# Patient Record
Sex: Female | Born: 1975
Health system: Southern US, Community
[De-identification: ages and names within clinical notes are randomized; demographics above are authoritative.]

## PROBLEM LIST (undated history)

## (undated) DIAGNOSIS — M5430 Sciatica, unspecified side: Secondary | ICD-10-CM

## (undated) DIAGNOSIS — E041 Nontoxic single thyroid nodule: Secondary | ICD-10-CM

## (undated) DIAGNOSIS — M7541 Impingement syndrome of right shoulder: Secondary | ICD-10-CM

## (undated) DIAGNOSIS — F1410 Cocaine abuse, uncomplicated: Secondary | ICD-10-CM

## (undated) DIAGNOSIS — E119 Type 2 diabetes mellitus without complications: Secondary | ICD-10-CM

## (undated) DIAGNOSIS — I4891 Unspecified atrial fibrillation: Secondary | ICD-10-CM

## (undated) DIAGNOSIS — R45851 Suicidal ideations: Secondary | ICD-10-CM

## (undated) DIAGNOSIS — N12 Tubulo-interstitial nephritis, not specified as acute or chronic: Secondary | ICD-10-CM

## (undated) DIAGNOSIS — G43909 Migraine, unspecified, not intractable, without status migrainosus: Secondary | ICD-10-CM

## (undated) DIAGNOSIS — F32A Depression, unspecified: Secondary | ICD-10-CM

## (undated) DIAGNOSIS — K219 Gastro-esophageal reflux disease without esophagitis: Secondary | ICD-10-CM

## (undated) DIAGNOSIS — I10 Essential (primary) hypertension: Secondary | ICD-10-CM

## (undated) DIAGNOSIS — E785 Hyperlipidemia, unspecified: Secondary | ICD-10-CM

## (undated) DIAGNOSIS — F419 Anxiety disorder, unspecified: Secondary | ICD-10-CM

## (undated) DIAGNOSIS — N186 End stage renal disease: Secondary | ICD-10-CM

## (undated) HISTORY — DX: Anxiety disorder, unspecified: F41.9

## (undated) HISTORY — DX: Sciatica, unspecified side: M54.30

## (undated) HISTORY — DX: Hyperlipidemia, unspecified: E78.5

## (undated) HISTORY — DX: Impingement syndrome of right shoulder: M75.41

## (undated) HISTORY — PX: TUBAL LIGATION: SHX77

## (undated) HISTORY — PX: OTHER SURGICAL HISTORY: SHX169

## (undated) HISTORY — DX: Migraine, unspecified, not intractable, without status migrainosus: G43.909

## (undated) HISTORY — DX: Essential (primary) hypertension: I10

## (undated) HISTORY — DX: Tubulo-interstitial nephritis, not specified as acute or chronic: N12

## (undated) HISTORY — DX: Depression, unspecified: F32.A

## (undated) HISTORY — DX: Gastro-esophageal reflux disease without esophagitis: K21.9

---

## 2013-05-10 DIAGNOSIS — L0501 Pilonidal cyst with abscess: Secondary | ICD-10-CM

## 2013-05-10 HISTORY — DX: Pilonidal cyst with abscess: L05.01

## 2015-03-30 ENCOUNTER — Emergency Department (HOSPITAL_COMMUNITY): Payer: Self-pay

## 2015-03-30 ENCOUNTER — Encounter (HOSPITAL_COMMUNITY): Payer: Self-pay | Admitting: Emergency Medicine

## 2015-03-30 ENCOUNTER — Emergency Department (HOSPITAL_COMMUNITY)
Admission: EM | Admit: 2015-03-30 | Discharge: 2015-03-30 | Disposition: A | Discharge status: Home / Self Care | Payer: Self-pay | Attending: Emergency Medicine | Admitting: Emergency Medicine

## 2015-03-30 DIAGNOSIS — E079 Disorder of thyroid, unspecified: Secondary | ICD-10-CM | POA: Insufficient documentation

## 2015-03-30 DIAGNOSIS — Y9289 Other specified places as the place of occurrence of the external cause: Secondary | ICD-10-CM | POA: Insufficient documentation

## 2015-03-30 DIAGNOSIS — E119 Type 2 diabetes mellitus without complications: Secondary | ICD-10-CM | POA: Insufficient documentation

## 2015-03-30 DIAGNOSIS — Y998 Other external cause status: Secondary | ICD-10-CM | POA: Insufficient documentation

## 2015-03-30 DIAGNOSIS — Z720 Tobacco use: Secondary | ICD-10-CM | POA: Insufficient documentation

## 2015-03-30 DIAGNOSIS — Z794 Long term (current) use of insulin: Secondary | ICD-10-CM | POA: Insufficient documentation

## 2015-03-30 DIAGNOSIS — S0083XA Contusion of other part of head, initial encounter: Secondary | ICD-10-CM | POA: Insufficient documentation

## 2015-03-30 DIAGNOSIS — Y9389 Activity, other specified: Secondary | ICD-10-CM | POA: Insufficient documentation

## 2015-03-30 HISTORY — DX: Type 2 diabetes mellitus without complications: E11.9

## 2015-03-30 MED ORDER — ONDANSETRON 4 MG PO TBDP
4.0000 mg | ORAL_TABLET | Freq: Once | ORAL | Status: AC
  Administered 2015-03-30: 4 mg via ORAL
  Filled 2015-03-30: qty 1

## 2015-03-30 MED ORDER — HYDROCODONE-ACETAMINOPHEN 5-325 MG PO TABS: 2.0000 | ORAL_TABLET | ORAL | Status: DC | PRN

## 2015-03-30 MED ORDER — HYDROCODONE-ACETAMINOPHEN 5-325 MG PO TABS
1.0000 | ORAL_TABLET | Freq: Once | ORAL | Status: AC
  Administered 2015-03-30: 1 via ORAL
  Filled 2015-03-30: qty 1
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
## 2015-03-30 NOTE — Discharge Instructions (Signed)
Your numbness in your face should resolve slowly over the next few weeks. Soft non-chew diet until the contusion and pain in your face resolves. Your CT scan shows a benign thyroid mass. You should have a follow-up ultrasound with your primary care physician within the next 6 months to ensure this is not a malignancy.  Contusion A contusion is a deep bruise. Contusions are the result of an injury that caused bleeding under the skin. The contusion may turn blue, purple, or yellow. Minor injuries will give you a painless contusion, but more severe contusions may stay painful and swollen for a few weeks.  CAUSES  A contusion is usually caused by a blow, trauma, or direct force to an area of the body. SYMPTOMS   Swelling and redness of the injured area.  Bruising of the injured area.  Tenderness and soreness of the injured area.  Pain. DIAGNOSIS  The diagnosis can be made by taking a history and physical exam. An X-ray, CT scan, or MRI may be needed to determine if there were any associated injuries, such as fractures. TREATMENT  Specific treatment will depend on what area of the body was injured. In general, the best treatment for a contusion is resting, icing, elevating, and applying cold compresses to the injured area. Over-the-counter medicines may also be recommended for pain control. Ask your caregiver what the best treatment is for your contusion. HOME CARE INSTRUCTIONS   Put ice on the injured area.  Put ice in a plastic bag.  Place a towel between your skin and the bag.  Leave the ice on for 15-20 minutes, 3-4 times a day, or as directed by your health care provider.  Only take over-the-counter or prescription medicines for pain, discomfort, or fever as directed by your caregiver. Your caregiver may recommend avoiding anti-inflammatory medicines (aspirin, ibuprofen, and naproxen) for 48 hours because these medicines may increase bruising.  Rest the injured area.  If possible,  elevate the injured area to reduce swelling. SEEK IMMEDIATE MEDICAL CARE IF:   You have increased bruising or swelling.  You have pain that is getting worse.  Your swelling or pain is not relieved with medicines. MAKE SURE YOU:   Understand these instructions.  Will watch your condition.  Will get help right away if you are not doing well or get worse. Document Released: 06/05/2005 Document Revised: 08/31/2013 Document Reviewed: 07/01/2011 [facility] Patient Information 2015 [place], [place]. This information is not intended to replace advice given to you by your health care provider. Make sure you discuss any questions you have with your health care provider.
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
## 2015-03-30 NOTE — ED Notes (Signed)
Pt transported to CT ?
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
## 2015-03-30 NOTE — ED Notes (Signed)
Pt has returned from CT. Denies relief of pain with vicodin.

## 2015-05-14 ENCOUNTER — Emergency Department (HOSPITAL_COMMUNITY)
Admission: EM | Admit: 2015-05-14 | Discharge: 2015-05-14 | Disposition: A | Discharge status: Home / Self Care | Payer: No Typology Code available for payment source | Attending: Emergency Medicine | Admitting: Emergency Medicine

## 2015-05-14 ENCOUNTER — Encounter (HOSPITAL_COMMUNITY): Payer: Self-pay | Admitting: Vascular Surgery

## 2015-05-14 DIAGNOSIS — W19XXXA Unspecified fall, initial encounter: Secondary | ICD-10-CM

## 2015-05-14 DIAGNOSIS — M545 Low back pain, unspecified: Secondary | ICD-10-CM

## 2015-05-14 DIAGNOSIS — S51812A Laceration without foreign body of left forearm, initial encounter: Secondary | ICD-10-CM | POA: Insufficient documentation

## 2015-05-14 DIAGNOSIS — Y998 Other external cause status: Secondary | ICD-10-CM | POA: Diagnosis not present

## 2015-05-14 DIAGNOSIS — Y9289 Other specified places as the place of occurrence of the external cause: Secondary | ICD-10-CM | POA: Diagnosis not present

## 2015-05-14 DIAGNOSIS — Y9389 Activity, other specified: Secondary | ICD-10-CM | POA: Insufficient documentation

## 2015-05-14 DIAGNOSIS — S3992XA Unspecified injury of lower back, initial encounter: Secondary | ICD-10-CM | POA: Diagnosis not present

## 2015-05-14 DIAGNOSIS — Z720 Tobacco use: Secondary | ICD-10-CM | POA: Insufficient documentation

## 2015-05-14 DIAGNOSIS — Z23 Encounter for immunization: Secondary | ICD-10-CM | POA: Diagnosis not present

## 2015-05-14 DIAGNOSIS — IMO0002 Reserved for concepts with insufficient information to code with codable children: Secondary | ICD-10-CM

## 2015-05-14 DIAGNOSIS — W010XXA Fall on same level from slipping, tripping and stumbling without subsequent striking against object, initial encounter: Secondary | ICD-10-CM | POA: Insufficient documentation

## 2015-05-14 DIAGNOSIS — E119 Type 2 diabetes mellitus without complications: Secondary | ICD-10-CM | POA: Diagnosis not present

## 2015-05-14 DIAGNOSIS — Z794 Long term (current) use of insulin: Secondary | ICD-10-CM | POA: Diagnosis not present

## 2015-05-14 LAB — I-STAT CHEM 8, ED
BUN: 8 mg/dL (ref 6–20)
CALCIUM ION: 1.13 mmol/L (ref 1.12–1.23)
Chloride: 102 mmol/L (ref 101–111)
Creatinine, Ser: 0.6 mg/dL (ref 0.44–1.00)
GLUCOSE: 157 mg/dL — AB (ref 65–99)
HCT: 40 % (ref 36.0–46.0)
HEMOGLOBIN: 13.6 g/dL (ref 12.0–15.0)
POTASSIUM: 3.8 mmol/L (ref 3.5–5.1)
Sodium: 137 mmol/L (ref 135–145)
TCO2: 24 mmol/L (ref 0–100)

## 2015-05-14 MED ORDER — SODIUM CHLORIDE 0.9 % IV BOLUS (SEPSIS)
1000.0000 mL | Freq: Once | INTRAVENOUS | Status: AC
  Administered 2015-05-14: 1000 mL via INTRAVENOUS

## 2015-05-14 MED ORDER — CYCLOBENZAPRINE HCL 10 MG PO TABS: 10.0000 mg | ORAL_TABLET | Freq: Two times a day (BID) | ORAL | Status: DC | PRN

## 2015-05-14 MED ORDER — IBUPROFEN 800 MG PO TABS: 800.0000 mg | ORAL_TABLET | Freq: Three times a day (TID) | ORAL | Status: DC

## 2015-05-14 MED ORDER — HYDROCODONE-ACETAMINOPHEN 5-325 MG PO TABS: 2.0000 | ORAL_TABLET | ORAL | Status: DC | PRN

## 2015-05-14 MED ORDER — LIDOCAINE-EPINEPHRINE (PF) 2 %-1:200000 IJ SOLN
20.0000 mL | Freq: Once | INTRAMUSCULAR | Status: AC
  Administered 2015-05-14: 20 mL via INTRADERMAL
  Filled 2015-05-14: qty 20

## 2015-05-14 MED ORDER — ONDANSETRON HCL 4 MG/2ML IJ SOLN
4.0000 mg | Freq: Once | INTRAMUSCULAR | Status: AC
  Administered 2015-05-14: 4 mg via INTRAVENOUS
  Filled 2015-05-14: qty 2

## 2015-05-14 MED ORDER — DIAZEPAM 5 MG/ML IJ SOLN
2.5000 mg | Freq: Once | INTRAMUSCULAR | Status: AC
  Administered 2015-05-14: 2.5 mg via INTRAVENOUS
  Filled 2015-05-14: qty 2

## 2015-05-14 MED ORDER — TETANUS-DIPHTH-ACELL PERTUSSIS 5-2.5-18.5 LF-MCG/0.5 IM SUSP
0.5000 mL | Freq: Once | INTRAMUSCULAR | Status: AC
  Administered 2015-05-14: 0.5 mL via INTRAMUSCULAR
  Filled 2015-05-14: qty 0.5

## 2015-05-14 MED ORDER — MORPHINE SULFATE (PF) 4 MG/ML IV SOLN
2.0000 mg | Freq: Once | INTRAVENOUS | Status: AC
  Administered 2015-05-14: 2 mg via INTRAVENOUS
  Filled 2015-05-14: qty 1

## 2015-05-14 NOTE — Discharge Instructions (Signed)
Laceration Care, Adult °A laceration is a cut or lesion that goes through all layers of the skin and into the tissue just beneath the skin. °TREATMENT  °Some lacerations may not require closure. Some lacerations may not be able to be closed due to an increased risk of infection. It is important to see your caregiver as soon as possible after an injury to minimize the risk of infection and maximize the opportunity for successful closure. °If closure is appropriate, pain medicines may be given, if needed. The wound will be cleaned to help prevent infection. Your caregiver will use stitches (sutures), staples, wound glue (adhesive), or skin adhesive strips to repair the laceration. These tools bring the skin edges together to allow for faster healing and a better cosmetic outcome. However, all wounds will heal with a scar. Once the wound has healed, scarring can be minimized by covering the wound with sunscreen during the day for 1 full year. °HOME CARE INSTRUCTIONS  °For sutures or staples: °· Keep the wound clean and dry. °· If you were given a bandage (dressing), you should change it at least once a day. Also, change the dressing if it becomes wet or dirty, or as directed by your caregiver. °· Wash the wound with soap and water 2 times a day. Rinse the wound off with water to remove all soap. Pat the wound dry with a clean towel. °· After cleaning, apply a thin layer of the antibiotic ointment as recommended by your caregiver. This will help prevent infection and keep the dressing from sticking. °· You may shower as usual after the first 24 hours. Do not soak the wound in water until the sutures are removed. °· Only take over-the-counter or prescription medicines for pain, discomfort, or fever as directed by your caregiver. °· Get your sutures or staples removed as directed by your caregiver. °For skin adhesive strips: °· Keep the wound clean and dry. °· Do not get the skin adhesive strips wet. You may bathe  carefully, using caution to keep the wound dry. °· If the wound gets wet, pat it dry with a clean towel. °· Skin adhesive strips will fall off on their own. You may trim the strips as the wound heals. Do not remove skin adhesive strips that are still stuck to the wound. They will fall off in time. °For wound adhesive: °· You may briefly wet your wound in the shower or bath. Do not soak or scrub the wound. Do not swim. Avoid periods of heavy perspiration until the skin adhesive has fallen off on its own. After showering or bathing, gently pat the wound dry with a clean towel. °· Do not apply liquid medicine, cream medicine, or ointment medicine to your wound while the skin adhesive is in place. This may loosen the film before your wound is healed. °· If a dressing is placed over the wound, be careful not to apply tape directly over the skin adhesive. This may cause the adhesive to be pulled off before the wound is healed. °· Avoid prolonged exposure to sunlight or tanning lamps while the skin adhesive is in place. Exposure to ultraviolet light in the first year will darken the scar. °· The skin adhesive will usually remain in place for 5 to 10 days, then naturally fall off the skin. Do not pick at the adhesive film. °You may need a tetanus shot if: °· You cannot remember when you had your last tetanus shot. °· You have never had a tetanus   shot. If you get a tetanus shot, your arm may swell, get red, and feel warm to the touch. This is common and not a problem. If you need a tetanus shot and you choose not to have one, there is a rare chance of getting tetanus. Sickness from tetanus can be serious. SEEK MEDICAL CARE IF:   You have redness, swelling, or increasing pain in the wound.  You see a red line that goes away from the wound.  You have yellowish-white fluid (pus) coming from the wound.  You have a fever.  You notice a bad smell coming from the wound or dressing.  Your wound breaks open before or  after sutures have been removed.  You notice something coming out of the wound such as wood or glass.  Your wound is on your hand or foot and you cannot move a finger or toe. SEEK IMMEDIATE MEDICAL CARE IF:   Your pain is not controlled with prescribed medicine.  You have severe swelling around the wound causing pain and numbness or a change in color in your arm, hand, leg, or foot.  Your wound splits open and starts bleeding.  You have worsening numbness, weakness, or loss of function of any joint around or beyond the wound.  You develop painful lumps near the wound or on the skin anywhere on your body. MAKE SURE YOU:   Understand these instructions.  Will watch your condition.  Will get help right away if you are not doing well or get worse. Document Released: 08/26/2005 Document Revised: 11/18/2011 Document Reviewed: 02/19/2011 [facility] Patient Information 2015 [place], [place]. This information is not intended to replace advice given to you by your health care provider. Make sure you discuss any questions you have with your health care provider.  Back Pain, Adult Low back pain is very common. About 1 in 5 people have back pain.The cause of low back pain is rarely dangerous. The pain often gets better over time.About half of people with a sudden onset of back pain feel better in just 2 weeks. About 8 in 10 people feel better by 6 weeks.  CAUSES Some common causes of back pain include:  Strain of the muscles or ligaments supporting the spine.  Wear and tear (degeneration) of the spinal discs.  Arthritis.  Direct injury to the back. DIAGNOSIS Most of the time, the direct cause of low back pain is not known.However, back pain can be treated effectively even when the exact cause of the pain is unknown.Answering your caregiver's questions about your overall health and symptoms is one of the most accurate ways to make sure the cause of your pain is not dangerous. If your  caregiver needs more information, he or she may order lab work or imaging tests (X-rays or MRIs).However, even if imaging tests show changes in your back, this usually does not require surgery. HOME CARE INSTRUCTIONS For many people, back pain returns.Since low back pain is rarely dangerous, it is often a condition that people can learn to [facility] their own.   Remain active. It is stressful on the back to sit or stand in one place. Do not sit, drive, or stand in one place for more than 30 minutes at a time. Take short walks on level surfaces as soon as pain allows.Try to increase the length of time you walk each day.  Do not stay in bed.Resting more than 1 or 2 days can delay your recovery.  Do not avoid exercise or work.Your body  is made to move.It is not dangerous to be active, even though your back may hurt.Your back will likely heal faster if you return to being active before your pain is gone.  Pay attention to your body when you bend and lift. Many people have less discomfortwhen lifting if they bend their knees, keep the load close to their bodies,and avoid twisting. Often, the most comfortable positions are those that put less stress on your recovering back.  Find a comfortable position to sleep. Use a firm mattress and lie on your side with your knees slightly bent. If you lie on your back, put a pillow under your knees.  Only take over-the-counter or prescription medicines as directed by your caregiver. Over-the-counter medicines to reduce pain and inflammation are often the most helpful.Your caregiver may prescribe muscle relaxant drugs.These medicines help dull your pain so you can more quickly return to your normal activities and healthy exercise.  Put ice on the injured area.  Put ice in a plastic bag.  Place a towel between your skin and the bag.  Leave the ice on for 15-20 minutes, 03-04 times a day for the first 2 to 3 days. After that, ice and heat may be  alternated to reduce pain and spasms.  Ask your caregiver about trying back exercises and gentle massage. This may be of some benefit.  Avoid feeling anxious or stressed.Stress increases muscle tension and can worsen back pain.It is important to recognize when you are anxious or stressed and learn ways to manage it.Exercise is a great option. SEEK MEDICAL CARE IF:  You have pain that is not relieved with rest or medicine.  You have pain that does not improve in 1 week.  You have new symptoms.  You are generally not feeling well. SEEK IMMEDIATE MEDICAL CARE IF:   You have pain that radiates from your back into your legs.  You develop new bowel or bladder control problems.  You have unusual weakness or numbness in your arms or legs.  You develop nausea or vomiting.  You develop abdominal pain.  You feel faint. Document Released: 08/26/2005 Document Revised: 02/25/2012 Document Reviewed: 12/28/2013 [facility] Patient Information 2015 [place], [place]. This information is not intended to replace advice given to you by your health care provider. Make sure you discuss any questions you have with your health care provider.
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
## 2015-05-14 NOTE — ED Provider Notes (Signed)
Medical screening examination/treatment/procedure(s) were conducted as a shared visit with non-physician practitioner(s) and myself.  I personally evaluated the patient during the encounter.   EKG Interpretation None      39 yo female who presents after a fall.  She sustained a laceration to her left forearm.  On exam, well appearing, nontoxic, not distressed, normal respiratory effort, normal perfusion, V-shaped laceration to left anterior proximal forearm without active bleeding. Neurovascularly intact distally. Laceration repaired by PA Angelica Chessman.    Clinical Impression: 1. Fall, initial encounter   2. Right-sided low back pain without sciatica   3. Laceration       Blake Divine, MD 05/15/15 0120

## 2015-05-14 NOTE — ED Provider Notes (Signed)
CSN: [id number]     Arrival date & time 05/14/15  1639 History   First MD Initiated Contact with Patient 05/14/15 1648     Chief Complaint  Patient presents with  . Fall  . Laceration     (Consider location/radiation/quality/duration/timing/severity/associated sxs/prior Treatment) HPI  Patient is a 39 year old female with insulin-dependent diabetes, otherwise healthy, who slipped and fell on water in the aile at [employer].  She struck her bottom, with her right leg stuck behind her and sustained a laceration/skin tear to her left forearm in the process of falling. She denies striking her head or loss of consciousness.  She was brought in by EMS. The bleeding of her left forearm was controlled, and she is complaining of pain to her left forearm and to her right lower back and buttock.  She denies any numbness, tingling, weakness, shortness of breath, chest pain, or any other complaints at this time. She denies any bowel or bladder incontinence. She states she has managed her diabetes with insulin for possibly 7 years, her sugars run around 140. She has not eaten yet today however does not feel jittery, weak or confused at this time.  She does not recall her last tetanus booster.    Past Medical History  Diagnosis Date  . Diabetes mellitus without complication    History reviewed. No pertinent past surgical history. No family history on file. Social History  Substance Use Topics  . Smoking status: Current Every Day Smoker -- 1.00 packs/day    Types: Cigarettes  . Smokeless tobacco: None  . Alcohol Use: Yes     Comment: occasionally   OB History    No data available     Review of Systems  Constitutional: Negative.   HENT: Negative.   Eyes: Negative.   Respiratory: Negative.   Cardiovascular: Negative.   Gastrointestinal: Negative.   Genitourinary: Negative.   Musculoskeletal: Positive for back pain. Negative for neck pain and neck stiffness.  Skin: Positive for wound. Negative  for color change and pallor.  Neurological: Negative.   Hematological: Negative.   Psychiatric/Behavioral: Negative.     Allergies  Review of patient's allergies indicates no known allergies.  Home Medications   Prior to Admission medications   Medication Sig Start Date End Date Taking? Authorizing Provider  cyclobenzaprine (FLEXERIL) 10 MG tablet Take 1 tablet (10 mg total) by mouth 2 (two) times daily as needed for muscle spasms. 05/14/15   Danelle Berry, PA-C  HYDROcodone-acetaminophen (NORCO/VICODIN) 5-325 MG per tablet Take 2 tablets by mouth every 4 (four) hours as needed. 05/14/15   Danelle Berry, PA-C  ibuprofen (ADVIL,MOTRIN) 800 MG tablet Take 1 tablet (800 mg total) by mouth 3 (three) times daily. 05/14/15   Danelle Berry, PA-C  insulin aspart protamine- aspart (NOVOLOG MIX 70/30) (70-30) 100 UNIT/ML injection Inject into the skin.    Historical Provider, MD   BP 114/45 mmHg  Pulse 85  Temp(Src) 98.9 F (37.2 C) (Oral)  Resp 16  SpO2 98%  LMP 05/05/2015 Physical Exam  Constitutional: She is oriented to person, place, and time. Vital signs are normal. She appears well-developed and well-nourished. She is cooperative. She does not have a sickly appearance. No distress.  HENT:  Head: Normocephalic and atraumatic.  Right Ear: External ear normal.  Left Ear: External ear normal.  Nose: Nose normal.  Mouth/Throat: Oropharynx is clear and moist. No oropharyngeal exudate.  Eyes: Conjunctivae, EOM and lids are normal. Pupils are equal, round, and reactive to light. Right eye  exhibits no discharge. Left eye exhibits no discharge. No scleral icterus.  Neck: Normal range of motion. Neck supple. No JVD present. No tracheal deviation present. No thyromegaly present.  Cardiovascular: Normal rate, regular rhythm, normal heart sounds and intact distal pulses.  Exam reveals no gallop and no friction rub.   No murmur heard. Pulses:      Radial pulses are 2+ on the right side, and 2+ on the left  side.       Dorsalis pedis pulses are 2+ on the right side, and 2+ on the left side.  Pulmonary/Chest: Effort normal and breath sounds normal. No stridor. No respiratory distress. She has no wheezes. She has no rales. She exhibits no tenderness.  Abdominal: Soft. Bowel sounds are normal. She exhibits no distension and no mass. There is no tenderness. There is no rebound and no guarding.  Musculoskeletal: Normal range of motion. She exhibits tenderness. She exhibits no edema.       Cervical back: Normal.       Thoracic back: Normal.       Lumbar back: She exhibits normal range of motion, no tenderness, no bony tenderness, no swelling and no edema.  No tenderness to palpation on spinal processes from cervical to lumbar spine, ttp over right lumbar paraspinal muscles  Lymphadenopathy:    She has no cervical adenopathy.  Neurological: She is alert and oriented to person, place, and time. She has normal reflexes. No cranial nerve deficit. She exhibits normal muscle tone. Coordination normal.  Normal sensation to light touch throughout all extremities, normal strength to lower extremities with dorsiflexion and plantarflexion, cranial nerves grossly intact  Skin: Skin is warm and dry. Laceration noted. No rash noted. She is not diaphoretic. No cyanosis or erythema. No pallor. Nails show no clubbing.     Psychiatric: She has a normal mood and affect. Her behavior is normal. Judgment and thought content normal.  Nursing note and vitals reviewed.   ED Course  Procedures (including critical care time) Labs Review Labs Reviewed  I-STAT CHEM 8, ED - Abnormal; Notable for the following:    Glucose, Bld 157 (*)    All other components within normal limits   LACERATION REPAIR Performed by: Danelle Berry Consent: Verbal consent obtained. Risks and benefits: risks, benefits and alternatives were discussed Patient identity confirmed: provided demographic data Time out performed prior to  procedure Prepped and Draped in normal sterile fashion Wound explored Laceration Location: left forearm Laceration Length: 8 cm No Foreign Bodies seen or palpated, wound approx 1 cm deep, no tendon, facial, nerve or vascular injury visualized in a bloodless field. Anesthesia: local infiltration Local anesthetic: lidocaine 2% with epinephrine Anesthetic total: 8 ml Irrigation method: syringe Amount of cleaning: irrigation with 200 mL sterile saline Skin closure: complex - 4.0 chromic gut and 4.0 prolene Number of sutures or staples/Technique: 3 chromic gut simple interrupted deep sutures to approximate gaping adipose tissue + buried running subcuticular.  10 simple interrupted 4.0 prolene. Patient tolerance: Patient tolerated the procedure well with no immediate complications.   Imaging Review No results found. I have personally reviewed and evaluated these images and lab results as part of my medical decision-making.   EKG Interpretation None      MDM   Final diagnoses:  Fall, initial encounter  Right-sided low back pain without sciatica  Laceration    Patient with mechanical slip and fall, presents with right-sided low back pain and laceration to her left forearm Laceration is gaping with exposure  of adipose tissue, most proximal point of laceration has thin skin tear.  The wound was explored and irrigated, no visible nerve, vascular or connective tissue involvement.  Pt had no neurovascular deficit in LUE.  Multilayer closure with chromic gut and vicryl resulted in well approximated wound edges.  Pt received pain meds and valium for muscle spasm in her back/buttock.  She did not know her last tetanus booster, was administered Tdap  Pt was discharged with NSAID, muscle relaxer and few pain meds for back pain s/p fall.  She was given suture care instructions, return precautions, and will f/up in the ED for wound check/suture removal in 5-7 days.    Medications  sodium chloride  0.9 % bolus 1,000 mL (0 mLs Intravenous Stopped 05/14/15 2011)  ondansetron (ZOFRAN) injection 4 mg (4 mg Intravenous Given 05/14/15 1758)  diazepam (VALIUM) injection 2.5 mg (2.5 mg Intravenous Given 05/14/15 1758)  morphine 4 MG/ML injection 2 mg (2 mg Intravenous Given 05/14/15 1758)  lidocaine-EPINEPHrine (XYLOCAINE W/EPI) 2 %-1:200000 (PF) injection 20 mL (20 mLs Intradermal Given by Other 05/14/15 1803)  Tdap (BOOSTRIX) injection 0.5 mL (0.5 mLs Intramuscular Given 05/14/15 2003)   Filed Vitals:   05/14/15 1930 05/14/15 1945 05/14/15 2000 05/14/15 2012  BP: 121/56 121/64 117/59 114/45  Pulse: 82 82 85 85  Temp:      TempSrc:      Resp:  16  16  SpO2: 100% 99% 96% 98%     Danelle Berry, PA-C 05/14/15 2244  Blake Divine, MD 05/15/15 1610

## 2015-05-21 ENCOUNTER — Encounter (HOSPITAL_COMMUNITY): Payer: Self-pay | Admitting: Emergency Medicine

## 2015-05-21 ENCOUNTER — Emergency Department (HOSPITAL_COMMUNITY)
Admission: EM | Admit: 2015-05-21 | Discharge: 2015-05-21 | Disposition: A | Discharge status: Home / Self Care | Payer: Self-pay | Attending: Emergency Medicine | Admitting: Emergency Medicine

## 2015-05-21 DIAGNOSIS — E119 Type 2 diabetes mellitus without complications: Secondary | ICD-10-CM | POA: Insufficient documentation

## 2015-05-21 DIAGNOSIS — Z720 Tobacco use: Secondary | ICD-10-CM | POA: Insufficient documentation

## 2015-05-21 DIAGNOSIS — Z794 Long term (current) use of insulin: Secondary | ICD-10-CM | POA: Insufficient documentation

## 2015-05-21 DIAGNOSIS — Z4802 Encounter for removal of sutures: Secondary | ICD-10-CM | POA: Insufficient documentation

## 2015-05-21 DIAGNOSIS — Z791 Long term (current) use of non-steroidal anti-inflammatories (NSAID): Secondary | ICD-10-CM | POA: Insufficient documentation

## 2015-05-21 NOTE — ED Notes (Signed)
Left arm suture removal.

## 2015-05-21 NOTE — ED Provider Notes (Signed)
History  This chart was scribed for non-physician practitioner, Arthor Captain, PA-C,working with Richardean Canal, MD, by Karle Plumber, ED Scribe. This patient was seen in room TR07C/TR07C and the patient's care was started at 11:40 AM.  Chief Complaint  Patient presents with  . Suture / Staple Removal   The history is provided by the patient and medical records. No language interpreter was used.    HPI Comments:  Stacey Lucas is a 39 y.o. female who presents to the Emergency Department needing 10 sutures removed that were placed seven days ago. She reports some mild swelling of the area. She has not been treating the wound with anything. She denies fever, chills, nausea, vomiting, bleeding or drainage from the site. PMHx of DM.  Past Medical History  Diagnosis Date  . Diabetes mellitus without complication    History reviewed. No pertinent past surgical history. History reviewed. No pertinent family history. Social History  Substance Use Topics  . Smoking status: Current Every Day Smoker -- 1.00 packs/day    Types: Cigarettes  . Smokeless tobacco: None  . Alcohol Use: Yes     Comment: occasionally   OB History    No data available     Review of Systems  Constitutional: Negative for fever.  Musculoskeletal: Negative for joint swelling.  Skin: Positive for wound. Negative for rash.    Allergies  Review of patient's allergies indicates no known allergies.  Home Medications   Prior to Admission medications   Medication Sig Start Date End Date Taking? Authorizing Provider  cyclobenzaprine (FLEXERIL) 10 MG tablet Take 1 tablet (10 mg total) by mouth 2 (two) times daily as needed for muscle spasms. 05/14/15   Danelle Berry, PA-C  HYDROcodone-acetaminophen (NORCO/VICODIN) 5-325 MG per tablet Take 2 tablets by mouth every 4 (four) hours as needed. 05/14/15   Danelle Berry, PA-C  ibuprofen (ADVIL,MOTRIN) 800 MG tablet Take 1 tablet (800 mg total) by mouth 3 (three) times daily. 05/14/15    Danelle Berry, PA-C  insulin aspart protamine- aspart (NOVOLOG MIX 70/30) (70-30) 100 UNIT/ML injection Inject into the skin.    Historical Provider, MD   Triage Vitals: BP 123/68 mmHg  Pulse 90  Temp(Src) 98.7 F (37.1 C) (Oral)  Resp 16  SpO2 100%  LMP 05/05/2015 Physical Exam  Constitutional: She is oriented to person, place, and time. She appears well-developed and well-nourished.  HENT:  Head: Normocephalic and atraumatic.  Eyes: EOM are normal.  Neck: Normal range of motion.  Cardiovascular: Normal rate.   Pulmonary/Chest: Effort normal.  Musculoskeletal: Normal range of motion.  Neurological: She is alert and oriented to person, place, and time.  Skin: Skin is warm and dry.  Psychiatric: She has a normal mood and affect. Her behavior is normal.  Nursing note and vitals reviewed.   ED Course  Procedures (including critical care time) DIAGNOSTIC STUDIES: Oxygen Saturation is 100% on RA, normal by my interpretation.   COORDINATION OF CARE: 11:43 AM- Will remove sutures. Pt verbalizes understanding and agrees to plan.  Medications - No data to display   MDM   Final diagnoses:  Visit for suture removal    Staple removal   Pt to ER for staple/suture removal and wound check as above. Procedure tolerated well. Vitals normal, no signs of infection. Scar minimization & return precautions given at dc.    I personally performed the services described in this documentation, which was scribed in my presence. The recorded information has been reviewed and is accurate.  Arthor Captain, PA-C 05/23/15 1608  Richardean Canal, MD 05/24/15 1556

## 2015-05-21 NOTE — Discharge Instructions (Signed)
Suture Removal, Care After °Refer to this sheet in the next few weeks. These instructions provide you with information on caring for yourself after your procedure. Your health care provider may also give you more specific instructions. Your treatment has been planned according to current medical practices, but problems sometimes occur. Call your health care provider if you have any problems or questions after your procedure. °WHAT TO EXPECT AFTER THE PROCEDURE °After your stitches (sutures) are removed, it is typical to have the following: °· Some discomfort and swelling in the wound area. °· Slight redness in the area. °HOME CARE INSTRUCTIONS  °· If you have skin adhesive strips over the wound area, do not take the strips off. They will fall off on their own in a few days. If the strips remain in place after 14 days, you may remove them. °· Change any bandages (dressings) at least once a day or as directed by your health care provider. If the bandage sticks, soak it off with warm, soapy water. °· Apply cream or ointment only as directed by your health care provider. If using cream or ointment, wash the area with soap and water 2 times a day to remove all the cream or ointment. Rinse off the soap and pat the area dry with a clean towel. °· Keep the wound area dry and clean. If the bandage becomes wet or dirty, or if it develops a bad smell, change it as soon as possible. °· Continue to protect the wound from injury. °· Use sunscreen when out in the sun. New scars become sunburned easily. °SEEK MEDICAL CARE IF: °· You have increasing redness, swelling, or pain in the wound. °· You see pus coming from the wound. °· You have a fever. °· You notice a bad smell coming from the wound or dressing. °· Your wound breaks open (edges not staying together). °Document Released: 05/21/2001 Document Revised: 06/16/2013 Document Reviewed: 04/07/2013 °Exit[employer]® Patient Information ©2015 [employer]. This information is not  intended to replace advice given to you by your health care provider. Make sure you discuss any questions you have with your health care provider. ° °Staple Removal, Care After °The staples that were used to close your skin have been removed. The care described here will need to continue until the wound is completely healed and your health care provider confirms that wound care can be stopped. °HOME CARE INSTRUCTIONS  °· Keep the wound site dry and clean. Do not soak it in water. °· If skin adhesive strips were applied after the staples were removed, they will begin to peel off in a few days. Allow them to remain in place until they fall off on their own. °· If you still have a bandage (dressing), change it at least once a day or as directed by your health care provider. If the dressing sticks, pour warm, sterile water over it until it loosens and can be removed without pulling apart the wound edges. Pat dry with a clean towel. °· Apply cream or ointment that stops the growth of bacteria (antibacterial cream or ointment) only if your health care provider has directed you to do so. Place a nonstick bandage over the wound to prevent the dressing from sticking. °· Cover the nonstick bandage with a new dressing as directed by your health care provider. °· If the bandage becomes wet, dirty, or develops a bad smell, change it as soon as possible. °· New scars become sunburned easily. Use sunscreens with a sun protection   factor (SPF) of at least 15 when out in the sun. Reapply the SPF every 2 hours.  Only take medicines as directed by your health care provider. SEEK IMMEDIATE MEDICAL CARE IF:   You have redness, swelling, or increasing pain in the wound.  You have pus coming from the wound.  You have a fever.  You notice a bad smell coming from the wound or dressing.  Your wound edges open up after staples have been removed. MAKE SURE YOU:   Understand these instructions.  Will watch your  condition.  Will get help right away if you are not doing well or get worse. Document Released: 08/08/2008 Document Revised: 08/31/2013 Document Reviewed: 08/08/2008 [facility] Patient Information 2015 [place], [place]. This information is not intended to replace advice given to you by your health care provider. Make sure you discuss any questions you have with your health care provider.

## 2016-03-24 ENCOUNTER — Emergency Department (HOSPITAL_COMMUNITY)
Admission: EM | Admit: 2016-03-24 | Discharge: 2016-03-24 | Disposition: A | Discharge status: Home / Self Care | Payer: No Typology Code available for payment source | Attending: Emergency Medicine | Admitting: Emergency Medicine

## 2016-03-24 ENCOUNTER — Encounter (HOSPITAL_COMMUNITY): Payer: Self-pay | Admitting: Emergency Medicine

## 2016-03-24 DIAGNOSIS — Y939 Activity, unspecified: Secondary | ICD-10-CM | POA: Insufficient documentation

## 2016-03-24 DIAGNOSIS — E1165 Type 2 diabetes mellitus with hyperglycemia: Secondary | ICD-10-CM | POA: Insufficient documentation

## 2016-03-24 DIAGNOSIS — X58XXXA Exposure to other specified factors, initial encounter: Secondary | ICD-10-CM | POA: Insufficient documentation

## 2016-03-24 DIAGNOSIS — Z794 Long term (current) use of insulin: Secondary | ICD-10-CM | POA: Insufficient documentation

## 2016-03-24 DIAGNOSIS — S90821A Blister (nonthermal), right foot, initial encounter: Secondary | ICD-10-CM | POA: Insufficient documentation

## 2016-03-24 DIAGNOSIS — Y929 Unspecified place or not applicable: Secondary | ICD-10-CM | POA: Insufficient documentation

## 2016-03-24 DIAGNOSIS — Y999 Unspecified external cause status: Secondary | ICD-10-CM | POA: Insufficient documentation

## 2016-03-24 DIAGNOSIS — R739 Hyperglycemia, unspecified: Secondary | ICD-10-CM

## 2016-03-24 DIAGNOSIS — Z791 Long term (current) use of non-steroidal anti-inflammatories (NSAID): Secondary | ICD-10-CM | POA: Insufficient documentation

## 2016-03-24 DIAGNOSIS — Z79899 Other long term (current) drug therapy: Secondary | ICD-10-CM | POA: Insufficient documentation

## 2016-03-24 DIAGNOSIS — F17210 Nicotine dependence, cigarettes, uncomplicated: Secondary | ICD-10-CM | POA: Insufficient documentation

## 2016-03-24 LAB — CBG MONITORING, ED: Glucose-Capillary: 183 mg/dL — ABNORMAL HIGH (ref 65–99)

## 2016-03-24 NOTE — ED Notes (Signed)
Discharge instructions and follow up care reviewed with patient. Patient verbalized understanding. 

## 2016-03-24 NOTE — ED Notes (Addendum)
Pt has a blister on the back of her right ankle that isn't healing, states yellow exudate comes from it. Is a diabetic, no redness around area, doesn't appear to be obviously swollen, no drainage observed in triage. States she's supposed to be on 70/30 insulin, but doesn't check her CBG or manage her DM d/t the cost

## 2016-03-24 NOTE — ED Provider Notes (Signed)
CSN: [id number]     Arrival date & time 03/24/16  1044 History   First MD Initiated Contact with Patient 03/24/16 1104     Chief Complaint  Patient presents with  . Blister     HPI  Pt presents with a blister to her right heel.  H.o IDDM, off meds for many months 2/2 finances.  Blister for 2 weeks after being rubbed with her shoe.  No purulent drainage. No erythema or swelling.  Past Medical History  Diagnosis Date  . Diabetes mellitus without complication (HCC)    History reviewed. No pertinent past surgical history. History reviewed. No pertinent family history. Social History  Substance Use Topics  . Smoking status: Current Every Day Smoker -- 1.00 packs/day    Types: Cigarettes  . Smokeless tobacco: None  . Alcohol Use: Yes     Comment: occasionally   OB History    No data available     Review of Systems  Constitutional: Negative for fever, chills, diaphoresis, appetite change and fatigue.  HENT: Negative for mouth sores, sore throat and trouble swallowing.   Eyes: Negative for visual disturbance.  Respiratory: Negative for cough, chest tightness, shortness of breath and wheezing.   Cardiovascular: Negative for chest pain.  Gastrointestinal: Negative for nausea, vomiting, abdominal pain, diarrhea and abdominal distention.  Endocrine: Positive for polyuria.  Genitourinary: Negative for dysuria, frequency and hematuria.  Musculoskeletal: Negative for gait problem.  Skin: Positive for wound. Negative for color change, pallor and rash.  Neurological: Negative for dizziness, syncope, light-headedness and headaches.  Hematological: Does not bruise/bleed easily.  Psychiatric/Behavioral: Negative for behavioral problems and confusion.      Allergies  Review of patient's allergies indicates no known allergies.  Home Medications   Prior to Admission medications   Medication Sig Start Date End Date Taking? Authorizing Provider  cyclobenzaprine (FLEXERIL) 10 MG tablet  Take 1 tablet (10 mg total) by mouth 2 (two) times daily as needed for muscle spasms. 05/14/15   Danelle BerryLeisa Tapia, PA-C  HYDROcodone-acetaminophen (NORCO/VICODIN) 5-325 MG per tablet Take 2 tablets by mouth every 4 (four) hours as needed. 05/14/15   Danelle BerryLeisa Tapia, PA-C  ibuprofen (ADVIL,MOTRIN) 800 MG tablet Take 1 tablet (800 mg total) by mouth 3 (three) times daily. 05/14/15   Danelle BerryLeisa Tapia, PA-C  insulin aspart protamine- aspart (NOVOLOG MIX 70/30) (70-30) 100 UNIT/ML injection Inject into the skin.    Historical Provider, MD   BP 152/82 mmHg  Pulse 93  Temp(Src) 98.3 F (36.8 C) (Oral)  Resp 14  Ht 5\' 5"  (1.651 m)  Wt 160 lb (72.576 kg)  BMI 26.63 kg/m2  SpO2 100% Physical Exam  Constitutional: She is oriented to person, place, and time. She appears well-developed and well-nourished. No distress.  HENT:  Head: Normocephalic.  Eyes: Conjunctivae are normal. Pupils are equal, round, and reactive to light. No scleral icterus.  Neck: Normal range of motion. Neck supple. No thyromegaly present.  Cardiovascular: Normal rate and regular rhythm.  Exam reveals no gallop and no friction rub.   No murmur heard. Pulmonary/Chest: Effort normal and breath sounds normal. No respiratory distress. She has no wheezes. She has no rales.  Abdominal: Soft. Bowel sounds are normal. She exhibits no distension. There is no tenderness. There is no rebound.  Musculoskeletal: Normal range of motion.  Neurological: She is alert and oriented to person, place, and time.  Skin: Skin is warm and dry. No rash noted.     Psychiatric: She has a normal mood and  affect. Her behavior is normal.    ED Course  Procedures (including critical care time) Labs Review Labs Reviewed  CBG MONITORING, ED - Abnormal; Notable for the following:    Glucose-Capillary 183 (*)    All other components within normal limits    Imaging Review No results found. I have personally reviewed and evaluated these images and lab results as part of  my medical decision-making.   EKG Interpretation None      MDM   Final diagnoses:  Blister of foot, right, initial encounter  Hyperglycemia    Blister covered.  CBG 183.  [facility]erred to [facility]: orange card program.  I left a message with Care Manager.    Rolland Porter, MD 03/24/16 [phone]

## 2016-03-24 NOTE — ED Notes (Signed)
MD at bedside. 

## 2016-03-24 NOTE — Discharge Instructions (Signed)
Call [facility] to discuss [facility]. You should receive a cal from Case Manager regarding [employer], and if you would qualify. Use moleskin, available over-the-counter, to protect your skin and the area of the blister.   Blisters A blister is a fluid-filled sac that forms between layers of skin. Blisters often form in areas where skin rubs against other skin or rubs against something else. The most common areas for blisters are the hands and feet. CAUSES A blister can be caused by:  An injury.  A burn.  An allergic reaction.  An infection.  Exposure to irritating chemicals.  Friction. Friction blisters often result from:  Sports.  Repetitive activities.  Shoes that are too tight or too loose. SIGNS AND SYMPTOMS A blister is often round and looks like a bump. It may itch or be painful to the touch. The liquid in a blister is clear or bloody. Before a blister forms, the skin may become red, feel warm, itch, or be painful to the touch. DIAGNOSIS A blister can usually be diagnosed from its appearance. TREATMENT Treatment involves protecting the area where the blister has formed until the skin has healed. If something is likely to rub against the blister, apply a bandage (dressing) with a hole in the middle over the blister. Most blisters break open, dry up, and go away on their own within 10 days. Rarely, blisters that are very painful may be drained before they break open on their own. Draining of a blister should only be done by a health care provider under sterile conditions. HOME CARE INSTRUCTIONS  Protect the area where the blister has formed as directed by your health care provider.  Do not open or pop your blister, because it could become infected.  If the blister is very painful, ask your health care provider whether you should have it drained.  If the blister breaks open on its own:  Do not remove the loose skin that is over the  blister.  Wash the blister area with soap and water every day.  After washing the blister area, you may apply an antibiotic cream or ointment and cover the area with a bandage. PREVENTION Taking these steps can help to prevent blisters that are caused by friction:  Wear comfortable shoes that fit well.  Always wear socks with shoes.  Wear extra socks or use tape, bandages, or pads over blister-prone areas as needed.  Wear protective gear, such as gloves, when participating in sports or activities that can cause blisters.  Use powders as needed to keep your feet dry. SEEK MEDICAL CARE IF:  You have increased redness, swelling, or pain in the blister area.  A puslike discharge is coming from the blister area.  You have a fever.  You have chills.   This information is not intended to replace advice given to you by your health care provider. Make sure you discuss any questions you have with your health care provider.   Document Released: 10/03/2004 Document Revised: 09/16/2014 Document Reviewed: 03/26/2014 Elsevier Interactive Patient Education 2016 Elsevier Inc.  Hyperglycemia Hyperglycemia occurs when the glucose (sugar) in your blood is too high. Hyperglycemia can happen for many reasons, but it most often happens to people who do not know they have diabetes or are not managing their diabetes properly.  CAUSES  Whether you have diabetes or not, there are other causes of hyperglycemia. Hyperglycemia can occur when you have diabetes, but it can also occur in other situations that  you might not be as aware of, such as: Diabetes  If you have diabetes and are having problems controlling your blood glucose, hyperglycemia could occur because of some of the following reasons:  Not following your meal plan.  Not taking your diabetes medications or not taking it properly.  Exercising less or doing less activity than you normally do.  Being sick. Pre-diabetes  This cannot be  ignored. Before people develop Type 2 diabetes, they almost always have "pre-diabetes." This is when your blood glucose levels are higher than normal, but not yet high enough to be diagnosed as diabetes. Research has shown that some long-term damage to the body, especially the heart and circulatory system, may already be occurring during pre-diabetes. If you take action to manage your blood glucose when you have pre-diabetes, you may delay or prevent Type 2 diabetes from developing. Stress  If you have diabetes, you may be "diet" controlled or on oral medications or insulin to control your diabetes. However, you may find that your blood glucose is higher than usual in the hospital whether you have diabetes or not. This is often referred to as "stress hyperglycemia." Stress can elevate your blood glucose. This happens because of hormones put out by the body during times of stress. If stress has been the cause of your high blood glucose, it can be followed regularly by your caregiver. That way he/she can make sure your hyperglycemia does not continue to get worse or progress to diabetes. Steroids  Steroids are medications that act on the infection fighting system (immune system) to block inflammation or infection. One side effect can be a rise in blood glucose. Most people can produce enough extra insulin to allow for this rise, but for those who cannot, steroids make blood glucose levels go even higher. It is not unusual for steroid treatments to "uncover" diabetes that is developing. It is not always possible to determine if the hyperglycemia will go away after the steroids are stopped. A special blood test called an A1c is sometimes done to determine if your blood glucose was elevated before the steroids were started. SYMPTOMS  Thirsty.  Frequent urination.  Dry mouth.  Blurred vision.  Tired or fatigue.  Weakness.  Sleepy.  Tingling in feet or leg. DIAGNOSIS  Diagnosis is made by  monitoring blood glucose in one or all of the following ways:  A1c test. This is a chemical found in your blood.  Fingerstick blood glucose monitoring.  Laboratory results. TREATMENT  First, knowing the cause of the hyperglycemia is important before the hyperglycemia can be treated. Treatment may include, but is not be limited to:  Education.  Change or adjustment in medications.  Change or adjustment in meal plan.  Treatment for an illness, infection, etc.  More frequent blood glucose monitoring.  Change in exercise plan.  Decreasing or stopping steroids.  Lifestyle changes. HOME CARE INSTRUCTIONS   Test your blood glucose as directed.  Exercise regularly. Your caregiver will give you instructions about exercise. Pre-diabetes or diabetes which comes on with stress is helped by exercising.  Eat wholesome, balanced meals. Eat often and at regular, fixed times. Your caregiver or nutritionist will give you a meal plan to guide your sugar intake.  Being at an ideal weight is important. If needed, losing as little as 10 to 15 pounds may help improve blood glucose levels. SEEK MEDICAL CARE IF:   You have questions about medicine, activity, or diet.  You continue to have symptoms (problems such  as increased thirst, urination, or weight gain). SEEK IMMEDIATE MEDICAL CARE IF:   You are vomiting or have diarrhea.  Your breath smells fruity.  You are breathing faster or slower.  You are very sleepy or incoherent.  You have numbness, tingling, or pain in your feet or hands.  You have chest pain.  Your symptoms get worse even though you have been following your caregiver's orders.  If you have any other questions or concerns.   This information is not intended to replace advice given to you by your health care provider. Make sure you discuss any questions you have with your health care provider.   Document Released: 02/19/2001 Document Revised: 11/18/2011 Document  Reviewed: 05/02/2015 Elsevier Interactive Patient Education [employer]2016 Elsevier Inc.

## 2016-03-29 ENCOUNTER — Ambulatory Visit: Payer: Self-pay

## 2016-04-05 ENCOUNTER — Ambulatory Visit: Payer: Self-pay

## 2016-06-08 ENCOUNTER — Emergency Department (HOSPITAL_COMMUNITY)
Admission: EM | Admit: 2016-06-08 | Discharge: 2016-06-08 | Disposition: A | Discharge status: Home / Self Care | Payer: Self-pay | Attending: Emergency Medicine | Admitting: Emergency Medicine

## 2016-06-08 ENCOUNTER — Emergency Department (HOSPITAL_COMMUNITY): Payer: Self-pay

## 2016-06-08 ENCOUNTER — Encounter (HOSPITAL_COMMUNITY): Payer: Self-pay | Admitting: Physical Medicine and Rehabilitation

## 2016-06-08 DIAGNOSIS — Y939 Activity, unspecified: Secondary | ICD-10-CM | POA: Insufficient documentation

## 2016-06-08 DIAGNOSIS — Y999 Unspecified external cause status: Secondary | ICD-10-CM | POA: Insufficient documentation

## 2016-06-08 DIAGNOSIS — E119 Type 2 diabetes mellitus without complications: Secondary | ICD-10-CM | POA: Insufficient documentation

## 2016-06-08 DIAGNOSIS — M79674 Pain in right toe(s): Secondary | ICD-10-CM | POA: Insufficient documentation

## 2016-06-08 DIAGNOSIS — F17210 Nicotine dependence, cigarettes, uncomplicated: Secondary | ICD-10-CM | POA: Insufficient documentation

## 2016-06-08 DIAGNOSIS — W228XXA Striking against or struck by other objects, initial encounter: Secondary | ICD-10-CM | POA: Insufficient documentation

## 2016-06-08 DIAGNOSIS — Y929 Unspecified place or not applicable: Secondary | ICD-10-CM | POA: Insufficient documentation

## 2016-06-08 DIAGNOSIS — Z794 Long term (current) use of insulin: Secondary | ICD-10-CM | POA: Insufficient documentation

## 2016-06-08 NOTE — ED Triage Notes (Signed)
Pt states she struck side of bed on Thursday with R great toe. Now reports swelling and increased pain.

## 2016-06-08 NOTE — ED Provider Notes (Signed)
[facility]-EMERGENCY DEPT Provider Note   CSN: [id number] Arrival date & time: 06/08/16  1026  By signing my name below, I, Alyssa GroveMartin Green, attest that this documentation has been prepared under the direction and in the presence of Audry Piliyler Sylvester Salonga, PA-C. Electronically Signed: Alyssa GroveMartin Green, ED Scribe. 06/08/16. 10:45 AM.  History   Chief Complaint Chief Complaint  Patient presents with  . Foot Pain   The history is provided by the patient. No language interpreter was used.   HPI Comments: Stacey Lucas is a 40 y.o. female who presents to the Emergency Department complaining of gradual worsening, constant right great toe pain onset 06/06/2016. Pt states she struck her toe on her bed post. Pain is exacerbated with movement and walking. She has been taking Ibuprofen with no relief to pain. Pt reports swelling, numbness.  Past Medical History:  Diagnosis Date  . Diabetes mellitus without complication (HCC)     There are no active problems to display for this patient.   History reviewed. No pertinent surgical history.  OB History    No data available       Home Medications    Prior to Admission medications   Medication Sig Start Date End Date Taking? Authorizing Provider  cyclobenzaprine (FLEXERIL) 10 MG tablet Take 1 tablet (10 mg total) by mouth 2 (two) times daily as needed for muscle spasms. 05/14/15   Danelle BerryLeisa Tapia, PA-C  HYDROcodone-acetaminophen (NORCO/VICODIN) 5-325 MG per tablet Take 2 tablets by mouth every 4 (four) hours as needed. 05/14/15   Danelle BerryLeisa Tapia, PA-C  ibuprofen (ADVIL,MOTRIN) 800 MG tablet Take 1 tablet (800 mg total) by mouth 3 (three) times daily. 05/14/15   Danelle BerryLeisa Tapia, PA-C  insulin aspart protamine- aspart (NOVOLOG MIX 70/30) (70-30) 100 UNIT/ML injection Inject into the skin.    Historical Provider, MD    Family History No family history on file.  Social History Social History  Substance Use Topics  . Smoking status: Current Every Day Smoker    Packs/day: 1.00   Types: Cigarettes  . Smokeless tobacco: Never Used  . Alcohol use Yes     Comment: occasionally     Allergies   Review of patient's allergies indicates no known allergies.   Review of Systems Review of Systems  Constitutional: Negative for fever.  Musculoskeletal: Positive for arthralgias and joint swelling.  Neurological: Positive for numbness.  All other systems reviewed and are negative.   Physical Exam Updated Vital Signs BP 109/65 (BP Location: Right Arm)   Pulse 88   Temp 99.2 F (37.3 C) (Oral)   Resp 20   SpO2 99%   Physical Exam  Constitutional: She appears well-developed and well-nourished.  HENT:  Head: Normocephalic.  Eyes: Conjunctivae are normal.  Cardiovascular: Normal rate.   Pulmonary/Chest: Effort normal. No respiratory distress.  Abdominal: She exhibits no distension.  Musculoskeletal: Normal range of motion.  Right Great toe exam: Mild swelling, neurovascularly intact, cap refill < 2 sec, no signs of infection, erythema, motor intact  Neurological: She is alert.  Skin: Skin is warm and dry.  Psychiatric: She has a normal mood and affect. Her behavior is normal.  Nursing note and vitals reviewed.  ED Treatments / Results  DIAGNOSTIC STUDIES: Oxygen Saturation is 99% on RA, normal by my interpretation.    COORDINATION OF CARE: 10:41 AM Discussed treatment plan with pt at bedside which includes X-Ray and pt agreed to plan.  Labs (all labs ordered are listed, but only abnormal results are displayed) Labs Reviewed - No  data to display  EKG  EKG Interpretation None       Radiology Dg Foot Complete Right  Result Date: 06/08/2016 CLINICAL DATA:  Pain status post trauma 2 days ago. EXAM: RIGHT FOOT COMPLETE - 3+ VIEW COMPARISON:  None. FINDINGS: There is no evidence of fracture or dislocation. There is no evidence of arthropathy or other focal bone abnormality. Soft tissues are unremarkable. IMPRESSION: Negative. Electronically Signed   By:  Gerome Sam III M.D   On: 06/08/2016 11:29    Procedures Procedures (including critical care time)  Medications Ordered in ED Medications - No data to display   Initial Impression / Assessment and Plan / ED Course  I have reviewed the triage vital signs and the nursing notes.  Pertinent labs & imaging results that were available during my care of the patient were reviewed by me and considered in my medical decision making (see chart for details).  Clinical Course   I have reviewed and evaluated the relevant imaging studies.  I have reviewed the relevant previous healthcare records. I obtained HPI from historian.  ED Course:  Assessment:  Patient X-Ray negative for obvious fracture or dislocation. Likely sprain. Pt advised to follow up with PCP. Given post Op shoe for comfort. Conservative therapy recommended and discussed. Patient will be discharged home & is agreeable with above plan. Returns precautions discussed. Pt appears safe for discharge.  Disposition/Plan:  DC Home Additional Verbal discharge instructions given and discussed with patient.  Pt Instructed to f/u with PCP in the next week for evaluation and treatment of symptoms. Return precautions given Pt acknowledges and agrees with plan  Supervising Physician Linwood Dibbles, MD   Final Clinical Impressions(s) / ED Diagnoses   Final diagnoses:  Great toe pain, right    New Prescriptions New Prescriptions   No medications on file   I personally performed the services described in this documentation, which was scribed in my presence. The recorded information has been reviewed and is accurate.     Audry Pili, PA-C 06/08/16 1135    Linwood Dibbles, MD 06/09/16 [phone]

## 2016-06-08 NOTE — Discharge Instructions (Signed)
Please read and follow all provided instructions.  Your diagnoses today include:  1. Great toe pain, right    Tests performed today include: Vital signs. See below for your results today.   Medications prescribed:  Take as prescribed   Home care instructions:  Follow any educational materials contained in this packet.  Follow-up instructions: Please follow-up with your primary care provider for further evaluation of symptoms and treatment   Return instructions:  Please return to the Emergency Department if you do not get better, if you get worse, or new symptoms OR  - Fever (temperature greater than 101.67F)  - Bleeding that does not stop with holding pressure to the area    -Severe pain (please note that you may be more sore the day after your accident)  - Chest Pain  - Difficulty breathing  - Severe nausea or vomiting  - Inability to tolerate food and liquids  - Passing out  - Skin becoming red around your wounds  - Change in mental status (confusion or lethargy)  - New numbness or weakness    Please return if you have any other emergent concerns.  Additional Information:  Your vital signs today were: BP 109/65 (BP Location: Right Arm)    Pulse 88    Temp 99.2 F (37.3 C) (Oral)    Resp 20    LMP 05/17/2016 (Exact Date)    SpO2 99%  If your blood pressure (BP) was elevated above 135/85 this visit, please have this repeated by your doctor within one month. ---------------

## 2016-12-29 ENCOUNTER — Emergency Department (HOSPITAL_COMMUNITY)
Admission: EM | Admit: 2016-12-29 | Discharge: 2016-12-29 | Disposition: A | Discharge status: Home / Self Care | Payer: Self-pay | Attending: Emergency Medicine | Admitting: Emergency Medicine

## 2016-12-29 ENCOUNTER — Emergency Department (HOSPITAL_COMMUNITY): Payer: Self-pay

## 2016-12-29 ENCOUNTER — Encounter (HOSPITAL_COMMUNITY): Payer: Self-pay | Admitting: Emergency Medicine

## 2016-12-29 DIAGNOSIS — Z794 Long term (current) use of insulin: Secondary | ICD-10-CM | POA: Insufficient documentation

## 2016-12-29 DIAGNOSIS — R809 Proteinuria, unspecified: Secondary | ICD-10-CM | POA: Insufficient documentation

## 2016-12-29 DIAGNOSIS — R791 Abnormal coagulation profile: Secondary | ICD-10-CM | POA: Insufficient documentation

## 2016-12-29 DIAGNOSIS — N3000 Acute cystitis without hematuria: Secondary | ICD-10-CM | POA: Insufficient documentation

## 2016-12-29 DIAGNOSIS — Z79899 Other long term (current) drug therapy: Secondary | ICD-10-CM | POA: Insufficient documentation

## 2016-12-29 DIAGNOSIS — E119 Type 2 diabetes mellitus without complications: Secondary | ICD-10-CM | POA: Insufficient documentation

## 2016-12-29 DIAGNOSIS — F17210 Nicotine dependence, cigarettes, uncomplicated: Secondary | ICD-10-CM | POA: Insufficient documentation

## 2016-12-29 LAB — CBC WITH DIFFERENTIAL/PLATELET
Basophils Absolute: 0 10*3/uL (ref 0.0–0.1)
Basophils Relative: 0 %
Eosinophils Absolute: 0 10*3/uL (ref 0.0–0.7)
Eosinophils Relative: 0 %
HEMATOCRIT: 38.1 % (ref 36.0–46.0)
Hemoglobin: 13.2 g/dL (ref 12.0–15.0)
LYMPHS ABS: 1 10*3/uL (ref 0.7–4.0)
LYMPHS PCT: 8 %
MCH: 27 pg (ref 26.0–34.0)
MCHC: 34.6 g/dL (ref 30.0–36.0)
MCV: 78.1 fL (ref 78.0–100.0)
Monocytes Absolute: 0.9 10*3/uL (ref 0.1–1.0)
Monocytes Relative: 7 %
Neutro Abs: 11.3 10*3/uL — ABNORMAL HIGH (ref 1.7–7.7)
Neutrophils Relative %: 85 %
PLATELETS: 236 10*3/uL (ref 150–400)
RBC: 4.88 MIL/uL (ref 3.87–5.11)
RDW: 15 % (ref 11.5–15.5)
WBC: 13.2 10*3/uL — AB (ref 4.0–10.5)

## 2016-12-29 LAB — CBG MONITORING, ED
Glucose-Capillary: 212 mg/dL — ABNORMAL HIGH (ref 65–99)
Glucose-Capillary: 237 mg/dL — ABNORMAL HIGH (ref 65–99)

## 2016-12-29 LAB — COMPREHENSIVE METABOLIC PANEL
ALK PHOS: 101 U/L (ref 38–126)
ALT: 8 U/L — ABNORMAL LOW (ref 14–54)
AST: 14 U/L — ABNORMAL LOW (ref 15–41)
Albumin: 3.6 g/dL (ref 3.5–5.0)
Anion gap: 10 (ref 5–15)
BUN: 9 mg/dL (ref 6–20)
CALCIUM: 8.9 mg/dL (ref 8.9–10.3)
CO2: 21 mmol/L — AB (ref 22–32)
CREATININE: 0.78 mg/dL (ref 0.44–1.00)
Chloride: 101 mmol/L (ref 101–111)
GFR calc Af Amer: >60 mL/min (ref >60)
GFR calc non Af Amer: >60 mL/min (ref >60)
Glucose, Bld: 239 mg/dL — ABNORMAL HIGH (ref 65–99)
Potassium: 4.3 mmol/L (ref 3.5–5.1)
SODIUM: 132 mmol/L — AB (ref 135–145)
Total Bilirubin: 0.9 mg/dL (ref 0.3–1.2)
Total Protein: 7.9 g/dL (ref 6.5–8.1)

## 2016-12-29 LAB — INFLUENZA PANEL BY PCR (TYPE A & B)
INFLAPCR: NEGATIVE
Influenza B By PCR: NEGATIVE

## 2016-12-29 LAB — I-STAT BETA HCG BLOOD, ED (MC, WL, AP ONLY)

## 2016-12-29 LAB — URINALYSIS, ROUTINE W REFLEX MICROSCOPIC
Bilirubin Urine: NEGATIVE
Glucose, UA: >=500 mg/dL — AB
Ketones, ur: 20 mg/dL — AB
NITRITE: POSITIVE — AB
PH: 5 (ref 5.0–8.0)
Protein, ur: >=300 mg/dL — AB
SPECIFIC GRAVITY, URINE: 1.016 (ref 1.005–1.030)

## 2016-12-29 LAB — I-STAT CG4 LACTIC ACID, ED
LACTIC ACID, VENOUS: 0.52 mmol/L (ref 0.5–1.9)
Lactic Acid, Venous: 1.16 mmol/L (ref 0.5–1.9)

## 2016-12-29 LAB — PROTIME-INR
INR: 0.93
PROTHROMBIN TIME: 12.5 s (ref 11.4–15.2)

## 2016-12-29 LAB — LIPASE, BLOOD: LIPASE: 30 U/L (ref 11–51)

## 2016-12-29 LAB — I-STAT TROPONIN, ED: Troponin i, poc: 0 ng/mL (ref 0.00–0.08)

## 2016-12-29 MED ORDER — METFORMIN HCL 500 MG PO TABS: ORAL_TABLET | ORAL | 3 refills | Status: DC

## 2016-12-29 MED ORDER — SODIUM CHLORIDE 0.9 % IV BOLUS (SEPSIS)
1000.0000 mL | Freq: Once | INTRAVENOUS | Status: AC
  Administered 2016-12-29: 1000 mL via INTRAVENOUS

## 2016-12-29 MED ORDER — FLUCONAZOLE 150 MG PO TABS: 150.0000 mg | ORAL_TABLET | Freq: Once | ORAL | 0 refills | Status: AC

## 2016-12-29 MED ORDER — IBUPROFEN 800 MG PO TABS
800.0000 mg | ORAL_TABLET | Freq: Once | ORAL | Status: AC
  Administered 2016-12-29: 800 mg via ORAL
  Filled 2016-12-29: qty 1

## 2016-12-29 MED ORDER — DEXTROSE 5 % IV SOLN
1.0000 g | Freq: Once | INTRAVENOUS | Status: AC
  Administered 2016-12-29: 1 g via INTRAVENOUS
  Filled 2016-12-29: qty 10

## 2016-12-29 MED ORDER — NITROFURANTOIN MONOHYD MACRO 100 MG PO CAPS: 100.0000 mg | ORAL_CAPSULE | Freq: Two times a day (BID) | ORAL | 0 refills | Status: DC

## 2016-12-29 NOTE — ED Notes (Signed)
Patient states "I think my blood sugar dropped".  Patient alert and oriented but states she feels like her hands are tremulous.

## 2016-12-29 NOTE — ED Provider Notes (Signed)
[facility] Provider Note   CSN: [id number] Arrival date & time: 12/29/16  1000     History   Chief Complaint Chief Complaint  Patient presents with  . Cough  . Fatigue  . Fever  . Shortness of Breath  . Emesis    HPI Stacey Lucas is a 41 y.o. female , who presents to the emergency department for  flulike symptoms.. She is a type II diabetic with insulin dependence who has been not been taking her medication for several months because she does not have primary care at this time. The patient states that this past Friday. She suddenly began feeling very poorly. She developed a cough, body aches, headache, chills. She has had a fever of up to 103 at home. She did not come to the emergency department until this morning because this morning she had an episode of vomiting. She denies diarrhea, abdominal pain, chest pain, shortness of breath. She does acknowledge a throbbing global headache, which she attributes to cough. She is also having a fever today. Patient denies photophobia, phonophobia, stiff neck, rashes, sores or urinary abnormalities. She states she has felt like this previously with episodes of DKA.  HPI  Past Medical History:  Diagnosis Date  . Diabetes mellitus without complication (HCC)     There are no active problems to display for this patient.   History reviewed. No pertinent surgical history.  OB History    No data available       Home Medications    Prior to Admission medications   Medication Sig Start Date End Date Taking? Authorizing Provider  cyclobenzaprine (FLEXERIL) 10 MG tablet Take 1 tablet (10 mg total) by mouth 2 (two) times daily as needed for muscle spasms. 05/14/15   Danelle Berry, PA-C  HYDROcodone-acetaminophen (NORCO/VICODIN) 5-325 MG per tablet Take 2 tablets by mouth every 4 (four) hours as needed. 05/14/15   Danelle Berry, PA-C  ibuprofen (ADVIL,MOTRIN) 800 MG tablet Take 1 tablet (800 mg total) by mouth 3 (three) times daily. 05/14/15    Danelle Berry, PA-C  insulin aspart protamine- aspart (NOVOLOG MIX 70/30) (70-30) 100 UNIT/ML injection Inject into the skin.    Historical Provider, MD    Family History No family history on file.  Social History Social History  Substance Use Topics  . Smoking status: Current Every Day Smoker    Packs/day: 1.00    Types: Cigarettes  . Smokeless tobacco: Never Used  . Alcohol use Yes     Comment: occasionally     Allergies   Patient has no known allergies.   Review of Systems Review of Systems  Ten systems reviewed and are negative for acute change, except as noted in the HPI.   Physical Exam Updated Vital Signs BP (!) 154/82   Pulse (!) 119   Temp (!) 103.3 F (39.6 C)   Resp 18   Ht 5' (1.524 m)   Wt 70.3 kg   SpO2 99%   BMI 30.27 kg/m   Physical Exam  Constitutional: She is oriented to person, place, and time. She appears well-developed and well-nourished. No distress.  HENT:  Head: Normocephalic and atraumatic.  Eyes: Conjunctivae are normal. No scleral icterus.  Neck: Normal range of motion.  Cardiovascular: Regular rhythm and normal heart sounds.  Exam reveals no gallop and no friction rub.   No murmur heard. tachycardic  Pulmonary/Chest: Effort normal and breath sounds normal. No respiratory distress.  Abdominal: Soft. Bowel sounds are normal. She exhibits no distension  and no mass. There is no tenderness. There is no guarding.  Neurological: She is alert and oriented to person, place, and time.  Skin: Skin is warm and dry. She is not diaphoretic.  Psychiatric: Her behavior is normal.  Nursing note and vitals reviewed.    ED Treatments / Results  Labs (all labs ordered are listed, but only abnormal results are displayed) Labs Reviewed  COMPREHENSIVE METABOLIC PANEL - Abnormal; Notable for the following:       Result Value   Sodium 132 (*)    CO2 21 (*)    Glucose, Bld 239 (*)    AST 14 (*)    ALT 8 (*)    All other components within normal  limits  CBC WITH DIFFERENTIAL/PLATELET - Abnormal; Notable for the following:    WBC 13.2 (*)    Neutro Abs 11.3 (*)    All other components within normal limits  CBG MONITORING, ED - Abnormal; Notable for the following:    Glucose-Capillary 212 (*)    All other components within normal limits  CULTURE, BLOOD (ROUTINE X 2)  CULTURE, BLOOD (ROUTINE X 2)  PROTIME-INR  URINALYSIS, ROUTINE W REFLEX MICROSCOPIC  INFLUENZA PANEL BY PCR (TYPE A & B)  LIPASE, BLOOD  I-STAT CG4 LACTIC ACID, ED  I-STAT TROPOININ, ED  I-STAT BETA HCG BLOOD, ED ([facility], [facility], [facility] ONLY)    EKG  EKG Interpretation None       Radiology Dg Chest 2 View  Result Date: 12/29/2016 CLINICAL DATA:  Nonproductive cough with right rib pain for 2 days. EXAM: CHEST  2 VIEW COMPARISON:  None. FINDINGS: The lungs are clear. Heart size is normal. No pneumothorax or pleural fluid. No acute bony abnormality. IMPRESSION: Negative chest. Electronically Signed   By: Drusilla Kanner M.D.   On: 12/29/2016 11:22    Procedures Procedures (including critical care time)  Medications Ordered in ED Medications  sodium chloride 0.9 % bolus 1,000 mL (not administered)  ibuprofen (ADVIL,MOTRIN) tablet 800 mg (not administered)     Initial Impression / Assessment and Plan / ED Course  I have reviewed the triage vital signs and the nursing notes.  Pertinent labs & imaging results that were available during my care of the patient were reviewed by me and considered in my medical decision making (see chart for details).  Clinical Course as of Dec 29 1605  Sun Dec 29, 2016  1215  Patient is febrile and tachycardic, but hypertensive with an elevated white count. She has a negative d-dimer. Her chest x-ray is negative and is very well-appearing.  Her  lactic acid is normal. She has a pending urine, she does not appear to be in DKA. I have ordered an influenza panel on the patient. We'll treat the patient with fluids and Tylenol and if  her  vitals are not improving. We will initiate antibiotics.  [AH]  1340 Protein: (!) >=300 [AH]  1340 Nitrite: (!) POSITIVE [AH]  1341 WBC, UA: TOO NUMEROUS TO COUNT [AH]  1341  Patient with nitrite-positive urine, too numerous to count white blood cells. She has a slight amount of ketones, but does not have an anion gap and I do not think she has DKA. The patient also has a very high amount of protein in her urine, which may be in the nephrotic range. This is obviously concerning giving her untreated diabetes and the patient will certainly need reevaluation of this in the future. Patient continues to remain tachycardic, although she has  improved as her fever has decreased. We will repeat a lactic acid. At this time given that her medicines, has not completely normalized. I have ordered IV Rocephin and a urine culture. Bacteria, UA: (!) FEW [AH]    Clinical Course User Index [AH] Arthor Captain, PA-C    Patient is much improved after fluids, treatment with IV Rocephin. She does appear to have a urinary tract infection. Patient is diabetic and I have started the patient on metformin as she is type II, she will get a prescription for Macrobid and Diflucan if she should develop a yeast vaginitis. I have advised patient to follow-up about her proteinuria for a repeat urinalysis to look at the patient's protein levels. I had a long discussion with her about the need for follow-up. She understands. We'll also in box our case manager to ensure patient has follow-up at one of our clinics.  Final Clinical Impressions(s) / ED Diagnoses   Final diagnoses:  Acute cystitis without hematuria  Proteinuria, unspecified type    New Prescriptions New Prescriptions   No medications on file     Arthor Captain, PA-C 12/29/16 1613    Canary Brim Tegeler, MD 12/29/16 2002

## 2016-12-29 NOTE — ED Notes (Signed)
Patient transported to X-ray 

## 2016-12-29 NOTE — ED Triage Notes (Signed)
Onset 2 days ago developed right rib pain, non productive cough, fatigue, sleeping a lot past 2 days, general body achy. Last took tylenol today at 0800. CBG in triage 212. Alert answering and following commands appropriate.

## 2016-12-29 NOTE — ED Notes (Signed)
Patient states she is unable to provide urine specimen at this time.

## 2016-12-29 NOTE — Discharge Instructions (Signed)
Please seek immediate care if you develop the following: ?There is back pain.  ?Your symptoms are no better or worse in 3 days. ?There is severe back pain or lower abdominal pain.  ?You develop chills.  ?You have a fever.  ?There is nausea or vomiting.  ?There is continued burning or discomfort with urination.  ? ?

## 2016-12-31 ENCOUNTER — Telehealth: Payer: Self-pay | Admitting: *Deleted

## 2016-12-31 LAB — URINE CULTURE: Culture: >=100000 — AB

## 2016-12-31 NOTE — Telephone Encounter (Signed)
EDCM called [facility] to obtain PCP appointment for pt.  Left message for scheduler to call pt or me with appointment.

## 2017-01-01 ENCOUNTER — Telehealth: Payer: Self-pay | Admitting: Emergency Medicine

## 2017-01-01 NOTE — Telephone Encounter (Signed)
Post ED Visit - Positive Culture Follow-up  Culture report reviewed by antimicrobial stewardship pharmacist:   Enzo Bi, Pharm.D.  Celedonio Miyamoto, Pharm.D., BCPS AQ-ID  Garvin Fila, Pharm.D., BCPS  Georgina Pillion, Pharm.D., BCPS  [place], [address].D., BCPS, AAHIVP  Estella Husk, Pharm.D., BCPS, AAHIVP  Lysle Pearl, PharmD, BCPS  Casilda Carls, PharmD, BCPS  Pollyann Samples, PharmD, BCPS  Positive urine culture Treated with nitrofurantoin, organism sensitive to the same and no further patient follow-up is required at this time.  Berle Mull 01/01/2017, 1:57 PM

## 2017-01-02 ENCOUNTER — Telehealth (HOSPITAL_BASED_OUTPATIENT_CLINIC_OR_DEPARTMENT_OTHER): Payer: Self-pay | Admitting: *Deleted

## 2017-01-02 LAB — BLOOD CULTURE ID PANEL (REFLEXED)
Acinetobacter baumannii: NOT DETECTED
CANDIDA ALBICANS: NOT DETECTED
CANDIDA GLABRATA: NOT DETECTED
Candida krusei: NOT DETECTED
Candida parapsilosis: NOT DETECTED
Candida tropicalis: NOT DETECTED
ENTEROBACTER CLOACAE COMPLEX: NOT DETECTED
ENTEROBACTERIACEAE SPECIES: NOT DETECTED
Enterococcus species: NOT DETECTED
Escherichia coli: NOT DETECTED
Haemophilus influenzae: NOT DETECTED
KLEBSIELLA PNEUMONIAE: NOT DETECTED
Klebsiella oxytoca: NOT DETECTED
Listeria monocytogenes: NOT DETECTED
NEISSERIA MENINGITIDIS: NOT DETECTED
Proteus species: NOT DETECTED
Pseudomonas aeruginosa: NOT DETECTED
STREPTOCOCCUS AGALACTIAE: NOT DETECTED
STREPTOCOCCUS PNEUMONIAE: NOT DETECTED
Serratia marcescens: NOT DETECTED
Staphylococcus aureus (BCID): NOT DETECTED
Staphylococcus species: NOT DETECTED
Streptococcus pyogenes: NOT DETECTED
Streptococcus species: NOT DETECTED

## 2017-01-02 NOTE — Telephone Encounter (Signed)
Call received from Ruthy Dick, [facility], reporting pt has +blood culture from 12/29/16, one bottle of 4, anaerobic bottle positive for gram+ cocci in clusters, BCID not detected. Discussed with Dr. Deretha Emory who states to call pt to see how she is feeling. Message left on pt's home # [phone]. Mobile number on chart was for emergency services, not private cell phone.

## 2017-01-03 LAB — CULTURE, BLOOD (ROUTINE X 2)
CULTURE: NO GROWTH
SPECIAL REQUESTS: ADEQUATE

## 2017-01-05 LAB — CULTURE, BLOOD (ROUTINE X 2): Special Requests: ADEQUATE

## 2017-01-20 ENCOUNTER — Ambulatory Visit: Payer: Self-pay | Admitting: Nurse Practitioner

## 2017-02-13 ENCOUNTER — Emergency Department (HOSPITAL_COMMUNITY): Payer: Self-pay

## 2017-02-13 ENCOUNTER — Encounter (HOSPITAL_COMMUNITY): Payer: Self-pay | Admitting: Emergency Medicine

## 2017-02-13 ENCOUNTER — Emergency Department (HOSPITAL_COMMUNITY)
Admission: EM | Admit: 2017-02-13 | Discharge: 2017-02-13 | Disposition: A | Discharge status: Home / Self Care | Payer: Self-pay | Attending: Emergency Medicine | Admitting: Emergency Medicine

## 2017-02-13 DIAGNOSIS — F17210 Nicotine dependence, cigarettes, uncomplicated: Secondary | ICD-10-CM | POA: Insufficient documentation

## 2017-02-13 DIAGNOSIS — E119 Type 2 diabetes mellitus without complications: Secondary | ICD-10-CM | POA: Insufficient documentation

## 2017-02-13 DIAGNOSIS — M722 Plantar fascial fibromatosis: Secondary | ICD-10-CM | POA: Insufficient documentation

## 2017-02-13 DIAGNOSIS — Z794 Long term (current) use of insulin: Secondary | ICD-10-CM | POA: Insufficient documentation

## 2017-02-13 MED ORDER — DICLOFENAC SODIUM 75 MG PO TBEC: 75.0000 mg | DELAYED_RELEASE_TABLET | Freq: Two times a day (BID) | ORAL | 0 refills | Status: DC

## 2017-02-13 NOTE — ED Notes (Signed)
Ortho tech paged due to patients size not available in post op shoes in department.

## 2017-02-13 NOTE — ED Triage Notes (Signed)
Pt reports left foot pain and swelling that started yesterday, denies any known injury. Pt ambulatory to triage with limp.

## 2017-02-13 NOTE — ED Notes (Signed)
See EDP secondary assessment.  

## 2017-02-13 NOTE — Progress Notes (Signed)
Orthopedic Tech Progress Note Patient Details:  Stacey Lucas 1975/09/15 [id number]  Ortho Devices Type of Ortho Device: Postop shoe/boot, Ace wrap Ortho Device/Splint Interventions: Application   Saul FordyceJennifer C Ryaan Vanwagoner 02/13/2017, 9:55 AM

## 2017-02-13 NOTE — ED Provider Notes (Signed)
[facility]-EMERGENCY DEPT Provider Note    By signing my name below, I, Stacey Lucas, attest that this documentation has been prepared under the direction and in the presence of Stacey Lucas, [place]. Electronically Signed: Earmon PhoenixJennifer Lucas, ED Scribe. 02/13/17. 9:58 AM.    History   Chief Complaint Chief Complaint  Patient presents with  . Foot Pain   The history is provided by the patient and medical records. No language interpreter was used.    Stacey Lucas is a 41 y.o. female with PMHx of DM who presents to the Emergency Department complaining of left foot pain that began two days ago. She reports associated swelling of the ankle. She describes the pain as soreness. She is unsure of injury. She has not taken anything for pain. Standing and ambulation increases the pain. She denies alleviating factors. She walks and stands on her feet for long periods of time. She denies fever, chills, nausea, vomiting, numbness, tingling or weakness of any extremity, bruising, wounds.   Past Medical History:  Diagnosis Date  . Diabetes mellitus without complication (HCC)     There are no active problems to display for this patient.   History reviewed. No pertinent surgical history.  OB History    No data available       Home Medications    Prior to Admission medications   Medication Sig Start Date End Date Taking? Authorizing Provider  cyclobenzaprine (FLEXERIL) 10 MG tablet Take 1 tablet (10 mg total) by mouth 2 (two) times daily as needed for muscle spasms. Patient not taking: Reported on 12/29/2016 05/14/15   Danelle Berryapia, Leisa, PA-C  diclofenac (VOLTAREN) 75 MG EC tablet Take 1 tablet (75 mg total) by mouth 2 (two) times daily. 02/13/17   Elson AreasSofia, Mel Tadros K, PA-C  HYDROcodone-acetaminophen (NORCO/VICODIN) 5-325 MG per tablet Take 2 tablets by mouth every 4 (four) hours as needed. Patient not taking: Reported on 12/29/2016 05/14/15   Danelle Berryapia, Leisa, PA-C  ibuprofen (ADVIL,MOTRIN) 800 MG tablet Take 1  tablet (800 mg total) by mouth 3 (three) times daily. 05/14/15   Danelle Berryapia, Leisa, PA-C  insulin aspart protamine- aspart (NOVOLOG MIX 70/30) (70-30) 100 UNIT/ML injection Inject into the skin.    [provider]  metFORMIN (GLUCOPHAGE) 500 MG tablet Take one tablet daily for 4 days then 1 tablet twice a day thereafter. 12/29/16   Arthor CaptainHarris, Abigail, PA-C  nitrofurantoin, macrocrystal-monohydrate, (MACROBID) 100 MG capsule Take 1 capsule (100 mg total) by mouth 2 (two) times daily. X 7 days 12/29/16   Arthor CaptainHarris, Abigail, PA-C    Family History No family history on file.  Social History Social History  Substance Use Topics  . Smoking status: Current Every Day Smoker    Packs/day: 1.00    Types: Cigarettes  . Smokeless tobacco: Never Used  . Alcohol use Yes     Comment: occasionally     Allergies   Patient has no known allergies.   Review of Systems Review of Systems  Constitutional: Negative for chills and fever.  Gastrointestinal: Negative for nausea and vomiting.  Musculoskeletal: Positive for arthralgias and joint swelling.  Skin: Negative for color change and wound.  Neurological: Negative for weakness and numbness.     Physical Exam Updated Vital Signs BP 111/72   Pulse (!) 109   Temp 98.2 F (36.8 C) (Oral)   Resp 12   SpO2 98%   Physical Exam  Constitutional: She is oriented to person, place, and time. She appears well-developed and well-nourished.  HENT:  Head: Normocephalic  and atraumatic.  Neck: Normal range of motion.  Cardiovascular: Normal rate.   Pulmonary/Chest: Effort normal.  Musculoskeletal: Normal range of motion. She exhibits tenderness. She exhibits no edema or deformity.  TTP to plantar surface of left foot and heel.  Neurological: She is alert and oriented to person, place, and time.  Skin: Skin is warm and dry.  Psychiatric: She has a normal mood and affect. Her behavior is normal.  Nursing note and vitals reviewed.    ED Treatments /  Results  DIAGNOSTIC STUDIES: Oxygen Saturation is 98% on RA, normal by my interpretation.   COORDINATION OF CARE: 9:42 AM- Will order post op shoe and give referral to podiatry. Recommended inserts and stretching with a tennis ball. Will prescribe NSAID. Pt verbalizes understanding and agrees to plan.  Medications - No data to display  Labs (all labs ordered are listed, but only abnormal results are displayed) Labs Reviewed - No data to display  EKG  EKG Interpretation None       Radiology Dg Foot Complete Left  Result Date: 02/13/2017 CLINICAL DATA:  Pain and swelling.  History of gout. EXAM: LEFT FOOT - COMPLETE 3+ VIEW COMPARISON:  No prior. FINDINGS: No acute bony or joint abnormality identified. No evidence of fracture or dislocation. Mild degenerative changes first MTP joint. No erosive bony abnormalities. IMPRESSION: Mild degenerative changes first MTP joint . No erosive bony abnormalities. No acute abnormality. Electronically Signed   By: Maisie Fus  Register   On: 02/13/2017 09:34    Procedures Procedures (including critical care time)  Medications Ordered in ED Medications - No data to display   Initial Impression / Assessment and Plan / ED Course  I have reviewed the triage vital signs and the nursing notes.  Pertinent labs & imaging results that were available during my care of the patient were reviewed by me and considered in my medical decision making (see chart for details).    Patient X-Ray negative for obvious fracture or dislocation.  Pt advised to follow up with podiatry for suspected plantar fasciitis. Patient given post op shoe while in ED, conservative therapy recommended and discussed. Patient will be discharged home & is agreeable with above plan. Returns precautions discussed. Pt appears safe for discharge.   Final Clinical Impressions(s) / ED Diagnoses   Final diagnoses:  Plantar fasciitis of left foot    New Prescriptions New Prescriptions    DICLOFENAC (VOLTAREN) 75 MG EC TABLET    Take 1 tablet (75 mg total) by mouth 2 (two) times daily.   An After Visit Summary was printed and given to the patient.  I personally performed the services in this documentation, which was scribed in my presence.  The recorded information has been reviewed and considered.   Barnet Pall.   Elson Areas, Cordelia Poche 02/13/17 1549    Tilden Fossa, MD 02/14/17 [phone]

## 2017-02-13 NOTE — ED Notes (Signed)
Patient transported to X-ray 

## 2017-04-25 ENCOUNTER — Emergency Department (HOSPITAL_COMMUNITY): Admission: EM | Admit: 2017-04-25 | Discharge: 2017-04-25 | Discharge status: Against Medical Advice | Payer: Self-pay

## 2017-04-25 NOTE — ED Notes (Signed)
Pt states, "I am not going to stay to be seen." pt encouraged to remain to be checked, pt states, "i cannot wait."

## 2017-06-08 ENCOUNTER — Other Ambulatory Visit: Payer: Self-pay

## 2017-06-08 ENCOUNTER — Emergency Department (HOSPITAL_COMMUNITY)
Admission: EM | Admit: 2017-06-08 | Discharge: 2017-06-08 | Disposition: A | Discharge status: Home / Self Care | Payer: No Typology Code available for payment source | Attending: Emergency Medicine | Admitting: Emergency Medicine

## 2017-06-08 ENCOUNTER — Encounter (HOSPITAL_COMMUNITY): Payer: Self-pay | Admitting: Emergency Medicine

## 2017-06-08 DIAGNOSIS — Z5321 Procedure and treatment not carried out due to patient leaving prior to being seen by health care provider: Secondary | ICD-10-CM | POA: Diagnosis not present

## 2017-06-08 DIAGNOSIS — R079 Chest pain, unspecified: Secondary | ICD-10-CM | POA: Diagnosis present

## 2017-06-08 LAB — CBC
HEMATOCRIT: 38.9 % (ref 36.0–46.0)
Hemoglobin: 13.5 g/dL (ref 12.0–15.0)
MCH: 27.1 pg (ref 26.0–34.0)
MCHC: 34.7 g/dL (ref 30.0–36.0)
MCV: 78.1 fL (ref 78.0–100.0)
PLATELETS: 244 10*3/uL (ref 150–400)
RBC: 4.98 MIL/uL (ref 3.87–5.11)
RDW: 14.5 % (ref 11.5–15.5)
WBC: 13.8 10*3/uL — ABNORMAL HIGH (ref 4.0–10.5)

## 2017-06-08 LAB — BASIC METABOLIC PANEL
Anion gap: 14 (ref 5–15)
BUN: 11 mg/dL (ref 6–20)
CALCIUM: 9.1 mg/dL (ref 8.9–10.3)
CO2: 23 mmol/L (ref 22–32)
CREATININE: 0.68 mg/dL (ref 0.44–1.00)
Chloride: 99 mmol/L — ABNORMAL LOW (ref 101–111)
GFR calc Af Amer: >60 mL/min (ref >60)
GLUCOSE: 196 mg/dL — AB (ref 65–99)
POTASSIUM: 3.9 mmol/L (ref 3.5–5.1)
SODIUM: 136 mmol/L (ref 135–145)

## 2017-06-08 LAB — I-STAT TROPONIN, ED: Troponin i, poc: 0 ng/mL (ref 0.00–0.08)

## 2017-06-08 NOTE — ED Notes (Signed)
Xray came to get patient once again.  Still not answering when called.

## 2017-06-08 NOTE — ED Notes (Signed)
Still no answer when called.  Removed at this time.

## 2017-06-08 NOTE — ED Notes (Signed)
Xray came to get pt for xray no answer in the lobby. Will call xray when pt shows up

## 2017-06-08 NOTE — ED Triage Notes (Signed)
Pt c/o upper left side CP onset 3 hours ago while resting. Radiation to L arm with numbness, +shob, +nausea.

## 2017-07-22 ENCOUNTER — Emergency Department (HOSPITAL_COMMUNITY)
Admission: EM | Admit: 2017-07-22 | Discharge: 2017-07-22 | Disposition: A | Discharge status: Home / Self Care | Payer: Self-pay | Attending: Emergency Medicine | Admitting: Emergency Medicine

## 2017-07-22 ENCOUNTER — Other Ambulatory Visit: Payer: Self-pay

## 2017-07-22 ENCOUNTER — Encounter (HOSPITAL_COMMUNITY): Payer: Self-pay | Admitting: *Deleted

## 2017-07-22 DIAGNOSIS — R197 Diarrhea, unspecified: Secondary | ICD-10-CM | POA: Insufficient documentation

## 2017-07-22 DIAGNOSIS — Z5321 Procedure and treatment not carried out due to patient leaving prior to being seen by health care provider: Secondary | ICD-10-CM | POA: Insufficient documentation

## 2017-07-22 DIAGNOSIS — R1110 Vomiting, unspecified: Secondary | ICD-10-CM | POA: Insufficient documentation

## 2017-07-22 LAB — COMPREHENSIVE METABOLIC PANEL
ALBUMIN: 3.7 g/dL (ref 3.5–5.0)
ALK PHOS: 97 U/L (ref 38–126)
ALT: 8 U/L — AB (ref 14–54)
AST: 17 U/L (ref 15–41)
Anion gap: 11 (ref 5–15)
BUN: 7 mg/dL (ref 6–20)
CALCIUM: 9.3 mg/dL (ref 8.9–10.3)
CO2: 23 mmol/L (ref 22–32)
CREATININE: 0.68 mg/dL (ref 0.44–1.00)
Chloride: 104 mmol/L (ref 101–111)
GFR calc Af Amer: >60 mL/min (ref >60)
GLUCOSE: 191 mg/dL — AB (ref 65–99)
POTASSIUM: 3.9 mmol/L (ref 3.5–5.1)
Sodium: 138 mmol/L (ref 135–145)
TOTAL PROTEIN: 7 g/dL (ref 6.5–8.1)
Total Bilirubin: 0.6 mg/dL (ref 0.3–1.2)

## 2017-07-22 LAB — CBC
HEMATOCRIT: 36.5 % (ref 36.0–46.0)
Hemoglobin: 12.5 g/dL (ref 12.0–15.0)
MCH: 27.1 pg (ref 26.0–34.0)
MCHC: 34.2 g/dL (ref 30.0–36.0)
MCV: 79 fL (ref 78.0–100.0)
PLATELETS: 209 10*3/uL (ref 150–400)
RBC: 4.62 MIL/uL (ref 3.87–5.11)
RDW: 14.4 % (ref 11.5–15.5)
WBC: 10.3 10*3/uL (ref 4.0–10.5)

## 2017-07-22 LAB — LIPASE, BLOOD: Lipase: 24 U/L (ref 11–51)

## 2017-07-22 LAB — I-STAT BETA HCG BLOOD, ED (MC, WL, AP ONLY): I-stat hCG, quantitative: <5 m[IU]/mL (ref <5)

## 2017-07-22 NOTE — ED Notes (Signed)
Called pt x2 for room, no response. 

## 2017-07-22 NOTE — ED Notes (Signed)
Called Pt's name x3 for vital update, no response

## 2017-07-22 NOTE — ED Triage Notes (Signed)
Pt is here with right lower quad and right lower back pain for 5 days and reports vomiting and diarrhea with it.

## 2017-09-13 ENCOUNTER — Other Ambulatory Visit: Payer: Self-pay

## 2017-09-13 ENCOUNTER — Emergency Department (HOSPITAL_COMMUNITY)
Admission: EM | Admit: 2017-09-13 | Discharge: 2017-09-13 | Disposition: A | Discharge status: Home / Self Care | Payer: BLUE CROSS/BLUE SHIELD | Attending: Emergency Medicine | Admitting: Emergency Medicine

## 2017-09-13 ENCOUNTER — Encounter (HOSPITAL_COMMUNITY): Payer: Self-pay

## 2017-09-13 DIAGNOSIS — Y998 Other external cause status: Secondary | ICD-10-CM | POA: Diagnosis not present

## 2017-09-13 DIAGNOSIS — R509 Fever, unspecified: Secondary | ICD-10-CM | POA: Insufficient documentation

## 2017-09-13 DIAGNOSIS — Y9383 Activity, rough housing and horseplay: Secondary | ICD-10-CM | POA: Insufficient documentation

## 2017-09-13 DIAGNOSIS — W228XXA Striking against or struck by other objects, initial encounter: Secondary | ICD-10-CM | POA: Insufficient documentation

## 2017-09-13 DIAGNOSIS — E119 Type 2 diabetes mellitus without complications: Secondary | ICD-10-CM | POA: Insufficient documentation

## 2017-09-13 DIAGNOSIS — Y92019 Unspecified place in single-family (private) house as the place of occurrence of the external cause: Secondary | ICD-10-CM | POA: Insufficient documentation

## 2017-09-13 DIAGNOSIS — Z794 Long term (current) use of insulin: Secondary | ICD-10-CM | POA: Diagnosis not present

## 2017-09-13 DIAGNOSIS — S0501XA Injury of conjunctiva and corneal abrasion without foreign body, right eye, initial encounter: Secondary | ICD-10-CM | POA: Diagnosis not present

## 2017-09-13 DIAGNOSIS — F17210 Nicotine dependence, cigarettes, uncomplicated: Secondary | ICD-10-CM | POA: Insufficient documentation

## 2017-09-13 DIAGNOSIS — H538 Other visual disturbances: Secondary | ICD-10-CM | POA: Insufficient documentation

## 2017-09-13 DIAGNOSIS — H53141 Visual discomfort, right eye: Secondary | ICD-10-CM | POA: Diagnosis not present

## 2017-09-13 DIAGNOSIS — S0591XA Unspecified injury of right eye and orbit, initial encounter: Secondary | ICD-10-CM | POA: Diagnosis present

## 2017-09-13 DIAGNOSIS — H5711 Ocular pain, right eye: Secondary | ICD-10-CM

## 2017-09-13 MED ORDER — OFLOXACIN 0.3 % OP SOLN
1.0000 [drp] | OPHTHALMIC | Status: DC
  Administered 2017-09-13: 1 [drp] via OPHTHALMIC
  Filled 2017-09-13: qty 5

## 2017-09-13 MED ORDER — TETRACAINE HCL 0.5 % OP SOLN
2.0000 [drp] | Freq: Once | OPHTHALMIC | Status: AC
  Administered 2017-09-13: 2 [drp] via OPHTHALMIC
  Filled 2017-09-13: qty 4

## 2017-09-13 MED ORDER — FLUORESCEIN SODIUM 1 MG OP STRP
1.0000 | ORAL_STRIP | Freq: Once | OPHTHALMIC | Status: AC
  Administered 2017-09-13: 1 via OPHTHALMIC
  Filled 2017-09-13: qty 1

## 2017-09-13 NOTE — Discharge Instructions (Signed)
Please read and follow all provided instructions.  Your diagnoses today include:  1. Pain of right eye   2. Abrasion of right cornea, initial encounter     Tests performed today include: Visual acuity testing to check your vision Fluorescein dye examination to look for scratches on your eye Tonometry to check the pressure inside of your eye Vital signs. See below for your results today.   Medications prescribed:   Take any prescribed medications only as directed.  Please apply 1 drop of the antibiotic into the right eye every 4 hours for the first 2 days.  Subsequently, you may space this out to every 6 hours for the next 3-5 days until the foreign body sensation is gone.  Home care instructions:  Follow any educational materials contained in this packet. If you wear contact lenses, do not use them until your eye caregiver approves. Follow-up care is necessary to be sure the infection is healing if not completely resolved in 2-3 days. See your caregiver or eye specialist as suggested for followup.   Please ensure that you are not rubbing your eye.  Follow-up instructions: Please follow-up with the opthalmologist listed in the next 2-3 days for further evaluation of your symptoms.  Return instructions:  Please return to the Emergency Department if you experience worsening symptoms.  Please return immediately if you develop severe pain, pus drainage, new change in vision, or fever. Please return if you have any other emergent concerns.  Additional Information:  Your vital signs today were: BP 116/70 (BP Location: Right Arm)    Pulse 89    Temp 98.2 F (36.8 C) (Oral)    Resp 20    Ht 5\' 5"  (1.651 m)    Wt 76.2 kg (168 lb)    LMP 08/12/2017    SpO2 100%    BMI 27.96 kg/m  If your blood pressure (BP) was elevated above 130/80 this visit, please have this repeated by your doctor within one month. ---------------
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
## 2017-09-13 NOTE — ED Notes (Signed)
1x call back for fast track 
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
## 2017-11-24 ENCOUNTER — Other Ambulatory Visit: Payer: Self-pay

## 2017-11-24 ENCOUNTER — Encounter (HOSPITAL_COMMUNITY): Payer: Self-pay | Admitting: Emergency Medicine

## 2017-11-24 ENCOUNTER — Ambulatory Visit (HOSPITAL_COMMUNITY)
Admission: EM | Admit: 2017-11-24 | Discharge: 2017-11-24 | Disposition: A | Discharge status: Home / Self Care | Payer: BLUE CROSS/BLUE SHIELD | Attending: Urgent Care | Admitting: Urgent Care

## 2017-11-24 DIAGNOSIS — R112 Nausea with vomiting, unspecified: Secondary | ICD-10-CM

## 2017-11-24 DIAGNOSIS — J111 Influenza due to unidentified influenza virus with other respiratory manifestations: Secondary | ICD-10-CM

## 2017-11-24 DIAGNOSIS — R51 Headache: Secondary | ICD-10-CM

## 2017-11-24 DIAGNOSIS — R059 Cough, unspecified: Secondary | ICD-10-CM

## 2017-11-24 DIAGNOSIS — R197 Diarrhea, unspecified: Secondary | ICD-10-CM

## 2017-11-24 DIAGNOSIS — R519 Headache, unspecified: Secondary | ICD-10-CM

## 2017-11-24 DIAGNOSIS — R0789 Other chest pain: Secondary | ICD-10-CM

## 2017-11-24 DIAGNOSIS — R69 Illness, unspecified: Secondary | ICD-10-CM | POA: Diagnosis not present

## 2017-11-24 DIAGNOSIS — R52 Pain, unspecified: Secondary | ICD-10-CM

## 2017-11-24 DIAGNOSIS — R05 Cough: Secondary | ICD-10-CM

## 2017-11-24 DIAGNOSIS — E1165 Type 2 diabetes mellitus with hyperglycemia: Secondary | ICD-10-CM

## 2017-11-24 MED ORDER — OSELTAMIVIR PHOSPHATE 75 MG PO CAPS: 75.0000 mg | ORAL_CAPSULE | Freq: Two times a day (BID) | ORAL | 0 refills | Status: DC

## 2017-11-24 MED ORDER — BENZONATATE 100 MG PO CAPS: 100.0000 mg | ORAL_CAPSULE | Freq: Three times a day (TID) | ORAL | 0 refills | Status: DC | PRN

## 2017-11-24 MED ORDER — ALBUTEROL SULFATE HFA 108 (90 BASE) MCG/ACT IN AERS: 1.0000 | INHALATION_SPRAY | Freq: Four times a day (QID) | RESPIRATORY_TRACT | 0 refills | Status: DC | PRN

## 2017-11-24 MED ORDER — ONDANSETRON 8 MG PO TBDP: 8.0000 mg | ORAL_TABLET | Freq: Three times a day (TID) | ORAL | 0 refills | Status: DC | PRN

## 2017-11-24 MED ORDER — HYDROCODONE-HOMATROPINE 5-1.5 MG/5ML PO SYRP: 5.0000 mL | ORAL_SOLUTION | Freq: Every evening | ORAL | 0 refills | Status: DC | PRN

## 2017-11-24 NOTE — Discharge Instructions (Signed)
Hydrate well with at least 2 liters (1 gallon) of water daily. You may take 500mg  Tylenol with ibuprofen 400mg  every 6 hours for pain and inflammation.   [facility] Millena Callins, [place] [phone]

## 2017-11-24 NOTE — ED Provider Notes (Addendum)
  MRN: [id number] DOB: April 03, 1976  Subjective:   Stacey Lucas is a 42 y.o. female presenting for 2 day history of nausea with vomiting (2 episodes), diarrhea (5 BM) since last night. Has also had a headache, productive cough, subjective fever, sinus congestion, right ear pain, body aches. Cough elicits chest pain, shob. Has tried Mucinex, Thera-flu with minimal relief. Patient has uncontrolled type 2 diabetes, not currently on medications. Denies dizziness, confusion, rashes, dysuria, hematuria. Smokes 1ppd. Patient did receive flu shot 08/2017.  Stacey Lucas is not currently taking any medications and has No Known Allergies.  Stacey Lucas  has a past medical history of Diabetes mellitus without complication (HCC). Also  has a past surgical history that includes tubal. Family history if positive for diabetes and HTN.    Objective:   Vitals: BP 127/79 (BP Location: Left Arm)   Pulse (!) 105   Temp 98.2 F (36.8 C) (Oral)   Resp 20   LMP 11/03/2017 (Exact Date)   SpO2 97%   Physical Exam  Constitutional: She is oriented to person, place, and time. She appears well-developed and well-nourished.  HENT:  Right Ear: Tympanic membrane normal.  Left Ear: Tympanic membrane normal.  Nose: Sinus tenderness present.  Mouth/Throat: Oropharynx is clear and moist.  Eyes: EOM are normal. Pupils are equal, round, and reactive to light. Right eye exhibits no discharge. Left eye exhibits no discharge. No scleral icterus.  Neck: Normal range of motion. Neck supple.  Cardiovascular: Normal rate, regular rhythm and intact distal pulses. Exam reveals no gallop and no friction rub.  No murmur heard. Pulmonary/Chest: Effort normal. No respiratory distress. She has no wheezes. She has no rales.  Abdominal: Soft. Bowel sounds are normal. She exhibits no distension and no mass. There is tenderness (generalized throughout). There is no guarding.  Musculoskeletal: She exhibits no edema.  Lymphadenopathy:    She has no  cervical adenopathy.  Neurological: She is alert and oriented to person, place, and time.  Skin: Skin is warm and dry. No rash noted. No erythema. No pallor.  Psychiatric: She has a normal mood and affect.   Assessment and Plan :   Influenza-like illness  Cough  Body aches  Generalized headaches  Atypical chest pain  Nausea and vomiting, intractability of vomiting not specified, unspecified vomiting type  Diarrhea, unspecified type  Uncontrolled type 2 diabetes mellitus with hyperglycemia (HCC)  Will start Tamiflu for patient given her diabetes and presentation. Physical exam findings reassuring. Start supportive care. Counseled patient on potential for adverse effects with medications prescribed today, patient verbalized understanding. Return-to-clinic precautions discussed, patient verbalized understanding. Offered help with her diabetes, patient will consider setting up primary care.   Wallis BambergMani, Danyella Mcginty, PA-C 11/24/17 1051    Wallis BambergMani, Aspin Palomarez, [place] 11/24/17 1051

## 2017-11-24 NOTE — ED Triage Notes (Signed)
[facility] a [facility], headache, N/V/d x 3 days.

## 2017-11-27 ENCOUNTER — Telehealth: Payer: Self-pay | Admitting: Urgent Care

## 2017-11-27 ENCOUNTER — Ambulatory Visit: Payer: BC Managed Care – PPO | Admitting: Urgent Care

## 2017-11-27 ENCOUNTER — Encounter: Payer: Self-pay | Admitting: Urgent Care

## 2017-11-27 VITALS — BP 155/87 | HR 87 | Temp 98.7°F | Resp 18 | Ht 65.0 in | Wt 165.4 lb

## 2017-11-27 DIAGNOSIS — E1165 Type 2 diabetes mellitus with hyperglycemia: Secondary | ICD-10-CM

## 2017-11-27 DIAGNOSIS — F17200 Nicotine dependence, unspecified, uncomplicated: Secondary | ICD-10-CM | POA: Diagnosis not present

## 2017-11-27 DIAGNOSIS — I10 Essential (primary) hypertension: Secondary | ICD-10-CM | POA: Diagnosis not present

## 2017-11-27 DIAGNOSIS — IMO0001 Reserved for inherently not codable concepts without codable children: Secondary | ICD-10-CM

## 2017-11-27 MED ORDER — LISINOPRIL 5 MG PO TABS: 5.0000 mg | ORAL_TABLET | Freq: Every day | ORAL | 3 refills | Status: DC

## 2017-11-27 MED ORDER — BLOOD GLUCOSE MONITOR KIT: PACK | 0 refills | Status: DC

## 2017-11-27 MED ORDER — ATORVASTATIN CALCIUM 20 MG PO TABS: 20.0000 mg | ORAL_TABLET | Freq: Every day | ORAL | 3 refills | Status: DC

## 2017-11-27 NOTE — Telephone Encounter (Signed)
Copied from CRM [id number]. Topic: Quick Communication - Rx Refill/Question >> Nov 27, 2017  5:50 PM Alexander BergeronBarksdale, Harvey B wrote: Pt states she was suppose to get a Rx called in for her today for a yeast infection that was not called in by Dr. Urban GibsonMani, contact pt to advise, pt does not know the name b/c pt states physician didn't give the name

## 2017-11-27 NOTE — Patient Instructions (Addendum)
Salads - kale, spinach, cabbage, spring mix; use seeds like pumpkin seeds or sunflower seeds; you can also use 1-2 hard boiled eggs. Fruits - avocadoes, berries (blueberries, raspberries, blackberries), apples, oranges, pomegranate, grapefruit Seeds - quinoa, chia seeds; you can also incorporate oatmeal Vegetables - aspargus, cauliflower, broccoli, green beans, brussel spouts, bell peppers; stay away from starchy vegetables like potatoes, carrots, peas     Diabetes Mellitus and Nutrition When you have diabetes (diabetes mellitus), it is very important to have healthy eating habits because your blood sugar (glucose) levels are greatly affected by what you eat and drink. Eating healthy foods in the appropriate amounts, at about the same times every day, can help you:  Control your blood glucose.  Lower your risk of heart disease.  Improve your blood pressure.  Reach or maintain a healthy weight.  Every person with diabetes is different, and each person has different needs for a meal plan. Your health care provider may recommend that you work with a diet and nutrition specialist (dietitian) to make a meal plan that is best for you. Your meal plan may vary depending on factors such as:  The calories you need.  The medicines you take.  Your weight.  Your blood glucose, blood pressure, and cholesterol levels.  Your activity level.  Other health conditions you have, such as heart or kidney disease.  How do carbohydrates affect me? Carbohydrates affect your blood glucose level more than any other type of food. Eating carbohydrates naturally increases the amount of glucose in your blood. Carbohydrate counting is a method for keeping track of how many carbohydrates you eat. Counting carbohydrates is important to keep your blood glucose at a healthy level, especially if you use insulin or take certain oral diabetes medicines. It is important to know how many carbohydrates you can safely have  in each meal. This is different for every person. Your dietitian can help you calculate how many carbohydrates you should have at each meal and for snack. Foods that contain carbohydrates include:  Bread, cereal, rice, pasta, and crackers.  Potatoes and corn.  Peas, beans, and lentils.  Milk and yogurt.  Fruit and juice.  Desserts, such as cakes, cookies, ice cream, and candy.  How does alcohol affect me? Alcohol can cause a sudden decrease in blood glucose (hypoglycemia), especially if you use insulin or take certain oral diabetes medicines. Hypoglycemia can be a life-threatening condition. Symptoms of hypoglycemia (sleepiness, dizziness, and confusion) are similar to symptoms of having too much alcohol. If your health care provider says that alcohol is safe for you, follow these guidelines:  Limit alcohol intake to no more than 1 drink per day for nonpregnant women and 2 drinks per day for men. One drink equals 12 oz of beer, 5 oz of wine, or 1 oz of hard liquor.  Do not drink on an empty stomach.  Keep yourself hydrated with water, diet soda, or unsweetened iced tea.  Keep in mind that regular soda, juice, and other mixers may contain a lot of sugar and must be counted as carbohydrates.  What are tips for following this plan? Reading food labels  Start by checking the serving size on the label. The amount of calories, carbohydrates, fats, and other nutrients listed on the label are based on one serving of the food. Many foods contain more than one serving per package.  Check the total grams (g) of carbohydrates in one serving. You can calculate the number of servings of carbohydrates in one  serving by dividing the total carbohydrates by 15. For example, if a food has 30 g of total carbohydrates, it would be equal to 2 servings of carbohydrates.  Check the number of grams (g) of saturated and trans fats in one serving. Choose foods that have low or no amount of these  fats.  Check the number of milligrams (mg) of sodium in one serving. Most people should limit total sodium intake to less than 2,300 mg per day.  Always check the nutrition information of foods labeled as "low-fat" or "nonfat". These foods may be higher in added sugar or refined carbohydrates and should be avoided.  Talk to your dietitian to identify your daily goals for nutrients listed on the label. Shopping  Avoid buying canned, premade, or processed foods. These foods tend to be high in fat, sodium, and added sugar.  Shop around the outside edge of the grocery store. This includes fresh fruits and vegetables, bulk grains, fresh meats, and fresh dairy. Cooking  Use low-heat cooking methods, such as baking, instead of high-heat cooking methods like deep frying.  Cook using healthy oils, such as olive, canola, or sunflower oil.  Avoid cooking with butter, cream, or high-fat meats. Meal planning  Eat meals and snacks regularly, preferably at the same times every day. Avoid going long periods of time without eating.  Eat foods high in fiber, such as fresh fruits, vegetables, beans, and whole grains. Talk to your dietitian about how many servings of carbohydrates you can eat at each meal.  Eat 4-6 ounces of lean protein each day, such as lean meat, chicken, fish, eggs, or tofu. 1 ounce is equal to 1 ounce of meat, chicken, or fish, 1 egg, or 1/4 cup of tofu.  Eat some foods each day that contain healthy fats, such as avocado, nuts, seeds, and fish. Lifestyle   Check your blood glucose regularly.  Exercise at least 30 minutes 5 or more days each week, or as told by your health care provider.  Take medicines as told by your health care provider.  Do not use any products that contain nicotine or tobacco, such as cigarettes and e-cigarettes. If you need help quitting, ask your health care provider.  Work with a Veterinary surgeon or diabetes educator to identify strategies to manage stress  and any emotional and social challenges. What are some questions to ask my health care provider?  Do I need to meet with a diabetes educator?  Do I need to meet with a dietitian?  What number can I call if I have questions?  When are the best times to check my blood glucose? Where to find more information:  American Diabetes Association: diabetes.org/food-and-fitness/food  Academy of Nutrition and Dietetics: https://www.vargas.com/  [employer] of Diabetes and Digestive and Kidney Diseases (NIH): FindJewelers.cz Summary  A healthy meal plan will help you control your blood glucose and maintain a healthy lifestyle.  Working with a diet and nutrition specialist (dietitian) can help you make a meal plan that is best for you.  Keep in mind that carbohydrates and alcohol have immediate effects on your blood glucose levels. It is important to count carbohydrates and to use alcohol carefully. This information is not intended to replace advice given to you by your health care provider. Make sure you discuss any questions you have with your health care provider. Document Released: 05/23/2005 Document Revised: 09/30/2016 Document Reviewed: 09/30/2016 Elsevier Interactive Patient Education  [employer].    IF you received an x-ray today,  you will receive an invoice from [facility]. Please contact [facility] at [phone] with questions or concerns regarding your invoice.   IF you received labwork today, you will receive an invoice from [facility]. Please contact [facility] at [phone] with questions or concerns regarding your invoice.   Our billing staff will not be able to assist you with questions regarding bills from these companies.  You will be contacted with the lab results as soon as they are available. The fastest way to get your results  is to activate your My Chart account. Instructions are located on the last page of this paperwork. If you have not heard from us regarding the results in 2 weeks, please contact this office.

## 2017-11-27 NOTE — Progress Notes (Signed)
    MRN: [id number] DOB: May 19, 1976  Subjective:   Stacey Lucas is a 42 y.o. female presenting for follow up on diabetes, history of HTN. Currently out of medications. Has not had her medications in ~1 year. Diet is not compliant, does not exercise. She is still getting over the flu, has residual cough. Denies dizziness, chronic headache, blurred vision, chest pain, shortness of breath, heart racing, palpitations, nausea, vomiting, abdominal pain, hematuria, lower leg swelling. Smokes 1 ppd. She does have patches. She has not taken the flu shot this season.  Stacey Lucas is taking Tamiflu, Hycodan, Tessalon and has No Known Allergies.  Stacey Lucas  has a past medical history of Diabetes mellitus without complication (HCC). Denies past surgical history. Her family history includes Arthritis in her mother; Diabetes in her father and mother; Hypertension in her father and mother.   Objective:   Vitals: BP (!) 155/87   Pulse 87   Temp 98.7 F (37.1 C) (Oral)   Resp 18   Ht 5\' 5"  (1.651 m)   Wt 165 lb 6.4 oz (75 kg)   LMP 11/03/2017 (Exact Date)   SpO2 100%   BMI 27.52 kg/m   BP Readings from Last 3 Encounters:  11/27/17 (!) 155/87  11/24/17 127/79  09/13/17 130/74   Physical Exam  Constitutional: She is oriented to person, place, and time. She appears well-developed and well-nourished.  HENT:  Mouth/Throat: Oropharynx is clear and moist.  Eyes: Pupils are equal, round, and reactive to light. EOM are normal. No scleral icterus.  Neck: Normal range of motion. Neck supple. No thyromegaly present.  Cardiovascular: Normal rate, regular rhythm and intact distal pulses. Exam reveals no gallop and no friction rub.  No murmur heard. Pulmonary/Chest: No respiratory distress. She has no wheezes. She has no rales.  Abdominal: Soft. Bowel sounds are normal. She exhibits no distension and no mass. There is no tenderness. There is no guarding.  Musculoskeletal: She exhibits no edema.  Neurological: She is  alert and oriented to person, place, and time.  Skin: Skin is warm and dry. No rash noted.  Psychiatric: She has a normal mood and affect.   Assessment and Plan :   Uncontrolled type 2 diabetes mellitus without complication (HCC) - Plan: Hemoglobin A1c, Comprehensive metabolic panel, TSH  Essential hypertension  Tobacco use disorder  Will start patient on lisinopril, atorvastatin. Labs pending, discussed lifestyle modifications for her diabetes. Will await labs to start her diabetes medications to make sure we have values on her blood sugar and kidney function. Discussed diabetes management. Will plan to follow up with lab results.  Wallis BambergMario Deandra Gadson, PA-C [facility] [phone] 11/27/2017  3:52 PM

## 2017-11-28 ENCOUNTER — Ambulatory Visit (INDEPENDENT_AMBULATORY_CARE_PROVIDER_SITE_OTHER): Payer: BC Managed Care – PPO | Admitting: Urgent Care

## 2017-11-28 ENCOUNTER — Other Ambulatory Visit: Payer: Self-pay | Admitting: Urgent Care

## 2017-11-28 ENCOUNTER — Telehealth: Payer: Self-pay

## 2017-11-28 ENCOUNTER — Encounter: Payer: Self-pay | Admitting: Urgent Care

## 2017-11-28 VITALS — BP 112/72 | HR 86 | Temp 98.0°F | Resp 18 | Ht 66.5 in | Wt 164.0 lb

## 2017-11-28 DIAGNOSIS — R739 Hyperglycemia, unspecified: Secondary | ICD-10-CM

## 2017-11-28 DIAGNOSIS — G44209 Tension-type headache, unspecified, not intractable: Secondary | ICD-10-CM

## 2017-11-28 DIAGNOSIS — I10 Essential (primary) hypertension: Secondary | ICD-10-CM

## 2017-11-28 DIAGNOSIS — E1165 Type 2 diabetes mellitus with hyperglycemia: Secondary | ICD-10-CM

## 2017-11-28 LAB — COMPREHENSIVE METABOLIC PANEL
ALT: 6 IU/L (ref 0–32)
AST: 17 IU/L (ref 0–40)
Albumin/Globulin Ratio: 1.8 (ref 1.2–2.2)
Albumin: 4.2 g/dL (ref 3.5–5.5)
Alkaline Phosphatase: 108 IU/L (ref 39–117)
BUN/Creatinine Ratio: 11 (ref 9–23)
BUN: 12 mg/dL (ref 6–24)
Bilirubin Total: <0.2 mg/dL (ref 0.0–1.2)
CALCIUM: 9.2 mg/dL (ref 8.7–10.2)
CHLORIDE: 91 mmol/L — AB (ref 96–106)
CO2: 21 mmol/L (ref 20–29)
CREATININE: 1.1 mg/dL — AB (ref 0.57–1.00)
GFR, EST AFRICAN AMERICAN: 72 mL/min/{1.73_m2} (ref >59)
GFR, EST NON AFRICAN AMERICAN: 62 mL/min/{1.73_m2} (ref >59)
GLUCOSE: 611 mg/dL — AB (ref 65–99)
Globulin, Total: 2.4 g/dL (ref 1.5–4.5)
Potassium: 4.6 mmol/L (ref 3.5–5.2)
Sodium: 127 mmol/L — ABNORMAL LOW (ref 134–144)
TOTAL PROTEIN: 6.6 g/dL (ref 6.0–8.5)

## 2017-11-28 LAB — GLUCOSE, POCT (MANUAL RESULT ENTRY)

## 2017-11-28 LAB — POCT GLYCOSYLATED HEMOGLOBIN (HGB A1C): Hemoglobin A1C: 14

## 2017-11-28 LAB — HEMOGLOBIN A1C: Hgb A1c MFr Bld: >15.5 % — ABNORMAL HIGH (ref 4.8–5.6)

## 2017-11-28 LAB — TSH: TSH: 0.507 u[IU]/mL (ref 0.450–4.500)

## 2017-11-28 MED ORDER — INSULIN GLARGINE 100 UNIT/ML SOLOSTAR PEN: 20.0000 [IU] | PEN_INJECTOR | Freq: Every day | SUBCUTANEOUS | 11 refills | Status: DC

## 2017-11-28 MED ORDER — FLUCONAZOLE 150 MG PO TABS: 150.0000 mg | ORAL_TABLET | ORAL | 0 refills | Status: DC

## 2017-11-28 MED ORDER — METFORMIN HCL 1000 MG PO TABS: 1000.0000 mg | ORAL_TABLET | Freq: Two times a day (BID) | ORAL | 3 refills | Status: DC

## 2017-11-28 MED ORDER — SODIUM CHLORIDE 0.9 % IV BOLUS (SEPSIS)
1000.0000 mL | Freq: Once | INTRAVENOUS | Status: AC
  Administered 2017-11-28: 1000 mL via INTRAVENOUS

## 2017-11-28 MED ORDER — KETOROLAC TROMETHAMINE 60 MG/2ML IM SOLN
60.0000 mg | Freq: Once | INTRAMUSCULAR | Status: AC
  Administered 2017-11-28: 60 mg via INTRAMUSCULAR

## 2017-11-28 MED ORDER — SITAGLIPTIN PHOSPHATE 100 MG PO TABS: 100.0000 mg | ORAL_TABLET | Freq: Every day | ORAL | 1 refills | Status: DC

## 2017-11-28 NOTE — Progress Notes (Signed)
   MRN: [id number] DOB: Jan 07, 1976  Subjective:   Stacey Lucas is a 42 y.o. female presenting for follow up on uncontrolled type 2 diabetes mellitus. Patient was seen yesterday, was asymptomatic. Received urgent alert for severely elevated blood sugar of 611. Home blood sugar check this morning was 528. Currently, patient has residual cough from the flu that elicits chest discomfort. Denies fever, confusion, dizziness, shob, heart racing, n/v, abdominal pain, skin infections. She is a smoker, smokes 1ppd.  Stacey Lucas has a current medication list which includes the following prescription(s): atorvastatin, benzonatate, blood glucose meter kit and supplies, fluconazole, hydrocodone-homatropine, lisinopril, ondansetron, and oseltamivir. Also has No Known Allergies.  Stacey Lucas  has a past medical history of Diabetes mellitus without complication ([place]). Also  has a past surgical history that includes tubal.  Objective:   Vitals: BP 112/72   Pulse 86   Temp 98 F (36.7 C) (Oral)   Resp 18   Ht 5' 6.5" (1.689 m)   Wt 164 lb (74.4 kg)   LMP 11/03/2017 (Exact Date)   SpO2 98%   BMI 26.07 kg/m   Physical Exam  Constitutional: She is oriented to person, place, and time. She appears well-developed and well-nourished.  HENT:  Mouth/Throat: Oropharynx is clear and moist.  Eyes: Pupils are equal, round, and reactive to light. No scleral icterus.  Cardiovascular: Normal rate, regular rhythm and intact distal pulses. Exam reveals no gallop and no friction rub.  No murmur heard. Pulmonary/Chest: No respiratory distress. She has no wheezes. She has no rales.  Abdominal: Soft. Bowel sounds are normal. She exhibits no distension and no mass. There is no tenderness. There is no guarding.  Neurological: She is alert and oriented to person, place, and time.  Skin: Skin is warm and dry.  Psychiatric: She has a normal mood and affect.   Results for orders placed or performed in visit on 11/28/17 (from the past 24  hour(s))  POCT glucose (manual entry)     Status: None   Collection Time: 11/28/17 10:19 AM  Result Value Ref Range   POC Glucose HHH 70 - 99 mg/dl  POCT glycosylated hemoglobin (Hb A1C)     Status: None   Collection Time: 11/28/17 10:19 AM  Result Value Ref Range   Hemoglobin A1C 14.0    Assessment and Plan :   Uncontrolled type 2 diabetes mellitus with hyperglycemia (HCC) - Plan: POCT glucose (manual entry), POCT glycosylated hemoglobin (Hb A1C), Insert peripheral IV, sodium chloride 0.9 % bolus 1,000 mL, sodium chloride 0.9 % bolus 1,000 mL, CANCELED: POCT urinalysis dipstick  Hyperglycemia - Plan: Insert peripheral IV, sodium chloride 0.9 % bolus 1,000 mL, sodium chloride 0.9 % bolus 1,000 mL  Essential hypertension - Plan: CANCELED: EKG 12-Lead  Acute non intractable tension-type headache - Plan: ketorolac (TORADOL) injection 60 mg  Unfortunately, we no longer stock insulin in our clinic. However, I was able to provide IV fluids to patient. Counseled her extensively on need for dietary modifications. She will start Lantus tonight. Plan is to start her on metformin, sitagliptin. Recheck tomorrow. Strict ER precautions were given to the patient.   Jaynee Eagles, PA-C [facility] [phone] 11/28/2017 9:43 AM

## 2017-11-28 NOTE — Telephone Encounter (Signed)
Fluconazole sent to [facility] [phone] - [place], [place] - [address] AT [address] today, patient also coming in for office visit as soon as she is able.   Closing note.

## 2017-11-28 NOTE — Patient Instructions (Addendum)
Metformin Dosing (to be taken with food) Week 1: take 1/2 tablet twice a day. Week 2: take 1 tablet in the morning, 1/2 tablet at night. Week 3: take 1 tablets twice a day.      Diabetes Mellitus and Nutrition When you have diabetes (diabetes mellitus), it is very important to have healthy eating habits because your blood sugar (glucose) levels are greatly affected by what you eat and drink. Eating healthy foods in the appropriate amounts, at about the same times every day, can help you:  Control your blood glucose.  Lower your risk of heart disease.  Improve your blood pressure.  Reach or maintain a healthy weight.  Every person with diabetes is different, and each person has different needs for a meal plan. Your health care provider may recommend that you work with a diet and nutrition specialist (dietitian) to make a meal plan that is best for you. Your meal plan may vary depending on factors such as:  The calories you need.  The medicines you take.  Your weight.  Your blood glucose, blood pressure, and cholesterol levels.  Your activity level.  Other health conditions you have, such as heart or kidney disease.  How do carbohydrates affect me? Carbohydrates affect your blood glucose level more than any other type of food. Eating carbohydrates naturally increases the amount of glucose in your blood. Carbohydrate counting is a method for keeping track of how many carbohydrates you eat. Counting carbohydrates is important to keep your blood glucose at a healthy level, especially if you use insulin or take certain oral diabetes medicines. It is important to know how many carbohydrates you can safely have in each meal. This is different for every person. Your dietitian can help you calculate how many carbohydrates you should have at each meal and for snack. Foods that contain carbohydrates include:  Bread, cereal, rice, pasta, and crackers.  Potatoes and corn.  Peas, beans,  and lentils.  Milk and yogurt.  Fruit and juice.  Desserts, such as cakes, cookies, ice cream, and candy.  How does alcohol affect me? Alcohol can cause a sudden decrease in blood glucose (hypoglycemia), especially if you use insulin or take certain oral diabetes medicines. Hypoglycemia can be a life-threatening condition. Symptoms of hypoglycemia (sleepiness, dizziness, and confusion) are similar to symptoms of having too much alcohol. If your health care provider says that alcohol is safe for you, follow these guidelines:  Limit alcohol intake to no more than 1 drink per day for nonpregnant women and 2 drinks per day for men. One drink equals 12 oz of beer, 5 oz of wine, or 1 oz of hard liquor.  Do not drink on an empty stomach.  Keep yourself hydrated with water, diet soda, or unsweetened iced tea.  Keep in mind that regular soda, juice, and other mixers may contain a lot of sugar and must be counted as carbohydrates.  What are tips for following this plan? Reading food labels  Start by checking the serving size on the label. The amount of calories, carbohydrates, fats, and other nutrients listed on the label are based on one serving of the food. Many foods contain more than one serving per package.  Check the total grams (g) of carbohydrates in one serving. You can calculate the number of servings of carbohydrates in one serving by dividing the total carbohydrates by 15. For example, if a food has 30 g of total carbohydrates, it would be equal to 2 servings of carbohydrates.  Check the number of grams (g) of saturated and trans fats in one serving. Choose foods that have low or no amount of these fats.  Check the number of milligrams (mg) of sodium in one serving. Most people should limit total sodium intake to less than 2,300 mg per day.  Always check the nutrition information of foods labeled as "low-fat" or "nonfat". These foods may be higher in added sugar or refined  carbohydrates and should be avoided.  Talk to your dietitian to identify your daily goals for nutrients listed on the label. Shopping  Avoid buying canned, premade, or processed foods. These foods tend to be high in fat, sodium, and added sugar.  Shop around the outside edge of the grocery store. This includes fresh fruits and vegetables, bulk grains, fresh meats, and fresh dairy. Cooking  Use low-heat cooking methods, such as baking, instead of high-heat cooking methods like deep frying.  Cook using healthy oils, such as olive, canola, or sunflower oil.  Avoid cooking with butter, cream, or high-fat meats. Meal planning  Eat meals and snacks regularly, preferably at the same times every day. Avoid going long periods of time without eating.  Eat foods high in fiber, such as fresh fruits, vegetables, beans, and whole grains. Talk to your dietitian about how many servings of carbohydrates you can eat at each meal.  Eat 4-6 ounces of lean protein each day, such as lean meat, chicken, fish, eggs, or tofu. 1 ounce is equal to 1 ounce of meat, chicken, or fish, 1 egg, or 1/4 cup of tofu.  Eat some foods each day that contain healthy fats, such as avocado, nuts, seeds, and fish. Lifestyle   Check your blood glucose regularly.  Exercise at least 30 minutes 5 or more days each week, or as told by your health care provider.  Take medicines as told by your health care provider.  Do not use any products that contain nicotine or tobacco, such as cigarettes and e-cigarettes. If you need help quitting, ask your health care provider.  Work with a Veterinary surgeon or diabetes educator to identify strategies to manage stress and any emotional and social challenges. What are some questions to ask my health care provider?  Do I need to meet with a diabetes educator?  Do I need to meet with a dietitian?  What number can I call if I have questions?  When are the best times to check my blood  glucose? Where to find more information:  American Diabetes Association: diabetes.org/food-and-fitness/food  Academy of Nutrition and Dietetics: https://www.vargas.com/  [employer] of Diabetes and Digestive and Kidney Diseases (NIH): FindJewelers.cz Summary  A healthy meal plan will help you control your blood glucose and maintain a healthy lifestyle.  Working with a diet and nutrition specialist (dietitian) can help you make a meal plan that is best for you.  Keep in mind that carbohydrates and alcohol have immediate effects on your blood glucose levels. It is important to count carbohydrates and to use alcohol carefully. This information is not intended to replace advice given to you by your health care provider. Make sure you discuss any questions you have with your health care provider. Document Released: 05/23/2005 Document Revised: 09/30/2016 Document Reviewed: 09/30/2016 Elsevier Interactive Patient Education  2018 [employer].       IF you received an x-ray today, you will receive an invoice from [facility]. Please contact [facility] at [phone] with questions or concerns regarding your invoice.   IF you received  labwork today, you will receive an invoice from [employer] [facility]. Please contact [facility] at [phone] with questions or concerns regarding your invoice.   Our billing staff will not be able to assist you with questions regarding bills from these companies.  You will be contacted with the lab results as soon as they are available. The fastest way to get your results is to activate your My Chart account. Instructions are located on the last page of this paperwork. If you have not heard from [place] regarding the results in 2 weeks, please contact this office.

## 2017-11-28 NOTE — Telephone Encounter (Signed)
Copied from CRM [phone]. Topic: General - Call Back - No Documentation >> Nov 28, 2017  8:41 AM Stacey KalataMichael, Taylor L, NT wrote: Patient states she missed a call. Please advise.

## 2017-11-28 NOTE — Progress Notes (Signed)
Per verbal orders from WebbMani, requested drip rate be slowed to 100 drops per minute with additional bag of IV fluids. Started 5% dextrose and 0.9% Sodium Chloride at 11:00am at 100 drops per minute. Discontinued at 11:15am, 0.9% sodium chloride started at 100 drops per minute, discussed with provider.

## 2017-11-28 NOTE — Telephone Encounter (Signed)
Chart review: "Notes recorded by Wallis BambergMani, Mario, PA-C on 11/28/2017 at 8:42 AM EDT We need to have this patient come in asap to start an IV, run a urinalysis and make sure she does not have a medical emergency from her severely elevated blood pressure."  Phone call to patient. Notified of above message. She states her blood sugars were high this morning at 582. She states she will come in after 1:30 after she is off work. Advised patient provider and this RN are worried about her blood sugars being this high. She states she will come in as soon as possible.

## 2017-11-29 ENCOUNTER — Ambulatory Visit: Payer: BC Managed Care – PPO | Admitting: Urgent Care

## 2017-12-04 ENCOUNTER — Telehealth: Payer: Self-pay | Admitting: *Deleted

## 2017-12-04 NOTE — Telephone Encounter (Signed)
PA SUBMITTED 12/04/2017 24-72 BUSINESS HOURS

## 2017-12-09 ENCOUNTER — Telehealth: Payer: Self-pay

## 2017-12-09 ENCOUNTER — Other Ambulatory Visit: Payer: Self-pay | Admitting: Urgent Care

## 2017-12-09 MED ORDER — BASAGLAR KWIKPEN 100 UNIT/ML ~~LOC~~ SOPN: 20.0000 [IU] | PEN_INJECTOR | Freq: Every day | SUBCUTANEOUS | 1 refills | Status: DC

## 2017-12-09 NOTE — Telephone Encounter (Signed)
Script sent in for [employer]. Please notify patient.

## 2017-12-09 NOTE — Addendum Note (Signed)
Addended by: Wallis BambergMANI, Jamarco Zaldivar on: 12/09/2017 01:55 PM   Modules accepted: Orders

## 2017-12-09 NOTE — Telephone Encounter (Signed)
PA for Lantus was denied.  Per [facility], pt must try Levemir, Evaristo Buryresiba, and Basaglar before Lantus will be approved.

## 2017-12-09 NOTE — Telephone Encounter (Signed)
Informed patient.  Voiced understanding 

## 2017-12-10 ENCOUNTER — Telehealth: Payer: Self-pay | Admitting: General Practice

## 2017-12-10 NOTE — Telephone Encounter (Signed)
Copied from CRM #[id number]. Topic: Quick Communication - Rx Refill/Question >> Dec 10, 2017  3:13 PM Landry MellowFoltz, Melissa J wrote: Medication: insulin needles for Insulin Glargine (BASAGLAR KWIKPEN) 100 UNIT/ML SOPN Has the patient contacted their pharmacy? Yes.   (Agent: If no, request that the patient contact the pharmacy for the refill.) Preferred Pharmacy (with phone number or street name): [facility] [address]  Agent: Please be advised that RX refills may take up to 3 business days. We ask that you follow-up with your pharmacy.

## 2017-12-10 NOTE — Telephone Encounter (Signed)
Pt is requesting needles to used with her insulin pen. Basaglar Stephanie CoupKwikpen was prescribed.  Provider: Wallis BambergMario Mani, PA  Pharmacy: [facility] [phone]#[id number]  [address]   LOV  11/27/17   Please review.

## 2017-12-11 NOTE — Telephone Encounter (Signed)
Stacey Lucas, as far as I know you ordered the qwik pen which should not require needles. Just wanted to clarify with you.

## 2017-12-11 NOTE — Telephone Encounter (Signed)
Patient checking status, still does not have needles. Call back [phone]

## 2017-12-12 NOTE — Telephone Encounter (Signed)
It should not require needles to be order in addition. But I am not entirely sure. It is fine to order needles for the [facility] Pckwik pen for her Basaglar if she does not have them.

## 2018-01-01 ENCOUNTER — Ambulatory Visit: Payer: BC Managed Care – PPO | Admitting: Urgent Care

## 2018-01-29 ENCOUNTER — Telehealth: Payer: Self-pay | Admitting: Urgent Care

## 2018-01-29 DIAGNOSIS — E1165 Type 2 diabetes mellitus with hyperglycemia: Secondary | ICD-10-CM

## 2018-01-29 NOTE — Telephone Encounter (Signed)
Pt requesting needles for [employer]. Prescription for needles not on pt's current medication list.    LOV: 11/28/17 Stacey Lucas  [facility] [address]

## 2018-01-29 NOTE — Telephone Encounter (Unsigned)
Copied from CRM [id number]. Topic: Quick Communication - Rx Refill/Question >> Jan 29, 2018  5:41 PM Raquel Sarna wrote: Pt needing needles for insulin pen  [id number] - [place], [place] - [address] AT [facility] [address] [place] [address] Phone: [phone] Fax: [phone]

## 2018-01-30 MED ORDER — PEN NEEDLES 32G X 4 MM MISC: 1.0000 | Freq: Every day | 3 refills | Status: DC

## 2018-01-30 NOTE — Telephone Encounter (Signed)
Phone call to pharmacy. Needle prescription sent for what is currently in stock. Phone call to patient. Notified of pen needle rx sent to pharmacy, she is appreciative.

## 2018-02-27 ENCOUNTER — Ambulatory Visit: Payer: BC Managed Care – PPO | Admitting: Urgent Care

## 2018-04-13 ENCOUNTER — Ambulatory Visit: Payer: BC Managed Care – PPO | Admitting: Physician Assistant

## 2018-04-15 ENCOUNTER — Encounter (HOSPITAL_COMMUNITY): Payer: Self-pay

## 2018-04-15 ENCOUNTER — Other Ambulatory Visit: Payer: Self-pay

## 2018-04-15 ENCOUNTER — Emergency Department (HOSPITAL_COMMUNITY)
Admission: EM | Admit: 2018-04-15 | Discharge: 2018-04-15 | Disposition: A | Discharge status: Home / Self Care | Payer: BC Managed Care – PPO | Attending: Emergency Medicine | Admitting: Emergency Medicine

## 2018-04-15 DIAGNOSIS — F17210 Nicotine dependence, cigarettes, uncomplicated: Secondary | ICD-10-CM | POA: Diagnosis not present

## 2018-04-15 DIAGNOSIS — Z794 Long term (current) use of insulin: Secondary | ICD-10-CM | POA: Diagnosis not present

## 2018-04-15 DIAGNOSIS — M545 Low back pain: Secondary | ICD-10-CM | POA: Diagnosis present

## 2018-04-15 DIAGNOSIS — E119 Type 2 diabetes mellitus without complications: Secondary | ICD-10-CM | POA: Diagnosis not present

## 2018-04-15 DIAGNOSIS — Z79899 Other long term (current) drug therapy: Secondary | ICD-10-CM | POA: Insufficient documentation

## 2018-04-15 DIAGNOSIS — M5431 Sciatica, right side: Secondary | ICD-10-CM | POA: Insufficient documentation

## 2018-04-15 LAB — CBG MONITORING, ED: GLUCOSE-CAPILLARY: 218 mg/dL — AB (ref 70–99)

## 2018-04-15 MED ORDER — KETOROLAC TROMETHAMINE 60 MG/2ML IM SOLN
60.0000 mg | Freq: Once | INTRAMUSCULAR | Status: AC
Start: 2018-04-15 — End: 2018-04-15
  Administered 2018-04-15: 60 mg via INTRAMUSCULAR
  Filled 2018-04-15: qty 2

## 2018-04-15 MED ORDER — MELOXICAM 15 MG PO TABS: 15.0000 mg | ORAL_TABLET | Freq: Every day | ORAL | 0 refills | Status: DC

## 2018-04-15 MED ORDER — DEXAMETHASONE SODIUM PHOSPHATE 10 MG/ML IJ SOLN
10.0000 mg | Freq: Once | INTRAMUSCULAR | Status: AC
  Administered 2018-04-15: 10 mg via INTRAMUSCULAR
  Filled 2018-04-15: qty 1

## 2018-04-15 MED ORDER — CYCLOBENZAPRINE HCL 10 MG PO TABS: 5.0000 mg | ORAL_TABLET | Freq: Two times a day (BID) | ORAL | 0 refills | Status: DC | PRN

## 2018-04-15 NOTE — ED Notes (Signed)
Pt's CBG was 218.

## 2018-04-15 NOTE — ED Triage Notes (Signed)
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 Pt sent here by work for dr's note. VSS. Denies urinary sx.

## 2018-04-15 NOTE — ED Provider Notes (Signed)
[facility] EMERGENCY DEPARTMENT Provider Note   CSN: [id number] Arrival date & time: 04/15/18  1144     History   Chief Complaint Chief Complaint  Patient presents with  . Back Pain    HPI Stacey Lucas is a 42 y.o. female who presents the emergency department chief complaint of back pain.  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  She states that she developed some pain in her right upper back but now she has pain in her right lower back that is radiating down the right leg.  She states that sometimes her right leg will buckle.  She describes the pain as sharp and electrical at times.  She is constant aching.  She has been taking Motrin without relief.  She is a past medical history of poorly controlled diabetes.  She states that she saw Dr. recently and she is taking all of her medications but she is unsure what her blood sugars have been running.  She denies weakness numbness tingling saddle anesthesia or loss of bowel or bladder control.  HPI  Past Medical History:  Diagnosis Date  . Diabetes mellitus without complication ([place])    Type II    There are no active problems to display for this patient.   Past Surgical History:  Procedure Laterality Date  . tubal       OB History   None      Home Medications    Prior to Admission medications   Medication Sig Start Date End Date Taking? Authorizing Provider  atorvastatin (LIPITOR) 20 MG tablet Take 1 tablet (20 mg total) by mouth daily. 11/27/17   Jaynee Eagles, PA-C  benzonatate (TESSALON) 100 MG capsule Take 1-2 capsules (100-200 mg total) by mouth 3 (three) times daily as needed for cough. 11/24/17   Jaynee Eagles, PA-C  blood glucose meter kit and supplies KIT Check fasting blood sugar in the morning. 11/27/17   Jaynee Eagles, PA-C  fluconazole (DIFLUCAN) 150 MG tablet Take 1 tablet (150 mg total) by mouth once a week. 11/28/17   Jaynee Eagles, PA-C    HYDROcodone-homatropine [facility]) 5-1.5 MG/5ML syrup Take 5 mLs by mouth at bedtime as needed. 11/24/17   Jaynee Eagles, PA-C  Insulin Glargine (BASAGLAR KWIKPEN) 100 UNIT/ML SOPN Inject 0.2 mLs (20 Units total) into the skin at bedtime. 12/09/17   Jaynee Eagles, PA-C  Insulin Pen Needle (PEN NEEDLES) 32G X 4 MM MISC 1 each by Does not apply route at bedtime. 01/30/18   Jaynee Eagles, PA-C  lisinopril (PRINIVIL,ZESTRIL) 5 MG tablet Take 1 tablet (5 mg total) by mouth daily. 11/27/17   Jaynee Eagles, PA-C  metFORMIN (GLUCOPHAGE) 1000 MG tablet Take 1 tablet (1,000 mg total) by mouth 2 (two) times daily with a meal. 11/28/17   Jaynee Eagles, PA-C  ondansetron (ZOFRAN-ODT) 8 MG disintegrating tablet Take 1 tablet (8 mg total) by mouth every 8 (eight) hours as needed for nausea. 11/24/17   Jaynee Eagles, PA-C  oseltamivir (TAMIFLU) 75 MG capsule Take 1 capsule (75 mg total) by mouth 2 (two) times daily. 11/24/17   Jaynee Eagles, PA-C  sitaGLIPtin (JANUVIA) 100 MG tablet Take 1 tablet (100 mg total) by mouth daily. 11/28/17   Jaynee Eagles, PA-C    Family History Family History  Problem Relation Age of Onset  . Diabetes Mother   . Hypertension Mother   . Arthritis Mother   . Diabetes Father   . Hypertension Father  Social History Social History   Tobacco Use  . Smoking status: Current Every Day Smoker    Packs/day: 1.00    Types: Cigarettes  . Smokeless tobacco: Never Used  Substance Use Topics  . Alcohol use: Yes    Comment: occasionally  . Drug use: No     Allergies   Patient has no known allergies.   Review of Systems Review of Systems  Ten systems reviewed and are negative for acute change, except as noted in the HPI.   Physical Exam Updated Vital Signs BP 122/75 (BP Location: Left Arm)   Pulse 87   Temp 98 F (36.7 C) (Oral)   Resp 18   Ht '5\' 5"'  (1.651 m)   Wt 74.8 kg (165 lb)   LMP 04/08/2018 (Exact Date)   SpO2 99%   BMI 27.46 kg/m   Physical Exam  Constitutional: She is  oriented to person, place, and time. She appears well-developed and well-nourished. No distress.  HENT:  Head: Normocephalic and atraumatic.  Eyes: Conjunctivae are normal. No scleral icterus.  Neck: Normal range of motion.  Cardiovascular: Normal rate, regular rhythm and normal heart sounds. Exam reveals no gallop and no friction rub.  No murmur heard. Pulmonary/Chest: Effort normal and breath sounds normal. No respiratory distress.  Abdominal: Soft. Bowel sounds are normal. She exhibits no distension and no mass. There is no tenderness. There is no guarding.  Musculoskeletal:  Patient appears to be in mild to moderate pain, antalgic gait noted. Lumbosacral spine area reveals no local tenderness or mass. Painful and reduced LS ROM noted. Straight leg raise is positive at 30 degrees on right. DTR's, motor strength and sensation normal, including heel and toe gait.  Peripheral pulses are palpable.   Neurological: She is alert and oriented to person, place, and time.  Skin: Skin is warm and dry. She is not diaphoretic.  Psychiatric: Her behavior is normal.  Nursing note and vitals reviewed.    ED Treatments / Results  Labs (all labs ordered are listed, but only abnormal results are displayed) Labs Reviewed  CBG MONITORING, ED - Abnormal; Notable for the following components:      Result Value   Glucose-Capillary 218 (*)    All other components within normal limits    EKG None  Radiology No results found.  Procedures Procedures (including critical care time)  Medications Ordered in ED Medications  ketorolac (TORADOL) injection 60 mg (has no administration in time range)     Initial Impression / Assessment and Plan / ED Course  I have reviewed the triage vital signs and the nursing notes.  Pertinent labs & imaging results that were available during my care of the patient were reviewed by me and considered in my medical decision making (see chart for details).      Patient blood sugar at 218.  Given her pain I feel that Decadron and Toradol are warranted.  I did review her previous labs to see if she had any previous diagnosis of renal insufficiency in all of her creatinine clearance is appear to be within normal limits.  Patient given a shot of Toradol and Decadron here.  She is more that her blood sugars may elevate significantly.  Her pain is improved.  She should follow-up with her primary care physician next 2 days.  Will treat with Flexeril and Mobic.  Discussed return precautions.  Final Clinical Impressions(s) / ED Diagnoses   Final diagnoses:  Sciatica of right side    ED  Discharge Orders    None       Margarita Mail, PA-C 04/15/18 1638    Lajean Saver, MD 04/17/18 [phone]

## 2018-04-15 NOTE — Discharge Instructions (Addendum)
Contact a health care provider if:  You have pain that wakes you up when you are sleeping.  You have pain that gets worse when you lie down.  Your pain is worse than you have experienced in the past.  Your pain lasts longer than 4 weeks.  You experience unexplained weight loss.  Get help right away if:  You lose control of your bowel or bladder (incontinence).  You have:  Weakness in your lower back, pelvis, buttocks, or legs that gets worse.  Redness or swelling of your back.  A burning sensation when you urinate.

## 2018-04-24 ENCOUNTER — Other Ambulatory Visit: Payer: Self-pay

## 2018-04-24 ENCOUNTER — Inpatient Hospital Stay (HOSPITAL_COMMUNITY)
Admission: AD | Admit: 2018-04-24 | Discharge: 2018-04-27 | DRG: 885 | Disposition: A | Discharge status: Home / Self Care | Payer: BC Managed Care – PPO | Source: Intra-hospital | Attending: Psychiatry | Admitting: Psychiatry

## 2018-04-24 ENCOUNTER — Emergency Department (HOSPITAL_COMMUNITY)
Admission: EM | Admit: 2018-04-24 | Discharge: 2018-04-24 | Disposition: A | Discharge status: Other Acute Inpatient Hospital | Payer: BC Managed Care – PPO | Attending: Emergency Medicine | Admitting: Emergency Medicine

## 2018-04-24 ENCOUNTER — Encounter (HOSPITAL_COMMUNITY): Payer: Self-pay

## 2018-04-24 DIAGNOSIS — Z79899 Other long term (current) drug therapy: Secondary | ICD-10-CM

## 2018-04-24 DIAGNOSIS — T464X2A Poisoning by angiotensin-converting-enzyme inhibitors, intentional self-harm, initial encounter: Secondary | ICD-10-CM | POA: Insufficient documentation

## 2018-04-24 DIAGNOSIS — T50902A Poisoning by unspecified drugs, medicaments and biological substances, intentional self-harm, initial encounter: Secondary | ICD-10-CM

## 2018-04-24 DIAGNOSIS — T481X2A Poisoning by skeletal muscle relaxants [neuromuscular blocking agents], intentional self-harm, initial encounter: Secondary | ICD-10-CM | POA: Diagnosis not present

## 2018-04-24 DIAGNOSIS — Z833 Family history of diabetes mellitus: Secondary | ICD-10-CM

## 2018-04-24 DIAGNOSIS — Z794 Long term (current) use of insulin: Secondary | ICD-10-CM | POA: Diagnosis not present

## 2018-04-24 DIAGNOSIS — G4700 Insomnia, unspecified: Secondary | ICD-10-CM | POA: Diagnosis present

## 2018-04-24 DIAGNOSIS — F17210 Nicotine dependence, cigarettes, uncomplicated: Secondary | ICD-10-CM | POA: Diagnosis present

## 2018-04-24 DIAGNOSIS — F419 Anxiety disorder, unspecified: Secondary | ICD-10-CM | POA: Diagnosis present

## 2018-04-24 DIAGNOSIS — Z818 Family history of other mental and behavioral disorders: Secondary | ICD-10-CM

## 2018-04-24 DIAGNOSIS — I10 Essential (primary) hypertension: Secondary | ICD-10-CM | POA: Diagnosis present

## 2018-04-24 DIAGNOSIS — Z915 Personal history of self-harm: Secondary | ICD-10-CM | POA: Diagnosis not present

## 2018-04-24 DIAGNOSIS — R45851 Suicidal ideations: Secondary | ICD-10-CM | POA: Diagnosis present

## 2018-04-24 DIAGNOSIS — E119 Type 2 diabetes mellitus without complications: Secondary | ICD-10-CM | POA: Diagnosis present

## 2018-04-24 DIAGNOSIS — F1490 Cocaine use, unspecified, uncomplicated: Secondary | ICD-10-CM | POA: Diagnosis not present

## 2018-04-24 DIAGNOSIS — F332 Major depressive disorder, recurrent severe without psychotic features: Principal | ICD-10-CM

## 2018-04-24 DIAGNOSIS — F322 Major depressive disorder, single episode, severe without psychotic features: Secondary | ICD-10-CM | POA: Diagnosis not present

## 2018-04-24 LAB — COMPREHENSIVE METABOLIC PANEL
ALBUMIN: 3.8 g/dL (ref 3.5–5.0)
ALT: 8 U/L (ref 0–44)
AST: 15 U/L (ref 15–41)
Alkaline Phosphatase: 92 U/L (ref 38–126)
Anion gap: 15 (ref 5–15)
BUN: 13 mg/dL (ref 6–20)
CHLORIDE: 103 mmol/L (ref 98–111)
CO2: 20 mmol/L — ABNORMAL LOW (ref 22–32)
Calcium: 9.5 mg/dL (ref 8.9–10.3)
Creatinine, Ser: 0.65 mg/dL (ref 0.44–1.00)
GFR calc Af Amer: >60 mL/min (ref >60)
GFR calc non Af Amer: >60 mL/min (ref >60)
GLUCOSE: 278 mg/dL — AB (ref 70–99)
POTASSIUM: 3.9 mmol/L (ref 3.5–5.1)
Sodium: 138 mmol/L (ref 135–145)
Total Bilirubin: 0.8 mg/dL (ref 0.3–1.2)
Total Protein: 7.6 g/dL (ref 6.5–8.1)

## 2018-04-24 LAB — I-STAT BETA HCG BLOOD, ED (MC, WL, AP ONLY)

## 2018-04-24 LAB — CBC WITH DIFFERENTIAL/PLATELET
BASOS ABS: 0.1 10*3/uL (ref 0.0–0.1)
Basophils Relative: 1 %
EOS ABS: 0.2 10*3/uL (ref 0.0–0.7)
EOS PCT: 2 %
HCT: 39.6 % (ref 36.0–46.0)
Hemoglobin: 13.8 g/dL (ref 12.0–15.0)
Lymphocytes Relative: 32 %
Lymphs Abs: 3.2 10*3/uL (ref 0.7–4.0)
MCH: 26.8 pg (ref 26.0–34.0)
MCHC: 34.8 g/dL (ref 30.0–36.0)
MCV: 77 fL — ABNORMAL LOW (ref 78.0–100.0)
Monocytes Absolute: 0.6 10*3/uL (ref 0.1–1.0)
Monocytes Relative: 6 %
Neutro Abs: 5.9 10*3/uL (ref 1.7–7.7)
Neutrophils Relative %: 59 %
PLATELETS: 184 10*3/uL (ref 150–400)
RBC: 5.14 MIL/uL — AB (ref 3.87–5.11)
RDW: 14 % (ref 11.5–15.5)
WBC: 9.9 10*3/uL (ref 4.0–10.5)

## 2018-04-24 LAB — ACETAMINOPHEN LEVEL: Acetaminophen (Tylenol), Serum: <10 ug/mL — ABNORMAL LOW (ref 10–30)

## 2018-04-24 LAB — RAPID URINE DRUG SCREEN, HOSP PERFORMED
AMPHETAMINES: NOT DETECTED
BARBITURATES: NOT DETECTED
BENZODIAZEPINES: NOT DETECTED
Cocaine: POSITIVE — AB
TETRAHYDROCANNABINOL: NOT DETECTED

## 2018-04-24 LAB — ETHANOL: Alcohol, Ethyl (B): 64 mg/dL — ABNORMAL HIGH (ref <10)

## 2018-04-24 LAB — SALICYLATE LEVEL: Salicylate Lvl: <7 mg/dL (ref 2.8–30.0)

## 2018-04-24 MED ORDER — ACETAMINOPHEN 325 MG PO TABS
650.0000 mg | ORAL_TABLET | ORAL | Status: DC | PRN
  Administered 2018-04-24: 650 mg via ORAL
  Filled 2018-04-24: qty 2

## 2018-04-24 MED ORDER — SERTRALINE HCL 50 MG PO TABS: 25.0000 mg | ORAL_TABLET | Freq: Every day | ORAL | Status: DC

## 2018-04-24 MED ORDER — ONDANSETRON HCL 4 MG/2ML IJ SOLN
4.0000 mg | Freq: Once | INTRAMUSCULAR | Status: AC
  Administered 2018-04-24: 4 mg via INTRAVENOUS
  Filled 2018-04-24: qty 2

## 2018-04-24 MED ORDER — SODIUM CHLORIDE 0.9 % IV BOLUS
1000.0000 mL | Freq: Once | INTRAVENOUS | Status: AC
  Administered 2018-04-24: 1000 mL via INTRAVENOUS

## 2018-04-24 NOTE — ED Notes (Signed)
Family at bedside. 

## 2018-04-24 NOTE — ED Notes (Signed)
Bed: WA13 Expected date:  Expected time:  Means of arrival:  Comments: HOLD FOR RES B 

## 2018-04-24 NOTE — ED Notes (Signed)
SBAR Report received from previous nurse. Pt received calm and visible on unit. Pt denies current SI/ HI, A/V H, depression, anxiety, or pain at this time, and appears otherwise stable and free of distress. Pt reminded of camera surveillance, q 15 min rounds, and rules of the milieu. Will continue to assess. 

## 2018-04-24 NOTE — ED Notes (Signed)
Patient complaining of 8/10 chest pain with pressure.  EKG recorded and Dr. Rhunette CroftNanavati made aware.

## 2018-04-24 NOTE — ED Triage Notes (Signed)
Per EMS- patient reported to boyfriend that she took 3 handfuls of pills that included Lisinopril and Flexeril. Unknown amount because prescriptions were filled earlier this year. Patient lethargic, but verbally responsive. Patient states she has been under a lot of stress. Patient attempted to vomit prior to arrival, but unable to do so. EKG-ST.

## 2018-04-24 NOTE — ED Notes (Signed)
Pt has been made aware that we need a UA sample.

## 2018-04-24 NOTE — ED Notes (Signed)
[employer] in to see the patient and reported to the writer that he counted 49 Lisinopril tabs left out of 90 in the bottle. EDP notified.

## 2018-04-24 NOTE — ED Notes (Signed)
Patient changed into purple scrubs, personal clothes placed in personal belongings bag.

## 2018-04-24 NOTE — ED Notes (Signed)
[facility]Poison Control closed the case.

## 2018-04-24 NOTE — BH Assessment (Signed)
Assessment Note  Stacey Lucas is an 42 y.o. female that presents this date after attempting self harm by overdose. Patient states she took a unknown amount of medications to include Flexeril and Lisinopril. Patient reports she was "trying to take her life" due to "everything" with current stressors to include: ongoing custody problems (legal issues) with ex-husband, financial problems and upcoming criminal charges 05/27/18 (Domestic Violence). Patient denies any current SA use or past attempts/gestures at self harm. Patient denies any H/I or AVH. Patient is drowsy and speaks in a low soft voice and is observed to be tearful at times during the assessment. Patient denies any previous inpatient/outpatient admissions/care although reports she was diagnosed with depression over 5 years ago by her PCP. Patient states she has "struggled with depression for years" reporting her depression has worsened over the last month with symptoms to include: excessive sadness, fatigue, decreased sleep (4 hours or less a night for the past month), isolating and guilt. Patient denies any current/past medication interventions to assist with stabilization. Patient reports she is currently employed at [employer] and resides with her partner of one year. Patient is observed to be very drowsy and renders limited history as the assessment is being conducted. Patient is oriented x 4 and does not appear to be responding to any internal stimuli. Patient is displaying some active thought blocking and is observed to be impaired. Patient is requesting a voluntary admission stating she "really needs help." Per notes, patient brought in by EMS after partner reported  she took 3 handfuls of pills that included Lisinopril and Flexeril. Unknown amount because prescriptions were filled earlier this year. Case was staffed with Arville CareParks FNP who recommended a inpatient admission to assist with stabilization as appropriate bed placement is investigated.    Diagnosis: F33.2 MDD recurrent without psychotic features, severe   Past Medical History:  Past Medical History:  Diagnosis Date  . Diabetes mellitus without complication ([facility])    Type II    Past Surgical History:  Procedure Laterality Date  . tubal      Family History:  Family History  Problem Relation Age of Onset  . Diabetes Mother   . Hypertension Mother   . Arthritis Mother   . Diabetes Father   . Hypertension Father     Social History:  reports that she has been smoking cigarettes. She has been smoking about 1.00 pack per day. She has never used smokeless tobacco. She reports that she drinks alcohol. She reports that she does not use drugs.  Additional Social History:  Alcohol / Drug Use Pain Medications: See MAR Prescriptions: See MAR Over the Counter: See MAR History of alcohol / drug use?: No history of alcohol / drug abuse Longest period of sobriety (when/how long): NA Negative Consequences of Use: (Denies) Withdrawal Symptoms: (Denies)  CIWA: CIWA-Ar BP: 122/72 Pulse Rate: 100 COWS:    Allergies: No Known Allergies  Home Medications:  (Not in a hospital admission)  OB/GYN Status:  Patient's last menstrual period was 04/08/2018 (exact date).  General Assessment Data Assessment unable to be completed: Yes Reason for not completing assessment: Pt is sleeping overdosed earlier this date [facility]: [facility] TTS Assessment: In system Is this a Tele or Face-to-Face Assessment?: Face-to-Face Is this an Initial Assessment or a Re-assessment for this encounter?: Initial Assessment Marital status: Long term relationship Stacey Lucas name: Stacey Lucas Is patient pregnant?: No Pregnancy Status: No Living Arrangements: Spouse/significant other Can pt return to current living arrangement?: Yes Admission Status: Voluntary  Is patient capable of signing voluntary admission?: Yes Referral Source: Self/Family/Friend Insurance type: BC/BS  Medical Screening  Exam [facility] Walk-in ONLY) Medical Exam completed: Yes  Crisis Care Plan Living Arrangements: Spouse/significant other Legal Guardian: (NA) Name of Psychiatrist: None Name of Therapist: None  Education Status Is patient currently in school?: No Is the patient employed, unemployed or receiving disability?: Employed  Risk to self with the past 6 months Suicidal Ideation: Yes-Currently Present Has patient been a risk to self within the past 6 months prior to admission? : No Suicidal Intent: Yes-Currently Present Has patient had any suicidal intent within the past 6 months prior to admission? : No Is patient at risk for suicide?: Yes Suicidal Plan?: Yes-Currently Present Has patient had any suicidal plan within the past 6 months prior to admission? : No Specify Current Suicidal Plan: Overdose Access to Means: Yes Specify Access to Suicidal Means: Pt had medications  What has been your use of drugs/alcohol within the last 12 months?: Denies current use Previous Attempts/Gestures: No How many times?: 0 Other Self Harm Risks: NA Triggers for Past Attempts: Unknown Intentional Self Injurious Behavior: None Family Suicide History: No Recent stressful life event(s): Conflict (Comment)(Family, ex-husband) Persecutory voices/beliefs?: No Depression: Yes Depression Symptoms: Isolating, Fatigue, Guilt, Feeling worthless/self pity Substance abuse history and/or treatment for substance abuse?: No Suicide prevention information given to non-admitted patients: Not applicable  Risk to Others within the past 6 months Homicidal Ideation: No Does patient have any lifetime risk of violence toward others beyond the six months prior to admission? : No Thoughts of Harm to Others: No Current Homicidal Intent: No Current Homicidal Plan: No Access to Homicidal Means: No Identified Victim: NA History of harm to others?: No Assessment of Violence: None Noted Violent Behavior Description: NA Does  patient have access to weapons?: No Criminal Charges Pending?: No Describe Pending Criminal Charges: DV Does patient have a court date: No Court Date: 05/27/18 Is patient on probation?: No  Psychosis Hallucinations: None noted Delusions: None noted  Mental Status Report Appearance/Hygiene: In scrubs Eye Contact: Fair Motor Activity: Freedom of movement Speech: Soft, Slow Level of Consciousness: Drowsy Mood: Depressed Affect: Sad Anxiety Level: Minimal Thought Processes: Thought Blocking Judgement: Impaired Orientation: Person, Place, Time Obsessive Compulsive Thoughts/Behaviors: None  Cognitive Functioning Concentration: Decreased Memory: Recent Intact, Remote Intact Is patient IDD: No Is patient DD?: No Insight: Fair Impulse Control: Poor Appetite: Fair Have you had any weight changes? : No Change Sleep: Decreased Total Hours of Sleep: 4 Vegetative Symptoms: None  ADLScreening [facility] Assessment Services) Patient's cognitive ability adequate to safely complete daily activities?: Yes Patient able to express need for assistance with ADLs?: Yes Independently performs ADLs?: Yes (appropriate for developmental age)  Prior Inpatient Therapy Prior Inpatient Therapy: No  Prior Outpatient Therapy Prior Outpatient Therapy: No Does patient have an ACCT team?: No Does patient have Intensive In-House Services?  : No Does patient have [facility] services? : No Does patient have P4CC services?: No  ADL Screening (condition at time of admission) Patient's cognitive ability adequate to safely complete daily activities?: Yes Is the patient deaf or have difficulty hearing?: No Does the patient have difficulty seeing, even when wearing glasses/contacts?: No Does the patient have difficulty concentrating, remembering, or making decisions?: No Patient able to express need for assistance with ADLs?: Yes Does the patient have difficulty dressing or bathing?: No Independently performs  ADLs?: Yes (appropriate for developmental age) Does the patient have difficulty walking or climbing stairs?: No Weakness of Legs:  None Weakness of Arms/Hands: None  Home Assistive Devices/Equipment Home Assistive Devices/Equipment: None  Therapy Consults (therapy consults require a physician order) PT Evaluation Needed: No OT Evalulation Needed: No SLP Evaluation Needed: No Abuse/Neglect Assessment (Assessment to be complete while patient is alone) Physical Abuse: Denies Verbal Abuse: Denies Sexual Abuse: Denies Exploitation of patient/patient's resources: Denies Self-Neglect: Denies Values / Beliefs Cultural Requests During Hospitalization: None Spiritual Requests During Hospitalization: None Consults Spiritual Care Consult Needed: No Social Work Consult Needed: No Merchant navy officerAdvance Directives (For Healthcare) Does Patient Have a Medical Advance Directive?: No Would patient like information on creating a medical advance directive?: No - Patient declined    Additional Information 1:1 In Past 12 Months?: No CIRT Risk: No Elopement Risk: No Does patient have medical clearance?: Yes     Disposition: Case was staffed with Arville CareParks FNP who recommended a inpatient admission to assist with stabilization as appropriate bed placement is investigated.   Disposition Initial Assessment Completed for this Encounter: Yes Disposition of Patient: Admit Type of inpatient treatment program: Adult Patient refused recommended treatment: Yes Mode of transportation if patient is discharged?: Loreli Slot(UNK)  On Site Evaluation by:   Reviewed with Physician:    Alfredia Fergusonavid L Ruhi Kopke 04/24/2018 5:07 PM

## 2018-04-24 NOTE — ED Notes (Signed)
Poison Control notified and provided recommendations for tx: - Optimize K+ and Mag to high normal - Avoid meds that affect QT - Repeat ECG 4 hrs - IV fluids - Norepi and dopamine for hypotension Recommendations given to [facility]

## 2018-04-24 NOTE — ED Notes (Signed)
[facility] Expected date:  Expected time:  Means of arrival:  Comments: Christell ConstantMoore

## 2018-04-24 NOTE — ED Notes (Signed)
Patient tearful while Clinical research associatewriter in the room. Patient repeated several times,"I'm tired. I wanted to die." Patient states she told her boyfriend that she took Lisinopril and flexeril (3 handfuls).

## 2018-04-24 NOTE — ED Provider Notes (Addendum)
[facility] DEPT Provider Note   CSN: [id number] Arrival date & time: 04/24/18  1032     History   Chief Complaint Chief Complaint  Patient presents with  . Drug Overdose    HPI Stacey Lucas is a 42 y.o. female.  HPI  42 year old patient with history of diabetes comes in with chief complaint of overdose.  Patient reports that she is " tired" and wants to " just kill herself".  About 2 hours ago patient took unknown amount of lisinopril and Flexeril.  Patient states that she took about 3 face full of medications.   Patient denies any chest pain, shortness of breath, dizziness, abdominal pain.  She has nausea.  Past Medical History:  Diagnosis Date  . Diabetes mellitus without complication ([place])    Type II    There are no active problems to display for this patient.   Past Surgical History:  Procedure Laterality Date  . tubal       OB History   None      Home Medications    Prior to Admission medications   Medication Sig Start Date End Date Taking? Authorizing Provider  Aspirin-Caffeine (BAYER BACK & BODY) 500-32.5 MG TABS Take 2 tablets by mouth 2 (two) times daily.   Yes [provider]  atorvastatin (LIPITOR) 20 MG tablet Take 1 tablet (20 mg total) by mouth daily. 11/27/17  Yes Jaynee Eagles, PA-C  blood glucose meter kit and supplies KIT Check fasting blood sugar in the morning. 11/27/17  Yes Jaynee Eagles, PA-C  cyclobenzaprine (FLEXERIL) 10 MG tablet Take 0.5-1 tablets (5-10 mg total) by mouth 2 (two) times daily as needed for muscle spasms. 04/15/18  Yes Harris, Abigail, PA-C  Insulin Glargine (BASAGLAR KWIKPEN) 100 UNIT/ML SOPN Inject 0.2 mLs (20 Units total) into the skin at bedtime. 12/09/17  Yes Jaynee Eagles, PA-C  Insulin Pen Needle (PEN NEEDLES) 32G X 4 MM MISC 1 each by Does not apply route at bedtime. 01/30/18  Yes Jaynee Eagles, PA-C  lisinopril (PRINIVIL,ZESTRIL) 5 MG tablet Take 1 tablet (5 mg total) by mouth daily.  11/27/17  Yes Jaynee Eagles, PA-C  meloxicam (MOBIC) 15 MG tablet Take 1 tablet (15 mg total) by mouth daily. 04/15/18  Yes Harris, Abigail, PA-C  sitaGLIPtin (JANUVIA) 100 MG tablet Take 1 tablet (100 mg total) by mouth daily. 11/28/17  Yes Jaynee Eagles, PA-C  benzonatate (TESSALON) 100 MG capsule Take 1-2 capsules (100-200 mg total) by mouth 3 (three) times daily as needed for cough. Patient not taking: Reported on 04/24/2018 11/24/17   Jaynee Eagles, PA-C  fluconazole (DIFLUCAN) 150 MG tablet Take 1 tablet (150 mg total) by mouth once a week. Patient not taking: Reported on 04/24/2018 11/28/17   Jaynee Eagles, PA-C  HYDROcodone-homatropine [facility]) 5-1.5 MG/5ML syrup Take 5 mLs by mouth at bedtime as needed. Patient not taking: Reported on 04/24/2018 11/24/17   Jaynee Eagles, PA-C  metFORMIN (GLUCOPHAGE) 1000 MG tablet Take 1 tablet (1,000 mg total) by mouth 2 (two) times daily with a meal. Patient not taking: Reported on 04/24/2018 11/28/17   Jaynee Eagles, PA-C  ondansetron (ZOFRAN-ODT) 8 MG disintegrating tablet Take 1 tablet (8 mg total) by mouth every 8 (eight) hours as needed for nausea. Patient not taking: Reported on 04/24/2018 11/24/17   Jaynee Eagles, PA-C  oseltamivir (TAMIFLU) 75 MG capsule Take 1 capsule (75 mg total) by mouth 2 (two) times daily. Patient not taking: Reported on 04/24/2018 11/24/17   Jaynee Eagles, PA-C  Family History Family History  Problem Relation Age of Onset  . Diabetes Mother   . Hypertension Mother   . Arthritis Mother   . Diabetes Father   . Hypertension Father     Social History Social History   Tobacco Use  . Smoking status: Current Every Day Smoker    Packs/day: 1.00    Types: Cigarettes  . Smokeless tobacco: Never Used  Substance Use Topics  . Alcohol use: Yes    Comment: occasionally  . Drug use: No     Allergies   Patient has no known allergies.   Review of Systems Review of Systems  Constitutional: Positive for activity change.  Respiratory:  Negative for shortness of breath.   Cardiovascular: Negative for chest pain.  Gastrointestinal: Positive for nausea.  Psychiatric/Behavioral: Positive for self-injury and suicidal ideas.  All other systems reviewed and are negative.    Physical Exam Updated Vital Signs BP 125/83   Pulse (!) 101   Temp 98 F (36.7 C)   Resp 16   Ht '5\' 5"'  (1.651 m)   Wt 74.8 kg   LMP 04/08/2018 (Exact Date)   SpO2 99%   BMI 27.46 kg/m   Physical Exam  Constitutional: She is oriented to person, place, and time. She appears well-developed.  HENT:  Head: Normocephalic and atraumatic.  Eyes: Pupils are equal, round, and reactive to light. EOM are normal.  No nystagmus  Neck: Normal range of motion. Neck supple.  Cardiovascular: Normal rate.  Pulmonary/Chest: Effort normal.  Abdominal: Bowel sounds are normal.  Musculoskeletal: She exhibits no edema.  Neurological: She is alert and oriented to person, place, and time. No cranial nerve deficit.  Skin: Skin is warm and dry.  Nursing note and vitals reviewed.    ED Treatments / Results  Labs (all labs ordered are listed, but only abnormal results are displayed) Labs Reviewed  COMPREHENSIVE METABOLIC PANEL - Abnormal; Notable for the following components:      Result Value   CO2 20 (*)    Glucose, Bld 278 (*)    All other components within normal limits  ETHANOL - Abnormal; Notable for the following components:   Alcohol, Ethyl (B) 64 (*)    All other components within normal limits  CBC WITH DIFFERENTIAL/PLATELET - Abnormal; Notable for the following components:   RBC 5.14 (*)    MCV 77.0 (*)    All other components within normal limits  ACETAMINOPHEN LEVEL - Abnormal; Notable for the following components:   Acetaminophen (Tylenol), Serum <10 (*)    All other components within normal limits  SALICYLATE LEVEL  RAPID URINE DRUG SCREEN, HOSP PERFORMED  I-STAT BETA HCG BLOOD, ED ([facility], [facility], [facility] ONLY)  I-STAT BETA HCG BLOOD, ED ([facility], [facility], [facility]  ONLY)    EKG EKG Interpretation  Date/Time:  Friday April 24 2018 10:40:27 EDT Ventricular Rate:  112 PR Interval:    QRS Duration: 89 QT Interval:  364 QTC Calculation: 497 R Axis:   -50 Text Interpretation:  Sinus tachycardia Left anterior fascicular block Abnormal R-wave progression, late transition Borderline prolonged QT interval No acute changes No significant change since last tracing Confirmed by Varney Biles [id number]) on 04/24/2018 10:58:52 AM   EKG Interpretation  Date/Time:  Friday April 24 2018 12:18:28 EDT Ventricular Rate:  99 PR Interval:    QRS Duration: 90 QT Interval:  384 QTC Calculation: 493 R Axis:   -48 Text Interpretation:  Sinus rhythm Consider left atrial enlargement LAD, consider left anterior  fascicular block Abnormal R-wave progression, late transition Borderline prolonged QT interval No significant change since last tracing Confirmed by Varney Biles [id number]) on 04/24/2018 2:31:02 PM       EKG Interpretation  Date/Time:  Friday April 24 2018 15:27:37 EDT Ventricular Rate:  96 PR Interval:    QRS Duration: 96 QT Interval:  382 QTC Calculation: 483 R Axis:   -36 Text Interpretation:  Sinus rhythm Consider left atrial enlargement Left axis deviation improved QT Confirmed by Varney Biles [phone]) on 04/24/2018 4:12:14 PM          Radiology No results found.  Procedures Procedures (including critical care time)  CRITICAL CARE Performed by: Annai Heick   Total critical care time: 36 minutes for suicidal ideation with overdose.  Patient required multiple reassessments and has prolonged QT.  Critical care time was exclusive of separately billable procedures and treating other patients.  Critical care was necessary to treat or prevent imminent or life-threatening deterioration.  Critical care was time spent personally by me on the following activities: development of treatment plan with patient and/or surrogate as well as  nursing, discussions with consultants, evaluation of patient's response to treatment, examination of patient, obtaining history from patient or surrogate, ordering and performing treatments and interventions, ordering and review of laboratory studies, ordering and review of radiographic studies, pulse oximetry and re-evaluation of patient's condition.   Medications Ordered in ED Medications  acetaminophen (TYLENOL) tablet 650 mg (has no administration in time range)  sodium chloride 0.9 % bolus 1,000 mL (0 mLs Intravenous Stopped 04/24/18 1226)  ondansetron (ZOFRAN) injection 4 mg (4 mg Intravenous Given 04/24/18 1117)     Initial Impression / Assessment and Plan / ED Course  I have reviewed the triage vital signs and the nursing notes.  Pertinent labs & imaging results that were available during my care of the patient were reviewed by me and considered in my medical decision making (see chart for details).  Clinical Course as of Apr 25 1431  Fri Apr 24, 2018  1432 Results from the ER workup discussed with the patient face to face and all questions answered to the best of my ability.    [AN]    Clinical Course User Index [AN] Varney Biles, MD    42 year old female comes in with chief complaint of overdose.  Patient tried to commit suicide by overdosing on lisinopril and Flexeril.  She informs [place] that she took 3 fistful of medications.   It seems like patient was discharged with 90 into 5 mg lisinopril.  Patient reports that she never took her lisinopril, and [employer] noticed that there were 49 pills in the bottle -therefore I suspect that patient took about 40 x 5 mg pills of lisinopril.   Additionally, she was discharged with 20 x 10 mg Flexerils.  Currently patient is hemodynamically stable.  She is tachycardic and has slightly prolonged QT.  Poison control has been called.  They recommend that patient be monitored closely for 4 hours, and they will repeat EKG in 4 hours.  Patient  will require pressors if she becomes hypotensive.  We will continue to monitor closely.  2:32 PM Patient is medically cleared at this time for psychiatric evaluation. Repeat EKG ordered. Patient's QTC is close to 500, therefore medications that can widen QT should be carefully considered before administering.  Final Clinical Impressions(s) / ED Diagnoses   Final diagnoses:  Intentional drug overdose, initial encounter [facility])  Suicidal ideation    ED Discharge Orders  None       Varney Biles, MD 04/24/18 [place], Shaili Donalson, MD 04/24/18 1612    Varney Biles, MD 04/25/18 1600

## 2018-04-24 NOTE — Progress Notes (Signed)
[facility] will need a repeat EKG prior to being admitted for inpt treatment. [facility] to Dr. Jola Babinskilary, MD pending repeat EKG. [place], Bishop DublinJudith A, RN has been advised and states she will contact [facility].  Stacey Lucas, MSW, LCSW Therapeutic Triage Specialist  [phone]

## 2018-04-24 NOTE — ED Notes (Signed)
Bed: RESB Expected date:  Expected time:  Means of arrival:  Comments: Flexeril/lisinopril OD

## 2018-04-24 NOTE — BH Assessment (Signed)
[facility] Assessment Progress Note Case was staffed with Arville CareParks FNP who recommended a inpatient admission to assist with stabilization as appropriate bed placement is investigated.

## 2018-04-24 NOTE — ED Notes (Signed)
[facility]Poison Control called and suggested a repeat EKG .

## 2018-04-24 NOTE — ED Notes (Signed)
Called to receiving nurse to inform that [facility]. TCU nurse to Archivistinform writer when she is able to take the patient.

## 2018-04-25 ENCOUNTER — Other Ambulatory Visit: Payer: Self-pay

## 2018-04-25 ENCOUNTER — Encounter (HOSPITAL_COMMUNITY): Payer: Self-pay | Admitting: *Deleted

## 2018-04-25 DIAGNOSIS — F1490 Cocaine use, unspecified, uncomplicated: Secondary | ICD-10-CM

## 2018-04-25 DIAGNOSIS — Z794 Long term (current) use of insulin: Secondary | ICD-10-CM

## 2018-04-25 DIAGNOSIS — F322 Major depressive disorder, single episode, severe without psychotic features: Secondary | ICD-10-CM | POA: Diagnosis present

## 2018-04-25 DIAGNOSIS — F17210 Nicotine dependence, cigarettes, uncomplicated: Secondary | ICD-10-CM

## 2018-04-25 DIAGNOSIS — T464X2A Poisoning by angiotensin-converting-enzyme inhibitors, intentional self-harm, initial encounter: Secondary | ICD-10-CM

## 2018-04-25 DIAGNOSIS — E119 Type 2 diabetes mellitus without complications: Secondary | ICD-10-CM

## 2018-04-25 DIAGNOSIS — T481X2A Poisoning by skeletal muscle relaxants [neuromuscular blocking agents], intentional self-harm, initial encounter: Secondary | ICD-10-CM

## 2018-04-25 DIAGNOSIS — I10 Essential (primary) hypertension: Secondary | ICD-10-CM

## 2018-04-25 LAB — COMPREHENSIVE METABOLIC PANEL
ALBUMIN: 3.5 g/dL (ref 3.5–5.0)
ALT: 9 U/L (ref 0–44)
AST: 24 U/L (ref 15–41)
Alkaline Phosphatase: 86 U/L (ref 38–126)
Anion gap: 13 (ref 5–15)
BILIRUBIN TOTAL: 0.5 mg/dL (ref 0.3–1.2)
BUN: 18 mg/dL (ref 6–20)
CALCIUM: 9.5 mg/dL (ref 8.9–10.3)
CO2: 23 mmol/L (ref 22–32)
Chloride: 103 mmol/L (ref 98–111)
Creatinine, Ser: 1.03 mg/dL — ABNORMAL HIGH (ref 0.44–1.00)
Glucose, Bld: 286 mg/dL — ABNORMAL HIGH (ref 70–99)
Potassium: 4.1 mmol/L (ref 3.5–5.1)
Sodium: 139 mmol/L (ref 135–145)
TOTAL PROTEIN: 7 g/dL (ref 6.5–8.1)

## 2018-04-25 LAB — GLUCOSE, CAPILLARY
GLUCOSE-CAPILLARY: 355 mg/dL — AB (ref 70–99)
Glucose-Capillary: 273 mg/dL — ABNORMAL HIGH (ref 70–99)
Glucose-Capillary: 396 mg/dL — ABNORMAL HIGH (ref 70–99)
Glucose-Capillary: 172 mg/dL — ABNORMAL HIGH (ref 70–99)

## 2018-04-25 MED ORDER — LISINOPRIL 5 MG PO TABS
5.0000 mg | ORAL_TABLET | Freq: Every day | ORAL | Status: DC
  Administered 2018-04-26 – 2018-04-27 (×2): 5 mg via ORAL
  Filled 2018-04-25 (×5): qty 1

## 2018-04-25 MED ORDER — HYDROXYZINE HCL 25 MG PO TABS: 25.0000 mg | ORAL_TABLET | Freq: Four times a day (QID) | ORAL | Status: DC | PRN

## 2018-04-25 MED ORDER — INSULIN ASPART 100 UNIT/ML ~~LOC~~ SOLN
0.0000 [IU] | Freq: Three times a day (TID) | SUBCUTANEOUS | Status: DC
  Administered 2018-04-25 (×2): 15 [IU] via SUBCUTANEOUS
  Administered 2018-04-25: 8 [IU] via SUBCUTANEOUS
  Administered 2018-04-26: 15 [IU] via SUBCUTANEOUS
  Administered 2018-04-26: 8 [IU] via SUBCUTANEOUS
  Administered 2018-04-26: 11 [IU] via SUBCUTANEOUS
  Administered 2018-04-27: 5 [IU] via SUBCUTANEOUS

## 2018-04-25 MED ORDER — MAGNESIUM HYDROXIDE 400 MG/5ML PO SUSP: 30.0000 mL | Freq: Every day | ORAL | Status: DC | PRN

## 2018-04-25 MED ORDER — MIRTAZAPINE 15 MG PO TBDP
15.0000 mg | ORAL_TABLET | Freq: Every day | ORAL | Status: DC
  Filled 2018-04-25 (×2): qty 1

## 2018-04-25 MED ORDER — LINAGLIPTIN 5 MG PO TABS
5.0000 mg | ORAL_TABLET | Freq: Every day | ORAL | Status: DC
  Administered 2018-04-25 – 2018-04-27 (×3): 5 mg via ORAL
  Filled 2018-04-25 (×5): qty 1

## 2018-04-25 MED ORDER — INSULIN ASPART 100 UNIT/ML ~~LOC~~ SOLN
0.0000 [IU] | Freq: Every day | SUBCUTANEOUS | Status: DC
  Administered 2018-04-26: 4 [IU] via SUBCUTANEOUS

## 2018-04-25 MED ORDER — ALUM & MAG HYDROXIDE-SIMETH 200-200-20 MG/5ML PO SUSP: 30.0000 mL | ORAL | Status: DC | PRN

## 2018-04-25 MED ORDER — ATORVASTATIN CALCIUM 20 MG PO TABS
20.0000 mg | ORAL_TABLET | Freq: Every day | ORAL | Status: DC
  Administered 2018-04-25 – 2018-04-27 (×3): 20 mg via ORAL
  Filled 2018-04-25 (×4): qty 1
  Filled 2018-04-25: qty 2

## 2018-04-25 MED ORDER — CITALOPRAM HYDROBROMIDE 10 MG PO TABS
10.0000 mg | ORAL_TABLET | Freq: Every day | ORAL | Status: DC
  Administered 2018-04-25 – 2018-04-26 (×2): 10 mg via ORAL
  Filled 2018-04-25 (×5): qty 1

## 2018-04-25 MED ORDER — NICOTINE 21 MG/24HR TD PT24
21.0000 mg | MEDICATED_PATCH | Freq: Every day | TRANSDERMAL | Status: DC
  Administered 2018-04-25 – 2018-04-27 (×3): 21 mg via TRANSDERMAL
  Filled 2018-04-25 (×5): qty 1

## 2018-04-25 NOTE — [facility] Group Notes (Signed)
LCSW Group Therapy Note  04/25/2018    10:30-11:30am   Type of Therapy and Topic:  Group Therapy: Anger and Coping Skills  Participation Level:  Active   Description of Group:   In this group, patients learned how to recognize the physical, cognitive, emotional, and behavioral responses they have to anger-provoking situations.  They identified how they usually or often react when angered, and learned how healthy and unhealthy coping skills work initially, but the unhealthy ones stop working.   They analyzed how their frequently-chosen coping skill is possibly beneficial and how it is possibly unhelpful.  The group discussed a variety of healthier coping skills that could help in resolving the actual issues, as well as how to go about planning for the the possibility of future similar situations.  Therapeutic Goals: 1. Patients will identify one thing that makes them angry and how they feel emotionally and physically, what their thoughts are or tend to be in those situations, and what healthy or unhealthy coping mechanism they typically use 2. Patients will identify how their coping technique works for them, as well as how it works against them. 3. Patients will explore possible new behaviors to use in future anger situations. 4. Patients will learn that anger itself is normal and cannot be eliminated, and that healthier coping skills can assist with resolving conflict rather than worsening situations.  Summary of Patient Progress:  The patient shared that she was "furious" several days ago ago when talking to a [employer] worker about her daughter, and she reacted by drinking which led to becoming suicidal and needing hospitalization.  She believes this was a reaction to feeling guilty, a lack of control, and a sense of betrayal and she fully expressed an understanding that she had chosen an unhealthy method of reacting.  Therapeutic Modalities:   Cognitive Behavioral  Therapy Motivation Interviewing  Lynnell ChadMareida J Grossman-Orr  .

## 2018-04-25 NOTE — Tx Team (Signed)
Initial Treatment Plan 04/25/2018 12:44 AM Stacey Lucas [id number]    PATIENT STRESSORS: Marital or family conflict   PATIENT STRENGTHS: Average or above average intelligence  Communication skills   PATIENT IDENTIFIED PROBLEMS: Depression  Suicidal ideation        "decrease anxiety, depression"           DISCHARGE CRITERIA:  Improved stabilization in mood, thinking, and/or behavior  Commitment to continuing care  PRELIMINARY DISCHARGE PLAN: Return to previous living arrangement  PATIENT/FAMILY INVOLVEMENT: This treatment plan has been presented to and reviewed with the patient, Stacey Beacherian Hinchliffe.  The patient and family have been given the opportunity to ask questions and make suggestions.  Juliann ParesBowman, Lyvia Mondesir Elizabeth, RN 04/25/2018, 12:44 AM
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
## 2018-04-25 NOTE — [facility] Group Notes (Signed)
[facility] Group Notes:  (Nursing)  Date:  04/25/2018  Time:  1:30 PM Type of Therapy:  Nurse Education  Participation Level:  Active  Participation Quality:  Appropriate and Attentive  Affect:  Appropriate  Cognitive:  Appropriate  Insight:  Appropriate  Engagement in Group:  Engaged  Modes of Intervention:  Discussion and Education  Summary of Progress/Problems: Patient engaged in nurse-led educational group - discussed 'Identifying Needs'  Shela NevinValerie S Rosiland Sen 04/25/2018, 4:30 PM

## 2018-04-25 NOTE — [facility] Group Notes (Signed)
Adult Psychoeducational Group Note  Date:  04/25/2018 Time:  9:57 PM  Group Topic/Focus:  Wrap-Up Group:   The focus of this group is to help patients review their daily goal of treatment and discuss progress on daily workbooks.  Participation Level:  Active  Participation Quality:  Appropriate and Attentive  Affect:  Appropriate  Cognitive:  Alert and Appropriate  Insight: Appropriate and Good  Engagement in Group:  Engaged  Modes of Intervention:  Discussion and Education  Additional Comments:  Pt attended and participated in wrap up group this evening. Pt had a good day due to them still "being here" (alive). Pt goal is to continue to control their anxiety and depression.   Stacey NettersOctavia A Jeda Lucas 04/25/2018, 9:57 PM
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
## 2018-04-25 NOTE — Progress Notes (Signed)
D Pt is observed OOB UAL o the 400 hall today. She tolerates this fairly well. She endorses a flat, dperessed affect. She does make good eye cotact. SHe shares ehr peronal feelings with this Clinical research associatewriter. She is attending her groups as planned.      A She completed a daily assessment and on it she wrote she deneid SI today  And she rated her depression, hopelessness and anxeity " 1/0/0/", respectively. She told Clinical research associatewriter ( at dinnertime) that she had previously fallen when she was in the gym today and she said " now my tooth is loose". She denied hitting her head. Writer spoke with MHT who were with her in the gym and nobody witnessed this . Writer forward this info to NP (TM) as wwellla s AC (BM) who is currently watching vido tape .      R Safety is in place.
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
## 2018-04-25 NOTE — Plan of Care (Signed)
  Problem: Education: Goal: Knowledge of [facility] Education information/materials will improve Outcome: Progressing   
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
## 2018-04-26 LAB — GLUCOSE, CAPILLARY
GLUCOSE-CAPILLARY: 324 mg/dL — AB (ref 70–99)
GLUCOSE-CAPILLARY: 318 mg/dL — AB (ref 70–99)
Glucose-Capillary: 394 mg/dL — ABNORMAL HIGH (ref 70–99)
Glucose-Capillary: 283 mg/dL — ABNORMAL HIGH (ref 70–99)

## 2018-04-26 MED ORDER — INSULIN GLARGINE 100 UNIT/ML ~~LOC~~ SOLN
15.0000 [IU] | Freq: Every day | SUBCUTANEOUS | Status: DC
  Administered 2018-04-26: 15 [IU] via SUBCUTANEOUS

## 2018-04-26 MED ORDER — IBUPROFEN 600 MG PO TABS
600.0000 mg | ORAL_TABLET | Freq: Four times a day (QID) | ORAL | Status: DC | PRN
  Administered 2018-04-26 – 2018-04-27 (×3): 600 mg via ORAL
  Filled 2018-04-26 (×3): qty 1

## 2018-04-26 MED ORDER — INSULIN GLARGINE 100 UNIT/ML ~~LOC~~ SOLN
10.0000 [IU] | Freq: Once | SUBCUTANEOUS | Status: AC
  Administered 2018-04-26: 10 [IU] via SUBCUTANEOUS

## 2018-04-26 MED ORDER — CITALOPRAM HYDROBROMIDE 20 MG PO TABS
20.0000 mg | ORAL_TABLET | Freq: Every day | ORAL | Status: DC
  Administered 2018-04-27: 20 mg via ORAL
  Filled 2018-04-26 (×2): qty 1

## 2018-04-26 NOTE — [facility] Group Notes (Signed)
[facility] LCSW Group Therapy Note  04/26/2018  10:00-11:00AM  Type of Therapy and Topic:  Group Therapy:  Being Your Own Support  Participation Level:  Active   Description of Group:  Patients in this group were introduced to the concept that self-support is an essential part of recovery.  A song entitled "My Own Hero" was played and a group discussion ensued in which patients stated they could relate to the song and it inspired them to realize they have be willing to help themselves in order to succeed, because other people cannot achieve sobriety or stability for them.  We discussed adding a variety of healthy supports to address the various needs in their lives.  A song was played called "I Know Where I've Been" toward the end of group and used to conduct an inspirational wrap-up to group of remembering how far they have already come in their journey.  Therapeutic Goals: 1)  demonstrate the importance of being a part of one's own support system 2)  discuss reasons people in one's life may eventually be unable to be continually supportive  3)  identify the patient's current support system and   4)  elicit commitments to add healthy supports and to become more conscious of being self-supportive   Summary of Patient Progress:  The patient expressed that her mother father, boss, family and partner Clifton Custardaron are healthy supports while she herself is an unhealthy support.  She said she needs to become a better self-support and needs to be with groups of people who understand what she is going through.  She was able to gently and with humor confront another patient about being a poor self-support as well.   Therapeutic Modalities:   Motivational Interviewing Activity  Lynnell ChadMareida J Grossman-Orr

## 2018-04-26 NOTE — Progress Notes (Signed)
Nursing Progress Note: 7-7p  D- Mood is depressed and anxious,rates anxiety at 5/10. Affect is blunted and appropriate. Pt is able to contract for safety. Continues to have difficulty staying asleep. Goal for today is getting my anxiety and depression under control. Pt reports her high blood sugars have increase her anxiety.  A - Observed pt interacting in group and in the milieu.Support and encouragement offered, safety maintained with q 15 minutes.   R-Contracts for safety and continues to follow treatment plan, working on learning new coping skills. Pt educated about healthy diet and healthy blood sugar . Pt given motrin for Lt knee pain

## 2018-04-26 NOTE — Plan of Care (Signed)
D: Pt denies SI/HI/AVH. Pt is pleasant and cooperative. Pt visible on unit in dayroom with peers this evening. Pt stated she was anxious/ depressed but felt a little better this evening.  A: Pt was offered support and encouragement. Pt was given scheduled medications. Pt was encourage to attend groups. Q 15 minute checks were done for safety.  R:Pt attends groups and interacts well with peers and staff. Pt is taking medication. Pt has no complaints.Pt receptive to treatment and safety maintained on unit.   Problem: Education: Goal: Emotional status will improve Outcome: Progressing   Problem: Education: Goal: Mental status will improve Outcome: Progressing   Problem: Activity: Goal: Sleeping patterns will improve Outcome: Progressing

## 2018-04-26 NOTE — Progress Notes (Signed)
[employer] MD Progress Note  04/26/2018 9:48 AM Stacey Lucas  MRN:  [id number] Subjective: Patient is seen and examined.  Patient is a 42 year old female with a past psychiatric history significant for alcohol use disorder, cocaine use disorder, hypertension and diabetes who was admitted after an intentional overdose of Flexeril and lisinopril.  She states she is feeling better this morning.  She states she was visited by her fianc and her mother last night.  She also spoke to her father on the telephone and she said that went well.  She denied any gross suicidal ideation is morning.  She has spoken to her boss at work, and everything is fine with that.  She stated her depression has improved to a degree, but admits it is probably because she is in the safe environment in the hospital.  Her blood sugars have been still out of control.  Her blood sugar this morning is almost 400.  We discussed whether or not she had Lantus insulin in the past.  She admitted that she had.  No side effects to the citalopram so far.  We discussed that. Principal Problem: <principal problem not specified> Diagnosis:   Patient Active Problem List   Diagnosis Date Noted  . MDD (major depressive disorder), severe ([place]) [F32.2] 04/25/2018   Total Time spent with patient: 15 minutes  Past Psychiatric History: See admission H&P  Past Medical History:  Past Medical History:  Diagnosis Date  . Diabetes mellitus without complication ([place])    Type II    Past Surgical History:  Procedure Laterality Date  . tubal     Family History:  Family History  Problem Relation Age of Onset  . Diabetes Mother   . Hypertension Mother   . Arthritis Mother   . Diabetes Father   . Hypertension Father    Family Psychiatric  History: See admission H&P Social History:  Social History   Substance and Sexual Activity  Alcohol Use Yes   Comment: occasionally     Social History   Substance and Sexual Activity  Drug Use No    Social  History   Socioeconomic History  . Marital status: Single    Spouse name: Not on file  . Number of children: Not on file  . Years of education: Not on file  . Highest education level: Not on file  Occupational History  . Not on file  Social Needs  . Financial resource strain: Not on file  . Food insecurity:    Worry: Not on file    Inability: Not on file  . Transportation needs:    Medical: Not on file    Non-medical: Not on file  Tobacco Use  . Smoking status: Current Every Day Smoker    Packs/day: 1.00    Types: Cigarettes  . Smokeless tobacco: Never Used  Substance and Sexual Activity  . Alcohol use: Yes    Comment: occasionally  . Drug use: No  . Sexual activity: Yes    Birth control/protection: None  Lifestyle  . Physical activity:    Days per week: Not on file    Minutes per session: Not on file  . Stress: Not on file  Relationships  . Social connections:    Talks on phone: Not on file    Gets together: Not on file    Attends religious service: Not on file    Active member of club or organization: Not on file    Attends meetings of clubs or organizations: Not  on file    Relationship status: Not on file  Other Topics Concern  . Not on file  Social History Narrative  . Not on file   Additional Social History:                         Sleep: Good  Appetite:  Fair  Current Medications: Current Facility-Administered Medications  Medication Dose Route Frequency Provider Last Rate Last Dose  . alum & mag hydroxide-simeth (MAALOX/MYLANTA) 200-200-20 MG/5ML suspension 30 mL  30 mL Oral Q4H PRN Patriciaann Clan E, PA-C      . atorvastatin (LIPITOR) tablet 20 mg  20 mg Oral Daily Patriciaann Clan E, PA-C   20 mg at 04/26/18 0913  . citalopram (CELEXA) tablet 10 mg  10 mg Oral Daily Sharma Covert, MD   10 mg at 04/26/18 0913  . hydrOXYzine (ATARAX/VISTARIL) tablet 25 mg  25 mg Oral Q6H PRN Patriciaann Clan E, PA-C      . insulin aspart (novoLOG)  injection 0-15 Units  0-15 Units Subcutaneous TID WC Laverle Hobby, PA-C   15 Units at 04/26/18 [id number]  . insulin aspart (novoLOG) injection 0-5 Units  0-5 Units Subcutaneous QHS Simon, Spencer E, PA-C      . insulin glargine (LANTUS) injection 10 Units  10 Units Subcutaneous Once Sharma Covert, MD      . insulin glargine (LANTUS) injection 15 Units  15 Units Subcutaneous QHS Sharma Covert, MD      . linagliptin (TRADJENTA) tablet 5 mg  5 mg Oral Daily Patriciaann Clan E, PA-C   5 mg at 04/26/18 0912  . lisinopril (PRINIVIL,ZESTRIL) tablet 5 mg  5 mg Oral Daily Laverle Hobby, PA-C   5 mg at 04/26/18 0913  . magnesium hydroxide (MILK OF MAGNESIA) suspension 30 mL  30 mL Oral Daily PRN Patriciaann Clan E, PA-C      . nicotine (NICODERM CQ - dosed in mg/24 hours) patch 21 mg  21 mg Transdermal Daily Cobos, Myer Peer, MD   21 mg at 04/26/18 9169    Lab Results:  Results for orders placed or performed during the hospital encounter of 04/24/18 (from the past 48 hour(s))  Glucose, capillary     Status: Abnormal   Collection Time: 04/25/18  6:08 AM  Result Value Ref Range   Glucose-Capillary 355 (H) 70 - 99 mg/dL   Comment 1 Notify RN    Comment 2 Document in Chart   Glucose, capillary     Status: Abnormal   Collection Time: 04/25/18 11:47 AM  Result Value Ref Range   Glucose-Capillary 273 (H) 70 - 99 mg/dL   Comment 1 Notify RN   Glucose, capillary     Status: Abnormal   Collection Time: 04/25/18  4:55 PM  Result Value Ref Range   Glucose-Capillary 396 (H) 70 - 99 mg/dL   Comment 1 Notify RN   Comprehensive metabolic panel     Status: Abnormal   Collection Time: 04/25/18  7:00 PM  Result Value Ref Range   Sodium 139 135 - 145 mmol/L   Potassium 4.1 3.5 - 5.1 mmol/L   Chloride 103 98 - 111 mmol/L   CO2 23 22 - 32 mmol/L   Glucose, Bld 286 (H) 70 - 99 mg/dL   BUN 18 6 - 20 mg/dL   Creatinine, Ser 1.03 (H) 0.44 - 1.00 mg/dL   Calcium 9.5 8.9 - 10.3 mg/dL   Total Protein 7.0  6.5 - 8.1 g/dL   Albumin 3.5 3.5 - 5.0 g/dL   AST 24 15 - 41 U/L   ALT 9 0 - 44 U/L   Alkaline Phosphatase 86 38 - 126 U/L   Total Bilirubin 0.5 0.3 - 1.2 mg/dL   GFR calc non Af Amer >60 >60 mL/min   GFR calc Af Amer >60 >60 mL/min    Comment: (NOTE) The eGFR has been calculated using the CKD EPI equation. This calculation has not been validated in all clinical situations. eGFR's persistently <60 mL/min signify possible Chronic Kidney Disease.    Anion gap 13 5 - 15    Comment: Performed at [facility], [place] [address] [place], [place] [address]  Glucose, capillary     Status: Abnormal   Collection Time: 04/25/18  8:53 PM  Result Value Ref Range   Glucose-Capillary 172 (H) 70 - 99 mg/dL   Comment 1 Notify RN    Comment 2 Document in Chart   Glucose, capillary     Status: Abnormal   Collection Time: 04/26/18  6:02 AM  Result Value Ref Range   Glucose-Capillary 394 (H) 70 - 99 mg/dL   Comment 1 Notify RN    Comment 2 Document in Chart     Blood Alcohol level:  Lab Results  Component Value Date   ETH 64 (H) 57/26/2035    Metabolic Disorder Labs: Lab Results  Component Value Date   HGBA1C 14.0 11/28/2017   No results found for: PROLACTIN No results found for: CHOL, TRIG, HDL, CHOLHDL, VLDL, LDLCALC  Physical Findings: AIMS: Facial and Oral Movements Muscles of Facial Expression: None, normal Lips and Perioral Area: None, normal Jaw: None, normal Tongue: None, normal,Extremity Movements Upper (arms, wrists, hands, fingers): None, normal Lower (legs, knees, ankles, toes): None, normal, Trunk Movements Neck, shoulders, hips: None, normal, Overall Severity Severity of abnormal movements (highest score from questions above): None, normal Incapacitation due to abnormal movements: None, normal Patient's awareness of abnormal movements (rate only patient's report): No Awareness, Dental Status Current problems with teeth and/or dentures?: No Does  patient usually wear dentures?: No  CIWA:    COWS:     Musculoskeletal: Strength & Muscle Tone: within normal limits Gait & Station: normal Patient leans: N/A  Psychiatric Specialty Exam: Physical Exam  Nursing note and vitals reviewed. Constitutional: She is oriented to person, place, and time. She appears well-developed and well-nourished.  HENT:  Head: Normocephalic.  Respiratory: Effort normal.  Neurological: She is alert and oriented to person, place, and time.    ROS  Blood pressure 110/78, pulse 99, temperature 99.5 F (37.5 C), temperature source Oral, resp. rate 16, height '5\' 5"'  (1.651 m), weight 74.8 kg, last menstrual period 04/08/2018.Body mass index is 27.46 kg/m.  General Appearance: Casual  Eye Contact:  Good  Speech:  Normal Rate  Volume:  Normal  Mood:  Anxious  Affect:  Congruent  Thought Process:  Coherent and Descriptions of Associations: Intact  Orientation:  Full (Time, Place, and Person)  Thought Content:  Logical  Suicidal Thoughts:  No  Homicidal Thoughts:  No  Memory:  Immediate;   Fair Recent;   Fair Remote;   Fair  Judgement:  Intact  Insight:  Fair  Psychomotor Activity:  Normal  Concentration:  Concentration: Fair and Attention Span: Fair  Recall:  [employer] of Knowledge:  Fair  Language:  Fair  Akathisia:  Negative  Handed:  Right  AIMS (if indicated):  Assets:  Communication Skills Desire for Improvement Financial Resources/Insurance Housing Intimacy Physical Health Resilience Social Support  ADL's:  Intact  Cognition:  WNL  Sleep:  Number of Hours: 6.25     Treatment Plan Summary: Daily contact with patient to assess and evaluate symptoms and progress in treatment, Medication management and Plan : Patient is seen and examined.  Patient is a 42 year old female with the above-stated past psychiatric history seen in follow-up.  She is slowly improving.  I am going to increase her citalopram to 20 mg p.o. daily starting the  day.  Is not having any active withdrawal symptoms at this time.  Her blood sugars are very high, and I am going to give her 10 units Lantus insulin right now, and restart her on Lantus at bedtime and will just to 15 units to start.  She continues on the sliding scale as well.  No other changes in her medications.  Hopefully she will continue to improve.  Sharma Covert, MD 04/26/2018, 9:48 AM

## 2018-04-27 ENCOUNTER — Encounter (HOSPITAL_COMMUNITY): Payer: Self-pay | Admitting: Behavioral Health

## 2018-04-27 DIAGNOSIS — F332 Major depressive disorder, recurrent severe without psychotic features: Secondary | ICD-10-CM

## 2018-04-27 LAB — GLUCOSE, CAPILLARY: Glucose-Capillary: 240 mg/dL — ABNORMAL HIGH (ref 70–99)

## 2018-04-27 MED ORDER — METFORMIN HCL ER 500 MG PO TB24
500.0000 mg | ORAL_TABLET | Freq: Every day | ORAL | Status: DC
  Administered 2018-04-27: 500 mg via ORAL
  Filled 2018-04-27 (×2): qty 1

## 2018-04-27 MED ORDER — INSULIN GLARGINE 100 UNIT/ML ~~LOC~~ SOLN: 15.0000 [IU] | Freq: Every day | SUBCUTANEOUS | 0 refills | Status: DC

## 2018-04-27 MED ORDER — LINAGLIPTIN 5 MG PO TABS: 5.0000 mg | ORAL_TABLET | Freq: Every day | ORAL | 0 refills | Status: DC

## 2018-04-27 MED ORDER — HYDROXYZINE HCL 25 MG PO TABS: 25.0000 mg | ORAL_TABLET | Freq: Four times a day (QID) | ORAL | 0 refills | Status: DC | PRN

## 2018-04-27 MED ORDER — LISINOPRIL 5 MG PO TABS: 5.0000 mg | ORAL_TABLET | Freq: Every day | ORAL | 0 refills | Status: DC

## 2018-04-27 MED ORDER — METFORMIN HCL ER 500 MG PO TB24: 500.0000 mg | ORAL_TABLET | Freq: Every day | ORAL | 0 refills | Status: DC

## 2018-04-27 MED ORDER — CITALOPRAM HYDROBROMIDE 20 MG PO TABS: 20.0000 mg | ORAL_TABLET | Freq: Every day | ORAL | 0 refills | Status: DC

## 2018-04-27 NOTE — [facility] Suicide Risk Assessment (Signed)
[facility]   Principal Problem: <principal problem not specified> Discharge Diagnoses:  Patient Active Problem List   Diagnosis Date Noted  . MDD (major depressive disorder), severe (HCC) [F32.2] 04/25/2018    Total Time spent with patient: 15 minutes  Musculoskeletal: Strength & Muscle Tone: within normal limits Gait & Station: normal Patient leans: N/A  Psychiatric Specialty Exam: Review of Systems  All other systems reviewed and are negative.   Blood pressure (!) 107/59, pulse 88, temperature 99.1 F (37.3 C), temperature source Oral, resp. rate 20, height 5\' 5"  (1.651 m), weight 74.8 kg, last menstrual period 04/08/2018, SpO2 100 %.Body mass index is 27.46 kg/m.  General Appearance: Casual  Eye Contact::  Good  Speech:  Clear and Coherent409  Volume:  Normal  Mood:  Euthymic  Affect:  Appropriate  Thought Process:  Coherent and Descriptions of Associations: Intact  Orientation:  Full (Time, Place, and Person)  Thought Content:  Logical  Suicidal Thoughts:  No  Homicidal Thoughts:  No  Memory:  Immediate;   Good Recent;   Good Remote;   Good  Judgement:  Intact  Insight:  Good  Psychomotor Activity:  Normal  Concentration:  Good  Recall:  Good  Fund of Knowledge:Good  Language: Good  Akathisia:  Negative  Handed:  Right  AIMS (if indicated):     Assets:  Communication Skills Desire for Improvement Financial Resources/Insurance Housing Physical Health Resilience Talents/Skills  Sleep:  Number of Hours: 6  Cognition: WNL  ADL's:  Intact   Mental Status Per Nursing Assessment::   On Admission:  Suicidal ideation indicated by patient, Suicidal ideation indicated by others  Demographic Factors:  Divorced or widowed  Loss Factors: NA  Historical Factors: Impulsivity  Risk Reduction Factors:   Sense of responsibility to family, Employed, Living with another person, especially a relative, Positive social support and  Positive coping skills or problem solving skills  Continued Clinical Symptoms:  Depression:   Impulsivity  Cognitive Features That Contribute To Risk:  None    Suicide Risk:  Minimal: No identifiable suicidal ideation.  Patients presenting with no risk factors but with morbid ruminations; may be classified as minimal risk based on the severity of the depressive symptoms  Follow-up Information    [facility] Follow up.   Why:  Your next appointment with Dr. Wallis BambergMario Mani is.... Contact information: [address] [place] [phone]          Plan Of Care/Follow-up recommendations:  Activity:  ad lib  Antonieta PertGreg Lawson Clary, MD 04/27/2018, 8:16 AM

## 2018-04-27 NOTE — [facility] Suicide Risk Assessment (Signed)
B[facility] INPATIENT:  Family/Significant Other Suicide Prevention Education  Suicide Prevention Education:  Education Completed; Stacey Lucas, fiance, [phone], has been identified by the patient as the family member/significant other with whom the patient will be residing, and identified as the person(s) who will aid the patient in the event of a mental health crisis (suicidal ideations/suicide attempt).  With written consent from the patient, the family member/significant other has been provided the following suicide prevention education, prior to the and/or following the discharge of the patient.  The suicide prevention education provided includes the following:  Suicide risk factors  Suicide prevention and interventions  National Suicide Hotline telephone number  [facility] assessment telephone number  [facility] Emergency Assistance 911  [facility] and/or Residential Mobile Crisis Unit telephone number  Request made of family/significant other to:  Remove weapons (e.g., guns, rifles, knives), all items previously/currently identified as safety concern.  No guns in the home, per [place].  Remove drugs/medications (over-the-counter, prescriptions, illicit drugs), all items previously/currently identified as a safety concern.  The family member/significant other verbalizes understanding of the suicide prevention education information provided.  The family member/significant other agrees to remove the items of safety concern listed above.  Stacey Lucas said he has been by to visit pt and she seems to be doing better.  He will check in with her moving forward.  Stacey Lucas, Stacey Dembeck Jon, LCSW 04/27/2018, 10:15 AM

## 2018-04-27 NOTE — Progress Notes (Signed)
Adult Psychoeducational Group Note  Date:  04/27/2018 Time:  12:43 AM  Group Topic/Focus:  Wrap-Up Group:   The focus of this group is to help patients review their daily goal of treatment and discuss progress on daily workbooks.  Participation Level:  Active  Participation Quality:  Appropriate  Affect:  Appropriate  Cognitive:  Appropriate  Insight: Good  Engagement in Group:  Engaged  Modes of Intervention:  Discussion  Additional Comments:  Pt's goal was to work on her anxiety and depression.  Pt had a visit with her fiance' and that made her day better.  Pt rated the day at a 10/10.  Ka Bench 04/27/2018, 12:43 AM

## 2018-04-27 NOTE — Progress Notes (Signed)
  [facility] :  Will you be returning to the same living situation after discharge:  Yes,  fiancee At discharge, do you have transportation home?: Yes,  fiancee Do you have the ability to pay for your medications: Yes,  BCBS  Release of information consent forms completed and in the chart;  Patient's signature needed at discharge.  Patient to Follow up at: Follow-up Information    [facility] [place] Go in 3 day(s).   Specialty:  Professional Counselor Why:  Please attend a walk in appt Monday-Friday between 8:30am-2:30pm.  Please request therapy and medication management services. Contact information: [facility] [address] [place] [address] [phone]           Next level of care provider has access to [facility] Link:no  Safety Planning and Suicide Prevention discussed: Yes,  with fiancee  Have you used any form of tobacco in the last 30 days? (Cigarettes, Smokeless Tobacco, Cigars, and/or Pipes): Yes  Has patient been referred to the Quitline?: Patient refused referral  Patient has been referred for addiction treatment: Pt. refused referral  Lorri FrederickWierda, Andrius Andrepont Jon, LCSW 04/27/2018, 10:21 AM
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
## 2018-04-27 NOTE — Progress Notes (Signed)
Discharge note:  Patient discharged home per MD order.  She received all personal belongings from unit and locker.  Reviewed AVS/transition record with patient and she indicated understanding.  She denies any thoughts of self harm.  Patient left ambulatory with her fiance.

## 2018-04-27 NOTE — Progress Notes (Signed)
Recreation Therapy Notes  Date: 8.19.19 Time: 0930 Location: [address] Dayroom  Group Topic: Stress Management  Goal Area(s) Addresses:  Patient will verbalize importance of using healthy stress management.  Patient will identify positive emotions associated with healthy stress management.   Intervention: Stress Management  Activity :  Guided Imagery.  LRT introduced the stress management technique of guided imagery.  LRT read a script that allowed patients to visualize their peaceful place.  Patients were to follow along as script was read.  Education: Stress Management, Discharge Planning.   Education Outcome: Acknowledges edcuation/In group clarification offered/Needs additional education  Clinical Observations/Feedback: Pt did not attend group.     Caroll RancherMarjette Garik Diamant, LRT/CTRS         Caroll RancherLindsay, Diago Haik A 04/27/2018 12:47 PM

## 2018-04-27 NOTE — Tx Team (Signed)
Interdisciplinary Treatment and Diagnostic Plan Update  04/27/2018 Time of Session: 0919 Stacey Lucas MRN: [id number]  Principal Diagnosis: MDD (major depressive disorder), recurrent severe, without psychosis ([place])  Secondary Diagnoses: Principal Problem:   MDD (major depressive disorder), recurrent severe, without psychosis ([place]) Active Problems:   MDD (major depressive disorder), severe ([place])   Current Medications:  No current facility-administered medications for this encounter.    Current Outpatient Medications  Medication Sig Dispense Refill  . blood glucose meter kit and supplies KIT Check fasting blood sugar in the morning. 1 each 0  . [START ON 04/28/2018] citalopram (CELEXA) 20 MG tablet Take 1 tablet (20 mg total) by mouth daily. 30 tablet 0  . hydrOXYzine (ATARAX/VISTARIL) 25 MG tablet Take 1 tablet (25 mg total) by mouth every 6 (six) hours as needed for anxiety. 30 tablet 0  . insulin glargine (LANTUS) 100 UNIT/ML injection Inject 0.15 mLs (15 Units total) into the skin at bedtime. 10 mL 0  . Insulin Pen Needle (PEN NEEDLES) 32G X 4 MM MISC 1 each by Does not apply route at bedtime. 100 each 3  . [START ON 04/28/2018] linagliptin (TRADJENTA) 5 MG TABS tablet Take 1 tablet (5 mg total) by mouth daily. 30 tablet 0  . lisinopril (PRINIVIL,ZESTRIL) 5 MG tablet Take 1 tablet (5 mg total) by mouth daily. 30 tablet 0  . metFORMIN (GLUCOPHAGE-XR) 500 MG 24 hr tablet Take 1 tablet (500 mg total) by mouth daily with breakfast. 30 tablet 0   PTA Medications: No medications prior to admission.    Patient Stressors: Marital or family conflict  Patient Strengths: Average or above average intelligence  Treatment Modalities: Medication Management, Group therapy, Case management,  1 to 1 session with clinician, Psychoeducation, Recreational therapy.   Physician Treatment Plan for Primary Diagnosis: MDD (major depressive disorder), recurrent severe, without psychosis ([place]) Long Term  Goal(s): Improvement in symptoms so as ready for discharge Improvement in symptoms so as ready for discharge   Short Term Goals: Ability to identify changes in lifestyle to reduce recurrence of condition will improve Ability to verbalize feelings will improve Ability to disclose and discuss suicidal ideas Ability to demonstrate self-control will improve Ability to identify and develop effective coping behaviors will improve Ability to maintain clinical measurements within normal limits will improve Ability to identify triggers associated with substance abuse/mental health issues will improve Ability to identify changes in lifestyle to reduce recurrence of condition will improve Ability to verbalize feelings will improve Ability to disclose and discuss suicidal ideas Ability to demonstrate self-control will improve Ability to identify and develop effective coping behaviors will improve Ability to maintain clinical measurements within normal limits will improve Ability to identify triggers associated with substance abuse/mental health issues will improve  Medication Management: Evaluate patient's response, side effects, and tolerance of medication regimen.  Therapeutic Interventions: 1 to 1 sessions, Unit Group sessions and Medication administration.  Evaluation of Outcomes: Adequate for Discharge  Physician Treatment Plan for Secondary Diagnosis: Principal Problem:   MDD (major depressive disorder), recurrent severe, without psychosis ([place]) Active Problems:   MDD (major depressive disorder), severe ([place])  Long Term Goal(s): Improvement in symptoms so as ready for discharge Improvement in symptoms so as ready for discharge   Short Term Goals: Ability to identify changes in lifestyle to reduce recurrence of condition will improve Ability to verbalize feelings will improve Ability to disclose and discuss suicidal ideas Ability to demonstrate self-control will improve Ability to  identify and develop effective coping behaviors will  improve Ability to maintain clinical measurements within normal limits will improve Ability to identify triggers associated with substance abuse/mental health issues will improve Ability to identify changes in lifestyle to reduce recurrence of condition will improve Ability to verbalize feelings will improve Ability to disclose and discuss suicidal ideas Ability to demonstrate self-control will improve Ability to identify and develop effective coping behaviors will improve Ability to maintain clinical measurements within normal limits will improve Ability to identify triggers associated with substance abuse/mental health issues will improve     Medication Management: Evaluate patient's response, side effects, and tolerance of medication regimen.  Therapeutic Interventions: 1 to 1 sessions, Unit Group sessions and Medication administration.  Evaluation of Outcomes: Adequate for Discharge   RN Treatment Plan for Primary Diagnosis: MDD (major depressive disorder), recurrent severe, without psychosis ([place]) Long Term Goal(s): Knowledge of disease and therapeutic regimen to maintain health will improve  Short Term Goals: Ability to identify and develop effective coping behaviors will improve and Compliance with prescribed medications will improve  Medication Management: RN will administer medications as ordered by provider, will assess and evaluate patient's response and provide education to patient for prescribed medication. RN will report any adverse and/or side effects to prescribing provider.  Therapeutic Interventions: 1 on 1 counseling sessions, Psychoeducation, Medication administration, Evaluate responses to treatment, Monitor vital signs and CBGs as ordered, Perform/monitor CIWA, COWS, AIMS and Fall Risk screenings as ordered, Perform wound care treatments as ordered.  Evaluation of Outcomes: Adequate for Discharge   LCSW Treatment  Plan for Primary Diagnosis: MDD (major depressive disorder), recurrent severe, without psychosis ([place]) Long Term Goal(s): Safe transition to appropriate next level of care at discharge, Engage patient in therapeutic group addressing interpersonal concerns.  Short Term Goals: Engage patient in aftercare planning with referrals and resources, Increase social support and Increase skills for wellness and recovery  Therapeutic Interventions: Assess for all discharge needs, 1 to 1 time with Social worker, Explore available resources and support systems, Assess for adequacy in community support network, Educate family and significant other(s) on suicide prevention, Complete Psychosocial Assessment, Interpersonal group therapy.  Evaluation of Outcomes: Adequate for Discharge   Progress in Treatment: Attending groups: Yes. Participating in groups: Yes. Taking medication as prescribed: Yes. Toleration medication: Yes. Family/Significant other contact made: Yes, individual(s) contacted:  fiancee Patient understands diagnosis: Yes. Discussing patient identified problems/goals with staff: Yes. Medical problems stabilized or resolved: Yes. Denies suicidal/homicidal ideation: Yes. Issues/concerns per patient self-inventory: No. Other: none  New problem(s) identified: No, Describe:  none  New Short Term/Long Term Goal(s):  Patient Goals:  Get anxiety and depression under control.  Discharge Plan or Barriers: Will follow up at [facility].  Reason for Continuation of Hospitalization: None, discharge today.  Estimated Length of Stay:discharge today.  Attendees: Patient: Stacey Lucas 04/27/2018   Physician: Dr. Mallie Darting, MD 04/27/2018   Nursing: Archie Balboa, RN 04/27/2018   RN Care Manager: 04/27/2018   Social Worker: Lurline Idol, LCSW 04/27/2018   Recreational Therapist:  04/27/2018   Other:  04/27/2018   Other:  04/27/2018   Other: 04/27/2018     Scribe for Treatment  Team: Joanne Chars, [place] 04/27/2018 3:20 PM

## 2018-05-22 ENCOUNTER — Ambulatory Visit: Payer: BC Managed Care – PPO | Admitting: Urgent Care

## 2018-06-18 ENCOUNTER — Encounter (HOSPITAL_COMMUNITY): Payer: Self-pay

## 2018-06-18 ENCOUNTER — Emergency Department (HOSPITAL_COMMUNITY): Payer: BC Managed Care – PPO

## 2018-06-18 ENCOUNTER — Other Ambulatory Visit: Payer: Self-pay

## 2018-06-18 ENCOUNTER — Emergency Department (HOSPITAL_COMMUNITY)
Admission: EM | Admit: 2018-06-18 | Discharge: 2018-06-18 | Disposition: A | Discharge status: Home / Self Care | Payer: BC Managed Care – PPO | Attending: Emergency Medicine | Admitting: Emergency Medicine

## 2018-06-18 DIAGNOSIS — J020 Streptococcal pharyngitis: Secondary | ICD-10-CM | POA: Diagnosis not present

## 2018-06-18 DIAGNOSIS — F17210 Nicotine dependence, cigarettes, uncomplicated: Secondary | ICD-10-CM | POA: Diagnosis not present

## 2018-06-18 DIAGNOSIS — Z79899 Other long term (current) drug therapy: Secondary | ICD-10-CM | POA: Diagnosis not present

## 2018-06-18 DIAGNOSIS — E119 Type 2 diabetes mellitus without complications: Secondary | ICD-10-CM | POA: Diagnosis not present

## 2018-06-18 DIAGNOSIS — Z794 Long term (current) use of insulin: Secondary | ICD-10-CM | POA: Diagnosis not present

## 2018-06-18 DIAGNOSIS — J029 Acute pharyngitis, unspecified: Secondary | ICD-10-CM | POA: Diagnosis present

## 2018-06-18 LAB — GROUP A STREP BY PCR: Group A Strep by PCR: DETECTED — AB

## 2018-06-18 MED ORDER — DEXAMETHASONE SODIUM PHOSPHATE 10 MG/ML IJ SOLN
10.0000 mg | Freq: Once | INTRAMUSCULAR | Status: AC
  Administered 2018-06-18: 10 mg via INTRAMUSCULAR
  Filled 2018-06-18: qty 1

## 2018-06-18 MED ORDER — PENICILLIN G BENZATHINE 1200000 UNIT/2ML IM SUSP
1.2000 10*6.[IU] | Freq: Once | INTRAMUSCULAR | Status: AC
  Administered 2018-06-18: 1.2 10*6.[IU] via INTRAMUSCULAR
  Filled 2018-06-18: qty 2

## 2018-06-18 NOTE — Discharge Instructions (Addendum)
You were seen in the emergency department and diagnosed with strep throat.  You were given a shot of Penicillin and a shot of Decadron.  - Penicillin is an antibiotic used to treat the infection - Decadron is a steroid used to treat the pain and swelling of your throat.   You should gradually feel better over the next few days. Take Tylenol and Ibuprofen for fever and pain. Follow up with your primary care provider in the next 1 week if you are not feeling better, if you do not have a primary care provider one is provided in your discharge instructions. Return to the emergency department for any new or worsening symptoms including but not limited to inability to open your mouth, inability to move your neck, worsening pain, change in your voice, inability to swallow your own saliva, drooling, or any other concerns.   

## 2018-06-18 NOTE — ED Notes (Signed)
Pt verbalized understanding of discharge instructions and denies any further questions at this time.   

## 2018-06-18 NOTE — ED Provider Notes (Signed)
Patient placed in Quick Look pathway, seen and evaluated   Chief Complaint: Sore throat  HPI:   Patient presents with a 2-week history of cough, nasal congestion, sore throat.  Patient reports it hurts a lot to swallow feels like she is a ball in her throat.  She has had a productive cough.  She has had reported fevers at home, however there is subjective.  She has been taking over-the-counter medications without relief.  ROS: Positive for cough, sore throat, congestion  Physical Exam:   Gen: No distress  Neuro: Awake and Alert  Skin: Warm    Focused Exam: Tonsils are erythematous with exudate, 3+: Decreased lung sounds, heart normal rate and rhythm, bilateral anterior neck tenderness without palpable adenopathy   Initiation of care has begun. The patient has been counseled on the process, plan, and necessity for staying for the completion/evaluation, and the remainder of the medical screening examination    Emi Holes, PA-C 06/18/18 1417    Charlynne Pander, MD 06/19/18 [phone]

## 2018-06-18 NOTE — ED Triage Notes (Signed)
Pt states that she thinks she has strep throat.

## 2018-06-18 NOTE — ED Notes (Signed)
Patient transported to X-ray 

## 2018-06-18 NOTE — ED Provider Notes (Addendum)
[facility] EMERGENCY DEPARTMENT Provider Note   CSN: [id number] Arrival date & time: 06/18/18  1407     History   Chief Complaint Chief Complaint  Patient presents with  . Sore Throat    HPI Stacey Lucas is a 42 y.o. female with a hx of tobacco abuse and diabetes mellitus who presents to the ED with complaints of URI sxs x 2 weeks. Patient reports congestion, rhinorrhea, sore throat, productive cough with mucous sputum, and subjective fevers. Progressive worsening of sxs. Pain in throat worse with swallowing, but able to swallow. Tried dayquil without relief. No other alleviating/aggravating factors. Feels similar to prior strep. Denies rash, tic exposures, ear pain, dyspnea, drooling, voice change, or chest pain. Denies chance of pregnancy.   HPI  Past Medical History:  Diagnosis Date  . Diabetes mellitus without complication ([place])    Type II    Patient Active Problem List   Diagnosis Date Noted  . MDD (major depressive disorder), recurrent severe, without psychosis ([place]) 04/27/2018  . MDD (major depressive disorder), severe ([place]) 04/25/2018    Past Surgical History:  Procedure Laterality Date  . tubal       OB History   None      Home Medications    Prior to Admission medications   Medication Sig Start Date End Date Taking? Authorizing Provider  blood glucose meter kit and supplies KIT Check fasting blood sugar in the morning. 11/27/17   Jaynee Eagles, PA-C  citalopram (CELEXA) 20 MG tablet Take 1 tablet (20 mg total) by mouth daily. 04/28/18   Mordecai Maes, NP  hydrOXYzine (ATARAX/VISTARIL) 25 MG tablet Take 1 tablet (25 mg total) by mouth every 6 (six) hours as needed for anxiety. 04/27/18   Mordecai Maes, NP  insulin glargine (LANTUS) 100 UNIT/ML injection Inject 0.15 mLs (15 Units total) into the skin at bedtime. 04/27/18   Mordecai Maes, NP  Insulin Pen Needle (PEN NEEDLES) 32G X 4 MM MISC 1 each by Does not apply route at bedtime.  01/30/18   Jaynee Eagles, PA-C  linagliptin (TRADJENTA) 5 MG TABS tablet Take 1 tablet (5 mg total) by mouth daily. 04/28/18   Mordecai Maes, NP  lisinopril (PRINIVIL,ZESTRIL) 5 MG tablet Take 1 tablet (5 mg total) by mouth daily. 04/27/18   Mordecai Maes, NP  metFORMIN (GLUCOPHAGE-XR) 500 MG 24 hr tablet Take 1 tablet (500 mg total) by mouth daily with breakfast. 04/27/18   Mordecai Maes, NP    Family History Family History  Problem Relation Age of Onset  . Diabetes Mother   . Hypertension Mother   . Arthritis Mother   . Diabetes Father   . Hypertension Father     Social History Social History   Tobacco Use  . Smoking status: Current Every Day Smoker    Packs/day: 1.00    Types: Cigarettes  . Smokeless tobacco: Never Used  Substance Use Topics  . Alcohol use: Yes    Comment: occasionally  . Drug use: No     Allergies   Patient has no known allergies.   Review of Systems Review of Systems  Constitutional: Positive for fever (subjective).  HENT: Positive for congestion, rhinorrhea, sore throat and trouble swallowing (painful, but able). Negative for drooling, ear pain and voice change.   Respiratory: Positive for cough. Negative for shortness of breath.   Cardiovascular: Negative for chest pain.  Gastrointestinal: Negative for vomiting.  Skin: Negative for rash.     Physical Exam Updated Vital Signs BP Marland Kitchen)  153/90 (BP Location: Right Arm)   Pulse 98   Temp 98.9 F (37.2 C) (Oral)   Resp 18   Ht '5\' 5"'  (1.651 m)   Wt 72.6 kg   LMP 06/18/2018   SpO2 100%   BMI 26.63 kg/m   Physical Exam  Constitutional: She appears well-developed and well-nourished.  Non-toxic appearance. She does not have a sickly appearance. No distress.  HENT:  Head: Normocephalic and atraumatic.  Right Ear: Tympanic membrane is not perforated, not erythematous, not retracted and not bulging.  Left Ear: Tympanic membrane is not perforated, not erythematous, not retracted and not  bulging.  Nose: Mucosal edema present.  Mouth/Throat: Uvula is midline. Oropharyngeal exudate and posterior oropharyngeal erythema present. Tonsils are 2+ on the right. Tonsils are 2+ on the left.  Posterior oropharynx is symmetric appearing. Patient tolerating own secretions without difficulty. No trismus. No drooling. No hot potato voice. No swelling beneath the tongue, submandibular compartment is soft.   Eyes: Pupils are equal, round, and reactive to light. Conjunctivae are normal. Right eye exhibits no discharge. Left eye exhibits no discharge.  Neck: Normal range of motion. Neck supple. No neck rigidity. No edema and no erythema present.  Cardiovascular: Normal rate and regular rhythm.  No murmur heard. Pulmonary/Chest: No stridor. No respiratory distress. She has no wheezes. She has no rhonchi. She has no rales.  Abdominal: Soft. She exhibits no distension. There is no tenderness.  Lymphadenopathy:    She has cervical adenopathy (mild anterior with tenderness bilaterally).  Neurological: She is alert.  Skin: Skin is warm and dry. No rash noted.  Psychiatric: She has a normal mood and affect. Her behavior is normal.  Nursing note and vitals reviewed.    ED Treatments / Results  Labs (all labs ordered are listed, but only abnormal results are displayed) Labs Reviewed  GROUP A STREP BY PCR - Abnormal; Notable for the following components:      Result Value   Group A Strep by PCR DETECTED (*)    All other components within normal limits    EKG None  Radiology Dg Chest 2 View  Result Date: 06/18/2018 CLINICAL DATA:  Cough and sore throat; fever EXAM: CHEST - 2 VIEW COMPARISON:  December 29, 2016 FINDINGS: The lungs are clear. The heart size and pulmonary vascularity are normal. No adenopathy. No bone lesions. IMPRESSION: No edema or consolidation. Electronically Signed   By: Lowella Grip III M.D.   On: 06/18/2018 16:16    Procedures Procedures (including critical care  time)  Medications Ordered in ED Medications  penicillin g benzathine (BICILLIN LA) 1200000 UNIT/2ML injection 1.2 Million Units (has no administration in time range)  dexamethasone (DECADRON) injection 10 mg (has no administration in time range)     Initial Impression / Assessment and Plan / ED Course  I have reviewed the triage vital signs and the nursing notes.  Pertinent labs & imaging results that were available during my care of the patient were reviewed by me and considered in my medical decision making (see chart for details).   Patient presents to the emergency department with URI type symptoms.  Patient nontoxic-appearing, no apparent distress, vitals WNL with the exception of elevated blood pressure, doubt HTN emergency, patient aware of need for recheck.  Lungs CTA, chest x-ray per triage team negative for infiltrate, doubt pneumonia.  No evidence of AOM/EOM on exam.  No meningeal signs.  No rashes or tick exposures to raise concern for tickborne illness.  Strep  test is positive, exam non concerning for PTA or RPA at this time, there is no trismus, uvular deviation, or hot potato voice.. Treated in the emergency department with decadron and IM Penicillin. Recommended use of Tylenol and Ibuprofen for any continued discomfort or fevers. I discussed results, treatment plan, need for PCP follow-up, and return precautions with the patient. Provided opportunity for questions, patient confirmed understanding and is in agreement with plan.    Final Clinical Impressions(s) / ED Diagnoses   Final diagnoses:  Strep pharyngitis    ED Discharge Orders    None       Amaryllis Dyke, PA-C 06/18/18 1633    Amaryllis Dyke, PA-C 06/18/18 1638    Charlesetta Shanks, MD 06/20/18 [phone]

## 2018-07-09 ENCOUNTER — Other Ambulatory Visit: Payer: Self-pay

## 2018-07-09 ENCOUNTER — Encounter (HOSPITAL_COMMUNITY): Payer: Self-pay

## 2018-07-09 ENCOUNTER — Emergency Department (HOSPITAL_COMMUNITY)
Admission: EM | Admit: 2018-07-09 | Discharge: 2018-07-09 | Disposition: A | Discharge status: Home / Self Care | Payer: BC Managed Care – PPO | Attending: Emergency Medicine | Admitting: Emergency Medicine

## 2018-07-09 ENCOUNTER — Emergency Department (HOSPITAL_COMMUNITY): Payer: BC Managed Care – PPO

## 2018-07-09 DIAGNOSIS — Y999 Unspecified external cause status: Secondary | ICD-10-CM | POA: Insufficient documentation

## 2018-07-09 DIAGNOSIS — F17210 Nicotine dependence, cigarettes, uncomplicated: Secondary | ICD-10-CM | POA: Insufficient documentation

## 2018-07-09 DIAGNOSIS — R200 Anesthesia of skin: Secondary | ICD-10-CM | POA: Insufficient documentation

## 2018-07-09 DIAGNOSIS — W182XXA Fall in (into) shower or empty bathtub, initial encounter: Secondary | ICD-10-CM | POA: Insufficient documentation

## 2018-07-09 DIAGNOSIS — Y93E1 Activity, personal bathing and showering: Secondary | ICD-10-CM | POA: Diagnosis not present

## 2018-07-09 DIAGNOSIS — Z79899 Other long term (current) drug therapy: Secondary | ICD-10-CM | POA: Insufficient documentation

## 2018-07-09 DIAGNOSIS — E119 Type 2 diabetes mellitus without complications: Secondary | ICD-10-CM | POA: Insufficient documentation

## 2018-07-09 DIAGNOSIS — M545 Low back pain, unspecified: Secondary | ICD-10-CM

## 2018-07-09 DIAGNOSIS — Z794 Long term (current) use of insulin: Secondary | ICD-10-CM | POA: Diagnosis not present

## 2018-07-09 DIAGNOSIS — Y92002 Bathroom of unspecified non-institutional (private) residence single-family (private) house as the place of occurrence of the external cause: Secondary | ICD-10-CM | POA: Diagnosis not present

## 2018-07-09 MED ORDER — KETOROLAC TROMETHAMINE 60 MG/2ML IM SOLN
30.0000 mg | Freq: Once | INTRAMUSCULAR | Status: AC
  Administered 2018-07-09: 30 mg via INTRAMUSCULAR
  Filled 2018-07-09: qty 2

## 2018-07-09 MED ORDER — CYCLOBENZAPRINE HCL 10 MG PO TABS: 10.0000 mg | ORAL_TABLET | Freq: Two times a day (BID) | ORAL | 0 refills | Status: DC | PRN

## 2018-07-09 MED ORDER — CYCLOBENZAPRINE HCL 10 MG PO TABS
5.0000 mg | ORAL_TABLET | Freq: Once | ORAL | Status: AC
  Administered 2018-07-09: 5 mg via ORAL
  Filled 2018-07-09: qty 1

## 2018-07-09 MED ORDER — IBUPROFEN 600 MG PO TABS: 600.0000 mg | ORAL_TABLET | Freq: Four times a day (QID) | ORAL | 0 refills | Status: DC | PRN

## 2018-07-09 NOTE — ED Provider Notes (Signed)
[facility] EMERGENCY DEPARTMENT Provider Note   CSN: [id number] Arrival date & time: 07/09/18  7654     History   Chief Complaint Chief Complaint  Patient presents with  . Back Pain    HPI Stacey Lucas is a 42 y.o. female who presents with back pain. PMH significant for Type 2 DM, depression. She states that she hurt her back while at work at [employer] a couple weeks ago. She was seen in the ED and was diagnosed with sciatica. She hasn't been able to really recover from that and then a couple days ago she was in her shower and her knees gave out and she fell backward and tried to catch herself. Since then she has had worsening diffuse low back pain. She wasn't able to go to work the past couple days due to pain. Today she was so sore and stiff she couldn't get out of bed and had difficulty walking and dressing herself so she came to the ED. The pain radiates down both legs and is a constant aching soreness. She reports numbness of her knees and her right arm. Of note she just had a stay in [facility] for depression and overdose on Flexeril  HPI  Past Medical History:  Diagnosis Date  . Diabetes mellitus without complication ([place])    Type II    Patient Active Problem List   Diagnosis Date Noted  . MDD (major depressive disorder), recurrent severe, without psychosis ([place]) 04/27/2018  . MDD (major depressive disorder), severe ([place]) 04/25/2018    Past Surgical History:  Procedure Laterality Date  . tubal       OB History   None      Home Medications    Prior to Admission medications   Medication Sig Start Date End Date Taking? Authorizing Provider  blood glucose meter kit and supplies KIT Check fasting blood sugar in the morning. 11/27/17   Jaynee Eagles, PA-C  citalopram (CELEXA) 20 MG tablet Take 1 tablet (20 mg total) by mouth daily. 04/28/18   Mordecai Maes, NP  hydrOXYzine (ATARAX/VISTARIL) 25 MG tablet Take 1 tablet (25 mg total) by mouth every 6 (six) hours  as needed for anxiety. 04/27/18   Mordecai Maes, NP  insulin glargine (LANTUS) 100 UNIT/ML injection Inject 0.15 mLs (15 Units total) into the skin at bedtime. 04/27/18   Mordecai Maes, NP  Insulin Pen Needle (PEN NEEDLES) 32G X 4 MM MISC 1 each by Does not apply route at bedtime. 01/30/18   Jaynee Eagles, PA-C  linagliptin (TRADJENTA) 5 MG TABS tablet Take 1 tablet (5 mg total) by mouth daily. 04/28/18   Mordecai Maes, NP  lisinopril (PRINIVIL,ZESTRIL) 5 MG tablet Take 1 tablet (5 mg total) by mouth daily. 04/27/18   Mordecai Maes, NP  metFORMIN (GLUCOPHAGE-XR) 500 MG 24 hr tablet Take 1 tablet (500 mg total) by mouth daily with breakfast. 04/27/18   Mordecai Maes, NP    Family History Family History  Problem Relation Age of Onset  . Diabetes Mother   . Hypertension Mother   . Arthritis Mother   . Diabetes Father   . Hypertension Father     Social History Social History   Tobacco Use  . Smoking status: Current Every Day Smoker    Packs/day: 1.00    Types: Cigarettes  . Smokeless tobacco: Never Used  Substance Use Topics  . Alcohol use: Yes    Comment: occasionally  . Drug use: No     Allergies  Patient has no known allergies.   Review of Systems Review of Systems  Musculoskeletal: Positive for arthralgias, back pain and myalgias. Negative for joint swelling.  Neurological: Positive for weakness and numbness.     Physical Exam Updated Vital Signs BP 125/70 (BP Location: Left Arm)   Pulse 80   Resp 16   LMP 06/18/2018   SpO2 100%   Physical Exam  Constitutional: She is oriented to person, place, and time. She appears well-developed and well-nourished. No distress.  HENT:  Head: Normocephalic and atraumatic.  Eyes: Pupils are equal, round, and reactive to light. Conjunctivae are normal. Right eye exhibits no discharge. Left eye exhibits no discharge. No scleral icterus.  Neck: Normal range of motion.  Cardiovascular: Normal rate.  Pulmonary/Chest:  Effort normal. No respiratory distress.  Abdominal: She exhibits no distension.  Musculoskeletal:  Back: Inspection: No masses, deformity, or rash Palpation: Diffuse low back tenderness Strength: 5/5 in lower extremities and normal plantar and dorsiflexion Sensation: Intact sensation with light touch in lower extremities bilaterally Reflexes: Patellar reflex is 2+ bilaterally SLR: Negative seated straight leg raise Gait: Antalgic  Neurological: She is alert and oriented to person, place, and time.  Skin: Skin is warm and dry.  Psychiatric: She has a normal mood and affect. Her behavior is normal.  Nursing note and vitals reviewed.    ED Treatments / Results  Labs (all labs ordered are listed, but only abnormal results are displayed) Labs Reviewed - No data to display  EKG None  Radiology Dg Lumbar Spine Complete  Result Date: 07/09/2018 CLINICAL DATA:  Per patient back pain X a couple of weeks. Reports the right sided is more severe than left. Patient reports pain is increasing and denies any injury. EXAM: LUMBAR SPINE - COMPLETE 4+ VIEW COMPARISON:  Chest x-ray on 06/18/2018 FINDINGS: Based on comparison with chest x-rays, there are diminutive ribs on L1. There is transitional type anatomy at S1. There is no acute fracture or subluxation. Alignment is normal. No suspicious lytic or blastic lesions are identified. Bowel gas pattern is nonobstructive. IMPRESSION: No evidence for acute  abnormality. Electronically Signed   By: Nolon Nations M.D.   On: 07/09/2018 09:48    Procedures Procedures (including critical care time)  Medications Ordered in ED Medications  ketorolac (TORADOL) injection 30 mg (30 mg Intramuscular Given 07/09/18 1005)  cyclobenzaprine (FLEXERIL) tablet 5 mg (5 mg Oral Given 07/09/18 1006)     Initial Impression / Assessment and Plan / ED Course  I have reviewed the triage vital signs and the nursing notes.  Pertinent labs & imaging results that were  available during my care of the patient were reviewed by me and considered in my medical decision making (see chart for details).  42 year old female with worsening low back pain after a fall couple days ago.  She has diffuse tenderness.  She has normal strength in her lower extremities.  No bowel or bladder incontinence.  Her x-ray is negative.  She states that she did get relief when she had Flexeril in the past however she overdosed on this a couple weeks ago.  She states that her mental health is much better now and she would not do that again.  She is given IM Toradol in the ED.  She was given prescription for ibuprofen and Flexeril and encouraged to follow-up with a primary care provider.  Final Clinical Impressions(s) / ED Diagnoses   Final diagnoses:  Acute bilateral low back pain without sciatica  ED Discharge Orders    None       Recardo Evangelist, Hershal Coria 07/09/18 1148    Virgel Manifold, MD 07/10/18 [phone]

## 2018-07-09 NOTE — Discharge Instructions (Signed)
Take Ibuprofen 60mg  three times daily for pain Take Flexeril as needed for muscle pain and spasms Use a heating pad for stiff, sore muscles Follow up with your doctor

## 2018-07-09 NOTE — ED Triage Notes (Signed)
Pt states she fell yesterday and has had lumbar back pain since. Pt states her knees gave out now she can barely walk.

## 2018-07-23 ENCOUNTER — Inpatient Hospital Stay: Payer: BC Managed Care – PPO | Admitting: Emergency Medicine

## 2018-07-30 ENCOUNTER — Inpatient Hospital Stay: Payer: BC Managed Care – PPO | Admitting: Emergency Medicine

## 2018-08-18 ENCOUNTER — Other Ambulatory Visit: Payer: Self-pay

## 2018-08-18 ENCOUNTER — Emergency Department (HOSPITAL_COMMUNITY)
Admission: EM | Admit: 2018-08-18 | Discharge: 2018-08-18 | Disposition: A | Discharge status: Home / Self Care | Payer: BC Managed Care – PPO | Attending: Emergency Medicine | Admitting: Emergency Medicine

## 2018-08-18 ENCOUNTER — Encounter (HOSPITAL_COMMUNITY): Payer: Self-pay | Admitting: Emergency Medicine

## 2018-08-18 DIAGNOSIS — S0003XA Contusion of scalp, initial encounter: Secondary | ICD-10-CM | POA: Diagnosis not present

## 2018-08-18 DIAGNOSIS — Y990 Civilian activity done for income or pay: Secondary | ICD-10-CM | POA: Insufficient documentation

## 2018-08-18 DIAGNOSIS — Z79899 Other long term (current) drug therapy: Secondary | ICD-10-CM | POA: Insufficient documentation

## 2018-08-18 DIAGNOSIS — Y939 Activity, unspecified: Secondary | ICD-10-CM | POA: Insufficient documentation

## 2018-08-18 DIAGNOSIS — E119 Type 2 diabetes mellitus without complications: Secondary | ICD-10-CM | POA: Insufficient documentation

## 2018-08-18 DIAGNOSIS — T148XXA Other injury of unspecified body region, initial encounter: Secondary | ICD-10-CM

## 2018-08-18 DIAGNOSIS — W208XXA Other cause of strike by thrown, projected or falling object, initial encounter: Secondary | ICD-10-CM | POA: Insufficient documentation

## 2018-08-18 DIAGNOSIS — Z794 Long term (current) use of insulin: Secondary | ICD-10-CM | POA: Insufficient documentation

## 2018-08-18 DIAGNOSIS — F17210 Nicotine dependence, cigarettes, uncomplicated: Secondary | ICD-10-CM | POA: Insufficient documentation

## 2018-08-18 DIAGNOSIS — Y9289 Other specified places as the place of occurrence of the external cause: Secondary | ICD-10-CM | POA: Diagnosis not present

## 2018-08-18 DIAGNOSIS — S0990XA Unspecified injury of head, initial encounter: Secondary | ICD-10-CM | POA: Diagnosis present

## 2018-08-18 MED ORDER — ACETAMINOPHEN 500 MG PO TABS
1000.0000 mg | ORAL_TABLET | Freq: Once | ORAL | Status: AC
  Administered 2018-08-18: 1000 mg via ORAL
  Filled 2018-08-18: qty 2

## 2018-08-18 NOTE — ED Provider Notes (Signed)
[facility] EMERGENCY DEPARTMENT Provider Note   CSN: [id number] Arrival date & time: 08/18/18  1548     History   Chief Complaint Chief Complaint  Patient presents with  . Head Injury    HPI Stacey Lucas is a 42 y.o. female diabetes who presents for evaluation of head injury that occurred yesterday.  Patient reports she was at work when something fell playing on the posterior aspect of her head.  Patient reports that she was dazed but had no LOC.  Patient is not sure what hit her on the head.  Patient reports she is currently not on any blood thinners.  Patient reports she had some bleeding from the area at the time but states that it has stopped.  Patient reports that she has had a knot on the back of her head that is sore.  She reports she has been taking intermittent ibuprofen with minimal improvement in pain.  She reports she was able to work today and do her job without any difficulty.  She reports she was able to walk without any difficulty.  Patient came today because she felt like the pain was getting worse at the night.  Patient reports she is not any vision changes, numbness/weakness of her arms or legs, nausea/vomiting.  The history is provided by the patient.    Past Medical History:  Diagnosis Date  . Diabetes mellitus without complication ([place])    Type II    Patient Active Problem List   Diagnosis Date Noted  . MDD (major depressive disorder), recurrent severe, without psychosis ([place]) 04/27/2018  . MDD (major depressive disorder), severe ([place]) 04/25/2018    Past Surgical History:  Procedure Laterality Date  . tubal       OB History   None      Home Medications    Prior to Admission medications   Medication Sig Start Date End Date Taking? Authorizing Provider  blood glucose meter kit and supplies KIT Check fasting blood sugar in the morning. 11/27/17   Jaynee Eagles, PA-C  citalopram (CELEXA) 20 MG tablet Take 1 tablet (20 mg total) by  mouth daily. 04/28/18   Mordecai Maes, NP  cyclobenzaprine (FLEXERIL) 10 MG tablet Take 1 tablet (10 mg total) by mouth 2 (two) times daily as needed for muscle spasms. 07/09/18   Recardo Evangelist, PA-C  hydrOXYzine (ATARAX/VISTARIL) 25 MG tablet Take 1 tablet (25 mg total) by mouth every 6 (six) hours as needed for anxiety. 04/27/18   Mordecai Maes, NP  ibuprofen (ADVIL,MOTRIN) 600 MG tablet Take 1 tablet (600 mg total) by mouth every 6 (six) hours as needed. 07/09/18   Recardo Evangelist, PA-C  insulin glargine (LANTUS) 100 UNIT/ML injection Inject 0.15 mLs (15 Units total) into the skin at bedtime. 04/27/18   Mordecai Maes, NP  Insulin Pen Needle (PEN NEEDLES) 32G X 4 MM MISC 1 each by Does not apply route at bedtime. 01/30/18   Jaynee Eagles, PA-C  linagliptin (TRADJENTA) 5 MG TABS tablet Take 1 tablet (5 mg total) by mouth daily. 04/28/18   Mordecai Maes, NP  lisinopril (PRINIVIL,ZESTRIL) 5 MG tablet Take 1 tablet (5 mg total) by mouth daily. 04/27/18   Mordecai Maes, NP  metFORMIN (GLUCOPHAGE-XR) 500 MG 24 hr tablet Take 1 tablet (500 mg total) by mouth daily with breakfast. 04/27/18   Mordecai Maes, NP    Family History Family History  Problem Relation Age of Onset  . Diabetes Mother   . Hypertension Mother   .  Arthritis Mother   . Diabetes Father   . Hypertension Father     Social History Social History   Tobacco Use  . Smoking status: Current Every Day Smoker    Packs/day: 1.00    Types: Cigarettes  . Smokeless tobacco: Never Used  Substance Use Topics  . Alcohol use: Yes    Comment: occasionally  . Drug use: No     Allergies   Patient has no known allergies.   Review of Systems Review of Systems  Eyes: Negative for visual disturbance.  Respiratory: Negative for shortness of breath.   Cardiovascular: Negative for chest pain.  Gastrointestinal: Negative for nausea and vomiting.  Neurological: Positive for headaches. Negative for weakness and numbness.    All other systems reviewed and are negative.    Physical Exam Updated Vital Signs BP 137/79 (BP Location: Right Arm)   Pulse 94   Temp 98.5 F (36.9 C) (Oral)   Resp 16   Ht '5\' 5"'$  (1.651 m)   Wt 72.6 kg   SpO2 100%   BMI 26.63 kg/m   Physical Exam  Constitutional: She appears well-developed and well-nourished.  HENT:  Head: Normocephalic and atraumatic.    Right Ear: Tympanic membrane normal. No hemotympanum.  Left Ear: Tympanic membrane normal. No hemotympanum.  No tenderness to palpation of skull. No deformities or crepitus noted. No open wounds, abrasions or lacerations.  No hemotympanum bilaterally.   Eyes: Conjunctivae and EOM are normal. Right eye exhibits no discharge. Left eye exhibits no discharge. No scleral icterus.  Pulmonary/Chest: Effort normal.  Neurological: She is alert.  Cranial nerves III-XII intact Follows commands, Moves all extremities  5/5 strength to BUE and BLE  Sensation intact throughout all major nerve distributions Normal finger to nose. No dysdiadochokinesia. No pronator drift. No gait abnormalities  No slurred speech. No facial droop.   Skin: Skin is warm and dry.  Psychiatric: She has a normal mood and affect. Her speech is normal and behavior is normal.  Nursing note and vitals reviewed.    ED Treatments / Results  Labs (all labs ordered are listed, but only abnormal results are displayed) Labs Reviewed - No data to display  EKG None  Radiology No results found.  Procedures Procedures (including critical care time)  Medications Ordered in ED Medications  acetaminophen (TYLENOL) tablet 1,000 mg (1,000 mg Oral Given 08/18/18 1713)     Initial Impression / Assessment and Plan / ED Course  I have reviewed the triage vital signs and the nursing notes.  Pertinent labs & imaging results that were available during my care of the patient were reviewed by me and considered in my medical decision making (see chart for  details).     42 year old female who presents for evaluation of head injury that occurred yesterday.  No LOC.  She has been able to work and do her job today.  She reports a knot to the back of her head and states that she has worsening pain.  Is been taking ibuprofen with no improvement.  She is not currently on blood thinners.  She denies any nausea, vomiting, numbness/weakness.  She states she has been able to walk with any difficulty.  Had a few episodes where she felt slightly dizzy but states that she has been able to complete her job without any difficulty. Patient is afebrile, non-toxic appearing, sitting comfortably on examination table. Vital signs reviewed and stable.  She with hematoma noted on back of head.  No laceration, open  wound that I can see.  Reassuring neuro exam.  Patient is not currently on blood thinners.  Do not suspect skull fracture, ICH given history/physical exam.  Given reassuring physical exam and per [facility] CT criteria, no imaging is indicated at this time.   I discussed treatment plan with patient.  She is agreeable.  Encourage at home supportive care measures. Patient had ample opportunity for questions and discussion. All patient's questions were answered with full understanding. Strict return precautions discussed. Patient expresses understanding and agreement to plan.   Final Clinical Impressions(s) / ED Diagnoses   Final diagnoses:  Hematoma  Minor head injury, initial encounter    ED Discharge Orders    None       Desma Mcgregor 08/18/18 2252    Daleen Bo, MD 08/19/18 2115

## 2018-08-18 NOTE — Discharge Instructions (Signed)
You can take Tylenol or Ibuprofen as directed for pain. You can alternate Tylenol and Ibuprofen every 4 hours. If you take Tylenol at 1pm, then you can take Ibuprofen at 5pm. Then you can take Tylenol again at 9pm.   Apply ice to help with the swelling.   [facility] to establish a primary care doctor if you do not have one.   Return to the emergency department for any worsening pain, vision changes, vomiting, numbness/weakness of your arms or legs, difficulty walking or any other worsening or concerning symptoms.

## 2018-08-18 NOTE — ED Triage Notes (Signed)
Pt presents to the ED from home. Pt states she was hit in the back right side of her head yesterday at work. Pt states it was bleeding but she got it to stop earlier. Pt states her pain is worsening and there is a knot.

## 2018-09-15 ENCOUNTER — Emergency Department (HOSPITAL_COMMUNITY)
Admission: EM | Admit: 2018-09-15 | Discharge: 2018-09-15 | Disposition: A | Discharge status: Home / Self Care | Payer: BC Managed Care – PPO | Attending: Emergency Medicine | Admitting: Emergency Medicine

## 2018-09-15 ENCOUNTER — Encounter (HOSPITAL_COMMUNITY): Payer: Self-pay

## 2018-09-15 DIAGNOSIS — E119 Type 2 diabetes mellitus without complications: Secondary | ICD-10-CM | POA: Diagnosis not present

## 2018-09-15 DIAGNOSIS — F17210 Nicotine dependence, cigarettes, uncomplicated: Secondary | ICD-10-CM | POA: Diagnosis not present

## 2018-09-15 DIAGNOSIS — J029 Acute pharyngitis, unspecified: Secondary | ICD-10-CM

## 2018-09-15 DIAGNOSIS — R51 Headache: Secondary | ICD-10-CM | POA: Insufficient documentation

## 2018-09-15 DIAGNOSIS — R000 Tachycardia, unspecified: Secondary | ICD-10-CM | POA: Diagnosis not present

## 2018-09-15 LAB — GROUP A STREP BY PCR: Group A Strep by PCR: NOT DETECTED

## 2018-09-15 MED ORDER — DEXAMETHASONE 10 MG/ML FOR PEDIATRIC ORAL USE
10.0000 mg | Freq: Once | INTRAMUSCULAR | Status: AC
  Administered 2018-09-15: 10 mg via ORAL
  Filled 2018-09-15: qty 1

## 2018-09-15 NOTE — Discharge Instructions (Signed)

## 2018-09-15 NOTE — ED Triage Notes (Signed)
Pt reports sore throat for 3 days, states it is hard to swallow.

## 2018-09-15 NOTE — ED Notes (Signed)
Pharmacy notified of need for medication

## 2018-09-15 NOTE — ED Provider Notes (Signed)
Emergency Department Provider Note   I have reviewed the triage vital signs and the nursing notes.   HISTORY  Chief Complaint Sore Throat   HPI Stacey Lucas is a 43 y.o. female with PMH of DM presents to the emergency department with sore throat for the past 3 days.  She has pain with swallowing but is able to swallow both solids and liquids.  No voice changes.  Patient reports some subjective fevers.  No URI symptoms, she does have a mild headache.  She is been trying over-the-counter medications.  No cough.  No close contacts with similar symptoms.  No radiation of pain or other modifying factors.   Past Medical History:  Diagnosis Date  . Diabetes mellitus without complication ([facility])    Type II    Patient Active Problem List   Diagnosis Date Noted  . MDD (major depressive disorder), recurrent severe, without psychosis ([facility]) 04/27/2018  . MDD (major depressive disorder), severe ([facility]) 04/25/2018    Past Surgical History:  Procedure Laterality Date  . tubal     Allergies Patient has no known allergies.  Family History  Problem Relation Age of Onset  . Diabetes Mother   . Hypertension Mother   . Arthritis Mother   . Diabetes Father   . Hypertension Father     Social History Social History   Tobacco Use  . Smoking status: Current Every Day Smoker    Packs/day: 1.00    Types: Cigarettes  . Smokeless tobacco: Never Used  Substance Use Topics  . Alcohol use: Yes    Comment: occasionally  . Drug use: No    Review of Systems  Constitutional: No fever/chills Eyes: No visual changes. ENT: Positive sore throat. Cardiovascular: Denies chest pain. Respiratory: Denies shortness of breath. Gastrointestinal: No abdominal pain.  No nausea, no vomiting.  No diarrhea.  No constipation. Genitourinary: Negative for dysuria. Musculoskeletal: Negative for back pain. Skin: Negative for rash. Neurological: Negative for focal weakness or numbness. Positive HA.    10-point ROS otherwise negative.  ____________________________________________   PHYSICAL EXAM:  VITAL SIGNS: ED Triage Vitals  Enc Vitals Group     BP 09/15/18 1527 (!) 166/77     Pulse Rate 09/15/18 1527 (!) 106     Resp 09/15/18 1527 16     Temp 09/15/18 1527 99 F (37.2 C)     Temp Source 09/15/18 1527 Oral     SpO2 09/15/18 1527 100 %     Pain Score 09/15/18 1526 10   Constitutional: Alert and oriented. Well appearing and in no acute distress. Eyes: Conjunctivae are normal.  Head: Atraumatic. Nose: No congestion/rhinnorhea. Mouth/Throat: Mucous membranes are moist.  Oropharynx is erythematous with trace exudate. No PTA. No trismus. Clear voice and managing oral secretions.  Neck: No stridor.  Cardiovascular: Tachycardia. Good peripheral circulation. Grossly normal heart sounds.   Respiratory: Normal respiratory effort.  No retractions. Lungs CTAB. Gastrointestinal: Soft and nontender. No distention.  Musculoskeletal: No lower extremity tenderness nor edema. No gross deformities of extremities. Neurologic:  Normal speech and language. No gross focal neurologic deficits are appreciated.  Skin:  Skin is warm, dry and intact. No rash noted.  ____________________________________________   LABS (all labs ordered are listed, but only abnormal results are displayed)  Labs Reviewed  GROUP A STREP BY PCR   ____________________________________________   PROCEDURES  Procedure(s) performed:   Procedures  None ____________________________________________   INITIAL IMPRESSION / ASSESSMENT AND PLAN / ED COURSE  Pertinent labs & imaging  results that were available during my care of the patient were reviewed by me and considered in my medical decision making (see chart for details).  Patient presents to the emergency department for evaluation of sore throat.  She has mild tachycardia here with no fever.  She is well-appearing without trismus and is managing oral  secretions.  She does have some oropharyngeal erythema.  My suspicion for deep space neck infection is very low.  I do not believe the patient would benefit from advanced imaging of the neck at this time.  Plan for rapid strep.  If negative, plan for steroid and supportive care at home.   Strep negative. Patient tolerating PO in the ED. Will discharge after steroid and plan on PCP follow up.  ____________________________________________  FINAL CLINICAL IMPRESSION(S) / ED DIAGNOSES  Final diagnoses:  Viral pharyngitis    MEDICATIONS GIVEN DURING THIS VISIT:  Medications  dexamethasone (DECADRON) 10 MG/ML injection for Pediatric ORAL use 10 mg (has no administration in time range)    Note:  This document was prepared using Dragon voice recognition software and may include unintentional dictation errors.  Alona Bene, MD Emergency Medicine    , Arlyss Repress, MD 09/15/18 1640

## 2018-11-05 ENCOUNTER — Other Ambulatory Visit: Payer: Self-pay

## 2018-11-05 ENCOUNTER — Emergency Department (HOSPITAL_COMMUNITY)
Admission: EM | Admit: 2018-11-05 | Discharge: 2018-11-05 | Disposition: A | Discharge status: Home / Self Care | Payer: BC Managed Care – PPO | Attending: Emergency Medicine | Admitting: Emergency Medicine

## 2018-11-05 ENCOUNTER — Encounter (HOSPITAL_COMMUNITY): Payer: Self-pay

## 2018-11-05 DIAGNOSIS — Z79899 Other long term (current) drug therapy: Secondary | ICD-10-CM | POA: Insufficient documentation

## 2018-11-05 DIAGNOSIS — Z7984 Long term (current) use of oral hypoglycemic drugs: Secondary | ICD-10-CM | POA: Insufficient documentation

## 2018-11-05 DIAGNOSIS — E1140 Type 2 diabetes mellitus with diabetic neuropathy, unspecified: Secondary | ICD-10-CM | POA: Diagnosis not present

## 2018-11-05 DIAGNOSIS — F329 Major depressive disorder, single episode, unspecified: Secondary | ICD-10-CM | POA: Diagnosis not present

## 2018-11-05 DIAGNOSIS — R202 Paresthesia of skin: Secondary | ICD-10-CM | POA: Diagnosis present

## 2018-11-05 DIAGNOSIS — F17210 Nicotine dependence, cigarettes, uncomplicated: Secondary | ICD-10-CM | POA: Insufficient documentation

## 2018-11-05 DIAGNOSIS — E1143 Type 2 diabetes mellitus with diabetic autonomic (poly)neuropathy: Secondary | ICD-10-CM

## 2018-11-05 LAB — COMPREHENSIVE METABOLIC PANEL
ALBUMIN: 3 g/dL — AB (ref 3.5–5.0)
ALT: 7 U/L (ref 0–44)
AST: 12 U/L — AB (ref 15–41)
Alkaline Phosphatase: 75 U/L (ref 38–126)
Anion gap: 10 (ref 5–15)
BILIRUBIN TOTAL: 1.3 mg/dL — AB (ref 0.3–1.2)
BUN: 11 mg/dL (ref 6–20)
CO2: 25 mmol/L (ref 22–32)
Calcium: 9.3 mg/dL (ref 8.9–10.3)
Chloride: 104 mmol/L (ref 98–111)
Creatinine, Ser: 0.8 mg/dL (ref 0.44–1.00)
GFR calc Af Amer: >60 mL/min (ref >60)
GFR calc non Af Amer: >60 mL/min (ref >60)
GLUCOSE: 203 mg/dL — AB (ref 70–99)
POTASSIUM: 4 mmol/L (ref 3.5–5.1)
Sodium: 139 mmol/L (ref 135–145)
TOTAL PROTEIN: 6.7 g/dL (ref 6.5–8.1)

## 2018-11-05 LAB — CBC
HCT: 40 % (ref 36.0–46.0)
Hemoglobin: 13.2 g/dL (ref 12.0–15.0)
MCH: 26.6 pg (ref 26.0–34.0)
MCHC: 33 g/dL (ref 30.0–36.0)
MCV: 80.6 fL (ref 80.0–100.0)
PLATELETS: 284 10*3/uL (ref 150–400)
RBC: 4.96 MIL/uL (ref 3.87–5.11)
RDW: 15.1 % (ref 11.5–15.5)
WBC: 9.2 10*3/uL (ref 4.0–10.5)
nRBC: 0 % (ref 0.0–0.2)

## 2018-11-05 MED ORDER — GABAPENTIN 300 MG PO CAPS
300.0000 mg | ORAL_CAPSULE | Freq: Once | ORAL | Status: AC
  Administered 2018-11-05: 300 mg via ORAL
  Filled 2018-11-05: qty 1

## 2018-11-05 MED ORDER — GABAPENTIN 300 MG PO CAPS: 300.0000 mg | ORAL_CAPSULE | Freq: Three times a day (TID) | ORAL | 0 refills | Status: DC

## 2018-11-05 NOTE — ED Triage Notes (Signed)
Pt states both feet have been swelling up in the mornings for the last week.  States she feels like they are burning. Hx of diabetes and takes metformin.

## 2018-11-05 NOTE — Discharge Instructions (Signed)
Recommend seeing an endocrinologist for your diabetes.  Your glucose or sugar today was

## 2018-11-05 NOTE — ED Provider Notes (Signed)
[facility] EMERGENCY DEPARTMENT Provider Note   CSN: [id number] Arrival date & time: 11/05/18  1728    History   Chief Complaint Chief Complaint  Patient presents with  . Foot Swelling    bilateral    HPI Stacey Lucas is a 43 y.o. female.     Long-term diabetic is concerned about numbness, tingling, and burning in her feet and associated swelling for a long period of time..  No chest pain or dyspnea.  She takes metformin for her type 2 diabetes.  Severity of symptoms is moderate.  Nothing makes symptoms better or worse.     Past Medical History:  Diagnosis Date  . Diabetes mellitus without complication (HCC)    Type II    Patient Active Problem List   Diagnosis Date Noted  . MDD (major depressive disorder), recurrent severe, without psychosis (HCC) 04/27/2018  . MDD (major depressive disorder), severe (HCC) 04/25/2018    Past Surgical History:  Procedure Laterality Date  . tubal       OB History   No obstetric history on file.      Home Medications    Prior to Admission medications   Medication Sig Start Date End Date Taking? Authorizing Provider  metFORMIN (GLUCOPHAGE-XR) 500 MG 24 hr tablet Take 1 tablet (500 mg total) by mouth daily with breakfast. 04/27/18  Yes Thomas, Lashunda, NP  blood glucose meter kit and supplies KIT Check fasting blood sugar in the morning. 11/27/17   Mani, Mario, PA-C  gabapentin (NEURONTIN) 300 MG capsule Take 1 capsule (300 mg total) by mouth 3 (three) times daily. 11/05/18   Cook, Brian, MD  insulin glargine (LANTUS) 100 UNIT/ML injection Inject 0.15 mLs (15 Units total) into the skin at bedtime. Patient not taking: Reported on 11/05/2018 04/27/18   Thomas, Lashunda, NP  Insulin Pen Needle (PEN NEEDLES) 32G X 4 MM MISC 1 each by Does not apply route at bedtime. 01/30/18   Mani, Mario, PA-C  lisinopril (PRINIVIL,ZESTRIL) 5 MG tablet Take 1 tablet (5 mg total) by mouth daily. 04/27/18   Thomas, Lashunda, NP     Family History Family History  Problem Relation Age of Onset  . Diabetes Mother   . Hypertension Mother   . Arthritis Mother   . Diabetes Father   . Hypertension Father     Social History Social History   Tobacco Use  . Smoking status: Current Every Day Smoker    Packs/day: 1.00    Types: Cigarettes  . Smokeless tobacco: Never Used  Substance Use Topics  . Alcohol use: Yes    Comment: occasionally  . Drug use: No     Allergies   Patient has no known allergies.   Review of Systems Review of Systems  All other systems reviewed and are negative.    Physical Exam Updated Vital Signs BP (!) 116/55   Pulse 92   Temp 98.5 F (36.9 C) (Oral)   Resp 18   SpO2 99%   Physical Exam Vitals signs and nursing note reviewed.  Constitutional:      Appearance: She is well-developed.  HENT:     Head: Normocephalic and atraumatic.  Eyes:     Conjunctiva/sclera: Conjunctivae normal.  Neck:     Musculoskeletal: Neck supple.  Cardiovascular:     Rate and Rhythm: Normal rate and regular rhythm.  Pulmonary:     Effort: Pulmonary effort is normal.     Breath sounds: Normal breath sounds.  Abdominal:       General: Bowel sounds are normal.     Palpations: Abdomen is soft.  Musculoskeletal: Normal range of motion.  Skin:    General: Skin is warm and dry.  Neurological:     General: No focal deficit present.     Mental Status: She is alert and oriented to person, place, and time.     Comments: Sensation intact in bilateral lower extremities  Psychiatric:        Behavior: Behavior normal.      ED Treatments / Results  Labs (all labs ordered are listed, but only abnormal results are displayed) Labs Reviewed  COMPREHENSIVE METABOLIC PANEL - Abnormal; Notable for the following components:      Result Value   Glucose, Bld 203 (*)    Albumin 3.0 (*)    AST 12 (*)    Total Bilirubin 1.3 (*)    All other components within normal limits  CBC     EKG None  Radiology No results found.  Procedures Procedures (including critical care time)  Medications Ordered in ED Medications  gabapentin (NEURONTIN) capsule 300 mg (300 mg Oral Given 11/05/18 2153)     Initial Impression / Assessment and Plan / ED Course  I have reviewed the triage vital signs and the nursing notes.  Pertinent labs & imaging results that were available during my care of the patient were reviewed by me and considered in my medical decision making (see chart for details).        History and physical most consistent with diabetic neuropathy.  Glucose minimally elevated.  Will initiate gabapentin 300 mg 3 times daily.  She will follow-up with an endocrinologist.  Final Clinical Impressions(s) / ED Diagnoses   Final diagnoses:  Diabetic autonomic neuropathy associated with type 2 diabetes mellitus (HCC)    ED Discharge Orders         Ordered    gabapentin (NEURONTIN) 300 MG capsule  3 times daily     11/05/18 2338           Cook, Brian, MD 11/05/18 2353  

## 2019-02-23 ENCOUNTER — Telehealth: Payer: Self-pay

## 2019-02-23 DIAGNOSIS — Z20822 Contact with and (suspected) exposure to covid-19: Secondary | ICD-10-CM

## 2019-02-23 NOTE — Telephone Encounter (Signed)
Incoming call from Dr.  Ival Bible office .  Collene Mares calling requesting Covid Testing  For Patient.  Telephone call to patient. Patient is scheduled for Wednesday 02/24/19 at 230 pm  Patient voces understanding.

## 2019-02-24 ENCOUNTER — Other Ambulatory Visit: Payer: Self-pay

## 2019-02-24 DIAGNOSIS — Z20822 Contact with and (suspected) exposure to covid-19: Secondary | ICD-10-CM

## 2019-02-27 LAB — NOVEL CORONAVIRUS, NAA: SARS-CoV-2, NAA: NOT DETECTED

## 2019-04-27 ENCOUNTER — Ambulatory Visit
Admission: RE | Admit: 2019-04-27 | Discharge: 2019-04-27 | Disposition: A | Discharge status: Home / Self Care | Payer: BC Managed Care – PPO | Source: Ambulatory Visit | Attending: Family Medicine | Admitting: Family Medicine

## 2019-04-27 ENCOUNTER — Other Ambulatory Visit: Payer: Self-pay | Admitting: Family Medicine

## 2019-04-27 DIAGNOSIS — M5412 Radiculopathy, cervical region: Secondary | ICD-10-CM

## 2019-05-20 ENCOUNTER — Ambulatory Visit: Payer: BC Managed Care – PPO

## 2019-05-20 ENCOUNTER — Encounter: Payer: Self-pay | Admitting: Podiatry

## 2019-05-20 ENCOUNTER — Other Ambulatory Visit: Payer: Self-pay | Admitting: Podiatry

## 2019-05-20 ENCOUNTER — Ambulatory Visit (INDEPENDENT_AMBULATORY_CARE_PROVIDER_SITE_OTHER): Payer: BC Managed Care – PPO

## 2019-05-20 ENCOUNTER — Other Ambulatory Visit: Payer: Self-pay

## 2019-05-20 ENCOUNTER — Ambulatory Visit: Payer: BC Managed Care – PPO | Admitting: Podiatry

## 2019-05-20 DIAGNOSIS — M79671 Pain in right foot: Secondary | ICD-10-CM

## 2019-05-20 DIAGNOSIS — E1149 Type 2 diabetes mellitus with other diabetic neurological complication: Secondary | ICD-10-CM

## 2019-05-20 DIAGNOSIS — M7751 Other enthesopathy of right foot: Secondary | ICD-10-CM | POA: Diagnosis not present

## 2019-05-20 DIAGNOSIS — M79675 Pain in left toe(s): Secondary | ICD-10-CM

## 2019-05-20 DIAGNOSIS — E1140 Type 2 diabetes mellitus with diabetic neuropathy, unspecified: Secondary | ICD-10-CM

## 2019-05-20 DIAGNOSIS — B351 Tinea unguium: Secondary | ICD-10-CM | POA: Diagnosis not present

## 2019-05-20 DIAGNOSIS — M779 Enthesopathy, unspecified: Secondary | ICD-10-CM

## 2019-05-20 DIAGNOSIS — M79674 Pain in right toe(s): Secondary | ICD-10-CM | POA: Diagnosis not present

## 2019-05-20 DIAGNOSIS — M25571 Pain in right ankle and joints of right foot: Secondary | ICD-10-CM

## 2019-05-20 NOTE — Progress Notes (Signed)
   Subjective:    Patient ID: Stacey Lucas, female    DOB: 05-20-1976, 43 y.o.   MRN: [id number]  HPI    Review of Systems  All other systems reviewed and are negative.      Objective:   Physical Exam        Assessment & Plan:

## 2019-05-22 NOTE — Progress Notes (Signed)
Subjective:   Patient ID: Stacey Lucas, female   DOB: 43 y.o.   MRN: [id number]   HPI Patient presents stating she is had a lot of pain in her right ankle she has flatfeet and she has nails that she cannot take care of and become gradually more of an issue over time.  Patient states that she has had foot problems for a long time patient does not smoke and would like to be more active   Review of Systems  All other systems reviewed and are negative.       Objective:  Physical Exam Vitals signs and nursing note reviewed.  Constitutional:      Appearance: She is well-developed.  Pulmonary:     Effort: Pulmonary effort is normal.  Musculoskeletal: Normal range of motion.  Skin:    General: Skin is warm.  Neurological:     Mental Status: She is alert.     Neurovascular status intact muscle strength was found to be adequate range of motion within normal limits with patient noted to have exquisite discomfort in the right sinus tarsi with inflammation fluid buildup moderate depression of the arch and is noted to have good digital perfusion well oriented x3 F2 I noted all nailbeds to be very thickened and dystrophic and she cannot cut     Assessment:  Acute sinus tarsitis right with inflammation fluid buildup along with flatfoot deformity and nail disease 1-5 both feet that are thick yellow and painful     Plan:  H&P x-ray reviewed and today I went ahead did sterile prep and injected the sinus tarsi right 3 mg Kenalog 5 mg Xylocaine and debrided nailbeds 1-5 both feet with no iatrogenic bleeding noted.  Discussed long-term orthotics which I think would be in her benefit see back 1 month and that plan will be put into place  X-ray indicates significant depression of the arch with no other indications of significant pathology signed visit

## 2019-06-17 ENCOUNTER — Ambulatory Visit: Payer: BC Managed Care – PPO | Admitting: Podiatry

## 2019-06-17 ENCOUNTER — Other Ambulatory Visit: Payer: Self-pay

## 2019-06-17 ENCOUNTER — Encounter: Payer: Self-pay | Admitting: Podiatry

## 2019-06-17 DIAGNOSIS — E1149 Type 2 diabetes mellitus with other diabetic neurological complication: Secondary | ICD-10-CM

## 2019-06-17 DIAGNOSIS — M779 Enthesopathy, unspecified: Secondary | ICD-10-CM | POA: Diagnosis not present

## 2019-06-17 DIAGNOSIS — E1140 Type 2 diabetes mellitus with diabetic neuropathy, unspecified: Secondary | ICD-10-CM

## 2019-06-21 NOTE — Progress Notes (Signed)
Subjective:   Patient ID: Stacey Lucas, female   DOB: 43 y.o.   MRN: [id number]   HPI Patient states the right ankle has started to hurt on the outside and also she knows she needs some type of inserts for her feet due to her flatfoot deformity   ROS      Objective:  Physical Exam  Neurovascular status intact with inflammatory changes of the sinus tarsi right with fluid buildup and pain with palpation along with flatfoot deformity noted bilateral     Assessment:  Inflammatory capsulitis of the sinus tarsi right along with flatfoot deformity bilateral     Plan:  H&P condition reviewed sterile prep done and injected the sinus tarsi right 3 mg Kenalog 5 mg Xylocaine and went ahead casted for functional orthotics to lift the arch have given instructions on orthotic usage.  Patient will be seen back by ped orthotist for dispensing of orthotics and if any ankle pain were to be present will see me

## 2019-07-09 ENCOUNTER — Other Ambulatory Visit: Payer: Self-pay | Admitting: Family Medicine

## 2019-07-09 ENCOUNTER — Ambulatory Visit
Admission: RE | Admit: 2019-07-09 | Discharge: 2019-07-09 | Disposition: A | Discharge status: Home / Self Care | Payer: BC Managed Care – PPO | Source: Ambulatory Visit | Attending: Family Medicine | Admitting: Family Medicine

## 2019-07-09 DIAGNOSIS — S99922A Unspecified injury of left foot, initial encounter: Secondary | ICD-10-CM

## 2019-07-15 ENCOUNTER — Other Ambulatory Visit: Payer: BC Managed Care – PPO | Admitting: Orthotics

## 2019-08-16 ENCOUNTER — Other Ambulatory Visit: Payer: BC Managed Care – PPO | Admitting: Orthotics

## 2019-10-27 ENCOUNTER — Emergency Department (HOSPITAL_COMMUNITY): Payer: BC Managed Care – PPO

## 2019-10-27 ENCOUNTER — Emergency Department (HOSPITAL_COMMUNITY)
Admission: EM | Admit: 2019-10-27 | Discharge: 2019-10-27 | Disposition: A | Discharge status: Home / Self Care | Payer: BC Managed Care – PPO | Attending: Emergency Medicine | Admitting: Emergency Medicine

## 2019-10-27 ENCOUNTER — Other Ambulatory Visit: Payer: Self-pay

## 2019-10-27 ENCOUNTER — Encounter (HOSPITAL_COMMUNITY): Payer: Self-pay

## 2019-10-27 DIAGNOSIS — R531 Weakness: Secondary | ICD-10-CM | POA: Insufficient documentation

## 2019-10-27 DIAGNOSIS — E119 Type 2 diabetes mellitus without complications: Secondary | ICD-10-CM | POA: Diagnosis not present

## 2019-10-27 DIAGNOSIS — R197 Diarrhea, unspecified: Secondary | ICD-10-CM | POA: Diagnosis not present

## 2019-10-27 DIAGNOSIS — R1084 Generalized abdominal pain: Secondary | ICD-10-CM | POA: Diagnosis not present

## 2019-10-27 DIAGNOSIS — F17210 Nicotine dependence, cigarettes, uncomplicated: Secondary | ICD-10-CM | POA: Diagnosis not present

## 2019-10-27 DIAGNOSIS — Z7984 Long term (current) use of oral hypoglycemic drugs: Secondary | ICD-10-CM | POA: Diagnosis not present

## 2019-10-27 DIAGNOSIS — N3000 Acute cystitis without hematuria: Secondary | ICD-10-CM | POA: Insufficient documentation

## 2019-10-27 DIAGNOSIS — Z20822 Contact with and (suspected) exposure to covid-19: Secondary | ICD-10-CM | POA: Diagnosis not present

## 2019-10-27 DIAGNOSIS — M7918 Myalgia, other site: Secondary | ICD-10-CM | POA: Diagnosis present

## 2019-10-27 DIAGNOSIS — R112 Nausea with vomiting, unspecified: Secondary | ICD-10-CM | POA: Insufficient documentation

## 2019-10-27 LAB — CBC
HCT: 46.2 % — ABNORMAL HIGH (ref 36.0–46.0)
Hemoglobin: 15.2 g/dL — ABNORMAL HIGH (ref 12.0–15.0)
MCH: 26.6 pg (ref 26.0–34.0)
MCHC: 32.9 g/dL (ref 30.0–36.0)
MCV: 80.9 fL (ref 80.0–100.0)
Platelets: 251 10*3/uL (ref 150–400)
RBC: 5.71 MIL/uL — ABNORMAL HIGH (ref 3.87–5.11)
RDW: 15 % (ref 11.5–15.5)
WBC: 11.2 10*3/uL — ABNORMAL HIGH (ref 4.0–10.5)
nRBC: 0 % (ref 0.0–0.2)

## 2019-10-27 LAB — COMPREHENSIVE METABOLIC PANEL
ALT: 9 U/L (ref 0–44)
AST: 15 U/L (ref 15–41)
Albumin: 2.4 g/dL — ABNORMAL LOW (ref 3.5–5.0)
Alkaline Phosphatase: 84 U/L (ref 38–126)
Anion gap: 9 (ref 5–15)
BUN: 9 mg/dL (ref 6–20)
CO2: 25 mmol/L (ref 22–32)
Calcium: 8.9 mg/dL (ref 8.9–10.3)
Chloride: 108 mmol/L (ref 98–111)
Creatinine, Ser: 0.79 mg/dL (ref 0.44–1.00)
GFR calc Af Amer: >60 mL/min (ref >60)
GFR calc non Af Amer: >60 mL/min (ref >60)
Glucose, Bld: 178 mg/dL — ABNORMAL HIGH (ref 70–99)
Potassium: 3.9 mmol/L (ref 3.5–5.1)
Sodium: 142 mmol/L (ref 135–145)
Total Bilirubin: 1 mg/dL (ref 0.3–1.2)
Total Protein: 5.9 g/dL — ABNORMAL LOW (ref 6.5–8.1)

## 2019-10-27 LAB — URINALYSIS, ROUTINE W REFLEX MICROSCOPIC
Bilirubin Urine: NEGATIVE
Glucose, UA: >=500 mg/dL — AB
Ketones, ur: NEGATIVE mg/dL
Nitrite: NEGATIVE
Protein, ur: >=300 mg/dL — AB
Specific Gravity, Urine: 1.024 (ref 1.005–1.030)
pH: 5 (ref 5.0–8.0)

## 2019-10-27 LAB — POC SARS CORONAVIRUS 2 AG -  ED: SARS Coronavirus 2 Ag: NEGATIVE

## 2019-10-27 LAB — I-STAT BETA HCG BLOOD, ED (MC, WL, AP ONLY): I-stat hCG, quantitative: <5 m[IU]/mL (ref <5)

## 2019-10-27 LAB — LIPASE, BLOOD: Lipase: 24 U/L (ref 11–51)

## 2019-10-27 LAB — CBG MONITORING, ED: Glucose-Capillary: 181 mg/dL — ABNORMAL HIGH (ref 70–99)

## 2019-10-27 MED ORDER — FENTANYL CITRATE (PF) 100 MCG/2ML IJ SOLN
50.0000 ug | Freq: Once | INTRAMUSCULAR | Status: AC
  Administered 2019-10-27: 50 ug via INTRAVENOUS
  Filled 2019-10-27: qty 2

## 2019-10-27 MED ORDER — ONDANSETRON HCL 4 MG/2ML IJ SOLN
4.0000 mg | Freq: Once | INTRAMUSCULAR | Status: AC
  Administered 2019-10-27: 4 mg via INTRAVENOUS
  Filled 2019-10-27: qty 2

## 2019-10-27 MED ORDER — CEPHALEXIN 250 MG PO CAPS
500.0000 mg | ORAL_CAPSULE | Freq: Once | ORAL | Status: AC
  Administered 2019-10-27: 500 mg via ORAL
  Filled 2019-10-27: qty 2

## 2019-10-27 MED ORDER — IOHEXOL 300 MG/ML  SOLN
100.0000 mL | Freq: Once | INTRAMUSCULAR | Status: AC | PRN
  Administered 2019-10-27: 100 mL via INTRAVENOUS

## 2019-10-27 MED ORDER — SODIUM CHLORIDE 0.9 % IV BOLUS
1000.0000 mL | Freq: Once | INTRAVENOUS | Status: AC
  Administered 2019-10-27: 1000 mL via INTRAVENOUS

## 2019-10-27 MED ORDER — CEPHALEXIN 500 MG PO CAPS: 500.0000 mg | ORAL_CAPSULE | Freq: Four times a day (QID) | ORAL | 0 refills | Status: AC

## 2019-10-27 NOTE — Discharge Instructions (Addendum)
Take antibiotics as directed. Please take all of your antibiotics until finished.  Check your sugars and make sure that they are being controlled.  Return the emergency department for any worsening abdominal pain, fevers, vomiting or any other worsening concerning symptoms.  You have a Covid test pending.  You should quarantine until results come back.  If you are positive, you will need to quarantine for 2 weeks.

## 2019-10-27 NOTE — ED Notes (Signed)
Pt was discharged from the ED. Pt read and understood discharge paperwork. Pt had vital signs completed. Pt conscious, breathing, and A&Ox4. No distress noted. Pt speaking in complete sentences. Pt ambulated out of the ED with a smooth and steady gait. E-signature not available.  

## 2019-10-27 NOTE — ED Triage Notes (Signed)
Pt arrives POV for eval of generalized body aches/weakness, and nausea vomiting since 0300 this AM. States she feels as though her sugar is high, states was 320 this AM. Denies CP,SOB. Denies known covid contacts

## 2019-10-27 NOTE — ED Provider Notes (Signed)
[facility] EMERGENCY DEPARTMENT Provider Note   CSN: [id number] Arrival date & time: 10/27/19  1211     History Chief Complaint  Patient presents with  . Emesis  . Generalized Body Aches    Stacey Lucas is a 44 y.o. female with past medical history of diabetes who presents for evaluation of generalized body aches, abdominal pain, nausea/vomiting and diarrhea that began acutely at about 3 AM this morning.  She states that when she went to bed last night, she was feeling in her normal state of health.  She states that 3 AM, she woke up acutely with nausea, vomiting, diarrhea.  No blood noted in stool or emesis.  She states afterwards, she started having some generalized abdominal pain with no focal point.  She states she attempted to rest throughout the day but then felt like she was having body aches and just felt bad, prompting ED visit.  She states that after vomiting, she has some throat pain that radiates into her chest that is a burning sensation.  No active chest pain.  This all started after she had been vomiting.  She did not eat any abnormal foods.  Nobody in the family has similar symptoms.  She denies any recent travel, known COVID-19 exposure.  She does have history of diabetes and states that she has been compliant with her medications.  She reports she has been checking her sugars recently and they have been within normal limits.  She denies any fevers, difficulty breathing, dysuria, hematuria.  The history is provided by the patient.       Past Medical History:  Diagnosis Date  . Diabetes mellitus without complication ([place])    Type II    Patient Active Problem List   Diagnosis Date Noted  . MDD (major depressive disorder), recurrent severe, without psychosis ([place]) 04/27/2018  . MDD (major depressive disorder), severe ([place]) 04/25/2018    Past Surgical History:  Procedure Laterality Date  . tubal       OB History   No obstetric history on file.     Family History  Problem Relation Age of Onset  . Diabetes Mother   . Hypertension Mother   . Arthritis Mother   . Diabetes Father   . Hypertension Father     Social History   Tobacco Use  . Smoking status: Current Every Day Smoker    Packs/day: 1.00    Types: Cigarettes  . Smokeless tobacco: Never Used  Substance Use Topics  . Alcohol use: Yes    Comment: occasionally  . Drug use: No    Home Medications Prior to Admission medications   Medication Sig Start Date End Date Taking? Authorizing Provider  amoxicillin (AMOXIL) 500 MG tablet TK 1 T PO Q 8 H TAT 05/25/19   [provider]  blood glucose meter kit and supplies KIT Check fasting blood sugar in the morning. 11/27/17   Jaynee Eagles, PA-C  cephALEXin (KEFLEX) 500 MG capsule Take 1 capsule (500 mg total) by mouth 4 (four) times daily for 7 days. 10/27/19 11/03/19  Volanda Napoleon, PA-C  gabapentin (NEURONTIN) 300 MG capsule Take 1 capsule (300 mg total) by mouth 3 (three) times daily. 11/05/18   Nat Christen, MD  ibuprofen (ADVIL) 600 MG tablet TK 1 T PO Q 6 TO 8 H PRN 05/26/19   [provider]  Insulin Glargine (BASAGLAR KWIKPEN) 100 UNIT/ML SOPN  06/14/19   [provider]  Insulin Pen Needle (PEN NEEDLES)  32G X 4 MM MISC 1 each by Does not apply route at bedtime. 01/30/18   Jaynee Eagles, PA-C  lisinopril (PRINIVIL,ZESTRIL) 5 MG tablet Take 1 tablet (5 mg total) by mouth daily. 04/27/18   Mordecai Maes, NP  metFORMIN (GLUCOPHAGE-XR) 500 MG 24 hr tablet Take 1 tablet (500 mg total) by mouth daily with breakfast. 04/27/18   Mordecai Maes, NP  OZEMPIC, 0.25 OR 0.5 MG/DOSE, 2 MG/1.5ML SOPN INJECT 0.25MG SQ WEEKLY 06/11/19   [provider]    Allergies    Patient has no known allergies.  Review of Systems   Review of Systems  Constitutional: Negative for chills and fever.  HENT: Negative for congestion.   Eyes: Negative for visual disturbance.  Respiratory: Negative for cough and  shortness of breath.   Cardiovascular: Negative for chest pain.  Gastrointestinal: Positive for abdominal pain, diarrhea, nausea and vomiting.  Genitourinary: Negative for dysuria and hematuria.  Musculoskeletal: Negative for back pain and neck pain.  Skin: Negative for rash.  Neurological: Positive for weakness (generalized). Negative for dizziness, numbness and headaches.  All other systems reviewed and are negative.   Physical Exam Updated Vital Signs BP 132/82 (BP Location: Right Arm)   Pulse 88   Temp 98 F (36.7 C) (Oral)   Resp 18   Ht _0  (1.651 m)   Wt 74.8 kg   SpO2 98%   BMI 27.46 kg/m   Physical Exam Vitals and nursing note reviewed.  Constitutional:      Appearance: Normal appearance. She is well-developed.  HENT:     Head: Normocephalic and atraumatic.  Eyes:     General: Lids are normal.     Conjunctiva/sclera: Conjunctivae normal.     Pupils: Pupils are equal, round, and reactive to light.  Cardiovascular:     Rate and Rhythm: Normal rate and regular rhythm.     Pulses: Normal pulses.     Heart sounds: Normal heart sounds. No murmur. No friction rub. No gallop.   Pulmonary:     Effort: Pulmonary effort is normal.     Breath sounds: Normal breath sounds.     Comments: Lungs clear to auscultation bilaterally.  Symmetric chest rise.  No wheezing, rales, rhonchi. Abdominal:     Palpations: Abdomen is soft. Abdomen is not rigid.     Tenderness: There is generalized abdominal tenderness. There is no guarding.     Comments: Abdomen is soft, nondistended.  Generalized tenderness no focal point.  No rigidity, guarding.  No CVA tenderness noted bilaterally.  Musculoskeletal:        General: Normal range of motion.     Cervical back: Full passive range of motion without pain.  Skin:    General: Skin is warm and dry.     Capillary Refill: Capillary refill takes less than 2 seconds.  Neurological:     Mental Status: She is alert and oriented to person, place,  and time.  Psychiatric:        Speech: Speech normal.     ED Results / Procedures / Treatments   Labs (all labs ordered are listed, but only abnormal results are displayed) Labs Reviewed  COMPREHENSIVE METABOLIC PANEL - Abnormal; Notable for the following components:      Result Value   Glucose, Bld 178 (*)    Total Protein 5.9 (*)    Albumin 2.4 (*)    All other components within normal limits  CBC - Abnormal; Notable for the following components:   WBC  11.2 (*)    RBC 5.71 (*)    Hemoglobin 15.2 (*)    HCT 46.2 (*)    All other components within normal limits  URINALYSIS, ROUTINE W REFLEX MICROSCOPIC - Abnormal; Notable for the following components:   APPearance CLOUDY (*)    Glucose, UA >=500 (*)    Hgb urine dipstick SMALL (*)    Protein, ur >=300 (*)    Leukocytes,Ua MODERATE (*)    Bacteria, UA FEW (*)    All other components within normal limits  CBG MONITORING, ED - Abnormal; Notable for the following components:   Glucose-Capillary 181 (*)    All other components within normal limits  SARS CORONAVIRUS 2 (TAT 6-24 HRS)  LIPASE, BLOOD  I-STAT BETA HCG BLOOD, ED ([facility], [facility], [facility] ONLY)  POC SARS CORONAVIRUS 2 AG -  ED    EKG None  Radiology CT Abdomen Pelvis W Contrast  Result Date: 10/27/2019 CLINICAL DATA:  Emesis nausea vomiting EXAM: CT ABDOMEN AND PELVIS WITH CONTRAST TECHNIQUE: Multidetector CT imaging of the abdomen and pelvis was performed using the standard protocol following bolus administration of intravenous contrast. CONTRAST:  122m OMNIPAQUE IOHEXOL 300 MG/ML  SOLN COMPARISON:  CT 03/03/2012 FINDINGS: Lower chest: Lung bases demonstrate no acute consolidation or effusion. The heart size is normal Hepatobiliary: No focal liver abnormality is seen. No gallstones, gallbladder wall thickening, or biliary dilatation. Pancreas: Unremarkable. No pancreatic ductal dilatation or surrounding inflammatory changes. Spleen: Normal in size without focal abnormality.  Adrenals/Urinary Tract: Adrenal glands are normal. Lobulated contour of right kidney with multifocal cortical defects, most likely scarring. No hydronephrosis. Slightly thick-walled urinary bladder Stomach/Bowel: Stomach is within normal limits. Appendix appears normal. No evidence of bowel wall thickening, distention, or inflammatory changes. Vascular/Lymphatic: Mild aortic atherosclerosis. No aneurysm. No worrisome adenopathy Reproductive: Uterus and bilateral adnexa are unremarkable. Other: Negative for free air or free fluid Musculoskeletal: No acute or significant osseous findings. IMPRESSION: 1. Slightly thick-walled appearance of the urinary bladder, question cystitis. 2. Otherwise no CT evidence for acute intra-abdominal or pelvic abnormality 3. Lobulated right kidney with multifocal cortical defects, consistent with scarring. Electronically Signed   By: KDonavan FoilM.D.   On: 10/27/2019 19:25    Procedures Procedures (including critical care time)  Medications Ordered in ED Medications  sodium chloride 0.9 % bolus 1,000 mL (0 mLs Intravenous Stopped 10/27/19 1514)  ondansetron (ZOFRAN) injection 4 mg (4 mg Intravenous Given 10/27/19 1340)  fentaNYL (SUBLIMAZE) injection 50 mcg (50 mcg Intravenous Given 10/27/19 1513)  iohexol (OMNIPAQUE) 300 MG/ML solution 100 mL (100 mLs Intravenous Contrast Given 10/27/19 1838)  cephALEXin (KEFLEX) capsule 500 mg (500 mg Oral Given 10/27/19 1941)    ED Course  I have reviewed the triage vital signs and the nursing notes.  Pertinent labs & imaging results that were available during my care of the patient were reviewed by me and considered in my medical decision making (see chart for details).    MDM Rules/Calculators/A&P                      44year old female who presents for evaluation of generalized abdominal pain, nausea/vomiting/diarrhea that began acutely at 3 AM this morning.  History of diabetes but states her sugars have been at current levels  and she has been compliant with her medications.  No fevers.  Had some burning in her throat that began after her vomiting that went into her chest.  Does not have any active chest pain  at this time.  On initial arrival, she is afebrile, nontoxic-appearing.  Vital signs are stable.  Consider viral process such as Covid versus intra-abdominal viral process.  Low suspicion for DKA but also a possibility.  Do not suspect ACS etiology.  Plan to check labs, give fluids and reassess.  I-STAT beta negative.  Initial Covid negative.  Lipase is unremarkable.  CBC shows slight leukocytosis of 11.2.  Hemoglobin stable.  CMP shows glucose of 178.  Normal BUN and creatinine.  Anion gap is 9.  Work-up is not consistent with DKA.  Urine shows moderate leukocytes, pyuria.  Reevaluation.  Patient still with some generalized abdominal tenderness.  She states pain has returned.  We will plan for CT on pelvis to ensure there is no acute intra-abdominal pathology.  CT on pelvis shows normal appendix.  There is slightly thick-walled appearance of the urinary bladder that could be consistent with cystitis.  This is consistent with her urine.  No other acute abnormalities.  Discussed results with patient.  Patient is resting comfortably.  Repeat abdominal exam is improved.  She has not had any vomiting and has been able to tolerate p.o.  She is hemodynamically stable.  We will plan to treat as UTI.  We will additionally do send out Covid test given her constellation of symptoms. At this time, patient exhibits no emergent life-threatening condition that require further evaluation in ED or admission. Patient had ample opportunity for questions and discussion. All patient's questions were answered with full understanding. Strict return precautions discussed. Patient expresses understanding and agreement to plan.   Stacey Lucas was evaluated in Emergency Department on 10/27/2019 for the symptoms described in the history of present  illness. She was evaluated in the context of the global COVID-19 pandemic, which necessitated consideration that the patient might be at risk for infection with the SARS-CoV-2 virus that causes COVID-19. Institutional protocols and algorithms that pertain to the evaluation of patients at risk for COVID-19 are in a state of rapid change based on information released by regulatory bodies including the CDC and federal and state organizations. These policies and algorithms were followed during the patient's care in the ED.  Portions of this note were generated with [employer]. Dictation errors may occur despite best attempts at proofreading.   Final Clinical Impression(s) / ED Diagnoses Final diagnoses:  Acute cystitis without hematuria    Rx / DC Orders ED Discharge Orders         Ordered    cephALEXin (KEFLEX) 500 MG capsule  4 times daily     10/27/19 1940           Desma Mcgregor 10/27/19 2134    Lucrezia Starch, MD 10/28/19 1515

## 2019-10-27 NOTE — ED Notes (Signed)
Pt resting in bed. Pt denies new or worsening complaints. Will continue to monitor. No distress noted. Pt on continuous monitoring via blood pressure, pulse ox, and cardiac monitor.  

## 2019-10-28 LAB — SARS CORONAVIRUS 2 (TAT 6-24 HRS): SARS Coronavirus 2: NEGATIVE

## 2019-11-04 ENCOUNTER — Ambulatory Visit: Payer: BC Managed Care – PPO | Attending: Family

## 2019-11-04 DIAGNOSIS — Z23 Encounter for immunization: Secondary | ICD-10-CM | POA: Insufficient documentation

## 2019-11-04 NOTE — Progress Notes (Signed)
   Covid-19 Vaccination Clinic  Name:  Stacey Lucas    MRN: [id number] DOB: [birth year]  11/04/2019  [name] was observed post Covid-19 immunization for 15 minutes without incidence. She was provided with Vaccine Information Sheet and instruction to access the V-Safe system.   [name] was instructed to call 911 with any severe reactions post vaccine: Marland Kitchen Difficulty breathing  . Swelling of your face and throat  . A fast heartbeat  . A bad rash all over your body  . Dizziness and weakness    Immunizations Administered    Name Date Dose VIS Date Route   Moderna COVID-19 Vaccine 11/04/2019  2:29 PM 0.5 mL 08/10/2019 Intramuscular   Manufacturer: Moderna   Lot: [id number]   NDC: 40005-056-78

## 2019-12-06 ENCOUNTER — Encounter (HOSPITAL_COMMUNITY): Payer: Self-pay | Admitting: *Deleted

## 2019-12-06 ENCOUNTER — Emergency Department (HOSPITAL_COMMUNITY): Payer: BC Managed Care – PPO

## 2019-12-06 ENCOUNTER — Emergency Department (HOSPITAL_COMMUNITY)
Admission: EM | Admit: 2019-12-06 | Discharge: 2019-12-06 | Disposition: A | Discharge status: Home / Self Care | Payer: BC Managed Care – PPO | Attending: Emergency Medicine | Admitting: Emergency Medicine

## 2019-12-06 ENCOUNTER — Other Ambulatory Visit: Payer: Self-pay

## 2019-12-06 DIAGNOSIS — M5441 Lumbago with sciatica, right side: Secondary | ICD-10-CM | POA: Diagnosis not present

## 2019-12-06 DIAGNOSIS — E119 Type 2 diabetes mellitus without complications: Secondary | ICD-10-CM | POA: Diagnosis not present

## 2019-12-06 DIAGNOSIS — F17210 Nicotine dependence, cigarettes, uncomplicated: Secondary | ICD-10-CM | POA: Insufficient documentation

## 2019-12-06 DIAGNOSIS — M545 Low back pain: Secondary | ICD-10-CM | POA: Diagnosis present

## 2019-12-06 DIAGNOSIS — Z7984 Long term (current) use of oral hypoglycemic drugs: Secondary | ICD-10-CM | POA: Insufficient documentation

## 2019-12-06 DIAGNOSIS — Z79899 Other long term (current) drug therapy: Secondary | ICD-10-CM | POA: Diagnosis not present

## 2019-12-06 LAB — CBG MONITORING, ED
Glucose-Capillary: 550 mg/dL (ref 70–99)
Glucose-Capillary: 224 mg/dL — ABNORMAL HIGH (ref 70–99)

## 2019-12-06 LAB — BASIC METABOLIC PANEL
Anion gap: 9 (ref 5–15)
BUN: 10 mg/dL (ref 6–20)
CO2: 23 mmol/L (ref 22–32)
Calcium: 8.4 mg/dL — ABNORMAL LOW (ref 8.9–10.3)
Chloride: 102 mmol/L (ref 98–111)
Creatinine, Ser: 0.88 mg/dL (ref 0.44–1.00)
GFR calc Af Amer: >60 mL/min (ref >60)
GFR calc non Af Amer: >60 mL/min (ref >60)
Glucose, Bld: 540 mg/dL (ref 70–99)
Potassium: 4.2 mmol/L (ref 3.5–5.1)
Sodium: 134 mmol/L — ABNORMAL LOW (ref 135–145)

## 2019-12-06 LAB — CBC
HCT: 42.4 % (ref 36.0–46.0)
Hemoglobin: 14.4 g/dL (ref 12.0–15.0)
MCH: 27.2 pg (ref 26.0–34.0)
MCHC: 34 g/dL (ref 30.0–36.0)
MCV: 80 fL (ref 80.0–100.0)
Platelets: 188 10*3/uL (ref 150–400)
RBC: 5.3 MIL/uL — ABNORMAL HIGH (ref 3.87–5.11)
RDW: 14.8 % (ref 11.5–15.5)
WBC: 8.1 10*3/uL (ref 4.0–10.5)
nRBC: 0 % (ref 0.0–0.2)

## 2019-12-06 MED ORDER — INSULIN ASPART 100 UNIT/ML ~~LOC~~ SOLN
10.0000 [IU] | Freq: Once | SUBCUTANEOUS | Status: AC
  Administered 2019-12-06: 15:00:00 10 [IU] via SUBCUTANEOUS

## 2019-12-06 MED ORDER — NAPROXEN 500 MG PO TABS: 500.0000 mg | ORAL_TABLET | Freq: Two times a day (BID) | ORAL | 0 refills | Status: DC

## 2019-12-06 MED ORDER — HYDROMORPHONE HCL 1 MG/ML IJ SOLN
1.0000 mg | Freq: Once | INTRAMUSCULAR | Status: AC
  Administered 2019-12-06: 1 mg via INTRAVENOUS
  Filled 2019-12-06: qty 1

## 2019-12-06 MED ORDER — CYCLOBENZAPRINE HCL 10 MG PO TABS: 10.0000 mg | ORAL_TABLET | Freq: Two times a day (BID) | ORAL | 0 refills | Status: DC | PRN

## 2019-12-06 MED ORDER — SODIUM CHLORIDE 0.9 % IV BOLUS (SEPSIS)
1000.0000 mL | Freq: Once | INTRAVENOUS | Status: AC
  Administered 2019-12-06: 1000 mL via INTRAVENOUS

## 2019-12-06 MED ORDER — SODIUM CHLORIDE 0.9 % IV SOLN
1000.0000 mL | INTRAVENOUS | Status: DC
  Administered 2019-12-06: 15:00:00 1000 mL via INTRAVENOUS

## 2019-12-06 NOTE — ED Provider Notes (Signed)
[facility] EMERGENCY DEPARTMENT Provider Note   CSN: [id number] Arrival date & time: 12/06/19  1144     History Chief Complaint  Patient presents with  . Back Pain    Stacey Lucas is a 44 y.o. female.  HPI   Patient presents to the ED with complaints of acute back pain.  Patient has had trouble with pain in her lower back rating down her leg.  However since yesterday she had acute onset of more severe pain.  Her leg feels weak and it is difficult for her to move.  She is having significant pain going down her back and leg.  Patient denies any numbness.  She did feel like her foot was mildly swollen.  No fevers or chills.  No chest pain or shortness of breath.  Past Medical History:  Diagnosis Date  . Diabetes mellitus without complication ([place])    Type II    Patient Active Problem List   Diagnosis Date Noted  . MDD (major depressive disorder), recurrent severe, without psychosis ([place]) 04/27/2018  . MDD (major depressive disorder), severe ([place]) 04/25/2018    Past Surgical History:  Procedure Laterality Date  . tubal       OB History   No obstetric history on file.     Family History  Problem Relation Age of Onset  . Diabetes Mother   . Hypertension Mother   . Arthritis Mother   . Diabetes Father   . Hypertension Father     Social History   Tobacco Use  . Smoking status: Current Every Day Smoker    Packs/day: 1.00    Types: Cigarettes  . Smokeless tobacco: Never Used  Substance Use Topics  . Alcohol use: Yes    Comment: occasionally  . Drug use: No    Home Medications Prior to Admission medications   Medication Sig Start Date End Date Taking? Authorizing Provider  amoxicillin (AMOXIL) 500 MG tablet TK 1 T PO Q 8 H TAT 05/25/19   [provider]  blood glucose meter kit and supplies KIT Check fasting blood sugar in the morning. 11/27/17   Jaynee Eagles, PA-C  cyclobenzaprine (FLEXERIL) 10 MG tablet Take 1 tablet (10 mg total)  by mouth 2 (two) times daily as needed for muscle spasms. 12/06/19   Dorie Rank, MD  gabapentin (NEURONTIN) 300 MG capsule Take 1 capsule (300 mg total) by mouth 3 (three) times daily. 11/05/18   Nat Christen, MD  ibuprofen (ADVIL) 600 MG tablet TK 1 T PO Q 6 TO 8 H PRN 05/26/19   [provider]  Insulin Glargine (BASAGLAR KWIKPEN) 100 UNIT/ML SOPN  06/14/19   [provider]  Insulin Pen Needle (PEN NEEDLES) 32G X 4 MM MISC 1 each by Does not apply route at bedtime. 01/30/18   Jaynee Eagles, PA-C  lisinopril (PRINIVIL,ZESTRIL) 5 MG tablet Take 1 tablet (5 mg total) by mouth daily. 04/27/18   Mordecai Maes, NP  metFORMIN (GLUCOPHAGE-XR) 500 MG 24 hr tablet Take 1 tablet (500 mg total) by mouth daily with breakfast. 04/27/18   Mordecai Maes, NP  naproxen (NAPROSYN) 500 MG tablet Take 1 tablet (500 mg total) by mouth 2 (two) times daily with a meal. As needed for pain 12/06/19   Dorie Rank, MD  OZEMPIC, 0.25 OR 0.5 MG/DOSE, 2 MG/1.5ML SOPN INJECT 0.25MG SQ WEEKLY 06/11/19   [provider]    Allergies    Patient has no known allergies.  Review of Systems  Review of Systems  All other systems reviewed and are negative.   Physical Exam Updated Vital Signs BP 125/84 (BP Location: Right Arm)   Pulse 75   Temp 98.5 F (36.9 C) (Oral)   Resp 17   Ht 1.651 m ('5\' 5"' )   Wt 72.6 kg   LMP 10/11/2019   SpO2 99%   BMI 26.63 kg/m   Physical Exam Vitals and nursing note reviewed.  Constitutional:      General: She is not in acute distress.    Appearance: She is well-developed.  HENT:     Head: Normocephalic and atraumatic.     Right Ear: External ear normal.     Left Ear: External ear normal.  Eyes:     General: No scleral icterus.       Right eye: No discharge.        Left eye: No discharge.     Conjunctiva/sclera: Conjunctivae normal.  Neck:     Trachea: No tracheal deviation.  Cardiovascular:     Rate and Rhythm: Normal rate.     Comments: Normal pulses  bilateral lower extremities Pulmonary:     Effort: Pulmonary effort is normal. No respiratory distress.     Breath sounds: No stridor.  Abdominal:     General: There is no distension.  Musculoskeletal:        General: No swelling or deformity.     Cervical back: Neck supple.     Lumbar back: Spasms present. No deformity. Decreased range of motion.     Comments: No calf edema, no calf tenderness  Skin:    General: Skin is warm and dry.     Findings: No rash.  Neurological:     Mental Status: She is alert.     Cranial Nerves: Cranial nerve deficit: no gross deficits.     Comments: Unable to lift right leg off the bed, normal plantar flexion, sensation intact     ED Results / Procedures / Treatments   Labs (all labs ordered are listed, but only abnormal results are displayed) Labs Reviewed  CBC - Abnormal; Notable for the following components:      Result Value   RBC 5.30 (*)    All other components within normal limits  BASIC METABOLIC PANEL - Abnormal; Notable for the following components:   Sodium 134 (*)    Glucose, Bld 540 (*)    Calcium 8.4 (*)    All other components within normal limits  CBG MONITORING, ED - Abnormal; Notable for the following components:   Glucose-Capillary 550 (*)    All other components within normal limits  CBG MONITORING, ED - Abnormal; Notable for the following components:   Glucose-Capillary 224 (*)    All other components within normal limits    EKG EKG Interpretation  Date/Time:  Monday December 06 2019 16:15:16 EDT Ventricular Rate:  81 PR Interval:    QRS Duration: 104 QT Interval:  423 QTC Calculation: 491 R Axis:   -23 Text Interpretation: Sinus rhythm Ventricular premature complex Consider left atrial enlargement Probable left ventricular hypertrophy Borderline prolonged QT interval atrial fibrillation not present Confirmed by Dorie Rank [phone]) on 12/06/2019 4:19:48 PM   Radiology MR LUMBAR SPINE WO CONTRAST  Result Date:  12/06/2019 CLINICAL DATA:  Low back pain with progressive neurological deficit. EXAM: MRI LUMBAR SPINE WITHOUT CONTRAST TECHNIQUE: Multiplanar, multisequence MR imaging of the lumbar spine was performed. No intravenous contrast was administered. COMPARISON:  CT scan of the abdomen and pelvis  dated 10/27/2019 and lumbar radiographs dated 07/09/2018 and chest x-ray dated 06/18/2018 FINDINGS: Segmentation: There are 5 lumbar type vertebra with S1 being lumbarized as well. Therefore, the last movable segment is at S1-2. There are 12 rib-bearing vertebra in the chest. Alignment:  Physiologic. Vertebrae:  No fracture, evidence of discitis, or bone lesion. Conus medullaris and cauda equina: Conus extends to the L1-2 level. Conus and cauda equina appear normal. Paraspinal and other soft tissues: Bladder is distended to the level of the umbilicus. Otherwise normal. Disc levels: The discs are normal from T11-12 through L5-S1. There are minimal degenerative changes of the facet joints at L4-5 and L5-S1 with no neural impingement or spinal stenosis. S1-2: There small focal disc bulges into both neural foramina without neural impingement. Otherwise normal. IMPRESSION: 1. No acute abnormality of the lumbar spine. 2. Minimal degenerative changes of the facet joints at L4-5 and L5-S1. 3. Transitional lumbosacral anatomy with S1 being lumbarized as well as 12 rib-bearing vertebra in the chest. Electronically Signed   By: Lorriane Shire M.D.   On: 12/06/2019 16:04    Procedures Procedures (including critical care time)  Medications Ordered in ED Medications  sodium chloride 0.9 % bolus 1,000 mL (0 mLs Intravenous Stopped 12/06/19 1610)    Followed by  0.9 %  sodium chloride infusion (1,000 mLs Intravenous New Bag/Given 12/06/19 1438)  HYDROmorphone (DILAUDID) injection 1 mg (1 mg Intravenous Given 12/06/19 1412)  insulin aspart (novoLOG) injection 10 Units (10 Units Subcutaneous Given 12/06/19 1435)    ED Course  I have  reviewed the triage vital signs and the nursing notes.  Pertinent labs & imaging results that were available during my care of the patient were reviewed by me and considered in my medical decision making (see chart for details).  Clinical Course as of Dec 05 1641  Mon Dec 06, 2019  1615 IMPRESSION: 1. No acute abnormality of the lumbar spine. 2. Minimal degenerative changes of the facet joints at L4-5 and L5-S1. 3. Transitional lumbosacral anatomy with S1 being lumbarized as well as 12 rib-bearing vertebra in the chest.   [JK]  1619 Repeat ECG normal.  Question whether the first EKG was even from this patient.  She has never been tachycardic or has had any symptoms   [JK]    Clinical Course User Index [JK] Dorie Rank, MD   MDM Rules/Calculators/A&P                      Sx suggestive of sciatica.  Normal pulses. MRI without significant findings.  Labs notable for hyperglycemia without dka. CBG improved with fluid and insulin.  Pt encouraged to take her meds.  Pain improved with treatment.   Final Clinical Impression(s) / ED Diagnoses Final diagnoses:  Acute right-sided low back pain with right-sided sciatica    Rx / DC Orders ED Discharge Orders         Ordered    cyclobenzaprine (FLEXERIL) 10 MG tablet  2 times daily PRN     12/06/19 1642    naproxen (NAPROSYN) 500 MG tablet  2 times daily with meals     12/06/19 1642           Dorie Rank, MD 12/06/19 1644

## 2019-12-06 NOTE — ED Notes (Signed)
Pt discharge instructions and prescriptions reviewed with the patient. The patient verbalized understanding of both. Pt discharged. 

## 2019-12-06 NOTE — ED Triage Notes (Signed)
Pt reports hx of sciatica in past but sudden onset of lower back pain yesterday, no injury. Has weakness to right leg, causing difficulty ambulating.

## 2019-12-06 NOTE — Discharge Instructions (Addendum)
Follow up with your primary care doctor.  Take the medications as needed for pain and discomfort.

## 2019-12-07 ENCOUNTER — Ambulatory Visit: Payer: BC Managed Care – PPO | Attending: Internal Medicine

## 2019-12-23 ENCOUNTER — Ambulatory Visit: Payer: BC Managed Care – PPO | Attending: Family

## 2019-12-23 DIAGNOSIS — Z23 Encounter for immunization: Secondary | ICD-10-CM

## 2019-12-23 NOTE — Progress Notes (Signed)
   Covid-19 Vaccination Clinic  Name:  Stacey Lucas    MRN: [id number] DOB: [birth year]  12/23/2019  Stacey Lucas was observed post Covid-19 immunization for 15 minutes without incident. She was provided with Vaccine Information Sheet and instruction to access the V-Safe system.   Stacey Lucas was instructed to call 911 with any severe reactions post vaccine: Marland Kitchen Difficulty breathing  . Swelling of face and throat  . A fast heartbeat  . A bad rash all over body  . Dizziness and weakness   Immunizations Administered    Name Date Dose VIS Date Route   Moderna COVID-19 Vaccine 12/23/2019 10:15 AM 0.5 mL 08/10/2019 Intramuscular   Manufacturer: Moderna   Lot: [id number]   NDC: 42395-320-23

## 2020-04-14 ENCOUNTER — Other Ambulatory Visit: Payer: Self-pay

## 2020-04-14 ENCOUNTER — Emergency Department (HOSPITAL_COMMUNITY): Payer: BC Managed Care – PPO

## 2020-04-14 ENCOUNTER — Encounter (HOSPITAL_COMMUNITY): Payer: Self-pay | Admitting: Emergency Medicine

## 2020-04-14 ENCOUNTER — Emergency Department (HOSPITAL_COMMUNITY)
Admission: EM | Admit: 2020-04-14 | Discharge: 2020-04-14 | Disposition: A | Discharge status: Home / Self Care | Payer: BC Managed Care – PPO | Attending: Emergency Medicine | Admitting: Emergency Medicine

## 2020-04-14 DIAGNOSIS — R110 Nausea: Secondary | ICD-10-CM | POA: Insufficient documentation

## 2020-04-14 DIAGNOSIS — H53149 Visual discomfort, unspecified: Secondary | ICD-10-CM | POA: Insufficient documentation

## 2020-04-14 DIAGNOSIS — H538 Other visual disturbances: Secondary | ICD-10-CM | POA: Diagnosis not present

## 2020-04-14 DIAGNOSIS — M542 Cervicalgia: Secondary | ICD-10-CM | POA: Diagnosis not present

## 2020-04-14 DIAGNOSIS — Z79899 Other long term (current) drug therapy: Secondary | ICD-10-CM | POA: Insufficient documentation

## 2020-04-14 DIAGNOSIS — F17210 Nicotine dependence, cigarettes, uncomplicated: Secondary | ICD-10-CM | POA: Diagnosis not present

## 2020-04-14 DIAGNOSIS — R42 Dizziness and giddiness: Secondary | ICD-10-CM | POA: Insufficient documentation

## 2020-04-14 DIAGNOSIS — R202 Paresthesia of skin: Secondary | ICD-10-CM | POA: Insufficient documentation

## 2020-04-14 DIAGNOSIS — R519 Headache, unspecified: Secondary | ICD-10-CM | POA: Diagnosis not present

## 2020-04-14 DIAGNOSIS — G43001 Migraine without aura, not intractable, with status migrainosus: Secondary | ICD-10-CM

## 2020-04-14 DIAGNOSIS — E119 Type 2 diabetes mellitus without complications: Secondary | ICD-10-CM | POA: Diagnosis not present

## 2020-04-14 DIAGNOSIS — Z794 Long term (current) use of insulin: Secondary | ICD-10-CM | POA: Insufficient documentation

## 2020-04-14 LAB — CBC WITH DIFFERENTIAL/PLATELET
Abs Immature Granulocytes: 0.01 10*3/uL (ref 0.00–0.07)
Basophils Absolute: 0.1 10*3/uL (ref 0.0–0.1)
Basophils Relative: 1 %
Eosinophils Absolute: 0.2 10*3/uL (ref 0.0–0.5)
Eosinophils Relative: 4 %
HCT: 46.3 % — ABNORMAL HIGH (ref 36.0–46.0)
Hemoglobin: 15.1 g/dL — ABNORMAL HIGH (ref 12.0–15.0)
Immature Granulocytes: 0 %
Lymphocytes Relative: 45 %
Lymphs Abs: 2.9 10*3/uL (ref 0.7–4.0)
MCH: 26.6 pg (ref 26.0–34.0)
MCHC: 32.6 g/dL (ref 30.0–36.0)
MCV: 81.7 fL (ref 80.0–100.0)
Monocytes Absolute: 0.5 10*3/uL (ref 0.1–1.0)
Monocytes Relative: 8 %
Neutro Abs: 2.8 10*3/uL (ref 1.7–7.7)
Neutrophils Relative %: 42 %
Platelets: 250 10*3/uL (ref 150–400)
RBC: 5.67 MIL/uL — ABNORMAL HIGH (ref 3.87–5.11)
RDW: 15.6 % — ABNORMAL HIGH (ref 11.5–15.5)
WBC: 6.5 10*3/uL (ref 4.0–10.5)
nRBC: 0 % (ref 0.0–0.2)

## 2020-04-14 LAB — BASIC METABOLIC PANEL
Anion gap: 10 (ref 5–15)
BUN: 10 mg/dL (ref 6–20)
CO2: 21 mmol/L — ABNORMAL LOW (ref 22–32)
Calcium: 8.2 mg/dL — ABNORMAL LOW (ref 8.9–10.3)
Chloride: 107 mmol/L (ref 98–111)
Creatinine, Ser: 0.89 mg/dL (ref 0.44–1.00)
GFR calc Af Amer: >60 mL/min (ref >60)
GFR calc non Af Amer: >60 mL/min (ref >60)
Glucose, Bld: 76 mg/dL (ref 70–99)
Potassium: 3.7 mmol/L (ref 3.5–5.1)
Sodium: 138 mmol/L (ref 135–145)

## 2020-04-14 LAB — CBG MONITORING, ED: Glucose-Capillary: 48 mg/dL — ABNORMAL LOW (ref 70–99)

## 2020-04-14 LAB — I-STAT BETA HCG BLOOD, ED (MC, WL, AP ONLY): I-stat hCG, quantitative: <5 m[IU]/mL (ref <5)

## 2020-04-14 MED ORDER — SODIUM CHLORIDE 0.9 % IV BOLUS
1000.0000 mL | Freq: Once | INTRAVENOUS | Status: AC
  Administered 2020-04-14: 1000 mL via INTRAVENOUS

## 2020-04-14 MED ORDER — OXYCODONE-ACETAMINOPHEN 5-325 MG PO TABS: 1.0000 | ORAL_TABLET | ORAL | 0 refills | Status: DC | PRN

## 2020-04-14 MED ORDER — KETOROLAC TROMETHAMINE 15 MG/ML IJ SOLN
15.0000 mg | Freq: Once | INTRAMUSCULAR | Status: AC
  Administered 2020-04-14: 15 mg via INTRAVENOUS
  Filled 2020-04-14: qty 1

## 2020-04-14 MED ORDER — OXYCODONE-ACETAMINOPHEN 5-325 MG PO TABS
1.0000 | ORAL_TABLET | ORAL | Status: AC | PRN
  Administered 2020-04-14 (×2): 1 via ORAL
  Filled 2020-04-14 (×2): qty 1

## 2020-04-14 MED ORDER — GADOBUTROL 1 MMOL/ML IV SOLN
7.5000 mL | Freq: Once | INTRAVENOUS | Status: AC | PRN
  Administered 2020-04-14: 7.5 mL via INTRAVENOUS

## 2020-04-14 MED ORDER — METOCLOPRAMIDE HCL 5 MG/ML IJ SOLN
10.0000 mg | Freq: Once | INTRAMUSCULAR | Status: AC
  Administered 2020-04-14: 10 mg via INTRAVENOUS
  Filled 2020-04-14: qty 2

## 2020-04-14 MED ORDER — DIPHENHYDRAMINE HCL 50 MG/ML IJ SOLN
25.0000 mg | Freq: Once | INTRAMUSCULAR | Status: AC
  Administered 2020-04-14: 25 mg via INTRAVENOUS
  Filled 2020-04-14: qty 1

## 2020-04-14 NOTE — ED Provider Notes (Signed)
[facility] EMERGENCY DEPARTMENT Provider Note   CSN: [id number] Arrival date & time: 04/14/20  1025     History Chief Complaint  Patient presents with  . Headache    Stacey Lucas is a 44 y.o. female.  HPI 44 year old female presents with headache. Overall the headache has been ongoing for about 2 weeks. Initially headache was on and off. Started in the back of her head. Over this last week or so it has been more constant. It is now diffuse. Sometimes the headache is worst over her left frontal region and behind her left eye. She has bilateral blurry vision. No neck stiffness though the occipital pain does go into her neck. No fevers. Has had nausea without vomiting. Has tried tylenol, imitrex without relief. No weakness, but when asked about numbness she's had right arm and right leg numbness for "a while". Seems to at least be for weeks. Headache is currently rated as severe. She has felt dizzy and lightheaded and has had some photophobia.    Past Medical History:  Diagnosis Date  . Diabetes mellitus without complication ([place])    Type II    Patient Active Problem List   Diagnosis Date Noted  . MDD (major depressive disorder), recurrent severe, without psychosis ([place]) 04/27/2018  . MDD (major depressive disorder), severe ([place]) 04/25/2018    Past Surgical History:  Procedure Laterality Date  . tubal       OB History   No obstetric history on file.     Family History  Problem Relation Age of Onset  . Diabetes Mother   . Hypertension Mother   . Arthritis Mother   . Diabetes Father   . Hypertension Father     Social History   Tobacco Use  . Smoking status: Current Every Day Smoker    Packs/day: 1.00    Types: Cigarettes  . Smokeless tobacco: Never Used  Vaping Use  . Vaping Use: Never used  Substance Use Topics  . Alcohol use: Yes    Comment: occasionally  . Drug use: No    Home Medications Prior to Admission medications   Medication  Sig Start Date End Date Taking? Authorizing Provider  amoxicillin (AMOXIL) 500 MG tablet TK 1 T PO Q 8 H TAT 05/25/19   [provider]  blood glucose meter kit and supplies KIT Check fasting blood sugar in the morning. 11/27/17   Jaynee Eagles, PA-C  cyclobenzaprine (FLEXERIL) 10 MG tablet Take 1 tablet (10 mg total) by mouth 2 (two) times daily as needed for muscle spasms. 12/06/19   Dorie Rank, MD  gabapentin (NEURONTIN) 300 MG capsule Take 1 capsule (300 mg total) by mouth 3 (three) times daily. 11/05/18   Nat Christen, MD  ibuprofen (ADVIL) 600 MG tablet TK 1 T PO Q 6 TO 8 H PRN 05/26/19   [provider]  Insulin Glargine (BASAGLAR KWIKPEN) 100 UNIT/ML SOPN  06/14/19   [provider]  Insulin Pen Needle (PEN NEEDLES) 32G X 4 MM MISC 1 each by Does not apply route at bedtime. 01/30/18   Jaynee Eagles, PA-C  lisinopril (PRINIVIL,ZESTRIL) 5 MG tablet Take 1 tablet (5 mg total) by mouth daily. 04/27/18   Mordecai Maes, NP  metFORMIN (GLUCOPHAGE-XR) 500 MG 24 hr tablet Take 1 tablet (500 mg total) by mouth daily with breakfast. 04/27/18   Mordecai Maes, NP  naproxen (NAPROSYN) 500 MG tablet Take 1 tablet (500 mg total) by mouth 2 (two) times daily with a meal.  As needed for pain 12/06/19   Dorie Rank, MD  OZEMPIC, 0.25 OR 0.5 MG/DOSE, 2 MG/1.5ML SOPN INJECT 0.25MG SQ WEEKLY 06/11/19   [provider]    Allergies    Patient has no known allergies.  Review of Systems   Review of Systems  Constitutional: Negative for fever.  Eyes: Positive for photophobia and visual disturbance.  Gastrointestinal: Positive for nausea. Negative for vomiting.  Musculoskeletal: Positive for neck pain. Negative for neck stiffness.  Neurological: Positive for dizziness, light-headedness, numbness and headaches. Negative for weakness.  All other systems reviewed and are negative.   Physical Exam Updated Vital Signs BP (!) 163/100 (BP Location: Right Arm)   Pulse (!) 102   Temp 98.9  F (37.2 C) (Oral)   Resp 18   Ht '5\' 6"'  (1.676 m)   Wt 74.8 kg   SpO2 100%   BMI 26.63 kg/m   Physical Exam Vitals and nursing note reviewed.  Constitutional:      General: She is not in acute distress.    Appearance: She is well-developed. She is not ill-appearing.  HENT:     Head: Normocephalic and atraumatic.     Comments: Mild left frontal tenderness. No other tenderness to scalp/head    Right Ear: External ear normal.     Left Ear: External ear normal.     Nose: Nose normal.  Eyes:     General:        Right eye: No discharge.        Left eye: No discharge.     Extraocular Movements: Extraocular movements intact.     Pupils: Pupils are equal, round, and reactive to light.  Cardiovascular:     Rate and Rhythm: Normal rate and regular rhythm.     Heart sounds: Normal heart sounds.  Pulmonary:     Effort: Pulmonary effort is normal.     Breath sounds: Normal breath sounds.  Abdominal:     Palpations: Abdomen is soft.     Tenderness: There is no abdominal tenderness.  Musculoskeletal:     Cervical back: Normal range of motion and neck supple. No rigidity.  Skin:    General: Skin is warm and dry.  Neurological:     Mental Status: She is alert.     Comments: CN 3-12 grossly intact. 5/5 strength in all 4 extremities. Grossly normal sensation  In face and left extremities, subjective decreased in right extremities. Normal finger to nose.   Psychiatric:        Mood and Affect: Mood is not anxious.     ED Results / Procedures / Treatments   Labs (all labs ordered are listed, but only abnormal results are displayed) Labs Reviewed  BASIC METABOLIC PANEL - Abnormal; Notable for the following components:      Result Value   CO2 21 (*)    Calcium 8.2 (*)    All other components within normal limits  CBC WITH DIFFERENTIAL/PLATELET - Abnormal; Notable for the following components:   RBC 5.67 (*)    Hemoglobin 15.1 (*)    HCT 46.3 (*)    RDW 15.6 (*)    All other  components within normal limits  I-STAT BETA HCG BLOOD, ED ([facility], [facility], [facility] ONLY)  CBG MONITORING, ED    EKG None  Radiology No results found.  Procedures Procedures (including critical care time)  Medications Ordered in ED Medications  oxyCODONE-acetaminophen (PERCOCET/ROXICET) 5-325 MG per tablet 1 tablet (1 tablet Oral Given 04/14/20 1105)  sodium chloride 0.9 % bolus 1,000 mL (has no administration in time range)  metoCLOPramide (REGLAN) injection 10 mg (10 mg Intravenous Given 04/14/20 1357)  diphenhydrAMINE (BENADRYL) injection 25 mg (25 mg Intravenous Given 04/14/20 1359)  ketorolac (TORADOL) 15 MG/ML injection 15 mg (15 mg Intravenous Given 04/14/20 1401)  gadobutrol (GADAVIST) 1 MMOL/ML injection 7.5 mL (7.5 mLs Intravenous Contrast Given 04/14/20 1531)    ED Course  I have reviewed the triage vital signs and the nursing notes.  Pertinent labs & imaging results that were available during my care of the patient were reviewed by me and considered in my medical decision making (see chart for details).    MDM Rules/Calculators/A&P                          Patient has some vague numbness to her right extremities.  While I do not think this stroke, I think she needs MRI to help rule out mass, stroke, MS.  Given the vision changes we will also rule out venous thrombosis with MRV.  Given treatment reglan, benadryl, fluids and toradol. Labs are reassuring. Very low suspicion of meningitis/encephalitis. MRI pending when care transferred to Dr. Gilford Raid. Final Clinical Impression(s) / ED Diagnoses Final diagnoses:  None    Rx / DC Orders ED Discharge Orders    None       Sherwood Gambler, MD 04/14/20 [phone]

## 2020-04-14 NOTE — ED Provider Notes (Signed)
Pt signed out by Dr. Criss Alvine pending MRI/MRV.  IMPRESSION: 1. No acute intracranial abnormality. 2. No evidence of venous sinus thrombosis. 3. Scattered supratentorial white matter T2 hyperintensities. Differential includes sequela of migraines, post infectious/inflammatory sequela and demyelination.  Scan reviewed with Dr. Amada Jupiter (neurology) who recommended treatment with migraine cocktail.  Pt's pain is better and she is feeling better.  Pt is stable for d/c.  Return if worse.     Jacalyn Lefevre, MD 04/14/20 [phone]

## 2020-04-14 NOTE — ED Triage Notes (Addendum)
Pt reports headache x2 weeks, has been taking tylenol and meds from her doctor whom she has done virtual visits with (imitrex, muscle relaxer, and toradol) reports headache is frontal and her head feels heavy. Endorses blurred vision with pain more behind L eye. Reports upon waking this morning she had pain to posterior head-pain was worse than it has been. BP elevated, pt denies hx of htn. A/ox4, speech clear.

## 2020-04-14 NOTE — ED Notes (Signed)
Pt reports moderate relief after percocet. Pain returned at this time.

## 2020-04-17 ENCOUNTER — Encounter: Payer: Self-pay | Admitting: Neurology

## 2020-07-06 NOTE — Progress Notes (Deleted)
NEUROLOGY CONSULTATION NOTE  Stacey Lucas MRN: [id number] DOB: May 28, 1976  Referring provider: Rachell Cipro, MD Primary care provider: Rachell Cipro, MD  Reason for consult:  migraines  HISTORY OF PRESENT ILLNESS: Stacey Lucas is a 44 year old ***-handed female with type 2 diabetes who presents for migraines.  History supplemented by ED note.  ***.  Due to increased intensity and persistence, she went to the [facility] ED on 04/14/2020.  MRI brain w/wo and MRV of head personally reviewed and demonstrated scattered cerebral white matter changes but no mass lesion, bleed, stroke or venous sinus thrombosis.  Current NSAIDS/analgesics:  *** Current triptans:  *** Current ergotamine:  *** Current anti-emetic:  *** Current muscle relaxants:  *** Current Antihypertensive medications:  *** Current Antidepressant medications:  *** Current Anticonvulsant medications:  *** Current anti-CGRP:  *** Current Vitamins/Herbal/Supplements:  *** Current Antihistamines/Decongestants:  *** Other therapy:  *** Hormone/birth control:  *** Other medications:  ***  Past NSAIDS/analgesics:  *** Past abortive triptans:  *** Past abortive ergotamine:  *** Past muscle relaxants:  *** Past anti-emetic:  *** Past antihypertensive medications:  *** Past antidepressant medications:  *** Past anticonvulsant medications:  *** Past anti-CGRP:  *** Past vitamins/Herbal/Supplements:  *** Past antihistamines/decongestants:  *** Other past therapies:  ***  Caffeine:  *** Alcohol:  *** Smoker:  *** Diet:  *** Exercise:  *** Depression:  ***; Anxiety:  *** Other pain:  *** Sleep hygiene:  *** Family history of headache:  ***   PAST MEDICAL HISTORY: Past Medical History:  Diagnosis Date  . Diabetes mellitus without complication ([place])    Type II    PAST SURGICAL HISTORY: Past Surgical History:  Procedure Laterality Date  . tubal      MEDICATIONS: Current Outpatient Medications on File  Prior to Visit  Medication Sig Dispense Refill  . amoxicillin (AMOXIL) 500 MG tablet TK 1 T PO Q 8 H TAT    . blood glucose meter kit and supplies KIT Check fasting blood sugar in the morning. 1 each 0  . cyclobenzaprine (FLEXERIL) 10 MG tablet Take 1 tablet (10 mg total) by mouth 2 (two) times daily as needed for muscle spasms. 20 tablet 0  . gabapentin (NEURONTIN) 300 MG capsule Take 1 capsule (300 mg total) by mouth 3 (three) times daily. 90 capsule 0  . ibuprofen (ADVIL) 600 MG tablet TK 1 T PO Q 6 TO 8 H PRN    . Insulin Glargine (BASAGLAR KWIKPEN) 100 UNIT/ML SOPN     . Insulin Pen Needle (PEN NEEDLES) 32G X 4 MM MISC 1 each by Does not apply route at bedtime. 100 each 3  . lisinopril (PRINIVIL,ZESTRIL) 5 MG tablet Take 1 tablet (5 mg total) by mouth daily. 30 tablet 0  . metFORMIN (GLUCOPHAGE-XR) 500 MG 24 hr tablet Take 1 tablet (500 mg total) by mouth daily with breakfast. 30 tablet 0  . naproxen (NAPROSYN) 500 MG tablet Take 1 tablet (500 mg total) by mouth 2 (two) times daily with a meal. As needed for pain 20 tablet 0  . oxyCODONE-acetaminophen (PERCOCET/ROXICET) 5-325 MG tablet Take 1 tablet by mouth every 4 (four) hours as needed for severe pain. 10 tablet 0  . OZEMPIC, 0.25 OR 0.5 MG/DOSE, 2 MG/1.5ML SOPN INJECT 0.25MG SQ WEEKLY     No current facility-administered medications on file prior to visit.    ALLERGIES: No Known Allergies  FAMILY HISTORY: Family History  Problem Relation Age of Onset  . Diabetes Mother   .  Hypertension Mother   . Arthritis Mother   . Diabetes Father   . Hypertension Father    ***.  SOCIAL HISTORY: Social History   Socioeconomic History  . Marital status: Single    Spouse name: Not on file  . Number of children: Not on file  . Years of education: Not on file  . Highest education level: Not on file  Occupational History  . Not on file  Tobacco Use  . Smoking status: Current Every Day Smoker    Packs/day: 1.00    Types: Cigarettes    . Smokeless tobacco: Never Used  Vaping Use  . Vaping Use: Never used  Substance and Sexual Activity  . Alcohol use: Yes    Comment: occasionally  . Drug use: No  . Sexual activity: Yes    Birth control/protection: None  Other Topics Concern  . Not on file  Social History Narrative  . Not on file   Social Determinants of Health   Financial Resource Strain:   . Difficulty of Paying Living Expenses: Not on file  Food Insecurity:   . Worried About Charity fundraiser in the Last Year: Not on file  . Ran Out of Food in the Last Year: Not on file  Transportation Needs:   . Lack of Transportation (Medical): Not on file  . Lack of Transportation (Non-Medical): Not on file  Physical Activity:   . Days of Exercise per Week: Not on file  . Minutes of Exercise per Session: Not on file  Stress:   . Feeling of Stress : Not on file  Social Connections:   . Frequency of Communication with Friends and Family: Not on file  . Frequency of Social Gatherings with Friends and Family: Not on file  . Attends Religious Services: Not on file  . Active Member of Clubs or Organizations: Not on file  . Attends Archivist Meetings: Not on file  . Marital Status: Not on file  Intimate Partner Violence:   . Fear of Current or Ex-Partner: Not on file  . Emotionally Abused: Not on file  . Physically Abused: Not on file  . Sexually Abused: Not on file    REVIEW OF SYSTEMS: Constitutional: No fevers, chills, or sweats, no generalized fatigue, change in appetite Eyes: No visual changes, double vision, eye pain Ear, nose and throat: No hearing loss, ear pain, nasal congestion, sore throat Cardiovascular: No chest pain, palpitations Respiratory:  No shortness of breath at rest or with exertion, wheezes GastrointestinaI: No nausea, vomiting, diarrhea, abdominal pain, fecal incontinence Genitourinary:  No dysuria, urinary retention or frequency Musculoskeletal:  No neck pain, back  pain Integumentary: No rash, pruritus, skin lesions Neurological: as above Psychiatric: No depression, insomnia, anxiety Endocrine: No palpitations, fatigue, diaphoresis, mood swings, change in appetite, change in weight, increased thirst Hematologic/Lymphatic:  No purpura, petechiae. Allergic/Immunologic: no itchy/runny eyes, nasal congestion, recent allergic reactions, rashes  PHYSICAL EXAM: *** General: No acute distress.  Patient appears ***-groomed.  *** Head:  Normocephalic/atraumatic Eyes:  fundi examined but not visualized Neck: supple, no paraspinal tenderness, full range of motion Back: No paraspinal tenderness Heart: regular rate and rhythm Lungs: Clear to auscultation bilaterally. Vascular: No carotid bruits. Neurological Exam: Mental status: alert and oriented to person, place, and time, recent and remote memory intact, fund of knowledge intact, attention and concentration intact, speech fluent and not dysarthric, language intact. Cranial nerves: CN I: not tested CN II: pupils equal, round and reactive to light, visual fields  intact CN III, IV, VI:  full range of motion, no nystagmus, no ptosis CN V: facial sensation intact CN VII: upper and lower face symmetric CN VIII: hearing intact CN IX, X: gag intact, uvula midline CN XI: sternocleidomastoid and trapezius muscles intact CN XII: tongue midline Bulk & Tone: normal, no fasciculations. Motor:  5/5 throughout *** Sensation:  Pinprick *** temperature *** and vibration sensation intact.  ***. Deep Tendon Reflexes:  2+ throughout, *** toes downgoing.  *** Finger to nose testing:  Without dysmetria.  *** Heel to shin:  Without dysmetria.  *** Gait:  Normal station and stride.  Able to turn and tandem walk. Romberg ***.  IMPRESSION: ***  PLAN: ***  Thank you for allowing me to take part in the care of this patient.  Metta Clines, DO  CC: ***

## 2020-07-07 ENCOUNTER — Ambulatory Visit: Payer: BC Managed Care – PPO | Admitting: Neurology

## 2020-10-25 ENCOUNTER — Encounter: Payer: Self-pay | Admitting: *Deleted

## 2020-10-30 ENCOUNTER — Encounter: Payer: Self-pay | Admitting: Diagnostic Neuroimaging

## 2020-10-30 ENCOUNTER — Ambulatory Visit: Payer: BC Managed Care – PPO | Admitting: Diagnostic Neuroimaging

## 2020-10-30 ENCOUNTER — Other Ambulatory Visit: Payer: Self-pay

## 2020-10-30 VITALS — BP 128/75 | HR 100 | Ht 65.0 in | Wt 159.2 lb

## 2020-10-30 DIAGNOSIS — R29898 Other symptoms and signs involving the musculoskeletal system: Secondary | ICD-10-CM | POA: Diagnosis not present

## 2020-10-30 DIAGNOSIS — M25561 Pain in right knee: Secondary | ICD-10-CM | POA: Diagnosis not present

## 2020-10-30 DIAGNOSIS — G8929 Other chronic pain: Secondary | ICD-10-CM

## 2020-10-30 NOTE — Patient Instructions (Signed)
RIGHT LEG WEAKNESS (due to right knee arthritis + right lumbar radiculopathy + neuropathy) - check B12, A1c, TSH, CK, EMG/NCS - consider orthopedic / sports med referral for right knee pain; may consider PT evaluation

## 2020-10-30 NOTE — Progress Notes (Signed)
[facility]  PATIENT: Stacey Lucas DOB: June 24, 1976  REFERRING CLINICIAN: Luanne Bras, FNP HISTORY FROM: patient  REASON FOR VISIT: new consult    HISTORICAL  CHIEF COMPLAINT:  Chief Complaint  Patient presents with  . RLE neuropathy    Rm 6 New Pt "bad neuropathy in my right foot, has worsened in past 6 months; my knee with give out, have fallen twice; about 9 days ago my eyes got blurry then it looked like fireworks were going off "      HISTORY OF PRESENT ILLNESS:   45 year old female with diabetes here for evaluation of right leg pain and weakness.  For past 1 year patient has had intermittent progressive right knee pain, right knee weakness, pain in the right lower back, right hip and right foot.  She has numbness in her right foot.  She has mild symptoms in the left side but not as bad as the right.  Her right leg is giving out several times and she is fallen down several times.  She had MRI of the lumbar spine which showed no major findings.  Also had MRI of the brain which was unremarkable.   REVIEW OF SYSTEMS: Full 14 system review of systems performed and negative with exception of: As per HPI.  ALLERGIES: No Known Allergies  HOME MEDICATIONS: Outpatient Medications Prior to Visit  Medication Sig Dispense Refill  . blood glucose meter kit and supplies KIT Check fasting blood sugar in the morning. 1 each 0  . Insulin Glargine (BASAGLAR KWIKPEN) 100 UNIT/ML SOPN     . Insulin Pen Needle (PEN NEEDLES) 32G X 4 MM MISC 1 each by Does not apply route at bedtime. 100 each 3  . OZEMPIC, 0.25 OR 0.5 MG/DOSE, 2 MG/1.5ML SOPN INJECT 0.25MG SQ WEEKLY    . gabapentin (NEURONTIN) 300 MG capsule Take 1 capsule (300 mg total) by mouth 3 (three) times daily. (Patient not taking: Reported on 10/30/2020) 90 capsule 0  . lisinopril (PRINIVIL,ZESTRIL) 5 MG tablet Take 1 tablet (5 mg total) by mouth daily. (Patient not taking: Reported on 10/30/2020) 30 tablet 0  .  amoxicillin (AMOXIL) 500 MG tablet TK 1 T PO Q 8 H TAT    . cyclobenzaprine (FLEXERIL) 10 MG tablet Take 1 tablet (10 mg total) by mouth 2 (two) times daily as needed for muscle spasms. 20 tablet 0  . ibuprofen (ADVIL) 600 MG tablet TK 1 T PO Q 6 TO 8 H PRN    . metFORMIN (GLUCOPHAGE-XR) 500 MG 24 hr tablet Take 1 tablet (500 mg total) by mouth daily with breakfast. 30 tablet 0  . naproxen (NAPROSYN) 500 MG tablet Take 1 tablet (500 mg total) by mouth 2 (two) times daily with a meal. As needed for pain 20 tablet 0  . oxyCODONE-acetaminophen (PERCOCET/ROXICET) 5-325 MG tablet Take 1 tablet by mouth every 4 (four) hours as needed for severe pain. 10 tablet 0   No facility-administered medications prior to visit.    PAST MEDICAL HISTORY: Past Medical History:  Diagnosis Date  . Anxiety and depression   . Diabetes mellitus without complication ([place])    Type II  . GERD (gastroesophageal reflux disease)   . Hyperlipidemia   . Hypertension   . Impingement syndrome of right shoulder   . Migraine   . Pyelonephritis   . Sciatica     PAST SURGICAL HISTORY: Past Surgical History:  Procedure Laterality Date  . tubal      FAMILY HISTORY: Family  History  Problem Relation Age of Onset  . Diabetes Mother   . Hypertension Mother   . Arthritis Mother   . Diabetes Father   . Hypertension Father     SOCIAL HISTORY: Social History   Socioeconomic History  . Marital status: Single    Spouse name: Not on file  . Number of children: 3  . Years of education: Not on file  . Highest education level: Not on file  Occupational History  . Not on file  Tobacco Use  . Smoking status: Current Every Day Smoker    Packs/day: 1.00    Types: Cigarettes  . Smokeless tobacco: Never Used  Vaping Use  . Vaping Use: Never used  Substance and Sexual Activity  . Alcohol use: Yes    Comment: occasionally  . Drug use: No  . Sexual activity: Yes    Birth control/protection: None  Other Topics  Concern  . Not on file  Social History Narrative   Lives with children   "lots of caffeine"   Social Determinants of Health   Financial Resource Strain: Not on file  Food Insecurity: Not on file  Transportation Needs: Not on file  Physical Activity: Not on file  Stress: Not on file  Social Connections: Not on file  Intimate Partner Violence: Not on file     PHYSICAL EXAM  GENERAL EXAM/CONSTITUTIONAL: Vitals:  Vitals:   10/30/20 1120  BP: 128/75  Pulse: 100  Weight: 159 lb 3.2 oz (72.2 kg)  Height: '5\' 5"'  (1.651 m)   Body mass index is 26.49 kg/m. Wt Readings from Last 3 Encounters:  10/30/20 159 lb 3.2 oz (72.2 kg)  04/14/20 165 lb (74.8 kg)  12/06/19 160 lb (72.6 kg)    Patient is in no distress; well developed, nourished and groomed; neck is supple  CARDIOVASCULAR:  Examination of carotid arteries is normal; no carotid bruits  Regular rate and rhythm, no murmurs  Examination of peripheral vascular system by observation and palpation is normal  EYES:  Ophthalmoscopic exam of optic discs and posterior segments is normal; no papilledema or hemorrhages No exam data present  MUSCULOSKELETAL:  Gait, strength, tone, movements noted in Neurologic exam below  NEUROLOGIC: MENTAL STATUS:  No flowsheet data found.  awake, alert, oriented to person, place and time  recent and remote memory intact  normal attention and concentration  language fluent, comprehension intact, naming intact  fund of knowledge appropriate  CRANIAL NERVE:   2nd - no papilledema on fundoscopic exam  2nd, 3rd, 4th, 6th - pupils equal and reactive to light, visual fields full to confrontation, extraocular muscles intact, no nystagmus  5th - facial sensation symmetric  7th - facial strength symmetric  8th - hearing intact  9th - palate elevates symmetrically, uvula midline  11th - shoulder shrug symmetric  12th - tongue protrusion midline  MOTOR:   normal bulk and  tone, full strength in the BUE, LLE  RLE --> HIP FLEX 2-3, KNEE EXT 3, KNEE FLEX 4, FOOT DF 4  SENSORY:   normal and symmetric to light touch, temperature, vibration  COORDINATION:   finger-nose-finger, fine finger movements normal  REFLEXES:   deep tendon reflexes TRACE and symmetric  GAIT/STATION:   narrow based gait; LIMPING ON RIGHT LEG     DIAGNOSTIC DATA (LABS, IMAGING, TESTING) - I reviewed patient records, labs, notes, testing and imaging myself where available.  Lab Results  Component Value Date   WBC 6.5 04/14/2020   HGB 15.1 (H)  04/14/2020   HCT 46.3 (H) 04/14/2020   MCV 81.7 04/14/2020   PLT 250 04/14/2020      Component Value Date/Time   NA 138 04/14/2020 1335   NA 127 (L) 11/27/2017 1708   K 3.7 04/14/2020 1335   CL 107 04/14/2020 1335   CO2 21 (L) 04/14/2020 1335   GLUCOSE 76 04/14/2020 1335   BUN 10 04/14/2020 1335   BUN 12 11/27/2017 1708   CREATININE 0.89 04/14/2020 1335   CALCIUM 8.2 (L) 04/14/2020 1335   PROT 5.9 (L) 10/27/2019 1228   PROT 6.6 11/27/2017 1708   ALBUMIN 2.4 (L) 10/27/2019 1228   ALBUMIN 4.2 11/27/2017 1708   AST 15 10/27/2019 1228   ALT 9 10/27/2019 1228   ALKPHOS 84 10/27/2019 1228   BILITOT 1.0 10/27/2019 1228   BILITOT <0.2 11/27/2017 1708   GFRNONAA >60 04/14/2020 1335   GFRAA >60 04/14/2020 1335   No results found for: CHOL, HDL, LDLCALC, LDLDIRECT, TRIG, CHOLHDL Lab Results  Component Value Date   HGBA1C 14.0 11/28/2017   No results found for: VITAMINB12 Lab Results  Component Value Date   TSH 0.507 11/27/2017    12/06/2019 MRI lumbar spine [I reviewed images myself and agree with interpretation. -VRP]  1. No acute abnormality of the lumbar spine. 2. Minimal degenerative changes of the facet joints at L4-5 and L5-S1. 3. Transitional lumbosacral anatomy with S1 being lumbarized as well as 12 rib-bearing vertebra in the chest.   04/14/2020 MRI brain [I reviewed images myself and agree with  interpretation. -VRP]  1. No acute intracranial abnormality. 2. No evidence of venous sinus thrombosis. 3. Scattered supratentorial white matter T2 hyperintensities. Differential includes sequela of migraines, post infectious/inflammatory sequela and demyelination.   ASSESSMENT AND PLAN  45 y.o. year old female here with:   Dx:  1. Right leg weakness   2. Chronic pain of right knee       PLAN:  RIGHT LEG WEAKNESS (due to right knee arthritis + diabetic lumbosacral plexopathy + diabetic neuropathy) - check B12, A1c, TSH, CK, EMG/NCS - continue diabetes treatments - consider orthopedic / sports med referral for right knee pain; may consider PT evaluation  Orders Placed This Encounter  Procedures  . Vitamin B12  . Hemoglobin A1c  . TSH  . CK  . NCV with EMG(electromyography)   Return for for NCV/EMG.    Penni Bombard, MD 11/30/4008, 27:25 AM Certified in Neurology, Neurophysiology and Neuroimaging  [facility] [address], [place], [place] [address] [phone]

## 2020-10-31 LAB — TSH: TSH: 1.04 u[IU]/mL (ref 0.450–4.500)

## 2020-10-31 LAB — CK: Total CK: 98 U/L (ref 32–182)

## 2020-10-31 LAB — HEMOGLOBIN A1C
Est. average glucose Bld gHb Est-mCnc: 318 mg/dL
Hgb A1c MFr Bld: 12.7 % — ABNORMAL HIGH (ref 4.8–5.6)

## 2020-10-31 LAB — VITAMIN B12: Vitamin B-12: 738 pg/mL (ref 232–1245)

## 2020-11-02 ENCOUNTER — Telehealth: Payer: Self-pay | Admitting: *Deleted

## 2020-11-02 NOTE — Telephone Encounter (Signed)
Spoke with patient and informed her labs were unremarkable labs except A1c 12.7 (which is slightly better than prior). Asked of she sees PCP regularly; she stated she sees PA. Call was suddenly ended. Faxed results to PCP for review.

## 2020-11-02 NOTE — Telephone Encounter (Signed)
Patient called back and I informed her I faxed results to her PCP for review. Patient verbalized understanding, appreciation.

## 2020-11-09 ENCOUNTER — Telehealth: Payer: Self-pay | Admitting: Diagnostic Neuroimaging

## 2020-11-09 ENCOUNTER — Encounter: Payer: BC Managed Care – PPO | Admitting: Diagnostic Neuroimaging

## 2020-11-09 NOTE — Telephone Encounter (Signed)
Patient canceled her EMG/NCS for today.

## 2020-11-09 NOTE — Telephone Encounter (Signed)
Pt called to cancel appt, informed Pt before I can reschedule I would need to transfer you to Billing to pay no show fee of $50. Pt hung up while I was transferring the call.

## 2020-12-06 ENCOUNTER — Other Ambulatory Visit: Payer: Self-pay

## 2020-12-06 ENCOUNTER — Other Ambulatory Visit: Payer: Self-pay | Admitting: Family Medicine

## 2020-12-06 ENCOUNTER — Ambulatory Visit
Admission: RE | Admit: 2020-12-06 | Discharge: 2020-12-06 | Disposition: A | Discharge status: Home / Self Care | Payer: BC Managed Care – PPO | Source: Ambulatory Visit | Attending: Family Medicine | Admitting: Family Medicine

## 2020-12-06 DIAGNOSIS — R52 Pain, unspecified: Secondary | ICD-10-CM

## 2020-12-12 ENCOUNTER — Other Ambulatory Visit: Payer: Self-pay | Admitting: Family Medicine

## 2020-12-12 DIAGNOSIS — M79604 Pain in right leg: Secondary | ICD-10-CM

## 2020-12-13 ENCOUNTER — Ambulatory Visit
Admission: RE | Admit: 2020-12-13 | Discharge: 2020-12-13 | Disposition: A | Discharge status: Home / Self Care | Payer: BC Managed Care – PPO | Source: Ambulatory Visit | Attending: Family Medicine | Admitting: Family Medicine

## 2020-12-13 ENCOUNTER — Other Ambulatory Visit: Payer: Self-pay | Admitting: Family Medicine

## 2020-12-13 DIAGNOSIS — M25561 Pain in right knee: Secondary | ICD-10-CM

## 2021-02-16 ENCOUNTER — Emergency Department (HOSPITAL_COMMUNITY)
Admission: EM | Admit: 2021-02-16 | Discharge: 2021-02-17 | Disposition: A | Discharge status: Home / Self Care | Payer: BC Managed Care – PPO | Attending: Emergency Medicine | Admitting: Emergency Medicine

## 2021-02-16 ENCOUNTER — Emergency Department (HOSPITAL_COMMUNITY): Payer: BC Managed Care – PPO

## 2021-02-16 DIAGNOSIS — Z79899 Other long term (current) drug therapy: Secondary | ICD-10-CM | POA: Diagnosis not present

## 2021-02-16 DIAGNOSIS — R609 Edema, unspecified: Secondary | ICD-10-CM | POA: Insufficient documentation

## 2021-02-16 DIAGNOSIS — Z794 Long term (current) use of insulin: Secondary | ICD-10-CM | POA: Diagnosis not present

## 2021-02-16 DIAGNOSIS — E119 Type 2 diabetes mellitus without complications: Secondary | ICD-10-CM | POA: Insufficient documentation

## 2021-02-16 DIAGNOSIS — F17210 Nicotine dependence, cigarettes, uncomplicated: Secondary | ICD-10-CM | POA: Insufficient documentation

## 2021-02-16 DIAGNOSIS — M25561 Pain in right knee: Secondary | ICD-10-CM | POA: Insufficient documentation

## 2021-02-16 DIAGNOSIS — G8918 Other acute postprocedural pain: Secondary | ICD-10-CM | POA: Insufficient documentation

## 2021-02-16 DIAGNOSIS — I10 Essential (primary) hypertension: Secondary | ICD-10-CM | POA: Diagnosis not present

## 2021-02-16 DIAGNOSIS — G8929 Other chronic pain: Secondary | ICD-10-CM

## 2021-02-16 LAB — BASIC METABOLIC PANEL
Anion gap: 8 (ref 5–15)
BUN: 16 mg/dL (ref 6–20)
CO2: 23 mmol/L (ref 22–32)
Calcium: 8.8 mg/dL — ABNORMAL LOW (ref 8.9–10.3)
Chloride: 106 mmol/L (ref 98–111)
Creatinine, Ser: 0.88 mg/dL (ref 0.44–1.00)
GFR, Estimated: >60 mL/min (ref >60)
Glucose, Bld: 176 mg/dL — ABNORMAL HIGH (ref 70–99)
Potassium: 3.6 mmol/L (ref 3.5–5.1)
Sodium: 137 mmol/L (ref 135–145)

## 2021-02-16 LAB — CBC
HCT: 44.4 % (ref 36.0–46.0)
Hemoglobin: 14.8 g/dL (ref 12.0–15.0)
MCH: 27.1 pg (ref 26.0–34.0)
MCHC: 33.3 g/dL (ref 30.0–36.0)
MCV: 81.2 fL (ref 80.0–100.0)
Platelets: 204 10*3/uL (ref 150–400)
RBC: 5.47 MIL/uL — ABNORMAL HIGH (ref 3.87–5.11)
RDW: 15.5 % (ref 11.5–15.5)
WBC: 11.5 10*3/uL — ABNORMAL HIGH (ref 4.0–10.5)
nRBC: 0 % (ref 0.0–0.2)

## 2021-02-16 LAB — BRAIN NATRIURETIC PEPTIDE: B Natriuretic Peptide: 39.6 pg/mL (ref 0.0–100.0)

## 2021-02-16 LAB — URIC ACID: Uric Acid, Serum: 5.3 mg/dL (ref 2.5–7.1)

## 2021-02-16 LAB — SEDIMENTATION RATE: Sed Rate: 56 mm/hr — ABNORMAL HIGH (ref 0–22)

## 2021-02-16 MED ORDER — ACETAMINOPHEN 500 MG PO TABS: 1000.0000 mg | ORAL_TABLET | Freq: Once | ORAL | Status: DC

## 2021-02-16 NOTE — ED Provider Notes (Signed)
Emergency Medicine Provider Triage Evaluation Note  Stacey Lucas , a 45 y.o. female  was evaluated in triage.  Pt complains of right lower leg swelling and knee pain.  Symptoms have been going on for possibly greater than 6 months.  Patient has been seen by vascular surgery and no history of DVT.  Sometimes the knee swells quite a bit.  Currently limited swelling of the knee.  Now she is noting that the other leg is starting to swell some more, she believes this is due to changing the way she is weightbearing while prolonged standing at work.  Review of Systems  Positive: Swelling in the ankles Negative: No chest pain, no fever, no chills, no shortness of breath  Physical Exam  BP (!) 163/89   Pulse 99   Temp 98.8 F (37.1 C)   Resp 20   Ht 5\' 5"  (1.651 m)   Wt 76.2 kg   SpO2 100%   BMI 27.96 kg/m  Gen:   Awake, no distress   Resp:  Normal effort lungs clear to auscultation no wheeze rhonchi rale MSK:   Slight appearance of effusion or swelling of the right knee compared to the left.  No large effusion at this time.  Patient has 2+ edema at the ankle on the left and 1+ edema of the ankle on the right.  Dorsalis pedis pulses are 2+ and symmetric. Other:  Heart regular no rub murmur gallop  Medical Decision Making  Medically screening exam initiated at 8:49 PM.  Appropriate orders placed.  Haislee Corso was informed that the remainder of the evaluation will be completed by another provider, this initial triage assessment does not replace that evaluation, and the importance of remaining in the ED until their evaluation is complete.     [name], MD 02/16/21 2052

## 2021-02-16 NOTE — ED Provider Notes (Signed)
[place] DEPT Provider Note  CSN: [id number] Arrival date & time: 02/16/21 1914  Chief Complaint(s) Leg Swelling  HPI Stacey Lucas is a 45 y.o. female here with right knee pain and leg swelling. Ongoing for approx 1 yr. Intermittent. Worse with movement and palpation. Mildly alleviated my motrin and celebrex. This week, swelling is worse. Noted swelling in left leg as well. No trauma No h/o of blood clots. Had negative [place] in April by her PCP. No prolonged immobilization. No chest pain or SOB.    The history is provided by the patient.   Past Medical History Past Medical History:  Diagnosis Date   Anxiety and depression    Diabetes mellitus without complication (HCC)    Type II   GERD (gastroesophageal reflux disease)    Hyperlipidemia    Hypertension    Impingement syndrome of right shoulder    Migraine    Pyelonephritis    Sciatica    Patient Active Problem List   Diagnosis Date Noted   MDD (major depressive disorder), recurrent severe, without psychosis ([place]) 04/27/2018   MDD (major depressive disorder), severe ([place]) 04/25/2018   Home Medication(s) Prior to Admission medications   Medication Sig Start Date End Date Taking? Authorizing Provider  furosemide (LASIX) 20 MG tablet Take 1 tablet (20 mg total) by mouth daily for 7 days. 02/17/21 02/24/21 Yes Fraser Busche, Grayce Sessions, MD  blood glucose meter kit and supplies KIT Check fasting blood sugar in the morning. 11/27/17   Jaynee Eagles, PA-C  gabapentin (NEURONTIN) 300 MG capsule Take 1 capsule (300 mg total) by mouth 3 (three) times daily. Patient not taking: Reported on 10/30/2020 11/05/18   Nat Christen, MD  Insulin Glargine [facility]) 100 UNIT/ML [facility]  06/14/19   [provider]  Insulin Pen Needle (PEN NEEDLES) 32G X 4 MM MISC 1 each by Does not apply route at bedtime. 01/30/18   Jaynee Eagles, PA-C  lisinopril (PRINIVIL,ZESTRIL) 5 MG tablet Take 1 tablet (5 mg total) by  mouth daily. Patient not taking: Reported on 10/30/2020 04/27/18   Mordecai Maes, NP  OZEMPIC, 0.25 OR 0.5 MG/DOSE, 2 MG/1.5ML SOPN INJECT 0.25MG SQ WEEKLY 06/11/19   [provider]                                                                                                                                    Past Surgical History Past Surgical History:  Procedure Laterality Date   tubal     Family History Family History  Problem Relation Age of Onset   Diabetes Mother    Hypertension Mother    Arthritis Mother    Diabetes Father    Hypertension Father     Social History Social History   Tobacco Use   Smoking status: Every Day    Packs/day: 1.00    Pack years: 0.00    Types: Cigarettes   Smokeless tobacco: Never  Vaping Use   Vaping Use: Never used  Substance Use Topics   Alcohol use: Yes    Comment: occasionally   Drug use: No   Allergies Patient has no known allergies.  Review of Systems Review of Systems All other systems are reviewed and are negative for acute change except as noted in the HPI  Physical Exam Vital Signs  I have reviewed the triage vital signs BP (!) 144/93   Pulse 93   Temp 98.8 F (37.1 C)   Resp 18   Ht '5\' 5"'  (1.651 m)   Wt 76.2 kg   SpO2 100%   BMI 27.96 kg/m   Physical Exam Vitals reviewed.  Constitutional:      General: She is not in acute distress.    Appearance: She is well-developed. She is not diaphoretic.  HENT:     Head: Normocephalic and atraumatic.     Right Ear: External ear normal.     Left Ear: External ear normal.     Nose: Nose normal.  Eyes:     General: No scleral icterus.    Conjunctiva/sclera: Conjunctivae normal.  Neck:     Trachea: Phonation normal.  Cardiovascular:     Rate and Rhythm: Normal rate and regular rhythm.  Pulmonary:     Effort: Pulmonary effort is normal. No respiratory distress.     Breath sounds: No stridor.  Abdominal:     General: There is no distension.   Musculoskeletal:        General: Normal range of motion.     Cervical back: Normal range of motion.     Right knee: Tenderness present.     Left knee: No tenderness.     Right lower leg: 2+ Pitting Edema present.     Left lower leg: 2+ Pitting Edema present.  Neurological:     Mental Status: She is alert and oriented to person, place, and time.  Psychiatric:        Behavior: Behavior normal.    ED Results and Treatments Labs (all labs ordered are listed, but only abnormal results are displayed) Labs Reviewed  BASIC METABOLIC PANEL - Abnormal; Notable for the following components:      Result Value   Glucose, Bld 176 (*)    Calcium 8.8 (*)    All other components within normal limits  CBC - Abnormal; Notable for the following components:   WBC 11.5 (*)    RBC 5.47 (*)    All other components within normal limits  SEDIMENTATION RATE - Abnormal; Notable for the following components:   Sed Rate 56 (*)    All other components within normal limits  HEPATIC FUNCTION PANEL - Abnormal; Notable for the following components:   Total Protein 6.0 (*)    Albumin 2.3 (*)    All other components within normal limits  URIC ACID  BRAIN NATRIURETIC PEPTIDE  EKG  EKG Interpretation  Date/Time:    Ventricular Rate:    PR Interval:    QRS Duration:   QT Interval:    QTC Calculation:   R Axis:     Text Interpretation:          Radiology DG Knee Complete 4 Views Right  Result Date: 02/16/2021 CLINICAL DATA:  Knee swelling, pain EXAM: RIGHT KNEE - COMPLETE 4+ VIEW COMPARISON:  None. FINDINGS: No evidence of fracture, dislocation, or joint effusion. No evidence of arthropathy or other focal bone abnormality. Soft tissues are unremarkable. IMPRESSION: Negative. Electronically Signed   By: Rolm Baptise M.D.   On: 02/16/2021 21:10    Pertinent labs & imaging results  that were available during my care of the patient were reviewed by me and considered in my medical decision making (see chart for details).  Medications Ordered in ED Medications  acetaminophen (TYLENOL) tablet 1,000 mg (has no administration in time range)                                                                                                                                    Procedures Procedures  (including critical care time)  Medical Decision Making / ED Course I have reviewed the nursing notes for this encounter and the patient's prior records (if available in EHR or on provided paperwork).   Stacey Lucas was evaluated in Emergency Department on 02/17/2021 for the symptoms described in the history of present illness. She was evaluated in the context of the global COVID-19 pandemic, which necessitated consideration that the patient might be at risk for infection with the SARS-CoV-2 virus that causes COVID-19. Institutional protocols and algorithms that pertain to the evaluation of patients at risk for COVID-19 are in a state of rapid change based on information released by regulatory bodies including the CDC and federal and state organizations. These policies and algorithms were followed during the patient's care in the ED.  Chronic right knee pain  Plain film negative for any acute fracture or dislocation.  No evidence of effusion concerning for gout or septic arthritis. Recommended rehab exercises and brace for support. PCP follow-up.  bilateral lower extremity edema. Work-up not consistent with heart failure. No renal or hepatic insufficiency. Patient is already been worked up for possible DVT. Likely venous insufficiency. Recommended compression stockings for edema. Will provide short course of Lasix and have patient follow-up with PCP.        Final Clinical Impression(s) / ED Diagnoses Final diagnoses:  Chronic pain of right knee  Peripheral edema    The  patient appears reasonably screened and/or stabilized for discharge and I doubt any other medical condition or other [facility] requiring further screening, evaluation, or treatment in the ED at this time prior to discharge. Safe for discharge with strict return precautions.  Disposition: Discharge  Condition: Good  I have discussed the results, Dx and Tx plan with the  patient/family who expressed understanding and agree(s) with the plan. Discharge instructions discussed at length. The patient/family was given strict return precautions who verbalized understanding of the instructions. No further questions at time of discharge.    ED Discharge Orders          Ordered    furosemide (LASIX) 20 MG tablet  Daily        02/17/21 0122            Follow Up: Fanny Bien, [address] [place] [address] [phone]  Call  to schedule an appointment for close follow up     This chart was dictated using voice recognition software.  Despite best efforts to proofread,  errors can occur which can change the documentation meaning.    Fatima Blank, MD 02/17/21 [phone]

## 2021-02-16 NOTE — ED Triage Notes (Signed)
Per EMS, patient from home, intermittent swelling and pain to right lower leg x1 year. Reports pain worsening x2 weeks. 2+ pitting edema to right foot. Hx diabetes. Denies injury.

## 2021-02-17 LAB — HEPATIC FUNCTION PANEL
ALT: 10 U/L (ref 0–44)
AST: 16 U/L (ref 15–41)
Albumin: 2.3 g/dL — ABNORMAL LOW (ref 3.5–5.0)
Alkaline Phosphatase: 81 U/L (ref 38–126)
Bilirubin, Direct: 0.1 mg/dL (ref 0.0–0.2)
Indirect Bilirubin: 0.8 mg/dL (ref 0.3–0.9)
Total Bilirubin: 0.9 mg/dL (ref 0.3–1.2)
Total Protein: 6 g/dL — ABNORMAL LOW (ref 6.5–8.1)

## 2021-02-17 MED ORDER — FUROSEMIDE 20 MG PO TABS
20.0000 mg | ORAL_TABLET | Freq: Every day | ORAL | 0 refills | Status: DC
Start: 2021-02-17 — End: 2021-05-26

## 2021-02-17 MED ORDER — ACETAMINOPHEN 500 MG PO TABS
1000.0000 mg | ORAL_TABLET | Freq: Once | ORAL | Status: AC
  Administered 2021-02-17: 1000 mg via ORAL
  Filled 2021-02-17: qty 2

## 2021-03-13 ENCOUNTER — Ambulatory Visit
Admission: RE | Admit: 2021-03-13 | Discharge: 2021-03-13 | Disposition: A | Discharge status: Home / Self Care | Payer: BC Managed Care – PPO | Source: Ambulatory Visit | Attending: Family Medicine | Admitting: Family Medicine

## 2021-03-13 ENCOUNTER — Other Ambulatory Visit: Payer: Self-pay | Admitting: Family Medicine

## 2021-03-13 ENCOUNTER — Other Ambulatory Visit: Payer: Self-pay

## 2021-03-13 DIAGNOSIS — R52 Pain, unspecified: Secondary | ICD-10-CM

## 2021-03-13 DIAGNOSIS — M79604 Pain in right leg: Secondary | ICD-10-CM

## 2021-03-13 DIAGNOSIS — R600 Localized edema: Secondary | ICD-10-CM

## 2021-03-13 DIAGNOSIS — W19XXXA Unspecified fall, initial encounter: Secondary | ICD-10-CM

## 2021-03-14 ENCOUNTER — Other Ambulatory Visit: Payer: BC Managed Care – PPO

## 2021-05-08 ENCOUNTER — Ambulatory Visit: Payer: BC Managed Care – PPO | Attending: Physical Therapy | Admitting: Physical Therapy

## 2021-05-09 ENCOUNTER — Ambulatory Visit: Payer: BC Managed Care – PPO | Admitting: Physical Therapy

## 2021-05-21 ENCOUNTER — Ambulatory Visit: Payer: BC Managed Care – PPO | Attending: Physical Therapy

## 2021-05-21 ENCOUNTER — Other Ambulatory Visit: Payer: Self-pay

## 2021-05-21 DIAGNOSIS — R2689 Other abnormalities of gait and mobility: Secondary | ICD-10-CM | POA: Diagnosis present

## 2021-05-21 DIAGNOSIS — M6281 Muscle weakness (generalized): Secondary | ICD-10-CM | POA: Insufficient documentation

## 2021-05-21 DIAGNOSIS — M25561 Pain in right knee: Secondary | ICD-10-CM | POA: Diagnosis present

## 2021-05-21 DIAGNOSIS — G8929 Other chronic pain: Secondary | ICD-10-CM | POA: Diagnosis present

## 2021-05-21 NOTE — Therapy (Addendum)
[facility] [address] [place], [place], [address] Phone: [phone]   Fax:  [phone]  Physical Therapy Evaluation/Discharge  Patient Details  Name: Stacey Lucas MRN: [id number] Date of Birth: 1976-07-10 Referring Provider (PT): Lewis Moccasin, MD   Encounter Date: 05/21/2021   PT End of Session - 05/21/21 1439     Visit Number 1    Number of Visits 17    Date for PT Re-Evaluation 07/16/21    Authorization Type BC[employer]    PT Start Time 1411   arrived late   PT Stop Time 1445    PT Time Calculation (min) 34 min    Activity Tolerance Patient limited by pain    Behavior During Therapy [facility] for tasks assessed/performed             Past Medical History:  Diagnosis Date   Anxiety and depression    Diabetes mellitus without complication (HCC)    Type II   GERD (gastroesophageal reflux disease)    Hyperlipidemia    Hypertension    Impingement syndrome of right shoulder    Migraine    Pyelonephritis    Sciatica     Past Surgical History:  Procedure Laterality Date   tubal      There were no vitals filed for this visit.    Subjective Assessment - 05/21/21 1413     Subjective Pt presents to PT with reports of chronic R knee pain and instability. She notes that she has noticed increased difficulty with stair navigation and prolonged sitting over the last 10 months. She notes that she has had numerous falls in the last year, with increasing instability towards R. 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 Pt has SPC at home but does not use, has generally been furniture surfing around home to get around safely. No prior trauma to R knee, just became increasingly more unstable.    Limitations Standing;Sitting;Walking    How long can you sit comfortably? 5-10 minutes    How long can you stand comfortably? 5-10 minutes    How long can you walk comfortably? 1-2 minutes    Currently  in Pain? Yes    Pain Score 7     Pain Location Knee   10/10 at worst   Pain Orientation Right    Pain Descriptors / Indicators Aching;Sharp    Pain Onset More than a month ago    Pain Frequency Constant    Aggravating Factors  walking, standing, prolonged sitting           [facility] Adult PT Treatment/Exercise:   Therapeutic Exercise:  Quad sets x 10 - 5 sec hold Seated clamshell x 10 red tband Gait Training: Amb 70ft with SPC with PT providing verbal cues for sequencing in step-to pattern      [facility] PT Assessment - 05/21/21 0001       Assessment   Medical Diagnosis M25.361 (ICD-10-CM) - Other instability, right knee    Referring Provider (PT) Lewis Moccasin, MD    Hand Dominance Right      Precautions   Precautions Fall      Restrictions   Weight Bearing Restrictions No      Balance Screen   Has the patient fallen in the past 6 months Yes    How many times? numerous - loses balance towards R d/t knee instability    Has the patient had a decrease in activity level because  of a fear of falling?  Yes    Is the patient reluctant to leave their home because of a fear of falling?  Yes      Home Environment   Living Environment Private residence    Living Arrangements Spouse/significant other;Children    Type of Home Apartment    Home Access Level entry    Home Layout One level      Prior Function   Level of Independence Needs assistance with ADLs;Needs assistance with gait    Vocation Full time employment    Vocation Requirements housekeeping with [employer]      Cognition   Overall Cognitive Status Within Functional Limits for tasks assessed      Observation/Other Assessments   Observations heavy reliance and leaning on walls to walk back to clinic    Focus on Therapeutic Outcomes (FOTO)  1% function; 48% predicted      AROM   Right Knee Extension -5   hyperextension   Right Knee Flexion 125      Strength   Right Hip Flexion 3/5    Right Hip ABduction 3+/5     Right Hip ADduction 3+/5    Left Hip Flexion 3+/5    Left Hip ABduction 3+/5    Left Hip ADduction 3+/5    Right Knee Flexion 3+/5    Right Knee Extension 3+/5    Left Knee Flexion 3+/5    Left Knee Extension 3+/5      Palpation   Patella mobility lateral glide hypomobility; no change with mconnell taping    Palpation comment pain to R distal medial/lateral quad, R medial knee joint line      Transfers   Five time sit to stand comments  45 seconds with UE assist      Ambulation/Gait   Gait Comments Pt amb with very unsteady gait, heavy reliance on walls with R knee hypertension, R lateral lean;                        Objective measurements completed on examination: See above findings.                PT Education - 05/21/21 1530     Education Details eval findings, HEP, POC    Person(s) Educated Patient    Methods Explanation;Demonstration;Handout    Comprehension Verbalized understanding;Returned demonstration              PT Short Term Goals - 05/21/21 1623       PT SHORT TERM GOAL #1   Title Pt will be compliant and knowledgeable with 90% of initial HEP for improved carryover    Baseline initial HEP given    Time 3    Period Weeks    Status New    Target Date 06/11/21      PT SHORT TERM GOAL #2   Title --               PT Long Term Goals - 05/21/21 1625       PT LONG TERM GOAL #1   Title Pt will decrease 5xSTS to no greater than 25 seconds for improved balance and functional mobility    Baseline 45 seconds w/ UE assist    Time 8    Period Weeks    Status New    Target Date 07/16/21      PT LONG TERM GOAL #2   Title Pt will improve FOTO score to  no less than 48% as proxy for functional improvement    Baseline 1% function    Time 8    Period Weeks    Status New    Target Date 07/16/21      PT LONG TERM GOAL #3   Title Pt will be able to ambulate up/down 10 stairs w/ reciprocal gait, no inc in R knee pain     Baseline unable    Time 8    Period Weeks    Status New    Target Date 07/16/21      PT LONG TERM GOAL #4   Title Pt will self report R knee pain no greater than 3/10 at worst for improved comfort and function    Baseline 10/10 at worst    Time 8    Period Weeks    Status New    Target Date 07/16/21                    Plan - 05/21/21 1534     Clinical Impression Statement Pt presents to PT with reports of chronic R knee pain and instability. Physical findings are consistent with MD impression, as pt demonstrates significantly unsteady gait, LE weakness, and R knee pain with patellar mobs. Her 5xSTS demonstrates she is at a high risk for falls and reduced LE muscle strength. Pt's FOTO score also demonstrates sharp departure from normative value and shows she is operating well below her PLOF. Pt would benefit from skilled PT services working on improving LE strength and gait stability in order to improve comfort and safety. Will assess response to initial HEP and progress as able.    Personal Factors and Comorbidities Fitness;Comorbidity 2    Comorbidities HTN, DMII    Examination-Activity Limitations Sit;Squat;Stairs;Lift;Locomotion Level;Transfers    Examination-Participation Restrictions Occupation;Community Activity;Volunteer    Stability/Clinical Decision Making Stable/Uncomplicated    Clinical Decision Making Low    Rehab Potential Good    PT Frequency 2x / week    PT Duration 8 weeks    PT Treatment/Interventions ADLs/Self Care Home Management;Cryotherapy;Electrical Stimulation;Moist Heat;Gait training;Stair training;Functional mobility training;Therapeutic activities;Therapeutic exercise;Balance training;Neuromuscular re-education;Patient/family education;Manual techniques;Dry needling;Taping;Vasopneumatic Device    PT Next Visit Plan perform TUG, review FOTO, assess HEP response, progress as able    PT Home Exercise Plan [email or web address]    Consulted and Agree with Plan of  Care Patient             Patient will benefit from skilled therapeutic intervention in order to improve the following deficits and impairments:  Abnormal gait, Decreased activity tolerance, Decreased balance, Decreased endurance, Decreased mobility, Decreased strength, Difficulty walking, Impaired sensation, Improper body mechanics, Pain  Visit Diagnosis: Chronic pain of right knee  Muscle weakness (generalized)  Other abnormalities of gait and mobility     Problem List Patient Active Problem List   Diagnosis Date Noted   MDD (major depressive disorder), recurrent severe, without psychosis (HCC) 04/27/2018   MDD (major depressive disorder), severe (HCC) 04/25/2018    Eloy Endavid C Kalyn Hofstra, PT 05/21/2021, 4:38 PM  [facility] [address] [place], [place], [address] Phone: [phone]   Fax:  [phone]  Name: Cherly Beacherian Hoots MRN: [id number] Date of Birth: 03/02/1976  PHYSICAL THERAPY DISCHARGE SUMMARY  Visits from Start of Care: 1  Current functional level related to goals / functional outcomes: Unable to assess d/t acute CVA   Remaining deficits: Unable to assess d/t acute CVA   Education / Equipment: Unable to  assess d/t acute CVA   Patient agrees to discharge. Patient goals were  Unable to assess d/t acute CVA . Patient is being discharged due to  acute change in status with CVA, patient is having home health and would most like be more appropriate for neuro-rehab when able.

## 2021-05-23 ENCOUNTER — Observation Stay (HOSPITAL_BASED_OUTPATIENT_CLINIC_OR_DEPARTMENT_OTHER): Payer: BC Managed Care – PPO

## 2021-05-23 ENCOUNTER — Other Ambulatory Visit: Payer: Self-pay

## 2021-05-23 ENCOUNTER — Encounter (HOSPITAL_COMMUNITY): Payer: Self-pay | Admitting: Emergency Medicine

## 2021-05-23 ENCOUNTER — Inpatient Hospital Stay (HOSPITAL_COMMUNITY)
Admission: EM | Admit: 2021-05-23 | Discharge: 2021-05-26 | DRG: 065 | Disposition: A | Discharge status: Home Health | Payer: BC Managed Care – PPO | Attending: Family Medicine | Admitting: Family Medicine

## 2021-05-23 ENCOUNTER — Emergency Department (HOSPITAL_COMMUNITY): Payer: BC Managed Care – PPO

## 2021-05-23 DIAGNOSIS — R4781 Slurred speech: Secondary | ICD-10-CM | POA: Diagnosis present

## 2021-05-23 DIAGNOSIS — E875 Hyperkalemia: Secondary | ICD-10-CM | POA: Diagnosis present

## 2021-05-23 DIAGNOSIS — E119 Type 2 diabetes mellitus without complications: Secondary | ICD-10-CM

## 2021-05-23 DIAGNOSIS — Z833 Family history of diabetes mellitus: Secondary | ICD-10-CM

## 2021-05-23 DIAGNOSIS — I639 Cerebral infarction, unspecified: Secondary | ICD-10-CM | POA: Diagnosis not present

## 2021-05-23 DIAGNOSIS — Z8673 Personal history of transient ischemic attack (TIA), and cerebral infarction without residual deficits: Secondary | ICD-10-CM

## 2021-05-23 DIAGNOSIS — I6330 Cerebral infarction due to thrombosis of unspecified cerebral artery: Secondary | ICD-10-CM | POA: Diagnosis not present

## 2021-05-23 DIAGNOSIS — E785 Hyperlipidemia, unspecified: Secondary | ICD-10-CM | POA: Diagnosis present

## 2021-05-23 DIAGNOSIS — I6389 Other cerebral infarction: Secondary | ICD-10-CM | POA: Diagnosis not present

## 2021-05-23 DIAGNOSIS — Z8261 Family history of arthritis: Secondary | ICD-10-CM

## 2021-05-23 DIAGNOSIS — R531 Weakness: Secondary | ICD-10-CM | POA: Diagnosis not present

## 2021-05-23 DIAGNOSIS — Z79899 Other long term (current) drug therapy: Secondary | ICD-10-CM

## 2021-05-23 DIAGNOSIS — G8929 Other chronic pain: Secondary | ICD-10-CM | POA: Diagnosis present

## 2021-05-23 DIAGNOSIS — F17210 Nicotine dependence, cigarettes, uncomplicated: Secondary | ICD-10-CM | POA: Diagnosis present

## 2021-05-23 DIAGNOSIS — F332 Major depressive disorder, recurrent severe without psychotic features: Secondary | ICD-10-CM | POA: Diagnosis present

## 2021-05-23 DIAGNOSIS — K219 Gastro-esophageal reflux disease without esophagitis: Secondary | ICD-10-CM | POA: Diagnosis present

## 2021-05-23 DIAGNOSIS — I10 Essential (primary) hypertension: Secondary | ICD-10-CM | POA: Diagnosis not present

## 2021-05-23 DIAGNOSIS — G8194 Hemiplegia, unspecified affecting left nondominant side: Secondary | ICD-10-CM | POA: Diagnosis present

## 2021-05-23 DIAGNOSIS — E041 Nontoxic single thyroid nodule: Secondary | ICD-10-CM | POA: Diagnosis present

## 2021-05-23 DIAGNOSIS — E042 Nontoxic multinodular goiter: Secondary | ICD-10-CM | POA: Diagnosis present

## 2021-05-23 DIAGNOSIS — R29702 NIHSS score 2: Secondary | ICD-10-CM | POA: Diagnosis present

## 2021-05-23 DIAGNOSIS — R7989 Other specified abnormal findings of blood chemistry: Secondary | ICD-10-CM | POA: Diagnosis present

## 2021-05-23 DIAGNOSIS — R197 Diarrhea, unspecified: Secondary | ICD-10-CM | POA: Diagnosis present

## 2021-05-23 DIAGNOSIS — F1490 Cocaine use, unspecified, uncomplicated: Secondary | ICD-10-CM | POA: Diagnosis present

## 2021-05-23 DIAGNOSIS — E1169 Type 2 diabetes mellitus with other specified complication: Secondary | ICD-10-CM

## 2021-05-23 DIAGNOSIS — Z20822 Contact with and (suspected) exposure to covid-19: Secondary | ICD-10-CM | POA: Diagnosis present

## 2021-05-23 DIAGNOSIS — Z8249 Family history of ischemic heart disease and other diseases of the circulatory system: Secondary | ICD-10-CM

## 2021-05-23 DIAGNOSIS — Z794 Long term (current) use of insulin: Secondary | ICD-10-CM

## 2021-05-23 DIAGNOSIS — R296 Repeated falls: Secondary | ICD-10-CM | POA: Diagnosis present

## 2021-05-23 DIAGNOSIS — M1711 Unilateral primary osteoarthritis, right knee: Secondary | ICD-10-CM

## 2021-05-23 LAB — DIFFERENTIAL
Abs Immature Granulocytes: 0.04 10*3/uL (ref 0.00–0.07)
Basophils Absolute: 0.1 10*3/uL (ref 0.0–0.1)
Basophils Relative: 1 %
Eosinophils Absolute: 0.1 10*3/uL (ref 0.0–0.5)
Eosinophils Relative: 1 %
Immature Granulocytes: 0 %
Lymphocytes Relative: 24 %
Lymphs Abs: 2.9 10*3/uL (ref 0.7–4.0)
Monocytes Absolute: 1.1 10*3/uL — ABNORMAL HIGH (ref 0.1–1.0)
Monocytes Relative: 9 %
Neutro Abs: 7.9 10*3/uL — ABNORMAL HIGH (ref 1.7–7.7)
Neutrophils Relative %: 65 %

## 2021-05-23 LAB — LIPID PANEL
Cholesterol: 341 mg/dL — ABNORMAL HIGH (ref 0–200)
HDL: 65 mg/dL (ref >40)
LDL Cholesterol: 245 mg/dL — ABNORMAL HIGH (ref 0–99)
Total CHOL/HDL Ratio: 5.2 RATIO
Triglycerides: 154 mg/dL — ABNORMAL HIGH (ref <150)
VLDL: 31 mg/dL (ref 0–40)

## 2021-05-23 LAB — ECHOCARDIOGRAM COMPLETE
AR max vel: 2.61 cm2
AV Area VTI: 2.85 cm2
AV Area mean vel: 2.77 cm2
AV Mean grad: 4 mmHg
AV Peak grad: 6.7 mmHg
Ao pk vel: 1.29 m/s
Area-P 1/2: 4.58 cm2
Height: 65 in
S' Lateral: 2.6 cm
Single Plane A4C EF: 54.8 %
Weight: 2412.71 oz

## 2021-05-23 LAB — I-STAT BETA HCG BLOOD, ED (MC, WL, AP ONLY): I-stat hCG, quantitative: 20.2 m[IU]/mL — ABNORMAL HIGH (ref <5)

## 2021-05-23 LAB — CBC
HCT: 41.6 % (ref 36.0–46.0)
Hemoglobin: 14.1 g/dL (ref 12.0–15.0)
MCH: 26.7 pg (ref 26.0–34.0)
MCHC: 33.9 g/dL (ref 30.0–36.0)
MCV: 78.6 fL — ABNORMAL LOW (ref 80.0–100.0)
Platelets: 207 10*3/uL (ref 150–400)
RBC: 5.29 MIL/uL — ABNORMAL HIGH (ref 3.87–5.11)
RDW: 15.6 % — ABNORMAL HIGH (ref 11.5–15.5)
WBC: 12.1 10*3/uL — ABNORMAL HIGH (ref 4.0–10.5)
nRBC: 0 % (ref 0.0–0.2)

## 2021-05-23 LAB — RESP PANEL BY RT-PCR (FLU A&B, COVID) ARPGX2
Influenza A by PCR: NEGATIVE
Influenza B by PCR: NEGATIVE
SARS Coronavirus 2 by RT PCR: NEGATIVE

## 2021-05-23 LAB — COMPREHENSIVE METABOLIC PANEL
ALT: 11 U/L (ref 0–44)
AST: 16 U/L (ref 15–41)
Albumin: 1.3 g/dL — ABNORMAL LOW (ref 3.5–5.0)
Alkaline Phosphatase: 63 U/L (ref 38–126)
Anion gap: 9 (ref 5–15)
BUN: 12 mg/dL (ref 6–20)
CO2: 24 mmol/L (ref 22–32)
Calcium: 8.1 mg/dL — ABNORMAL LOW (ref 8.9–10.3)
Chloride: 104 mmol/L (ref 98–111)
Creatinine, Ser: 1.17 mg/dL — ABNORMAL HIGH (ref 0.44–1.00)
GFR, Estimated: 59 mL/min — ABNORMAL LOW (ref >60)
Glucose, Bld: 122 mg/dL — ABNORMAL HIGH (ref 70–99)
Potassium: 4.8 mmol/L (ref 3.5–5.1)
Sodium: 137 mmol/L (ref 135–145)
Total Bilirubin: 1.5 mg/dL — ABNORMAL HIGH (ref 0.3–1.2)
Total Protein: 4.9 g/dL — ABNORMAL LOW (ref 6.5–8.1)

## 2021-05-23 LAB — HIV ANTIBODY (ROUTINE TESTING W REFLEX): HIV Screen 4th Generation wRfx: NONREACTIVE

## 2021-05-23 LAB — I-STAT CHEM 8, ED
BUN: 16 mg/dL (ref 6–20)
Calcium, Ion: 1 mmol/L — ABNORMAL LOW (ref 1.15–1.40)
Chloride: 107 mmol/L (ref 98–111)
Creatinine, Ser: 1 mg/dL (ref 0.44–1.00)
Glucose, Bld: 122 mg/dL — ABNORMAL HIGH (ref 70–99)
HCT: 42 % (ref 36.0–46.0)
Hemoglobin: 14.3 g/dL (ref 12.0–15.0)
Potassium: 6.4 mmol/L (ref 3.5–5.1)
Sodium: 136 mmol/L (ref 135–145)
TCO2: 24 mmol/L (ref 22–32)

## 2021-05-23 LAB — APTT: aPTT: 38 seconds — ABNORMAL HIGH (ref 24–36)

## 2021-05-23 LAB — CBG MONITORING, ED
Glucose-Capillary: 120 mg/dL — ABNORMAL HIGH (ref 70–99)
Glucose-Capillary: 99 mg/dL (ref 70–99)

## 2021-05-23 LAB — ETHANOL: Alcohol, Ethyl (B): <10 mg/dL (ref <10)

## 2021-05-23 LAB — PROTIME-INR
INR: 1.1 (ref 0.8–1.2)
Prothrombin Time: 13.7 seconds (ref 11.4–15.2)

## 2021-05-23 LAB — HEMOGLOBIN A1C
Hgb A1c MFr Bld: 8.6 % — ABNORMAL HIGH (ref 4.8–5.6)
Mean Plasma Glucose: 200.12 mg/dL

## 2021-05-23 MED ORDER — NICOTINE 21 MG/24HR TD PT24
21.0000 mg | MEDICATED_PATCH | Freq: Every day | TRANSDERMAL | Status: DC
  Administered 2021-05-23 – 2021-05-26 (×4): 21 mg via TRANSDERMAL
  Filled 2021-05-23 (×4): qty 1

## 2021-05-23 MED ORDER — ENOXAPARIN SODIUM 40 MG/0.4ML IJ SOSY
40.0000 mg | PREFILLED_SYRINGE | INTRAMUSCULAR | Status: DC
  Administered 2021-05-23 – 2021-05-25 (×3): 40 mg via SUBCUTANEOUS
  Filled 2021-05-23 (×3): qty 0.4

## 2021-05-23 MED ORDER — ASPIRIN 81 MG PO CHEW
324.0000 mg | CHEWABLE_TABLET | Freq: Once | ORAL | Status: AC
  Administered 2021-05-23: 324 mg via ORAL
  Filled 2021-05-23: qty 4

## 2021-05-23 MED ORDER — SODIUM CHLORIDE 0.9 % IV BOLUS
250.0000 mL | Freq: Once | INTRAVENOUS | Status: AC
  Administered 2021-05-23: 250 mL via INTRAVENOUS

## 2021-05-23 MED ORDER — SENNOSIDES-DOCUSATE SODIUM 8.6-50 MG PO TABS
1.0000 | ORAL_TABLET | Freq: Every evening | ORAL | Status: DC | PRN
  Administered 2021-05-24: 1 via ORAL
  Filled 2021-05-23: qty 1

## 2021-05-23 MED ORDER — IOHEXOL 350 MG/ML SOLN
75.0000 mL | Freq: Once | INTRAVENOUS | Status: AC | PRN
  Administered 2021-05-23: 75 mL via INTRAVENOUS

## 2021-05-23 MED ORDER — ASPIRIN 81 MG PO CHEW
81.0000 mg | CHEWABLE_TABLET | Freq: Every day | ORAL | Status: DC
  Administered 2021-05-24 – 2021-05-26 (×3): 81 mg via ORAL
  Filled 2021-05-23 (×3): qty 1

## 2021-05-23 MED ORDER — ACETAMINOPHEN 325 MG PO TABS
650.0000 mg | ORAL_TABLET | ORAL | Status: DC | PRN
  Administered 2021-05-24 – 2021-05-26 (×5): 650 mg via ORAL
  Filled 2021-05-23 (×6): qty 2

## 2021-05-23 MED ORDER — INSULIN ASPART 100 UNIT/ML IJ SOLN
0.0000 [IU] | Freq: Three times a day (TID) | INTRAMUSCULAR | Status: DC
  Administered 2021-05-24 (×2): 2 [IU] via SUBCUTANEOUS
  Administered 2021-05-24 – 2021-05-25 (×3): 1 [IU] via SUBCUTANEOUS

## 2021-05-23 MED ORDER — STROKE: EARLY STAGES OF RECOVERY BOOK: Freq: Once | Status: DC

## 2021-05-23 MED ORDER — ACETAMINOPHEN 650 MG RE SUPP: 650.0000 mg | RECTAL | Status: DC | PRN

## 2021-05-23 MED ORDER — ACETAMINOPHEN 160 MG/5ML PO SOLN: 650.0000 mg | ORAL | Status: DC | PRN

## 2021-05-23 NOTE — H&P (Addendum)
History and Physical    Stacey Lucas XYD:[id number] DOB: September 14, 1975 DOA: 05/23/2021  PCP: Fanny Bien, MD Consultants:   Patient coming from:  Home - lives with husband and daughter   Chief Complaint: left sided weakness, dizziness and speech deficits.   HPI: Stacey Lucas is a 45 y.o. female with medical history significant of HTN, hyperlipidemia, T2DM, depression who presented to ED with stroke like symptoms of left sided weakness, dizziness and speech deficits. She also states she had some  numbness during this episode. She states she was waiting on [employer] and fell this AM around 9:30 or 10:00. There was no loss of consciousness. She doesn't really remember anything that happened. Her fiance got her up and called EMS.  Her fiance states her speech was slurred and she was confused. She couldn't tell EMS when her birthday was or what day it was. She was unable to move her left leg. Her left arm was weak, but she could use it. Denies any facial drooping.   She does have frequent falls due to her knees giving out and has been going to therapy.   She denies any fever/chills, shortness of breath, coughing, chest pain, palpitations. She denies any vomiting or diarrhea. Has had some nausea this AM. She has had no lower leg swelling. She has chronic right knee pain due to a torn ligament.  Denies any double vision, blurry vision, dysuria, frequency or urgency.   LKW: around 9:30am.  She does smoke. About 1PPD.   ED Course: vitals: Afebrile, blood pressure 151/79, heart rate 119, respiratory rate 18, oxygen 100% on room air.   Pertinent labs: WBC 12.1, A1c 8.6, creatinine 1.17, I-stat quant: 20.2,  CT head: New right caudate infarct, age indeterminate but potentially acute.  New chronic left basal ganglia infarct.  CTA head and neck: No large vessel occlusion or significant stenosis in the head or neck.  1.8 cm right thyroid nodule.  MRI brain pending.  Code stroke called in ER.  Patient given 324  mg of aspirin.  Neurology consulted and following.  We were asked to admit.  Review of Systems: As per HPI; otherwise review of systems reviewed and negative.   Ambulatory Status:  Ambulates with cane at home    Past Medical History:  Diagnosis Date   Anxiety and depression    Diabetes mellitus without complication (HCC)    Type II   GERD (gastroesophageal reflux disease)    Hyperlipidemia    Hypertension    Impingement syndrome of right shoulder    Migraine    Pyelonephritis    Sciatica     Past Surgical History:  Procedure Laterality Date   tubal      Social History   Socioeconomic History   Marital status: Single    Spouse name: Not on file   Number of children: 3   Years of education: Not on file   Highest education level: Not on file  Occupational History   Not on file  Tobacco Use   Smoking status: Every Day    Packs/day: 1.00    Types: Cigarettes   Smokeless tobacco: Never  Vaping Use   Vaping Use: Never used  Substance and Sexual Activity   Alcohol use: Yes    Comment: occasionally   Drug use: No   Sexual activity: Yes    Birth control/protection: None  Other Topics Concern   Not on file  Social History Narrative   Lives with children   "lots of  caffeine"   Social Determinants of Health   Financial Resource Strain: Not on file  Food Insecurity: Not on file  Transportation Needs: Not on file  Physical Activity: Not on file  Stress: Not on file  Social Connections: Not on file  Intimate Partner Violence: Not on file    No Known Allergies  Family History  Problem Relation Age of Onset   Diabetes Mother    Hypertension Mother    Arthritis Mother    Diabetes Father    Hypertension Father     Prior to Admission medications   Medication Sig Start Date End Date Taking? Authorizing Provider  blood glucose meter kit and supplies KIT Check fasting blood sugar in the morning. 11/27/17   Jaynee Eagles, PA-C  furosemide (LASIX) 20 MG tablet Take  1 tablet (20 mg total) by mouth daily for 7 days. 02/17/21 02/24/21  Fatima Blank, MD  gabapentin (NEURONTIN) 300 MG capsule Take 1 capsule (300 mg total) by mouth 3 (three) times daily. Patient not taking: Reported on 10/30/2020 11/05/18   Nat Christen, MD  Insulin Glargine [facility]) 100 UNIT/ML [facility]  06/14/19   [provider]  Insulin Pen Needle (PEN NEEDLES) 32G X 4 MM MISC 1 each by Does not apply route at bedtime. 01/30/18   Jaynee Eagles, PA-C  lisinopril (PRINIVIL,ZESTRIL) 5 MG tablet Take 1 tablet (5 mg total) by mouth daily. Patient not taking: Reported on 10/30/2020 04/27/18   Mordecai Maes, NP  OZEMPIC, 0.25 OR 0.5 MG/DOSE, 2 MG/1.5ML SOPN INJECT 0.25MG SQ WEEKLY 06/11/19   [provider]    Physical Exam: Vitals:   05/23/21 1230 05/23/21 1245 05/23/21 1300 05/23/21 1315  BP: (!) 158/91 (!) 155/83 (!) 154/85 (!) 141/79  Pulse: (!) 115   (!) 110  Resp: '15 16 13 13  ' Temp:      TempSrc:      SpO2: 100%   100%  Weight:      Height:         General:  Appears calm and comfortable and is in NAD Eyes:  PERRL, EOMI, normal lids, iris ENT:  grossly normal hearing, lips & tongue, mmm; appropriate dentition Neck:  no LAD, masses or thyromegaly; no carotid bruits Cardiovascular:  RRR, no m/r/g. No LE edema.  Respiratory:   CTA bilaterally with no wheezes/rales/rhonchi.  Normal respiratory effort. Abdomen:  soft, NT, ND, NABS Back:   normal alignment, no CVAT Skin:  no rash or induration seen on limited exam Musculoskeletal:  grossly normal tone BUE/BLE. She has 5/5 strength in right upper extremity. RLE varies, but likely due to pain in her knee. Her LUE was weaker than right, 4/5. Left lower extremity was 5/5, good ROM, no bony abnormality Lower extremity:  No LE edema.  Limited foot exam with no ulcerations.  2+ distal pulses. Psychiatric:  grossly normal mood and affect, speech fluent and appropriate, AOx3 Neurologic:  CN 2-12 grossly intact, moves  all extremities in coordinated fashion, sensation intact. HKS intact bilaterally. FTN intact bilaterally. Gait deferred.     Radiological Exams on Admission: Independently reviewed - see discussion in A/P where applicable  CT HEAD CODE STROKE WO CONTRAST  Result Date: 05/23/2021 CLINICAL DATA:  Code stroke. Neuro deficit, acute, stroke suspected. Left-sided weakness. EXAM: CT HEAD WITHOUT CONTRAST TECHNIQUE: Contiguous axial images were obtained from the base of the skull through the vertex without intravenous contrast. COMPARISON:  Head MRI 04/14/2020 FINDINGS: Brain: A lacunar infarct in the right caudate head  is new and of indeterminate acuity. There is a new chronic infarct involving the left basal ganglia and corona radiata. Hypodensities elsewhere in the cerebral white matter bilaterally are nonspecific but likely reflect chronic small-vessel ischemia given vascular risk factors. No acute intracranial hemorrhage, mass, midline shift, or extra-axial fluid collection is identified. Vascular: Calcified atherosclerosis at the skull base. No hyperdense vessel. Skull: No acute fracture or suspicious osseous lesion. Sinuses/Orbits: Remote medial left orbital blowout fracture. Moderately extensive right greater than left ethmoid air cell opacification. Circumferential mucosal thickening and secretions in the maxillary sinuses. Clear mastoid air cells. Other: None. ASPECTS ([place] Stroke Program Early CT Score) - Ganglionic level infarction (caudate, lentiform nuclei, internal capsule, insula, M1-M3 cortex): 6 - Supraganglionic infarction (M4-M6 cortex): 3 Total score (0-10 with 10 being normal): 9 IMPRESSION: 1. New right caudate infarct, age indeterminate but potentially acute. ASPECTS of 9. 2. New chronic left basal ganglia infarct. 3. No acute intracranial hemorrhage. These results were communicated to Dr. Theda Sers at 11:44 am on 05/23/2021 by text page via the [facility] messaging system. Electronically Signed    By: Logan Bores M.D.   On: 05/23/2021 11:46   CT ANGIO HEAD NECK W WO CM (CODE STROKE)  Result Date: 05/23/2021 CLINICAL DATA:  Neuro deficit, acute, stroke suspected. Left-sided weakness. EXAM: CT ANGIOGRAPHY HEAD AND NECK TECHNIQUE: Multidetector CT imaging of the head and neck was performed using the standard protocol during bolus administration of intravenous contrast. Multiplanar CT image reconstructions and MIPs were obtained to evaluate the vascular anatomy. Carotid stenosis measurements (when applicable) are obtained utilizing NASCET criteria, using the distal internal carotid diameter as the denominator. CONTRAST:  14m OMNIPAQUE IOHEXOL 350 MG/ML SOLN COMPARISON:  None. FINDINGS: CTA NECK FINDINGS Aortic arch: Standard 3 vessel aortic arch with mild atherosclerotic plaque. Widely patent arch vessel origins. Right carotid system: Patent without evidence of stenosis, dissection, or significant atherosclerosis. Left carotid system: Patent without evidence of stenosis, dissection, or significant atherosclerosis. Vertebral arteries: Patent and codominant without evidence of stenosis, dissection, or significant atherosclerosis. Skeleton: Moderate cervical disc degeneration. Other neck: 1.8 cm complex right thyroid nodule. Upper chest: No apical lung consolidation or mass. Review of the MIP images confirms the above findings CTA HEAD FINDINGS Anterior circulation: The internal carotid arteries are widely patent from skull base to carotid termini. ACAs and MCAs are patent without evidence of a proximal branch occlusion or significant proximal stenosis. No aneurysm is identified. Posterior circulation: The intracranial vertebral arteries are widely patent to the basilar. Patent AICA and SCA origins are identified bilaterally. The basilar artery is widely patent. Posterior communicating arteries are diminutive or absent. Both PCAs are patent without evidence of a significant proximal stenosis. No aneurysm is  identified. Venous sinuses: Patent. Anatomic variants: None. Review of the MIP images confirms the above findings IMPRESSION: 1. No large vessel occlusion or significant stenosis in the head or neck. 2. 1.8 cm right thyroid nodule. Recommend thyroid [place](ref: J Am Coll Radiol. 2015 Feb;12(2): 143-50). 3.  Aortic Atherosclerosis (ICD10-I70.0). These results were communicated to Dr. CTheda Sersat 11:44 am on 05/23/2021 by text page via the [facility] system. Electronically Signed   By: ALogan BoresM.D.   On: 05/23/2021 11:54    EKG: Independently reviewed.  Sinus tachycardia with rate 121; nonspecific ST changes with no evidence of acute ischemia   Labs on Admission: I have personally reviewed the available labs and imaging studies at the time of the admission.  Pertinent labs:  WBC 12.1,  A1c 8.6,  creatinine 1.17,  I-stat quant: 20.2, (urine hcg pending. Has had BTL) Potassium: 6.4 on istat--> cmp: 4.8. no intervention   Assessment/Plan Principal Problem:   Left sided weakness/acute CVA -46 year old female with left-sided weakness, dizziness and speech deficits found to have age indeterminate CVA on CTH. Presenting symptoms resolved on arrival to ED. Will place in observation for stroke work up.  -place in obs on telemetry  -Neurology consulted and following -Frequent neurochecks per protocol -Echocardiogram pending MRI brain pending -Aspirin therapy started -A1c has resulted and lipid panel pending -OT/ST/PT -maximize medical therapy and encouraged smoking cessation.   Active Problems:   Type 2 diabetes mellitus without complication (HCC) -N0U of 8.6.  Still uncontrolled but has improved from 12.7 6 months ago -Continue her home Basaglar and hold her Ozempic -Start sliding scale insulin and Accu-Cheks per protocol -enCouraged her to continue working on her diabetes as she has made great progress but would like this to be more tightly controlled in setting of possible acute stroke  and old CVA on imaging.  -diabetic education ordered  Elevated creatinine Does not by definition meet AKI Will give small fluid bolus and encourage p.o. intake once passes swallow screen Follow renal function     HTN (hypertension) -Allow permissive hypertension for 24 to 48 hours in setting of possible acute stroke -if MRI negative, will resume home meds, but will hold home antihypertensives and as needed parameters ordered    Hyperlipidemia -Lipid panel pending, continue crestor  -will need statin adjusted if LDL >70.   Thyroid nodule 1.8cm thyroid nodule incidentally noted on imaging  Recommend outpatient thyroid ultrasound  Tobacco abuse Nicotine patch Encouraged cessation   Body mass index is 25.09 kg/m.    Level of care: Telemetry Medical DVT prophylaxis:  Lovenox Code Status:  Full - confirmed with patient Family Communication: Vanita Panda present at bedside (fiance) Disposition Plan:  The patient is from: home  Anticipated d/c is to: home per day team dispo Requires inpatient hospitalization and is at significant risk of neurological worsening, requires constant monitoring, assessment and MDM with specialists.  Consults called: neurology   Admission status:  observation   Dragon dictation used in completing this note.    Orma Flaming MD [facility]   How to contact the [facility] Attending or Consulting provider [place] or covering provider during after hours [place], for this patient?  Check the care team in [facility] and look for a) attending/consulting [facility] provider listed and b) the [facility] team listed Log into [email or web address] and use [place]'s universal password to access. If you do not have the password, please contact the hospital operator. Locate the [facility] provider you are looking for under [facility] and page to a number that you can be directly reached. If you still have difficulty reaching the provider, please page the [facility] (Director on Call) for  the Hospitalists listed on amion for assistance.   05/23/2021, 1:52 PM

## 2021-05-23 NOTE — Consult Note (Signed)
Neurology Consultation  Reason for Consult: Left-sided weakness Referring Physician: Dr. Tyrone Nine  CC: Acute onset of left-sided weakness affecting her ambulation  History is obtained from: Patient, chart review  HPI: Stacey Lucas is a 45 y.o. female with a medical history significant for type 2 diabetes mellitus, essential hypertension, hyperlipidemia, migraine headaches, and sciatica who presented to the ED today from home via EMS for evaluation of acute onset of left-sided weakness with onset at 10:00 AM. She states that she woke up this morning walking without difficulty and at 10:00 she noticed left-sided weakness affecting her gait. She also has associated nausea and dizziness. Patient also endorses that she falls a lot at home but has not fallen today and on chart review, she has been evaluated for right lower extremity neuropathy. Her initial labs reveal hyperkalemia with a potassium of 6.4 and she notes that she did have diarrhea yesterday and last night twice.   LKW: 10:00 TNK given?: no, presentation is not felt to be consistent with acute stroke. Infarction identified on imaging as probably acute is felt to be age indeterminate and a completed stroke on MD review. Symptoms are too mild to treat and do not localize to the CT identified lesion.  IR Thrombectomy? No, presentation is not consistent with LVO, vessel imaging without evidence of LVO Modified Rankin Scale: 0-Completely asymptomatic and back to baseline post- stroke  ROS: A complete ROS was performed and is negative except as noted in the HPI.   Past Medical History:  Diagnosis Date   Anxiety and depression    Diabetes mellitus without complication (HCC)    Type II   GERD (gastroesophageal reflux disease)    Hyperlipidemia    Hypertension    Impingement syndrome of right shoulder    Migraine    Pyelonephritis    Sciatica    Past Surgical History:  Procedure Laterality Date   tubal     Family History  Problem  Relation Age of Onset   Diabetes Mother    Hypertension Mother    Arthritis Mother    Diabetes Father    Hypertension Father    Social History:   reports that she has been smoking cigarettes. She has been smoking an average of 1 pack per day. She has never used smokeless tobacco. She reports current alcohol use. She reports that she does not use drugs.  Medications  Current Facility-Administered Medications:    [COMPLETED] aspirin chewable tablet 324 mg, 324 mg, Oral, Once, 324 mg at 05/23/21 1207 **FOLLOWED BY** [START ON 05/24/2021] aspirin chewable tablet 81 mg, 81 mg, Oral, Daily, Moncerrath Berhe, Unk Pinto, MD  Current Outpatient Medications:    blood glucose meter kit and supplies KIT, Check fasting blood sugar in the morning., Disp: 1 each, Rfl: 0   furosemide (LASIX) 20 MG tablet, Take 1 tablet (20 mg total) by mouth daily for 7 days., Disp: 7 tablet, Rfl: 0   gabapentin (NEURONTIN) 300 MG capsule, Take 1 capsule (300 mg total) by mouth 3 (three) times daily. (Patient not taking: Reported on 10/30/2020), Disp: 90 capsule, Rfl: 0   Insulin Glargine (BASAGLAR KWIKPEN) 100 UNIT/ML SOPN, , Disp: , Rfl:    Insulin Pen Needle (PEN NEEDLES) 32G X 4 MM MISC, 1 each by Does not apply route at bedtime., Disp: 100 each, Rfl: 3   lisinopril (PRINIVIL,ZESTRIL) 5 MG tablet, Take 1 tablet (5 mg total) by mouth daily. (Patient not taking: Reported on 10/30/2020), Disp: 30 tablet, Rfl: 0   OZEMPIC, 0.25  OR 0.5 MG/DOSE, 2 MG/1.5ML SOPN, INJECT 0.25MG SQ WEEKLY, Disp: , Rfl:   Exam: Current vital signs: BP (!) 158/91   Pulse (!) 115   Temp 98.7 F (37.1 C) (Oral)   Resp 15   Ht '5\' 5"'  (1.651 m)   Wt 68.4 kg   SpO2 100%   BMI 25.09 kg/m  Vital signs in last 24 hours: Temp:  [98.7 F (37.1 C)] 98.7 F (37.1 C) (09/14 1150) Pulse Rate:  [115-119] 115 (09/14 1230) Resp:  [15-18] 15 (09/14 1230) BP: (144-158)/(59-91) 158/91 (09/14 1230) SpO2:  [100 %] 100 % (09/14 1230) Weight:  [68.4 kg] 68.4 kg  (09/14 1100)  GENERAL: Awake, alert, in no acute distress Psych: Affect appropriate for situation, patient is calm and cooperative with examination Head: Normocephalic and atraumatic, without obvious abnormality EENT: Normal conjunctivae, dry mucous membranes, no OP obstruction LUNGS: Normal respiratory effort. Non-labored breathing on room air CV: Tachycardia, sinus rhythm on cardiac monitor ABDOMEN: Soft, non-tender, non-distended Extremities: warm, well perfused, without obvious deformity  NEURO:  Mental Status: Awake, alert, and oriented to person, place, time, and situation. She is able to provide a clear and coherent history of present illness. Speech/Language: speech is intact without dysarthria. Naming, repetition, fluency, and comprehension intact without aphasia No neglect is noted Cranial Nerves:  II: PERRL 4 mm/brisk. Visual fields full.  III, IV, VI: EOM without ptosis. No nystagmus noted.  V: Sensation is intact to light touch and symmetrical to face. VII: Face is symmetric resting and smiling.  VIII: Hearing is intact to voice IX, X: Palate elevation is symmetric. Phonation normal.  XI: Normal sternocleidomastoid and trapezius muscle strength XII: Tongue protrudes midline without fasciculations.   Motor: 5/5 strength in bilateral upper extremities Bilateral lower extremities with inconsistent strength throughout assessment. At times, her left lower extremity is able to withstand antigravity movement without vertical drift, other times she has a mild drift to immediate drift to bed. Right lower extremity has minimal drift to immediate drift to bed.  Tone is normal. Bulk is normal.  Sensation: Intact to light touch bilaterally in all four extremities.  Coordination: FTN intact bilaterally. HKS intact bilaterally.  DTRs: 1+ and symmetric patellae, 2+ and symmetric biceps Plantar: Toes downgoing bilaterally Gait: Deferred  NIHSS: 1a Level of Conscious.: 0 1b LOC  Questions: 0 1c LOC Commands: 0 2 Best Gaze: 0 3 Visual: 0 4 Facial Palsy: 0 5a Motor Arm - left: 0 5b Motor Arm - Right: 0 6a Motor Leg - Left: 1 6b Motor Leg - Right: 1 7 Limb Ataxia: 0 8 Sensory: 0 9 Best Language: 0 10 Dysarthria: 0 11 Extinct. and Inatten.: 0 TOTAL: 2  Labs I have reviewed labs in epic and the results pertinent to this consultation are: CBC    Component Value Date/Time   WBC 12.1 (H) 05/23/2021 1130   RBC 5.29 (H) 05/23/2021 1130   HGB 14.3 05/23/2021 1133   HCT 42.0 05/23/2021 1133   PLT 207 05/23/2021 1130   MCV 78.6 (L) 05/23/2021 1130   MCH 26.7 05/23/2021 1130   MCHC 33.9 05/23/2021 1130   RDW 15.6 (H) 05/23/2021 1130   LYMPHSABS 2.9 05/23/2021 1130   MONOABS 1.1 (H) 05/23/2021 1130   EOSABS 0.1 05/23/2021 1130   BASOSABS 0.1 05/23/2021 1130   CMP     Component Value Date/Time   NA 136 05/23/2021 1133   NA 127 (L) 11/27/2017 1708   K 6.4 (HH) 05/23/2021 1133   CL 107  05/23/2021 1133   CO2 23 02/16/2021 2158   GLUCOSE 122 (H) 05/23/2021 1133   BUN 16 05/23/2021 1133   BUN 12 11/27/2017 1708   CREATININE 1.00 05/23/2021 1133   CALCIUM 8.8 (L) 02/16/2021 2158   PROT 6.0 (L) 02/16/2021 2047   PROT 6.6 11/27/2017 1708   ALBUMIN 2.3 (L) 02/16/2021 2047   ALBUMIN 4.2 11/27/2017 1708   AST 16 02/16/2021 2047   ALT 10 02/16/2021 2047   ALKPHOS 81 02/16/2021 2047   BILITOT 0.9 02/16/2021 2047   BILITOT <0.2 11/27/2017 1708   GFRNONAA >60 02/16/2021 2158   GFRAA >60 04/14/2020 1335   Lipid Panel  No results found for: CHOL, TRIG, HDL, CHOLHDL, VLDL, LDLCALC, LDLDIRECT  Lab Results  Component Value Date   HGBA1C 12.7 (H) 10/30/2020   Imaging I have reviewed the images obtained:  CT-scan of the brain 9/14: 1. New right caudate infarct, age indeterminate but potentially acute. ASPECTS of 9. 2. New chronic left basal ganglia infarct. 3. No acute intracranial hemorrhage.  CT angio head and neck 9/14: 1. No large vessel occlusion  or significant stenosis in the head or neck. 2. 1.8 cm right thyroid nodule. Recommend thyroid US (ref: J Am Coll Radiol. 2015 Feb;12(2): 143-50). 3.  Aortic Atherosclerosis (ICD10-I70.0).  Assessment: 45 y.o. female who presented to the ED for evaluation of left-sided weakness affecting her gait at home. On arrival, she did not have unilateral weakness on examination.  - Examination reveals patient with varying degrees of weakness of bilateral lower extremities with an initial NIHSS of 2.  - CT imaging revealed an age indeterminate right caudate infarct, likely completed infarction with stable symptoms on arrival with an ASPECTS of 9 and a new chronic left basal ganglia infarct and no LVO or significant stenosis in the head or neck. - Presentation may be due to hyperkalemia, however, with imaging evidence of chronic and age indeterminate strokes without patient known history of such, will complete stroke work up for stroke prophylaxis. Presenting symptoms resolved on arrival and do not localize with CT identified right caudate infarction. TNK not administered due to age indeterminate infarction and mild, non-focal symptoms too mild to treat.   Recommendations: - HgbA1c, fasting lipid panel - MRI of the brain without contrast - Frequent neuro checks - Echocardiogram - Prophylactic therapy- Antiplatelet med: Aspirin - dose 339m PO or 3019mPR with ASA 81 mg PO daily - Risk factor modification - Telemetry monitoring - PT/OT evaluation  - Management of electrolytes per ED/primary team  Pt seen by NP/Neuro and later by MD. Note/plan to be edited by MD as needed.  StAnibal HendersonAGAC-NP [facility] Pager: [phone] seen, examined, labs,vitals and notes reviewed. Discussed plan with StAnibal HendersonNP and agree with assessment and plan as documented above. I have independently reviewed the chart, obtained history, review of systems and examined the  patient.  Electronically signed by:  HuLynnae SandhoffMD Page: [phone]/14/2022, 8:16 PM

## 2021-05-23 NOTE — ED Notes (Signed)
Patient transported to MRI 

## 2021-05-23 NOTE — ED Notes (Signed)
Pt given turkey sandwich and water

## 2021-05-23 NOTE — Code Documentation (Signed)
Stroke Response Nurse Documentation Code Documentation  Stacey Lucas is a 45 y.o. female arriving to [facility] ED via [facility] on 05-23-2021 with past medical hx of HTN and DM. On No antithrombotic. Code stroke was activated by EMS.   Patient from home where she was LKW at 1000 and now complaining of Left side weakness . This morning she started complaining of feeling dizzy and nauseated and she had difficulty ambulating.  Upon EMS assessment she had Left arm and leg weakness.   Stroke team at the bedside on patient arrival. Labs drawn and patient cleared for CT by Dr. Adela Lank. Patient to CT with team. NIHSS 2, see documentation for details and code stroke times. Patient with bilateral leg weakness on exam. The following imaging was completed:  CT, CTA head and neck,  Patient is not a candidate for IV Thrombolytic due to mild and improving deficits.  No focal deficit upon arrival. Patient is not a candidate for IR due to no LVO on CTA per radiology.   Care/Plan: Neuro checks and VS q 2 Hours..   Bedside handoff with ED RN Sheria Lang.    Marcellina Millin  Stroke Response RN

## 2021-05-23 NOTE — ED Provider Notes (Signed)
[facility] EMERGENCY DEPARTMENT Provider Note   CSN: [id number] Arrival date & time: 05/23/21  1123  An emergency department physician performed an initial assessment on this suspected stroke patient at 1125.  History Chief Complaint  Patient presents with   Code Stroke    Stacey Lucas is a 45 y.o. female.  46 yo F with a cc of acute confusion, difficulty speaking and dizziness.   The history is provided by the patient.  Illness Severity:  Moderate Onset quality:  Gradual Duration:  2 hours Timing:  Constant Progression:  Unchanged Chronicity:  New Associated symptoms: no chest pain, no congestion, no fever, no headaches, no myalgias, no nausea, no rhinorrhea, no shortness of breath, no vomiting and no wheezing       Past Medical History:  Diagnosis Date   Anxiety and depression    Diabetes mellitus without complication (HCC)    Type II   GERD (gastroesophageal reflux disease)    Hyperlipidemia    Hypertension    Impingement syndrome of right shoulder    Migraine    Pyelonephritis    Sciatica     Patient Active Problem List   Diagnosis Date Noted   Type 2 diabetes mellitus without complication ([place]) 40/98/1191   CVA (cerebral vascular accident) ([place]) 05/23/2021   HTN (hypertension) 05/23/2021   Hyperlipidemia 05/23/2021   Thyroid nodule 05/23/2021   MDD (major depressive disorder), recurrent severe, without psychosis ([place]) 04/27/2018   MDD (major depressive disorder), severe ([place]) 04/25/2018    Past Surgical History:  Procedure Laterality Date   tubal       OB History   No obstetric history on file.     Family History  Problem Relation Age of Onset   Diabetes Mother    Hypertension Mother    Arthritis Mother    Diabetes Father    Hypertension Father     Social History   Tobacco Use   Smoking status: Every Day    Packs/day: 1.00    Types: Cigarettes   Smokeless tobacco: Never  Vaping Use   Vaping Use: Never used   Substance Use Topics   Alcohol use: Yes    Comment: occasionally   Drug use: No    Home Medications Prior to Admission medications   Medication Sig Start Date End Date Taking? Authorizing Provider  blood glucose meter kit and supplies KIT Check fasting blood sugar in the morning. 11/27/17   Jaynee Eagles, PA-C  furosemide (LASIX) 20 MG tablet Take 1 tablet (20 mg total) by mouth daily for 7 days. 02/17/21 02/24/21  Fatima Blank, MD  gabapentin (NEURONTIN) 300 MG capsule Take 1 capsule (300 mg total) by mouth 3 (three) times daily. Patient not taking: Reported on 10/30/2020 11/05/18   Nat Christen, MD  Insulin Glargine [facility]) 100 UNIT/ML [facility]  06/14/19   [provider]  Insulin Pen Needle (PEN NEEDLES) 32G X 4 MM MISC 1 each by Does not apply route at bedtime. 01/30/18   Jaynee Eagles, PA-C  lisinopril (PRINIVIL,ZESTRIL) 5 MG tablet Take 1 tablet (5 mg total) by mouth daily. Patient not taking: Reported on 10/30/2020 04/27/18   Mordecai Maes, NP  OZEMPIC, 0.25 OR 0.5 MG/DOSE, 2 MG/1.5ML SOPN INJECT 0.25MG SQ WEEKLY 06/11/19   [provider]    Allergies    Patient has no known allergies.  Review of Systems   Review of Systems  Constitutional:  Negative for chills and fever.  HENT:  Negative for congestion  and rhinorrhea.   Eyes:  Negative for redness and visual disturbance.  Respiratory:  Negative for shortness of breath and wheezing.   Cardiovascular:  Negative for chest pain and palpitations.  Gastrointestinal:  Negative for nausea and vomiting.  Genitourinary:  Negative for dysuria and urgency.  Musculoskeletal:  Negative for arthralgias and myalgias.  Skin:  Negative for pallor and wound.  Neurological:  Positive for speech difficulty and weakness. Negative for dizziness and headaches.   Physical Exam Updated Vital Signs BP (!) 141/79   Pulse (!) 110   Temp 98.7 F (37.1 C) (Oral)   Resp 13   Ht '5\' 5"'  (1.651 m)   Wt 68.4 kg   SpO2 100%    BMI 25.09 kg/m   Physical Exam Vitals and nursing note reviewed.  Constitutional:      General: She is not in acute distress.    Appearance: She is well-developed. She is not diaphoretic.  HENT:     Head: Normocephalic and atraumatic.  Eyes:     Pupils: Pupils are equal, round, and reactive to light.  Cardiovascular:     Rate and Rhythm: Normal rate and regular rhythm.     Heart sounds: No murmur heard.   No friction rub. No gallop.  Pulmonary:     Effort: Pulmonary effort is normal.     Breath sounds: No wheezing or rales.  Abdominal:     General: There is no distension.     Palpations: Abdomen is soft.     Tenderness: There is no abdominal tenderness.  Musculoskeletal:        General: No tenderness.     Cervical back: Normal range of motion and neck supple.  Skin:    General: Skin is warm and dry.  Neurological:     Mental Status: She is alert and oriented to person, place, and time.     Comments: L sided weakness  Psychiatric:        Behavior: Behavior normal.    ED Results / Procedures / Treatments   Labs (all labs ordered are listed, but only abnormal results are displayed) Labs Reviewed  CBC - Abnormal; Notable for the following components:      Result Value   WBC 12.1 (*)    RBC 5.29 (*)    MCV 78.6 (*)    RDW 15.6 (*)    All other components within normal limits  DIFFERENTIAL - Abnormal; Notable for the following components:   Neutro Abs 7.9 (*)    Monocytes Absolute 1.1 (*)    All other components within normal limits  COMPREHENSIVE METABOLIC PANEL - Abnormal; Notable for the following components:   Glucose, Bld 122 (*)    Creatinine, Ser 1.17 (*)    Calcium 8.1 (*)    Total Protein 4.9 (*)    Albumin 1.3 (*)    Total Bilirubin 1.5 (*)    GFR, Estimated 59 (*)    All other components within normal limits  APTT - Abnormal; Notable for the following components:   aPTT 38 (*)    All other components within normal limits  HEMOGLOBIN A1C - Abnormal;  Notable for the following components:   Hgb A1c MFr Bld 8.6 (*)    All other components within normal limits  I-STAT CHEM 8, ED - Abnormal; Notable for the following components:   Potassium 6.4 (*)    Glucose, Bld 122 (*)    Calcium, Ion 1.00 (*)    All other components within normal  limits  I-STAT BETA HCG BLOOD, ED (MC, WL, AP ONLY) - Abnormal; Notable for the following components:   I-stat hCG, quantitative 20.2 (*)    All other components within normal limits  CBG MONITORING, ED - Abnormal; Notable for the following components:   Glucose-Capillary 120 (*)    All other components within normal limits  RESP PANEL BY RT-PCR (FLU A&B, COVID) ARPGX2  ETHANOL  PROTIME-INR  RAPID URINE DRUG SCREEN, HOSP PERFORMED  URINALYSIS, ROUTINE W REFLEX MICROSCOPIC  LIPID PANEL  HIV ANTIBODY (ROUTINE TESTING W REFLEX)  PREGNANCY, URINE    EKG None  Radiology CT HEAD CODE STROKE WO CONTRAST  Result Date: 05/23/2021 CLINICAL DATA:  Code stroke. Neuro deficit, acute, stroke suspected. Left-sided weakness. EXAM: CT HEAD WITHOUT CONTRAST TECHNIQUE: Contiguous axial images were obtained from the base of the skull through the vertex without intravenous contrast. COMPARISON:  Head MRI 04/14/2020 FINDINGS: Brain: A lacunar infarct in the right caudate head is new and of indeterminate acuity. There is a new chronic infarct involving the left basal ganglia and corona radiata. Hypodensities elsewhere in the cerebral white matter bilaterally are nonspecific but likely reflect chronic small-vessel ischemia given vascular risk factors. No acute intracranial hemorrhage, mass, midline shift, or extra-axial fluid collection is identified. Vascular: Calcified atherosclerosis at the skull base. No hyperdense vessel. Skull: No acute fracture or suspicious osseous lesion. Sinuses/Orbits: Remote medial left orbital blowout fracture. Moderately extensive right greater than left ethmoid air cell opacification.  Circumferential mucosal thickening and secretions in the maxillary sinuses. Clear mastoid air cells. Other: None. ASPECTS ([place] Stroke Program Early CT Score) - Ganglionic level infarction (caudate, lentiform nuclei, internal capsule, insula, M1-M3 cortex): 6 - Supraganglionic infarction (M4-M6 cortex): 3 Total score (0-10 with 10 being normal): 9 IMPRESSION: 1. New right caudate infarct, age indeterminate but potentially acute. ASPECTS of 9. 2. New chronic left basal ganglia infarct. 3. No acute intracranial hemorrhage. These results were communicated to Dr. Theda Sers at 11:44 am on 05/23/2021 by text page via the [facility] messaging system. Electronically Signed   By: Logan Bores M.D.   On: 05/23/2021 11:46   CT ANGIO HEAD NECK W WO CM (CODE STROKE)  Result Date: 05/23/2021 CLINICAL DATA:  Neuro deficit, acute, stroke suspected. Left-sided weakness. EXAM: CT ANGIOGRAPHY HEAD AND NECK TECHNIQUE: Multidetector CT imaging of the head and neck was performed using the standard protocol during bolus administration of intravenous contrast. Multiplanar CT image reconstructions and MIPs were obtained to evaluate the vascular anatomy. Carotid stenosis measurements (when applicable) are obtained utilizing NASCET criteria, using the distal internal carotid diameter as the denominator. CONTRAST:  60m OMNIPAQUE IOHEXOL 350 MG/ML SOLN COMPARISON:  None. FINDINGS: CTA NECK FINDINGS Aortic arch: Standard 3 vessel aortic arch with mild atherosclerotic plaque. Widely patent arch vessel origins. Right carotid system: Patent without evidence of stenosis, dissection, or significant atherosclerosis. Left carotid system: Patent without evidence of stenosis, dissection, or significant atherosclerosis. Vertebral arteries: Patent and codominant without evidence of stenosis, dissection, or significant atherosclerosis. Skeleton: Moderate cervical disc degeneration. Other neck: 1.8 cm complex right thyroid nodule. Upper chest: No apical lung  consolidation or mass. Review of the MIP images confirms the above findings CTA HEAD FINDINGS Anterior circulation: The internal carotid arteries are widely patent from skull base to carotid termini. ACAs and MCAs are patent without evidence of a proximal branch occlusion or significant proximal stenosis. No aneurysm is identified. Posterior circulation: The intracranial vertebral arteries are widely patent to the basilar. Patent AICA and SCA  origins are identified bilaterally. The basilar artery is widely patent. Posterior communicating arteries are diminutive or absent. Both PCAs are patent without evidence of a significant proximal stenosis. No aneurysm is identified. Venous sinuses: Patent. Anatomic variants: None. Review of the MIP images confirms the above findings IMPRESSION: 1. No large vessel occlusion or significant stenosis in the head or neck. 2. 1.8 cm right thyroid nodule. Recommend thyroid US (ref: J Am Coll Radiol. 2015 Feb;12(2): 143-50). 3.  Aortic Atherosclerosis (ICD10-I70.0). These results were communicated to Dr. Theda Sers at 11:44 am on 05/23/2021 by text page via the [facility] messaging system. Electronically Signed   By: Logan Bores M.D.   On: 05/23/2021 11:54    Procedures Procedures   Medications Ordered in ED Medications  aspirin chewable tablet 324 mg (324 mg Oral Given 05/23/21 1207)    Followed by  aspirin chewable tablet 81 mg (has no administration in time range)   stroke: mapping our early stages of recovery book (has no administration in time range)  acetaminophen (TYLENOL) tablet 650 mg (has no administration in time range)    Or  acetaminophen (TYLENOL) 160 MG/5ML solution 650 mg (has no administration in time range)    Or  acetaminophen (TYLENOL) suppository 650 mg (has no administration in time range)  senna-docusate (Senokot-S) tablet 1 tablet (has no administration in time range)  enoxaparin (LOVENOX) injection 40 mg (has no administration in time range)  iohexol  (OMNIPAQUE) 350 MG/ML injection 75 mL (75 mLs Intravenous Contrast Given 05/23/21 1143)    ED Course  I have reviewed the triage vital signs and the nursing notes.  Pertinent labs & imaging results that were available during my care of the patient were reviewed by me and considered in my medical decision making (see chart for details).    MDM Rules/Calculators/A&P                           45 yo F with a cc of acute dysphagia and dizziness.  Arrived as a code stroke.  Seen by neurology upon arrival.  Protecting airway and taken urgently back to CT scanner.  Thought to have a low NIH score and so was not given tPA.  No acute large vessel occlusion on CT angiogram.  Plan to obtain an MRI and admit to medicine.  The patients results and plan were reviewed and discussed.   Any x-rays performed were independently reviewed by myself.   Differential diagnosis were considered with the presenting HPI.  Medications  aspirin chewable tablet 324 mg (324 mg Oral Given 05/23/21 1207)    Followed by  aspirin chewable tablet 81 mg (has no administration in time range)   stroke: mapping our early stages of recovery book (has no administration in time range)  acetaminophen (TYLENOL) tablet 650 mg (has no administration in time range)    Or  acetaminophen (TYLENOL) 160 MG/5ML solution 650 mg (has no administration in time range)    Or  acetaminophen (TYLENOL) suppository 650 mg (has no administration in time range)  senna-docusate (Senokot-S) tablet 1 tablet (has no administration in time range)  enoxaparin (LOVENOX) injection 40 mg (has no administration in time range)  iohexol (OMNIPAQUE) 350 MG/ML injection 75 mL (75 mLs Intravenous Contrast Given 05/23/21 1143)    Vitals:   05/23/21 1230 05/23/21 1245 05/23/21 1300 05/23/21 1315  BP: (!) 158/91 (!) 155/83 (!) 154/85 (!) 141/79  Pulse: (!) 115   (!) 110  Resp:  '15 16 13 13  ' Temp:      TempSrc:      SpO2: 100%   100%  Weight:      Height:         Final diagnoses:  Acute ischemic stroke [facility])    Admission/ observation were discussed with the admitting physician, patient and/or family and they are comfortable with the plan.   Final Clinical Impression(s) / ED Diagnoses Final diagnoses:  Acute ischemic stroke [facility])    Rx / DC Orders ED Discharge Orders     None        Deno Etienne, DO 05/23/21 1403

## 2021-05-23 NOTE — ED Triage Notes (Signed)
Pt BIB [facility] for CODE STROKE. Pt had sudden onset L sided weakness of arm and leg at 1100. No hx of stroke. Hx of HTN, diabetes.   EMS VS- 117/62, HR 78, CBG 130, 100% SpO2

## 2021-05-24 ENCOUNTER — Encounter (HOSPITAL_COMMUNITY): Payer: Self-pay | Admitting: Family Medicine

## 2021-05-24 DIAGNOSIS — M1711 Unilateral primary osteoarthritis, right knee: Secondary | ICD-10-CM

## 2021-05-24 DIAGNOSIS — I6330 Cerebral infarction due to thrombosis of unspecified cerebral artery: Secondary | ICD-10-CM | POA: Insufficient documentation

## 2021-05-24 DIAGNOSIS — R531 Weakness: Secondary | ICD-10-CM | POA: Diagnosis not present

## 2021-05-24 HISTORY — DX: Cerebral infarction due to thrombosis of unspecified cerebral artery: I63.30

## 2021-05-24 HISTORY — DX: Unilateral primary osteoarthritis, right knee: M17.11

## 2021-05-24 LAB — URINALYSIS, ROUTINE W REFLEX MICROSCOPIC
Bilirubin Urine: NEGATIVE
Glucose, UA: 50 mg/dL — AB
Hgb urine dipstick: NEGATIVE
Ketones, ur: NEGATIVE mg/dL
Leukocytes,Ua: NEGATIVE
Nitrite: NEGATIVE
Protein, ur: >=300 mg/dL — AB
Specific Gravity, Urine: 1.018 (ref 1.005–1.030)
pH: 6 (ref 5.0–8.0)

## 2021-05-24 LAB — RAPID URINE DRUG SCREEN, HOSP PERFORMED
Amphetamines: NOT DETECTED
Barbiturates: NOT DETECTED
Benzodiazepines: NOT DETECTED
Cocaine: POSITIVE — AB
Opiates: NOT DETECTED
Tetrahydrocannabinol: NOT DETECTED

## 2021-05-24 LAB — BASIC METABOLIC PANEL
Anion gap: 12 (ref 5–15)
BUN: 13 mg/dL (ref 6–20)
CO2: 23 mmol/L (ref 22–32)
Calcium: 8.3 mg/dL — ABNORMAL LOW (ref 8.9–10.3)
Chloride: 102 mmol/L (ref 98–111)
Creatinine, Ser: 1.14 mg/dL — ABNORMAL HIGH (ref 0.44–1.00)
GFR, Estimated: >60 mL/min (ref >60)
Glucose, Bld: 119 mg/dL — ABNORMAL HIGH (ref 70–99)
Potassium: 3.7 mmol/L (ref 3.5–5.1)
Sodium: 137 mmol/L (ref 135–145)

## 2021-05-24 LAB — PREGNANCY, URINE: Preg Test, Ur: NEGATIVE

## 2021-05-24 LAB — GLUCOSE, CAPILLARY
Glucose-Capillary: 145 mg/dL — ABNORMAL HIGH (ref 70–99)
Glucose-Capillary: 170 mg/dL — ABNORMAL HIGH (ref 70–99)
Glucose-Capillary: 152 mg/dL — ABNORMAL HIGH (ref 70–99)
Glucose-Capillary: 74 mg/dL (ref 70–99)

## 2021-05-24 MED ORDER — CLOPIDOGREL BISULFATE 75 MG PO TABS
300.0000 mg | ORAL_TABLET | Freq: Once | ORAL | Status: AC
  Administered 2021-05-24: 300 mg via ORAL
  Filled 2021-05-24: qty 4

## 2021-05-24 MED ORDER — INSULIN GLARGINE-YFGN 100 UNIT/ML ~~LOC~~ SOLN
20.0000 [IU] | Freq: Every day | SUBCUTANEOUS | Status: DC
  Administered 2021-05-24 – 2021-05-25 (×2): 20 [IU] via SUBCUTANEOUS
  Filled 2021-05-24 (×2): qty 0.2

## 2021-05-24 MED ORDER — DICLOFENAC SODIUM 1 % EX GEL
4.0000 g | Freq: Two times a day (BID) | CUTANEOUS | Status: DC | PRN
  Filled 2021-05-24: qty 100

## 2021-05-24 MED ORDER — LIVING WELL WITH DIABETES BOOK
Freq: Once | Status: DC
  Filled 2021-05-24: qty 1

## 2021-05-24 MED ORDER — ROSUVASTATIN CALCIUM 5 MG PO TABS
10.0000 mg | ORAL_TABLET | Freq: Every day | ORAL | Status: DC
  Administered 2021-05-24 – 2021-05-25 (×2): 10 mg via ORAL
  Filled 2021-05-24 (×2): qty 2

## 2021-05-24 MED ORDER — SENNOSIDES-DOCUSATE SODIUM 8.6-50 MG PO TABS
1.0000 | ORAL_TABLET | Freq: Two times a day (BID) | ORAL | Status: DC
  Administered 2021-05-24 – 2021-05-26 (×4): 1 via ORAL
  Filled 2021-05-24 (×4): qty 1

## 2021-05-24 MED ORDER — CLOPIDOGREL BISULFATE 75 MG PO TABS
75.0000 mg | ORAL_TABLET | Freq: Every day | ORAL | Status: DC
  Administered 2021-05-25 – 2021-05-26 (×2): 75 mg via ORAL
  Filled 2021-05-24 (×2): qty 1

## 2021-05-24 NOTE — Evaluation (Signed)
Physical Therapy Evaluation Patient Details Name: Stacey Lucas MRN: [id number] DOB: 07-Jun-1976 Today's Date: 05/24/2021  History of Present Illness  44 y.o. female with medical history significant of HTN, hyperlipidemia, T2DM, depression who presented to ED with stroke like symptoms of left sided weakness, dizziness and speech deficits.  YBO:FBPZW or early subacute infarcts in the right corona radiata and  right periatrial white matter.  Clinical Impression  Pt admitted with/for left sided weakness, Imaging showing stroke.  On evaluation, R side showed to be weaker and limited gait the most at between min and mod assist..  Pt currently limited functionally due to the problems listed. ( See problems list.)   Pt will benefit from PT to maximize function and safety in order to get ready for next venue listed below.        Recommendations for follow up therapy are one component of a multi-disciplinary discharge planning process, led by the attending physician.  Recommendations may be updated based on patient status, additional functional criteria and insurance authorization.  Follow Up Recommendations CIR;Supervision/Assistance - 24 hour;Supervision - Intermittent    Equipment Recommendations  Rolling walker with 5" wheels    Recommendations for Other Services Rehab consult     Precautions / Restrictions Precautions Precautions: Fall      Mobility  Bed Mobility Overal bed mobility: Needs Assistance Bed Mobility: Supine to Sit;Sit to Supine     Supine to sit: Supervision Sit to supine: Min guard   General bed mobility comments: increased effort to bring legs back into the bed    Transfers Overall transfer level: Needs assistance Equipment used: Rolling walker (2 wheeled) Transfers: Sit to/from [employer] Sit to Stand: Min assist Stand pivot transfers: Min assist       General transfer comment: increased use of B UE's  Ambulation/Gait Ambulation/Gait  assistance: Min assist;Mod assist Gait Distance (Feet): 70 Feet Assistive device: Rolling walker (2 wheeled) Gait Pattern/deviations: Step-through pattern   Gait velocity interpretation: <1.8 ft/sec, indicate of risk for recurrent falls General Gait Details: mild instability overal, R>L incoordination with swing through and foot contact, heavy use of the RW.  Pt unable to "tuck" her pelvis in and maintain fully upright posture.  Stairs            Wheelchair Mobility    Modified Rankin (Stroke Patients Only) Modified Rankin (Stroke Patients Only) Pre-Morbid Rankin Score: Moderate disability Modified Rankin: Moderately severe disability     Balance Overall balance assessment: Needs assistance Sitting-balance support: Feet supported Sitting balance-Leahy Scale: Good     Standing balance support: Bilateral upper extremity supported Standing balance-Leahy Scale: Poor Standing balance comment: needing RW and external support                             Pertinent Vitals/Pain Pain Assessment: Faces Faces Pain Scale: Hurts little more Pain Location: headache Pain Descriptors / Indicators: Aching Pain Intervention(s): Monitored during session    Home Living Family/patient expects to be discharged to:: Private residence Living Arrangements: Spouse/significant other;Children Available Help at Discharge: Family;Available PRN/intermittently Type of Home: Apartment       Home Layout: One level Home Equipment: Cane - single point      Prior Function Level of Independence: Independent               Hand Dominance   Dominant Hand: Right    Extremity/Trunk Assessment   Upper Extremity Assessment RUE Deficits / Details: MMT grossly  3+/5 RUE Sensation: WNL RUE Coordination: WNL LUE Deficits / Details: 4/5 MMT grossly LUE Sensation: WNL LUE Coordination: WNL    Lower Extremity Assessment Lower Extremity Assessment: RLE deficits/detail;LLE  deficits/detail RLE Deficits / Details: gross hip flexion 2+, gross hip ext 3/5, quads 3/5, df/pf weak at 3/5 RLE Sensation: WNL;history of peripheral neuropathy RLE Coordination: decreased fine motor;decreased gross motor LLE Deficits / Details: WFL, df 3/5 LLE Sensation: history of peripheral neuropathy    Cervical / Trunk Assessment Cervical / Trunk Assessment: Normal  Communication   Communication: No difficulties  Cognition Arousal/Alertness: Awake/alert Behavior During Therapy: WFL for tasks assessed/performed Overall Cognitive Status: Within Functional Limits for tasks assessed                                        General Comments      Exercises     Assessment/Plan    PT Assessment Patient needs continued PT services  PT Problem List Decreased strength;Decreased activity tolerance;Decreased balance;Decreased mobility;Decreased coordination;Other (comment) (neuropathy L>R foot)       PT Treatment Interventions DME instruction;Gait training;Functional mobility training;Therapeutic activities;Balance training;Neuromuscular re-education;Patient/family education    PT Goals (Current goals can be found in the Care Plan section)  Acute Rehab PT Goals Patient Stated Goal: I want to get better PT Goal Formulation: With patient Time For Goal Achievement: 06/07/21 Potential to Achieve Goals: Good    Frequency Min 3X/week   Barriers to discharge        Co-evaluation               AM-PAC PT "6 Clicks" Mobility  Outcome Measure Help needed turning from your back to your side while in a flat bed without using bedrails?: A Little Help needed moving from lying on your back to sitting on the side of a flat bed without using bedrails?: A Little Help needed moving to and from a bed to a chair (including a wheelchair)?: A Little Help needed standing up from a chair using your arms (e.g., wheelchair or bedside chair)?: A Little Help needed to walk in  hospital room?: A Little Help needed climbing 3-5 steps with a railing? : A Lot 6 Click Score: 17    End of Session   Activity Tolerance: Patient tolerated treatment well Patient left: in bed;with call bell/phone within reach Nurse Communication: Mobility status PT Visit Diagnosis: Unsteadiness on feet (R26.81);Other abnormalities of gait and mobility (R26.89);Other symptoms and signs involving the nervous system (R29.898)    Time: 1700-1733 PT Time Calculation (min) (ACUTE ONLY): 33 min   Charges:   PT Evaluation $PT Eval Moderate Complexity: 1 Mod PT Treatments $Gait Training: 8-22 mins        05/24/2021  Jacinto Halim., PT Acute Rehabilitation Services [phone]  (pager) [phone]  (office)  Stacey Lucas 05/24/2021, 5:44 PM

## 2021-05-24 NOTE — Evaluation (Signed)
Occupational Therapy Evaluation Patient Details Name: Stacey Lucas MRN: [id number] DOB: 06-02-1976 Today's Date: 05/24/2021   History of Present Illness 45 y.o. female with medical history significant of HTN, hyperlipidemia, T2DM, depression who presented to ED with stroke like symptoms of left sided weakness, dizziness and speech deficits.  DPO:EUMPN or early subacute infarcts in the right corona radiata and  right periatrial white matter.   Clinical Impression   Patient admitted for the diagnosis above.  PTA she admits to 3 falls recently, lives with her SO and daughter, was largely independent with ADL and mobility, but had just started outpatient PT due to falls with knee buckle.  Deficits impacting independence are listed below.  Currently, she is needing up to Min A for basic mobility at a RW level, and ADL completion from a sit/stand level.  OT to follow in the acute setting, CIR is recommended given age, motivation, and ability to progress.       Recommendations for follow up therapy are one component of a multi-disciplinary discharge planning process, led by the attending physician.  Recommendations may be updated based on patient status, additional functional criteria and insurance authorization.   Follow Up Recommendations  CIR    Equipment Recommendations  Tub/shower seat    Recommendations for Other Services Rehab consult     Precautions / Restrictions Precautions Precautions: Fall Restrictions Weight Bearing Restrictions: No      Mobility Bed Mobility Overal bed mobility: Needs Assistance Bed Mobility: Supine to Sit;Sit to Supine     Supine to sit: Supervision Sit to supine: Supervision   General bed mobility comments: increased effort to bring legs back into the bed    Transfers Overall transfer level: Needs assistance Equipment used: Rolling walker (2 wheeled) Transfers: Sit to/from [employer] Sit to Stand: Min assist Stand pivot  transfers: Min assist       General transfer comment: increased use of B UE's    Balance Overall balance assessment: Needs assistance Sitting-balance support: Feet supported Sitting balance-Leahy Scale: Good     Standing balance support: Bilateral upper extremity supported Standing balance-Leahy Scale: Poor Standing balance comment: needing RW and external support                           ADL either performed or assessed with clinical judgement   ADL Overall ADL's : Needs assistance/impaired Eating/Feeding: Sitting;Set up   Grooming: Wash/dry hands;Wash/dry face;Oral care;Set up;Min guard;Standing   Upper Body Bathing: Supervision/ safety;Sitting   Lower Body Bathing: Minimal assistance;Sit to/from stand   Upper Body Dressing : Supervision/safety;Sitting   Lower Body Dressing: Minimal assistance;Sit to/from stand Lower Body Dressing Details (indicate cue type and reason): L leg weakness Toilet Transfer: Minimal assistance;RW   Toileting- Clothing Manipulation and Hygiene: Supervision/safety;Sit to/from stand       Functional mobility during ADLs: Minimal assistance;Rolling walker       Vision Patient Visual Report: No change from baseline       Perception     Praxis      Pertinent Vitals/Pain Faces Pain Scale: Hurts little more Pain Location: headache Pain Descriptors / Indicators: Aching Pain Intervention(s): Monitored during session     Hand Dominance Right   Extremity/Trunk Assessment Upper Extremity Assessment Upper Extremity Assessment: RUE deficits/detail;LUE deficits/detail RUE Deficits / Details: MMT grossly 3+/5 RUE Sensation: WNL RUE Coordination: WNL LUE Deficits / Details: 4/5 MMT grossly LUE Sensation: WNL LUE Coordination: WNL   Lower Extremity Assessment Lower  Extremity Assessment: Defer to PT evaluation   Cervical / Trunk Assessment Cervical / Trunk Assessment: Normal   Communication Communication Communication:  No difficulties   Cognition Arousal/Alertness: Awake/alert Behavior During Therapy: WFL for tasks assessed/performed Overall Cognitive Status: Within Functional Limits for tasks assessed                                     General Comments   VSS on RA    Exercises     Shoulder Instructions      Home Living Family/patient expects to be discharged to:: Private residence Living Arrangements: Spouse/significant other;Children Available Help at Discharge: Family;Available PRN/intermittently Type of Home: Apartment       Home Layout: One level     Bathroom Shower/Tub: [employer] Accessibility: Yes How Accessible: Accessible via walker Home Equipment: Cane - single point          Prior Functioning/Environment Level of Independence: Independent                 OT Problem List: Decreased strength;Decreased range of motion;Decreased activity tolerance;Impaired balance (sitting and/or standing);Pain      OT Treatment/Interventions: Self-care/ADL training;Therapeutic exercise;DME and/or AE instruction;Balance training;Therapeutic activities    OT Goals(Current goals can be found in the care plan section) Acute Rehab OT Goals Patient Stated Goal: I want to get better OT Goal Formulation: With patient Time For Goal Achievement: 06/07/21 Potential to Achieve Goals: Good  OT Frequency: Min 2X/week   Barriers to D/C:    none noted       Co-evaluation              AM-PAC OT "6 Clicks" Daily Activity     Outcome Measure Help from another person eating meals?: None Help from another person taking care of personal grooming?: A Little Help from another person toileting, which includes using toliet, bedpan, or urinal?: A Little Help from another person bathing (including washing, rinsing, drying)?: A Little Help from another person to put on and taking off regular upper body clothing?: None Help from another person to put on and  taking off regular lower body clothing?: A Little 6 Click Score: 20   End of Session Equipment Utilized During Treatment: Gait belt;Rolling walker Nurse Communication: Mobility status  Activity Tolerance: Patient tolerated treatment well Patient left: in bed;with call bell/phone within reach  OT Visit Diagnosis: Unsteadiness on feet (R26.81);Muscle weakness (generalized) (M62.81);History of falling (Z91.81);Pain Pain - Right/Left:  (headache)                Time: 0109-3235 OT Time Calculation (min): 18 min Charges:  OT General Charges $OT Visit: 1 Visit OT Evaluation $OT Eval Moderate Complexity: 1 Mod  05/24/2021  RP, OTR/L  Acute Rehabilitation Services  Office:  [phone]   Suzanna Obey 05/24/2021, 9:51 AM

## 2021-05-24 NOTE — Progress Notes (Signed)
? ?  Inpatient Rehab Admissions Coordinator : ? ?Per therapy recommendations, patient was screened for CIR candidacy by Mirakle Tomlin RN MSN.  At this time patient appears to be a potential candidate for CIR. I will place a rehab consult per protocol for full assessment. Please call me with any questions. ? ?Angelize Ryce RN MSN ?Admissions Coordinator ?[phone] ?  ?

## 2021-05-24 NOTE — ED Notes (Signed)
ED TO INPATIENT HANDOFF REPORT  ED Nurse Name and Phone #: [phone]   S Name/Age/Gender Stacey Lucas 45 y.o. female Room/Bed: 035C/035C  Code Status   Code Status: Full Code  Home/SNF/Other Home Patient oriented to: self, place, time, and situation Is this baseline? Yes   Triage Complete: Triage complete  Chief Complaint Acute CVA (cerebrovascular accident) [facility]) [I63.9]  Triage Note Pt BIB [facility] for CODE STROKE. Pt had sudden onset L sided weakness of arm and leg at 1100. No hx of stroke. Hx of HTN, diabetes.   EMS VS- 117/62, HR 78, CBG 130, 100% SpO2   Allergies No Known Allergies  Level of Care/Admitting Diagnosis ED Disposition     ED Disposition  Admit   Condition  --   Comment  Hospital Area: [facility] [[id number]]  Level of Care: Telemetry Medical [[id number]]  May place patient in observation at [facility] or [facility] if equivalent level of care is available:: No  Covid Evaluation: Asymptomatic Screening Protocol (No Symptoms)  Diagnosis: Acute CVA (cerebrovascular accident) [facility]) [[id number]]  Admitting Physician: Orland Mustard [[id number]]  Attending Physician: Orland Mustard [[id number]]          B Medical/Surgery History Past Medical History:  Diagnosis Date   Anxiety and depression    Diabetes mellitus without complication (HCC)    Type II   GERD (gastroesophageal reflux disease)    Hyperlipidemia    Hypertension    Impingement syndrome of right shoulder    Migraine    Pyelonephritis    Sciatica    Past Surgical History:  Procedure Laterality Date   tubal       A IV Location/Drains/Wounds Patient Lines/Drains/Airways Status     Active Line/Drains/Airways     Name Placement date Placement time Site Days   Peripheral IV 05/23/21 18 G Anterior;Left;Proximal Forearm 05/23/21  0000  Forearm  1   External Urinary Catheter 05/23/21  1610  --  1            Intake/Output Last 24 hours No intake or output data in the 24  hours ending 05/24/21 0330  Labs/Imaging Results for orders placed or performed during the hospital encounter of 05/23/21 (from the past 48 hour(s))  CBG monitoring, ED     Status: Abnormal   Collection Time: 05/23/21 11:25 AM  Result Value Ref Range   Glucose-Capillary 120 (H) 70 - 99 mg/dL    Comment: Glucose reference range applies only to samples taken after fasting for at least 8 hours.  Ethanol     Status: None   Collection Time: 05/23/21 11:30 AM  Result Value Ref Range   Alcohol, Ethyl (B) <10 <10 mg/dL    Comment: (NOTE) Lowest detectable limit for serum alcohol is 10 mg/dL.  For medical purposes only. Performed at [facility], [address] [place], [place] [address]   CBC     Status: Abnormal   Collection Time: 05/23/21 11:30 AM  Result Value Ref Range   WBC 12.1 (H) 4.0 - 10.5 K/uL   RBC 5.29 (H) 3.87 - 5.11 MIL/uL   Hemoglobin 14.1 12.0 - 15.0 g/dL   HCT 57.8 46.9 - 62.9 %   MCV 78.6 (L) 80.0 - 100.0 fL   MCH 26.7 26.0 - 34.0 pg   MCHC 33.9 30.0 - 36.0 g/dL   RDW 52.8 (H) 41.3 - 24.4 %   Platelets 207 150 - 400 K/uL   nRBC 0.0 0.0 - 0.2 %  Comment: Performed at [facility], [address], [place], [place] [address]  Differential     Status: Abnormal   Collection Time: 05/23/21 11:30 AM  Result Value Ref Range   Neutrophils Relative % 65 %   Neutro Abs 7.9 (H) 1.7 - 7.7 K/uL   Lymphocytes Relative 24 %   Lymphs Abs 2.9 0.7 - 4.0 K/uL   Monocytes Relative 9 %   Monocytes Absolute 1.1 (H) 0.1 - 1.0 K/uL   Eosinophils Relative 1 %   Eosinophils Absolute 0.1 0.0 - 0.5 K/uL   Basophils Relative 1 %   Basophils Absolute 0.1 0.0 - 0.1 K/uL   Immature Granulocytes 0 %   Abs Immature Granulocytes 0.04 0.00 - 0.07 K/uL    Comment: Performed at [facility], [address], [place], [place] [address]  Hemoglobin A1c     Status: Abnormal   Collection Time: 05/23/21 11:30 AM  Result Value Ref Range   Hgb A1c MFr Bld 8.6 (H) 4.8 - 5.6 %     Comment: (NOTE) Pre diabetes:          5.7%-6.4%  Diabetes:              >6.4%  Glycemic control for   <7.0% adults with diabetes    Mean Plasma Glucose 200.12 mg/dL    Comment: Performed at [facility], [address] [place], [place] [address]  I-stat chem 8, ED     Status: Abnormal   Collection Time: 05/23/21 11:33 AM  Result Value Ref Range   Sodium 136 135 - 145 mmol/L   Potassium 6.4 (HH) 3.5 - 5.1 mmol/L   Chloride 107 98 - 111 mmol/L   BUN 16 6 - 20 mg/dL   Creatinine, Ser 9.14 0.44 - 1.00 mg/dL   Glucose, Bld 782 (H) 70 - 99 mg/dL    Comment: Glucose reference range applies only to samples taken after fasting for at least 8 hours.   Calcium, Ion 1.00 (L) 1.15 - 1.40 mmol/L   TCO2 24 22 - 32 mmol/L   Hemoglobin 14.3 12.0 - 15.0 g/dL   HCT 95.6 21.3 - 08.6 %   Comment NOTIFIED PHYSICIAN   I-Stat beta hCG blood, ED     Status: Abnormal   Collection Time: 05/23/21 11:38 AM  Result Value Ref Range   I-stat hCG, quantitative 20.2 (H) <5 mIU/mL   Comment 3            Comment:   GEST. AGE      CONC.  (mIU/mL)   <=1 WEEK        5 - 50     2 WEEKS       50 - 500     3 WEEKS       100 - 10,000     4 WEEKS     1,000 - 30,000        FEMALE AND NON-PREGNANT FEMALE:     LESS THAN 5 mIU/mL   Resp Panel by RT-PCR (Flu A&B, Covid) Nasopharyngeal Swab     Status: None   Collection Time: 05/23/21 12:01 PM   Specimen: Nasopharyngeal Swab; Nasopharyngeal(NP) swabs in vial transport medium  Result Value Ref Range   SARS Coronavirus 2 by RT PCR NEGATIVE NEGATIVE    Comment: (NOTE) SARS-CoV-2 target nucleic acids are NOT DETECTED.  The SARS-CoV-2 RNA is generally detectable in upper respiratory specimens during the acute phase of infection. The lowest concentration  of SARS-CoV-2 viral copies this assay can detect is 138 copies/mL. A negative result does not preclude SARS-Cov-2 infection and should not be used as the sole basis for treatment or other patient management  decisions. A negative result may occur with  improper specimen collection/handling, submission of specimen other than nasopharyngeal swab, presence of viral mutation(s) within the areas targeted by this assay, and inadequate number of viral copies(<138 copies/mL). A negative result must be combined with clinical observations, patient history, and epidemiological information. The expected result is Negative.  Fact Sheet for Patients:  BloggerCourse.com  Fact Sheet for Healthcare Providers:  SeriousBroker.it  This test is no t yet approved or cleared by the [place] FDA and  has been authorized for detection and/or diagnosis of SARS-CoV-2 by FDA under an Emergency Use Authorization (EUA). This EUA will remain  in effect (meaning this test can be used) for the duration of the COVID-19 declaration under Section 564(b)(1) of the Act, 21 U.S.C.section 360bbb-3(b)(1), unless the authorization is terminated  or revoked sooner.       Influenza A by PCR NEGATIVE NEGATIVE   Influenza B by PCR NEGATIVE NEGATIVE    Comment: (NOTE) The Xpert Xpress SARS-CoV-2/FLU/RSV plus assay is intended as an aid in the diagnosis of influenza from Nasopharyngeal swab specimens and should not be used as a sole basis for treatment. Nasal washings and aspirates are unacceptable for Xpert Xpress SARS-CoV-2/FLU/RSV testing.  Fact Sheet for Patients: BloggerCourse.com  Fact Sheet for Healthcare Providers: SeriousBroker.it  This test is not yet approved or cleared by the [place] FDA and has been authorized for detection and/or diagnosis of SARS-CoV-2 by FDA under an Emergency Use Authorization (EUA). This EUA will remain in effect (meaning this test can be used) for the duration of the COVID-19 declaration under Section 564(b)(1) of the Act, 21 U.S.C. section 360bbb-3(b)(1), unless the authorization  is terminated or revoked.  Performed at [facility], [address], [place], [place] [address]   Comprehensive metabolic panel     Status: Abnormal   Collection Time: 05/23/21 12:20 PM  Result Value Ref Range   Sodium 137 135 - 145 mmol/L   Potassium 4.8 3.5 - 5.1 mmol/L   Chloride 104 98 - 111 mmol/L   CO2 24 22 - 32 mmol/L   Glucose, Bld 122 (H) 70 - 99 mg/dL    Comment: Glucose reference range applies only to samples taken after fasting for at least 8 hours.   BUN 12 6 - 20 mg/dL   Creatinine, Ser 5.78 (H) 0.44 - 1.00 mg/dL   Calcium 8.1 (L) 8.9 - 10.3 mg/dL   Total Protein 4.9 (L) 6.5 - 8.1 g/dL   Albumin 1.3 (L) 3.5 - 5.0 g/dL   AST 16 15 - 41 U/L   ALT 11 0 - 44 U/L   Alkaline Phosphatase 63 38 - 126 U/L   Total Bilirubin 1.5 (H) 0.3 - 1.2 mg/dL   GFR, Estimated 59 (L) >60 mL/min    Comment: (NOTE) Calculated using the CKD-EPI Creatinine Equation (2021)    Anion gap 9 5 - 15    Comment: Performed at [facility], [address], [place], [place] [address]  Protime-INR     Status: None   Collection Time: 05/23/21 12:20 PM  Result Value Ref Range   Prothrombin Time 13.7 11.4 - 15.2 seconds   INR 1.1 0.8 - 1.2    Comment: (NOTE) INR goal varies based on device and disease  states. Performed at [facility], [address], [place], [place] [address]   APTT     Status: Abnormal   Collection Time: 05/23/21 12:20 PM  Result Value Ref Range   aPTT 38 (H) 24 - 36 seconds    Comment:        IF BASELINE aPTT IS ELEVATED, SUGGEST PATIENT RISK ASSESSMENT BE USED TO DETERMINE APPROPRIATE ANTICOAGULANT THERAPY. Performed at [facility], [address], [place], [place] [address]   Lipid panel     Status: Abnormal   Collection Time: 05/23/21 12:20 PM  Result Value Ref Range   Cholesterol 341 (H) 0 - 200 mg/dL   Triglycerides 098 (H) <150 mg/dL   HDL 65 >11 mg/dL   Total CHOL/HDL Ratio 5.2 RATIO   VLDL 31 0 - 40 mg/dL   LDL Cholesterol 914  (H) 0 - 99 mg/dL    Comment:        Total Cholesterol/HDL:CHD Risk Coronary Heart Disease Risk Table                     Men   Women  1/2 Average Risk   3.4   3.3  Average Risk       5.0   4.4  2 X Average Risk   9.6   7.1  3 X Average Risk  23.4   11.0        Use the calculated Patient Ratio above and the CHD Risk Table to determine the patient's CHD Risk.        ATP III CLASSIFICATION (LDL):  <100     mg/dL   Optimal  782-956  mg/dL   Near or Above                    Optimal  130-159  mg/dL   Borderline  213-086  mg/dL   High  >578     mg/dL   Very High Performed at [facility], [address] [place], [place] [address]   CBG monitoring, ED     Status: None   Collection Time: 05/23/21  4:45 PM  Result Value Ref Range   Glucose-Capillary 99 70 - 99 mg/dL    Comment: Glucose reference range applies only to samples taken after fasting for at least 8 hours.   Comment 1 Notify RN    Comment 2 Document in Chart   HIV Antibody (routine testing w rflx)     Status: None   Collection Time: 05/23/21  4:47 PM  Result Value Ref Range   HIV Screen 4th Generation wRfx Non Reactive Non Reactive    Comment: Performed at [facility], [address], [place], [place] [address]   MR BRAIN WO CONTRAST  Result Date: 05/23/2021 CLINICAL DATA:  Neuro deficit, acute, stroke suspected EXAM: MRI HEAD WITHOUT CONTRAST TECHNIQUE: Multiplanar, multiecho pulse sequences of the brain and surrounding structures were obtained without intravenous contrast. COMPARISON:  CT head from same day.  MRI head 04/14/2020. FINDINGS: Brain: Acute or early subacute infarcts in the right corona radiata and right periatrial white matter. No significant edema or mass effect. No acute hemorrhage, hydrocephalus, extra-axial collection or mass lesion. Moderate, age-advanced T2 hyperintensities within the white matter. Vascular: Major arterial flow voids are maintained at the skull base. Further evaluated on same  day CTA. Skull and upper cervical spine: Normal marrow signal. Sinuses/Orbits: Remote left medial orbital wall fracture. Moderately extensive paranasal sinus  mucosal thickening, greatest in the ethmoid air cells and maxillary sinuses. Other: Small left mastoid effusion. IMPRESSION: 1. Acute or early subacute infarcts in the right corona radiata and right periatrial white matter. No significant edema or mass effect. 2. Moderate, age-advanced T2 hyperintensities within the white matter, nonspecific but most likely related to chronic microvascular ischemic disease given the patient's risk factors (including diabetes, hypertension, and hyperlipidemia). Prior demyelination is a differential consideration. 3. Moderately severe paranasal sinus mucosal thickening. Electronically Signed   By: Feliberto Harts M.D.   On: 05/23/2021 14:32   ECHOCARDIOGRAM COMPLETE  Result Date: 05/23/2021    ECHOCARDIOGRAM REPORT   Patient Name:   Stacey Lucas Date of Exam: 05/23/2021 Medical Rec #:  [id number]    Height:       65.0 in Accession #:    [id number]   Weight:       150.8 lb Date of Birth:  1976-03-21   BSA:          1.754 m Patient Age:    44 years     BP:           144/58 mmHg Patient Gender: F            HR:           104 bpm. Exam Location:  Inpatient Procedure: 2D Echo, Cardiac Doppler and Color Doppler Indications:    CVA  History:        Patient has no prior history of Echocardiogram examinations.                 Risk Factors:Hypertension and Diabetes.  Sonographer:    MH Referring Phys: [id number] Reyne Dumas [facility] IMPRESSIONS  1. Left ventricular ejection fraction, by estimation, is 60 to 65%. The left ventricle has normal function. The left ventricle has no regional wall motion abnormalities. There is mild concentric left ventricular hypertrophy. Left ventricular diastolic parameters were normal.  2. Right ventricular systolic function is normal. The right ventricular size is normal.  3. The mitral valve is normal in  structure. No evidence of mitral valve regurgitation. No evidence of mitral stenosis.  4. The aortic valve is tricuspid. Aortic valve regurgitation is not visualized. No aortic stenosis is present.  5. The inferior vena cava is normal in size with greater than 50% respiratory variability, suggesting right atrial pressure of 3 mmHg. FINDINGS  Left Ventricle: Left ventricular ejection fraction, by estimation, is 60 to 65%. The left ventricle has normal function. The left ventricle has no regional wall motion abnormalities. The left ventricular internal cavity size was normal in size. There is  mild concentric left ventricular hypertrophy. Left ventricular diastolic parameters were normal. Right Ventricle: The right ventricular size is normal. No increase in right ventricular wall thickness. Right ventricular systolic function is normal. Left Atrium: Left atrial size was normal in size. Right Atrium: Right atrial size was normal in size. Pericardium: There is no evidence of pericardial effusion. Mitral Valve: The mitral valve is normal in structure. No evidence of mitral valve regurgitation. No evidence of mitral valve stenosis. Tricuspid Valve: The tricuspid valve is normal in structure. Tricuspid valve regurgitation is trivial. No evidence of tricuspid stenosis. Aortic Valve: The aortic valve is tricuspid. Aortic valve regurgitation is not visualized. No aortic stenosis is present. Aortic valve mean gradient measures 4.0 mmHg. Aortic valve peak gradient measures 6.7 mmHg. Aortic valve area, by VTI measures 2.85 cm. Pulmonic Valve: The pulmonic valve was normal in structure. Pulmonic valve regurgitation is  not visualized. No evidence of pulmonic stenosis. Aorta: The aortic root is normal in size and structure. Venous: The inferior vena cava is normal in size with greater than 50% respiratory variability, suggesting right atrial pressure of 3 mmHg. IAS/Shunts: No atrial level shunt detected by color flow Doppler.   LEFT VENTRICLE PLAX 2D LVIDd:         3.80 cm     Diastology LVIDs:         2.60 cm     LV e' medial:  14.40 cm/s LV PW:         1.35 cm     LV e' lateral: 16.10 cm/s LV IVS:        1.09 cm LVOT diam:     2.00 cm LV SV:         73 LV SV Index:   42 LVOT Area:     3.14 cm  LV Volumes (MOD) LV vol d, MOD A4C: 44.7 ml LV vol s, MOD A4C: 20.2 ml LV SV MOD A4C:     44.7 ml RIGHT VENTRICLE             IVC RV S prime:     14.60 cm/s  IVC diam: 1.50 cm TAPSE (M-mode): 2.4 cm LEFT ATRIUM             Index       RIGHT ATRIUM           Index LA diam:        2.60 cm 1.48 cm/m  RA Area:     11.50 cm LA Vol (A2C):   49.1 ml 27.99 ml/m RA Volume:   21.80 ml  12.43 ml/m LA Vol (A4C):   34.6 ml 19.72 ml/m LA Biplane Vol: 42.9 ml 24.45 ml/m  AORTIC VALVE                   PULMONIC VALVE AV Area (Vmax):    2.61 cm    PV Vmax:       0.90 m/s AV Area (Vmean):   2.77 cm    PV Peak grad:  3.2 mmHg AV Area (VTI):     2.85 cm AV Vmax:           129.00 cm/s AV Vmean:          90.300 cm/s AV VTI:            0.256 m AV Peak Grad:      6.7 mmHg AV Mean Grad:      4.0 mmHg LVOT Vmax:         107.00 cm/s LVOT Vmean:        79.600 cm/s LVOT VTI:          0.232 m LVOT/AV VTI ratio: 0.91  AORTA Ao Root diam: 2.50 cm MITRAL VALVE MV Area (PHT): 4.58 cm SHUNTS                         Systemic VTI:  0.23 m                         Systemic Diam: 2.00 cm Chilton Si MD Electronically signed by Chilton Si MD Signature Date/Time: 05/23/2021/5:31:15 PM    Final    CT HEAD CODE STROKE WO CONTRAST  Result Date: 05/23/2021 CLINICAL DATA:  Code stroke. Neuro deficit, acute, stroke suspected. Left-sided weakness. EXAM: CT HEAD WITHOUT CONTRAST TECHNIQUE: Contiguous  axial images were obtained from the base of the skull through the vertex without intravenous contrast. COMPARISON:  Head MRI 04/14/2020 FINDINGS: Brain: A lacunar infarct in the right caudate head is new and of indeterminate acuity. There is a new chronic infarct involving the  left basal ganglia and corona radiata. Hypodensities elsewhere in the cerebral white matter bilaterally are nonspecific but likely reflect chronic small-vessel ischemia given vascular risk factors. No acute intracranial hemorrhage, mass, midline shift, or extra-axial fluid collection is identified. Vascular: Calcified atherosclerosis at the skull base. No hyperdense vessel. Skull: No acute fracture or suspicious osseous lesion. Sinuses/Orbits: Remote medial left orbital blowout fracture. Moderately extensive right greater than left ethmoid air cell opacification. Circumferential mucosal thickening and secretions in the maxillary sinuses. Clear mastoid air cells. Other: None. ASPECTS (Alberta Stroke Program Early CT Score) - Ganglionic level infarction (caudate, lentiform nuclei, internal capsule, insula, M1-M3 cortex): 6 - Supraganglionic infarction (M4-M6 cortex): 3 Total score (0-10 with 10 being normal): 9 IMPRESSION: 1. New right caudate infarct, age indeterminate but potentially acute. ASPECTS of 9. 2. New chronic left basal ganglia infarct. 3. No acute intracranial hemorrhage. These results were communicated to Dr. Thomasena Edis at 11:44 am on 05/23/2021 by text page via the [facility] messaging system. Electronically Signed   By: Sebastian Ache M.D.   On: 05/23/2021 11:46   CT ANGIO HEAD NECK W WO CM (CODE STROKE)  Result Date: 05/23/2021 CLINICAL DATA:  Neuro deficit, acute, stroke suspected. Left-sided weakness. EXAM: CT ANGIOGRAPHY HEAD AND NECK TECHNIQUE: Multidetector CT imaging of the head and neck was performed using the standard protocol during bolus administration of intravenous contrast. Multiplanar CT image reconstructions and MIPs were obtained to evaluate the vascular anatomy. Carotid stenosis measurements (when applicable) are obtained utilizing NASCET criteria, using the distal internal carotid diameter as the denominator. CONTRAST:  67mL OMNIPAQUE IOHEXOL 350 MG/ML SOLN COMPARISON:  None. FINDINGS: CTA  NECK FINDINGS Aortic arch: Standard 3 vessel aortic arch with mild atherosclerotic plaque. Widely patent arch vessel origins. Right carotid system: Patent without evidence of stenosis, dissection, or significant atherosclerosis. Left carotid system: Patent without evidence of stenosis, dissection, or significant atherosclerosis. Vertebral arteries: Patent and codominant without evidence of stenosis, dissection, or significant atherosclerosis. Skeleton: Moderate cervical disc degeneration. Other neck: 1.8 cm complex right thyroid nodule. Upper chest: No apical lung consolidation or mass. Review of the MIP images confirms the above findings CTA HEAD FINDINGS Anterior circulation: The internal carotid arteries are widely patent from skull base to carotid termini. ACAs and MCAs are patent without evidence of a proximal branch occlusion or significant proximal stenosis. No aneurysm is identified. Posterior circulation: The intracranial vertebral arteries are widely patent to the basilar. Patent AICA and SCA origins are identified bilaterally. The basilar artery is widely patent. Posterior communicating arteries are diminutive or absent. Both PCAs are patent without evidence of a significant proximal stenosis. No aneurysm is identified. Venous sinuses: Patent. Anatomic variants: None. Review of the MIP images confirms the above findings IMPRESSION: 1. No large vessel occlusion or significant stenosis in the head or neck. 2. 1.8 cm right thyroid nodule. Recommend thyroid US (ref: J Am Coll Radiol. 2015 Feb;12(2): 143-50). 3.  Aortic Atherosclerosis (ICD10-I70.0). These results were communicated to Dr. Thomasena Edis at 11:44 am on 05/23/2021 by text page via the [facility] messaging system. Electronically Signed   By: Sebastian Ache M.D.   On: 05/23/2021 11:54    Pending Labs [employer] (From admission, onward)     Start  Ordered   05/24/21 0500  Basic metabolic panel  Tomorrow morning,   R        05/23/21 1403   05/23/21  1403  Pregnancy, urine  (Labs)  Once,   STAT        05/23/21 1403   05/23/21 1125  Urine rapid drug screen (hosp performed)  ONCE - STAT,   STAT        05/23/21 1125   05/23/21 1125  Urinalysis, Routine w reflex microscopic  ONCE - STAT,   STAT        05/23/21 1125            Vitals/Pain Today's Vitals   05/23/21 2030 05/23/21 2100 05/24/21 0300 05/24/21 0306  BP: (!) 144/84 (!) 150/82 (!) 196/91 (!) 167/95  Pulse: 100 96 (!) 111 (!) 109  Resp: 14 14 20 18   Temp:      TempSrc:      SpO2: 100% 100% 98% 99%  Weight:      Height:      PainSc:        Isolation Precautions No active isolations  Medications Medications  aspirin chewable tablet 324 mg (324 mg Oral Given 05/23/21 1207)    Followed by  aspirin chewable tablet 81 mg (has no administration in time range)   stroke: mapping our early stages of recovery book ( Does not apply Not Given 05/23/21 1506)  acetaminophen (TYLENOL) tablet 650 mg (has no administration in time range)    Or  acetaminophen (TYLENOL) 160 MG/5ML solution 650 mg (has no administration in time range)    Or  acetaminophen (TYLENOL) suppository 650 mg (has no administration in time range)  senna-docusate (Senokot-S) tablet 1 tablet (has no administration in time range)  enoxaparin (LOVENOX) injection 40 mg (40 mg Subcutaneous Given 05/23/21 1501)  insulin aspart (novoLOG) injection 0-9 Units (0 Units Subcutaneous Not Given 05/23/21 1649)  nicotine (NICODERM CQ - dosed in mg/24 hours) patch 21 mg (21 mg Transdermal Patch Applied 05/23/21 1502)  iohexol (OMNIPAQUE) 350 MG/ML injection 75 mL (75 mLs Intravenous Contrast Given 05/23/21 1143)  sodium chloride 0.9 % bolus 250 mL (0 mLs Intravenous Stopped 05/23/21 1548)    Mobility walks Moderate fall risk   Focused Assessments Neuro Assessment Handoff:  Swallow screen pass? Yes    NIH Stroke Scale ( + Modified Stroke Scale Criteria)  Interval: Initial Level of Consciousness (1a.)   : Alert, keenly  responsive LOC Questions (1b. )   +: Answers both questions correctly LOC Commands (1c. )   + : Performs both tasks correctly Best Gaze (2. )  +: Normal Visual (3. )  +: No visual loss Facial Palsy (4. )    : Normal symmetrical movements Motor Arm, Left (5a. )   +: No drift Motor Arm, Right (5b. )   +: No drift Motor Leg, Left (6a. )   +: No drift Motor Leg, Right (6b. )   +: No drift Limb Ataxia (7. ): Absent Sensory (8. )   +: Normal, no sensory loss Best Language (9. )   +: No aphasia Dysarthria (10. ): Normal Extinction/Inattention (11.)   +: No Abnormality Modified SS Total  +: 0 Complete NIHSS TOTAL: 2 Last date known well: 05/23/21 Last time known well: 1000 Neuro Assessment: Exceptions to WDL Neuro Checks:   Initial (05/23/21 1157)  Last Documented NIHSS Modified Score: 0 (05/23/21 1835) Has TPA been given? No If patient is a Neuro Trauma and patient is  going to OR before floor call report to 4N Charge nurse: [phone] or [phone]   R Recommendations: See Admitting Provider Note  Report given to:   Additional Notes: purewick / bedpan

## 2021-05-24 NOTE — Evaluation (Signed)
Speech Language Pathology Evaluation Patient Details Name: Stacey Lucas MRN: [id number] DOB: 06/23/1976 Today's Date: 05/24/2021 Time: 7829-5621 SLP Time Calculation (min) (ACUTE ONLY): 17 min  Problem List:  Patient Active Problem List   Diagnosis Date Noted   Cerebral thrombosis with cerebral infarction 05/24/2021   Osteoarthritis of right knee 05/24/2021   Type 2 diabetes mellitus without complication (HCC) 05/23/2021   Acute left-sided weakness 05/23/2021   HTN (hypertension) 05/23/2021   Hyperlipidemia 05/23/2021   Thyroid nodule 05/23/2021   MDD (major depressive disorder), recurrent severe, without psychosis (HCC) 04/27/2018   MDD (major depressive disorder), severe (HCC) 04/25/2018   Past Medical History:  Past Medical History:  Diagnosis Date   Anxiety and depression    Diabetes mellitus without complication (HCC)    Type II   GERD (gastroesophageal reflux disease)    Hyperlipidemia    Hypertension    Impingement syndrome of right shoulder    Migraine    Osteoarthritis of right knee 05/24/2021   Pyelonephritis    Sciatica    Past Surgical History:  Past Surgical History:  Procedure Laterality Date   tubal     HPI:  45 y.o. female with medical history significant of HTN, hyperlipidemia, T2DM, depression who presented to ED with stroke like symptoms of left sided weakness, dizziness and speech deficits.  HYQ:MVHQI or early subacute infarcts in the right corona radiata and  right periatrial white matter.   Assessment / Plan / Recommendation Clinical Impression  Pts cognitive linguistic abilities appear back to baseline. Pts significant other present and confirmed pt back to normal. Pt was exhibiting confusion and speech changes during admission that have since cleared. Pt oriented x4 and with good recall of hospital events. Motor speech skills, receptive, and expressive language skills were all intact. No further ST needs identified.    SLP Assessment  SLP  Recommendation/Assessment: Patient does not need any further Speech Lanaguage Pathology Services SLP Visit Diagnosis: Cognitive communication deficit (R41.841)    Recommendations for follow up therapy are one component of a multi-disciplinary discharge planning process, led by the attending physician.  Recommendations may be updated based on patient status, additional functional criteria and insurance authorization.    Follow Up Recommendations     Per PT/OT recs  Frequency and Duration   N/a        SLP Evaluation Cognition  Overall Cognitive Status: Within Functional Limits for tasks assessed Arousal/Alertness: Awake/alert Orientation Level: Oriented X4 Attention: Focused;Sustained Focused Attention: Appears intact Sustained Attention: Appears intact Memory: Appears intact Awareness: Appears intact Problem Solving: Appears intact Executive Function: Self Monitoring Self Monitoring: Appears intact Safety/Judgment: Appears intact       Comprehension  Auditory Comprehension Overall Auditory Comprehension: Appears within functional limits for tasks assessed    Expression Expression Primary Mode of Expression: Verbal Verbal Expression Overall Verbal Expression: Appears within functional limits for tasks assessed Written Expression Dominant Hand: Right   Oral / Motor  Oral Motor/Sensory Function Overall Oral Motor/Sensory Function: Within functional limits Motor Speech Overall Motor Speech: Appears within functional limits for tasks assessed   GO                    Ardyth Gal MA, CCC-SLP Acute Rehabilitation Services   05/24/2021, 11:58 AM

## 2021-05-24 NOTE — Plan of Care (Signed)
  Problem: Education: Goal: Knowledge of disease or condition will improve Outcome: Progressing Goal: Knowledge of patient specific risk factors addressed and post discharge goals established will improve Outcome: Progressing   Problem: Coping: Goal: Will verbalize positive feelings about self Outcome: Progressing Goal: Will identify appropriate support needs Outcome: Progressing   Problem: Health Behavior/Discharge Planning: Goal: Ability to manage health-related needs will improve Outcome: Progressing   Problem: Self-Care: Goal: Ability to participate in self-care as condition permits will improve Outcome: Progressing   Problem: Nutrition: Goal: Risk of aspiration will decrease Outcome: Progressing   Problem: Ischemic Stroke/TIA Tissue Perfusion: Goal: Complications of ischemic stroke/TIA will be minimized Outcome: Progressing

## 2021-05-24 NOTE — Progress Notes (Signed)
STROKE TEAM PROGRESS NOTE   INTERVAL HISTORY Her husband is at the bedside.  Briefly, presented yesterday with acute left sided weakness on chronic right sided weakness, the combination of which caused ambulatory dysfunction. Also with nausea and vomiting., speech changes which have resolved.   No alteplase, as stroke completed on Ct imaging per MD reivew. No LVO symptoms. Admitted for stroke workup.  No new focal neurological deficits overnight.  Vitals:   05/24/21 0500 05/24/21 0620 05/24/21 0700 05/24/21 0821  BP: 140/72 (!) 175/91 104/60 (!) 170/92  Pulse: (!) 103     Resp: 14 (!) 24 15 16   Temp:      TempSrc:      SpO2: 99%   100%  Weight:      Height:       CBC:  Recent Labs  Lab 05/23/21 1130 05/23/21 1133  WBC 12.1*  --   NEUTROABS 7.9*  --   HGB 14.1 14.3  HCT 41.6 42.0  MCV 78.6*  --   PLT 207  --    Basic Metabolic Panel:  Recent Labs  Lab 05/23/21 1220 05/24/21 0626  NA 137 137  K 4.8 3.7  CL 104 102  CO2 24 23  GLUCOSE 122* 119*  BUN 12 13  CREATININE 1.17* 1.14*  CALCIUM 8.1* 8.3*   Lipid Panel:  Recent Labs  Lab 05/23/21 1220  CHOL 341*  TRIG 154*  HDL 65  CHOLHDL 5.2  VLDL 31  LDLCALC 05/25/21*   HgbA1c:  Recent Labs  Lab 05/23/21 1130  HGBA1C 8.6*   Urine Drug Screen: No results for input(s): LABOPIA, COCAINSCRNUR, LABBENZ, AMPHETMU, THCU, LABBARB in the last 168 hours.  Alcohol Level  Recent Labs  Lab 05/23/21 1130  ETH <10   IMAGING past 24 hours No results found.  PHYSICAL EXAM Pleasant middle-age African-American lady not in distress. . Afebrile. Head is nontraumatic. Neck is supple without bruit.    Cardiac exam no murmur or gallop. Lungs are clear to auscultation. Distal pulses are well felt.  Neurological Exam: Awake alert oriented x 3 normal speech and language. Mild left lower face asymmetry. Tongue midline. No drift. Mild diminished fine finger movements on left. Orbits right over left upper extremity. Mild left grip  weak.. Normal sensation . Normal coordination.  ASSESSMENT/PLAN Stacey Lucas is a 45 y.o. female with history of type 2 diabetes mellitus, essential hypertension, hyperlipidemia, migraine headaches, and sciatica (?> cause of chronic right sided weakness) presenting with left sided weakness, speech changes, nausea, and vomiting. Now feels back to her baseline. No alteplase, no thrombectomy.    Pt/ot/slp = rehab   Plan DAPtx 21 days  then asa alone  +tobacco use , social wprk automatically notified   Stroke: right caudate infarct, likely secondary to small vessel disease  Code Stroke CT head: lacunar right caudate infarction of undetermined age, Left basal ganglia infarcts, as well as in corona radiata. Small vessel ischemic changes throughout white matter. ASPECTS 9  CTA head & neck unremarkable for vessel stenosis or occlusions.  CT perfusion n/a  MRI  acute to subacute appearing infarcts of right corona radiata and periatrial white matter. Chronic microvascular ischemic changes throuhgout white matter.  2D Echo LA size normal, ef 60-65%. No regional wall abnormalities. LVH.  LDL 245 HgbA1c 8.6  VTE prophylaxis - enoxaparin     Diet   Diet heart healthy/carb modified Room service appropriate? Yes; Fluid consistency: Thin   No antithrombotic prior to admission, now on aspirin  81 mg daily and clopidogrel 75 mg daily. X 21 days , then will take aspirin alone  Therapy recommendations:  no need SLP, OT recs CIR, pending PT evaluation  Disposition:  pending PT eval   Hypertension Home meds:  furosemide 20, lisinopril 5mg   Unstable Permissive hypertension (OK if < 220/120) but gradually normalize in 5-7 days Long-term BP goal normotensive  Hyperlipidemia Home meds:  rosuvastatin 10mg  , this has been continued  LDL 245, goal < 70  Diabetes type II Uncontrolled Home meds:  ozempic  HgbA1c 8.6, goal < 7.0 CBGs Recent Labs    05/23/21 1645 05/24/21 0743 05/24/21 1130  GLUCAP 99  145* 170*   SS[facility] inpatient Counseling provided   Other Stroke Risk Factors Cigarette smoker,advised to stop smoking ETOH use, alcohol level <10, advised to drink no more than 1-2 drink(s) a day Hx stroke/TIA Migraines  Other Active Problems Tobacco use: 21mg  nicotine patch applied, gradual taper per primary team  Incidental thyroid nodule: follow up with PCP, TSH w/ T4  Incidental nasal mucosal thickening: follow up PCP outpatient   Hospital day # 0 05/26/21, 05/26/21 Neurology   Stroke MD :  I have personally obtained history,examined this patient, reviewed notes, independently viewed imaging studies, participated in medical decision making and plan of care.ROS completed by me personally and pertinent positives fully documented  I have made any additions or clarifications directly to the above note. Agree with note above. She presented with mild left-sided weakness secondary to small right subcortical lacunar infarct likely from small vessel disease.  CT angiogram shows no significant large vessel stenosis or occlusion.  Echocardiogram is unremarkable.  LDL is significantly elevated as is hemoglobin A1c.  Recommend aspirin and Plavix for 3 weeks followed by Plavix alone.  Check urine drug screen.  Greater than 50% time during this 35-minute visit was spent on counseling and coordination of care and discussion with care team.  Patient may mobilize out of bed and is stable to be discharged home after therapy clearance.  Discussed with Dr. , MD Medical Director [facility] Pager: [phone] 05/24/2021 6:06 PM  To contact Stroke Continuity provider, please refer to [facility]. After hours, contact General Neurology

## 2021-05-24 NOTE — Progress Notes (Signed)
Pt arrived to room w/ cell phone and bag. Pt is A/Ox4. She has weakness in rt grip and rt leg. Pt states she has a headache.

## 2021-05-24 NOTE — Progress Notes (Signed)
Inpatient Diabetes Program Recommendations  AACE/ADA: New Consensus Statement on Inpatient Glycemic Control   Target Ranges:  Prepandial:   less than 140 mg/dL      Peak postprandial:   less than 180 mg/dL (1-2 hours)      Critically ill patients:  140 - 180 mg/dL   Results for Stacey Lucas, Stacey Lucas (MRN [id number]) as of 05/24/2021 08:21  Ref. Range 05/23/2021 11:25 05/23/2021 16:45 05/24/2021 07:43  Glucose-Capillary Latest Ref Range: 70 - 99 mg/dL 585 (H) 99 277 (H)   Results for Stacey Lucas, Stacey Lucas (MRN [id number]) as of 05/24/2021 08:21  Ref. Range 11/27/2017 17:08 11/28/2017 10:19 10/30/2020 12:10 05/23/2021 11:30  Hemoglobin A1C Latest Ref Range: 4.8 - 5.6 % >15.5 (H) 14.0 12.7 (H) 8.6 (H)   Review of Glycemic Control  Diabetes history: DM2 Outpatient Diabetes medications: Basaglar 20 units daily, Ozempic 2 mg Qweek Current orders for Inpatient glycemic control: Novolog 0-9 units TID with meals  Inpatient Diabetes Program Recommendations:    HbgA1C: A1C 8.6% on 05/23/21 indicating an average glucose of 200 mg/dl over the past 2-3 months. Prior A1C 12.7% on 10/30/20. Current A1C significantly improved.  NOTE: Noted consult for DM education. Chart reviewed. Patient admitted with CVA. Ordered Living Well with DM book. Diabetes Coordinator working remotely today. Called patient over the phone. Spoke with patient about diabetes and home regimen for diabetes control. Patient reports being followed by PCP for diabetes management and currently taking Basaglar 20 units daily and Ozempic 2 mg Qweek as an outpatient for diabetes control. Patient reports taking DM medications as prescribed and notes that Basaglar dose was increased about 2 weeks ago by PCP. Patient reports checking glucose 2-3 times per day and that it is usually 150-200 mg/dl.  Discussed A1C results (8.6% on 05/23/21) and explained that current A1C indicates an average glucose of 200 mg/dl over the past 2-3 months. Commended patient for improving A1C from  12.7% in February to 8.6% currently. Discussed glucose and A1C goals. Discussed importance of maintaining good CBG control to prevent long-term and short-term complications. Explained how hyperglycemia leads to damage within blood vessels which lead to the common complications seen with uncontrolled diabetes. Stressed to the patient the importance of improving glycemic control to prevent further complications from uncontrolled diabetes especially given CVA. Patient states she has everything at home for DM management. Encouraged patient to continue to work with PCP to get DM under better control and A1C to goal of 7% or less.  Patient verbalized understanding of information discussed and reports no further questions at this time related to diabetes. Ordered Living Well with DM book.  Thanks, Orlando Penner, RN, MSN, CDE Diabetes Coordinator Inpatient Diabetes Program [phone] (Team Pager)

## 2021-05-24 NOTE — Progress Notes (Addendum)
PROGRESS NOTE    Stacey Lucas  XQJ:[id number] DOB: [birth year] DOA: 05/23/2021 PCP: Lewis Moccasin, MD   Brief Narrative:  This 45 years old female with PMH significant for hypertension, hyperlipidemia, type 2 diabetes, depression presented in the ED with strokelike symptoms.  Patient presented with left-sided weakness associated with dizziness and slurring of speech.  Patient also had some numbness during that episode.  Patient states she was waiting for uber and felt weak and fell around 9:30 AM.  She denied any loss of consciousness.  She does not remember anything afterwards what happened. Her fianc has called EMS and patient was brought in the ED.  She was unable to move her left leg and has left arm weakness. Patient does report frequent falls due to her knee arthritis and has been going to therapy. CT head showed  New right caudate infarct,  age indeterminant and potentially acute.  CTA head and neck no large vessel occlusion or significant stenosis in the head and neck area.  Patient was evaluated by the stroke team recommended dual antiplatelet therapy for 21 days followed by aspirin only.  Assessment & Plan:   Principal Problem:   Acute left-sided weakness Active Problems:   MDD (major depressive disorder), recurrent severe, without psychosis (HCC)   Type 2 diabetes mellitus without complication (HCC)   HTN (hypertension)   Hyperlipidemia   Thyroid nodule   Cerebral thrombosis with cerebral infarction   Osteoarthritis of right knee  Left-sided weakness sec.to Acute CVA: Patient presented with left-sided weakness, dizziness and speech deficits,  CT head shows age indeterminant CVA, right caudate infarct. Continue telemetry. Patient was evaluated by stroke team recommended  stroke work-up. Continue frequent neurochecks. Echocardiogram LVEF 60 to 65%, no embolic source. LDL 245, start Lipitor 80 mg. Neurology recommended dual antiplatelet therapy for 3 weeks followed by  aspirin only. -OT/ST/PT evaluation.  -Left-sided weakness has significantly improved. -maximize medical therapy and encouraged smoking cessation.    Type 2 diabetes: Hemoglobin A1c 8.6, not controlled. Continue Basaglar, hold Ozempic Continue regular insulin sliding scale. Dietitian consult.   Elevated creatinine: Does not meet definition of AKI. Continue IV hydration.  Recheck a.m. labs   Hypertension: Allow permissive hypertension for 24 to 48 hours in the setting of possible acute stroke Resume home blood pressure medication after 48 hours.  Hyperlipidemia: LDL 245.  Crestor 10 mg daily   Thyroid nodule: Incidentally found thyroid nodules on imaging Recommend outpatient thyroid ultrasound   Tobacco abuse Nicotine patch, encourage cessation.   Body mass index is 25.09 kg/m.   DVT prophylaxis: Lovenox Code Status: Full code. Family Communication: No family at bed side. Disposition Plan:   Status is: Observation  The patient remains OBS appropriate and will d/c before 2 midnights.  Dispo: The patient is from: Home              Anticipated d/c is to: Home              Patient currently is not medically stable to d/c.   Difficult to place patient No  Consultants:  Neurology  Procedures:  CVA workup  Antimicrobials:   Anti-infectives (From admission, onward)    None       Subjective: Patient was seen and examined at bedside.  Overnight events noted.   Patient reports weakness and dizziness has improved.  She asked when she can be discharged.  Objective: Vitals:   05/24/21 0500 05/24/21 0620 05/24/21 0700 05/24/21 0821  BP: 140/72 (!) 175/91 104/60 Marland Kitchen)  170/92  Pulse: (!) 103     Resp: 14 (!) 24 15 16   Temp:      TempSrc:      SpO2: 99%   100%  Weight:      Height:        Intake/Output Summary (Last 24 hours) at 05/24/2021 1425 Last data filed at 05/24/2021 0700 Gross per 24 hour  Intake 240 ml  Output --  Net 240 ml   Filed Weights    05/23/21 1100  Weight: 68.4 kg    Examination:  General exam: Appears comfortable, not in any acute distress. Respiratory system: Clear to auscultation bilaterally, no wheezing. Cardiovascular system: S1-S2 heard, regular rate and rhythm, no murmur. Gastrointestinal system: Abdomen is soft, nontender, nondistended, BS+. Central nervous system: Alert and oriented x 3 . Left Arm and left LE 4/5, 5/5. Extremities: No edema, no cyanosis, no clubbing. Skin: No rashes, lesions or ulcers Psychiatry: Judgement and insight appear normal. Mood & affect appropriate.     Data Reviewed: I have personally reviewed following labs and imaging studies  CBC: Recent Labs  Lab 05/23/21 1130 05/23/21 1133  WBC 12.1*  --   NEUTROABS 7.9*  --   HGB 14.1 14.3  HCT 41.6 42.0  MCV 78.6*  --   PLT 207  --    Basic Metabolic Panel: Recent Labs  Lab 05/23/21 1133 05/23/21 1220 05/24/21 0626  NA 136 137 137  K 6.4* 4.8 3.7  CL 107 104 102  CO2  --  24 23  GLUCOSE 122* 122* 119*  BUN 16 12 13   CREATININE 1.00 1.17* 1.14*  CALCIUM  --  8.1* 8.3*   GFR: Estimated Creatinine Clearance: 60.6 mL/min (A) (by C-G formula based on SCr of 1.14 mg/dL (H)). Liver Function Tests: Recent Labs  Lab 05/23/21 1220  AST 16  ALT 11  ALKPHOS 63  BILITOT 1.5*  PROT 4.9*  ALBUMIN 1.3*   No results for input(s): LIPASE, AMYLASE in the last 168 hours. No results for input(s): AMMONIA in the last 168 hours. Coagulation Profile: Recent Labs  Lab 05/23/21 1220  INR 1.1   Cardiac Enzymes: No results for input(s): CKTOTAL, CKMB, CKMBINDEX, TROPONINI in the last 168 hours. BNP (last 3 results) No results for input(s): PROBNP in the last 8760 hours. HbA1C: Recent Labs    05/23/21 1130  HGBA1C 8.6*   CBG: Recent Labs  Lab 05/23/21 1125 05/23/21 1645 05/24/21 0743 05/24/21 1130  GLUCAP 120* 99 145* 170*   Lipid Profile: Recent Labs    05/23/21 1220  CHOL 341*  HDL 65  LDLCALC 245*  TRIG  154*  CHOLHDL 5.2   Thyroid Function Tests: No results for input(s): TSH, T4TOTAL, FREET4, T3FREE, THYROIDAB in the last 72 hours. Anemia Panel: No results for input(s): VITAMINB12, FOLATE, FERRITIN, TIBC, IRON, RETICCTPCT in the last 72 hours. Sepsis Labs: No results for input(s): PROCALCITON, LATICACIDVEN in the last 168 hours.  Recent Results (from the past 240 hour(s))  Resp Panel by RT-PCR (Flu A&B, Covid) Nasopharyngeal Swab     Status: None   Collection Time: 05/23/21 12:01 PM   Specimen: Nasopharyngeal Swab; Nasopharyngeal(NP) swabs in vial transport medium  Result Value Ref Range Status   SARS Coronavirus 2 by RT PCR NEGATIVE NEGATIVE Final    Comment: (NOTE) SARS-CoV-2 target nucleic acids are NOT DETECTED.  The SARS-CoV-2 RNA is generally detectable in upper respiratory specimens during the acute phase of infection. The lowest concentration of SARS-CoV-2 viral copies this  assay can detect is 138 copies/mL. A negative result does not preclude SARS-Cov-2 infection and should not be used as the sole basis for treatment or other patient management decisions. A negative result may occur with  improper specimen collection/handling, submission of specimen other than nasopharyngeal swab, presence of viral mutation(s) within the areas targeted by this assay, and inadequate number of viral copies(<138 copies/mL). A negative result must be combined with clinical observations, patient history, and epidemiological information. The expected result is Negative.  Fact Sheet for Patients:  BloggerCourse.com  Fact Sheet for Healthcare Providers:  SeriousBroker.it  This test is no t yet approved or cleared by the [place] FDA and  has been authorized for detection and/or diagnosis of SARS-CoV-2 by FDA under an Emergency Use Authorization (EUA). This EUA will remain  in effect (meaning this test can be used) for the duration of  the COVID-19 declaration under Section 564(b)(1) of the Act, 21 U.S.C.section 360bbb-3(b)(1), unless the authorization is terminated  or revoked sooner.       Influenza A by PCR NEGATIVE NEGATIVE Final   Influenza B by PCR NEGATIVE NEGATIVE Final    Comment: (NOTE) The Xpert Xpress SARS-CoV-2/FLU/RSV plus assay is intended as an aid in the diagnosis of influenza from Nasopharyngeal swab specimens and should not be used as a sole basis for treatment. Nasal washings and aspirates are unacceptable for Xpert Xpress SARS-CoV-2/FLU/RSV testing.  Fact Sheet for Patients: BloggerCourse.com  Fact Sheet for Healthcare Providers: SeriousBroker.it  This test is not yet approved or cleared by the [place] FDA and has been authorized for detection and/or diagnosis of SARS-CoV-2 by FDA under an Emergency Use Authorization (EUA). This EUA will remain in effect (meaning this test can be used) for the duration of the COVID-19 declaration under Section 564(b)(1) of the Act, 21 U.S.C. section 360bbb-3(b)(1), unless the authorization is terminated or revoked.  Performed at [facility], [address], [place], [place] [address]     Radiology Studies: MR BRAIN WO CONTRAST  Result Date: 05/23/2021 CLINICAL DATA:  Neuro deficit, acute, stroke suspected EXAM: MRI HEAD WITHOUT CONTRAST TECHNIQUE: Multiplanar, multiecho pulse sequences of the brain and surrounding structures were obtained without intravenous contrast. COMPARISON:  CT head from same day.  MRI head 04/14/2020. FINDINGS: Brain: Acute or early subacute infarcts in the right corona radiata and right periatrial white matter. No significant edema or mass effect. No acute hemorrhage, hydrocephalus, extra-axial collection or mass lesion. Moderate, age-advanced T2 hyperintensities within the white matter. Vascular: Major arterial flow voids are maintained at the skull base. Further  evaluated on same day CTA. Skull and upper cervical spine: Normal marrow signal. Sinuses/Orbits: Remote left medial orbital wall fracture. Moderately extensive paranasal sinus mucosal thickening, greatest in the ethmoid air cells and maxillary sinuses. Other: Small left mastoid effusion. IMPRESSION: 1. Acute or early subacute infarcts in the right corona radiata and right periatrial white matter. No significant edema or mass effect. 2. Moderate, age-advanced T2 hyperintensities within the white matter, nonspecific but most likely related to chronic microvascular ischemic disease given the patient's risk factors (including diabetes, hypertension, and hyperlipidemia). Prior demyelination is a differential consideration. 3. Moderately severe paranasal sinus mucosal thickening. Electronically Signed   By: Feliberto Harts M.D.   On: 05/23/2021 14:32   ECHOCARDIOGRAM COMPLETE  Result Date: 05/23/2021    ECHOCARDIOGRAM REPORT   Patient Name:   Stacey Lucas Date of Exam: 05/23/2021 Medical Rec #:  [id number]    Height:  65.0 in Accession #:    [id number]   Weight:       150.8 lb Date of Birth:  03/20/1976   BSA:          1.754 m Patient Age:    44 years     BP:           144/58 mmHg Patient Gender: F            HR:           104 bpm. Exam Location:  Inpatient Procedure: 2D Echo, Cardiac Doppler and Color Doppler Indications:    CVA  History:        Patient has no prior history of Echocardiogram examinations.                 Risk Factors:Hypertension and Diabetes.  Sonographer:    MH Referring Phys: [id number] Reyne Dumas [facility] IMPRESSIONS  1. Left ventricular ejection fraction, by estimation, is 60 to 65%. The left ventricle has normal function. The left ventricle has no regional wall motion abnormalities. There is mild concentric left ventricular hypertrophy. Left ventricular diastolic parameters were normal.  2. Right ventricular systolic function is normal. The right ventricular size is normal.  3. The mitral valve  is normal in structure. No evidence of mitral valve regurgitation. No evidence of mitral stenosis.  4. The aortic valve is tricuspid. Aortic valve regurgitation is not visualized. No aortic stenosis is present.  5. The inferior vena cava is normal in size with greater than 50% respiratory variability, suggesting right atrial pressure of 3 mmHg. FINDINGS  Left Ventricle: Left ventricular ejection fraction, by estimation, is 60 to 65%. The left ventricle has normal function. The left ventricle has no regional wall motion abnormalities. The left ventricular internal cavity size was normal in size. There is  mild concentric left ventricular hypertrophy. Left ventricular diastolic parameters were normal. Right Ventricle: The right ventricular size is normal. No increase in right ventricular wall thickness. Right ventricular systolic function is normal. Left Atrium: Left atrial size was normal in size. Right Atrium: Right atrial size was normal in size. Pericardium: There is no evidence of pericardial effusion. Mitral Valve: The mitral valve is normal in structure. No evidence of mitral valve regurgitation. No evidence of mitral valve stenosis. Tricuspid Valve: The tricuspid valve is normal in structure. Tricuspid valve regurgitation is trivial. No evidence of tricuspid stenosis. Aortic Valve: The aortic valve is tricuspid. Aortic valve regurgitation is not visualized. No aortic stenosis is present. Aortic valve mean gradient measures 4.0 mmHg. Aortic valve peak gradient measures 6.7 mmHg. Aortic valve area, by VTI measures 2.85 cm. Pulmonic Valve: The pulmonic valve was normal in structure. Pulmonic valve regurgitation is not visualized. No evidence of pulmonic stenosis. Aorta: The aortic root is normal in size and structure. Venous: The inferior vena cava is normal in size with greater than 50% respiratory variability, suggesting right atrial pressure of 3 mmHg. IAS/Shunts: No atrial level shunt detected by color flow  Doppler.  LEFT VENTRICLE PLAX 2D LVIDd:         3.80 cm     Diastology LVIDs:         2.60 cm     LV e' medial:  14.40 cm/s LV PW:         1.35 cm     LV e' lateral: 16.10 cm/s LV IVS:        1.09 cm LVOT diam:     2.00 cm LV SV:  73 LV SV Index:   42 LVOT Area:     3.14 cm  LV Volumes (MOD) LV vol d, MOD A4C: 44.7 ml LV vol s, MOD A4C: 20.2 ml LV SV MOD A4C:     44.7 ml RIGHT VENTRICLE             IVC RV S prime:     14.60 cm/s  IVC diam: 1.50 cm TAPSE (M-mode): 2.4 cm LEFT ATRIUM             Index       RIGHT ATRIUM           Index LA diam:        2.60 cm 1.48 cm/m  RA Area:     11.50 cm LA Vol (A2C):   49.1 ml 27.99 ml/m RA Volume:   21.80 ml  12.43 ml/m LA Vol (A4C):   34.6 ml 19.72 ml/m LA Biplane Vol: 42.9 ml 24.45 ml/m  AORTIC VALVE                   PULMONIC VALVE AV Area (Vmax):    2.61 cm    PV Vmax:       0.90 m/s AV Area (Vmean):   2.77 cm    PV Peak grad:  3.2 mmHg AV Area (VTI):     2.85 cm AV Vmax:           129.00 cm/s AV Vmean:          90.300 cm/s AV VTI:            0.256 m AV Peak Grad:      6.7 mmHg AV Mean Grad:      4.0 mmHg LVOT Vmax:         107.00 cm/s LVOT Vmean:        79.600 cm/s LVOT VTI:          0.232 m LVOT/AV VTI ratio: 0.91  AORTA Ao Root diam: 2.50 cm MITRAL VALVE MV Area (PHT): 4.58 cm SHUNTS                         Systemic VTI:  0.23 m                         Systemic Diam: 2.00 cm Chilton Si MD Electronically signed by Chilton Si MD Signature Date/Time: 05/23/2021/5:31:15 PM    Final    CT HEAD CODE STROKE WO CONTRAST  Result Date: 05/23/2021 CLINICAL DATA:  Code stroke. Neuro deficit, acute, stroke suspected. Left-sided weakness. EXAM: CT HEAD WITHOUT CONTRAST TECHNIQUE: Contiguous axial images were obtained from the base of the skull through the vertex without intravenous contrast. COMPARISON:  Head MRI 04/14/2020 FINDINGS: Brain: A lacunar infarct in the right caudate head is new and of indeterminate acuity. There is a new chronic infarct  involving the left basal ganglia and corona radiata. Hypodensities elsewhere in the cerebral white matter bilaterally are nonspecific but likely reflect chronic small-vessel ischemia given vascular risk factors. No acute intracranial hemorrhage, mass, midline shift, or extra-axial fluid collection is identified. Vascular: Calcified atherosclerosis at the skull base. No hyperdense vessel. Skull: No acute fracture or suspicious osseous lesion. Sinuses/Orbits: Remote medial left orbital blowout fracture. Moderately extensive right greater than left ethmoid air cell opacification. Circumferential mucosal thickening and secretions in the maxillary sinuses. Clear mastoid air cells. Other: None. ASPECTS [facility] Stroke Program Early CT Score) -  Ganglionic level infarction (caudate, lentiform nuclei, internal capsule, insula, M1-M3 cortex): 6 - Supraganglionic infarction (M4-M6 cortex): 3 Total score (0-10 with 10 being normal): 9 IMPRESSION: 1. New right caudate infarct, age indeterminate but potentially acute. ASPECTS of 9. 2. New chronic left basal ganglia infarct. 3. No acute intracranial hemorrhage. These results were communicated to Dr. Thomasena Edis at 11:44 am on 05/23/2021 by text page via the [facility] messaging system. Electronically Signed   By: Sebastian Ache M.D.   On: 05/23/2021 11:46   CT ANGIO HEAD NECK W WO CM (CODE STROKE)  Result Date: 05/23/2021 CLINICAL DATA:  Neuro deficit, acute, stroke suspected. Left-sided weakness. EXAM: CT ANGIOGRAPHY HEAD AND NECK TECHNIQUE: Multidetector CT imaging of the head and neck was performed using the standard protocol during bolus administration of intravenous contrast. Multiplanar CT image reconstructions and MIPs were obtained to evaluate the vascular anatomy. Carotid stenosis measurements (when applicable) are obtained utilizing NASCET criteria, using the distal internal carotid diameter as the denominator. CONTRAST:  32mL OMNIPAQUE IOHEXOL 350 MG/ML SOLN COMPARISON:  None.  FINDINGS: CTA NECK FINDINGS Aortic arch: Standard 3 vessel aortic arch with mild atherosclerotic plaque. Widely patent arch vessel origins. Right carotid system: Patent without evidence of stenosis, dissection, or significant atherosclerosis. Left carotid system: Patent without evidence of stenosis, dissection, or significant atherosclerosis. Vertebral arteries: Patent and codominant without evidence of stenosis, dissection, or significant atherosclerosis. Skeleton: Moderate cervical disc degeneration. Other neck: 1.8 cm complex right thyroid nodule. Upper chest: No apical lung consolidation or mass. Review of the MIP images confirms the above findings CTA HEAD FINDINGS Anterior circulation: The internal carotid arteries are widely patent from skull base to carotid termini. ACAs and MCAs are patent without evidence of a proximal branch occlusion or significant proximal stenosis. No aneurysm is identified. Posterior circulation: The intracranial vertebral arteries are widely patent to the basilar. Patent AICA and SCA origins are identified bilaterally. The basilar artery is widely patent. Posterior communicating arteries are diminutive or absent. Both PCAs are patent without evidence of a significant proximal stenosis. No aneurysm is identified. Venous sinuses: Patent. Anatomic variants: None. Review of the MIP images confirms the above findings IMPRESSION: 1. No large vessel occlusion or significant stenosis in the head or neck. 2. 1.8 cm right thyroid nodule. Recommend thyroid US (ref: J Am Coll Radiol. 2015 Feb;12(2): 143-50). 3.  Aortic Atherosclerosis (ICD10-I70.0). These results were communicated to Dr. Thomasena Edis at 11:44 am on 05/23/2021 by text page via the [facility] messaging system. Electronically Signed   By: Sebastian Ache M.D.   On: 05/23/2021 11:54    Scheduled Meds:   stroke: mapping our early stages of recovery book   Does not apply Once   aspirin  81 mg Oral Daily   [START ON 05/25/2021] clopidogrel  75  mg Oral Daily   enoxaparin (LOVENOX) injection  40 mg Subcutaneous Q24H   insulin aspart  0-9 Units Subcutaneous TID WC   insulin glargine-yfgn  20 Units Subcutaneous Daily   living well with diabetes book   Does not apply Once   nicotine  21 mg Transdermal Daily   rosuvastatin  10 mg Oral QHS   Continuous Infusions:   LOS: 0 days    Time spent: 35 mins    Dailyn Kempner, MD [facility]   If 7PM-7AM, please contact night-coverage

## 2021-05-25 ENCOUNTER — Encounter (HOSPITAL_COMMUNITY): Payer: Self-pay | Admitting: Family Medicine

## 2021-05-25 DIAGNOSIS — M1711 Unilateral primary osteoarthritis, right knee: Secondary | ICD-10-CM | POA: Diagnosis present

## 2021-05-25 DIAGNOSIS — R7989 Other specified abnormal findings of blood chemistry: Secondary | ICD-10-CM | POA: Diagnosis present

## 2021-05-25 DIAGNOSIS — I639 Cerebral infarction, unspecified: Secondary | ICD-10-CM | POA: Diagnosis present

## 2021-05-25 DIAGNOSIS — I10 Essential (primary) hypertension: Secondary | ICD-10-CM | POA: Diagnosis present

## 2021-05-25 DIAGNOSIS — Z8261 Family history of arthritis: Secondary | ICD-10-CM | POA: Diagnosis not present

## 2021-05-25 DIAGNOSIS — R29702 NIHSS score 2: Secondary | ICD-10-CM | POA: Diagnosis present

## 2021-05-25 DIAGNOSIS — F17210 Nicotine dependence, cigarettes, uncomplicated: Secondary | ICD-10-CM | POA: Diagnosis present

## 2021-05-25 DIAGNOSIS — Z20822 Contact with and (suspected) exposure to covid-19: Secondary | ICD-10-CM | POA: Diagnosis present

## 2021-05-25 DIAGNOSIS — Z8673 Personal history of transient ischemic attack (TIA), and cerebral infarction without residual deficits: Secondary | ICD-10-CM | POA: Diagnosis not present

## 2021-05-25 DIAGNOSIS — R4781 Slurred speech: Secondary | ICD-10-CM | POA: Diagnosis present

## 2021-05-25 DIAGNOSIS — R531 Weakness: Secondary | ICD-10-CM | POA: Diagnosis not present

## 2021-05-25 DIAGNOSIS — G8194 Hemiplegia, unspecified affecting left nondominant side: Secondary | ICD-10-CM | POA: Diagnosis present

## 2021-05-25 DIAGNOSIS — Z79899 Other long term (current) drug therapy: Secondary | ICD-10-CM | POA: Diagnosis not present

## 2021-05-25 DIAGNOSIS — F1490 Cocaine use, unspecified, uncomplicated: Secondary | ICD-10-CM | POA: Diagnosis present

## 2021-05-25 DIAGNOSIS — G8929 Other chronic pain: Secondary | ICD-10-CM | POA: Diagnosis present

## 2021-05-25 DIAGNOSIS — E785 Hyperlipidemia, unspecified: Secondary | ICD-10-CM | POA: Diagnosis present

## 2021-05-25 DIAGNOSIS — K219 Gastro-esophageal reflux disease without esophagitis: Secondary | ICD-10-CM | POA: Diagnosis present

## 2021-05-25 DIAGNOSIS — R197 Diarrhea, unspecified: Secondary | ICD-10-CM | POA: Diagnosis present

## 2021-05-25 DIAGNOSIS — R296 Repeated falls: Secondary | ICD-10-CM | POA: Diagnosis present

## 2021-05-25 DIAGNOSIS — E119 Type 2 diabetes mellitus without complications: Secondary | ICD-10-CM | POA: Diagnosis present

## 2021-05-25 DIAGNOSIS — Z833 Family history of diabetes mellitus: Secondary | ICD-10-CM | POA: Diagnosis not present

## 2021-05-25 DIAGNOSIS — E042 Nontoxic multinodular goiter: Secondary | ICD-10-CM | POA: Diagnosis present

## 2021-05-25 DIAGNOSIS — Z794 Long term (current) use of insulin: Secondary | ICD-10-CM | POA: Diagnosis not present

## 2021-05-25 DIAGNOSIS — E875 Hyperkalemia: Secondary | ICD-10-CM | POA: Diagnosis present

## 2021-05-25 DIAGNOSIS — Z8249 Family history of ischemic heart disease and other diseases of the circulatory system: Secondary | ICD-10-CM | POA: Diagnosis not present

## 2021-05-25 DIAGNOSIS — I6330 Cerebral infarction due to thrombosis of unspecified cerebral artery: Secondary | ICD-10-CM | POA: Diagnosis present

## 2021-05-25 LAB — GLUCOSE, CAPILLARY
Glucose-Capillary: 129 mg/dL — ABNORMAL HIGH (ref 70–99)
Glucose-Capillary: 138 mg/dL — ABNORMAL HIGH (ref 70–99)
Glucose-Capillary: 146 mg/dL — ABNORMAL HIGH (ref 70–99)
Glucose-Capillary: 58 mg/dL — ABNORMAL LOW (ref 70–99)
Glucose-Capillary: 65 mg/dL — ABNORMAL LOW (ref 70–99)
Glucose-Capillary: 70 mg/dL (ref 70–99)
Glucose-Capillary: 72 mg/dL (ref 70–99)

## 2021-05-25 MED ORDER — DEXTROSE 50 % IV SOLN
INTRAVENOUS | Status: AC
  Administered 2021-05-25: 25 mL
  Filled 2021-05-25: qty 50

## 2021-05-25 MED ORDER — INSULIN GLARGINE-YFGN 100 UNIT/ML ~~LOC~~ SOLN
18.0000 [IU] | Freq: Every day | SUBCUTANEOUS | Status: DC
  Filled 2021-05-25: qty 0.18

## 2021-05-25 NOTE — Progress Notes (Signed)
STROKE TEAM PROGRESS NOTE   INTERVAL HISTORY Her husband is at the bedside.  Briefly, presented yesterday with acute left sided weakness on chronic right sided weakness, the combination of which caused ambulatory dysfunction. Also with nausea and vomiting., speech changes which have resolved.   No alteplase, as stroke completed on Ct imaging per MD reivew. No LVO symptoms. Admitted for stroke workup.  No new focal neurological deficits overnight.  Vitals:   05/24/21 2152 05/25/21 0022 05/25/21 0705 05/25/21 1309  BP: (!) 159/74 (!) 145/77 (!) 156/63 (!) 166/73  Pulse: 94 64 (!) 102   Resp: 18 18 19    Temp: 98 F (36.7 C)  98.4 F (36.9 C) 97.8 F (36.6 C)  TempSrc: Oral  Oral Oral  SpO2: 100% 98% 100% 100%  Weight:      Height:       CBC:  Recent Labs  Lab 05/23/21 1130 05/23/21 1133  WBC 12.1*  --   NEUTROABS 7.9*  --   HGB 14.1 14.3  HCT 41.6 42.0  MCV 78.6*  --   PLT 207  --    Basic Metabolic Panel:  Recent Labs  Lab 05/23/21 1220 05/24/21 0626  NA 137 137  K 4.8 3.7  CL 104 102  CO2 24 23  GLUCOSE 122* 119*  BUN 12 13  CREATININE 1.17* 1.14*  CALCIUM 8.1* 8.3*   Lipid Panel:  Recent Labs  Lab 05/23/21 1220  CHOL 341*  TRIG 154*  HDL 65  CHOLHDL 5.2  VLDL 31  LDLCALC 05/25/21*   HgbA1c:  Recent Labs  Lab 05/23/21 1130  HGBA1C 8.6*   Urine Drug Screen:  Recent Labs  Lab 05/24/21 1830  LABOPIA NONE DETECTED  COCAINSCRNUR POSITIVE*  LABBENZ NONE DETECTED  AMPHETMU NONE DETECTED  THCU NONE DETECTED  LABBARB NONE DETECTED    Alcohol Level  Recent Labs  Lab 05/23/21 1130  ETH <10   IMAGING past 24 hours No results found.  PHYSICAL EXAM Pleasant middle-age African-American lady not in distress. . Afebrile. Head is nontraumatic. Neck is supple without bruit.    Cardiac exam no murmur or gallop. Lungs are clear to auscultation. Distal pulses are well felt.  Neurological Exam: Awake alert oriented x 3 normal speech and language. Mild left  lower face asymmetry. Tongue midline. No drift. Mild diminished fine finger movements on left. Orbits right over left upper extremity. Mild left grip weak.. Normal sensation . Normal coordination.  ASSESSMENT/PLAN Stacey Lucas is a 45 y.o. female with history of type 2 diabetes mellitus, essential hypertension, hyperlipidemia, migraine headaches, and sciatica (?> cause of chronic right sided weakness) presenting with left sided weakness, speech changes, nausea, and vomiting. Now feels back to her baseline. No alteplase, no thrombectomy.    Pt/ot/slp = rehab   Plan DAPtx 21 days  then asa alone  +tobacco use , social wprk automatically notified   Stroke: right caudate infarct, likely secondary to small vessel disease  Code Stroke CT head: lacunar right caudate infarction of undetermined age, Left basal ganglia infarcts, as well as in corona radiata. Small vessel ischemic changes throughout white matter. ASPECTS 9  CTA head & neck unremarkable for vessel stenosis or occlusions.  CT perfusion n/a  MRI  acute to subacute appearing infarcts of right corona radiata and periatrial white matter. Chronic microvascular ischemic changes throuhgout white matter.  2D Echo LA size normal, ef 60-65%. No regional wall abnormalities. LVH.  LDL 245 HgbA1c 8.6  VTE prophylaxis - enoxaparin  Diet   Diet heart healthy/carb modified Room service appropriate? Yes; Fluid consistency: Thin   No antithrombotic prior to admission, now on aspirin 81 mg daily and clopidogrel 75 mg daily. X 21 days , then will take aspirin alone  Therapy recommendations:  no need SLP, OT recs CIR, pending PT evaluation  Disposition:  pending PT eval   Hypertension Home meds:  furosemide 20, lisinopril 5mg   Unstable Permissive hypertension (OK if < 220/120) but gradually normalize in 5-7 days Long-term BP goal normotensive  Hyperlipidemia Home meds:  rosuvastatin 10mg  , this has been continued  LDL 245, goal <  70  Diabetes type II Uncontrolled Home meds:  ozempic  HgbA1c 8.6, goal < 7.0 CBGs Recent Labs    05/25/21 0629 05/25/21 0804 05/25/21 1203  GLUCAP 65* 146* 72   SSI inpatient Counseling provided   Other Stroke Risk Factors Cigarette smoker,advised to stop smoking ETOH use, alcohol level <10, advised to drink no more than 1-2 drink(s) a day Hx stroke/TIA Migraines  Other Active Problems Tobacco use: 21mg  nicotine patch applied, gradual taper per primary team  Incidental thyroid nodule: follow up with PCP, TSH w/ T4  Incidental nasal mucosal thickening: follow up PCP outpatient   Hospital day # 0   She presented with mild left-sided weakness secondary to small right subcortical lacunar infarct likely from small vessel disease.  Stroke work-up is completed.  Recommend aspirin and Plavix for 3 weeks followed by Plavix alone.  Patient counseled to quit smoking cigarettes and using cocaine.  Discussed with patient and husband and answered questions.  Greater than 50% time during this 25-minute visit was spent on counseling and coordination of care and discussion with care team.  Patient may mobilize out of bed and is stable to be discharged home after therapy clearance.  Discussed with Dr. 05/27/21 Stroke team will sign off.  Kindly call for questions. 05/27/21, MD Medical Director [facility] Pager: [phone] 05/25/2021 3:08 PM  To contact Stroke Continuity provider, please refer to [facility]. After hours, contact General Neurology

## 2021-05-25 NOTE — TOC Initial Note (Addendum)
Transition of Care [facility]) - Initial/Assessment Note    Patient Details  Name: Stacey Lucas MRN: [id number] Date of Birth: [birth year]  Transition of Care [facility] CM/SW Contact:    Joanne Chars, LCSW Phone Number: 05/25/2021, 4:03 PM  Clinical Narrative:     CSW informed by PT that plan changing to CIR.  CSW met with pt, aunt Stacey Lucas in room, permission given to speak with Stacey Lucas present as well as with Stacey Lucas and daughter Stacey Lucas.  Pt is agreeable to [facility], choice document given, no agency preference indicated.  PCP in place.  Pt is vaccinated for covid with one booster.      Stacie at [facility].              Expected Discharge Plan: [place] Barriers to Discharge: Continued Medical Work up   Patient Goals and CMS Choice Patient states their goals for this hospitalization and ongoing recovery are:: "back to what I was" CMS Medicare.gov Compare Post Acute Care list provided to:: Patient Choice offered to / list presented to : Patient  Expected Discharge Plan and Services Expected Discharge Plan: [place] Choice: [place] arrangements for the past 2 months: Single Family Home                                      Prior Living Arrangements/Services Living arrangements for the past 2 months: Single Family Home Lives with:: Significant Other, Adult Children Patient language and need for interpreter reviewed:: Yes Do you feel safe going back to the place where you live?: Yes      Need for Family Participation in Patient Care: No (Comment) Care giver support system in place?: Yes (comment) Current home services: Other (comment) (na) Criminal Activity/Legal Involvement Pertinent to Current Situation/Hospitalization: No - Comment as needed  Activities of Daily Living Home Assistive Devices/Equipment: CBG Meter ADL Screening (condition at time of admission) Patient's cognitive  ability adequate to safely complete daily activities?: Yes Is the patient deaf or have difficulty hearing?: No Does the patient have difficulty seeing, even when wearing glasses/contacts?: No Does the patient have difficulty concentrating, remembering, or making decisions?: No Patient able to express need for assistance with ADLs?: Yes Does the patient have difficulty dressing or bathing?: Yes (a little bit) Independently performs ADLs?: No Communication: Independent Dressing (OT): Needs assistance Is this a change from baseline?: Change from baseline, expected to last >3 days Grooming: Needs assistance Is this a change from baseline?: Change from baseline, expected to last >3 days Feeding: Independent Bathing: Needs assistance Is this a change from baseline?: Change from baseline, expected to last >3 days Toileting: Needs assistance Is this a change from baseline?: Change from baseline, expected to last >3days In/Out Bed: Independent with device (comment) Walks in Home: Needs assistance Is this a change from baseline?: Change from baseline, expected to last >3 days Does the patient have difficulty walking or climbing stairs?: Yes Weakness of Legs: Both Weakness of Arms/Hands: None  Permission Sought/Granted Permission sought to share information with : Family Supports Permission granted to share information with : Yes, Verbal Permission Granted  Share Information with NAME: aunt Stacey Lucas, daughter Stacey Lucas           Emotional Assessment Appearance:: Appears stated age Attitude/Demeanor/Rapport: Engaged Affect (typically observed): Appropriate, Pleasant Orientation: : Oriented  to Self, Oriented to Place, Oriented to  Time, Oriented to Situation Alcohol / Substance Use: Not Applicable Psych Involvement: No (comment)  Admission diagnosis:  Acute ischemic stroke [place] [facility]) [I63.9] Acute CVA (cerebrovascular accident) [facility] Lucas) [I63.9] Patient Active Problem List   Diagnosis  Date Noted   Acute CVA (cerebrovascular accident) ([place]) 05/25/2021   Cerebral thrombosis with cerebral infarction 05/24/2021   Osteoarthritis of right knee 05/24/2021   Type 2 diabetes mellitus without complication ([place]) 84/11/3531   Acute left-sided weakness 05/23/2021   HTN (hypertension) 05/23/2021   Hyperlipidemia 05/23/2021   Thyroid nodule 05/23/2021   MDD (major depressive disorder), recurrent severe, without psychosis ([place]) 04/27/2018   MDD (major depressive disorder), severe ([place]) 04/25/2018   PCP:  Fanny Bien, MD Pharmacy:   [facility] [place] [address] [place] [address] Phone: [phone] Fax: [phone]     Social Determinants of Health (SDOH) Interventions    Readmission Risk Interventions No flowsheet data found.

## 2021-05-25 NOTE — Progress Notes (Signed)
Inpatient Diabetes Program Recommendations  AACE/ADA: New Consensus Statement on Inpatient Glycemic Control (2015)  Target Ranges:  Prepandial:   less than 140 mg/dL      Peak postprandial:   less than 180 mg/dL (1-2 hours)      Critically ill patients:  140 - 180 mg/dL   Lab Results  Component Value Date   GLUCAP 146 (H) 05/25/2021   HGBA1C 8.6 (H) 05/23/2021    Review of Glycemic Control  Diabetes history: DM2  Current orders for Inpatient glycemic control: Semglee 20 units QD, Novolog 0-9 units TID with meals  Hypo this am of 70, 58, 65 mg/dL.  Inpatient Diabetes Program Recommendations:    Consider decreasing Semglee to 18 units QD.  Continue to follow.   Thank you. Ailene Ards, RD, LDN, CDE Inpatient Diabetes Coordinator [phone]

## 2021-05-25 NOTE — Progress Notes (Signed)
Physical Therapy Treatment Patient Details Name: Stacey Lucas MRN: [id number] DOB: 03/21/1976 Today's Date: 05/25/2021   History of Present Illness 45 y.o. female with medical history significant of HTN, hyperlipidemia, T2DM, depression who presented to ED with stroke like symptoms of left sided weakness, dizziness and speech deficits.  [id number]:[id number] or early subacute infarcts in the right corona radiata and  right periatrial white matter.    PT Comments    Pt much improved with progress toward goals.  Improved posture, balance, sit to stand, and quality/stability of gait.  Pt and husband feel good about being safe at home until pt able to work with therapies.   Recommendations for follow up therapy are one component of a multi-disciplinary discharge planning process, led by the attending physician.  Recommendations may be updated based on patient status, additional functional criteria and insurance authorization.  Follow Up Recommendations  Home health PT;Supervision - Intermittent;Supervision/Assistance - 24 hour     Equipment Recommendations  Rolling walker with 5" wheels    Recommendations for Other Services       Precautions / Restrictions Precautions Precautions: Fall Precaution Comments: history of right knee buckling     Mobility  Bed Mobility               General bed mobility comments: up at EOB on arrival    Transfers Overall transfer level: Needs assistance Equipment used: Rolling walker (2 wheeled) Transfers: Sit to/from Stand Sit to Stand: Min guard         General transfer comment: cues for hand placement and general walker safety for transfers.  Ambulation/Gait Ambulation/Gait assistance: Min guard;Min assist Gait Distance (Feet): 120 Feet Assistive device: Rolling walker (2 wheeled) Gait Pattern/deviations: Step-through pattern   Gait velocity interpretation: <1.8 ft/sec, indicate of risk for recurrent falls General Gait Details: mildly  unsteady, but notable improvement in the quality of gait with better control of right swing through and heel toe pattern.  Much lighter use of the RW   Stairs             Wheelchair Mobility    Modified Rankin (Stroke Patients Only) Modified Rankin (Stroke Patients Only) Modified Rankin: Moderate disability     Balance Overall balance assessment: Needs assistance Sitting-balance support: Feet supported Sitting balance-Leahy Scale: Good     Standing balance support: Single extremity supported;Bilateral upper extremity supported;During functional activity Standing balance-Leahy Scale: Poor Standing balance comment: needing RW                            Cognition Arousal/Alertness: Awake/alert Behavior During Therapy: WFL for tasks assessed/performed Overall Cognitive Status: Within Functional Limits for tasks assessed                                        Exercises      General Comments        Pertinent Vitals/Pain Pain Assessment: Faces Faces Pain Scale: No hurt Pain Intervention(s): Monitored during session    Home Living                      Prior Function            PT Goals (current goals can now be found in the care plan section) Acute Rehab PT Goals Patient Stated Goal: I want to get better PT Goal Formulation: With patient  Time For Goal Achievement: 06/07/21 Potential to Achieve Goals: Good Progress towards PT goals: Progressing toward goals    Frequency    Min 3X/week      PT Plan Discharge plan needs to be updated    Co-evaluation              AM-PAC PT "6 Clicks" Mobility   Outcome Measure  Help needed turning from your back to your side while in a flat bed without using bedrails?: A Little Help needed moving from lying on your back to sitting on the side of a flat bed without using bedrails?: A Little Help needed moving to and from a bed to a chair (including a wheelchair)?: A  Little Help needed standing up from a chair using your arms (e.g., wheelchair or bedside chair)?: A Little Help needed to walk in hospital room?: A Little Help needed climbing 3-5 steps with a railing? : A Lot 6 Click Score: 17    End of Session   Activity Tolerance: Patient tolerated treatment well Patient left: in bed;with call bell/phone within reach Nurse Communication: Mobility status PT Visit Diagnosis: Unsteadiness on feet (R26.81);Other abnormalities of gait and mobility (R26.89)     Time: 6387-5643 PT Time Calculation (min) (ACUTE ONLY): 17 min  Charges:  $Gait Training: 8-22 mins                     05/25/2021  Jacinto Halim., PT Acute Rehabilitation Services [phone]  (pager) [phone]  (office)   [name] 05/25/2021, 4:41 PM

## 2021-05-25 NOTE — Progress Notes (Signed)
Inpatient Rehab Admissions Coordinator:   Consult received.  I met with patient at the bedside to discuss recommendations for CIR.  I explained 3 hrs/day of therapy and average length of stay about 2 weeks.  Pt seems to have progressed very well with OT between yesterday and today, and is awaiting PT session today.  She feels she is doing much better and would prefer to d/c home if she can.  I will ask PT to consider this when they see her today.    I also reviewed that I would need insurance prior auth for CIR admit, and unfortunately, I cannot start a [employer] [place] case Friday for Monday admit (they are not open over the weekend).  Given that pt would prefer to d/c home and has support from her fiance, I let her know that I would f/u with on Monday if she didn't discharge home over the weekend.   Shann Medal, PT, [name] Admissions Coordinator [phone] 05/25/21  2:57 PM

## 2021-05-25 NOTE — Progress Notes (Signed)
PROGRESS NOTE    Stacey Lucas  WJX:[id number] DOB: August 10, 1976 DOA: 05/23/2021 PCP: Lewis Moccasin, MD   Brief Narrative:  This 45 years old female with PMH significant for hypertension, hyperlipidemia, type 2 diabetes, depression presented in the ED with strokelike symptoms.  Patient presented with left-sided weakness associated with dizziness and slurring of speech.  Patient also had some numbness during that episode.  Patient states she was waiting for uber and felt weak and fell around 9:30 AM.  She denied any loss of consciousness.  She does not remember anything afterwards what happened. Her fianc has called EMS and patient was brought in the ED.  She was unable to move her left leg and has left arm weakness. Patient does report frequent falls due to her knee arthritis and has been going to therapy. CT head showed  New right caudate infarct,  age indeterminant and potentially acute.  CTA head and neck no large vessel occlusion or significant stenosis in the head and neck area.  Patient was evaluated by the stroke team recommended dual antiplatelet therapy for 21 days followed by Plavix only.  Assessment & Plan:   Principal Problem:   Acute left-sided weakness Active Problems:   MDD (major depressive disorder), recurrent severe, without psychosis (HCC)   Type 2 diabetes mellitus without complication (HCC)   HTN (hypertension)   Hyperlipidemia   Thyroid nodule   Cerebral thrombosis with cerebral infarction   Osteoarthritis of right knee   Acute CVA (cerebrovascular accident) (HCC)  Left-sided weakness sec.to Acute CVA: Patient presented with left-sided weakness, dizziness and speech deficits,  CT head shows age indeterminant CVA, right caudate infarct. Continue telemetry. Patient was evaluated by stroke team recommended  stroke work-up. Continue frequent neurochecks. Echocardiogram LVEF 60 to 65%, no embolic source. LDL 245,  Continue Crestor 10 mg daily Neurology recommended  dual antiplatelet therapy(aspirin and Plavix) for 3 weeks followed by plavix only. PT and OT recommended CIR. Left-sided weakness has significantly improved. maximize medical therapy and encouraged smoking cessation.    Type 2 diabetes: Hemoglobin A1c 8.6, not controlled. Continue Semglee., hold Ozempic Continue regular insulin sliding scale. Dietitian consult.  Fingerstick remained low. Semglee reduced to 18 units.   Elevated creatinine: Does not meet definition of AKI. Continue IV hydration. Recheck a.m. labs   Hypertension: Allow permissive hypertension for 24 to 48 hours in the setting of possible acute stroke. Resume home blood pressure medication after 48 hours.  Hyperlipidemia: LDL 245.  Crestor 10 mg daily   Thyroid nodule: Incidentally found thyroid nodules on imaging Recommend outpatient thyroid ultrasound   Tobacco abuse Nicotine patch, encourage cessation.  Cocaine Use: Her urine drug screen positive for cocaine.   Patient was counseled about cessation. Social worker consult.   Body mass index is 25.09 kg/m.   DVT prophylaxis: Lovenox Code Status: Full code. Family Communication: Husband at bedside. Disposition Plan:    Status is: Inpatient  Remains inpatient appropriate because:Inpatient level of care appropriate due to severity of illness  Dispo: The patient is from: Home              Anticipated d/c is to: CIR              Patient currently is not medically stable to d/c.   Difficult to place patient No  Consultants:  Neurology  Procedures:  CVA workup  Antimicrobials:   Anti-infectives (From admission, onward)    None       Subjective: Patient was seen and examined  at bedside.  Overnight events noted.   Patient reports weakness and dizziness has resolved.  She denies using cocaine. It was explained to the patient in detail about refraining from substance use.  Objective: Vitals:   05/24/21 2152 05/25/21 0022 05/25/21 0705 05/25/21  1309  BP: (!) 159/74 (!) 145/77 (!) 156/63 (!) 166/73  Pulse: 94 64 (!) 102   Resp: 18 18 19    Temp: 98 F (36.7 C)  98.4 F (36.9 C) 97.8 F (36.6 C)  TempSrc: Oral  Oral Oral  SpO2: 100% 98% 100% 100%  Weight:      Height:        Intake/Output Summary (Last 24 hours) at 05/25/2021 1354 Last data filed at 05/24/2021 1830 Gross per 24 hour  Intake --  Output 400 ml  Net -400 ml   Filed Weights   05/23/21 1100  Weight: 68.4 kg    Examination:  General exam: Appears comfortable, not in any acute distress. Respiratory system: Clear to auscultation bilaterally, no wheezing. Cardiovascular system: S1-S2 heard, regular rate and rhythm, no murmur. Gastrointestinal system: Abdomen is soft, nontender, nondistended, BS+. Central nervous system: Alert and oriented x 3,  No focal neurological deficits. Weak left-sided grip. Extremities: No edema, no cyanosis, no clubbing. Skin: No rashes, lesions or ulcers Psychiatry: Judgement and insight appear normal. Mood & affect appropriate.     Data Reviewed: I have personally reviewed following labs and imaging studies  CBC: Recent Labs  Lab 05/23/21 1130 05/23/21 1133  WBC 12.1*  --   NEUTROABS 7.9*  --   HGB 14.1 14.3  HCT 41.6 42.0  MCV 78.6*  --   PLT 207  --    Basic Metabolic Panel: Recent Labs  Lab 05/23/21 1133 05/23/21 1220 05/24/21 0626  NA 136 137 137  K 6.4* 4.8 3.7  CL 107 104 102  CO2  --  24 23  GLUCOSE 122* 122* 119*  BUN 16 12 13   CREATININE 1.00 1.17* 1.14*  CALCIUM  --  8.1* 8.3*   GFR: Estimated Creatinine Clearance: 60.6 mL/min (A) (by C-G formula based on SCr of 1.14 mg/dL (H)). Liver Function Tests: Recent Labs  Lab 05/23/21 1220  AST 16  ALT 11  ALKPHOS 63  BILITOT 1.5*  PROT 4.9*  ALBUMIN 1.3*   No results for input(s): LIPASE, AMYLASE in the last 168 hours. No results for input(s): AMMONIA in the last 168 hours. Coagulation Profile: Recent Labs  Lab 05/23/21 1220  INR 1.1    Cardiac Enzymes: No results for input(s): CKTOTAL, CKMB, CKMBINDEX, TROPONINI in the last 168 hours. BNP (last 3 results) No results for input(s): PROBNP in the last 8760 hours. HbA1C: Recent Labs    05/23/21 1130  HGBA1C 8.6*   CBG: Recent Labs  Lab 05/25/21 0016 05/25/21 0513 05/25/21 0629 05/25/21 0804 05/25/21 1203  GLUCAP 70 58* 65* 146* 72   Lipid Profile: Recent Labs    05/23/21 1220  CHOL 341*  HDL 65  LDLCALC 245*  TRIG 154*  CHOLHDL 5.2   Thyroid Function Tests: No results for input(s): TSH, T4TOTAL, FREET4, T3FREE, THYROIDAB in the last 72 hours. Anemia Panel: No results for input(s): VITAMINB12, FOLATE, FERRITIN, TIBC, IRON, RETICCTPCT in the last 72 hours. Sepsis Labs: No results for input(s): PROCALCITON, LATICACIDVEN in the last 168 hours.  Recent Results (from the past 240 hour(s))  Resp Panel by RT-PCR (Flu A&B, Covid) Nasopharyngeal Swab     Status: None   Collection Time: 05/23/21 12:01  PM   Specimen: Nasopharyngeal Swab; Nasopharyngeal(NP) swabs in vial transport medium  Result Value Ref Range Status   SARS Coronavirus 2 by RT PCR NEGATIVE NEGATIVE Final    Comment: (NOTE) SARS-CoV-2 target nucleic acids are NOT DETECTED.  The SARS-CoV-2 RNA is generally detectable in upper respiratory specimens during the acute phase of infection. The lowest concentration of SARS-CoV-2 viral copies this assay can detect is 138 copies/mL. A negative result does not preclude SARS-Cov-2 infection and should not be used as the sole basis for treatment or other patient management decisions. A negative result may occur with  improper specimen collection/handling, submission of specimen other than nasopharyngeal swab, presence of viral mutation(s) within the areas targeted by this assay, and inadequate number of viral copies(<138 copies/mL). A negative result must be combined with clinical observations, patient history, and epidemiological information. The  expected result is Negative.  Fact Sheet for Patients:  BloggerCourse.com  Fact Sheet for Healthcare Providers:  SeriousBroker.it  This test is no t yet approved or cleared by the [place] FDA and  has been authorized for detection and/or diagnosis of SARS-CoV-2 by FDA under an Emergency Use Authorization (EUA). This EUA will remain  in effect (meaning this test can be used) for the duration of the COVID-19 declaration under Section 564(b)(1) of the Act, 21 U.S.C.section 360bbb-3(b)(1), unless the authorization is terminated  or revoked sooner.       Influenza A by PCR NEGATIVE NEGATIVE Final   Influenza B by PCR NEGATIVE NEGATIVE Final    Comment: (NOTE) The Xpert Xpress SARS-CoV-2/FLU/RSV plus assay is intended as an aid in the diagnosis of influenza from Nasopharyngeal swab specimens and should not be used as a sole basis for treatment. Nasal washings and aspirates are unacceptable for Xpert Xpress SARS-CoV-2/FLU/RSV testing.  Fact Sheet for Patients: BloggerCourse.com  Fact Sheet for Healthcare Providers: SeriousBroker.it  This test is not yet approved or cleared by the [place] FDA and has been authorized for detection and/or diagnosis of SARS-CoV-2 by FDA under an Emergency Use Authorization (EUA). This EUA will remain in effect (meaning this test can be used) for the duration of the COVID-19 declaration under Section 564(b)(1) of the Act, 21 U.S.C. section 360bbb-3(b)(1), unless the authorization is terminated or revoked.  Performed at [facility], [address], [place], [place] [address]     Radiology Studies: MR BRAIN WO CONTRAST  Result Date: 05/23/2021 CLINICAL DATA:  Neuro deficit, acute, stroke suspected EXAM: MRI HEAD WITHOUT CONTRAST TECHNIQUE: Multiplanar, multiecho pulse sequences of the brain and surrounding structures were obtained  without intravenous contrast. COMPARISON:  CT head from same day.  MRI head 04/14/2020. FINDINGS: Brain: Acute or early subacute infarcts in the right corona radiata and right periatrial white matter. No significant edema or mass effect. No acute hemorrhage, hydrocephalus, extra-axial collection or mass lesion. Moderate, age-advanced T2 hyperintensities within the white matter. Vascular: Major arterial flow voids are maintained at the skull base. Further evaluated on same day CTA. Skull and upper cervical spine: Normal marrow signal. Sinuses/Orbits: Remote left medial orbital wall fracture. Moderately extensive paranasal sinus mucosal thickening, greatest in the ethmoid air cells and maxillary sinuses. Other: Small left mastoid effusion. IMPRESSION: 1. Acute or early subacute infarcts in the right corona radiata and right periatrial white matter. No significant edema or mass effect. 2. Moderate, age-advanced T2 hyperintensities within the white matter, nonspecific but most likely related to chronic microvascular ischemic disease given the patient's risk factors (including diabetes, hypertension, and  hyperlipidemia). Prior demyelination is a differential consideration. 3. Moderately severe paranasal sinus mucosal thickening. Electronically Signed   By: Feliberto Harts M.D.   On: 05/23/2021 14:32   ECHOCARDIOGRAM COMPLETE  Result Date: 05/23/2021    ECHOCARDIOGRAM REPORT   Patient Name:   TYTIANA COLES Date of Exam: 05/23/2021 Medical Rec #:  [id number]    Height:       65.0 in Accession #:    [id number]   Weight:       150.8 lb Date of Birth:  07-01-1976   BSA:          1.754 m Patient Age:    44 years     BP:           144/58 mmHg Patient Gender: F            HR:           104 bpm. Exam Location:  Inpatient Procedure: 2D Echo, Cardiac Doppler and Color Doppler Indications:    CVA  History:        Patient has no prior history of Echocardiogram examinations.                 Risk Factors:Hypertension and Diabetes.   Sonographer:    MH Referring Phys: [id number] Reyne Dumas [facility] IMPRESSIONS  1. Left ventricular ejection fraction, by estimation, is 60 to 65%. The left ventricle has normal function. The left ventricle has no regional wall motion abnormalities. There is mild concentric left ventricular hypertrophy. Left ventricular diastolic parameters were normal.  2. Right ventricular systolic function is normal. The right ventricular size is normal.  3. The mitral valve is normal in structure. No evidence of mitral valve regurgitation. No evidence of mitral stenosis.  4. The aortic valve is tricuspid. Aortic valve regurgitation is not visualized. No aortic stenosis is present.  5. The inferior vena cava is normal in size with greater than 50% respiratory variability, suggesting right atrial pressure of 3 mmHg. FINDINGS  Left Ventricle: Left ventricular ejection fraction, by estimation, is 60 to 65%. The left ventricle has normal function. The left ventricle has no regional wall motion abnormalities. The left ventricular internal cavity size was normal in size. There is  mild concentric left ventricular hypertrophy. Left ventricular diastolic parameters were normal. Right Ventricle: The right ventricular size is normal. No increase in right ventricular wall thickness. Right ventricular systolic function is normal. Left Atrium: Left atrial size was normal in size. Right Atrium: Right atrial size was normal in size. Pericardium: There is no evidence of pericardial effusion. Mitral Valve: The mitral valve is normal in structure. No evidence of mitral valve regurgitation. No evidence of mitral valve stenosis. Tricuspid Valve: The tricuspid valve is normal in structure. Tricuspid valve regurgitation is trivial. No evidence of tricuspid stenosis. Aortic Valve: The aortic valve is tricuspid. Aortic valve regurgitation is not visualized. No aortic stenosis is present. Aortic valve mean gradient measures 4.0 mmHg. Aortic valve peak gradient  measures 6.7 mmHg. Aortic valve area, by VTI measures 2.85 cm. Pulmonic Valve: The pulmonic valve was normal in structure. Pulmonic valve regurgitation is not visualized. No evidence of pulmonic stenosis. Aorta: The aortic root is normal in size and structure. Venous: The inferior vena cava is normal in size with greater than 50% respiratory variability, suggesting right atrial pressure of 3 mmHg. IAS/Shunts: No atrial level shunt detected by color flow Doppler.  LEFT VENTRICLE PLAX 2D LVIDd:         3.80 cm  Diastology LVIDs:         2.60 cm     LV e' medial:  14.40 cm/s LV PW:         1.35 cm     LV e' lateral: 16.10 cm/s LV IVS:        1.09 cm LVOT diam:     2.00 cm LV SV:         73 LV SV Index:   42 LVOT Area:     3.14 cm  LV Volumes (MOD) LV vol d, MOD A4C: 44.7 ml LV vol s, MOD A4C: 20.2 ml LV SV MOD A4C:     44.7 ml RIGHT VENTRICLE             IVC RV S prime:     14.60 cm/s  IVC diam: 1.50 cm TAPSE (M-mode): 2.4 cm LEFT ATRIUM             Index       RIGHT ATRIUM           Index LA diam:        2.60 cm 1.48 cm/m  RA Area:     11.50 cm LA Vol (A2C):   49.1 ml 27.99 ml/m RA Volume:   21.80 ml  12.43 ml/m LA Vol (A4C):   34.6 ml 19.72 ml/m LA Biplane Vol: 42.9 ml 24.45 ml/m  AORTIC VALVE                   PULMONIC VALVE AV Area (Vmax):    2.61 cm    PV Vmax:       0.90 m/s AV Area (Vmean):   2.77 cm    PV Peak grad:  3.2 mmHg AV Area (VTI):     2.85 cm AV Vmax:           129.00 cm/s AV Vmean:          90.300 cm/s AV VTI:            0.256 m AV Peak Grad:      6.7 mmHg AV Mean Grad:      4.0 mmHg LVOT Vmax:         107.00 cm/s LVOT Vmean:        79.600 cm/s LVOT VTI:          0.232 m LVOT/AV VTI ratio: 0.91  AORTA Ao Root diam: 2.50 cm MITRAL VALVE MV Area (PHT): 4.58 cm SHUNTS                         Systemic VTI:  0.23 m                         Systemic Diam: 2.00 cm Chilton Si MD Electronically signed by Chilton Si MD Signature Date/Time: 05/23/2021/5:31:15 PM    Final     Scheduled  Meds:   stroke: mapping our early stages of recovery book   Does not apply Once   aspirin  81 mg Oral Daily   clopidogrel  75 mg Oral Daily   enoxaparin (LOVENOX) injection  40 mg Subcutaneous Q24H   insulin aspart  0-9 Units Subcutaneous TID WC   [START ON 05/26/2021] insulin glargine-yfgn  18 Units Subcutaneous Daily   living well with diabetes book   Does not apply Once   nicotine  21 mg Transdermal Daily   rosuvastatin  10 mg Oral QHS  senna-docusate  1 tablet Oral BID   Continuous Infusions:   LOS: 0 days    Time spent: 25 mins    Cipriano Bunker, MD [facility]   If 7PM-7AM, please contact night-coverage

## 2021-05-25 NOTE — Progress Notes (Signed)
Occupational Therapy Treatment Patient Details Name: Stacey Lucas MRN: [id number] DOB: [birth year] Today's Date: 05/25/2021   History of present illness 45 y.o. female with medical history significant of HTN, hyperlipidemia, T2DM, depression who presented to ED with stroke like symptoms of left sided weakness, dizziness and speech deficits.  LKG:MWNUU or early subacute infarcts in the right corona radiata and  right periatrial white matter.   OT comments  Patient seen be skilled OT to address grooming standing at sink, functional transfers, and HEP.  Patient was able to stand at sink for grooming with no vcs for tasks and min guard.  Transfers performed from recliner to chair to address toilet transfers with education on pushing up to stand and reaching back to sit.  HEP provided to patient and went over exercises with patient and family to increase carryover. Acute OT to continue to follow.    Recommendations for follow up therapy are one component of a multi-disciplinary discharge planning process, led by the attending physician.  Recommendations may be updated based on patient status, additional functional criteria and insurance authorization.    Follow Up Recommendations  CIR    Equipment Recommendations  Tub/shower seat    Recommendations for Other Services Rehab consult    Precautions / Restrictions Precautions Precautions: Fall Precaution Comments: history of right knee buckling       Mobility Bed Mobility               General bed mobility comments: up in recliner    Transfers Overall transfer level: Needs assistance Equipment used: Rolling walker (2 wheeled) Transfers: Sit to/from [employer] Sit to Stand: Min guard Stand pivot transfers: Min guard       General transfer comment: verbal cues for hand placement for sit to stand and reaching back to sit    Balance Overall balance assessment: Needs assistance Sitting-balance support: Feet  supported Sitting balance-Leahy Scale: Good     Standing balance support: Single extremity supported;During functional activity Standing balance-Leahy Scale: Poor Standing balance comment: needing RW and external support                           ADL either performed or assessed with clinical judgement   ADL Overall ADL's : Needs assistance/impaired     Grooming: Wash/dry hands;Wash/dry face;Oral care;Set up;Min guard;Standing Grooming Details (indicate cue type and reason): leaned on sink to help with balance                 Toilet Transfer: Min Administrator Details (indicate cue type and reason): simulated with recliener         Functional mobility during ADLs: Min guard;Rolling walker General ADL Comments: able to perform grooming tasks standing at sink     Vision       Perception     Praxis      Cognition Arousal/Alertness: Awake/alert Behavior During Therapy: WFL for tasks assessed/performed Overall Cognitive Status: Within Functional Limits for tasks assessed                                 General Comments: able to follow commands and recall strategies for safety        Exercises Exercises: General Upper Extremity General Exercises - Upper Extremity Shoulder Flexion: AROM;Both;10 reps;Seated Shoulder ABduction: AROM;Both;10 reps;Seated Elbow Flexion: AROM;Both;10 reps;Seated Elbow Extension: AROM;Both;10 reps;Seated   Shoulder Instructions  General Comments      Pertinent Vitals/ Pain       Pain Assessment: No/denies pain  Home Living                                          Prior Functioning/Environment              Frequency  Min 2X/week        Progress Toward Goals  OT Goals(current goals can now be found in the care plan section)  Progress towards OT goals: Progressing toward goals  Acute Rehab OT Goals Patient Stated Goal: I want to get better OT Goal  Formulation: With patient Time For Goal Achievement: 06/07/21 Potential to Achieve Goals: Good ADL Goals Pt Will Perform Grooming: with modified independence;sitting;standing Pt Will Perform Lower Body Bathing: with modified independence;sit to/from stand Pt Will Perform Lower Body Dressing: with modified independence;sit to/from stand Pt Will Transfer to Toilet: with modified independence;ambulating;regular height toilet Pt Will Perform Toileting - Clothing Manipulation and hygiene: with modified independence;sit to/from stand;sitting/lateral leans Pt Will Perform Tub/Shower Transfer: with modified independence;ambulating Pt/caregiver will Perform Home Exercise Program: Increased strength;Both right and left upper extremity;With theraband;With Supervision;With written HEP provided  Plan Discharge plan remains appropriate    Co-evaluation                 AM-PAC OT "6 Clicks" Daily Activity     Outcome Measure   Help from another person eating meals?: None Help from another person taking care of personal grooming?: A Little Help from another person toileting, which includes using toliet, bedpan, or urinal?: A Little Help from another person bathing (including washing, rinsing, drying)?: A Little Help from another person to put on and taking off regular upper body clothing?: None Help from another person to put on and taking off regular lower body clothing?: A Little 6 Click Score: 20    End of Session Equipment Utilized During Treatment: Rolling walker  OT Visit Diagnosis: Unsteadiness on feet (R26.81);Muscle weakness (generalized) (M62.81);History of falling (Z91.81);Pain   Activity Tolerance Patient tolerated treatment well   Patient Left in chair;with call bell/phone within reach;with family/visitor present   Nurse Communication Mobility status        Time: 7371-0626 OT Time Calculation (min): 31 min  Charges: OT General Charges $OT Visit: 1 Visit OT  Treatments $Self Care/Home Management : 8-22 mins $Therapeutic Exercise: 8-22 mins  Alfonse Flavors, OTA   Emmett Arntz Jeannett Senior 05/25/2021, 11:15 AM

## 2021-05-26 LAB — GLUCOSE, CAPILLARY
Glucose-Capillary: 88 mg/dL (ref 70–99)
Glucose-Capillary: 54 mg/dL — ABNORMAL LOW (ref 70–99)
Glucose-Capillary: 93 mg/dL (ref 70–99)
Glucose-Capillary: 254 mg/dL — ABNORMAL HIGH (ref 70–99)

## 2021-05-26 MED ORDER — ASPIRIN 81 MG PO CHEW: 81.0000 mg | CHEWABLE_TABLET | Freq: Every day | ORAL | 3 refills | Status: DC

## 2021-05-26 MED ORDER — CLOPIDOGREL BISULFATE 75 MG PO TABS: 75.0000 mg | ORAL_TABLET | Freq: Every day | ORAL | 1 refills | Status: DC

## 2021-05-26 MED ORDER — INSULIN GLARGINE-YFGN 100 UNIT/ML ~~LOC~~ SOLN
15.0000 [IU] | Freq: Every day | SUBCUTANEOUS | Status: DC
  Administered 2021-05-26: 15 [IU] via SUBCUTANEOUS
  Filled 2021-05-26 (×2): qty 0.15

## 2021-05-26 NOTE — Discharge Summary (Signed)
Physician Discharge Summary  Natisha Trzcinski RUE:[id number] DOB: Jan 25, 1976 DOA: 05/23/2021  PCP: Fanny Bien, MD  Admit date: 05/23/2021  Discharge date: 05/26/2021  Admitted From: Home.  Disposition:  Home Health services.  Recommendations for Outpatient Follow-up:  Follow up with PCP in 1-2 weeks. Please obtain BMP/CBC in one week. Advised dual antiplatelet therapy aspirin and Plavix for 3 weeks followed by Plavix only. Advised to refrain from tobacco and other substances including cocaine.  Home Health: Home health PT and OT. Equipment/Devices: None  Discharge Condition: Stable CODE STATUS:Full code Diet recommendation: Heart Healthy   Brief/Interim Summary: This 45 years old female with PMH significant for hypertension, hyperlipidemia, type 2 diabetes, depression presented in the ED with stroke like symptoms.  Patient presented with left-sided weakness associated with dizziness and slurring of speech.  Patient also had some numbness during that episode.  Patient states She was waiting for uber and felt weak and fell around 9:30 AM.  She denied any loss of consciousness.  She does not remember anything afterwards what happened. Her fianc has called EMS and patient was brought in the ED.  She was unable to move her left leg and has left arm weakness. Patient does report frequent falls due to her knee arthritis and has been going to therapy. CT head showed  New right caudate infarct,  age indeterminant and potentially acute.  CTA head and neck no large vessel occlusion or significant stenosis in the head and neck area.  Patient was evaluated by the stroke team recommended stroke work-up.  Patient was admitted for CVA.  Patient has complete CVA work-up.  Neurology recommended dual antiplatelet therapy for 21 days followed by Plavix only.  Urine drug screen positive for cocaine.  Echocardiogram unremarkable.  Patient feels better and was cleared from neurology to be discharged.  PT has  recommended CIR but patient has made significant improvement with ambulation.  Patient is being discharged home with home services.  Patient will follow up with neurology.  She was managed for below problems.   Discharge Diagnoses:  Principal Problem:   Acute left-sided weakness Active Problems:   MDD (major depressive disorder), recurrent severe, without psychosis ([place])   Type 2 diabetes mellitus without complication (HCC)   HTN (hypertension)   Hyperlipidemia   Thyroid nodule   Cerebral thrombosis with cerebral infarction   Osteoarthritis of right knee   Acute CVA (cerebrovascular accident) ([place])  Left-sided weakness sec.to Acute CVA: Patient presented with left-sided weakness, dizziness and speech deficits,  CT head shows age indeterminant CVA, right caudate infarct. Continue telemetry. Patient was evaluated by stroke team recommended  stroke work-up. Continue frequent neurochecks. Echocardiogram LVEF 60 to 14%, no embolic source. LDL 245,  Continue Crestor 10 mg daily Neurology recommended dual antiplatelet therapy(aspirin and Plavix) for 3 weeks followed by plavix only. PT and OT recommended CIR. Left-sided weakness has significantly improved. maximize medical therapy and encouraged smoking cessation.    Type 2 diabetes: Hemoglobin A1c 8.6, not controlled. Continue Semglee., hold Ozempic Continue regular insulin sliding scale. Dietitian consult.  Fingerstick remained low. Semglee reduced to 15 units.   Elevated creatinine: Does not meet definition of AKI. Continue IV hydration. Labs improved.   Hypertension: Allow permissive hypertension for 24 to 48 hours in the setting of possible acute stroke. Resume home blood pressure medication after 48 hours.   Hyperlipidemia: LDL 245.  Crestor 10 mg daily   Thyroid nodule: Incidentally found thyroid nodules on imaging Recommend outpatient thyroid ultrasound   Tobacco  abuse Nicotine patch, encourage cessation.    Cocaine Use: Her urine drug screen positive for cocaine.   Patient was counseled about cessation. Social worker consult.    Discharge Instructions  Discharge Instructions     Call MD for:  difficulty breathing, headache or visual disturbances   Complete by: As directed    Call MD for:  persistant dizziness or light-headedness   Complete by: As directed    Call MD for:  persistant nausea and vomiting   Complete by: As directed    Diet - low sodium heart healthy   Complete by: As directed    Diet Carb Modified   Complete by: As directed    Discharge instructions   Complete by: As directed    Patient has been admitted with CVA.   Advised dual antiplatelet therapy( aspirin and Plavix ) for 21 days followed by Plavix only. Patient was counseled about refraining from tobacco and other substances including cocaine.   Increase activity slowly   Complete by: As directed       Allergies as of 05/26/2021   No Known Allergies      Medication List     STOP taking these medications    furosemide 20 MG tablet Commonly known as: LASIX   gabapentin 300 MG capsule Commonly known as: Neurontin   lisinopril 5 MG tablet Commonly known as: ZESTRIL       TAKE these medications    aspirin 81 MG chewable tablet Chew 1 tablet (81 mg total) by mouth daily.   Basaglar KwikPen 100 UNIT/ML Inject 20 Units into the skin daily.   blood glucose meter kit and supplies Kit Check fasting blood sugar in the morning.   clopidogrel 75 MG tablet Commonly known as: PLAVIX Take 1 tablet (75 mg total) by mouth daily.   diclofenac Sodium 1 % Gel Commonly known as: VOLTAREN Apply 4 g topically daily as needed (For knee pain).   Ozempic (0.25 or 0.5 MG/DOSE) 2 MG/1.5ML Sopn Generic drug: Semaglutide(0.25 or 0.5MG/DOS) Inject 2 mg into the skin once a week.   Pen Needles 32G X 4 MM Misc 1 each by Does not apply route at bedtime.   rosuvastatin 10 MG tablet Commonly known as:  CRESTOR Take 10 mg by mouth at bedtime.               Durable Medical Equipment  (From admission, onward)           Start     Ordered   05/26/21 1047  For home use only DME 3 n 1  Once        05/26/21 1047   05/26/21 1047  For home use only DME Walker rolling  Once       Question Answer Comment  Walker: With [place] Wheels   Patient needs a walker to treat with the following condition Weakness      05/26/21 1047            Follow-up Information     Fanny Bien, MD Follow up in 1 week(s).   Specialty: Family Medicine Contact information: [address] [place] [address] [phone]         [facility]. Follow up in 4 week(s).   Contact information: [place] [address] [phone]         [facility] Follow up.   Specialty: Home Health Services Why: for home health services. they  will call you in 1-2 days to set up your first home appointment Contact information: [place] [address] [phone]                No Known Allergies  Consultations: Neurology   Procedures/Studies: MR BRAIN WO CONTRAST  Result Date: 05/23/2021 CLINICAL DATA:  Neuro deficit, acute, stroke suspected EXAM: MRI HEAD WITHOUT CONTRAST TECHNIQUE: Multiplanar, multiecho pulse sequences of the brain and surrounding structures were obtained without intravenous contrast. COMPARISON:  CT head from same day.  MRI head 04/14/2020. FINDINGS: Brain: Acute or early subacute infarcts in the right corona radiata and right periatrial white matter. No significant edema or mass effect. No acute hemorrhage, hydrocephalus, extra-axial collection or mass lesion. Moderate, age-advanced T2 hyperintensities within the white matter. Vascular: Major arterial flow voids are maintained at the skull base. Further evaluated on same day CTA. Skull and upper cervical spine: Normal marrow signal.  Sinuses/Orbits: Remote left medial orbital wall fracture. Moderately extensive paranasal sinus mucosal thickening, greatest in the ethmoid air cells and maxillary sinuses. Other: Small left mastoid effusion. IMPRESSION: 1. Acute or early subacute infarcts in the right corona radiata and right periatrial white matter. No significant edema or mass effect. 2. Moderate, age-advanced T2 hyperintensities within the white matter, nonspecific but most likely related to chronic microvascular ischemic disease given the patient's risk factors (including diabetes, hypertension, and hyperlipidemia). Prior demyelination is a differential consideration. 3. Moderately severe paranasal sinus mucosal thickening. Electronically Signed   By: Margaretha Sheffield M.D.   On: 05/23/2021 14:32   ECHOCARDIOGRAM COMPLETE  Result Date: 05/23/2021    ECHOCARDIOGRAM REPORT   Patient Name:   DORINA RIBAUDO Date of Exam: 05/23/2021 Medical Rec #:  [id number]    Height:       65.0 in Accession #:    [id number]   Weight:       150.8 lb Date of Birth:  05/01/1976   BSA:          1.754 m Patient Age:    48 years     BP:           144/58 mmHg Patient Gender: F            HR:           104 bpm. Exam Location:  Inpatient Procedure: 2D Echo, Cardiac Doppler and Color Doppler Indications:    CVA  History:        Patient has no prior history of Echocardiogram examinations.                 Risk Factors:Hypertension and Diabetes.  Sonographer:    [place] Referring Phys: [id number] Luis Lopez  1. Left ventricular ejection fraction, by estimation, is 60 to 65%. The left ventricle has normal function. The left ventricle has no regional wall motion abnormalities. There is mild concentric left ventricular hypertrophy. Left ventricular diastolic parameters were normal.  2. Right ventricular systolic function is normal. The right ventricular size is normal.  3. The mitral valve is normal in structure. No evidence of mitral valve regurgitation. No evidence  of mitral stenosis.  4. The aortic valve is tricuspid. Aortic valve regurgitation is not visualized. No aortic stenosis is present.  5. The inferior vena cava is normal in size with greater than 50% respiratory variability, suggesting right atrial pressure of 3 mmHg. FINDINGS  Left Ventricle: Left ventricular ejection fraction, by estimation, is 60 to 65%. The left ventricle has normal  function. The left ventricle has no regional wall motion abnormalities. The left ventricular internal cavity size was normal in size. There is  mild concentric left ventricular hypertrophy. Left ventricular diastolic parameters were normal. Right Ventricle: The right ventricular size is normal. No increase in right ventricular wall thickness. Right ventricular systolic function is normal. Left Atrium: Left atrial size was normal in size. Right Atrium: Right atrial size was normal in size. Pericardium: There is no evidence of pericardial effusion. Mitral Valve: The mitral valve is normal in structure. No evidence of mitral valve regurgitation. No evidence of mitral valve stenosis. Tricuspid Valve: The tricuspid valve is normal in structure. Tricuspid valve regurgitation is trivial. No evidence of tricuspid stenosis. Aortic Valve: The aortic valve is tricuspid. Aortic valve regurgitation is not visualized. No aortic stenosis is present. Aortic valve mean gradient measures 4.0 mmHg. Aortic valve peak gradient measures 6.7 mmHg. Aortic valve area, by VTI measures 2.85 cm. Pulmonic Valve: The pulmonic valve was normal in structure. Pulmonic valve regurgitation is not visualized. No evidence of pulmonic stenosis. Aorta: The aortic root is normal in size and structure. Venous: The inferior vena cava is normal in size with greater than 50% respiratory variability, suggesting right atrial pressure of 3 mmHg. IAS/Shunts: No atrial level shunt detected by color flow Doppler.  LEFT VENTRICLE PLAX 2D LVIDd:         3.80 cm     Diastology LVIDs:          2.60 cm     LV e' medial:  14.40 cm/s LV PW:         1.35 cm     LV e' lateral: 16.10 cm/s LV IVS:        1.09 cm LVOT diam:     2.00 cm LV SV:         73 LV SV Index:   42 LVOT Area:     3.14 cm  LV Volumes (MOD) LV vol d, MOD A4C: 44.7 ml LV vol s, MOD A4C: 20.2 ml LV SV MOD A4C:     44.7 ml RIGHT VENTRICLE             IVC RV S prime:     14.60 cm/s  IVC diam: 1.50 cm TAPSE (M-mode): 2.4 cm LEFT ATRIUM             Index       RIGHT ATRIUM           Index LA diam:        2.60 cm 1.48 cm/m  RA Area:     11.50 cm LA Vol (A2C):   49.1 ml 27.99 ml/m RA Volume:   21.80 ml  12.43 ml/m LA Vol (A4C):   34.6 ml 19.72 ml/m LA Biplane Vol: 42.9 ml 24.45 ml/m  AORTIC VALVE                   PULMONIC VALVE AV Area (Vmax):    2.61 cm    PV Vmax:       0.90 m/s AV Area (Vmean):   2.77 cm    PV Peak grad:  3.2 mmHg AV Area (VTI):     2.85 cm AV Vmax:           129.00 cm/s AV Vmean:          90.300 cm/s AV VTI:            0.256 m AV Peak Grad:  6.7 mmHg AV Mean Grad:      4.0 mmHg LVOT Vmax:         107.00 cm/s LVOT Vmean:        79.600 cm/s LVOT VTI:          0.232 m LVOT/AV VTI ratio: 0.91  AORTA Ao Root diam: 2.50 cm MITRAL VALVE MV Area (PHT): 4.58 cm SHUNTS                         Systemic VTI:  0.23 m                         Systemic Diam: 2.00 cm Skeet Latch MD Electronically signed by Skeet Latch MD Signature Date/Time: 05/23/2021/5:31:15 PM    Final    CT HEAD CODE STROKE WO CONTRAST  Result Date: 05/23/2021 CLINICAL DATA:  Code stroke. Neuro deficit, acute, stroke suspected. Left-sided weakness. EXAM: CT HEAD WITHOUT CONTRAST TECHNIQUE: Contiguous axial images were obtained from the base of the skull through the vertex without intravenous contrast. COMPARISON:  Head MRI 04/14/2020 FINDINGS: Brain: A lacunar infarct in the right caudate head is new and of indeterminate acuity. There is a new chronic infarct involving the left basal ganglia and corona radiata. Hypodensities elsewhere in the  cerebral white matter bilaterally are nonspecific but likely reflect chronic small-vessel ischemia given vascular risk factors. No acute intracranial hemorrhage, mass, midline shift, or extra-axial fluid collection is identified. Vascular: Calcified atherosclerosis at the skull base. No hyperdense vessel. Skull: No acute fracture or suspicious osseous lesion. Sinuses/Orbits: Remote medial left orbital blowout fracture. Moderately extensive right greater than left ethmoid air cell opacification. Circumferential mucosal thickening and secretions in the maxillary sinuses. Clear mastoid air cells. Other: None. ASPECTS ([place] Stroke Program Early CT Score) - Ganglionic level infarction (caudate, lentiform nuclei, internal capsule, insula, M1-M3 cortex): 6 - Supraganglionic infarction (M4-M6 cortex): 3 Total score (0-10 with 10 being normal): 9 IMPRESSION: 1. New right caudate infarct, age indeterminate but potentially acute. ASPECTS of 9. 2. New chronic left basal ganglia infarct. 3. No acute intracranial hemorrhage. These results were communicated to Dr. Theda Sers at 11:44 am on 05/23/2021 by text page via the [facility] messaging system. Electronically Signed   By: Logan Bores M.D.   On: 05/23/2021 11:46   CT ANGIO HEAD NECK W WO CM (CODE STROKE)  Result Date: 05/23/2021 CLINICAL DATA:  Neuro deficit, acute, stroke suspected. Left-sided weakness. EXAM: CT ANGIOGRAPHY HEAD AND NECK TECHNIQUE: Multidetector CT imaging of the head and neck was performed using the standard protocol during bolus administration of intravenous contrast. Multiplanar CT image reconstructions and MIPs were obtained to evaluate the vascular anatomy. Carotid stenosis measurements (when applicable) are obtained utilizing NASCET criteria, using the distal internal carotid diameter as the denominator. CONTRAST:  36m OMNIPAQUE IOHEXOL 350 MG/ML SOLN COMPARISON:  None. FINDINGS: CTA NECK FINDINGS Aortic arch: Standard 3 vessel aortic arch with mild  atherosclerotic plaque. Widely patent arch vessel origins. Right carotid system: Patent without evidence of stenosis, dissection, or significant atherosclerosis. Left carotid system: Patent without evidence of stenosis, dissection, or significant atherosclerosis. Vertebral arteries: Patent and codominant without evidence of stenosis, dissection, or significant atherosclerosis. Skeleton: Moderate cervical disc degeneration. Other neck: 1.8 cm complex right thyroid nodule. Upper chest: No apical lung consolidation or mass. Review of the MIP images confirms the above findings CTA HEAD FINDINGS Anterior circulation: The internal carotid arteries are widely patent from skull base  to carotid termini. ACAs and MCAs are patent without evidence of a proximal branch occlusion or significant proximal stenosis. No aneurysm is identified. Posterior circulation: The intracranial vertebral arteries are widely patent to the basilar. Patent AICA and SCA origins are identified bilaterally. The basilar artery is widely patent. Posterior communicating arteries are diminutive or absent. Both PCAs are patent without evidence of a significant proximal stenosis. No aneurysm is identified. Venous sinuses: Patent. Anatomic variants: None. Review of the MIP images confirms the above findings IMPRESSION: 1. No large vessel occlusion or significant stenosis in the head or neck. 2. 1.8 cm right thyroid nodule. Recommend thyroid US (ref: J Am Coll Radiol. 2015 Feb;12(2): 143-50). 3.  Aortic Atherosclerosis (ICD10-I70.0). These results were communicated to Dr. Theda Sers at 11:44 am on 05/23/2021 by text page via the [facility] messaging system. Electronically Signed   By: Logan Bores M.D.   On: 05/23/2021 11:54   CT head, MRI, echocardiogram.   Subjective: Patient was seen and examined at bedside.  Overnight events noted.   Patient reports feeling better.  Weakness has improved.  Patient wants to be discharged.  Discharge Exam: Vitals:    05/26/21 0639 05/26/21 1217  BP: (!) 147/72 (!) 157/83  Pulse: 91 98  Resp: 18 17  Temp: 98.1 F (36.7 C) 98.3 F (36.8 C)  SpO2: 99% 100%   Vitals:   05/25/21 1309 05/25/21 2201 05/26/21 0639 05/26/21 1217  BP: (!) 166/73 (!) 161/82 (!) 147/72 (!) 157/83  Pulse:  95 91 98  Resp:  '18 18 17  ' Temp: 97.8 F (36.6 C) 99.1 F (37.3 C) 98.1 F (36.7 C) 98.3 F (36.8 C)  TempSrc: Oral Oral Oral Oral  SpO2: 100% 100% 99% 100%  Weight:      Height:        General: Alert, comfortable, not in any acute distress. Cardiovascular: RRR, S1/S2 +, no rubs, no gallops Respiratory: CTA bilaterally, no wheezing, no rhonchi Abdominal: Soft, NT, ND, bowel sounds + Extremities: No edema, no cyanosis, no clubbing.    The results of significant diagnostics from this hospitalization (including imaging, microbiology, ancillary and laboratory) are listed below for reference.     Microbiology: Recent Results (from the past 240 hour(s))  Resp Panel by RT-PCR (Flu A&B, Covid) Nasopharyngeal Swab     Status: None   Collection Time: 05/23/21 12:01 PM   Specimen: Nasopharyngeal Swab; Nasopharyngeal(NP) swabs in vial transport medium  Result Value Ref Range Status   SARS Coronavirus 2 by RT PCR NEGATIVE NEGATIVE Final    Comment: (NOTE) SARS-CoV-2 target nucleic acids are NOT DETECTED.  The SARS-CoV-2 RNA is generally detectable in upper respiratory specimens during the acute phase of infection. The lowest concentration of SARS-CoV-2 viral copies this assay can detect is 138 copies/mL. A negative result does not preclude SARS-Cov-2 infection and should not be used as the sole basis for treatment or other patient management decisions. A negative result may occur with  improper specimen collection/handling, submission of specimen other than nasopharyngeal swab, presence of viral mutation(s) within the areas targeted by this assay, and inadequate number of viral copies(<138 copies/mL). A negative  result must be combined with clinical observations, patient history, and epidemiological information. The expected result is Negative.  Fact Sheet for Patients:  EntrepreneurPulse.com.au  Fact Sheet for Healthcare Providers:  IncredibleEmployment.be  This test is no t yet approved or cleared by the [place] FDA and  has been authorized for detection and/or diagnosis of SARS-CoV-2 by FDA under an  Emergency Use Authorization (EUA). This EUA will remain  in effect (meaning this test can be used) for the duration of the COVID-19 declaration under Section 564(b)(1) of the Act, 21 U.S.C.section 360bbb-3(b)(1), unless the authorization is terminated  or revoked sooner.       Influenza A by PCR NEGATIVE NEGATIVE Final   Influenza B by PCR NEGATIVE NEGATIVE Final    Comment: (NOTE) The Xpert Xpress SARS-CoV-2/FLU/RSV plus assay is intended as an aid in the diagnosis of influenza from Nasopharyngeal swab specimens and should not be used as a sole basis for treatment. Nasal washings and aspirates are unacceptable for Xpert Xpress SARS-CoV-2/FLU/RSV testing.  Fact Sheet for Patients: EntrepreneurPulse.com.au  Fact Sheet for Healthcare Providers: IncredibleEmployment.be  This test is not yet approved or cleared by the [place] FDA and has been authorized for detection and/or diagnosis of SARS-CoV-2 by FDA under an Emergency Use Authorization (EUA). This EUA will remain in effect (meaning this test can be used) for the duration of the COVID-19 declaration under Section 564(b)(1) of the Act, 21 U.S.C. section 360bbb-3(b)(1), unless the authorization is terminated or revoked.  Performed at [facility], [place] [address] [place], [place] [address]      Labs: BNP (last 3 results) Recent Labs    02/16/21 2158  BNP 37.4   Basic Metabolic Panel: Recent Labs  Lab 05/23/21 1133 05/23/21 1220  05/24/21 0626  NA 136 137 137  K 6.4* 4.8 3.7  CL 107 104 102  CO2  --  24 23  GLUCOSE 122* 122* 119*  BUN '16 12 13  ' CREATININE 1.00 1.17* 1.14*  CALCIUM  --  8.1* 8.3*   Liver Function Tests: Recent Labs  Lab 05/23/21 1220  AST 16  ALT 11  ALKPHOS 63  BILITOT 1.5*  PROT 4.9*  ALBUMIN 1.3*   No results for input(s): LIPASE, AMYLASE in the last 168 hours. No results for input(s): AMMONIA in the last 168 hours. CBC: Recent Labs  Lab 05/23/21 1130 05/23/21 1133  WBC 12.1*  --   NEUTROABS 7.9*  --   HGB 14.1 14.3  HCT 41.6 42.0  MCV 78.6*  --   PLT 207  --    Cardiac Enzymes: No results for input(s): CKTOTAL, CKMB, CKMBINDEX, TROPONINI in the last 168 hours. BNP: Invalid input(s): POCBNP CBG: Recent Labs  Lab 05/25/21 2200 05/26/21 0132 05/26/21 0804 05/26/21 0836 05/26/21 1101  GLUCAP 129* 88 54* 93 254*   D-Dimer No results for input(s): DDIMER in the last 72 hours. Hgb A1c No results for input(s): HGBA1C in the last 72 hours.  Lipid Profile No results for input(s): CHOL, HDL, LDLCALC, TRIG, CHOLHDL, LDLDIRECT in the last 72 hours.  Thyroid function studies No results for input(s): TSH, T4TOTAL, T3FREE, THYROIDAB in the last 72 hours.  Invalid input(s): FREET3 Anemia work up No results for input(s): VITAMINB12, FOLATE, FERRITIN, TIBC, IRON, RETICCTPCT in the last 72 hours. Urinalysis    Component Value Date/Time   COLORURINE YELLOW 05/24/2021 1830   APPEARANCEUR HAZY (A) 05/24/2021 1830   LABSPEC 1.018 05/24/2021 1830   PHURINE 6.0 05/24/2021 1830   GLUCOSEU 50 (A) 05/24/2021 1830   HGBUR NEGATIVE 05/24/2021 1830   BILIRUBINUR NEGATIVE 05/24/2021 1830   KETONESUR NEGATIVE 05/24/2021 1830   PROTEINUR >=300 (A) 05/24/2021 1830   NITRITE NEGATIVE 05/24/2021 1830   LEUKOCYTESUR NEGATIVE 05/24/2021 1830   Sepsis Labs Invalid input(s): PROCALCITONIN,  WBC,  LACTICIDVEN Microbiology Recent Results (from the past 240 hour(s))  Resp Panel by  RT-PCR (Flu A&B, Covid) Nasopharyngeal Swab     Status: None   Collection Time: 05/23/21 12:01 PM   Specimen: Nasopharyngeal Swab; Nasopharyngeal(NP) swabs in vial transport medium  Result Value Ref Range Status   SARS Coronavirus 2 by RT PCR NEGATIVE NEGATIVE Final    Comment: (NOTE) SARS-CoV-2 target nucleic acids are NOT DETECTED.  The SARS-CoV-2 RNA is generally detectable in upper respiratory specimens during the acute phase of infection. The lowest concentration of SARS-CoV-2 viral copies this assay can detect is 138 copies/mL. A negative result does not preclude SARS-Cov-2 infection and should not be used as the sole basis for treatment or other patient management decisions. A negative result may occur with  improper specimen collection/handling, submission of specimen other than nasopharyngeal swab, presence of viral mutation(s) within the areas targeted by this assay, and inadequate number of viral copies(<138 copies/mL). A negative result must be combined with clinical observations, patient history, and epidemiological information. The expected result is Negative.  Fact Sheet for Patients:  EntrepreneurPulse.com.au  Fact Sheet for Healthcare Providers:  IncredibleEmployment.be  This test is no t yet approved or cleared by the [place] FDA and  has been authorized for detection and/or diagnosis of SARS-CoV-2 by FDA under an Emergency Use Authorization (EUA). This EUA will remain  in effect (meaning this test can be used) for the duration of the COVID-19 declaration under Section 564(b)(1) of the Act, 21 U.S.C.section 360bbb-3(b)(1), unless the authorization is terminated  or revoked sooner.       Influenza A by PCR NEGATIVE NEGATIVE Final   Influenza B by PCR NEGATIVE NEGATIVE Final    Comment: (NOTE) The Xpert Xpress SARS-CoV-2/FLU/RSV plus assay is intended as an aid in the diagnosis of influenza from Nasopharyngeal swab  specimens and should not be used as a sole basis for treatment. Nasal washings and aspirates are unacceptable for Xpert Xpress SARS-CoV-2/FLU/RSV testing.  Fact Sheet for Patients: EntrepreneurPulse.com.au  Fact Sheet for Healthcare Providers: IncredibleEmployment.be  This test is not yet approved or cleared by the [place] FDA and has been authorized for detection and/or diagnosis of SARS-CoV-2 by FDA under an Emergency Use Authorization (EUA). This EUA will remain in effect (meaning this test can be used) for the duration of the COVID-19 declaration under Section 564(b)(1) of the Act, 21 U.S.C. section 360bbb-3(b)(1), unless the authorization is terminated or revoked.  Performed at [facility], [place] [address], [place], [place] [address]      Time coordinating discharge: Over 30 minutes  SIGNED:   Shawna Clamp, MD  [facility] 05/26/2021, 1:44 PM Pager   If 7PM-7AM, please contact night-coverage

## 2021-05-26 NOTE — TOC Transition Note (Signed)
Transition of Care [facility]) - CM/SW Discharge Note   Patient Details  Name: Stacey Lucas MRN: [id number] Date of Birth: 01-Mar-1976  [facility] CM/SW Contact:  Lawerance Sabal, RN Phone Number: 05/26/2021, 10:48 AM   Clinical Narrative:    Sherron Monday w patient who endorses needing a RW and 3/1. Orders placed and [employer] will deliver to room prior to DC Notified [facility] liaison of DC today. No other TOC needs identified.     Final next level of care: Home w Home Health Services Barriers to Discharge: No Barriers Identified   Patient Goals and CMS Choice Patient states their goals for this hospitalization and ongoing recovery are:: "back to what I was" CMS Medicare.gov Compare Post Acute Care list provided to:: Patient Choice offered to / list presented to : Patient  Discharge Placement                       Discharge Plan and Services     Post Acute Care Choice: Home Health          DME Arranged: 3-N-1, Walker rolling DME Agency: [employer] Date DME Agency Contacted: 05/26/21 Time DME Agency Contacted: 1048 Representative spoke with at DME Agency: Leavy Cella HH Arranged: PT, OT HH Agency: [facility] Date [facility] Agency Contacted: 05/26/21 Time HH Agency Contacted: 1048 Representative spoke with at [facility] Agency: Stacie  Social Determinants of Health (SDOH) Interventions     Readmission Risk Interventions No flowsheet data found.

## 2021-05-26 NOTE — Discharge Instructions (Signed)
Advised to take aspirin and Plavix dual antiplatelet therapy for 3 weeks followed by Plavix only. Patient was advised to refrain from tobacco and other substances including cocaine.

## 2021-05-28 ENCOUNTER — Ambulatory Visit: Payer: BC Managed Care – PPO

## 2021-05-29 LAB — POCT I-STAT, CHEM 8
BUN: 16 mg/dL (ref 6–20)
Calcium, Ion: 1 mmol/L — ABNORMAL LOW (ref 1.15–1.40)
Chloride: 107 mmol/L (ref 98–111)
Creatinine, Ser: 1 mg/dL (ref 0.44–1.00)
Glucose, Bld: 122 mg/dL — ABNORMAL HIGH (ref 70–99)
HCT: 42 % (ref 36.0–46.0)
Hemoglobin: 14.3 g/dL (ref 12.0–15.0)
Potassium: 6.4 mmol/L (ref 3.5–5.1)
Sodium: 136 mmol/L (ref 135–145)
TCO2: 24 mmol/L (ref 22–32)

## 2021-05-30 ENCOUNTER — Ambulatory Visit: Payer: BC Managed Care – PPO

## 2021-06-18 ENCOUNTER — Encounter: Payer: BC Managed Care – PPO | Admitting: Physical Therapy

## 2021-06-22 ENCOUNTER — Encounter (HOSPITAL_COMMUNITY): Payer: Self-pay | Admitting: Radiology

## 2021-07-04 DIAGNOSIS — I480 Paroxysmal atrial fibrillation: Secondary | ICD-10-CM | POA: Insufficient documentation

## 2021-07-04 DIAGNOSIS — Z8673 Personal history of transient ischemic attack (TIA), and cerebral infarction without residual deficits: Secondary | ICD-10-CM | POA: Insufficient documentation

## 2021-07-04 DIAGNOSIS — I4891 Unspecified atrial fibrillation: Secondary | ICD-10-CM | POA: Insufficient documentation

## 2021-07-04 NOTE — Progress Notes (Signed)
No show

## 2021-07-05 ENCOUNTER — Ambulatory Visit: Payer: Self-pay | Admitting: Cardiology

## 2021-07-05 NOTE — Progress Notes (Signed)
These appear to be scanned in under the wrong patient. Can you please address this?

## 2021-07-05 NOTE — Progress Notes (Signed)
External labs 06/08/2021: Glucose 223, BUN 44, creatinine 2.5, GFR 24, sodium 135, potassium 5.5, Hemoglobin 10.4

## 2021-07-06 DIAGNOSIS — Z91199 Patient's noncompliance with other medical treatment and regimen due to unspecified reason: Secondary | ICD-10-CM | POA: Insufficient documentation

## 2021-07-12 ENCOUNTER — Ambulatory Visit: Payer: BC Managed Care – PPO | Admitting: Cardiology

## 2021-07-18 ENCOUNTER — Inpatient Hospital Stay: Payer: BC Managed Care – PPO | Admitting: Adult Health

## 2021-07-18 ENCOUNTER — Encounter: Payer: Self-pay | Admitting: Adult Health

## 2021-07-18 NOTE — Progress Notes (Deleted)
[facility] [address] [place]. [place] [address] [phone]       HOSPITAL FOLLOW UP NOTE  [name] Date of Birth:  [birth year] Medical Record Number:  [id number]   Reason for Referral:  hospital stroke follow up    SUBJECTIVE:   CHIEF COMPLAINT:  No chief complaint on file.   HPI:   [name] is a 45 y.o. female with history of type 2 diabetes mellitus, essential hypertension, hyperlipidemia, migraine headaches, and sciatica (?> cause of chronic right sided weakness) who presented on 05/23/2021 with left sided weakness, speech changes, nausea, and vomiting.  Personally reviewed hospitalization pertinent progress notes, lab work and imaging.  Evaluated by Dr. Leonie Man for right caudate infarct likely secondary to small vessel disease.  CTA head/neck no LVO or significant stenosis.  EF 60 to 65%.  LDL 245.  A1c 8.6. UDS + cocaine.  Recommended DAPT for 3 weeks and aspirin plavix *** alone as well as continuation of home dose Crestor 10 mg daily.  Current tobacco and EtOH use with cessation counseling provided.  Prior history of stroke and migraine headaches.  Incidental finding of thyroid nodule and nasal mucosal thickening on imaging and advised follow-up with PCP for further eval.  Therapy eval's initially recommended CIR but made significant improvement and discharged home with home services.        PERTINENT IMAGING/LABS  Code Stroke CT head: lacunar right caudate infarction of undetermined age, Left basal ganglia infarcts, as well as in corona radiata. Small vessel ischemic changes throughout white matter. ASPECTS 9  CTA head & neck unremarkable for vessel stenosis or occlusions.  CT perfusion n/a  MRI  acute to subacute appearing infarcts of right corona radiata and periatrial white matter. Chronic microvascular ischemic changes throuhgout white matter.  2D Echo LA size normal, ef 60-65%. No regional wall abnormalities. LVH.  LDL  245 HgbA1c 8.6     ROS:   14 system review of systems performed and negative with exception of ***  PMH:  Past Medical History:  Diagnosis Date   Anxiety and depression    Diabetes mellitus without complication (HCC)    Type II   GERD (gastroesophageal reflux disease)    Hyperlipidemia    Hypertension    Impingement syndrome of right shoulder    Migraine    Osteoarthritis of right knee 05/24/2021   Pyelonephritis    Sciatica     PSH:  Past Surgical History:  Procedure Laterality Date   tubal      Social History:  Social History   Socioeconomic History   Marital status: Single    Spouse name: Not on file   Number of children: 3   Years of education: Not on file   Highest education level: Not on file  Occupational History   Not on file  Tobacco Use   Smoking status: Every Day    Packs/day: 1.00    Types: Cigarettes   Smokeless tobacco: Never  Vaping Use   Vaping Use: Never used  Substance and Sexual Activity   Alcohol use: Yes    Comment: occasionally   Drug use: No   Sexual activity: Yes    Birth control/protection: None  Other Topics Concern   Not on file  Social History Narrative   Lives with children   "lots of caffeine"   Social Determinants of Health   Financial Resource Strain: Not on file  Food Insecurity: Not on file  Transportation Needs: Not on file  Physical Activity: Not on file  Stress: Not on file  Social Connections: Not on file  Intimate Partner Violence: Not on file    Family History:  Family History  Problem Relation Age of Onset   Diabetes Mother    Hypertension Mother    Arthritis Mother    Diabetes Father    Hypertension Father     Medications:   Current Outpatient Medications on File Prior to Visit  Medication Sig Dispense Refill   aspirin 81 MG chewable tablet Chew 1 tablet (81 mg total) by mouth daily. 30 tablet 3   blood glucose meter kit and supplies KIT Check fasting blood sugar in the morning. 1 each 0    clopidogrel (PLAVIX) 75 MG tablet Take 1 tablet (75 mg total) by mouth daily. 30 tablet 1   diclofenac Sodium (VOLTAREN) 1 % GEL Apply 4 g topically daily as needed (For knee pain).     Insulin Glargine (BASAGLAR KWIKPEN) 100 UNIT/ML SOPN Inject 20 Units into the skin daily.     Insulin Pen Needle (PEN NEEDLES) 32G X 4 MM MISC 1 each by Does not apply route at bedtime. 100 each 3   OZEMPIC, 0.25 OR 0.5 MG/DOSE, 2 MG/1.5ML SOPN Inject 2 mg into the skin once a week.     rosuvastatin (CRESTOR) 10 MG tablet Take 10 mg by mouth at bedtime.     No current facility-administered medications on file prior to visit.    Allergies:  No Known Allergies    OBJECTIVE:  Physical Exam  There were no vitals filed for this visit. There is no height or weight on file to calculate BMI. No results found.  Depression screen [facility] 2/9 11/28/2017  Decreased Interest 0  Down, Depressed, Hopeless 0  PHQ - 2 Score 0     General: well developed, well nourished, seated, in no evident distress Head: head normocephalic and atraumatic.   Neck: supple with no carotid or supraclavicular bruits Cardiovascular: regular rate and rhythm, no murmurs Musculoskeletal: no deformity Skin:  no rash/petichiae Vascular:  Normal pulses all extremities   Neurologic Exam Mental Status: Awake and fully alert. Oriented to place and time. Recent and remote memory intact. Attention span, concentration and fund of knowledge appropriate. Mood and affect appropriate.  Cranial Nerves: Fundoscopic exam reveals sharp disc margins. Pupils equal, briskly reactive to light. Extraocular movements full without nystagmus. Visual fields full to confrontation. Hearing intact. Facial sensation intact. Face, tongue, palate moves normally and symmetrically.  Motor: Normal bulk and tone. Normal strength in all tested extremity muscles Sensory.: intact to touch , pinprick , position and vibratory sensation.  Coordination: Rapid alternating movements  normal in all extremities. Finger-to-nose and heel-to-shin performed accurately bilaterally. Gait and Station: Arises from chair without difficulty. Stance is normal. Gait demonstrates normal stride length and balance with ***. Tandem walk and heel toe ***.  Reflexes: 1+ and symmetric. Toes downgoing.     NIHSS  *** Modified Rankin  ***      ASSESSMENT: Stacey Lucas is a 45 y.o. year old female with right caudate infarct on 05/23/2021 likely secondary to small vessel disease in setting of left-sided weakness, speech changes and N/V. Vascular risk factors include DM, HTN, HLD, migraine headaches, polysubstance abuse (tobacco use, EtOH use, and cocaine use) and prior stroke.      PLAN:  Ischemic stroke: Residual deficit: ***. Continue {anticoagulants:31417}  and ***  for secondary stroke prevention.  Discussed secondary stroke prevention measures and importance of close PCP  follow up for aggressive stroke risk factor management. I have gone over the pathophysiology of stroke, warning signs and symptoms, risk factors and their management in some detail with instructions to go to the closest emergency room for symptoms of concern. HTN: BP goal <130/90.  Stable on *** per PCP HLD: LDL goal <70. Recent LDL 245.  DMII: A1c goal<7.0. Recent A1c 8.6.  Polysubstance abuse:     Follow up in *** or call earlier if needed   CC:  [facility] provider: Dr. Leonie Man PCP: Fanny Bien, MD    I spent *** minutes of face-to-face and non-face-to-face time with patient.  This included previsit chart review including review of recent hospitalization, lab review, study review, order entry, electronic health record documentation, patient education regarding recent stroke including etiology, secondary stroke prevention measures and importance of managing stroke risk factors, residual deficits and typical recovery time and answered all other questions to patient satisfaction   Frann Rider,  AGNP-BC  [facility] [address] [place], [place] [address]  Phone [phone] Fax [phone] Note: This document was prepared with digital dictation and possible smart phrase technology. Any transcriptional errors that result from this process are unintentional.

## 2021-10-26 ENCOUNTER — Ambulatory Visit (INDEPENDENT_AMBULATORY_CARE_PROVIDER_SITE_OTHER): Payer: BC Managed Care – PPO | Admitting: Internal Medicine

## 2021-10-26 DIAGNOSIS — E1159 Type 2 diabetes mellitus with other circulatory complications: Secondary | ICD-10-CM

## 2021-10-26 DIAGNOSIS — I700 Atherosclerosis of aorta: Secondary | ICD-10-CM

## 2021-10-26 DIAGNOSIS — I4891 Unspecified atrial fibrillation: Secondary | ICD-10-CM

## 2021-10-26 DIAGNOSIS — Z794 Long term (current) use of insulin: Secondary | ICD-10-CM

## 2021-10-26 DIAGNOSIS — E1169 Type 2 diabetes mellitus with other specified complication: Secondary | ICD-10-CM

## 2021-10-26 DIAGNOSIS — I152 Hypertension secondary to endocrine disorders: Secondary | ICD-10-CM

## 2021-10-26 DIAGNOSIS — I639 Cerebral infarction, unspecified: Secondary | ICD-10-CM

## 2021-10-26 DIAGNOSIS — E785 Hyperlipidemia, unspecified: Secondary | ICD-10-CM

## 2021-10-26 DIAGNOSIS — E118 Type 2 diabetes mellitus with unspecified complications: Secondary | ICD-10-CM

## 2021-10-26 NOTE — Progress Notes (Signed)
Cardiology Office Note:    Date:  11/22/2022   ID:  Stacey Lucas, DOB Nov 15, 1975, MRN [id number]  PCP:  Elsie Stain, MD   [facility] Cardiologist:  Lenna Sciara, MD Referring MD: Fanny Bien, MD   Chief Complaint/Reason for Referral:  Atrial fibrillation  PATIENT DID NOT APPEAR FOR APPOINTMENT   ASSESSMENT:    Atrial fibrillation, unspecified type ([place])  Type 2 diabetes mellitus with complication, with long-term current use of insulin ([place])  Hyperlipidemia associated with type 2 diabetes mellitus ([place])  Hypertension associated with diabetes ([place])  Acute CVA (cerebrovascular accident) ([place])  Aortic atherosclerosis ([place])    PLAN:    In order of problems listed above:         History of Present Illness:            Previous Medical History: Past Medical History:  Diagnosis Date   Anxiety and depression    Cocaine abuse ([place])    Depression with suicidal ideation    [place] admission 04/2018   Diabetes mellitus without complication (HCC)    Type II   GERD (gastroesophageal reflux disease)    Hyperlipidemia    Hypertension    Impingement syndrome of right shoulder    Migraine headache    Osteoarthritis of right knee 05/24/2021   Pilonidal abscess 05/2013   Pyelonephritis    Right thyroid nodule    Sciatica      Current Medications: No outpatient medications have been marked as taking for the 10/26/21 encounter (Office Visit) with Early Osmond, MD.     Allergies:    Gabapentin   Social History:   Social History   Tobacco Use   Smoking status: Every Day    Packs/day: 1    Types: Cigarettes    Last attempt to quit: 01/16/2022    Years since quitting: 0.8    Passive exposure: Current   Smokeless tobacco: Never  Vaping Use   Vaping Use: Never used  Substance Use Topics   Alcohol use: Yes    Comment: occasionally   Drug use: No     Family Hx: Family History  Problem Relation Age of Onset   Diabetes  Mother    Hypertension Mother    Arthritis Mother    Diabetes Father    Hypertension Father      Review of Systems:   Please see the history of present illness.    All other systems reviewed and are negative.     EKGs/Labs/Other Test Reviewed:    EKG: EKG from March 2021 demonstrates atrial fibrillation with rapid ventricular rate  Prior CV studies:  TTE 9/22:  1. Left ventricular ejection fraction, by estimation, is 60 to 65%. The  left ventricle has normal function. The left ventricle has no regional  wall motion abnormalities. There is mild concentric left ventricular  hypertrophy. Left ventricular diastolic  parameters were normal.   2. Right ventricular systolic function is normal. The right ventricular  size is normal.   3. The mitral valve is normal in structure. No evidence of mitral valve  regurgitation. No evidence of mitral stenosis.   4. The aortic valve is tricuspid. Aortic valve regurgitation is not  visualized. No aortic stenosis is present.   5. The inferior vena cava is normal in size with greater than 50%  respiratory variability, suggesting right atrial pressure of 3 mmHg.  6.  No intracardiac shunts  Imaging studies that I have independently reviewed today:   Recent Labs:  11/14/2022: ALT 12 11/21/2022: BUN 44; Creatinine, Ser 4.99; Hemoglobin 8.0; Platelets 242; Potassium 4.2; Sodium 142   Recent Lipid Panel Lab Results  Component Value Date/Time   CHOL 190 11/15/2022 06:18 AM   CHOL 218 (H) 03/05/2022 10:45 AM   TRIG 205 (H) 11/15/2022 06:18 AM   HDL 50 11/15/2022 06:18 AM   HDL 76 03/05/2022 10:45 AM   LDLCALC 99 11/15/2022 06:18 AM   LDLCALC 120 (H) 03/05/2022 10:45 AM    Risk Assessment/Calculations:     CHA2DS2-VASc Score =     This indicates a  % annual risk of stroke. The patient's score is based upon:           Physical Exam:      Signed, Early Osmond, MD  11/22/2022 4:03 PM    [facility] [place], [place], [place]  [address] Phone: [phone]; Fax: [phone]   Note:  This document was prepared using Dragon voice recognition software and may include unintentional dictation errors.

## 2021-12-08 ENCOUNTER — Encounter (HOSPITAL_COMMUNITY): Payer: Self-pay | Admitting: Emergency Medicine

## 2021-12-08 ENCOUNTER — Emergency Department (HOSPITAL_COMMUNITY): Payer: BC Managed Care – PPO

## 2021-12-08 ENCOUNTER — Emergency Department (HOSPITAL_COMMUNITY)
Admission: EM | Admit: 2021-12-08 | Discharge: 2021-12-08 | Disposition: A | Discharge status: Home / Self Care | Payer: BC Managed Care – PPO | Attending: Emergency Medicine | Admitting: Emergency Medicine

## 2021-12-08 ENCOUNTER — Other Ambulatory Visit: Payer: Self-pay

## 2021-12-08 DIAGNOSIS — R7989 Other specified abnormal findings of blood chemistry: Secondary | ICD-10-CM | POA: Insufficient documentation

## 2021-12-08 DIAGNOSIS — H53149 Visual discomfort, unspecified: Secondary | ICD-10-CM | POA: Insufficient documentation

## 2021-12-08 DIAGNOSIS — D72829 Elevated white blood cell count, unspecified: Secondary | ICD-10-CM | POA: Insufficient documentation

## 2021-12-08 DIAGNOSIS — Z79899 Other long term (current) drug therapy: Secondary | ICD-10-CM | POA: Insufficient documentation

## 2021-12-08 DIAGNOSIS — E119 Type 2 diabetes mellitus without complications: Secondary | ICD-10-CM | POA: Diagnosis not present

## 2021-12-08 DIAGNOSIS — R519 Headache, unspecified: Secondary | ICD-10-CM | POA: Insufficient documentation

## 2021-12-08 DIAGNOSIS — I10 Essential (primary) hypertension: Secondary | ICD-10-CM | POA: Insufficient documentation

## 2021-12-08 LAB — COMPREHENSIVE METABOLIC PANEL
ALT: 7 U/L (ref 0–44)
AST: 19 U/L (ref 15–41)
Albumin: 1.6 g/dL — ABNORMAL LOW (ref 3.5–5.0)
Alkaline Phosphatase: 55 U/L (ref 38–126)
Anion gap: 6 (ref 5–15)
BUN: 16 mg/dL (ref 6–20)
CO2: 24 mmol/L (ref 22–32)
Calcium: 8.6 mg/dL — ABNORMAL LOW (ref 8.9–10.3)
Chloride: 110 mmol/L (ref 98–111)
Creatinine, Ser: 1.27 mg/dL — ABNORMAL HIGH (ref 0.44–1.00)
GFR, Estimated: 53 mL/min — ABNORMAL LOW (ref >60)
Glucose, Bld: 107 mg/dL — ABNORMAL HIGH (ref 70–99)
Potassium: 3.7 mmol/L (ref 3.5–5.1)
Sodium: 140 mmol/L (ref 135–145)
Total Bilirubin: 0.6 mg/dL (ref 0.3–1.2)
Total Protein: 4.9 g/dL — ABNORMAL LOW (ref 6.5–8.1)

## 2021-12-08 LAB — CBC WITH DIFFERENTIAL/PLATELET
Abs Immature Granulocytes: 0.04 10*3/uL (ref 0.00–0.07)
Basophils Absolute: 0.1 10*3/uL (ref 0.0–0.1)
Basophils Relative: 1 %
Eosinophils Absolute: 0.4 10*3/uL (ref 0.0–0.5)
Eosinophils Relative: 4 %
HCT: 39.7 % (ref 36.0–46.0)
Hemoglobin: 13.1 g/dL (ref 12.0–15.0)
Immature Granulocytes: 0 %
Lymphocytes Relative: 34 %
Lymphs Abs: 3.7 10*3/uL (ref 0.7–4.0)
MCH: 25.9 pg — ABNORMAL LOW (ref 26.0–34.0)
MCHC: 33 g/dL (ref 30.0–36.0)
MCV: 78.5 fL — ABNORMAL LOW (ref 80.0–100.0)
Monocytes Absolute: 0.9 10*3/uL (ref 0.1–1.0)
Monocytes Relative: 8 %
Neutro Abs: 5.6 10*3/uL (ref 1.7–7.7)
Neutrophils Relative %: 53 %
Platelets: 301 10*3/uL (ref 150–400)
RBC: 5.06 MIL/uL (ref 3.87–5.11)
RDW: 15.9 % — ABNORMAL HIGH (ref 11.5–15.5)
WBC: 10.7 10*3/uL — ABNORMAL HIGH (ref 4.0–10.5)
nRBC: 0 % (ref 0.0–0.2)

## 2021-12-08 MED ORDER — DIPHENHYDRAMINE HCL 50 MG/ML IJ SOLN
25.0000 mg | Freq: Once | INTRAMUSCULAR | Status: AC
  Administered 2021-12-08: 25 mg via INTRAVENOUS
  Filled 2021-12-08: qty 1

## 2021-12-08 MED ORDER — PROCHLORPERAZINE EDISYLATE 10 MG/2ML IJ SOLN
10.0000 mg | Freq: Once | INTRAMUSCULAR | Status: AC
  Administered 2021-12-08: 10 mg via INTRAVENOUS
  Filled 2021-12-08: qty 2

## 2021-12-08 MED ORDER — SODIUM CHLORIDE 0.9 % IV BOLUS
1000.0000 mL | Freq: Once | INTRAVENOUS | Status: AC
  Administered 2021-12-08: 1000 mL via INTRAVENOUS

## 2021-12-08 MED ORDER — KETOROLAC TROMETHAMINE 15 MG/ML IJ SOLN
15.0000 mg | Freq: Once | INTRAMUSCULAR | Status: AC
  Administered 2021-12-08: 15 mg via INTRAVENOUS
  Filled 2021-12-08: qty 1

## 2021-12-08 NOTE — Discharge Instructions (Addendum)
Your laboratories were within normal limits today. ? ?You will need to continue taking your medication to help with your blood pressure. ? ?Please follow-up with your primary care physician as needed. ?

## 2021-12-08 NOTE — ED Provider Notes (Signed)
?[facility] ?Provider Note ? ? ?CSN: [id number] ?Arrival date & time: 12/08/21  1203 ? ?  ? ?History ? ?Chief Complaint  ?Patient presents with  ? Headache  ? ? ?Stacey Lucas is a 46 y.o. female. ? ?46 y.o female with a PMH of CVA, DM, HTN presents to the ED via EMS with a chief complaint of headache x yesterday.  Patient reports similar headaches in the past, however also states that she never gets a headache.  She did not take anything for her headache.  Does report taking her blood pressure medication today.  She also endorses some nausea but has not had any episodes of vomiting.  No trauma.  No visual changes. ? ? ?The history is provided by the patient and medical records.  ?Headache ?Pain location:  Frontal ?Quality:  Unable to specify ?Radiates to:  Does not radiate ?Severity currently:  10/10 ?Severity at highest:  10/10 ?Onset quality:  Gradual ?Duration:  1 day ?Timing:  Intermittent ?Progression:  Worsening ?Chronicity:  New ?Similar to prior headaches: no   ?Context: bright light and loud noise   ?Relieved by:  Nothing ?Ineffective treatments:  None tried ?Associated symptoms: nausea, photophobia and vomiting   ?Associated symptoms: no fever and no sore throat   ? ?  ? ?Home Medications ?Prior to Admission medications   ?Medication Sig Start Date End Date Taking? Authorizing Provider  ?azelastine (ASTELIN) 0.1 % nasal spray Place 1-2 sprays into both nostrils 2 (two) times daily as needed for allergies. 09/11/21  Yes [provider]  ?diclofenac Sodium (VOLTAREN) 1 % GEL Apply 4 g topically daily as needed (For knee pain). 04/14/21  Yes [provider]  ?metoprolol succinate (TOPROL-XL) 25 MG 24 hr tablet Take 25 mg by mouth daily. 12/07/21  Yes [provider]  ?rosuvastatin (CRESTOR) 10 MG tablet Take 10 mg by mouth at bedtime. 03/07/21  Yes [provider]  ?valsartan (DIOVAN) 160 MG tablet Take 160 mg by mouth daily. 12/07/21  Yes  [provider]  ?blood glucose meter kit and supplies KIT Check fasting blood sugar in the morning. 11/27/17   Jaynee Eagles, PA-C  ?clopidogrel (PLAVIX) 75 MG tablet Take 1 tablet (75 mg total) by mouth daily. ?Patient not taking: Reported on 12/08/2021 05/26/21   Shawna Clamp, MD  ?Insulin Pen Needle (PEN NEEDLES) 32G X 4 MM MISC 1 each by Does not apply route at bedtime. 01/30/18   Jaynee Eagles, PA-C  ?   ? ?Allergies    ?Patient has no known allergies.   ? ?Review of Systems   ?Review of Systems  ?Constitutional:  Negative for chills and fever.  ?HENT:  Negative for sore throat.   ?Eyes:  Positive for photophobia. Negative for visual disturbance.  ?Respiratory:  Negative for shortness of breath.   ?Cardiovascular:  Negative for chest pain.  ?Gastrointestinal:  Positive for nausea and vomiting.  ?Neurological:  Positive for headaches.  ?All other systems reviewed and are negative. ? ?Physical Exam ?Updated Vital Signs ?BP (!) 160/112   Pulse 83   Temp 98.6 ?F (37 ?C) (Oral)   Resp 19   SpO2 100%  ?Physical Exam ?Vitals and nursing note reviewed.  ?Constitutional:   ?   Appearance: She is well-developed. She is not ill-appearing.  ?HENT:  ?   Head: Normocephalic and atraumatic.  ?Cardiovascular:  ?   Rate and Rhythm: Normal rate.  ?Pulmonary:  ?   Effort: Pulmonary effort is normal.  ?  Abdominal:  ?   Palpations: Abdomen is soft.  ?Musculoskeletal:  ?   Cervical back: Normal range of motion and neck supple.  ?Skin: ?   General: Skin is warm and dry.  ?Neurological:  ?   Mental Status: She is alert and oriented to person, place, and time.  ?   GCS: GCS eye subscore is 4. GCS verbal subscore is 5. GCS motor subscore is 6.  ?   Cranial Nerves: No dysarthria or facial asymmetry.  ?   Motor: Weakness present.  ?   Comments: Alert, oriented, thought content appropriate. Speech fluent without evidence of aphasia. Able to follow 2 step commands without difficulty.  ?Cranial Nerves:  ?II:  Peripheral visual fields  grossly normal, pupils, round, reactive to light ?III,IV, VI: ptosis not present, extra-ocular motions intact bilaterally  ?V,VII: smile symmetric, facial light touch sensation equal ?VIII: hearing grossly normal bilaterally  ?IX,X: midline uvula rise  ?XI: bilateral shoulder shrug equal and strong ?XII: midline tongue extension  ?Motor:  ?5/5 in LEFT upper and lower extremities bilaterally including strong and equal grip strength and dorsiflexion/plantar flexion ?3/5 IN RIGHT upper and lower extremity bilaterally including weak grip strength in dorsiflexion and plantar flexion (at baseline per pt) ?Sensory: light touch normal in all extremities.  ?Cerebellar: normal finger-to-nose with bilateral upper extremities, pronator drift negative ?Gait: normal gait and balance ? ?  ? ? ?ED Results / Procedures / Treatments   ?Labs ?(all labs ordered are listed, but only abnormal results are displayed) ?Labs Reviewed  ?CBC WITH DIFFERENTIAL/PLATELET - Abnormal; Notable for the following components:  ?    Result Value  ? WBC 10.7 (*)   ? MCV 78.5 (*)   ? MCH 25.9 (*)   ? RDW 15.9 (*)   ? All other components within normal limits  ?COMPREHENSIVE METABOLIC PANEL - Abnormal; Notable for the following components:  ? Glucose, Bld 107 (*)   ? Creatinine, Ser 1.27 (*)   ? Calcium 8.6 (*)   ? Total Protein 4.9 (*)   ? Albumin 1.6 (*)   ? GFR, Estimated 53 (*)   ? All other components within normal limits  ? ? ?EKG ?None ? ?Radiology ?CT HEAD WO CONTRAST (5MM) ? ?Result Date: 12/08/2021 ?CLINICAL DATA:  Headache, chronic, no new features EXAM: CT HEAD WITHOUT CONTRAST TECHNIQUE: Contiguous axial images were obtained from the base of the skull through the vertex without intravenous contrast. RADIATION DOSE REDUCTION: This exam was performed according to the departmental dose-optimization program which includes automated exposure control, adjustment of the mA and/or kV according to patient size and/or use of iterative reconstruction  technique. COMPARISON:  05/23/2021 FINDINGS: Brain: No evidence of acute infarction, hemorrhage, hydrocephalus, extra-axial collection or mass lesion/mass effect. Chronic bilateral basal ganglia lacunar infarcts. Mild scattered low-density changes within the periventricular and subcortical white matter compatible with chronic microvascular ischemic change. Vascular: No hyperdense vessel or unexpected calcification. Skull: Normal. Negative for fracture or focal lesion. Sinuses/Orbits: No acute finding. Other: None. IMPRESSION: 1. No acute intracranial abnormality. 2. Mild chronic microvascular ischemic change. Electronically Signed   By: Davina Poke D.O.   On: 12/08/2021 13:46   ? ?Procedures ?Procedures  ? ? ?Medications Ordered in ED ?Medications  ?prochlorperazine (COMPAZINE) injection 10 mg (10 mg Intravenous Given 12/08/21 1324)  ?diphenhydrAMINE (BENADRYL) injection 25 mg (25 mg Intravenous Given 12/08/21 1323)  ?ketorolac (TORADOL) 15 MG/ML injection 15 mg (15 mg Intravenous Given 12/08/21 1422)  ?sodium chloride 0.9 % bolus 1,000  mL (1,000 mLs Intravenous New Bag/Given 12/08/21 1422)  ? ? ?ED Course/ Medical Decision Making/ A&P ?  ?                        ?Medical Decision Making ?Amount and/or Complexity of Data Reviewed ?Labs: ordered. ?Radiology: ordered. ? ?Risk ?Prescription drug management. ? ? ?This patient presents to the ED for concern of headache, this involves a number of treatment options, and is a complaint that carries with it a high risk of complications and morbidity.  The differential diagnosis includes Press, migraine, hemorrhage versus drug. ? ? ?Co morbidities: ?Discussed in HPI ? ? ?Brief History: ? ?Patient with prior history of CVA presents to the ED with headache that began yesterday, describing as a sharp sensation to the frontal aspect of her head.  Prior history of migraines however has not had 1 in several years per patient.  Last CVA noted in the September last year.  With some  known right upper arm, lower leg weakness.  Also endorsing some nausea along with phobia and phonophobia.  Arrived hypertensive with a systolic in the 191Y, did take her blood pressure medication this morning. ? ?EMR r

## 2021-12-08 NOTE — ED Triage Notes (Addendum)
Patient BIB [facility] with recurrent headache that started yesterday, resolved after taking some medication, and then returned at 0930 this morning with worse pain. History of stroke with slight right sided residual weakness. ? ?BP 144/96 ?HR 88 ?100% on room air ?CBG 116 ?

## 2021-12-08 NOTE — ED Notes (Signed)
Patient transported to CT 

## 2021-12-29 ENCOUNTER — Inpatient Hospital Stay (HOSPITAL_COMMUNITY): Payer: BC Managed Care – PPO

## 2021-12-29 ENCOUNTER — Inpatient Hospital Stay (HOSPITAL_COMMUNITY)
Admission: EM | Admit: 2021-12-29 | Discharge: 2022-01-02 | DRG: 917 | Disposition: A | Discharge status: Rehabilitation Facility | Payer: BC Managed Care – PPO | Attending: Neurology | Admitting: Neurology

## 2021-12-29 ENCOUNTER — Emergency Department (HOSPITAL_COMMUNITY): Payer: BC Managed Care – PPO

## 2021-12-29 DIAGNOSIS — I10 Essential (primary) hypertension: Secondary | ICD-10-CM | POA: Diagnosis not present

## 2021-12-29 DIAGNOSIS — R29810 Facial weakness: Secondary | ICD-10-CM | POA: Diagnosis present

## 2021-12-29 DIAGNOSIS — N390 Urinary tract infection, site not specified: Secondary | ICD-10-CM | POA: Diagnosis present

## 2021-12-29 DIAGNOSIS — F32A Depression, unspecified: Secondary | ICD-10-CM | POA: Diagnosis present

## 2021-12-29 DIAGNOSIS — E1122 Type 2 diabetes mellitus with diabetic chronic kidney disease: Secondary | ICD-10-CM | POA: Diagnosis present

## 2021-12-29 DIAGNOSIS — F14929 Cocaine use, unspecified with intoxication, unspecified: Secondary | ICD-10-CM | POA: Diagnosis present

## 2021-12-29 DIAGNOSIS — N179 Acute kidney failure, unspecified: Secondary | ICD-10-CM | POA: Diagnosis not present

## 2021-12-29 DIAGNOSIS — R471 Dysarthria and anarthria: Secondary | ICD-10-CM | POA: Diagnosis present

## 2021-12-29 DIAGNOSIS — G8194 Hemiplegia, unspecified affecting left nondominant side: Secondary | ICD-10-CM | POA: Diagnosis present

## 2021-12-29 DIAGNOSIS — I6381 Other cerebral infarction due to occlusion or stenosis of small artery: Secondary | ICD-10-CM | POA: Diagnosis present

## 2021-12-29 DIAGNOSIS — I6300 Cerebral infarction due to thrombosis of unspecified precerebral artery: Secondary | ICD-10-CM | POA: Diagnosis not present

## 2021-12-29 DIAGNOSIS — E119 Type 2 diabetes mellitus without complications: Secondary | ICD-10-CM | POA: Diagnosis not present

## 2021-12-29 DIAGNOSIS — Z833 Family history of diabetes mellitus: Secondary | ICD-10-CM

## 2021-12-29 DIAGNOSIS — Z8673 Personal history of transient ischemic attack (TIA), and cerebral infarction without residual deficits: Secondary | ICD-10-CM

## 2021-12-29 DIAGNOSIS — Z7902 Long term (current) use of antithrombotics/antiplatelets: Secondary | ICD-10-CM

## 2021-12-29 DIAGNOSIS — F419 Anxiety disorder, unspecified: Secondary | ICD-10-CM | POA: Diagnosis present

## 2021-12-29 DIAGNOSIS — T405X1A Poisoning by cocaine, accidental (unintentional), initial encounter: Principal | ICD-10-CM | POA: Diagnosis present

## 2021-12-29 DIAGNOSIS — F17210 Nicotine dependence, cigarettes, uncomplicated: Secondary | ICD-10-CM | POA: Diagnosis present

## 2021-12-29 DIAGNOSIS — I69351 Hemiplegia and hemiparesis following cerebral infarction affecting right dominant side: Secondary | ICD-10-CM | POA: Diagnosis not present

## 2021-12-29 DIAGNOSIS — Z794 Long term (current) use of insulin: Secondary | ICD-10-CM | POA: Diagnosis not present

## 2021-12-29 DIAGNOSIS — R531 Weakness: Principal | ICD-10-CM

## 2021-12-29 DIAGNOSIS — I129 Hypertensive chronic kidney disease with stage 1 through stage 4 chronic kidney disease, or unspecified chronic kidney disease: Secondary | ICD-10-CM | POA: Diagnosis present

## 2021-12-29 DIAGNOSIS — E785 Hyperlipidemia, unspecified: Secondary | ICD-10-CM | POA: Diagnosis present

## 2021-12-29 DIAGNOSIS — G8191 Hemiplegia, unspecified affecting right dominant side: Secondary | ICD-10-CM | POA: Diagnosis present

## 2021-12-29 DIAGNOSIS — R030 Elevated blood-pressure reading, without diagnosis of hypertension: Secondary | ICD-10-CM

## 2021-12-29 DIAGNOSIS — K219 Gastro-esophageal reflux disease without esophagitis: Secondary | ICD-10-CM | POA: Diagnosis present

## 2021-12-29 DIAGNOSIS — E041 Nontoxic single thyroid nodule: Secondary | ICD-10-CM | POA: Diagnosis present

## 2021-12-29 DIAGNOSIS — N189 Chronic kidney disease, unspecified: Secondary | ICD-10-CM | POA: Diagnosis not present

## 2021-12-29 DIAGNOSIS — I639 Cerebral infarction, unspecified: Secondary | ICD-10-CM

## 2021-12-29 DIAGNOSIS — I6330 Cerebral infarction due to thrombosis of unspecified cerebral artery: Secondary | ICD-10-CM | POA: Diagnosis not present

## 2021-12-29 DIAGNOSIS — Z8249 Family history of ischemic heart disease and other diseases of the circulatory system: Secondary | ICD-10-CM

## 2021-12-29 DIAGNOSIS — G43909 Migraine, unspecified, not intractable, without status migrainosus: Secondary | ICD-10-CM | POA: Diagnosis present

## 2021-12-29 DIAGNOSIS — Z79899 Other long term (current) drug therapy: Secondary | ICD-10-CM

## 2021-12-29 DIAGNOSIS — G250 Essential tremor: Secondary | ICD-10-CM | POA: Diagnosis present

## 2021-12-29 DIAGNOSIS — E876 Hypokalemia: Secondary | ICD-10-CM | POA: Diagnosis present

## 2021-12-29 DIAGNOSIS — I63411 Cerebral infarction due to embolism of right middle cerebral artery: Secondary | ICD-10-CM | POA: Diagnosis not present

## 2021-12-29 DIAGNOSIS — N183 Chronic kidney disease, stage 3 unspecified: Secondary | ICD-10-CM | POA: Diagnosis present

## 2021-12-29 DIAGNOSIS — I6389 Other cerebral infarction: Secondary | ICD-10-CM | POA: Diagnosis not present

## 2021-12-29 HISTORY — DX: Cerebral infarction, unspecified: I63.9

## 2021-12-29 HISTORY — DX: Depression, unspecified: F32.A

## 2021-12-29 HISTORY — DX: Suicidal ideations: R45.851

## 2021-12-29 HISTORY — DX: Migraine, unspecified, not intractable, without status migrainosus: G43.909

## 2021-12-29 HISTORY — DX: Cocaine abuse, uncomplicated: F14.10

## 2021-12-29 HISTORY — DX: Nontoxic single thyroid nodule: E04.1

## 2021-12-29 LAB — I-STAT CHEM 8, ED
BUN: 14 mg/dL (ref 6–20)
Calcium, Ion: 1.16 mmol/L (ref 1.15–1.40)
Chloride: 110 mmol/L (ref 98–111)
Creatinine, Ser: 1.3 mg/dL — ABNORMAL HIGH (ref 0.44–1.00)
Glucose, Bld: 111 mg/dL — ABNORMAL HIGH (ref 70–99)
HCT: 28 % — ABNORMAL LOW (ref 36.0–46.0)
Hemoglobin: 9.5 g/dL — ABNORMAL LOW (ref 12.0–15.0)
Potassium: 3.1 mmol/L — ABNORMAL LOW (ref 3.5–5.1)
Sodium: 143 mmol/L (ref 135–145)
TCO2: 25 mmol/L (ref 22–32)

## 2021-12-29 LAB — DIFFERENTIAL
Abs Immature Granulocytes: 0.18 10*3/uL — ABNORMAL HIGH (ref 0.00–0.07)
Basophils Absolute: 0.1 10*3/uL (ref 0.0–0.1)
Basophils Relative: 0 %
Eosinophils Absolute: 0.4 10*3/uL (ref 0.0–0.5)
Eosinophils Relative: 4 %
Immature Granulocytes: 2 %
Lymphocytes Relative: 32 %
Lymphs Abs: 3.6 10*3/uL (ref 0.7–4.0)
Monocytes Absolute: 1.1 10*3/uL — ABNORMAL HIGH (ref 0.1–1.0)
Monocytes Relative: 10 %
Neutro Abs: 5.8 10*3/uL (ref 1.7–7.7)
Neutrophils Relative %: 52 %

## 2021-12-29 LAB — CBC
HCT: 30.7 % — ABNORMAL LOW (ref 36.0–46.0)
Hemoglobin: 10.3 g/dL — ABNORMAL LOW (ref 12.0–15.0)
MCH: 26.2 pg (ref 26.0–34.0)
MCHC: 33.6 g/dL (ref 30.0–36.0)
MCV: 78.1 fL — ABNORMAL LOW (ref 80.0–100.0)
Platelets: 222 10*3/uL (ref 150–400)
RBC: 3.93 MIL/uL (ref 3.87–5.11)
RDW: 15.2 % (ref 11.5–15.5)
WBC: 11.1 10*3/uL — ABNORMAL HIGH (ref 4.0–10.5)
nRBC: 0 % (ref 0.0–0.2)

## 2021-12-29 LAB — CBG MONITORING, ED: Glucose-Capillary: 121 mg/dL — ABNORMAL HIGH (ref 70–99)

## 2021-12-29 LAB — APTT: aPTT: 34 seconds (ref 24–36)

## 2021-12-29 LAB — MRSA NEXT GEN BY PCR, NASAL: MRSA by PCR Next Gen: NOT DETECTED

## 2021-12-29 LAB — COMPREHENSIVE METABOLIC PANEL
ALT: 7 U/L (ref 0–44)
AST: 13 U/L — ABNORMAL LOW (ref 15–41)
Albumin: <1.5 g/dL — ABNORMAL LOW (ref 3.5–5.0)
Alkaline Phosphatase: 54 U/L (ref 38–126)
Anion gap: 5 (ref 5–15)
BUN: 15 mg/dL (ref 6–20)
CO2: 23 mmol/L (ref 22–32)
Calcium: 7.8 mg/dL — ABNORMAL LOW (ref 8.9–10.3)
Chloride: 114 mmol/L — ABNORMAL HIGH (ref 98–111)
Creatinine, Ser: 1.33 mg/dL — ABNORMAL HIGH (ref 0.44–1.00)
GFR, Estimated: 50 mL/min — ABNORMAL LOW (ref >60)
Glucose, Bld: 117 mg/dL — ABNORMAL HIGH (ref 70–99)
Potassium: 3.2 mmol/L — ABNORMAL LOW (ref 3.5–5.1)
Sodium: 142 mmol/L (ref 135–145)
Total Bilirubin: 0.6 mg/dL (ref 0.3–1.2)
Total Protein: 4.2 g/dL — ABNORMAL LOW (ref 6.5–8.1)

## 2021-12-29 LAB — PROTIME-INR
INR: 1 (ref 0.8–1.2)
Prothrombin Time: 12.6 seconds (ref 11.4–15.2)

## 2021-12-29 LAB — I-STAT BETA HCG BLOOD, ED (MC, WL, AP ONLY): I-stat hCG, quantitative: 5.6 m[IU]/mL — ABNORMAL HIGH (ref <5)

## 2021-12-29 LAB — HEMOGLOBIN A1C
Hgb A1c MFr Bld: 7.1 % — ABNORMAL HIGH (ref 4.8–5.6)
Mean Plasma Glucose: 157.07 mg/dL

## 2021-12-29 MED ORDER — LABETALOL HCL 5 MG/ML IV SOLN: 20.0000 mg | Freq: Once | INTRAVENOUS | Status: DC

## 2021-12-29 MED ORDER — POTASSIUM CHLORIDE 10 MEQ/100ML IV SOLN
10.0000 meq | INTRAVENOUS | Status: AC
  Administered 2021-12-29 (×5): 10 meq via INTRAVENOUS
  Filled 2021-12-29 (×5): qty 100

## 2021-12-29 MED ORDER — SENNOSIDES-DOCUSATE SODIUM 8.6-50 MG PO TABS
1.0000 | ORAL_TABLET | Freq: Every evening | ORAL | Status: DC | PRN
  Administered 2021-12-30 – 2021-12-31 (×2): 1 via ORAL
  Filled 2021-12-29 (×2): qty 1

## 2021-12-29 MED ORDER — ACETAMINOPHEN 325 MG PO TABS
650.0000 mg | ORAL_TABLET | ORAL | Status: DC | PRN
  Administered 2021-12-29: 650 mg via ORAL
  Filled 2021-12-29: qty 2

## 2021-12-29 MED ORDER — HYDRALAZINE HCL 20 MG/ML IJ SOLN
INTRAMUSCULAR | Status: AC
  Filled 2021-12-29: qty 1

## 2021-12-29 MED ORDER — SODIUM CHLORIDE 0.9% FLUSH
3.0000 mL | Freq: Once | INTRAVENOUS | Status: AC
  Administered 2021-12-29: 3 mL via INTRAVENOUS

## 2021-12-29 MED ORDER — STROKE: EARLY STAGES OF RECOVERY BOOK
Freq: Once | Status: AC
  Filled 2021-12-29 (×2): qty 1

## 2021-12-29 MED ORDER — CLEVIDIPINE BUTYRATE 0.5 MG/ML IV EMUL
INTRAVENOUS | Status: AC
  Administered 2021-12-29: 2 mg/h via INTRAVENOUS
  Filled 2021-12-29: qty 50

## 2021-12-29 MED ORDER — ACETAMINOPHEN 650 MG RE SUPP: 650.0000 mg | RECTAL | Status: DC | PRN

## 2021-12-29 MED ORDER — TENECTEPLASE FOR STROKE
0.2500 mg/kg | PACK | Freq: Once | INTRAVENOUS | Status: AC
  Administered 2021-12-29: 18 mg via INTRAVENOUS
  Filled 2021-12-29: qty 10

## 2021-12-29 MED ORDER — ACETAMINOPHEN 160 MG/5ML PO SOLN: 650.0000 mg | ORAL | Status: DC | PRN

## 2021-12-29 MED ORDER — CLEVIDIPINE BUTYRATE 0.5 MG/ML IV EMUL: 0.0000 mg/h | INTRAVENOUS | Status: DC

## 2021-12-29 MED ORDER — PANTOPRAZOLE SODIUM 40 MG IV SOLR
40.0000 mg | Freq: Every day | INTRAVENOUS | Status: DC
  Administered 2021-12-29 – 2021-12-30 (×2): 40 mg via INTRAVENOUS
  Filled 2021-12-29 (×2): qty 10

## 2021-12-29 MED ORDER — CHLORHEXIDINE GLUCONATE CLOTH 2 % EX PADS
6.0000 | MEDICATED_PAD | Freq: Every day | CUTANEOUS | Status: DC
  Administered 2021-12-30: 6 via TOPICAL

## 2021-12-29 MED ORDER — LABETALOL HCL 5 MG/ML IV SOLN
INTRAVENOUS | Status: DC | PRN
Start: 2021-12-29 — End: 2021-12-31
  Administered 2021-12-29 (×2): 10 mg via INTRAVENOUS

## 2021-12-29 MED ORDER — IOHEXOL 350 MG/ML SOLN
100.0000 mL | Freq: Once | INTRAVENOUS | Status: AC | PRN
  Administered 2021-12-29: 100 mL via INTRAVENOUS

## 2021-12-29 MED ORDER — CLEVIDIPINE BUTYRATE 0.5 MG/ML IV EMUL
INTRAVENOUS | Status: DC | PRN
  Administered 2021-12-29: 2 mg/h via INTRAVENOUS

## 2021-12-29 MED ORDER — CLEVIDIPINE BUTYRATE 0.5 MG/ML IV EMUL
0.0000 mg/h | INTRAVENOUS | Status: DC
  Administered 2021-12-29 – 2021-12-30 (×2): 2 mg/h via INTRAVENOUS
  Filled 2021-12-29: qty 50

## 2021-12-29 MED ORDER — SODIUM CHLORIDE 0.9 % IV SOLN: INTRAVENOUS | Status: DC

## 2021-12-29 NOTE — Progress Notes (Addendum)
1600 blood sugar via patient's Libre glucometer sensor 117 ?

## 2021-12-29 NOTE — Plan of Care (Signed)
  Problem: Education: Goal: Knowledge of General Education information will improve Description: Including pain rating scale, medication(s)/side effects and non-pharmacologic comfort measures Outcome: Progressing   Problem: Nutrition: Goal: Adequate nutrition will be maintained Outcome: Progressing   Problem: Coping: Goal: Level of anxiety will decrease Outcome: Progressing   

## 2021-12-29 NOTE — H&P (Signed)
Neurology Consultation ? ?Reason for Consult: code stroke ?Referring Physician:  Dr Ashok Cordia ? ?CC:  dysarthria, left sided weakness ? ?History is obtained from: Patient, chart, warfarin ? ?HPI: Stacey Lucas is a 46 y.o. female past medical history of right corona radiata stroke last year with residual balance deficits requiring walker to walk and cognitive deficits since the stroke, diabetes, hypertension, hyperlipidemia, migraines, Charcot involving the right leg with chronic right leg weakness, presented to the emergency room with last known well according to EMS 1130 when they were called to see the patient for weakness and slurred speech.  Symptoms resolved on the way to the ER.  On initial evaluation by ER RN, she had a slight left facial droop and slurred speech for which a code stroke was activated with a last known well 12 PM since her initial symptoms had completely resolved. ?Boyfriend reports that she woke up with confusion and slurred speech symptoms and that is why they called EMS.  EMS reported resolution of all symptoms on route to the ER. ?Recurrence or new symptoms noted in the ER with a last known well of 12 PM when the EMS were certain that she was completely asymptomatic but the RN now noticed left-sided facial droop and slurred speech. ?Code stroke activated due to the above. ?On my initial examination, nearly complete resolution of the symptoms with only positive findings of-asymmetric lower extremity weakness right weaker than left and mild dysarthria. ? ?Due to fluctuating symptoms-we decided to take her for a stat MRI.  As we were wheeling her to the MRI, with 12:40 PM now as her last known well, on repeat exam at 12:50 PM before putting her on the MRI table, she developed a sudden onset of slurred speech, obvious right facial weakness, right arm near flaccidity and right leg weakness.  This was a drastic change. ? ?We decided not to pursue the MRI, brought her back to the room to obtained  vitals (portable monitor would not work)  which showed extremely high blood pressures-systolic 320E, required 2 doses of labetalol to bring the blood pressure to goal and once blood pressure was at goal, gave her IV TNK. ? ? ?Also of note: ?She requires a lot of help at home-uses a walker, does not cook for herself and needs family to help with multiple ADLs. I see in the chart that she has no-showed to a few for appointments. She also has had cognitive difficulties according to the boyfriend, which I suspect are contributing to these missed appointments. ? ? ? ? ?LKW: 12 PM ?tpa given?:  Yes ?Premorbid modified Rankin scale (mRS): 3-4 ? ? ?ROS: Full ROS was performed and is negative except as noted in the HPI.  ? ?Past Medical History:  ?Diagnosis Date  ? Anxiety and depression   ? Diabetes mellitus without complication ([place])   ? Type II  ? GERD (gastroesophageal reflux disease)   ? Hyperlipidemia   ? Hypertension   ? Impingement syndrome of right shoulder   ? Migraine   ? Osteoarthritis of right knee 05/24/2021  ? Pyelonephritis   ? Sciatica   ? ? ? ?  ? ?Family History  ?Problem Relation Age of Onset  ? Diabetes Mother   ? Hypertension Mother   ? Arthritis Mother   ? Diabetes Father   ? Hypertension Father   ? ? ? ?Social History:  ? reports that she has been smoking cigarettes. She has been smoking an average of 1 pack per day.  She has never used smokeless tobacco. She reports current alcohol use. She reports that she does not use drugs. ? ?Medications ? ?Current Facility-Administered Medications:  ?  sodium chloride flush (NS) 0.9 % injection 3 mL, 3 mL, Intravenous, Once, Lajean Saver, MD ? ?Current Outpatient Medications:  ?  azelastine (ASTELIN) 0.1 % nasal spray, Place 1-2 sprays into both nostrils 2 (two) times daily as needed for allergies., Disp: , Rfl:  ?  blood glucose meter kit and supplies KIT, Check fasting blood sugar in the morning., Disp: 1 each, Rfl: 0 ?  clopidogrel (PLAVIX) 75 MG tablet, Take  1 tablet (75 mg total) by mouth daily. (Patient not taking: Reported on 12/08/2021), Disp: 30 tablet, Rfl: 1 ?  diclofenac Sodium (VOLTAREN) 1 % GEL, Apply 4 g topically daily as needed (For knee pain)., Disp: , Rfl:  ?  Insulin Pen Needle (PEN NEEDLES) 32G X 4 MM MISC, 1 each by Does not apply route at bedtime., Disp: 100 each, Rfl: 3 ?  metoprolol succinate (TOPROL-XL) 25 MG 24 hr tablet, Take 25 mg by mouth daily., Disp: , Rfl:  ?  rosuvastatin (CRESTOR) 10 MG tablet, Take 10 mg by mouth at bedtime., Disp: , Rfl:  ?  valsartan (DIOVAN) 160 MG tablet, Take 160 mg by mouth daily., Disp: , Rfl:  ? ? ?Exam: ?Current vital signs: ?BP (!) 160/85   Pulse 77   Temp 98.2 ?F (36.8 ?C) (Oral)   Resp 11   SpO2 100%  ?Vital signs in last 24 hours: ?Temp:  [98.2 ?F (36.8 ?C)] 98.2 ?F (36.8 ?C) (04/22 1216) ?Pulse Rate:  [77] 77 (04/22 1215) ?Resp:  [11] 11 (04/22 1215) ?BP: (160)/(85) 160/85 (04/22 1215) ?SpO2:  [98 %-100 %] 100 % (04/22 1215) ? ?GENERAL: Awake, alert in NAD ?HEENT: - Normocephalic and atraumatic, dry mm, no LN++, no Thyromegally ?LUNGS - Clear to auscultation bilaterally with no wheezes ?CV - S1S2 RRR, no m/r/g, equal pulses bilaterally. ?ABDOMEN - Soft, nontender, nondistended with normoactive BS ?Ext: warm, well perfused, intact peripheral pulses, no edema ?Initial neurological exam ?Awake alert oriented x3 ?Mild dysarthria that very quickly resolved ?No aphasia ?Cranial nerves II to XII intact ?Motor examination with no weakness in bilateral upper extremity.  Slightly asymmetric weakness in bilateral lower extremities with right side being slightly more weaker than the left with drift in both legs. ?Sensation intact ?Coordination with no dysmetria ?Gait testing deferred ?NIH stroke scale-3 ? ?As she was being taken to the MRI scanner change in neurological exam ?Awake alert oriented ?Moderate to severe dysarthria ?No aphasia ?Cranial nerve examination with right lower facial weakness and flattening of  the right nasolabial fold. ?Motor examination with near flaccid right upper extremity.  Right upper and lower extremity that was antigravity before is no longer antigravity.  Left lower extremity has drift.  Left upper extremity is without weakness. ?Sensation intact ?Coordination not tested ?NIH stroke scale- ?1a Level of Conscious.: 0 ?1b LOC Questions: 0 ?1c LOC Commands: 0 ?2 Best Gaze: 0 ?3 Visual: 0 ?4 Facial Palsy: 2 ?5a Motor Arm - left: 3 ?5b Motor Arm - Right: 0 ?6a Motor Leg - Left: 1 ?6b Motor Leg - Right: 2 ?7 Limb Ataxia: 0 ?8 Sensory: 0 ?9 Best Language: 0 ?10 Dysarthria: 2 ?11 Extinct. and Inatten.: 0 ?TOTAL: 10 ? ?Labs ?I have reviewed labs in epic and the results pertinent to this consultation are: ? ?CBC ?   ?Component Value Date/Time  ? WBC 10.7 (H) 12/08/2021  1238  ? RBC 5.06 12/08/2021 1238  ? HGB 13.1 12/08/2021 1238  ? HCT 39.7 12/08/2021 1238  ? PLT 301 12/08/2021 1238  ? MCV 78.5 (L) 12/08/2021 1238  ? MCH 25.9 (L) 12/08/2021 1238  ? MCHC 33.0 12/08/2021 1238  ? RDW 15.9 (H) 12/08/2021 1238  ? LYMPHSABS 3.7 12/08/2021 1238  ? MONOABS 0.9 12/08/2021 1238  ? EOSABS 0.4 12/08/2021 1238  ? BASOSABS 0.1 12/08/2021 1238  ? ? ?CMP  ?   ?Component Value Date/Time  ? NA 140 12/08/2021 1238  ? NA 127 (L) 11/27/2017 1708  ? K 3.7 12/08/2021 1238  ? CL 110 12/08/2021 1238  ? CO2 24 12/08/2021 1238  ? GLUCOSE 107 (H) 12/08/2021 1238  ? BUN 16 12/08/2021 1238  ? BUN 12 11/27/2017 1708  ? CREATININE 1.27 (H) 12/08/2021 1238  ? CALCIUM 8.6 (L) 12/08/2021 1238  ? PROT 4.9 (L) 12/08/2021 1238  ? PROT 6.6 11/27/2017 1708  ? ALBUMIN 1.6 (L) 12/08/2021 1238  ? ALBUMIN 4.2 11/27/2017 1708  ? AST 19 12/08/2021 1238  ? ALT 7 12/08/2021 1238  ? ALKPHOS 55 12/08/2021 1238  ? BILITOT 0.6 12/08/2021 1238  ? BILITOT <0.2 11/27/2017 1708  ? GFRNONAA 53 (L) 12/08/2021 1238  ? GFRAA >60 04/14/2020 1335  ? ? ?Lipid Panel  ?   ?Component Value Date/Time  ? CHOL 341 (H) 05/23/2021 1220  ? TRIG 154 (H) 05/23/2021 1220  ? HDL  65 05/23/2021 1220  ? CHOLHDL 5.2 05/23/2021 1220  ? VLDL 31 05/23/2021 1220  ? LDLCALC 245 (H) 05/23/2021 1220  ? ? ? ?Imaging ?I have reviewed the images obtained: ? ?CT-head: Aspects 10 ?CTA head and neck

## 2021-12-29 NOTE — ED Provider Notes (Signed)
?[facility] ?Provider Note ? ? ?CSN: [id number] ?Arrival date & time: 12/29/21  1204 ? ?  ? ?History ? ?Chief Complaint  ?Patient presents with  ? Code Stroke  ? ? ?Stacey Lucas is a 46 y.o. female. ? ?Patient c/o right sided weakness, and trouble speaking. Symptoms acute onset this AM, then resolved, and then recurrent just prior to ED presentation. Pt denies headache. No neck pain or stiffness. Denies eye pain or change in vision. ?facial weakness and dysarthria. Code stroke activated on patient arrival.  ? ?The history is provided by the patient, the nursing home, medical records and a relative. The history is limited by the condition of the patient.  ? ?  ? ?Home Medications ?Prior to Admission medications   ?Medication Sig Start Date End Date Taking? Authorizing Provider  ?azelastine (ASTELIN) 0.1 % nasal spray Place 1-2 sprays into both nostrils 2 (two) times daily as needed for allergies. 09/11/21   [provider]  ?blood glucose meter kit and supplies KIT Check fasting blood sugar in the morning. 11/27/17   Jaynee Eagles, PA-C  ?clopidogrel (PLAVIX) 75 MG tablet Take 1 tablet (75 mg total) by mouth daily. ?Patient not taking: Reported on 12/08/2021 05/26/21   Shawna Clamp, MD  ?diclofenac Sodium (VOLTAREN) 1 % GEL Apply 4 g topically daily as needed (For knee pain). 04/14/21   [provider]  ?Insulin Pen Needle (PEN NEEDLES) 32G X 4 MM MISC 1 each by Does not apply route at bedtime. 01/30/18   Jaynee Eagles, PA-C  ?metoprolol succinate (TOPROL-XL) 25 MG 24 hr tablet Take 25 mg by mouth daily. 12/07/21   [provider]  ?rosuvastatin (CRESTOR) 10 MG tablet Take 10 mg by mouth at bedtime. 03/07/21   [provider]  ?valsartan (DIOVAN) 160 MG tablet Take 160 mg by mouth daily. 12/07/21   [provider]  ?   ? ?Allergies    ?Patient has no known allergies.   ? ?Review of Systems   ?Review of Systems  ?Constitutional:  Negative for fever.   ?HENT:  Negative for sore throat.   ?Eyes:  Negative for visual disturbance.  ?Respiratory:  Negative for shortness of breath.   ?Cardiovascular:  Negative for chest pain.  ?Gastrointestinal:  Negative for abdominal pain and vomiting.  ?Genitourinary:  Negative for flank pain.  ?Musculoskeletal:  Negative for back pain and neck pain.  ?Skin:  Negative for rash.  ?Neurological:  Positive for weakness. Negative for headaches.  ?Psychiatric/Behavioral:  Negative for agitation.   ? ?Physical Exam ?Updated Vital Signs ?BP 137/74   Pulse 88   Temp 98.2 ?F (36.8 ?C) (Oral)   Resp 13   SpO2 94%  ?Physical Exam ?Vitals and nursing note reviewed.  ?Constitutional:   ?   Appearance: Normal appearance. She is well-developed.  ?HENT:  ?   Head: Atraumatic.  ?   Nose: Nose normal.  ?   Mouth/Throat:  ?   Mouth: Mucous membranes are moist.  ?Eyes:  ?   General: No scleral icterus. ?   Extraocular Movements: Extraocular movements intact.  ?   Conjunctiva/sclera: Conjunctivae normal.  ?   Pupils: Pupils are equal, round, and reactive to light.  ?Neck:  ?   Vascular: No carotid bruit.  ?   Trachea: No tracheal deviation.  ?Cardiovascular:  ?   Rate and Rhythm: Normal rate and regular rhythm.  ?   Pulses: Normal pulses.  ?   Heart sounds: Normal heart  sounds. No murmur heard. ?  No friction rub. No gallop.  ?Pulmonary:  ?   Effort: Pulmonary effort is normal. No respiratory distress.  ?   Breath sounds: Normal breath sounds.  ?Abdominal:  ?   General: Bowel sounds are normal. There is no distension.  ?   Palpations: Abdomen is soft.  ?   Tenderness: There is no abdominal tenderness.  ?Genitourinary: ?   Comments: No cva tenderness.  ?Musculoskeletal:     ?   General: No swelling or tenderness.  ?   Cervical back: Normal range of motion and neck supple. No rigidity or tenderness. No muscular tenderness.  ?   Comments: C/T spine non tender, aligned.   ?Skin: ?   General: Skin is warm and dry.  ?   Findings: No rash.  ?Neurological:   ?   Mental Status: She is alert.  ?   Comments: Alert, responds to questions, ?dysarthria. Facial and right sided weakness.   ?Psychiatric:     ?   Mood and Affect: Mood normal.  ? ? ?ED Results / Procedures / Treatments   ?Labs ?(all labs ordered are listed, but only abnormal results are displayed) ?Results for orders placed or performed during the hospital encounter of 12/29/21  ?Protime-INR  ?Result Value Ref Range  ? Prothrombin Time 12.6 11.4 - 15.2 seconds  ? INR 1.0 0.8 - 1.2  ?APTT  ?Result Value Ref Range  ? aPTT 34 24 - 36 seconds  ?CBC  ?Result Value Ref Range  ? WBC 11.1 (H) 4.0 - 10.5 K/uL  ? RBC 3.93 3.87 - 5.11 MIL/uL  ? Hemoglobin 10.3 (L) 12.0 - 15.0 g/dL  ? HCT 30.7 (L) 36.0 - 46.0 %  ? MCV 78.1 (L) 80.0 - 100.0 fL  ? MCH 26.2 26.0 - 34.0 pg  ? MCHC 33.6 30.0 - 36.0 g/dL  ? RDW 15.2 11.5 - 15.5 %  ? Platelets 222 150 - 400 K/uL  ? nRBC 0.0 0.0 - 0.2 %  ?Differential  ?Result Value Ref Range  ? Neutrophils Relative % 52 %  ? Neutro Abs 5.8 1.7 - 7.7 K/uL  ? Lymphocytes Relative 32 %  ? Lymphs Abs 3.6 0.7 - 4.0 K/uL  ? Monocytes Relative 10 %  ? Monocytes Absolute 1.1 (H) 0.1 - 1.0 K/uL  ? Eosinophils Relative 4 %  ? Eosinophils Absolute 0.4 0.0 - 0.5 K/uL  ? Basophils Relative 0 %  ? Basophils Absolute 0.1 0.0 - 0.1 K/uL  ? Immature Granulocytes 2 %  ? Abs Immature Granulocytes 0.18 (H) 0.00 - 0.07 K/uL  ?Comprehensive metabolic panel  ?Result Value Ref Range  ? Sodium 142 135 - 145 mmol/L  ? Potassium 3.2 (L) 3.5 - 5.1 mmol/L  ? Chloride 114 (H) 98 - 111 mmol/L  ? CO2 23 22 - 32 mmol/L  ? Glucose, Bld 117 (H) 70 - 99 mg/dL  ? BUN 15 6 - 20 mg/dL  ? Creatinine, Ser 1.33 (H) 0.44 - 1.00 mg/dL  ? Calcium 7.8 (L) 8.9 - 10.3 mg/dL  ? Total Protein 4.2 (L) 6.5 - 8.1 g/dL  ? Albumin <1.5 (L) 3.5 - 5.0 g/dL  ? AST 13 (L) 15 - 41 U/L  ? ALT 7 0 - 44 U/L  ? Alkaline Phosphatase 54 38 - 126 U/L  ? Total Bilirubin 0.6 0.3 - 1.2 mg/dL  ? GFR, Estimated 50 (L) >60 mL/min  ? Anion gap 5 5 - 15  ?Hemoglobin A1c   ?  Result Value Ref Range  ? Hgb A1c MFr Bld 7.1 (H) 4.8 - 5.6 %  ? Mean Plasma Glucose 157.07 mg/dL  ?CBG monitoring, ED  ?Result Value Ref Range  ? Glucose-Capillary 121 (H) 70 - 99 mg/dL  ?I-stat chem 8, ED  ?Result Value Ref Range  ? Sodium 143 135 - 145 mmol/L  ? Potassium 3.1 (L) 3.5 - 5.1 mmol/L  ? Chloride 110 98 - 111 mmol/L  ? BUN 14 6 - 20 mg/dL  ? Creatinine, Ser 1.30 (H) 0.44 - 1.00 mg/dL  ? Glucose, Bld 111 (H) 70 - 99 mg/dL  ? Calcium, Ion 1.16 1.15 - 1.40 mmol/L  ? TCO2 25 22 - 32 mmol/L  ? Hemoglobin 9.5 (L) 12.0 - 15.0 g/dL  ? HCT 28.0 (L) 36.0 - 46.0 %  ?I-Stat beta hCG blood, ED  ?Result Value Ref Range  ? I-stat hCG, quantitative 5.6 (H) <5 mIU/mL  ? Comment 3          ? ?CT HEAD WO CONTRAST (5MM) ? ?Result Date: 12/08/2021 ?CLINICAL DATA:  Headache, chronic, no new features EXAM: CT HEAD WITHOUT CONTRAST TECHNIQUE: Contiguous axial images were obtained from the base of the skull through the vertex without intravenous contrast. RADIATION DOSE REDUCTION: This exam was performed according to the departmental dose-optimization program which includes automated exposure control, adjustment of the mA and/or kV according to patient size and/or use of iterative reconstruction technique. COMPARISON:  05/23/2021 FINDINGS: Brain: No evidence of acute infarction, hemorrhage, hydrocephalus, extra-axial collection or mass lesion/mass effect. Chronic bilateral basal ganglia lacunar infarcts. Mild scattered low-density changes within the periventricular and subcortical white matter compatible with chronic microvascular ischemic change. Vascular: No hyperdense vessel or unexpected calcification. Skull: Normal. Negative for fracture or focal lesion. Sinuses/Orbits: No acute finding. Other: None. IMPRESSION: 1. No acute intracranial abnormality. 2. Mild chronic microvascular ischemic change. Electronically Signed   By: Davina Poke D.O.   On: 12/08/2021 13:46  ? ?CT HEAD CODE STROKE WO CONTRAST ? ?Result Date:  12/29/2021 ?CLINICAL DATA:  Code stroke.  Left facial droop and slurred speech EXAM: CT HEAD WITHOUT CONTRAST TECHNIQUE: Contiguous axial images were obtained from the base of the skull through the vertex wi

## 2021-12-29 NOTE — Progress Notes (Signed)
CBG at 2130 was 145 ?

## 2021-12-29 NOTE — Code Documentation (Signed)
Stroke Response Nurse Documentation ?Code Documentation ? ?Stacey Lucas is a 46 y.o. female arriving to [facility]  via [facility] on 12/29/21 with past medical hx of previous right corona radiata, diabetes, HTN, HLD, migraines, charcot foot. On  clopidogrel 75 mg daily but the patient reports that she has not been taking the medication.  . Code stroke was activated by ED.  ? ?Patient from home where she was LKW at 1240 and now complaining of initial asymmetric lower extremity weakness and mild dyarthria. While taking the patient for an MRI her symptoms worsened which included slurred speech, right facial weakness, right extremity weakness. MRI was cancelled and patient was taken back to the room. BP initially was in the 200's but after two doses of labatelol, her pressure was within range to administer TnK. TNK administered at 1300 ? ?Stroke team at the bedside on patient arrival. Labs drawn and patient cleared for CT by Dr. Rory Percy. Patient to CT with team. NIHSS 10, see documentation for details and code stroke times. Patient with right facial droop, right arm weakness, right leg weakness, and Expressive aphasia  on exam. The following imaging was completed:  CT Head as well as CTA. Patient is a candidate for IV Thrombolytic and was given TnK at 1300.  ?Care Plan: VS and [place] q15 mins x 2hour, q30 minutes x 6 hours, q1hour x 16 hours..  ? ?Bedside handoff with ED RN .   ? ?Venetia Maxon  ?Rapid Response RN ?  ?

## 2021-12-29 NOTE — Progress Notes (Signed)
PHARMACIST CODE STROKE RESPONSE ? ?Notified to mix TNK at 1252 by Dr. Wilford Corner ?Delivered TNK to RN at 1256 ?TNK given at 1300 ? ?TNK dose = 18 mg IV over 5 seconds ? ?Issues/delays encountered (if applicable): Patient in MRI with non-working monitor at time of decision to give TNK. Had to go back to patient room for BP monitoring prior to giving medication. Once back in pt's room, BP was above appropriate goal for TNK administration requiring labetalol and clevidipine initiation. ? ?Delmar Landau, PharmD, BCPS ?12/29/2021 1:06 PM ?ED Clinical Pharmacist -  [phone] ? ?

## 2021-12-30 ENCOUNTER — Other Ambulatory Visit (HOSPITAL_COMMUNITY): Payer: BC Managed Care – PPO

## 2021-12-30 ENCOUNTER — Inpatient Hospital Stay (HOSPITAL_COMMUNITY): Payer: BC Managed Care – PPO

## 2021-12-30 DIAGNOSIS — I6389 Other cerebral infarction: Secondary | ICD-10-CM

## 2021-12-30 DIAGNOSIS — I639 Cerebral infarction, unspecified: Secondary | ICD-10-CM | POA: Diagnosis not present

## 2021-12-30 LAB — LIPID PANEL
Cholesterol: 202 mg/dL — ABNORMAL HIGH (ref 0–200)
HDL: 54 mg/dL (ref >40)
LDL Cholesterol: 117 mg/dL — ABNORMAL HIGH (ref 0–99)
Total CHOL/HDL Ratio: 3.7 RATIO
Triglycerides: 153 mg/dL — ABNORMAL HIGH (ref <150)
VLDL: 31 mg/dL (ref 0–40)

## 2021-12-30 LAB — ECHOCARDIOGRAM COMPLETE
Area-P 1/2: 3.99 cm2
S' Lateral: 2.7 cm
Weight: 2447.99 oz

## 2021-12-30 LAB — BASIC METABOLIC PANEL
Anion gap: 8 (ref 5–15)
BUN: 13 mg/dL (ref 6–20)
CO2: 21 mmol/L — ABNORMAL LOW (ref 22–32)
Calcium: 8.4 mg/dL — ABNORMAL LOW (ref 8.9–10.3)
Chloride: 112 mmol/L — ABNORMAL HIGH (ref 98–111)
Creatinine, Ser: 1.33 mg/dL — ABNORMAL HIGH (ref 0.44–1.00)
GFR, Estimated: 50 mL/min — ABNORMAL LOW (ref >60)
Glucose, Bld: 135 mg/dL — ABNORMAL HIGH (ref 70–99)
Potassium: 3.3 mmol/L — ABNORMAL LOW (ref 3.5–5.1)
Sodium: 141 mmol/L (ref 135–145)

## 2021-12-30 LAB — GLUCOSE, CAPILLARY
Glucose-Capillary: 165 mg/dL — ABNORMAL HIGH (ref 70–99)
Glucose-Capillary: 120 mg/dL — ABNORMAL HIGH (ref 70–99)

## 2021-12-30 LAB — RAPID URINE DRUG SCREEN, HOSP PERFORMED
Amphetamines: NOT DETECTED
Barbiturates: NOT DETECTED
Benzodiazepines: NOT DETECTED
Cocaine: POSITIVE — AB
Opiates: NOT DETECTED
Tetrahydrocannabinol: NOT DETECTED

## 2021-12-30 MED ORDER — INSULIN ASPART 100 UNIT/ML IJ SOLN
0.0000 [IU] | Freq: Three times a day (TID) | INTRAMUSCULAR | Status: DC
  Administered 2021-12-31: 2 [IU] via SUBCUTANEOUS
  Administered 2021-12-31 – 2022-01-01 (×2): 3 [IU] via SUBCUTANEOUS
  Administered 2022-01-02: 5 [IU] via SUBCUTANEOUS
  Administered 2022-01-02: 2 [IU] via SUBCUTANEOUS

## 2021-12-30 MED ORDER — ASPIRIN EC 81 MG PO TBEC
81.0000 mg | DELAYED_RELEASE_TABLET | Freq: Every day | ORAL | Status: DC
  Administered 2021-12-30 – 2022-01-02 (×4): 81 mg via ORAL
  Filled 2021-12-30 (×4): qty 1

## 2021-12-30 MED ORDER — POTASSIUM CHLORIDE CRYS ER 20 MEQ PO TBCR
30.0000 meq | EXTENDED_RELEASE_TABLET | ORAL | Status: AC
  Administered 2021-12-30 (×2): 30 meq via ORAL
  Filled 2021-12-30 (×2): qty 1

## 2021-12-30 MED ORDER — ROSUVASTATIN CALCIUM 20 MG PO TABS
20.0000 mg | ORAL_TABLET | Freq: Every day | ORAL | Status: DC
  Administered 2021-12-30 – 2022-01-02 (×4): 20 mg via ORAL
  Filled 2021-12-30 (×4): qty 1

## 2021-12-30 MED ORDER — DICLOFENAC SODIUM 1 % EX GEL
4.0000 g | Freq: Every day | CUTANEOUS | Status: DC | PRN
  Filled 2021-12-30: qty 100

## 2021-12-30 MED ORDER — IRBESARTAN 150 MG PO TABS
150.0000 mg | ORAL_TABLET | Freq: Every day | ORAL | Status: DC
  Administered 2021-12-30 – 2022-01-02 (×4): 150 mg via ORAL
  Filled 2021-12-30 (×4): qty 1

## 2021-12-30 MED ORDER — CITALOPRAM HYDROBROMIDE 10 MG PO TABS
10.0000 mg | ORAL_TABLET | Freq: Every day | ORAL | Status: DC
  Administered 2021-12-30 – 2022-01-02 (×4): 10 mg via ORAL
  Filled 2021-12-30 (×4): qty 1

## 2021-12-30 MED ORDER — LORAZEPAM 2 MG/ML IJ SOLN
0.5000 mg | Freq: Once | INTRAMUSCULAR | Status: AC
  Administered 2021-12-30: 0.5 mg via INTRAVENOUS
  Filled 2021-12-30: qty 1

## 2021-12-30 MED ORDER — ALPRAZOLAM 0.5 MG PO TABS
0.5000 mg | ORAL_TABLET | Freq: Three times a day (TID) | ORAL | Status: DC | PRN
  Administered 2021-12-30 (×2): 0.5 mg via ORAL
  Filled 2021-12-30 (×2): qty 1

## 2021-12-30 MED ORDER — CHLORHEXIDINE GLUCONATE CLOTH 2 % EX PADS
6.0000 | MEDICATED_PAD | Freq: Every day | CUTANEOUS | Status: DC
  Administered 2021-12-31 – 2022-01-02 (×3): 6 via TOPICAL

## 2021-12-30 MED ORDER — CLOPIDOGREL BISULFATE 75 MG PO TABS
75.0000 mg | ORAL_TABLET | Freq: Every day | ORAL | Status: DC
  Administered 2021-12-30 – 2022-01-02 (×4): 75 mg via ORAL
  Filled 2021-12-30 (×4): qty 1

## 2021-12-30 MED ORDER — ENOXAPARIN SODIUM 40 MG/0.4ML IJ SOSY
40.0000 mg | PREFILLED_SYRINGE | INTRAMUSCULAR | Status: DC
  Administered 2021-12-30 – 2022-01-02 (×4): 40 mg via SUBCUTANEOUS
  Filled 2021-12-30 (×4): qty 0.4

## 2021-12-30 MED ORDER — LABETALOL HCL 5 MG/ML IV SOLN: 10.0000 mg | INTRAVENOUS | Status: DC | PRN

## 2021-12-30 MED ORDER — HYDRALAZINE HCL 20 MG/ML IJ SOLN
10.0000 mg | INTRAMUSCULAR | Status: DC | PRN
  Administered 2021-12-30 – 2021-12-31 (×2): 10 mg via INTRAVENOUS
  Filled 2021-12-30 (×2): qty 1

## 2021-12-30 MED ORDER — METOPROLOL SUCCINATE ER 25 MG PO TB24
25.0000 mg | ORAL_TABLET | Freq: Every day | ORAL | Status: DC
  Administered 2021-12-30 – 2022-01-02 (×4): 25 mg via ORAL
  Filled 2021-12-30 (×4): qty 1

## 2021-12-30 NOTE — Evaluation (Signed)
Speech Language Pathology Evaluation ?Patient Details ?Name: Stacey Lucas ?MRN: [id number] ?DOB: 12-05-1975 ?Today's Date: 12/30/2021 ?Time: 6945-0388 ?SLP Time Calculation (min) (ACUTE ONLY): 17 min ? ?Problem List:  ?Patient Active Problem List  ? Diagnosis Date Noted  ? Acute ischemic stroke (HCC) 12/29/2021  ? No-show for appointment 07/06/2021  ? Atrial fibrillation (HCC) 07/04/2021  ? H/O: stroke 07/04/2021  ? Acute CVA (cerebrovascular accident) (HCC) 05/25/2021  ? Cerebral thrombosis with cerebral infarction 05/24/2021  ? Osteoarthritis of right knee 05/24/2021  ? Type 2 diabetes mellitus without complication (HCC) 05/23/2021  ? Acute left-sided weakness 05/23/2021  ? Essential hypertension 05/23/2021  ? Hyperlipidemia 05/23/2021  ? Thyroid nodule 05/23/2021  ? MDD (major depressive disorder), recurrent severe, without psychosis (HCC) 04/27/2018  ? MDD (major depressive disorder), severe (HCC) 04/25/2018  ? ?Past Medical History:  ?Past Medical History:  ?Diagnosis Date  ? Anxiety and depression   ? Diabetes mellitus without complication (HCC)   ? Type II  ? GERD (gastroesophageal reflux disease)   ? Hyperlipidemia   ? Hypertension   ? Impingement syndrome of right shoulder   ? Migraine   ? Osteoarthritis of right knee 05/24/2021  ? Pyelonephritis   ? Sciatica   ? ?Past Surgical History:  ?Past Surgical History:  ?Procedure Laterality Date  ? tubal    ? ?HPI:  ?46 y.o. female presented to EMS with weakness and slurred speech, resolving on way to ED. During stat MRI she developed recurring symptoms. MRI was aborted and she received IV TNK. Symptoms have waxed and waned since that time. MRI  4/23: 2.4 x 1.1 x 2.0 cm acute infarct within the left corona radiata/basal gangli; chronic small-vessel infarcts within the bilateral corona radiata/basal ganglia; moderate cerebral white matter chronic small vessel dz. past medical history includes right corona radiata stroke last year with residual balance deficits  requiring walker to walk and cognitive deficits,, diabetes, hypertension, hyperlipidemia, migraines, Charcot involving the right leg with chronic right leg weakness. Per notes, family assists with ADLs and she uses a walker at baseline.  ? ?Assessment / Plan / Recommendation ?Clinical Impression ? Stacey Lucas participated in speech/language assessment with widely fluctuating performance.  Initially she presented with severe dysarthria and near immobility of her tongue, unable to extend beyond bottom teeth and holding saliva orally upon entering the room.  She has CN VII deficts on right side lower face.  She had difficulty following oral motor commands.  After several minutes, clarity of speech and spontaneity of output improved dramatically - she conversed with RN, made needs known. Speech was mildly dysarthric; there were sound repetitions and dysfluencies.  She was able to follow oral commands with no difficulty with execution.  RN describes difficult day with Stacey Lucas having a panic attack after MRI and requiring care and encouragement.  Recommend ongoing SLP f/u for further assessment as she stabilizes and to better determine f/u needs.  D/W RN. ?   ?SLP Assessment ? SLP Recommendation/Assessment: Patient needs continued Speech Lanaguage Pathology Services ?SLP Visit Diagnosis: Cognitive communication deficit (R41.841);Dysarthria and anarthria (R47.1)  ?  ?Recommendations for follow up therapy are one component of a multi-disciplinary discharge planning process, led by the attending physician.  Recommendations may be updated based on patient status, additional functional criteria and insurance authorization. ?   ?Follow Up Recommendations ? Other (comment) (TBA)  ?  ?Assistance Recommended at Discharge ? Frequent or constant Supervision/Assistance  ?Functional Status Assessment Patient has had a recent decline in their functional status  and demonstrates the ability to make significant improvements in function in  a reasonable and predictable amount of time.  ?Frequency and Duration min 2x/week  ?2 weeks ?  ?   ?SLP Evaluation ?Cognition ? Overall Cognitive Status: No family/caregiver present to determine baseline cognitive functioning ?Arousal/Alertness: Awake/alert ?Attention: Sustained ?Sustained Attention: Appears intact  ?  ?   ?Comprehension ? Auditory Comprehension ?Overall Auditory Comprehension: Impaired ?Yes/No Questions: Within Functional Limits ?Commands: Impaired ?One Step Basic Commands: 75-100% accurate ?Two Step Basic Commands: 25-49% accurate ?Visual Recognition/Discrimination ?Discrimination: Not tested  ?  ?Expression Expression ?Primary Mode of Expression: Verbal ?Verbal Expression ?Overall Verbal Expression: Appears within functional limits for tasks assessed ?Initiation: No impairment ?Repetition: No impairment ?Written Expression ?Dominant Hand: Right (no functional use) ?Written Expression: Not tested   ?Oral / Motor ? Oral Motor/Sensory Function ?Overall Oral Motor/Sensory Function: Moderate impairment ?Facial ROM: Reduced right ?Facial Symmetry: Abnormal symmetry right;Suspected CN VII (facial) dysfunction ?Facial Strength: Reduced left;Suspected CN VII (facial) dysfunction ?Lingual Symmetry: Within Functional Limits ?Motor Speech ?Overall Motor Speech: Impaired ?Respiration: Impaired ?Level of Impairment: Sentence ?Phonation: Low vocal intensity ?Articulation: Impaired ?Level of Impairment: Phrase ?Intelligibility: Intelligibility reduced ?Word: 75-100% accurate ?Phrase: 50-74% accurate ?Sentence: 50-74% accurate ?Motor Planning: Impaired ?Level of Impairment: Phrase ?Motor Speech Errors: Groping for words   ?        ? ?Stacey Lucas ?12/30/2021, 2:52 PM ?.acs ?

## 2021-12-30 NOTE — Evaluation (Signed)
Physical Therapy Evaluation ?Patient Details ?Name: Stacey Lucas ?MRN: [id number] ?DOB: Jun 28, 1976 ?Today's Date: 12/30/2021 ? ?History of Present Illness ? 46 y.o. female presents to [facility] hospital with weakness, L facial droop and slurred speech. Once at hospital pt developing R facial weakness. MRI cancelled due to fluctuating neurological symptoms. Pt received tNK. PMH includes R CVA, cognitive deficits, DM, HTN, HLD, charcot involving RLE.  ?Clinical Impression ? Pt presents to PT with deficits in functional mobility, balance, endurance, power, gait, cognition, communication, level of arousal, tone. Pt with R hemiplegia, often falling to right side when sitting at the edge of bed. Pt's balance deficits are inconsistent at times, with notably much less difficulty maintaining upright when sitting on the edge of recliner immediately after transfer. Pt requires significant assistance to stand and perform all functional mobility. Difficult to determine an accurate baseline as the pt is lethargic and presents with significant dysarthria during session. Pt will benefit from continued acute PT services in an effort to reduce falls risk and to restore her prior level of function. PT recommends AIR admission at this time. ?   ? ?Recommendations for follow up therapy are one component of a multi-disciplinary discharge planning process, led by the attending physician.  Recommendations may be updated based on patient status, additional functional criteria and insurance authorization. ? ?Follow Up Recommendations Acute inpatient rehab (3hours/day) ? ?  ?Assistance Recommended at Discharge Frequent or constant Supervision/Assistance  ?Patient can return home with the following ? Two people to help with walking and/or transfers;Two people to help with bathing/dressing/bathroom;Assistance with cooking/housework;Assistance with feeding;Direct supervision/assist for medications management;Direct supervision/assist for financial  management;Assist for transportation;Help with stairs or ramp for entrance ? ?  ?Equipment Recommendations Wheelchair (measurements PT);Hospital bed (hoyer lift)  ?Recommendations for Other Services ? Rehab consult  ?  ?Functional Status Assessment Patient has had a recent decline in their functional status and demonstrates the ability to make significant improvements in function in a reasonable and predictable amount of time.  ? ?  ?Precautions / Restrictions Precautions ?Precautions: Fall ?Precaution Comments: R charcot foot ?Restrictions ?Weight Bearing Restrictions: No  ? ?  ? ?Mobility ? Bed Mobility ?Overal bed mobility: Needs Assistance ?Bed Mobility: Supine to Sit ?  ?  ?Supine to sit: Max assist, HOB elevated ?  ?  ?General bed mobility comments: assist to pivot hips and prevent rightward losses of balance, maxA for RLE movement ?  ? ?Transfers ?Overall transfer level: Needs assistance ?Equipment used: 1 person hand held assist ?Transfers: Sit to/from Stand, Bed to chair/wheelchair/BSC ?Sit to Stand: Max assist ?Stand pivot transfers: Max assist ?  ?  ?  ?  ?General transfer comment: PT provides unilateral knee block and BUE support. Pt with R lateral lean ?  ? ?Ambulation/Gait ?  ?  ?  ?  ?  ?  ?  ?  ? ?Stairs ?  ?  ?  ?  ?  ? ?Wheelchair Mobility ?  ? ?Modified Rankin (Stroke Patients Only) ?Modified Rankin (Stroke Patients Only) ?Pre-Morbid Rankin Score: Moderately severe disability ?Modified Rankin: Severe disability ? ?  ? ?Balance Overall balance assessment: Needs assistance ?Sitting-balance support: Single extremity supported, Feet supported ?Sitting balance-Leahy Scale: Poor ?Sitting balance - Comments: modA at edge of bed, pt requiring minG after transfer to recliner, inconsistencies in balance ?Postural control: Right lateral lean ?Standing balance support: Bilateral upper extremity supported ?Standing balance-Leahy Scale: Poor ?Standing balance comment: maxA, rightward lean ?  ?  ?  ?  ?  ?  ?  ?   ?  ?  ?  ?   ? ? ? ?  Pertinent Vitals/Pain Pain Assessment ?Pain Assessment: Faces ?Faces Pain Scale: Hurts even more ?Pain Location: R foot ?Pain Descriptors / Indicators: Grimacing ?Pain Intervention(s): Monitored during session  ? ? ?Home Living Family/patient expects to be discharged to:: Private residence ?Living Arrangements: Spouse/significant other;Children ?Available Help at Discharge: Family;Available PRN/intermittently ?Type of Home: Apartment ?Home Access: Level entry ?  ?  ?  ?Home Layout: One level ?  ?Additional Comments: pt lethargic during history, unable to provide full reliable history at this time  ?  ?Prior Function Prior Level of Function : Patient poor historian/Family not available ?  ?  ?  ?  ?  ?  ?Mobility Comments: pt reports ambulating with use of a walker ?ADLs Comments: pt unable to report prior level of function with ADLs ?  ? ? ?Hand Dominance  ? Dominant Hand: Right ? ?  ?Extremity/Trunk Assessment  ? Upper Extremity Assessment ?Upper Extremity Assessment: RUE deficits/detail ?RUE Deficits / Details: PT notes flickers of movement during functional mobility, 3-/5 shoulder flexion, 2-/5 elbow flexion/extension, increased elbow flexor tone. PROM WFL ?  ? ?Lower Extremity Assessment ?Lower Extremity Assessment: RLE deficits/detail ?RLE Deficits / Details: pt with 2-/5 strength grossly, formal ROM assessment deferred due to pt reports of significant pain. Chronic deficits from R charcot foot ?  ? ?Cervical / Trunk Assessment ?Cervical / Trunk Assessment: Kyphotic  ?Communication  ? Communication: Expressive difficulties;Other (comment) (dysarthria)  ?Cognition Arousal/Alertness: Lethargic ?Behavior During Therapy: Flat affect ?Overall Cognitive Status: No family/caregiver present to determine baseline cognitive functioning ?  ?  ?  ?  ?  ?  ?  ?  ?  ?  ?  ?  ?  ?  ?  ?  ?General Comments: pt with slowed processing, disoriented to time ?  ?  ? ?  ?General Comments General comments (skin  integrity, edema, etc.): VSS on RA ? ?  ?Exercises    ? ?Assessment/Plan  ?  ?PT Assessment Patient needs continued PT services  ?PT Problem List Decreased strength;Decreased activity tolerance;Decreased balance;Decreased mobility;Decreased coordination;Decreased cognition;Decreased knowledge of use of DME;Decreased knowledge of precautions;Decreased safety awareness;Impaired tone ? ?   ?  ?PT Treatment Interventions DME instruction;Gait training;Functional mobility training;Therapeutic activities;Therapeutic exercise;Balance training;Neuromuscular re-education;Cognitive remediation;Patient/family education;Wheelchair mobility training   ? ?PT Goals (Current goals can be found in the Care Plan section)  ?Acute Rehab PT Goals ?Patient Stated Goal: to improve mobility and balance in an effort to reduce falls risk ?PT Goal Formulation: With patient ?Time For Goal Achievement: 01/13/22 ?Potential to Achieve Goals: Fair ? ?  ?Frequency Min 4X/week ?  ? ? ?Co-evaluation   ?  ?  ?  ?  ? ? ?  ?AM-PAC PT "6 Clicks" Mobility  ?Outcome Measure Help needed turning from your back to your side while in a flat bed without using bedrails?: A Lot ?Help needed moving from lying on your back to sitting on the side of a flat bed without using bedrails?: A Lot ?Help needed moving to and from a bed to a chair (including a wheelchair)?: A Lot ?Help needed standing up from a chair using your arms (e.g., wheelchair or bedside chair)?: A Lot ?Help needed to walk in hospital room?: Total ?Help needed climbing 3-5 steps with a railing? : Total ?6 Click Score: 10 ? ?  ?End of Session   ?Activity Tolerance: Patient tolerated treatment well ?Patient left: in chair;with call bell/phone within reach;with chair alarm set ?Nurse Communication: Mobility status (possible need for lift if too  lethargic) ?PT Visit Diagnosis: Other abnormalities of gait and mobility (R26.89);Muscle weakness (generalized) (M62.81);Hemiplegia and hemiparesis;Other symptoms  and signs involving the nervous system (R29.898) ?Hemiplegia - Right/Left: Right ?Hemiplegia - dominant/non-dominant: Dominant ?Hemiplegia - caused by: Cerebral infarction ?  ? ?Time: 1630-1650 ?PT Time Cal

## 2021-12-30 NOTE — Progress Notes (Signed)
Afternoon blood sugar 150 ?

## 2021-12-30 NOTE — Progress Notes (Signed)
?  Echocardiogram ?2D Echocardiogram has been performed. ? ?Stacey Lucas ?12/30/2021, 10:48 AM ?

## 2021-12-30 NOTE — Progress Notes (Signed)
Inpatient Rehab Admissions Coordinator Note:  ? ?Per PT patient was screened for CIR candidacy by Jannah Guardiola Luvenia Starch, CCC-SLP. At this time, pt appears to be a potential candidate for CIR. I will place an order for rehab consult for full assessment, per our protocol.  Please contact me any with questions.. ? ? ?Stacey Phoenix, MS, CCC-SLP ?Admissions Coordinator ?[phone] ?12/30/21 ?6:04 PM ? ?

## 2021-12-30 NOTE — Progress Notes (Signed)
Glucose sensor RUE removed for MRI ?

## 2021-12-30 NOTE — Progress Notes (Signed)
Morning CBG via Libre sensor 115 ?

## 2021-12-30 NOTE — Progress Notes (Addendum)
STROKE TEAM PROGRESS NOTE  ? ?INTERVAL HISTORY ?Patient seen in room with RN.  RN states that symptoms have improved since she has been in the ICU.  MRI at 1300 confirms a large left basal ganglia infarct.  MRI ended early due to patient anxiety, she was given 0.5 mg of Ativan.  Baseline patient has right lower extremity weakness.  She does also have a history of a right caudate infarct that occurred in September 2022.  She states she is not currently taking aspirin or Plavix.  She is currently taking medication for her blood pressure and cholesterol. ?Blood pressure adequately controlled. ?Vitals:  ? 12/30/21 0630 12/30/21 0645 12/30/21 0700 12/30/21 0730  ?BP: (!) 164/84 (!) 158/78 (!) 145/104 (!) 165/72  ?Pulse: 76 76 74 77  ?Resp: 19 12 14 19   ?Temp:      ?TempSrc:      ?SpO2: 96% 98% 100% 97%  ?Weight:      ? ?CBC:  ?Recent Labs  ?Lab 12/29/21 ?1217 12/29/21 ?1241  ?WBC 11.1*  --   ?NEUTROABS 5.8  --   ?HGB 10.3* 9.5*  ?HCT 30.7* 28.0*  ?MCV 78.1*  --   ?PLT 222  --   ? ?Basic Metabolic Panel:  ?Recent Labs  ?Lab 12/29/21 ?1217 12/29/21 ?1241  ?NA 142 143  ?K 3.2* 3.1*  ?CL 114* 110  ?CO2 23  --   ?GLUCOSE 117* 111*  ?BUN 15 14  ?CREATININE 1.33* 1.30*  ?CALCIUM 7.8*  --   ? ?Lipid Panel:  ?Recent Labs  ?Lab 12/30/21 ?0305  ?CHOL 202*  ?TRIG 153*  ?HDL 54  ?CHOLHDL 3.7  ?VLDL 31  ?LDLCALC 117*  ? ?HgbA1c:  ?Recent Labs  ?Lab 12/29/21 ?1217  ?HGBA1C 7.1*  ? ?Urine Drug Screen: No results for input(s): LABOPIA, COCAINSCRNUR, LABBENZ, AMPHETMU, THCU, LABBARB in the last 168 hours.  ?Alcohol Level No results for input(s): ETH in the last 168 hours. ? ?IMAGING past 24 hours ?DG CHEST PORT 1 VIEW ? ?Result Date: 12/29/2021 ?CLINICAL DATA:  CVA EXAM: PORTABLE CHEST 1 VIEW COMPARISON:  06/18/2018 chest radiograph FINDINGS: The cardiomediastinal silhouette is unremarkable. There is no evidence of focal airspace disease, pulmonary edema, suspicious pulmonary nodule/mass, pleural effusion, or pneumothorax. No acute bony  abnormalities are identified. IMPRESSION: No active disease. Electronically Signed   By: 08/18/2018 M.D.   On: 12/29/2021 16:36  ? ?CT HEAD CODE STROKE WO CONTRAST ? ?Result Date: 12/29/2021 ?CLINICAL DATA:  Code stroke.  Left facial droop and slurred speech EXAM: CT HEAD WITHOUT CONTRAST TECHNIQUE: Contiguous axial images were obtained from the base of the skull through the vertex without intravenous contrast. RADIATION DOSE REDUCTION: This exam was performed according to the departmental dose-optimization program which includes automated exposure control, adjustment of the mA and/or kV according to patient size and/or use of iterative reconstruction technique. COMPARISON:  12/08/2021 FINDINGS: Brain: No evidence of acute infarction, hemorrhage, hydrocephalus, extra-axial collection or mass lesion/mass effect. Chronic small vessel ischemic gliosis in the cerebral white matter. Chronic perforator infarct at the left basal ganglia. Vascular: No hyperdense vessel or unexpected calcification. Skull: Normal. Negative for fracture or focal lesion. Sinuses/Orbits: No acute finding. Other: These results were communicated to Dr. 02/07/2022 at 12:36 pm on 12/29/2021 by text page via the [facility] messaging system. ASPECTS [facility] Stroke Program Early CT Score) - Ganglionic level infarction (caudate, lentiform nuclei, internal capsule, insula, M1-M3 cortex): 7 - Supraganglionic infarction (M4-M6 cortex): 3 Total score (0-10 with 10 being normal): 10 IMPRESSION:  1. No acute finding. 2. Premature chronic small vessel disease. Electronically Signed   By: Tiburcio Pea M.D.   On: 12/29/2021 12:36  ? ?CT ANGIO HEAD NECK W WO CM (CODE STROKE) ? ?Result Date: 12/29/2021 ?CLINICAL DATA:  Provided history: Neuro deficit, acute, stroke suspected. Left-sided weakness. Slurred speech. EXAM: CT ANGIOGRAPHY HEAD AND NECK TECHNIQUE: Multidetector CT imaging of the head and neck was performed using the standard protocol during bolus administration  of intravenous contrast. Multiplanar CT image reconstructions and MIPs were obtained to evaluate the vascular anatomy. Carotid stenosis measurements (when applicable) are obtained utilizing NASCET criteria, using the distal internal carotid diameter as the denominator. RADIATION DOSE REDUCTION: This exam was performed according to the departmental dose-optimization program which includes automated exposure control, adjustment of the mA and/or kV according to patient size and/or use of iterative reconstruction technique. CONTRAST:  OMNIPAQUE IOHEXOL 350 MG/ML SOLN COMPARISON:  Noncontrast head CT performed earlier today 12/29/2021. Brain MRI 05/23/2021. CT angiogram head/neck 05/23/2021. FINDINGS: CTA NECK FINDINGS Aortic arch: The visualized aortic arch is normal in caliber. Streak and beam hardening artifact arising from a dense left-sided contrast bolus partially obscures the innominate artery. Within this limitation, there is no appreciable hemodynamically significant innominate or proximal subclavian artery stenosis. Right carotid system: CCA and ICA patent within the neck without stenosis or significant atherosclerotic disease. Left carotid system: CCA and ICA patent within the neck without stenosis or significant atherosclerotic disease. Vertebral arteries: Vertebral arteries codominant and patent within the neck without stenosis or significant atherosclerotic disease. Skeleton: Cervical spondylosis. No acute bony abnormality or aggressive osseous lesion. Other neck: 1.8 cm right thyroid lobe nodule. Upper chest: No consolidation within the imaged lung apices. Review of the MIP images confirms the above findings CTA HEAD FINDINGS Anterior circulation: The intracranial internal carotid arteries are patent. The M1 middle cerebral arteries are patent. No no M2 proximal branch occlusion is identified. Atherosclerotic irregularity of the M2 and more distal MCA vessels, bilaterally. Most notably, there is an  apparent moderate/severe focal stenosis within a superior division mid M2 left MCA vessel (series 12, image 25). The anterior cerebral arteries are patent. No intracranial aneurysm is identified. Posterior circulation: The intracranial vertebral arteries are patent. The basilar artery is patent. The posterior cerebral arteries are patent. Posterior communicating arteries are diminutive or absent bilaterally. Venous sinuses: Within the limitations of contrast timing, no convincing thrombus. Anatomic variants: As described. Review of the MIP images confirms the above findings No emergent large vessel occlusion identified. These results were communicated to Dr. Wilford Corner at 12:55 pmon 4/22/2023by text page via the [facility] messaging system. IMPRESSION: CTA neck: 1. The common carotid, internal carotid and vertebral arteries are patent within the neck without stenosis. 2. 1.8 cm right thyroid lobe nodule. A non-emergent thyroid ultrasound is recommended for further evaluation. CTA head: 1. No intracranial large vessel occlusion is identified. 2. Atherosclerotic irregularity of the M2 and more distal MCA vessels, bilaterally. Most notably, there is an apparent moderate/severe focal stenosis within a superior division mid M2 left MCA vessel. Electronically Signed   By: Jackey Loge D.O.   On: 12/29/2021 12:59   ? ?PHYSICAL EXAM ? ?Physical Exam  ?Constitutional: Thin African-American female ?Cardiovascular: Normal rate and regular rhythm.  ?Respiratory: Effort normal, non-labored breathing ? ?Neuro: ?Mental Status: ?Patient is awake, alert, oriented to person, place, month, year, and situation. ?Patient is able to give coherent history.  Speech is dysarthric ?No signs of aphasia or neglect ?Cranial Nerves: ?II: Visual  Fields are full. Pupils are equal, round, and reactive to light.   ?III,IV, VI: EOMI without ptosis or diploplia.  ?V: Facial sensation is symmetric to temperature ?VII: Right facial droop ?VIII: Hearing is intact  to voice ?X: Palate elevates symmetrically ?XI: Shoulder shrug is symmetric. ?XII: Tongue protrudes midline without atrophy or fasciculations.  ?Motor: ?Tone is normal. Bulk is normal. ?Chronic RLE weakne

## 2021-12-31 ENCOUNTER — Inpatient Hospital Stay (HOSPITAL_COMMUNITY): Payer: BC Managed Care – PPO

## 2021-12-31 DIAGNOSIS — I639 Cerebral infarction, unspecified: Secondary | ICD-10-CM

## 2021-12-31 LAB — CBC WITH DIFFERENTIAL/PLATELET
Abs Immature Granulocytes: 0.05 10*3/uL (ref 0.00–0.07)
Basophils Absolute: 0.1 10*3/uL (ref 0.0–0.1)
Basophils Relative: 0 %
Eosinophils Absolute: 0.2 10*3/uL (ref 0.0–0.5)
Eosinophils Relative: 1 %
HCT: 35.4 % — ABNORMAL LOW (ref 36.0–46.0)
Hemoglobin: 11.5 g/dL — ABNORMAL LOW (ref 12.0–15.0)
Immature Granulocytes: 0 %
Lymphocytes Relative: 13 %
Lymphs Abs: 1.6 10*3/uL (ref 0.7–4.0)
MCH: 25.5 pg — ABNORMAL LOW (ref 26.0–34.0)
MCHC: 32.5 g/dL (ref 30.0–36.0)
MCV: 78.5 fL — ABNORMAL LOW (ref 80.0–100.0)
Monocytes Absolute: 1.2 10*3/uL — ABNORMAL HIGH (ref 0.1–1.0)
Monocytes Relative: 9 %
Neutro Abs: 9.4 10*3/uL — ABNORMAL HIGH (ref 1.7–7.7)
Neutrophils Relative %: 77 %
Platelets: 241 10*3/uL (ref 150–400)
RBC: 4.51 MIL/uL (ref 3.87–5.11)
RDW: 15.5 % (ref 11.5–15.5)
WBC: 12.4 10*3/uL — ABNORMAL HIGH (ref 4.0–10.5)
nRBC: 0 % (ref 0.0–0.2)

## 2021-12-31 LAB — URINALYSIS, MICROSCOPIC (REFLEX): WBC, UA: >50 WBC/hpf (ref 0–5)

## 2021-12-31 LAB — BASIC METABOLIC PANEL
Anion gap: 8 (ref 5–15)
BUN: 18 mg/dL (ref 6–20)
CO2: 20 mmol/L — ABNORMAL LOW (ref 22–32)
Calcium: 8.6 mg/dL — ABNORMAL LOW (ref 8.9–10.3)
Chloride: 114 mmol/L — ABNORMAL HIGH (ref 98–111)
Creatinine, Ser: 1.61 mg/dL — ABNORMAL HIGH (ref 0.44–1.00)
GFR, Estimated: 40 mL/min — ABNORMAL LOW (ref >60)
Glucose, Bld: 106 mg/dL — ABNORMAL HIGH (ref 70–99)
Potassium: 4.1 mmol/L (ref 3.5–5.1)
Sodium: 142 mmol/L (ref 135–145)

## 2021-12-31 LAB — GLUCOSE, CAPILLARY
Glucose-Capillary: 129 mg/dL — ABNORMAL HIGH (ref 70–99)
Glucose-Capillary: 86 mg/dL (ref 70–99)
Glucose-Capillary: 166 mg/dL — ABNORMAL HIGH (ref 70–99)
Glucose-Capillary: 225 mg/dL — ABNORMAL HIGH (ref 70–99)

## 2021-12-31 LAB — URINALYSIS, ROUTINE W REFLEX MICROSCOPIC
Bilirubin Urine: NEGATIVE
Glucose, UA: 100 mg/dL — AB
Ketones, ur: NEGATIVE mg/dL
Nitrite: NEGATIVE
Protein, ur: >300 mg/dL — AB
Specific Gravity, Urine: 1.025 (ref 1.005–1.030)
pH: 6 (ref 5.0–8.0)

## 2021-12-31 MED ORDER — SODIUM CHLORIDE 0.9 % IV SOLN: INTRAVENOUS | Status: DC

## 2021-12-31 MED ORDER — SODIUM CHLORIDE 0.9 % IV SOLN: INTRAVENOUS | Status: DC | PRN

## 2021-12-31 MED ORDER — SODIUM CHLORIDE 0.9 % IV SOLN
1.0000 g | INTRAVENOUS | Status: DC
  Administered 2021-12-31 – 2022-01-01 (×2): 1 g via INTRAVENOUS
  Filled 2021-12-31 (×3): qty 10

## 2021-12-31 MED ORDER — ALPRAZOLAM 0.25 MG PO TABS: 0.2500 mg | ORAL_TABLET | Freq: Three times a day (TID) | ORAL | Status: DC | PRN

## 2021-12-31 MED ORDER — SODIUM CHLORIDE 0.9 % IV SOLN: INTRAVENOUS | Status: AC

## 2021-12-31 MED ORDER — PANTOPRAZOLE SODIUM 40 MG PO TBEC
40.0000 mg | DELAYED_RELEASE_TABLET | Freq: Every day | ORAL | Status: DC
  Administered 2021-12-31: 40 mg via ORAL
  Filled 2021-12-31 (×2): qty 1

## 2021-12-31 MED ORDER — CEFTRIAXONE SODIUM 1 G IJ SOLR: 1.0000 g | INTRAMUSCULAR | Status: DC

## 2021-12-31 MED ORDER — SODIUM CHLORIDE 0.9 % IV BOLUS
500.0000 mL | Freq: Once | INTRAVENOUS | Status: AC
  Administered 2021-12-31: 500 mL via INTRAVENOUS

## 2021-12-31 NOTE — TOC CAGE-AID Note (Signed)
Transition of Care (TOC) - CAGE-AID Screening ? ? ?Patient Details  ?Name: Stacey Lucas ?MRN: [id number] ?Date of Birth: 1976/02/04 ? ?Transition of Care (TOC) CM/SW Contact:    ?Jiyan Walkowski C Tarpley-Carter, LCSWA ?Phone Number: ?12/31/2021, 1:34 PM ? ? ?Clinical Narrative: ?Pt is unable to participate in Cage Aid. ?CSW will attempt to assess at a better time. ? ?Insurance underwriter, MSW, LCSW-A ?Pronouns:  She/Her/Hers ?[facility] of [facility] ?Clinical Social Worker ?Direct Number:  [phone] ?[email or web address] ? ?CAGE-AID Screening: ?Substance Abuse Screening unable to be completed due to: : Patient unable to participate ? ?  ?  ?  ?  ?  ? ?Substance Abuse Education Offered: No ? ?  ? ? ? ? ? ? ?

## 2021-12-31 NOTE — Progress Notes (Signed)
TCD w/ bubble study completed.   Please see CV Proc for preliminary results.   Tiegan Terpstra, RDMS, RVT  

## 2021-12-31 NOTE — Progress Notes (Signed)
Inpatient Rehabilitation Admissions Coordinator  ? ?I met at bedside with patient . She is lethargic. Opens eyes but quickly goes back to asleep . I am following her ability to participate in therapies to assist with planning appropriate rehab venue. ? ?Danne Baxter, RN, MSN ?Rehab Admissions Coordinator ?[phone] ?12/31/2021 1:28 PM ? ?

## 2021-12-31 NOTE — Progress Notes (Signed)
Speech Language Pathology Treatment: Cognitive-Linquistic  ?Patient Details ?Name: Riely Oetken ?MRN: [id number] ?DOB: 04/17/1976 ?Today's Date: 12/31/2021 ?Time:  -  ?  ? ?Assessment / Plan / Recommendation ?Clinical Impression ? F/u after yesterday's inconclusive speech-language evaluation. Today, RN reports ongoing waxing/waning communication.  Ms. Carrero was more consistently communicative with me, answering biographical questions in full sentences with good accuracy regarding her family. No obvious word-finding difficulty. She continued to present with dysfluencies with whole-word repetitions, but with attempts to self-correct and improved pragmatics (smiling, joking). Pt preparing for EEG.  SLP will continue to follow for communication needs.  ?  ?HPI HPI: 46 y.o. female presented to EMS with weakness and slurred speech, resolving on way to ED. During stat MRI she developed recurring symptoms. MRI was aborted and she received IV TNK. Symptoms have waxed and waned since that time. MRI  4/23: 2.4 x 1.1 x 2.0 cm acute infarct within the left corona radiata/basal gangli; chronic small-vessel infarcts within the bilateral corona radiata/basal ganglia; moderate cerebral white matter chronic small vessel dz. past medical history includes right corona radiata stroke last year with residual balance deficits requiring walker to walk and cognitive deficits,, diabetes, hypertension, hyperlipidemia, migraines, Charcot involving the right leg with chronic right leg weakness. Per notes, family assists with ADLs and she uses a walker at baseline. ?  ?   ?SLP Plan ? Continue with current plan of care ? ?  ?  ?Recommendations for follow up therapy are one component of a multi-disciplinary discharge planning process, led by the attending physician.  Recommendations may be updated based on patient status, additional functional criteria and insurance authorization. ?  ? ?Recommendations  ?   ?   ?    ?   ? ? ? ? Follow Up  Recommendations: Other (comment) (tba) ?Assistance recommended at discharge: Frequent or constant Supervision/Assistance ?SLP Visit Diagnosis: Cognitive communication deficit (R41.841);Dysarthria and anarthria (R47.1) ?Plan: Continue with current plan of care ? ? ? ? ?  ? Makenzye Troutman L. Dylan Monforte, MA CCC/SLP ?Acute Rehabilitation Services ?Office number [phone] ?Pager [phone] ? ? ? ?Blenda Mounts Laurice ? ?12/31/2021, 2:40 PM ?

## 2021-12-31 NOTE — Progress Notes (Addendum)
STROKE TEAM PROGRESS NOTE  ? ?INTERVAL HISTORY ?The patient is seen in her room this morning with no family at bedside. She appears lethargic and exhibits reduced R sided strength compared to yesterday. STAT CTH unremarkable without any acute change.  EEG also obtained which was normal ?She was very anxious yesterday and was started on Celexa and Xanax.  Urine drug screen was positive for cocaine ?Vitals:  ? 12/31/21 0900 12/31/21 1000 12/31/21 1100 12/31/21 1200  ?BP: 114/83 116/62 128/80 133/81  ?Pulse: 92 83 83 80  ?Resp:  (!) 23 (!) 0 (!) 22  ?Temp:      ?TempSrc:      ?SpO2: 100% 100% 100% 100%  ?Weight:      ? ?CBC:  ?Recent Labs  ?Lab 12/29/21 ?1217 12/29/21 ?1241  ?WBC 11.1*  --   ?NEUTROABS 5.8  --   ?HGB 10.3* 9.5*  ?HCT 30.7* 28.0*  ?MCV 78.1*  --   ?PLT 222  --   ? ?Basic Metabolic Panel:  ?Recent Labs  ?Lab 12/29/21 ?1217 12/29/21 ?1241 12/30/21 ?1422  ?NA 142 143 141  ?K 3.2* 3.1* 3.3*  ?CL 114* 110 112*  ?CO2 23  --  21*  ?GLUCOSE 117* 111* 135*  ?BUN 15 14 13   ?CREATININE 1.33* 1.30* 1.33*  ?CALCIUM 7.8*  --  8.4*  ? ?Lipid Panel:  ?Recent Labs  ?Lab 12/30/21 ?0305  ?CHOL 202*  ?TRIG 153*  ?HDL 54  ?CHOLHDL 3.7  ?VLDL 31  ?LDLCALC 117*  ? ?HgbA1c:  ?Recent Labs  ?Lab 12/29/21 ?1217  ?HGBA1C 7.1*  ? ?Urine Drug Screen:  ?Recent Labs  ?Lab 12/30/21 ?2138  ?LABOPIA NONE DETECTED  ?COCAINSCRNUR POSITIVE*  ?LABBENZ NONE DETECTED  ?AMPHETMU NONE DETECTED  ?THCU NONE DETECTED  ?LABBARB NONE DETECTED  ?  ?Alcohol Level No results for input(s): ETH in the last 168 hours. ? ?IMAGING past 24 hours ?CT HEAD WO CONTRAST (5MM) ? ?Result Date: 12/31/2021 ?CLINICAL DATA:  Stroke, follow-up EXAM: CT HEAD WITHOUT CONTRAST TECHNIQUE: Contiguous axial images were obtained from the base of the skull through the vertex without intravenous contrast. RADIATION DOSE REDUCTION: This exam was performed according to the departmental dose-optimization program which includes automated exposure control, adjustment of the mA and/or  kV according to patient size and/or use of iterative reconstruction technique. COMPARISON:  12/29/2021 FINDINGS: Brain: Evolving recent infarction involving the left corona radiata and basal ganglia. Chronic bilateral basal ganglia infarcts. Additional patchy hypoattenuation in the supratentorial white matter likely reflects nonspecific gliosis/demyelination. No acute intracranial hemorrhage or mass effect. Ventricles are normal in size. No extra-axial collection. Vascular: There is mild atherosclerotic calcification at the skull base. Skull: Calvarium is unremarkable. Sinuses/Orbits: No acute finding. Other: None. IMPRESSION: Evolving recent infarction of left corona radiata and basal ganglia. No acute intracranial hemorrhage. Chronic infarcts and chronic microvascular ischemic changes. Electronically Signed   By: Macy Mis M.D.   On: 12/31/2021 10:52  ? ?MR BRAIN WO CONTRAST ? ?Result Date: 12/30/2021 ?CLINICAL DATA:  Provided history: Stroke, follow-up. EXAM: MRI HEAD WITHOUT CONTRAST TECHNIQUE: Multiplanar, multiecho pulse sequences of the brain and surrounding structures were obtained without intravenous contrast. COMPARISON:  CT angiogram head/neck 12/29/2021. Noncontrast head CT 12/29/2021. Brain MRI 05/23/2021. FINDINGS: Brain: The patient was unable to tolerate the full examination. As a result, axial T1 weighted and coronal T2 TSE sequences could not be obtained. The acquired sequences are intermittently motion degraded. Most notably, there is severe motion degradation of the axial SWI sequence. No age advanced  or lobar predominant parenchymal atrophy. 2.4 x 1.1 x 2.0 cm acute infarct within the left corona radiata/basal ganglia. Some residual diffusion weighted hyperintensity at site of a chronic infarct within the right corona radiata. Chronic lacunar infarcts within the bilateral corona radiata/deep gray nuclei. Moderate (and significantly advanced for age) multifocal T2 FLAIR hyperintense signal  abnormality elsewhere within the cerebral white matter, nonspecific but likely reflecting chronic small vessel ischemic disease given the patient's history of hypertension, diabetes and hyperlipidemia. Mild chronic small-vessel ischemic changes are also present within the pons No evidence of an intracranial mass. No extra-axial fluid collection. No midline shift. Vascular: Maintained flow voids within the proximal large arterial vessels. Skull and upper cervical spine: No focal suspicious marrow lesion. Sinuses/Orbits: Visualized orbits show no acute finding. Minimal scattered paranasal sinus mucosal thickening. IMPRESSION: Prematurely terminated and motion degraded examination. 2.4 x 1.1 x 2.0 cm acute infarct within the left corona radiata/basal ganglia. Superimposed chronic small-vessel infarcts within the bilateral corona radiata/basal ganglia. Background moderate cerebral white matter chronic small vessel ischemic disease. Mild chronic small vessel ischemic changes are also present within the pons. Electronically Signed   By: Kellie Simmering D.O.   On: 12/30/2021 13:20   ? ?PHYSICAL EXAM ? ?Physical Exam  ?Constitutional: Thin African-American female ?Cardiovascular: Normal rate and regular rhythm.  ?Respiratory: Effort normal, non-labored breathing ? ?Neuro: ?Mental Status: ?Patient is lethargic ?Speech is dysarthric ?No signs of aphasia or neglect ?Cranial Nerves: ?II: Visual Fields are full. Pupils are equal, round, and reactive to light.   ?III,IV, VI: EOMI without ptosis or diploplia.  ?V: Facial sensation is symmetric to temperature ?VII: Right facial droop ?VIII: Hearing is intact to voice ?X: Palate elevates symmetrically ?XI: Shoulder shrug is symmetric. ?XII: Tongue protrudes midline without atrophy or fasciculations.  ?Motor: ?Tone is normal. Bulk is normal. ?Chronic RLE weakness, unable to plantar or dorsiflex her right foot ?RUE 1/5  LUE 5/5 ?RLE 1/5   LLE 5/5   right grip weakness.  Orbits left over  right upper extremity.  Finding the movements are diminished on the right.  Tone is increased on the right compared to the left. ?Sensory: ?Sensation is symmetric to light touch and temperature in the arms and legs. No extinction to DSS present.  ?Cerebellar: ?FNF intact bilaterally ?Essential tremor noted in right upper extremity ? ? ?ASSESSMENT/PLAN ?Ms. Fredna Winnie is a 46 y.o. 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 She had near resolution of original symptoms (left side weakness) and on the way to MRI developed right side weakness, dysarthria, and a facial droop. MRI was aborted and TNKase was given in ED. MRI today shows an acute infarct of the left corona radiata/basal ganglia.  Plan for TCD bubble study and a TEE.  DAPT therapy started 24 hours post TNK.  DVT prophylaxis with Lovenox. ? 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 Small vessel disease. Atrophy. ASPECTS 10.    ?CTA head & neck Atherosclerotic irregularity of the M2 and more distal MCA vessels, bilaterally. Most notably, there is an apparent moderate/severe focal stenosis within a superior division mid M2 left MCA vessel. ?MRI  2.4 x 1.1 x 2.0 cm acute infarct within the  left corona radiata/basal ganglia. ?2D Echo EF 70-75% with grade I diastolic dysfunction ?LDL 117 ?HgbA1c 7.1 ?VTE prophylaxis - lovenox  ?   ?Diet  ? Diet heart healthy/carb modified Room service appropriate? Yes with Assist; Fluid consistency: Thin  ? ?No antithrombotic prior to admission, now on aspirin 81 mg daily and clopidogrel 75 mg daily.  For 3 weeks and then aspirin  alone. ?Therapy recommendations:  CIR ?Disposition:  pending ? ?Hypertension ?Home meds:  valsartan, toprol XL ?Stable ?Permissive hypertension (OK if < 220/120) but gradually normalize in 5-7 days ?Long-term B

## 2021-12-31 NOTE — Evaluation (Signed)
Occupational Therapy Evaluation ?Patient Details ?Name: Stacey Lucas ?MRN: [id number] ?DOB: [birth year] ?Today's Date: 12/31/2021 ? ? ?History of Present Illness 46 y.o. female presents to [facility] hospital with weakness, L facial droop and slurred speech. Once at hospital pt developing R facial weakness. MRI cancelled due to fluctuating neurological symptoms. Pt received tNK. PMH includes R CVA, cognitive deficits, DM, HTN, HLD, charcot involving RLE.  ? ?Clinical Impression ?  ?This 46 yo female admitted with above presents to acute OT with PLOF of being totally independent with basic ADLs and managing kids at home. Currently she is total A for all basic ADLs due to lethargy, decreased AROM R>L side, and decreased sitting and standing balance. She will continue to benefit from acute OT with follow up on AIR. ?   ? ?Recommendations for follow up therapy are one component of a multi-disciplinary discharge planning process, led by the attending physician.  Recommendations may be updated based on patient status, additional functional criteria and insurance authorization.  ? ?Follow Up Recommendations ? Acute inpatient rehab (3hours/day)  ?  ?Assistance Recommended at Discharge Frequent or constant Supervision/Assistance  ?Patient can return home with the following A lot of help with walking and/or transfers;A lot of help with bathing/dressing/bathroom;Assistance with cooking/housework;Assistance with feeding;Assist for transportation;Help with stairs or ramp for entrance;Direct supervision/assist for financial management;Direct supervision/assist for medications management ? ?  ?Functional Status Assessment ? Patient has had a recent decline in their functional status and demonstrates the ability to make significant improvements in function in a reasonable and predictable amount of time.  ?Equipment Recommendations ? Other (comment) (TBD next venue)  ?  ?   ?Precautions / Restrictions Precautions ?Precautions: Fall ?Precaution  Comments: R charcot foot ?Restrictions ?Weight Bearing Restrictions: No  ? ?  ? ?Mobility Bed Mobility ?Overal bed mobility: Needs Assistance ?Bed Mobility: Supine to Sit ?  ?  ?Supine to sit: Max assist, HOB elevated ?  ?  ?General bed mobility comments: Pt did help with moving LLE to EOB and coming up with trunk ?  ? ?Transfers ?Overall transfer level: Needs assistance ?  ?Transfers: Sit to/from Stand, Bed to chair/wheelchair/BSC ?Sit to Stand: Mod assist ?Stand pivot transfers: Max assist ?  ?  ?  ?  ?  ?  ? ?  ?Balance Overall balance assessment: Needs assistance ?Sitting-balance support: Single extremity supported ?Sitting balance-Leahy Scale: Poor ?Sitting balance - Comments: Pt did attempt and was successful in regaining her balance x3 while statically sitting EOB ?  ?Standing balance support: Single extremity supported ?Standing balance-Leahy Scale: Poor ?Standing balance comment: maxA ?  ?  ?  ?  ?  ?  ?  ?  ?  ?  ?  ?   ? ?ADL either performed or assessed with clinical judgement  ? ?ADL Overall ADL's : Needs assistance/impaired ?  ?  ?  ?  ?  ?  ?  ?  ?  ?  ?  ?  ?  ?  ?  ?  ?  ?  ?  ?General ADL Comments: currently total A for all basic ADLs with Mod A sit<>stand and Max A to stand pivot  ? ? ? ?Vision   ?Additional Comments: She reports she does not wear glasses and currently not seeing double; unable to test her vision to lethargy  ?   ?   ?   ? ?Pertinent Vitals/Pain Pain Assessment ?Pain Assessment: Faces ?Faces Pain Scale: No hurt  ? ? ? ?Hand Dominance Right ?  ?  Extremity/Trunk Assessment Upper Extremity Assessment ?Upper Extremity Assessment: RUE deficits/detail;LUE deficits/detail ?RUE Deficits / Details: No actively trying to use RUE today for any activity ?LUE Deficits / Details: Attempted to use it to wash face but was only 1/4 of the way successful ?LUE Coordination: decreased gross motor;decreased fine motor ?  ?  ?  ?  ?  ?Communication Communication ?Communication: Expressive difficulties  (low tone of voice, pt very lethargic) ?  ?Cognition Arousal/Alertness: Lethargic ?Behavior During Therapy: Flat affect ?Overall Cognitive Status: No family/caregiver present to determine baseline cognitive functioning ?  ?  ?  ?  ?  ?  ?  ?  ?  ?  ?  ?  ?  ?  ?  ?  ?General Comments: Pt was oriented to place, month and year; but not situation ?  ?  ?   ?   ?   ? ? ?Home Living Family/patient expects to be discharged to:: Inpatient rehab ?Living Arrangements: Spouse/significant other;Children ?Available Help at Discharge: Family;Available PRN/intermittently ?Type of Home: Apartment ?Home Access: Level entry ?  ?  ?Home Layout: One level ?  ?  ?  ?  ?  ?  ?  ?  ?  ?Additional Comments: pt lethargic during history, unable to provide full reliable history at this time ?  ? ?  ?Prior Functioning/Environment Prior Level of Function : Patient poor historian/Family not available ?  ?  ?  ?  ?  ?  ?  ?ADLs Comments: pt unable to report prior level of function with ADLs ?  ? ?  ?  ?OT Problem List: Decreased strength;Decreased range of motion;Impaired balance (sitting and/or standing);Decreased coordination;Decreased cognition;Decreased safety awareness;Impaired UE functional use;Impaired tone ?  ?   ?OT Treatment/Interventions: Self-care/ADL training;DME and/or AE instruction;Patient/family education;Balance training;Therapeutic exercise;Therapeutic activities;Neuromuscular education  ?  ?OT Goals(Current goals can be found in the care plan section) Acute Rehab OT Goals ?Patient Stated Goal: agreed to get up into recliner ?OT Goal Formulation: With patient ?Time For Goal Achievement: 01/14/22 ?Potential to Achieve Goals: Good  ?OT Frequency: Min 3X/week ?  ? ?   ?AM-PAC OT "6 Clicks" Daily Activity     ?Outcome Measure Help from another person eating meals?: Total ?Help from another person taking care of personal grooming?: Total ?Help from another person toileting, which includes using toliet, bedpan, or urinal?:  Total ?Help from another person bathing (including washing, rinsing, drying)?: Total ?Help from another person to put on and taking off regular upper body clothing?: Total ?Help from another person to put on and taking off regular lower body clothing?: Total ?6 Click Score: 6 ?  ?End of Session Equipment Utilized During Treatment: Gait belt ?Nurse Communication: Mobility status ? ?Activity Tolerance: Patient limited by lethargy ?Patient left: in chair;with call bell/phone within reach;with chair alarm set ? ?OT Visit Diagnosis: Unsteadiness on feet (R26.81);Other abnormalities of gait and mobility (R26.89);Muscle weakness (generalized) (M62.81);Other symptoms and signs involving cognitive function;Hemiplegia and hemiparesis ?Hemiplegia - Right/Left:  (both (R>L)) ?Hemiplegia - dominant/non-dominant: Dominant;Non-Dominant ?Hemiplegia - caused by: Cerebral infarction  ?              ?Time: 4132-4401 ?OT Time Calculation (min): 22 min ?Charges:  OT General Charges ?$OT Visit: 1 Visit ?OT Evaluation ?$OT Eval Moderate Complexity: 1 Mod ? ?Ignacia Palma, OTR/L ?Acute Rehab Services ?Pager [phone] ?Office [phone] ? ? ? ?Evette Georges ?12/31/2021, 1:36 PM ?

## 2021-12-31 NOTE — Progress Notes (Signed)
?  Transition of Care (TOC) Screening Note ? ? ?Patient Details  ?Name: Stacey Lucas ?Date of Birth: Feb 07, 1976 ? ? ?Transition of Care (TOC) CM/SW Contact:    ?Mearl Latin, LCSW ?Phone Number: ?12/31/2021, 8:34 AM ? ? ? ?Transition of Care Department [facility]) has reviewed patient and no TOC needs have been identified at this time. We will continue to monitor patient advancement through interdisciplinary progression rounds. If new patient transition needs arise, please place a TOC consult. ? ? ?

## 2021-12-31 NOTE — Procedures (Signed)
Patient Name: Stacey Lucas  ?MRN: [id number]  ?Epilepsy Attending: Charlsie Quest  ?Referring Physician/Provider: Micki Riley, MD ?Date: 12/31/2021 ?Duration: 21.53 mins ? ?Patient history: 46 year old female with acute left corona radiata/basal ganglia stroke.  EEG evaluate for seizure. ? ?Level of alertness: Awake ? ?AEDs during EEG study: None ? ?Technical aspects: This EEG study was done with scalp electrodes positioned according to the 10-20 International system of electrode placement. Electrical activity was acquired at a sampling rate of 500Hz  and reviewed with a high frequency filter of 70Hz  and a low frequency filter of 1Hz . EEG data were recorded continuously and digitally stored.  ? ?Description: The posterior dominant rhythm consists of 9 Hz activity of moderate voltage (25-35 uV) seen predominantly in posterior head regions, symmetric and reactive to eye opening and eye closing. Hyperventilation and photic stimulation were not performed.    ? ?IMPRESSION: ?This study is within normal limits. No seizures or epileptiform discharges were seen throughout the recording. ? ?  ? ?

## 2021-12-31 NOTE — Progress Notes (Signed)
EEG complete - results pending 

## 2021-12-31 NOTE — Progress Notes (Signed)
Physical Therapy Treatment ?Patient Details ?Name: Stacey Lucas ?MRN: [id number] ?DOB: Feb 18, 1976 ?Today's Date: 12/31/2021 ? ? ?History of Present Illness 46 y.o. female presents to [facility] hospital with weakness, L facial droop and slurred speech. Once at hospital pt developing R facial weakness. MRI cancelled due to fluctuating neurological symptoms. Pt received tNK. PMH includes R CVA, cognitive deficits, DM, HTN, HLD, charcot involving RLE. ? ?  ?PT Comments  ? ? Pt lethargic today, had a stat CT of head which was neg. Was arousable to voice and tactile stimulation but fell back asleep as soon as stimulation ceased. Pt was in recliner from OT session. Worked on engaging trunk and scoot fwd and bkwd in chair as well as leaning fwd and back. When pt alert was able to get a few full sentences out in response to questions. Assisted pt with eating some of her peaches with L hand, pt brings spoon below mouth, min A to correct. Worked on sit>stand from recliner with partial stand 3x with mod A. Required max A +2 to stand all the way up. Used stedy to work on standing tolerance and pivot back to bed. PT will continue to follow. ?   ?Recommendations for follow up therapy are one component of a multi-disciplinary discharge planning process, led by the attending physician.  Recommendations may be updated based on patient status, additional functional criteria and insurance authorization. ? ?Follow Up Recommendations ? Acute inpatient rehab (3hours/day) ?  ?  ?Assistance Recommended at Discharge Frequent or constant Supervision/Assistance  ?Patient can return home with the following Two people to help with walking and/or transfers;Two people to help with bathing/dressing/bathroom;Assistance with cooking/housework;Assistance with feeding;Direct supervision/assist for medications management;Direct supervision/assist for financial management;Assist for transportation;Help with stairs or ramp for entrance ?  ?Equipment  Recommendations ? Wheelchair (measurements PT);Hospital bed (hoyer lift)  ?  ?Recommendations for Other Services Rehab consult ? ? ?  ?Precautions / Restrictions Precautions ?Precautions: Fall ?Precaution Comments: R charcot foot ?Restrictions ?Weight Bearing Restrictions: No  ?  ? ?Mobility ? Bed Mobility ?Overal bed mobility: Needs Assistance ?Bed Mobility: Sit to Supine ?  ?  ?  ?Sit to supine: Max assist ?  ?General bed mobility comments: pt assisted with LUE and lifting LLE, max A to R side ?  ? ?Transfers ?Overall transfer level: Needs assistance ?Equipment used: Ambulation equipment used ?Transfers: Sit to/from Stand, Bed to chair/wheelchair/BSC ?Sit to Stand: Max assist ?Stand pivot transfers: Max assist ?  ?  ?  ?  ?General transfer comment: worked on sit>stand from recliner 3x with pt unable to achieve standing with min or mod A, came partially up each time. Able to stand with max A +2. Used stedy to blocok R knee and to work on [employer] through [employer] in standing. Turned chair to bed with stedy ?Transfer via Lift Equipment: Stedy ? ?Ambulation/Gait ?  ?  ?  ?  ?  ?  ?  ?  ? ? ?Stairs ?  ?  ?  ?  ?  ? ? ?Wheelchair Mobility ?  ? ?Modified Rankin (Stroke Patients Only) ?Modified Rankin (Stroke Patients Only) ?Pre-Morbid Rankin Score: Moderately severe disability ?Modified Rankin: Severe disability ? ? ?  ?Balance Overall balance assessment: Needs assistance ?Sitting-balance support: Single extremity supported ?Sitting balance-Leahy Scale: Poor ?Sitting balance - Comments: R lean in chair, able to correct with vc's. Worked on scoot to edge of seat and scooting back into seat, has difficulty scooting R hip back ?Postural control: Right lateral lean ?Standing  balance support: Single extremity supported ?Standing balance-Leahy Scale: Poor ?Standing balance comment: maxA ?  ?  ?  ?  ?  ?  ?  ?  ?  ?  ?  ?  ? ?  ?Cognition Arousal/Alertness: Lethargic ?Behavior During Therapy: Flat affect ?Overall Cognitive Status: No  family/caregiver present to determine baseline cognitive functioning ?  ?  ?  ?  ?  ?  ?  ?  ?  ?  ?  ?  ?  ?  ?  ?  ?General Comments: pt lethargic unless directly being stimulated. Was able to to verbalize several full sentences at times ?  ?  ? ?  ?Exercises   ? ?  ?General Comments General comments (skin integrity, edema, etc.): VSS on RA. Pt back to sleep within 5 mins of end of session ?  ?  ? ?Pertinent Vitals/Pain Pain Assessment ?Pain Assessment: Faces ?Faces Pain Scale: Hurts a little bit ?Pain Location: R foot ?Pain Descriptors / Indicators: Grimacing ?Pain Intervention(s): Monitored during session  ? ? ?Home Living Family/patient expects to be discharged to:: Inpatient rehab ?Living Arrangements: Spouse/significant other;Children ?Available Help at Discharge: Family;Available PRN/intermittently ?Type of Home: Apartment ?Home Access: Level entry ?  ?  ?  ?Home Layout: One level ?  ?Additional Comments: pt lethargic during history, unable to provide full reliable history at this time  ?  ?Prior Function    ?  ?  ?   ? ?PT Goals (current goals can now be found in the care plan section) Acute Rehab PT Goals ?Patient Stated Goal: to improve mobility and balance in an effort to reduce falls risk ?PT Goal Formulation: With patient ?Time For Goal Achievement: 01/13/22 ?Potential to Achieve Goals: Fair ?Progress towards PT goals: Not progressing toward goals - comment (lethargy) ? ?  ?Frequency ? ? ? Min 4X/week ? ? ? ?  ?PT Plan Current plan remains appropriate  ? ? ?Co-evaluation   ?  ?  ?  ?  ? ?  ?AM-PAC PT "6 Clicks" Mobility   ?Outcome Measure ? Help needed turning from your back to your side while in a flat bed without using bedrails?: A Lot ?Help needed moving from lying on your back to sitting on the side of a flat bed without using bedrails?: A Lot ?Help needed moving to and from a bed to a chair (including a wheelchair)?: A Lot ?Help needed standing up from a chair using your arms (e.g., wheelchair or  bedside chair)?: A Lot ?Help needed to walk in hospital room?: Total ?Help needed climbing 3-5 steps with a railing? : Total ?6 Click Score: 10 ? ?  ?End of Session Equipment Utilized During Treatment: Gait belt ?Activity Tolerance: Patient tolerated treatment well ?Patient left: with call bell/phone within reach;in bed;with bed alarm set ?Nurse Communication: Mobility status ?PT Visit Diagnosis: Other abnormalities of gait and mobility (R26.89);Muscle weakness (generalized) (M62.81);Hemiplegia and hemiparesis;Other symptoms and signs involving the nervous system (R29.898) ?Hemiplegia - Right/Left: Right ?Hemiplegia - dominant/non-dominant: Dominant ?Hemiplegia - caused by: Cerebral infarction ?  ? ? ?Time: 1232-1300 ?PT Time Calculation (min) (ACUTE ONLY): 28 min ? ?Charges:  $Therapeutic Activity: 23-37 mins          ?          ? ?Lyanne Co, PT  ?Acute Rehab Services ? Pager [phone] ?Office [phone] ? ? ? ?Dhilan Brauer L Reynard Christoffersen ?12/31/2021, 2:01 PM ? ?

## 2021-12-31 NOTE — Progress Notes (Signed)
Patient very lethargic, more than this AM. Attempted to wake for PO meds, unable to drink from straw. Will hold for patient safety. MD aware. ?

## 2021-12-31 NOTE — Progress Notes (Signed)
? ? ?  [facility] has been requested to perform a transesophageal echocardiogram on Stacey Lucas for stroke.  After careful review of history and examination, the risks and benefits of transesophageal echocardiogram have been explained including risks of esophageal damage, perforation (1:10,000 risk), bleeding, pharyngeal hematoma as well as other potential complications associated with conscious sedation including aspiration, arrhythmia, respiratory failure and death. Alternatives to treatment were discussed, questions were answered. Patient is willing to proceed.  ? ?Nada Boozer, NP  ?12/31/2021 5:06 PM  ? ?

## 2022-01-01 ENCOUNTER — Encounter (HOSPITAL_COMMUNITY)
Admission: EM | Disposition: A | Discharge status: Rehabilitation Facility | Payer: Self-pay | Source: Home / Self Care | Attending: Neurology

## 2022-01-01 ENCOUNTER — Inpatient Hospital Stay (HOSPITAL_COMMUNITY)
Admit: 2022-01-01 | Discharge: 2022-01-01 | Disposition: A | Discharge status: Home / Self Care | Payer: BC Managed Care – PPO | Attending: Cardiology | Admitting: Cardiology

## 2022-01-01 ENCOUNTER — Inpatient Hospital Stay (HOSPITAL_COMMUNITY): Payer: BC Managed Care – PPO | Admitting: Certified Registered Nurse Anesthetist

## 2022-01-01 DIAGNOSIS — I639 Cerebral infarction, unspecified: Secondary | ICD-10-CM | POA: Diagnosis not present

## 2022-01-01 DIAGNOSIS — I10 Essential (primary) hypertension: Secondary | ICD-10-CM

## 2022-01-01 HISTORY — PX: TEE WITHOUT CARDIOVERSION: SHX5443

## 2022-01-01 HISTORY — PX: BUBBLE STUDY: SHX6837

## 2022-01-01 LAB — GLUCOSE, CAPILLARY
Glucose-Capillary: 170 mg/dL — ABNORMAL HIGH (ref 70–99)
Glucose-Capillary: 55 mg/dL — ABNORMAL LOW (ref 70–99)
Glucose-Capillary: 90 mg/dL (ref 70–99)
Glucose-Capillary: 74 mg/dL (ref 70–99)
Glucose-Capillary: 115 mg/dL — ABNORMAL HIGH (ref 70–99)
Glucose-Capillary: 206 mg/dL — ABNORMAL HIGH (ref 70–99)

## 2022-01-01 LAB — BASIC METABOLIC PANEL
Anion gap: 3 — ABNORMAL LOW (ref 5–15)
BUN: 20 mg/dL (ref 6–20)
CO2: 20 mmol/L — ABNORMAL LOW (ref 22–32)
Calcium: 8.2 mg/dL — ABNORMAL LOW (ref 8.9–10.3)
Chloride: 117 mmol/L — ABNORMAL HIGH (ref 98–111)
Creatinine, Ser: 1.66 mg/dL — ABNORMAL HIGH (ref 0.44–1.00)
GFR, Estimated: 38 mL/min — ABNORMAL LOW (ref >60)
Glucose, Bld: 124 mg/dL — ABNORMAL HIGH (ref 70–99)
Potassium: 3.9 mmol/L (ref 3.5–5.1)
Sodium: 140 mmol/L (ref 135–145)

## 2022-01-01 LAB — URINE CULTURE: Culture: >=100000 — AB

## 2022-01-01 LAB — PROTIME-INR
INR: 1 (ref 0.8–1.2)
Prothrombin Time: 12.7 seconds (ref 11.4–15.2)

## 2022-01-01 SURGERY — ECHOCARDIOGRAM, TRANSESOPHAGEAL
Anesthesia: Monitor Anesthesia Care

## 2022-01-01 MED ORDER — PROPOFOL 10 MG/ML IV BOLUS
INTRAVENOUS | Status: DC | PRN
  Administered 2022-01-01: 30 mg via INTRAVENOUS
  Administered 2022-01-01: 20 mg via INTRAVENOUS

## 2022-01-01 MED ORDER — LIDOCAINE 2% (20 MG/ML) 5 ML SYRINGE
INTRAMUSCULAR | Status: DC | PRN
  Administered 2022-01-01: 60 mg via INTRAVENOUS

## 2022-01-01 MED ORDER — DEXTROSE 50 % IV SOLN
INTRAVENOUS | Status: AC
  Filled 2022-01-01: qty 50

## 2022-01-01 MED ORDER — PROPOFOL 500 MG/50ML IV EMUL
INTRAVENOUS | Status: DC | PRN
  Administered 2022-01-01: 100 ug/kg/min via INTRAVENOUS

## 2022-01-01 MED ORDER — DEXTROSE 50 % IV SOLN
0.5000 | Freq: Once | INTRAVENOUS | Status: AC
Start: 2022-01-01 — End: 2022-01-01
  Administered 2022-01-01: 25 mL via INTRAVENOUS

## 2022-01-01 NOTE — Progress Notes (Signed)
Physical Therapy Treatment ?Patient Details ?Name: Stacey Lucas ?MRN: [id number] ?DOB: 1976/01/13 ?Today's Date: 01/01/2022 ? ? ?History of Present Illness 46 y.o. female presents to [facility] hospital with weakness, L facial droop and slurred speech. Once at hospital pt developing R facial weakness. MRI cancelled due to fluctuating neurological symptoms. Pt received tNK. PMH includes R CVA, cognitive deficits, DM, HTN, HLD, charcot involving RLE. ? ?  ?PT Comments  ? ? Pt very alert today and engaged in session as well as verbalizing and following commands. Pt able to come to EOB from elevated position with min A and extra time with vc's to use LUE/LE to assist R side. Pt stood with therapist in front with max A and with RW in front of her with mod A. Pt took small steps to chair with max A +2 for safety with blocking of R knee and manual facilitation of wt shifting. Pt would greatly benefit from AIR level therapy. PT will continue to follow. ?   ?Recommendations for follow up therapy are one component of a multi-disciplinary discharge planning process, led by the attending physician.  Recommendations may be updated based on patient status, additional functional criteria and insurance authorization. ? ?Follow Up Recommendations ? Acute inpatient rehab (3hours/day) ?  ?  ?Assistance Recommended at Discharge Frequent or constant Supervision/Assistance  ?Patient can return home with the following Two people to help with walking and/or transfers;Two people to help with bathing/dressing/bathroom;Assistance with cooking/housework;Assistance with feeding;Direct supervision/assist for medications management;Direct supervision/assist for financial management;Assist for transportation;Help with stairs or ramp for entrance ?  ?Equipment Recommendations ? Wheelchair (measurements PT);Hospital bed (hoyer lift)  ?  ?Recommendations for Other Services Rehab consult ? ? ?  ?Precautions / Restrictions Precautions ?Precautions:  Fall ?Precaution Comments: R charcot foot ?Restrictions ?Weight Bearing Restrictions: No  ?  ? ?Mobility ? Bed Mobility ?Overal bed mobility: Needs Assistance ?Bed Mobility: Supine to Sit ?  ?  ?Supine to sit: HOB elevated, Min assist ?  ?  ?General bed mobility comments: Pt able to come up to long sitting, cued to use LUE and LLE to move RLE off EOB. vc's to scoot to EOB. Pt able to do so with increased time and min A for safety as pt came close to edge ?  ? ?Transfers ?Overall transfer level: Needs assistance ?Equipment used: Rolling walker (2 wheels) ?Transfers: Sit to/from Stand, Bed to chair/wheelchair/BSC ?Sit to Stand: Mod assist ?  ?Step pivot transfers: Max assist, +2 safety/equipment ?  ?  ?  ?General transfer comment: sit<>stand with no AD with therapist in front with mod A and R knee blocked. Pt then stood to RW with R knee blocked and assist to R hand to grasp RW. Pt took 3 steps to chair without AD and max A for wt shifting and advancing of RLE and support at R knee to advance LLE. Pt with R knee buckle ?  ? ?Ambulation/Gait ?  ?  ?  ?  ?  ?  ?  ?  ? ? ?Stairs ?  ?  ?  ?  ?  ? ? ?Wheelchair Mobility ?  ? ?Modified Rankin (Stroke Patients Only) ?Modified Rankin (Stroke Patients Only) ?Pre-Morbid Rankin Score: Moderately severe disability ?Modified Rankin: Severe disability ? ? ?  ?Balance Overall balance assessment: Needs assistance ?Sitting-balance support: Single extremity supported ?Sitting balance-Leahy Scale: Fair ?Sitting balance - Comments: pt tends to lean R but able to correct with vc's and maintain sitting without physical assist ?Postural control: Right lateral  lean ?Standing balance support: Single extremity supported ?Standing balance-Leahy Scale: Poor ?Standing balance comment: Max A/ mod A with RW. Maintained standing 2-3 mins each stand ?  ?  ?  ?  ?  ?  ?  ?  ?  ?  ?  ?  ? ?  ?Cognition Arousal/Alertness: Awake/alert ?Behavior During Therapy: Flat affect ?Overall Cognitive Status:  Difficult to assess ?  ?  ?  ?  ?  ?  ?  ?  ?  ?  ?  ?  ?  ?  ?  ?  ?General Comments: conversing today, occasionally unable to understand what she is saying. Continues to move LLE when cued to move R but when attention brought to this able to move R foot ?  ?  ? ?  ?Exercises General Exercises - Lower Extremity ?Ankle Circles/Pumps: AROM, Right, Seated, 10 reps ?Quad Sets: PROM, Right, 10 reps, Supine ? ?  ?General Comments General comments (skin integrity, edema, etc.): VSS on RA. Pt maintaining R hip ER and knee flexion in the bed. Discussed with pt and RN positioning in extension periodically to prevent knee flexion contracture. Pt positioned this way in recliner after session with blanket outside of leg to reduce ER ?  ?  ? ?Pertinent Vitals/Pain Pain Assessment ?Pain Assessment: No/denies pain  ? ? ?Home Living   ?  ?  ?  ?  ?  ?  ?  ?  ?  ?   ?  ?Prior Function    ?  ?  ?   ? ?PT Goals (current goals can now be found in the care plan section) Acute Rehab PT Goals ?Patient Stated Goal: to improve mobility and balance in an effort to reduce falls risk ?PT Goal Formulation: With patient ?Time For Goal Achievement: 01/13/22 ?Potential to Achieve Goals: Fair ?Progress towards PT goals: Progressing toward goals ? ?  ?Frequency ? ? ? Min 4X/week ? ? ? ?  ?PT Plan Current plan remains appropriate  ? ? ?Co-evaluation   ?  ?  ?  ?  ? ?  ?AM-PAC PT "6 Clicks" Mobility   ?Outcome Measure ? Help needed turning from your back to your side while in a flat bed without using bedrails?: A Lot ?Help needed moving from lying on your back to sitting on the side of a flat bed without using bedrails?: A Lot ?Help needed moving to and from a bed to a chair (including a wheelchair)?: A Lot ?Help needed standing up from a chair using your arms (e.g., wheelchair or bedside chair)?: A Lot ?Help needed to walk in hospital room?: Total ?Help needed climbing 3-5 steps with a railing? : Total ?6 Click Score: 10 ? ?  ?End of Session  Equipment Utilized During Treatment: Gait belt ?Activity Tolerance: Patient tolerated treatment well ?Patient left: in chair;with chair alarm set;with call bell/phone within reach ?Nurse Communication: Mobility status ?PT Visit Diagnosis: Other abnormalities of gait and mobility (R26.89);Muscle weakness (generalized) (M62.81);Hemiplegia and hemiparesis;Other symptoms and signs involving the nervous system (R29.898) ?Hemiplegia - Right/Left: Right ?Hemiplegia - dominant/non-dominant: Dominant ?Hemiplegia - caused by: Cerebral infarction ?  ? ? ?Time: 2202-5427 ?PT Time Calculation (min) (ACUTE ONLY): 23 min ? ?Charges:  $Therapeutic Activity: 23-37 mins          ?          ? ?Lyanne Co, PT  ?Acute Rehab Services ? Pager [phone] ?Office [phone] ? ? ? ?Kamaile Zachow L Tyreshia Ingman ?01/01/2022, 2:49 PM ? ?

## 2022-01-01 NOTE — CV Procedure (Signed)
INDICATIONS: ?Stroke ? ?PROCEDURE:  ? ?Informed consent was obtained prior to the procedure. The risks, benefits and alternatives for the procedure were discussed and the patient comprehended these risks.  Risks include, but are not limited to, cough, sore throat, vomiting, nausea, somnolence, esophageal and stomach trauma or perforation, bleeding, low blood pressure, aspiration, pneumonia, infection, trauma to the teeth and death.   ? ?After a procedural time-out, the oropharynx was anesthetized with 20% benzocaine spray.  ? ?During this procedure the patient was administered propofol achieve and maintain moderate conscious sedation.  The patient's heart rate, blood pressure, and oxygen saturationweare monitored continuously during the procedure. The period of conscious sedation was 20 minutes, of which I was present face-to-face 100% of this time. ? ?The transesophageal probe was inserted in the esophagus and stomach without difficulty and multiple views were obtained.  The patient was kept under observation until the patient left the procedure room.  The patient left the procedure room in stable condition.  ? ?Agitated microbubble saline contrast was administered. ? ?COMPLICATIONS:   ? ?There were no immediate complications. ? ?FINDINGS:  ?Normal LV/RV function ?No significant valve disease ?No PFO ?No LAA thrombus ? ?RECOMMENDATIONS:   ? ? Blood pressure management ? ?Time Spent Directly with the Patient: ? ?20 minutes  ? ?Phineas Inches E ?01/01/2022, 10:48 AM ? ?

## 2022-01-01 NOTE — Progress Notes (Addendum)
STROKE TEAM PROGRESS NOTE  ? ?INTERVAL HISTORY ?The patient is seen in her room this morning with no family at bedside. She appears more alert and interactive than yesterday. She is oriented to person, place, and time. Exam improved compared to yesterday with increasing R sided strength. Counseling given regarding abstinence from cocaine. TCD and TEE negative. To be moved out of ICU today.  ? ?Vitals:  ? 01/01/22 0600 01/01/22 0700 01/01/22 0800 01/01/22 1004  ?BP: (!) 157/77 (!) 137/103 (!) 158/96 (!) 204/83  ?Pulse: 85 86 86 88  ?Resp: 10 16 12 18   ?Temp:   98.2 ?F (36.8 ?C) 98 ?F (36.7 ?C)  ?TempSrc:   Oral Temporal  ?SpO2: 100% 100% 100% 100%  ?Weight:    69.4 kg  ?Height:    5\' 5"  (1.651 m)  ? ?CBC:  ?Recent Labs  ?Lab 12/29/21 ?1217 12/29/21 ?1241 12/31/21 ?1317  ?WBC 11.1*  --  12.4*  ?NEUTROABS 5.8  --  9.4*  ?HGB 10.3* 9.5* 11.5*  ?HCT 30.7* 28.0* 35.4*  ?MCV 78.1*  --  78.5*  ?PLT 222  --  241  ? ?Basic Metabolic Panel:  ?Recent Labs  ?Lab 12/31/21 ?1317 01/01/22 ?0413  ?NA 142 140  ?K 4.1 3.9  ?CL 114* 117*  ?CO2 20* 20*  ?GLUCOSE 106* 124*  ?BUN 18 20  ?CREATININE 1.61* 1.66*  ?CALCIUM 8.6* 8.2*  ? ?Lipid Panel:  ?Recent Labs  ?Lab 12/30/21 ?0305  ?CHOL 202*  ?TRIG 153*  ?HDL 54  ?CHOLHDL 3.7  ?VLDL 31  ?LDLCALC 117*  ? ?HgbA1c:  ?Recent Labs  ?Lab 12/29/21 ?1217  ?HGBA1C 7.1*  ? ?Urine Drug Screen:  ?Recent Labs  ?Lab 12/30/21 ?2138  ?LABOPIA NONE DETECTED  ?COCAINSCRNUR POSITIVE*  ?LABBENZ NONE DETECTED  ?AMPHETMU NONE DETECTED  ?THCU NONE DETECTED  ?LABBARB NONE DETECTED  ?  ?Alcohol Level No results for input(s): ETH in the last 168 hours. ? ?IMAGING past 24 hours ?EEG adult ? ?Result Date: 12/31/2021 ?Stacey Havens, MD     12/31/2021  2:52 PM Patient Name: Stacey Lucas MRN: [id number] Epilepsy Attending: Lora Lucas Referring Physician/Provider: Garvin Fila, MD Date: 12/31/2021 Duration: 21.53 mins Patient history: 46 year old female with acute left corona radiata/basal ganglia stroke.  EEG  evaluate for seizure. Level of alertness: Awake AEDs during EEG study: None Technical aspects: This EEG study was done with scalp electrodes positioned according to the 10-20 International system of electrode placement. Electrical activity was acquired at a sampling rate of 500Hz  and reviewed with a high frequency filter of 70Hz  and a low frequency filter of 1Hz . EEG data were recorded continuously and digitally stored. Description: The posterior dominant rhythm consists of 9 Hz activity of moderate voltage (25-35 uV) seen predominantly in posterior head regions, symmetric and reactive to eye opening and eye closing. Hyperventilation and photic stimulation were not performed.   IMPRESSION: This study is within normal limits. No seizures or epileptiform discharges were seen throughout the recording. Stacey Lucas  ? ?VAS [place] TRANSCRANIAL DOPPLER W BUBBLES ? ?Result Date: 12/31/2021 ? Transcranial Doppler with Bubble Patient Name:  Stacey Lucas  Date of Exam:   12/31/2021 Medical Rec #: [id number]     Accession #:    [id number] Date of Birth: [birth year]    Patient Gender: F Patient Age:   86 years Exam Location:  [facility] Procedure:      VAS [place] TRANSCRANIAL DOPPLER W BUBBLES Referring Phys: Stacey Lucas --------------------------------------------------------------------------------  Indications: Stroke. Comparison  Study: No priors. Performing Technologist: Stacey Lucas RDMS, RVT  Examination Guidelines: A complete evaluation includes B-mode imaging, spectral Doppler, color Doppler, and power Doppler as needed of all accessible portions of each vessel. Bilateral testing is considered an integral part of a complete examination. Limited examinations for reoccurring indications may be performed as noted.  Summary: No HITS at rest or during Valsalva. Negative transcranial Doppler Bubble study with no evidence of right to left intracardiac communication.  A vascular evaluation was performed. The right middle  cerebral artery was studied. An IV was inserted into the patient's left forearm. Verbal informed consent was obtained.  *See table(s) above for TCD measurements and observations.    Preliminary    ? ?PHYSICAL EXAM ? ?Physical Exam  ?Constitutional: Thin African-American female ?Cardiovascular: Normal rate and regular rhythm.  ?Respiratory: Effort normal, non-labored breathing ? ?Neuro: ?Mental Status: ?Patient is lethargic ?Speech is dysarthric ?No signs of aphasia or neglect ?Cranial Nerves: ?II: Visual Fields are full. Pupils are equal, round, and reactive to light.   ?III,IV, VI: EOMI without ptosis or diploplia.  ?V: Facial sensation is symmetric to temperature ?VII: Right facial droop ?VIII: Hearing is intact to voice ?X: Palate elevates symmetrically ?XI: Shoulder shrug is symmetric. ?XII: Tongue protrudes midline without atrophy or fasciculations.  ?Motor: ?Tone is normal. Bulk is normal. ?Chronic RLE weakness, unable to plantar or dorsiflex her right foot ?RUE 4/5  LUE 5/5 ?RLE 2/5   LLE 5/5    ?Right grip weakness. ?Sensory: ?Sensation is symmetric to light touch and temperature in the arms and legs. No extinction to DSS present.  ?Cerebellar: ?FNF intact bilaterally ? ? ? ?ASSESSMENT/PLAN ?[name] is a 46 y.o. 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 She had near resolution of original symptoms (left side weakness) and on the way to MRI developed right side weakness, dysarthria, and a facial droop. MRI was aborted and TNKase was given in ED. MRI today shows an acute infarct of the left corona radiata/basal ganglia.  DAPT therapy started 24 hours post TNK.  DVT prophylaxis with Lovenox. TCD and TEE negative.  ? 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 Small vessel disease. Atrophy. ASPECTS 10.    ?CTA head & neck Atherosclerotic irregularity of the M2 and more distal MCA vessels, bilaterally. Most notably, there is an apparent moderate/severe focal stenosis within a superior division mid M2 left MCA vessel. ?MRI  2.4 x 1.1 x 2.0 cm acute infarct within the left corona radiata/basal ganglia. ?2D Echo EF 70-75% with grade I diastolic dysfunction ?TCD and TEE negative ?LDL 117 ?HgbA1c 7.1 ?VTE prophylaxis - lovenox  ?   ?Diet  ? Diet NPO time specified  ? ?No antithrombotic prior to admission, now on aspirin 81 mg daily and clopidogrel 75 mg daily.  For 3 weeks and then aspirin alone. ?Therapy recommendations:  CIR ?Disposition:  pending ? ?Hypertension ?Home meds:  valsartan, toprol XL ?Stable ?Permissive hypertension (OK if < 220/120) but gradually normalize in 5-7 days ?Long-term BP goal normotensive ? ?Hyperlipidemia ?Home meds:  rosuvastatin 10mg  ?LDL 117, goal < 70 ?Increase to rosuvastatin 20mg  ?Continue statin at discharge ? ?Diabetes type II Controlled ?Home meds:  Insulin ?HgbA1c 7.1, goal < 7.0 ?CBGs ?Recent Labs  ?  12/31/21 ?1640 12/31/21 ?2130 01/01/22 ?0814  ?GLUCAP 166* 225* 170*  ?  ?SSI ? ?Other Stroke Risk Factors ?Hx stroke/TIA ?05/2021-right caudate infarct secondary to small vessel disease ?Cigarette smoker, advised to stop smoking ?Cocaine use ?Migraines ?Currently not on any medications ? ?Other Active Problems ?UTI on 3d Rocephin, Ucx greater than 100,000 colonies of group B strep sensitivity pending ?GERD ?Anxiety/depression ?Incidental thyroid nodule: follow up with PCP ?Noted during previous hospital stay ? ?Hospital day # 3 ? ? ?Corky Sox, MD ?PGY-1 ? ?I have personally obtained history,examined this patient, reviewed notes, independently viewed imaging studies, participated in medical  decision making and plan of care.ROS completed by me personally and pertinent positives fully documented  I have made any additions or clarifications directly to the above note. Agree with note above.

## 2022-01-01 NOTE — Anesthesia Postprocedure Evaluation (Signed)
Anesthesia Post Note ? ?Patient: Stacey Lucas ? ?Procedure(s) Performed: TRANSESOPHAGEAL ECHOCARDIOGRAM (TEE) ?BUBBLE STUDY ? ?  ? ?Patient location during evaluation: Endoscopy ?Anesthesia Type: MAC ?Level of consciousness: awake and alert ?Pain management: pain level controlled ?Vital Signs Assessment: post-procedure vital signs reviewed and stable ?Respiratory status: spontaneous breathing, nonlabored ventilation and respiratory function stable ?Cardiovascular status: stable and blood pressure returned to baseline ?Postop Assessment: no apparent nausea or vomiting ?Anesthetic complications: no ? ? ?No notable events documented. ? ?Last Vitals:  ?Vitals:  ? 01/01/22 1153 01/01/22 1200  ?BP:  137/73  ?Pulse:  77  ?Resp:  12  ?Temp: 36.8 ?C   ?SpO2:  100%  ?  ?Last Pain:  ?Vitals:  ? 01/01/22 1153  ?TempSrc: Oral  ?PainSc:   ? ? ?  ?  ?  ?  ?  ?  ? ?Melanye Hiraldo ? ? ? ? ?

## 2022-01-01 NOTE — Anesthesia Procedure Notes (Signed)
Procedure Name: [place] ?Date/Time: 01/01/2022 10:37 AM ?Performed by: Colin Benton, CRNA ?Pre-anesthesia Checklist: Patient identified, Emergency Drugs available, Suction available and Patient being monitored ?Patient Re-evaluated:Patient Re-evaluated prior to induction ?Oxygen Delivery Method: Nasal cannula ?Induction Type: IV induction ?Airway Equipment and Method: Bite block ?Placement Confirmation: positive ETCO2 ?Dental Injury: Teeth and Oropharynx as per pre-operative assessment  ? ? ? ? ?

## 2022-01-01 NOTE — Transfer of Care (Signed)
Immediate Anesthesia Transfer of Care Note ? ?Patient: Stacey Lucas ? ?Procedure(s) Performed: TRANSESOPHAGEAL ECHOCARDIOGRAM (TEE) ?BUBBLE STUDY ? ?Patient Location: PACU ? ?Anesthesia Type:MAC ? ?Level of Consciousness: awake, alert , oriented and patient cooperative ? ?Airway & Oxygen Therapy: Patient Spontanous Breathing and Patient connected to nasal cannula oxygen ? ?Post-op Assessment: Report given to RN and Post -op Vital signs reviewed and stable ? ?Post vital signs: Reviewed and stable ? ?Last Vitals:  ?Vitals Value Taken Time  ?BP 129/68 01/01/22 1055  ?Temp    ?Pulse 88 01/01/22 1057  ?Resp 19 01/01/22 1057  ?SpO2 100 % 01/01/22 1057  ?Vitals shown include unvalidated device data. ? ?Last Pain:  ?Vitals:  ? 01/01/22 1004  ?TempSrc: Temporal  ?PainSc:   ?   ? ?  ? ?Complications: No notable events documented. ?

## 2022-01-01 NOTE — Progress Notes (Signed)
?  Echocardiogram ?Echocardiogram Transesophageal has been performed. ? ?Stacey Lucas ?01/01/2022, 11:27 AM ?

## 2022-01-01 NOTE — Anesthesia Preprocedure Evaluation (Signed)
Anesthesia Evaluation  ?Patient identified by MRN, date of birth, ID band ?Patient awake ? ? ? ?Reviewed: ?Allergy & Precautions, NPO status , Patient's Chart, lab work & pertinent test results ? ?History of Anesthesia Complications ?Negative for: history of anesthetic complications ? ?Airway ?Mallampati: II ? ?TM Distance: >3 FB ? ? ? ? Dental ? ?(+) Edentulous Upper, Upper Dentures, Dental Advisory Given ?  ?Pulmonary ?neg shortness of breath, neg COPD, neg recent URI, Current Smoker and Patient abstained from smoking.,  ?  ?breath sounds clear to auscultation ? ? ? ? ? ? Cardiovascular ?hypertension, Pt. on medications and Pt. on home beta blockers ?(-) angina(-) Past MI and (-) CHF  ?Rhythm:Regular  ?1. Left ventricular ejection fraction, by estimation, is 70 to 75%. Left  ?ventricular ejection fraction by PLAX is 71 %. The left ventricle has  ?hyperdynamic function. The left ventricle has no regional wall motion  ?abnormalities. There is mild left  ?ventricular hypertrophy. Left ventricular diastolic parameters are  ?consistent with Grade I diastolic dysfunction (impaired relaxation).  ??2. Right ventricular systolic function is normal. The right ventricular  ?size is normal. Tricuspid regurgitation signal is inadequate for assessing  ?PA pressure.  ??3. The mitral valve is grossly normal. No evidence of mitral valve  ?regurgitation.  ??4. The aortic valve is tricuspid. Aortic valve regurgitation is not  ?visualized.  ??5. The inferior vena cava is normal in size with <50% respiratory  ?variability, suggesting right atrial pressure of 8 mmHg.  ?  ?Neuro/Psych ? Headaches, PSYCHIATRIC DISORDERS Anxiety Depression  Neuromuscular disease CVA, Residual Symptoms   ? GI/Hepatic ?Neg liver ROS, GERD  ,  ?Endo/Other  ?diabetesLab Results ?     Component                Value               Date                 ?     HGBA1C                   7.1 (H)             12/29/2021           ? ?  Renal/GU ?Renal Insufficiency, ARF and ESRFRenal diseaseLab Results ?     Component                Value               Date                 ?     CREATININE               1.66 (H)            01/01/2022           ?  ? ?  ?Musculoskeletal ? ?(+) Arthritis ,  ? Abdominal ?  ?Peds ? Hematology ?  ?Anesthesia Other Findings ? ? Reproductive/Obstetrics ? ?  ? ? ? ? ? ? ? ? ? ? ? ? ? ?  ?  ? ? ? ? ? ? ? ? ?Anesthesia Physical ?Anesthesia Plan ? ?ASA: 3 ? ?Anesthesia Plan: MAC  ? ?Post-op Pain Management: Minimal or no pain anticipated  ? ?Induction: Intravenous ? ?PONV Risk Score and Plan: 1 and Treatment may vary due to age or medical condition and Propofol infusion ? ?Airway Management Planned: Nasal  Cannula, Simple Face Mask and Natural Airway ? ?Additional Equipment: None ? ?Intra-op Plan:  ? ?Post-operative Plan:  ? ?Informed Consent: I have reviewed the patients History and Physical, chart, labs and discussed the procedure including the risks, benefits and alternatives for the proposed anesthesia with the patient or authorized representative who has indicated his/her understanding and acceptance.  ? ? ? ?Dental advisory given ? ?Plan Discussed with: CRNA ? ?Anesthesia Plan Comments:   ? ? ? ? ? ? ?Anesthesia Quick Evaluation ? ?

## 2022-01-01 NOTE — Progress Notes (Addendum)
Inpatient Rehabilitation Admissions Coordinator  ? ?I met with patient at bedside with a cousin and Aunt. Patient alert and talkative. I then contacted her daughter by phone to discuss possible Cir admit. Daughter to come today to see her Mom and then she will call me back to further discuss her rehab options. I will begin Auth for possible CIR admit after therapy updates today. ? ?Danne Baxter, RN, MSN ?Rehab Admissions Coordinator ?[phone] ?01/01/2022 12:24 PM ? ?

## 2022-01-01 NOTE — Interval H&P Note (Signed)
History and Physical Interval Note: ? ?01/01/2022 ?10:19 AM ? ?Stacey Lucas  has presented today for surgery, with the diagnosis of STROKE.  The various methods of treatment have been discussed with the patient and family. After consideration of risks, benefits and other options for treatment, the patient has consented to  Procedure(s): ?TRANSESOPHAGEAL ECHOCARDIOGRAM (TEE) (N/A) as a surgical intervention.  The patient's history has been reviewed, patient examined, no change in status, stable for surgery.  I have reviewed the patient's chart and labs.  Questions were answered to the patient's satisfaction.   ? ? ?Carolan Clines E ? ? ?

## 2022-01-01 NOTE — Progress Notes (Signed)
Informed consent for TEE completed and placed in shadow chart.  ?

## 2022-01-02 ENCOUNTER — Encounter (HOSPITAL_COMMUNITY): Payer: Self-pay | Admitting: Internal Medicine

## 2022-01-02 ENCOUNTER — Other Ambulatory Visit: Payer: Self-pay

## 2022-01-02 ENCOUNTER — Inpatient Hospital Stay (HOSPITAL_COMMUNITY)
Admission: RE | Admit: 2022-01-02 | Discharge: 2022-01-17 | DRG: 057 | Disposition: A | Discharge status: Home / Self Care | Payer: BC Managed Care – PPO | Source: Intra-hospital | Attending: Physical Medicine & Rehabilitation | Admitting: Physical Medicine & Rehabilitation

## 2022-01-02 DIAGNOSIS — Z8261 Family history of arthritis: Secondary | ICD-10-CM | POA: Diagnosis not present

## 2022-01-02 DIAGNOSIS — I10 Essential (primary) hypertension: Secondary | ICD-10-CM | POA: Diagnosis not present

## 2022-01-02 DIAGNOSIS — E785 Hyperlipidemia, unspecified: Secondary | ICD-10-CM | POA: Diagnosis present

## 2022-01-02 DIAGNOSIS — F419 Anxiety disorder, unspecified: Secondary | ICD-10-CM | POA: Diagnosis present

## 2022-01-02 DIAGNOSIS — N179 Acute kidney failure, unspecified: Secondary | ICD-10-CM

## 2022-01-02 DIAGNOSIS — E1122 Type 2 diabetes mellitus with diabetic chronic kidney disease: Secondary | ICD-10-CM | POA: Diagnosis present

## 2022-01-02 DIAGNOSIS — E663 Overweight: Secondary | ICD-10-CM | POA: Diagnosis present

## 2022-01-02 DIAGNOSIS — M1711 Unilateral primary osteoarthritis, right knee: Secondary | ICD-10-CM | POA: Diagnosis present

## 2022-01-02 DIAGNOSIS — E669 Obesity, unspecified: Secondary | ICD-10-CM | POA: Diagnosis not present

## 2022-01-02 DIAGNOSIS — I69392 Facial weakness following cerebral infarction: Secondary | ICD-10-CM | POA: Diagnosis not present

## 2022-01-02 DIAGNOSIS — I69319 Unspecified symptoms and signs involving cognitive functions following cerebral infarction: Secondary | ICD-10-CM

## 2022-01-02 DIAGNOSIS — M545 Low back pain, unspecified: Secondary | ICD-10-CM | POA: Diagnosis present

## 2022-01-02 DIAGNOSIS — D649 Anemia, unspecified: Secondary | ICD-10-CM | POA: Diagnosis not present

## 2022-01-02 DIAGNOSIS — F32A Depression, unspecified: Secondary | ICD-10-CM | POA: Diagnosis present

## 2022-01-02 DIAGNOSIS — I69351 Hemiplegia and hemiparesis following cerebral infarction affecting right dominant side: Secondary | ICD-10-CM | POA: Diagnosis present

## 2022-01-02 DIAGNOSIS — F17210 Nicotine dependence, cigarettes, uncomplicated: Secondary | ICD-10-CM | POA: Diagnosis present

## 2022-01-02 DIAGNOSIS — I639 Cerebral infarction, unspecified: Secondary | ICD-10-CM | POA: Diagnosis present

## 2022-01-02 DIAGNOSIS — N289 Disorder of kidney and ureter, unspecified: Secondary | ICD-10-CM | POA: Diagnosis not present

## 2022-01-02 DIAGNOSIS — I129 Hypertensive chronic kidney disease with stage 1 through stage 4 chronic kidney disease, or unspecified chronic kidney disease: Secondary | ICD-10-CM | POA: Diagnosis present

## 2022-01-02 DIAGNOSIS — Z8249 Family history of ischemic heart disease and other diseases of the circulatory system: Secondary | ICD-10-CM | POA: Diagnosis not present

## 2022-01-02 DIAGNOSIS — E119 Type 2 diabetes mellitus without complications: Secondary | ICD-10-CM | POA: Diagnosis not present

## 2022-01-02 DIAGNOSIS — N189 Chronic kidney disease, unspecified: Secondary | ICD-10-CM | POA: Diagnosis present

## 2022-01-02 DIAGNOSIS — Z833 Family history of diabetes mellitus: Secondary | ICD-10-CM

## 2022-01-02 DIAGNOSIS — Z6825 Body mass index (BMI) 25.0-25.9, adult: Secondary | ICD-10-CM | POA: Diagnosis not present

## 2022-01-02 DIAGNOSIS — Z7902 Long term (current) use of antithrombotics/antiplatelets: Secondary | ICD-10-CM

## 2022-01-02 DIAGNOSIS — Z79899 Other long term (current) drug therapy: Secondary | ICD-10-CM

## 2022-01-02 DIAGNOSIS — E1169 Type 2 diabetes mellitus with other specified complication: Secondary | ICD-10-CM | POA: Diagnosis not present

## 2022-01-02 DIAGNOSIS — K219 Gastro-esophageal reflux disease without esophagitis: Secondary | ICD-10-CM | POA: Diagnosis present

## 2022-01-02 DIAGNOSIS — I63411 Cerebral infarction due to embolism of right middle cerebral artery: Secondary | ICD-10-CM

## 2022-01-02 DIAGNOSIS — E1140 Type 2 diabetes mellitus with diabetic neuropathy, unspecified: Secondary | ICD-10-CM | POA: Diagnosis present

## 2022-01-02 DIAGNOSIS — E041 Nontoxic single thyroid nodule: Secondary | ICD-10-CM | POA: Diagnosis present

## 2022-01-02 DIAGNOSIS — M2351 Chronic instability of knee, right knee: Secondary | ICD-10-CM | POA: Diagnosis present

## 2022-01-02 DIAGNOSIS — I6300 Cerebral infarction due to thrombosis of unspecified precerebral artery: Secondary | ICD-10-CM | POA: Diagnosis not present

## 2022-01-02 DIAGNOSIS — I6330 Cerebral infarction due to thrombosis of unspecified cerebral artery: Secondary | ICD-10-CM

## 2022-01-02 DIAGNOSIS — F5105 Insomnia due to other mental disorder: Secondary | ICD-10-CM | POA: Diagnosis present

## 2022-01-02 LAB — GLUCOSE, CAPILLARY
Glucose-Capillary: 147 mg/dL — ABNORMAL HIGH (ref 70–99)
Glucose-Capillary: 235 mg/dL — ABNORMAL HIGH (ref 70–99)
Glucose-Capillary: 129 mg/dL — ABNORMAL HIGH (ref 70–99)
Glucose-Capillary: 169 mg/dL — ABNORMAL HIGH (ref 70–99)

## 2022-01-02 MED ORDER — PROCHLORPERAZINE EDISYLATE 10 MG/2ML IJ SOLN: 5.0000 mg | Freq: Four times a day (QID) | INTRAMUSCULAR | Status: DC | PRN

## 2022-01-02 MED ORDER — TRAZODONE HCL 50 MG PO TABS
25.0000 mg | ORAL_TABLET | Freq: Every evening | ORAL | Status: DC | PRN
  Administered 2022-01-13 – 2022-01-16 (×4): 50 mg via ORAL
  Filled 2022-01-02 (×4): qty 1

## 2022-01-02 MED ORDER — ALPRAZOLAM 0.25 MG PO TABS: 0.2500 mg | ORAL_TABLET | Freq: Three times a day (TID) | ORAL | Status: DC | PRN

## 2022-01-02 MED ORDER — CITALOPRAM HYDROBROMIDE 10 MG PO TABS
10.0000 mg | ORAL_TABLET | Freq: Every day | ORAL | Status: DC
  Administered 2022-01-03 – 2022-01-17 (×15): 10 mg via ORAL
  Filled 2022-01-02 (×15): qty 1

## 2022-01-02 MED ORDER — PROCHLORPERAZINE MALEATE 5 MG PO TABS: 5.0000 mg | ORAL_TABLET | Freq: Four times a day (QID) | ORAL | Status: DC | PRN

## 2022-01-02 MED ORDER — GUAIFENESIN-DM 100-10 MG/5ML PO SYRP: 5.0000 mL | ORAL_SOLUTION | Freq: Four times a day (QID) | ORAL | Status: DC | PRN

## 2022-01-02 MED ORDER — POLYETHYLENE GLYCOL 3350 17 G PO PACK
17.0000 g | PACK | Freq: Every day | ORAL | Status: DC | PRN
Start: 2022-01-02 — End: 2022-01-17
  Administered 2022-01-06: 17 g via ORAL
  Filled 2022-01-02: qty 1

## 2022-01-02 MED ORDER — INSULIN GLARGINE-YFGN 100 UNIT/ML ~~LOC~~ SOLN
5.0000 [IU] | Freq: Every day | SUBCUTANEOUS | Status: DC
Start: 2022-01-02 — End: 2022-01-17
  Administered 2022-01-02 – 2022-01-16 (×15): 5 [IU] via SUBCUTANEOUS
  Filled 2022-01-02 (×16): qty 0.05

## 2022-01-02 MED ORDER — INSULIN ASPART 100 UNIT/ML IJ SOLN: 0.0000 [IU] | Freq: Three times a day (TID) | INTRAMUSCULAR | Status: DC

## 2022-01-02 MED ORDER — INSULIN ASPART 100 UNIT/ML IJ SOLN
0.0000 [IU] | Freq: Every day | INTRAMUSCULAR | Status: DC
  Administered 2022-01-03: 2 [IU] via SUBCUTANEOUS
  Administered 2022-01-05: 4 [IU] via SUBCUTANEOUS
  Administered 2022-01-06: 3 [IU] via SUBCUTANEOUS
  Administered 2022-01-08: 0 [IU] via SUBCUTANEOUS
  Administered 2022-01-09 – 2022-01-10 (×2): 3 [IU] via SUBCUTANEOUS
  Administered 2022-01-11: 2 [IU] via SUBCUTANEOUS
  Administered 2022-01-14 – 2022-01-15 (×2): 3 [IU] via SUBCUTANEOUS

## 2022-01-02 MED ORDER — ENOXAPARIN SODIUM 40 MG/0.4ML IJ SOSY
40.0000 mg | PREFILLED_SYRINGE | INTRAMUSCULAR | Status: DC
  Administered 2022-01-03 – 2022-01-16 (×14): 40 mg via SUBCUTANEOUS
  Filled 2022-01-02 (×14): qty 0.4

## 2022-01-02 MED ORDER — BISACODYL 10 MG RE SUPP
10.0000 mg | Freq: Every day | RECTAL | Status: DC | PRN
  Administered 2022-01-06: 10 mg via RECTAL
  Filled 2022-01-02: qty 1

## 2022-01-02 MED ORDER — INSULIN ASPART 100 UNIT/ML IJ SOLN
0.0000 [IU] | Freq: Three times a day (TID) | INTRAMUSCULAR | 11 refills | Status: DC
Start: 2022-01-02 — End: 2022-01-17

## 2022-01-02 MED ORDER — PANTOPRAZOLE SODIUM 40 MG PO TBEC
40.0000 mg | DELAYED_RELEASE_TABLET | Freq: Every day | ORAL | Status: DC
  Administered 2022-01-02 – 2022-01-16 (×15): 40 mg via ORAL
  Filled 2022-01-02 (×16): qty 1

## 2022-01-02 MED ORDER — IRBESARTAN 75 MG PO TABS
150.0000 mg | ORAL_TABLET | Freq: Every day | ORAL | Status: DC
  Administered 2022-01-03 – 2022-01-13 (×11): 150 mg via ORAL
  Filled 2022-01-02 (×12): qty 2

## 2022-01-02 MED ORDER — ACETAMINOPHEN 325 MG PO TABS
325.0000 mg | ORAL_TABLET | ORAL | Status: DC | PRN
  Administered 2022-01-04 – 2022-01-16 (×13): 650 mg via ORAL
  Filled 2022-01-02 (×13): qty 2

## 2022-01-02 MED ORDER — METOPROLOL SUCCINATE ER 25 MG PO TB24
25.0000 mg | ORAL_TABLET | Freq: Every day | ORAL | Status: DC
  Administered 2022-01-03 – 2022-01-04 (×2): 25 mg via ORAL
  Filled 2022-01-02: qty 1

## 2022-01-02 MED ORDER — CITALOPRAM HYDROBROMIDE 10 MG PO TABS: 10.0000 mg | ORAL_TABLET | Freq: Every day | ORAL | 0 refills | Status: DC

## 2022-01-02 MED ORDER — CLOPIDOGREL BISULFATE 75 MG PO TABS
75.0000 mg | ORAL_TABLET | Freq: Every day | ORAL | Status: DC
  Administered 2022-01-03 – 2022-01-17 (×15): 75 mg via ORAL
  Filled 2022-01-02 (×15): qty 1

## 2022-01-02 MED ORDER — DIPHENHYDRAMINE HCL 12.5 MG/5ML PO ELIX: 12.5000 mg | ORAL_SOLUTION | Freq: Four times a day (QID) | ORAL | Status: DC | PRN

## 2022-01-02 MED ORDER — INSULIN ASPART 100 UNIT/ML IJ SOLN
0.0000 [IU] | Freq: Three times a day (TID) | INTRAMUSCULAR | Status: DC
  Administered 2022-01-02 – 2022-01-05 (×5): 1 [IU] via SUBCUTANEOUS
  Administered 2022-01-05: 2 [IU] via SUBCUTANEOUS
  Administered 2022-01-06: 1 [IU] via SUBCUTANEOUS
  Administered 2022-01-06: 5 [IU] via SUBCUTANEOUS
  Administered 2022-01-06: 1 [IU] via SUBCUTANEOUS
  Administered 2022-01-07: 2 [IU] via SUBCUTANEOUS
  Administered 2022-01-07: 1 [IU] via SUBCUTANEOUS
  Administered 2022-01-08: 2 [IU] via SUBCUTANEOUS
  Administered 2022-01-08: 5 [IU] via SUBCUTANEOUS
  Administered 2022-01-09: 2 [IU] via SUBCUTANEOUS
  Administered 2022-01-09: 5 [IU] via SUBCUTANEOUS
  Administered 2022-01-09 – 2022-01-11 (×3): 2 [IU] via SUBCUTANEOUS
  Administered 2022-01-11 – 2022-01-12 (×3): 1 [IU] via SUBCUTANEOUS
  Administered 2022-01-13: 2 [IU] via SUBCUTANEOUS
  Administered 2022-01-14 (×2): 1 [IU] via SUBCUTANEOUS
  Administered 2022-01-15 (×2): 2 [IU] via SUBCUTANEOUS
  Administered 2022-01-16: 1 [IU] via SUBCUTANEOUS
  Administered 2022-01-16: 2 [IU] via SUBCUTANEOUS
  Administered 2022-01-17: 1 [IU] via SUBCUTANEOUS

## 2022-01-02 MED ORDER — INSULIN ASPART 100 UNIT/ML IJ SOLN: 0.0000 [IU] | Freq: Every day | INTRAMUSCULAR | 11 refills | Status: DC

## 2022-01-02 MED ORDER — SODIUM CHLORIDE 0.9 % IV SOLN
1.0000 g | Freq: Once | INTRAVENOUS | Status: AC
  Administered 2022-01-02: 1 g via INTRAVENOUS
  Filled 2022-01-02: qty 10

## 2022-01-02 MED ORDER — ROSUVASTATIN CALCIUM 20 MG PO TABS
20.0000 mg | ORAL_TABLET | Freq: Every day | ORAL | Status: DC
Start: 2022-01-03 — End: 2022-01-16
  Administered 2022-01-03 – 2022-01-16 (×14): 20 mg via ORAL
  Filled 2022-01-02 (×14): qty 1

## 2022-01-02 MED ORDER — ALUM & MAG HYDROXIDE-SIMETH 200-200-20 MG/5ML PO SUSP: 30.0000 mL | ORAL | Status: DC | PRN

## 2022-01-02 MED ORDER — ASPIRIN EC 81 MG PO TBEC
81.0000 mg | DELAYED_RELEASE_TABLET | Freq: Every day | ORAL | Status: DC
  Administered 2022-01-03 – 2022-01-17 (×15): 81 mg via ORAL
  Filled 2022-01-02 (×15): qty 1

## 2022-01-02 MED ORDER — FLEET ENEMA 7-19 GM/118ML RE ENEM: 1.0000 | ENEMA | Freq: Once | RECTAL | Status: DC | PRN

## 2022-01-02 MED ORDER — ASPIRIN 81 MG PO TBEC: 81.0000 mg | DELAYED_RELEASE_TABLET | Freq: Every day | ORAL | 11 refills | Status: DC

## 2022-01-02 MED ORDER — INSULIN ASPART 100 UNIT/ML IJ SOLN: 0.0000 [IU] | Freq: Every day | INTRAMUSCULAR | Status: DC

## 2022-01-02 MED ORDER — PROCHLORPERAZINE 25 MG RE SUPP
12.5000 mg | Freq: Four times a day (QID) | RECTAL | Status: DC | PRN
Start: 2022-01-02 — End: 2022-01-17

## 2022-01-02 MED ORDER — DICLOFENAC SODIUM 1 % EX GEL
4.0000 g | Freq: Every day | CUTANEOUS | Status: DC | PRN
  Filled 2022-01-02: qty 100

## 2022-01-02 MED ORDER — ROSUVASTATIN CALCIUM 20 MG PO TABS
20.0000 mg | ORAL_TABLET | Freq: Every day | ORAL | 0 refills | Status: DC
Start: 2022-01-03 — End: 2022-01-15

## 2022-01-02 NOTE — Plan of Care (Signed)
  Problem: Education: Goal: Knowledge of General Education information will improve Description: Including pain rating scale, medication(s)/side effects and non-pharmacologic comfort measures Outcome: Progressing   Problem: Clinical Measurements: Goal: Ability to maintain clinical measurements within normal limits will improve Outcome: Progressing   Problem: Activity: Goal: Risk for activity intolerance will decrease Outcome: Progressing   Problem: Nutrition: Goal: Adequate nutrition will be maintained Outcome: Progressing   Problem: Elimination: Goal: Will not experience complications related to bowel motility Outcome: Progressing   

## 2022-01-02 NOTE — Progress Notes (Signed)
STROKE TEAM PROGRESS NOTE  ? ?INTERVAL HISTORY ?The patient is seen in her room this morning with no family at bedside. She remains   alert and interactive and has persistent right hemiparesis..  Therapist recommend inpatient rehab and awaiting availability of bed for transfer out of ICU and to rehab.  ? ?Vitals:  ? 01/02/22 0900 01/02/22 1000 01/02/22 1100 01/02/22 1200  ?BP: (!) 158/69 (!) 163/85 (!) 122/93   ?Pulse: 80 81 73   ?Resp: 17 13 14    ?Temp:    98.1 ?F (36.7 ?C)  ?TempSrc:    Oral  ?SpO2: 100% 100% 100%   ?Weight:      ?Height:      ? ?CBC:  ?Recent Labs  ?Lab 12/29/21 ?1217 12/29/21 ?1241 12/31/21 ?1317  ?WBC 11.1*  --  12.4*  ?NEUTROABS 5.8  --  9.4*  ?HGB 10.3* 9.5* 11.5*  ?HCT 30.7* 28.0* 35.4*  ?MCV 78.1*  --  78.5*  ?PLT 222  --  241  ? ?Basic Metabolic Panel:  ?Recent Labs  ?Lab 12/31/21 ?1317 01/01/22 ?0413  ?NA 142 140  ?K 4.1 3.9  ?CL 114* 117*  ?CO2 20* 20*  ?GLUCOSE 106* 124*  ?BUN 18 20  ?CREATININE 1.61* 1.66*  ?CALCIUM 8.6* 8.2*  ? ?Lipid Panel:  ?Recent Labs  ?Lab 12/30/21 ?0305  ?CHOL 202*  ?TRIG 153*  ?HDL 54  ?CHOLHDL 3.7  ?VLDL 31  ?LDLCALC 117*  ? ?HgbA1c:  ?Recent Labs  ?Lab 12/29/21 ?1217  ?HGBA1C 7.1*  ? ?Urine Drug Screen:  ?Recent Labs  ?Lab 12/30/21 ?2138  ?LABOPIA NONE DETECTED  ?COCAINSCRNUR POSITIVE*  ?LABBENZ NONE DETECTED  ?AMPHETMU NONE DETECTED  ?THCU NONE DETECTED  ?LABBARB NONE DETECTED  ?  ?Alcohol Level No results for input(s): ETH in the last 168 hours. ? ?IMAGING past 24 hours ?No results found. ? ?PHYSICAL EXAM ? ?Physical Exam  ?Constitutional: Thin African-American female ?Cardiovascular: Normal rate and regular rhythm.  ?Respiratory: Effort normal, non-labored breathing ? ?Neuro: ?Mental Status: ?Patient is lethargic ?Speech is dysarthric ?No signs of aphasia or neglect ?Cranial Nerves: ?II: Visual Fields are full. Pupils are equal, round, and reactive to light.   ?III,IV, VI: EOMI without ptosis or diploplia.  ?V: Facial sensation is symmetric to  temperature ?VII: Right facial droop ?VIII: Hearing is intact to voice ?X: Palate elevates symmetrically ?XI: Shoulder shrug is symmetric. ?XII: Tongue protrudes midline without atrophy or fasciculations.  ?Motor: ?Tone is normal. Bulk is normal. ?Chronic RLE weakness, unable to plantar or dorsiflex her right foot ?RUE 4/5  LUE 5/5 ?RLE 2/5   LLE 5/5    ?Right grip weakness. ?Sensory: ?Sensation is symmetric to light touch and temperature in the arms and legs. No extinction to DSS present.  ?Cerebellar: ?FNF intact bilaterally ? ? ? ?ASSESSMENT/PLAN ?[name] is a 46 y.o. 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 She had near resolution of original symptoms (left side weakness) and on the way to MRI developed right side weakness, dysarthria, and a facial droop. MRI was aborted and TNKase was given in ED. MRI today shows an acute infarct of the left corona radiata/basal ganglia.  DAPT therapy started 24 hours post TNK.  DVT prophylaxis with Lovenox. TCD and TEE negative.  ? 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 Small vessel disease. Atrophy. ASPECTS 10.    ?CTA head & neck Atherosclerotic irregularity of the M2 and more distal MCA vessels, bilaterally. Most notably, there is an apparent moderate/severe focal stenosis within a superior division mid M2 left MCA vessel. ?MRI  2.4 x 1.1 x 2.0 cm acute infarct within the left corona radiata/basal ganglia. ?2D Echo EF 70-75% with grade I diastolic dysfunction ?TCD and TEE negative ?LDL 117 ?HgbA1c 7.1 ?VTE prophylaxis - lovenox  ?   ?Diet  ? Diet heart healthy/carb  modified Room service appropriate? Yes with Assist; Fluid consistency: Thin  ? ?No antithrombotic prior to admission, now on aspirin 81 mg daily and clopidogrel 75 mg daily.  For 3 weeks and then aspirin alone. ?Therapy recommendations:  CIR ?Disposition:  pending ? ?Hypertension ?Home meds:  valsartan, toprol XL ?Stable ?Permissive hypertension (OK if < 220/120) but gradually normalize in 5-7 days ?Long-term BP goal normotensive ? ?Hyperlipidemia ?Home meds:  rosuvastatin 10mg  ?LDL 117, goal < 70 ?Increase to rosuvastatin 20mg  ?Continue statin at discharge ? ?Diabetes type II Controlled ?Home meds:  Insulin ?HgbA1c 7.1, goal < 7.0 ?CBGs ?Recent Labs  ?  01/01/22 ?2206 01/02/22 ?0809 01/02/22 ?1115  ?GLUCAP 206* 147* 235*  ?  ?SSI ? ?Other Stroke Risk Factors ?Hx stroke/TIA ?05/2021-right caudate infarct secondary to small vessel disease ?Cigarette smoker, advised to stop smoking ?Cocaine use ?Migraines ?Currently not on any medications ? ?Other Active Problems ?UTI on 3d Rocephin, Ucx greater than 100,000 colonies of group B strep sensitivity pending ?GERD ?Anxiety/depression ?Incidental thyroid nodule: follow up with PCP ?Noted during previous hospital stay ? ?Hospital day # 4 ? ? Continue Rocephin for UTI.  Transfer to rehab when bed available..Patient counseled to quit cocaine.  Discussed with rehab coordinator ? ?  ? ? ?Antony Contras, MD ?Medical Director ?[facility] ?Pager: [phone] ?01/02/2022 1:12 PM ? ? ?To contact Stroke Continuity provider, please refer to [email or web address] ?After hours, contact General Neurology ? ?

## 2022-01-02 NOTE — TOC CAGE-AID Note (Signed)
Transition of Care (TOC) - CAGE-AID Screening ? ? ?Patient Details  ?Name: Stacey Lucas ?MRN: [id number] ?Date of Birth: 1976-03-01 ? ?Transition of Care (TOC) CM/SW Contact:    ?Aggie Douse C Tarpley-Carter, LCSWA ?Phone Number: ?01/02/2022, 1:28 PM ? ? ?Clinical Narrative: ?Pt participated in [facility].  Pt stated she does use substance and ETOH socially.  Pt was offered resources, due to usage of substance and ETOH.    ? ?Passenger transport manager, MSW, LCSW-A ?Pronouns:  She/Her/Hers ?[facility] ?Clinical Social Worker ?Direct Number:  [phone] ?[email or web address] ? ?CAGE-AID Screening: ?Substance Abuse Screening unable to be completed due to: : Patient unable to participate ? ?Have You Ever Felt You Ought to Cut Down on Your Drinking or Drug Use?: Yes ?Have People Annoyed You By Critizing Your Drinking Or Drug Use?: No ?Have You Felt Bad Or Guilty About Your Drinking Or Drug Use?: No ?Have You Ever Had a Drink or Used Drugs First Thing In The Morning to Steady Your Nerves or to Get Rid of a Hangover?: No ?CAGE-AID Score: 1 ? ?Substance Abuse Education Offered: Yes ? ?Substance abuse interventions: Educational Materials ? ? ? ? ? ? ?

## 2022-01-02 NOTE — H&P (Shared)
? ? ?Physical Medicine and Rehabilitation Admission H&P ? ?  ?Chief Complaint  ?Patient presents with  ? Functional deficits due to Stroke  ? ? ?HPI: Stacey Lucas is a 46 year old female with history of uncontrolled T2DM with neuropathy/RLE weakness, CVA 09/22, thyroid nodule, depression, ongoing cocaine abuse who was admitted on 12/29/21 with left sided weakness, left facial weakness and slurred speech which resolved transiently but then developed near flaccidity of RUE, RLE>LLE drift, slurred speech and right facial droop while in ED. UDS positive for cocaine. CTA head/neck was negative for LVO, showed moderate to severe focal stenosis in mid left M2 MCA vessel as well as right thyroid nodule. Elevated BP treated with IV labetalol/clevidipine and she received TNK.   Follow up MRI brain revealed acute infarct in left corona radiata and basal ganglia with chronic small vessel infarcts in bilateral CR/BG.  2D echo showed EF 70-75% with mild LVH and follow up TEE showed EF 60-65% with negative bubble study, no intra-atrial shunt or cardiac thrombus.  ?  ?EEG negative for seizures. Hospital course significant for Acute on chronic renal failure with rise in SCr to 1.66 as well as leucocytosis due to strep agalactiae UTI. She was started on ceftriaxone X 3 doses today.  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 Leonie Man recommended DAPT X 3 weeks followed by ASA alone. She has also been educated on cocaine and tobacco cessation. To follow up with PCP for evaluation of thyroid nodule. She continues to be limited by right sided weakness with left inattention (from prior stroke?), right lean as well as right knee instability with standing attempts. CIR recommended due to functional decline.  ? ? ?Review of Systems  ?Constitutional:  Negative for chills and fever.  ?HENT:  Negative for hearing loss and tinnitus.   ?Eyes:  Negative for blurred vision and double vision.  ?Respiratory:  Negative for cough and  shortness of breath.   ?Cardiovascular:  Negative for chest pain and leg swelling.  ?Gastrointestinal:  Negative for abdominal pain, constipation and heartburn.  ?Genitourinary:  Negative for dysuria and urgency.  ?Musculoskeletal:  Positive for back pain and joint pain (right knee).  ?Skin:  Negative for rash.  ?Neurological:  Positive for sensory change (numbness left foot> right foot), speech change, weakness and headaches. Negative for dizziness.  ?Psychiatric/Behavioral:  The patient is nervous/anxious and has insomnia.   ? ? ?Past Medical History:  ?Diagnosis Date  ? Anxiety and depression   ? Cocaine abuse ([place])   ? Depression with suicidal ideation   ? [facility] admission 04/2018  ? Diabetes mellitus without complication ([place])   ? Type II  ? GERD (gastroesophageal reflux disease)   ? Hyperlipidemia   ? Hypertension   ? Impingement syndrome of right shoulder   ? Migraine headache   ? Osteoarthritis of right knee 05/24/2021  ? Pilonidal abscess 05/2013  ? Pyelonephritis   ? Right thyroid nodule   ? Sciatica   ? ? ?Past Surgical History:  ?Procedure Laterality Date  ? BUBBLE STUDY  01/01/2022  ? Procedure: BUBBLE STUDY;  Surgeon: Janina Mayo, MD;  Location: [facility];  Service: Cardiovascular;;  ? TEE WITHOUT CARDIOVERSION N/A 01/01/2022  ? Procedure: TRANSESOPHAGEAL ECHOCARDIOGRAM (TEE);  Surgeon: Janina Mayo, MD;  Location: [facility];  Service: Cardiovascular;  Laterality: N/A;  ? tubal    ? ? ?Family History  ?Problem Relation Age of Onset  ? Diabetes Mother   ? Hypertension Mother   ?  Arthritis Mother   ? Diabetes Father   ? Hypertension Father   ? ? ?Social History: Divorced. Works as Secretary/administrator at [employer]. She reports that she has been smoking cigarettes. She has been smoking an average of 1 pack per day. She has never used smokeless tobacco. She reports current alcohol use--6 pack/week. She reports cocaine bout once a week.  ? ? ?Allergies: No Known Allergies ? ? ?Medications Prior to Admission   ?Medication Sig Dispense Refill  ? azelastine (ASTELIN) 0.1 % nasal spray Place 1-2 sprays into both nostrils 2 (two) times daily as needed for allergies.    ? diclofenac Sodium (VOLTAREN) 1 % GEL Apply 4 g topically daily as needed (For knee pain).    ? metoprolol succinate (TOPROL-XL) 25 MG 24 hr tablet Take 25 mg by mouth daily.    ? rosuvastatin (CRESTOR) 10 MG tablet Take 10 mg by mouth at bedtime.    ? valsartan (DIOVAN) 160 MG tablet Take 160 mg by mouth daily.    ? blood glucose meter kit and supplies KIT Check fasting blood sugar in the morning. 1 each 0  ? clopidogrel (PLAVIX) 75 MG tablet Take 1 tablet (75 mg total) by mouth daily. (Patient not taking: Reported on 12/08/2021) 30 tablet 1  ? Insulin Pen Needle (PEN NEEDLES) 32G X 4 MM MISC 1 each by Does not apply route at bedtime. 100 each 3  ? ? ? ? ?Home: ?Home Living ?Family/patient expects to be discharged to:: Inpatient rehab ?Living Arrangements: Spouse/significant other, Children ?Available Help at Discharge: Family, Available PRN/intermittently ?Type of Home: Apartment ?Home Access: Level entry ?Home Layout: One level ?Additional Comments: pt lethargic during history, unable to provide full reliable history at this time ?  ?Functional History: ?Prior Function ?Prior Level of Function : Patient poor historian/Family not available ?Mobility Comments: pt reports ambulating with use of a walker ?ADLs Comments: pt unable to report prior level of function with ADLs ? ?Functional Status:  ?Mobility: ?Bed Mobility ?Overal bed mobility: Needs Assistance ?Bed Mobility: Supine to Sit ?Supine to sit: Min assist ?Sit to supine: Max assist ?General bed mobility comments: hand hold to aide in shiting hips toward edge of bed as well as to elevate trunk into sitting ?Transfers ?Overall transfer level: Needs assistance ?Equipment used: Rolling walker (2 wheels) ?Transfers: Sit to/from Stand, Bed to chair/wheelchair/BSC ?Sit to Stand: Mod assist, Min assist ?Bed  to/from chair/wheelchair/BSC transfer type:: Step pivot ?Stand pivot transfers: Max assist ?Step pivot transfers: Max assist ?Transfer via Lift Equipment: Stedy ?General transfer comment: pt with R knee instability and weakness. PT provides cues for hand and foot placement prior to initiating stand. PT provides verbal and tactile cues for weight shift in an effort to step. Pt with R knee buckles with initial steps of left foot ?Ambulation/Gait ?Pre-gait activities: weight shifting in standing with attempts at stepping to clear RLE. Pt inconsistently able to clear R foot from floor. ?  ? ?ADL: ?ADL ?Overall ADL's : Needs assistance/impaired ?General ADL Comments: currently total A for all basic ADLs with Mod A sit<>stand and Max A to stand pivot ? ?Cognition: ?Cognition ?Overall Cognitive Status: Impaired/Different from baseline ?Arousal/Alertness: Awake/alert ?Orientation Level: Oriented X4 ?Attention: Sustained ?Sustained Attention: Appears intact ?Cognition ?Arousal/Alertness: Awake/alert ?Behavior During Therapy: [facility] for tasks assessed/performed ?Overall Cognitive Status: Impaired/Different from baseline ?Area of Impairment: Awareness ?Awareness: Emergent ?General Comments: conversing today, occasionally unable to understand what she is saying. Continues to move LLE when cued to move R but when  attention brought to this able to move R foot ? ? ?Blood pressure (!) 163/85, pulse 81, temperature 98.1 ?F (36.7 ?C), temperature source Oral, resp. rate 13, height _0  (1.651 m), weight 69.4 kg, SpO2 100 %. ? ? ?Gen: no distress, normal appearing ?HEENT: oral mucosa pink and moist, NCAT ?Cardio: RRR, no MRG ?Chest: normal effort, normal rate of breathing, non-labored breathing ?Abd: soft, non-distended, NT, + BS  ?Ext: no edema, no cyanosis ?Psych: very pleasant, normal affect ?Skin: intact, warm, dry ? ?Neurological:  ?   Mental Status: She is alert.  ?   Comments: Mild right facial weakness with mild dysarthria.  No  aphasia noted. No neglect noted. Able to follow simple motor commands without difficulty. Able to answer basic orientation questions. RUE>RLE weakness. Sensory deficits BLE at baseline.   ? ?Visual fields normal

## 2022-01-02 NOTE — H&P (Signed)
H&P written by Marciano Sequin ?

## 2022-01-02 NOTE — Progress Notes (Signed)
Occupational Therapy Treatment ?Patient Details ?Name: Stacey Lucas ?MRN: [id number] ?DOB: [birth year] ?Today's Date: 01/02/2022 ? ? ?History of present illness 46 y.o. female presents to [facility] hospital with weakness, L facial droop and slurred speech. Once at hospital pt developing R facial weakness. MRI cancelled due to fluctuating neurological symptoms. Pt received tNK. PMH includes R CVA, cognitive deficits, DM, HTN, HLD, charcot involving RLE. ?  ?OT comments ? Patient with good progress toward patient focused goals compared to her eval status.  Patient able to incorporate her right upper extremity more with functional tasks.  The patient needing setup, supervision and up to Min A for light grooming seated.  She was able to place her socks with up to Mod A from a seated level, and able to perform AROM to her fist, wrist, forearm, elbow and shoulder with AA as needed from OT.  Patient completed 5 reps of each exercise seated.  OT to continue efforts in the acute setting to maximize her functional status, with AIR still recommended for post acute rehab.    ? ?Recommendations for follow up therapy are one component of a multi-disciplinary discharge planning process, led by the attending physician.  Recommendations may be updated based on patient status, additional functional criteria and insurance authorization. ?   ?Follow Up Recommendations ? Acute inpatient rehab (3hours/day)  ?  ?Assistance Recommended at Discharge Frequent or constant Supervision/Assistance  ?Patient can return home with the following ? A lot of help with walking and/or transfers;A lot of help with bathing/dressing/bathroom;Assistance with cooking/housework;Assistance with feeding;Assist for transportation;Help with stairs or ramp for entrance;Direct supervision/assist for financial management;Direct supervision/assist for medications management ?  ?Equipment Recommendations ?    ?  ?Recommendations for Other Services   ? ?  ?Precautions /  Restrictions Precautions ?Precautions: Fall ?Precaution Comments: R charcot foot ?Restrictions ?Weight Bearing Restrictions: No  ? ? ?  ? ?Mobility Bed Mobility ?Overal bed mobility: Needs Assistance ?Bed Mobility: Sit to Supine ?  ?  ?  ?Sit to supine: Min assist ?  ?  ?  ? ?Transfers ?Overall transfer level: Needs assistance ?Equipment used: 1 person hand held assist ?Transfers: Sit to/from Stand, Bed to chair/wheelchair/BSC ?Sit to Stand: Mod assist ?  ?  ?Step pivot transfers: Max assist ?  ?  ?  ?  ?  ?Balance Overall balance assessment: Needs assistance ?Sitting-balance support: Feet supported, Single extremity supported ?Sitting balance-Leahy Scale: Fair ?  ?  ?Standing balance support: Bilateral upper extremity supported ?Standing balance-Leahy Scale: Poor ?  ?  ?  ?  ?  ?  ?  ?  ?  ?  ?  ?  ?   ? ?ADL either performed or assessed with clinical judgement  ? ?ADL   ?  ?  ?Grooming: Wash/dry hands;Wash/dry face;Oral care;Set up;Minimal assistance;Sitting ?  ?  ?  ?  ?  ?Upper Body Dressing : Minimal assistance ?Upper Body Dressing Details (indicate cue type and reason): bring around her back.  Cues to dress weak side first ?Lower Body Dressing: Moderate assistance;Sit to/from stand;Maximal assistance ?Lower Body Dressing Details (indicate cue type and reason): difficulty using R UE for placing socks, and needs assist to stand for balance. ?  ?  ?  ?  ?  ?  ?  ?  ?  ? ?Extremity/Trunk Assessment Upper Extremity Assessment ?Upper Extremity Assessment: RUE deficits/detail ?RUE Deficits / Details: weak grip, but able to actively move R UE through full ROM.  Grossly 2+/5 for pivots below  her shoulder, and 2/5 to R shoulder.  Able to bring her hand to her mouth.  Can grasp cup with her R using L as stabilize assist. ?RUE Sensation: WNL ?RUE Coordination: decreased fine motor;decreased gross motor ?LUE Deficits / Details: WFL's. ?  ?Lower Extremity Assessment ?Lower Extremity Assessment: Defer to PT evaluation ?   ?Cervical / Trunk Assessment ?Cervical / Trunk Assessment: Kyphotic ?  ? ?Vision Patient Visual Report: No change from baseline ?  ?  ?Perception Perception ?Perception: Impaired ?Comments: At times she needs to be reminded to use her L side to compensate.  Possible inattention from prior CVA ?  ?Praxis Praxis ?Praxis: Intact ?  ? ?Cognition Arousal/Alertness: Awake/alert ?Behavior During Therapy: [facility] for tasks assessed/performed ?  ?Area of Impairment: Safety/judgement ?  ?  ?  ?  ?  ?  ?  ?  ?  ?  ?  ?  ?Safety/Judgement: Decreased awareness of safety ?  ?  ?  ?  ?  ?   ?   ? ?  ?   ? ? ?  ?General Comments VSS on RA  ? ? ?Pertinent Vitals/ Pain       Pain Assessment ?Pain Assessment: No/denies pain ?Pain Intervention(s): Monitored during session ? ?   ?  ?  ?  ?  ?  ?  ?  ?  ?  ?  ?  ?  ?  ?  ?  ?  ?  ?  ? ?  ?    ?  ?  ?  ?   ? ?Frequency ? Min 2X/week  ? ? ? ? ?  ?Progress Toward Goals ? ?OT Goals(current goals can now be found in the care plan section) ? Progress towards OT goals: Progressing toward goals ? ?Acute Rehab OT Goals ?OT Goal Formulation: With patient ?Time For Goal Achievement: 01/14/22 ?Potential to Achieve Goals: Good  ?Plan Discharge plan remains appropriate   ? ?Co-evaluation ? ? ?   ?  ?  ?  ?  ? ?  ?AM-PAC OT "6 Clicks" Daily Activity     ?Outcome Measure ? ? Help from another person eating meals?: A Little ?Help from another person taking care of personal grooming?: A Little ?Help from another person toileting, which includes using toliet, bedpan, or urinal?: A Lot ?Help from another person bathing (including washing, rinsing, drying)?: A Lot ?Help from another person to put on and taking off regular upper body clothing?: A Lot ?Help from another person to put on and taking off regular lower body clothing?: A Lot ?6 Click Score: 14 ? ?  ?End of Session Equipment Utilized During Treatment: Gait belt ? ?OT Visit Diagnosis: Unsteadiness on feet (R26.81);Other abnormalities of gait and  mobility (R26.89);Muscle weakness (generalized) (M62.81);Other symptoms and signs involving cognitive function;Hemiplegia and hemiparesis ?Hemiplegia - Right/Left: Right ?Hemiplegia - dominant/non-dominant: Dominant ?Hemiplegia - caused by: Cerebral infarction ?  ?Activity Tolerance Patient tolerated treatment well ?  ?Patient Left in bed;with call bell/phone within reach ?  ?Nurse Communication Mobility status ?  ? ?   ? ?Time: 1017-5102 ?OT Time Calculation (min): 27 min ? ?Charges: OT General Charges ?$OT Visit: 1 Visit ?OT Treatments ?$Self Care/Home Management : 23-37 mins ? ?01/02/2022 ? ?RP, OTR/L ? ?Acute Rehabilitation Services ? ?Office:  [phone] ? ? ?Charels Stambaugh D Mackinley Cassaday ?01/02/2022, 12:22 PM ?

## 2022-01-02 NOTE — Progress Notes (Signed)
Inpatient Rehabilitation Admission Medication Review by a Pharmacist ? ?A complete drug regimen review was completed for this patient to identify any potential clinically significant medication issues. ? ?High Risk Drug Classes Is patient taking? Indication by Medication  ?Antipsychotic Yes Compazine- N/V  ?Anticoagulant Yes Lovenox- VTE prophylaxis  ?Antibiotic No   ?Opioid No   ?Antiplatelet Yes Aspirin, plavix- CVA prophylaxis  ?Hypoglycemics/insulin Yes Insulin- T2DM  ?Vasoactive Medication Yes Avapro, Toprol XL- hypertension  ?Chemotherapy No   ?Other Yes Protonix- GERD ?Crestor- HLD ?Celexa- MDD / Anxiety ?Trazodone- sleep  ? ? ? ?Type of Medication Issue Identified Description of Issue Recommendation(s)  ?Drug Interaction(s) (clinically significant) ?    ?Duplicate Therapy ?    ?Allergy ?    ?No Medication Administration End Date ? Plavix Per neurology- DAPT x3 weeks f/b aspirin alone. Plavix end date: 01/19/2022  ?Incorrect Dose ?    ?Additional Drug Therapy Needed ?    ?Significant med changes from prior encounter (inform family/care partners about these prior to discharge).    ?Other ? PTA meds ?Astelin ?Diovan Restart PTA meds when and if clinically necessary during CIR admission or at time of discharge, if warranted  ? ? ?Clinically significant medication issues were identified that warrant physician communication and completion of prescribed/recommended actions by midnight of the next day:  No ? ?Time spent performing this drug regimen review (minutes):  30 ? ? ?Praneel Haisley BS, PharmD, BCPS ?Clinical Pharmacist ?01/03/2022 4:33 AM ? ?Contact: [phone] after 3 PM ? ?"Be curious, not judgmental..." -Debbora Dus ?

## 2022-01-02 NOTE — Progress Notes (Signed)
Patient arrived on unit. Rehab schedule, medications and plan of care reviewed, patient states an understanding. Patient is Ax4 and has no complications noted at this time. Patient reports pain 0 out of 10. Patient educated on use of call light. Stacey Lucas G Stacey Lucas  

## 2022-01-02 NOTE — Progress Notes (Signed)
[name], MD  ?Physician ?Physical Medicine and Rehabilitation ?PMR Pre-admission    ?Signed ?Date of Service:  01/02/2022  1:07 PM ? Related encounter: ED to Hosp-Admission (Current) from 12/29/2021 in [place] NEUR[facility]AUMA/SURGICAL ICU ?  ?Signed    ?  ?Show:Clear all ?[x] Written[x] Templated[x] Copied ? ?Added by: ?[x] Avyn Coate, Vertis Kelch, RN[x] [name], MD ? ?[] Hover for details ?   ?   ?   ?   ?   ?   ?   ?   ?   ?   ?   ?   ?   ?   ?   ?   ?   ?   ?   ?   ?   ?   ?   ?   ?   ?   ?   ?   ?   ?   ?   ?   ?   ?   ?   ?   ?   ?   ?   ?   ?   ?   ?   ?   ?   ?   ?   ?   ?   ?   ?   ?   ?   ?   ?   ?   ?   ?   ?   ?   ?   ?   ?   ?   ?   ?   ?   ?   ?   ?   ?   ?   ?   ?   ?   ?   ?   ?   ?   ?   ?   ?   ?   ?   ?   ?   ?   ?   ?   ?   ?   ?   ?   ?   ?   ?   ?   ?   ?   ?   ?   ?   ?   ?   ?   ?   ?   ?   ?   ?PMR Admission Coordinator Pre-Admission Assessment ?  ?Patient: Stacey Lucas is an 46 y.o., female ?MRN: [id number] ?DOB: 04-09-1976 ?Height: 5\' 5"  (165.1 cm) ?Weight: 69.4 kg ?  ?Insurance Information ?HMO:     PPO:      PCP:      IPA:      80/20:      OTHER:  ?PRIMARY: [place] [employer] of [place]      Policy#: [id number]      Subscriber: pt ?CM Name: approved per Elmyra Ricks      Phone#: [phone]     Fax#: [phone] ?Pre-Cert#: [id number] until 01/15/22      Employer: [employer] ?Benefits:  Phone #: [phone]     Name: 4/25 ?Eff. Date: 09/09/21     Deduct: $1250      Out of Pocket Max: $4890      Life Max: none ?CIR: 80%      SNF: 80% 100 days ?Outpatient: $10 to $52 per visit     Co-Pay: visits per medical neccesity ?Home Health: 80%      Co-Pay: visits per medical neccesity ?DME: 80%     Co-Pay: 20% ?Providers: in network ? ?SECONDARY: none ?  ?Financial Counselor:  Phone#:  ?  ?The ?Data Collection Information Summary? for patients in Inpatient Rehabilitation Facilities with attached ?Privacy Act [place] Records? was provided and verbally reviewed with: N/A ?  ?Emergency  Contact Information ?Contact Information   ?  ?  Name Relation Home Work Mobile  ?  Christians,Domique Daughter     [phone]  ?  Vanita Panda Friend     [phone]  ?  Abelardo Diesel   [phone]      ?  Cohoon-Marsh,Antonia Daughter     [phone]  ?  ?   ?  ?Current Medical History  ?Patient Admitting Diagnosis: CVA ?  ?History of Present Illness: 46 year old female with history of CVA 9/22, DM, HTN, HLD and migraines. Charcot involving the right leg with chronic right leg weakness. Presented on 12/29/21 with fluctuating neurological symptoms. She had near resolution of original left sided weakness and on the way to MRI developed right sided weakness, dysarthria and facial droop. MRI was aborted and TNKase was given in ED. MRI showed an acute infarct of the left corona radiata/basal ganglia. DAPT started and DVT prophylaxis with Lovenox. TCD and TEE negative. Cocaine positive. LDL 118 and Hgb A1c 7.1.No antithrombotic prior to admission. Plan DAPT for 3 weeks and then Asa alone. Home meds of Valsartan and Toprol XL. To allow for permissive HTN. Home med of rosuvastatin and to resume with LDL 117. Home meds of insulin to resume and SSI. Advised to stop smoking and cocaine use. UTI on Rocephin . ?  ?Complete NIHSS TOTAL: 5 ?  ?Patient's medical record from [facility] has been reviewed by the rehabilitation admission coordinator and physician. ?  ?Past Medical History  ?    ?Past Medical History:  ?Diagnosis Date  ? Anxiety and depression    ? Cocaine abuse ([place])    ? Depression with suicidal ideation    ?  [facility] admission 04/2018  ? Diabetes mellitus without complication ([place])    ?  Type II  ? GERD (gastroesophageal reflux disease)    ? Hyperlipidemia    ? Hypertension    ? Impingement syndrome of right shoulder    ? Migraine headache    ? Osteoarthritis of right knee 05/24/2021  ? Pilonidal abscess 05/2013  ? Pyelonephritis    ? Right thyroid nodule    ? Sciatica    ?  ?Has the patient had major surgery  during 100 days prior to admission? No ?  ?Family History   ?family history includes Arthritis in her mother; Diabetes in her father and mother; Hypertension in her father and mother. ?  ?Current Medications ?  ?Current Facility-Administered Medications:  ?  0.9 %  sodium chloride infusion, , Intravenous, PRN, Donnetta Simpers, MD, Stopped at 01/01/22 0932 ?  acetaminophen (TYLENOL) tablet 650 mg, 650 mg, Oral, Q4H PRN, 650 mg at 12/29/21 1939 **OR** acetaminophen (TYLENOL) 160 MG/5ML solution 650 mg, 650 mg, Per Tube, Q4H PRN **OR** acetaminophen (TYLENOL) suppository 650 mg, 650 mg, Rectal, Q4H PRN, Amie Portland, MD ?  ALPRAZolam Duanne Moron) tablet 0.25 mg, 0.25 mg, Oral, TID PRN, Corky Sox, MD ?  aspirin EC tablet 81 mg, 81 mg, Oral, Daily, [name], Devon, NP, 81 mg at 01/02/22 1002 ?  cefTRIAXone (ROCEPHIN) 1 g in sodium chloride 0.9 % 100 mL IVPB, 1 g, Intravenous, Once, Janine Ores, NP ?  Chlorhexidine Gluconate Cloth 2 % PADS 6 each, 6 each, Topical, Daily, Greta Doom, MD, 6 each at 01/02/22 0030 ?  citalopram (CELEXA)  tablet 10 mg, 10 mg, Oral, Daily, [name], Devon, NP, 10 mg at 01/02/22 1002 ?  clopidogrel (PLAVIX) tablet 75 mg, 75 mg, Oral, Daily, [name], Devon, NP, 75 mg at 01/02/22 1003 ?  diclofenac Sodium (VOLTAREN) 1 % topical gel 4 g, 4 g, Topical, Daily PRN, Janine Ores, NP ?  enoxaparin (LOVENOX) injection 40 mg, 40 mg, Subcutaneous, Q24H, [name], Devon, NP, 40 mg at 01/01/22 1439 ?  hydrALAZINE (APRESOLINE) injection 10 mg, 10 mg, Intravenous, Q4H PRN, Janine Ores, NP, 10 mg at 12/31/21 0015 ?  insulin aspart (novoLOG) injection 0-20 Units, 0-20 Units, Subcutaneous, TID WC, [name], Devon, NP ?  insulin aspart (novoLOG) injection 0-5 Units, 0-5 Units, Subcutaneous, QHS, [name], Devon, NP ?  irbesartan (AVAPRO) tablet 150 mg, 150 mg, Oral, Daily, [name], Devon, NP, 150 mg at 01/02/22 1003 ?  labetalol (NORMODYNE) injection 10 mg, 10 mg, Intravenous, Q2H PRN, Janine Ores, NP ?   metoprolol succinate (TOPROL-XL) 24 hr tablet 25 mg, 25 mg, Oral, Daily, [name], Devon, NP, 25 mg at 01/02/22 1002 ?  pantoprazole (PROTONIX) EC tablet 40 mg, 40 mg, Oral, QHS, Garvin Fila, MD, 40 mg at 12/31/21 2136 ?  rosuvastatin (CRESTOR) tablet 20 mg, 20 mg, Oral, Daily, [name], Devon, NP, 20 mg at 01/02/22 1002 ?  senna-docusate (Senokot-S) tablet 1 tablet, 1 tablet, Oral, QHS PRN, Amie Portland, MD, 1 tablet at 12/31/21 2136 ?  ?Patients Current Diet:  ?Diet Order   ?  ?         ?    Diet heart healthy/carb modified Room service appropriate? Yes with Assist; Fluid consistency: Thin  Diet effective now       ?  ?  ?   ?  ?  ?   ?  ?Precautions / Restrictions ?Precautions ?Precautions: Fall ?Precaution Comments: R charcot foot ?Restrictions ?Weight Bearing Restrictions: No  ?  ?Has the patient had 2 or more falls or a fall with injury in the past year? No ?  ?Prior Activity Level ?Limited Community (1-2x/wk): Mod I with RW over past 2 months ?  ?Prior Functional Level ?Self Care: Did the patient need help bathing, dressing, using the toilet or eating? Needed some help ?  ?Indoor Mobility: Did the patient need assistance with walking from room to room (with or without device)? Independent ?  ?Stairs: Did the patient need assistance with internal or external stairs (with or without device)? Independent ?  ?Functional Cognition: Did the patient need help planning regular tasks such as shopping or remembering to take medications? Needed some help ?  ?Patient Information ?Are you of Hispanic, Latino/a,or Spanish origin?: A. No, not of Hispanic, Latino/a, or Spanish origin ?What is your race?: B. Black or African American ?Do you need or want an interpreter to communicate with a doctor or health care staff?: 0. No ?  ?Patient's Response To:  ?Health Literacy and Transportation ?Is the patient able to respond to health literacy and transportation needs?: Yes ?Health Literacy - How often do you need to have  someone help you when you read instructions, pamphlets, or other written material from your doctor or pharmacy?: Never ?In the past 12 months, has lack of transportation kept you from medical appointments or

## 2022-01-02 NOTE — H&P (Signed)
? ? ?Physical Medicine and Rehabilitation Admission H&P ? ?  ?Chief Complaint  ?Patient presents with  ? Functional deficits due to Stroke  ? ? ?HPI: Stacey Lucas is a 46 year old female with history of uncontrolled T2DM with neuropathy/RLE weakness, CVA 09/22, thyroid nodule, depression, ongoing cocaine abuse who was admitted on 12/29/21 with left sided weakness, left facial weakness and slurred speech which resolved transiently but then developed near flaccidity of RUE, RLE>LLE drift, slurred speech and right facial droop while in ED. UDS positive for cocaine. CTA head/neck was negative for LVO, showed moderate to severe focal stenosis in mid left M2 MCA vessel as well as right thyroid nodule. Elevated BP treated with IV labetalol/clevidipine and she received TNK.   Follow up MRI brain revealed acute infarct in left corona radiata and basal ganglia with chronic small vessel infarcts in bilateral CR/BG.  2D echo showed EF 70-75% with mild LVH and follow up TEE showed EF 60-65% with negative bubble study, no intra-atrial shunt or cardiac thrombus.  ?  ?EEG negative for seizures. Hospital course significant for Acute on chronic renal failure with rise in SCr to 1.66 as well as leucocytosis due to strep agalactiae UTI. She was started on ceftriaxone X 3 doses today.  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 Leonie Man recommended DAPT X 3 weeks followed by ASA alone. She has also been educated on cocaine and tobacco cessation. To follow up with PCP for evaluation of thyroid nodule. She continues to be limited by right sided weakness with left inattention (from prior stroke?), right lean as well as right knee instability with standing attempts. CIR recommended due to functional decline. Currently complains of low back pain. ? ? ?Review of Systems  ?Constitutional:  Negative for chills and fever.  ?HENT:  Negative for hearing loss and tinnitus.   ?Eyes:  Negative for blurred vision and double vision.   ?Respiratory:  Negative for cough and shortness of breath.   ?Cardiovascular:  Negative for chest pain and leg swelling.  ?Gastrointestinal:  Negative for abdominal pain, constipation and heartburn.  ?Genitourinary:  Negative for dysuria and urgency.  ?Musculoskeletal:  Positive for back pain and joint pain (right knee).  ?Skin:  Negative for rash.  ?Neurological:  Positive for sensory change (numbness left foot> right foot), speech change, weakness and headaches. Negative for dizziness.  ?Psychiatric/Behavioral:  The patient is nervous/anxious and has insomnia.   ? ? ?Past Medical History:  ?Diagnosis Date  ? Anxiety and depression   ? Cocaine abuse ([place])   ? Depression with suicidal ideation   ? [facility] admission 04/2018  ? Diabetes mellitus without complication ([place])   ? Type II  ? GERD (gastroesophageal reflux disease)   ? Hyperlipidemia   ? Hypertension   ? Impingement syndrome of right shoulder   ? Migraine headache   ? Osteoarthritis of right knee 05/24/2021  ? Pilonidal abscess 05/2013  ? Pyelonephritis   ? Right thyroid nodule   ? Sciatica   ? ? ?Past Surgical History:  ?Procedure Laterality Date  ? BUBBLE STUDY  01/01/2022  ? Procedure: BUBBLE STUDY;  Surgeon: Janina Mayo, MD;  Location: [place];  Service: Cardiovascular;;  ? TEE WITHOUT CARDIOVERSION N/A 01/01/2022  ? Procedure: TRANSESOPHAGEAL ECHOCARDIOGRAM (TEE);  Surgeon: Janina Mayo, MD;  [facility] ENDOSCOPY;  Service: Cardiovascular;  Laterality: N/A;  ? tubal    ? ? ?Family History  ?Problem Relation Age of Onset  ? Diabetes Mother   ?  Hypertension Mother   ? Arthritis Mother   ? Diabetes Father   ? Hypertension Father   ? ? ?Social History: Divorced. Works as Secretary/administrator at [employer]. She reports that she has been smoking cigarettes. She has been smoking an average of 1 pack per day. She has never used smokeless tobacco. She reports current alcohol use--6 pack/week. She reports cocaine bout once a week.  ? ? ?Allergies: No Known  Allergies ? ? ?Medications Prior to Admission  ?Medication Sig Dispense Refill  ? azelastine (ASTELIN) 0.1 % nasal spray Place 1-2 sprays into both nostrils 2 (two) times daily as needed for allergies.    ? diclofenac Sodium (VOLTAREN) 1 % GEL Apply 4 g topically daily as needed (For knee pain).    ? metoprolol succinate (TOPROL-XL) 25 MG 24 hr tablet Take 25 mg by mouth daily.    ? rosuvastatin (CRESTOR) 10 MG tablet Take 10 mg by mouth at bedtime.    ? valsartan (DIOVAN) 160 MG tablet Take 160 mg by mouth daily.    ? blood glucose meter kit and supplies KIT Check fasting blood sugar in the morning. 1 each 0  ? clopidogrel (PLAVIX) 75 MG tablet Take 1 tablet (75 mg total) by mouth daily. (Patient not taking: Reported on 12/08/2021) 30 tablet 1  ? Insulin Pen Needle (PEN NEEDLES) 32G X 4 MM MISC 1 each by Does not apply route at bedtime. 100 each 3  ? ? ?Home: ?Home Living ?Family/patient expects to be discharged to:: Inpatient rehab ?Living Arrangements: Spouse/significant other, Children ?Available Help at Discharge: Family, Available PRN/intermittently ?Type of Home: Apartment ?Home Access: Level entry ?Home Layout: One level ?Additional Comments: pt lethargic during history, unable to provide full reliable history at this time ?  ?Functional History: ?Prior Function ?Prior Level of Function : Patient poor historian/Family not available ?Mobility Comments: pt reports ambulating with use of a walker ?ADLs Comments: pt unable to report prior level of function with ADLs ?  ?Functional Status:  ?Mobility: ?Bed Mobility ?Overal bed mobility: Needs Assistance ?Bed Mobility: Supine to Sit ?Supine to sit: Min assist ?Sit to supine: Max assist ?General bed mobility comments: hand hold to aide in shiting hips toward edge of bed as well as to elevate trunk into sitting ?Transfers ?Overall transfer level: Needs assistance ?Equipment used: Rolling walker (2 wheels) ?Transfers: Sit to/from Stand, Bed to chair/wheelchair/BSC ?Sit  to Stand: Mod assist, Min assist ?Bed to/from chair/wheelchair/BSC transfer type:: Step pivot ?Stand pivot transfers: Max assist ?Step pivot transfers: Max assist ?Transfer via Lift Equipment: Stedy ?General transfer comment: pt with R knee instability and weakness. PT provides cues for hand and foot placement prior to initiating stand. PT provides verbal and tactile cues for weight shift in an effort to step. Pt with R knee buckles with initial steps of left foot ?Ambulation/Gait ?Pre-gait activities: weight shifting in standing with attempts at stepping to clear RLE. Pt inconsistently able to clear R foot from floor. ?  ?ADL: ?ADL ?Overall ADL's : Needs assistance/impaired ?General ADL Comments: currently total A for all basic ADLs with Mod A sit<>stand and Max A to stand pivot ?  ?Cognition: ?Cognition ?Overall Cognitive Status: Impaired/Different from baseline ?Arousal/Alertness: Awake/alert ?Orientation Level: Oriented X4 ?Attention: Sustained ?Sustained Attention: Appears intact ?Cognition ?Arousal/Alertness: Awake/alert ?Behavior During Therapy: [facility] for tasks assessed/performed ?Overall Cognitive Status: Impaired/Different from baseline ?Area of Impairment: Awareness ?Awareness: Emergent ?General Comments: conversing today, occasionally unable to understand what she is saying. Continues to move LLE when cued to  move R but when attention brought to this able to move R foot ?  ?  ? ? ?Blood pressure (!) 173/82, pulse 78, temperature 98.3 ?F (36.8 ?C), resp. rate 17, weight 70.1 kg, SpO2 100 %. ? ? ?Gen: no distress, normal appearing ?HEENT: oral mucosa pink and moist, NCAT ?Cardio: RRR, no MRG ?Chest: normal effort, normal rate of breathing, non-labored breathing ?Abd: soft, non-distended, NT, + BS  ?Ext: no edema, no cyanosis ?Psych: very pleasant, normal affect ?Skin: intact, warm, dry ? ?Neurological:  ?   Mental Status: She is alert.  ?   Comments: Mild right facial weakness with mild dysarthria.  No  aphasia noted. No neglect noted. Able to follow simple motor commands without difficulty. Able to answer basic orientation questions. RUE>RLE weakness. Sensory deficits BLE at baseline.   ? ?Visual fields normal in all qua

## 2022-01-02 NOTE — Progress Notes (Signed)
Inpatient Rehabilitation Admissions Coordinator  ? ?I met with patient at bedside to discuss cost of care for Cir pending insurance approval, as well as to clarify caregiver supports after rehab stay. She states her boyfriend's sisters can assist. States her daughter, Stacey Lucas is unreliable. She asks that her oldest daughter, Stacey Lucas, and her boyfriend, Stacey Lucas,  be called in case of emergency. I will obtain Stacey number and provide to her chart.  ? ?I await [employer] approval to admit her to CIR. ? ?Danne Baxter, RN, MSN ?Rehab Admissions Coordinator ?[phone] ?01/02/2022 11:43 AM ? ?

## 2022-01-02 NOTE — PMR Pre-admission (Signed)
PMR Admission Coordinator Pre-Admission Assessment ? ?Patient: Stacey Lucas is an 46 y.o., female ?MRN: [id number] ?DOB: 09/26/1975 ?Height: 5\' 5"  (165.1 cm) ?Weight: 69.4 kg ? ?Insurance Information ?HMO:     PPO:      PCP:      IPA:      80/20:      OTHER:  ?PRIMARY: [place] [employer] [place]      Policy#: [id number]      Subscriber: pt ?CM Name: approved per Elmyra Ricks      Phone#: [phone]     Fax#: [phone] ?Pre-Cert#: [id number] until 01/15/22      Employer: [employer] ?Benefits:  Phone #: [phone]     Name: 4/25 ?Eff. Date: 09/09/21     Deduct: $1250      Out of Pocket Max: $4890      Life Max: none ?CIR: 80%      SNF: 80% 100 days ?Outpatient: $10 to $52 per visit     Co-Pay: visits per medical neccesity ?Home Health: 80%      Co-Pay: visits per medical neccesity ?DME: 80%     Co-Pay: 20% ?Providers: in network ? ?SECONDARY: none ? ?Financial Counselor:       Phone#:  ? ?The ?Data Collection Information Summary? for patients in Inpatient Rehabilitation Facilities with attached ?Privacy Act [place] Records? was provided and verbally reviewed with: N/A ? ?Emergency Contact Information ?Contact Information   ? ? Name Relation Home Work Mobile  ? Dovidio,Domique Daughter   [phone]  ? Vanita Panda Friend   [phone]  ? Abelardo Diesel  [phone]    ? Holmes-Marsh,Antonia Daughter   [phone]  ? ?  ? ?Current Medical History  ?Patient Admitting Diagnosis: CVA ? ?History of Present Illness: 46 year old female with history of CVA 9/22, DM, HTN, HLD and migraines. Charcot involving the right leg with chronic right leg weakness. Presented on 12/29/21 with fluctuating neurological symptoms. She had near resolution of original left sided weakness and on the way to MRI developed right sided weakness, dysarthria and facial droop. MRI was aborted and TNKase was given in ED. MRI showed an acute infarct of the left corona radiata/basal ganglia. DAPT started and DVT prophylaxis with Lovenox. TCD  and TEE negative. Cocaine positive. LDL 118 and Hgb A1c 7.1.No antithrombotic prior to admission. Plan DAPT for 3 weeks and then Asa alone. Home meds of Valsartan and Toprol XL. To allow for permissive HTN. Home med of rosuvastatin and to resume with LDL 117. Home meds of insulin to resume and SSI. Advised to stop smoking and cocaine use. UTI on Rocephin . ? ?Complete NIHSS TOTAL: 5 ? ?Patient's medical record from [facility] has been reviewed by the rehabilitation admission coordinator and physician. ? ?Past Medical History  ?Past Medical History:  ?Diagnosis Date  ? Anxiety and depression   ? Cocaine abuse ([place])   ? Depression with suicidal ideation   ? [facility] admission 04/2018  ? Diabetes mellitus without complication ([place])   ? Type II  ? GERD (gastroesophageal reflux disease)   ? Hyperlipidemia   ? Hypertension   ? Impingement syndrome of right shoulder   ? Migraine headache   ? Osteoarthritis of right knee 05/24/2021  ? Pilonidal abscess 05/2013  ? Pyelonephritis   ? Right thyroid nodule   ? Sciatica   ? ?Has the patient had major surgery during 100 days prior to admission? No ? ?Family History   ?family history includes Arthritis in her mother; Diabetes  in her father and mother; Hypertension in her father and mother. ? ?Current Medications ? ?Current Facility-Administered Medications:  ?  0.9 %  sodium chloride infusion, , Intravenous, PRN, Donnetta Simpers, MD, Stopped at 01/01/22 0932 ?  acetaminophen (TYLENOL) tablet 650 mg, 650 mg, Oral, Q4H PRN, 650 mg at 12/29/21 1939 **OR** acetaminophen (TYLENOL) 160 MG/5ML solution 650 mg, 650 mg, Per Tube, Q4H PRN **OR** acetaminophen (TYLENOL) suppository 650 mg, 650 mg, Rectal, Q4H PRN, Amie Portland, MD ?  ALPRAZolam Duanne Moron) tablet 0.25 mg, 0.25 mg, Oral, TID PRN, Corky Sox, MD ?  aspirin EC tablet 81 mg, 81 mg, Oral, Daily, [name], Devon, NP, 81 mg at 01/02/22 1002 ?  cefTRIAXone (ROCEPHIN) 1 g in sodium chloride 0.9 % 100 mL IVPB, 1 g, Intravenous,  Once, Janine Ores, NP ?  Chlorhexidine Gluconate Cloth 2 % PADS 6 each, 6 each, Topical, Daily, Greta Doom, MD, 6 each at 01/02/22 0030 ?  citalopram (CELEXA) tablet 10 mg, 10 mg, Oral, Daily, [name], Devon, NP, 10 mg at 01/02/22 1002 ?  clopidogrel (PLAVIX) tablet 75 mg, 75 mg, Oral, Daily, [name], Devon, NP, 75 mg at 01/02/22 1003 ?  diclofenac Sodium (VOLTAREN) 1 % topical gel 4 g, 4 g, Topical, Daily PRN, Janine Ores, NP ?  enoxaparin (LOVENOX) injection 40 mg, 40 mg, Subcutaneous, Q24H, [name], Devon, NP, 40 mg at 01/01/22 1439 ?  hydrALAZINE (APRESOLINE) injection 10 mg, 10 mg, Intravenous, Q4H PRN, Janine Ores, NP, 10 mg at 12/31/21 0015 ?  insulin aspart (novoLOG) injection 0-20 Units, 0-20 Units, Subcutaneous, TID WC, [name], Devon, NP ?  insulin aspart (novoLOG) injection 0-5 Units, 0-5 Units, Subcutaneous, QHS, [name], Devon, NP ?  irbesartan (AVAPRO) tablet 150 mg, 150 mg, Oral, Daily, [name], Devon, NP, 150 mg at 01/02/22 1003 ?  labetalol (NORMODYNE) injection 10 mg, 10 mg, Intravenous, Q2H PRN, Janine Ores, NP ?  metoprolol succinate (TOPROL-XL) 24 hr tablet 25 mg, 25 mg, Oral, Daily, [name], Devon, NP, 25 mg at 01/02/22 1002 ?  pantoprazole (PROTONIX) EC tablet 40 mg, 40 mg, Oral, QHS, Garvin Fila, MD, 40 mg at 12/31/21 2136 ?  rosuvastatin (CRESTOR) tablet 20 mg, 20 mg, Oral, Daily, [name], Devon, NP, 20 mg at 01/02/22 1002 ?  senna-docusate (Senokot-S) tablet 1 tablet, 1 tablet, Oral, QHS PRN, Amie Portland, MD, 1 tablet at 12/31/21 2136 ? ?Patients Current Diet:  ?Diet Order   ? ?       ?  Diet heart healthy/carb modified Room service appropriate? Yes with Assist; Fluid consistency: Thin  Diet effective now       ?  ? ?  ?  ? ?  ? ?Precautions / Restrictions ?Precautions ?Precautions: Fall ?Precaution Comments: R charcot foot ?Restrictions ?Weight Bearing Restrictions: No  ? ?Has the patient had 2 or more falls or a fall with injury in the past year? No ? ?Prior Activity  Level ?Limited Community (1-2x/wk): Mod I with RW over past 2 months ? ?Prior Functional Level ?Self Care: Did the patient need help bathing, dressing, using the toilet or eating? Needed some help ? ?Indoor Mobility: Did the patient need assistance with walking from room to room (with or without device)? Independent ? ?Stairs: Did the patient need assistance with internal or external stairs (with or without device)? Independent ? ?Functional Cognition: Did the patient need help planning regular tasks such as shopping or remembering to take medications? Needed some help ? ?Patient Information ?Are you of Hispanic, Latino/a,or Spanish origin?: A. No,  not of Hispanic, Latino/a, or Spanish origin ?What is your race?: B. Black or African American ?Do you need or want an interpreter to communicate with a doctor or health care staff?: 0. No ? ?Patient's Response To:  ?Health Literacy and Transportation ?Is the patient able to respond to health literacy and transportation needs?: Yes ?Health Literacy - How often do you need to have someone help you when you read instructions, pamphlets, or other written material from your doctor or pharmacy?: Never ?In the past 12 months, has lack of transportation kept you from medical appointments or from getting medications?: No ?In the past 12 months, has lack of transportation kept you from meetings, work, or from getting things needed for daily living?: No ? ?Home Assistive Devices / Equipment ?Home Assistive Devices/Equipment: Bedside commode/3-in-1, Dentures (specify type), Walker (specify type) (upper dentures) ? ?Prior Device Use: Indicate devices/aids used by the patient prior to current illness, exacerbation or injury? Walker ? ?Current Functional Level ?Cognition ? Arousal/Alertness: Awake/alert ?Overall Cognitive Status: Impaired/Different from baseline ?Orientation Level: Oriented X4 ?Safety/Judgement: Decreased awareness of safety ?General Comments: conversing today,  occasionally unable to understand what she is saying. Continues to move LLE when cued to move R but when attention brought to this able to move R foot ?Attention: Sustained ?Sustained Attention: Appears intact

## 2022-01-02 NOTE — Progress Notes (Incomplete)
Report called to CIR RN belongings sent with patient.  ?

## 2022-01-02 NOTE — Progress Notes (Signed)
Physical Therapy Treatment ?Patient Details ?Name: Stacey Lucas ?MRN: [id number] ?DOB: 06/02/1976 ?Today's Date: 01/02/2022 ? ? ?History of Present Illness 46 y.o. female presents to [facility] hospital with weakness, L facial droop and slurred speech. Once at hospital pt developing R facial weakness. MRI cancelled due to fluctuating neurological symptoms. Pt received tNK. PMH includes R CVA, cognitive deficits, DM, HTN, HLD, charcot involving RLE. ? ?  ?PT Comments  ? ? Pt tolerates treatment well with improved arousal and communication. Pt demonstrates significant R sided weakness resulting in R knee buckling and instability. Pt will attempts pre-gait weight shift and stepping with inconsistent ability to minimally clear R foot. Pt will benefit from aggressive mobilization and continued therapeutic exercise in an effort to improve R side strength and reduce falls risk. PT continues to recommend AIR.   ?Recommendations for follow up therapy are one component of a multi-disciplinary discharge planning process, led by the attending physician.  Recommendations may be updated based on patient status, additional functional criteria and insurance authorization. ? ?Follow Up Recommendations ? Acute inpatient rehab (3hours/day) ?  ?  ?Assistance Recommended at Discharge Frequent or constant Supervision/Assistance  ?Patient can return home with the following Two people to help with walking and/or transfers;Two people to help with bathing/dressing/bathroom;Assistance with cooking/housework;Assistance with feeding;Direct supervision/assist for medications management;Direct supervision/assist for financial management;Assist for transportation;Help with stairs or ramp for entrance ?  ?Equipment Recommendations ? Wheelchair (measurements PT);Hospital bed  ?  ?Recommendations for Other Services   ? ? ?  ?Precautions / Restrictions Precautions ?Precautions: Fall ?Precaution Comments: R charcot foot ?Restrictions ?Weight Bearing  Restrictions: No  ?  ? ?Mobility ? Bed Mobility ?Overal bed mobility: Needs Assistance ?Bed Mobility: Supine to Sit ?  ?  ?Supine to sit: Min assist ?  ?  ?General bed mobility comments: hand hold to aide in shiting hips toward edge of bed as well as to elevate trunk into sitting ?  ? ?Transfers ?Overall transfer level: Needs assistance ?Equipment used: Rolling walker (2 wheels) ?Transfers: Sit to/from Stand, Bed to chair/wheelchair/BSC ?Sit to Stand: Mod assist, Min assist ?  ?Step pivot transfers: Max assist ?  ?  ?  ?General transfer comment: pt with R knee instability and weakness. PT provides cues for hand and foot placement prior to initiating stand. PT provides verbal and tactile cues for weight shift in an effort to step. Pt with R knee buckles with initial steps of left foot ?  ? ?Ambulation/Gait ?  ?  ?  ?  ?  ?  ?Pre-gait activities: weight shifting in standing with attempts at stepping to clear RLE. Pt inconsistently able to clear R foot from floor. ?  ? ? ?Stairs ?  ?  ?  ?  ?  ? ? ?Wheelchair Mobility ?  ? ?Modified Rankin (Stroke Patients Only) ?Modified Rankin (Stroke Patients Only) ?Pre-Morbid Rankin Score: Moderately severe disability ?Modified Rankin: Severe disability ? ? ?  ?Balance Overall balance assessment: Needs assistance ?Sitting-balance support: Single extremity supported, No upper extremity supported, Feet supported ?Sitting balance-Leahy Scale: Poor ?Sitting balance - Comments: pt with intermittent R lean, much improved from next session as she is able to self-correct ?Postural control: Right lateral lean ?Standing balance support: Bilateral upper extremity supported, Reliant on assistive device for balance ?Standing balance-Leahy Scale: Poor ?Standing balance comment: min-modA ?  ?  ?  ?  ?  ?  ?  ?  ?  ?  ?  ?  ? ?  ?Cognition Arousal/Alertness: Awake/alert ?  Behavior During Therapy: [facility] for tasks assessed/performed ?Overall Cognitive Status: Impaired/Different from baseline ?Area of  Impairment: Awareness ?  ?  ?  ?  ?  ?  ?  ?  ?  ?  ?  ?  ?  ?Awareness: Emergent ?  ?  ?  ?  ? ?  ?Exercises General Exercises - Lower Extremity ?Ankle Circles/Pumps: AROM, Both, 10 reps ?Quad Sets: AROM, Right, 5 reps ?Gluteal Sets: AROM, Both, 5 reps ?Heel Slides: AROM, Right, 5 reps ? ?  ?General Comments General comments (skin integrity, edema, etc.): VSS on RA ?  ?  ? ?Pertinent Vitals/Pain Pain Assessment ?Pain Assessment: Faces ?Faces Pain Scale: Hurts little more ?Pain Location: R foot ?Pain Descriptors / Indicators: Grimacing ?Pain Intervention(s): Monitored during session  ? ? ?Home Living   ?  ?  ?  ?  ?  ?  ?  ?  ?  ?   ?  ?Prior Function    ?  ?  ?   ? ?PT Goals (current goals can now be found in the care plan section) Acute Rehab PT Goals ?Patient Stated Goal: to improve mobility and balance in an effort to reduce falls risk ?Progress towards PT goals: Progressing toward goals ? ?  ?Frequency ? ? ? Min 4X/week ? ? ? ?  ?PT Plan Current plan remains appropriate  ? ? ?Co-evaluation   ?  ?  ?  ?  ? ?  ?AM-PAC PT "6 Clicks" Mobility   ?Outcome Measure ? Help needed turning from your back to your side while in a flat bed without using bedrails?: A Little ?Help needed moving from lying on your back to sitting on the side of a flat bed without using bedrails?: A Little ?Help needed moving to and from a bed to a chair (including a wheelchair)?: A Lot ?Help needed standing up from a chair using your arms (e.g., wheelchair or bedside chair)?: A Lot ?Help needed to walk in hospital room?: Total ?Help needed climbing 3-5 steps with a railing? : Total ?6 Click Score: 12 ? ?  ?End of Session   ?Activity Tolerance: Patient tolerated treatment well ?Patient left: in chair;with call bell/phone within reach;with chair alarm set ?Nurse Communication: Mobility status ?PT Visit Diagnosis: Other abnormalities of gait and mobility (R26.89);Muscle weakness (generalized) (M62.81);Hemiplegia and hemiparesis;Other symptoms and  signs involving the nervous system (R29.898) ?Hemiplegia - Right/Left: Right ?Hemiplegia - dominant/non-dominant: Dominant ?Hemiplegia - caused by: Cerebral infarction ?  ? ? ?Time: [id number] ?PT Time Calculation (min) (ACUTE ONLY): 30 min ? ?Charges:  $Gait Training: 8-22 mins ?$Therapeutic Activity: 8-22 mins          ?          ? ?Zenaida Niece, PT, [name] ?Acute Rehabilitation ?Pager: [phone] ?Office [phone] ? ? ? ?Zenaida Niece ?01/02/2022, 9:57 AM ? ?

## 2022-01-02 NOTE — Progress Notes (Signed)
Inpatient Diabetes Program Recommendations ? ?AACE/ADA: New Consensus Statement on Inpatient Glycemic Control (2015) ? ?Target Ranges:  Prepandial:   less than 140 mg/dL ?     Peak postprandial:   less than 180 mg/dL (1-2 hours) ?     Critically ill patients:  140 - 180 mg/dL  ? ?Lab Results  ?Component Value Date  ? GLUCAP 235 (H) 01/02/2022  ? HGBA1C 7.1 (H) 12/29/2021  ? ? ?Review of Glycemic Control ? ?Diabetes history: DM2 ?Outpatient Diabetes medications: None ?Current orders for Inpatient glycemic control: Novolog 0-15 units TID ? ?Spoke with patient at bedside.  A1C was 12.7% in February 2022 and is now 7.1%.  Congratulated her on her bringing her glucose down.  She states she cut out sodas and started eating better.   ? ?She states her PCP placed her back on an injectable 2 weeks ago but cannot remember the name or how often to take as she has not filled the prescription.   ? ?She has been on Basaglar and Ozempic in the past.  Given her A1C of 7.1% she was likely prescribed Ozempic.   ? ?She is transferring to CIR and out team will follow.   ? ? ?Thank you, ?Dulce Sellar, MSN, RN ?Diabetes Coordinator ?Inpatient Diabetes Program ?[phone] (team pager from 8a-5p) ? ? ? ? ? ?

## 2022-01-02 NOTE — Progress Notes (Signed)
Inpatient Rehabilitation Admissions Coordinator  ? ?I have insurance approval and Cir bed to admit her to today. I notified Dr Pearlean Brownie. I have alerted acute team and TOC and will make the arrangements to admit today. ? ?Ottie Glazier, RN, MSN ?Rehab Admissions Coordinator ?([phone] ?01/02/2022 12:43 PM ? ?

## 2022-01-02 NOTE — Discharge Summary (Addendum)
Stroke Discharge Summary  ?Patient ID: Stacey Lucas    l ?  MRN: [id number]    ?  DOB: [birth year] ? ?Date of Admission: 12/29/2021 ?Date of Discharge: 01/02/2022 ? ?Attending Physician:  Antony Contras MD ?Consultant(s):   None ?Patient's PCP:  Fanny Bien, MD ? 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 ? ?Medications to be continued on Rehab ?Allergies as of 01/02/2022   ?No Known Allergies ?  ? ?  ?Medication List  ?  ? ?STOP taking these medications   ? ?blood glucose meter kit and supplies Kit ?  ?Pen Needles 32G X 4 MM Misc ?  ? ?  ? ?TAKE these medications   ? ?aspirin 81 MG EC tablet ?Take 1 tablet (81 mg total) by mouth daily. Swallow whole. ?Start taking on: January 03, 2022 ?  ?azelastine 0.1 % nasal spray ?Commonly known as: ASTELIN ?Place 1-2 sprays into both nostrils 2 (two) times daily as needed for allergies. ?  ?citalopram 10 MG tablet ?Commonly known as: CELEXA ?Take 1 tablet (10 mg total) by mouth daily. ?Start taking on: January 03, 2022 ?  ?clopidogrel 75 MG tablet ?Commonly known as: PLAVIX ?Take 1 tablet (75 mg total) by mouth daily. ?  ?diclofenac Sodium 1 % Gel ?Commonly known as: VOLTAREN ?Apply 4 g topically daily as needed (For knee pain). ?  ?insulin aspart 100 UNIT/ML injection ?Commonly known as: novoLOG ?Inject 0-20 Units into the skin 3 (three) times daily with meals. ?  ?insulin aspart 100 UNIT/ML injection ?Commonly known as: novoLOG ?Inject 0-5 Units into the skin at bedtime. ?  ?metoprolol succinate 25 MG 24 hr tablet ?Commonly known as: TOPROL-XL ?Take 25 mg by mouth daily. ?  ?rosuvastatin 20 MG tablet ?Commonly known as: CRESTOR ?Take 1 tablet (20 mg total) by mouth daily. ?Start taking on: January 03, 2022 ?What changed:  ?medication strength ?how much to take ?when  to take this ?  ?valsartan 160 MG tablet ?Commonly known as: DIOVAN ?Take 160 mg by mouth daily. ?  ? ?  ? ? ?LABORATORY STUDIES ?CBC ?   ?Component Value Date/Time  ? WBC 12.4 (H) 12/31/2021 1317  ? RBC 4.51 12/31/2021 1317  ? HGB 11.5 (L) 12/31/2021 1317  ? HCT 35.4 (L) 12/31/2021 1317  ? PLT 241 12/31/2021 1317  ? MCV 78.5 (L) 12/31/2021 1317  ? MCH 25.5 (L) 12/31/2021 1317  ? MCHC 32.5 12/31/2021 1317  ? RDW 15.5 12/31/2021 1317  ? LYMPHSABS 1.6 12/31/2021 1317  ? MONOABS 1.2 (H) 12/31/2021 1317  ? EOSABS 0.2 12/31/2021 1317  ? BASOSABS 0.1 12/31/2021 1317  ? ?CMP ?   ?Component Value Date/Time  ? NA 140 01/01/2022 0413  ? NA 127 (L) 11/27/2017 1708  ? K 3.9 01/01/2022 0413  ? CL 117 (H) 01/01/2022 0413  ? CO2 20 (L) 01/01/2022 0413  ? GLUCOSE 124 (H) 01/01/2022 0413  ? BUN 20 01/01/2022 0413  ? BUN 12 11/27/2017 1708  ? CREATININE 1.66 (H) 01/01/2022 0413  ? CALCIUM 8.2 (L) 01/01/2022 0413  ? PROT 4.2 (L) 12/29/2021 1217  ? PROT 6.6 11/27/2017 1708  ? ALBUMIN <1.5 (L) 12/29/2021 1217  ? ALBUMIN 4.2 11/27/2017 1708  ? AST 13 (L) 12/29/2021 1217  ? ALT 7 12/29/2021 1217  ? ALKPHOS 54 12/29/2021 1217  ?  BILITOT 0.6 12/29/2021 1217  ? BILITOT <0.2 11/27/2017 1708  ? GFRNONAA 38 (L) 01/01/2022 0413  ? GFRAA >60 04/14/2020 1335  ? ?COAGS ?Lab Results  ?Component Value Date  ? INR 1.0 01/01/2022  ? INR 1.0 12/29/2021  ? INR 1.1 05/23/2021  ? ?Lipid Panel ?   ?Component Value Date/Time  ? CHOL 202 (H) 12/30/2021 0305  ? TRIG 153 (H) 12/30/2021 0305  ? HDL 54 12/30/2021 0305  ? CHOLHDL 3.7 12/30/2021 0305  ? VLDL 31 12/30/2021 0305  ? [place] 117 (H) 12/30/2021 0305  ? ?HgbA1C  ?Lab Results  ?Component Value Date  ? HGBA1C 7.1 (H) 12/29/2021  ? ?Urinalysis ?   ?Component Value Date/Time  ? COLORURINE YELLOW 12/31/2021 1529  ? APPEARANCEUR CLEAR 12/31/2021 1529  ? LABSPEC 1.025 12/31/2021 1529  ? PHURINE 6.0 12/31/2021 1529  ? GLUCOSEU 100 (A) 12/31/2021 1529  ? HGBUR MODERATE (A) 12/31/2021 1529  ? [place] NEGATIVE  12/31/2021 1529  ? [place] NEGATIVE 12/31/2021 1529  ? PROTEINUR >300 (A) 12/31/2021 1529  ? NITRITE NEGATIVE 12/31/2021 1529  ? LEUKOCYTESUR MODERATE (A) 12/31/2021 1529  ? ?Urine Drug Screen  ?   ?Component Value Date/Time  ? [place] DETECTED 12/30/2021 2138  ? COCAINSCRNUR POSITIVE (A) 12/30/2021 2138  ? [place] DETECTED 12/30/2021 2138  ? AMPHETMU NONE DETECTED 12/30/2021 2138  ? [place] DETECTED 12/30/2021 2138  ? [place] DETECTED 12/30/2021 2138  ?  ?Alcohol Level ?   ?Component Value Date/Time  ? ETH <10 05/23/2021 1130  ? ? ? ?SIGNIFICANT DIAGNOSTIC STUDIES ?CT HEAD WO CONTRAST (5MM) ? ?Result Date: 12/31/2021 ?CLINICAL DATA:  Stroke, follow-up EXAM: CT HEAD WITHOUT CONTRAST TECHNIQUE: Contiguous axial images were obtained from the base of the skull through the vertex without intravenous contrast. RADIATION DOSE REDUCTION: This exam was performed according to the departmental dose-optimization program which includes automated exposure control, adjustment of the mA and/or kV according to patient size and/or use of iterative reconstruction technique. COMPARISON:  12/29/2021 FINDINGS: Brain: Evolving recent infarction involving the left corona radiata and basal ganglia. Chronic bilateral basal ganglia infarcts. Additional patchy hypoattenuation in the supratentorial white matter likely reflects nonspecific gliosis/demyelination. No acute intracranial hemorrhage or mass effect. Ventricles are normal in size. No extra-axial collection. Vascular: There is mild atherosclerotic calcification at the skull base. Skull: Calvarium is unremarkable. Sinuses/Orbits: No acute finding. Other: None. IMPRESSION: Evolving recent infarction of left corona radiata and basal ganglia. No acute intracranial hemorrhage. Chronic infarcts and chronic microvascular ischemic changes. Electronically Signed   By: Macy Mis M.D.   On: 12/31/2021 10:52  ? ?CT HEAD WO CONTRAST (5MM) ? ?Result Date: 12/08/2021 ?CLINICAL  DATA:  Headache, chronic, no new features EXAM: CT HEAD WITHOUT CONTRAST TECHNIQUE: Contiguous axial images were obtained from the base of the skull through the vertex without intravenous contrast. RADIATION DOSE REDUCTION: This exam was performed according to the departmental dose-optimization program which includes automated exposure control, adjustment of the mA and/or kV according to patient size and/or use of iterative reconstruction technique. COMPARISON:  05/23/2021 FINDINGS: Brain: No evidence of acute infarction, hemorrhage, hydrocephalus, extra-axial collection or mass lesion/mass effect. Chronic bilateral basal ganglia lacunar infarcts. Mild scattered low-density changes within the periventricular and subcortical white matter compatible with chronic microvascular ischemic change. Vascular: No hyperdense vessel or unexpected calcification. Skull: Normal. Negative for fracture or focal lesion. Sinuses/Orbits: No acute finding. Other: None. IMPRESSION: 1. No acute intracranial abnormality. 2. Mild chronic microvascular ischemic change. Electronically Signed  By: Davina Poke D.O.   On: 12/08/2021 13:46  ? ?MR BRAIN WO CONTRAST ? ?Result Date: 12/30/2021 ?CLINICAL DATA:  Provided history: Stroke, follow-up. EXAM: MRI HEAD WITHOUT CONTRAST TECHNIQUE: Multiplanar, multiecho pulse sequences of the brain and surrounding structures were obtained without intravenous contrast. COMPARISON:  CT angiogram head/neck 12/29/2021. Noncontrast head CT 12/29/2021. Brain MRI 05/23/2021. FINDINGS: Brain: The patient was unable to tolerate the full examination. As a result, axial T1 weighted and coronal T2 TSE sequences could not be obtained. The acquired sequences are intermittently motion degraded. Most notably, there is severe motion degradation of the axial SWI sequence. No age advanced or lobar predominant parenchymal atrophy. 2.4 x 1.1 x 2.0 cm acute infarct within the left corona radiata/basal ganglia. Some residual  diffusion weighted hyperintensity at site of a chronic infarct within the right corona radiata. Chronic lacunar infarcts within the bilateral corona radiata/deep gray nuclei. Moderate (and significantl

## 2022-01-03 DIAGNOSIS — I6330 Cerebral infarction due to thrombosis of unspecified cerebral artery: Secondary | ICD-10-CM

## 2022-01-03 LAB — CBC WITH DIFFERENTIAL/PLATELET
Abs Immature Granulocytes: 0.1 10*3/uL — ABNORMAL HIGH (ref 0.00–0.07)
Basophils Absolute: 0.1 10*3/uL (ref 0.0–0.1)
Basophils Relative: 1 %
Eosinophils Absolute: 0.5 10*3/uL (ref 0.0–0.5)
Eosinophils Relative: 6 %
HCT: 29 % — ABNORMAL LOW (ref 36.0–46.0)
Hemoglobin: 9.6 g/dL — ABNORMAL LOW (ref 12.0–15.0)
Immature Granulocytes: 1 %
Lymphocytes Relative: 32 %
Lymphs Abs: 2.9 10*3/uL (ref 0.7–4.0)
MCH: 25.8 pg — ABNORMAL LOW (ref 26.0–34.0)
MCHC: 33.1 g/dL (ref 30.0–36.0)
MCV: 78 fL — ABNORMAL LOW (ref 80.0–100.0)
Monocytes Absolute: 0.9 10*3/uL (ref 0.1–1.0)
Monocytes Relative: 10 %
Neutro Abs: 4.5 10*3/uL (ref 1.7–7.7)
Neutrophils Relative %: 50 %
Platelets: 202 10*3/uL (ref 150–400)
RBC: 3.72 MIL/uL — ABNORMAL LOW (ref 3.87–5.11)
RDW: 15.2 % (ref 11.5–15.5)
WBC: 9 10*3/uL (ref 4.0–10.5)
nRBC: 0 % (ref 0.0–0.2)

## 2022-01-03 LAB — COMPREHENSIVE METABOLIC PANEL
ALT: 11 U/L (ref 0–44)
AST: 28 U/L (ref 15–41)
Albumin: <1.5 g/dL — ABNORMAL LOW (ref 3.5–5.0)
Alkaline Phosphatase: 51 U/L (ref 38–126)
Anion gap: 5 (ref 5–15)
BUN: 17 mg/dL (ref 6–20)
CO2: 20 mmol/L — ABNORMAL LOW (ref 22–32)
Calcium: 7.8 mg/dL — ABNORMAL LOW (ref 8.9–10.3)
Chloride: 116 mmol/L — ABNORMAL HIGH (ref 98–111)
Creatinine, Ser: 1.62 mg/dL — ABNORMAL HIGH (ref 0.44–1.00)
GFR, Estimated: 39 mL/min — ABNORMAL LOW (ref >60)
Glucose, Bld: 130 mg/dL — ABNORMAL HIGH (ref 70–99)
Potassium: 4 mmol/L (ref 3.5–5.1)
Sodium: 141 mmol/L (ref 135–145)
Total Bilirubin: 0.2 mg/dL — ABNORMAL LOW (ref 0.3–1.2)
Total Protein: 4.2 g/dL — ABNORMAL LOW (ref 6.5–8.1)

## 2022-01-03 LAB — GLUCOSE, CAPILLARY
Glucose-Capillary: 136 mg/dL — ABNORMAL HIGH (ref 70–99)
Glucose-Capillary: 126 mg/dL — ABNORMAL HIGH (ref 70–99)
Glucose-Capillary: 149 mg/dL — ABNORMAL HIGH (ref 70–99)
Glucose-Capillary: 215 mg/dL — ABNORMAL HIGH (ref 70–99)

## 2022-01-03 MED ORDER — GLIMEPIRIDE 2 MG PO TABS: 2.0000 mg | ORAL_TABLET | Freq: Every day | ORAL | Status: DC

## 2022-01-03 MED ORDER — GLIMEPIRIDE 1 MG PO TABS
1.0000 mg | ORAL_TABLET | Freq: Every day | ORAL | Status: DC
  Administered 2022-01-04 – 2022-01-07 (×4): 1 mg via ORAL
  Filled 2022-01-03 (×4): qty 1

## 2022-01-03 NOTE — Progress Notes (Signed)
Inpatient Rehabilitation Care Coordinator ?Assessment and Plan ?Patient Details  ?Name: Stacey Lucas ?MRN: [id number] ?Date of Birth: 07/01/1976 ? ?Today's Date: 01/03/2022 ? ?Hospital Problems: Principal Problem: ?  CVA (cerebral vascular accident) ([place]) ? ?Past Medical History:  ?Past Medical History:  ?Diagnosis Date  ? Anxiety and depression   ? Cocaine abuse ([place])   ? Depression with suicidal ideation   ? [facility] admission 04/2018  ? Diabetes mellitus without complication ([place])   ? Type II  ? GERD (gastroesophageal reflux disease)   ? Hyperlipidemia   ? Hypertension   ? Impingement syndrome of right shoulder   ? Migraine headache   ? Osteoarthritis of right knee 05/24/2021  ? Pilonidal abscess 05/2013  ? Pyelonephritis   ? Right thyroid nodule   ? Sciatica   ? ?Past Surgical History:  ?Past Surgical History:  ?Procedure Laterality Date  ? BUBBLE STUDY  01/01/2022  ? Procedure: BUBBLE STUDY;  Surgeon: Janina Mayo, MD;  Location: [facility];  Service: Cardiovascular;;  ? TEE WITHOUT CARDIOVERSION N/A 01/01/2022  ? Procedure: TRANSESOPHAGEAL ECHOCARDIOGRAM (TEE);  Surgeon: Janina Mayo, MD;  Location: [facility] ENDOSCOPY;  Service: Cardiovascular;  Laterality: N/A;  ? tubal    ? ?Social History:  reports that she has been smoking cigarettes. She has been smoking an average of 1 pack per day. She has never used smokeless tobacco. She reports current alcohol use. She reports that she does not use drugs. ? ?Family / Support Systems ?Marital Status: Single ?Patient Roles: Partner ?Children: Oley Balm (Daughter), Berta Minor (Daughter) ?Other Supports: Marjory Lies (Friend) ?Anticipated Caregiver: Daughter, boyfriend and his sisters ?Ability/Limitations of Caregiver: Marjory Lies works, sister Oley Balm) able to assist. Berta Minor (unreliableO ?Caregiver Availability: Intermittent ?Family Dynamics: Support from boyfriend, children and other family members ? ?Social History ?Preferred language: English ?Religion: None ?Cultural Background:  Baptist ?Education: HS ?Health Literacy - How often do you need to have someone help you when you read instructions, pamphlets, or other written material from your doctor or pharmacy?: Never ?Writes: Yes ?Employment Status: Disabled ?Legal History/Current Legal Issues: n/a ?Guardian/Conservator: n/a  ? ?Abuse/Neglect ?Abuse/Neglect Assessment Can Be Completed: Yes ?Physical Abuse: Denies ?Verbal Abuse: Denies ?Sexual Abuse: Denies ?Exploitation of patient/patient's resources: Denies ?Self-Neglect: Denies ? ?Patient response to: ?Social Isolation - How often do you feel lonely or isolated from those around you?: Never ? ?Emotional Status ?Recent Psychosocial Issues: coping,hx of anxiety and depression, depression with suicidal ideation ?Psychiatric History: hx of anxiety and depression, depression with suicidal ideation ?Substance Abuse History: Cocaine Positive ? ?Patient / Family Perceptions, Expectations & Goals ?Pt/Family understanding of illness & functional limitations: yes ?Premorbid pt/family roles/activities: previously MOD I with RW (2 months) ?Anticipated changes in roles/activities/participation: Boyfriend and his sisters able to assist with roles and tasks ?Pt/family expectations/goals: Supervision to Min A ? ?Community Resources ?Community Agencies: None ?Premorbid Home Care/DME Agencies: Other (Comment) ([facility], [facility]) ?Transportation available at discharge: Family able to transport ?Is the patient able to respond to transportation needs?: Yes ?In the past 12 months, has lack of transportation kept you from medical appointments or from getting medications?: No ?In the past 12 months, has lack of transportation kept you from meetings, work, or from getting things needed for daily living?: No ?Resource referrals recommended: Neuropsychology ? ?Discharge Planning ?Living Arrangements: Children, Spouse/significant other ?Support Systems: Children ?Type of Residence: Private residence ?Insurance  Resources: Multimedia programmer (specify) (State BCBS of [place]) ?Financial Resources: Family Support, SSD ?Financial Screen Referred: No ?Living Expenses: Rent ?Money Management: Patient, Family ?Does  the patient have any problems obtaining your medications?: No ?Home Management: Independent ?Patient/Family Preliminary Plans: Daughter and boyfriend able to assist if unable to complete independently ?Care Coordinator Barriers to Discharge: Insurance for SNF coverage ?Care Coordinator Barriers to Discharge Comments: Marjory Lies works Company secretary, daughter Oley Balm able to assist. Patient boyfriend and his sisters will be primary caregivers. Youngest daughter can be unreliable. ?Care Coordinator Anticipated Follow Up Needs: HH/OP ?Expected length of stay: 14-20 days ? ?Clinical Impression ?SW met with patient introduced self and explained role. The patient reports that she will me discharging home primarily with intermittent assistance. The patient reports that her boyfriend and children work during [employer] but received assistance from her boyfriend his sisters and daughter. No additional questions or concerns, sw will continue to follow up. ? ?Dyanne Iha ?01/03/2022, 12:38 PM ? ?  ?

## 2022-01-03 NOTE — Evaluation (Signed)
Speech Language Pathology Assessment and Plan ? ?Patient Details  ?Name: Stacey Lucas ?MRN: [id number] ?Date of Birth: 09-22-1975 ? ?SLP Diagnosis: Dysarthria;Cognitive Impairments  ?Rehab Potential: Good ?ELOS: 2-2.5 weeks  ? ? ?Today's Date: 01/03/2022 ?SLP Individual Time: 0802-0900 ?SLP Individual Time Calculation (min): 58 min ? ? ?Hospital Problem: Principal Problem: ?  CVA (cerebral vascular accident) ([facility]) ? ?Past Medical History:  ?Past Medical History:  ?Diagnosis Date  ? Anxiety and depression   ? Cocaine abuse ([facility])   ? Depression with suicidal ideation   ? [facility] admission 04/2018  ? Diabetes mellitus without complication ([facility])   ? Type II  ? GERD (gastroesophageal reflux disease)   ? Hyperlipidemia   ? Hypertension   ? Impingement syndrome of right shoulder   ? Migraine headache   ? Osteoarthritis of right knee 05/24/2021  ? Pilonidal abscess 05/2013  ? Pyelonephritis   ? Right thyroid nodule   ? Sciatica   ? ?Past Surgical History:  ?Past Surgical History:  ?Procedure Laterality Date  ? BUBBLE STUDY  01/01/2022  ? Procedure: BUBBLE STUDY;  Surgeon: Maisie Fus, MD;  Location: [facility] ENDOSCOPY;  Service: Cardiovascular;;  ? TEE WITHOUT CARDIOVERSION N/A 01/01/2022  ? Procedure: TRANSESOPHAGEAL ECHOCARDIOGRAM (TEE);  Surgeon: Maisie Fus, MD;  Location: [facility] ENDOSCOPY;  Service: Cardiovascular;  Laterality: N/A;  ? tubal    ? ? ?Assessment / Plan / Recommendation ?Clinical Impression  Stacey Lucas is a 46 year old female with history of uncontrolled T2DM with neuropathy/RLE weakness, CVA 09/22, thyroid nodule, depression, ongoing cocaine abuse who was admitted on 12/29/21 with left sided weakness, left facial weakness and slurred speech which resolved transiently but then developed near flaccidity of RUE, RLE>LLE drift, slurred speech and right facial droop while in ED. UDS positive for cocaine. CTA head/neck was negative for LVO, showed moderate to severe focal stenosis in mid left M2 MCA vessel as well as  right thyroid nodule. Elevated BP treated with IV labetalol/clevidipine and she received TNK.   Follow up MRI brain revealed acute infarct in left corona radiata and basal ganglia with chronic small vessel infarcts in bilateral CR/BG.  2D echo showed EF 70-75% with mild LVH and follow up TEE showed EF 60-65% with negative bubble study, no intra-atrial shunt or cardiac thrombus.  ?EEG negative for seizures. Hospital course significant for Acute on chronic renal failure with rise in SCr to 1.66 as well as leucocytosis due to strep agalactiae UTI. She was started on ceftriaxone X 3 doses today.  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 Pearlean Brownie recommended DAPT X 3 weeks followed by ASA alone. She has also been educated on cocaine and tobacco cessation. To follow up with PCP for evaluation of thyroid nodule. She continues to be limited by right sided weakness with left inattention (from prior stroke?), right lean as well as right knee instability with standing attempts. CIR recommended due to functional decline. Currently complains of low back pain. ?  ?Pt presents with acute mild dysarthria and word finding deficits. Pt demonstrated 80-85% speech intelligibility in conversation (intelligibility increased once upper denture plate was applied) with fast rate and miscalculation noted in especially 3+ syllable words. Pt self rated acute changes in speech intelligibility ranging from 40% to 100% with fluctuating performance throughout the day, but overall steady improvement. Pt demonstrated WFL in oral motor function. Pt demonstrated mild impairments in confrontation naming with 90% accuracy in boston naming short test, 100% accuracy in convergent naming, but  limited divergent naming (named 9 animals and 1 word that's started with the letter /m/ within a minute timeframe.) Pt demonstrated ability to follow 3 step commands and repeat at sentence level. Pt supports a baseline of cognitive impairments in  mildly complex problem solving and memory skills with limited strategies to compensate for deficits. Pt was managing medication/time/money and expressed limited family support. Pt supports no impairments in swallow function. Pt would benefit from skilled ST services in order to maximize functional independence and reduce burden of care, likely requiring intermittent supervision for IADLs at discharge with continued skilled ST services. ? ?  ?Skilled Therapeutic Interventions          Skilled ST services focused on cognitive and language skills. SLP facilitated administration of cognitive linguistic formal assessment and provided education of results. SLP and pt collaborated to set goals for cognitive linguistic needs during length of stay. All questions answered to satisfaction.  Pt was left in room with call bell within reach and bed alarm set. SLP recommends to continue skilled services.  ?  ?SLP Assessment ? Patient will need skilled Speech Lanaguage Pathology Services during CIR admission  ?  ?Recommendations ? Recommendations for Other Services: Neuropsych consult ?Patient destination: Home ?Follow up Recommendations: Home Health SLP;Other (comment) (intermittment supervision) ?Equipment Recommended: None recommended by SLP  ?  ?SLP Frequency 3 to 5 out of 7 days   ?SLP Duration ? ?SLP Intensity ? ?SLP Treatment/Interventions 2-2.5 weeks ? ?Minumum of 1-2 x/day, 30 to 90 minutes ? ?Cognitive remediation/compensation;Cueing hierarchy;Functional tasks;Medication managment;Patient/family education;Speech/Language facilitation;Internal/external aids   ? ?Pain ?Pain Assessment ?Pain Scale: 0-10 ?Pain Score: 0-No pain ? ?Prior Functioning ?Cognitive/Linguistic Baseline: Baseline deficits ?Baseline deficit details: chart states "cognitive deficits" ?Type of Home: Apartment ? Lives With: Family ?Available Help at Discharge: Other (Comment) ? ?SLP Evaluation ?Cognition ?Overall Cognitive Status: No family/caregiver  present to determine baseline cognitive functioning ?Arousal/Alertness: Awake/alert ?Orientation Level: Oriented to person;Oriented to place;Oriented to situation ?Attention: Sustained;Selective ?Sustained Attention: Appears intact ?Selective Attention: Appears intact ?Memory: Impaired ?Memory Impairment: Decreased recall of new information;Retrieval deficit ?Awareness: Appears intact ?Problem Solving: Impaired ?Problem Solving Impairment: Functional basic;Verbal complex ?Safety/Judgment: Appears intact ?Comments: Required cues for problem solving compensatory techniques during ADL.  ?Comprehension ?Auditory Comprehension ?Overall Auditory Comprehension: Appears within functional limits for tasks assessed ?Yes/No Questions: Within Functional Limits ?Commands: Within Functional Limits ?One Step Basic Commands: 75-100% accurate ?Two Step Basic Commands: 75-100% accurate ?Conversation: Simple ?Expression ?Expression ?Primary Mode of Expression: Verbal ?Verbal Expression ?Overall Verbal Expression: Impaired ?Initiation: No impairment ?Level of Generative/Spontaneous Verbalization: Conversation ?Repetition: No impairment ?Naming: Impairment ?Confrontation: Impaired (10/11 on boston naming) ?Convergent: 75-100% accurate ?Divergent: 25-49% accurate ?Verbal Errors: Aware of errors ?Pragmatics: No impairment ?Written Expression ?Dominant Hand: Right ?Oral Motor ?Oral Motor/Sensory Function ?Overall Oral Motor/Sensory Function: Within functional limits ?Lingual Symmetry: Within Functional Limits ?Motor Speech ?Overall Motor Speech: Impaired ?Respiration: Within functional limits ?Level of Impairment: Conversation ?Phonation: Normal ?Articulation: Impaired ?Level of Impairment: Conversation ?Intelligibility: Intelligibility reduced ?Word: 75-100% accurate ?Phrase: 75-100% accurate ?Sentence: 75-100% accurate ?Conversation: 75-100% accurate ?Motor Planning: Witnin functional limits ? ?Care Tool ?Care Tool Cognition ?Ability to  hear (with hearing aid or hearing appliances if normally used Ability to hear (with hearing aid or hearing appliances if normally used): 0. Adequate - no difficulty in normal conservation, social interaction,

## 2022-01-03 NOTE — Progress Notes (Signed)
Inpatient Rehabilitation Center ?Individual Statement of Services ? ?Patient Name:  Stacey Lucas  ?Date:  01/03/2022 ? ?Welcome to the Inpatient Rehabilitation Center.  Our goal is to provide you with an individualized program based on your diagnosis and situation, designed to meet your specific needs.  With this comprehensive rehabilitation program, you will be expected to participate in at least 3 hours of rehabilitation therapies Monday-Friday, with modified therapy programming on the weekends. ? ?Your rehabilitation program will include the following services:  Physical Therapy (PT), Occupational Therapy (OT), Speech Therapy (ST), 24 hour per day rehabilitation nursing, Therapeutic Recreaction (TR), Neuropsychology, Care Coordinator, Rehabilitation Medicine, Nutrition Services, Pharmacy Services, and Other ? ?Weekly team conferences will be held on Wednesdays to discuss your progress.  Your Inpatient Rehabilitation Care Coordinator will talk with you frequently to get your input and to update you on team discussions.  Team conferences with you and your family in attendance may also be held. ? ?Expected length of stay:  14-20 Days  Overall anticipated outcome:  Supervision to Min A ? ?Depending on your progress and recovery, your program may change. Your Inpatient Rehabilitation Care Coordinator will coordinate services and will keep you informed of any changes. Your Inpatient Rehabilitation Care Coordinator's name and contact numbers are listed  below. ? ?The following services may also be recommended but are not provided by the Inpatient Rehabilitation Center:  ? ?Home Health Rehabiltiation Services ?Outpatient Rehabilitation Services ? ?  ?Arrangements will be made to provide these services after discharge if needed.  Arrangements include referral to agencies that provide these services. ? ?Your insurance has been verified to be:   [employer] ?Your primary doctor is:   Maryelizabeth Rowan, MD ? ?Pertinent  information will be shared with your doctor and your insurance company. ? ?Inpatient Rehabilitation Care Coordinator:  Lavera Guise, [place] [phone] or ([phone] ? ?Information discussed with and copy given to patient by: Andria Rhein, 01/03/2022, 11:14 AM    ?

## 2022-01-03 NOTE — Evaluation (Signed)
Physical Therapy Assessment and Plan ? ?Patient Details  ?Name: Stacey Lucas ?MRN: [id number] ?Date of Birth: 1975-11-05 ? ?PT Diagnosis: Abnormality of gait, Hemiplegia dominant, Impaired sensation, and Muscle weakness ?Rehab Potential: Good ?ELOS: 15-18 days  ? ?Today's Date: 01/03/2022 ?PT Individual Time: 1400-1510 ?    ? ?Hospital Problem: Principal Problem: ?  CVA (cerebral vascular accident) (HCC) ? ? ?Past Medical History:  ?Past Medical History:  ?Diagnosis Date  ? Anxiety and depression   ? Cocaine abuse (HCC)   ? Depression with suicidal ideation   ? [facility] admission 04/2018  ? Diabetes mellitus without complication (HCC)   ? Type II  ? GERD (gastroesophageal reflux disease)   ? Hyperlipidemia   ? Hypertension   ? Impingement syndrome of right shoulder   ? Migraine headache   ? Osteoarthritis of right knee 05/24/2021  ? Pilonidal abscess 05/2013  ? Pyelonephritis   ? Right thyroid nodule   ? Sciatica   ? ?Past Surgical History:  ?Past Surgical History:  ?Procedure Laterality Date  ? BUBBLE STUDY  01/01/2022  ? Procedure: BUBBLE STUDY;  Surgeon: Maisie Fus, MD;  Location: [facility] ENDOSCOPY;  Service: Cardiovascular;;  ? TEE WITHOUT CARDIOVERSION N/A 01/01/2022  ? Procedure: TRANSESOPHAGEAL ECHOCARDIOGRAM (TEE);  Surgeon: Maisie Fus, MD;  Location: [facility] ENDOSCOPY;  Service: Cardiovascular;  Laterality: N/A;  ? tubal    ? ? ?Assessment & Plan ?Clinical Impression: Patient is a 46 year old female with history of uncontrolled T2DM with neuropathy/RLE weakness, CVA 09/22, thyroid nodule, depression, ongoing cocaine abuse who was admitted on 12/29/21 with left sided weakness, left facial weakness and slurred speech which resolved transiently but then developed near flaccidity of RUE, RLE>LLE drift, slurred speech and right facial droop while in ED. UDS positive for cocaine. CTA head/neck was negative for LVO, showed moderate to severe focal stenosis in mid left M2 MCA vessel as well as right thyroid nodule. Elevated  BP treated with IV labetalol/clevidipine and she received TNK.   Follow up MRI brain revealed acute infarct in left corona radiata and basal ganglia with chronic small vessel infarcts in bilateral CR/BG.  2D echo showed EF 70-75% with mild LVH and follow up TEE showed EF 60-65% with negative bubble study, no intra-atrial shunt or cardiac thrombus.  ?  ?EEG negative for seizures. Hospital course significant for Acute on chronic renal failure with rise in SCr to 1.66 as well as leucocytosis due to strep agalactiae UTI. She was started on ceftriaxone X 3 doses today.  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 Pearlean Brownie recommended DAPT X 3 weeks followed by ASA alone. She has also been educated on cocaine and tobacco cessation. To follow up with PCP for evaluation of thyroid nodule. She continues to be limited by right sided weakness with left inattention (from prior stroke?), right lean as well as right knee instability with standing attempts.   Patient transferred to CIR on 01/02/2022 .  ? ?Patient currently requires mod with mobility secondary to muscle weakness, muscle joint tightness, and muscle paralysis, decreased cardiorespiratoy endurance, unbalanced muscle activation and decreased coordination, and decreased sitting balance, decreased standing balance, decreased postural control, hemiplegia, and decreased balance strategies.  Prior to hospitalization, patient was modified independent  with mobility and lived with Family (boyfriend and daughter, Desma Maxim live with patient, both work) in a Apartment home.  Home access is  Level entry. ? ?Patient will benefit from skilled PT intervention to maximize safe functional mobility, minimize fall  risk, and decrease caregiver burden for planned discharge home with 24 hour supervision.  Anticipate patient will benefit from follow up OP at discharge. ? ?PT - End of Session ?Activity Tolerance: Tolerates 10 - 20 min activity with multiple rests ?Endurance  Deficit: Yes ?PT Assessment ?Rehab Potential (ACUTE/IP ONLY): Good ?PT Barriers to Discharge: Decreased caregiver support;Home environment access/layout;Insurance for SNF coverage ?PT Patient demonstrates impairments in the following area(s): Balance;Behavior;Edema;Endurance;Motor;Sensory ?PT Transfers Functional Problem(s): Bed Mobility;Bed to Chair;Car;Furniture;Floor ?PT Locomotion Functional Problem(s): Ambulation;Wheelchair Mobility;Stairs ?PT Plan ?PT Intensity: Minimum of 1-2 x/day ,45 to 90 minutes ?PT Frequency: 5 out of 7 days ?PT Duration Estimated Length of Stay: 15-18 days ?PT Treatment/Interventions: Ambulation/gait training;Discharge planning;Psychosocial support;Functional mobility training;Therapeutic Activities;Visual/perceptual remediation/compensation;Balance/vestibular training;Disease management/prevention;Skin care/wound management;Neuromuscular re-education;Therapeutic Exercise;Wheelchair propulsion/positioning;Cognitive remediation/compensation;Pain management;DME/adaptive equipment instruction;Splinting/orthotics;UE/LE Strength taining/ROM;Functional electrical stimulation;Community reintegration;Patient/family education;Stair training;UE/LE Coordination activities ?PT Transfers Anticipated Outcome(s): superivsion assist with LRAD ?PT Locomotion Anticipated Outcome(s): Ambulatory with RW supevision assist ?PT Recommendation ?Recommendations for Other Services: Therapeutic Recreation consult ?Therapeutic Recreation Interventions: Stress management;Outing/community reintergration ?Follow Up Recommendations: Outpatient PT ?Patient destination: Home ?Equipment Recommended: To be determined ? ? ?PT Evaluation ?Precautions/Restrictions ?Precautions ?Precautions: Fall ?Precaution Comments: R charcot foot, R HP ?Restrictions ?Weight Bearing Restrictions: No ?General ?  Vital SignsTherapy Vitals ?Temp: 98.6 ?F (37 ?C) ?Temp Source: Oral ?Pulse Rate: 78 ?Resp: 18 ?BP: (!) 167/81 ?Patient Position (if  appropriate): Lying ?Oxygen Therapy ?SpO2: 100 % ?O2 Device: Room Air ?Pain ?Pain Assessment ?Pain Scale: 0-10 ?Pain Score: 0-No pain ?Pain Interference ?Pain Interference ?Pain Effect on Sleep: 1. Rarely or not at all ?Pain Interference with Therapy Activities: 1. Rarely or not at all ?Pain Interference with Day-to-Day Activities: 1. Rarely or not at all ?Home Living/Prior Functioning ?Home Living ?Available Help at Discharge: Other (Comment) (PRN from family) ?Type of Home: Apartment ?Home Access: Level entry ?Home Layout: One level ?Bathroom Shower/Tub: Tub/shower unit ?Bathroom Toilet: Standard ?Bathroom Accessibility: Yes ? 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 ?Vision/Perception  ?Vision - History ?Ability to See in Adequate Light: 0 Adequate ?Vision - Assessment ?Eye Alignment: Within Functional Limits ?Ocular Range of Motion: Within Functional Limits ?Alignment/Gaze Preference: Within Defined Limits ?Tracking/Visual Pursuits: Able to track stimulus in all quads without difficulty ?Saccades: Within functional limits ?Convergence: Within functional limits ?Perception ?Perception: Within Functional Limits ?Praxis ?Praxis: Intact  ?Cognition ?Overall Cognitive Status: No family/caregiver present to determine baseline cognitive functioning ?Arousal/Alertness: Awake/alert ?Orientation Level: Oriented to person;Oriented to place;Oriented to situation ?Attention: Sustained;Selective ?Sustained Attention: Appears intact ?Selective Attention: Appears intact ?Memory: Impaired ?Memory Impairment: Decreased recall of new information;Retrieval deficit ?Awareness: Appears intact ?Problem Solving: Impaired ?Problem Solving Impairment: Functional basic;Verbal complex ?Safety/Judgment:  Appears intact ?Comments: Required cues for problem solving compensatory techniques during ADL. ?Sensation ?Sensation ?Light Touch: Appears Intact ?Hot/Cold: Appears Intact ?Proprioception: Impaired Deta

## 2022-01-03 NOTE — Plan of Care (Signed)
?  Problem: RH Balance ?Goal: LTG Patient will maintain dynamic sitting balance (PT) ?Description: LTG:  Patient will maintain dynamic sitting balance with assistance during mobility activities (PT) ?Flowsheets (Taken 01/03/2022 1543) ?LTG: Pt will maintain dynamic sitting balance during mobility activities with:: Independent ?Goal: LTG Patient will maintain dynamic standing balance (PT) ?Description: LTG:  Patient will maintain dynamic standing balance with assistance during mobility activities (PT) ?Flowsheets (Taken 01/03/2022 1543) ?LTG: Pt will maintain dynamic standing balance during mobility activities with:: Supervision/Verbal cueing ?  ?Problem: RH Bed Mobility ?Goal: LTG Patient will perform bed mobility with assist (PT) ?Description: LTG: Patient will perform bed mobility with assistance, with/without cues (PT). ?Flowsheets (Taken 01/03/2022 1543) ?LTG: Pt will perform bed mobility with assistance level of: Independent with assistive device  ?  ?Problem: RH Bed to Chair Transfers ?Goal: LTG Patient will perform bed/chair transfers w/assist (PT) ?Description: LTG: Patient will perform bed to chair transfers with assistance (PT). ?Flowsheets (Taken 01/03/2022 1543) ?LTG: Pt will perform Bed to Chair Transfers with assistance level: Supervision/Verbal cueing ?  ?Problem: RH Car Transfers ?Goal: LTG Patient will perform car transfers with assist (PT) ?Description: LTG: Patient will perform car transfers with assistance (PT). ?Flowsheets (Taken 01/03/2022 1543) ?LTG: Pt will perform car transfers with assist:: Contact Guard/Touching assist ?  ?Problem: RH Furniture Transfers ?Goal: LTG Patient will perform furniture transfers w/assist (OT/PT) ?Description: LTG: Patient will perform furniture transfers  with assistance (OT/PT). ?Flowsheets (Taken 01/03/2022 1543) ?LTG: Pt will perform furniture transfers with assist:: Supervision/Verbal cueing ?  ?Problem: RH Ambulation ?Goal: LTG Patient will ambulate in controlled  environment (PT) ?Description: LTG: Patient will ambulate in a controlled environment, # of feet with assistance (PT). ?Flowsheets (Taken 01/03/2022 1543) ?LTG: Pt will ambulate in controlled environ  assist needed:: Supervision/Verbal cueing ?LTG: Ambulation distance in controlled environment: 169ft with LRAD ?Goal: LTG Patient will ambulate in home environment (PT) ?Description: LTG: Patient will ambulate in home environment, # of feet with assistance (PT). ?Flowsheets (Taken 01/03/2022 1543) ?LTG: Pt will ambulate in home environ  assist needed:: Supervision/Verbal cueing ?LTG: Ambulation distance in home environment: 47ft with LRAD ?  ?Problem: RH Wheelchair Mobility ?Goal: LTG Patient will propel w/c in controlled environment (PT) ?Description: LTG: Patient will propel wheelchair in controlled environment, # of feet with assist (PT) ?Flowsheets (Taken 01/03/2022 1543) ?LTG: Pt will propel w/c in controlled environ  assist needed:: Contact Guard/Touching assist ?LTG: Propel w/c distance in controlled environment: 149ft ?  ?Problem: RH Pre-functional/Other (Specify) ?Goal: RH LTG PT (Specify) 1 ?Description:  RH LTG PT (Specify) 1 ?Flowsheets (Taken 01/03/2022 1543) ?LTG: Other PT (Specify) 1: pt will improve Berg balance scale score to >35 to indicate improved function and safety with ADLs ?  ?

## 2022-01-03 NOTE — Evaluation (Signed)
Occupational Therapy Assessment and Plan ? ?Patient Details  ?Name: Stacey Lucas ?MRN: [id number] ?Date of Birth: 1976-02-11 ? ?OT Diagnosis: hemiplegia affecting dominant side and decreased postural control and activity tolerance ?Rehab Potential: Rehab Potential (ACUTE ONLY): Good ?ELOS: 2-2.5 weeks  ? ?Today's Date: 01/03/2022 ?OT Individual Time: 3295-1884 ?OT Individual Time Calculation (min): 54 min    ? ?Hospital Problem: Principal Problem: ?  CVA (cerebral vascular accident) (HCC) ? ? ?Past Medical History:  ?Past Medical History:  ?Diagnosis Date  ? Anxiety and depression   ? Cocaine abuse (HCC)   ? Depression with suicidal ideation   ? [facility] admission 04/2018  ? Diabetes mellitus without complication (HCC)   ? Type II  ? GERD (gastroesophageal reflux disease)   ? Hyperlipidemia   ? Hypertension   ? Impingement syndrome of right shoulder   ? Migraine headache   ? Osteoarthritis of right knee 05/24/2021  ? Pilonidal abscess 05/2013  ? Pyelonephritis   ? Right thyroid nodule   ? Sciatica   ? ?Past Surgical History:  ?Past Surgical History:  ?Procedure Laterality Date  ? BUBBLE STUDY  01/01/2022  ? Procedure: BUBBLE STUDY;  Surgeon: Maisie Fus, MD;  Location: [facility] ENDOSCOPY;  Service: Cardiovascular;;  ? TEE WITHOUT CARDIOVERSION N/A 01/01/2022  ? Procedure: TRANSESOPHAGEAL ECHOCARDIOGRAM (TEE);  Surgeon: Maisie Fus, MD;  Location: [facility] ENDOSCOPY;  Service: Cardiovascular;  Laterality: N/A;  ? tubal    ? ? ?Assessment & Plan ?Clinical Impression: Patient is a 46 y.o. year old female with  with history of uncontrolled T2DM with neuropathy/RLE weakness, CVA 09/22, thyroid nodule, depression, ongoing cocaine abuse who was admitted on 12/29/21 with left sided weakness, left facial weakness and slurred speech which resolved transiently but then developed near flaccidity of RUE, RLE>LLE drift, slurred speech and right facial droop while in ED. UDS positive for cocaine. CTA head/neck was negative for LVO, showed  moderate to severe focal stenosis in mid left M2 MCA vessel as well as right thyroid nodule. Elevated BP treated with IV labetalol/clevidipine and she received TNK.   Follow up MRI brain revealed acute infarct in left corona radiata and basal ganglia with chronic small vessel infarcts in bilateral CR/BG.  2D echo showed EF 70-75% with mild LVH and follow up TEE showed EF 60-65% with negative bubble study, no intra-atrial shunt or cardiac thrombus.  ?  ?EEG negative for seizures. Hospital course significant for Acute on chronic renal failure with rise in SCr to 1.66 as well as leucocytosis due to strep agalactiae UTI. She was started on ceftriaxone X 3 doses today.  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 Pearlean Brownie recommended DAPT X 3 weeks followed by ASA alone. She has also been educated on cocaine and tobacco cessation. To follow up with PCP for evaluation of thyroid nodule. She continues to be limited by right sided weakness with left inattention (from prior stroke?), right lean as well as right knee instability with standing attempts. CIR recommended due to functional decline. Currently complains of low back pain. ? .  Patient transferred to CIR on 01/02/2022 .   ? ?Patient currently requires  min to mod A  with basic self-care skills secondary to impaired timing and sequencing, unbalanced muscle activation, decreased coordination, and R HP  and decreased standing balance, decreased postural control, and decreased balance strategies.  Prior to hospitalization, patient could complete BADL/transfers with modified independent . ? ?Patient will benefit from skilled intervention to decrease level of  assist with basic self-care skills, increase independence with basic self-care skills, and increase level of independence with iADL prior to discharge home with care partner.  Anticipate patient will require intermittent supervision and follow up home health and follow up outpatient. ? ?OT - End of  Session ?Activity Tolerance: Tolerates 30+ min activity with multiple rests ?Endurance Deficit: Yes ?OT Assessment ?Rehab Potential (ACUTE ONLY): Good ?OT Barriers to Discharge: Decreased caregiver support;Inaccessible home environment;Home environment access/layout ?OT Patient demonstrates impairments in the following area(s): Balance;Endurance;Perception;Motor ?OT Basic ADL's Functional Problem(s): Eating;Grooming;Bathing;Dressing;Toileting ?OT Advanced ADL's Functional Problem(s): Simple Meal Preparation ?OT Transfers Functional Problem(s): Tub/Shower;Toilet ?OT Additional Impairment(s): Fuctional Use of Upper Extremity ?OT Plan ?OT Intensity: Minimum of 1-2 x/day, 45 to 90 minutes ?OT Frequency: 5 out of 7 days ?OT Duration/Estimated Length of Stay: 2-2.5 weeks ?OT Treatment/Interventions: Balance/vestibular training;Disease mangement/prevention;Neuromuscular re-education;Self Care/advanced ADL retraining;Therapeutic Exercise;Wheelchair propulsion/positioning;Pain management;DME/adaptive equipment instruction;Skin care/wound managment;UE/LE Strength taining/ROM;Community reintegration;Functional electrical stimulation;Patient/family education;Splinting/orthotics;UE/LE Coordination activities;Therapeutic Activities;Psychosocial support;Functional mobility training;Discharge planning ?OT Self Feeding Anticipated Outcome(s): mod I ?OT Basic Self-Care Anticipated Outcome(s): mod I to S ?OT Toileting Anticipated Outcome(s): mod I ?OT Bathroom Transfers Anticipated Outcome(s): S ?OT Recommendation ?Patient destination: Home ?Follow Up Recommendations: Outpatient OT;Home health OT ?Equipment Recommended: To be determined ? ? ?OT Evaluation ?Precautions/Restrictions  ?Precautions ?Precautions: Fall ?Precaution Comments: R charcot foot, R HP ?Restrictions ?Weight Bearing Restrictions: No ?General ?Chart Reviewed: Yes ?Response to Previous Treatment: Patient with no complaints from previous session ?Family/Caregiver  Present: No ? ?Pain ?Pain Assessment ?Pain Scale: 0-10 ?Pain Score: 0-No pain ?Home Living/Prior Functioning ?Home Living ?Family/patient expects to be discharged to:: Private residence ?Living Arrangements: Children, Spouse/significant other ?Available Help at Discharge: Other (Comment) (PRN from family) ?Type of Home: Apartment ?Home Access: Level entry ?Home Layout: One level ?Bathroom Shower/Tub: Tub/shower unit ?Bathroom Toilet: Standard ?Bathroom Accessibility: Yes ? 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 previous stroke ?Prior Function ?Level of Independence: Requires assistive device for independence ?Driving: Yes ?Vision ?Baseline Vision/History: 0 No visual deficits ?Ability to See in Adequate Light: 0 Adequate ?Patient Visual Report: No change from baseline ?Vision Assessment?: Yes ?Eye Alignment: Within Functional Limits ?Ocular Range of Motion: Within Functional Limits ?Alignment/Gaze Preference: Within Defined Limits ?Tracking/Visual Pursuits: Able to track stimulus in all quads without difficulty ?Saccades: Within functional limits ?Convergence: Within functional limits ?Visual Fields: No apparent deficits ?Perception  ?Perception: Within Functional Limits ?Praxis ?Praxis: Intact ?Cognition ?Cognition ?Overall Cognitive Status: No family/caregiver present to determine baseline cognitive functioning ?Arousal/Alertness: Awake/alert ?Orientation Level: Person;Place;Situation ?Person: Oriented ?Place: Oriented ?Situation: Oriented ?Memory: Appears  intact ?Attention: Sustained ?Sustained Attention: Appears intact ?Problem Solving: Impaired ?Problem Solving Impairment: Functional complex;Functional basic ?Safety/Judgment: Appears intact ?Comments: Required cues for problem

## 2022-01-03 NOTE — Progress Notes (Signed)
?                                                       PROGRESS NOTE ? ? ?Subjective/Complaints: ? ?No issues overnite, discussed labs ?Pt not working since prior RIght caudate infarct  in Sept 22 ? ? ?ROS- neg CP, SOB, N/V/D ?Objective: ?  ?ECHO TEE ? ?Result Date: 01/01/2022 ?   TRANSESOPHOGEAL ECHO REPORT   Patient Name:   Stacey Lucas Date of Exam: 01/01/2022 Medical Rec #:  [id number]    Height:       65.0 in Accession #:    [id number]   Weight:       153.0 lb Date of Birth:  1976/05/08   BSA:          1.765 m? Patient Age:    46 years     BP:           172/80 mmHg Patient Gender: F            HR:           87 bpm. Exam Location:  Inpatient Procedure: Transesophageal Echo, Cardiac Doppler, Color Doppler and Saline            Contrast Bubble Study Indications:     Stroke  History:         Patient has prior history of Echocardiogram examinations, most                  recent 12/30/2021. Arrythmias:Atrial Fibrillation; Risk                  Factors:Diabetes, Hypertension, Dyslipidemia and Current                  Smoker.  Sonographer:     Clayton Lefort RDCS (AE) Referring Phys:  [place] Diagnosing Phys: [place] PROCEDURE: After discussion of the risks and benefits of a TEE, an informed consent was obtained from the patient. The transesophogeal probe was passed without difficulty through the esophogus of the patient. Sedation performed by different physician. The patient was monitored while under deep sedation. Anesthestetic sedation was provided intravenously by Anesthesiology: 124.61mg  of Propofol, 60mg  of Lidocaine. Image quality was good. The patient developed no complications during the procedure. IMPRESSIONS  1. Left ventricular ejection fraction, by estimation, is 60 to 65%. The left ventricle has normal function.  2. Right ventricular systolic function is normal. The right ventricular size is normal.  3. No left atrial/left atrial appendage thrombus was detected. The LAA emptying velocity was 60  cm/s.  4. The mitral valve is normal in structure. No evidence of mitral valve regurgitation.  5. The aortic valve is normal in structure. Aortic valve regurgitation is not visualized.  6. There is mild (Grade II) plaque.  7. Agitated saline contrast bubble study was negative, with no evidence of any interatrial shunt. Conclusion(s)/Recommendation(s): No LA/LAA thrombus identified. Negative bubble study for interatrial shunt. No intracardiac source of embolism detected on this on this transesophageal echocardiogram. FINDINGS  Left Ventricle: Left ventricular ejection fraction, by estimation, is 60 to 65%. The left ventricle has normal function. The left ventricular internal cavity size was normal in size. Right Ventricle: The right ventricular size is normal. No increase in right ventricular wall thickness. Right ventricular systolic function is normal. Left  Atrium: No left atrial/left atrial appendage thrombus was detected. The LAA emptying velocity was 60 cm/s. Pericardium: There is no evidence of pericardial effusion. Mitral Valve: The mitral valve is normal in structure. No evidence of mitral valve regurgitation. Tricuspid Valve: The tricuspid valve is normal in structure. Tricuspid valve regurgitation is not demonstrated. Aortic Valve: The aortic valve is normal in structure. Aortic valve regurgitation is not visualized. Pulmonic Valve: The pulmonic valve was normal in structure. Pulmonic valve regurgitation is not visualized. Aorta: The aortic root and ascending aorta are structurally normal, with no evidence of dilitation. There is mild (Grade II) plaque. IAS/Shunts: Agitated saline contrast was given intravenously to evaluate for intracardiac shunting. Agitated saline contrast bubble study was negative, with no evidence of any interatrial shunt. Phineas Inches Electronically signed by Phineas Inches Signature Date/Time: 01/01/2022/11:57:28 AM    Final    ?Recent Labs  ?  12/31/21 ?1317 01/03/22 ?0602  ?WBC 12.4*  9.0  ?HGB 11.5* 9.6*  ?HCT 35.4* 29.0*  ?PLT 241 202  ? ?Recent Labs  ?  01/01/22 ?0413 01/03/22 ?0602  ?NA 140 141  ?K 3.9 4.0  ?CL 117* 116*  ?CO2 20* 20*  ?GLUCOSE 124* 130*  ?BUN 20 17  ?CREATININE 1.66* 1.62*  ?CALCIUM 8.2* 7.8*  ? ? ?Intake/Output Summary (Last 24 hours) at 01/03/2022 [id number] ?Last data filed at 01/03/2022 0749 ?Gross per 24 hour  ?Intake 342 ml  ?Output --  ?Net 342 ml  ?  ? ?  ? ?Physical Exam: ?Vital Signs ?Blood pressure 134/65, pulse 86, temperature 98.4 ?F (36.9 ?C), temperature source Oral, resp. rate 16, weight 70.1 kg, SpO2 100 %. ? ? ?General: No acute distress ?Mood and affect are appropriate ?Heart: Regular rate and rhythm no rubs murmurs or extra sounds ?Lungs: Clear to auscultation, breathing unlabored, no rales or wheezes ?Abdomen: Positive bowel sounds, soft nontender to palpation, nondistended ?Extremities: No clubbing, cyanosis, or edema ?Skin: No evidence of breakdown, no evidence of rash ?Neurologic: Dysarthria Cranial nerves II through XII intact, motor strength is 3/5 in Right and 4- Left  deltoid, bicep, tricep, grip, hip flexor, knee extensors, ankle dorsiflexor and plantar flexor ?Sensory exam normal sensation to light touch and proprioception in bilateral upper and lower extremities ?Cerebellar exam normal finger to nose to finger as well as heel to shin in bilateral upper and lower extremities ?Musculoskeletal: Full range of motion in all 4 extremities. No joint swelling ? ? ?Assessment/Plan: ?1. Functional deficits which require 3+ hours per day of interdisciplinary therapy in a comprehensive inpatient rehab setting. ?Physiatrist is providing close team supervision and 24 hour management of active medical problems listed below. ?Physiatrist and rehab team continue to assess barriers to discharge/monitor patient progress toward functional and medical goals ? ?Care Tool: ? ?Bathing ?   ?   ?   ?  ?  ?Bathing assist   ?  ?  ?Upper Body Dressing/Undressing ?Upper body  dressing   ?  ?   ?Upper body assist   ?   ?Lower Body Dressing/Undressing ?Lower body dressing ? ? ?   ?  ? ?  ? ?Lower body assist   ?   ? ?Toileting ?Toileting    ?Toileting assist Assist for toileting: Moderate Assistance - Patient 50 - 74% ?  ?  ?Transfers ?Chair/bed transfer ? ?Transfers assist ?   ? ?  ?  ?  ?Locomotion ?Ambulation ? ? ?Ambulation assist ? ?   ? ?  ?  ?   ? ?  Walk 10 feet activity ? ? ?Assist ?   ? ?  ?   ? ?Walk 50 feet activity ? ? ?Assist   ? ?  ?   ? ? ?Walk 150 feet activity ? ? ?Assist   ? ?  ?  ?  ? ?Walk 10 feet on uneven surface  ?activity ? ? ?Assist   ? ? ?  ?   ? ?Wheelchair ? ? ? ? ?Assist   ?  ?  ? ?  ?   ? ? ?Wheelchair 50 feet with 2 turns activity ? ? ? ?Assist ? ?  ?  ? ? ?   ? ?Wheelchair 150 feet activity  ? ? ? ?Assist ?   ? ? ?   ? ?Blood pressure 134/65, pulse 86, temperature 98.4 ?F (36.9 ?C), temperature source Oral, resp. rate 16, weight 70.1 kg, SpO2 100 %. ? ?Medical Problem List and Plan: ?1. 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            -patient may shower ?            -ELOS/Goals: 14-20 days S/MinA ?            Admit to CIR  ?2.  Antithrombotics: ?-DVT/anticoagulation:  Pharmaceutical: Lovenox ?            -antiplatelet therapy: DAPT X 3 weeks followed by ASA alone ?3. Pain Management: tylenol prn ?4. Mood: LCSW to follow for evaluation and support.  ?            -antipsychotic agents: N/a ?5. Neuropsych: This patient is capable of making decisions on her own behalf. ?6. Skin/Wound Care: routine pressure relief measures.  ?7. Fluids/Electrolytes/Nutrition: Monitor I/O. Check CMET in am. ?8. HTN: Monitor BP TID--continue Avapro and metoprolol ?            ?Vitals:  ? 01/02/22 1934 01/03/22 0511  ?BP: (!) 157/82 134/65  ?Pulse: 81 86  ?Resp: 16 16  ?Temp: 98.6 ?F (37 ?C) 98.4 ?F (36.9 ?C)  ?SpO2: 100% 100%  ?Some lability , cont to monitor  ? ?9.T2DM: Hgb A1C- 7.1 trending down as compliant with diet but  not meds.  ?            --monitor BS ac/hs and use SSI for elevated BS ?            --BS variable from 74 to 235.  ?--Per [facility]--no meds picked up since Oct/Rx for basiglar and ozempic not picked up yet.  ?-

## 2022-01-03 NOTE — Progress Notes (Signed)
Inpatient Rehabilitation  Patient information reviewed and entered into eRehab system by Alline Pio Thai Hemrick, OTR/L.   Information including medical coding, functional ability and quality indicators will be reviewed and updated through discharge.    

## 2022-01-03 NOTE — Plan of Care (Signed)
?  Problem: RH Expression Communication ?Goal: LTG Patient will increase speech intelligibility (SLP) ?Description: LTG: Patient will increase speech intelligibility at word/phrase/conversation level with cues, % of the time (SLP) ?Flowsheets (Taken 01/03/2022 1314) ?LTG: Patient will increase speech intelligibility (SLP): Modified Independent ?Level: Conversation level ?Percent of time patient will use intelligible speech: 90% ?Goal: LTG Patient will increase word finding of common (SLP) ?Description: LTG:  Patient will increase word finding of common objects/daily info/abstract thoughts with cues using compensatory strategies (SLP). ?Flowsheets (Taken 01/03/2022 1314) ?LTG: Patient will increase word finding of common (SLP): Supervision ?Patient will use compensatory strategies to increase word finding of: Abstract thoughts ?  ?Problem: RH Problem Solving ?Goal: LTG Patient will demonstrate problem solving for (SLP) ?Description: LTG:  Patient will demonstrate problem solving for basic/complex daily situations with cues  (SLP) ?Flowsheets (Taken 01/03/2022 1314) ?LTG: Patient will demonstrate problem solving for (SLP): (functional and mildly complex) Other (comment) ?LTG Patient will demonstrate problem solving for: Supervision ?  ?Problem: RH Memory ?Goal: LTG Patient will use memory compensatory aids to (SLP) ?Description: LTG:  Patient will use memory compensatory aids to recall biographical/new, daily complex information with cues (SLP) ?Flowsheets (Taken 01/03/2022 1314) ?LTG: Patient will use memory compensatory aids to (SLP): Supervision ?  ?

## 2022-01-04 DIAGNOSIS — I6300 Cerebral infarction due to thrombosis of unspecified precerebral artery: Secondary | ICD-10-CM | POA: Diagnosis not present

## 2022-01-04 LAB — GLUCOSE, CAPILLARY
Glucose-Capillary: 86 mg/dL (ref 70–99)
Glucose-Capillary: 87 mg/dL (ref 70–99)
Glucose-Capillary: 119 mg/dL — ABNORMAL HIGH (ref 70–99)
Glucose-Capillary: 183 mg/dL — ABNORMAL HIGH (ref 70–99)

## 2022-01-04 MED ORDER — METOPROLOL SUCCINATE ER 50 MG PO TB24
50.0000 mg | ORAL_TABLET | Freq: Every day | ORAL | Status: DC
  Administered 2022-01-05 – 2022-01-09 (×5): 50 mg via ORAL
  Filled 2022-01-04 (×5): qty 1

## 2022-01-04 NOTE — Progress Notes (Signed)
Occupational Therapy Session Note ? ?Patient Details  ?Name: Stacey Lucas ?MRN: [id number] ?Date of Birth: 1975-09-11 ? ?Today's Date: 01/04/2022 ?OT Individual Time: 8309-4076 ?OT Individual Time Calculation (min): 30 min  ? ? ?Short Term Goals: ?Week 1:  OT Short Term Goal 1 (Week 1): Pt will complete standing grooming task with min A. ?OT Short Term Goal 2 (Week 1): Pt will don pants with min A. ?OT Short Term Goal 3 (Week 1): Pt will complete toilet transfer min A and with LRAD. ? ?Skilled Therapeutic Interventions/Progress Updates:  ?   ?Pt received in bed with no pain reported initially then mild R shoulder pain with shoulder hike-TC given to decrease compensation/pain ? ?Therapeutic activity ?Box and blocks: RUE 20 LUE 37  ? ?Worked on stacking blocks with RUE with ~3 instances of bumping towers over reaching laterally, anteriorly and across midline seated with TC for no shoulder hike. Wored palm>finger translation with 3 blocks at a time with VC for no shoulder hike, resistive clothes pins with forced in hand manipulation and 3 point or key pinch.  ? ?SPT with RW with MIN A Overall with improved foot clearance getting back to bed.  ? ?Pt left at end of session in bed with exit alarm on, call light in reach and all needs met ? ? ?Therapy Documentation ?Precautions:  ?Precautions ?Precautions: Fall ?Precaution Comments: R charcot foot, R HP ?Restrictions ?Weight Bearing Restrictions: Yes ?General: ?  ? ?Therapy/Group: Individual Therapy ? ?Lowella Dell Colie Fugitt ?01/04/2022, 6:56 AM ?

## 2022-01-04 NOTE — Progress Notes (Signed)
?                                                       PROGRESS NOTE ? ? ?Subjective/Complaints: ?No issues overnite  ? ?ROS- neg CP, SOB, N/V/D ?Objective: ?  ?No results found. ?Recent Labs  ?  01/03/22 ?0602  ?WBC 9.0  ?HGB 9.6*  ?HCT 29.0*  ?PLT 202  ? ? ?Recent Labs  ?  01/03/22 ?0602  ?NA 141  ?K 4.0  ?CL 116*  ?CO2 20*  ?GLUCOSE 130*  ?BUN 17  ?CREATININE 1.62*  ?CALCIUM 7.8*  ? ? ? ?Intake/Output Summary (Last 24 hours) at 01/04/2022 0819 ?Last data filed at 01/04/2022 0734 ?Gross per 24 hour  ?Intake 355 ml  ?Output --  ?Net 355 ml  ? ?  ? ?  ? ?Physical Exam: ?Vital Signs ?Blood pressure (!) 148/80, pulse 76, temperature 98.7 ?F (37.1 ?C), resp. rate 20, weight 70.1 kg, SpO2 100 %. ? ? ?General: No acute distress ?Mood and affect are appropriate ?Heart: Regular rate and rhythm no rubs murmurs or extra sounds ?Lungs: Clear to auscultation, breathing unlabored, no rales or wheezes ?Abdomen: Positive bowel sounds, soft nontender to palpation, nondistended ?Extremities: No clubbing, cyanosis, or edema ?Skin: No evidence of breakdown, no evidence of rash ?Neurologic: Dysarthria Cranial nerves II through XII intact, motor strength is 3/5 in Right and 4- Left  deltoid, bicep, tricep, grip, hip flexor, knee extensors, ankle dorsiflexor and plantar flexor ?Sensory exam normal sensation to light touch and proprioception in bilateral upper and lower extremities ?Cerebellar exam normal finger to nose to finger as well as heel to shin in bilateral upper and lower extremities ?Musculoskeletal: Full range of motion in all 4 extremities. No joint swelling ? ? ?Assessment/Plan: ?1. Functional deficits which require 3+ hours per day of interdisciplinary therapy in a comprehensive inpatient rehab setting. ?Physiatrist is providing close team supervision and 24 hour management of active medical problems listed below. ?Physiatrist and rehab team continue to assess barriers to discharge/monitor patient progress toward  functional and medical goals ? ?Care Tool: ? ?Bathing ?   ?Body parts bathed by patient: Right arm, Left arm, Chest, Abdomen, Face  ?   ?  ?  ?Bathing assist Assist Level: Supervision/Verbal cueing ?  ?  ?Upper Body Dressing/Undressing ?Upper body dressing   ?What is the patient wearing?: Pull over shirt ?   ?Upper body assist Assist Level: Supervision/Verbal cueing ?   ?Lower Body Dressing/Undressing ?Lower body dressing ? ? ?   ?What is the patient wearing?: Underwear/pull up, Pants ? ?  ? ?Lower body assist Assist for lower body dressing: Moderate Assistance - Patient 50 - 74% ?   ? ?Toileting ?Toileting Toileting Activity did not occur (Acupuncturist and hygiene only): N/A (no void or bm)  ?Toileting assist Assist for toileting: Minimal Assistance - Patient > 75% ?  ?  ?Transfers ?Chair/bed transfer ? ?Transfers assist ?   ? ?Chair/bed transfer assist level: Moderate Assistance - Patient 50 - 74% ?  ?  ?Locomotion ?Ambulation ? ? ?Ambulation assist ? ?   ? ?Assist level: Moderate Assistance - Patient 50 - 74% ?Assistive device: Walker-rolling ?Max distance: 15  ? ?Walk 10 feet activity ? ? ?Assist ?   ? ?Assist level: Moderate Assistance - Patient - 50 - 74% ?Assistive  device: Walker-rolling  ? ?Walk 50 feet activity ? ? ?Assist Walk 50 feet with 2 turns activity did not occur: Safety/medical concerns ? ?  ?   ? ? ?Walk 150 feet activity ? ? ?Assist Walk 150 feet activity did not occur: Safety/medical concerns ? ?  ?  ?  ? ?Walk 10 feet on uneven surface  ?activity ? ? ?Assist Walk 10 feet on uneven surfaces activity did not occur: Safety/medical concerns ? ? ?  ?   ? ?Wheelchair ? ? ? ? ?Assist Is the patient using a wheelchair?: Yes ?Type of Wheelchair: Manual ?  ? ?Wheelchair assist level: Minimal Assistance - Patient > 75% ?Max wheelchair distance: 150  ? ? ?Wheelchair 50 feet with 2 turns activity ? ? ? ?Assist ? ?  ?  ? ? ?Assist Level: Minimal Assistance - Patient > 75%  ? ?Wheelchair 150 feet  activity  ? ? ? ?Assist ?   ? ? ?Assist Level: Minimal Assistance - Patient > 75%  ? ?Blood pressure (!) 148/80, pulse 76, temperature 98.7 ?F (37.1 ?C), resp. rate 20, weight 70.1 kg, SpO2 100 %. ? ?Medical Problem List and Plan: ?1. 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            -patient may shower ?            -ELOS/Goals: 14-20 days S/MinA ?            PT, OT ?2.  Antithrombotics: ?-DVT/anticoagulation:  Pharmaceutical: Lovenox ?            -antiplatelet therapy: DAPT X 3 weeks followed by ASA alone ?3. Pain Management: tylenol prn ?4. Mood: LCSW to follow for evaluation and support.  ?            -antipsychotic agents: N/a ?5. Neuropsych: This patient is capable of making decisions on her own behalf. ?6. Skin/Wound Care: routine pressure relief measures.  ?7. Fluids/Electrolytes/Nutrition: Monitor I/O. Creat stable  ? ?  Latest Ref Rng & Units 01/03/2022  ?  6:02 AM 01/01/2022  ?  4:13 AM 12/31/2021  ?  1:17 PM  ?BMP  ?Glucose 70 - 99 mg/dL 295   284   132    ?BUN 6 - 20 mg/dL 17   20   18     ?Creatinine 0.44 - 1.00 mg/dL   4.40   1.02    ?Sodium 135 - 145 mmol/L 141   140   142    ?Potassium 3.5 - 5.1 mmol/L 4.0   3.9   4.1    ?Chloride 98 - 111 mmol/L 116   117   114    ?CO2 22 - 32 mmol/L 20   20   20     ?Calcium 8.9 - 10.3 mg/dL 7.8   8.2   8.6    ?  ?8. HTN: Monitor BP TID--continue Avapro and metoprolol ?            ?Vitals:  ? 01/03/22 2123 01/04/22 0407  ?BP: (!) 163/72 (!) 148/80  ?Pulse:  76  ?Resp:  20  ?Temp:  98.7 ?F (37.1 ?C)  ?SpO2:  100%  ?Some lability , cont to monitor  ? ?9.T2DM: Hgb A1C- 7.1 trending down as compliant with diet but not meds.  ?            --monitor BS ac/hs and use SSI for elevated BS ?            --  BS variable from 74 to 235.  ?--Per [facility]--no meds picked up since Oct/Rx for basiglar and ozempic not picked up yet.  ?--Will start low dose Amaryl due to CKD.   ?CBG (last 3)  ?Recent Labs  ?  01/03/22 ?1645  01/03/22 ?2112 01/04/22 ?[id number]  ?GLUCAP 149* 215* 86  ? ? ?Good control 4/28, evening elevation last noc, monitor on glimipride started on 4/28 ?10. History of depression/anxiety: Continue Celexa.  ?11. Acute on chronic renal failure: SCr 1.33 at admission-->1.66 ?            --recheck labs in am. Encourage fluid intake.  ?12. Insomnia: Secondary to anxiety. Trazodone prn added. ?--monitor for now.  ?13. Overweight: BMI 25.72: provide dietary education ?  ?14.  Anemia no obvious blood loss, will check guaic  ? ?  Latest Ref Rng & Units 01/03/2022  ?  6:02 AM 12/31/2021  ?  1:17 PM 12/29/2021  ? 12:41 PM  ?CBC  ?WBC 4.0 - 10.5 K/uL 9.0   12.4     ?Hemoglobin 12.0 - 15.0 g/dL 9.6   62.111.5   9.5    ?Hematocrit 36.0 - 46.0 % 29.0   35.4   28.0    ?Platelets 150 - 400 K/uL 202   241     ?  ?LOS: ?2 days ?A FACE TO FACE EVALUATION WAS PERFORMED ? ?Stacey Lucas ?01/04/2022, 8:19 AM  ? ?  ?

## 2022-01-04 NOTE — Progress Notes (Signed)
Patient ID: Stacey Lucas, female   DOB: October 28, 1975, 46 y.o.   MRN: [id number] ?Met with the patient to review rehab process, team conference and the plan of care. Discussed current situation with secondary risk factors including DM (A1 C 7.1), HTN, HLD, hx of smoking, migraines and previous CVA. Discussed HH/CMM diet recommendations with food choices to increase protein intake. Also reviewed smoking cessation tips with DAPT x 3 wks  then ASA solo per neurology. Patient asked about disability application; referred to SW. Continue to follow along to discharge to address educational needs to facilitate preparation for discharge. Dorien Chihuahua B ? ?

## 2022-01-04 NOTE — Progress Notes (Signed)
Occupational Therapy Session Note ? ?Patient Details  ?Name: Stacey Lucas ?MRN: [id number] ?Date of Birth: Jan 16, 1976 ? ?Today's Date: 01/04/2022 ?OT Individual Time: 8295-6213 ?OT Individual Time Calculation (min): 56 min  ? ? ?Short Term Goals: ?Week 1:  OT Short Term Goal 1 (Week 1): Pt will complete standing grooming task with min A. ?OT Short Term Goal 2 (Week 1): Pt will don pants with min A. ?OT Short Term Goal 3 (Week 1): Pt will complete toilet transfer min A and with LRAD. ? ?Skilled Therapeutic Interventions/Progress Updates:  ?  Pt received semi-reclined in bed finished with breakfast, reports mild RLE but motivated and, agreeable to therapy. Session focus on self-care retraining, activity tolerance, RUE NMR in prep for improved ADL/IADL/func mobility performance + decreased caregiver burden. ? ?Came to sitting EOB with min A to progress RLE off bed. Stand-pivot to and from [facility] with overall min A to power up and CGA once in standing/assist to manage RW. Min A for balance during LB clothing management, continent void of bladder and close S for seated pericare.  ? ?Seated in w/c at sink, pt completed grooming tasks set-up A. Donned pants/underwear with mod A to pull up over B hips, able to thread BLE via figure 4 position and increased time. Total A w/c transport to and from gym for time management and energy conservation. ? ?Participated in the follow to target RUE NMR: ? -2x12 with 1 lb dowel rod: forward/backward circles, chest press, B shoulder flexion, B shoulder flexion and hold 2x30 seconds ? -massed practice of tossing bean bags into bucket, able to score all15 bags within 5 rounds ? ? ?Pt left semi-reclined in bed with bed alarm engaged, call bell in reach, and all immediate needs met.  ? ? ?Therapy Documentation ?Precautions:  ?Precautions ?Precautions: Fall ?Precaution Comments: R charcot foot, R HP ?Restrictions ?Weight Bearing Restrictions: Yes ? ?Pain: see session note ?  ?ADL: See Care Tool  for more details. ? ? ?Therapy/Group: Individual Therapy ? ?Volanda Napoleon MS, OTR/L ? ?01/04/2022, 6:50 AM ?

## 2022-01-04 NOTE — IPOC Note (Signed)
Overall Plan of Care (IPOC) ?Patient Details ?Name: Stacey Lucas ?MRN: [id number] ?DOB: [birth year] ? ?Admitting Diagnosis: CVA (cerebral vascular accident) (HCC) ? ?Hospital Problems: Principal Problem: ?  CVA (cerebral vascular accident) (HCC) ? ? ? ? Functional Problem List: ?Nursing Endurance, Medication Management, Safety  ?PT Balance, Behavior, Edema, Endurance, Motor, Sensory  ?OT Balance, Endurance, Perception, Motor  ?SLP Cognition  ?TR    ?    ? Basic ADL?s: ?OT Eating, Grooming, Bathing, Dressing, Toileting  ? ?  Advanced  ADL?s: ?OT Simple Meal Preparation  ?   ?Transfers: ?PT Bed Mobility, Bed to Chair, Car, Furniture, Floor  ?OT Tub/Shower, Toilet  ? ?  Locomotion: ?PT Ambulation, Wheelchair Mobility, Stairs  ? ?  Additional Impairments: ?OT Fuctional Use of Upper Extremity  ?SLP Communication, Social Cognition ?expression ?Problem Solving, Memory  ?TR    ? ? ?Anticipated Outcomes ?Item Anticipated Outcome  ?Self Feeding mod I  ?Swallowing ?   ?  ?Basic self-care ? mod I to S  ?Toileting ? mod I ?  ?Bathroom Transfers S  ?Bowel/Bladder ? n/a  ?Transfers ? superivsion assist with LRAD  ?Locomotion ? Ambulatory with RW supevision assist  ?Communication ? Supervision A  ?Cognition ? Supervision A  ?Pain ? n/a  ?Safety/Judgment ? maintain w cues  ? ?Therapy Plan: ?PT Intensity: Minimum of 1-2 x/day ,45 to 90 minutes ?PT Frequency: 5 out of 7 days ?PT Duration Estimated Length of Stay: 15-18 days ?OT Intensity: Minimum of 1-2 x/day, 45 to 90 minutes ?OT Frequency: 5 out of 7 days ?OT Duration/Estimated Length of Stay: 2-2.5 weeks ?SLP Intensity: Minumum of 1-2 x/day, 30 to 90 minutes ?SLP Frequency: 3 to 5 out of 7 days ?SLP Duration/Estimated Length of Stay: 2-2.5 weeks  ? ?Due to the current state of emergency, patients may not be receiving their 3-hours of Medicare-mandated therapy. ? ? Team Interventions: ?Nursing Interventions Disease Management/Prevention, Medication Management, Discharge Planning,  Patient/Family Education  ?PT interventions Ambulation/gait training, Discharge planning, Psychosocial support, Functional mobility training, Therapeutic Activities, Visual/perceptual remediation/compensation, Balance/vestibular training, Disease management/prevention, Skin care/wound management, Neuromuscular re-education, Therapeutic Exercise, Wheelchair propulsion/positioning, Cognitive remediation/compensation, Pain management, DME/adaptive equipment instruction, Splinting/orthotics, UE/LE Strength taining/ROM, Functional electrical stimulation, Community reintegration, Patient/family education, Stair training, UE/LE Coordination activities  ?OT Interventions Balance/vestibular training, Disease mangement/prevention, Neuromuscular re-education, Self Care/advanced ADL retraining, Therapeutic Exercise, Wheelchair propulsion/positioning, Pain management, DME/adaptive equipment instruction, Skin care/wound managment, UE/LE Strength taining/ROM, Community reintegration, Functional electrical stimulation, Patient/family education, Splinting/orthotics, UE/LE Coordination activities, Therapeutic Activities, Psychosocial support, Functional mobility training, Discharge planning  ?SLP Interventions Cognitive remediation/compensation, Cueing hierarchy, Functional tasks, Medication managment, Patient/family education, Speech/Language facilitation, Internal/external aids  ?TR Interventions    ?SW/CM Interventions Discharge Planning, Psychosocial Support, Patient/Family Education, Disease Management/Prevention  ? ?Barriers to Discharge ?MD  Medical stability  ?Nursing Decreased caregiver support ?1 level, level entry apt with significant other and daughter  ?PT Decreased caregiver support, Home environment access/layout, Insurance for SNF coverage ?   ?OT Decreased caregiver support, Inaccessible home environment, Home environment access/layout ?   ?SLP   ?   ?SW Insurance for SNF coverage ?Clifton Custard works Theme park manager, daughter  Dondra Prader able to assist. Patient boyfriend and his sisters will be primary caregivers. Youngest daughter can be unreliable.  ? ?Team Discharge Planning: ?Destination: PT-Home ,OT- Home , SLP-Home ?Projected Follow-up: PT-Outpatient PT, OT-  Outpatient OT, Home health OT, SLP-Home Health SLP, Other (comment) (intermittment supervision) ?Projected Equipment Needs: PT-To be determined, OT- To be determined, SLP-None recommended by SLP ?Equipment Details: PT- ,  OT-  ?Patient/family involved in discharge planning: PT- Patient,  OT-Patient, SLP-Patient ? ?MD ELOS: 14-20d ?Medical Rehab Prognosis:  Good ?Assessment: The patient has been admitted for CIR therapies with the diagnosis of Left subcortical infarct . The team will be addressing functional mobility, strength, stamina, balance, safety, adaptive techniques and equipment, self-care, bowel and bladder mgt, patient and caregiver education, substance abuse. Goals have been set at supervision . Anticipated discharge destination is Home . ? ? ? ?See Team Conference Notes for weekly updates to the plan of care  ?

## 2022-01-04 NOTE — Plan of Care (Signed)
?Problem: RH Balance ?Goal: LTG Patient will maintain dynamic standing with ADLs (OT) ?Description: LTG:  Patient will maintain dynamic standing balance with assist during activities of daily living (OT)  ?Flowsheets (Taken 01/04/2022 1005) ?LTG: Pt will maintain dynamic standing balance during ADLs with: Supervision/Verbal cueing ?  ?Problem: Sit to Stand ?Goal: LTG:  Patient will perform sit to stand in prep for activites of daily living with assistance level (OT) ?Description: LTG:  Patient will perform sit to stand in prep for activites of daily living with assistance level (OT) ?Flowsheets (Taken 01/04/2022 1005) ?LTG: PT will perform sit to stand in prep for activites of daily living with assistance level: Independent with assistive device ?  ?Problem: RH Eating ?Goal: LTG Patient will perform eating w/assist, cues/equip (OT) ?Description: LTG: Patient will perform eating with assist, with/without cues using equipment (OT) ?Flowsheets (Taken 01/04/2022 1005) ?LTG: Pt will perform eating with assistance level of: Independent with assistive device  ?  ?Problem: RH Grooming ?Goal: LTG Patient will perform grooming w/assist,cues/equip (OT) ?Description: LTG: Patient will perform grooming with assist, with/without cues using equipment (OT) ?Flowsheets (Taken 01/04/2022 1005) ?LTG: Pt will perform grooming with assistance level of: Independent with assistive device  ?  ?Problem: RH Bathing ?Goal: LTG Patient will bathe all body parts with assist levels (OT) ?Description: LTG: Patient will bathe all body parts with assist levels (OT) ?Flowsheets (Taken 01/04/2022 1005) ?LTG: Pt will perform bathing with assistance level/cueing: Supervision/Verbal cueing ?  ?Problem: RH Dressing ?Goal: LTG Patient will perform upper body dressing (OT) ?Description: LTG Patient will perform upper body dressing with assist, with/without cues (OT). ?Flowsheets (Taken 01/04/2022 1005) ?LTG: Pt will perform upper body dressing with assistance  level of: Independent with assistive device ?Goal: LTG Patient will perform lower body dressing w/assist (OT) ?Description: LTG: Patient will perform lower body dressing with assist, with/without cues in positioning using equipment (OT) ?Flowsheets (Taken 01/04/2022 1005) ?LTG: Pt will perform lower body dressing with assistance level of: Independent with assistive device ?  ?Problem: RH Toileting ?Goal: LTG Patient will perform toileting task (3/3 steps) with assistance level (OT) ?Description: LTG: Patient will perform toileting task (3/3 steps) with assistance level (OT)  ?Flowsheets (Taken 01/04/2022 1005) ?LTG: Pt will perform toileting task (3/3 steps) with assistance level: Independent with assistive device ?  ?Problem: RH Functional Use of Upper Extremity ?Goal: LTG Patient will use RT/LT upper extremity as a (OT) ?Description: LTG: Patient will use right/left upper extremity as a stabilizer/gross assist/diminished/nondominant/dominant level with assist, with/without cues during functional activity (OT) ?Flowsheets (Taken 01/04/2022 1005) ?LTG: Use of upper extremity in functional activities: RUE as dominant level ?LTG: Pt will use upper extremity in functional activity with assistance level of: Supervision/Verbal cueing ?  ?Problem: RH Simple Meal Prep ?Goal: LTG Patient will perform simple meal prep w/assist (OT) ?Description: LTG: Patient will perform simple meal prep with assistance, with/without cues (OT). ?Flowsheets (Taken 01/04/2022 1005) ?LTG: Pt will perform simple meal prep with assistance level of: Supervision/Verbal cueing ?  ?Problem: RH Toilet Transfers ?Goal: LTG Patient will perform toilet transfers w/assist (OT) ?Description: LTG: Patient will perform toilet transfers with assist, with/without cues using equipment (OT) ?Flowsheets (Taken 01/04/2022 1005) ?LTG: Pt will perform toilet transfers with assistance level of: Supervision/Verbal cueing ?  ?Problem: RH Tub/Shower Transfers ?Goal: LTG  Patient will perform tub/shower transfers w/assist (OT) ?Description: LTG: Patient will perform tub/shower transfers with assist, with/without cues using equipment (OT) ?Flowsheets (Taken 01/04/2022 1005) ?LTG: Pt will perform tub/shower stall transfers with  assistance level of: Supervision/Verbal cueing ?  ?

## 2022-01-04 NOTE — Progress Notes (Signed)
Physical Therapy Session Note ? ?Patient Details  ?Name: Stacey Lucas ?MRN: [id number] ?Date of Birth: 11-Jan-1976 ? ?Today's Date: 01/04/2022 ?PT Individual Time: 1105-1200 ?PT Individual Time Calculation (min): 55 min  ? ?Short Term Goals: ?Week 1:  PT Short Term Goal 1 (Week 1): Pt will transfer to and from [facility] with min assist consistently ?PT Short Term Goal 2 (Week 1): Pt will Ambulate 5f with min assist and LRAD ?PT Short Term Goal 3 (Week 1): Pt will propell WC with supervision assist ?PT Short Term Goal 4 (Week 1): Pt will improve Berg balance scale by >7 points to demonstrate improved functional movement ?Week 2:    ? ?Skilled Therapeutic Interventions/Progress Updates:  ? ?Pt received supine in bed and agreeable to PT. Supine>sit transfer with supervision  assist and cues for attention to the RLE. Pt able to don socks sitting in bed with supervision asist and increased time. [facility] and cues for posure and step height on the RLE. Pt transported to rehab gym. Seated NMR: LAQ, hip abduction, hip adduction, hip flexion, hip extension. Each completed with red tband on LLE and AAROM on the RLE. Noted to have decreased ROM on the R side compared to previous session. Gait training with RW 2 x 260fwith min assist on first bout and mod assist on second due to poor hip flexion/knee flexion with RLE step through. WC mobility with min assist and cues for symmetry on the RUE and LUE to prevnet  veer to the R. Pt returned to room and performed stand pivot transfer to bed with RW and min-mod assist to improve step length on the RLE. Sit>supine completed with supervision assist but UE required for RLE positioning in bed. Pt  left supine in bed with call bell in reach and all needs met.  ? ? ? ?Therapy Documentation ?Precautions:  ?Precautions ?Precautions: Fall ?Precaution Comments: R charcot foot, R HP ?Restrictions ?Weight Bearing Restrictions: Yes ? ?  ?Vital Signs: ?Therapy  Vitals ?Temp: 98.1 ?F (36.7 ?C) ?Temp Source: Oral ?Pulse Rate: 77 ?Resp: 16 ?BP: (!) 154/76 ?Patient Position (if appropriate): Lying ?Oxygen Therapy ?SpO2: 100 % ?O2 Device: Room Air ?Pain: ?6/10 RLE. Sore. Ambulation increased.  ? ? ? ?Therapy/Group: Individual Therapy ? ?AuLorie Phenix4/28/2023, 6:18 PM  ?

## 2022-01-04 NOTE — Progress Notes (Signed)
Speech Language Pathology Daily Session Note ? ?Patient Details  ?Name: Stacey Lucas ?MRN: [id number] ?Date of Birth: [birth year] ? ?Today's Date: 01/04/2022 ?SLP Individual Time: 1310-1400 ?SLP Individual Time Calculation (min): 50 min and Today's Date: 01/04/2022 ?SLP Missed Time: 10 Minutes ?Missed Time Reason: Nursing care ? ?Short Term Goals: ?Week 1: SLP Short Term Goal 1 (Week 1): Pt will demonstrate 90% speech intelligibility in conversation and during structured tasks with min A verbal cues. ?SLP Short Term Goal 2 (Week 1): Pt will demonstrate higher level word finding skills with min A semantic cues. ?SLP Short Term Goal 3 (Week 1): Pt will complete functional mildly complex problem solving tasks (medication/money/time management) with min A verbal cues. ?SLP Short Term Goal 4 (Week 1): Pt will demonstrate recall of novel and functional information with internal and external strategies given min A verbal cues. ? ?Skilled Therapeutic Interventions: Skilled ST treatment focused on cognitive and speech goals. Pt was receiving nursing care on arrival and missed initial 10 minutes of therapy. SLP facilitated session by providing verbal and written education on word finding strategies with emphasis on description strategy and using semantic features. Pt engaged in functional discussion regarding anticipatory awareness, PLOF, and her family. Pt was perceived as 90% intelligible at the conversation level with sup A verbal cues to occasionally reduce rate of speech and over-exaggerate. Pt stated her speech felt "good" today and clarity continues to fluctuate.There were minimal instances of word finding difficulty at the conversation level. Pt discussed consideration of discharging to her father's home vs. her home with her boyfriend and daughter. Pt states she needs to have further conversation with family and personal reflection. Patient was left in bed with alarm activated and immediate needs within reach at end of  session. Continue per current plan of care.   ?   ?Pain ?Pain Assessment ?Pain Scale: 0-10 ?Pain Score: 7  ?Pain Type: Acute pain ?Pain Location: Leg ?Pain Orientation: Right ?Pain Descriptors / Indicators: Aching;Discomfort ?Pain Onset: Gradual ?Patients Stated Pain Goal: 0 ?Pain Intervention(s): RN made aware ? ?Therapy/Group: Individual Therapy ? ?Stacey Lucas Stacey Lucas ?01/04/2022, 2:02 PM ?

## 2022-01-05 DIAGNOSIS — I6300 Cerebral infarction due to thrombosis of unspecified precerebral artery: Secondary | ICD-10-CM | POA: Diagnosis not present

## 2022-01-05 LAB — GLUCOSE, CAPILLARY
Glucose-Capillary: 77 mg/dL (ref 70–99)
Glucose-Capillary: 101 mg/dL — ABNORMAL HIGH (ref 70–99)
Glucose-Capillary: 138 mg/dL — ABNORMAL HIGH (ref 70–99)
Glucose-Capillary: 181 mg/dL — ABNORMAL HIGH (ref 70–99)
Glucose-Capillary: 349 mg/dL — ABNORMAL HIGH (ref 70–99)

## 2022-01-05 NOTE — Progress Notes (Signed)
Occupational Therapy Session Note ? ?Patient Details  ?Name: Stacey Lucas ?MRN: [id number] ?Date of Birth: 28-Apr-1976 ? ?Today's Date: 01/05/2022 ?OT Individual Time: 0702-0800 ?OT Individual Time Calculation (min): 58 min  ? ? ?Short Term Goals: ?Week 1:  OT Short Term Goal 1 (Week 1): Pt will complete standing grooming task with min A. ?OT Short Term Goal 2 (Week 1): Pt will don pants with min A. ?OT Short Term Goal 3 (Week 1): Pt will complete toilet transfer min A and with LRAD. ? ?Skilled Therapeutic Interventions/Progress Updates:  ?  Pt received semi-reclined in bed, reports R knee pain, agreeable to therapy. Session focus on self-care retraining, activity tolerance, RUE NMR, transfer retraining in prep for improved ADL/IADL/func mobility performance + decreased caregiver burden. ? ?Came to sitting EOB with close S. Stand-pivot x3 throughout with RW and min A to power up. Bathed full body seated on 3in1 in shower with close S, able to reach periarea through cut-out.  ? ?Completed UB dressing, grooming, and oral care while seated with set-up A of materials. LB dressing with min A to power up at sink and to pull clothing up over R hip due to RUE weakness. ? ?Completed the following therex with tan soft theraputty: pincer grasp and pull, pull apart, putty squeezes and rolling into log shape with RUE. ? ?Pt left supine in bed with bed alarm engaged, call bell in reach, and all immediate needs met.  ? ? ?Therapy Documentation ?Precautions:  ?Precautions ?Precautions: Fall ?Precaution Comments: R charcot foot, R HP ?Restrictions ?Weight Bearing Restrictions: Yes ? ?Pain: R knee pain, RN present to administer pain rx ?  ?ADL: See Care Tool for more details. ? ?Therapy/Group: Individual Therapy ? ?Volanda Napoleon MS, OTR/L ? ?01/05/2022, 6:51 AM ?

## 2022-01-05 NOTE — Progress Notes (Signed)
Physical Therapy Session Note ? ?Patient Details  ?Name: Stacey Lucas ?MRN: [id number] ?Date of Birth: 03-05-1976 ? ?Today's Date: 01/05/2022 ?PT Individual Time: 1403-1500 ?PT Individual Time Calculation (min): 57 min  ? ?Short Term Goals: ?Week 1:  PT Short Term Goal 1 (Week 1): Pt will transfer to and from [facility] with min assist consistently ?PT Short Term Goal 2 (Week 1): Pt will Ambulate 65f with min assist and LRAD ?PT Short Term Goal 3 (Week 1): Pt will propell WC with supervision assist ?PT Short Term Goal 4 (Week 1): Pt will improve Berg balance scale by >7 points to demonstrate improved functional movement ?Week 2:    ?Week 3:    ? ?Skilled Therapeutic Interventions/Progress Updates:  ? ?Pt received supine in bed and agreeable to PT. Supine>sit transfer with supervision assist and cues for decreased use of bed rial as able.  ? ?Stand pivot transfer with CGA and RW with cues for step height on the R.  ? ?Nustep reciprocal movement training to force use of RUE/RLE x 10 min with cues for full ROM and attention to the RUE/RLE ? ?Pt transported to entrance of [facility] Dynamic gait training over unlevel cement ground x 15102f forward/reverse gait 2 x 1235fach with RW side stepping R and L with UE supported on bench 2 x 8ft29fin assist from PT for all gait training with  cues for posture and step height on the RLE ? ?Pt returned to room and performed stand pivot transfer to bed with RW and CGA for safety. Sit>supine completed with supervision assist with noted use of LUE to pull RLE into bed, and left supine in bed with call bell in reach and all needs met.  ? ?   ? ?Therapy Documentation ?Precautions:  ?Precautions ?Precautions: Fall ?Precaution Comments: R charcot foot, R HP ?Restrictions ?Weight Bearing Restrictions: Yes ? ?  ?Vital Signs: ?Therapy Vitals ?Temp: 98.5 ?F (36.9 ?C) ?Temp Source: Oral ?Pulse Rate: 71 ?Resp: 17 ?BP: 137/78 ?Patient Position (if appropriate): Sitting ?Oxygen Therapy ?SpO2: 100 % ?O2  Device: Room Air ?Pain: ?Pain Assessment ?Pain Scale: 0-10 ?Pain Score: 5  ?Pain Location: Leg ?Pain Orientation: Right ?Pain Descriptors / Indicators: Aching;Discomfort ?Pain Onset: On-going ?Pain Intervention(s): Medication (See eMAR) ?Multiple Pain Sites: No ? ? ? ? ?Therapy/Group: Individual Therapy ? ?AustLorie Phenix29/2023, 4:35 PM  ?

## 2022-01-05 NOTE — Progress Notes (Signed)
Physical Therapy Session Note ? ?Patient Details  ?Name: Stacey Lucas ?MRN: [id number] ?Date of Birth: 1975-11-06 ? ?Today's Date: 01/05/2022 ?PT Individual Time: 0865-7846 ?PT Individual Time Calculation (min): 64 min  ? ?Short Term Goals: ?Week 1:  PT Short Term Goal 1 (Week 1): Pt will transfer to and from [facility] with min assist consistently ?PT Short Term Goal 2 (Week 1): Pt will Ambulate 41ft with min assist and LRAD ?PT Short Term Goal 3 (Week 1): Pt will propell WC with supervision assist ?PT Short Term Goal 4 (Week 1): Pt will improve Berg balance scale by >7 points to demonstrate improved functional movement ? ?Skilled Therapeutic Interventions/Progress Updates:  ?Patient supine in bed on entrance to room. Patient alert and agreeable to PT session.  ? ?Patient with no pain complaint throughout session. ? ?Therapeutic Activity: ?Bed Mobility: Patient performed supine <> sit with supervision. VC/ tc required for technique. ?Transfers: Patient performed sit<>stand and stand pivot transfers throughout session with MinA initially using heavy BUE push and support on RW and improving to hands on knees and CGA following NMR. Provided vc/ tc for technique throughout. Toilet transfer performed with CGA and use of RW/ safety rail. Requires light MinA for LB doffing donning of pants. And supervision for pericare.  ? ?Neuromuscular Re-ed: ?NMR facilitated during session with focus on muscle activation and sit<>stand training. Pt guided in sit<>stand from mat table to no AD using verbal instruction and visual demonstration followed by NDT cueing at trunk for weight shift and BLE muscle activation.  Pt is able to progress from need for MinA to CGA with hands on knees for balance and assist. Standing balance challenged with arm positioning for self perturbations and to build confidence in abilities and whole body activation. Then guided in bean bag toss to target using RUE with CGA/ int light MinA for balance. NMR performed for  improvements in motor control and coordination, balance, sequencing, judgement, and self confidence/ efficacy in performing all aspects of mobility at highest level of independence.  ? ?Patient supine  in bed at end of session with brakes locked, bed alarm set, and all needs within reach. ? ? ?Therapy Documentation ?Precautions:  ?Precautions ?Precautions: Fall ?Precaution Comments: R charcot foot, R HP ?Restrictions ?Weight Bearing Restrictions: Yes ?General: ?  ?Vital Signs: ?Therapy Vitals ?Temp: 98.5 ?F (36.9 ?C) ?Temp Source: Oral ?Pulse Rate: 71 ?Resp: 17 ?BP: 137/78 ?Patient Position (if appropriate): Sitting ?Oxygen Therapy ?SpO2: 100 % ?O2 Device: Room Air ?Pain: ?Pain Assessment ?Pain Scale: 0-10 ?Pain Score: 5  ?Pain Type: Acute pain ?Pain Location: Leg ?Pain Orientation: Right ?Pain Descriptors / Indicators: Aching;Discomfort ?Pain Onset: On-going ?Patients Stated Pain Goal: 0 ?Pain Intervention(s): Medication (See eMAR) ?Multiple Pain Sites: No ?Mobility: ?  ?Locomotion : ?   ?Trunk/Postural Assessment : ?   ?Balance: ?  ?Exercises: ?  ?Other Treatments:   ? ? ? ?Therapy/Group: Individual Therapy ? ?Loel Dubonnet PT, [name] ?01/05/2022, 2:35 PM  ?

## 2022-01-05 NOTE — Progress Notes (Signed)
?                                                       PROGRESS NOTE ? ? ?Subjective/Complaints: ? ?No issues overnite , some left knee pain during therapy  ? ?ROS- neg CP, SOB, N/V/D ?Objective: ?  ?No results found. ?Recent Labs  ?  01/03/22 ?0602  ?WBC 9.0  ?HGB 9.6*  ?HCT 29.0*  ?PLT 202  ? ? ?Recent Labs  ?  01/03/22 ?0602  ?NA 141  ?K 4.0  ?CL 116*  ?CO2 20*  ?GLUCOSE 130*  ?BUN 17  ?CREATININE 1.62*  ?CALCIUM 7.8*  ? ? ? ?Intake/Output Summary (Last 24 hours) at 01/05/2022 1040 ?Last data filed at 01/04/2022 1851 ?Gross per 24 hour  ?Intake 210 ml  ?Output --  ?Net 210 ml  ? ?  ? ?  ? ?Physical Exam: ?Vital Signs ?Blood pressure (!) 165/94, pulse 81, temperature 97.8 ?F (36.6 ?C), temperature source Oral, resp. rate 16, weight 70.1 kg, SpO2 100 %. ? ? ?General: No acute distress ?Mood and affect are appropriate ?Heart: Regular rate and rhythm no rubs murmurs or extra sounds ?Lungs: Clear to auscultation, breathing unlabored, no rales or wheezes ?Abdomen: Positive bowel sounds, soft nontender to palpation, nondistended ?Extremities: No clubbing, cyanosis, or edema ?Skin: No evidence of breakdown, no evidence of rash ?Neurologic: Dysarthria Cranial nerves II through XII intact, motor strength is 3/5 in Right and 4- Left  deltoid, bicep, tricep, grip, hip flexor, knee extensors, ankle dorsiflexor and plantar flexor ?Sensory exam normal sensation to light touch and proprioception in bilateral upper and lower extremities ?Cerebellar exam normal finger to nose to finger as well as heel to shin in bilateral upper and lower extremities ?Musculoskeletal: Full range of motion in all 4 extremities. No joint swelling, no pain with SLR Left knee  ? ? ?Assessment/Plan: ?1. Functional deficits which require 3+ hours per day of interdisciplinary therapy in a comprehensive inpatient rehab setting. ?Physiatrist is providing close team supervision and 24 hour management of active medical problems listed below. ?Physiatrist and  rehab team continue to assess barriers to discharge/monitor patient progress toward functional and medical goals ? ?Care Tool: ? ?Bathing ?   ?Body parts bathed by patient: Right arm, Left arm, Chest, Abdomen, Face  ?   ?  ?  ?Bathing assist Assist Level: Supervision/Verbal cueing ?  ?  ?Upper Body Dressing/Undressing ?Upper body dressing   ?What is the patient wearing?: Pull over shirt ?   ?Upper body assist Assist Level: Moderate Assistance - Patient 50 - 74% ?   ?Lower Body Dressing/Undressing ?Lower body dressing ? ? ?   ?What is the patient wearing?: Pants ? ?  ? ?Lower body assist Assist for lower body dressing: Moderate Assistance - Patient 50 - 74% ?   ? ?Toileting ?Toileting Toileting Activity did not occur (Acupuncturist and hygiene only): N/A (no void or bm)  ?Toileting assist Assist for toileting: Moderate Assistance - Patient 50 - 74% ?  ?  ?Transfers ?Chair/bed transfer ? ?Transfers assist ?   ? ?Chair/bed transfer assist level: Moderate Assistance - Patient 50 - 74% ?  ?  ?Locomotion ?Ambulation ? ? ?Ambulation assist ? ?   ? ?Assist level: Moderate Assistance - Patient 50 - 74% ?Assistive device: Walker-rolling ?Max distance: 15  ? ?  Walk 10 feet activity ? ? ?Assist ?   ? ?Assist level: Moderate Assistance - Patient - 50 - 74% ?Assistive device: Walker-rolling  ? ?Walk 50 feet activity ? ? ?Assist Walk 50 feet with 2 turns activity did not occur: Safety/medical concerns ? ?  ?   ? ? ?Walk 150 feet activity ? ? ?Assist Walk 150 feet activity did not occur: Safety/medical concerns ? ?  ?  ?  ? ?Walk 10 feet on uneven surface  ?activity ? ? ?Assist Walk 10 feet on uneven surfaces activity did not occur: Safety/medical concerns ? ? ?  ?   ? ?Wheelchair ? ? ? ? ?Assist Is the patient using a wheelchair?: Yes ?Type of Wheelchair: Manual ?  ? ?Wheelchair assist level: Minimal Assistance - Patient > 75% ?Max wheelchair distance: 150  ? ? ?Wheelchair 50 feet with 2 turns activity ? ? ? ?Assist ? ?  ?   ? ? ?Assist Level: Minimal Assistance - Patient > 75%  ? ?Wheelchair 150 feet activity  ? ? ? ?Assist ?   ? ? ?Assist Level: Minimal Assistance - Patient > 75%  ? ?Blood pressure (!) 165/94, pulse 81, temperature 97.8 ?F (36.6 ?C), temperature source Oral, resp. rate 16, weight 70.1 kg, SpO2 100 %. ? ?Medical Problem List and Plan: ?1. 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            -patient may shower ?            -ELOS/Goals: 14-20 days S/MinA ?            PT, OT ?2.  Antithrombotics: ?-DVT/anticoagulation:  Pharmaceutical: Lovenox ?            -antiplatelet therapy: DAPT X 3 weeks followed by ASA alone ?3. Pain Management: tylenol prn ?4. Mood: LCSW to follow for evaluation and support.  ?            -antipsychotic agents: N/a ?5. Neuropsych: This patient is capable of making decisions on her own behalf. ?6. Skin/Wound Care: routine pressure relief measures.  ?7. Fluids/Electrolytes/Nutrition: Monitor I/O. Creat stable  ? ?  Latest Ref Rng & Units 01/03/2022  ?  6:02 AM 01/01/2022  ?  4:13 AM 12/31/2021  ?  1:17 PM  ?BMP  ?Glucose 70 - 99 mg/dL 161130   096124   045106    ?BUN 6 - 20 mg/dL 17   20   18     ?Creatinine 0.44 - 1.00 mg/dL 4.091.62   8.111.66   9.141.61    ?Sodium 135 - 145 mmol/L 141   140   142    ?Potassium 3.5 - 5.1 mmol/L 4.0   3.9   4.1    ?Chloride 98 - 111 mmol/L 116   117   114    ?CO2 22 - 32 mmol/L 20   20   20     ?Calcium 8.9 - 10.3 mg/dL 7.8   8.2   8.6    ?  ?8. HTN: Monitor BP TID--continue Avapro and metoprolol ?            ?Vitals:  ? 01/04/22 1910 01/05/22 0433  ?BP: (!) 150/88 (!) 165/94  ?Pulse: 84 81  ?Resp: 16 16  ?Temp: 98.3 ?F (36.8 ?C) 97.8 ?F (36.6 ?C)  ?SpO2: 100% 100%  ?Some lability , cont to monitor  ? ?9.T2DM: Hgb A1C- 7.1 trending down as compliant with diet but not meds.  ?            --  monitor BS ac/hs and use SSI for elevated BS ?            --BS variable from 74 to 235.  ?--Per [facility]--no meds picked up since Oct/Rx for basiglar  and ozempic not picked up yet.  ?--Will start low dose Amaryl due to CKD.   ?CBG (last 3)  ?Recent Labs  ?  01/04/22 ?2120 01/05/22 ?0623 01/05/22 ?0936  ?GLUCAP 183* 77 101*  ? ? ?Good control 4/29, evening elevation last noc, monitor on glimipride started on 4/28 ?10. History of depression/anxiety: Continue Celexa.  ?11. Acute on chronic renal failure: SCr 1.33 at admission-->1.66 ?            --recheck labs in am. Encourage fluid intake.  ?12. Insomnia: Secondary to anxiety. Trazodone prn added. ?--monitor for now.  ?13. Overweight: BMI 25.72: provide dietary education ?  ?14.  Anemia no obvious blood loss, will check guaic  ? ?  Latest Ref Rng & Units 01/03/2022  ?  6:02 AM 12/31/2021  ?  1:17 PM 12/29/2021  ? 12:41 PM  ?CBC  ?WBC 4.0 - 10.5 K/uL 9.0   12.4     ?Hemoglobin 12.0 - 15.0 g/dL 9.6   16.1   9.5    ?Hematocrit 36.0 - 46.0 % 29.0   35.4   28.0    ?Platelets 150 - 400 K/uL 202   241     ?  ?LOS: ?3 days ?A FACE TO FACE EVALUATION WAS PERFORMED ? ?Victorino Sparrow Lular Letson ?01/05/2022, 10:40 AM  ? ?  ?

## 2022-01-05 NOTE — Progress Notes (Signed)
Speech Language Pathology Daily Session Note ? ?Patient Details  ?Name: Stacey Lucas ?MRN: [id number] ?Date of Birth: [birth year] ? ?Today's Date: 01/05/2022 ?SLP Individual Time: 0762-2633 ?SLP Individual Time Calculation (min): 42 min ? ?Short Term Goals: ?Week 1: SLP Short Term Goal 1 (Week 1): Pt will demonstrate 90% speech intelligibility in conversation and during structured tasks with min A verbal cues. ?SLP Short Term Goal 2 (Week 1): Pt will demonstrate higher level word finding skills with min A semantic cues. ?SLP Short Term Goal 3 (Week 1): Pt will complete functional mildly complex problem solving tasks (medication/money/time management) with min A verbal cues. ?SLP Short Term Goal 4 (Week 1): Pt will demonstrate recall of novel and functional information with internal and external strategies given min A verbal cues. ? ?Skilled Therapeutic Interventions: Skilled ST treatment focused on cognitive goals. SLP and pt completing medication management task to increase awareness of current medication regime. Pt was unable to recall names of meds taken at prior level, but reported they were for the purpose of high blood pressure. SLP wrote all current medications on paper chart including: name, mg, purpose, frequency. Pt ID pertinent medication information with mod I w/ use of visual. SLP facilitated session by providing overall sup A fading to mod I for identification of organization errors in daily BID pillbox, IND for generating appropriate solutions, and sup A verbal cues for problem solving hypothetical scenarios pertaining to medication management. Pt's speech intelligbilty was perceived as 100% intelligible at the conversation level with mod I for implementation of speech strategies. Suspect improved precision due to wearing dentures. SLP provided pt with denture adhesive strips due to loose upper fit.  ?Patient was left in bed with alarm activated and immediate needs within reach at end of session.  Continue per current plan of care.   ?   ? ?Pain ?Pain Assessment ?Pain Scale: 0-10 ?Pain Score: 5  ?Pain Type: Acute pain ?Pain Location: Leg ?Pain Orientation: Right ?Pain Descriptors / Indicators: Aching;Discomfort ?Pain Onset: On-going ?Patients Stated Pain Goal: 0 ?Pain Intervention(s): Medication (See eMAR) ?Multiple Pain Sites: No ? ?Therapy/Group: Individual Therapy ? ?Stacey Lucas Stacey Lucas ?01/05/2022, 1:11 PM ?

## 2022-01-06 LAB — GLUCOSE, CAPILLARY
Glucose-Capillary: 124 mg/dL — ABNORMAL HIGH (ref 70–99)
Glucose-Capillary: 148 mg/dL — ABNORMAL HIGH (ref 70–99)
Glucose-Capillary: 274 mg/dL — ABNORMAL HIGH (ref 70–99)
Glucose-Capillary: 263 mg/dL — ABNORMAL HIGH (ref 70–99)

## 2022-01-06 MED ORDER — AMLODIPINE BESYLATE 5 MG PO TABS
5.0000 mg | ORAL_TABLET | Freq: Every day | ORAL | Status: DC
  Administered 2022-01-06 – 2022-01-13 (×8): 5 mg via ORAL
  Filled 2022-01-06 (×9): qty 1

## 2022-01-07 DIAGNOSIS — N289 Disorder of kidney and ureter, unspecified: Secondary | ICD-10-CM | POA: Diagnosis not present

## 2022-01-07 DIAGNOSIS — N189 Chronic kidney disease, unspecified: Secondary | ICD-10-CM

## 2022-01-07 DIAGNOSIS — M1711 Unilateral primary osteoarthritis, right knee: Secondary | ICD-10-CM | POA: Diagnosis not present

## 2022-01-07 DIAGNOSIS — I639 Cerebral infarction, unspecified: Secondary | ICD-10-CM | POA: Diagnosis not present

## 2022-01-07 DIAGNOSIS — I10 Essential (primary) hypertension: Secondary | ICD-10-CM | POA: Diagnosis not present

## 2022-01-07 LAB — CBC
HCT: 28.7 % — ABNORMAL LOW (ref 36.0–46.0)
Hemoglobin: 9.6 g/dL — ABNORMAL LOW (ref 12.0–15.0)
MCH: 26.2 pg (ref 26.0–34.0)
MCHC: 33.4 g/dL (ref 30.0–36.0)
MCV: 78.2 fL — ABNORMAL LOW (ref 80.0–100.0)
Platelets: 256 10*3/uL (ref 150–400)
RBC: 3.67 MIL/uL — ABNORMAL LOW (ref 3.87–5.11)
RDW: 15.2 % (ref 11.5–15.5)
WBC: 9.9 10*3/uL (ref 4.0–10.5)
nRBC: 0 % (ref 0.0–0.2)

## 2022-01-07 LAB — BASIC METABOLIC PANEL
Anion gap: 4 — ABNORMAL LOW (ref 5–15)
BUN: 22 mg/dL — ABNORMAL HIGH (ref 6–20)
CO2: 24 mmol/L (ref 22–32)
Calcium: 8 mg/dL — ABNORMAL LOW (ref 8.9–10.3)
Chloride: 112 mmol/L — ABNORMAL HIGH (ref 98–111)
Creatinine, Ser: 1.42 mg/dL — ABNORMAL HIGH (ref 0.44–1.00)
GFR, Estimated: 46 mL/min — ABNORMAL LOW (ref >60)
Glucose, Bld: 122 mg/dL — ABNORMAL HIGH (ref 70–99)
Potassium: 3.9 mmol/L (ref 3.5–5.1)
Sodium: 140 mmol/L (ref 135–145)

## 2022-01-07 LAB — GLUCOSE, CAPILLARY
Glucose-Capillary: 118 mg/dL — ABNORMAL HIGH (ref 70–99)
Glucose-Capillary: 139 mg/dL — ABNORMAL HIGH (ref 70–99)
Glucose-Capillary: 170 mg/dL — ABNORMAL HIGH (ref 70–99)
Glucose-Capillary: 192 mg/dL — ABNORMAL HIGH (ref 70–99)

## 2022-01-07 LAB — OCCULT BLOOD X 1 CARD TO LAB, STOOL: Fecal Occult Bld: NEGATIVE

## 2022-01-07 MED ORDER — GLIMEPIRIDE 2 MG PO TABS
2.0000 mg | ORAL_TABLET | Freq: Every day | ORAL | Status: DC
  Administered 2022-01-08 – 2022-01-17 (×10): 2 mg via ORAL
  Filled 2022-01-07 (×10): qty 1

## 2022-01-07 MED ORDER — DICLOFENAC SODIUM 1 % EX GEL
4.0000 g | Freq: Three times a day (TID) | CUTANEOUS | Status: DC
  Administered 2022-01-07 – 2022-01-17 (×19): 4 g via TOPICAL
  Filled 2022-01-07: qty 100

## 2022-01-07 NOTE — Plan of Care (Signed)
?  Problem: Consults ?Goal: RH STROKE PATIENT EDUCATION ?Description: See Patient Education module for education specifics  ?Outcome: Progressing ?  ?Problem: RH SAFETY ?Goal: RH STG ADHERE TO SAFETY PRECAUTIONS W/ASSISTANCE/DEVICE ?Description: STG Adhere to Safety Precautions With cues Assistance/Device. ?Outcome: Progressing ?  ?Problem: RH KNOWLEDGE DEFICIT ?Goal: RH STG INCREASE KNOWLEDGE OF DIABETES ?Description: Patient and family will be able to manage DM with medications and dietary modifications using handouts and educational resources independently ?Outcome: Progressing ?Goal: RH STG INCREASE KNOWLEDGE OF HYPERTENSION ?Description: Patient and family will be able to manage HTN with medications and dietary modifications using handouts and educational resources independently ?Outcome: Progressing ?Goal: RH STG INCREASE KNOWLEGDE OF HYPERLIPIDEMIA ?Description: Patient and family will be able to manage HLD with medications and dietary modifications using handouts and educational resources independently ?Outcome: Progressing ?Goal: RH STG INCREASE KNOWLEDGE OF STROKE PROPHYLAXIS ?Description: Patient and family will be able to manage Secondary stroke with medications and dietary modifications using handouts and educational resources independently ?Outcome: Progressing ?  ?

## 2022-01-07 NOTE — Progress Notes (Signed)
Physical Therapy Session Note ? ?Patient Details  ?Name: Stacey Lucas ?MRN: [id number] ?Date of Birth: [birth year] ? ?Today's Date: 01/07/2022 ?PT Individual Time: [id number] ?PT Individual Time Calculation (min): 61 min  ? ?Short Term Goals: ?Week 1:  PT Short Term Goal 1 (Week 1): Pt will transfer to and from [facility] with min assist consistently ?PT Short Term Goal 2 (Week 1): Pt will Ambulate 30ft with min assist and LRAD ?PT Short Term Goal 3 (Week 1): Pt will propell WC with supervision assist ?PT Short Term Goal 4 (Week 1): Pt will improve Berg balance scale by >7 points to demonstrate improved functional movement ? ?Skilled Therapeutic Interventions/Progress Updates:  ?Patient supine in bed on entrance to room. Patient alert and agreeable to PT session.  ? ?Patient with no pain complaint throughout session. ? ?Therapeutic Activity: ?Bed Mobility: Patient performed supine <> sit with supervision using bed features. VC required for technique. ?Transfers: Patient performed stand pivot bed --> w/c with HHA  and CGA. Sit<>stand and stand pivot transfers throughout session with CGA to RW. Provided verbal cues for improved technique to engage BLE musculature. pt with increased hip flexion in stance causing increased lordosis. Improves with use of RW for UE support.  ? ?Gait Training:  ?Patient ambulated using RW with CGA. Cued to raise R toe during swing phase. In final 5', pt guided with amb using no AD and HHA/ CGA. Provided vc/ tc for increased step height and DF.  ? ?Neuromuscular Re-ed: ?NMR facilitated during session with focus on standing balance, muscle activation. Pt guided in blocked practice of sit<>stand transfers to improve confidence in stance over RLE. High knee marches with pt demonstrating increased lateral shift of head and shoulders over RLE when lifting LLE. Mirror used for improved proprioception of movement. Attempt to laterally weight shift hips to RLE with pt continuing to shift head/ shoulders.   ? ?Next guided in standing balance training. Pt performed pot stirrers with 2# weighted ball. 2x10 in each direction, self perturbations with changes in arm movements and maintaining upright posture with no UE support. ? ?NMR performed for improvements in motor control and coordination, balance, sequencing, judgement, and self confidence/ efficacy in performing all aspects of mobility at highest level of independence.  ? ?Patient supine  in bed at end of session with brakes locked, bed alarm set, and all needs within reach. ? ?Therapy Documentation ?Precautions:  ?Precautions ?Precautions: Fall ?Precaution Comments: R charcot foot, R HP ?Restrictions ?Weight Bearing Restrictions: No ?General: ?  ?Vital Signs: ?  ?Pain: ? No pain complaint this session.  ? ?Therapy/Group: Individual Therapy ? ?Alger Simons PT, [name] ?01/07/2022, 6:11 PM  ?

## 2022-01-07 NOTE — Progress Notes (Signed)
?                                                       PROGRESS NOTE ? ? ?Subjective/Complaints: ? ?C/o Right knee pain, particularly with wb.  ? ?ROS: Patient denies fever, rash, sore throat, blurred vision, dizziness, nausea, vomiting, diarrhea, cough, shortness of breath or chest pain,  back/neck pain, headache, or mood change.  ? ? ?Objective: ?  ?No results found. ? ?Recent Labs  ?  01/07/22 ?2694  ?WBC 9.9  ?HGB 9.6*  ?HCT 28.7*  ?PLT 256  ? ?Recent Labs  ?  01/07/22 ?8546  ?NA 140  ?K 3.9  ?CL 112*  ?CO2 24  ?GLUCOSE 122*  ?BUN 22*  ?CREATININE 1.42*  ?CALCIUM 8.0*  ? ? ?Intake/Output Summary (Last 24 hours) at 01/07/2022 0914 ?Last data filed at 01/07/2022 0720 ?Gross per 24 hour  ?Intake 554 ml  ?Output 750 ml  ?Net -196 ml  ?  ? ?  ? ?Physical Exam: ?Vital Signs ?Blood pressure 120/70, pulse 77, temperature 98.2 ?F (36.8 ?C), temperature source Oral, resp. rate 15, weight 70.1 kg, SpO2 99 %. ? ? ?Constitutional: No distress . Vital signs reviewed. ?HEENT: NCAT, EOMI, oral membranes moist ?Neck: supple ?Cardiovascular: RRR without murmur. No JVD    ?Respiratory/Chest: CTA Bilaterally without wheezes or rales. Normal effort    ?GI/Abdomen: BS +, non-tender, non-distended ?Ext: no clubbing, cyanosis, or edema ?Psych: pleasant and cooperative  ?Skin: No evidence of breakdown, no evidence of rash ?Neurologic: Dysarthria Cranial nerves II through XII intact, motor strength is 3/5 in Right and 4- Left  deltoid, bicep, tricep, grip, hip flexor, knee extensors, ankle dorsiflexor and plantar flexor ?Sensory exam normal for light touch and pain in all 4 limbs. No limb ataxia or cerebellar signs. No abnormal tone appreciated.   ?Musculoskeletal: Full range of motion in all 4 extremities. Minimal pain with right knee ROM, minimal effusion.  ? ? ?Assessment/Plan: ?1. Functional deficits which require 3+ hours per day of interdisciplinary therapy in a comprehensive inpatient rehab setting. ?Physiatrist is providing close  team supervision and 24 hour management of active medical problems listed below. ?Physiatrist and rehab team continue to assess barriers to discharge/monitor patient progress toward functional and medical goals ? ?Care Tool: ? ?Bathing ?   ?Body parts bathed by patient: Right arm, Left arm, Chest, Abdomen, Face  ?   ?  ?  ?Bathing assist Assist Level: Supervision/Verbal cueing ?  ?  ?Upper Body Dressing/Undressing ?Upper body dressing   ?What is the patient wearing?: Pull over shirt ?   ?Upper body assist Assist Level: Moderate Assistance - Patient 50 - 74% ?   ?Lower Body Dressing/Undressing ?Lower body dressing ? ? ?   ?What is the patient wearing?: Pants ? ?  ? ?Lower body assist Assist for lower body dressing: Moderate Assistance - Patient 50 - 74% ?   ? ?Toileting ?Toileting Toileting Activity did not occur (Acupuncturist and hygiene only): N/A (no void or bm)  ?Toileting assist Assist for toileting: Moderate Assistance - Patient 50 - 74% ?  ?  ?Transfers ?Chair/bed transfer ? ?Transfers assist ?   ? ?Chair/bed transfer assist level: Moderate Assistance - Patient 50 - 74% ?  ?  ?Locomotion ?Ambulation ? ? ?Ambulation assist ? ?   ? ?Assist level:  Moderate Assistance - Patient 50 - 74% ?Assistive device: Walker-rolling ?Max distance: 15  ? ?Walk 10 feet activity ? ? ?Assist ?   ? ?Assist level: Moderate Assistance - Patient - 50 - 74% ?Assistive device: Walker-rolling  ? ?Walk 50 feet activity ? ? ?Assist Walk 50 feet with 2 turns activity did not occur: Safety/medical concerns ? ?  ?   ? ? ?Walk 150 feet activity ? ? ?Assist Walk 150 feet activity did not occur: Safety/medical concerns ? ?  ?  ?  ? ?Walk 10 feet on uneven surface  ?activity ? ? ?Assist Walk 10 feet on uneven surfaces activity did not occur: Safety/medical concerns ? ? ?  ?   ? ?Wheelchair ? ? ? ? ?Assist Is the patient using a wheelchair?: Yes ?Type of Wheelchair: Manual ?  ? ?Wheelchair assist level: Minimal Assistance - Patient >  75% ?Max wheelchair distance: 150  ? ? ?Wheelchair 50 feet with 2 turns activity ? ? ? ?Assist ? ?  ?  ? ? ?Assist Level: Minimal Assistance - Patient > 75%  ? ?Wheelchair 150 feet activity  ? ? ? ?Assist ?   ? ? ?Assist Level: Minimal Assistance - Patient > 75%  ? ?Blood pressure 120/70, pulse 77, temperature 98.2 ?F (36.8 ?C), temperature source Oral, resp. rate 15, weight 70.1 kg, SpO2 99 %. ? ?Medical Problem List and Plan: ?1. 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            -patient may shower ?            -ELOS/Goals: 14-20 days S/MinA ?            -Continue CIR therapies including PT, OT  ?2.  Antithrombotics: ?-DVT/anticoagulation:  Pharmaceutical: Lovenox ?            -antiplatelet therapy: DAPT X 3 weeks followed by ASA alone ?3. Pain Management: tylenol prn ? -add voltaren gel to RIGHT knee. ?4. Mood: LCSW to follow for evaluation and support.  ?            -antipsychotic agents: N/a ?5. Neuropsych: This patient is capable of making decisions on her own behalf. ?6. Skin/Wound Care: routine pressure relief measures.  ?7. Fluids/Electrolytes/Nutrition: Monitor I/O.   ? ?  Latest Ref Rng & Units 01/07/2022  ?  5:51 AM 01/03/2022  ?  6:02 AM 01/01/2022  ?  4:13 AM  ?BMP  ?Glucose 70 - 99 mg/dL 401122   027130   253124    ?BUN 6 - 20 mg/dL 22   17   20     ?Creatinine 0.44 - 1.00 mg/dL 6.641.42   4.031.62   4.741.66    ?Sodium 135 - 145 mmol/L 140   141   140    ?Potassium 3.5 - 5.1 mmol/L 3.9   4.0   3.9    ?Chloride 98 - 111 mmol/L 112   116   117    ?CO2 22 - 32 mmol/L 24   20   20     ?Calcium 8.9 - 10.3 mg/dL 8.0   7.8   8.2    ?  5/1 BUN sl increased. Encourage po ?8. HTN: Monitor BP TID--continue Avapro and metoprolol ?            ?Vitals:  ? 01/07/22 0535 01/07/22 0900  ?BP: 132/70 120/70  ?Pulse: 74 77  ?Resp: 16 15  ?Temp: 98.2 ?F (36.8 ?C)   ?  SpO2: 100% 99%  ?5/1 bp controlled ?9.T2DM: Hgb A1C- 7.1 trending down as compliant with diet but not meds.  ?             --monitor BS ac/hs and use SSI for elevated BS ?            --BS variable from 74 to 235.  ?--Per [facility]--no meds picked up since Oct/Rx for basiglar and ozempic not picked up yet.  ?--Will start low dose Amaryl due to CKD.   ?CBG (last 3)  ?Recent Labs  ?  01/06/22 ?1617 01/06/22 ?2150 01/07/22 ?0633  ?GLUCAP 274* 263* 118*  ? ?5/1 increase amaryl to 2mg  daily ?10. History of depression/anxiety: Continue Celexa.  ?11. Acute on chronic renal failure: SCr 1.33 at admission-->1.66 ?            -5/1 Cr 1.42 ?12. Insomnia: Secondary to anxiety. Trazodone prn added. ?--monitor for now.  ?13. Overweight: BMI 25.72: provide dietary education ?  ?14.  Anemia no obvious blood loss, will check guaic  ? ?  Latest Ref Rng & Units 01/07/2022  ?  5:51 AM 01/03/2022  ?  6:02 AM 12/31/2021  ?  1:17 PM  ?CBC  ?WBC 4.0 - 10.5 K/uL 9.9   9.0   12.4    ?Hemoglobin 12.0 - 15.0 g/dL 9.6   9.6   01/02/2022    ?Hematocrit 36.0 - 46.0 % 28.7   29.0   35.4    ?Platelets 150 - 400 K/uL 256   202   241    ?  5/1 hgb stable ?LOS: ?5 days ?A FACE TO FACE EVALUATION WAS PERFORMED ? ?97.4 ?01/07/2022, 9:14 AM  ? ?  ?

## 2022-01-07 NOTE — Progress Notes (Signed)
Physical Therapy Session Note ? ?Patient Details  ?Name: Stacey Lucas ?MRN: [id number] ?Date of Birth: [birth year] ? ?Today's Date: 01/07/2022 ?PT Individual Time: 1030-1055 ?PT Individual Time Calculation (min): 25 min  ? ?Short Term Goals: ?Week 1:  PT Short Term Goal 1 (Week 1): Pt will transfer to and from [facility] with min assist consistently ?PT Short Term Goal 2 (Week 1): Pt will Ambulate 69ft with min assist and LRAD ?PT Short Term Goal 3 (Week 1): Pt will propell WC with supervision assist ?PT Short Term Goal 4 (Week 1): Pt will improve Berg balance scale by >7 points to demonstrate improved functional movement ? ?Skilled Therapeutic Interventions/Progress Updates:  ?  Patient received supine in bed, agreeable to PT. She denies pain. Able to come sit EOB with supervision. Donning pants with MinA. Patient with short distance ambulatory transfer to wc with RW and CGA. Verbal cues for increased R foot clearance and hand positioning for safety when standing/sitting. PT transporting patient in wc to therapy gym for time management and energy conservation. She completed 10 mins on NuStep level 4 with B UE and LE for improved reciprocal coordination and emphasis on proper R knee alignment. Patient returning to room in wc, chair alarm on, call light within reach.  ? ?Therapy Documentation ?Precautions:  ?Precautions ?Precautions: Fall ?Precaution Comments: R charcot foot, R HP ?Restrictions ?Weight Bearing Restrictions: No ? ? ?Therapy/Group: Individual Therapy ? ?Westley Foots ?Westley Foots, PT, [name], CBIS ? ?01/07/2022, 7:46 AM  ?

## 2022-01-07 NOTE — Progress Notes (Signed)
Speech Language Pathology Daily Session Note ? ?Patient Details  ?Name: Stacey Lucas ?MRN: [id number] ?Date of Birth: December 14, 1975 ? ?Today's Date: 01/07/2022 ?SLP Individual Time: 5027-7412 ?SLP Individual Time Calculation (min): 45 min ? ?Short Term Goals: ?Week 1: SLP Short Term Goal 1 (Week 1): Pt will demonstrate 90% speech intelligibility in conversation and during structured tasks with min A verbal cues. ?SLP Short Term Goal 2 (Week 1): Pt will demonstrate higher level word finding skills with min A semantic cues. ?SLP Short Term Goal 3 (Week 1): Pt will complete functional mildly complex problem solving tasks (medication/money/time management) with min A verbal cues. ?SLP Short Term Goal 4 (Week 1): Pt will demonstrate recall of novel and functional information with internal and external strategies given min A verbal cues. ? ?Skilled Therapeutic Interventions: ?Pt seen this date for skilled ST intervention targeting cognitive-linguistic goals outlined above. Pt encountered awake/alert any lying semi-reclined in bed. Receiving medication from LPN; MD present at the beginning of today's session. No difficulty with medication administered whole with thin liquids. Offered to move pt OOB to recliner chair; however, she politely declined despite encouragement, stating that she would like to shower with therapy first. Agreeable to ST intervention at bedside.  ?  ?SLP facilitated today's session by providing the following skilled interventions during therapeutic and functional tx tasks: skilled compensatory memory strategy training, word outlines + tracing for improved legibility when writing with her R hand, errorless learning principles, and positive reinforcement for functional error awareness during functional, semi-complex problem-solving and recall task.  ? ?Pt actively participated in compensatory memory strategy training by writing the designated a strategy for each letter in the acronym WARM (write, associate,  repetition, and mental imagery). Pt utilized 3 out of 4 aforementioned strategies during functional organization task of grouping mediations with the same purpose together to aid in recall. Prior to engaging in organization task, pt recalled 100% of medications by utilizing external aid/list, created during last ST session, with Mod I. Following practice with repetition, association, and mental imagery strategies, pt recalled ~80% of her medications with overall Mod I to Min verbal A without referencing list. Pt independently endorsed and corrected errors when writing names of medications on ~90% of opportunities. Pt demonstrates functional awareness of poor legibility and benefited from lines and tracing to improve legibility when utilizing R hand for writing. Pt attempted to repair writing errors by re-writing target word with ~80% accuracy. Subjectively, pt's intelligibility was noted to be ~95 to 100% at conversational level to a novel communication partner. ? ?Session concluded with pt in bed, bed alarm set, call bell reviewed and within reach, and all immediate needs met. Continue per current POC. ? ?Pain ?No pain reported; NAD ? ?Therapy/Group: Individual Therapy ? ?Nieve Rojero A Lydian Chavous ?01/07/2022, 12:32 PM ?

## 2022-01-07 NOTE — Progress Notes (Signed)
Occupational Therapy Session Note ? ?Patient Details  ?Name: Stacey Lucas ?MRN: [id number] ?Date of Birth: 13-Mar-1976 ? ?Today's Date: 01/07/2022 ?OT Individual Time: 1100-1155 ?OT Individual Time Calculation (min): 55 min  ? ? ?Short Term Goals: ?Week 1:  OT Short Term Goal 1 (Week 1): Pt will complete standing grooming task with min A. ?OT Short Term Goal 2 (Week 1): Pt will don pants with min A. ?OT Short Term Goal 3 (Week 1): Pt will complete toilet transfer min A and with LRAD. ? ?Skilled Therapeutic Interventions/Progress Updates:  ?Skilled OT intervention completed with focus on self-care, functional transfers, dynamic balance. Pt received seated in w/c, no c/o pain. Requested to start session with ADLs, declining shower at this time. Seated at sink pt doffed shirt with supervision, bathed UB with set up A, and grooming tasks with set up A. Retrieved adhesive removers for EKG lead marks on pt's chest with pt able to remove with min A for accuracy. Supervision for donning UB. Transported pt in w/c with total A for energy conservation <> gym. Sit > stand with CGA using RW, then participated in dynamic reaching balance task towards R side, with CGA needed for balance during retrieving/removing of horse shoes to/from basketball goal. Task upgraded with lower placement for retrieval of horse shoes, with pt able to retrieve however 2 minor episodes of R knee buckling noted, with therapist providing light blocking with hand. Educated pt on how strengthening the RLE and practicing balance strategies with reaching lower and shifting to the R is important with ADL task completion. Pt able to demonstrate weight shifting of BLEs with use of RW, with CGA, via tapping different numbers based on R/L and number called out by therapist. Education provided about BOS needed for shifting weight vs narrow support that can cause LOB. Pt preferring to stay up in w/c for lunch, with pt left seated, with chair alarm on and all needs in  reach at end of session. ? ? ?Therapy Documentation ?Precautions:  ?Precautions ?Precautions: Fall ?Precaution Comments: R charcot foot, R HP ?Restrictions ?Weight Bearing Restrictions: No ? ? ? ? ?Therapy/Group: Individual Therapy ? ?Auna Mikkelsen E Genieve Ramaswamy ?01/07/2022, 7:46 AM ?

## 2022-01-08 DIAGNOSIS — I6300 Cerebral infarction due to thrombosis of unspecified precerebral artery: Secondary | ICD-10-CM | POA: Diagnosis not present

## 2022-01-08 LAB — GLUCOSE, CAPILLARY
Glucose-Capillary: 165 mg/dL — ABNORMAL HIGH (ref 70–99)
Glucose-Capillary: 62 mg/dL — ABNORMAL LOW (ref 70–99)
Glucose-Capillary: 93 mg/dL (ref 70–99)
Glucose-Capillary: 271 mg/dL — ABNORMAL HIGH (ref 70–99)
Glucose-Capillary: 187 mg/dL — ABNORMAL HIGH (ref 70–99)

## 2022-01-08 NOTE — Progress Notes (Signed)
Occupational Therapy Session Note ? ?Patient Details  ?Name: Stacey Lucas ?MRN: [id number] ?Date of Birth: [birth year] ? ?Today's Date: 01/08/2022 ?OT Individual Time: 0702-0807 ?OT Individual Time Calculation (min): 65 min  ? ? ?Short Term Goals: ?Week 1:  OT Short Term Goal 1 (Week 1): Pt will complete standing grooming task with min A. ?OT Short Term Goal 2 (Week 1): Pt will don pants with min A. ?OT Short Term Goal 3 (Week 1): Pt will complete toilet transfer min A and with LRAD. ? ?Skilled Therapeutic Interventions/Progress Updates:  ?  Pt received asleep in bed, but easily awakened to voice and agreeable to therapy. Session focus on self-care retraining, activity tolerance, transfer retraining in prep for improved ADL/IADL/func mobility performance + decreased caregiver burden. Reports L knee pain due to hitting it against hospital table, LPN later present and made aware of pt request for pain rx. ? ?Came to sitting EOB mod I. Ambulatory walk-in shower transfer with overall CGA to light min A due to flexed posture and heavy reliance on BUE support on RW. Bathed full-body at sit to stand level with use of shower chair/grab bars with close S and cues for safety.  ? ?Seated in w/c at sink ,completed grooming and UBD with set-up A. Completed LBD with close S and increased time to thread BLE via figure 4 position. Sit to stand at sink with close S. Pt reports feeling close to baseline post prior stroke, but very motivated to regain as much function as possible. ? ?Pt left seated in recliner with chair pad alarm engaged, call bell in reach, and all immediate needs met.  ? ? ?Therapy Documentation ?Precautions:  ?Precautions ?Precautions: Fall ?Precaution Comments: R charcot foot, R HP ?Restrictions ?Weight Bearing Restrictions: No ? ?Pain: ?  See session note ?ADL: See Care Tool for more details. ? ?Therapy/Group: Individual Therapy ? ?Volanda Napoleon MS, OTR/L ? ?01/08/2022, 6:53 AM ?

## 2022-01-08 NOTE — Progress Notes (Signed)
Physical Therapy Session Note ? ?Patient Details  ?Name: Stacey Lucas ?MRN: [id number] ?Date of Birth: [birth year] ? ?Today's Date: 01/08/2022 ?PT Individual Time: [id number] and [phone] ?PT Individual Time Calculation (min): 59 min and 64 min ? ?Short Term Goals: ?Week 1:  PT Short Term Goal 1 (Week 1): Pt will transfer to and from [facility] with min assist consistently ?PT Short Term Goal 2 (Week 1): Pt will Ambulate 54ft with min assist and LRAD ?PT Short Term Goal 3 (Week 1): Pt will propell WC with supervision assist ?PT Short Term Goal 4 (Week 1): Pt will improve Berg balance scale by >7 points to demonstrate improved functional movement ? ?Skilled Therapeutic Interventions/Progress Updates:  ?Session 1: ?Patient supine in bed on entrance to room. Patient alert and agreeable to PT session.  ? ?Patient with no pain complaint throughout session. ? ?Therapeutic Activity: ?Bed Mobility: Patient performed supine <> sit with supervision. No cueing required for technique. Reminded not to impulsively stand.  ?Transfers: Patient performed sit<>stand and stand pivot transfers throughout session with CGA. Provided verbal cues for equal WB. ? ?Gait Training:  ?Patient ambulated 8' x1/ 45' x1/ 28' x1using RW with CGA. Cued to raise R toe during swing phase. Demos very slow pace.  ? ? ?Pt guided in stair training with physical demonstration and verbal instructions provided prior to performance. Pt is able to complete two steps forward then descending backward  with CGA/ MinA. Then four 6" steps using BHR with CGA to ascend leading with LLE and then MinA to descend leading with RLE and heavy BUE use. VC provided at initiation of each direction for leading LE.  ? ? ?Neuromuscular Re-ed: ?NMR facilitated during session with focus on dynamic standing. Pt guided in stair training, and sit<>stand training with fwd/ bkwd step. NMR performed for improvements in motor control and coordination, balance, sequencing, judgement, and self  confidence/ efficacy in performing all aspects of mobility at highest level of independence.  ? ?Patient supine  in bed at end of session with brakes locked, bed alarm set, and all needs within reach. Provided with ice water on pt request. ? ?Session 2: ?Patient supine in bed on entrance to room. Patient alert and agreeable to PT session. Taken outside in w/c for time. Discussion re: pt's home setup and daily routines.  ? ?Patient with no pain complaint throughout session. ? ?Therapeutic Activity: ?Bed Mobility: Patient performed supine <> sit with supervision/ Mod I. No cueing required. ?Transfers: Patient performed sit<>stand and stand pivot transfers throughout session with CGA/ close supervision. Provided verbal cues for technique with decreased use of UE. ? ?Gait Training:  ?Patient ambulated 180 ft over uneven outdoor surfaces over thresholds and across level indoor flooring using RW with CGA for balance/ strength. Demonstrated need for cues to control walker on downslopes and increased step height on upslopes. Back in room, pt ambulates 8 feet with no AD and no UE support with CGA.  ? ?Neuromuscular Re-ed: ?NMR facilitated during session with focus on standing balance. Pt guided in sit<>stand performance with no UE push assist. NMR performed for improvements in motor control and coordination, balance, sequencing, judgement, and self confidence/ efficacy in performing all aspects of mobility at highest level of independence.  ? ?Patient seated on EOB at end of session with brakes locked, bed alarm set, and all needs within reach. ? ? ?Therapy Documentation ?Precautions:  ?Precautions ?Precautions: Fall ?Precaution Comments: R charcot foot, R HP ?Restrictions ?Weight Bearing Restrictions: No ?General: ?  ?Vital Signs: ? ?  Pain: ? No pain complaint this day.  ? ?Therapy/Group: Individual Therapy ? ?Alger Simons PT, [name] ?01/08/2022, 12:55 PM  ?

## 2022-01-08 NOTE — Progress Notes (Signed)
?                                                       PROGRESS NOTE ? ? ?Subjective/Complaints: ? ?No issues overnite ,hit knee on rail this am a little sore ? ?ROS: Patient denies CP, SOB , N/V/D ? ? ?Objective: ?  ?No results found. ? ?Recent Labs  ?  01/07/22 ?[id number]  ?WBC 9.9  ?HGB 9.6*  ?HCT 28.7*  ?PLT 256  ? ? ?Recent Labs  ?  01/07/22 ?[id number]  ?NA 140  ?K 3.9  ?CL 112*  ?CO2 24  ?GLUCOSE 122*  ?BUN 22*  ?CREATININE 1.42*  ?CALCIUM 8.0*  ? ? ? ?Intake/Output Summary (Last 24 hours) at 01/08/2022 0851 ?Last data filed at 01/08/2022 0801 ?Gross per 24 hour  ?Intake 798 ml  ?Output --  ?Net 798 ml  ? ?  ? ?  ? ?Physical Exam: ?Vital Signs ?Blood pressure (!) 142/73, pulse 80, temperature 98.7 ?F (37.1 ?C), temperature source Oral, resp. rate 18, weight 70.1 kg, SpO2 97 %. ? ? ? ?General: No acute distress ?Mood and affect are appropriate ?Heart: Regular rate and rhythm no rubs murmurs or extra sounds ?Lungs: Clear to auscultation, breathing unlabored, no rales or wheezes ?Abdomen: Positive bowel sounds, soft nontender to palpation, nondistended ?Extremities: No clubbing, cyanosis, or edema ?Skin: No evidence of breakdown, no evidence of rash ? ? ?Skin: No evidence of breakdown, no evidence of rash ?Neurologic: Dysarthria Cranial nerves II through XII intact, motor strength is 3/5 in Right and 4- Left  deltoid, bicep, tricep, grip, hip flexor, knee extensors, ankle dorsiflexor and plantar flexor ?Sensory exam normal for light touch and pain in all 4 limbs. No limb ataxia or cerebellar signs. No abnormal tone appreciated.   ?Musculoskeletal: Full range of motion in all 4 extremities. L knee mild medial tenderness no swelling or effusion ? ? ?Assessment/Plan: ?1. Functional deficits which require 3+ hours per day of interdisciplinary therapy in a comprehensive inpatient rehab setting. ?Physiatrist is providing close team supervision and 24 hour management of active medical problems listed below. ?Physiatrist and rehab  team continue to assess barriers to discharge/monitor patient progress toward functional and medical goals ? ?Care Tool: ? ?Bathing ?   ?Body parts bathed by patient: Right arm, Left arm, Chest, Abdomen  ?   ?  ?  ?Bathing assist Assist Level: Set up assist ?  ?  ?Upper Body Dressing/Undressing ?Upper body dressing   ?What is the patient wearing?: Pull over shirt ?   ?Upper body assist Assist Level: Set up assist ?   ?Lower Body Dressing/Undressing ?Lower body dressing ? ? ?   ?What is the patient wearing?: Pants ? ?  ? ?Lower body assist Assist for lower body dressing: Moderate Assistance - Patient 50 - 74% ?   ? ?Toileting ?Toileting Toileting Activity did not occur (Probation officer and hygiene only): N/A (no void or bm)  ?Toileting assist Assist for toileting: Moderate Assistance - Patient 50 - 74% ?  ?  ?Transfers ?Chair/bed transfer ? ?Transfers assist ?   ? ?Chair/bed transfer assist level: Moderate Assistance - Patient 50 - 74% ?  ?  ?Locomotion ?Ambulation ? ? ?Ambulation assist ? ?   ? ?Assist level: Moderate Assistance - Patient 50 - 74% ?Assistive device: Walker-rolling ?Max distance: 15  ? ?  Walk 10 feet activity ? ? ?Assist ?   ? ?Assist level: Moderate Assistance - Patient - 50 - 74% ?Assistive device: Walker-rolling  ? ?Walk 50 feet activity ? ? ?Assist Walk 50 feet with 2 turns activity did not occur: Safety/medical concerns ? ?  ?   ? ? ?Walk 150 feet activity ? ? ?Assist Walk 150 feet activity did not occur: Safety/medical concerns ? ?  ?  ?  ? ?Walk 10 feet on uneven surface  ?activity ? ? ?Assist Walk 10 feet on uneven surfaces activity did not occur: Safety/medical concerns ? ? ?  ?   ? ?Wheelchair ? ? ? ? ?Assist Is the patient using a wheelchair?: Yes ?Type of Wheelchair: Manual ?  ? ?Wheelchair assist level: Minimal Assistance - Patient > 75% ?Max wheelchair distance: 150  ? ? ?Wheelchair 50 feet with 2 turns activity ? ? ? ?Assist ? ?  ?  ? ? ?Assist Level: Minimal Assistance - Patient >  75%  ? ?Wheelchair 150 feet activity  ? ? ? ?Assist ?   ? ? ?Assist Level: Minimal Assistance - Patient > 75%  ? ?Blood pressure (!) 142/73, pulse 80, temperature 98.7 ?F (37.1 ?C), temperature source Oral, resp. rate 18, weight 70.1 kg, SpO2 97 %. ? ?Medical Problem List and Plan: ?1. 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            -patient may shower ?            -ELOS/Goals: 14-20 days S/MinA ?            -Continue CIR therapies including PT, OT Team conf in am  ?2.  Antithrombotics: ?-DVT/anticoagulation:  Pharmaceutical: Lovenox ?            -antiplatelet therapy: DAPT X 3 weeks followed by ASA alone ?3. Pain Management: tylenol prn ? -add voltaren gel to RIGHT knee. ?4. Mood: LCSW to follow for evaluation and support.  ?            -antipsychotic agents: N/a ?5. Neuropsych: This patient is capable of making decisions on her own behalf. ?6. Skin/Wound Care: routine pressure relief measures.  ?7. Fluids/Electrolytes/Nutrition: Monitor I/O.   ? ?  Latest Ref Rng & Units 01/07/2022  ?  5:51 AM 01/03/2022  ?  6:02 AM 01/01/2022  ?  4:13 AM  ?BMP  ?Glucose 70 - 99 mg/dL 122   130   124    ?BUN 6 - 20 mg/dL 22   17   20     ?Creatinine 0.44 - 1.00 mg/dL 1.42   1.62   1.66    ?Sodium 135 - 145 mmol/L 140   141   140    ?Potassium 3.5 - 5.1 mmol/L 3.9   4.0   3.9    ?Chloride 98 - 111 mmol/L 112   116   117    ?CO2 22 - 32 mmol/L 24   20   20     ?Calcium 8.9 - 10.3 mg/dL 8.0   7.8   8.2    ?  5/1 BUN sl increased. Encourage po ?8. HTN: Monitor BP TID--continue Avapro and metoprolol ?            ?Vitals:  ? 01/07/22 1931 01/08/22 0527  ?BP: (!) 161/82 (!) 142/73  ?Pulse: 79 80  ?Resp: 18 18  ?Temp: 98.6 ?F (37 ?C) 98.7 ?F (37.1 ?C)  ?SpO2: 100% 97%  ?5/2  bp controlled- some lability  ?9.T2DM: Hgb A1C- 7.1 trending down as compliant with diet but not meds.  ?            --monitor BS ac/hs and use SSI for elevated BS ?            --BS variable from 74 to 235.  ?--Per  [facility]--no meds picked up since Oct/Rx for basiglar and ozempic not picked up yet.  ?--Will start low dose Amaryl due to CKD.   ?CBG (last 3)  ?Recent Labs  ?  01/07/22 ?1623 01/07/22 ?2105 01/08/22 ?0614  ?GLUCAP 170* 192* 165*  ? ? ?5/2 increase amaryl to 2mg  daily ?10. History of depression/anxiety: Continue Celexa.  ?11. Acute on chronic renal failure: SCr 1.33 at admission-->1.66 ?            -5/1 Cr 1.42 ?12. Insomnia: Secondary to anxiety. Trazodone prn added. ?--monitor for now.  ?13. Overweight: BMI 25.72: provide dietary education ?  ?14.  Anemia no obvious blood loss, Negative stool  guaic  ? ?  Latest Ref Rng & Units 01/07/2022  ?  5:51 AM 01/03/2022  ?  6:02 AM 12/31/2021  ?  1:17 PM  ?CBC  ?WBC 4.0 - 10.5 K/uL 9.9   9.0   12.4    ?Hemoglobin 12.0 - 15.0 g/dL 9.6   9.6   11.5    ?Hematocrit 36.0 - 46.0 % 28.7   29.0   35.4    ?Platelets 150 - 400 K/uL 256   202   241    ?  5/1 hgb stable ?LOS: ?6 days ?A FACE TO FACE EVALUATION WAS PERFORMED ? ?Luanna Salk Sumiya Mamaril ?01/08/2022, 8:51 AM  ? ?  ?

## 2022-01-08 NOTE — Progress Notes (Signed)
Speech Language Pathology Daily Session Note ? ?Patient Details  ?Name: Stacey Lucas ?MRN: [id number] ?Date of Birth: 10/03/1975 ? ?Today's Date: 01/08/2022 ?SLP Individual Time: 8101-7510 ?SLP Individual Time Calculation (min): 55 min ? ?Short Term Goals: ?Week 1: SLP Short Term Goal 1 (Week 1): Pt will demonstrate 90% speech intelligibility in conversation and during structured tasks with min A verbal cues. ?SLP Short Term Goal 2 (Week 1): Pt will demonstrate higher level word finding skills with min A semantic cues. ?SLP Short Term Goal 3 (Week 1): Pt will complete functional mildly complex problem solving tasks (medication/money/time management) with min A verbal cues. ?SLP Short Term Goal 4 (Week 1): Pt will demonstrate recall of novel and functional information with internal and external strategies given min A verbal cues. ? ?Skilled Therapeutic Interventions: Skilled ST treatment focused on cognitive-communication goals. SLP facilitated session by reviewing memory strategies discussed during ST session yesterday. Pt recalled "WARM" strategies with mod I by referring to handout that was provided. Pt also described "grouping" strategy in detail to recall her medication regime with 100% accuracy when using external aid. SLP provided training on additional memory strategies using phone (I.e., calendar/alarm). Pt utilized calendar app by entering an event to her personal phone with sup A verbal cues. SLP facilitated "Solving Daily Math Problems" via ALFA with score 9/10 without cues. Pt continues to present with improved speech intelligibility, as perceived as 100% intelligible at the conversation level with mod I for implementation of speech strategies.Patient was left in recliner with alarm activated and immediate needs within reach at end of session. Continue per current plan of care.   ?   ? ?Pain ?Pain Assessment ?Pain Scale: 0-10 ?Pain Score: 7  ?Pain Location: Knee ?Pain Orientation: Right;Left ?Pain  Descriptors / Indicators: Aching;Discomfort ?Pain Onset: On-going ?Pain Intervention(s): Medication (See eMAR) ? ?Therapy/Group: Individual Therapy ? ?Burgandy Hackworth T Cherylene Ferrufino ?01/08/2022, 9:02 AM ?

## 2022-01-08 NOTE — Progress Notes (Signed)
Hypoglycemic Event ? ?CBG: 62 ? ? ?Treatment: 8 oz juice/soda ? ?Symptoms: Hungry ? ?Follow-up CBG: Time:1215 ? CBG Result:93 ? ? ?Possible Reasons for Event: Inadequate meal intake ? ?Comments/MD notified:n/a ? ? ? ?Stacey Lucas ? ? ?

## 2022-01-09 DIAGNOSIS — I6300 Cerebral infarction due to thrombosis of unspecified precerebral artery: Secondary | ICD-10-CM | POA: Diagnosis not present

## 2022-01-09 LAB — GLUCOSE, CAPILLARY
Glucose-Capillary: 174 mg/dL — ABNORMAL HIGH (ref 70–99)
Glucose-Capillary: 156 mg/dL — ABNORMAL HIGH (ref 70–99)
Glucose-Capillary: 256 mg/dL — ABNORMAL HIGH (ref 70–99)
Glucose-Capillary: 253 mg/dL — ABNORMAL HIGH (ref 70–99)

## 2022-01-09 MED ORDER — METOPROLOL SUCCINATE ER 50 MG PO TB24
75.0000 mg | ORAL_TABLET | Freq: Every day | ORAL | Status: DC
  Administered 2022-01-10 – 2022-01-17 (×8): 75 mg via ORAL
  Filled 2022-01-09 (×8): qty 1

## 2022-01-09 NOTE — Patient Care Conference (Signed)
Inpatient RehabilitationTeam Conference and Plan of Care Update ?Date: 01/09/2022   Time: 10:14 AM  ? ? ?Patient Name: Stacey Lucas      ?Medical Record Number: [id number]  ?Date of Birth: 1976-08-22 ?Sex: Female         ?Room/Bed: 0Y63Z/8H88F-02 ?Payor Info: Payor: Pharmacist, hospital / Plan: [employer] PPO / Product Type: *No Product type* /   ? ?Admit Date/Time:  01/02/2022  3:39 PM ? ?Primary Diagnosis:  CVA (cerebral vascular accident) (HCC) ? ?Hospital Problems: Principal Problem: ?  CVA (cerebral vascular accident) (HCC) ? ? ? ?Expected Discharge Date: Expected Discharge Date: 01/17/22 ? ?Team Members Present: ?Physician leading conference: Dr. Claudette Laws ?Social Worker Present: Lavera Guise, BSW ?Nurse Present: Chana Bode, RN ?PT Present: Grier Rocher, PT ?OT Present: Annye English, OT ?SLP Present: Eilene Ghazi, SLP ?PPS Coordinator present : Fae Pippin, SLP ? ?   Current Status/Progress Goal Weekly Team Focus  ?Bowel/Bladder ? ?   Continent fo bowel and bladder; little residuals with PVRs        ?Swallow/Nutrition/ Hydration ? ?           ?ADL's ? ? cga to min A ambulatory bathroom transfers with RW, close S to CGA overall for bathing at shower level, dressing, grooming  S bathroom transfers/IADL, mod I BADL  transfer retraining, RUE NMR, balance/self-care retraining, pt/family education   ?Mobility ? ? Bed mobility = SUP; Transfers = CGA/ light MinA for balance; Ambulation = CGA/ intermittent light MinA for balance; Stairs = MinA with BHR  Overall SUP  RLE>L NMR, static and dynamic standing balance, ambulation with and without AD, family educ   ?Communication ? ? mod I; speech improved when dentures are in place  mod I  Carry over of speech intelligiblity strategies at the conversation level   ?Safety/Cognition/ Behavioral Observations ? sup A  sup A  Complex problem solving, recall with use of internal/external aids   ?Pain ? ?   N/A ?        ?Skin ? ?            ? ? ?Discharge Planning:  ?Discharging home with daughter and boyfriends to assist. Has another daughter-unreliable, sister able to assist as well   ?Team Discussion: ?BP med adjusted per MD, CBGs variable. Working on high level carry over strategies, problem solving, memory and recall.  ? ?Patient on target to meet rehab goals: ?yes, currently needs close supervision for lower body care at the shower level and set up for upper body seated. Needs CGA for toilet transfers and supervision for stand transfers due to posterior loss of balance. Able to ambulate up to 150' with CGA and supervision for turns.  Goals for discharge set for supervision - CGA.  ? ?*See Care Plan and progress notes for long and short-term goals.  ? ?Revisions to Treatment Plan:  ?Recommend neuro psych consult for counseling  ?Patient with mild foot drop on the left;footup brace trial ? ?Teaching Needs: ?Safety, strategies, medications, secondary risk management, etc  ?Current Barriers to Discharge: ?Decreased caregiver support and Home enviroment access/layout ? ?Possible Resolutions to Barriers: ?Family education ?OP follow up services ?DME: TTB ?  ? ? Medical Summary ?Current Status: Blood pressure still elevated, blood sugars elevated ? Barriers to Discharge: Medical stability;Medication compliance;Other (comments) ? Barriers to Discharge Comments: Substance abuse history is risk factor for CVA ?Possible Resolutions to [employer] Focus: Neuropsych for counseling, continue medication management for blood pressure and blood sugar ? ? ?  Continued Need for Acute Rehabilitation Level of Care: The patient requires daily medical management by a physician with specialized training in physical medicine and rehabilitation for the following reasons: ?Direction of a multidisciplinary physical rehabilitation program to maximize functional independence : Yes ?Medical management of patient stability for increased activity during participation in an  intensive rehabilitation regime.: Yes ?Analysis of laboratory values and/or radiology reports with any subsequent need for medication adjustment and/or medical intervention. : Yes ? ? ?I attest that I was present, lead the team conference, and concur with the assessment and plan of the team. ? ? ?Chana Bode B ?01/09/2022, 4:14 PM  ? ? ? ? ? ? ?

## 2022-01-09 NOTE — Plan of Care (Signed)
?  Problem: RH Balance ?Goal: LTG Patient will maintain dynamic standing balance (PT) ?Description: LTG:  Patient will maintain dynamic standing balance with assistance during mobility activities (PT) ?Flowsheets (Taken 01/09/2022 1020) ?LTG: Pt will maintain dynamic standing balance during mobility activities with:: Independent with assistive device  ?Note: Upgraded due to faster than expected progress.  ?  ?Problem: RH Bed to Chair Transfers ?Goal: LTG Patient will perform bed/chair transfers w/assist (PT) ?Description: LTG: Patient will perform bed to chair transfers with assistance (PT). ?Flowsheets (Taken 01/09/2022 1020) ?LTG: Pt will perform Bed to Chair Transfers with assistance level: Independent with assistive device  ?Note: Upgraded due to faster than expected progress.  ?  ?Problem: RH Car Transfers ?Goal: LTG Patient will perform car transfers with assist (PT) ?Description: LTG: Patient will perform car transfers with assistance (PT). ?Flowsheets (Taken 01/09/2022 1020) ?LTG: Pt will perform car transfers with assist:: Supervision/Verbal cueing ?Note: Upgraded due to faster than expected progress.  ?  ?Problem: RH Furniture Transfers ?Goal: LTG Patient will perform furniture transfers w/assist (OT/PT) ?Description: LTG: Patient will perform furniture transfers  with assistance (OT/PT). ?Flowsheets (Taken 01/09/2022 1020) ?LTG: Pt will perform furniture transfers with assist:: Independent with assistive device  ?Note: Upgraded due to faster than expected progress.  ?  ?Problem: RH Ambulation ?Goal: LTG Patient will ambulate in controlled environment (PT) ?Description: LTG: Patient will ambulate in a controlled environment, # of feet with assistance (PT). ?Flowsheets ?Taken 01/09/2022 1020 ?LTG: Pt will ambulate in controlled environ  assist needed:: Independent with assistive device ?Taken 01/03/2022 1543 ?LTG: Ambulation distance in controlled environment: 167ft with LRAD ?Note: Upgraded due to faster than expected  progress.  ?Goal: LTG Patient will ambulate in home environment (PT) ?Description: LTG: Patient will ambulate in home environment, # of feet with assistance (PT). ?Flowsheets ?Taken 01/09/2022 1020 ?LTG: Pt will ambulate in home environ  assist needed:: Independent with assistive device ?Taken 01/03/2022 1543 ?LTG: Ambulation distance in home environment: 4ft with LRAD ?Note: Upgraded due to faster than expected progress.  ?  ?Problem: RH Wheelchair Mobility ?Goal: LTG Patient will propel w/c in controlled environment (PT) ?Description: LTG: Patient will propel wheelchair in controlled environment, # of feet with assist (PT) ?Flowsheets ?Taken 01/09/2022 1020 ?LTG: Pt will propel w/c in controlled environ  assist needed:: Supervision/Verbal cueing ?Taken 01/03/2022 1543 ?LTG: Propel w/c distance in controlled environment: 183ft ?Note: Upgraded due to faster than expected progress.  ?  ?Problem: RH Pre-functional/Other (Specify) ?Goal: RH LTG PT (Specify) 1 ?Description:  RH LTG PT (Specify) 1 ?Flowsheets (Taken 01/03/2022 1543) ?LTG: Other PT (Specify) 1: pt will improve Berg balance scale score to >35 to indicate improved function and safety with ADLs ?  ?

## 2022-01-09 NOTE — Plan of Care (Signed)
?  Problem: Consults ?Goal: RH STROKE PATIENT EDUCATION ?Description: See Patient Education module for education specifics  ?Outcome: Progressing ?  ?Problem: RH SAFETY ?Goal: RH STG ADHERE TO SAFETY PRECAUTIONS W/ASSISTANCE/DEVICE ?Description: STG Adhere to Safety Precautions With cues Assistance/Device. ?Outcome: Progressing ?  ?Problem: RH KNOWLEDGE DEFICIT ?Goal: RH STG INCREASE KNOWLEDGE OF DIABETES ?Description: Patient and family will be able to manage DM with medications and dietary modifications using handouts and educational resources independently ?Outcome: Progressing ?Goal: RH STG INCREASE KNOWLEDGE OF HYPERTENSION ?Description: Patient and family will be able to manage HTN with medications and dietary modifications using handouts and educational resources independently ?Outcome: Progressing ?Goal: RH STG INCREASE KNOWLEGDE OF HYPERLIPIDEMIA ?Description: Patient and family will be able to manage HLD with medications and dietary modifications using handouts and educational resources independently ?Outcome: Progressing ?Goal: RH STG INCREASE KNOWLEDGE OF STROKE PROPHYLAXIS ?Description: Patient and family will be able to manage Secondary stroke with medications and dietary modifications using handouts and educational resources independently ?Outcome: Progressing ?  ?

## 2022-01-09 NOTE — Progress Notes (Signed)
Occupational Therapy Session Note ? ?Patient Details  ?Name: Stacey Lucas ?MRN: [id number] ?Date of Birth: 05/22/1976 ? ?Today's Date: 01/09/2022 ?OT Individual Time: 6387-5643 ?OT Individual Time Calculation (min): 68 min + 38 min  ? ? ?Short Term Goals: ?Week 1:  OT Short Term Goal 1 (Week 1): Pt will complete standing grooming task with min A. ?OT Short Term Goal 2 (Week 1): Pt will don pants with min A. ?OT Short Term Goal 3 (Week 1): Pt will complete toilet transfer min A and with LRAD. ? ?Skilled Therapeutic Interventions/Progress Updates:  ?  Session 1 [phone]) : Pt received semi-reclined in bed, no c/o pain but reports poor sleep, agreeable to therapy. Session focus on self-care retraining, activity tolerance, RUE NMR/FMC in prep for improved ADL/IADL/func mobility performance + decreased caregiver burden. ? ?Mod I for bed mobility. Completed grooming tasks and hair care EOB with set-up A  and increased time due to R hemiparesis. Donned pants with  close S and use of RW. Attempted to don B shoes but unable to pull over gripper socks. ? ?Total A w/c transport > gym. Pt later ambulated back with close S , cues to not push too hard through BUE and for upright posture. ? ?Pt participated in various therac to target RUE NMR/FMC and static standing balance including, placing/flipping over velcro cards on vertical surface, placing/removing level 1-4 resistive clothes pins on vertical bar via pincer grasp, and practicing handwriting. Rates handwriting as "F" compared to an "A" prior to first CVA.  ? ?Boyfriend in room with no questions, concerned for pt motivation in completing HEP at home. Discussed strategies to make HEP adherence feasible. Issued RW bag to increase ind with item transport at home as pt will be home alone majority of time.  ? ?Pt left seated EOB with boyfriend present with bed alarm engaged, call bell in reach, and all immediate needs met.  ? ? ?Session 2 [id number]) : Pt received in bed with  boyfriend present, no c/o pain, agreeable to therapy. Session focus on self-care retraining, activity tolerance, transfer retraining, RUE/RLE strengthening in prep for improved ADL/IADL/func mobility performance + decreased caregiver burden. ? ?Ambulated throughout session with RW and close S, cues for upright posture and staying within RW frame.  ? ?Pt attempted step-over tub transfer, but difficulty getting LLE over tub and balancing on RLE safely. Pt is able to complete traditional TTB transfer with close S and cues for technique. Will order TTB instead of shower stool, pt reports it should fit in her bathroom. Pt additionally completed recliner and low couch transfer with CGA to close S due to low height. Discussed meal prep/home set-up at home. Pt typically will microwave meals, make coffee, and boil eggs, otherwise has assistance. Pt typically uses rollator in kitchen to have chair/item transport.  ? ?To target RUE/RLE strengthening, pt transitioned to quadruped on mat table and able to hold for 3x30 seconds. Attempted to complete lego design with LUE and only RUE support weight with great difficulty, needed to come on elbows to complete. ? ?Pt left seated EOB with boyfriend present with bed alarm engaged, call bell in reach, and all immediate needs met.  ? ? ?Therapy Documentation ?Precautions:  ?Precautions ?Precautions: Fall ?Precaution Comments: R charcot foot, R HP ?Restrictions ?Weight Bearing Restrictions: No ? ?Pain: see session notes ?ADL: See Care Tool for more details. ? ? ?Therapy/Group: Individual Therapy ? ?Volanda Napoleon MS, OTR/L ? ?01/09/2022, 6:53 AM ?

## 2022-01-09 NOTE — Progress Notes (Signed)
Physical Therapy Session Note ? ?Patient Details  ?Name: Stacey Lucas ?MRN: [id number] ?Date of Birth: 1976/06/16 ? ?Today's Date: 01/09/2022 ?PT Individual Time: 0900-1000 ?PT Individual Time Calculation (min): 60 min  ? ?Short Term Goals: ?Week 1:  PT Short Term Goal 1 (Week 1): Pt will transfer to and from [facility] with min assist consistently ?PT Short Term Goal 2 (Week 1): Pt will Ambulate 66f with min assist and LRAD ?PT Short Term Goal 3 (Week 1): Pt will propell WC with supervision assist ?PT Short Term Goal 4 (Week 1): Pt will improve Berg balance scale by >7 points to demonstrate improved functional movement ? ?Skilled Therapeutic Interventions/Progress Updates:  ? ?Pt received sitting in  EOB and agreeable to PT. Sit<>stand from bed with supervision assist and BUE pushing from bed. Gait traingin through hall with RW x 1554fto rehab gym with cues for posture and step height on BLE.  ? ?Gait training in  maxisky: ?with RW x 3082fnd no AD 2 x 34f52fild LOB in turns requiring HHA to prevent posterior LOB when fatigued.  ?Side stepping R and L with RW supervision assist x 10ft68f, and then no AD with min assist x 10ft 72f Weave through 6 cones with RW and no AD x 1 each with min assist assist and cues for safety through turns. ?Therapeutic rest break between bouts required due to BLE fatigue. Noted continues foot slap on the LLE, will attempt use of foot up brace in the near future.  ? ?Pt returned to room and performed  stand pivot transfer to bed with RW and supervision assist. Sit>supine completed without assist, and left supine in bed with call bell in reach and all needs met.  ? ? ? ? ?   ? ?Therapy Documentation ?Precautions:  ?Precautions ?Precautions: Fall ?Precaution Comments: R charcot foot, R HP ?Restrictions ?Weight Bearing Restrictions: No ? ?  ?Vital Signs: ?Therapy Vitals ?Temp: 97.8 ?F (36.6 ?C) ?Temp Source: Oral ?Pulse Rate: 74 ?Resp: 15 ?BP: 136/89 ?Patient Position (if appropriate):  Sitting ?Oxygen Therapy ?SpO2: 100 % ?O2 Device: Room Air ?Pain: ?denies ? ? ?Therapy/Group: Individual Therapy ? ?AustinLorie Phenix2023, 5:35 PM  ?

## 2022-01-09 NOTE — Progress Notes (Signed)
?                                                       PROGRESS NOTE ? ? ?Subjective/Complaints: ? ?Seen in PT , using Maxi Sky, discussed B Foot drop from prior stroke on Left side  ? ?ROS: Patient denies CP, SOB , N/V/D ? ? ?Objective: ?  ?No results found. ? ?Recent Labs  ?  01/07/22 ?9292  ?WBC 9.9  ?HGB 9.6*  ?HCT 28.7*  ?PLT 256  ? ? ?Recent Labs  ?  01/07/22 ?4462  ?NA 140  ?K 3.9  ?CL 112*  ?CO2 24  ?GLUCOSE 122*  ?BUN 22*  ?CREATININE 1.42*  ?CALCIUM 8.0*  ? ? ? ?Intake/Output Summary (Last 24 hours) at 01/09/2022 8638 ?Last data filed at 01/09/2022 0919 ?Gross per 24 hour  ?Intake 598 ml  ?Output --  ?Net 598 ml  ? ?  ? ?  ? ?Physical Exam: ?Vital Signs ?Blood pressure (!) 160/84, pulse 78, temperature 98.4 ?F (36.9 ?C), temperature source Oral, resp. rate 14, weight 70.1 kg, SpO2 100 %. ? ? ? ?General: No acute distress ?Mood and affect are appropriate ?Heart: Regular rate and rhythm no rubs murmurs or extra sounds ?Lungs: Clear to auscultation, breathing unlabored, no rales or wheezes ?Abdomen: Positive bowel sounds, soft nontender to palpation, nondistended ?Extremities: No clubbing, cyanosis, or edema ?Skin: No evidence of breakdown, no evidence of rash ? ? ?Skin: No evidence of breakdown, no evidence of rash ?Neurologic: Dysarthria Cranial nerves II through XII intact, motor strength is 3/5 in Right and 4- Left  deltoid, bicep, tricep, grip, hip flexor, knee extensors, ankle dorsiflexor and plantar flexor ?Sensory exam normal for light touch and pain in all 4 limbs. No limb ataxia or cerebellar signs. No abnormal tone appreciated.   ?Musculoskeletal: Full range of motion in all 4 extremities. L knee mild medial tenderness no swelling or effusion ? ? ?Assessment/Plan: ?1. Functional deficits which require 3+ hours per day of interdisciplinary therapy in a comprehensive inpatient rehab setting. ?Physiatrist is providing close team supervision and 24 hour management of active medical problems listed  below. ?Physiatrist and rehab team continue to assess barriers to discharge/monitor patient progress toward functional and medical goals ? ?Care Tool: ? ?Bathing ?   ?Body parts bathed by patient: Right arm, Left arm, Chest, Abdomen  ?   ?  ?  ?Bathing assist Assist Level: Set up assist ?  ?  ?Upper Body Dressing/Undressing ?Upper body dressing   ?What is the patient wearing?: Pull over shirt ?   ?Upper body assist Assist Level: Set up assist ?   ?Lower Body Dressing/Undressing ?Lower body dressing ? ? ?   ?What is the patient wearing?: Pants ? ?  ? ?Lower body assist Assist for lower body dressing: Moderate Assistance - Patient 50 - 74% ?   ? ?Toileting ?Toileting Toileting Activity did not occur (Probation officer and hygiene only): N/A (no void or bm)  ?Toileting assist Assist for toileting: Moderate Assistance - Patient 50 - 74% ?  ?  ?Transfers ?Chair/bed transfer ? ?Transfers assist ?   ? ?Chair/bed transfer assist level: Moderate Assistance - Patient 50 - 74% ?  ?  ?Locomotion ?Ambulation ? ? ?Ambulation assist ? ?   ? ?Assist level: Moderate Assistance - Patient 50 - 74% ?  Assistive device: Walker-rolling ?Max distance: 15  ? ?Walk 10 feet activity ? ? ?Assist ?   ? ?Assist level: Moderate Assistance - Patient - 50 - 74% ?Assistive device: Walker-rolling  ? ?Walk 50 feet activity ? ? ?Assist Walk 50 feet with 2 turns activity did not occur: Safety/medical concerns ? ?  ?   ? ? ?Walk 150 feet activity ? ? ?Assist Walk 150 feet activity did not occur: Safety/medical concerns ? ?  ?  ?  ? ?Walk 10 feet on uneven surface  ?activity ? ? ?Assist Walk 10 feet on uneven surfaces activity did not occur: Safety/medical concerns ? ? ?  ?   ? ?Wheelchair ? ? ? ? ?Assist Is the patient using a wheelchair?: Yes ?Type of Wheelchair: Manual ?  ? ?Wheelchair assist level: Minimal Assistance - Patient > 75% ?Max wheelchair distance: 150  ? ? ?Wheelchair 50 feet with 2 turns activity ? ? ? ?Assist ? ?  ?  ? ? ?Assist Level:  Minimal Assistance - Patient > 75%  ? ?Wheelchair 150 feet activity  ? ? ? ?Assist ?   ? ? ?Assist Level: Minimal Assistance - Patient > 75%  ? ?Blood pressure (!) 160/84, pulse 78, temperature 98.4 ?F (36.9 ?C), temperature source Oral, resp. rate 14, weight 70.1 kg, SpO2 100 %. ? ?Medical Problem List and Plan: ?1. 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            -patient may shower ?            -ELOS/Goals: 14-20 days S/MinA ?            -Continue CIR therapies including PT, OT  ?Team conference today please see physician documentation under team conference tab, met with team  to discuss problems,progress, and goals. Formulized individual treatment plan based on medical history, underlying problem and comorbidities. ? ?2.  Antithrombotics: ?-DVT/anticoagulation:  Pharmaceutical: Lovenox ?            -antiplatelet therapy: DAPT X 3 weeks followed by ASA alone ?3. Pain Management: tylenol prn ? -add voltaren gel to RIGHT knee. ?4. Mood: LCSW to follow for evaluation and support.  ?            -antipsychotic agents: N/a ?5. Neuropsych: This patient is capable of making decisions on her own behalf. ?6. Skin/Wound Care: routine pressure relief measures.  ?7. Fluids/Electrolytes/Nutrition: Monitor I/O.   ? ?  Latest Ref Rng & Units 01/07/2022  ?  5:51 AM 01/03/2022  ?  6:02 AM 01/01/2022  ?  4:13 AM  ?BMP  ?Glucose 70 - 99 mg/dL 122   130   124    ?BUN 6 - 20 mg/dL '22   17   20    ' ?Creatinine 0.44 - 1.00 mg/dL 1.42   1.62   1.66    ?Sodium 135 - 145 mmol/L 140   141   140    ?Potassium 3.5 - 5.1 mmol/L 3.9   4.0   3.9    ?Chloride 98 - 111 mmol/L 112   116   117    ?CO2 22 - 32 mmol/L '24   20   20    ' ?Calcium 8.9 - 10.3 mg/dL 8.0   7.8   8.2    ?  5/1 BUN sl increased. Encourage po ?8. HTN: Monitor BP TID--continue Avapro and metoprolol ?            ?  Vitals:  ? 01/08/22 2120 01/09/22 0410  ?BP: (!) 141/85 (!) 160/84  ?Pulse: 83 78  ?Resp:  14  ?Temp:  98.4 ?F (36.9  ?C)  ?SpO2: 97% 100%  ?5/3 still elevated systolic will increase metoprolol  ?9.T2DM: Hgb A1C- 7.1 trending down as compliant with diet but not meds.  ?            --monitor BS ac/hs and use SSI for elevated BS ?            --BS variable from 74 to 235.  ?--Per [facility]--no meds picked up since Oct/Rx for basiglar and ozempic not picked up yet.  ?--Will start low dose Amaryl due to CKD.   ?CBG (last 3)  ?Recent Labs  ?  01/08/22 ?1704 01/08/22 ?2122 01/09/22 ?0640  ?GLUCAP 271* 187* 174*  ? ? ?5/2 increase amaryl to 40m daily, monitor response  ?10. History of depression/anxiety: Continue Celexa.  ?11. Acute on chronic renal failure: SCr 1.33 at admission-->1.66 ?            -5/1 Cr 1.42 ?12. Insomnia: Secondary to anxiety. Trazodone prn added. ?--monitor for now.  ?13. Overweight: BMI 25.72: provide dietary education ?  ?14.  Anemia no obvious blood loss, Negative stool  guaic  ? ?  Latest Ref Rng & Units 01/07/2022  ?  5:51 AM 01/03/2022  ?  6:02 AM 12/31/2021  ?  1:17 PM  ?CBC  ?WBC 4.0 - 10.5 K/uL 9.9   9.0   12.4    ?Hemoglobin 12.0 - 15.0 g/dL 9.6   9.6   11.5    ?Hematocrit 36.0 - 46.0 % 28.7   29.0   35.4    ?Platelets 150 - 400 K/uL 256   202   241    ?  5/1 hgb stable ?LOS: ?7 days ?A FACE TO FACE EVALUATION WAS PERFORMED ? ?ALuanna SalkKirsteins ?01/09/2022, 9:27 AM  ? ?  ?

## 2022-01-09 NOTE — Progress Notes (Signed)
Patient ID: Stacey Lucas, female   DOB: 1976-04-11, 46 y.o.   MRN: [id number] ? ?Team Conference Report to Patient/Family ? ?Team Conference discussion was reviewed with the patient and caregiver, including goals, any changes in plan of care and target discharge date.  Patient and caregiver express understanding and are in agreement.  The patient has a target discharge date of 01/17/22. ? ?SW met with patient and S.O. and provided team conference updates. Both are very pleased with patient progress. Patient and spouse report that the patient does not have a rolling walker but has a rollator and would like to see if it is appropriate to bring in and practice. No additional questions or concerns, sw will continue to follow up. ? ?Stacey Lucas ?01/09/2022, 2:38 PM  ?

## 2022-01-09 NOTE — Progress Notes (Signed)
Speech Language Pathology Weekly Progress and Session Note ? ?Patient Details  ?Name: Stacey Lucas ?MRN: [id number] ?Date of Birth: 1976/05/23 ? ?Beginning of progress report period: January 02, 2022 ?End of progress report period: Jan 09, 2022 ? ?Today's Date: 01/09/2022 ?SLP Individual Time: 1443-1540 ?SLP Individual Time Calculation (min): 45 min ? ?Short Term Goals: ?Week 1: SLP Short Term Goal 1 (Week 1): Pt will demonstrate 90% speech intelligibility in conversation and during structured tasks with min A verbal cues. ?SLP Short Term Goal 1 - Progress (Week 1): Met ?SLP Short Term Goal 2 (Week 1): Pt will demonstrate higher level word finding skills with min A semantic cues. ?SLP Short Term Goal 2 - Progress (Week 1): Met ?SLP Short Term Goal 3 (Week 1): Pt will complete functional mildly complex problem solving tasks (medication/money/time management) with min A verbal cues. ?SLP Short Term Goal 3 - Progress (Week 1): Met ?SLP Short Term Goal 4 (Week 1): Pt will demonstrate recall of novel and functional information with internal and external strategies given min A verbal cues. ?SLP Short Term Goal 4 - Progress (Week 1): Met ? ?New Short Term Goals: ?Week 2: SLP Short Term Goal 1 (Week 2): STG=LTG due to ELOS ? ?Weekly Progress Updates: Patient has made excellent gains and has met 4 of 4 STGs this reporting period due to improved complex problem solving, short-term memory with use of internal/external memory strategies, speech intelligible, and high level word finding. Patient is currently completing higher level cognitive tasks with overall supervision A verbal cues to complete functional and complex tasks accurately and safely; speech intelligibility and high level word finding skills at the conversation level with mod I. Patient and family education is ongoing. Patient would benefit from continued skilled SLP intervention on a short-term basis to maximize cognitive functioning and overall functional independence  prior to discharge.  Pt and boyfriend feel pt is at/near cognitive baseline. SLP discussed reducing intensity to 1-3x per week and likely d/c from skilled ST services soon following additional education/carry over of high level problem solving and memory strategies. Pt verbalized agreement.  ?  ?Intensity: Minumum of 1-2 x/day, 30 to 90 minutes ?Frequency: 1 to 3 out of 7 days ?Duration/Length of Stay: 5/11 ?Treatment/Interventions: Cognitive remediation/compensation;Cueing hierarchy;Functional tasks;Medication managment;Patient/family education;Speech/Language facilitation;Internal/external aids ? ? ?Daily Session ?Skilled Therapeutic Interventions: Skilled ST treatment focused on cognitive goals. Pt was accompanied by her boyfriend Marjory Lies this date who reports pt is likely near/at cognitive baseline per his observations and interactions. Pt reported that following her CVA she initially felt slower to process and "fuzzy", however feels this has resolved. Pt also supporting cognitive baseline. SLP discussed reducing intensity to 1-3x per week and likely d/c from skilled ST services soon following additional education/carry over of high level problem solving and memory strategies. Pt verbalized agreement. SLP facilitated session by providing a complex problem solving and organization task in which the patient had to generate a schedule when given a list of appointments, errands and time constraints. Pt completed similar tasks x3 with sup A verbal cues for organizing, prioritizing information, and deductive reasoning. Patient was left in bed with alarm activated and immediate needs within reach at end of session. Continue per current plan of care.     ? ?General  ?  ?Pain ?  ? ?Therapy/Group: Individual Therapy ? ?Hussam Muniz T Myrissa Chipley ?01/09/2022, 3:33 PM ? ? ? ? ? ? ?

## 2022-01-10 DIAGNOSIS — I6300 Cerebral infarction due to thrombosis of unspecified precerebral artery: Secondary | ICD-10-CM | POA: Diagnosis not present

## 2022-01-10 LAB — GLUCOSE, CAPILLARY
Glucose-Capillary: 100 mg/dL — ABNORMAL HIGH (ref 70–99)
Glucose-Capillary: 88 mg/dL (ref 70–99)
Glucose-Capillary: 168 mg/dL — ABNORMAL HIGH (ref 70–99)
Glucose-Capillary: 423 mg/dL — ABNORMAL HIGH (ref 70–99)
Glucose-Capillary: 285 mg/dL — ABNORMAL HIGH (ref 70–99)

## 2022-01-10 MED ORDER — BUPROPION HCL ER (XL) 150 MG PO TB24
150.0000 mg | ORAL_TABLET | Freq: Every day | ORAL | Status: DC
  Administered 2022-01-10 – 2022-01-17 (×8): 150 mg via ORAL
  Filled 2022-01-10 (×9): qty 1

## 2022-01-10 NOTE — Progress Notes (Signed)
Speech Language Pathology Daily Session Note ? ?Patient Details  ?Name: Stacey Lucas ?MRN: [id number] ?Date of Birth: 02/26/1976 ? ?Today's Date: 01/10/2022 ?SLP Individual Time: 3762-8315 ?SLP Individual Time Calculation (min): 45 min ? ?Short Term Goals: ?Week 2: SLP Short Term Goal 1 (Week 2): STG=LTG due to ELOS ? ?Skilled Therapeutic Interventions: Skilled ST treatment focused on cognitive goals. SLP facilitated session by administering the [facility] Mental Status (SLUMS). Pt scored a 26/30 with score of 27 of 30 considered within the normal range. Pt primarily exhibited increased difficulty with short-term memory elements and excelled in other domains. Pt demonstrated improved retrieval when given minimal semantic cues. Pt endorsed memory deficits at baseline following initial CVA and is modified independent with use of compensatory memory strategies. When SLP inquired about iADLs, pt indicated interest in using a pillbox at discharge to manage medications. Recommend pillbox organization during following SLP session. Pt verbalized agreement. Patient was left in bed with alarm activated and immediate needs within reach at end of session. Continue per current plan of care.   ?   ? ?Pain ?Pain Assessment ?Pain Scale: 0-10 ?Pain Score: 0-No pain ? ?Therapy/Group: Individual Therapy ? ?Ayona Yniguez T Kyandre Okray ?01/10/2022, 4:26 PM ?

## 2022-01-10 NOTE — Plan of Care (Signed)
?  Problem: RH Balance ?Goal: LTG Patient will maintain dynamic standing with ADLs (OT) ?Description: LTG:  Patient will maintain dynamic standing balance with assist during activities of daily living (OT)  ?Flowsheets (Taken 01/10/2022 1444) ?LTG: Pt will maintain dynamic standing balance during ADLs with: Independent with assistive device ?Note: Upgraded based on progress ?  ?Problem: RH Bathing ?Goal: LTG Patient will bathe all body parts with assist levels (OT) ?Description: LTG: Patient will bathe all body parts with assist levels (OT) ?Flowsheets (Taken 01/10/2022 1444) ?LTG: Pt will perform bathing with assistance level/cueing: Independent with assistive device  ?Note: Upgraded based on progress ?  ?Problem: RH Functional Use of Upper Extremity ?Goal: LTG Patient will use RT/LT upper extremity as a (OT) ?Description: LTG: Patient will use right/left upper extremity as a stabilizer/gross assist/diminished/nondominant/dominant level with assist, with/without cues during functional activity (OT) ?Flowsheets (Taken 01/10/2022 1444) ?LTG: Pt will use upper extremity in functional activity with assistance level of: Independent with assistive device ?Note: Upgraded based on progress ?  ?Problem: RH Simple Meal Prep ?Goal: LTG Patient will perform simple meal prep w/assist (OT) ?Description: LTG: Patient will perform simple meal prep with assistance, with/without cues (OT). ?Flowsheets (Taken 01/10/2022 1444) ?LTG: Pt will perform simple meal prep with assistance level of: Independent with assistive device ?Note: Upgraded based on progress ?  ?Problem: RH Toilet Transfers ?Goal: LTG Patient will perform toilet transfers w/assist (OT) ?Description: LTG: Patient will perform toilet transfers with assist, with/without cues using equipment (OT) ?Flowsheets (Taken 01/10/2022 1444) ?LTG: Pt will perform toilet transfers with assistance level of: Independent with assistive device ?Note: Upgraded based on progress ?  ?Problem: RH  Tub/Shower Transfers ?Goal: LTG Patient will perform tub/shower transfers w/assist (OT) ?Description: LTG: Patient will perform tub/shower transfers with assist, with/without cues using equipment (OT) ?Flowsheets (Taken 01/10/2022 1444) ?LTG: Pt will perform tub/shower stall transfers with assistance level of: Independent with assistive device ?Note: Upgraded based on progress ?  ?

## 2022-01-10 NOTE — Progress Notes (Signed)
Physical Therapy Session Note ? ?Patient Details  ?Name: Stacey Lucas ?MRN: [id number] ?Date of Birth: 12/21/1975 ? ?Today's Date: 01/10/2022 ?PT Individual Time: 1224-8250 ?PT Individual Time Calculation (min): 38 min  ? ?Short Term Goals: ?Week 1:  PT Short Term Goal 1 (Week 1): Pt will transfer to and from [facility] with min assist consistently ?PT Short Term Goal 2 (Week 1): Pt will Ambulate 66ft with min assist and LRAD ?PT Short Term Goal 3 (Week 1): Pt will propell WC with supervision assist ?PT Short Term Goal 4 (Week 1): Pt will improve Berg balance scale by >7 points to demonstrate improved functional movement ? ?Skilled Therapeutic Interventions/Progress Updates:  ?  Pt received supine in bed, awake and agreeable to therapy session. Supine>sitting R EOB, HOB elevated but not using bedrail, with distant supervision. Sitting EOB donned pants and shoes set-up assist with distant supervision - no indication of trunk instability. Sit<>stands using RW with CGA for steadying during session. Standing with CGA pulled pants up over hips without assist. ? ?Gait training ~156ft to main therapy gym using RW with CGA for safety - demos excessive B LE hip external rotation, L foot slap due to foot drop - excessive trunk/hip flexion requiring repeated cuing for improved upright posture - slow gait speed, especially for patient's age.  ? ?Provided pt with socks and donned L LE [place]-on AFO - educated pt on need for different shoes.  ? ?Gait training ~11ft using RW with CGA for steadying - noticed L LE AFO results in improved heel strike of L LE during initial contact - when focused has decreased hip external rotation, which is an improvement. ? ?Transitioned to gait training ~20ft using L HHA with light min assist for balance - demos improved upright posture without RW and increased gait speed. ? ?Gait training ~135ft with L HHA and heavier min assist especially when turning due to R>L lean/LOB - continues to have improved  upright posture though more impaired balance stability compared to when using RW - decreasing in L hip external rotation and continued improved L foot placement on initial contact. ? ?Pt states she has to "get used to" the AFO stating it "feels like I have 2 different feet."  ? ?Gait training ~168ft back to room using RW with CGA wearing AFO - continues to demo improvements as noted above. Pt left seated EOB with needs in reach and bed alarm on. ? ? ?Therapy Documentation ?Precautions:  ?Precautions ?Precautions: Fall ?Precaution Comments: R charcot foot, R HP ?Restrictions ?Weight Bearing Restrictions: No ? ? ?Pain: ? States "just a little bit in my right knee" - no intervention needed - provided rest breaks as needed. ? ? ?Therapy/Group: Individual Therapy ? ?Ginny Forth , PT, [name], NCS, CSRS ?01/10/2022, 7:50 AM  ?

## 2022-01-10 NOTE — Progress Notes (Signed)
Occupational Therapy Weekly Progress Note ? ?Patient Details  ?Name: Stacey Lucas ?MRN: [id number] ?Date of Birth: Apr 10, 1976 ? ?Beginning of progress report period: January 03, 2022 ?End of progress report period: Jan 10, 2022 ? ?Today's Date: 01/10/2022 ?OT Individual Time: 2130-8657 ?OT Individual Time Calculation (min): 53 min  ? ? ?Patient has met 3 of 3 short term goals.  Pt has made excellent progress this week towards LTG. Pt has demonstrated improved ability to power up into standing, functional use of RUE, dynamic standing balance, activity tolerance, ability to compensate for deficits to presently complete bathing and dressing at close S level with use of grab bars/RW and ambulatory bathroom transfers with the use of RW and close S / CGA. Pt cont to be primarily limited by baseline R HP and L foot drop. Anticipate intermittent S and no physical assist required upon DC.  ? ? ?Patient continues to demonstrate the following deficits: muscle weakness, decreased cardiorespiratoy endurance, impaired timing and sequencing, unbalanced muscle activation, and decreased coordination, decreased problem solving, and decreased standing balance, hemiplegia, and decreased balance strategies and therefore will continue to benefit from skilled OT intervention to enhance overall performance with BADL, iADL, and Reduce care partner burden. ? ?Patient progressing toward long term goals..   Based on excellent progress, will upgrade pt goals to mod I overall.  ? ?OT Short Term Goals ?Week 1:  OT Short Term Goal 1 (Week 1): Pt will complete standing grooming task with min A. ?OT Short Term Goal 1 - Progress (Week 1): Met ?OT Short Term Goal 2 (Week 1): Pt will don pants with min A. ?OT Short Term Goal 2 - Progress (Week 1): Met ?OT Short Term Goal 3 (Week 1): Pt will complete toilet transfer min A and with LRAD. ?OT Short Term Goal 3 - Progress (Week 1): Met ?Week 2:  OT Short Term Goal 1 (Week 2): STG = LTG 2/2 ELOS ? ?Skilled  Therapeutic Interventions/Progress Updates:  ?  Pt received seated EOB, no c/o pain and, agreeable to therapy. Session focus on transfer retraining, activity tolerance,  RUE NMR, dynamic standing balance in prep for improved ADL/IADL/func mobility performance + decreased caregiver burden. Declined need for ADL. Ambulated to and from gyms throughout session with Rw and close S to CGA due to brushing up against R sided objects and new L AFO, heel popping out of shoe. Cues to point toes forward due to L>R hip ER.  ? ?Pt completed the following games on BITS to target RUE NMR and static standing balance: ? ?Game:single target with .75 lb wrist weight on RUE ?Accuracy: 89.36% Reaction Time: 1.42 secs Duration Time: 2 min  Hits: 84 ?Accuracy: 88.35% Reaction Time: 1.31 secs Duration Time: 2 min Hits: 91 ? ?Discussed fall recovery, pt reports having had one fall when going to the bathroom after first CVA, required significant physical assist from partner to get off floor. After demonstration, pt completed floor transfer with min to mod physical assist and cues for safe sequencing to get back to mat table.  ? ? ?Pt completed 1x10 of the following with RUE + 4 lb dumbell: horizontal forward/backward circles, overhead reaches, forward and cross body punches.  ? ?Pt additionally, practiced handwriting with dominant R hand, continues to self grade accuracy as "F," but noted minimal improvement in legibility since yesterday. ? ?Pt left seated EOB with bed alarm engaged, call bell in reach, and all immediate needs met.  ? ? ?Therapy Documentation ?Precautions:  ?Precautions ?Precautions: Fall ?  Precaution Comments: R charcot foot, R HP ?Restrictions ?Weight Bearing Restrictions: No ? ?Pain: denies ?  ?ADL: See Care Tool for more details. ? ? ?Therapy/Group: Individual Therapy ? ?Volanda Napoleon MS, OTR/L ? ?01/10/2022, 6:48 AM  ?

## 2022-01-10 NOTE — Evaluation (Incomplete)
Recreational Therapy Assessment and Plan ? ?Patient Details  ?Name: Stacey Lucas ?MRN: [id number] ?Date of Birth: 07-May-1976 ?Today's Date: 01/10/2022 ? ?Rehab Potential:  Good ?ELOS:   d/c 5/11 ? ?Assessment ?Hospital Problem: Principal Problem: ?  CVA (cerebral vascular accident) ([place]) ?  ?  ?Past Medical History:  ?    ?Past Medical History:  ?Diagnosis Date  ? Anxiety and depression    ? Cocaine abuse ([place])    ? Depression with suicidal ideation    ?  [facility] admission 04/2018  ? Diabetes mellitus without complication ([place])    ?  Type II  ? GERD (gastroesophageal reflux disease)    ? Hyperlipidemia    ? Hypertension    ? Impingement syndrome of right shoulder    ? Migraine headache    ? Osteoarthritis of right knee 05/24/2021  ? Pilonidal abscess 05/2013  ? Pyelonephritis    ? Right thyroid nodule    ? Sciatica    ?  ?Past Surgical History:  ?     ?Past Surgical History:  ?Procedure Laterality Date  ? BUBBLE STUDY   01/01/2022  ?  Procedure: BUBBLE STUDY;  Surgeon: Janina Mayo, MD;  Location: [place];  Service: Cardiovascular;;  ? TEE WITHOUT CARDIOVERSION N/A 01/01/2022  ?  Procedure: TRANSESOPHAGEAL ECHOCARDIOGRAM (TEE);  Surgeon: Janina Mayo, MD;  Location[facility] ENDOSCOPY;  Service: Cardiovascular;  Laterality: N/A;  ? tubal      ?  ?  ?Assessment & Plan ?Clinical Impression: Patient is a 46 y.o. year old female with  with history of uncontrolled T2DM with neuropathy/RLE weakness, CVA 09/22, thyroid nodule, depression, ongoing cocaine abuse who was admitted on 12/29/21 with left sided weakness, left facial weakness and slurred speech which resolved transiently but then developed near flaccidity of RUE, RLE>LLE drift, slurred speech and right facial droop while in ED. UDS positive for cocaine. CTA head/neck was negative for LVO, showed moderate to severe focal stenosis in mid left M2 MCA vessel as well as right thyroid nodule. Elevated BP treated with IV labetalol/clevidipine and she received TNK.   Follow up  MRI brain revealed acute infarct in left corona radiata and basal ganglia with chronic small vessel infarcts in bilateral CR/BG.  2D echo showed EF 70-75% with mild LVH and follow up TEE showed EF 60-65% with negative bubble study, no intra-atrial shunt or cardiac thrombus.  ?  ?EEG negative for seizures. Hospital course significant for Acute on chronic renal failure with rise in SCr to 1.66 as well as leucocytosis due to strep agalactiae UTI. She was started on ceftriaxone X 3 doses today.  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 Leonie Man recommended DAPT X 3 weeks followed by ASA alone. She has also been educated on cocaine and tobacco cessation. To follow up with PCP for evaluation of thyroid nodule. She continues to be limited by right sided weakness with left inattention (from prior stroke?), right lean as well as right knee instability with standing attempts. CIR recommended due to functional decline. Currently complains of low back pain. ?Patient transferred to CIR on 01/02/2022 .   ?  ? ?Pt presents with decreased activity tolerance, decreased functional mobility, decreased balance, feelings of stress Limiting pt's independence with leisure/community pursuits. ?   ?Met with pt today to initiate discussion on TR services including leisure education, activity analysis/modifications and stress management.  Pt anticipating a call from her father as he had not been able to contact her due  to hospital phone issue.  Father called during this session.  Evaluation incomplete at this time.  ? ?Plan ?  ? ?Recommendations for other services: None  ? ?Discharge Criteria: Patient will be discharged from TR if patient refuses treatment 3 consecutive times without medical reason.  If treatment goals not met, if there is a change in medical status, if patient makes no progress towards goals or if patient is discharged from hospital. ? ?The above assessment, treatment plan, treatment alternatives and goals  were discussed and mutually agreed upon: by patient ? ?Lynden Flemmer ?01/10/2022, 3:57 PM  ?

## 2022-01-10 NOTE — Progress Notes (Signed)
Physical Therapy Session Note ? ?Patient Details  ?Name: Stacey Lucas ?MRN: [id number] ?Date of Birth: [birth year] ? ?Today's Date: 01/10/2022 ?PT Individual Time: 1005-1100 ?PT Individual Time Calculation (min): 55 min  ? ?Short Term Goals: ?Week 1:  PT Short Term Goal 1 (Week 1): Pt will transfer to and from [facility] with min assist consistently ?PT Short Term Goal 2 (Week 1): Pt will Ambulate 57f with min assist and LRAD ?PT Short Term Goal 3 (Week 1): Pt will propell WC with supervision assist ?PT Short Term Goal 4 (Week 1): Pt will improve Berg balance scale by >7 points to demonstrate improved functional movement ? ?Skilled Therapeutic Interventions/Progress Updates:  ? ?Pt received supine in bed and agreeable to PT. Supine>sit transfer with supervision assist and cues decreased use of bed rail as able.  ? ?Stand pivot transfers performed throughout session with supervision assist and RW. Cues for posture and step length on the LLE ? ?Gait training with RW and LAFO noted to have excessive ER and poor foot placement on in shoe with AFO. Gait with no AFO with RW x 1019fwith foot slap and intermittent ftoe drag, and gait with RW  then performed with foot up brace(shoeless) x [id number] rehab unit with improved heel contact and hip alignment.  ?Gait training on unlevel cement surface with foot up brace with RW x [id number] 180ft 28f supervision assist and cues for safety over cracks.  ? ?Pt returned to room and performed stand pivot transfer to bed with supervision assist and RW as listed. Sit>supine completed without assist, and left supine in bed with call bell in reach and all needs met.  ? ?   ? ?Therapy Documentation ?Precautions:  ?Precautions ?Precautions: Fall ?Precaution Comments: R charcot foot, R HP ?Restrictions ?Weight Bearing Restrictions: No ? ?Vital Signs: ?Therapy Vitals ?Temp: 98.5 ?F (36.9 ?C) ?Temp Source: Oral ?Pulse Rate: 76 ?Resp: 15 ?BP: (!) 142/88 ?Patient Position (if appropriate):  Sitting ?Oxygen Therapy ?SpO2: 100 % ?O2 Device: Room Air ?Pain: ?Pain Assessment ?Pain Scale: 0-10 ?Pain Score: 0-No pain ? ? ?Therapy/Group: Individual Therapy ? ?AustinLorie Phenix2023, 4:40 PM  ?

## 2022-01-10 NOTE — Progress Notes (Signed)
Patient ID: Stacey Lucas, female   DOB: [birth year], 46 y.o.   MRN: [id number] ? ?OP orders sent to [facility] ?

## 2022-01-10 NOTE — Progress Notes (Signed)
Patient ID: Stacey Lucas, female   DOB: Nov 10, 1975, 46 y.o.   MRN: [id number] ? ?Transfer Bench ordered through [facility]  ?

## 2022-01-10 NOTE — Progress Notes (Signed)
Orthopedic Tech Progress Note ?Patient Details:  ?Tanaiya Kolarik ?10-27-1975 ?[id number] ? ?Patient ID: Gaylen Pereira, female   DOB: [birth year], 46 y.o.   MRN: [id number] ? ?Delorise Royals Ellis Koffler ?01/10/2022, 4:22 PM ?Placed order with [facility]. ?

## 2022-01-10 NOTE — Discharge Instructions (Addendum)
Inpatient Rehab Discharge Instructions ? ?Stacey Lucas ?Discharge date and time:  01/17/22 ? ?Activities/Precautions/ Functional Status: ?Activity: activity as tolerated ?Diet: cardiac diet and diabetic diet ?Wound Care: none needed ? ? ?Functional status:  ?___ No restrictions     ___ Walk up steps independently ?___ 24/7 supervision/assistance   ___ Walk up steps with assistance ?_x__ Intermittent supervision/assistance  ___ Bathe/dress independently ?___ Walk with walker     ___ Bathe/dress with assistance ?___ Walk Independently    ___ Shower independently ?___ Walk with assistance    _X__ Shower with STAND BY assistance ?_X__ No alcohol/Marijuana/Cocaine    ___ Return to work/school ________ ? ? ? ?COMMUNITY REFERRALS UPON DISCHARGE:   ? ? ?Outpatient: PT     OT    ST     ?            Agency: [facility]  Phone: [phone]  ?            Appointment Date/Time: TBD ? ?Medical Equipment/Items Ordered: Tub Transfer Bench ?                                                Agency/Supplier: [employer] [phone] ? ?Special Instructions: ? ? ? ?STROKE/TIA DISCHARGE INSTRUCTIONS ?SMOKING Cigarette smoking nearly doubles your risk of having a stroke & is the single most alterable risk factor  ?If you smoke or have smoked in the last 12 months, you are advised to quit smoking for your health. Most of the excess cardiovascular risk related to smoking disappears within a year of stopping. ?Ask you doctor about anti-smoking medications ?[place] Quit Line: 1-800-QUIT NOW ?Free Smoking Cessation Classes [phone]  ?CHOLESTEROL Know your levels; limit fat & cholesterol in your diet  ?Lipid Panel  ?   ?Component Value Date/Time  ? CHOL 202 (H) 12/30/2021 0305  ? TRIG 153 (H) 12/30/2021 0305  ? HDL 54 12/30/2021 0305  ? CHOLHDL 3.7 12/30/2021 0305  ? VLDL 31 12/30/2021 0305  ? LDLCALC 117 (H) 12/30/2021 0305  ? ? ? Many patients benefit from treatment even if their cholesterol is at goal. ?Goal: Total Cholesterol (CHOL) less  than 160 ?Goal:  Triglycerides (TRIG) less than 150 ?Goal:  HDL greater than 40 ?Goal:  LDL (LDLCALC) less than 100 ?  ?BLOOD PRESSURE American Stroke Association blood pressure target is less that 120/80 mm/Hg  ?Your discharge blood pressure is:  BP: 119/65 Monitor your blood pressure ?Limit your salt and alcohol intake ?Many individuals will require more than one medication for high blood pressure  ?DIABETES (A1c is a blood sugar average for last 3 months) Goal HGBA1c is under 7% (HBGA1c is blood sugar average for last 3 months)  ?Diabetes: ?   ? ?Lab Results  ?Component Value Date  ? HGBA1C 7.1 (H) 12/29/2021  ? ? Your HGBA1c can be lowered with medications, healthy diet, and exercise. ?Check your blood sugar as directed by your physician ?Call your physician if you experience unexplained or low blood sugars.  ?PHYSICAL ACTIVITY/REHABILITATION Goal is 30 minutes at least 4 days per week  ?Activity: No driving, ?Therapies: see above ?Return to work: N/A Activity decreases your risk of heart attack and stroke and makes your heart stronger.  It helps control your weight and blood pressure; helps you relax and can improve your mood. ?Participate in a regular exercise  program. ?Talk with your doctor about the best form of exercise for you (dancing, walking, swimming, cycling).  ?DIET/WEIGHT Goal is to maintain a healthy weight  ?Your discharge diet is:  ?Diet Order   ? ?       ?  Diet heart healthy/carb modified Room service appropriate? Yes with Assist; Fluid consistency: Thin  Diet effective now       ?  ? ?  ?  ? ?  ?  liquids ?Your height is:    ?Your current weight is: Weight: 76.4 kg ?Your Body Mass Index (BMI) is:  BMI (Calculated): 28.03 Following the type of diet specifically designed for you will help prevent another stroke. ?Your goal weight is:   ?Your goal Body Mass Index (BMI) is 19-24. ?Healthy food habits can help reduce 3 risk factors for stroke:  High cholesterol, hypertension, and excess weight.   ?RESOURCES Stroke/Support Group:  Call [phone] ?  ?STROKE EDUCATION PROVIDED/REVIEWED AND GIVEN TO PATIENT Stroke warning signs and symptoms ?How to activate emergency medical system (call 911). ?Medications prescribed at discharge. ?Need for follow-up after discharge. ?Personal risk factors for stroke. ?Pneumonia vaccine given:  ?Flu vaccine given:  ?My questions have been answered, the writing is legible, and I understand these instructions.  I will adhere to these goals & educational materials that have been provided to me after my discharge from the hospital.  ? ?  ? ?My questions have been answered and I understand these instructions. I will adhere to these goals and the provided educational materials after my discharge from the hospital. ? ?Patient/Caregiver Signature _______________________________ Date __________ ? ?Clinician Signature _______________________________________ Date __________ ? ?Please bring this form and your medication list with you to all your follow-up doctor's appointments.   ?

## 2022-01-10 NOTE — Progress Notes (Signed)
?                                                       PROGRESS NOTE ? ? ?Subjective/Complaints: ? ?Doing OT in  room, discussed d/c date , pt satisfied.  Pt states she wants to stay off tobacco post d/c would like something for this  ? ?ROS: Patient denies CP, SOB , N/V/D ? ? ?Objective: ?  ?No results found. ? ?No results for input(s): WBC, HGB, HCT, PLT in the last 72 hours. ? ?No results for input(s): NA, K, CL, CO2, GLUCOSE, BUN, CREATININE, CALCIUM in the last 72 hours. ? ? ?Intake/Output Summary (Last 24 hours) at 01/10/2022 0900 ?Last data filed at 01/10/2022 0849 ?Gross per 24 hour  ?Intake 354 ml  ?Output --  ?Net 354 ml  ? ?  ? ?  ? ?Physical Exam: ?Vital Signs ?Blood pressure (!) 135/93, pulse 86, temperature 98 ?F (36.7 ?C), temperature source Oral, resp. rate 18, weight 70.1 kg, SpO2 98 %. ? ? ? ?General: No acute distress ?Mood and affect are appropriate ?Heart: Regular rate and rhythm no rubs murmurs or extra sounds ?Lungs: Clear to auscultation, breathing unlabored, no rales or wheezes ?Abdomen: Positive bowel sounds, soft nontender to palpation, nondistended ?Extremities: No clubbing, cyanosis, or edema ?Skin: No evidence of breakdown, no evidence of rash ? ? ?Skin: No evidence of breakdown, no evidence of rash ?Neurologic: Dysarthria Cranial nerves II through XII intact, motor strength is 3/5 in Right and 4- Left  deltoid, bicep, tricep, grip, hip flexor, knee extensors, ankle dorsiflexor and plantar flexor ?Sensory exam normal for light touch and pain in all 4 limbs. No limb ataxia or cerebellar signs. No abnormal tone appreciated.   ?Musculoskeletal: Full range of motion in all 4 extremities. L knee mild medial tenderness no swelling or effusion ? ? ?Assessment/Plan: ?1. Functional deficits which require 3+ hours per day of interdisciplinary therapy in a comprehensive inpatient rehab setting. ?Physiatrist is providing close team supervision and 24 hour management of active medical problems listed  below. ?Physiatrist and rehab team continue to assess barriers to discharge/monitor patient progress toward functional and medical goals ? ?Care Tool: ? ?Bathing ?   ?Body parts bathed by patient: Right arm, Left arm, Chest, Abdomen  ?   ?  ?  ?Bathing assist Assist Level: Set up assist ?  ?  ?Upper Body Dressing/Undressing ?Upper body dressing   ?What is the patient wearing?: Pull over shirt ?   ?Upper body assist Assist Level: Set up assist ?   ?Lower Body Dressing/Undressing ?Lower body dressing ? ? ?   ?What is the patient wearing?: Pants ? ?  ? ?Lower body assist Assist for lower body dressing: Moderate Assistance - Patient 50 - 74% ?   ? ?Toileting ?Toileting Toileting Activity did not occur (Probation officer and hygiene only): N/A (no void or bm)  ?Toileting assist Assist for toileting: Moderate Assistance - Patient 50 - 74% ?  ?  ?Transfers ?Chair/bed transfer ? ?Transfers assist ?   ? ?Chair/bed transfer assist level: Moderate Assistance - Patient 50 - 74% ?  ?  ?Locomotion ?Ambulation ? ? ?Ambulation assist ? ?   ? ?Assist level: Moderate Assistance - Patient 50 - 74% ?Assistive device: Walker-rolling ?Max distance: 15  ? ?Walk 10 feet activity ? ? ?  Assist ?   ? ?Assist level: Moderate Assistance - Patient - 50 - 74% ?Assistive device: Walker-rolling  ? ?Walk 50 feet activity ? ? ?Assist Walk 50 feet with 2 turns activity did not occur: Safety/medical concerns ? ?  ?   ? ? ?Walk 150 feet activity ? ? ?Assist Walk 150 feet activity did not occur: Safety/medical concerns ? ?  ?  ?  ? ?Walk 10 feet on uneven surface  ?activity ? ? ?Assist Walk 10 feet on uneven surfaces activity did not occur: Safety/medical concerns ? ? ?  ?   ? ?Wheelchair ? ? ? ? ?Assist Is the patient using a wheelchair?: Yes ?Type of Wheelchair: Manual ?  ? ?Wheelchair assist level: Minimal Assistance - Patient > 75% ?Max wheelchair distance: 150  ? ? ?Wheelchair 50 feet with 2 turns activity ? ? ? ?Assist ? ?  ?  ? ? ?Assist Level:  Minimal Assistance - Patient > 75%  ? ?Wheelchair 150 feet activity  ? ? ? ?Assist ?   ? ? ?Assist Level: Minimal Assistance - Patient > 75%  ? ?Blood pressure (!) 135/93, pulse 86, temperature 98 ?F (36.7 ?C), temperature source Oral, resp. rate 18, weight 70.1 kg, SpO2 98 %. ? ?Medical Problem List and Plan: ?1. 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            -patient may shower ?            -ELOS/Goals: 14-20 days S/MinA ?            -Continue CIR therapies including PT, OT  ? ? ?2.  Antithrombotics: ?-DVT/anticoagulation:  Pharmaceutical: Lovenox ?            -antiplatelet therapy: DAPT X 3 weeks followed by ASA alone ?3. Pain Management: tylenol prn ? -add voltaren gel to RIGHT knee. ?4. Mood: LCSW to follow for evaluation and support.  ?            -antipsychotic agents: N/a ?5. Neuropsych: This patient is capable of making decisions on her own behalf. ?6. Skin/Wound Care: routine pressure relief measures.  ?7. Fluids/Electrolytes/Nutrition: Monitor I/O.   ? ?  Latest Ref Rng & Units 01/07/2022  ?  5:51 AM 01/03/2022  ?  6:02 AM 01/01/2022  ?  4:13 AM  ?BMP  ?Glucose 70 - 99 mg/dL 122   130   124    ?BUN 6 - 20 mg/dL 22   17   20     ?Creatinine 0.44 - 1.00 mg/dL 1.42   1.62   1.66    ?Sodium 135 - 145 mmol/L 140   141   140    ?Potassium 3.5 - 5.1 mmol/L 3.9   4.0   3.9    ?Chloride 98 - 111 mmol/L 112   116   117    ?CO2 22 - 32 mmol/L 24   20   20     ?Calcium 8.9 - 10.3 mg/dL 8.0   7.8   8.2    ?  5/1 BUN sl increased. Encourage po ?8. HTN: Monitor BP TID--continue Avapro and metoprolol ?            ?Vitals:  ? 01/09/22 2002 01/10/22 0453  ?BP: (!) 154/76 (!) 135/93  ?Pulse: 89 86  ?Resp: 18 18  ?Temp: 98.6 ?F (37 ?C) 98 ?F (36.7 ?C)  ?SpO2: 95% 98%  ?5/3 still elevated systolic will increase metoprolol  ?9.T2DM:  Hgb A1C- 7.1 trending down as compliant with diet but not meds.  ?            --monitor BS ac/hs and use SSI for elevated BS ?            --BS  variable from 74 to 235.  ?--Per [facility]--no meds picked up since Oct/Rx for basiglar and ozempic not picked up yet.  ?--Will start low dose Amaryl due to CKD.   ?CBG (last 3)  ?Recent Labs  ?  01/09/22 ?1651 01/09/22 ?2048 01/10/22 ?[id number]  ?GLUCAP 256* 253* 100*  ? ? ?5/2 increase amaryl to 2mg  daily, monitor response  ?10. History of depression/anxiety: Continue Celexa.  ?11. Acute on chronic renal failure: SCr 1.33 at admission-->1.66 ?            -5/1 Cr 1.42 ?12. Insomnia: Secondary to anxiety. Trazodone prn added. ?--monitor for now.  ?13. Overweight: BMI 25.72: provide dietary education ?  ?14.  Anemia no obvious blood loss, Negative stool  guaic  ? ?  Latest Ref Rng & Units 01/07/2022  ?  5:51 AM 01/03/2022  ?  6:02 AM 12/31/2021  ?  1:17 PM  ?CBC  ?WBC 4.0 - 10.5 K/uL 9.9   9.0   12.4    ?Hemoglobin 12.0 - 15.0 g/dL 9.6   9.6   11.5    ?Hematocrit 36.0 - 46.0 % 28.7   29.0   35.4    ?Platelets 150 - 400 K/uL 256   202   241    ?15.  Hx of tobacco abuse start bupropion 150mg  per day , plavix may boost effect , monitor for HA  ?LOS: ?8 days ?A FACE TO FACE EVALUATION WAS PERFORMED ? ?Luanna Salk Tahjay Binion ?01/10/2022, 9:00 AM  ? ?  ?

## 2022-01-11 DIAGNOSIS — I6300 Cerebral infarction due to thrombosis of unspecified precerebral artery: Secondary | ICD-10-CM | POA: Diagnosis not present

## 2022-01-11 LAB — GLUCOSE, CAPILLARY
Glucose-Capillary: 106 mg/dL — ABNORMAL HIGH (ref 70–99)
Glucose-Capillary: 124 mg/dL — ABNORMAL HIGH (ref 70–99)
Glucose-Capillary: 175 mg/dL — ABNORMAL HIGH (ref 70–99)
Glucose-Capillary: 228 mg/dL — ABNORMAL HIGH (ref 70–99)

## 2022-01-11 NOTE — Progress Notes (Signed)
Physical Therapy Weekly Progress Note ? ?Patient Details  ?Name: Stacey Lucas ?MRN: [id number] ?Date of Birth: [birth year] ? ?Beginning of progress report period: January 03, 2022 ?End of progress report period: Jan 11, 2022 ? ?Today's Date: 01/11/2022 ?PT Individual Time: 1305-1400 and 1535-1700 ?PT Individual Time Calculation (min): 55 min and 85 min  ? ?Patient has met 4 of 4 short term goals. Pt is making greater that expected progress towards LTG. SO present for session, but has not initiated hands on training. Pt has progressed to supervision assist with bed mobility, transfers, and gait with RW.   ? ?Patient continues to demonstrate the following deficits muscle weakness and muscle joint tightness, decreased coordination, and decreased standing balance, decreased postural control, hemiplegia, and decreased balance strategies and therefore will continue to benefit from skilled PT intervention to increase functional independence with mobility. ? ?Patient progressing toward long term goals..  Continue plan of care. ? ?PT Short Term Goals ?Week 1:  PT Short Term Goal 1 (Week 1): Pt will transfer to and from [facility] with min assist consistently ?PT Short Term Goal 1 - Progress (Week 1): Met ?PT Short Term Goal 2 (Week 1): Pt will Ambulate 56f with min assist and LRAD ?PT Short Term Goal 2 - Progress (Week 1): Met ?PT Short Term Goal 3 (Week 1): Pt will propell WC with supervision assist ?PT Short Term Goal 3 - Progress (Week 1): Met ?PT Short Term Goal 4 (Week 1): Pt will improve Berg balance scale by >7 points to demonstrate improved functional movement ?PT Short Term Goal 4 - Progress (Week 1): Met ?Week 2:  PT Short Term Goal 1 (Week 2): STG=LTG due to ELOS ? ?Skilled Therapeutic Interventions/Progress Updates:  ? ?Session 1.  ? ?Pt received supine in bed and agreeable to PT. Supine>sit transfer with supervision assist with cues for decreased use of rails.   ? ?[facility]  throughout session with cues for BUE position to . Push to standing.  ? ?Gait with orthotist present with foot-up brace and and no brace/AFO. Each performed x 50 with RW. Noted to have significant improvement in heel contact on the LLE with footup verses no brace. PT questioned need to dynamic DF assist brace. Orthotist recommended following up with outpatient upon d/c for additional bracing if necessary. .  ? ?Dynamicbalacne training to perform foot tap on 6 inch cone withBUE support 2 x 6 Bil with cues for posture and improved hip flexion throughout.  ? ?Dynamic gait training with fot up brace and RW to weave through 6 cones, step over hockey stick and up/down airex pad. CGA-supervision assist from PT for safety with cues for posture and AD management around obstacles. ' ? ?Pt returned to room and performed stand pivot transfer to bed as listed above. Sit>supine completed without assist and left supine in bed with call bell in reach and all needs met.  ? ? ?Session 2 . ? ?Pt received supine in bed and agreeable to PT. Supine>sit transfer with out assist or cues .Donning shoes sitting EOB with supervision assist and increased time. ? ?Gait trainng with RW and rollator over level surface through hospital and cement surface of sidewalk [id number] cues for AD parts management in turns and transfers. .  ? ?Patient demonstrates increased fall risk as noted by score of   22/56 on Berg Balance Scale.  (<36= high risk for falls, close to  100%; 37-45 significant >80%; 46-51 moderate >50%; 52-55 lower >25%) improved from 5/56 on 4/27 ? ?Patient returned to room and performed stand pivot to recliner with supervision assist and Rollator. Pt left sitting in recliner with call bell in reach and all needs met.   ? ?   ? ?Therapy Documentation ?Precautions:  ?Precautions ?Precautions: Fall ?Precaution Comments: R charcot foot, R HP ?Restrictions ?Weight Bearing Restrictions: No ? ?  ?Vital  Signs: ?Therapy Vitals ?Temp: 97.9 ?F (36.6 ?C) ?Temp Source: Oral ?Pulse Rate: 70 ?Resp: 17 ?BP: (!) 161/83 ?Patient Position (if appropriate): Sitting ?Oxygen Therapy ?SpO2: 100 % ?O2 Device: Room Air ?Pain: ?Pain Assessment ?Pain Scale: 0-10 ?Pain Score: 0-No pain ? ?Therapy/Group: Individual Therapy ? ?Lorie Phenix ?01/11/2022, 3:33 PM  ?

## 2022-01-11 NOTE — Plan of Care (Signed)
?  Problem: RH Expression Communication ?Goal: LTG Patient will increase speech intelligibility (SLP) ?Description: LTG: Patient will increase speech intelligibility at word/phrase/conversation level with cues, % of the time (SLP) ?Outcome: Completed/Met ?Goal: LTG Patient will increase word finding of common (SLP) ?Description: LTG:  Patient will increase word finding of common objects/daily info/abstract thoughts with cues using compensatory strategies (SLP). ?Outcome: Completed/Met ?  ?Problem: RH Problem Solving ?Goal: LTG Patient will demonstrate problem solving for (SLP) ?Description: LTG:  Patient will demonstrate problem solving for basic/complex daily situations with cues  (SLP) ?Outcome: Completed/Met ?  ?Problem: RH Memory ?Goal: LTG Patient will use memory compensatory aids to (SLP) ?Description: LTG:  Patient will use memory compensatory aids to recall biographical/new, daily complex information with cues (SLP) ?Outcome: Completed/Met ?  ?

## 2022-01-11 NOTE — Progress Notes (Addendum)
Speech Language Pathology Discharge Summary ? ?Patient Details  ?Name: Stacey Lucas ?MRN: [id number] ?Date of Birth: [birth year] ? ?Today's Date: 01/11/2022 ?SLP Individual Time: 0100-7121 ?SLP Individual Time Calculation (min): 45 min ? ? ?Skilled Therapeutic Interventions:  Pt seen this date for skilled ST intervention targeting cognitive-linguistic goals outlined in care plan. Pt encountered awake/alert and lying in bed. Agreeable to ST intervention. ? ?Pt completed semi-complex problem-solving, attention, and recall task (pill organizer) with 100% accuracy for 3 medications with Mod I. Utilized medication list with Mod I. Answered hypothetical medication questions re: safety, label interpretation, and administration with 100% accuracy with Mod I. Recalled activities completed in OT session without prompts. Upon inquiry, pt reports she feels she has reached her cognitive baseline. Pt education completed re: s/sx c/f CVA and information provided in health resource notebook. She verbalized understanding of all education with all questions answered to pt's satisfaction. At this time, discharge from [facility] intervention appears indicated with pt in agreement. Please see below for discharge summary.  ? ?Patient has met 4 of 4 long term goals.  Patient to discharge at overall Modified Independent;Supervision level.  ?Reasons goals not met:    ? ?Clinical Impression/Discharge Summary: Pt has demonstrated excellent progress during CIR admission and has met 4 out of 4 ST LTG's set at time of initial evaluation/admission. Pt education has been completed with all questions answered. Pt remains with subtle dysarthria, though is intelligible, and benefits from extra processing time and repetition at times. Recommend intermittent supervision with iADL tasks, as needed, upon discharge. ? ?Care Partner:  ?Caregiver Able to Provide Assistance: Yes  ?Type of Caregiver Assistance: Physical;Cognitive ? ?Recommendation:  ?Other (comment)  (intermittent supervision)  ?Rationale for SLP Follow Up: Maximize cognitive function and independence;Reduce caregiver burden;Maximize functional communication  ? ?Equipment: N/A  ? ?Reasons for discharge: Treatment goals met  ? ?Patient/Family Agrees with Progress Made and Goals Achieved: Yes  ? ? ?Nyleah Mcginnis A Terry Abila ?01/11/2022, 1:40 PM ? ?

## 2022-01-11 NOTE — Progress Notes (Signed)
?                                                       PROGRESS NOTE ? ? ?Subjective/Complaints: ? ?Pt has social issues at home , wants to get home as soon as possible but at the same time has little support  ? ?ROS: Patient denies CP, SOB , N/V/D ? ? ?Objective: ?  ?No results found. ? ?No results for input(s): WBC, HGB, HCT, PLT in the last 72 hours. ? ?No results for input(s): NA, K, CL, CO2, GLUCOSE, BUN, CREATININE, CALCIUM in the last 72 hours. ? ? ?Intake/Output Summary (Last 24 hours) at 01/11/2022 0859 ?Last data filed at 01/10/2022 1900 ?Gross per 24 hour  ?Intake 118 ml  ?Output --  ?Net 118 ml  ? ?  ? ?  ? ?Physical Exam: ?Vital Signs ?Blood pressure 126/65, pulse 73, temperature 98.4 ?F (36.9 ?C), temperature source Oral, resp. rate 18, weight 70.1 kg, SpO2 100 %. ? ? ? ?General: No acute distress ?Mood and affect are appropriate ?Heart: Regular rate and rhythm no rubs murmurs or extra sounds ?Lungs: Clear to auscultation, breathing unlabored, no rales or wheezes ?Abdomen: Positive bowel sounds, soft nontender to palpation, nondistended ?Extremities: No clubbing, cyanosis, or edema ?Skin: No evidence of breakdown, no evidence of rash ? ? ?Skin: No evidence of breakdown, no evidence of rash ?Neurologic: Dysarthria Cranial nerves II through XII intact, motor strength is 3/5 in Right and 4- Left  deltoid, bicep, tricep, grip, hip flexor, knee extensors, ankle dorsiflexor and plantar flexor ?Sensory exam normal for light touch and pain in all 4 limbs. No limb ataxia or cerebellar signs. No abnormal tone appreciated.   ?Musculoskeletal: Full range of motion in all 4 extremities. L knee mild medial tenderness no swelling or effusion ? ? ?Assessment/Plan: ?1. Functional deficits which require 3+ hours per day of interdisciplinary therapy in a comprehensive inpatient rehab setting. ?Physiatrist is providing close team supervision and 24 hour management of active medical problems listed below. ?Physiatrist and  rehab team continue to assess barriers to discharge/monitor patient progress toward functional and medical goals ? ?Care Tool: ? ?Bathing ?   ?Body parts bathed by patient: Right arm, Left arm, Chest, Abdomen  ?   ?  ?  ?Bathing assist Assist Level: Set up assist ?  ?  ?Upper Body Dressing/Undressing ?Upper body dressing   ?What is the patient wearing?: Pull over shirt ?   ?Upper body assist Assist Level: Set up assist ?   ?Lower Body Dressing/Undressing ?Lower body dressing ? ? ?   ?What is the patient wearing?: Pants ? ?  ? ?Lower body assist Assist for lower body dressing: Moderate Assistance - Patient 50 - 74% ?   ? ?Toileting ?Toileting Toileting Activity did not occur (AcupuncturistClothing management and hygiene only): N/A (no void or bm)  ?Toileting assist Assist for toileting: Moderate Assistance - Patient 50 - 74% ?  ?  ?Transfers ?Chair/bed transfer ? ?Transfers assist ?   ? ?Chair/bed transfer assist level: Moderate Assistance - Patient 50 - 74% ?  ?  ?Locomotion ?Ambulation ? ? ?Ambulation assist ? ?   ? ?Assist level: Moderate Assistance - Patient 50 - 74% ?Assistive device: Walker-rolling ?Max distance: 15  ? ?Walk 10 feet activity ? ? ?Assist ?   ? ?  Assist level: Moderate Assistance - Patient - 50 - 74% ?Assistive device: Walker-rolling  ? ?Walk 50 feet activity ? ? ?Assist Walk 50 feet with 2 turns activity did not occur: Safety/medical concerns ? ?  ?   ? ? ?Walk 150 feet activity ? ? ?Assist Walk 150 feet activity did not occur: Safety/medical concerns ? ?  ?  ?  ? ?Walk 10 feet on uneven surface  ?activity ? ? ?Assist Walk 10 feet on uneven surfaces activity did not occur: Safety/medical concerns ? ? ?  ?   ? ?Wheelchair ? ? ? ? ?Assist Is the patient using a wheelchair?: Yes ?Type of Wheelchair: Manual ?  ? ?Wheelchair assist level: Minimal Assistance - Patient > 75% ?Max wheelchair distance: 150  ? ? ?Wheelchair 50 feet with 2 turns activity ? ? ? ?Assist ? ?  ?  ? ? ?Assist Level: Minimal Assistance -  Patient > 75%  ? ?Wheelchair 150 feet activity  ? ? ? ?Assist ?   ? ? ?Assist Level: Minimal Assistance - Patient > 75%  ? ?Blood pressure 126/65, pulse 73, temperature 98.4 ?F (36.9 ?C), temperature source Oral, resp. rate 18, weight 70.1 kg, SpO2 100 %. ? ?Medical Problem List and Plan: ?1. 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            -patient may shower ?            -ELOS/Goals: 14-20 days S/MinA ?            -Continue CIR therapies including PT, OT  ? ? ?2.  Antithrombotics: ?-DVT/anticoagulation:  Pharmaceutical: Lovenox ?            -antiplatelet therapy: DAPT X 3 weeks followed by ASA alone ?3. Pain Management: tylenol prn ? -add voltaren gel to RIGHT knee. ?4. Mood: LCSW to follow for evaluation and support.  ?            -antipsychotic agents: N/a ?5. Neuropsych: This patient is capable of making decisions on her own behalf. ?6. Skin/Wound Care: routine pressure relief measures.  ?7. Fluids/Electrolytes/Nutrition: Monitor I/O.   ? ?  Latest Ref Rng & Units 01/07/2022  ?  5:51 AM 01/03/2022  ?  6:02 AM 01/01/2022  ?  4:13 AM  ?BMP  ?Glucose 70 - 99 mg/dL 768   115   726    ?BUN 6 - 20 mg/dL 22   17   20     ?Creatinine 0.44 - 1.00 mg/dL   2.03   5.59    ?Sodium 135 - 145 mmol/L 140   141   140    ?Potassium 3.5 - 5.1 mmol/L 3.9   4.0   3.9    ?Chloride 98 - 111 mmol/L 112   116   117    ?CO2 22 - 32 mmol/L 24   20   20     ?Calcium 8.9 - 10.3 mg/dL 8.0   7.8   8.2    ?  ?8. HTN: Monitor BP TID--continue Avapro and metoprolol ?            ?Vitals:  ? 01/10/22 1927 01/11/22 0540  ?BP: (!) 151/91 126/65  ?Pulse: 80 73  ?Resp: 18 18  ?Temp: 99.1 ?F (37.3 ?C) 98.4 ?F (36.9 ?C)  ?SpO2: 100% 100%  ?5/5 controlled  ?9.T2DM: Hgb A1C- 7.1 trending down as compliant with diet but not meds.  ?            --  monitor BS ac/hs and use SSI for elevated BS ?            --BS variable from 74 to 235.  ?--Per [facility]--no meds picked up since Oct/Rx for basiglar and  ozempic not picked up yet.  ?--Will start low dose Amaryl due to CKD.   ?CBG (last 3)  ?Recent Labs  ?  01/10/22 ?2055 01/10/22 ?2207 01/11/22 ?4403  ?GLUCAP 423* 285* 106*  ? ? ?Labile, ? If pt snacking , ask dietary to provide education  ?10. History of depression/anxiety: Continue Celexa. Buproprion added for smoking cessation  ?11. Acute on chronic renal failure: SCr 1.33 at admission-->1.66 ?            -5/1 Cr 1.42 ?12. Insomnia: Secondary to anxiety. Trazodone prn added. ?--monitor for now.  ?13. Overweight: BMI 25.72: provide dietary education ?  ?14.  Anemia no obvious blood loss, Negative stool  guaic  ? ?  Latest Ref Rng & Units 01/07/2022  ?  5:51 AM 01/03/2022  ?  6:02 AM 12/31/2021  ?  1:17 PM  ?CBC  ?WBC 4.0 - 10.5 K/uL 9.9   9.0   12.4    ?Hemoglobin 12.0 - 15.0 g/dL 9.6   9.6   47.4    ?Hematocrit 36.0 - 46.0 % 28.7   29.0   35.4    ?Platelets 150 - 400 K/uL 256   202   241    ?15.  Hx of tobacco abuse start bupropion 150mg  per day , plavix may boost effect , monitor for HA  ?LOS: ?9 days ?A FACE TO FACE EVALUATION WAS PERFORMED ? ? Brittanny Levenhagen ?01/11/2022, 8:59 AM  ? ?  ?

## 2022-01-11 NOTE — Progress Notes (Signed)
Patient's Blood sugar last evening was 423. On call PA Pam was notified. PA ordered to administer scheduled Semglee 5 units and re-check FS at 10 pm. 10 pm blood sugar -285, patient received 3 units of Novolg as scheduled.  ?

## 2022-01-11 NOTE — Plan of Care (Signed)
?  Problem: Consults ?Goal: RH STROKE PATIENT EDUCATION ?Description: See Patient Education module for education specifics  ?Outcome: Progressing ?  ?Problem: RH SAFETY ?Goal: RH STG ADHERE TO SAFETY PRECAUTIONS W/ASSISTANCE/DEVICE ?Description: STG Adhere to Safety Precautions With cues Assistance/Device. ?Outcome: Progressing ?  ?Problem: RH KNOWLEDGE DEFICIT ?Goal: RH STG INCREASE KNOWLEDGE OF DIABETES ?Description: Patient and family will be able to manage DM with medications and dietary modifications using handouts and educational resources independently ?Outcome: Progressing ?Goal: RH STG INCREASE KNOWLEDGE OF HYPERTENSION ?Description: Patient and family will be able to manage HTN with medications and dietary modifications using handouts and educational resources independently ?Outcome: Progressing ?Goal: RH STG INCREASE KNOWLEGDE OF HYPERLIPIDEMIA ?Description: Patient and family will be able to manage HLD with medications and dietary modifications using handouts and educational resources independently ?Outcome: Progressing ?Goal: RH STG INCREASE KNOWLEDGE OF STROKE PROPHYLAXIS ?Description: Patient and family will be able to manage Secondary stroke with medications and dietary modifications using handouts and educational resources independently ?Outcome: Progressing ?  ?

## 2022-01-11 NOTE — Progress Notes (Signed)
Occupational Therapy Session Note ? ?Patient Details  ?Name: Stacey Lucas ?MRN: [id number] ?Date of Birth: 08-17-1976 ? ?Today's Date: 01/11/2022 ?OT Individual Time: 0847-1000 ?OT Individual Time Calculation (min): 73 min  ? ? ?Short Term Goals: ?Week 1:  OT Short Term Goal 1 (Week 1): Pt will complete standing grooming task with min A. ?OT Short Term Goal 1 - Progress (Week 1): Met ?OT Short Term Goal 2 (Week 1): Pt will don pants with min A. ?OT Short Term Goal 2 - Progress (Week 1): Met ?OT Short Term Goal 3 (Week 1): Pt will complete toilet transfer min A and with LRAD. ?OT Short Term Goal 3 - Progress (Week 1): Met ? ?Skilled Therapeutic Interventions/Progress Updates:  ?  Pt greeted semi-reclined in bed and agreeable to OT treatment session focused on self-care retraining. Pt completed bed mobility with supervision. She then stood w/ RW and CGA. CGA to ambulate to the bathroom and sit on commode. Pt voided bladder and performed peri-care w/ supervision. Pt able to doff underwear and socks seated on toilet using figure 4 position and improved R LE strength to bring up towards L knee. Pt's phone rang and pt ambulated back to EOB with RW and CGA to answer phone. Pt sat EOB for quick phone call with supervision for balance. She then returned to shower in similar fashion and completed bathing tasks sit<>stand with focus on use of R UE's to manage wash cloth. Incorporated R hand fine motor control with opening containers and grasping objects during BADL tasks. Pt requested OT braid patients hair. While OT braided, pt worked on manipulating soft tan theraputty for hand exercise with focus on grip, pinch, and rotation. Pt ambulated back to bed with RW and CGA. Pt left semi-reclined in bed with bed alarm on, call bell in reach and needs met. ? ?Therapy Documentation ?Precautions:  ?Precautions ?Precautions: Fall ?Precaution Comments: R charcot foot, R HP ?Restrictions ?Weight Bearing Restrictions: No ? ?Pain: ?Denies  pain ? ?Therapy/Group: Individual Therapy ? ?Daneen Schick [name] ?01/11/2022, 9:04 AM ?

## 2022-01-12 DIAGNOSIS — E1169 Type 2 diabetes mellitus with other specified complication: Secondary | ICD-10-CM

## 2022-01-12 DIAGNOSIS — M1711 Unilateral primary osteoarthritis, right knee: Secondary | ICD-10-CM | POA: Diagnosis not present

## 2022-01-12 DIAGNOSIS — E669 Obesity, unspecified: Secondary | ICD-10-CM

## 2022-01-12 DIAGNOSIS — N289 Disorder of kidney and ureter, unspecified: Secondary | ICD-10-CM | POA: Diagnosis not present

## 2022-01-12 DIAGNOSIS — I10 Essential (primary) hypertension: Secondary | ICD-10-CM | POA: Diagnosis not present

## 2022-01-12 DIAGNOSIS — I639 Cerebral infarction, unspecified: Secondary | ICD-10-CM | POA: Diagnosis not present

## 2022-01-12 LAB — GLUCOSE, CAPILLARY
Glucose-Capillary: 138 mg/dL — ABNORMAL HIGH (ref 70–99)
Glucose-Capillary: 96 mg/dL (ref 70–99)
Glucose-Capillary: 146 mg/dL — ABNORMAL HIGH (ref 70–99)

## 2022-01-12 NOTE — Progress Notes (Signed)
Occupational Therapy Session Note ? ?Patient Details  ?Name: Stacey Lucas ?MRN: [id number] ?Date of Birth: 11/27/1975 ? ?Today's Date: 01/12/2022 ?OT Individual Time: 7510-2585 ?OT Individual Time Calculation (min): 42 min  ? ? ?Short Term Goals: ?Week 1:  OT Short Term Goal 1 (Week 1): Pt will complete standing grooming task with min A. ?OT Short Term Goal 1 - Progress (Week 1): Met ?OT Short Term Goal 2 (Week 1): Pt will don pants with min A. ?OT Short Term Goal 2 - Progress (Week 1): Met ?OT Short Term Goal 3 (Week 1): Pt will complete toilet transfer min A and with LRAD. ?OT Short Term Goal 3 - Progress (Week 1): Met ?Week 2:  OT Short Term Goal 1 (Week 2): STG = LTG 2/2 ELOS ? ?Skilled Therapeutic Interventions/Progress Updates:  ?  Pt received semi-reclined in bed, no c/o pain and, agreeable to therapy. Session focus on self-care retraining, activity tolerance, transfer retraining in prep for improved ADL/IADL/func mobility performance + decreased caregiver burden. ? ? Mod I for bed mobility, ambulated > toilet > walk-in shower with close S and RW. Completed toileting tasks post continent void of bladder and doffed clothing at seated S level. Transitioned to shower and bathed full-body at sit to stand level with close S and use of grab bars. Completed full-body dressing at EOB at same level. Did require min A to fasten buckle when donning toe brace, rec pt keep it fastened to facilitate ind in donning. ? ?Reports that her daughter had smashed her TV/dishes at home, reason unknown. Emotional support provided.  ? ?Pt ambulated around nurses station with rollator, close S due to occasionally catching rollator wheels on objects in halls. ? ?Pt left semi-reclined in bed with bed alarm engaged, call bell in reach, and all immediate needs met.  ? ? ?Therapy Documentation ?Precautions:  ?Precautions ?Precautions: Fall ?Precaution Comments: R charcot foot, R HP ?Restrictions ?Weight Bearing Restrictions: No ? ?Pain: ?  Ongoing R knee pain, reports being premedicated ?ADL: See Care Tool for more details. ? ? ?Therapy/Group: Individual Therapy ? ?Volanda Napoleon MS, OTR/L ? ?01/12/2022, 6:55 AM ?

## 2022-01-12 NOTE — Progress Notes (Signed)
?                                                       PROGRESS NOTE ? ? ?Subjective/Complaints: ? ?Patient is up in bed.  Spouse in room.  No problems this morning.  Patient seems to be in good spirits.  Still has some right knee pain. ? ?ROS: Patient denies fever, rash, sore throat, blurred vision, dizziness, nausea, vomiting, diarrhea, cough, shortness of breath or chest pain,  back/neck pain, headache, or mood change.  ? ? ?Objective: ?  ?No results found. ? ?No results for input(s): WBC, HGB, HCT, PLT in the last 72 hours. ? ?No results for input(s): NA, K, CL, CO2, GLUCOSE, BUN, CREATININE, CALCIUM in the last 72 hours. ? ? ?Intake/Output Summary (Last 24 hours) at 01/12/2022 1016 ?Last data filed at 01/12/2022 0700 ?Gross per 24 hour  ?Intake 537 ml  ?Output --  ?Net 537 ml  ?  ? ?  ? ?Physical Exam: ?Vital Signs ?Blood pressure (!) 141/76, pulse 77, temperature 98.4 ?F (36.9 ?C), temperature source Oral, resp. rate 18, weight 70.1 kg, SpO2 100 %. ? ? ? ?Constitutional: No distress . Vital signs reviewed. ?HEENT: NCAT, EOMI, oral membranes moist ?Neck: supple ?Cardiovascular: RRR without murmur. No JVD    ?Respiratory/Chest: CTA Bilaterally without wheezes or rales. Normal effort    ?GI/Abdomen: BS +, non-tender, non-distended ?Ext: no clubbing, cyanosis, or edema ?Psych: pleasant and cooperative  ?Skin: No evidence of breakdown, no evidence of rash ?Neurologic: Dysarthria Cranial nerves II through XII intact, motor strength is 3/5 in Right and 4- Left  deltoid, bicep, tricep, grip, hip flexor, knee extensors, ankle dorsiflexor and plantar flexor ?Sensory exam normal for light touch and pain in all 4 limbs. No limb ataxia or cerebellar signs. No abnormal tone appreciated.   ?Musculoskeletal: Full range of motion in all 4 extremities. L knee mild medial tenderness no swelling or effusion ? ? ?Assessment/Plan: ?1. Functional deficits which require 3+ hours per day of interdisciplinary therapy in a comprehensive  inpatient rehab setting. ?Physiatrist is providing close team supervision and 24 hour management of active medical problems listed below. ?Physiatrist and rehab team continue to assess barriers to discharge/monitor patient progress toward functional and medical goals ? ?Care Tool: ? ?Bathing ?   ?Body parts bathed by patient: Right arm, Left arm, Chest, Abdomen, Face, Left lower leg, Right lower leg, Right upper leg, Left upper leg, Buttocks, Front perineal area  ?   ?  ?  ?Bathing assist Assist Level: Contact Guard/Touching assist ?  ?  ?Upper Body Dressing/Undressing ?Upper body dressing   ?What is the patient wearing?: Pull over shirt ?   ?Upper body assist Assist Level: Set up assist ?   ?Lower Body Dressing/Undressing ?Lower body dressing ? ? ?   ?What is the patient wearing?: Pants, Underwear/pull up ? ?  ? ?Lower body assist Assist for lower body dressing: Contact Guard/Touching assist ?   ? ?Toileting ?Toileting Toileting Activity did not occur (Acupuncturist and hygiene only): N/A (no void or bm)  ?Toileting assist Assist for toileting: Contact Guard/Touching assist ?  ?  ?Transfers ?Chair/bed transfer ? ?Transfers assist ?   ? ?Chair/bed transfer assist level: Contact Guard/Touching assist ?  ?  ?Locomotion ?Ambulation ? ? ?Ambulation assist ? ?   ? ?  Assist level: Moderate Assistance - Patient 50 - 74% ?Assistive device: Walker-rolling ?Max distance: 15  ? ?Walk 10 feet activity ? ? ?Assist ?   ? ?Assist level: Moderate Assistance - Patient - 50 - 74% ?Assistive device: Walker-rolling  ? ?Walk 50 feet activity ? ? ?Assist Walk 50 feet with 2 turns activity did not occur: Safety/medical concerns ? ?  ?   ? ? ?Walk 150 feet activity ? ? ?Assist Walk 150 feet activity did not occur: Safety/medical concerns ? ?  ?  ?  ? ?Walk 10 feet on uneven surface  ?activity ? ? ?Assist Walk 10 feet on uneven surfaces activity did not occur: Safety/medical concerns ? ? ?  ?   ? ?Wheelchair ? ? ? ? ?Assist Is the  patient using a wheelchair?: Yes ?Type of Wheelchair: Manual ?  ? ?Wheelchair assist level: Minimal Assistance - Patient > 75% ?Max wheelchair distance: 150  ? ? ?Wheelchair 50 feet with 2 turns activity ? ? ? ?Assist ? ?  ?  ? ? ?Assist Level: Minimal Assistance - Patient > 75%  ? ?Wheelchair 150 feet activity  ? ? ? ?Assist ?   ? ? ?Assist Level: Minimal Assistance - Patient > 75%  ? ?Blood pressure (!) 141/76, pulse 77, temperature 98.4 ?F (36.9 ?C), temperature source Oral, resp. rate 18, weight 70.1 kg, SpO2 100 %. ? ?Medical Problem List and Plan: ?1. 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            -patient may shower ?            -ELOS/Goals: 14-20 days S/MinA ?          -Continue CIR therapies including PT, OT   ? ? ?2.  Antithrombotics: ?-DVT/anticoagulation:  Pharmaceutical: Lovenox ?            -antiplatelet therapy: DAPT X 3 weeks followed by ASA alone ?3. Pain Management: tylenol prn ? -added voltaren gel to RIGHT knee with some benefit. ?4. Mood: LCSW to follow for evaluation and support.  ?            -antipsychotic agents: N/a ?5. Neuropsych: This patient is capable of making decisions on her own behalf. ?6. Skin/Wound Care: routine pressure relief measures.  ?7. Fluids/Electrolytes/Nutrition: Monitor I/O.   ? ?  Latest Ref Rng & Units 01/07/2022  ?  5:51 AM 01/03/2022  ?  6:02 AM 01/01/2022  ?  4:13 AM  ?BMP  ?Glucose 70 - 99 mg/dL 161122   096130   045124    ?BUN 6 - 20 mg/dL 22   17   20     ?Creatinine 0.44 - 1.00 mg/dL 4.091.42   8.111.62   9.141.66    ?Sodium 135 - 145 mmol/L 140   141   140    ?Potassium 3.5 - 5.1 mmol/L 3.9   4.0   3.9    ?Chloride 98 - 111 mmol/L 112   116   117    ?CO2 22 - 32 mmol/L 24   20   20     ?Calcium 8.9 - 10.3 mg/dL 8.0   7.8   8.2    ?  ?8. HTN: Monitor BP TID--continue Avapro and metoprolol ?            ?Vitals:  ? 01/11/22 1953 01/12/22 0218  ?BP: (!) 115/91 (!) 141/76  ?Pulse: 76 77  ?Resp: 18 18  ?Temp: 98.5 ?F (36.9 ?  C) 98.4 ?F  (36.9 ?C)  ?SpO2: 100% 100%  ?5/6 controlled although diastolic pressure can be borderline ?9.T2DM: Hgb A1C- 7.1 trending down as compliant with diet but not meds.  ?            --monitor BS ac/hs and use SSI for elevated BS ?            --BS variable from 74 to 235.  ?--Per [facility]--no meds picked up since Oct/Rx for basiglar and ozempic not picked up yet.  ?--Will start low dose Amaryl due to CKD.   ?CBG (last 3)  ?Recent Labs  ?  01/11/22 ?1703 01/11/22 ?2122 01/12/22 ?0604  ?GLUCAP 175* 228* 138*  ? ?Still some lability, counseled patient on diet.  Diabetes coronary to follow-up as well. ?10. History of depression/anxiety: Continue Celexa. Buproprion added for smoking cessation  ?11. Acute on chronic renal failure: SCr 1.33 at admission-->1.66 ?            -5/1 Cr 1.42 ?12. Insomnia: Secondary to anxiety. Trazodone prn added. ?--monitor for now.  ?13. Overweight: BMI 25.72: provide dietary education ?  ?14.  Anemia no obvious blood loss, Negative stool  guaic  ? ?  Latest Ref Rng & Units 01/07/2022  ?  5:51 AM 01/03/2022  ?  6:02 AM 12/31/2021  ?  1:17 PM  ?CBC  ?WBC 4.0 - 10.5 K/uL 9.9   9.0   12.4    ?Hemoglobin 12.0 - 15.0 g/dL 9.6   9.6   36.6    ?Hematocrit 36.0 - 46.0 % 28.7   29.0   35.4    ?Platelets 150 - 400 K/uL 256   202   241    ?15.  Hx of tobacco abuse start bupropion 150mg  per day , plavix may boost effect , monitor for HA  ? ? ?LOS: ?10 days ?A FACE TO FACE EVALUATION WAS PERFORMED ? ? ?01/12/2022, 10:16 AM  ? ?  ?

## 2022-01-12 NOTE — Plan of Care (Signed)
?  Problem: Consults ?Goal: RH STROKE PATIENT EDUCATION ?Description: See Patient Education module for education specifics  ?Outcome: Progressing ?  ?Problem: RH SAFETY ?Goal: RH STG ADHERE TO SAFETY PRECAUTIONS W/ASSISTANCE/DEVICE ?Description: STG Adhere to Safety Precautions With cues Assistance/Device. ?Outcome: Progressing ?  ?Problem: RH KNOWLEDGE DEFICIT ?Goal: RH STG INCREASE KNOWLEDGE OF DIABETES ?Description: Patient and family will be able to manage DM with medications and dietary modifications using handouts and educational resources independently ?Outcome: Progressing ?Goal: RH STG INCREASE KNOWLEDGE OF HYPERTENSION ?Description: Patient and family will be able to manage HTN with medications and dietary modifications using handouts and educational resources independently ?Outcome: Progressing ?Goal: RH STG INCREASE KNOWLEGDE OF HYPERLIPIDEMIA ?Description: Patient and family will be able to manage HLD with medications and dietary modifications using handouts and educational resources independently ?Outcome: Progressing ?Goal: RH STG INCREASE KNOWLEDGE OF STROKE PROPHYLAXIS ?Description: Patient and family will be able to manage Secondary stroke with medications and dietary modifications using handouts and educational resources independently ?Outcome: Progressing ?  ?

## 2022-01-12 NOTE — Progress Notes (Signed)
Physical Therapy Session Note ? ?Patient Details  ?Name: Stacey Lucas ?MRN: [id number] ?Date of Birth: October 31, 1975 ? ?Today's Date: 01/12/2022 ?PT Individual Time: 7628-3151 ?PT Individual Time Calculation (min): 55 min  ? ?Short Term Goals: ?Week 1:  PT Short Term Goal 1 (Week 1): Pt will transfer to and from [facility] with min assist consistently ?PT Short Term Goal 1 - Progress (Week 1): Met ?PT Short Term Goal 2 (Week 1): Pt will Ambulate 33f with min assist and LRAD ?PT Short Term Goal 2 - Progress (Week 1): Met ?PT Short Term Goal 3 (Week 1): Pt will propell WC with supervision assist ?PT Short Term Goal 3 - Progress (Week 1): Met ?PT Short Term Goal 4 (Week 1): Pt will improve Berg balance scale by >7 points to demonstrate improved functional movement ?PT Short Term Goal 4 - Progress (Week 1): Met ?Week 2:  PT Short Term Goal 1 (Week 2): STG=LTG due to ELOS ?Week 3:    ?Week 4:    ? ?Skilled Therapeutic Interventions/Progress Updates:  ? ?Pt received sitting in WC and agreeable to PT ? ?Donning shoes and foot up brace. Sitting EOB with supervision assist progressing to mod assist from PT for improved placement and management of brace.  ? ?Gait training with rollator and foot up brace 2 x 1340fwith supervision assist with cues for posture and step length on the RLE. ? ?Dynamic balance training while enaged in gross motor task of wii fit bowling and boxing. Able to perform bowling standing on airex pad with intermittent 1-0 UE support with min asist for safety to prevent posterior LOOB with cues for hip strategy to correct . Boxing performed x 2 rounds of 3 in standing with min assist for balance to prevent posterior LOB from level surface. Improved anterior weight shifting noted throughout balance task.   ? ?Patient returned to room and left sitting  EOB with call bell in reach and all needs met.   ? ?   ? ?Therapy Documentation ?Precautions:  ?Precautions ?Precautions: Fall ?Precaution Comments: R charcot foot, R  HP ?Restrictions ?Weight Bearing Restrictions: No ? ?Pain: ?Pain Assessment ?Pain Scale: 0-10 ?Pain Score: 4  ? ? ?Therapy/Group: Individual Therapy ? ?AuLorie Phenix5/02/2022, 10:04 AM  ?

## 2022-01-13 DIAGNOSIS — E669 Obesity, unspecified: Secondary | ICD-10-CM | POA: Diagnosis not present

## 2022-01-13 DIAGNOSIS — I6300 Cerebral infarction due to thrombosis of unspecified precerebral artery: Secondary | ICD-10-CM | POA: Diagnosis not present

## 2022-01-13 DIAGNOSIS — E1169 Type 2 diabetes mellitus with other specified complication: Secondary | ICD-10-CM | POA: Diagnosis not present

## 2022-01-13 LAB — GLUCOSE, CAPILLARY
Glucose-Capillary: 71 mg/dL (ref 70–99)
Glucose-Capillary: 106 mg/dL — ABNORMAL HIGH (ref 70–99)
Glucose-Capillary: 156 mg/dL — ABNORMAL HIGH (ref 70–99)
Glucose-Capillary: 155 mg/dL — ABNORMAL HIGH (ref 70–99)

## 2022-01-13 NOTE — Progress Notes (Signed)
?                                                       PROGRESS NOTE ? ? ?Subjective/Complaints: ? ?Pt without complaints. Right knee not feeling too bad today. Happy to have day off from therapy today but ready to work Monday! ? ?ROS: Patient denies fever, rash, sore throat, blurred vision, dizziness, nausea, vomiting, diarrhea, cough, shortness of breath or chest pain,  back/neck pain, headache, or mood change.  ? ? ?Objective: ?  ?No results found. ? ?No results for input(s): WBC, HGB, HCT, PLT in the last 72 hours. ? ?No results for input(s): NA, K, CL, CO2, GLUCOSE, BUN, CREATININE, CALCIUM in the last 72 hours. ? ? ?Intake/Output Summary (Last 24 hours) at 01/13/2022 0921 ?Last data filed at 01/13/2022 9562 ?Gross per 24 hour  ?Intake 657 ml  ?Output --  ?Net 657 ml  ?  ? ?  ? ?Physical Exam: ?Vital Signs ?Blood pressure (!) 154/85, pulse 67, temperature 98.1 ?F (36.7 ?C), temperature source Oral, resp. rate 18, weight 70.1 kg, SpO2 100 %. ? ? ? ?Constitutional: No distress . Vital signs reviewed. ?HEENT: NCAT, EOMI, oral membranes moist ?Neck: supple ?Cardiovascular: RRR without murmur. No JVD    ?Respiratory/Chest: CTA Bilaterally without wheezes or rales. Normal effort    ?GI/Abdomen: BS +, non-tender, non-distended ?Ext: no clubbing, cyanosis, or edema ?Psych: pleasant and cooperative  ?Skin: No evidence of breakdown, no evidence of rash ?Neurologic: Dysarthria Cranial nerves II through XII intact, motor strength is 3/5 in Right and 4- Left  deltoid, bicep, tricep, grip, hip flexor, knee extensors, ankle dorsiflexor and plantar flexor ?Sensory exam normal for light touch and pain in all 4 limbs. No limb ataxia or cerebellar signs. No abnormal tone appreciated.   ?Musculoskeletal: Full range of motion in all 4 extremities. Right knee mild medial tenderness no swelling or effusion ? ? ?Assessment/Plan: ?1. Functional deficits which require 3+ hours per day of interdisciplinary therapy in a comprehensive  inpatient rehab setting. ?Physiatrist is providing close team supervision and 24 hour management of active medical problems listed below. ?Physiatrist and rehab team continue to assess barriers to discharge/monitor patient progress toward functional and medical goals ? ?Care Tool: ? ?Bathing ?   ?Body parts bathed by patient: Right arm, Left arm, Chest, Abdomen, Face, Left lower leg, Right lower leg, Right upper leg, Left upper leg, Buttocks, Front perineal area  ?   ?  ?  ?Bathing assist Assist Level: Contact Guard/Touching assist ?  ?  ?Upper Body Dressing/Undressing ?Upper body dressing   ?What is the patient wearing?: Pull over shirt ?   ?Upper body assist Assist Level: Set up assist ?   ?Lower Body Dressing/Undressing ?Lower body dressing ? ? ?   ?What is the patient wearing?: Pants, Underwear/pull up ? ?  ? ?Lower body assist Assist for lower body dressing: Contact Guard/Touching assist ?   ? ?Toileting ?Toileting Toileting Activity did not occur (Acupuncturist and hygiene only): N/A (no void or bm)  ?Toileting assist Assist for toileting: Contact Guard/Touching assist ?  ?  ?Transfers ?Chair/bed transfer ? ?Transfers assist ?   ? ?Chair/bed transfer assist level: Contact Guard/Touching assist ?  ?  ?Locomotion ?Ambulation ? ? ?Ambulation assist ? ?   ? ?Assist level: Moderate Assistance -  Patient 50 - 74% ?Assistive device: Walker-rolling ?Max distance: 15  ? ?Walk 10 feet activity ? ? ?Assist ?   ? ?Assist level: Moderate Assistance - Patient - 50 - 74% ?Assistive device: Walker-rolling  ? ?Walk 50 feet activity ? ? ?Assist Walk 50 feet with 2 turns activity did not occur: Safety/medical concerns ? ?  ?   ? ? ?Walk 150 feet activity ? ? ?Assist Walk 150 feet activity did not occur: Safety/medical concerns ? ?  ?  ?  ? ?Walk 10 feet on uneven surface  ?activity ? ? ?Assist Walk 10 feet on uneven surfaces activity did not occur: Safety/medical concerns ? ? ?  ?   ? ?Wheelchair ? ? ? ? ?Assist Is the  patient using a wheelchair?: Yes ?Type of Wheelchair: Manual ?  ? ?Wheelchair assist level: Minimal Assistance - Patient > 75% ?Max wheelchair distance: 150  ? ? ?Wheelchair 50 feet with 2 turns activity ? ? ? ?Assist ? ?  ?  ? ? ?Assist Level: Minimal Assistance - Patient > 75%  ? ?Wheelchair 150 feet activity  ? ? ? ?Assist ?   ? ? ?Assist Level: Minimal Assistance - Patient > 75%  ? ?Blood pressure (!) 154/85, pulse 67, temperature 98.1 ?F (36.7 ?C), temperature source Oral, resp. rate 18, weight 70.1 kg, SpO2 100 %. ? ?Medical Problem List and Plan: ?1. 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            -patient may shower ?            -ELOS/Goals: 14-20 days S/MinA ?         -Continue CIR therapies including PT, OT  ? ? ?2.  Antithrombotics: ?-DVT/anticoagulation:  Pharmaceutical: Lovenox ?            -antiplatelet therapy: DAPT X 3 weeks followed by ASA alone ?3. Pain Management: tylenol prn ? -added voltaren gel to RIGHT knee with some benefit. ?4. Mood: LCSW to follow for evaluation and support.  ?            -antipsychotic agents: N/a ?5. Neuropsych: This patient is capable of making decisions on her own behalf. ?6. Skin/Wound Care: routine pressure relief measures.  ?7. Fluids/Electrolytes/Nutrition: Monitor I/O.   ? ?  Latest Ref Rng & Units 01/07/2022  ?  5:51 AM 01/03/2022  ?  6:02 AM 01/01/2022  ?  4:13 AM  ?BMP  ?Glucose 70 - 99 mg/dL 161122   096130   045124    ?BUN 6 - 20 mg/dL 22   17   20     ?Creatinine 0.44 - 1.00 mg/dL 4.091.42   8.111.62   9.141.66    ?Sodium 135 - 145 mmol/L 140   141   140    ?Potassium 3.5 - 5.1 mmol/L 3.9   4.0   3.9    ?Chloride 98 - 111 mmol/L 112   116   117    ?CO2 22 - 32 mmol/L 24   20   20     ?Calcium 8.9 - 10.3 mg/dL 8.0   7.8   8.2    ?  ?8. HTN: Monitor BP TID--continue Avapro and metoprolol ?            ?Vitals:  ? 01/12/22 1922 01/13/22 0356  ?BP: (!) 141/66 (!) 154/85  ?Pulse: 70 67  ?Resp: 18 18  ?Temp: 98.6 ?F (37 ?C) 98.1 ?F  (36.7 ?C)  ?  SpO2: 100% 100%  ?5/7 controlled although diastolic pressure can be borderline ?9.T2DM: Hgb A1C- 7.1 trending down as compliant with diet but not meds.  ?            --monitor BS ac/hs and use SSI for elevated BS ?            --BS variable from 74 to 235.  ?--Per [facility]--no meds picked up since Oct/Rx for basiglar and ozempic not picked up yet.  ?-- low dose Amaryl due to CKD.   ?CBG (last 3)  ?Recent Labs  ?  01/12/22 ?1158 01/12/22 ?2110 01/13/22 ?3846  ?GLUCAP 96 146* 71  ?5/7 some improvement last 24 hr. Diet discussed ?10. History of depression/anxiety: Continue Celexa. Buproprion added for smoking cessation  ?11. Acute on chronic renal failure: SCr 1.33 at admission-->1.66 ?            -5/1 Cr 1.42 ?12. Insomnia: Secondary to anxiety. Trazodone prn added. ?--monitor for now.  ?13. Overweight: BMI 25.72: provide dietary education ?  ?14.  Anemia no obvious blood loss, Negative stool  guaic  ? ?  Latest Ref Rng & Units 01/07/2022  ?  5:51 AM 01/03/2022  ?  6:02 AM 12/31/2021  ?  1:17 PM  ?CBC  ?WBC 4.0 - 10.5 K/uL 9.9   9.0   12.4    ?Hemoglobin 12.0 - 15.0 g/dL 9.6   9.6   65.9    ?Hematocrit 36.0 - 46.0 % 28.7   29.0   35.4    ?Platelets 150 - 400 K/uL 256   202   241    ?15.  Hx of tobacco abuse start bupropion 150mg  per day , plavix may boost effect , monitor for HA  ? ? ?LOS: ?11 days ?A FACE TO FACE EVALUATION WAS PERFORMED ? ? ?01/13/2022, 9:21 AM  ? ?  ?

## 2022-01-13 NOTE — Plan of Care (Signed)
?  Problem: Consults ?Goal: RH STROKE PATIENT EDUCATION ?Description: See Patient Education module for education specifics  ?Outcome: Progressing ?  ?Problem: RH SAFETY ?Goal: RH STG ADHERE TO SAFETY PRECAUTIONS W/ASSISTANCE/DEVICE ?Description: STG Adhere to Safety Precautions With cues Assistance/Device. ?Outcome: Progressing ?  ?Problem: RH KNOWLEDGE DEFICIT ?Goal: RH STG INCREASE KNOWLEDGE OF DIABETES ?Description: Patient and family will be able to manage DM with medications and dietary modifications using handouts and educational resources independently ?Outcome: Progressing ?Goal: RH STG INCREASE KNOWLEDGE OF HYPERTENSION ?Description: Patient and family will be able to manage HTN with medications and dietary modifications using handouts and educational resources independently ?Outcome: Progressing ?Goal: RH STG INCREASE KNOWLEGDE OF HYPERLIPIDEMIA ?Description: Patient and family will be able to manage HLD with medications and dietary modifications using handouts and educational resources independently ?Outcome: Progressing ?Goal: RH STG INCREASE KNOWLEDGE OF STROKE PROPHYLAXIS ?Description: Patient and family will be able to manage Secondary stroke with medications and dietary modifications using handouts and educational resources independently ?Outcome: Progressing ?  ?

## 2022-01-13 NOTE — Progress Notes (Signed)
Physical Therapy Session Note ? ?Patient Details  ?Name: Stacey Lucas ?MRN: [id number] ?Date of Birth: 02-18-1976 ? ?Today's Date: 01/13/2022 ?PT Individual Time: 1315-1400 ?PT Individual Time Calculation (min): 45 min  ? ?Short Term Goals: ?Week 1:  PT Short Term Goal 1 (Week 1): Pt will transfer to and from [facility] with min assist consistently ?PT Short Term Goal 1 - Progress (Week 1): Met ?PT Short Term Goal 2 (Week 1): Pt will Ambulate 64f with min assist and LRAD ?PT Short Term Goal 2 - Progress (Week 1): Met ?PT Short Term Goal 3 (Week 1): Pt will propell WC with supervision assist ?PT Short Term Goal 3 - Progress (Week 1): Met ?PT Short Term Goal 4 (Week 1): Pt will improve Berg balance scale by >7 points to demonstrate improved functional movement ?PT Short Term Goal 4 - Progress (Week 1): Met ?Week 2:  PT Short Term Goal 1 (Week 2): STG=LTG due to ELOS ? ?Skilled Therapeutic Interventions/Progress Updates:  ?  Pt eager and agreeable to therapy session. Mod I for bed mobility using bedrails for support and ease. Donned pants EOB with supervision and attempted to don foot up brace independently as well but utlimately required assistance to get fastened into place. Sit > stand with rollator overall CGA throughout session but does require cues for locking of brakes appropriately. Functional gait training on unit > 150' x 3 different bouts with cues for upright posture, decreased reliance on UE's (relax shoulders) and noted slight trendelenberg gait pattern. On last bout of gait did have episode of L knee buckling requiring mod assist to correct.  ? ?NMR retraining on [employer] using Limits of Stability, Random Control, and Catch Game programs varying levels of difficulty based on each program and also maintaining compliant surface on level 8. Requires cues for weightshifting technique as tendency to lead with head to make adjustments vs through hips/ankles. Min assist for step up/down onto platform. Excellent engagement  in activity. ? ?Continued NMR for stair negotiation training with focus on eccentric control and control at knee (hyperextension noted at times) with bilateral rails and overall CGA to min assist x 4 steps (6"). ? ?End of session transitioned back to bed per request with CGA overall and repositioned in bed mod I. All needs in reach.  ? ?Therapy Documentation ?Precautions:  ?Precautions ?Precautions: Fall ?Precaution Comments: R charcot foot, R HP ?Restrictions ?Weight Bearing Restrictions: No ? ? ?Pain: ?Denies pain.  ? ? ? ?Therapy/Group: Individual Therapy ? ?GAllayne Gitelman?ALars Masson PT, [name], CBIS ? ?01/13/2022, 2:04 PM  ?

## 2022-01-14 DIAGNOSIS — I6300 Cerebral infarction due to thrombosis of unspecified precerebral artery: Secondary | ICD-10-CM | POA: Diagnosis not present

## 2022-01-14 LAB — CBC
HCT: 28.8 % — ABNORMAL LOW (ref 36.0–46.0)
Hemoglobin: 9.5 g/dL — ABNORMAL LOW (ref 12.0–15.0)
MCH: 25.8 pg — ABNORMAL LOW (ref 26.0–34.0)
MCHC: 33 g/dL (ref 30.0–36.0)
MCV: 78.3 fL — ABNORMAL LOW (ref 80.0–100.0)
Platelets: 259 10*3/uL (ref 150–400)
RBC: 3.68 MIL/uL — ABNORMAL LOW (ref 3.87–5.11)
RDW: 15.3 % (ref 11.5–15.5)
WBC: 7.9 10*3/uL (ref 4.0–10.5)
nRBC: 0 % (ref 0.0–0.2)

## 2022-01-14 LAB — BASIC METABOLIC PANEL
Anion gap: 7 (ref 5–15)
BUN: 23 mg/dL — ABNORMAL HIGH (ref 6–20)
CO2: 25 mmol/L (ref 22–32)
Calcium: 8.3 mg/dL — ABNORMAL LOW (ref 8.9–10.3)
Chloride: 110 mmol/L (ref 98–111)
Creatinine, Ser: 1.75 mg/dL — ABNORMAL HIGH (ref 0.44–1.00)
GFR, Estimated: 36 mL/min — ABNORMAL LOW (ref >60)
Glucose, Bld: 128 mg/dL — ABNORMAL HIGH (ref 70–99)
Potassium: 4 mmol/L (ref 3.5–5.1)
Sodium: 142 mmol/L (ref 135–145)

## 2022-01-14 LAB — GLUCOSE, CAPILLARY
Glucose-Capillary: 137 mg/dL — ABNORMAL HIGH (ref 70–99)
Glucose-Capillary: 114 mg/dL — ABNORMAL HIGH (ref 70–99)
Glucose-Capillary: 130 mg/dL — ABNORMAL HIGH (ref 70–99)
Glucose-Capillary: 141 mg/dL — ABNORMAL HIGH (ref 70–99)
Glucose-Capillary: 278 mg/dL — ABNORMAL HIGH (ref 70–99)

## 2022-01-14 MED ORDER — GABAPENTIN 400 MG PO CAPS
400.0000 mg | ORAL_CAPSULE | Freq: Two times a day (BID) | ORAL | Status: DC
  Administered 2022-01-14 – 2022-01-16 (×5): 400 mg via ORAL
  Filled 2022-01-14 (×5): qty 1

## 2022-01-14 MED ORDER — AMLODIPINE BESYLATE 10 MG PO TABS
10.0000 mg | ORAL_TABLET | Freq: Every day | ORAL | Status: DC
Start: 2022-01-15 — End: 2022-01-17
  Administered 2022-01-15 – 2022-01-17 (×3): 10 mg via ORAL
  Filled 2022-01-14 (×3): qty 1

## 2022-01-14 MED ORDER — IRBESARTAN 75 MG PO TABS
75.0000 mg | ORAL_TABLET | Freq: Every day | ORAL | Status: DC
Start: 2022-01-15 — End: 2022-01-17
  Administered 2022-01-15 – 2022-01-17 (×3): 75 mg via ORAL
  Filled 2022-01-14 (×3): qty 1

## 2022-01-14 MED ORDER — SODIUM CHLORIDE 0.45 % IV SOLN: INTRAVENOUS | Status: DC

## 2022-01-14 NOTE — Plan of Care (Signed)
?  Problem: Consults ?Goal: RH STROKE PATIENT EDUCATION ?Description: See Patient Education module for education specifics  ?Outcome: Progressing ?  ?Problem: RH SAFETY ?Goal: RH STG ADHERE TO SAFETY PRECAUTIONS W/ASSISTANCE/DEVICE ?Description: STG Adhere to Safety Precautions With cues Assistance/Device. ?Outcome: Progressing ?  ?Problem: RH KNOWLEDGE DEFICIT ?Goal: RH STG INCREASE KNOWLEDGE OF DIABETES ?Description: Patient and family will be able to manage DM with medications and dietary modifications using handouts and educational resources independently ?Outcome: Progressing ?Goal: RH STG INCREASE KNOWLEDGE OF HYPERTENSION ?Description: Patient and family will be able to manage HTN with medications and dietary modifications using handouts and educational resources independently ?Outcome: Progressing ?Goal: RH STG INCREASE KNOWLEGDE OF HYPERLIPIDEMIA ?Description: Patient and family will be able to manage HLD with medications and dietary modifications using handouts and educational resources independently ?Outcome: Progressing ?Goal: RH STG INCREASE KNOWLEDGE OF STROKE PROPHYLAXIS ?Description: Patient and family will be able to manage Secondary stroke with medications and dietary modifications using handouts and educational resources independently ?Outcome: Progressing ?  ?

## 2022-01-14 NOTE — Progress Notes (Signed)
Occupational Therapy Session Note ? ?Patient Details  ?Name: Atlanta Pelto ?MRN: [id number] ?Date of Birth: 12-Jun-1976 ? ?Today's Date: 01/14/2022 ?Session 1 ?OT Individual Time: [phone] ?OT Individual Time Calculation (min): 56 min  ? ?Sessin 2 ?OT Individual Time: 5364-6803 ?OT Individual Time Calculation (min): 76 min  ? ?Short Term Goals: ?Week 2:  OT Short Term Goal 1 (Week 2): STG = LTG 2/2 ELOS ? ?Skilled Therapeutic Interventions/Progress Updates:  ?  Session 1 ?Pt greeted semi-reclined in bed and agreeable to OT treatment session. Pt wanted to shower this morning. Pt requested to use rollator in room. Pt stated she has a rollator at home, but hers is larger and she can't use it in her house, only uses it outside. Pt recalled to lock breaks prior to standing, then ambulated into bathroom with close supervision. Pt sat on commode and voided bladder, then completed peri-care with supervision. Pt needed cues for safety to lock breaks this  time prior to standing with CGA. Pt transferred to shower and completed bathing tasks from shower seat with overall close supervision. Dressing tasks from wc with increased time and overall improved coordination to don socks, pants, and underwear. CGA for balance when stanidng to pull up pants. Addressed fine motor coordination with opening toothpaste and grasping toothbrush. Focus on smoothness with brushing teeth. Pt completed stand-pivot back to bed with close supervision and left semi-reclined in bed with with call bell in reach, alarm on, and needs met.  ? Pain: ? Denies pain ? ?Session 2 ?Pt greeted semi-reclined in bed and agreeable to OT treatment session. Pt ambulated to laundry room with RW and CGA. Worked on laundry task reaching into top load washer and place in front load dryer. Pt needed CGA for balance and intermittent min A for slight LOB when reaching forward. Pt then ambulated to therapy room and addressed standing balance/endurance and B UE coordination with  standing towel folding task. Had pt reaching to the R to collect towels. Pt with 3 LOB's to L and R requring min A to correct. R fine motor activities with graded beading task, then progressed to theraputty activity and locating small beads from putty. Pt then returned to laundry room and pulled out clothes from front loader. Addressed balance by standing and folding clothing. Pt ambulated back to room and left semi-reclined in bed with bed alarm on and needs met.  ? ?Therapy Documentation ?Precautions:  ?Precautions ?Precautions: Fall ?Precaution Comments: R charcot foot, R HP ?Restrictions ?Weight Bearing Restrictions: No ?General: ? Pain: ? Denies pain ? ? ?Therapy/Group: Individual Therapy ? ?Daneen Schick Michael Walrath ?01/14/2022, 3:21 PM ?

## 2022-01-14 NOTE — Progress Notes (Signed)
?                                                       PROGRESS NOTE ? ? ?Subjective/Complaints: ? ?No issues overnite except low CBG , requests hs snack ? ?ROS: Patient denies CP, SOB, N/V/D ? ? ?Objective: ?  ?No results found. ? ?Recent Labs  ?  01/14/22 ?0626  ?WBC 7.9  ?HGB 9.5*  ?HCT 28.8*  ?PLT 259  ? ? ?Recent Labs  ?  01/14/22 ?0626  ?NA 142  ?K 4.0  ?CL 110  ?CO2 25  ?GLUCOSE 128*  ?BUN 23*  ?CREATININE 1.75*  ?CALCIUM 8.3*  ? ? ? ?Intake/Output Summary (Last 24 hours) at 01/14/2022 0833 ?Last data filed at 01/14/2022 0709 ?Gross per 24 hour  ?Intake 476 ml  ?Output --  ?Net 476 ml  ? ?  ? ?  ? ?Physical Exam: ?Vital Signs ?Blood pressure 133/70, pulse 70, temperature 98.4 ?F (36.9 ?C), temperature source Oral, resp. rate 18, weight 70.1 kg, SpO2 100 %. ? ? ?General: No acute distress ?Mood and affect are appropriate ?Heart: Regular rate and rhythm no rubs murmurs or extra sounds ?Lungs: Clear to auscultation, breathing unlabored, no rales or wheezes ?Abdomen: Positive bowel sounds, soft nontender to palpation, nondistended ?Extremities: No clubbing, cyanosis, or edema ? ?Skin: No evidence of breakdown, no evidence of rash ?Neurologic: Dysarthria Cranial nerves II through XII intact, motor strength is 3/5 in Right and 4- Left  deltoid, bicep, tricep, grip, hip flexor, knee extensors, ankle dorsiflexor and plantar flexor ?Sensory exam normal for light touch and pain in all 4 limbs. No limb ataxia or cerebellar signs. No abnormal tone appreciated.   ?Musculoskeletal: Full range of motion in all 4 extremities. Right knee mild medial tenderness no swelling or effusion ? ? ?Assessment/Plan: ?1. Functional deficits which require 3+ hours per day of interdisciplinary therapy in a comprehensive inpatient rehab setting. ?Physiatrist is providing close team supervision and 24 hour management of active medical problems listed below. ?Physiatrist and rehab team continue to assess barriers to discharge/monitor patient  progress toward functional and medical goals ? ?Care Tool: ? ?Bathing ?   ?Body parts bathed by patient: Right arm, Left arm, Chest, Abdomen, Face, Left lower leg, Right lower leg, Right upper leg, Left upper leg, Buttocks, Front perineal area  ?   ?  ?  ?Bathing assist Assist Level: Contact Guard/Touching assist ?  ?  ?Upper Body Dressing/Undressing ?Upper body dressing   ?What is the patient wearing?: Pull over shirt ?   ?Upper body assist Assist Level: Set up assist ?   ?Lower Body Dressing/Undressing ?Lower body dressing ? ? ?   ?What is the patient wearing?: Pants, Underwear/pull up ? ?  ? ?Lower body assist Assist for lower body dressing: Contact Guard/Touching assist ?   ? ?Toileting ?Toileting Toileting Activity did not occur (AcupuncturistClothing management and hygiene only): N/A (no void or bm)  ?Toileting assist Assist for toileting: Contact Guard/Touching assist ?  ?  ?Transfers ?Chair/bed transfer ? ?Transfers assist ?   ? ?Chair/bed transfer assist level: Contact Guard/Touching assist ?  ?  ?Locomotion ?Ambulation ? ? ?Ambulation assist ? ?   ? ?Assist level: Contact Guard/Touching assist ?Assistive device:  (rollator and foot up brace L) ?Max distance: 150'  ? ?Walk 10 feet activity ? ? ?  Assist ?   ? ?Assist level: Supervision/Verbal cueing ?Assistive device: Orthosis, Rollator  ? ?Walk 50 feet activity ? ? ?Assist Walk 50 feet with 2 turns activity did not occur: Safety/medical concerns ? ?Assist level: Contact Guard/Touching assist ?Assistive device: Orthosis, Rollator  ? ? ?Walk 150 feet activity ? ? ?Assist Walk 150 feet activity did not occur: Safety/medical concerns ? ?Assist level: Contact Guard/Touching assist ?Assistive device: Rollator, Orthosis ?  ? ?Walk 10 feet on uneven surface  ?activity ? ? ?Assist Walk 10 feet on uneven surfaces activity did not occur: Safety/medical concerns ? ? ?  ?   ? ?Wheelchair ? ? ? ? ?Assist Is the patient using a wheelchair?: Yes ?Type of Wheelchair: Manual ?   ? ?Wheelchair assist level: Minimal Assistance - Patient > 75% ?Max wheelchair distance: 150  ? ? ?Wheelchair 50 feet with 2 turns activity ? ? ? ?Assist ? ?  ?  ? ? ?Assist Level: Minimal Assistance - Patient > 75%  ? ?Wheelchair 150 feet activity  ? ? ? ?Assist ?   ? ? ?Assist Level: Minimal Assistance - Patient > 75%  ? ?Blood pressure 133/70, pulse 70, temperature 98.4 ?F (36.9 ?C), temperature source Oral, resp. rate 18, weight 70.1 kg, SpO2 100 %. ? ?Medical Problem List and Plan: ?1. 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            -patient may shower ?            -ELOS/Goals: 5/11 d/c date- discussed smoking and cocaine cessation given they are major risk factors for recurrent CVA ?         -Continue CIR therapies including PT, OT  ? ? ?2.  Antithrombotics: ?-DVT/anticoagulation:  Pharmaceutical: Lovenox ?            -antiplatelet therapy: DAPT X 3 weeks followed by ASA alone on 5/13 ?3. Pain Management: tylenol prn ? -added voltaren gel to RIGHT knee with some benefit. ?4. Mood: LCSW to follow for evaluation and support.  ?            -antipsychotic agents: N/a ?5. Neuropsych: This patient is capable of making decisions on her own behalf. ?6. Skin/Wound Care: routine pressure relief measures.  ?7. Fluids/Electrolytes/Nutrition: Monitor I/O.  Poor intake of fluid add IVF at noc, repeat 5/10 ? ?  Latest Ref Rng & Units 01/14/2022  ?  6:26 AM 01/07/2022  ?  5:51 AM 01/03/2022  ?  6:02 AM  ?BMP  ?Glucose 70 - 99 mg/dL 657   846   962    ?BUN 6 - 20 mg/dL 23   22   17     ?Creatinine 0.44 - 1.00 mg/dL   9.52   8.41    ?Sodium 135 - 145 mmol/L 142   140   141    ?Potassium 3.5 - 5.1 mmol/L 4.0   3.9   4.0    ?Chloride 98 - 111 mmol/L 110   112   116    ?CO2 22 - 32 mmol/L 25   24   20     ?Calcium 8.9 - 10.3 mg/dL 8.3   8.0   7.8    ?  ?8. HTN: Monitor BP TID--continue Avapro and metoprolol ?            ?Vitals:  ? 01/13/22 1929 01/14/22 0341  ?BP: 139/82  133/70  ?Pulse: 77 70  ?Resp: 18 18  ?Temp:  98.9 ?F (37.2 ?C) 98.4 ?F (36.9 ?C)  ?SpO2: 100% 100%  ?5/8 controlled  but Creat elevated will increase amlodipine to 10mg  reduce avapro to 75mg  and give IVF overnite ?9.T2DM: Hgb A1C- 7.1 trending down as compliant with diet but not meds.  ?            --monitor BS ac/hs and use SSI for elevated BS ?            --BS variable from 74 to 235.  ?--Per [facility]--no meds picked up since Oct/Rx for basiglar and ozempic not picked up yet.  ?-- low dose Amaryl due to CKD.   ?CBG (last 3)  ?Recent Labs  ?  01/13/22 ?1706 01/13/22 ?1944 01/14/22 ?03/15/22  ?GLUCAP 156* 155* 114*  ? ?5/8 controlled  ?10. History of depression/anxiety: Continue Celexa. Buproprion added for smoking cessation  ?11. Acute on chronic renal failure: SCr 1.33 at admission-->1.66 ?            -5/1 Cr 1.42 ?12. Insomnia: Secondary to anxiety. Trazodone prn added. ?--monitor for now.  ?13. Overweight: BMI 25.72: provide dietary education ?  ?14.  Anemia no obvious blood loss, Negative stool  guaic  ? ?  Latest Ref Rng & Units 01/14/2022  ?  6:26 AM 01/07/2022  ?  5:51 AM 01/03/2022  ?  6:02 AM  ?CBC  ?WBC 4.0 - 10.5 K/uL 7.9   9.9   9.0    ?Hemoglobin 12.0 - 15.0 g/dL 9.5   9.6   9.6    ?Hematocrit 36.0 - 46.0 % 28.8   28.7   29.0    ?Platelets 150 - 400 K/uL 259   256   202    ?15.  Hx of tobacco abuse start bupropion 150mg  per day , plavix may boost effect , monitor for HA  ?Start Gabapentin 400mg  BID for cocaine cessation  ? ?LOS: ?12 days ?A FACE TO FACE EVALUATION WAS PERFORMED ? ?03/09/2022 Hatcher Froning ?01/14/2022, 8:33 AM  ? ?  ?

## 2022-01-14 NOTE — Progress Notes (Signed)
Physical Therapy Session Note ? ?Patient Details  ?Name: Stacey Lucas ?MRN: [id number] ?Date of Birth: August 29, 1976 ? ?Today's Date: 01/14/2022 ?PT Individual Time: 7371-0626 ?PT Individual Time Calculation (min): 46 min  ? ?Short Term Goals: ?Week 1:  PT Short Term Goal 1 (Week 1): Pt will transfer to and from [facility] with min assist consistently ?PT Short Term Goal 1 - Progress (Week 1): Met ?PT Short Term Goal 2 (Week 1): Pt will Ambulate 70f with min assist and LRAD ?PT Short Term Goal 2 - Progress (Week 1): Met ?PT Short Term Goal 3 (Week 1): Pt will propell WC with supervision assist ?PT Short Term Goal 3 - Progress (Week 1): Met ?PT Short Term Goal 4 (Week 1): Pt will improve Berg balance scale by >7 points to demonstrate improved functional movement ?PT Short Term Goal 4 - Progress (Week 1): Met ?Week 2:  PT Short Term Goal 1 (Week 2): STG=LTG due to ELOS ? ? ?Skilled Therapeutic Interventions/Progress Updates:  ?Patient supine in bed on entrance to room. Patient alert and agreeable to PT session.  ? ?Patient with no pain complaint throughout session. ? ?Therapeutic Activity: ?Bed Mobility: Patient performed supine <> sit with Mod I. No cueing required for technique. Toe up brace and socks donned MaxA for time. ?Transfers: Pt performed sit<>stand and stand pivot transfers throughout session with supervision/ intermittent CGA. Provided verbal cues for improved technique with forward lean and push with UE. Toilet transfer with CGA. Pt requires extra time but manages clothing and pericare with supervision. CGA for balance. Continued training for completion of pivot stepping and rollator placement in front of pt with safe brake application prior to change in position.  ? ?Gait Training:  ?Patient ambulated >200' x2 using rollator with CGA. Demonstrated hip flexion throughout. Provided vc/ tc for proximity to rollator, upright posture, posterior pelvic tilt. Pt ambulated 95' x2 with no AD and minA for balance d/t several  LOB to R >L. Delayed stepping strategy to maintain standing balance. ? ?Neuromuscular Re-ed: ?NMR facilitated during session with focus on sitting balance, posture, muscle fiber activation. Pt guided in seated pelvis ant/ post tilt in order to facilitate proprioception of upright posture and provide gentle stretching/ retraining to hip flexors. Progressed to standing pelvic tilts for improved proprioception of posture.  ? ?Blocked practice of sit<>stands for improved standing balance and completes with controlled descent ~75% of lowering. NMR performed for improvements in motor control and coordination, balance, sequencing, judgement, and self confidence/ efficacy in performing all aspects of mobility at highest level of independence.  ? ?Patient supine  in  bed at end of session with brakes locked, bed alarm set, and all needs within reach. ? ? ?Therapy Documentation ?Precautions:  ?Precautions ?Precautions: Fall ?Precaution Comments: R charcot foot, R HP ?Restrictions ?Weight Bearing Restrictions: No ?General: ?  ?Vital Signs: ?  ?Pain: ?Pain Assessment ?Pain Scale: 0-10 ?Pain Score: 0-No pain ? ?Therapy/Group: Individual Therapy ? ?JAlger SimonsPT, [name] ?01/14/2022, 1:03 PM  ?

## 2022-01-15 LAB — GLUCOSE, CAPILLARY
Glucose-Capillary: 175 mg/dL — ABNORMAL HIGH (ref 70–99)
Glucose-Capillary: 100 mg/dL — ABNORMAL HIGH (ref 70–99)
Glucose-Capillary: 167 mg/dL — ABNORMAL HIGH (ref 70–99)
Glucose-Capillary: 287 mg/dL — ABNORMAL HIGH (ref 70–99)

## 2022-01-15 NOTE — Progress Notes (Signed)
Occupational Therapy Session Note ? ?Patient Details  ?Name: Stacey Lucas ?MRN: [id number] ?Date of Birth: 08/09/1976 ? ?Today's Date: 01/15/2022 ?Session 1 ?OT Individual Time: 5681-2751 ?OT Individual Time Calculation (min): 71 min  ? ?Session 2 ?OT Individual Time: 7001-7494 ?OT Individual Time Calculation (min): 77 min  ? ? ?Short Term Goals: ?Week 2:  OT Short Term Goal 1 (Week 2): STG = LTG 2/2 ELOS ? ?Skilled Therapeutic Interventions/Progress Updates:  ?  Pt greeted semi-reclined in bed with significant other present. Per discussion with MD and patient, pt experienced R knee buckling this morning and difficulty standing. She was unable to walk to the bathroom safely and had to use the wc. Pt stood at EOB with RW, increased time to power up, but supervision. OT then had her shift weight side to side and perform standing marches with no knee buckling. OT felt pt safe to ambulate to the bathroom. Ambulation with CGA for safety to ensure OT was ready in case of knee buckling, but she did not have any knee buckle. Pt sat on toilet to doff clothes with close supervision. Then ambulated to the shower in similar fashion. Bathing completed with CGA when standing to wash buttocks for safety. Pt then ambulated back out of shower with CGA, RW, and no knee buckling. Pt sat EOB to don clothing and reported some mild pain in R knee when in figure 4 position. Pt also stated she has had a history of R knee buckling and used to get shots in it for arthritis. Pt needed min A to don L foot-up brace. Standing balance.endurance with standing grooming task. Pt needed CGA for balance and cues to maintain hip extension in standing at the sink. Pt returned to bed and left semi-reclined with bed alarm on, call bell in reach, and needs met.  ?Mild pain in R knee when in figure 4 position. Repositioned for pain management.  ? ?Session 2 ?Pt greeted semi-reclined in bed asleep, but easy to wake and agreeable to OT treatment session. Pt  reported feeling better this afternoon. Pt requested to go outside. Pt felt she could walk all the way. First had patient walk with rollator, but needed CGA/min A with 2 big LOBs requiring min/mod A to correct. Pt with difficulty maintaining upright posture with rollator pushing out too far in front. Felt rollator was unsafe to attempt walking outside, so OT traded it out for her RW. Pt in much more control with RW and ambulated with mostly supervision and occasional CGA. Pt needed cues for proximity to RW and positioning, as well as often kicking RW with R leg causing her tot stumble. Pt needed 3 seated rest breaks to make it outside. Once outside, worked on R hand fine motor control with small building block puzzle cubes. Difficulty grasping some saller parts.Focus on pinch and in-hand manipulation of small parts. Pt ambulated back up to the room in similar fashion with 2 seated rest breaks and a few standing. Cues for hip and trunk extension while ambulating. Pt left seated EOB with bed alarm on, call bell in reach, and needs met. ? ?Therapy Documentation ?Precautions:  ?Precautions ?Precautions: Fall ?Precaution Comments: R charcot foot, R HP ?Restrictions ?Weight Bearing Restrictions: No ?Pain: ? Denies pain ? ? ? ?Therapy/Group: Individual Therapy ? ?[name] ?01/15/2022, 3:23 PM ?

## 2022-01-15 NOTE — Progress Notes (Signed)
Patient did better with PT--no knee instability and reported some knee pain. Per PT, she was able to walk, was a little slower but felt to be due to OA.  ?

## 2022-01-15 NOTE — Progress Notes (Signed)
Patient started on Gabapentin to assist with cocaine cessation. Did get IVF for hydration last night--note that patient did not get Norvasc or Avapro yesterday but BP better this am. She does report that her fiancee reminded her that she had balance issues with use of gabapentin in the past. PT to update after am session. Will follow up latera.  ?

## 2022-01-15 NOTE — Progress Notes (Addendum)
?                                                       PROGRESS NOTE ? ? ?Subjective/Complaints: ? ?Patient states she had an episode of right knee buckling this morning.  Denies any numbness or tingling no pain in the right knee.  This was discussed with OT which is upcoming. ?Fianc? at bedside, discussed tobacco and cocaine cessation as well as being on bupropion and gabapentin to help assist with this ? ?ROS: Patient denies CP, SOB, N/V/D ? ? ?Objective: ?  ?No results found. ? ?Recent Labs  ?  01/14/22 ?0626  ?WBC 7.9  ?HGB 9.5*  ?HCT 28.8*  ?PLT 259  ? ? ? ?Recent Labs  ?  01/14/22 ?0626  ?NA 142  ?K 4.0  ?CL 110  ?CO2 25  ?GLUCOSE 128*  ?BUN 23*  ?CREATININE 1.75*  ?CALCIUM 8.3*  ? ? ? ? ?Intake/Output Summary (Last 24 hours) at 01/15/2022 0852 ?Last data filed at 01/15/2022 0408 ?Gross per 24 hour  ?Intake 798.33 ml  ?Output --  ?Net 798.33 ml  ? ?  ? ?  ? ?Physical Exam: ?Vital Signs ?Blood pressure 132/75, pulse 72, temperature 98.1 ?F (36.7 ?C), resp. rate 20, weight 70.1 kg, SpO2 100 %. ? ? ?General: No acute distress ?Mood and affect are appropriate ?Heart: Regular rate and rhythm no rubs murmurs or extra sounds ?Lungs: Clear to auscultation, breathing unlabored, no rales or wheezes ?Abdomen: Positive bowel sounds, soft nontender to palpation, nondistended ?Extremities: No clubbing, cyanosis, or edema ? ?Skin: No evidence of breakdown, no evidence of rash ?Neurologic: Dysarthria Cranial nerves II through XII intact, motor strength is 3/5 in Right and 4- Left  deltoid, bicep, tricep, grip, hip flexor, knee extensors, ankle dorsiflexor and plantar flexor ?Sensory exam normal for light touch and pain in all 4 limbs. No limb ataxia or cerebellar signs. No abnormal tone appreciated.   ?Musculoskeletal: Full range of motion in all 4 extremities. Right knee mild medial tenderness no swelling or effusion ? ? ?Assessment/Plan: ?1. Functional deficits which require 3+ hours per day of interdisciplinary therapy in a  comprehensive inpatient rehab setting. ?Physiatrist is providing close team supervision and 24 hour management of active medical problems listed below. ?Physiatrist and rehab team continue to assess barriers to discharge/monitor patient progress toward functional and medical goals ? ?Care Tool: ? ?Bathing ?   ?Body parts bathed by patient: Right arm, Left arm, Chest, Abdomen, Face, Left lower leg, Right lower leg, Right upper leg, Left upper leg, Buttocks, Front perineal area  ?   ?  ?  ?Bathing assist Assist Level: Contact Guard/Touching assist ?  ?  ?Upper Body Dressing/Undressing ?Upper body dressing   ?What is the patient wearing?: Pull over shirt ?   ?Upper body assist Assist Level: Set up assist ?   ?Lower Body Dressing/Undressing ?Lower body dressing ? ? ?   ?What is the patient wearing?: Pants, Underwear/pull up ? ?  ? ?Lower body assist Assist for lower body dressing: Contact Guard/Touching assist ?   ? ?Toileting ?Toileting Toileting Activity did not occur (Acupuncturist and hygiene only): N/A (no void or bm)  ?Toileting assist Assist for toileting: Contact Guard/Touching assist ?  ?  ?Transfers ?Chair/bed transfer ? ?Transfers assist ?   ? ?Chair/bed transfer  assist level: Contact Guard/Touching assist ?  ?  ?Locomotion ?Ambulation ? ? ?Ambulation assist ? ?   ? ?Assist level: Contact Guard/Touching assist ?Assistive device:  (rollator and foot up brace L) ?Max distance: 150'  ? ?Walk 10 feet activity ? ? ?Assist ?   ? ?Assist level: Supervision/Verbal cueing ?Assistive device: Orthosis, Rollator  ? ?Walk 50 feet activity ? ? ?Assist Walk 50 feet with 2 turns activity did not occur: Safety/medical concerns ? ?Assist level: Contact Guard/Touching assist ?Assistive device: Orthosis, Rollator  ? ? ?Walk 150 feet activity ? ? ?Assist Walk 150 feet activity did not occur: Safety/medical concerns ? ?Assist level: Contact Guard/Touching assist ?Assistive device: Rollator, Orthosis ?  ? ?Walk 10 feet on  uneven surface  ?activity ? ? ?Assist Walk 10 feet on uneven surfaces activity did not occur: Safety/medical concerns ? ? ?  ?   ? ?Wheelchair ? ? ? ? ?Assist Is the patient using a wheelchair?: Yes ?Type of Wheelchair: Manual ?  ? ?Wheelchair assist level: Minimal Assistance - Patient > 75% ?Max wheelchair distance: 150  ? ? ?Wheelchair 50 feet with 2 turns activity ? ? ? ?Assist ? ?  ?  ? ? ?Assist Level: Minimal Assistance - Patient > 75%  ? ?Wheelchair 150 feet activity  ? ? ? ?Assist ?   ? ? ?Assist Level: Minimal Assistance - Patient > 75%  ? ?Blood pressure 132/75, pulse 72, temperature 98.1 ?F (36.7 ?C), resp. rate 20, weight 70.1 kg, SpO2 100 %. ? ?Medical Problem List and Plan: ?1. 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            -patient may shower ?            -ELOS/Goals: 5/11 d/c date- discussed smoking and cocaine cessation given they are major risk factors for recurrent CVA now on bupropion and gabapentin ?         -Continue CIR therapies including PT, OT  ? ? ?2.  Antithrombotics: ?-DVT/anticoagulation:  Pharmaceutical: Lovenox ?            -antiplatelet therapy: DAPT X 3 weeks followed by ASA alone on 5/13 ?3. Pain Management: tylenol prn ? -added voltaren gel to RIGHT knee with some benefit. ?4. Mood: LCSW to follow for evaluation and support.  ?            -antipsychotic agents: N/a ?5. Neuropsych: This patient is capable of making decisions on her own behalf. ?6. Skin/Wound Care: routine pressure relief measures.  ?7. Fluids/Electrolytes/Nutrition: Monitor I/O.  Poor intake of fluid add IVF at noc, repeat 5/10 ? ?  Latest Ref Rng & Units 01/14/2022  ?  6:26 AM 01/07/2022  ?  5:51 AM 01/03/2022  ?  6:02 AM  ?BMP  ?Glucose 70 - 99 mg/dL 454128   098122   119130    ?BUN 6 - 20 mg/dL 23   22   17     ?Creatinine 0.44 - 1.00 mg/dL 1.471.75   8.291.42   5.621.62    ?Sodium 135 - 145 mmol/L 142   140   141    ?Potassium 3.5 - 5.1 mmol/L 4.0   3.9   4.0    ?Chloride 98 - 111  mmol/L 110   112   116    ?CO2 22 - 32 mmol/L 25   24   20     ?Calcium 8.9 - 10.3 mg/dL 8.3   8.0  7.8    ?  ?8. HTN: Monitor BP TID--continue Avapro and metoprolol ?            ?Vitals:  ? 01/14/22 2014 01/15/22 0344  ?BP: (!) 175/75 132/75  ?Pulse: 77 72  ?Resp: 17 20  ?Temp: 97.9 ?F (36.6 ?C) 98.1 ?F (36.7 ?C)  ?SpO2: 100% 100%  ?5/8 controlled  but Creat elevated will increase amlodipine to 10mg  reduce avapro to 75mg  and give IVF overnite x1 more night ?9.T2DM: Hgb A1C- 7.1 trending down as compliant with diet but not meds.  ?            --monitor BS ac/hs and use SSI for elevated BS ?            --BS variable from 74 to 235.  ?--Per [facility]--no meds picked up since Oct/Rx for basiglar and ozempic not picked up yet.  ?-- low dose Amaryl due to CKD.   ?CBG (last 3)  ?Recent Labs  ?  01/14/22 ?1655 01/14/22 ?2044 01/15/22 ?2045  ?GLUCAP 141* 278* 175*  ? ?5/9 elevated in p.m.  Will resume Ozempic and Basaglar at home post discharge ?10. History of depression/anxiety: Continue Celexa. Buproprion added for smoking cessation  ?11. Acute on chronic renal failure: SCr 1.33 at admission-->1.66 ?            -5/1 Cr 1.42 ?12. Insomnia: Secondary to anxiety. Trazodone prn added. ?--monitor for now.  ?13. Overweight: BMI 25.72: provide dietary education ?  ?14.  Anemia no obvious blood loss, Negative stool  guaic  ? ?  Latest Ref Rng & Units 01/14/2022  ?  6:26 AM 01/07/2022  ?  5:51 AM 01/03/2022  ?  6:02 AM  ?CBC  ?WBC 4.0 - 10.5 K/uL 7.9   9.9   9.0    ?Hemoglobin 12.0 - 15.0 g/dL 9.5   9.6   9.6    ?Hematocrit 36.0 - 46.0 % 28.8   28.7   29.0    ?Platelets 150 - 400 K/uL 259   256   202    ?15.  Hx of tobacco abuse start bupropion 150mg  per day , plavix may boost effect , monitor for HA  ?Start Gabapentin 400mg  BID for cocaine cessation  ?16.  Right knee buckling no pain no swelling, no change in manual muscle test, will ask OT to do some standing will see if this recurs, consider CT of the head if this is not a one-time  event LOS: ?13 days ?A FACE TO FACE EVALUATION WAS PERFORMED ? ?03/09/2022 Shakhia Gramajo ?01/15/2022, 8:52 AM  ? ?  ?

## 2022-01-15 NOTE — Progress Notes (Signed)
Physical Therapy Session Note ? ?Patient Details  ?Name: Stacey Lucas ?MRN: [id number] ?Date of Birth: [birth year] ? ?Today's Date: 01/15/2022 ?PT Individual Time: 4496-7591 ?PT Individual Time Calculation (min): 46 min  ? ?Short Term Goals: ?Week 1:  PT Short Term Goal 1 (Week 1): Pt will transfer to and from [facility] with min assist consistently ?PT Short Term Goal 1 - Progress (Week 1): Met ?PT Short Term Goal 2 (Week 1): Pt will Ambulate 27f with min assist and LRAD ?PT Short Term Goal 2 - Progress (Week 1): Met ?PT Short Term Goal 3 (Week 1): Pt will propell WC with supervision assist ?PT Short Term Goal 3 - Progress (Week 1): Met ?PT Short Term Goal 4 (Week 1): Pt will improve Berg balance scale by >7 points to demonstrate improved functional movement ?PT Short Term Goal 4 - Progress (Week 1): Met ?Week 2:  PT Short Term Goal 1 (Week 2): STG=LTG due to ELOS ? ?Skilled Therapeutic Interventions/Progress Updates:  ?Patient seated upright on EOB on entrance to room. Patient alert and agreeable to PT session.  ? ?Patient with no pain complaint throughout session. ? ?Therapeutic Activity: ?Transfers: Patient performed sit<>stand and stand pivot transfers throughout session with CGA/ MinA for balance. Provided vc/ tc for forward lean and controlled muscle activation throughout. ? ?Gait Training:  ?Patient ambulated 143 x2 using rollator with CGA/ MinA for balance/ knee pain. Demonstrated forward slide of R foot during swing phase requiring vc for increased step height. Provided vc/ tc for upright posture, proximity to rollator and posterior pelvic tilt. No buckling of knee this session.  ? ?Neuromuscular Re-ed: ?NMR facilitated during session with focus on BLE muscle activation. Pt guided in continuous reciprocation of BUE and BLE using NuStep L4 x 118m with focus on RLE IR for neutral hip positioning, full BLE extension.  No increase in pain noted during Nustep activity. NMR performed for improvements in motor control and  coordination, balance, sequencing, judgement, and self confidence/ efficacy in performing all aspects of mobility at highest level of independence.  ? ?Patient supine  in bed at end of session with brakes locked, bed alarm set, and all needs within reach. ? ? ?Therapy Documentation ?Precautions:  ?Precautions ?Precautions: Fall ?Precaution Comments: R charcot foot, R HP ?Restrictions ?Weight Bearing Restrictions: No ?General: ?  ?Vital Signs: ?Therapy Vitals ?Temp: (!) 97.5 ?F (36.4 ?C) ?Temp Source: Oral ?Pulse Rate: 69 ?Resp: 16 ?BP: 114/67 ?Patient Position (if appropriate): Lying ?Oxygen Therapy ?SpO2: 99 % ?O2 Device: Room Air ?Pain: ? Pt with mild to moderate pain complaint at R knee this session. Improves with movement.  ? ?Therapy/Group: Individual Therapy ? ?JuAlger SimonsT, [name] ?01/15/2022, 5:05 PM  ?

## 2022-01-16 ENCOUNTER — Other Ambulatory Visit (HOSPITAL_COMMUNITY): Payer: Self-pay

## 2022-01-16 DIAGNOSIS — D649 Anemia, unspecified: Secondary | ICD-10-CM

## 2022-01-16 DIAGNOSIS — N189 Chronic kidney disease, unspecified: Secondary | ICD-10-CM

## 2022-01-16 DIAGNOSIS — N179 Acute kidney failure, unspecified: Secondary | ICD-10-CM

## 2022-01-16 LAB — BASIC METABOLIC PANEL
Anion gap: 5 (ref 5–15)
BUN: 25 mg/dL — ABNORMAL HIGH (ref 6–20)
CO2: 25 mmol/L (ref 22–32)
Calcium: 8.5 mg/dL — ABNORMAL LOW (ref 8.9–10.3)
Chloride: 109 mmol/L (ref 98–111)
Creatinine, Ser: 1.94 mg/dL — ABNORMAL HIGH (ref 0.44–1.00)
GFR, Estimated: 32 mL/min — ABNORMAL LOW (ref >60)
Glucose, Bld: 130 mg/dL — ABNORMAL HIGH (ref 70–99)
Potassium: 4.7 mmol/L (ref 3.5–5.1)
Sodium: 139 mmol/L (ref 135–145)

## 2022-01-16 LAB — GLUCOSE, CAPILLARY
Glucose-Capillary: 115 mg/dL — ABNORMAL HIGH (ref 70–99)
Glucose-Capillary: 148 mg/dL — ABNORMAL HIGH (ref 70–99)
Glucose-Capillary: 196 mg/dL — ABNORMAL HIGH (ref 70–99)
Glucose-Capillary: 98 mg/dL (ref 70–99)

## 2022-01-16 MED ORDER — DICLOFENAC SODIUM 1 % EX GEL
4.0000 g | Freq: Every day | CUTANEOUS | 1 refills | Status: DC | PRN
  Filled 2022-01-16: qty 400, 30d supply, fill #0

## 2022-01-16 MED ORDER — ROSUVASTATIN CALCIUM 20 MG PO TABS: 20.0000 mg | ORAL_TABLET | Freq: Every day | ORAL | Status: DC

## 2022-01-16 MED ORDER — ROSUVASTATIN CALCIUM 20 MG PO TABS
20.0000 mg | ORAL_TABLET | Freq: Every day | ORAL | 0 refills | Status: DC
  Filled 2022-01-16: qty 30, 30d supply, fill #0

## 2022-01-16 MED ORDER — INSULIN PEN NEEDLE 32G X 4 MM MISC
1.0000 | Freq: Every day | 0 refills | Status: DC
Start: 2022-01-16 — End: 2022-03-07
  Filled 2022-01-16: qty 100, 100d supply, fill #0

## 2022-01-16 MED ORDER — METOPROLOL SUCCINATE ER 25 MG PO TB24
75.0000 mg | ORAL_TABLET | Freq: Every day | ORAL | 0 refills | Status: DC
  Filled 2022-01-16: qty 180, 60d supply, fill #0

## 2022-01-16 MED ORDER — IRBESARTAN 75 MG PO TABS
75.0000 mg | ORAL_TABLET | Freq: Every day | ORAL | 0 refills | Status: DC
  Filled 2022-01-16: qty 30, 30d supply, fill #0

## 2022-01-16 MED ORDER — PANTOPRAZOLE SODIUM 40 MG PO TBEC
40.0000 mg | DELAYED_RELEASE_TABLET | Freq: Every day | ORAL | 0 refills | Status: DC
Start: 2022-01-16 — End: 2022-02-14
  Filled 2022-01-16: qty 30, 30d supply, fill #0

## 2022-01-16 MED ORDER — INSULIN GLARGINE 100 UNIT/ML SOLOSTAR PEN
5.0000 [IU] | PEN_INJECTOR | Freq: Every day | SUBCUTANEOUS | 0 refills | Status: DC
  Filled 2022-01-16: qty 3, 28d supply, fill #0

## 2022-01-16 MED ORDER — AMLODIPINE BESYLATE 10 MG PO TABS
10.0000 mg | ORAL_TABLET | Freq: Every day | ORAL | 0 refills | Status: DC
  Filled 2022-01-16: qty 30, 30d supply, fill #0

## 2022-01-16 MED ORDER — GLIMEPIRIDE 2 MG PO TABS
2.0000 mg | ORAL_TABLET | Freq: Every day | ORAL | 0 refills | Status: DC
Start: 2022-01-16 — End: 2022-02-14
  Filled 2022-01-16: qty 30, 30d supply, fill #0

## 2022-01-16 MED ORDER — BUPROPION HCL ER (XL) 150 MG PO TB24
150.0000 mg | ORAL_TABLET | Freq: Every day | ORAL | 0 refills | Status: DC
Start: 2022-01-16 — End: 2022-02-14
  Filled 2022-01-16: qty 30, 30d supply, fill #0

## 2022-01-16 MED ORDER — CITALOPRAM HYDROBROMIDE 10 MG PO TABS
10.0000 mg | ORAL_TABLET | Freq: Every day | ORAL | 0 refills | Status: DC
  Filled 2022-01-16: qty 30, 30d supply, fill #0

## 2022-01-16 MED ORDER — CLOPIDOGREL BISULFATE 75 MG PO TABS
75.0000 mg | ORAL_TABLET | Freq: Every day | ORAL | 1 refills | Status: DC
  Filled 2022-01-16: qty 34, 34d supply, fill #0

## 2022-01-16 MED ORDER — ASPIRIN 81 MG PO TBEC
81.0000 mg | DELAYED_RELEASE_TABLET | Freq: Every day | ORAL | 0 refills | Status: DC
  Filled 2022-01-16: qty 30, 30d supply, fill #0

## 2022-01-16 NOTE — Progress Notes (Signed)
Physical Therapy Discharge Summary ? ?Patient Details  ?Name: Stacey Lucas ?MRN: [id number] ?Date of Birth: [birth year] ? ?Today's Date: 01/16/2022 ?PT Individual Time: 5621-3086 ?PT Individual Time Calculation (min): 54 min  ? ? ?Patient has met 8 of 10 long term goals due to improved activity tolerance, improved balance, improved postural control, increased strength, increased range of motion, decreased pain, ability to compensate for deficits, functional use of  right upper extremity and right lower extremity, improved attention, improved awareness, and improved coordination.  Patient to discharge at an ambulatory level Modified Independent.   Patient's care partner unavailable to provide the necessary physical assistance at discharge. ? ?Reasons goals not met: balance deficits limit completion of all goals from reported lethargy from gabapentin  ? ?Recommendation:  ?Patient will benefit from ongoing skilled PT services in home health setting to continue to advance safe functional mobility, address ongoing impairments in balance, transfers, endurance, strength, gait, and minimize fall risk. ? ?Equipment: ?No equipment provided ? ?Reasons for discharge: treatment goals met and discharge from hospital ? ?Patient/family agrees with progress made and goals achieved: Yes ? ?PT Discharge ?Precautions/Restrictions ?Precautions ?Precautions: Fall ?Precaution Comments: R HP and ongoing kne pain ?Restrictions ?Weight Bearing Restrictions: No ?Vital Signs ?Therapy Vitals ?Temp: 97.8 ?F (36.6 ?C) ?Temp Source: Oral ?Pulse Rate: 68 ?Resp: 16 ?BP: (!) 129/51 ?Patient Position (if appropriate): Sitting ?Oxygen Therapy ?SpO2: 97 % ?O2 Device: Room Air ?Pain ?Pain Assessment ?Pain Scale: 0-10 ?Pain Score: 0-No pain ?Pain Interference ?Pain Interference ?Pain Effect on Sleep: 1. Rarely or not at all ?Pain Interference with Therapy Activities: 1. Rarely or not at all ?Pain Interference with Day-to-Day Activities: 1. Rarely or not at  all ?Vision/Perception  ?Vision - History ?Ability to See in Adequate Light: 0 Adequate ?Vision - Assessment ?Eye Alignment: Within Functional Limits ?Ocular Range of Motion: Within Functional Limits ?Alignment/Gaze Preference: Within Defined Limits ?Tracking/Visual Pursuits: Able to track stimulus in all quads without difficulty ?Saccades: Within functional limits ?Convergence: Within functional limits ?Perception ?Perception: Within Functional Limits ?Praxis ?Praxis: Intact  ?Cognition ?Overall Cognitive Status: Within Functional Limits for tasks assessed ?Arousal/Alertness: Awake/alert ?Sustained Attention: Appears intact ?Selective Attention: Appears intact ?Memory: Appears intact ?Awareness: Appears intact ?Problem Solving: Appears intact ?Safety/Judgment: Appears intact ?Sensation ?Sensation ?Light Touch: Appears Intact ?Hot/Cold: Appears Intact ?Proprioception: Impaired Detail ?Proprioception Impaired Details: Impaired RUE;Impaired RLE ?Stereognosis: Appears Intact ?Coordination ?Gross Motor Movements are Fluid and Coordinated: No ?Fine Motor Movements are Fluid and Coordinated: No ?Coordination and Movement Description: mild R HP ?Finger Nose Finger Test: slower on R but grossly WNL ?Heel Shin Test: hemiplegia on the RLE. decreaesd ROM and coordination ?9 Hole Peg Test: R: 2 min 12 seconds, L : 1 min 52 seconds ?Motor  ?Motor ?Motor: Hemiplegia ?Motor - Discharge Observations: mild R hemiparesis ( greatly improved from Eval), L foot drop  ?Mobility ?Bed Mobility ?Bed Mobility: Supine to Sit;Rolling Right;Rolling Left;Sit to Supine ?Rolling Right: Independent with assistive device ?Rolling Left: Independent with assistive device ?Supine to Sit: Independent with assistive device ?Sit to Supine: Independent with assistive device ?Transfers ?Transfers: Sit to Stand;Stand Pivot Transfers;Stand to Sit ?Sit to Stand: Independent with assistive device ?Stand to Sit: Independent with assistive device ?Stand Pivot  Transfers: Independent with assistive device ?Stand Pivot Transfer Details: Verbal cues for precautions/safety;Manual facilitation for placement;Verbal cues for technique ?Transfer (Assistive device): Rolling walker ?Locomotion  ?Gait ?Ambulation: Yes ?Gait Assistance: Independent with assistive device ?Gait Distance (Feet): 150 Feet ?Assistive device: Rolling walker ?Gait ?Gait: Yes ?Gait Pattern: Impaired ?Gait  Pattern: Narrow base of support;Trunk flexed;Lateral hip instability ?Stairs / Additional Locomotion ?Stairs: Yes ?Stairs Assistance: Contact Guard/Touching assist ?Stair Management Technique: Two rails ?Number of Stairs: 12 ?Height of Stairs: 6 (and 3) ?Wheelchair Mobility ?Wheelchair Assistance: Supervision/Verbal cueing ?Wheelchair Propulsion: Both upper extremities ?Wheelchair Parts Management: Needs assistance ?Distance: 150  ?Trunk/Postural Assessment  ?Cervical Assessment ?Cervical Assessment: Within Functional Limits ?Thoracic Assessment ?Thoracic Assessment: Exceptions to [facility] (mild rounded shoulders) ?Lumbar Assessment ?Lumbar Assessment: Exceptions to [facility] (posterior pelvic tilt in sitting) ?Postural Control ?Postural Control: Deficits on evaluation (episode of R knee buckling)  ?Balance ?Balance ?Balance Assessed: Yes ?Standardized Balance Assessment ?Standardized Balance Assessment: Merrilee Jansky Balance Test ?Merrilee Jansky Balance Test ?Sit to Stand: Able to stand  independently using hands ?Standing Unsupported: Able to stand 30 seconds unsupported ?Sitting with Back Unsupported but Feet Supported on Floor or Stool: Able to sit safely and securely 2 minutes ?Stand to Sit: Controls descent by using hands ?Transfers: Able to transfer safely, definite need of hands ?Standing Unsupported with Eyes Closed: Unable to keep eyes closed 3 seconds but stays steady ?Standing Ubsupported with Feet Together: Needs help to attain position and unable to hold for 15 seconds ?From Standing, Reach Forward with Outstretched Arm:  Can reach forward >12 cm safely (5") ?From Standing Position, Pick up Object from Floor: Unable to try/needs assist to keep balance ?From Standing Position, Turn to Look Behind Over each Shoulder: Needs supervision when turning ?Turn 360 Degrees: Needs assistance while turning ?Standing Unsupported, Alternately Place Feet on Step/Stool: Needs assistance to keep from falling or unable to try ?Standing Unsupported, One Foot in Front: Loses balance while stepping or standing ?Standing on One Leg: Unable to try or needs assist to prevent fall ?Total Score: 20 ?Static Sitting Balance ?Static Sitting - Balance Support: Feet supported ?Static Sitting - Level of Assistance: 6: Modified independent (Device/Increase time) ?Dynamic Sitting Balance ?Dynamic Sitting - Balance Support: Feet supported ?Dynamic Sitting - Level of Assistance: 6: Modified independent (Device/Increase time) ?Dynamic Sitting - Balance Activities: Reaching across midline;Reaching for objects ?Static Standing Balance ?Static Standing - Balance Support: Bilateral upper extremity supported ?Static Standing - Level of Assistance: 6: Modified independent (Device/Increase time) ?Dynamic Standing Balance ?Dynamic Standing - Balance Support: Bilateral upper extremity supported ?Dynamic Standing - Level of Assistance: 6: Modified independent (Device/Increase time) ?Extremity Assessment  ?RUE Assessment ?General Strength Comments: mild R HP, able to use dominantly for tasks but continues to require assist for opening items ?RUE Body System: Neuro ?Brunstrum levels for arm and hand: Arm;Hand ?Brunstrum level for arm: Stage V Relative Independence from [employer]nergy ?Brunstrum level for hand: Stage VI Isolated joint movements ?LUE Assessment ?LUE Assessment: Within Functional Limits ?RLE Assessment ?RLE Assessment: Exceptions to [facility] ?General Strength Comments: grossly 4-/5 to 4/5 proximal to distal through MMT. ?LLE Assessment ?LLE Assessment: Exceptions to [facility] ?General  Strength Comments: grossly 4+/5 except hip flexion 4/5 and ankle DF 3-/5 ? ? ? ?Lorie Phenix ?01/16/2022, 4:01 PM ?

## 2022-01-16 NOTE — Progress Notes (Signed)
Occupational Therapy Session Note ? ?Patient Details  ?Name: Stacey Lucas ?MRN: [id number] ?Date of Birth: [birth year] ? ?Today's Date: 01/16/2022 ?OT Individual Time: 1601-0932 ?OT Individual Time Calculation (min): 56 min  ? ? ?Short Term Goals: ?Week 2:  OT Short Term Goal 1 (Week 2): STG = LTG 2/2 ELOS ? ?Skilled Therapeutic Interventions/Progress Updates:  ?Pt greeted in bathroom with NT present pt agreeable to OT intervention. Session focus on BADL reeducation, functional mobility, dynamic standing balance and decreasing overall caregiver burden.         ?Pt completed 3/3 toileting tasks with supervision able to ambulate to sink for hand hygiene with CGA, CGA fro standing grooming tasks. Pt ambulated into walkin shower with MINA for increased safety without foot brace donned. Pt completed bathing tasks from shower seat with overall supervision, MIN A to exit shower with RW for increased safety. Pt completed dressing from EOB with set- up for UB dressing and  MIN A LB dressing via sit>stand, MIN A needed for shoes and socks. Spent some time practicing with donning brace with pt needing + time but able to don with MINA. Pt completed functional ambulation to day room with Rw and cGA, noted flexed posture on RW and poor walker proximity during gait needing MOD cues to correct. Pt completed IADL task of ambulating in hallway around tight spaces to collect wash clothes to simulate cleaning house and to challenge dynamic balance. Pt completed task with CGA.         pt left seated EOB with all needs within reach.                      ? ? ?Therapy Documentation ?Precautions:  ?Precautions ?Precautions: Fall ?Precaution Comments: R HP and ongoing kne pain ?Restrictions ?Weight Bearing Restrictions: No ? ?Pain: no pain reported during session  ? ? ? ?Therapy/Group: Individual Therapy ? ?Barron Schmid ?01/16/2022, 3:44 PM ?

## 2022-01-16 NOTE — Progress Notes (Signed)
Inpatient Rehabilitation Discharge Medication Review by a Pharmacist ? ?A complete drug regimen review was completed for this patient to identify any potential clinically significant medication issues. ? ?High Risk Drug Classes Is patient taking? Indication by Medication  ?Antipsychotic No   ?Anticoagulant No   ?Antibiotic No   ?Opioid No   ?Antiplatelet Yes Aspirin, clopidogrel for CVA  ?Hypoglycemics/insulin Yes Amaryl, insulin for DM  ?Vasoactive Medication Yes Amlodipine, metoprolol, irbesartan for BP  ?Chemotherapy No   ?Other Yes Celexa for mood ?Gabapentin for pain?, stop at dc for balance issues? ?Wellbutrin for smoking ?Crestor for HLD ?Protonix for GERD  ? ? ? ?Type of Medication Issue Identified Description of Issue Recommendation(s)  ?Drug Interaction(s) (clinically significant) ?    ?Duplicate Therapy ?    ?Allergy ?    ?No Medication Administration End Date ?    ?Incorrect Dose ?    ?Additional Drug Therapy Needed ?    ?Significant med changes from prior encounter (inform family/care partners about these prior to discharge).    ?Other ?    ? ? ?Clinically significant medication issues were identified that warrant physician communication and completion of prescribed/recommended actions by midnight of the next day:  No ? ? ?Pharmacist comments: End date added for Plavix 01/19/22 ? ?Time spent performing this drug regimen review (minutes):  20 minutes ? ? ?Elwin Sleight ?01/16/2022 8:32 AM ?

## 2022-01-16 NOTE — Progress Notes (Signed)
?                                                       PROGRESS NOTE ? ? ?Subjective/Complaints: ? ?Discussed smoking and cocaine cessation  ? ?ROS: Patient denies CP, SOB, N/V/D ? ? ?Objective: ?  ?No results found. ? ?Recent Labs  ?  01/14/22 ?0626  ?WBC 7.9  ?HGB 9.5*  ?HCT 28.8*  ?PLT 259  ? ? ? ?Recent Labs  ?  01/14/22 ?0626  ?NA 142  ?K 4.0  ?CL 110  ?CO2 25  ?GLUCOSE 128*  ?BUN 23*  ?CREATININE 1.75*  ?CALCIUM 8.3*  ? ? ? ? ?Intake/Output Summary (Last 24 hours) at 01/16/2022 0935 ?Last data filed at 01/15/2022 1810 ?Gross per 24 hour  ?Intake 725.73 ml  ?Output --  ?Net 725.73 ml  ? ?  ? ?  ? ?Physical Exam: ?Vital Signs ?Blood pressure 115/68, pulse 70, temperature 97.8 ?F (36.6 ?C), resp. rate 18, weight 70.1 kg, SpO2 98 %. ? ? ?General: No acute distress ?Mood and affect are appropriate ?Heart: Regular rate and rhythm no rubs murmurs or extra sounds ?Lungs: Clear to auscultation, breathing unlabored, no rales or wheezes ?Abdomen: Positive bowel sounds, soft nontender to palpation, nondistended ?Extremities: No clubbing, cyanosis, or edema ? ?Skin: No evidence of breakdown, no evidence of rash ?Neurologic: Dysarthria Cranial nerves II through XII intact, motor strength is 3/5 in Right and 4- Left  deltoid, bicep, tricep, grip, hip flexor, knee extensors, ankle dorsiflexor and plantar flexor ?Sensory exam normal for light touch and pain in all 4 limbs. No limb ataxia or cerebellar signs. No abnormal tone appreciated.   ?Musculoskeletal: Full range of motion in all 4 extremities. Right knee mild medial tenderness no swelling or effusion ? ? ?Assessment/Plan: ?1. Functional deficits which require 3+ hours per day of interdisciplinary therapy in a comprehensive inpatient rehab setting. ?Physiatrist is providing close team supervision and 24 hour management of active medical problems listed below. ?Physiatrist and rehab team continue to assess barriers to discharge/monitor patient progress toward functional and  medical goals ? ?Care Tool: ? ?Bathing ?   ?Body parts bathed by patient: Right arm, Left arm, Chest, Abdomen, Face, Left lower leg, Right lower leg, Right upper leg, Left upper leg, Buttocks, Front perineal area  ?   ?  ?  ?Bathing assist Assist Level: Contact Guard/Touching assist ?  ?  ?Upper Body Dressing/Undressing ?Upper body dressing   ?What is the patient wearing?: Pull over shirt ?   ?Upper body assist Assist Level: Set up assist ?   ?Lower Body Dressing/Undressing ?Lower body dressing ? ? ?   ?What is the patient wearing?: Underwear/pull up, Pants ? ?  ? ?Lower body assist Assist for lower body dressing: Contact Guard/Touching assist ?   ? ?Toileting ?Toileting Toileting Activity did not occur (Probation officer and hygiene only): N/A (no void or bm)  ?Toileting assist Assist for toileting: Contact Guard/Touching assist ?  ?  ?Transfers ?Chair/bed transfer ? ?Transfers assist ?   ? ?Chair/bed transfer assist level: Contact Guard/Touching assist ?  ?  ?Locomotion ?Ambulation ? ? ?Ambulation assist ? ?   ? ?Assist level: Contact Guard/Touching assist ?Assistive device:  (rollator and foot up brace L) ?Max distance: 150'  ? ?Walk 10 feet activity ? ? ?Assist ?   ? ?  Assist level: Supervision/Verbal cueing ?Assistive device: Orthosis, Rollator  ? ?Walk 50 feet activity ? ? ?Assist Walk 50 feet with 2 turns activity did not occur: Safety/medical concerns ? ?Assist level: Contact Guard/Touching assist ?Assistive device: Orthosis, Rollator  ? ? ?Walk 150 feet activity ? ? ?Assist Walk 150 feet activity did not occur: Safety/medical concerns ? ?Assist level: Contact Guard/Touching assist ?Assistive device: Rollator, Orthosis ?  ? ?Walk 10 feet on uneven surface  ?activity ? ? ?Assist Walk 10 feet on uneven surfaces activity did not occur: Safety/medical concerns ? ? ?  ?   ? ?Wheelchair ? ? ? ? ?Assist Is the patient using a wheelchair?: Yes ?Type of Wheelchair: Manual ?  ? ?Wheelchair assist level: Minimal  Assistance - Patient > 75% ?Max wheelchair distance: 150  ? ? ?Wheelchair 50 feet with 2 turns activity ? ? ? ?Assist ? ?  ?  ? ? ?Assist Level: Minimal Assistance - Patient > 75%  ? ?Wheelchair 150 feet activity  ? ? ? ?Assist ?   ? ? ?Assist Level: Minimal Assistance - Patient > 75%  ? ?Blood pressure 115/68, pulse 70, temperature 97.8 ?F (36.6 ?C), resp. rate 18, weight 70.1 kg, SpO2 98 %. ? ?Medical Problem List and Plan: ?1. 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            -patient may shower ?            -ELOS/Goals: 5/11 d/c date- discussed smoking and cocaine cessation given they are major risk factors for recurrent CVA  ?         -Continue CIR therapies including PT, OT  ? ? ?2.  Antithrombotics: ?-DVT/anticoagulation:  Pharmaceutical: Lovenox ?            -antiplatelet therapy: DAPT X 3 weeks followed by ASA alone on 5/13 ?3. Pain Management: tylenol prn ? -added voltaren gel to RIGHT knee with some benefit. ?4. Mood: LCSW to follow for evaluation and support.  ?            -antipsychotic agents: N/a ?5. Neuropsych: This patient is capable of making decisions on her own behalf. ?6. Skin/Wound Care: routine pressure relief measures.  ?7. Fluids/Electrolytes/Nutrition: Monitor I/O.   ? ?  Latest Ref Rng & Units 01/14/2022  ?  6:26 AM 01/07/2022  ?  5:51 AM 01/03/2022  ?  6:02 AM  ?BMP  ?Glucose 70 - 99 mg/dL 128   122   130    ?BUN 6 - 20 mg/dL 23   22   17     ?Creatinine 0.44 - 1.00 mg/dL 1.75   1.42   1.62    ?Sodium 135 - 145 mmol/L 142   140   141    ?Potassium 3.5 - 5.1 mmol/L 4.0   3.9   4.0    ?Chloride 98 - 111 mmol/L 110   112   116    ?CO2 22 - 32 mmol/L 25   24   20     ?Calcium 8.9 - 10.3 mg/dL 8.3   8.0   7.8    ? Recheck BMET, d/c IVF  ?8. HTN: Monitor BP TID--continue Avapro and metoprolol ?            ?Vitals:  ? 01/15/22 1941 01/16/22 0355  ?BP: 128/70 115/68  ?Pulse: 77 70  ?Resp: 17 18  ?Temp: 98.3 ?F (36.8 ?C) 97.8 ?F (36.6 ?C)  ?SpO2:  100% 98%  ?  Controlled 5/10 ?9.T2DM: Hgb A1C- 7.1 trending down as compliant with diet but not meds.  ?            --monitor BS ac/hs and use SSI for elevated BS ?            --BS variable from 74 to 235.  ?--Per [facility]--no meds picked up since Oct/Rx for basiglar and ozempic not picked up yet.  ?-- low dose Amaryl due to CKD.   ?CBG (last 3)  ?Recent Labs  ?  01/15/22 ?1636 01/15/22 ?2053 01/16/22 ?ID:[id number]  ?GLUCAP 167* 287* 115*  ? ?5/9 elevated in p.m.  Will resume Ozempic and Basaglar at home post discharge ?10. History of depression/anxiety: Continue Celexa. Buproprion added for smoking cessation  ?11. Acute on chronic renal failure: SCr 1.33 at admission-->1.66 ?            -5/1 Cr 1.42 ?12. Insomnia: Secondary to anxiety. Trazodone prn added. ?--monitor for now.  ?13. Overweight: BMI 25.72: provide dietary education ?  ?14.  Anemia no obvious blood loss, Negative stool  guaic  ? ?  Latest Ref Rng & Units 01/14/2022  ?  6:26 AM 01/07/2022  ?  5:51 AM 01/03/2022  ?  6:02 AM  ?CBC  ?WBC 4.0 - 10.5 K/uL 7.9   9.9   9.0    ?Hemoglobin 12.0 - 15.0 g/dL 9.5   9.6   9.6    ?Hematocrit 36.0 - 46.0 % 28.8   28.7   29.0    ?Platelets 150 - 400 K/uL 259   256   202    ?15.  Hx of tobacco abuse start bupropion 150mg  per day , plavix may boost effect , monitor for HA  ?Started Gabapentin 400mg  BID for cocaine cessation but this makes pt feel weak, d/c ?16.  Right knee buckling no pain no swelling, no change in manual muscle test, will ask OT to do some standing will see if this recurs, consider CT of the head if this is not a one-time event LOS: ?14 days ?A FACE TO FACE EVALUATION WAS PERFORMED ? ?Stacey Lucas ?01/16/2022, 9:35 AM  ? ?  ?

## 2022-01-16 NOTE — Progress Notes (Signed)
Occupational Therapy Discharge Summary ? ?Patient Details  ?Name: Niccole Witthuhn ?MRN: [id number] ?Date of Birth: Jan 22, 1976 ? ?Today's Date: 01/16/2022 ?OT Individual Time: [phone] ?OT Individual Time Calculation (min): 68 min  ? ? ?Patient has met 1 of 12 long term goals due to improved activity tolerance, improved balance, postural control, ability to compensate for deficits, functional use of  RIGHT upper and RIGHT lower extremity, and improved coordination.  Patient to discharge at overall  CGA to min A  level.  Patient's care partner unavailable to provide the necessary physical assistance at discharge.  Pt to DC at the below ADL/bathroom transfer performance level. Assist levels represent pt's most consistent performance when alert/participatory. Pt continues to be primarily limited by R hemiparesis, ongoing R knee pain, chronic L foot drop, and new balance deficits + R knee buckling. Pt alone most of the day as boyfriend and daughter work. Pt and caregivers will benefit from continued OP OT to facilitate improved caregiver education, functional mobility, and occupational performance.  ? ? ?Reasons goals not met: Mod I Goals not met due to functional decline on grad day/new balance deficits pt attributes to change in medication regime/gabapentin. Pt with episodes of R knee buckling in the morning and requiring overall CGA steadying assist with all mobility with RW. Needs min to mod assist with LBD and EOB balance due to feeling "weaker" and ongoing R knee pain. Expect for new found balance deficits to improve as MD Dced gabapentin. Pt to use the RW per PT rec upon DC   ? ?Recommendation:  ?Patient will benefit from ongoing skilled OT services in outpatient setting to continue to advance functional skills in the area of BADL, iADL, Vocation, and Reduce care partner burden. ? ?Equipment: ?TTB ? ?Reasons for discharge: discharge from hospital ? ?Patient/family agrees with progress made and goals achieved:  Yes ? ?OT Discharge ?Precautions/Restrictions  ?Precautions ?Precautions: Fall ?Precaution Comments: R HP and ongoing kne pain ?Restrictions ?Weight Bearing Restrictions: No ? ?Pain ?Pain Assessment ?Pain Scale: 0-10 ?Pain Score: 0-No pain ?ADL ?ADL ?Eating: Set up ?Where Assessed-Eating: Bed level, Chair ?Grooming: Supervision/safety, Contact guard ?Where Assessed-Grooming: Standing at sink ?Upper Body Bathing: Supervision/safety ?Where Assessed-Upper Body Bathing: Shower ?Lower Body Bathing: Contact guard ?Where Assessed-Lower Body Bathing: Shower ?Upper Body Dressing: Setup ?Where Assessed-Upper Body Dressing: Edge of bed ?Lower Body Dressing: Minimal assistance ?Where Assessed-Lower Body Dressing: Edge of bed ?Toileting: Contact guard ?Where Assessed-Toileting: Toilet ?Toilet Transfer: Contact guard ?Toilet Transfer Method: Ambulating ?Science writer: Grab bars, Raised toilet seat ?Tub/Shower Transfer: Not assessed ?Walk-In Shower Transfer: Contact guard, Minimal assistance ?Walk-In Shower Transfer Method: Ambulating ?Walk-In Shower Equipment: Radio broadcast assistant, Grab bars ?Vision ?Baseline Vision/History: 0 No visual deficits ?Patient Visual Report: No change from baseline ?Vision Assessment?: No apparent visual deficits ?Perception  ?Perception: Within Functional Limits ?Praxis ?Praxis: Intact ?Cognition ?Cognition ?Overall Cognitive Status: Within Functional Limits for tasks assessed ?Arousal/Alertness: Awake/alert ?Orientation Level: Person;Place;Situation ?Person: Oriented ?Place: Oriented ?Situation: Oriented ?Memory: Appears intact ?Sustained Attention: Appears intact ?Selective Attention: Appears intact ?Awareness: Appears intact ?Problem Solving: Appears intact ?Safety/Judgment: Appears intact ?Brief Interview for Mental Status (BIMS) ?Repetition of Three Words (First Attempt): 3 ?Temporal Orientation: Year: Correct ?Temporal Orientation: Month: Accurate within 5 days ?Temporal Orientation:  Day: Correct ?Recall: "Sock": Yes, no cue required ?Recall: "Blue": Yes, no cue required ?Recall: "Bed": Yes, no cue required ?BIMS Summary Score: 15 ?Sensation ?Sensation ?Light Touch: Appears Intact ?Hot/Cold: Appears Intact ?Proprioception: Impaired Detail ?Proprioception Impaired Details: Impaired RUE;Impaired RLE ?Stereognosis: Appears Intact ?  Coordination ?Gross Motor Movements are Fluid and Coordinated: No ?Fine Motor Movements are Fluid and Coordinated: No ?Coordination and Movement Description: mild R HP ?Finger Nose Finger Test: slower on R but grossly WNL ?9 Hole Peg Test: R: 2 min 12 seconds, L : 1 min 52 seconds ?Motor  ?Motor ?Motor: Hemiplegia ?Motor - Discharge Observations: mild R hemiparesis, L foot drop ?Mobility  ?Bed Mobility ?Bed Mobility: Supine to Sit ?Supine to Sit: Contact Guard/Touching assist ?Transfers ?Sit to Stand: Contact Guard/Touching assist;Minimal Assistance - Patient > 75% ?Stand to Sit: Supervision/Verbal cueing  ?Trunk/Postural Assessment  ?Cervical Assessment ?Cervical Assessment: Within Functional Limits ?Thoracic Assessment ?Thoracic Assessment: Exceptions to [facility] (mild rounded shoulders) ?Lumbar Assessment ?Lumbar Assessment: Exceptions to [facility] (posterior pelvic tilt in sitting) ?Postural Control ?Postural Control: Deficits on evaluation (episode of R knee buckling)  ?Balance ?Balance ?Balance Assessed: Yes ?Static Sitting Balance ?Static Sitting - Balance Support: Feet supported ?Static Sitting - Level of Assistance: 6: Modified independent (Device/Increase time) ?Dynamic Sitting Balance ?Dynamic Sitting - Balance Support: Feet supported ?Dynamic Sitting - Level of Assistance: 5: Stand by assistance ?Dynamic Sitting - Balance Activities: Reaching across midline;Reaching for objects ?Static Standing Balance ?Static Standing - Balance Support: No upper extremity supported;Bilateral upper extremity supported ?Static Standing - Level of Assistance: 5: Stand by  assistance ?Dynamic Standing Balance ?Dynamic Standing - Balance Support: Bilateral upper extremity supported;During functional activity ?Dynamic Standing - Level of Assistance: 4: Min assist;5: Stand by assistance ?Extremity/Trunk Assessment ?RUE Assessment ?General Strength Comments: mild R HP, able to use dominantly for tasks but continues to require assist for opening items ?RUE Body System: Neuro ?Brunstrum levels for arm and hand: Arm;Hand ?Brunstrum level for arm: Stage V Relative Independence from Synergy ?Brunstrum level for hand: Stage VI Isolated joint movements ?LUE Assessment ?LUE Assessment: Within Functional Limits ?Session Note: Pt received semi-reclined in bed, agreeable to therapy. Session focus on self-care retraining, activity tolerance, dynamic standing balance in prep for improved ADL/IADL/func mobility performance + decreased caregiver burden. ? ?Reports ongoing R knee pain, RN made aware. Additionally reports new balance deficits, attributes to gabapentin. Unable to get out of bed this am with nursing staff. Already dressed, able to doff socks with close S. Did require mod A overall after extended time given to don L toe up brace and B shoes, functional decline since pt able ot complete this with only min A last week. Additionally slipping off edge of bed requiring assist to safely scoot hips back.  ? ?Sit to stand with heavy min A and elevated height bed, R knee buckle but able to maintain balance. Practiced side stepping to her R at similar assist level with soft R knee and difficulty putting full weight through it. Ambulation and balance did improve to CGA and light min A to manage RW throughout session, but unable to ambulate all the way back to room due to fatigue and increasing R knee pain. ? ?Ambulatory toilet transfer with CGA and RW, CGA for balance during toileting tasks post continent void of bladder. ? ?Dc reassessments completed as documented above. ? ?Pt left seated EOB with bed  alarm engaged, RN present, call bell in reach, and all immediate needs met.  ? ? ?Volanda Napoleon MS, OTR/L ? ?01/16/2022, 3:18 PM ?

## 2022-01-16 NOTE — Patient Care Conference (Signed)
Inpatient RehabilitationTeam Conference and Plan of Care Update ?Date: 01/16/2022   Time: 10:29 AM  ? ? ?Patient Name: Stacey Lucas      ?Medical Record Number: [id number]  ?Date of Birth: [birth year] ?Sex: Female         ?Room/Bed: 7C62B/7S28B-15 ?Payor Info: Payor: Pharmacist, hospital / Plan: [employer] PPO / Product Type: *No Product type* /   ? ?Admit Date/Time:  01/02/2022  3:39 PM ? ?Primary Diagnosis:  CVA (cerebral vascular accident) (HCC) ? ?Hospital Problems: Principal Problem: ?  CVA (cerebral vascular accident) (HCC) ? ? ? ?Expected Discharge Date: Expected Discharge Date: 01/17/22 ? ?Team Members Present: ?Physician leading conference: Dr. Claudette Laws ?Social Worker Present: Lavera Guise, BSW ?Nurse Present: Chana Bode, RN ?PT Present: Grier Rocher, PT ?OT Present: Annye English, OT ?SLP Present: Eilene Ghazi, SLP ?PPS Coordinator present : Fae Pippin, SLP ? ?   Current Status/Progress Goal Weekly Team Focus  ?Bowel/Bladder ? ? cont of B/B. last BM- 5/9  remain continent.  assess q shift and PRN   ?Swallow/Nutrition/ Hydration ? ?           ?ADL's ? ? Supervision/CGA bathroom transfers, supervision/CGA bathing/dressing, almost all supervision, but occasional LOB needing CGA  S bathroom transfers/IADL, mod I BADL  dc planning, pt/family education, self-care retrainig , R UE NMR   ?Mobility ? ? Bed mobility = Mod I; Transfers = CGA/ light MinA for balance; Ambulation = CGA for balance using rollator; Stairs = MinA with BHR  Overall SUP  RLE>L NMR, static and dynamic standing balance, ambulation with and without AD, family educ   ?Communication ? ?           ?Safety/Cognition/ Behavioral Observations ?           ?Pain ? ? c/o pain 6 of 10 to R knee, pain management in progress, effective.  pain<3  assess pain q shift and PRN   ?Skin ? ? intact  remain intact  assess skin q shift and PRN   ? ? ?Discharge Planning:  ?Discharging on Thurs. Patient OP referral sent to Neuro Rehab    ?Team Discussion: ?Patient experiencing knee buckling and right knee swelling and functional decline with medication adjustment over the weekend.  ?Patient on target to meet rehab goals: ?yes, patient had been doing well overall but required min assistCGA for transfers due to balance issues and min assist for steps. She is safer using a RW than with the rollator ? ?*See Care Plan and progress notes for long and short-term goals.  ? ?Revisions to Treatment Plan:  ?Upgraded goals to mod I ?SLP discontinued services ?  ?Teaching Needs: ?Safety, medications, secondary risk management, transfers, etc  ?Current Barriers to Discharge: ?Decreased caregiver support and Home enviroment access/layout ? ?Possible Resolutions to Barriers: ?Family education ?OP follow up services ?DME: TTB ?  ? ? Medical Summary ?Current Status: BP controlled , weakness with gabapentin ? Barriers to Discharge: Medical stability ?  ?Possible Resolutions to [employer]: caregiver training, trim d/c meds, needs PCP ? ? ?Continued Need for Acute Rehabilitation Level of Care: The patient requires daily medical management by a physician with specialized training in physical medicine and rehabilitation for the following reasons: ?Direction of a multidisciplinary physical rehabilitation program to maximize functional independence : Yes ?Medical management of patient stability for increased activity during participation in an intensive rehabilitation regime.: Yes ?Analysis of laboratory values and/or radiology reports with any subsequent need for medication adjustment and/or medical  intervention. : Yes ? ? ?I attest that I was present, lead the team conference, and concur with the assessment and plan of the team. ? ? ?Chana Bode B ?01/16/2022, 4:39 PM  ? ? ? ? ? ? ?

## 2022-01-16 NOTE — Discharge Summary (Incomplete)
Physician Discharge Summary  ?Patient ID: ?Stacey Lucas ?MRN: [id number] ?DOB/AGE: May 18, 1976 46 y.o. ? ?Admit date: 01/02/2022 ?Discharge date: 01/16/2022 ? ?Discharge Diagnoses:  ?Principal Problem: ?  CVA (cerebral vascular accident) (HCC) ?Active Problems: ?  Type 2 diabetes mellitus without complication (HCC) ?  Essential hypertension ?  Hyperlipidemia ?  Thyroid nodule ?  Anemia ?  Acute-on-chronic kidney injury (HCC) ? ? ?Discharged Condition: {condition:18240} ? ?Significant Diagnostic Studies: ?CT HEAD WO CONTRAST ( ) ? ?Result Date: 12/31/2021 ?CLINICAL DATA:  Stroke, follow-up EXAM: CT HEAD WITHOUT CONTRAST TECHNIQUE: Contiguous axial images were obtained from the base of the skull through the vertex without intravenous contrast. RADIATION DOSE REDUCTION: This exam was performed according to the departmental dose-optimization program which includes automated exposure control, adjustment of the mA and/or kV according to patient size and/or use of iterative reconstruction technique. COMPARISON:  12/29/2021 FINDINGS: Brain: Evolving recent infarction involving the left corona radiata and basal ganglia. Chronic bilateral basal ganglia infarcts. Additional patchy hypoattenuation in the supratentorial white matter likely reflects nonspecific gliosis/demyelination. No acute intracranial hemorrhage or mass effect. Ventricles are normal in size. No extra-axial collection. Vascular: There is mild atherosclerotic calcification at the skull base. Skull: Calvarium is unremarkable. Sinuses/Orbits: No acute finding. Other: None. IMPRESSION: Evolving recent infarction of left corona radiata and basal ganglia. No acute intracranial hemorrhage. Chronic infarcts and chronic microvascular ischemic changes. Electronically Signed   By: Guadlupe Spanish M.D.   On: 12/31/2021 10:52  ? ?MR BRAIN WO CONTRAST ? ?Result Date: 12/30/2021 ?CLINICAL DATA:  Provided history: Stroke, follow-up. EXAM: MRI HEAD WITHOUT CONTRAST TECHNIQUE:  Multiplanar, multiecho pulse sequences of the brain and surrounding structures were obtained without intravenous contrast. COMPARISON:  CT angiogram head/neck 12/29/2021. Noncontrast head CT 12/29/2021. Brain MRI 05/23/2021. FINDINGS: Brain: The patient was unable to tolerate the full examination. As a result, axial T1 weighted and coronal T2 TSE sequences could not be obtained. The acquired sequences are intermittently motion degraded. Most notably, there is severe motion degradation of the axial SWI sequence. No age advanced or lobar predominant parenchymal atrophy. 2.4 x 1.1 x 2.0 cm acute infarct within the left corona radiata/basal ganglia. Some residual diffusion weighted hyperintensity at site of a chronic infarct within the right corona radiata. Chronic lacunar infarcts within the bilateral corona radiata/deep gray nuclei. Moderate (and significantly advanced for age) multifocal T2 FLAIR hyperintense signal abnormality elsewhere within the cerebral white matter, nonspecific but likely reflecting chronic small vessel ischemic disease given the patient's history of hypertension, diabetes and hyperlipidemia. Mild chronic small-vessel ischemic changes are also present within the pons No evidence of an intracranial mass. No extra-axial fluid collection. No midline shift. Vascular: Maintained flow voids within the proximal large arterial vessels. Skull and upper cervical spine: No focal suspicious marrow lesion. Sinuses/Orbits: Visualized orbits show no acute finding. Minimal scattered paranasal sinus mucosal thickening. IMPRESSION: Prematurely terminated and motion degraded examination. 2.4 x 1.1 x 2.0 cm acute infarct within the left corona radiata/basal ganglia. Superimposed chronic small-vessel infarcts within the bilateral corona radiata/basal ganglia. Background moderate cerebral white matter chronic small vessel ischemic disease. Mild chronic small vessel ischemic changes are also present within the pons.  Electronically Signed   By: Jackey Loge D.O.   On: 12/30/2021 13:20  ? ?DG CHEST PORT 1 VIEW ? ?Result Date: 12/29/2021 ?CLINICAL DATA:  CVA EXAM: PORTABLE CHEST 1 VIEW COMPARISON:  06/18/2018 chest radiograph FINDINGS: The cardiomediastinal silhouette is unremarkable. There is no evidence of focal airspace disease, pulmonary edema, suspicious pulmonary nodule/mass, pleural  effusion, or pneumothorax. No acute bony abnormalities are identified. IMPRESSION: No active disease. Electronically Signed   By: Harmon Pier M.D.   On: 12/29/2021 16:36  ? ?EEG adult ? ?Result Date: 12/31/2021 ?Charlsie Quest, MD     12/31/2021  2:52 PM Patient Name: Stacey Lucas MRN: [id number] Epilepsy Attending: Charlsie Quest Referring Physician/Provider: Micki Riley, MD Date: 12/31/2021 Duration: 21.53 mins Patient history: 46 year old female with acute left corona radiata/basal ganglia stroke.  EEG evaluate for seizure. Level of alertness: Awake AEDs during EEG study: None Technical aspects: This EEG study was done with scalp electrodes positioned according to the 10-20 International system of electrode placement. Electrical activity was acquired at a sampling rate of 500Hz  and reviewed with a high frequency filter of 70Hz  and a low frequency filter of 1Hz . EEG data were recorded continuously and digitally stored. Description: The posterior dominant rhythm consists of 9 Hz activity of moderate voltage (25-35 uV) seen predominantly in posterior head regions, symmetric and reactive to eye opening and eye closing. Hyperventilation and photic stimulation were not performed.   IMPRESSION: This study is within normal limits. No seizures or epileptiform discharges were seen throughout the recording. Priyanka  ? ?VAS TRANSCRANIAL DOPPLER W BUBBLES ? ?Result Date: 01/01/2022 ? Transcranial Doppler with Bubble Patient Name:  Stacey Lucas  Date of Exam:   12/31/2021 Medical Rec #: 01/03/2022     Accession #:    Stacey Lucas Date of Birth:  04/13/1976    Patient Gender: F Patient Age:   57 years Exam Location:  [facility] Procedure:      VAS 07/08/1976 TRANSCRANIAL DOPPLER W BUBBLES Referring Phys: [id number] --------------------------------------------------------------------------------  Indications: Stroke. Comparison Study: No priors. Performing Technologist: [facility] RDMS, RVT  Examination Guidelines: A complete evaluation includes B-mode imaging, spectral Doppler, color Doppler, and power Doppler as needed of all accessible portions of each vessel. Bilateral testing is considered an integral part of a complete examination. Limited examinations for reoccurring indications may be performed as noted.  Summary: No HITS at rest or during Valsalva. Negative transcranial Doppler Bubble study with no evidence of right to left intracardiac communication.  A vascular evaluation was performed. The right middle cerebral artery was studied. An IV was inserted into the patient's left forearm. Verbal informed consent was obtained.  NEGATIVE TCD Bubble study with no evidence of right to left shunt noted. *See table(s) above for TCD measurements and observations.  Diagnosing physician: [place] MD Electronically signed by Elmer Picker MD on 01/01/2022 at 10:54:57 AM.    Final   ? ?ECHOCARDIOGRAM COMPLETE ? ?Result Date: 12/30/2021 ?   ECHOCARDIOGRAM REPORT   Patient Name:   Stacey Lucas Date of Exam: 12/30/2021 Medical Rec #:  01/01/2022    Height:       65.0 in Accession #:    Stacey Lucas   Weight:       153.0 lb Date of Birth:  Jun 01, 1976   BSA:          1.765 m? Patient Age:    45 years     BP:           154/100 mmHg Patient Gender: F            HR:           78 bpm. Exam Location:  Inpatient Procedure: 2D Echo Indications:    stroke  History:        Patient has prior history of Echocardiogram examinations,  most                 recent 05/23/2021. Arrythmias:Atrial Fibrillation; Risk                 Factors:Diabetes.  Sonographer:    Delcie RochLauren Pennington RDCS  Referring Phys: [id number] ASHISH ARORA IMPRESSIONS  1. Left ventricular ejection fraction, by estimation, is 70 to 75%. Left ventricular ejection fraction by PLAX is 71 %. The left ventricle has hyperdynamic

## 2022-01-16 NOTE — Progress Notes (Signed)
Physical Therapy Session Note ? ?Patient Details  ?Name: Stacey Lucas ?MRN: [id number] ?Date of Birth: [birth year] ? ?Today's Date: 01/16/2022 ?PT Individual Time: 0277-4128 ?PT Individual Time Calculation (min): 40 min  ? ?Short Term Goals: ?Week 1:  PT Short Term Goal 1 (Week 1): Pt will transfer to and from [facility] with min assist consistently ?PT Short Term Goal 1 - Progress (Week 1): Met ?PT Short Term Goal 2 (Week 1): Pt will Ambulate 62f with min assist and LRAD ?PT Short Term Goal 2 - Progress (Week 1): Met ?PT Short Term Goal 3 (Week 1): Pt will propell WC with supervision assist ?PT Short Term Goal 3 - Progress (Week 1): Met ?PT Short Term Goal 4 (Week 1): Pt will improve Berg balance scale by >7 points to demonstrate improved functional movement ?PT Short Term Goal 4 - Progress (Week 1): Met ?Week 2:  PT Short Term Goal 1 (Week 2): STG=LTG due to ELOS ? ? ?Skilled Therapeutic Interventions/Progress Updates:  ? ?Pt received supine in bed and agreeable to PT. Supine>sit transfer with CGA due to poor trunkal rotation initially. [facility].  ? ?WC mobility to force use of RUE x 1056fwith CGA to prevent veer to the R. ? ?Pt performed 5 time sit<>stand (5xSTS): 52 sec (>15 sec indicates increased fall risk). Distant supervision assist from PT for safety.  ? ?Gait training with RW x 7566fnd 100f61fistant supervision assist from PT with cues for AD management in turns and improved posture as tolerated.  ?Dynamic gait training with 1 UE supported on the LUE at rail forward/reverse 2 x 15ft67fh cues for improved step length and height on the RLE.  ?Side stepping R and L with BUE support on rail in hall   ? ?Patient returned to room and performed stand pivot to sit EOB with RW, but no cues or assist from PT. . Pt Marland Kitcheneft sitting EOB with call bell in reach and all needs met.   ? ?   ? ?Therapy Documentation ?Precautions:  ?Precautions ?Precautions: Fall ?Precaution Comments:  R charcot foot, R HP ?Restrictions ?Weight Bearing Restrictions: No ? ?  ?Pain: ?Pain Assessment ?Pain Scale: 0-10 ?Pain Score: 0-No pain ? ? ? ?Therapy/Group: Individual Therapy ? ?AustiLorie Phenix0/2023, 11:56 AM  ?

## 2022-01-16 NOTE — Progress Notes (Signed)
Patient ID: Stacey Lucas, female   DOB: August 29, 1976, 46 y.o.   MRN: [id number] ?Team Conference Report to Patient/Family ? ?Team Conference discussion was reviewed with the patient and caregiver, including goals, any changes in plan of care and target discharge date.  Patient and caregiver express understanding and are in agreement.  The patient has a target discharge date of 01/17/22. ? ?SW met with patient and provided conference updates. Patient medically stable for upcoming discharge. SW will provide patient with PCP list and disability info. No additional questions or concerns. ? ?Dyanne Iha ?01/16/2022, 2:17 PM  ?

## 2022-01-16 NOTE — Plan of Care (Signed)
?  Problem: RH Car Transfers ?Goal: LTG Patient will perform car transfers with assist (PT) ?Description: LTG: Patient will perform car transfers with assistance (PT). ?Outcome: Not Met (add Reason) ?  ?Problem: RH Pre-functional/Other (Specify) ?Goal: RH LTG PT (Specify) 1 ?Description:  RH LTG PT (Specify) 1 ?Outcome: Not Met (add Reason) ?Note: Balance deficits limit progress towards goal ?  ?Problem: RH Balance ?Goal: LTG Patient will maintain dynamic sitting balance (PT) ?Description: LTG:  Patient will maintain dynamic sitting balance with assistance during mobility activities (PT) ?Outcome: Completed/Met ?Goal: LTG Patient will maintain dynamic standing balance (PT) ?Description: LTG:  Patient will maintain dynamic standing balance with assistance during mobility activities (PT) ?Outcome: Completed/Met ?  ?Problem: RH Bed Mobility ?Goal: LTG Patient will perform bed mobility with assist (PT) ?Description: LTG: Patient will perform bed mobility with assistance, with/without cues (PT). ?Outcome: Completed/Met ?  ?Problem: RH Bed to Chair Transfers ?Goal: LTG Patient will perform bed/chair transfers w/assist (PT) ?Description: LTG: Patient will perform bed to chair transfers with assistance (PT). ?Outcome: Completed/Met ?  ?Problem: RH Furniture Transfers ?Goal: LTG Patient will perform furniture transfers w/assist (OT/PT) ?Description: LTG: Patient will perform furniture transfers  with assistance (OT/PT). ?Outcome: Completed/Met ?  ?Problem: RH Ambulation ?Goal: LTG Patient will ambulate in controlled environment (PT) ?Description: LTG: Patient will ambulate in a controlled environment, # of feet with assistance (PT). ?Outcome: Completed/Met ?Goal: LTG Patient will ambulate in home environment (PT) ?Description: LTG: Patient will ambulate in home environment, # of feet with assistance (PT). ?Outcome: Completed/Met ?  ?Problem: RH Wheelchair Mobility ?Goal: LTG Patient will propel w/c in controlled environment  (PT) ?Description: LTG: Patient will propel wheelchair in controlled environment, # of feet with assist (PT) ?Outcome: Completed/Met ?  ?

## 2022-01-16 NOTE — Progress Notes (Signed)
Inpatient Rehabilitation Care Coordinator ?Discharge Note  ? ?Patient Details  ?Name: Stacey Lucas ?MRN: [id number] ?Date of Birth: July 14, 1976 ? ? ?Discharge location: Home ? ?Length of Stay: 15 Days ? ?Discharge activity level: Sup. Cga ? ?Home/community participation: S.O and daughter ? ?Patient response FK:CLEXNT Literacy - How often do you need to have someone help you when you read instructions, pamphlets, or other written material from your doctor or pharmacy?: Never ? ?Patient response ZG:YFVCBS Isolation - How often do you feel lonely or isolated from those around you?: Never ? ?Services provided included: SW, Pharmacy, TR, CM, Neuropsych, RN, SLP, OT, PT, RD, MD ? ?Financial Services:  ?Field seismologist Utilized: Private Insurance ?[employer]BS [place] ? ?Choices offered to/list presented to:   ? ?Follow-up services arranged:  ?Outpatient ?   ?Outpatient Servicies: [facility] ?  ?  ? ?Patient response to transportation need: ?Is the patient able to respond to transportation needs?: Yes ?In the past 12 months, has lack of transportation kept you from medical appointments or from getting medications?: No ?In the past 12 months, has lack of transportation kept you from meetings, work, or from getting things needed for daily living?: No ? ? ? ?Comments (or additional information): ? ?Patient/Family verbalized understanding of follow-up arrangements:  Yes ? ?Individual responsible for coordination of the follow-up plan: self ? ?Confirmed correct DME delivered: Andria Rhein 01/16/2022   ? ?Andria Rhein ?

## 2022-01-16 NOTE — Plan of Care (Signed)
Goals not met due to decline on grad day in setting of new balance deficits in setting of change in medication regime. Pt with episodes of R knee buckling in the morning and requiring CGA steadying assist with all mobility with RW. Needs more assist with LBD and EOB balance, as well due to feeling weaker and ongoing R knee pain.  ? ?Problem: RH Balance ?Goal: LTG Patient will maintain dynamic standing with ADLs (OT) ?Description: LTG:  Patient will maintain dynamic standing balance with assist during activities of daily living (OT)  ?Outcome: Not Met (add Reason) ?  ?Problem: Sit to Stand ?Goal: LTG:  Patient will perform sit to stand in prep for activites of daily living with assistance level (OT) ?Description: LTG:  Patient will perform sit to stand in prep for activites of daily living with assistance level (OT) ?Outcome: Not Met (add Reason) ?  ?Problem: RH Eating ?Goal: LTG Patient will perform eating w/assist, cues/equip (OT) ?Description: LTG: Patient will perform eating with assist, with/without cues using equipment (OT) ?Outcome: Not Met (add Reason) ?  ?Problem: RH Grooming ?Goal: LTG Patient will perform grooming w/assist,cues/equip (OT) ?Description: LTG: Patient will perform grooming with assist, with/without cues using equipment (OT) ?Outcome: Not Met (add Reason) ?  ?Problem: RH Bathing ?Goal: LTG Patient will bathe all body parts with assist levels (OT) ?Description: LTG: Patient will bathe all body parts with assist levels (OT) ?Outcome: Not Met (add Reason) ?  ?Problem: RH Dressing ?Goal: LTG Patient will perform upper body dressing (OT) ?Description: LTG Patient will perform upper body dressing with assist, with/without cues (OT). ?Outcome: Not Met (add Reason) ?Goal: LTG Patient will perform lower body dressing w/assist (OT) ?Description: LTG: Patient will perform lower body dressing with assist, with/without cues in positioning using equipment (OT) ?Outcome: Not Met (add Reason) ?  ?Problem: RH  Toileting ?Goal: LTG Patient will perform toileting task (3/3 steps) with assistance level (OT) ?Description: LTG: Patient will perform toileting task (3/3 steps) with assistance level (OT)  ?Outcome: Not Met (add Reason) ?  ?Problem: RH Simple Meal Prep ?Goal: LTG Patient will perform simple meal prep w/assist (OT) ?Description: LTG: Patient will perform simple meal prep with assistance, with/without cues (OT). ?Outcome: Not Met (add Reason) ?  ?Problem: RH Toilet Transfers ?Goal: LTG Patient will perform toilet transfers w/assist (OT) ?Description: LTG: Patient will perform toilet transfers with assist, with/without cues using equipment (OT) ?Outcome: Not Met (add Reason) ?  ?Problem: RH Tub/Shower Transfers ?Goal: LTG Patient will perform tub/shower transfers w/assist (OT) ?Description: LTG: Patient will perform tub/shower transfers with assist, with/without cues using equipment (OT) ?Outcome: Not Met (add Reason) ?  ?Problem: RH Furniture Transfers ?Goal: LTG Patient will perform furniture transfers w/assist (OT/PT) ?Description: LTG: Patient will perform furniture transfers  with assistance (OT/PT). ?Outcome: Not Met (add Reason) ?  ?Problem: RH Functional Use of Upper Extremity ?Goal: LTG Patient will use RT/LT upper extremity as a (OT) ?Description: LTG: Patient will use right/left upper extremity as a stabilizer/gross assist/diminished/nondominant/dominant level with assist, with/without cues during functional activity (OT) ?Outcome: Completed/Met ?  ?

## 2022-01-16 NOTE — Progress Notes (Signed)
Patient this morning noticed to have balance issues and buckles to R knee. Patient reports that she had same issue in the past when she was taking Gabapentin. Patient will be assessed by MD on next round.  ?

## 2022-01-16 NOTE — Progress Notes (Signed)
Patient ID: Stacey Lucas, female   DOB: Mar 24, 1976, 46 y.o.   MRN: [id number] ? ?Disability and primary care information provided to patient. ?

## 2022-01-17 ENCOUNTER — Other Ambulatory Visit (HOSPITAL_COMMUNITY): Payer: Self-pay

## 2022-01-17 DIAGNOSIS — N179 Acute kidney failure, unspecified: Secondary | ICD-10-CM

## 2022-01-17 DIAGNOSIS — E119 Type 2 diabetes mellitus without complications: Secondary | ICD-10-CM

## 2022-01-17 LAB — GLUCOSE, CAPILLARY: Glucose-Capillary: 133 mg/dL — ABNORMAL HIGH (ref 70–99)

## 2022-01-17 MED ORDER — ROSUVASTATIN CALCIUM 20 MG PO TABS
20.0000 mg | ORAL_TABLET | Freq: Every day | ORAL | 0 refills | Status: DC
Start: 2022-01-17 — End: 2022-02-14
  Filled 2022-01-17: qty 30, 30d supply, fill #0

## 2022-01-17 MED ORDER — CLOPIDOGREL BISULFATE 75 MG PO TABS
75.0000 mg | ORAL_TABLET | Freq: Every day | ORAL | 0 refills | Status: DC
  Filled 2022-01-17: qty 4, 4d supply, fill #0

## 2022-01-17 NOTE — Progress Notes (Signed)
?                                                       PROGRESS NOTE ? ? ?Subjective/Complaints: ? ?DC home today. No new concerns this AM. ? ?ROS: Patient denies CP, SOB, N/V/D, no HA ? ? ?Objective: ?  ?No results found. ? ?No results for input(s): WBC, HGB, HCT, PLT in the last 72 hours. ? ? ?Recent Labs  ?  01/16/22 ?1007  ?NA 139  ?K 4.7  ?CL 109  ?CO2 25  ?GLUCOSE 130*  ?BUN 25*  ?CREATININE 1.94*  ?CALCIUM 8.5*  ? ? ? ? ?Intake/Output Summary (Last 24 hours) at 01/17/2022 0945 ?Last data filed at 01/17/2022 1583 ?Gross per 24 hour  ?Intake 1020 ml  ?Output --  ?Net 1020 ml  ? ?  ? ?  ? ?Physical Exam: ?Vital Signs ?Blood pressure 119/65, pulse 78, temperature 98.1 ?F (36.7 ?C), resp. rate 17, weight 76.4 kg, SpO2 100 %. ? ? ?General: No acute distress. Sitting in bed ?Mood and affect are appropriate. pleasant ?Heart: Regular rate and rhythm no rubs murmurs or extra sounds ?Lungs: Clear to auscultation, breathing unlabored, no rales or wheezes ?Abdomen: Positive bowel sounds, soft nontender to palpation, nondistended ?Extremities: No clubbing, cyanosis, or edema ? ?Skin: No evidence of breakdown, no evidence of rash ?Neurologic: Dysarthria Cranial nerves II through XII grossly intact, motor strength is 3/5 in Right and 4- Left  deltoid, bicep, tricep, grip, hip flexor, knee extensors, ankle dorsiflexor and plantar flexor ?Sensory exam normal for light touch and pain in all 4 limbs. No limb ataxia or cerebellar signs. No abnormal tone appreciated.   ?Musculoskeletal: Full range of motion in all 4 extremities. Right knee mild medial tenderness no swelling or effusion. ? ? ?Assessment/Plan: ?1. Functional deficits which require 3+ hours per day of interdisciplinary therapy in a comprehensive inpatient rehab setting. ?Physiatrist is providing close team supervision and 24 hour management of active medical problems listed below. ?Physiatrist and rehab team continue to assess barriers to discharge/monitor patient  progress toward functional and medical goals ? ?Care Tool: ? ?Bathing ?   ?Body parts bathed by patient: Right arm, Left arm, Chest, Abdomen, Face, Left lower leg, Right lower leg, Right upper leg, Left upper leg, Buttocks, Front perineal area  ?   ?  ?  ?Bathing assist Assist Level: Contact Guard/Touching assist ?  ?  ?Upper Body Dressing/Undressing ?Upper body dressing   ?What is the patient wearing?: Pull over shirt ?   ?Upper body assist Assist Level: Set up assist ?   ?Lower Body Dressing/Undressing ?Lower body dressing ? ? ?   ?What is the patient wearing?: Underwear/pull up, Pants ? ?  ? ?Lower body assist Assist for lower body dressing: Contact Guard/Touching assist ?   ? ?Toileting ?Toileting Toileting Activity did not occur (Acupuncturist and hygiene only): N/A (no void or bm)  ?Toileting assist Assist for toileting: Contact Guard/Touching assist ?  ?  ?Transfers ?Chair/bed transfer ? ?Transfers assist ?   ? ?Chair/bed transfer assist level: Independent with assistive device ?  ?  ?Locomotion ?Ambulation ? ? ?Ambulation assist ? ?   ? ?Assist level: Independent with assistive device ?Assistive device:  (rollator and foot up brace L) ?Max distance: 150  ? ?Walk 10 feet activity ? ? ?  Assist ?   ? ?Assist level: Independent with assistive device ?Assistive device: Walker-rolling  ? ?Walk 50 feet activity ? ? ?Assist Walk 50 feet with 2 turns activity did not occur: Safety/medical concerns ? ?Assist level: Independent with assistive device ?Assistive device: Orthosis, Rollator  ? ? ?Walk 150 feet activity ? ? ?Assist Walk 150 feet activity did not occur: Safety/medical concerns ? ?Assist level: Independent with assistive device ?Assistive device: Rollator, Orthosis ?  ? ?Walk 10 feet on uneven surface  ?activity ? ? ?Assist Walk 10 feet on uneven surfaces activity did not occur: Safety/medical concerns ? ? ?Assist level: Supervision/Verbal cueing ?Assistive device: Walker-rolling   ? ?Wheelchair ? ? ? ? ?Assist Is the patient using a wheelchair?: Yes ?Type of Wheelchair: Manual ?  ? ?Wheelchair assist level: Supervision/Verbal cueing ?Max wheelchair distance: 150  ? ? ?Wheelchair 50 feet with 2 turns activity ? ? ? ?Assist ? ?  ?  ? ? ?Assist Level: Supervision/Verbal cueing  ? ?Wheelchair 150 feet activity  ? ? ? ?Assist ?   ? ? ?Assist Level: Supervision/Verbal cueing  ? ?Blood pressure 119/65, pulse 78, temperature 98.1 ?F (36.7 ?C), resp. rate 17, weight 76.4 kg, SpO2 100 %. ? ?Medical Problem List and Plan: ?1. 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            -patient may shower ?            -ELOS/Goals: 5/11 d/c date- discussed smoking and cocaine cessation given they are major risk factors for recurrent CVA  ?         -Discharge home today continue outpatient therapies including PT, OT  ? ?2.  Antithrombotics: ?-DVT/anticoagulation:  Pharmaceutical: Lovenox ?            -antiplatelet therapy: DAPT X 3 weeks followed by ASA alone on 5/13 ?3. Pain Management: tylenol prn ? -added voltaren gel to RIGHT knee with some benefit. ?4. Mood: LCSW to follow for evaluation and support.  ?            -antipsychotic agents: N/a ?5. Neuropsych: This patient is capable of making decisions on her own behalf. ?6. Skin/Wound Care: routine pressure relief measures.  ?7. Fluids/Electrolytes/Nutrition: Monitor I/O.   ? ?  Latest Ref Rng & Units 01/16/2022  ? 10:07 AM 01/14/2022  ?  6:26 AM 01/07/2022  ?  5:51 AM  ?BMP  ?Glucose 70 - 99 mg/dL 366   440   347    ?BUN 6 - 20 mg/dL 25   23   22     ?Creatinine 0.44 - 1.00 mg/dL   4.25   9.56    ?Sodium 135 - 145 mmol/L 139   142   140    ?Potassium 3.5 - 5.1 mmol/L 4.7   4.0   3.9    ?Chloride 98 - 111 mmol/L 109   110   112    ?CO2 22 - 32 mmol/L 25   25   24     ?Calcium 8.9 - 10.3 mg/dL 8.5   8.3   8.0    ? ?8. HTN: Monitor BP TID--continue Avapro and metoprolol ?            ?Vitals:  ? 01/16/22 2028  01/17/22 0557  ?BP: 120/68 119/65  ?Pulse: 71 78  ?Resp: 17 17  ?Temp: 97.9 ?F (36.6 ?C) 98.1 ?F (36.7 ?C)  ?SpO2: 100% 100%  ?Well controlled on 5/11,  continue avapro and metoprolol  ?9.T2DM: Hgb A1C- 7.1 trending down as compliant with diet but not meds.  ?            --monitor BS ac/hs and use SSI for elevated BS ?            --BS variable from 74 to 235.  ?--Per [facility]--no meds picked up since Oct/Rx for basiglar and ozempic not picked up yet.  ?-- low dose Amaryl due to CKD.   ?- Overall stable CBG, Discussed importance of healthy diet for DM management ?CBG (last 3)  ?Recent Labs  ?  01/16/22 ?1613 01/16/22 ?2210 01/17/22 ?0604  ?GLUCAP 196* 98 133*  ?5/9 elevated in p.m.  Will resume Ozempic and Basaglar at home post discharge ?10. History of depression/anxiety: Continue Celexa. Buproprion added for smoking cessation  ?11. Acute on chronic renal failure: SCr 1.33 at admission-->1.66 ?            -5/1 Cr 1.42 ? -Encouraged oral fluid intake, recheck as outpatient ?12. Insomnia: Secondary to anxiety. Trazodone prn added. ?--monitor for now.  ?13. Overweight: BMI 25.72: provide dietary education ?  ?14.  Anemia no obvious blood loss, Negative stool  guaic  ? ?  Latest Ref Rng & Units 01/14/2022  ?  6:26 AM 01/07/2022  ?  5:51 AM 01/03/2022  ?  6:02 AM  ?CBC  ?WBC 4.0 - 10.5 K/uL 7.9   9.9   9.0    ?Hemoglobin 12.0 - 15.0 g/dL 9.5   9.6   9.6    ?Hematocrit 36.0 - 46.0 % 28.8   28.7   29.0    ?Platelets 150 - 400 K/uL 259   256   202    ?15.  Hx of tobacco abuse start bupropion 150mg  per day , plavix may boost effect , monitor for HA  ?Started Gabapentin 400mg  BID for cocaine cessation but this makes pt feel weak, d/c ?16.  Right knee buckling no pain no swelling, no change in manual muscle test, will ask OT to do some standing will see if this recurs, consider CT of the head if this is not a one-time event LOS: ?15 days ?A FACE TO FACE EVALUATION WAS PERFORMED ? ?Stacey Lucas ?01/17/2022, 9:45 AM  ? ?  ?

## 2022-01-17 NOTE — Discharge Summary (Signed)
Physician Discharge Summary  ?Patient ID: ?Stacey Lucas ?MRN: [id number] ?DOB/AGE: [birth year] 46 y.o. ? ?Admit date: 01/02/2022 ?Discharge date: 01/17/2022 ? ?Discharge Diagnoses:  ?Principal Problem: ?  CVA (cerebral vascular accident) (HCC) ?Active Problems: ?  Type 2 diabetes mellitus without complication (HCC) ?  Essential hypertension ?  Hyperlipidemia ?  Thyroid nodule ?  Anemia ?  Acute-on-chronic kidney injury (HCC) ? ? ?Discharged Condition: good ? ?Significant Diagnostic Studies: N/A ? ?Labs:  ?Basic Metabolic Panel: ? ?  Latest Ref Rng & Units 01/16/2022  ? 10:07 AM 01/14/2022  ?  6:26 AM 01/07/2022  ?  5:51 AM  ?BMP  ?Glucose 70 - 99 mg/dL 423   536   144    ?BUN 6 - 20 mg/dL 25   23   22     ?Creatinine 0.44 - 1.00 mg/dL   3.15   4.00    ?Sodium 135 - 145 mmol/L 139   142   140    ?Potassium 3.5 - 5.1 mmol/L 4.7   4.0   3.9    ?Chloride 98 - 111 mmol/L 109   110   112    ?CO2 22 - 32 mmol/L 25   25   24     ?Calcium 8.9 - 10.3 mg/dL 8.5   8.3   8.0    ?  ? ?CBC: ? ?  Latest Ref Rng & Units 01/14/2022  ?  6:26 AM 01/07/2022  ?  5:51 AM 01/03/2022  ?  6:02 AM  ?CBC  ?WBC 4.0 - 10.5 K/uL 7.9   9.9   9.0    ?Hemoglobin 12.0 - 15.0 g/dL 9.5   9.6   9.6    ?Hematocrit 36.0 - 46.0 % 28.8   28.7   29.0    ?Platelets 150 - 400 K/uL 259   256   202    ?  ? ?CBG: ?Recent Labs  ?Lab 01/16/22 ?01/05/2022 01/16/22 ?1156 01/16/22 ?1613 01/16/22 ?2210 01/17/22 ?0604  ?GLUCAP 115* 148* 196* 98 133*  ? ? ?Brief HPI:   Stacey Lucas is a 46 y.o. female with history of uncontrolled T2DM with neuropathy/RLE weakness, CVA 09/22, thyroid nodule, depression, ongoing cocaine abuse who was admitted on 12/29/2021 with left-sided weakness, left facial weakness and slurred speech which was transient.  She then developed near flaccidity of R UE, RLE greater than LLE drift, slurred speech and right facial droop while in ED.  UDS was positive for cocaine.  CTA head neck was negative for LVO.  Blood pressure noted to be elevated and was treated with IV  labetalol and an Cleviprex.  She received TNK and follow-up MRI brain showed acute infarct in left corona radiata, basal ganglia and chronic small vessel disease.  2D echo showed EF of 70 to 75% with mild LVH.   ? ?Follow-up TEE was negative for intra-atrial shunt, thrombus and negative bubble study.  EEG was negative for seizures.  Hospital course was significant for acute on chronic renal failure with rise in serum creatinine to 1.66.  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 10/22 recommended DAPT x3 weeks followed by aspirin alone.  She continues to be limited by right-sided weakness with left inattention, right lean as well as right knee instability with standing attempts.  CIR was recommended due to functional decline. ? ? ?Hospital Course: Stacey Lucas was admitted to rehab 01/02/2022 for inpatient therapies to consist of PT, ST and OT at least three hours five  days a week. Past admission physiatrist, therapy team and rehab RN have worked together to provide customized collaborative inpatient rehab.  Her blood pressures were monitored on TID basis and have been well controlled.  Serial check of electrolytes showed acute on chronic chronic renal failure therefore Avapro was just decreased to 75 amlodipine was added and titrated to 10 mg/day.  She has had recurrent rise in his serum creatinine was advised to increase p.o. fluids recommendations to have repeat labs monitoring of renal status.  Has been stable and she has shown good participation with therapy.  She was started on bupropion to help with anxiety as well as quit smoking.  Gabapentin for $00 mg twice daily was added to assist with cocaine cessation however patient was unable to tolerate this due to extreme fatigue/balance issues therefore this was discontinued ? ?Follow-up CBC showed H&H to be stable without signs of bleeding.  She was maintained on DAPT during her stay after 3 days.  Her diabetes has been monitored with ac/hs  CBG checks and SSI was use prn for tighter BS control.  Low-dose Amaryl was added due to CKD in addition to insulin glargine 5 units at bedtime.  Her p.o. intake has been good and blood sugars are relatively controlled but will need further adjustment on outpatient basis. Patient and fiancee have been educated on effects of tobacco, marijuana/cocaine on brain and it's stroke risk as well as importance of medical/medication compliance. She has been set up with Dr. Laural BenesJohnson for primary care follow up.  She has made good gains during her rehab stay and requires intermittent supervision for mobility.  She will continue to receive follow-up outpatient PT and OT [facility] after discharge. ? ? ?Rehab course: During patient's stay in rehab weekly team conferences were held to monitor patient's progress, set goals and discuss barriers to discharge. At admission, patient required min to mod assist with ADL tasks and mod assist with mobility. She exhibited mild cognitive impairments with acute mild dysarthria and deficits.  She  has had improvement in activity tolerance, balance, postural control as well as ability to compensate for deficits.  She has had improvement in functional use RUE  and RLE as well as improvement in awareness. She is able to complete ADL tasks with CGA to min assist. She is modified independent for transfers and to ambulate 150' with RW.  She requires CGA to climb 12 stairs. Speech is intelligible and she is able to complete cognitive tasks with repetition as well as extra time for processing. Family education was completed.  ?  ?Discharge disposition: 01-Home or Self Care ? ?Diet: Heart healthy/diabetic.   ? ?Special Instructions: ?No driving or strenuous activity till cleared by MD. ?Increase fluid intake.  ?Recommend repeat BMET in 1-2 weeks to monitor renal status. ?4.  Recommend thyroid ultrasound to follow-up on thyroid nodule seen on CTA brain. ? ? ?Discharge Instructions   ? ?  Ambulatory referral to Neurology   Complete by: As directed ?  ? An appointment is requested in approximately: 4-6 weels  ? Ambulatory referral to Physical Medicine Rehab   Complete by: As directed ?  ? TC follow up with Dr. Natale LayShtridelman  ? ?  ? ?Allergies as of 01/17/2022   ? ?   Reactions  ? Gabapentin   ? Weakness/impairs balance  ? ?  ? ?  ?Medication List  ?  ? ?STOP taking these medications   ? ?insulin aspart 100 UNIT/ML injection ?Commonly known  as: novoLOG ?  ?valsartan 160 MG tablet ?Commonly known as: DIOVAN ?Replaced by: irbesartan 75 MG tablet ?  ? ?  ? ?TAKE these medications   ? ?amLODipine 10 MG tablet ?Commonly known as: NORVASC ?Take 1 tablet (10 mg total) by mouth daily. ?  ?Aspirin Low Dose 81 MG EC tablet ?Generic drug: aspirin ?Take 1 tablet (81 mg total) by mouth daily. Swallow whole. ?  ?azelastine 0.1 % nasal spray ?Commonly known as: ASTELIN ?Place 1-2 sprays into both nostrils 2 (two) times daily as needed for allergies. ?  ?Basaglar KwikPen 100 UNIT/ML ?Inject 5 Units into the skin daily. **Discard after 28 days** ?Notes to patient: DAILY AT BEDTIME ?  ?BD Pen Needle Nano U/F 32G X 4 MM Misc ?Generic drug: Insulin Pen Needle ?Use to inject insulin at bed time ?  ?buPROPion 150 MG 24 hr tablet ?Commonly known as: WELLBUTRIN XL ?Take 1 tablet (150 mg total) by mouth daily. ?  ?citalopram 10 MG tablet ?Commonly known as: CELEXA ?Take 1 tablet (10 mg total) by mouth daily. ?  ?clopidogrel 75 MG tablet ?Commonly known as: PLAVIX ?Take 1 tablet (75 mg total) by mouth daily. ?What changed: Another medication with the same name was added. Make sure you understand how and when to take each. ?  ?clopidogrel 75 MG tablet ?Commonly known as: PLAVIX ?Take 1 tablet (75 mg total) by mouth daily. ?What changed: You were already taking a medication with the same name, and this prescription was added. Make sure you understand how and when to take each. ?  ?diclofenac Sodium 1 % Gel ?Commonly known as:  VOLTAREN ?Apply 4 g topically daily as needed (For knee pain). ?  ?glimepiride 2 MG tablet ?Commonly known as: AMARYL ?Take 1 tablet (2 mg total) by mouth daily with breakfast. ?  ?irbesartan 75 MG tablet ?Commonl

## 2022-01-17 NOTE — Progress Notes (Signed)
discharge instructions/medications reviewed by provided from Reesa Chew, [place] ?Patient accompanied my significant other.  ?Staff assisted patient off the unit, patient discharged via private care; no issues. ?

## 2022-01-31 ENCOUNTER — Encounter: Payer: Self-pay | Admitting: Physical Medicine & Rehabilitation

## 2022-01-31 ENCOUNTER — Encounter
Payer: BC Managed Care – PPO | Attending: Physical Medicine & Rehabilitation | Admitting: Physical Medicine & Rehabilitation

## 2022-01-31 VITALS — BP 138/83 | HR 61 | Ht 65.0 in | Wt 138.6 lb

## 2022-01-31 DIAGNOSIS — G8929 Other chronic pain: Secondary | ICD-10-CM | POA: Diagnosis present

## 2022-01-31 DIAGNOSIS — I639 Cerebral infarction, unspecified: Secondary | ICD-10-CM | POA: Diagnosis present

## 2022-01-31 DIAGNOSIS — F1910 Other psychoactive substance abuse, uncomplicated: Secondary | ICD-10-CM | POA: Diagnosis not present

## 2022-01-31 DIAGNOSIS — I10 Essential (primary) hypertension: Secondary | ICD-10-CM | POA: Diagnosis not present

## 2022-01-31 DIAGNOSIS — E1169 Type 2 diabetes mellitus with other specified complication: Secondary | ICD-10-CM | POA: Diagnosis not present

## 2022-01-31 DIAGNOSIS — M25561 Pain in right knee: Secondary | ICD-10-CM | POA: Diagnosis present

## 2022-01-31 MED ORDER — METOPROLOL SUCCINATE ER 25 MG PO TB24: 75.0000 mg | ORAL_TABLET | Freq: Every day | ORAL | 0 refills | Status: DC

## 2022-01-31 MED ORDER — BLOOD GLUCOSE MONITOR KIT: PACK | 0 refills | Status: DC

## 2022-01-31 NOTE — Progress Notes (Signed)
Subjective:    Patient ID: Stacey Lucas, female    DOB: [birth year], 46 y.o.   MRN: [id number] Hospital HPI Brief HPI:   Stacey Lucas is a 46 y.o. female with history of uncontrolled T2DM with neuropathy/RLE weakness, CVA 09/22, thyroid nodule, depression, ongoing cocaine abuse who was admitted on 12/29/2021 with left-sided weakness, left facial weakness and slurred speech which was transient.  She then developed near flaccidity of R UE, RLE greater than LLE drift, slurred speech and right facial droop while in ED.  UDS was positive for cocaine.  CTA head neck was negative for LVO.  Blood pressure noted to be elevated and was treated with IV labetalol and an Cleviprex.  She received TNK and follow-up MRI brain showed acute infarct in left corona radiata, basal ganglia and chronic small vessel disease.  2D echo showed EF of 70 to 75% with mild LVH.     Follow-up TEE was negative for intra-atrial shunt, thrombus and negative bubble study.  EEG was negative for seizures.  Hospital course was significant for acute on chronic renal failure with rise in serum creatinine to 1.66.  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 Pearlean Brownie recommended DAPT x3 weeks followed by aspirin alone.  She continues to be limited by right-sided weakness with left inattention, right lean as well as right knee instability with standing attempts.  CIR was recommended due to functional decline.     Hospital Course: Stacey Lucas was admitted to rehab 01/02/2022 for inpatient therapies to consist of PT, ST and OT at least three hours five days a week. Past admission physiatrist, therapy team and rehab RN have worked together to provide customized collaborative inpatient rehab.  Her blood pressures were monitored on TID basis and have been well controlled.  Serial check of electrolytes showed acute on chronic chronic renal failure therefore Avapro was just decreased to 75 amlodipine was added and titrated to 10  mg/day.  She has had recurrent rise in his serum creatinine was advised to increase p.o. fluids recommendations to have repeat labs monitoring of renal status.  Has been stable and she has shown good participation with therapy.  She was started on bupropion to help with anxiety as well as quit smoking.  Gabapentin for $00 mg twice daily was added to assist with cocaine cessation however patient was unable to tolerate this due to extreme fatigue/balance issues therefore this was discontinued   Follow-up CBC showed H&H to be stable without signs of bleeding.  She was maintained on DAPT during her stay after 3 days.  Her diabetes has been monitored with ac/hs CBG checks and SSI was use prn for tighter BS control.  Low-dose Amaryl was added due to CKD in addition to insulin glargine 5 units at bedtime.  Her p.o. intake has been good and blood sugars are relatively controlled but will need further adjustment on outpatient basis. Patient and fiancee have been educated on effects of tobacco, marijuana/cocaine on brain and it's stroke risk as well as importance of medical/medication compliance. She has been set up with Dr. Laural Benes for primary care follow up.  She has made good gains during her rehab stay and requires intermittent supervision for mobility.  She will continue to receive follow-up outpatient PT and OT at [facility] neuro rehab after discharge.     Rehab course: During patient's stay in rehab weekly team conferences were held to monitor patient's progress, set goals and discuss barriers to discharge. At admission,  patient required min to mod assist with ADL tasks and mod assist with mobility. She exhibited mild cognitive impairments with acute mild dysarthria and deficits.  She  has had improvement in activity tolerance, balance, postural control as well as ability to compensate for deficits.  She has had improvement in functional use RUE  and RLE as well as improvement in awareness. She is able to complete ADL  tasks with CGA to min assist. She is modified independent for transfers and to ambulate 150' with RW.  She requires CGA to climb 12 stairs. Speech is intelligible and she is able to complete cognitive tasks with repetition as well as extra time for processing. Family education was completed.      HPI  46 year old female with past medical history of uncontrolled type 2 diabetes with neuropathy, history of CVA 922, cocaine abuse, tobacco abuse, depression who was admitted for acute infarct of the left corona radiata, basal ganglia.  Patient reports she is doing well overall since her discharge from CIR.  She has not used cocaine tobacco or any other substances.  She reports outpatient therapy will begin in a few weeks.  Reports pain is well controlled.  She is low on metoprolol and would like a refill until she sees her primary care physician.  She has a follow-up with her PCP in a couple weeks.  No concerns with bowel or bladder function.  Reports blood pressure is well controlled at home.  She does have chronic pain in her right knee that is worse with ambulation.  Pain is at the anterior portion of her knee.  She is able to use a walker for ambulation.  She reports her glucometer is broken and needs a new one.  She also requests a shower bench because she is having trouble getting into the shower.  Pain Inventory Average Pain 3 Pain Right Now 3 My pain is aching  LOCATION OF PAIN  knee  BOWEL Number of stools per week: 2   BLADDER Normal  Mobility walk with assistance use a walker ability to climb steps?  no do you drive?  no  Function disabled: date disabled . I need assistance with the following:  feeding, dressing, bathing, toileting, meal prep, household duties, and shopping  Neuro/Psych trouble walking confusion  Prior Studies Any changes since last visit?  no  Physicians involved in your care Any changes since last visit?  no   Family History  Problem Relation Age  of Onset   Diabetes Mother    Hypertension Mother    Arthritis Mother    Diabetes Father    Hypertension Father    Social History   Socioeconomic History   Marital status: Single    Spouse name: Not on file   Number of children: 3   Years of education: Not on file   Highest education level: Not on file  Occupational History   Not on file  Tobacco Use   Smoking status: Every Day    Packs/day: 1.00    Types: Cigarettes   Smokeless tobacco: Never  Vaping Use   Vaping Use: Never used  Substance and Sexual Activity   Alcohol use: Yes    Comment: occasionally   Drug use: No   Sexual activity: Yes    Birth control/protection: None  Other Topics Concern   Not on file  Social History Narrative   Lives with children   "lots of caffeine"   Social Determinants of Health   Financial Resource Strain:  Not on file  Food Insecurity: Not on file  Transportation Needs: Not on file  Physical Activity: Not on file  Stress: Not on file  Social Connections: Not on file   Past Surgical History:  Procedure Laterality Date   BUBBLE STUDY  01/01/2022   Procedure: BUBBLE STUDY;  Surgeon: Maisie FusBranch, Mary E, MD;  [facility];  Service: Cardiovascular;;   TEE WITHOUT CARDIOVERSION N/A 01/01/2022   Procedure: TRANSESOPHAGEAL ECHOCARDIOGRAM (TEE);  Surgeon: Maisie FusBranch, Mary E, MD;  [facility];  Service: Cardiovascular;  Laterality: N/A;   tubal     Past Medical History:  Diagnosis Date   Anxiety and depression    Cocaine abuse (HCC)    Depression with suicidal ideation    [facility] admission 04/2018   Diabetes mellitus without complication (HCC)    Type II   GERD (gastroesophageal reflux disease)    Hyperlipidemia    Hypertension    Impingement syndrome of right shoulder    Migraine headache    Osteoarthritis of right knee 05/24/2021   Pilonidal abscess 05/2013   Pyelonephritis    Right thyroid nodule    Sciatica    BP 138/83   Pulse 61   Ht 5\' 5"  (1.651 m)   Wt 138  lb 9.6 oz (62.9 kg)   BMI 23.06 kg/m   Opioid Risk Score:   Fall Risk Score:  `1  [facility]     01/31/2022   10:09 AM 11/28/2017    9:59 AM 11/27/2017    3:51 PM  Depression screen PHQ 2/9  Decreased Interest 0 0 0  Down, Depressed, Hopeless 0 0 0  PHQ - 2 Score 0 0 0  Altered sleeping 0    Tired, decreased energy 1    Change in appetite 0    Feeling bad or failure about yourself  0    Trouble concentrating 0    Moving slowly or fidgety/restless 0    Suicidal thoughts 0    PHQ-9 Score 1       Review of Systems  Constitutional: Negative.   HENT: Negative.    Eyes: Negative.   Respiratory: Negative.    Cardiovascular: Negative.   Gastrointestinal: Negative.   Endocrine: Negative.   Genitourinary: Negative.   Musculoskeletal:  Positive for gait problem.  Skin: Negative.   Allergic/Immunologic: Negative.   Hematological:  Bruises/bleeds easily.       Plavix  Psychiatric/Behavioral:  Positive for confusion.   All other systems reviewed and are negative.     Objective:   Physical Exam Today's Vitals   01/31/22 1007  BP: 138/83  Pulse: 61  Weight: 62.9 kg  Height: 5\' 5"  (1.651 m)   Body mass index is 23.06 kg/m.    General: Alert and oriented x 3, No apparent distress HEENT: Head is normocephalic, atraumatic, PERRLA, EOMI, sclera anicteric, oral mucosa pink and moist, dentition intact, ext ear canals clear,  Neck: Supple without JVD or lymphadenopathy Heart: Reg rate and rhythm. No murmurs rubs or gallops Chest: CTA bilaterally without wheezes, rales, or rhonchi; no distress Abdomen: Soft, non-tender, non-distended, bowel sounds positive. Extremities: No clubbing, cyanosis, or edema. Pulses are 2+ Psych: Pt's affect is appropriate. Pt is cooperative Skin: Clean and intact without signs of breakdown Neuro: Cranial nerves II through XII intact.  Dysarthria noted.  Sensation intact to light touch in all 4 limbs.  Finger-nose normal   musculoskeletal:  Tone appears to be normal No joint swelling noted Full range of  motion in all 4 extremities Left upper extremity and left lower extremity 5 out of 5 Right upper extremity strength 4+ out of 5 Right lower extremity 4- out of 5 Right knee with tenderness around the patella no pain with with varus or valgus stress McMurray's negative.  No pain with hip internal and external rotation.  R Knee xray 03/13/21  "FINDINGS: No evidence of fracture, dislocation, or joint effusion. No evidence of arthropathy or other focal bone abnormality. Soft tissues are unremarkable.  "    Assessment & Plan:  1. Functional deficits secondary to left corona radiata infarct. Cocaine related vasculopathy             -Continue outpatient PT/OT  -Order placed for shower bench  -Home health aid requested, order placed but she does not qualify at this time due to lack of skilled home need    2. HTN well-controlled today -continue Avapro and metoprolol -Metoprolol was refilled, follow-up with primary care physician   3. T2DM -Continue current medications, glucometer was ordered   4.  Polysubstance use with history of tobacco abuse , cocaine use -Continue buproprion, congratulated her on not smoking tobacco or using other illicit substances  5.  Right knee pain -Suspect patellofemoral syndrome, Pt to work on this with PT,  Can use knee brace and continue Voltaren gel

## 2022-02-02 ENCOUNTER — Encounter: Payer: Self-pay | Admitting: Physical Medicine & Rehabilitation

## 2022-02-05 NOTE — Therapy (Incomplete)
OUTPATIENT OCCUPATIONAL THERAPY NEURO EVALUATION  Patient Name: Stacey Lucas MRN: [id number] DOB:04-13-1976, 46 y.o., female Today's Date: 02/05/2022  PCP:  Dr. Maryelizabeth Rowan REFERRING PROVIDER: Charlton Amor, PA-C     Past Medical History:  Diagnosis Date   Anxiety and depression    Cocaine abuse [facility])    Depression with suicidal ideation    [facility] admission 04/2018   Diabetes mellitus without complication (HCC)    Type II   GERD (gastroesophageal reflux disease)    Hyperlipidemia    Hypertension    Impingement syndrome of right shoulder    Migraine headache    Osteoarthritis of right knee 05/24/2021   Pilonidal abscess 05/2013   Pyelonephritis    Right thyroid nodule    Sciatica    Past Surgical History:  Procedure Laterality Date   BUBBLE STUDY  01/01/2022   Procedure: BUBBLE STUDY;  Surgeon: Maisie Fus, MD;  Location: [facility] ENDOSCOPY;  Service: Cardiovascular;;   TEE WITHOUT CARDIOVERSION N/A 01/01/2022   Procedure: TRANSESOPHAGEAL ECHOCARDIOGRAM (TEE);  Surgeon: Maisie Fus, MD;  Location: [facility] ENDOSCOPY;  Service: Cardiovascular;  Laterality: N/A;   tubal     Patient Active Problem List   Diagnosis Date Noted   Anemia 01/16/2022   Acute-on-chronic kidney injury (HCC) 01/16/2022   CVA (cerebral vascular accident) (HCC) 01/02/2022   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 12/29/2021   No-show for appointment 07/06/2021   Atrial fibrillation (HCC) 07/04/2021   H/O: stroke 07/04/2021   Acute CVA (cerebrovascular accident) (HCC) 05/25/2021   Cerebral thrombosis with cerebral infarction 05/24/2021   Osteoarthritis of right knee 05/24/2021   Type 2 diabetes mellitus without complication (HCC) 05/23/2021   Acute left-sided weakness 05/23/2021   Essential hypertension 05/23/2021   Hyperlipidemia 05/23/2021   Thyroid nodule 05/23/2021   MDD (major depressive disorder), recurrent severe, without  psychosis (HCC) 04/27/2018   MDD (major depressive disorder), severe (HCC) 04/25/2018    ONSET DATE: 12/29/21  REFERRING DIAG: I63.9 (ICD-10-CM) - Cerebral infarction, unspecified   THERAPY DIAG:  No diagnosis found.  Rationale for Evaluation and Treatment Rehabilitation  SUBJECTIVE:   SUBJECTIVE STATEMENT: *** Pt accompanied by: {accompnied:27141}  PERTINENT HISTORY:   s/p CVA, admitted on 12/29/2021 with left-sided weakness, left facial weakness and slurred speech which was transient.  She then developed near flaccidity of R UE, RLE greater than LLE drift, slurred speech and right facial droop while in ED.  UDS was positive for cocaine.   PMH includes:  uncontrolled T2DM with neuropathy/RLE weakness, CVA 09/22, thyroid nodule, depression, ongoing cocaine abuse   PRECAUTIONS: {Therapy precautions:24002}  WEIGHT BEARING RESTRICTIONS {Yes ***/No:24003}  PAIN:  Are you having pain? {OPRCPAIN:27236}  FALLS: Has patient fallen in last 6 months? {fallsyesno:27318}  LIVING ENVIRONMENT: Lives with: {OPRC lives with:25569::"lives with their family"} Lives in: {Lives in:25570} Stairs: {opstairs:27293} Has following equipment at home: {Assistive devices:23999}  PLOF: {PLOF:24004}  PATIENT GOALS ***  OBJECTIVE:   HAND DOMINANCE: {MISC; OT HAND DOMINANCE:[id number]}  ADLs: Overall ADLs: *** Transfers/ambulation related to ADLs: Eating: *** Grooming: *** UB Dressing: *** LB Dressing: *** Toileting: *** Bathing: *** Tub Shower transfers: *** Equipment: {equipment:25573}   IADLs: Shopping: *** Light housekeeping: *** Meal Prep: *** Community mobility: *** Medication management: *** Financial management: *** Handwriting: {OTWRITTENEXPRESSION:25361}  MOBILITY STATUS: {OTMOBILITY:25360}  POSTURE COMMENTS:  {posture:25561} Sitting balance: {sitting balance:25483}  ACTIVITY TOLERANCE: Activity tolerance: ***  FUNCTIONAL OUTCOME  MEASURES: {OTFUNCTIONALMEASURES:27238}  UPPER EXTREMITY ROM     {  AROM/PROM:27142} ROM Right eval Left eval  Shoulder flexion    Shoulder abduction    Shoulder adduction    Shoulder extension    Shoulder internal rotation    Shoulder external rotation    Elbow flexion    Elbow extension    Wrist flexion    Wrist extension    Wrist ulnar deviation    Wrist radial deviation    Wrist pronation    Wrist supination    (Blank rows = not tested)   UPPER EXTREMITY MMT:     MMT Right eval Left eval  Shoulder flexion    Shoulder abduction    Shoulder adduction    Shoulder extension    Shoulder internal rotation    Shoulder external rotation    Middle trapezius    Lower trapezius    Elbow flexion    Elbow extension    Wrist flexion    Wrist extension    Wrist ulnar deviation    Wrist radial deviation    Wrist pronation    Wrist supination    (Blank rows = not tested)  HAND FUNCTION: {handfunction:27230}  COORDINATION: {otcoordination:27237}  SENSATION: {sensation:27233}  EDEMA: ***  MUSCLE TONE: {UETONE:25567}  COGNITION: Overall cognitive status: {cognition:24006}  VISION: Subjective report: *** Baseline vision: {OTBASELINEVISION:25363} Visual history: {OTVISUALHISTORY:25364}  VISION ASSESSMENT: {visionassessment:27231}  Patient has difficulty with following activities due to following visual impairments: ***  PERCEPTION: {Perception:25564}  PRAXIS: {Praxis:25565}  OBSERVATIONS: ***   TODAY'S TREATMENT:  ***   PATIENT EDUCATION: Education details: *** Person educated: {Person educated:25204} Education method: {Education Method:25205} Education comprehension: {Education Comprehension:25206}   HOME EXERCISE PROGRAM: ***    GOALS: Goals reviewed with patient? {yes/no:20286}  SHORT TERM GOALS: Target date: ***  *** Baseline: Goal status: {GOALSTATUS:25110}  2.  *** Baseline:  Goal status: {GOALSTATUS:25110}  3.   *** Baseline:  Goal status: {GOALSTATUS:25110}  4.  *** Baseline:  Goal status: {GOALSTATUS:25110}  5.  *** Baseline:  Goal status: {GOALSTATUS:25110}  6.  *** Baseline:  Goal status: {GOALSTATUS:25110}  LONG TERM GOALS: Target date: ***  *** Baseline:  Goal status: {GOALSTATUS:25110}  2.  *** Baseline:  Goal status: {GOALSTATUS:25110}  3.  *** Baseline:  Goal status: {GOALSTATUS:25110}  4.  *** Baseline:  Goal status: {GOALSTATUS:25110}  5.  *** Baseline:  Goal status: {GOALSTATUS:25110}  6.  *** Baseline:  Goal status: {GOALSTATUS:25110}  ASSESSMENT:  CLINICAL IMPRESSION: Patient is a *** y.o. *** who was seen today for occupational therapy evaluation for ***.   PERFORMANCE DEFICITS in functional skills including {OT physical skills:25468}, cognitive skills including {OT cognitive skills:25469}, and psychosocial skills including {OT psychosocial skills:25470}.   IMPAIRMENTS are limiting patient from {OT performance deficits:25471}.   COMORBIDITIES {Comorbidities:25485} that affects occupational performance. Patient will benefit from skilled OT to address above impairments and improve overall function.  MODIFICATION OR ASSISTANCE TO COMPLETE EVALUATION: {OT modification:25474}  OT OCCUPATIONAL PROFILE AND HISTORY: {OT PROFILE AND HISTORY:25484}  CLINICAL DECISION MAKING: {OT CDM:25475}  REHAB POTENTIAL: {rehabpotential:25112}  EVALUATION COMPLEXITY: {Evaluation complexity:25115}    PLAN: OT FREQUENCY: {rehab frequency:25116}  OT DURATION: {rehab duration:25117}  PLANNED INTERVENTIONS: {OT Interventions:25467}  RECOMMENDED OTHER SERVICES: ***  CONSULTED AND AGREED WITH PLAN OF CARE: {SPQ:33007}  PLAN FOR NEXT SESSION: ***   Willa Frater, OT 02/05/2022, 10:47 AM   ***

## 2022-02-07 ENCOUNTER — Ambulatory Visit: Payer: BC Managed Care – PPO

## 2022-02-07 ENCOUNTER — Ambulatory Visit: Payer: BC Managed Care – PPO | Attending: Family Medicine | Admitting: Occupational Therapy

## 2022-02-07 ENCOUNTER — Other Ambulatory Visit (HOSPITAL_COMMUNITY): Payer: Self-pay

## 2022-02-07 ENCOUNTER — Telehealth (HOSPITAL_COMMUNITY): Payer: Self-pay

## 2022-02-07 NOTE — Therapy (Incomplete)
OUTPATIENT PHYSICAL THERAPY NEURO EVALUATION   Patient Name: Stacey Lucas MRN: [id number] DOB:07-Jun-1976, 46 y.o., female Today's Date: 02/07/2022   PCP: Marland Kitchen REFERRING PROVIDER: Charlton Amor, PA-C     Past Medical History:  Diagnosis Date   Anxiety and depression    Cocaine abuse (HCC)    Depression with suicidal ideation    [facility] admission 04/2018   Diabetes mellitus without complication (HCC)    Type II   GERD (gastroesophageal reflux disease)    Hyperlipidemia    Hypertension    Impingement syndrome of right shoulder    Migraine headache    Osteoarthritis of right knee 05/24/2021   Pilonidal abscess 05/2013   Pyelonephritis    Right thyroid nodule    Sciatica    Past Surgical History:  Procedure Laterality Date   BUBBLE STUDY  01/01/2022   Procedure: BUBBLE STUDY;  Surgeon: Maisie Fus, MD;  Location: [facility] ENDOSCOPY;  Service: Cardiovascular;;   TEE WITHOUT CARDIOVERSION N/A 01/01/2022   Procedure: TRANSESOPHAGEAL ECHOCARDIOGRAM (TEE);  Surgeon: Maisie Fus, MD;  Location: [facility] ENDOSCOPY;  Service: Cardiovascular;  Laterality: N/A;   tubal     Patient Active Problem List   Diagnosis Date Noted   Anemia 01/16/2022   Acute-on-chronic kidney injury (HCC) 01/16/2022   CVA (cerebral vascular accident) (HCC) 01/02/2022   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 12/29/2021   No-show for appointment 07/06/2021   Atrial fibrillation (HCC) 07/04/2021   H/O: stroke 07/04/2021   Acute CVA (cerebrovascular accident) (HCC) 05/25/2021   Cerebral thrombosis with cerebral infarction 05/24/2021   Osteoarthritis of right knee 05/24/2021   Type 2 diabetes mellitus without complication (HCC) 05/23/2021   Acute left-sided weakness 05/23/2021   Essential hypertension 05/23/2021   Hyperlipidemia 05/23/2021   Thyroid nodule 05/23/2021   MDD (major depressive disorder), recurrent severe, without psychosis (HCC)  04/27/2018   MDD (major depressive disorder), severe (HCC) 04/25/2018    ONSET DATE: 12/29/21  REFERRING DIAG: I63.9 (ICD-10-CM) - Cerebral infarction, unspecified  THERAPY DIAG:  No diagnosis found.  Rationale for Evaluation and Treatment Rehabilitation  SUBJECTIVE:                                                                                                                                                                                              SUBJECTIVE STATEMENT: *** Pt accompanied by: {accompnied:27141}  PERTINENT HISTORY: right corona radiata stroke 2022 with residual balance deficits & cognitive deficits, DM, HTN, hyperlipidemia, migraines, Charcot in RLE with residual weakness  PAIN:  Are you having pain? {OPRCPAIN:27236}  PRECAUTIONS: {Therapy  precautions:24002}  WEIGHT BEARING RESTRICTIONS {Yes ***/No:24003}  FALLS: Has patient fallen in last 6 months? {fallsyesno:27318}  LIVING ENVIRONMENT: Lives with: {OPRC lives with:25569::"lives with their family"} Lives in: {Lives in:25570} Stairs: {opstairs:27293} Has following equipment at home: {Assistive devices:23999}  PLOF: {PLOF:24004}  PATIENT GOALS ***  OBJECTIVE:   DIAGNOSTIC FINDINGS: ***  COGNITION: Overall cognitive status: History of cognitive impairments - at baseline   SENSATION: {sensation:27233}  COORDINATION: ***  EDEMA:  {edema:24020}  MUSCLE TONE: {LE tone:25568}   MUSCLE LENGTH: Hamstrings: Right *** deg; Left *** deg Thomas test: Right *** deg; Left *** deg  DTRs:  {DTR SITE:24025}  POSTURE: {posture:25561}  LOWER EXTREMITY ROM:     {AROM/PROM:27142}  Right Eval Left Eval  Hip flexion    Hip extension    Hip abduction    Hip adduction    Hip internal rotation    Hip external rotation    Knee flexion    Knee extension    Ankle dorsiflexion    Ankle plantarflexion    Ankle inversion    Ankle eversion     (Blank rows = not tested)  LOWER EXTREMITY MMT:     MMT Right Eval Left Eval  Hip flexion    Hip extension    Hip abduction    Hip adduction    Hip internal rotation    Hip external rotation    Knee flexion    Knee extension    Ankle dorsiflexion    Ankle plantarflexion    Ankle inversion    Ankle eversion    (Blank rows = not tested)  BED MOBILITY:  {Bed mobility:24027}  TRANSFERS: Assistive device utilized: {Assistive devices:23999}  Sit to stand: {Levels of assistance:24026} Stand to sit: {Levels of assistance:24026} Chair to chair: {Levels of assistance:24026} Floor: {Levels of assistance:24026}  RAMP:  Level of Assistance: {Levels of assistance:24026} Assistive device utilized: {Assistive devices:23999} Ramp Comments: ***  CURB:  Level of Assistance: {Levels of assistance:24026} Assistive device utilized: {Assistive devices:23999} Curb Comments: ***  STAIRS:  Level of Assistance: {Levels of assistance:24026}  Stair Negotiation Technique: {Stair Technique:27161} with {Rail Assistance:27162}  Number of Stairs: ***   Height of Stairs: ***  Comments: ***  GAIT: Gait pattern: {gait characteristics:25376} Distance walked: *** Assistive device utilized: {Assistive devices:23999} Level of assistance: {Levels of assistance:24026} Comments: ***  FUNCTIONAL TESTs:  {Functional tests:24029}  PATIENT SURVEYS:  {rehab surveys:24030}  TODAY'S TREATMENT:  ***   PATIENT EDUCATION: Education details: *** Person educated: {Person educated:25204} Education method: {Education Method:25205} Education comprehension: {Education Comprehension:25206}   HOME EXERCISE PROGRAM: ***    GOALS: Goals reviewed with patient? {yes/no:20286}  SHORT TERM GOALS: Target date: {follow up:25551}  *** Baseline: Goal status: {GOALSTATUS:25110}  2.  *** Baseline:  Goal status: {GOALSTATUS:25110}  3.  *** Baseline:  Goal status: {GOALSTATUS:25110}  4.  *** Baseline:  Goal status: {GOALSTATUS:25110}  5.   *** Baseline:  Goal status: {GOALSTATUS:25110}  6.  *** Baseline:  Goal status: {GOALSTATUS:25110}  LONG TERM GOALS: Target date: {follow up:25551}  *** Baseline:  Goal status: {GOALSTATUS:25110}  2.  *** Baseline:  Goal status: {GOALSTATUS:25110}  3.  *** Baseline:  Goal status: {GOALSTATUS:25110}  4.  *** Baseline:  Goal status: {GOALSTATUS:25110}  5.  *** Baseline:  Goal status: {GOALSTATUS:25110}  6.  *** Baseline:  Goal status: {GOALSTATUS:25110}  ASSESSMENT:  CLINICAL IMPRESSION: Patient is a 46 y.o. female who was seen today for physical therapy evaluation and treatment for ***.    OBJECTIVE IMPAIRMENTS {opptimpairments:25111}.  ACTIVITY LIMITATIONS {activitylimitations:27494}  PARTICIPATION LIMITATIONS: {participationrestrictions:25113}  PERSONAL FACTORS {Personal factors:25162} are also affecting patient's functional outcome.   REHAB POTENTIAL: {rehabpotential:25112}  CLINICAL DECISION MAKING: {clinical decision making:25114}  EVALUATION COMPLEXITY: {Evaluation complexity:25115}  PLAN: PT FREQUENCY: {rehab frequency:25116}  PT DURATION: {rehab duration:25117}  PLANNED INTERVENTIONS: {rehab planned interventions:25118::"Therapeutic exercises","Therapeutic activity","Neuromuscular re-education","Balance training","Gait training","Patient/Family education","Joint mobilization"}  PLAN FOR NEXT SESSION: ***   Westley FootsJennifer A Joanann Mies, PT Westley FootsJennifer A Payeton Germani, PT, [name], CBIS  02/07/2022, 7:54 AM

## 2022-02-07 NOTE — Telephone Encounter (Signed)
Transitions of Care Pharmacy   Call attempted for a pharmacy transitions of care follow-up.   Call attempt #1. Will follow-up in 2-3 days.   Darcus Austin, Allison Craig 02/07/2022 12:06 PM

## 2022-02-12 ENCOUNTER — Telehealth: Payer: Self-pay

## 2022-02-12 ENCOUNTER — Other Ambulatory Visit (HOSPITAL_COMMUNITY): Payer: Self-pay

## 2022-02-13 ENCOUNTER — Other Ambulatory Visit (HOSPITAL_COMMUNITY): Payer: Self-pay

## 2022-02-13 ENCOUNTER — Telehealth (HOSPITAL_COMMUNITY): Payer: Self-pay | Admitting: Pharmacist

## 2022-02-14 ENCOUNTER — Other Ambulatory Visit (HOSPITAL_COMMUNITY): Payer: Self-pay

## 2022-02-14 ENCOUNTER — Telehealth (HOSPITAL_COMMUNITY): Payer: Self-pay

## 2022-02-14 MED ORDER — PANTOPRAZOLE SODIUM 40 MG PO TBEC: 40.0000 mg | DELAYED_RELEASE_TABLET | Freq: Every day | ORAL | 0 refills | Status: DC

## 2022-02-14 MED ORDER — ROSUVASTATIN CALCIUM 20 MG PO TABS: 20.0000 mg | ORAL_TABLET | Freq: Every day | ORAL | 0 refills | Status: DC

## 2022-02-14 MED ORDER — CITALOPRAM HYDROBROMIDE 10 MG PO TABS: 10.0000 mg | ORAL_TABLET | Freq: Every day | ORAL | 0 refills | Status: DC

## 2022-02-14 MED ORDER — GLIMEPIRIDE 2 MG PO TABS: 2.0000 mg | ORAL_TABLET | Freq: Every day | ORAL | 0 refills | Status: DC

## 2022-02-14 MED ORDER — BUPROPION HCL ER (XL) 150 MG PO TB24: 150.0000 mg | ORAL_TABLET | Freq: Every day | ORAL | 0 refills | Status: DC

## 2022-02-14 MED ORDER — CLOPIDOGREL BISULFATE 75 MG PO TABS: 75.0000 mg | ORAL_TABLET | Freq: Every day | ORAL | 0 refills | Status: DC

## 2022-02-14 MED ORDER — IRBESARTAN 75 MG PO TABS: 75.0000 mg | ORAL_TABLET | Freq: Every day | ORAL | 0 refills | Status: DC

## 2022-02-14 NOTE — Telephone Encounter (Signed)
Pharmacy Transitions of Care Follow-up Telephone Call  Date of discharge: 01/17/22  Discharge Diagnosis: CVA  How have you been since you were released from the hospital? Patient doing well since discharge, no questions about meds at this time.   Medication changes made at discharge:     START taking: amLODipine (NORVASC)  Basaglar KwikPen  BD Pen Needle Nano U/F (Insulin Pen Needle)  buPROPion (WELLBUTRIN XL)  glimepiride (AMARYL)  irbesartan (AVAPRO)  This replaces a similar medication. See the full medication list for instructions. pantoprazole (PROTONIX)  CHANGE how you take: clopidogrel (PLAVIX)  STOP taking: insulin aspart 100 UNIT/ML injection (novoLOG)  metoprolol succinate 25 MG 24 hr tablet (TOPROL-XL)  valsartan 160 MG tablet (DIOVAN)  Replaced by a similar medication.  Medication changes verified by the patient? Yes    Medication Accessibility:  Home Pharmacy: [facility]   Was the patient provided with refills on discharged medications? Yes  on 3 meds. Patient is calling MD office for refills on remaining meds. They have already had follow up.  Have all prescriptions been transferred from [facility] to home pharmacy? Yes   Is the patient able to afford medications? Has insurance    Medication Review:  CLOPIDOGREL (PLAVIX) Clopidogrel 75 mg once daily.  - Educated patient on expected duration of therapy of ASA with clopidogrel. - Advised patient of medications to avoid (NSAIDs, ASA)  - Educated that Tylenol (acetaminophen) will be the preferred analgesic to prevent risk of bleeding  - Emphasized importance of monitoring for signs and symptoms of bleeding (abnormal bruising, prolonged bleeding, nose bleeds, bleeding from gums, discolored urine, black tarry stools)  - Advised patient to alert all providers of anticoagulation therapy prior to starting a new medication or having a procedure    Follow-up Appointments:  PCP Hospital f/u appt confirmed? Yes  Scheduled to see PCP  Specialist Hospital f/u appt confirmed? Patient has already had follow up with Dr. Natale Lay  If their condition worsens, is the pt aware to call PCP or go to the Emergency Dept.? Yes  Final Patient Assessment: Patient has follow up scheduled and knows to get refills from home pharmacy

## 2022-02-14 NOTE — Telephone Encounter (Signed)
Stacey Lucas called needing her medications refilled because she does not see her NEW PCP until next week and will be out of meds. Courtesy one month supply sent to pharmacy with future refills to be managed by PCP.

## 2022-02-19 ENCOUNTER — Inpatient Hospital Stay: Payer: BC Managed Care – PPO | Admitting: Internal Medicine

## 2022-02-21 ENCOUNTER — Ambulatory Visit: Payer: BC Managed Care – PPO

## 2022-02-21 ENCOUNTER — Encounter: Payer: BC Managed Care – PPO | Admitting: Occupational Therapy

## 2022-02-28 ENCOUNTER — Other Ambulatory Visit: Payer: Self-pay | Admitting: Physical Medicine & Rehabilitation

## 2022-02-28 ENCOUNTER — Ambulatory Visit: Payer: Self-pay | Admitting: *Deleted

## 2022-02-28 ENCOUNTER — Telehealth: Payer: Self-pay | Admitting: *Deleted

## 2022-02-28 MED ORDER — AMLODIPINE BESYLATE 10 MG PO TABS: 10.0000 mg | ORAL_TABLET | Freq: Every day | ORAL | 0 refills | Status: DC

## 2022-02-28 NOTE — Telephone Encounter (Signed)
Summary: Dizziness   Patient states she was recently was discharged from te hospital after suffering from a stroke. Patient states she does not have any more bp medication and is dizzy w/ a headache   Patients hfu is scheduled for 6-27   Please follow up w/ patient     Called pt and LM on VM to call back to discuss. Called Dr. Chauncy Passy office- pt is no longer a pt there effective 12/17/21.

## 2022-02-28 NOTE — Telephone Encounter (Signed)
Summary: Dizziness   Patient states she was recently was discharged from te hospital after suffering from a stroke. Patient states she does not have any more bp medication and is dizzy w/ a headache   Patients hfu is scheduled for 6-27   Please follow up w/ patient       Called patient to review sx dizziness/ HA and medication requests. No answer, LVMTCB to [facility] where future visit scheduled [phone].

## 2022-02-28 NOTE — Telephone Encounter (Signed)
Ms Mo's fiance showed up in the office stating she does not have appt with PCP until 03/05/22 and they did not get her amlodipine refill and the plavix. I have reviewed chart and the refill on the plavix was sent in but only 4 tablets (she had 2 separate rx from [employer] [facility]). I have refilled the amlodipine 30 d x1 and the plavix 30 d x1 with no further refills from our office. She no showed  her scheduled appt with PCP 02/19/22 and it has been rescheduled to 03/05/22 with Shan Levans MD. She has not seen Dr Pearlean Brownie and no appt scheduled. I reviewed the referral and it looks like the [facility] office has been unable to reach the patient to set the appt. I reprinted the AVS from the inpatient discharge and highlighted [facility] address and phone # for them to call when he gets home!!! (she was on the phone speaker while he was here). I also highlighted the PCP appt and reinforced they cannot miss that appointment for we can not refill her meds again. They both verbalized understanding.

## 2022-02-28 NOTE — Telephone Encounter (Signed)
3rd attempt to contact patient regarding sx of dizziness and headache. Patient has not established care with  [facility] at this time. Left message to go to ED for sx and medication. Transitions of Care pharmacy attempting to contact patient and assist with medications.

## 2022-02-28 NOTE — Telephone Encounter (Signed)
Per epic medication has been refilled by another provider. Pt has upcoming appointment with Dr. Delford Field.

## 2022-03-04 ENCOUNTER — Ambulatory Visit: Payer: BC Managed Care – PPO | Admitting: Physical Therapy

## 2022-03-04 ENCOUNTER — Ambulatory Visit: Payer: BC Managed Care – PPO | Admitting: Occupational Therapy

## 2022-03-04 NOTE — Therapy (Deleted)
OUTPATIENT OCCUPATIONAL THERAPY NEURO EVALUATION  Patient Name: Stacey Lucas MRN: [id number] DOB:12/23/1975, 46 y.o., female Today's Date: 03/04/2022  PCP:Dr. Duanne Guess REFERRING PROVIDER: Colonel Bald PA-C    Past Medical History:  Diagnosis Date   Anxiety and depression    Cocaine abuse (HCC)    Depression with suicidal ideation    [facility] admission 04/2018   Diabetes mellitus without complication (HCC)    Type II   GERD (gastroesophageal reflux disease)    Hyperlipidemia    Hypertension    Impingement syndrome of right shoulder    Migraine headache    Osteoarthritis of right knee 05/24/2021   Pilonidal abscess 05/2013   Pyelonephritis    Right thyroid nodule    Sciatica    Past Surgical History:  Procedure Laterality Date   BUBBLE STUDY  01/01/2022   Procedure: BUBBLE STUDY;  Surgeon: Maisie Fus, MD;  Location: [facility] ENDOSCOPY;  Service: Cardiovascular;;   TEE WITHOUT CARDIOVERSION N/A 01/01/2022   Procedure: TRANSESOPHAGEAL ECHOCARDIOGRAM (TEE);  Surgeon: Maisie Fus, MD;  Location: [facility] ENDOSCOPY;  Service: Cardiovascular;  Laterality: N/A;   tubal     Patient Active Problem List   Diagnosis Date Noted   Anemia 01/16/2022   Acute-on-chronic kidney injury (HCC) 01/16/2022   CVA (cerebral vascular accident) (HCC) 01/02/2022   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 12/29/2021   No-show for appointment 07/06/2021   Atrial fibrillation (HCC) 07/04/2021   H/O: stroke 07/04/2021   Acute CVA (cerebrovascular accident) (HCC) 05/25/2021   Cerebral thrombosis with cerebral infarction 05/24/2021   Osteoarthritis of right knee 05/24/2021   Type 2 diabetes mellitus without complication (HCC) 05/23/2021   Acute left-sided weakness 05/23/2021   Essential hypertension 05/23/2021   Hyperlipidemia 05/23/2021   Thyroid nodule 05/23/2021   MDD (major depressive disorder), recurrent severe, without psychosis (HCC)  04/27/2018   MDD (major depressive disorder), severe (HCC) 04/25/2018    ONSET DATE: 12/29/21  REFERRING DIAG: I63.9 (ICD-10-CM) - Cerebral infarction, unspecified  THERAPY DIAG:  No diagnosis found.  Rationale for Evaluation and Treatment {HABREHAB:27488}  SUBJECTIVE:   SUBJECTIVE STATEMENT: *** Pt accompanied by: {accompnied:27141}  PERTINENT HISTORY: ***  PRECAUTIONS: {Therapy precautions:24002}  WEIGHT BEARING RESTRICTIONS {Yes ***/No:24003}  PAIN:  Are you having pain? {OPRCPAIN:27236}  FALLS: Has patient fallen in last 6 months? {fallsyesno:27318}  LIVING ENVIRONMENT: Lives with: {OPRC lives with:25569::"lives with their family"} Lives in: {Lives in:25570} Stairs: {opstairs:27293} Has following equipment at home: {Assistive devices:23999}  PLOF: {PLOF:24004}  PATIENT GOALS ***  OBJECTIVE:   HAND DOMINANCE: {MISC; OT HAND DOMINANCE:[phone]}  ADLs: Overall ADLs: *** Transfers/ambulation related to ADLs: Eating: *** Grooming: *** UB Dressing: *** LB Dressing: *** Toileting: *** Bathing: *** Tub Shower transfers: *** Equipment: {equipment:25573}   IADLs: Shopping: *** Light housekeeping: *** Meal Prep: *** Community mobility: *** Medication management: *** Financial management: *** Handwriting: {OTWRITTENEXPRESSION:25361}  MOBILITY STATUS: {OTMOBILITY:25360}  POSTURE COMMENTS:  {posture:25561} Sitting balance: {sitting balance:25483}  ACTIVITY TOLERANCE: Activity tolerance: ***  FUNCTIONAL OUTCOME MEASURES: {OTFUNCTIONALMEASURES:27238}  UPPER EXTREMITY ROM     {AROM/PROM:27142} ROM Right eval Left eval  Shoulder flexion    Shoulder abduction    Shoulder adduction    Shoulder extension    Shoulder internal rotation    Shoulder external rotation    Elbow flexion    Elbow extension    Wrist flexion    Wrist extension    Wrist ulnar deviation    Wrist radial deviation  Wrist pronation    Wrist supination    (Blank rows  = not tested)   UPPER EXTREMITY MMT:     MMT Right eval Left eval  Shoulder flexion    Shoulder abduction    Shoulder adduction    Shoulder extension    Shoulder internal rotation    Shoulder external rotation    Middle trapezius    Lower trapezius    Elbow flexion    Elbow extension    Wrist flexion    Wrist extension    Wrist ulnar deviation    Wrist radial deviation    Wrist pronation    Wrist supination    (Blank rows = not tested)  HAND FUNCTION: {handfunction:27230}  COORDINATION: {otcoordination:27237}  SENSATION: {sensation:27233}  EDEMA: ***  MUSCLE TONE: {UETONE:25567}  COGNITION: Overall cognitive status: {cognition:24006}  VISION: Subjective report: *** Baseline vision: {OTBASELINEVISION:25363} Visual history: {OTVISUALHISTORY:25364}  VISION ASSESSMENT: {visionassessment:27231}  Patient has difficulty with following activities due to following visual impairments: ***  PERCEPTION: {Perception:25564}  PRAXIS: {Praxis:25565}  OBSERVATIONS: ***   TODAY'S TREATMENT:  ***   PATIENT EDUCATION: Education details: *** Person educated: {Person educated:25204} Education method: {Education Method:25205} Education comprehension: {Education Comprehension:25206}   HOME EXERCISE PROGRAM: ***    GOALS: Goals reviewed with patient? {yes/no:20286}  SHORT TERM GOALS: Target date: ***  *** Baseline: Goal status: {GOALSTATUS:25110}  2.  *** Baseline:  Goal status: {GOALSTATUS:25110}  3.  *** Baseline:  Goal status: {GOALSTATUS:25110}  4.  *** Baseline:  Goal status: {GOALSTATUS:25110}  5.  *** Baseline:  Goal status: {GOALSTATUS:25110}  6.  *** Baseline:  Goal status: {GOALSTATUS:25110}  LONG TERM GOALS: Target date: ***  *** Baseline:  Goal status: {GOALSTATUS:25110}  2.  *** Baseline:  Goal status: {GOALSTATUS:25110}  3.  *** Baseline:  Goal status: {GOALSTATUS:25110}  4.  *** Baseline:  Goal status:  {GOALSTATUS:25110}  5.  *** Baseline:  Goal status: {GOALSTATUS:25110}  6.  *** Baseline:  Goal status: {GOALSTATUS:25110}  ASSESSMENT:  CLINICAL IMPRESSION: Patient is a *** y.o. *** who was seen today for occupational therapy evaluation for ***.   PERFORMANCE DEFICITS in functional skills including {OT physical skills:25468}, cognitive skills including {OT cognitive skills:25469}, and psychosocial skills including {OT psychosocial skills:25470}.   IMPAIRMENTS are limiting patient from {OT performance deficits:25471}.   COMORBIDITIES {Comorbidities:25485} that affects occupational performance. Patient will benefit from skilled OT to address above impairments and improve overall function.  MODIFICATION OR ASSISTANCE TO COMPLETE EVALUATION: {OT modification:25474}  OT OCCUPATIONAL PROFILE AND HISTORY: {OT PROFILE AND HISTORY:25484}  CLINICAL DECISION MAKING: {OT CDM:25475}  REHAB POTENTIAL: {rehabpotential:25112}  EVALUATION COMPLEXITY: {Evaluation complexity:25115}    PLAN: OT FREQUENCY: {rehab frequency:25116}  OT DURATION: {rehab duration:25117}  PLANNED INTERVENTIONS: {OT Interventions:25467}  RECOMMENDED OTHER SERVICES: ***  CONSULTED AND AGREED WITH PLAN OF CARE: {ZOX:09604}  PLAN FOR NEXT SESSION: ***   Keene Breath, OT 03/04/2022, 8:36 AM

## 2022-03-05 ENCOUNTER — Encounter: Payer: Self-pay | Admitting: Critical Care Medicine

## 2022-03-05 ENCOUNTER — Telehealth: Payer: Self-pay

## 2022-03-05 ENCOUNTER — Ambulatory Visit: Payer: BC Managed Care – PPO | Attending: Critical Care Medicine | Admitting: Critical Care Medicine

## 2022-03-05 VITALS — BP 113/74 | HR 95

## 2022-03-05 DIAGNOSIS — E041 Nontoxic single thyroid nodule: Secondary | ICD-10-CM

## 2022-03-05 DIAGNOSIS — F332 Major depressive disorder, recurrent severe without psychotic features: Secondary | ICD-10-CM

## 2022-03-05 DIAGNOSIS — I10 Essential (primary) hypertension: Secondary | ICD-10-CM

## 2022-03-05 DIAGNOSIS — E782 Mixed hyperlipidemia: Secondary | ICD-10-CM | POA: Diagnosis not present

## 2022-03-05 DIAGNOSIS — E119 Type 2 diabetes mellitus without complications: Secondary | ICD-10-CM | POA: Diagnosis not present

## 2022-03-05 DIAGNOSIS — D649 Anemia, unspecified: Secondary | ICD-10-CM

## 2022-03-05 DIAGNOSIS — I639 Cerebral infarction, unspecified: Secondary | ICD-10-CM

## 2022-03-05 DIAGNOSIS — I6300 Cerebral infarction due to thrombosis of unspecified precerebral artery: Secondary | ICD-10-CM

## 2022-03-05 DIAGNOSIS — N189 Chronic kidney disease, unspecified: Secondary | ICD-10-CM

## 2022-03-05 DIAGNOSIS — Z1159 Encounter for screening for other viral diseases: Secondary | ICD-10-CM

## 2022-03-05 DIAGNOSIS — Z1211 Encounter for screening for malignant neoplasm of colon: Secondary | ICD-10-CM

## 2022-03-05 DIAGNOSIS — M1711 Unilateral primary osteoarthritis, right knee: Secondary | ICD-10-CM

## 2022-03-05 DIAGNOSIS — I4891 Unspecified atrial fibrillation: Secondary | ICD-10-CM

## 2022-03-05 DIAGNOSIS — N179 Acute kidney failure, unspecified: Secondary | ICD-10-CM

## 2022-03-05 DIAGNOSIS — I6330 Cerebral infarction due to thrombosis of unspecified cerebral artery: Secondary | ICD-10-CM

## 2022-03-05 LAB — GLUCOSE, POCT (MANUAL RESULT ENTRY): POC Glucose: 230 mg/dl — AB (ref 70–99)

## 2022-03-05 MED ORDER — GLIMEPIRIDE 2 MG PO TABS: 2.0000 mg | ORAL_TABLET | Freq: Every day | ORAL | 2 refills | Status: DC

## 2022-03-05 MED ORDER — ROSUVASTATIN CALCIUM 20 MG PO TABS: 20.0000 mg | ORAL_TABLET | Freq: Every day | ORAL | 2 refills | Status: DC

## 2022-03-05 MED ORDER — MELOXICAM 15 MG PO TABS
15.0000 mg | ORAL_TABLET | Freq: Every day | ORAL | 1 refills | Status: DC
Start: 2022-03-05 — End: 2022-04-18

## 2022-03-05 MED ORDER — METOPROLOL SUCCINATE ER 25 MG PO TB24: 75.0000 mg | ORAL_TABLET | Freq: Every day | ORAL | 1 refills | Status: DC

## 2022-03-05 MED ORDER — CITALOPRAM HYDROBROMIDE 10 MG PO TABS: 10.0000 mg | ORAL_TABLET | Freq: Every day | ORAL | 3 refills | Status: DC

## 2022-03-05 MED ORDER — IRBESARTAN 75 MG PO TABS: 75.0000 mg | ORAL_TABLET | Freq: Every day | ORAL | 2 refills | Status: DC

## 2022-03-05 MED ORDER — AMLODIPINE BESYLATE 10 MG PO TABS: 10.0000 mg | ORAL_TABLET | Freq: Every day | ORAL | 2 refills | Status: DC

## 2022-03-05 NOTE — Assessment & Plan Note (Signed)
Continue citalopram discontinue Wellbutrin

## 2022-03-05 NOTE — Assessment & Plan Note (Signed)
Trial of meloxicam and discontinue further Voltaren gel

## 2022-03-05 NOTE — Assessment & Plan Note (Signed)
Check CBC 

## 2022-03-05 NOTE — Assessment & Plan Note (Addendum)
History of acute stroke with residual deficit will need neurology follow-up  Patient will continue aspirin no longer needs Plavix  Continue Crestor and follow-up lipids  Work with case management to get personal care services

## 2022-03-06 LAB — LIPID PANEL
Chol/HDL Ratio: 2.9 ratio (ref 0.0–4.4)
Cholesterol, Total: 218 mg/dL — ABNORMAL HIGH (ref 100–199)
HDL: 76 mg/dL (ref >39)
LDL Chol Calc (NIH): 120 mg/dL — ABNORMAL HIGH (ref 0–99)
Triglycerides: 129 mg/dL (ref 0–149)
VLDL Cholesterol Cal: 22 mg/dL (ref 5–40)

## 2022-03-06 LAB — COMPREHENSIVE METABOLIC PANEL
ALT: 19 IU/L (ref 0–32)
AST: 25 IU/L (ref 0–40)
Albumin/Globulin Ratio: 1.2 (ref 1.2–2.2)
Albumin: 2.9 g/dL — ABNORMAL LOW (ref 3.8–4.8)
Alkaline Phosphatase: 121 IU/L (ref 44–121)
BUN/Creatinine Ratio: 13 (ref 9–23)
BUN: 21 mg/dL (ref 6–24)
Bilirubin Total: <0.2 mg/dL (ref 0.0–1.2)
CO2: 20 mmol/L (ref 20–29)
Calcium: 8.7 mg/dL (ref 8.7–10.2)
Chloride: 108 mmol/L — ABNORMAL HIGH (ref 96–106)
Creatinine, Ser: 1.68 mg/dL — ABNORMAL HIGH (ref 0.57–1.00)
Globulin, Total: 2.4 g/dL (ref 1.5–4.5)
Glucose: 204 mg/dL — ABNORMAL HIGH (ref 70–99)
Potassium: 4.1 mmol/L (ref 3.5–5.2)
Sodium: 143 mmol/L (ref 134–144)
Total Protein: 5.3 g/dL — ABNORMAL LOW (ref 6.0–8.5)
eGFR: 38 mL/min/{1.73_m2} — ABNORMAL LOW (ref >59)

## 2022-03-06 LAB — CBC WITH DIFFERENTIAL/PLATELET
Basophils Absolute: 0 10*3/uL (ref 0.0–0.2)
Basos: 0 %
EOS (ABSOLUTE): 0.3 10*3/uL (ref 0.0–0.4)
Eos: 3 %
Hematocrit: 32.6 % — ABNORMAL LOW (ref 34.0–46.6)
Hemoglobin: 10.9 g/dL — ABNORMAL LOW (ref 11.1–15.9)
Immature Grans (Abs): 0.1 10*3/uL (ref 0.0–0.1)
Immature Granulocytes: 1 %
Lymphocytes Absolute: 3 10*3/uL (ref 0.7–3.1)
Lymphs: 28 %
MCH: 25.6 pg — ABNORMAL LOW (ref 26.6–33.0)
MCHC: 33.4 g/dL (ref 31.5–35.7)
MCV: 77 fL — ABNORMAL LOW (ref 79–97)
Monocytes Absolute: 0.8 10*3/uL (ref 0.1–0.9)
Monocytes: 7 %
Neutrophils Absolute: 6.7 10*3/uL (ref 1.4–7.0)
Neutrophils: 61 %
Platelets: 287 10*3/uL (ref 150–450)
RBC: 4.25 x10E6/uL (ref 3.77–5.28)
RDW: 14 % (ref 11.7–15.4)
WBC: 10.9 10*3/uL — ABNORMAL HIGH (ref 3.4–10.8)

## 2022-03-06 LAB — HCV AB W REFLEX TO QUANT PCR: HCV Ab: NONREACTIVE

## 2022-03-06 LAB — HCV INTERPRETATION

## 2022-03-06 LAB — HEMOGLOBIN A1C
Est. average glucose Bld gHb Est-mCnc: 174 mg/dL
Hgb A1c MFr Bld: 7.7 % — ABNORMAL HIGH (ref 4.8–5.6)

## 2022-03-07 ENCOUNTER — Telehealth: Payer: Self-pay

## 2022-03-07 ENCOUNTER — Other Ambulatory Visit: Payer: Self-pay | Admitting: Critical Care Medicine

## 2022-03-07 MED ORDER — INSULIN GLARGINE 100 UNIT/ML SOLOSTAR PEN: 5.0000 [IU] | PEN_INJECTOR | Freq: Every day | SUBCUTANEOUS | 0 refills | Status: DC

## 2022-03-07 MED ORDER — INSULIN PEN NEEDLE 32G X 4 MM MISC: 1.0000 "application " | Freq: Every day | 0 refills | Status: DC

## 2022-03-07 NOTE — Telephone Encounter (Signed)
-----   Message from Storm Frisk, MD sent at 03/07/2022 12:36 PM EDT ----- Let pt know A1c still too high. She will need to RESUME insulin Glargine one injection daily   insulin sent to our pharmacy with insuiln pen needles  follow dosing directions.  Keep appt with luke in august Insulin sent to her [facility] Hep c neg  all other labs ok,  cholesterol high obtain and take the crestor cholesterol pill we sent

## 2022-03-07 NOTE — Telephone Encounter (Signed)
Pt was called and is aware of results, DOB was confirmed.  ?

## 2022-03-07 NOTE — Telephone Encounter (Signed)
Pt given lab results per notes of Dr. Delford Field on 03/07/22. Pt verbalized understanding.

## 2022-03-07 NOTE — Progress Notes (Signed)
Let pt know A1c still too high. She will need to RESUME insulin Glargine one injection daily   insulin sent to our pharmacy with insuiln pen needles  follow dosing directions.  Keep appt with luke in august Insulin sent to her [facility] Hep c neg  all other labs ok,  cholesterol high obtain and take the crestor cholesterol pill we sent

## 2022-03-09 ENCOUNTER — Other Ambulatory Visit: Payer: Self-pay | Admitting: Physical Medicine and Rehabilitation

## 2022-03-18 ENCOUNTER — Other Ambulatory Visit: Payer: Self-pay

## 2022-03-22 ENCOUNTER — Other Ambulatory Visit: Payer: Self-pay | Admitting: Physical Medicine & Rehabilitation

## 2022-03-22 ENCOUNTER — Other Ambulatory Visit: Payer: Self-pay | Admitting: Critical Care Medicine

## 2022-03-22 NOTE — Telephone Encounter (Signed)
Requested Prescriptions  Pending Prescriptions Disp Refills  . celecoxib (CELEBREX) 100 MG capsule [Pharmacy Med Name: CELECOXIB 100MG CAPSULES] 60 capsule     Sig: TAKE 1 CAPSULE BY MOUTH TWICE DAILY WITH FOOD     Analgesics:  COX2 Inhibitors Failed - 03/22/2022  9:37 AM      Failed - Manual Review: Labs are only required if the patient has taken medication for more than 8 weeks.      Failed - HGB in normal range and within 360 days    Hemoglobin  Date Value Ref Range Status  03/05/2022 10.9 (L) 11.1 - 15.9 g/dL Final         Failed - Cr in normal range and within 360 days    Creatinine, Ser  Date Value Ref Range Status  03/05/2022 1.68 (H) 0.57 - 1.00 mg/dL Final         Failed - HCT in normal range and within 360 days    Hematocrit  Date Value Ref Range Status  03/05/2022 32.6 (L) 34.0 - 46.6 % Final         Passed - AST in normal range and within 360 days    AST  Date Value Ref Range Status  03/05/2022 25 0 - 40 IU/L Final         Passed - ALT in normal range and within 360 days    ALT  Date Value Ref Range Status  03/05/2022 19 0 - 32 IU/L Final         Passed - eGFR is 30 or above and within 360 days    GFR calc Af Amer  Date Value Ref Range Status  04/14/2020 >60 >60 mL/min Final   GFR, Estimated  Date Value Ref Range Status  01/16/2022 32 (L) >60 mL/min Final    Comment:    (NOTE) Calculated using the CKD-EPI Creatinine Equation (2021)    eGFR  Date Value Ref Range Status  03/05/2022 38 (L) >59 mL/min/1.73 Final         Passed - Patient is not pregnant      Passed - Valid encounter within last 12 months    Recent Outpatient Visits          2 weeks ago Cerebrovascular accident (CVA) due to thrombosis of precerebral artery [facility])   [place] Elsie Stain, [place]   4 years ago Uncontrolled type 2 diabetes mellitus with hyperglycemia [facility])   Primary Care at [place], [place]   4 years ago Uncontrolled type 2  diabetes mellitus without complication [facility])   Primary Care at [place], [place]      Future Appointments            In 1 week Penumalli, Earlean Polka, MD [facility]   In 2 weeks Daisy Blossom, Jarome Matin, [place]   In 3 weeks Joya Gaskins Burnett Harry, MD [place]           . pantoprazole (PROTONIX) 40 MG tablet [Pharmacy Med Name: PANTOPRAZOLE 40MG TABLETS] 90 tablet     Sig: TAKE 1 TABLET(40 MG) BY MOUTH AT BEDTIME     Gastroenterology: Proton Pump Inhibitors Passed - 03/22/2022  9:37 AM      Passed - Valid encounter within last 12 months    Recent Outpatient Visits          2 weeks ago Cerebrovascular accident (CVA) due to  thrombosis of precerebral artery [facility])   [place] Elsie Stain, MD   4 years ago Uncontrolled type 2 diabetes mellitus with hyperglycemia [facility])   Primary Care at [place], [place]   4 years ago Uncontrolled type 2 diabetes mellitus without complication [facility])   Primary Care at [place], [place]      Future Appointments            In 1 week Penumalli, Earlean Polka, MD [facility]   In 2 weeks Daisy Blossom, Jarome Matin, [place]   In 3 weeks Joya Gaskins Burnett Harry, MD [place]

## 2022-03-22 NOTE — Telephone Encounter (Signed)
Requested medication (s) are due for refill today: yes  Requested medication (s) are on the active medication list: no  Last refill:  02/01/21  Future visit scheduled: yes  Notes to clinic:  rx isn't listed on pt med list. Please assess for refill     Requested Prescriptions  Pending Prescriptions Disp Refills   celecoxib (CELEBREX) 100 MG capsule [Pharmacy Med Name: CELECOXIB 100MG CAPSULES] 60 capsule     Sig: TAKE 1 CAPSULE BY MOUTH TWICE DAILY WITH FOOD     Analgesics:  COX2 Inhibitors Failed - 03/22/2022  9:37 AM      Failed - Manual Review: Labs are only required if the patient has taken medication for more than 8 weeks.      Failed - HGB in normal range and within 360 days    Hemoglobin  Date Value Ref Range Status  03/05/2022 10.9 (L) 11.1 - 15.9 g/dL Final         Failed - Cr in normal range and within 360 days    Creatinine, Ser  Date Value Ref Range Status  03/05/2022 1.68 (H) 0.57 - 1.00 mg/dL Final         Failed - HCT in normal range and within 360 days    Hematocrit  Date Value Ref Range Status  03/05/2022 32.6 (L) 34.0 - 46.6 % Final         Passed - AST in normal range and within 360 days    AST  Date Value Ref Range Status  03/05/2022 25 0 - 40 IU/L Final         Passed - ALT in normal range and within 360 days    ALT  Date Value Ref Range Status  03/05/2022 19 0 - 32 IU/L Final         Passed - eGFR is 30 or above and within 360 days    GFR calc Af Amer  Date Value Ref Range Status  04/14/2020 >60 >60 mL/min Final   GFR, Estimated  Date Value Ref Range Status  01/16/2022 32 (L) >60 mL/min Final    Comment:    (NOTE) Calculated using the CKD-EPI Creatinine Equation (2021)    eGFR  Date Value Ref Range Status  03/05/2022 38 (L) >59 mL/min/1.73 Final         Passed - Patient is not pregnant      Passed - Valid encounter within last 12 months    Recent Outpatient Visits           2 weeks ago Cerebrovascular accident (CVA) due to  thrombosis of precerebral artery [facility])   [place] Elsie Stain, MD   4 years ago Uncontrolled type 2 diabetes mellitus with hyperglycemia [facility])   Primary Care at [place], [place]   4 years ago Uncontrolled type 2 diabetes mellitus without complication [facility])   Primary Care at [place], [place]       Future Appointments             In 1 week Penumalli, Earlean Polka, MD [facility]   In 2 weeks Daisy Blossom, Jarome Matin, [place]   In 3 weeks Elsie Stain, MD [place]            Signed Prescriptions Disp Refills   pantoprazole (PROTONIX) 40 MG tablet 90 tablet 0    Sig: TAKE 1 TABLET(40 MG) BY  MOUTH AT BEDTIME     Gastroenterology: Proton Pump Inhibitors Passed - 03/22/2022  9:37 AM      Passed - Valid encounter within last 12 months    Recent Outpatient Visits           2 weeks ago Cerebrovascular accident (CVA) due to thrombosis of precerebral artery [facility])   [place] Elsie Stain, MD   4 years ago Uncontrolled type 2 diabetes mellitus with hyperglycemia [facility])   Primary Care at [place], [place]   4 years ago Uncontrolled type 2 diabetes mellitus without complication [facility])   Primary Care at [place], [place]       Future Appointments             In 1 week Penumalli, Earlean Polka, MD [facility]   In 2 weeks Daisy Blossom, Jarome Matin, [place]   In 3 weeks Joya Gaskins Burnett Harry, MD [place]

## 2022-03-25 ENCOUNTER — Telehealth: Payer: Self-pay

## 2022-03-25 ENCOUNTER — Other Ambulatory Visit: Payer: Self-pay

## 2022-03-25 MED ORDER — BASAGLAR KWIKPEN 100 UNIT/ML ~~LOC~~ SOPN
5.0000 [IU] | PEN_INJECTOR | Freq: Every day | SUBCUTANEOUS | 0 refills | Status: DC
Start: 2022-03-25 — End: 2022-04-18

## 2022-03-25 NOTE — Telephone Encounter (Signed)
Lantus is non-preferred on patient's ins.  If appropriate, can the prescription be changed and resent to [employer] for [employer], [place], or Evaristo B[place]?

## 2022-03-25 NOTE — Telephone Encounter (Signed)
Rx sent for Basaglar. 

## 2022-03-29 ENCOUNTER — Ambulatory Visit: Payer: Self-pay

## 2022-03-29 ENCOUNTER — Telehealth: Payer: BC Managed Care – PPO | Admitting: Physician Assistant

## 2022-03-29 DIAGNOSIS — R159 Full incontinence of feces: Secondary | ICD-10-CM

## 2022-03-29 DIAGNOSIS — R197 Diarrhea, unspecified: Secondary | ICD-10-CM

## 2022-03-29 MED ORDER — LOPERAMIDE HCL 2 MG PO CAPS: 2.0000 mg | ORAL_CAPSULE | Freq: Four times a day (QID) | ORAL | 0 refills | Status: DC | PRN

## 2022-03-29 MED ORDER — DICYCLOMINE HCL 10 MG PO CAPS: 10.0000 mg | ORAL_CAPSULE | Freq: Three times a day (TID) | ORAL | 0 refills | Status: DC

## 2022-03-29 NOTE — Progress Notes (Signed)
Virtual Visit Consent   Stacey Lucas, you are scheduled for a virtual visit with a [facility] provider today. Just as with appointments in the office, your consent must be obtained to participate. Your consent will be active for this visit and any virtual visit you may have with one of our providers in the next 365 days. If you have a MyChart account, a copy of this consent can be sent to you electronically.  As this is a virtual visit, video technology does not allow for your provider to perform a traditional examination. This may limit your provider's ability to fully assess your condition. If your provider identifies any concerns that need to be evaluated in person or the need to arrange testing (such as labs, EKG, etc.), we will make arrangements to do so. Although advances in technology are sophisticated, we cannot ensure that it will always work on either your end or our end. If the connection with a video visit is poor, the visit may have to be switched to a telephone visit. With either a video or telephone visit, we are not always able to ensure that we have a secure connection.  By engaging in this virtual visit, you consent to the provision of healthcare and authorize for your insurance to be billed (if applicable) for the services provided during this visit. Depending on your insurance coverage, you may receive a charge related to this service.  I need to obtain your verbal consent now. Are you willing to proceed with your visit today? Stacey Lucas has provided verbal consent on 03/29/2022 for a virtual visit (video or telephone). Mar Daring, PA-C  Date: 03/29/2022 6:48 PM  Virtual Visit via Video Note   I, Mar Daring, connected with  Stacey Lucas  ([id number], [birth year]) on 03/29/22 at  6:15 PM EDT by a video-enabled telemedicine application and verified that I am speaking with the correct person using two identifiers.  Location: Patient: Virtual Visit Location  Patient: Home Provider: Virtual Visit Location Provider: Home Office   I discussed the limitations of evaluation and management by telemedicine and the availability of in person appointments. The patient expressed understanding and agreed to proceed.    History of Present Illness: Stacey Lucas is a 46 y.o. who identifies as a female who was assigned female at birth, and is being seen today for diarrhea and fecal incontinence.  HPI: Diarrhea  This is a new problem. The current episode started in the past 7 days (3 days). The problem occurs more than 10 times per day. The problem has been gradually worsening. The stool consistency is described as Watery. The patient states that diarrhea awakens her from sleep. Pertinent negatives include no abdominal pain, bloating, chills, fever, headaches, increased  flatus, myalgias, sweats or vomiting. Nothing aggravates the symptoms. There are no known risk factors. She has tried nothing for the symptoms. The treatment provided no relief.      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 12/29/2021   Atrial fibrillation ([place]) 07/04/2021   H/O: stroke 07/04/2021   Cerebral thrombosis with cerebral infarction 05/24/2021   Osteoarthritis of right knee 05/24/2021   Type 2 diabetes mellitus without complication ([place]) 01/00/7121   Essential hypertension 05/23/2021   Hyperlipidemia 05/23/2021   Thyroid nodule  05/23/2021   MDD (major depressive disorder), recurrent severe, without psychosis ([place]) 04/27/2018    Allergies:  Allergies  Allergen Reactions   Gabapentin     Weakness/impairs balance   Medications:  Current Outpatient Medications:    dicyclomine (BENTYL) 10 MG capsule, Take 1 capsule (10 mg total)  by mouth 4 (four) times daily -  before meals and at bedtime., Disp: 40 capsule, Rfl: 0   loperamide (IMODIUM) 2 MG capsule, Take 1 capsule (2 mg total) by mouth every 6 (six) hours as needed for diarrhea or loose stools., Disp: 30 capsule, Rfl: 0   ACCU-CHEK GUIDE test strip, USE UP TO FOUR TIMES DAILY AS DIRECTED, Disp: , Rfl:    Accu-Chek Softclix Lancets lancets, SMARTSIG:Topical 1-4 Times Daily, Disp: , Rfl:    amLODipine (NORVASC) 10 MG tablet, Take 1 tablet (10 mg total) by mouth daily., Disp: 60 tablet, Rfl: 2   aspirin 81 MG EC tablet, Take 1 tablet (81 mg total) by mouth daily. Swallow whole., Disp: 100 tablet, Rfl: 0   blood glucose meter kit and supplies KIT, Dispense based on patient and insurance preference. Use up to four times daily as directed., Disp: 1 each, Rfl: 0   buPROPion (WELLBUTRIN XL) 150 MG 24 hr tablet, TAKE 1 TABLET(150 MG) BY MOUTH DAILY, Disp: 30 tablet, Rfl: 0   citalopram (CELEXA) 10 MG tablet, Take 1 tablet (10 mg total) by mouth daily., Disp: 60 tablet, Rfl: 3   clopidogrel (PLAVIX) 75 MG tablet, TAKE 1 TABLET(75 MG) BY MOUTH DAILY, Disp: 30 tablet, Rfl: 0   diclofenac Sodium (VOLTAREN) 1 % GEL, Apply 4 g topically daily as needed (For knee pain)., Disp: 400 g, Rfl: 1   glimepiride (AMARYL) 2 MG tablet, Take 1 tablet (2 mg total) by mouth daily with breakfast., Disp: 60 tablet, Rfl: 2   Insulin Glargine (BASAGLAR KWIKPEN) 100 UNIT/ML, Inject 5 Units into the skin daily., Disp: 15 mL, Rfl: 0   Insulin Pen Needle 32G X 4 MM MISC, Use to inject insulin at bed time, Disp: 100 each, Rfl: 0   irbesartan (AVAPRO) 75 MG tablet, Take 1 tablet (75 mg total) by mouth daily., Disp: 60 tablet, Rfl: 2   meloxicam (MOBIC) 15 MG tablet, Take 1 tablet (15 mg total) by mouth daily., Disp: 30 tablet, Rfl: 1   metoprolol succinate (TOPROL-XL) 25 MG 24 hr tablet, Take 3 tablets (75 mg total) by mouth daily., Disp: 180 tablet, Rfl: 1   pantoprazole (PROTONIX) 40 MG tablet, TAKE 1  TABLET(40 MG) BY MOUTH AT BEDTIME, Disp: 30 tablet, Rfl: 0   pantoprazole (PROTONIX) 40 MG tablet, TAKE 1 TABLET(40 MG) BY MOUTH AT BEDTIME, Disp: 90 tablet, Rfl: 0   rosuvastatin (CRESTOR) 20 MG tablet, Take 1 tablet (20 mg total) by mouth at bedtime., Disp: 60 tablet, Rfl: 2  Observations/Objective: Patient is well-developed, well-nourished in no acute distress.  Resting comfortably at home.  Head is normocephalic, atraumatic.  No labored breathing.  Speech is clear and coherent with logical content.  Patient is alert and oriented at baseline.    Assessment and Plan: 1. Diarrhea, unspecified type - loperamide (IMODIUM) 2 MG capsule; Take 1 capsule (2 mg total) by mouth every 6 (six) hours as needed for diarrhea or loose stools.  Dispense: 30 capsule; Refill: 0 - dicyclomine (BENTYL) 10 MG capsule; Take 1 capsule (10 mg total) by mouth 4 (four) times daily -  before meals and at bedtime.  Dispense: 40 capsule; Refill: 0  2. Full incontinence of feces  - Unknown source; infectious, functional, secondary to CVA or T2DM - Will treat with Imodium and Bentyl - If no improvement over the weekend she is to be seen in person next week - If symptoms worsen at anytime, she should be seen in person  Follow Up Instructions: I discussed the assessment and treatment plan with the patient. The patient was provided an opportunity to ask questions and all were answered. The patient agreed with the plan and demonstrated an understanding of the instructions.  A copy of instructions were sent to the patient via MyChart unless otherwise noted below.    The patient was advised to call back or seek an in-person evaluation if the symptoms worsen or if the condition fails to improve as anticipated.  Time:  I spent 12 minutes with the patient via telehealth technology discussing the above problems/concerns.    Mar Daring, PA-C

## 2022-03-29 NOTE — Telephone Encounter (Signed)
  Chief Complaint: diarrhea Symptoms: diarrhea with incontinence Frequency: 3 days pretty constant Pertinent Negatives: Patient denies abdominal pain, N/V Disposition: [] ED /[] Urgent Care (no appt availability in office) / [] Appointment(In office/virtual)/ [x]  [facility]/ [] Home Care/ [] Refused Recommended Disposition /[] [place] [facility]/ []  Follow-up with PCP Additional Notes: pt states she doesn't have control over her bowels and is pooping everywhere. Scheduled pt for virtual UC visit at 1815.   Reason for Disposition  [1] SEVERE diarrhea (e.g., 7 or more times / day more than normal) AND [2] present > 24 hours (1 day)  Answer Assessment - Initial Assessment Questions 1. DIARRHEA SEVERITY: "How bad is the diarrhea?" "How many more stools have you had in the past 24 hours than normal?"    - NO DIARRHEA (SCALE 0)   - MILD (SCALE 1-3): Few loose or mushy BMs; increase of 1-3 stools over normal daily number of stools; mild increase in ostomy output.   -  MODERATE (SCALE 4-7): Increase of 4-6 stools daily over normal; moderate increase in ostomy output.   -  SEVERE (SCALE 8-10; OR "WORST POSSIBLE"): Increase of 7 or more stools daily over normal; moderate increase in ostomy output; incontinence.     severe 2. ONSET: "When did the diarrhea begin?"      3 days  4. VOMITING: "Are you also vomiting?" If Yes, ask: "How many times in the past 24 hours?"      no 5. ABDOMEN PAIN: "Are you having any abdomen pain?" If Yes, ask: "What does it feel like?" (e.g., crampy, dull, intermittent, constant)      no 8. HYDRATION: "Any signs of dehydration?" (e.g., dry mouth [not just dry lips], too weak to stand, dizziness, new weight loss) "When did you last urinate?"     no 11. OTHER SYMPTOMS: "Do you have any other symptoms?" (e.g., fever, blood in stool)       Incontinence  Protocols used: Diarrhea-A-AH

## 2022-03-29 NOTE — Patient Instructions (Addendum)
Stacey Lucas, thank you for joining Mar Daring, PA-C for today's virtual visit.  While this provider is not your primary care provider (PCP), if your PCP is located in our provider database this encounter information will be shared with them immediately following your visit.  Consent: (Patient) Stacey Lucas provided verbal consent for this virtual visit at the beginning of the encounter.  Current Medications:  Current Outpatient Medications:    dicyclomine (BENTYL) 10 MG capsule, Take 1 capsule (10 mg total) by mouth 4 (four) times daily -  before meals and at bedtime., Disp: 40 capsule, Rfl: 0   loperamide (IMODIUM) 2 MG capsule, Take 1 capsule (2 mg total) by mouth every 6 (six) hours as needed for diarrhea or loose stools., Disp: 30 capsule, Rfl: 0   ACCU-CHEK GUIDE test strip, USE UP TO FOUR TIMES DAILY AS DIRECTED, Disp: , Rfl:    Accu-Chek Softclix Lancets lancets, SMARTSIG:Topical 1-4 Times Daily, Disp: , Rfl:    amLODipine (NORVASC) 10 MG tablet, Take 1 tablet (10 mg total) by mouth daily., Disp: 60 tablet, Rfl: 2   aspirin 81 MG EC tablet, Take 1 tablet (81 mg total) by mouth daily. Swallow whole., Disp: 100 tablet, Rfl: 0   blood glucose meter kit and supplies KIT, Dispense based on patient and insurance preference. Use up to four times daily as directed., Disp: 1 each, Rfl: 0   buPROPion (WELLBUTRIN XL) 150 MG 24 hr tablet, TAKE 1 TABLET(150 MG) BY MOUTH DAILY, Disp: 30 tablet, Rfl: 0   citalopram (CELEXA) 10 MG tablet, Take 1 tablet (10 mg total) by mouth daily., Disp: 60 tablet, Rfl: 3   clopidogrel (PLAVIX) 75 MG tablet, TAKE 1 TABLET(75 MG) BY MOUTH DAILY, Disp: 30 tablet, Rfl: 0   diclofenac Sodium (VOLTAREN) 1 % GEL, Apply 4 g topically daily as needed (For knee pain)., Disp: 400 g, Rfl: 1   glimepiride (AMARYL) 2 MG tablet, Take 1 tablet (2 mg total) by mouth daily with breakfast., Disp: 60 tablet, Rfl: 2   Insulin Glargine (BASAGLAR KWIKPEN) 100 UNIT/ML, Inject 5  Units into the skin daily., Disp: 15 mL, Rfl: 0   Insulin Pen Needle 32G X 4 MM MISC, Use to inject insulin at bed time, Disp: 100 each, Rfl: 0   irbesartan (AVAPRO) 75 MG tablet, Take 1 tablet (75 mg total) by mouth daily., Disp: 60 tablet, Rfl: 2   meloxicam (MOBIC) 15 MG tablet, Take 1 tablet (15 mg total) by mouth daily., Disp: 30 tablet, Rfl: 1   metoprolol succinate (TOPROL-XL) 25 MG 24 hr tablet, Take 3 tablets (75 mg total) by mouth daily., Disp: 180 tablet, Rfl: 1   pantoprazole (PROTONIX) 40 MG tablet, TAKE 1 TABLET(40 MG) BY MOUTH AT BEDTIME, Disp: 30 tablet, Rfl: 0   pantoprazole (PROTONIX) 40 MG tablet, TAKE 1 TABLET(40 MG) BY MOUTH AT BEDTIME, Disp: 90 tablet, Rfl: 0   rosuvastatin (CRESTOR) 20 MG tablet, Take 1 tablet (20 mg total) by mouth at bedtime., Disp: 60 tablet, Rfl: 2   Medications ordered in this encounter:  Meds ordered this encounter  Medications   loperamide (IMODIUM) 2 MG capsule    Sig: Take 1 capsule (2 mg total) by mouth every 6 (six) hours as needed for diarrhea or loose stools.    Dispense:  30 capsule    Refill:  0    Order Specific Question:   Supervising Provider    Answer:   MILLER, BRIAN [[id number]]   dicyclomine (BENTYL) 10 MG  capsule    Sig: Take 1 capsule (10 mg total) by mouth 4 (four) times daily -  before meals and at bedtime.    Dispense:  40 capsule    Refill:  0    Order Specific Question:   Supervising Provider    Answer:   Sabra Heck, [place]     *If you need refills on other medications prior to your next appointment, please contact your pharmacy*  Follow-Up: Call back or seek an in-person evaluation if the symptoms worsen or if the condition fails to improve as anticipated.  Other Instructions Fecal Incontinence Fecal incontinence, also called accidental bowel leakage, is not being able to control your bowels. This condition happens because the nerves or muscles around the anus do not work the way they should. This affects their ability  to hold stool (feces). What are the causes? This condition may be caused by: Damage to the muscles at the end of the rectum (sphincter). Damage to the nerves that control bowel movements. Diarrhea. Chronic constipation. Pelvic floor dysfunction. This means the muscles in the pelvis do not work well. Loss of bowel storage capacity. This occurs when the rectum can no longer stretch in size in order to store feces. Inflammatory bowel disease (IBD), such as Crohn's disease. Irritable bowel syndrome (IBS). What increases the risk? You are more likely to develop this condition if you: Were born with bowels or a pelvis that did not form correctly. Have had rectal surgery. Have had radiation treatment for certain cancers. Have been pregnant, had a vaginal delivery, or had surgery that damaged the pelvic floor muscles. Had a complicated childbirth, spinal cord injury, or other trauma that caused nerve damage. Have a condition that can affect nerve function, such as diabetes, Parkinson's disease, or multiple sclerosis. Have a condition where the rectum drops down into the anus or vagina (prolapse). Are 67 years of age or older. What are the signs or symptoms? The main symptom of this condition is not being able to control your bowels. You also might not be able to get to the bathroom before a bowel movement. How is this diagnosed? This condition is diagnosed with a medical history and physical exam. You may also have other tests, including: Blood tests. Urine tests. A rectal exam. Ultrasound. MRI. Colonoscopy. This is an exam that looks at your large intestine (colon). Anal manometry. This is a test that measures the strength of the anal sphincter. Anal electromyogram (EMG). This is a test that uses small electrodes to check for nerve damage. How is this treated? Treatment for this condition depends on the cause and severity. Treatment may also focus on addressing any underlying causes of  this condition. Treatment may include: Medicines. This may include medicines to: Prevent diarrhea. Help with constipation (bulk-forming laxatives). Treat any underlying conditions. Biofeedback therapy. This can help to retrain muscles that are affected. Fiber supplements. These can help manage your bowel movements. Nerve stimulation. Injectable gel to promote tissue growth and better muscle control. Surgery. You may need: Sphincter repair surgery. Diversion surgery. This procedure lets feces pass out of your body through a hole in your abdomen. Follow these instructions at home: Eating and drinking  Follow instructions from your health care provider about any eating or drinking restrictions. Work with a dietitian to come up with a healthy diet that will help you avoid the foods that can make your condition worse. Keep a diet diary to find out which foods or drinks could be making your  condition worse. Drink enough fluid to keep your urine pale yellow. Lifestyle Do not use any products that contain nicotine or tobacco, such as cigarettes and e-cigarettes. If you need help quitting, ask your health care provider. This may help your condition. If you are overweight, talk with your health care provider about how to safely lose weight. This may help your condition. Increase your physical activity as told by your health care provider. This may help your condition. Always talk with your health care provider before starting a new exercise program. Carry a change of clothes and supplies to clean up quickly if you have an episode of fecal incontinence. Consider joining a fecal incontinence support group. You can find a support group online or in your local community. General instructions  Take over-the-counter and prescription medicines only as told by your health care provider. This includes any supplements. Apply a moisture barrier, such as petroleum jelly, to your rectum. This protects the skin  from irritation caused by ongoing leaking or diarrhea. Tell your health care provider if you are upset or depressed about your condition. Keep all follow-up visits as told by your health care provider. This is important. Where to find more information International Foundation for Functional Gastrointestinal Disorders: iffgd.[place] of Gastroenterology: patients.gi.org Contact a health care provider if: You have a fever. You have redness, swelling, or pain around your rectum. Your pain is getting worse or you lose feeling in your rectal area. You have blood in your stool. You feel sad or hopeless. You avoid social or work situations. Get help right away if: You stop having bowel movements. You cannot eat or drink without vomiting. You have rectal bleeding that does not stop. You have severe pain that is getting worse. You have symptoms of dehydration, including: Sleepiness or fatigue. Producing little or no urine, tears, or sweat. Dizziness. Dry mouth. Unusual irritability. Headache. Inability to think clearly. Summary Fecal incontinence, also called accidental bowel leakage, is not being able to control your bowels. This condition happens because the nerves or muscles around the anus do not work the way they should. Treatment varies depending on the cause and severity of your condition. Treatment may also focus on addressing any underlying causes of this condition. Follow instructions from your health care provider about any eating or drinking restrictions, lifestyle changes, and skin care. Take over-the-counter and prescription medicines only as told by your health care provider. This includes any supplements. Tell your health care provider if your symptoms worsen or if you are upset or depressed about your condition. This information is not intended to replace advice given to you by your health care provider. Make sure you discuss any questions you have with your health  care provider. Document Revised: 03/06/2021 Document Reviewed: 03/07/2021 Elsevier Patient Education  [place] Choices to Help Relieve Diarrhea, Adult Diarrhea can make you feel weak and cause you to become dehydrated. It is important to choose the right foods and drinks to: Relieve diarrhea. Replace lost fluids and nutrients. Prevent dehydration. What are tips for following this plan? Relieving diarrhea Avoid foods that make your diarrhea worse. These may include: Foods and beverages sweetened with high-fructose corn syrup, honey, or sweeteners such as xylitol, sorbitol, and mannitol. Fried, greasy, or spicy foods. Raw fruits and vegetables. Eat foods that are rich in probiotics. These include foods such as yogurt and fermented milk products. Probiotics can help increase healthy bacteria in your stomach and intestines (gastrointestinal tract or GI tract).  This may help digestion and stop diarrhea. If you have lactose intolerance, avoid dairy products. These may make your diarrhea worse. Take medicine to help stop diarrhea only as told by your health care provider. Replacing nutrients  Eat bland, easy-to-digest foods in small amounts as you are able, until your diarrhea starts to get better. These foods include bananas, applesauce, rice, toast, and crackers. Gradually reintroduce nutrient-rich foods as tolerated or as told by your health care provider. This includes: Well-cooked protein foods, such as eggs, lean meats like fish or chicken without skin, and tofu. Peeled, seeded, and soft-cooked fruits and vegetables. Low-fat dairy products. Whole grains. Take vitamin and mineral supplements as told by your health care provider. Preventing dehydration  Start by sipping water or a solution to prevent dehydration (oral rehydration solution, ORS). This is a drink that helps replace fluids and minerals your body has lost. You can buy an ORS at pharmacies and retail  stores. Try to drink at least 8-10 cups (2,000-2,500 mL) of fluid each day to help replace lost fluids. If you have urine that is pale yellow, you are getting enough fluids. You may drink other liquids in addition to water, such as fruit juice that you have added water to (diluted fruit juice) or low-calorie sports drinks, as tolerated or as told by your health care provider. Avoid drinks with caffeine, such as coffee, tea, or soft drinks. Avoid alcohol. Summary When you have diarrhea, it is important to choose the right foods and drinks to relieve diarrhea, to replace lost fluids and nutrients, and to prevent dehydration. Make sure you drink enough fluid to keep your urine pale yellow. You may benefit from eating bland foods at first. Gradually reintroduce healthy, nutrient-rich foods as tolerated or as told by your health care provider. Avoid foods that make your diarrhea worse, such as fried, greasy, or spicy foods. This information is not intended to replace advice given to you by your health care provider. Make sure you discuss any questions you have with your health care provider. Document Revised: 10/12/2019 Document Reviewed: 10/12/2019 Elsevier Patient Education  [place].    If you have been instructed to have an in-person evaluation today at a local Urgent Care facility, please use the link below. It will take you to a list of all of our available [facility], including address, phone number and hours of operation. Please do not delay care.  [facility]  If you or a family member do not have a primary care provider, use the link below to schedule a visit and establish care. When you choose a [facility] primary care physician or advanced practice provider, you gain a long-term partner in health. Find a Primary Care Provider  Learn more about 's in-office and virtual care options: [facility]

## 2022-03-30 NOTE — Telephone Encounter (Signed)
Pt has had a virtual visit.

## 2022-04-04 ENCOUNTER — Inpatient Hospital Stay: Payer: BC Managed Care – PPO | Admitting: Diagnostic Neuroimaging

## 2022-04-04 ENCOUNTER — Telehealth: Payer: Self-pay | Admitting: Diagnostic Neuroimaging

## 2022-04-04 NOTE — Telephone Encounter (Signed)
Pt cancelled appt due car trouble. Transferred to Billing.

## 2022-04-08 ENCOUNTER — Other Ambulatory Visit: Payer: Self-pay | Admitting: Critical Care Medicine

## 2022-04-08 NOTE — Telephone Encounter (Signed)
Medication Refill - Medication: amLODipine (NORVASC) 10 MG tablet  Has the patient contacted their pharmacy? Yes.     Preferred Pharmacy (with phone number or street name):  [id number] - , [place] - [address] AT [facility] OF [address] Phone:  [phone]  Fax:  [phone]     Has the patient been seen for an appointment in the last year OR does the patient have an upcoming appointment? Yes.

## 2022-04-08 NOTE — Telephone Encounter (Unsigned)
Copied from CRM [id number]. Topic: General - Other >> Apr 08, 2022  9:57 AM Everette C wrote: Reason for CRM: Medication Refill - Medication: clopidogrel (PLAVIX) 75 MG tablet [[id number]]   Has the patient contacted their pharmacy? Yes.  The patient was directed to contact their PCP (Agent: If no, request that the patient contact the pharmacy for the refill. If patient does not wish to contact the pharmacy document the reason why and proceed with request.) (Agent: If yes, when and what did the pharmacy advise?)  Preferred Pharmacy (with phone number or street name): [facility] #[id number] - [place], [place] - [address] AT [facility] OF [address] [place] [facility] [address] Phone: [phone] Fax: [phone] Hours: Open 24 hours  Has the patient been seen for an appointment in the last year OR does the patient have an upcoming appointment? Yes.    Agent: Please be advised that RX refills may take up to 3 business days. We ask that you follow-up with your pharmacy.

## 2022-04-09 ENCOUNTER — Ambulatory Visit: Payer: BC Managed Care – PPO | Admitting: Pharmacist

## 2022-04-09 MED ORDER — AMLODIPINE BESYLATE 10 MG PO TABS
10.0000 mg | ORAL_TABLET | Freq: Every day | ORAL | 0 refills | Status: DC
Start: 2022-04-09 — End: 2022-04-18

## 2022-04-09 NOTE — Telephone Encounter (Signed)
Requested medication (s) are due for refill today: yes  Requested medication (s) are on the active medication list: yes  Last refill:  02/28/22 #30 with 0 RF, written by Dr. Natale Lay, he asked it be taken over by PCP or neurologist.  Future visit scheduled: 04/18/22, seen 03/05/22  Notes to clinic:  Prescriber not in this practice, please assess.      Requested Prescriptions  Pending Prescriptions Disp Refills   clopidogrel (PLAVIX) 75 MG tablet 30 tablet 0     Hematology: Antiplatelets - clopidogrel Failed - 04/08/2022 10:11 AM      Failed - HCT in normal range and within 180 days    Hematocrit  Date Value Ref Range Status  03/05/2022 32.6 (L) 34.0 - 46.6 % Final         Failed - HGB in normal range and within 180 days    Hemoglobin  Date Value Ref Range Status  03/05/2022 10.9 (L) 11.1 - 15.9 g/dL Final         Failed - Cr in normal range and within 360 days    Creatinine, Ser  Date Value Ref Range Status  03/05/2022 1.68 (H) 0.57 - 1.00 mg/dL Final         Passed - PLT in normal range and within 180 days    Platelets  Date Value Ref Range Status  03/05/2022 287 150 - 450 x10E3/uL Final         Passed - Valid encounter within last 6 months    Recent Outpatient Visits           1 month ago Cerebrovascular accident (CVA) due to thrombosis of precerebral artery [facility])   [facility] Storm Frisk, MD   4 years ago Uncontrolled type 2 diabetes mellitus with hyperglycemia [facility])   Primary Care at [facility], [place], [place]   4 years ago Uncontrolled type 2 diabetes mellitus without complication [facility])   Primary Care at [facility], [place], [place]       Future Appointments             Today Lois Huxley, Cornelius Moras, RPH-CPP [facility]   In 1 week Storm Frisk, MD [facility]

## 2022-04-09 NOTE — Telephone Encounter (Signed)
Pt previous rx sent to different [facility] location Requested Prescriptions  Pending Prescriptions Disp Refills  . amLODipine (NORVASC) 10 MG tablet 60 tablet 2    Sig: Take 1 tablet (10 mg total) by mouth daily.     Cardiovascular: Calcium Channel Blockers 2 Passed - 04/08/2022 10:30 AM      Passed - Last BP in normal range    BP Readings from Last 1 Encounters:  03/05/22 113/74         Passed - Last Heart Rate in normal range    Pulse Readings from Last 1 Encounters:  03/05/22 95         Passed - Valid encounter within last 6 months    Recent Outpatient Visits          1 month ago Cerebrovascular accident (CVA) due to thrombosis of precerebral artery [facility])   [facility] Storm Frisk, MD   4 years ago Uncontrolled type 2 diabetes mellitus with hyperglycemia [facility])   Primary Care at [facility], [place], [place]   4 years ago Uncontrolled type 2 diabetes mellitus without complication [facility])   Primary Care at [facility], [place], [place]      Future Appointments            Today Lois Huxley, Cornelius Moras, RPH-CPP [facility]   In 1 week Storm Frisk, MD [facility]

## 2022-04-10 ENCOUNTER — Other Ambulatory Visit: Payer: Self-pay | Admitting: Critical Care Medicine

## 2022-04-10 MED ORDER — CLOPIDOGREL BISULFATE 75 MG PO TABS: ORAL_TABLET | ORAL | 0 refills | Status: DC

## 2022-04-15 ENCOUNTER — Other Ambulatory Visit: Payer: Self-pay

## 2022-04-15 NOTE — Patient Outreach (Signed)
[facility]) Care Management  04/15/2022  Stacey Lucas [birth year] [id number]   First telephone outreach attempt to obtain mRS. No answer. Left message for returned call.  Vanice Sarah [facility] Management Assistant [phone]

## 2022-04-16 ENCOUNTER — Other Ambulatory Visit: Payer: Self-pay

## 2022-04-16 NOTE — Patient Outreach (Signed)
[facility]) Care Management  04/16/2022  Rylan Kaufmann 11-12-1975 [id number]   Telephone outreach to patient to obtain mRS was successfully completed. MRS= 5  Vanice Sarah [facility] Care Management Assistant [phone]

## 2022-04-18 ENCOUNTER — Ambulatory Visit: Payer: BC Managed Care – PPO | Attending: Critical Care Medicine | Admitting: Critical Care Medicine

## 2022-04-18 ENCOUNTER — Encounter: Payer: Self-pay | Admitting: Critical Care Medicine

## 2022-04-18 VITALS — BP 118/70 | HR 71 | Temp 98.2°F | Resp 12 | Ht 65.0 in | Wt 138.0 lb

## 2022-04-18 DIAGNOSIS — E119 Type 2 diabetes mellitus without complications: Secondary | ICD-10-CM | POA: Diagnosis not present

## 2022-04-18 DIAGNOSIS — I10 Essential (primary) hypertension: Secondary | ICD-10-CM

## 2022-04-18 DIAGNOSIS — N179 Acute kidney failure, unspecified: Secondary | ICD-10-CM

## 2022-04-18 DIAGNOSIS — I6330 Cerebral infarction due to thrombosis of unspecified cerebral artery: Secondary | ICD-10-CM

## 2022-04-18 DIAGNOSIS — E782 Mixed hyperlipidemia: Secondary | ICD-10-CM

## 2022-04-18 DIAGNOSIS — R197 Diarrhea, unspecified: Secondary | ICD-10-CM

## 2022-04-18 DIAGNOSIS — N189 Chronic kidney disease, unspecified: Secondary | ICD-10-CM

## 2022-04-18 DIAGNOSIS — I639 Cerebral infarction, unspecified: Secondary | ICD-10-CM

## 2022-04-18 LAB — GLUCOSE, POCT (MANUAL RESULT ENTRY): POC Glucose: 259 mg/dl — AB (ref 70–99)

## 2022-04-18 MED ORDER — CLOPIDOGREL BISULFATE 75 MG PO TABS: ORAL_TABLET | ORAL | 1 refills | Status: DC

## 2022-04-18 MED ORDER — MELOXICAM 15 MG PO TABS: 15.0000 mg | ORAL_TABLET | Freq: Every day | ORAL | 1 refills | Status: DC

## 2022-04-18 MED ORDER — METOPROLOL SUCCINATE ER 25 MG PO TB24: 75.0000 mg | ORAL_TABLET | Freq: Every day | ORAL | 1 refills | Status: DC

## 2022-04-18 MED ORDER — BUPROPION HCL ER (XL) 150 MG PO TB24: ORAL_TABLET | ORAL | 2 refills | Status: DC

## 2022-04-18 MED ORDER — IRBESARTAN 75 MG PO TABS: 75.0000 mg | ORAL_TABLET | Freq: Every day | ORAL | 2 refills | Status: DC

## 2022-04-18 MED ORDER — BASAGLAR KWIKPEN 100 UNIT/ML ~~LOC~~ SOPN: 5.0000 [IU] | PEN_INJECTOR | Freq: Every day | SUBCUTANEOUS | 2 refills | Status: DC

## 2022-04-18 MED ORDER — GLIMEPIRIDE 2 MG PO TABS: 2.0000 mg | ORAL_TABLET | Freq: Every day | ORAL | 2 refills | Status: DC

## 2022-04-18 MED ORDER — ROSUVASTATIN CALCIUM 20 MG PO TABS: 20.0000 mg | ORAL_TABLET | Freq: Every day | ORAL | 2 refills | Status: DC

## 2022-04-18 MED ORDER — ASPIRIN 81 MG PO TBEC: 81.0000 mg | DELAYED_RELEASE_TABLET | Freq: Every day | ORAL | 2 refills | Status: DC

## 2022-04-18 MED ORDER — CITALOPRAM HYDROBROMIDE 10 MG PO TABS: 10.0000 mg | ORAL_TABLET | Freq: Every day | ORAL | 3 refills | Status: DC

## 2022-04-18 MED ORDER — INSULIN PEN NEEDLE 32G X 4 MM MISC: 1.0000 "application " | Freq: Every day | 0 refills | Status: DC

## 2022-04-18 MED ORDER — DICLOFENAC SODIUM 1 % EX GEL: 4.0000 g | Freq: Every day | CUTANEOUS | 1 refills | Status: DC | PRN

## 2022-04-18 MED ORDER — AMLODIPINE BESYLATE 10 MG PO TABS: 10.0000 mg | ORAL_TABLET | Freq: Every day | ORAL | 1 refills | Status: DC

## 2022-04-18 NOTE — Patient Instructions (Addendum)
Refills on all medications sent to your [facility] on [address]  Begin the insulin as prescribed 10 units daily  Referral back to neurology was made again  Return to see Dr. Delford Field in 6 weeks

## 2022-04-18 NOTE — Progress Notes (Signed)
New Patient Office Visit  Subjective    Patient ID: Stacey Lucas, female    DOB: Sep 08, 1976  Age: 46 y.o. MRN: [id number]  CC:  Chief Complaint  Patient presents with   Hospitalization Follow-up    3 month follow up from stroke. Medication refill. Right side (CVA affected side)is always cold.     HPI Stacey Lucas presents to establish care 6/27 This is a 46 year old female former cocaine user with severe hypertension was admitted for acute stroke in April 2023 she was eventually discharged from acute inpatient rehab May 11.  Below are both discharge summaries.  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  She now is living at home and has an aide which is a friend of hers who is a Chief Executive Officer.  Patient is requesting personal care services and would like a Dexcom meter.  History of type 2 diabetes she was sent out without any insulin but given Amaryl only.  The concern was she might overdose and be hypoglycemic with her recent stroke.  Patient currently is taking the Amaryl only.  On arrival blood sugar was 230.  Blood pressure 113/74 The patient struggles with the diet.  She eats [place] sausage and grits and eggs in the morning to skips lunch and/or-is fast food in the evening.  Patient had a urinary tract infection during the last hospitalization but has not no symptoms now.  She has no other real complaints at this time except for numbness in the right foot and pain in the right knee.  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  She is requesting personal care services to help with bathing and ADLs.  She has difficulty accessing healthy foods.  There was a delay in getting her appointment in our clinic and she did miss 1 appointment due to communication breakdown.  Luckily rehab did agree to bridge her medications at this visit. 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 is a 46 y.o. 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 She had near resolution of original symptoms (left side weakness) and on the way to MRI developed right side weakness, dysarthria, and a facial droop. MRI was aborted and TNKase was given in ED. MRI today shows an acute infarct of the left corona radiata/basal ganglia.  DAPT therapy started 24 hours post TNK.  DVT prophylaxis with Lovenox. TCD and TEE negative.     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 Small vessel disease. Atrophy. ASPECTS 10.    CTA head & neck Atherosclerotic irregularity of the M2 and more distal  MCA vessels, bilaterally. Most notably, there is an apparent moderate/severe focal stenosis within a superior division mid M2 left MCA vessel. MRI  2.4 x 1.1 x 2.0 cm acute infarct within the left corona radiata/basal ganglia. 2D Echo EF 70-75% with grade I diastolic dysfunction TCD and TEE negative LDL 117 HgbA1c 7.1 VTE prophylaxis - lovenox  No antithrombotic prior to admission, now on aspirin 81 mg daily and clopidogrel  75 mg daily.  For 3 weeks and then aspirin alone. Therapy recommendations:  CIR Disposition:  pending   Hypertension Home meds:  valsartan, toprol XL Stable Permissive hypertension (OK if < 220/120) but gradually normalize in 5-7 days Long-term BP goal normotensive   Hyperlipidemia Home meds:  rosuvastatin 20m LDL 117, goal < 70 Increase to rosuvastatin 279mContinue statin at discharge   Diabetes type II Controlled Home meds:  Insulin HgbA1c 7.1, goal < 7.0 CBGs SSI- increased with meal coverage   Other Stroke Risk Factors Hx stroke/TIA 05/2021-right caudate infarct secondary to small vessel disease Cigarette smoker, advised to stop smoking Cocaine use Migraines Currently not on any medications   Other Active Problems UTI on 3d Rocephin, Ucx greater than 100,000 colonies of group B strep sensitivity pending GERD Anxiety/depression Incidental thyroid nodule: follow up with PCP Noted during previous hospital stay  Rehab: Admit date: 01/02/2022 Discharge date: 01/17/2022   Discharge Diagnoses:  Principal Problem:   CVA (cerebral vascular accident) ([facility] Problems:   Type 2 diabetes mellitus without complication ([facility]  Essential hypertension   Hyperlipidemia   Thyroid nodule   Anemia   Acute-on-chronic kidney injury ([facility] Brief HPI:   Stacey Lucas a [age].o. female with history of uncontrolled T2DM with neuropathy/RLE weakness, CVA 09/22, thyroid nodule, depression, ongoing cocaine abuse who was admitted on 12/29/2021 with left-sided weakness, left facial weakness and slurred speech which was transient.  She then developed near flaccidity of R UE, RLE greater than LLE drift, slurred speech and right facial droop while in ED.  UDS was positive for cocaine.  CTA head neck was negative for LVO.  Blood pressure noted to be elevated and was treated with IV labetalol and an Cleviprex.  She received TNK and follow-up MRI brain showed acute infarct in left corona  radiata, basal ganglia and chronic small vessel disease.  2D echo showed EF of 70 to 75% with mild LVH.     Follow-up TEE was negative for intra-atrial shunt, thrombus and negative bubble study.  EEG was negative for seizures.  Hospital course was significant for acute on chronic renal failure with rise in serum creatinine to 1.66.  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 SeLeonie Manecommended DAPT x3 weeks followed by aspirin alone.  She continues to be limited by right-sided weakness with left inattention, right lean as well as right knee instability with standing attempts.  CIR was recommended due to functional decline.     Hospital Course: Stacey Kingsburyas admitted to rehab 01/02/2022 for inpatient therapies to consist of PT, ST and OT at least three hours five days a week. Past admission physiatrist, therapy team and rehab RN have worked together to provide customized collaborative inpatient rehab.  Her blood pressures were monitored on TID basis and have been well controlled.  Serial check of electrolytes showed acute on chronic chronic renal failure therefore Avapro was just decreased to 75 amlodipine was added and titrated to 10 mg/day.  She has had recurrent rise in his serum  creatinine was advised to increase p.o. fluids recommendations to have repeat labs monitoring of renal status.  Has been stable and she has shown good participation with therapy.  She was started on bupropion to help with anxiety as well as quit smoking.  Gabapentin for $00 mg twice daily was added to assist with cocaine cessation however patient was unable to tolerate this due to extreme fatigue/balance issues therefore this was discontinued   Follow-up CBC showed H&H to be stable without signs of bleeding.  She was maintained on DAPT during her stay after 3 days.  Her diabetes has been monitored with ac/hs CBG checks and SSI was use prn for tighter BS control.  Low-dose Amaryl was added due to CKD in  addition to insulin glargine 5 units at bedtime.  Her p.o. intake has been good and blood sugars are relatively controlled but will need further adjustment on outpatient basis. Patient and fiancee have been educated on effects of tobacco, marijuana/cocaine on brain and it's stroke risk as well as importance of medical/medication compliance. She has been set up with Dr. Wynetta Emery for primary care follow up.  She has made good gains during her rehab stay and requires intermittent supervision for mobility.  She will continue to receive follow-up outpatient PT and OT at [facility] neuro rehab after discharge.     Rehab course: During patient's stay in rehab weekly team conferences were held to monitor patient's progress, set goals and discuss barriers to discharge. At admission, patient required min to mod assist with ADL tasks and mod assist with mobility. She exhibited mild cognitive impairments with acute mild dysarthria and deficits.  She  has had improvement in activity tolerance, balance, postural control as well as ability to compensate for deficits.  She has had improvement in functional use RUE  and RLE as well as improvement in awareness. She is able to complete ADL tasks with CGA to min assist. She is modified independent for transfers and to ambulate 150' with RW.  She requires CGA to climb 12 stairs. Speech is intelligible and she is able to complete cognitive tasks with repetition as well as extra time for processing. Family education was completed.    Discharge disposition: 01-Home or Self Care  Below is the last rehab visit outpatient which occurred May 25 1. Functional deficits secondary to left corona radiata infarct. Cocaine related vasculopathy             -Continue outpatient PT/OT            -Order placed for shower bench             -Home health aid requested, order placed but she does not qualify at this time due to lack of skilled home need     2. HTN well-controlled today -continue  Avapro and metoprolol -Metoprolol was refilled, follow-up with primary care physician     3. T2DM -Continue current medications, glucometer was ordered     4.  Polysubstance use with history of tobacco abuse , cocaine use -Continue buproprion, congratulated her on not smoking tobacco or using other illicit substances   5.  Right knee pain -Suspect patellofemoral syndrome, Pt to work on this with PT,  Can use knee brace and continue Voltaren gel   Below is a phone note from rehab 6/22 phone note: Ms Dave's fiance showed up in the office stating she does not have appt with PCP until 03/05/22 and they did not get her amlodipine refill and the plavix.  I have reviewed chart and the refill on the plavix was sent in but only 4 tablets (she had 2 separate rx from [place]). I have refilled the amlodipine 30 d x1 and the plavix 30 d x1 with no further refills from our office. She no showed  her scheduled appt with PCP 02/19/22 and it has been rescheduled to 03/05/22 with Asencion Noble MD. She has not seen Dr Leonie Man and no appt scheduled. I reviewed the referral and it looks like the [place] office has been unable to reach the patient to set the appt. I reprinted the AVS from the inpatient discharge and highlighted [facility] address and phone # for them to call when he gets home!!! (she was on the phone speaker while he was here). I also highlighted the PCP appt and reinforced they cannot miss that appointment for we can not refill her meds again. They both verbalized understanding.   There is a history of smoking patient is smoking less at this time she was on bupropion for this also given Celexa for depression she states her depression is at baselineAnd is stable  8/10 This patient is seen in return follow-up from stroke symptoms she is yet to see neurology.  She has run out of her clopidogrel and is not taking it currently.  The plan was to stop after 3 months however when she did stop the  medicine she noted increased weakness in the right arm and leg.  She is still using the walker.  She is completed her rehab.  She was followed by physical medicine. Patient states she gets cold in the right arm and right lower extremity at times.  On arrival blood pressure is well-controlled 118/70.  She maintains I[place], amlodipine, and metoprolol.  Patient also maintains low-dose aspirin and Crestor.  Patient does maintain low-dose insulin glargine 5 units daily however she has not picked up a recent refill of this.  For her diabetes she maintains Amaryl alone.  The patient does not monitor her blood sugars on a regular basis.  On arrival blood sugar is 259 The patient receive the Cologuard kit but could not process that she is having too many loose stools Outpatient Encounter Medications as of 04/18/2022  Medication Sig   ACCU-CHEK GUIDE test strip USE UP TO FOUR TIMES DAILY AS DIRECTED   Accu-Chek Softclix Lancets lancets SMARTSIG:Topical 1-4 Times Daily   blood glucose meter kit and supplies KIT Dispense based on patient and insurance preference. Use up to four times daily as directed.   [DISCONTINUED] amLODipine (NORVASC) 10 MG tablet Take 1 tablet (10 mg total) by mouth daily.   [DISCONTINUED] aspirin 81 MG EC tablet Take 1 tablet (81 mg total) by mouth daily. Swallow whole.   [DISCONTINUED] citalopram (CELEXA) 10 MG tablet Take 1 tablet (10 mg total) by mouth daily.   [DISCONTINUED] clopidogrel (PLAVIX) 75 MG tablet TAKE 1 TABLET(75 MG) BY MOUTH DAILY   [DISCONTINUED] diclofenac Sodium (VOLTAREN) 1 % GEL Apply 4 g topically daily as needed (For knee pain).   [DISCONTINUED] Insulin Pen Needle 32G X 4 MM MISC Use to inject insulin at bed time   [DISCONTINUED] irbesartan (AVAPRO) 75 MG tablet Take 1 tablet (75 mg total) by mouth daily.   [DISCONTINUED] meloxicam (MOBIC) 15 MG tablet Take 1 tablet (15 mg total) by mouth daily.   [DISCONTINUED] metoprolol succinate (TOPROL-XL) 25 MG 24 hr  tablet Take 3 tablets (75 mg total) by mouth daily.   [DISCONTINUED] rosuvastatin (  CRESTOR) 20 MG tablet Take 1 tablet (20 mg total) by mouth at bedtime.   amLODipine (NORVASC) 10 MG tablet Take 1 tablet (10 mg total) by mouth daily.   aspirin EC 81 MG tablet Take 1 tablet (81 mg total) by mouth daily. Swallow whole.   buPROPion (WELLBUTRIN XL) 150 MG 24 hr tablet TAKE 1 TABLET(150 MG) BY MOUTH DAILY   citalopram (CELEXA) 10 MG tablet Take 1 tablet (10 mg total) by mouth daily.   clopidogrel (PLAVIX) 75 MG tablet TAKE 1 TABLET(75 MG) BY MOUTH DAILY   diclofenac Sodium (VOLTAREN) 1 % GEL Apply 4 g topically daily as needed (For knee pain).   dicyclomine (BENTYL) 10 MG capsule Take 1 capsule (10 mg total) by mouth 4 (four) times daily -  before meals and at bedtime. (Patient not taking: Reported on 04/18/2022)   glimepiride (AMARYL) 2 MG tablet Take 1 tablet (2 mg total) by mouth daily with breakfast.   Insulin Glargine (BASAGLAR KWIKPEN) 100 UNIT/ML Inject 5 Units into the skin daily.   Insulin Pen Needle 32G X 4 MM MISC Use to inject insulin at bed time   irbesartan (AVAPRO) 75 MG tablet Take 1 tablet (75 mg total) by mouth daily.   meloxicam (MOBIC) 15 MG tablet Take 1 tablet (15 mg total) by mouth daily.   metoprolol succinate (TOPROL-XL) 25 MG 24 hr tablet Take 3 tablets (75 mg total) by mouth daily.   rosuvastatin (CRESTOR) 20 MG tablet Take 1 tablet (20 mg total) by mouth at bedtime.   [DISCONTINUED] buPROPion (WELLBUTRIN XL) 150 MG 24 hr tablet TAKE 1 TABLET(150 MG) BY MOUTH DAILY (Patient not taking: Reported on 04/18/2022)   [DISCONTINUED] glimepiride (AMARYL) 2 MG tablet Take 1 tablet (2 mg total) by mouth daily with breakfast. (Patient not taking: Reported on 04/18/2022)   [DISCONTINUED] Insulin Glargine (BASAGLAR KWIKPEN) 100 UNIT/ML Inject 5 Units into the skin daily. (Patient not taking: Reported on 04/18/2022)   [DISCONTINUED] loperamide (IMODIUM) 2 MG capsule Take 1 capsule (2 mg total)  by mouth every 6 (six) hours as needed for diarrhea or loose stools. (Patient not taking: Reported on 04/18/2022)   [DISCONTINUED] pantoprazole (PROTONIX) 40 MG tablet TAKE 1 TABLET(40 MG) BY MOUTH AT BEDTIME (Patient not taking: Reported on 04/18/2022)   [DISCONTINUED] pantoprazole (PROTONIX) 40 MG tablet TAKE 1 TABLET(40 MG) BY MOUTH AT BEDTIME (Patient not taking: Reported on 04/18/2022)   No facility-administered encounter medications on file as of 04/18/2022.    Past Medical History:  Diagnosis Date   Anxiety and depression    Cocaine abuse ([place])    Depression with suicidal ideation    [place] admission 04/2018   Diabetes mellitus without complication (HCC)    Type II   GERD (gastroesophageal reflux disease)    Hyperlipidemia    Hypertension    Impingement syndrome of right shoulder    Migraine headache    Osteoarthritis of right knee 05/24/2021   Pilonidal abscess 05/2013   Pyelonephritis    Right thyroid nodule    Sciatica     Past Surgical History:  Procedure Laterality Date   BUBBLE STUDY  01/01/2022   Procedure: BUBBLE STUDY;  Surgeon: Janina Mayo, MD;  Location: [facility];  Service: Cardiovascular;;   TEE WITHOUT CARDIOVERSION N/A 01/01/2022   Procedure: TRANSESOPHAGEAL ECHOCARDIOGRAM (TEE);  Surgeon: Janina Mayo, MD;  Location: [facility] ENDOSCOPY;  Service: Cardiovascular;  Laterality: N/A;   tubal      Family History  Problem Relation Age of  Onset   Diabetes Mother    Hypertension Mother    Arthritis Mother    Diabetes Father    Hypertension Father     Social History   Socioeconomic History   Marital status: Single    Spouse name: Not on file   Number of children: 3   Years of education: Not on file   Highest education level: Not on file  Occupational History   Not on file  Tobacco Use   Smoking status: Former    Packs/day: 1.00    Types: Cigarettes    Quit date: 01/16/2022    Years since quitting: 0.2   Smokeless tobacco: Never  Vaping Use   Vaping  Use: Never used  Substance and Sexual Activity   Alcohol use: Yes    Comment: occasionally   Drug use: No   Sexual activity: Yes    Birth control/protection: None  Other Topics Concern   Not on file  Social History Narrative   Lives with children   "lots of caffeine"   Social Determinants of Health   Financial Resource Strain: Not on file  Food Insecurity: Not on file  Transportation Needs: Not on file  Physical Activity: Not on file  Stress: Not on file  Social Connections: Not on file  Intimate Partner Violence: Not on file    Review of Systems  Constitutional:  Negative for chills, diaphoresis, fever, malaise/fatigue and weight loss.  HENT:  Negative for congestion, hearing loss, nosebleeds, sore throat and tinnitus.   Eyes:  Negative for blurred vision, photophobia and redness.  Respiratory:  Negative for cough, hemoptysis, sputum production, shortness of breath, wheezing and stridor.   Cardiovascular:  Negative for chest pain, palpitations, orthopnea, claudication, leg swelling and PND.  Gastrointestinal:  Negative for abdominal pain, blood in stool, constipation, diarrhea, heartburn, nausea and vomiting.  Genitourinary:  Negative for dysuria, flank pain, frequency, hematuria and urgency.  Musculoskeletal:  Positive for joint pain. Negative for back pain, falls, myalgias and neck pain.       Gait difficulty  Skin:  Negative for itching and rash.  Neurological:  Positive for tingling, sensory change, focal weakness and weakness. Negative for dizziness, tremors, speech change, seizures, loss of consciousness and headaches.  Endo/Heme/Allergies:  Negative for environmental allergies and polydipsia. Does not bruise/bleed easily.  Psychiatric/Behavioral: Negative.  Negative for depression, memory loss, substance abuse and suicidal ideas. The patient is not nervous/anxious and does not have insomnia.         Objective    BP 118/70 (BP Location: Left Arm, Patient Position:  Sitting, Cuff Size: Normal)   Pulse 71   Temp 98.2 F (36.8 C)   Resp 12   Ht _0  (1.651 m)   Wt 138 lb (62.6 kg)   SpO2 100%   BMI 22.96 kg/m   Physical Exam Vitals reviewed.  Constitutional:      Appearance: Normal appearance. She is well-developed and normal weight. She is not diaphoretic.  HENT:     Head: Normocephalic and atraumatic.     Nose: No nasal deformity, septal deviation, mucosal edema or rhinorrhea.     Right Sinus: No maxillary sinus tenderness or frontal sinus tenderness.     Left Sinus: No maxillary sinus tenderness or frontal sinus tenderness.     Mouth/Throat:     Pharynx: No oropharyngeal exudate.  Eyes:     General: No scleral icterus.    Conjunctiva/sclera: Conjunctivae normal.     Pupils: Pupils are equal, round,  and reactive to light.  Neck:     Thyroid: No thyromegaly.     Vascular: No carotid bruit or JVD.     Trachea: Trachea normal. No tracheal tenderness or tracheal deviation.  Cardiovascular:     Rate and Rhythm: Normal rate and regular rhythm.     Chest Wall: PMI is not displaced.     Pulses: Normal pulses. No decreased pulses.     Heart sounds: Normal heart sounds, S1 normal and S2 normal. Heart sounds not distant. No murmur heard.    No systolic murmur is present.     No diastolic murmur is present.     No friction rub. No gallop. No S3 or S4 sounds.  Pulmonary:     Effort: No tachypnea, accessory muscle usage or respiratory distress.     Breath sounds: No stridor. No decreased breath sounds, wheezing, rhonchi or rales.  Chest:     Chest wall: No tenderness.  Abdominal:     General: Bowel sounds are normal. There is no distension.     Palpations: Abdomen is soft. Abdomen is not rigid.     Tenderness: There is no abdominal tenderness. There is no guarding or rebound.  Musculoskeletal:        General: Normal range of motion.     Cervical back: Normal range of motion and neck supple. No edema, erythema or rigidity. No muscular  tenderness. Normal range of motion.     Comments: Foot exam normal  Lymphadenopathy:     Head:     Right side of head: No submental or submandibular adenopathy.     Left side of head: No submental or submandibular adenopathy.     Cervical: No cervical adenopathy.  Skin:    General: Skin is warm and dry.     Coloration: Skin is not pale.     Findings: No rash.     Nails: There is no clubbing.  Neurological:     Mental Status: She is alert and oriented to person, place, and time. Mental status is at baseline.     Cranial Nerves: No cranial nerve deficit.     Sensory: Sensory deficit present.     Motor: Weakness present.     Coordination: Coordination abnormal.     Gait: Gait abnormal.     Deep Tendon Reflexes: Reflexes normal.  Psychiatric:        Mood and Affect: Mood normal.        Speech: Speech normal.        Behavior: Behavior normal.        Thought Content: Thought content normal.        Judgment: Judgment normal.         SIGNIFICANT DIAGNOSTIC STUDIES  Imaging Results  CT HEAD WO CONTRAST (5MM)   Result Date: 12/31/2021 CLINICAL DATA:  Stroke, follow-up EXAM: CT HEAD WITHOUT CONTRAST TECHNIQUE: Contiguous axial images were obtained from the base of the skull through the vertex without intravenous contrast. RADIATION DOSE REDUCTION: This exam was performed according to the departmental dose-optimization program which includes automated exposure control, adjustment of the mA and/or kV according to patient size and/or use of iterative reconstruction technique. COMPARISON:  12/29/2021 FINDINGS: Brain: Evolving recent infarction involving the left corona radiata and basal ganglia. Chronic bilateral basal ganglia infarcts. Additional patchy hypoattenuation in the supratentorial white matter likely reflects nonspecific gliosis/demyelination. No acute intracranial hemorrhage or mass effect. Ventricles are normal in size. No extra-axial collection. Vascular: There is mild  atherosclerotic calcification  at the skull base. Skull: Calvarium is unremarkable. Sinuses/Orbits: No acute finding. Other: None. IMPRESSION: Evolving recent infarction of left corona radiata and basal ganglia. No acute intracranial hemorrhage. Chronic infarcts and chronic microvascular ischemic changes. Electronically Signed   By: Macy Mis M.D.   On: 12/31/2021 10:52    CT HEAD WO CONTRAST (5MM)   Result Date: 12/08/2021 CLINICAL DATA:  Headache, chronic, no new features EXAM: CT HEAD WITHOUT CONTRAST TECHNIQUE: Contiguous axial images were obtained from the base of the skull through the vertex without intravenous contrast. RADIATION DOSE REDUCTION: This exam was performed according to the departmental dose-optimization program which includes automated exposure control, adjustment of the mA and/or kV according to patient size and/or use of iterative reconstruction technique. COMPARISON:  05/23/2021 FINDINGS: Brain: No evidence of acute infarction, hemorrhage, hydrocephalus, extra-axial collection or mass lesion/mass effect. Chronic bilateral basal ganglia lacunar infarcts. Mild scattered low-density changes within the periventricular and subcortical white matter compatible with chronic microvascular ischemic change. Vascular: No hyperdense vessel or unexpected calcification. Skull: Normal. Negative for fracture or focal lesion. Sinuses/Orbits: No acute finding. Other: None. IMPRESSION: 1. No acute intracranial abnormality. 2. Mild chronic microvascular ischemic change. Electronically Signed   By: Davina Poke D.O.   On: 12/08/2021 13:46    MR BRAIN WO CONTRAST   Result Date: 12/30/2021 CLINICAL DATA:  Provided history: Stroke, follow-up. EXAM: MRI HEAD WITHOUT CONTRAST TECHNIQUE: Multiplanar, multiecho pulse sequences of the brain and surrounding structures were obtained without intravenous contrast. COMPARISON:  CT angiogram head/neck 12/29/2021. Noncontrast head CT 12/29/2021. Brain MRI  05/23/2021. FINDINGS: Brain: The patient was unable to tolerate the full examination. As a result, axial T1 weighted and coronal T2 TSE sequences could not be obtained. The acquired sequences are intermittently motion degraded. Most notably, there is severe motion degradation of the axial SWI sequence. No age advanced or lobar predominant parenchymal atrophy. 2.4 x 1.1 x 2.0 cm acute infarct within the left corona radiata/basal ganglia. Some residual diffusion weighted hyperintensity at site of a chronic infarct within the right corona radiata. Chronic lacunar infarcts within the bilateral corona radiata/deep gray nuclei. Moderate (and significantly advanced for age) multifocal T2 FLAIR hyperintense signal abnormality elsewhere within the cerebral white matter, nonspecific but likely reflecting chronic small vessel ischemic disease given the patient's history of hypertension, diabetes and hyperlipidemia. Mild chronic small-vessel ischemic changes are also present within the pons No evidence of an intracranial mass. No extra-axial fluid collection. No midline shift. Vascular: Maintained flow voids within the proximal large arterial vessels. Skull and upper cervical spine: No focal suspicious marrow lesion. Sinuses/Orbits: Visualized orbits show no acute finding. Minimal scattered paranasal sinus mucosal thickening. IMPRESSION: Prematurely terminated and motion degraded examination. 2.4 x 1.1 x 2.0 cm acute infarct within the left corona radiata/basal ganglia. Superimposed chronic small-vessel infarcts within the bilateral corona radiata/basal ganglia. Background moderate cerebral white matter chronic small vessel ischemic disease. Mild chronic small vessel ischemic changes are also present within the pons. Electronically Signed   By: Kellie Simmering D.O.   On: 12/30/2021 13:20    DG CHEST PORT 1 VIEW   Result Date: 12/29/2021 CLINICAL DATA:  CVA EXAM: PORTABLE CHEST 1 VIEW COMPARISON:  06/18/2018 chest radiograph  FINDINGS: The cardiomediastinal silhouette is unremarkable. There is no evidence of focal airspace disease, pulmonary edema, suspicious pulmonary nodule/mass, pleural effusion, or pneumothorax. No acute bony abnormalities are identified. IMPRESSION: No active disease. Electronically Signed   By: Margarette Canada M.D.   On: 12/29/2021 16:36  EEG adult   Result Date: 12/31/2021 Lora Havens, MD     12/31/2021  2:52 PM Patient Name: Stacey Lucas MRN: [id number] Epilepsy Attending: Lora Havens Referring Physician/Provider: Garvin Fila, MD Date: 12/31/2021 Duration: 21.53 mins Patient history: 46 year old female with acute left corona radiata/basal ganglia stroke.  EEG evaluate for seizure. Level of alertness: Awake AEDs during EEG study: None Technical aspects: This EEG study was done with scalp electrodes positioned according to the 10-20 International system of electrode placement. Electrical activity was acquired at a sampling rate of _0  and reviewed with a high frequency filter of _1  and a low frequency filter of _2 . EEG data were recorded continuously and digitally stored. Description: The posterior dominant rhythm consists of 9 Hz activity of moderate voltage (25-35 uV) seen predominantly in posterior head regions, symmetric and reactive to eye opening and eye closing. Hyperventilation and photic stimulation were not performed.   IMPRESSION: This study is within normal limits. No seizures or epileptiform discharges were seen throughout the recording. Priyanka O Yadav    VAS [place] TRANSCRANIAL DOPPLER W BUBBLES   Result Date: 01/01/2022  Transcranial Doppler with Bubble Patient Name:  Stacey Lucas  Date of Exam:   12/31/2021 Medical Rec #: [id number]     Accession #:    [id number] Date of Birth: 09/29/1975    Patient Gender: F Patient Age:   12 years Exam Location:  [facility] Procedure:      VAS [place] TRANSCRANIAL DOPPLER W BUBBLES Referring Phys: Janine Ores  --------------------------------------------------------------------------------  Indications: Stroke. Comparison Study: No priors. Performing Technologist: Darlin Coco RDMS, RVT  Examination Guidelines: A complete evaluation includes B-mode imaging, spectral Doppler, color Doppler, and power Doppler as needed of all accessible portions of each vessel. Bilateral testing is considered an integral part of a complete examination. Limited examinations for reoccurring indications may be performed as noted.  Summary: No HITS at rest or during Valsalva. Negative transcranial Doppler Bubble study with no evidence of right to left intracardiac communication.  A vascular evaluation was performed. The right middle cerebral artery was studied. An IV was inserted into the patient's left forearm. Verbal informed consent was obtained.  NEGATIVE TCD Bubble study with no evidence of right to left shunt noted. *See table(s) above for TCD measurements and observations.  Diagnosing physician: Stacey Contras MD Electronically signed by Stacey Contras MD on 01/01/2022 at 10:54:57 AM.    Final     ECHOCARDIOGRAM COMPLETE   Result Date: 12/30/2021    ECHOCARDIOGRAM REPORT   Patient Name:   Stacey Lucas Date of Exam: 12/30/2021 Medical Rec #:  [id number]    Height:       65.0 in Accession #:    [id number]   Weight:       153.0 lb Date of Birth:  Apr 30, 1976   BSA:          1.765 m Patient Age:    57 years     BP:           154/100 mmHg Patient Gender: F            HR:           78 bpm. Exam Location:  Inpatient Procedure: 2D Echo Indications:    stroke  History:        Patient has prior history of Echocardiogram examinations, most                 recent 05/23/2021. Arrythmias:Atrial Fibrillation; Risk  Factors:Diabetes.  Sonographer:    Johny Chess RDCS Referring Phys: [id number] ASHISH ARORA IMPRESSIONS  1. Left ventricular ejection fraction, by estimation, is 70 to 75%. Left ventricular ejection fraction by PLAX is 71 %.  The left ventricle has hyperdynamic function. The left ventricle has no regional wall motion abnormalities. There is mild left ventricular hypertrophy. Left ventricular diastolic parameters are consistent with Grade I diastolic dysfunction (impaired relaxation).  2. Right ventricular systolic function is normal. The right ventricular size is normal. Tricuspid regurgitation signal is inadequate for assessing PA pressure.  3. The mitral valve is grossly normal. No evidence of mitral valve regurgitation.  4. The aortic valve is tricuspid. Aortic valve regurgitation is not visualized.  5. The inferior vena cava is normal in size with <50% respiratory variability, suggesting right atrial pressure of 8 mmHg. Comparison(s): Changes from prior study are noted. 05/23/2021: LVEF 60-65%, mild LVH, normal diastolic function. FINDINGS  Left Ventricle: Left ventricular ejection fraction, by estimation, is 70 to 75%. Left ventricular ejection fraction by PLAX is 71 %. The left ventricle has hyperdynamic function. The left ventricle has no regional wall motion abnormalities. The left ventricular internal cavity size was normal in size. There is mild left ventricular hypertrophy. Left ventricular diastolic parameters are consistent with Grade I diastolic dysfunction (impaired relaxation). Indeterminate filling pressures. Right Ventricle: The right ventricular size is normal. No increase in right ventricular wall thickness. Right ventricular systolic function is normal. Tricuspid regurgitation signal is inadequate for assessing PA pressure. Left Atrium: Left atrial size was normal in size. Right Atrium: Right atrial size was normal in size. Pericardium: There is no evidence of pericardial effusion. Mitral Valve: The mitral valve is grossly normal. No evidence of mitral valve regurgitation. Tricuspid Valve: The tricuspid valve is normal in structure. Tricuspid valve regurgitation is not demonstrated. Aortic Valve: The aortic valve is  tricuspid. Aortic valve regurgitation is not visualized. Pulmonic Valve: The pulmonic valve was normal in structure. Pulmonic valve regurgitation is not visualized. Aorta: The aortic root and ascending aorta are structurally normal, with no evidence of dilitation. Venous: The inferior vena cava is normal in size with less than 50% respiratory variability, suggesting right atrial pressure of 8 mmHg. IAS/Shunts: No atrial level shunt detected by color flow Doppler.  LEFT VENTRICLE PLAX 2D LV EF:         Left            Diastology                ventricular     LV e' medial:    6.74 cm/s                ejection        LV E/e' medial:  15.4                fraction by     LV e' lateral:   7.07 cm/s                PLAX is 71      LV E/e' lateral: 14.7                %. LVIDd:         4.50 cm LVIDs:         2.70 cm LV PW:         1.20 cm LV IVS:        0.90 cm LVOT diam:     2.00 cm LV SV:  56 LV SV Index:   32 LVOT Area:     3.14 cm  RIGHT VENTRICLE             IVC RV S prime:     10.40 cm/s  IVC diam: 1.90 cm TAPSE (M-mode): 2.0 cm LEFT ATRIUM             Index        RIGHT ATRIUM           Index LA diam:        3.50 cm 1.98 cm/m   RA Area:     10.50 cm LA Vol (A2C):   47.0 ml 26.62 ml/m  RA Volume:   20.60 ml  11.67 ml/m LA Vol (A4C):   44.5 ml 25.21 ml/m LA Biplane Vol: 46.8 ml 26.51 ml/m  AORTIC VALVE LVOT Vmax:   85.20 cm/s LVOT Vmean:  58.200 cm/s LVOT VTI:    0.179 m  AORTA Ao Root diam: 2.60 cm Ao Asc diam:  2.70 cm MITRAL VALVE MV Area (PHT): 3.99 cm     SHUNTS MV Decel Time: 190 msec     Systemic VTI:  0.18 m MV E velocity: 104.00 cm/s  Systemic Diam: 2.00 cm MV A velocity: 116.00 cm/s MV E/A ratio:  0.90 Lyman Bishop MD Electronically signed by Lyman Bishop MD Signature Date/Time: 12/30/2021/12:23:51 PM    Final     ECHO TEE   Result Date: 01/01/2022    TRANSESOPHOGEAL ECHO REPORT   Patient Name:   Stacey Lucas Date of Exam: 01/01/2022 Medical Rec #:  [id number]    Height:       65.0 in  Accession #:    [id number]   Weight:       153.0 lb Date of Birth:  09/28/1975   BSA:          1.765 m Patient Age:    72 years     BP:           172/80 mmHg Patient Gender: F            HR:           87 bpm. Exam Location:  Inpatient Procedure: Transesophageal Echo, Cardiac Doppler, Color Doppler and Saline            Contrast Bubble Study Indications:     Stroke  History:         Patient has prior history of Echocardiogram examinations, most                  recent 12/30/2021. Arrythmias:Atrial Fibrillation; Risk                  Factors:Diabetes, Hypertension, Dyslipidemia and Current                  Smoker.  Sonographer:     Clayton Lefort RDCS (AE) Referring Phys:  [place] Diagnosing Phys: [place] PROCEDURE: After discussion of the risks and benefits of a TEE, an informed consent was obtained from the patient. The transesophogeal probe was passed without difficulty through the esophogus of the patient. Sedation performed by different physician. The patient was monitored while under deep sedation. Anesthestetic sedation was provided intravenously by Anesthesiology: 124.40m of Propofol, 647mof Lidocaine. Image quality was good. The patient developed no complications during the procedure. IMPRESSIONS  1. Left ventricular ejection fraction, by estimation, is 60 to 65%. The left ventricle has normal function.  2. Right ventricular  systolic function is normal. The right ventricular size is normal.  3. No left atrial/left atrial appendage thrombus was detected. The LAA emptying velocity was 60 cm/s.  4. The mitral valve is normal in structure. No evidence of mitral valve regurgitation.  5. The aortic valve is normal in structure. Aortic valve regurgitation is not visualized.  6. There is mild (Grade II) plaque.  7. Agitated saline contrast bubble study was negative, with no evidence of any interatrial shunt. Conclusion(s)/Recommendation(s): No LA/LAA thrombus identified. Negative bubble study for  interatrial shunt. No intracardiac source of embolism detected on this on this transesophageal echocardiogram. FINDINGS  Left Ventricle: Left ventricular ejection fraction, by estimation, is 60 to 65%. The left ventricle has normal function. The left ventricular internal cavity size was normal in size. Right Ventricle: The right ventricular size is normal. No increase in right ventricular wall thickness. Right ventricular systolic function is normal. Left Atrium: No left atrial/left atrial appendage thrombus was detected. The LAA emptying velocity was 60 cm/s. Pericardium: There is no evidence of pericardial effusion. Mitral Valve: The mitral valve is normal in structure. No evidence of mitral valve regurgitation. Tricuspid Valve: The tricuspid valve is normal in structure. Tricuspid valve regurgitation is not demonstrated. Aortic Valve: The aortic valve is normal in structure. Aortic valve regurgitation is not visualized. Pulmonic Valve: The pulmonic valve was normal in structure. Pulmonic valve regurgitation is not visualized. Aorta: The aortic root and ascending aorta are structurally normal, with no evidence of dilitation. There is mild (Grade II) plaque. IAS/Shunts: Agitated saline contrast was given intravenously to evaluate for intracardiac shunting. Agitated saline contrast bubble study was negative, with no evidence of any interatrial shunt. Phineas Inches Electronically signed by Phineas Inches Signature Date/Time: 01/01/2022/11:57:28 AM    Final     CT HEAD CODE STROKE WO CONTRAST   Result Date: 12/29/2021 CLINICAL DATA:  Code stroke.  Left facial droop and slurred speech EXAM: CT HEAD WITHOUT CONTRAST TECHNIQUE: Contiguous axial images were obtained from the base of the skull through the vertex without intravenous contrast. RADIATION DOSE REDUCTION: This exam was performed according to the departmental dose-optimization program which includes automated exposure control, adjustment of the mA and/or kV  according to patient size and/or use of iterative reconstruction technique. COMPARISON:  12/08/2021 FINDINGS: Brain: No evidence of acute infarction, hemorrhage, hydrocephalus, extra-axial collection or mass lesion/mass effect. Chronic small vessel ischemic gliosis in the cerebral white matter. Chronic perforator infarct at the left basal ganglia. Vascular: No hyperdense vessel or unexpected calcification. Skull: Normal. Negative for fracture or focal lesion. Sinuses/Orbits: No acute finding. Other: These results were communicated to Dr. Rory Percy at 12:36 pm on 12/29/2021 by text page via the [facility] messaging system. ASPECTS [facility] Stroke Program Early CT Score) - Ganglionic level infarction (caudate, lentiform nuclei, internal capsule, insula, M1-M3 cortex): 7 - Supraganglionic infarction (M4-M6 cortex): 3 Total score (0-10 with 10 being normal): 10 IMPRESSION: 1. No acute finding. 2. Premature chronic small vessel disease. Electronically Signed   By: Jorje Guild M.D.   On: 12/29/2021 12:36    CT ANGIO HEAD NECK W WO CM (CODE STROKE)   Result Date: 12/29/2021 CLINICAL DATA:  Provided history: Neuro deficit, acute, stroke suspected. Left-sided weakness. Slurred speech. EXAM: CT ANGIOGRAPHY HEAD AND NECK TECHNIQUE: Multidetector CT imaging of the head and neck was performed using the standard protocol during bolus administration of intravenous contrast. Multiplanar CT image reconstructions and MIPs were obtained to evaluate the vascular anatomy. Carotid stenosis measurements (when applicable) are  obtained utilizing NASCET criteria, using the distal internal carotid diameter as the denominator. RADIATION DOSE REDUCTION: This exam was performed according to the departmental dose-optimization program which includes automated exposure control, adjustment of the mA and/or kV according to patient size and/or use of iterative reconstruction technique. CONTRAST:  144m OMNIPAQUE IOHEXOL 350 MG/ML SOLN COMPARISON:   Noncontrast head CT performed earlier today 12/29/2021. Brain MRI 05/23/2021. CT angiogram head/neck 05/23/2021. FINDINGS: CTA NECK FINDINGS Aortic arch: The visualized aortic arch is normal in caliber. Streak and beam hardening artifact arising from a dense left-sided contrast bolus partially obscures the innominate artery. Within this limitation, there is no appreciable hemodynamically significant innominate or proximal subclavian artery stenosis. Right carotid system: CCA and ICA patent within the neck without stenosis or significant atherosclerotic disease. Left carotid system: CCA and ICA patent within the neck without stenosis or significant atherosclerotic disease. Vertebral arteries: Vertebral arteries codominant and patent within the neck without stenosis or significant atherosclerotic disease. Skeleton: Cervical spondylosis. No acute bony abnormality or aggressive osseous lesion. Other neck: 1.8 cm right thyroid lobe nodule. Upper chest: No consolidation within the imaged lung apices. Review of the MIP images confirms the above findings CTA HEAD FINDINGS Anterior circulation: The intracranial internal carotid arteries are patent. The M1 middle cerebral arteries are patent. No no M2 proximal branch occlusion is identified. Atherosclerotic irregularity of the M2 and more distal MCA vessels, bilaterally. Most notably, there is an apparent moderate/severe focal stenosis within a superior division mid M2 left MCA vessel (series 12, image 25). The anterior cerebral arteries are patent. No intracranial aneurysm is identified. Posterior circulation: The intracranial vertebral arteries are patent. The basilar artery is patent. The posterior cerebral arteries are patent. Posterior communicating arteries are diminutive or absent bilaterally. Venous sinuses: Within the limitations of contrast timing, no convincing thrombus. Anatomic variants: As described. Review of the MIP images confirms the above findings No  emergent large vessel occlusion identified. These results were communicated to Dr. ARory Percyat 12:55 pmon 4/22/2023by text page via the [facility]. IMPRESSION: CTA neck: 1. The common carotid, internal carotid and vertebral arteries are patent within the neck without stenosis. 2. 1.8 cm right thyroid lobe nodule. A non-emergent thyroid ultrasound is recommended for further evaluation. CTA head: 1. No intracranial large vessel occlusion is identified. 2. Atherosclerotic irregularity of the M2 and more distal MCA vessels, bilaterally. Most notably, there is an apparent moderate/severe focal stenosis within a superior division mid M2 left MCA vessel. Electronically Signed   By: KKellie SimmeringD.O.   On: 12/29/2021 12:59     Assessment & Plan:   Problem List Items Addressed This Visit       Cardiovascular and Mediastinum   Essential hypertension    Blood pressure at goal no change in medications refills issued      Relevant Medications   amLODipine (NORVASC) 10 MG tablet   aspirin EC 81 MG tablet   irbesartan (AVAPRO) 75 MG tablet   metoprolol succinate (TOPROL-XL) 25 MG 24 hr tablet   rosuvastatin (CRESTOR) 20 MG tablet   Cerebral thrombosis with cerebral infarction   Relevant Medications   amLODipine (NORVASC) 10 MG tablet   aspirin EC 81 MG tablet   irbesartan (AVAPRO) 75 MG tablet   metoprolol succinate (TOPROL-XL) 25 MG 24 hr tablet   rosuvastatin (CRESTOR) 20 MG tablet   Other Relevant Orders   Ambulatory referral to Neurology   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  s/p TNKase    I have resumed the patient's clopidogrel she is to take this daily along with aspirin until we can get her into neurology for further assessments  Patient to maintain low-dose Crestor  Referral back to neurology was made        Relevant Medications   amLODipine (NORVASC) 10 MG tablet   aspirin EC 81 MG tablet   irbesartan (AVAPRO) 75  MG tablet   metoprolol succinate (TOPROL-XL) 25 MG 24 hr tablet   rosuvastatin (CRESTOR) 20 MG tablet     Endocrine   Type 2 diabetes mellitus without complication ([place]) - Primary    Poorly controlled diabetes referral back to clinical pharmacy made continue insulin and Amaryl      Relevant Medications   aspirin EC 81 MG tablet   irbesartan (AVAPRO) 75 MG tablet   rosuvastatin (CRESTOR) 20 MG tablet   glimepiride (AMARYL) 2 MG tablet   Insulin Glargine (BASAGLAR KWIKPEN) 100 UNIT/ML   Other Relevant Orders   POCT glucose (manual entry) (Completed)   Urine microalbumin-creatinine with uACR     Genitourinary   Acute-on-chronic kidney injury (HCC)    Renal function stable we will monitor        Other   Hyperlipidemia    Continue Crestor      Relevant Medications   amLODipine (NORVASC) 10 MG tablet   aspirin EC 81 MG tablet   irbesartan (AVAPRO) 75 MG tablet   metoprolol succinate (TOPROL-XL) 25 MG 24 hr tablet   rosuvastatin (CRESTOR) 20 MG tablet   Other Visit Diagnoses     Diarrhea, unspecified type         Check urine for microalbumin Return in about 6 weeks (around 05/30/2022) for htn, diabetes.   Asencion Noble, MD

## 2022-04-19 ENCOUNTER — Other Ambulatory Visit: Payer: Self-pay

## 2022-04-19 ENCOUNTER — Telehealth: Payer: Self-pay

## 2022-04-19 NOTE — Assessment & Plan Note (Signed)
Renal function stable we will monitor 

## 2022-04-19 NOTE — Assessment & Plan Note (Signed)
Poorly controlled diabetes referral back to clinical pharmacy made continue insulin and Amaryl

## 2022-04-19 NOTE — Telephone Encounter (Signed)
Diclofenac gel PA has been approved until 04/18/2025.  Pharmacy has been notified.

## 2022-04-19 NOTE — Assessment & Plan Note (Signed)
Blood pressure at goal no change in medications refills issued

## 2022-04-19 NOTE — Assessment & Plan Note (Signed)
Continue Crestor 

## 2022-04-19 NOTE — Assessment & Plan Note (Signed)
I have resumed the patient's clopidogrel she is to take this daily along with aspirin until we can get her into neurology for further assessments  Patient to maintain low-dose Crestor  Referral back to neurology was made

## 2022-04-29 ENCOUNTER — Encounter
Payer: BC Managed Care – PPO | Attending: Physical Medicine & Rehabilitation | Admitting: Physical Medicine & Rehabilitation

## 2022-04-29 DIAGNOSIS — I639 Cerebral infarction, unspecified: Secondary | ICD-10-CM | POA: Insufficient documentation

## 2022-04-29 DIAGNOSIS — F1910 Other psychoactive substance abuse, uncomplicated: Secondary | ICD-10-CM | POA: Insufficient documentation

## 2022-04-29 DIAGNOSIS — M25561 Pain in right knee: Secondary | ICD-10-CM | POA: Insufficient documentation

## 2022-04-29 DIAGNOSIS — G8929 Other chronic pain: Secondary | ICD-10-CM | POA: Insufficient documentation

## 2022-04-29 DIAGNOSIS — E1169 Type 2 diabetes mellitus with other specified complication: Secondary | ICD-10-CM | POA: Insufficient documentation

## 2022-04-29 DIAGNOSIS — I10 Essential (primary) hypertension: Secondary | ICD-10-CM | POA: Insufficient documentation

## 2022-05-01 ENCOUNTER — Telehealth: Payer: Self-pay | Admitting: Critical Care Medicine

## 2022-05-01 NOTE — Telephone Encounter (Signed)
Pt called in for assistance. Pt says that she spoke with provider about getting a nurse aide. Pt says that she spoke with her insurance and was told that PCP just need to complete a Prior Authorization with them.     Pt would like further assistance.    CB: [phone]

## 2022-05-01 NOTE — Telephone Encounter (Signed)
[name] pt had significant stroke and a PCS is warranted  I am ok with this

## 2022-05-02 ENCOUNTER — Telehealth: Payer: Self-pay | Admitting: Emergency Medicine

## 2022-05-02 NOTE — Telephone Encounter (Signed)
I spoke to the patient and explained the difference between home health aide and personal care services.  She is really looking for PCS.  She said she called her insurance company and they told her that she has a benefit for an aide and all that is needed is a prior [place].  I explained that I will need to call her insurance company and clarify the benefit and what is needed from the provider. She said she understood.

## 2022-05-02 NOTE — Telephone Encounter (Signed)
Copied from CRM [id number]. Topic: General - Call Back - No Documentation >> May 02, 2022  5:12 PM Ja-Kwan M wrote: Reason for CRM: Pt stated she had a missed call from the office so she was returning the call. Cb# [phone]

## 2022-05-03 NOTE — Telephone Encounter (Signed)
Called patient and left voicemail   Last person patient spoke to is [place]

## 2022-05-07 NOTE — Telephone Encounter (Signed)
I called [facility] customer service [phone] and spoke to [place].  She explained that the patient does have a home health benefit.  However she could not confirm if this includes just personal care services. She has met her deductible for the year:  $1250 and has also met her out of pocket this year $51. She explained that there is no guarantee of payment for services.  There are no limit to PT and OT visits when medically necessary. She stated that [facility] needs to be contacted for prior auth for home health services. [facility] [phone].  Call reference # [id number].  I called [facility], spoke to [facility] who said that she does not have any information about coverage for personal care services. She stated that a request for prior auth for services with the appropriate codes can be submitted by the provider for review. She did not have codes for the services and also stated that the home health agency usually obtains the prior authorization for home health services

## 2022-05-14 ENCOUNTER — Telehealth: Payer: Self-pay | Admitting: Critical Care Medicine

## 2022-05-14 NOTE — Telephone Encounter (Signed)
[facility] called, long hold time. Patient called, recording call could not be completed as dialed.

## 2022-05-14 NOTE — Telephone Encounter (Signed)
[facility] called and long hold time. Will attempt to call again. Rx sent on 04/18/22 #60/1 refill, so patient should have medication available.

## 2022-05-14 NOTE — Telephone Encounter (Signed)
Medication Refill - Medication: clopidogrel (PLAVIX) 75 MG tablet [[id number]]   Has the patient contacted their pharmacy? Yes.   (Agent: If no, request that the patient contact the pharmacy for the refill. If patient does not wish to contact the pharmacy document the reason why and proceed with request.) (Agent: If yes, when and what did the pharmacy advise?)  Preferred Pharmacy (with phone number or street name):  [id number] - [place], [place] - [address] AT [facility] [address]  [address] [place] [facility] [address]  Phone: [phone] Fax: [phone]  Hours: Open 24 hours   Has the patient been seen for an appointment in the last year OR does the patient have an upcoming appointment? Yes.    Agent: Please be advised that RX refills may take up to 3 business days. We ask that you follow-up with your pharmacy.

## 2022-05-15 ENCOUNTER — Other Ambulatory Visit: Payer: Self-pay

## 2022-05-15 MED ORDER — CLOPIDOGREL BISULFATE 75 MG PO TABS
ORAL_TABLET | ORAL | 1 refills | Status: DC
Start: 2022-05-15 — End: 2022-11-21

## 2022-05-15 NOTE — Telephone Encounter (Signed)
Called [facility], verified that Plavix was sent to pharmacy and received on 04/18/22. Called pt to advise him, no answer and unable to leave VM.

## 2022-05-15 NOTE — Addendum Note (Signed)
Addended by: Lois Huxley, Jeannett Senior L on: 05/15/2022 12:14 PM   Modules accepted: Orders

## 2022-05-15 NOTE — Telephone Encounter (Addendum)
[facility] was needing rx to be sent as a 90-day supply. Rx sent.

## 2022-05-30 NOTE — Telephone Encounter (Signed)
Attempted to contact patient twice # [phone]  and the recording stated that the call could  not be completed at dialed.

## 2022-06-01 NOTE — Progress Notes (Deleted)
New Patient Office Visit  Subjective    Patient ID: Stacey Lucas, female    DOB: 02-20-1976  Age: 46 y.o. MRN: [id number]  CC:  No chief complaint on file.   HPI Stacey Lucas presents to establish care 6/27 This is a 46 year old female former cocaine user with severe hypertension was admitted for acute stroke in April 2023 she was eventually discharged from acute inpatient rehab May 11.  Below are both discharge summaries.  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  She now is living at home and has an aide which is a friend of hers who is a Chief Executive Officer.  Patient is requesting personal care services and would like a Dexcom meter.  History of type 2 diabetes she was sent out without any insulin but given Amaryl only.  The concern was she might overdose and be hypoglycemic with her recent stroke.  Patient currently is taking the Amaryl only.  On arrival blood sugar was 230.  Blood pressure 113/74 The patient struggles with the diet.  She eats [place] sausage and grits and eggs in the morning to skips lunch and/or-is fast food in the evening.  Patient had a urinary tract infection during the last hospitalization but has not no symptoms now.  She has no other real complaints at this time except for numbness in the right foot and pain in the right knee.  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  She is requesting personal care services to help with bathing and ADLs.  She has difficulty accessing healthy foods.  There was a delay in getting her appointment in our clinic and she did miss 1 appointment due to communication breakdown.  Luckily rehab did agree to bridge her medications at this visit. 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 is a 46 y.o. 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 She had near resolution of original symptoms (left side weakness) and on the way to MRI developed right side weakness, dysarthria, and a facial droop. MRI was aborted and TNKase was given in ED. MRI today shows an acute infarct of the left corona radiata/basal ganglia.  DAPT therapy started 24 hours post TNK.  DVT prophylaxis with Lovenox. TCD and TEE negative.     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 Small vessel disease. Atrophy. ASPECTS 10.    CTA head & neck Atherosclerotic irregularity of the M2 and more distal MCA vessels, bilaterally. Most notably, there is an apparent moderate/severe focal stenosis within a superior division mid M2 left MCA vessel. MRI  2.4 x  1.1 x 2.0 cm acute infarct within the left corona radiata/basal ganglia. 2D Echo EF 70-75% with grade I diastolic dysfunction TCD and TEE negative LDL 117 HgbA1c 7.1 VTE prophylaxis - lovenox  No antithrombotic prior to admission, now on aspirin 81 mg daily and clopidogrel 75 mg daily.  For 3 weeks and then aspirin alone. Therapy recommendations:  CIR Disposition:  pending   Hypertension Home meds:  valsartan, toprol  XL Stable Permissive hypertension (OK if < 220/120) but gradually normalize in 5-7 days Long-term BP goal normotensive   Hyperlipidemia Home meds:  rosuvastatin 60m LDL 117, goal < 70 Increase to rosuvastatin 231mContinue statin at discharge   Diabetes type II Controlled Home meds:  Insulin HgbA1c 7.1, goal < 7.0 CBGs SSI- increased with meal coverage   Other Stroke Risk Factors Hx stroke/TIA 05/2021-right caudate infarct secondary to small vessel disease Cigarette smoker, advised to stop smoking Cocaine use Migraines Currently not on any medications   Other Active Problems UTI on 3d Rocephin, Ucx greater than 100,000 colonies of group B strep sensitivity pending GERD Anxiety/depression Incidental thyroid nodule: follow up with PCP Noted during previous hospital stay  Rehab: Admit date: 01/02/2022 Discharge date: 01/17/2022   Discharge Diagnoses:  Principal Problem:   CVA (cerebral vascular accident) ([facility] Problems:   Type 2 diabetes mellitus without complication ([facility]  Essential hypertension   Hyperlipidemia   Thyroid nodule   Anemia   Acute-on-chronic kidney injury ([facility] Brief HPI:   Stacey Lucas a [age].o. female with history of uncontrolled T2DM with neuropathy/RLE weakness, CVA 09/22, thyroid nodule, depression, ongoing cocaine abuse who was admitted on 12/29/2021 with left-sided weakness, left facial weakness and slurred speech which was transient.  She then developed near flaccidity of R UE, RLE greater than LLE drift, slurred speech and right facial droop while in ED.  UDS was positive for cocaine.  CTA head neck was negative for LVO.  Blood pressure noted to be elevated and was treated with IV labetalol and an Cleviprex.  She received TNK and follow-up MRI brain showed acute infarct in left corona radiata, basal ganglia and chronic small vessel disease.  2D echo showed EF of 70 to 75% with mild LVH.     Follow-up TEE was negative for intra-atrial  shunt, thrombus and negative bubble study.  EEG was negative for seizures.  Hospital course was significant for acute on chronic renal failure with rise in serum creatinine to 1.66.  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 SeLeonie Manecommended DAPT x3 weeks followed by aspirin alone.  She continues to be limited by right-sided weakness with left inattention, right lean as well as right knee instability with standing attempts.  CIR was recommended due to functional decline.     Hospital Course: TeBrynlie Dazaas admitted to rehab 01/02/2022 for inpatient therapies to consist of PT, ST and OT at least three hours five days a week. Past admission physiatrist, therapy team and rehab RN have worked together to provide customized collaborative inpatient rehab.  Her blood pressures were monitored on TID basis and have been well controlled.  Serial check of electrolytes showed acute on chronic chronic renal failure therefore Avapro was just decreased to 75 amlodipine was added and titrated to 10 mg/day.  She has had recurrent rise in his serum creatinine was advised to increase p.o. fluids recommendations to have repeat labs monitoring of renal status.  Has been stable and she has shown good  participation with therapy.  She was started on bupropion to help with anxiety as well as quit smoking.  Gabapentin for $00 mg twice daily was added to assist with cocaine cessation however patient was unable to tolerate this due to extreme fatigue/balance issues therefore this was discontinued   Follow-up CBC showed H&H to be stable without signs of bleeding.  She was maintained on DAPT during her stay after 3 days.  Her diabetes has been monitored with ac/hs CBG checks and SSI was use prn for tighter BS control.  Low-dose Amaryl was added due to CKD in addition to insulin glargine 5 units at bedtime.  Her p.o. intake has been good and blood sugars are relatively controlled but will need further adjustment on  outpatient basis. Patient and fiancee have been educated on effects of tobacco, marijuana/cocaine on brain and it's stroke risk as well as importance of medical/medication compliance. She has been set up with Dr. Wynetta Emery for primary care follow up.  She has made good gains during her rehab stay and requires intermittent supervision for mobility.  She will continue to receive follow-up outpatient PT and OT at [facility] neuro rehab after discharge.     Rehab course: During patient's stay in rehab weekly team conferences were held to monitor patient's progress, set goals and discuss barriers to discharge. At admission, patient required min to mod assist with ADL tasks and mod assist with mobility. She exhibited mild cognitive impairments with acute mild dysarthria and deficits.  She  has had improvement in activity tolerance, balance, postural control as well as ability to compensate for deficits.  She has had improvement in functional use RUE  and RLE as well as improvement in awareness. She is able to complete ADL tasks with CGA to min assist. She is modified independent for transfers and to ambulate 150' with RW.  She requires CGA to climb 12 stairs. Speech is intelligible and she is able to complete cognitive tasks with repetition as well as extra time for processing. Family education was completed.    Discharge disposition: 01-Home or Self Care  Below is the last rehab visit outpatient which occurred May 25 1. Functional deficits secondary to left corona radiata infarct. Cocaine related vasculopathy             -Continue outpatient PT/OT            -Order placed for shower bench             -Home health aid requested, order placed but she does not qualify at this time due to lack of skilled home need     2. HTN well-controlled today -continue Avapro and metoprolol -Metoprolol was refilled, follow-up with primary care physician     3. T2DM -Continue current medications, glucometer was ordered      4.  Polysubstance use with history of tobacco abuse , cocaine use -Continue buproprion, congratulated her on not smoking tobacco or using other illicit substances   5.  Right knee pain -Suspect patellofemoral syndrome, Pt to work on this with PT,  Can use knee brace and continue Voltaren gel   Below is a phone note from rehab 6/22 phone note: Ms Mckesson's fiance showed up in the office stating she does not have appt with PCP until 03/05/22 and they did not get her amlodipine refill and the plavix. I have reviewed chart and the refill on the plavix was sent in but only 4 tablets (she had 2 separate rx from [facility]  [place]). I have refilled the amlodipine 30 d x1 and the plavix 30 d x1 with no further refills from our office. She no showed  her scheduled appt with PCP 02/19/22 and it has been rescheduled to 03/05/22 with Asencion Noble MD. She has not seen Dr Leonie Man and no appt scheduled. I reviewed the referral and it looks like the [place] office has been unable to reach the patient to set the appt. I reprinted the AVS from the inpatient discharge and highlighted [facility] address and phone # for them to call when he gets home!!! (she was on the phone speaker while he was here). I also highlighted the PCP appt and reinforced they cannot miss that appointment for we can not refill her meds again. They both verbalized understanding.   There is a history of smoking patient is smoking less at this time she was on bupropion for this also given Celexa for depression she states her depression is at baselineAnd is stable  8/10 This patient is seen in return follow-up from stroke symptoms she is yet to see neurology.  She has run out of her clopidogrel and is not taking it currently.  The plan was to stop after 3 months however when she did stop the medicine she noted increased weakness in the right arm and leg.  She is still using the walker.  She is completed her rehab.  She was followed by physical  medicine. Patient states she gets cold in the right arm and right lower extremity at times.  On arrival blood pressure is well-controlled 118/70.  She maintains Iber Sartain, amlodipine, and metoprolol.  Patient also maintains low-dose aspirin and Crestor.  Patient does maintain low-dose insulin glargine 5 units daily however she has not picked up a recent refill of this.  For her diabetes she maintains Amaryl alone.  The patient does not monitor her blood sugars on a regular basis.  On arrival blood sugar is 259 The patient receive the Cologuard kit but could not process that she is having too many loose stools  9/25 Home health aide  Blood pressure at goal no change in medications refills issued         Relevant Medications    amLODipine (NORVASC) 10 MG tablet    aspirin EC 81 MG tablet    irbesartan (AVAPRO) 75 MG tablet    metoprolol succinate (TOPROL-XL) 25 MG 24 hr tablet    rosuvastatin (CRESTOR) 20 MG tablet    Cerebral thrombosis with cerebral infarction    Relevant Medications    amLODipine (NORVASC) 10 MG tablet    aspirin EC 81 MG tablet    irbesartan (AVAPRO) 75 MG tablet    metoprolol succinate (TOPROL-XL) 25 MG 24 hr tablet    rosuvastatin (CRESTOR) 20 MG tablet    Other Relevant Orders    Ambulatory referral to Neurology    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      I have resumed the patient's clopidogrel she is to take this daily along with aspirin until we can get her into neurology for further assessments   Patient to maintain low-dose Crestor   Referral back to neurology was made            Relevant Medications    amLODipine (NORVASC) 10 MG tablet    aspirin EC 81 MG tablet    irbesartan (AVAPRO) 75 MG tablet    metoprolol succinate (TOPROL-XL) 25 MG  24 hr tablet    rosuvastatin (CRESTOR) 20 MG tablet        Endocrine    Type 2 diabetes mellitus without complication (HCC) - Primary       Poorly controlled diabetes referral back to clinical pharmacy made continue insulin and Amaryl        Relevant Medications    aspirin EC 81 MG tablet    irbesartan (AVAPRO) 75 MG tablet    rosuvastatin (CRESTOR) 20 MG tablet    glimepiride (AMARYL) 2 MG tablet    Insulin Glargine (BASAGLAR KWIKPEN) 100 UNIT/ML    Other Relevant Orders    POCT glucose (manual entry) (Completed)    Urine microalbumin-creatinine with uACR        Genitourinary    Acute-on-chronic kidney injury ([place])      Renal function stable we will monitor            Other    Hyperlipidemia      Continue Crestor       Outpatient Encounter Medications as of 06/03/2022  Medication Sig   ACCU-CHEK GUIDE test strip USE UP TO FOUR TIMES DAILY AS DIRECTED   Accu-Chek Softclix Lancets lancets SMARTSIG:Topical 1-4 Times Daily   amLODipine (NORVASC) 10 MG tablet Take 1 tablet (10 mg total) by mouth daily.   aspirin EC 81 MG tablet Take 1 tablet (81 mg total) by mouth daily. Swallow whole.   blood glucose meter kit and supplies KIT Dispense based on patient and insurance preference. Use up to four times daily as directed.   buPROPion (WELLBUTRIN XL) 150 MG 24 hr tablet TAKE 1 TABLET(150 MG) BY MOUTH DAILY   citalopram (CELEXA) 10 MG tablet Take 1 tablet (10 mg total) by mouth daily.   clopidogrel (PLAVIX) 75 MG tablet TAKE 1 TABLET(75 MG) BY MOUTH DAILY   diclofenac Sodium (VOLTAREN) 1 % GEL Apply 4 g topically daily as needed (For knee pain).   dicyclomine (BENTYL) 10 MG capsule Take 1 capsule (10 mg total) by mouth 4 (four) times daily -  before meals and at bedtime. (Patient not taking: Reported on 04/18/2022)   glimepiride (AMARYL) 2 MG tablet Take 1 tablet (2 mg total) by mouth daily with breakfast.   Insulin Glargine (BASAGLAR KWIKPEN) 100 UNIT/ML Inject 5 Units into the skin daily.   Insulin Pen Needle 32G X 4 MM MISC Use to inject insulin at bed time   irbesartan (AVAPRO) 75 MG tablet Take 1 tablet (75 mg  total) by mouth daily.   meloxicam (MOBIC) 15 MG tablet Take 1 tablet (15 mg total) by mouth daily.   metoprolol succinate (TOPROL-XL) 25 MG 24 hr tablet Take 3 tablets (75 mg total) by mouth daily.   rosuvastatin (CRESTOR) 20 MG tablet Take 1 tablet (20 mg total) by mouth at bedtime.   No facility-administered encounter medications on file as of 06/03/2022.    Past Medical History:  Diagnosis Date   Anxiety and depression    Cocaine abuse ([place])    Depression with suicidal ideation    [facility] admission 04/2018   Diabetes mellitus without complication (HCC)    Type II   GERD (gastroesophageal reflux disease)    Hyperlipidemia    Hypertension    Impingement syndrome of right shoulder    Migraine headache    Osteoarthritis of right knee 05/24/2021   Pilonidal abscess 05/2013   Pyelonephritis    Right thyroid nodule    Sciatica     Past Surgical History:  Procedure Laterality Date   BUBBLE STUDY  01/01/2022   Procedure: BUBBLE STUDY;  Surgeon: Janina Mayo, MD;  Location: [facility];  Service: Cardiovascular;;   TEE WITHOUT CARDIOVERSION N/A 01/01/2022   Procedure: TRANSESOPHAGEAL ECHOCARDIOGRAM (TEE);  Surgeon: Janina Mayo, MD;  Location: [facility] ENDOSCOPY;  Service: Cardiovascular;  Laterality: N/A;   tubal      Family History  Problem Relation Age of Onset   Diabetes Mother    Hypertension Mother    Arthritis Mother    Diabetes Father    Hypertension Father     Social History   Socioeconomic History   Marital status: Single    Spouse name: Not on file   Number of children: 3   Years of education: Not on file   Highest education level: Not on file  Occupational History   Not on file  Tobacco Use   Smoking status: Former    Packs/day: 1.00    Types: Cigarettes    Quit date: 01/16/2022    Years since quitting: 0.3   Smokeless tobacco: Never  Vaping Use   Vaping Use: Never used  Substance and Sexual Activity   Alcohol use: Yes    Comment: occasionally   Drug  use: No   Sexual activity: Yes    Birth control/protection: None  Other Topics Concern   Not on file  Social History Narrative   Lives with children   "lots of caffeine"   Social Determinants of Health   Financial Resource Strain: Not on file  Food Insecurity: Not on file  Transportation Needs: Not on file  Physical Activity: Not on file  Stress: Not on file  Social Connections: Not on file  Intimate Partner Violence: Not on file    Review of Systems  Constitutional:  Negative for chills, diaphoresis, fever, malaise/fatigue and weight loss.  HENT:  Negative for congestion, hearing loss, nosebleeds, sore throat and tinnitus.   Eyes:  Negative for blurred vision, photophobia and redness.  Respiratory:  Negative for cough, hemoptysis, sputum production, shortness of breath, wheezing and stridor.   Cardiovascular:  Negative for chest pain, palpitations, orthopnea, claudication, leg swelling and PND.  Gastrointestinal:  Negative for abdominal pain, blood in stool, constipation, diarrhea, heartburn, nausea and vomiting.  Genitourinary:  Negative for dysuria, flank pain, frequency, hematuria and urgency.  Musculoskeletal:  Positive for joint pain. Negative for back pain, falls, myalgias and neck pain.       Gait difficulty  Skin:  Negative for itching and rash.  Neurological:  Positive for tingling, sensory change, focal weakness and weakness. Negative for dizziness, tremors, speech change, seizures, loss of consciousness and headaches.  Endo/Heme/Allergies:  Negative for environmental allergies and polydipsia. Does not bruise/bleed easily.  Psychiatric/Behavioral: Negative.  Negative for depression, memory loss, substance abuse and suicidal ideas. The patient is not nervous/anxious and does not have insomnia.         Objective    There were no vitals taken for this visit.  Physical Exam Vitals reviewed.  Constitutional:      Appearance: Normal appearance. She is well-developed  and normal weight. She is not diaphoretic.  HENT:     Head: Normocephalic and atraumatic.     Nose: No nasal deformity, septal deviation, mucosal edema or rhinorrhea.     Right Sinus: No maxillary sinus tenderness or frontal sinus tenderness.     Left Sinus: No maxillary sinus tenderness or frontal sinus tenderness.     Mouth/Throat:     Pharynx:  No oropharyngeal exudate.  Eyes:     General: No scleral icterus.    Conjunctiva/sclera: Conjunctivae normal.     Pupils: Pupils are equal, round, and reactive to light.  Neck:     Thyroid: No thyromegaly.     Vascular: No carotid bruit or JVD.     Trachea: Trachea normal. No tracheal tenderness or tracheal deviation.  Cardiovascular:     Rate and Rhythm: Normal rate and regular rhythm.     Chest Wall: PMI is not displaced.     Pulses: Normal pulses. No decreased pulses.     Heart sounds: Normal heart sounds, S1 normal and S2 normal. Heart sounds not distant. No murmur heard.    No systolic murmur is present.     No diastolic murmur is present.     No friction rub. No gallop. No S3 or S4 sounds.  Pulmonary:     Effort: No tachypnea, accessory muscle usage or respiratory distress.     Breath sounds: No stridor. No decreased breath sounds, wheezing, rhonchi or rales.  Chest:     Chest wall: No tenderness.  Abdominal:     General: Bowel sounds are normal. There is no distension.     Palpations: Abdomen is soft. Abdomen is not rigid.     Tenderness: There is no abdominal tenderness. There is no guarding or rebound.  Musculoskeletal:        General: Normal range of motion.     Cervical back: Normal range of motion and neck supple. No edema, erythema or rigidity. No muscular tenderness. Normal range of motion.     Comments: Foot exam normal  Lymphadenopathy:     Head:     Right side of head: No submental or submandibular adenopathy.     Left side of head: No submental or submandibular adenopathy.     Cervical: No cervical adenopathy.   Skin:    General: Skin is warm and dry.     Coloration: Skin is not pale.     Findings: No rash.     Nails: There is no clubbing.  Neurological:     Mental Status: She is alert and oriented to person, place, and time. Mental status is at baseline.     Cranial Nerves: No cranial nerve deficit.     Sensory: Sensory deficit present.     Motor: Weakness present.     Coordination: Coordination abnormal.     Gait: Gait abnormal.     Deep Tendon Reflexes: Reflexes normal.  Psychiatric:        Mood and Affect: Mood normal.        Speech: Speech normal.        Behavior: Behavior normal.        Thought Content: Thought content normal.        Judgment: Judgment normal.        SIGNIFICANT DIAGNOSTIC STUDIES  Imaging Results  CT HEAD WO CONTRAST (5MM)   Result Date: 12/31/2021 CLINICAL DATA:  Stroke, follow-up EXAM: CT HEAD WITHOUT CONTRAST TECHNIQUE: Contiguous axial images were obtained from the base of the skull through the vertex without intravenous contrast. RADIATION DOSE REDUCTION: This exam was performed according to the departmental dose-optimization program which includes automated exposure control, adjustment of the mA and/or kV according to patient size and/or use of iterative reconstruction technique. COMPARISON:  12/29/2021 FINDINGS: Brain: Evolving recent infarction involving the left corona radiata and basal ganglia. Chronic bilateral basal ganglia infarcts. Additional patchy hypoattenuation in the supratentorial white  matter likely reflects nonspecific gliosis/demyelination. No acute intracranial hemorrhage or mass effect. Ventricles are normal in size. No extra-axial collection. Vascular: There is mild atherosclerotic calcification at the skull base. Skull: Calvarium is unremarkable. Sinuses/Orbits: No acute finding. Other: None. IMPRESSION: Evolving recent infarction of left corona radiata and basal ganglia. No acute intracranial hemorrhage. Chronic infarcts and chronic  microvascular ischemic changes. Electronically Signed   By: Macy Mis M.D.   On: 12/31/2021 10:52    CT HEAD WO CONTRAST (5MM)   Result Date: 12/08/2021 CLINICAL DATA:  Headache, chronic, no new features EXAM: CT HEAD WITHOUT CONTRAST TECHNIQUE: Contiguous axial images were obtained from the base of the skull through the vertex without intravenous contrast. RADIATION DOSE REDUCTION: This exam was performed according to the departmental dose-optimization program which includes automated exposure control, adjustment of the mA and/or kV according to patient size and/or use of iterative reconstruction technique. COMPARISON:  05/23/2021 FINDINGS: Brain: No evidence of acute infarction, hemorrhage, hydrocephalus, extra-axial collection or mass lesion/mass effect. Chronic bilateral basal ganglia lacunar infarcts. Mild scattered low-density changes within the periventricular and subcortical white matter compatible with chronic microvascular ischemic change. Vascular: No hyperdense vessel or unexpected calcification. Skull: Normal. Negative for fracture or focal lesion. Sinuses/Orbits: No acute finding. Other: None. IMPRESSION: 1. No acute intracranial abnormality. 2. Mild chronic microvascular ischemic change. Electronically Signed   By: Davina Poke D.O.   On: 12/08/2021 13:46    MR BRAIN WO CONTRAST   Result Date: 12/30/2021 CLINICAL DATA:  Provided history: Stroke, follow-up. EXAM: MRI HEAD WITHOUT CONTRAST TECHNIQUE: Multiplanar, multiecho pulse sequences of the brain and surrounding structures were obtained without intravenous contrast. COMPARISON:  CT angiogram head/neck 12/29/2021. Noncontrast head CT 12/29/2021. Brain MRI 05/23/2021. FINDINGS: Brain: The patient was unable to tolerate the full examination. As a result, axial T1 weighted and coronal T2 TSE sequences could not be obtained. The acquired sequences are intermittently motion degraded. Most notably, there is severe motion degradation of the  axial SWI sequence. No age advanced or lobar predominant parenchymal atrophy. 2.4 x 1.1 x 2.0 cm acute infarct within the left corona radiata/basal ganglia. Some residual diffusion weighted hyperintensity at site of a chronic infarct within the right corona radiata. Chronic lacunar infarcts within the bilateral corona radiata/deep gray nuclei. Moderate (and significantly advanced for age) multifocal T2 FLAIR hyperintense signal abnormality elsewhere within the cerebral white matter, nonspecific but likely reflecting chronic small vessel ischemic disease given the patient's history of hypertension, diabetes and hyperlipidemia. Mild chronic small-vessel ischemic changes are also present within the pons No evidence of an intracranial mass. No extra-axial fluid collection. No midline shift. Vascular: Maintained flow voids within the proximal large arterial vessels. Skull and upper cervical spine: No focal suspicious marrow lesion. Sinuses/Orbits: Visualized orbits show no acute finding. Minimal scattered paranasal sinus mucosal thickening. IMPRESSION: Prematurely terminated and motion degraded examination. 2.4 x 1.1 x 2.0 cm acute infarct within the left corona radiata/basal ganglia. Superimposed chronic small-vessel infarcts within the bilateral corona radiata/basal ganglia. Background moderate cerebral white matter chronic small vessel ischemic disease. Mild chronic small vessel ischemic changes are also present within the pons. Electronically Signed   By: Kellie Simmering D.O.   On: 12/30/2021 13:20    DG CHEST PORT 1 VIEW   Result Date: 12/29/2021 CLINICAL DATA:  CVA EXAM: PORTABLE CHEST 1 VIEW COMPARISON:  06/18/2018 chest radiograph FINDINGS: The cardiomediastinal silhouette is unremarkable. There is no evidence of focal airspace disease, pulmonary edema, suspicious pulmonary nodule/mass, pleural effusion, or pneumothorax.  No acute bony abnormalities are identified. IMPRESSION: No active disease. Electronically  Signed   By: Margarette Canada M.D.   On: 12/29/2021 16:36    EEG adult   Result Date: 12/31/2021 Lora Havens, MD     12/31/2021  2:52 PM Patient Name: Yaneth Fairbairn MRN: [id number] Epilepsy Attending: Lora Havens Referring Physician/Provider: Garvin Fila, MD Date: 12/31/2021 Duration: 21.53 mins Patient history: 46 year old female with acute left corona radiata/basal ganglia stroke.  EEG evaluate for seizure. Level of alertness: Awake AEDs during EEG study: None Technical aspects: This EEG study was done with scalp electrodes positioned according to the 10-20 International system of electrode placement. Electrical activity was acquired at a sampling rate of _0  and reviewed with a high frequency filter of _1  and a low frequency filter of _2 . EEG data were recorded continuously and digitally stored. Description: The posterior dominant rhythm consists of 9 Hz activity of moderate voltage (25-35 uV) seen predominantly in posterior head regions, symmetric and reactive to eye opening and eye closing. Hyperventilation and photic stimulation were not performed.   IMPRESSION: This study is within normal limits. No seizures or epileptiform discharges were seen throughout the recording. Priyanka O Yadav    VAS [place] TRANSCRANIAL DOPPLER W BUBBLES   Result Date: 01/01/2022  Transcranial Doppler with Bubble Patient Name:  BRYANNE RIQUELME  Date of Exam:   12/31/2021 Medical Rec #: [id number]     Accession #:    [id number] Date of Birth: [birth year]    Patient Gender: F Patient Age:   36 years Exam Location:  [facility] Procedure:      VAS [place] TRANSCRANIAL DOPPLER W BUBBLES Referring Phys: Janine Ores --------------------------------------------------------------------------------  Indications: Stroke. Comparison Study: No priors. Performing Technologist: Darlin Coco RDMS, RVT  Examination Guidelines: A complete evaluation includes B-mode imaging, spectral Doppler, color Doppler, and power Doppler as needed of  all accessible portions of each vessel. Bilateral testing is considered an integral part of a complete examination. Limited examinations for reoccurring indications may be performed as noted.  Summary: No HITS at rest or during Valsalva. Negative transcranial Doppler Bubble study with no evidence of right to left intracardiac communication.  A vascular evaluation was performed. The right middle cerebral artery was studied. An IV was inserted into the patient's left forearm. Verbal informed consent was obtained.  NEGATIVE TCD Bubble study with no evidence of right to left shunt noted. *See table(s) above for TCD measurements and observations.  Diagnosing physician: Antony Contras MD Electronically signed by Antony Contras MD on 01/01/2022 at 10:54:57 AM.    Final     ECHOCARDIOGRAM COMPLETE   Result Date: 12/30/2021    ECHOCARDIOGRAM REPORT   Patient Name:   KYANA AICHER Date of Exam: 12/30/2021 Medical Rec #:  [id number]    Height:       65.0 in Accession #:    [id number]   Weight:       153.0 lb Date of Birth:  01-10-1976   BSA:          1.765 m Patient Age:    32 years     BP:           154/100 mmHg Patient Gender: F            HR:           78 bpm. Exam Location:  Inpatient Procedure: 2D Echo Indications:    stroke  History:        Patient has prior  history of Echocardiogram examinations, most                 recent 05/23/2021. Arrythmias:Atrial Fibrillation; Risk                 Factors:Diabetes.  Sonographer:    Johny Chess RDCS Referring Phys: [id number] ASHISH ARORA IMPRESSIONS  1. Left ventricular ejection fraction, by estimation, is 70 to 75%. Left ventricular ejection fraction by PLAX is 71 %. The left ventricle has hyperdynamic function. The left ventricle has no regional wall motion abnormalities. There is mild left ventricular hypertrophy. Left ventricular diastolic parameters are consistent with Grade I diastolic dysfunction (impaired relaxation).  2. Right ventricular systolic function is normal. The  right ventricular size is normal. Tricuspid regurgitation signal is inadequate for assessing PA pressure.  3. The mitral valve is grossly normal. No evidence of mitral valve regurgitation.  4. The aortic valve is tricuspid. Aortic valve regurgitation is not visualized.  5. The inferior vena cava is normal in size with <50% respiratory variability, suggesting right atrial pressure of 8 mmHg. Comparison(s): Changes from prior study are noted. 05/23/2021: LVEF 60-65%, mild LVH, normal diastolic function. FINDINGS  Left Ventricle: Left ventricular ejection fraction, by estimation, is 70 to 75%. Left ventricular ejection fraction by PLAX is 71 %. The left ventricle has hyperdynamic function. The left ventricle has no regional wall motion abnormalities. The left ventricular internal cavity size was normal in size. There is mild left ventricular hypertrophy. Left ventricular diastolic parameters are consistent with Grade I diastolic dysfunction (impaired relaxation). Indeterminate filling pressures. Right Ventricle: The right ventricular size is normal. No increase in right ventricular wall thickness. Right ventricular systolic function is normal. Tricuspid regurgitation signal is inadequate for assessing PA pressure. Left Atrium: Left atrial size was normal in size. Right Atrium: Right atrial size was normal in size. Pericardium: There is no evidence of pericardial effusion. Mitral Valve: The mitral valve is grossly normal. No evidence of mitral valve regurgitation. Tricuspid Valve: The tricuspid valve is normal in structure. Tricuspid valve regurgitation is not demonstrated. Aortic Valve: The aortic valve is tricuspid. Aortic valve regurgitation is not visualized. Pulmonic Valve: The pulmonic valve was normal in structure. Pulmonic valve regurgitation is not visualized. Aorta: The aortic root and ascending aorta are structurally normal, with no evidence of dilitation. Venous: The inferior vena cava is normal in size with  less than 50% respiratory variability, suggesting right atrial pressure of 8 mmHg. IAS/Shunts: No atrial level shunt detected by color flow Doppler.  LEFT VENTRICLE PLAX 2D LV EF:         Left            Diastology                ventricular     LV e' medial:    6.74 cm/s                ejection        LV E/e' medial:  15.4                fraction by     LV e' lateral:   7.07 cm/s                PLAX is 71      LV E/e' lateral: 14.7                %. LVIDd:         4.50 cm LVIDs:  2.70 cm LV PW:         1.20 cm LV IVS:        0.90 cm LVOT diam:     2.00 cm LV SV:         56 LV SV Index:   32 LVOT Area:     3.14 cm  RIGHT VENTRICLE             IVC RV S prime:     10.40 cm/s  IVC diam: 1.90 cm TAPSE (M-mode): 2.0 cm LEFT ATRIUM             Index        RIGHT ATRIUM           Index LA diam:        3.50 cm 1.98 cm/m   RA Area:     10.50 cm LA Vol (A2C):   47.0 ml 26.62 ml/m  RA Volume:   20.60 ml  11.67 ml/m LA Vol (A4C):   44.5 ml 25.21 ml/m LA Biplane Vol: 46.8 ml 26.51 ml/m  AORTIC VALVE LVOT Vmax:   85.20 cm/s LVOT Vmean:  58.200 cm/s LVOT VTI:    0.179 m  AORTA Ao Root diam: 2.60 cm Ao Asc diam:  2.70 cm MITRAL VALVE MV Area (PHT): 3.99 cm     SHUNTS MV Decel Time: 190 msec     Systemic VTI:  0.18 m MV E velocity: 104.00 cm/s  Systemic Diam: 2.00 cm MV A velocity: 116.00 cm/s MV E/A ratio:  0.90 Lyman Bishop MD Electronically signed by Lyman Bishop MD Signature Date/Time: 12/30/2021/12:23:51 PM    Final     ECHO TEE   Result Date: 01/01/2022    TRANSESOPHOGEAL ECHO REPORT   Patient Name:   SHANTE ARCHAMBEAULT Date of Exam: 01/01/2022 Medical Rec #:  [id number]    Height:       65.0 in Accession #:    [id number]   Weight:       153.0 lb Date of Birth:  July 02, 1976   BSA:          1.765 m Patient Age:    61 years     BP:           172/80 mmHg Patient Gender: F            HR:           87 bpm. Exam Location:  Inpatient Procedure: Transesophageal Echo, Cardiac Doppler, Color Doppler and [place]             Contrast Bubble Study Indications:     Stroke  History:         Patient has prior history of Echocardiogram examinations, most                  recent 12/30/2021. Arrythmias:Atrial Fibrillation; Risk                  Factors:Diabetes, Hypertension, Dyslipidemia and Current                  Smoker.  Sonographer:     Clayton Lefort RDCS (AE) Referring Phys:  [place] Diagnosing Phys: [place] PROCEDURE: After discussion of the risks and benefits of a TEE, an informed consent was obtained from the patient. The transesophogeal probe was passed without difficulty through the esophogus of the patient. Sedation performed by different physician. The patient was monitored while under deep sedation. Anesthestetic sedation was provided intravenously  by Anesthesiology: 124.61mg  of Propofol, 60mg  of Lidocaine. Image quality was good. The patient developed no complications during the procedure. IMPRESSIONS  1. Left ventricular ejection fraction, by estimation, is 60 to 65%. The left ventricle has normal function.  2. Right ventricular systolic function is normal. The right ventricular size is normal.  3. No left atrial/left atrial appendage thrombus was detected. The LAA emptying velocity was 60 cm/s.  4. The mitral valve is normal in structure. No evidence of mitral valve regurgitation.  5. The aortic valve is normal in structure. Aortic valve regurgitation is not visualized.  6. There is mild (Grade II) plaque.  7. Agitated saline contrast bubble study was negative, with no evidence of any interatrial shunt. Conclusion(s)/Recommendation(s): No LA/LAA thrombus identified. Negative bubble study for interatrial shunt. No intracardiac source of embolism detected on this on this transesophageal echocardiogram. FINDINGS  Left Ventricle: Left ventricular ejection fraction, by estimation, is 60 to 65%. The left ventricle has normal function. The left ventricular internal cavity size was normal in size. Right Ventricle: The  right ventricular size is normal. No increase in right ventricular wall thickness. Right ventricular systolic function is normal. Left Atrium: No left atrial/left atrial appendage thrombus was detected. The LAA emptying velocity was 60 cm/s. Pericardium: There is no evidence of pericardial effusion. Mitral Valve: The mitral valve is normal in structure. No evidence of mitral valve regurgitation. Tricuspid Valve: The tricuspid valve is normal in structure. Tricuspid valve regurgitation is not demonstrated. Aortic Valve: The aortic valve is normal in structure. Aortic valve regurgitation is not visualized. Pulmonic Valve: The pulmonic valve was normal in structure. Pulmonic valve regurgitation is not visualized. Aorta: The aortic root and ascending aorta are structurally normal, with no evidence of dilitation. There is mild (Grade II) plaque. IAS/Shunts: Agitated saline contrast was given intravenously to evaluate for intracardiac shunting. Agitated saline contrast bubble study was negative, with no evidence of any interatrial shunt. Phineas Inches Electronically signed by Phineas Inches Signature Date/Time: 01/01/2022/11:57:28 AM    Final     CT HEAD CODE STROKE WO CONTRAST   Result Date: 12/29/2021 CLINICAL DATA:  Code stroke.  Left facial droop and slurred speech EXAM: CT HEAD WITHOUT CONTRAST TECHNIQUE: Contiguous axial images were obtained from the base of the skull through the vertex without intravenous contrast. RADIATION DOSE REDUCTION: This exam was performed according to the departmental dose-optimization program which includes automated exposure control, adjustment of the mA and/or kV according to patient size and/or use of iterative reconstruction technique. COMPARISON:  12/08/2021 FINDINGS: Brain: No evidence of acute infarction, hemorrhage, hydrocephalus, extra-axial collection or mass lesion/mass effect. Chronic small vessel ischemic gliosis in the cerebral white matter. Chronic perforator infarct at the  left basal ganglia. Vascular: No hyperdense vessel or unexpected calcification. Skull: Normal. Negative for fracture or focal lesion. Sinuses/Orbits: No acute finding. Other: These results were communicated to Dr. Rory Percy at 12:36 pm on 12/29/2021 by text page via the [facility] messaging system. ASPECTS [facility] Stroke Program Early CT Score) - Ganglionic level infarction (caudate, lentiform nuclei, internal capsule, insula, M1-M3 cortex): 7 - Supraganglionic infarction (M4-M6 cortex): 3 Total score (0-10 with 10 being normal): 10 IMPRESSION: 1. No acute finding. 2. Premature chronic small vessel disease. Electronically Signed   By: Jorje Guild M.D.   On: 12/29/2021 12:36    CT ANGIO HEAD NECK W WO CM (CODE STROKE)   Result Date: 12/29/2021 CLINICAL DATA:  Provided history: Neuro deficit, acute, stroke suspected. Left-sided weakness. Slurred speech. EXAM: CT ANGIOGRAPHY  HEAD AND NECK TECHNIQUE: Multidetector CT imaging of the head and neck was performed using the standard protocol during bolus administration of intravenous contrast. Multiplanar CT image reconstructions and MIPs were obtained to evaluate the vascular anatomy. Carotid stenosis measurements (when applicable) are obtained utilizing NASCET criteria, using the distal internal carotid diameter as the denominator. RADIATION DOSE REDUCTION: This exam was performed according to the departmental dose-optimization program which includes automated exposure control, adjustment of the mA and/or kV according to patient size and/or use of iterative reconstruction technique. CONTRAST:  16m OMNIPAQUE IOHEXOL 350 MG/ML SOLN COMPARISON:  Noncontrast head CT performed earlier today 12/29/2021. Brain MRI 05/23/2021. CT angiogram head/neck 05/23/2021. FINDINGS: CTA NECK FINDINGS Aortic arch: The visualized aortic arch is normal in caliber. Streak and beam hardening artifact arising from a dense left-sided contrast bolus partially obscures the innominate artery. Within  this limitation, there is no appreciable hemodynamically significant innominate or proximal subclavian artery stenosis. Right carotid system: CCA and ICA patent within the neck without stenosis or significant atherosclerotic disease. Left carotid system: CCA and ICA patent within the neck without stenosis or significant atherosclerotic disease. Vertebral arteries: Vertebral arteries codominant and patent within the neck without stenosis or significant atherosclerotic disease. Skeleton: Cervical spondylosis. No acute bony abnormality or aggressive osseous lesion. Other neck: 1.8 cm right thyroid lobe nodule. Upper chest: No consolidation within the imaged lung apices. Review of the MIP images confirms the above findings CTA HEAD FINDINGS Anterior circulation: The intracranial internal carotid arteries are patent. The M1 middle cerebral arteries are patent. No no M2 proximal branch occlusion is identified. Atherosclerotic irregularity of the M2 and more distal MCA vessels, bilaterally. Most notably, there is an apparent moderate/severe focal stenosis within a superior division mid M2 left MCA vessel (series 12, image 25). The anterior cerebral arteries are patent. No intracranial aneurysm is identified. Posterior circulation: The intracranial vertebral arteries are patent. The basilar artery is patent. The posterior cerebral arteries are patent. Posterior communicating arteries are diminutive or absent bilaterally. Venous sinuses: Within the limitations of contrast timing, no convincing thrombus. Anatomic variants: As described. Review of the MIP images confirms the above findings No emergent large vessel occlusion identified. These results were communicated to Dr. ARory Percyat 12:55 pmon 4/22/2023by text page [facility]. IMPRESSION: CTA neck: 1. The common carotid, internal carotid and vertebral arteries are patent within the neck without stenosis. 2. 1.8 cm right thyroid lobe nodule. A non-emergent  thyroid ultrasound is recommended for further evaluation. CTA head: 1. No intracranial large vessel occlusion is identified. 2. Atherosclerotic irregularity of the M2 and more distal MCA vessels, bilaterally. Most notably, there is an apparent moderate/severe focal stenosis within a superior division mid M2 left MCA vessel. Electronically Signed   By: KKellie SimmeringD.O.   On: 12/29/2021 12:59     Assessment & Plan:   Problem List Items Addressed This Visit   None Check urine for microalbumin No follow-ups on file.   PAsencion Noble MD

## 2022-06-03 ENCOUNTER — Ambulatory Visit: Payer: BC Managed Care – PPO | Admitting: Critical Care Medicine

## 2022-06-04 NOTE — Telephone Encounter (Signed)
She was a no show for her appointment yesterday at [facility]. I was planning to speak with her about home health and personal care services.   I attempted to contact patient again today [phone] and the recording stated that the call could  not be completed at dialed.

## 2022-06-06 NOTE — Telephone Encounter (Signed)
I attempted to contact patient again today  [phone] and the recording stated that the call could  not be completed as dialed.

## 2022-06-13 ENCOUNTER — Encounter: Payer: Self-pay | Admitting: Diagnostic Neuroimaging

## 2022-06-13 ENCOUNTER — Institutional Professional Consult (permissible substitution): Payer: BC Managed Care – PPO | Admitting: Diagnostic Neuroimaging

## 2022-06-15 IMAGING — CT CT HEAD CODE STROKE
3 series · 15 of 47 positions shown, 18 images · non-contrast
Comparison: Head MRI 04/14/2020

CLINICAL DATA: Code stroke. Neuro deficit, acute, stroke suspected.
Left-sided weakness.

EXAM:
CT HEAD WITHOUT CONTRAST
TECHNIQUE: Contiguous axial images were obtained from the base of the skull
through the vertex without intravenous contrast.

[Series 3: head 5.0 st · axial · 0.47mm/px · z∈[-90,+35]mm · 9 of 30 slices shown, 12 images]
[im 3/30  brain]
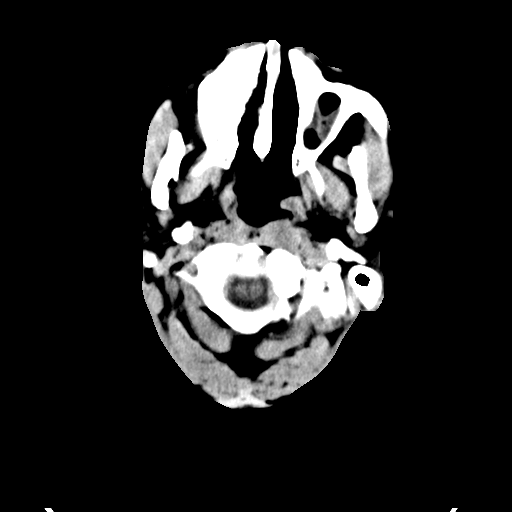
[im 3/30  bone]
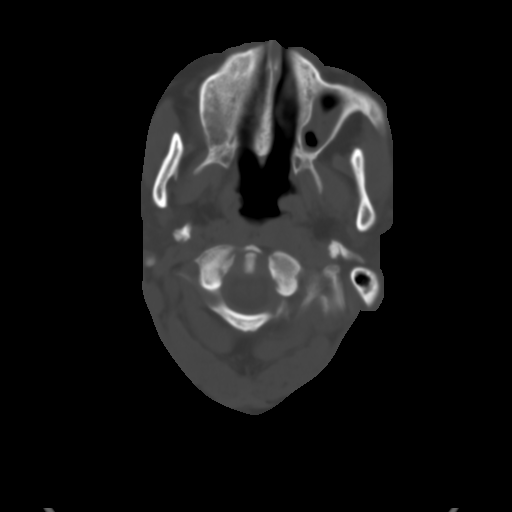
[im 6/30  brain]
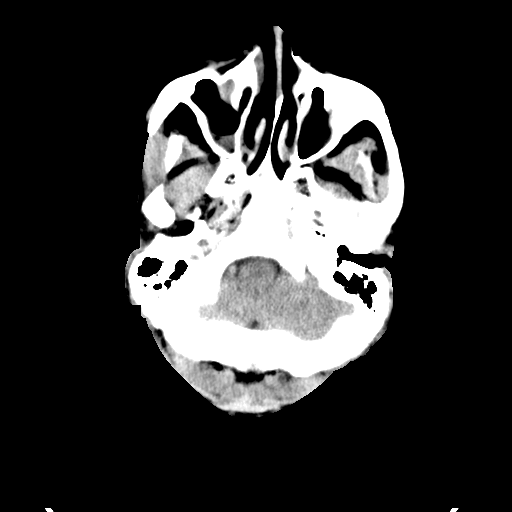
[im 9/30  brain]
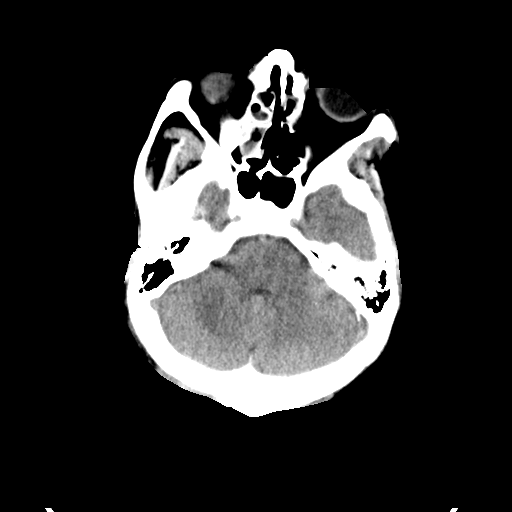
[im 12/30  brain]
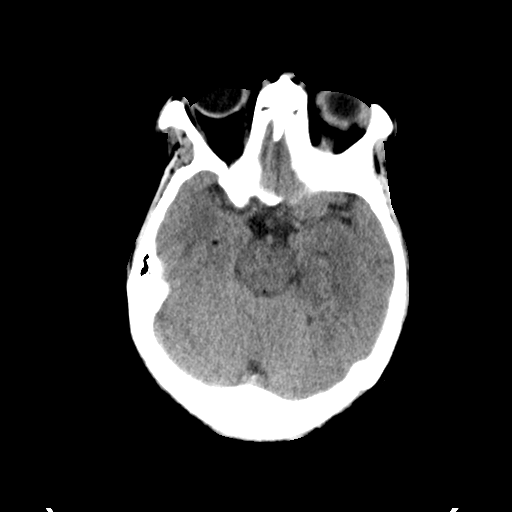
[im 16/30  brain]
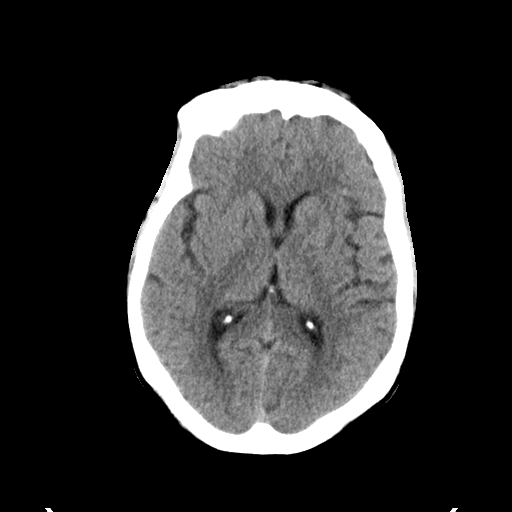
[im 16/30  bone]
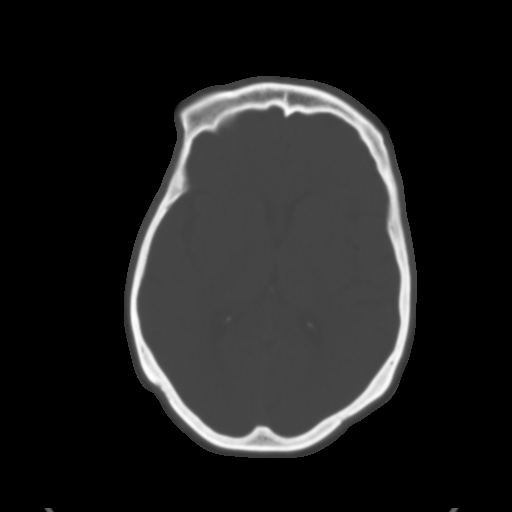
[im 19/30  brain]
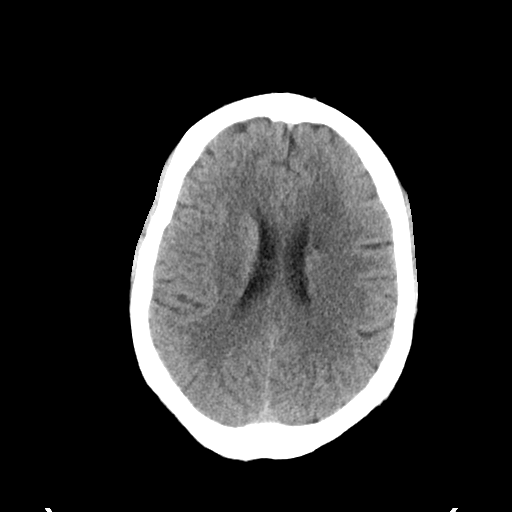
[im 22/30  brain]
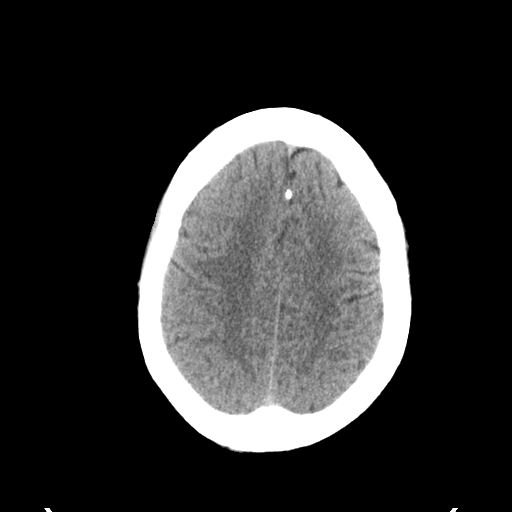
[im 25/30  brain]
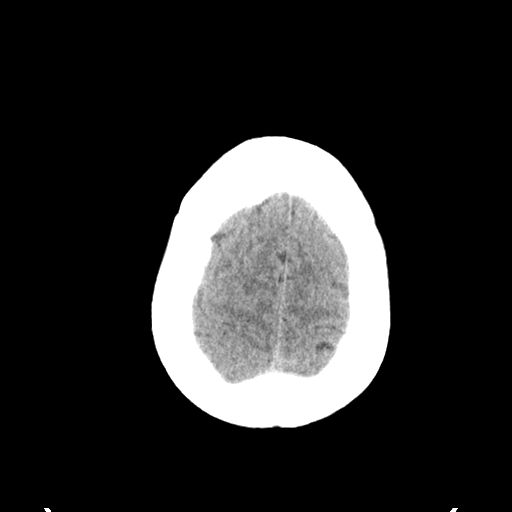
[im 28/30  brain]
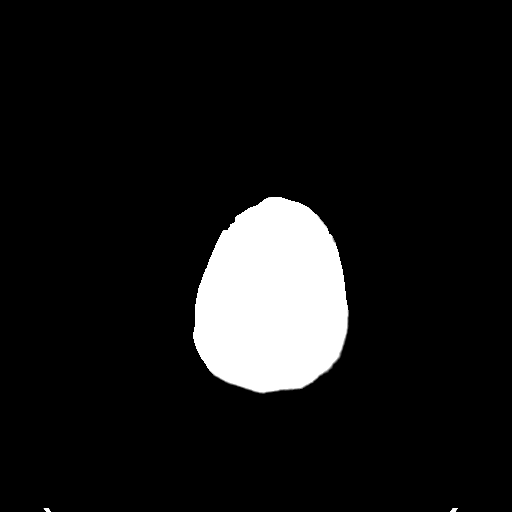
[im 28/30  bone]
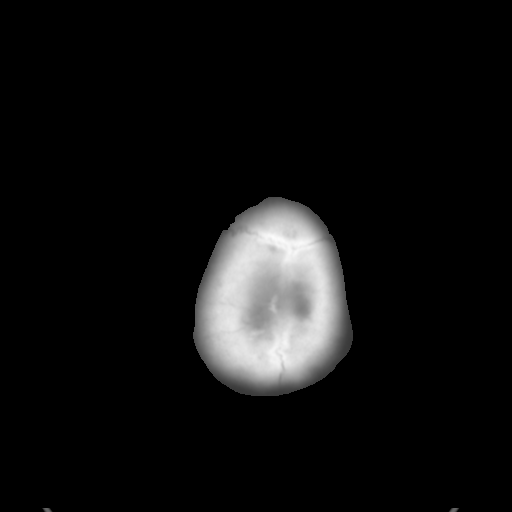

[Series 5: head 3.0 cor st · coronal · 0.33mm/px · 3 of 67 slices shown]
[im 23/67  brain]
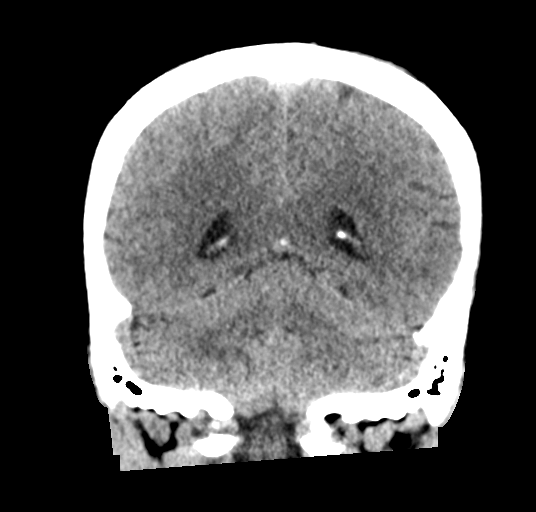
[im 30/67  brain]
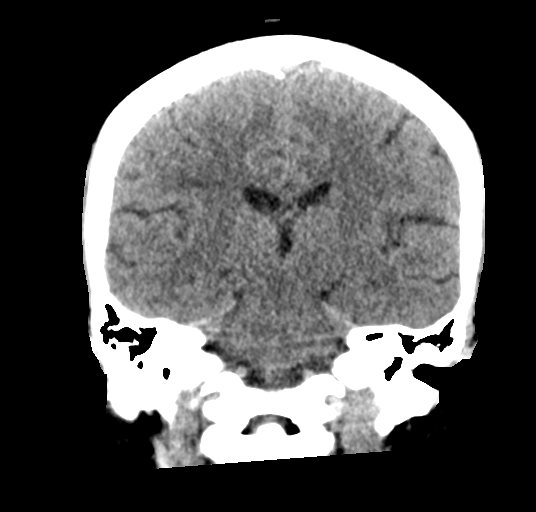
[im 37/67  brain]
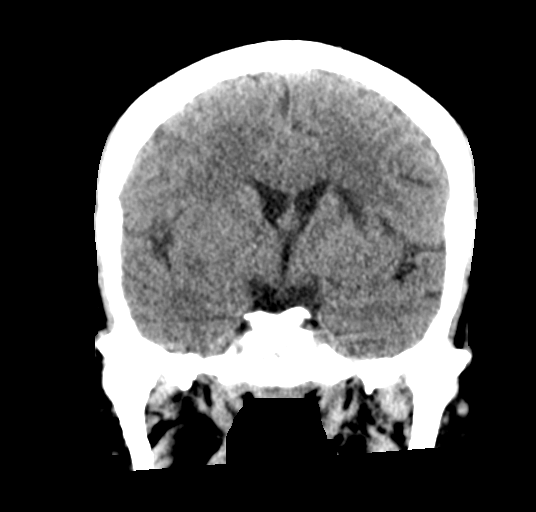

[Series 6: head 3.0 sag st · sagittal · 0.33mm/px · 3 of 62 slices shown]
[im 21/62  brain]
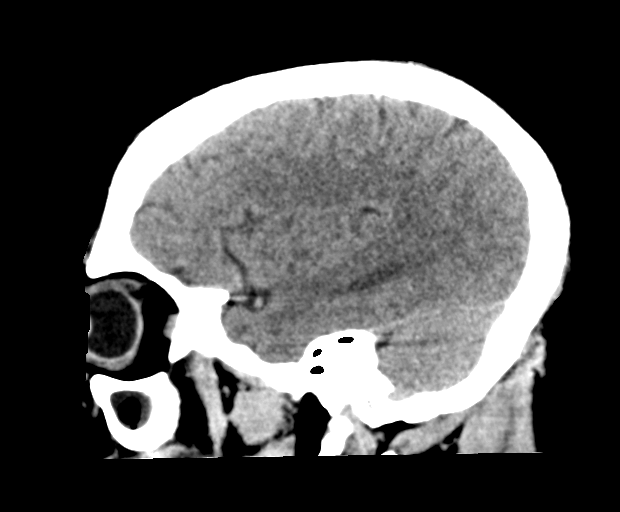
[im 31/62  brain]
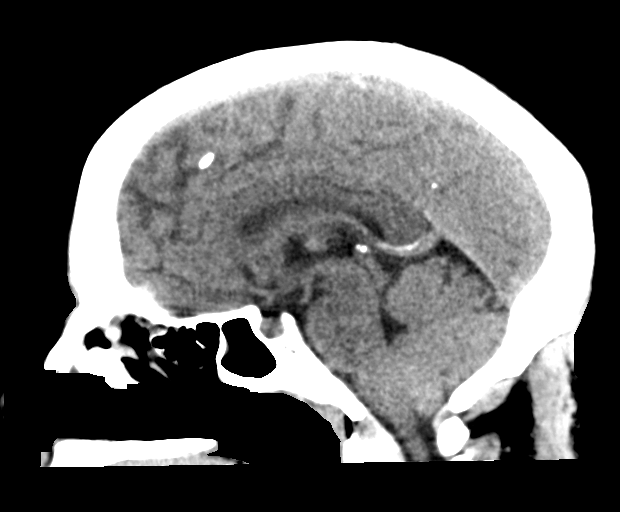
[im 41/62  brain]
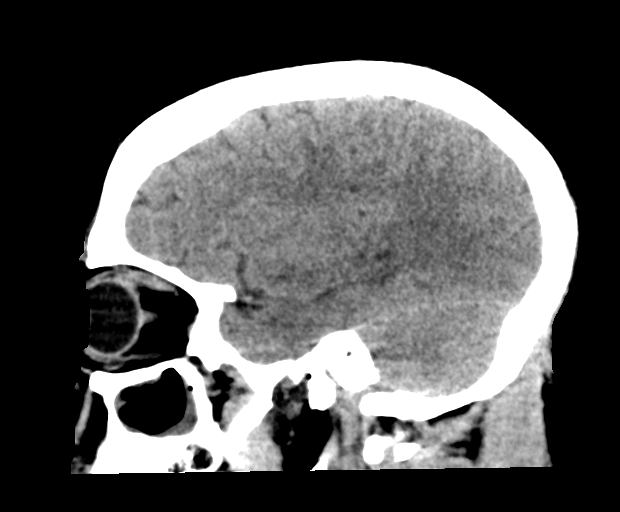

[15 of 47 positions shown; findings below may reference images not displayed]

FINDINGS: Brain: A lacunar infarct in the right caudate head is new and of
indeterminate acuity. There is a new chronic infarct involving the
left basal ganglia and corona radiata. Hypodensities elsewhere in
the cerebral white matter bilaterally are nonspecific but likely
reflect chronic small-vessel ischemia given vascular risk factors.
No acute intracranial hemorrhage, mass, midline shift, or
extra-axial fluid collection is identified.

Vascular: Calcified atherosclerosis at the skull base. No hyperdense
vessel.

Skull: No acute fracture or suspicious osseous lesion.

Sinuses/Orbits: Remote medial left orbital blowout fracture.
Moderately extensive right greater than left ethmoid air cell
opacification. Circumferential mucosal thickening and secretions in
the maxillary sinuses. Clear mastoid air cells.

Other: None.

ASPECTS ([place] Stroke Program Early CT Score)

- Ganglionic level infarction (caudate, lentiform nuclei, internal
capsule, insula, M1-M3 cortex): 6

- Supraganglionic infarction (M4-M6 cortex): 3

Total score (0-10 with 10 being normal): 9
IMPRESSION: 1. New right caudate infarct, age indeterminate but potentially
acute. ASPECTS of 9.
2. New chronic left basal ganglia infarct.
3. No acute intracranial hemorrhage.

These results were communicated to Dr. Mavadhiya at [DATE] on
05/23/2021 by text page via the [facility] messaging system.

## 2022-06-29 ENCOUNTER — Other Ambulatory Visit: Payer: Self-pay | Admitting: Critical Care Medicine

## 2022-07-23 ENCOUNTER — Telehealth: Payer: BC Managed Care – PPO | Admitting: Family Medicine

## 2022-07-23 ENCOUNTER — Encounter: Payer: Self-pay | Admitting: Family Medicine

## 2022-07-23 ENCOUNTER — Telehealth: Payer: BC Managed Care – PPO | Admitting: Physician Assistant

## 2022-07-23 DIAGNOSIS — H5789 Other specified disorders of eye and adnexa: Secondary | ICD-10-CM

## 2022-07-23 DIAGNOSIS — Z91199 Patient's noncompliance with other medical treatment and regimen due to unspecified reason: Secondary | ICD-10-CM

## 2022-07-23 NOTE — Progress Notes (Signed)
The patient no-showed for appointment despite this provider sending direct link, reaching out via phone with no response and waiting for at least 10 minutes from appointment time for patient to join. They will be marked as a NS for this appointment/time.   Kaycee Mcgaugh M Rolande Moe, NP    

## 2022-07-23 NOTE — Progress Notes (Signed)
[place]   Patient presenting with right eye redness, swelling of surrounding tissue, pain with moving eye to look to side, and vision changes.  She is encouraged to go be seen in person to have this assessed.  Patient acknowledged agreement and understanding of the plan.

## 2022-07-23 NOTE — Progress Notes (Signed)
Patient being seen via video visit today. No charge.

## 2022-07-23 NOTE — Progress Notes (Signed)
Message sent to patient requesting further input regarding current symptoms. Awaiting patient response.  

## 2022-07-23 NOTE — Patient Instructions (Signed)
Weber Cooks, thank you for joining Perlie Mayo, NP for today's virtual visit.  While this provider is not your primary care provider (PCP), if your PCP is located in our provider database this encounter information will be shared with them immediately following your visit.   [facility] account gives you access to today's visit and all your visits, tests, and labs performed at [facility] " click here if you don't have a [facility] account or go to [email or web address]  Consent: (Patient) Yorley Buch provided verbal consent for this virtual visit at the beginning of the encounter.  Current Medications:  Current Outpatient Medications:    ACCU-CHEK GUIDE test strip, USE UP TO FOUR TIMES DAILY AS DIRECTED, Disp: , Rfl:    Accu-Chek Softclix Lancets lancets, SMARTSIG:Topical 1-4 Times Daily, Disp: , Rfl:    amLODipine (NORVASC) 10 MG tablet, Take 1 tablet (10 mg total) by mouth daily., Disp: 90 tablet, Rfl: 1   aspirin EC 81 MG tablet, Take 1 tablet (81 mg total) by mouth daily. Swallow whole., Disp: 100 tablet, Rfl: 2   blood glucose meter kit and supplies KIT, Dispense based on patient and insurance preference. Use up to four times daily as directed., Disp: 1 each, Rfl: 0   buPROPion (WELLBUTRIN XL) 150 MG 24 hr tablet, TAKE 1 TABLET(150 MG) BY MOUTH DAILY, Disp: 30 tablet, Rfl: 2   citalopram (CELEXA) 10 MG tablet, Take 1 tablet (10 mg total) by mouth daily., Disp: 60 tablet, Rfl: 3   clopidogrel (PLAVIX) 75 MG tablet, TAKE 1 TABLET(75 MG) BY MOUTH DAILY, Disp: 90 tablet, Rfl: 1   diclofenac Sodium (VOLTAREN) 1 % GEL, Apply 4 g topically daily as needed (For knee pain)., Disp: 400 g, Rfl: 1   dicyclomine (BENTYL) 10 MG capsule, Take 1 capsule (10 mg total) by mouth 4 (four) times daily -  before meals and at bedtime. (Patient not taking: Reported on 04/18/2022), Disp: 40 capsule, Rfl: 0   glimepiride (AMARYL) 2 MG tablet, Take 1 tablet (2 mg total) by  mouth daily with breakfast., Disp: 60 tablet, Rfl: 2   Insulin Glargine (BASAGLAR KWIKPEN) 100 UNIT/ML, Inject 5 Units into the skin daily., Disp: 15 mL, Rfl: 2   Insulin Pen Needle 32G X 4 MM MISC, Use to inject insulin at bed time, Disp: 100 each, Rfl: 0   irbesartan (AVAPRO) 75 MG tablet, Take 1 tablet (75 mg total) by mouth daily., Disp: 60 tablet, Rfl: 2   meloxicam (MOBIC) 15 MG tablet, TAKE 1 TABLET(15 MG) BY MOUTH DAILY, Disp: 30 tablet, Rfl: 0   metoprolol succinate (TOPROL-XL) 25 MG 24 hr tablet, Take 3 tablets (75 mg total) by mouth daily., Disp: 180 tablet, Rfl: 1   rosuvastatin (CRESTOR) 20 MG tablet, Take 1 tablet (20 mg total) by mouth at bedtime., Disp: 60 tablet, Rfl: 2   Medications ordered in this encounter:  No orders of the defined types were placed in this encounter.    *If you need refills on other medications prior to your next appointment, please contact your pharmacy*  Follow-Up: Call back or seek an in-person evaluation if the symptoms worsen or if the condition fails to improve as anticipated.  [place] [phone]     If you have been instructed to have an in-person evaluation today at a local Urgent Care facility, please use the link below. It will take you to a list of all of our available [facility]gent Cares,  including address, phone number and hours of operation. Please do not delay care.  [facility]  If you or a family member do not have a primary care provider, use the link below to schedule a visit and establish care. When you choose a [facility] primary care physician or advanced practice provider, you gain a long-term partner in health. Find a Primary Care Provider  Learn more about [facility]'s in-office and virtual care options: [facility]

## 2022-07-31 ENCOUNTER — Encounter (HOSPITAL_COMMUNITY): Payer: Self-pay

## 2022-07-31 ENCOUNTER — Ambulatory Visit (HOSPITAL_COMMUNITY)
Admission: EM | Admit: 2022-07-31 | Discharge: 2022-07-31 | Disposition: A | Discharge status: Home / Self Care | Payer: BC Managed Care – PPO | Attending: Nurse Practitioner | Admitting: Nurse Practitioner

## 2022-07-31 DIAGNOSIS — H1032 Unspecified acute conjunctivitis, left eye: Secondary | ICD-10-CM | POA: Diagnosis not present

## 2022-07-31 DIAGNOSIS — R220 Localized swelling, mass and lump, head: Secondary | ICD-10-CM

## 2022-07-31 DIAGNOSIS — H04129 Dry eye syndrome of unspecified lacrimal gland: Secondary | ICD-10-CM

## 2022-07-31 MED ORDER — METHYLPREDNISOLONE 4 MG PO TBPK: ORAL_TABLET | ORAL | 0 refills | Status: AC

## 2022-07-31 MED ORDER — OLOPATADINE HCL 0.1 % OP SOLN
1.0000 [drp] | Freq: Two times a day (BID) | OPHTHALMIC | 12 refills | Status: DC
Start: 2022-07-31 — End: 2022-08-21

## 2022-07-31 MED ORDER — OFLOXACIN 0.3 % OP SOLN
1.0000 [drp] | Freq: Four times a day (QID) | OPHTHALMIC | 0 refills | Status: DC
Start: 2022-07-31 — End: 2022-08-21

## 2022-07-31 NOTE — ED Provider Notes (Signed)
[facility]    CSN: [id number] Arrival date & time: 07/31/22  1332      History   Chief Complaint Chief Complaint  Patient presents with   Facial Swelling    HPI Stacey Lucas is a 46 y.o. female.   HPI  He is in today for left sided facial pain that started on last week. She reports waking up with left facial swelling. She did not realize the stent of the swelling until her husband returned home. She was seen that day at She denies any fever, chills, headache, dizziness, head or nasal congestion, ear pain, shortness of breath or chest pain. She does having swelling her her legs but reports that this has been ongoing. She denies any change in medication or foods. She does not feel like she has been bitten but is not certain.   Past Medical History:  Diagnosis Date   Anxiety and depression    Cocaine abuse ([place])    Depression with suicidal ideation    [facility] admission 04/2018   Diabetes mellitus without complication (HCC)    Type II   GERD (gastroesophageal reflux disease)    Hyperlipidemia    Hypertension    Impingement syndrome of right shoulder    Migraine headache    Osteoarthritis of right knee 05/24/2021   Pilonidal abscess 05/2013   Pyelonephritis    Right thyroid nodule    Sciatica     Patient Active Problem List   Diagnosis Date Noted   Anemia 01/16/2022   Acute-on-chronic kidney injury ([place]) 01/16/2022   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 12/29/2021   Atrial fibrillation ([place]) 07/04/2021   H/O: stroke 07/04/2021   Cerebral thrombosis with cerebral infarction 05/24/2021   Osteoarthritis of right knee 05/24/2021   Type 2 diabetes mellitus without complication ([place]) 60/63/0160   Essential hypertension 05/23/2021   Hyperlipidemia 05/23/2021   Thyroid nodule 05/23/2021   MDD (major depressive disorder), recurrent severe, without psychosis ([place]) 04/27/2018    Past Surgical  History:  Procedure Laterality Date   BUBBLE STUDY  01/01/2022   Procedure: BUBBLE STUDY;  Surgeon: Janina Mayo, MD;  Location: [facility];  Service: Cardiovascular;;   TEE WITHOUT CARDIOVERSION N/A 01/01/2022   Procedure: TRANSESOPHAGEAL ECHOCARDIOGRAM (TEE);  Surgeon: Janina Mayo, MD;  Location: [facility] ENDOSCOPY;  Service: Cardiovascular;  Laterality: N/A;   tubal      OB History   No obstetric history on file.      Home Medications    Prior to Admission medications   Medication Sig Start Date End Date Taking? Authorizing Provider  methylPREDNISolone (MEDROL) 4 MG TBPK tablet Follow package instructions. 07/31/22 08/06/22 Yes English Tomer, Diona Foley, NP  ofloxacin (OCUFLOX) 0.3 % ophthalmic solution Place 1 drop into the left eye 4 (four) times daily. 07/31/22  Yes Aidynn Krenn, Diona Foley, NP  olopatadine (PATADAY) 0.1 % ophthalmic solution Place 1 drop into both eyes 2 (two) times daily. 07/31/22  Yes Vevelyn Francois, NP  ACCU-CHEK GUIDE test strip USE UP TO FOUR TIMES DAILY AS DIRECTED 01/31/22   [provider]  Accu-Chek Softclix Lancets lancets SMARTSIG:Topical 1-4 Times Daily 01/31/22   [provider]  amLODipine (NORVASC) 10 MG tablet Take 1 tablet (10 mg total) by mouth daily. 04/18/22   Elsie Stain, MD  aspirin EC 81 MG tablet Take 1 tablet (81 mg total) by mouth daily. Swallow whole. 04/18/22   Elsie Stain, MD  blood glucose meter kit and supplies KIT Dispense based on patient and insurance preference. Use up to four times daily as directed. 01/31/22   [name], MD  buPROPion (WELLBUTRIN XL) 150 MG 24 hr tablet TAKE 1 TABLET(150 MG) BY MOUTH DAILY 04/18/22   Elsie Stain, MD  citalopram (CELEXA) 10 MG tablet Take 1 tablet (10 mg total) by mouth daily. 04/18/22   Elsie Stain, MD  clopidogrel (PLAVIX) 75 MG tablet TAKE 1 TABLET(75 MG) BY MOUTH DAILY 05/15/22   Elsie Stain, MD  diclofenac Sodium (VOLTAREN) 1 % GEL Apply 4 g topically daily as  needed (For knee pain). 04/18/22   Elsie Stain, MD  dicyclomine (BENTYL) 10 MG capsule Take 1 capsule (10 mg total) by mouth 4 (four) times daily -  before meals and at bedtime. Patient not taking: Reported on 04/18/2022 03/29/22   Mar Daring, PA-C  glimepiride (AMARYL) 2 MG tablet Take 1 tablet (2 mg total) by mouth daily with breakfast. 04/18/22   Elsie Stain, MD  Insulin Glargine [facility]) 100 UNIT/ML Inject 5 Units into the skin daily. 04/18/22   Elsie Stain, MD  Insulin Pen Needle 32G X 4 MM MISC Use to inject insulin at bed time 04/18/22   Elsie Stain, MD  irbesartan (AVAPRO) 75 MG tablet Take 1 tablet (75 mg total) by mouth daily. 04/18/22   Elsie Stain, MD  meloxicam (MOBIC) 15 MG tablet TAKE 1 TABLET(15 MG) BY MOUTH DAILY 07/01/22   Elsie Stain, MD  metoprolol succinate (TOPROL-XL) 25 MG 24 hr tablet Take 3 tablets (75 mg total) by mouth daily. 04/18/22   Elsie Stain, MD  rosuvastatin (CRESTOR) 20 MG tablet Take 1 tablet (20 mg total) by mouth at bedtime. 04/18/22   Elsie Stain, MD    Family History Family History  Problem Relation Age of Onset   Diabetes Mother    Hypertension Mother    Arthritis Mother    Diabetes Father    Hypertension Father     Social History Social History   Tobacco Use   Smoking status: Former    Packs/day: 1.00    Types: Cigarettes    Quit date: 01/16/2022    Years since quitting: 0.5   Smokeless tobacco: Never  Vaping Use   Vaping Use: Never used  Substance Use Topics   Alcohol use: Yes    Comment: occasionally   Drug use: No     Allergies   Gabapentin   Review of Systems Review of Systems   Physical Exam Triage Vital Signs ED Triage Vitals [07/31/22 1413]  Enc Vitals Group     BP 118/73     Pulse Rate 65     Resp 16     Temp 98.2 F (36.8 C)     Temp Source Oral     SpO2 98 %     Weight      Height      Head Circumference      Peak Flow      Pain Score 0      Pain Loc      Pain Edu?      Excl. in [place]?    No data found.  Updated Vital Signs BP 118/73 (BP Location: Left Arm)   Pulse 65   Temp 98.2 F (36.8 C) (Oral)   Resp 16   LMP 10/11/2019   SpO2 98%   Visual Acuity Right Eye Distance:  Left Eye Distance:   Bilateral Distance:    Right Eye Near:   Left Eye Near:    Bilateral Near:     Physical Exam Constitutional:      Appearance: She is obese.  HENT:     Head: Normocephalic and atraumatic.     Right Ear: Tympanic membrane normal.     Left Ear: Tympanic membrane normal.     Nose: Nose normal.     Mouth/Throat:     Mouth: Mucous membranes are dry.  Cardiovascular:     Rate and Rhythm: Normal rate and regular rhythm.     Pulses: Normal pulses.     Heart sounds: Normal heart sounds.  Pulmonary:     Effort: Pulmonary effort is normal.     Breath sounds: Normal breath sounds.  Musculoskeletal:     Cervical back: Normal range of motion.     Comments: Walker in use able to get on exam table unassisted  Skin:    General: Skin is warm and dry.     Capillary Refill: Capillary refill takes less than 2 seconds.  Neurological:     General: No focal deficit present.     Mental Status: She is alert and oriented to person, place, and time.     Cranial Nerves: No cranial nerve deficit.     Motor: Weakness (unchanged from previous stroke) present.     Coordination: Coordination abnormal (unchanged from previous stroke).  Psychiatric:        Mood and Affect: Mood normal.        Behavior: Behavior normal.      UC Treatments / Results  Labs (all labs ordered are listed, but only abnormal results are displayed) Labs Reviewed - No data to display  EKG   Radiology No results found.  Procedures Procedures (including critical care time)  Medications Ordered in UC Medications - No data to display  Initial Impression / Assessment and Plan / UC Course  I have reviewed the triage vital signs and the nursing  notes.  Pertinent labs & imaging results that were available during my care of the patient were reviewed by me and considered in my medical decision making (see chart for details).     Conjunctivitis  Final Clinical Impressions(s) / UC Diagnoses   Final diagnoses:  Acute conjunctivitis of left eye, unspecified acute conjunctivitis type  Facial swelling  Dry eye     Discharge Instructions      Conjunctivitis to left eye with facial swelling. I encourage you to trial this for 48 hours if your facial swelling improves then hold off on any other treatment.  If facial swelling persist then a low dose steroid; Medrol Dosepak has been prescribed. As discussed this will cause an increase in your blood sugar. Continue your current diabetes treatment as prescribed.  Pataday can be used for allergy eye after completion of Ofloxacin 0.3%/  Follow up with PCP in one week or sooner if symptoms persist or get worse.       ED Prescriptions     Medication Sig Dispense Auth. Provider   ofloxacin (OCUFLOX) 0.3 % ophthalmic solution Place 1 drop into the left eye 4 (four) times daily. 5 mL Vevelyn Francois, NP   methylPREDNISolone (MEDROL) 4 MG TBPK tablet Follow package instructions. 21 tablet Dionisio David M, NP   olopatadine (PATADAY) 0.1 % ophthalmic solution Place 1 drop into both eyes 2 (two) times daily. 5 mL Vevelyn Francois, NP  PDMP not reviewed this encounter.   Dionisio David [place], [place] 07/31/22 1535

## 2022-07-31 NOTE — ED Triage Notes (Signed)
Pt states left sided facial swelling for the past week.

## 2022-07-31 NOTE — Discharge Instructions (Addendum)
Conjunctivitis to left eye with facial swelling. I encourage you to trial this for 48 hours if your facial swelling improves then hold off on any other treatment.  If facial swelling persist then a low dose steroid; Medrol Dosepak has been prescribed. As discussed this will cause an increase in your blood sugar. Continue your current diabetes treatment as prescribed.  Pataday can be used for allergy eye after completion of Ofloxacin 0.3%/  Follow up with PCP in one week or sooner if symptoms persist or get worse.

## 2022-08-14 ENCOUNTER — Ambulatory Visit: Payer: BC Managed Care – PPO | Admitting: Critical Care Medicine

## 2022-08-14 NOTE — Progress Notes (Deleted)
New Patient Office Visit  Subjective    Patient ID: Stacey Lucas, female    DOB: 08-27-1976  Age: 46 y.o. MRN: [id number]  CC:  No chief complaint on file.   HPI Stacey Lucas presents to establish care 6/27 This is a 46 year old female former cocaine user with severe hypertension was admitted for acute stroke in April 2023 she was eventually discharged from acute inpatient rehab May 11.  Below are both discharge summaries.  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  She now is living at home and has an aide which is a friend of hers who is a Chief Executive Officer.  Patient is requesting personal care services and would like a Dexcom meter.  History of type 2 diabetes she was sent out without any insulin but given Amaryl only.  The concern was she might overdose and be hypoglycemic with her recent stroke.  Patient currently is taking the Amaryl only.  On arrival blood sugar was 230.  Blood pressure 113/74 The patient struggles with the diet.  She eats [place] sausage and grits and eggs in the morning to skips lunch and/or-is fast food in the evening.  Patient had a urinary tract infection during the last hospitalization but has not no symptoms now.  She has no other real complaints at this time except for numbness in the right foot and pain in the right knee.  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  She is requesting personal care services to help with bathing and ADLs.  She has difficulty accessing healthy foods.  There was a delay in getting her appointment in our clinic and she did miss 1 appointment due to communication breakdown.  Luckily rehab did agree to bridge her medications at this visit. 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 is a 46 y.o. 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 She had near resolution of original symptoms (left side weakness) and on the way to MRI developed right side weakness, dysarthria, and a facial droop. MRI was aborted and TNKase was given in ED. MRI today shows an acute infarct of the left corona radiata/basal ganglia.  DAPT therapy started 24 hours post TNK.  DVT prophylaxis with Lovenox. TCD and TEE negative.     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 Small vessel disease. Atrophy. ASPECTS 10.    CTA head & neck Atherosclerotic irregularity of the M2 and more distal MCA vessels, bilaterally. Most notably, there is an apparent moderate/severe focal stenosis within a superior division mid M2 left MCA vessel. MRI  2.4 x  1.1 x 2.0 cm acute infarct within the left corona radiata/basal ganglia. 2D Echo EF 70-75% with grade I diastolic dysfunction TCD and TEE negative LDL 117 HgbA1c 7.1 VTE prophylaxis - lovenox  No antithrombotic prior to admission, now on aspirin 81 mg daily and clopidogrel 75 mg daily.  For 3 weeks and then aspirin alone. Therapy recommendations:  CIR Disposition:  pending   Hypertension Home meds:  valsartan, toprol  XL Stable Permissive hypertension (OK if < 220/120) but gradually normalize in 5-7 days Long-term BP goal normotensive   Hyperlipidemia Home meds:  rosuvastatin 60m LDL 117, goal < 70 Increase to rosuvastatin 231mContinue statin at discharge   Diabetes type II Controlled Home meds:  Insulin HgbA1c 7.1, goal < 7.0 CBGs SSI- increased with meal coverage   Other Stroke Risk Factors Hx stroke/TIA 05/2021-right caudate infarct secondary to small vessel disease Cigarette smoker, advised to stop smoking Cocaine use Migraines Currently not on any medications   Other Active Problems UTI on 3d Rocephin, Ucx greater than 100,000 colonies of group B strep sensitivity pending GERD Anxiety/depression Incidental thyroid nodule: follow up with PCP Noted during previous hospital stay  Rehab: Admit date: 01/02/2022 Discharge date: 01/17/2022   Discharge Diagnoses:  Principal Problem:   CVA (cerebral vascular accident) ([facility] Problems:   Type 2 diabetes mellitus without complication ([facility]  Essential hypertension   Hyperlipidemia   Thyroid nodule   Anemia   Acute-on-chronic kidney injury ([facility] Brief HPI:   Stacey Lucas a [age].o. female with history of uncontrolled T2DM with neuropathy/RLE weakness, CVA 09/22, thyroid nodule, depression, ongoing cocaine abuse who was admitted on 12/29/2021 with left-sided weakness, left facial weakness and slurred speech which was transient.  She then developed near flaccidity of R UE, RLE greater than LLE drift, slurred speech and right facial droop while in ED.  UDS was positive for cocaine.  CTA head neck was negative for LVO.  Blood pressure noted to be elevated and was treated with IV labetalol and an Cleviprex.  She received TNK and follow-up MRI brain showed acute infarct in left corona radiata, basal ganglia and chronic small vessel disease.  2D echo showed EF of 70 to 75% with mild LVH.     Follow-up TEE was negative for intra-atrial  shunt, thrombus and negative bubble study.  EEG was negative for seizures.  Hospital course was significant for acute on chronic renal failure with rise in serum creatinine to 1.66.  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 SeLeonie Manecommended DAPT x3 weeks followed by aspirin alone.  She continues to be limited by right-sided weakness with left inattention, right lean as well as right knee instability with standing attempts.  CIR was recommended due to functional decline.     Hospital Course: TeBrynlie Dazaas admitted to rehab 01/02/2022 for inpatient therapies to consist of PT, ST and OT at least three hours five days a week. Past admission physiatrist, therapy team and rehab RN have worked together to provide customized collaborative inpatient rehab.  Her blood pressures were monitored on TID basis and have been well controlled.  Serial check of electrolytes showed acute on chronic chronic renal failure therefore Avapro was just decreased to 75 amlodipine was added and titrated to 10 mg/day.  She has had recurrent rise in his serum creatinine was advised to increase p.o. fluids recommendations to have repeat labs monitoring of renal status.  Has been stable and she has shown good  participation with therapy.  She was started on bupropion to help with anxiety as well as quit smoking.  Gabapentin for $00 mg twice daily was added to assist with cocaine cessation however patient was unable to tolerate this due to extreme fatigue/balance issues therefore this was discontinued   Follow-up CBC showed H&H to be stable without signs of bleeding.  She was maintained on DAPT during her stay after 3 days.  Her diabetes has been monitored with ac/hs CBG checks and SSI was use prn for tighter BS control.  Low-dose Amaryl was added due to CKD in addition to insulin glargine 5 units at bedtime.  Her p.o. intake has been good and blood sugars are relatively controlled but will need further adjustment on  outpatient basis. Patient and fiancee have been educated on effects of tobacco, marijuana/cocaine on brain and it's stroke risk as well as importance of medical/medication compliance. She has been set up with Dr. Wynetta Emery for primary care follow up.  She has made good gains during her rehab stay and requires intermittent supervision for mobility.  She will continue to receive follow-up outpatient PT and OT at [facility] neuro rehab after discharge.     Rehab course: During patient's stay in rehab weekly team conferences were held to monitor patient's progress, set goals and discuss barriers to discharge. At admission, patient required min to mod assist with ADL tasks and mod assist with mobility. She exhibited mild cognitive impairments with acute mild dysarthria and deficits.  She  has had improvement in activity tolerance, balance, postural control as well as ability to compensate for deficits.  She has had improvement in functional use RUE  and RLE as well as improvement in awareness. She is able to complete ADL tasks with CGA to min assist. She is modified independent for transfers and to ambulate 150' with RW.  She requires CGA to climb 12 stairs. Speech is intelligible and she is able to complete cognitive tasks with repetition as well as extra time for processing. Family education was completed.    Discharge disposition: 01-Home or Self Care  Below is the last rehab visit outpatient which occurred May 25 1. Functional deficits secondary to left corona radiata infarct. Cocaine related vasculopathy             -Continue outpatient PT/OT            -Order placed for shower bench             -Home health aid requested, order placed but she does not qualify at this time due to lack of skilled home need     2. HTN well-controlled today -continue Avapro and metoprolol -Metoprolol was refilled, follow-up with primary care physician     3. T2DM -Continue current medications, glucometer was ordered      4.  Polysubstance use with history of tobacco abuse , cocaine use -Continue buproprion, congratulated her on not smoking tobacco or using other illicit substances   5.  Right knee pain -Suspect patellofemoral syndrome, Pt to work on this with PT,  Can use knee brace and continue Voltaren gel   Below is a phone note from rehab 6/22 phone note: Ms Grismer's fiance showed up in the office stating she does not have appt with PCP until 03/05/22 and they did not get her amlodipine refill and the plavix. I have reviewed chart and the refill on the plavix was sent in but only 4 tablets (she had 2 separate rx from [facility]  [place]). I have refilled the amlodipine 30 d x1 and the plavix 30 d x1 with no further refills from our office. She no showed  her scheduled appt with PCP 02/19/22 and it has been rescheduled to 03/05/22 with Asencion Noble MD. She has not seen Dr Leonie Man and no appt scheduled. I reviewed the referral and it looks like the [place] office has been unable to reach the patient to set the appt. I reprinted the AVS from the inpatient discharge and highlighted [facility] address and phone # for them to call when he gets home!!! (she was on the phone speaker while he was here). I also highlighted the PCP appt and reinforced they cannot miss that appointment for we can not refill her meds again. They both verbalized understanding.   There is a history of smoking patient is smoking less at this time she was on bupropion for this also given Celexa for depression she states her depression is at baselineAnd is stable  8/10 This patient is seen in return follow-up from stroke symptoms she is yet to see neurology.  She has run out of her clopidogrel and is not taking it currently.  The plan was to stop after 3 months however when she did stop the medicine she noted increased weakness in the right arm and leg.  She is still using the walker.  She is completed her rehab.  She was followed by physical  medicine. Patient states she gets cold in the right arm and right lower extremity at times.  On arrival blood pressure is well-controlled 118/70.  She maintains Iber Sartain, amlodipine, and metoprolol.  Patient also maintains low-dose aspirin and Crestor.  Patient does maintain low-dose insulin glargine 5 units daily however she has not picked up a recent refill of this.  For her diabetes she maintains Amaryl alone.  The patient does not monitor her blood sugars on a regular basis.  On arrival blood sugar is 259 The patient receive the Cologuard kit but could not process that she is having too many loose stools  12/6 Essential hypertension       Blood pressure at goal no change in medications refills issued        Relevant Medications    amLODipine (NORVASC) 10 MG tablet    aspirin EC 81 MG tablet    irbesartan (AVAPRO) 75 MG tablet    metoprolol succinate (TOPROL-XL) 25 MG 24 hr tablet    rosuvastatin (CRESTOR) 20 MG tablet    Cerebral thrombosis with cerebral infarction    Relevant Medications    amLODipine (NORVASC) 10 MG tablet    aspirin EC 81 MG tablet    irbesartan (AVAPRO) 75 MG tablet    metoprolol succinate (TOPROL-XL) 25 MG 24 hr tablet    rosuvastatin (CRESTOR) 20 MG tablet    Other Relevant Orders    Ambulatory referral to Neurology    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      I have resumed the patient's clopidogrel she is to take this daily along with aspirin until we can get her into neurology for further assessments   Patient to maintain low-dose Crestor   Referral back to neurology was made            Relevant Medications    amLODipine (NORVASC) 10 MG tablet    aspirin EC 81 MG tablet    irbesartan (AVAPRO) 75 MG tablet    metoprolol succinate (  TOPROL-XL) 25 MG 24 hr tablet    rosuvastatin (CRESTOR) 20 MG tablet        Endocrine    Type 2 diabetes mellitus without complication (HCC)  - Primary      Poorly controlled diabetes referral back to clinical pharmacy made continue insulin and Amaryl        Relevant Medications    aspirin EC 81 MG tablet    irbesartan (AVAPRO) 75 MG tablet    rosuvastatin (CRESTOR) 20 MG tablet    glimepiride (AMARYL) 2 MG tablet    Insulin Glargine (BASAGLAR KWIKPEN) 100 UNIT/ML    Other Relevant Orders    POCT glucose (manual entry) (Completed)    Urine microalbumin-creatinine with uACR        Genitourinary    Acute-on-chronic kidney injury ([place])      Renal function stable we will monitor            Other    Hyperlipidemia      Continue Crestor        Relevant Medications    amLODipine (NORVASC) 10 MG tablet    aspirin EC 81 MG tablet    irbesartan (AVAPRO) 75 MG tablet    metoprolol succinate (TOPROL-XL) 25 MG 24 hr tablet    rosuvastatin (CRESTOR) 20 MG tablet   Outpatient Encounter Medications as of 08/14/2022  Medication Sig   ACCU-CHEK GUIDE test strip USE UP TO FOUR TIMES DAILY AS DIRECTED   Accu-Chek Softclix Lancets lancets SMARTSIG:Topical 1-4 Times Daily   amLODipine (NORVASC) 10 MG tablet Take 1 tablet (10 mg total) by mouth daily.   aspirin EC 81 MG tablet Take 1 tablet (81 mg total) by mouth daily. Swallow whole.   blood glucose meter kit and supplies KIT Dispense based on patient and insurance preference. Use up to four times daily as directed.   buPROPion (WELLBUTRIN XL) 150 MG 24 hr tablet TAKE 1 TABLET(150 MG) BY MOUTH DAILY   citalopram (CELEXA) 10 MG tablet Take 1 tablet (10 mg total) by mouth daily.   clopidogrel (PLAVIX) 75 MG tablet TAKE 1 TABLET(75 MG) BY MOUTH DAILY   diclofenac Sodium (VOLTAREN) 1 % GEL Apply 4 g topically daily as needed (For knee pain).   dicyclomine (BENTYL) 10 MG capsule Take 1 capsule (10 mg total) by mouth 4 (four) times daily -  before meals and at bedtime. (Patient not taking: Reported on 04/18/2022)   glimepiride (AMARYL) 2 MG tablet Take 1 tablet (2 mg total) by mouth daily  with breakfast.   Insulin Glargine (BASAGLAR KWIKPEN) 100 UNIT/ML Inject 5 Units into the skin daily.   Insulin Pen Needle 32G X 4 MM MISC Use to inject insulin at bed time   irbesartan (AVAPRO) 75 MG tablet Take 1 tablet (75 mg total) by mouth daily.   meloxicam (MOBIC) 15 MG tablet TAKE 1 TABLET(15 MG) BY MOUTH DAILY   metoprolol succinate (TOPROL-XL) 25 MG 24 hr tablet Take 3 tablets (75 mg total) by mouth daily.   ofloxacin (OCUFLOX) 0.3 % ophthalmic solution Place 1 drop into the left eye 4 (four) times daily.   olopatadine (PATADAY) 0.1 % ophthalmic solution Place 1 drop into both eyes 2 (two) times daily.   rosuvastatin (CRESTOR) 20 MG tablet Take 1 tablet (20 mg total) by mouth at bedtime.   No facility-administered encounter medications on file as of 08/14/2022.    Past Medical History:  Diagnosis Date   Anxiety and depression    Cocaine abuse ([place])  Depression with suicidal ideation    [facility] admission 04/2018   Diabetes mellitus without complication (HCC)    Type II   GERD (gastroesophageal reflux disease)    Hyperlipidemia    Hypertension    Impingement syndrome of right shoulder    Migraine headache    Osteoarthritis of right knee 05/24/2021   Pilonidal abscess 05/2013   Pyelonephritis    Right thyroid nodule    Sciatica     Past Surgical History:  Procedure Laterality Date   BUBBLE STUDY  01/01/2022   Procedure: BUBBLE STUDY;  Surgeon: Janina Mayo, MD;  Location: [facility];  Service: Cardiovascular;;   TEE WITHOUT CARDIOVERSION N/A 01/01/2022   Procedure: TRANSESOPHAGEAL ECHOCARDIOGRAM (TEE);  Surgeon: Janina Mayo, MD;  Location: [facility] ENDOSCOPY;  Service: Cardiovascular;  Laterality: N/A;   tubal      Family History  Problem Relation Age of Onset   Diabetes Mother    Hypertension Mother    Arthritis Mother    Diabetes Father    Hypertension Father     Social History   Socioeconomic History   Marital status: Single    Spouse name: Not on file    Number of children: 3   Years of education: Not on file   Highest education level: Not on file  Occupational History   Not on file  Tobacco Use   Smoking status: Former    Packs/day: 1.00    Types: Cigarettes    Quit date: 01/16/2022    Years since quitting: 0.5   Smokeless tobacco: Never  Vaping Use   Vaping Use: Never used  Substance and Sexual Activity   Alcohol use: Yes    Comment: occasionally   Drug use: No   Sexual activity: Yes    Birth control/protection: None  Other Topics Concern   Not on file  Social History Narrative   Lives with children   "lots of caffeine"   Social Determinants of Health   Financial Resource Strain: Not on file  Food Insecurity: Not on file  Transportation Needs: Not on file  Physical Activity: Not on file  Stress: Not on file  Social Connections: Not on file  Intimate Partner Violence: Not on file    Review of Systems  Constitutional:  Negative for chills, diaphoresis, fever, malaise/fatigue and weight loss.  HENT:  Negative for congestion, hearing loss, nosebleeds, sore throat and tinnitus.   Eyes:  Negative for blurred vision, photophobia and redness.  Respiratory:  Negative for cough, hemoptysis, sputum production, shortness of breath, wheezing and stridor.   Cardiovascular:  Negative for chest pain, palpitations, orthopnea, claudication, leg swelling and PND.  Gastrointestinal:  Negative for abdominal pain, blood in stool, constipation, diarrhea, heartburn, nausea and vomiting.  Genitourinary:  Negative for dysuria, flank pain, frequency, hematuria and urgency.  Musculoskeletal:  Positive for joint pain. Negative for back pain, falls, myalgias and neck pain.       Gait difficulty  Skin:  Negative for itching and rash.  Neurological:  Positive for tingling, sensory change, focal weakness and weakness. Negative for dizziness, tremors, speech change, seizures, loss of consciousness and headaches.  Endo/Heme/Allergies:  Negative for  environmental allergies and polydipsia. Does not bruise/bleed easily.  Psychiatric/Behavioral: Negative.  Negative for depression, memory loss, substance abuse and suicidal ideas. The patient is not nervous/anxious and does not have insomnia.         Objective    LMP 10/11/2019   Physical Exam Vitals reviewed.  Constitutional:  Appearance: Normal appearance. She is well-developed and normal weight. She is not diaphoretic.  HENT:     Head: Normocephalic and atraumatic.     Nose: No nasal deformity, septal deviation, mucosal edema or rhinorrhea.     Right Sinus: No maxillary sinus tenderness or frontal sinus tenderness.     Left Sinus: No maxillary sinus tenderness or frontal sinus tenderness.     Mouth/Throat:     Pharynx: No oropharyngeal exudate.  Eyes:     General: No scleral icterus.    Conjunctiva/sclera: Conjunctivae normal.     Pupils: Pupils are equal, round, and reactive to light.  Neck:     Thyroid: No thyromegaly.     Vascular: No carotid bruit or JVD.     Trachea: Trachea normal. No tracheal tenderness or tracheal deviation.  Cardiovascular:     Rate and Rhythm: Normal rate and regular rhythm.     Chest Wall: PMI is not displaced.     Pulses: Normal pulses. No decreased pulses.     Heart sounds: Normal heart sounds, S1 normal and S2 normal. Heart sounds not distant. No murmur heard.    No systolic murmur is present.     No diastolic murmur is present.     No friction rub. No gallop. No S3 or S4 sounds.  Pulmonary:     Effort: No tachypnea, accessory muscle usage or respiratory distress.     Breath sounds: No stridor. No decreased breath sounds, wheezing, rhonchi or rales.  Chest:     Chest wall: No tenderness.  Abdominal:     General: Bowel sounds are normal. There is no distension.     Palpations: Abdomen is soft. Abdomen is not rigid.     Tenderness: There is no abdominal tenderness. There is no guarding or rebound.  Musculoskeletal:        General:  Normal range of motion.     Cervical back: Normal range of motion and neck supple. No edema, erythema or rigidity. No muscular tenderness. Normal range of motion.     Comments: Foot exam normal  Lymphadenopathy:     Head:     Right side of head: No submental or submandibular adenopathy.     Left side of head: No submental or submandibular adenopathy.     Cervical: No cervical adenopathy.  Skin:    General: Skin is warm and dry.     Coloration: Skin is not pale.     Findings: No rash.     Nails: There is no clubbing.  Neurological:     Mental Status: She is alert and oriented to person, place, and time. Mental status is at baseline.     Cranial Nerves: No cranial nerve deficit.     Sensory: Sensory deficit present.     Motor: Weakness present.     Coordination: Coordination abnormal.     Gait: Gait abnormal.     Deep Tendon Reflexes: Reflexes normal.  Psychiatric:        Mood and Affect: Mood normal.        Speech: Speech normal.        Behavior: Behavior normal.        Thought Content: Thought content normal.        Judgment: Judgment normal.         SIGNIFICANT DIAGNOSTIC STUDIES  Imaging Results  CT HEAD WO CONTRAST (5MM)   Result Date: 12/31/2021 CLINICAL DATA:  Stroke, follow-up EXAM: CT HEAD WITHOUT CONTRAST TECHNIQUE: Contiguous axial images  were obtained from the base of the skull through the vertex without intravenous contrast. RADIATION DOSE REDUCTION: This exam was performed according to the departmental dose-optimization program which includes automated exposure control, adjustment of the mA and/or kV according to patient size and/or use of iterative reconstruction technique. COMPARISON:  12/29/2021 FINDINGS: Brain: Evolving recent infarction involving the left corona radiata and basal ganglia. Chronic bilateral basal ganglia infarcts. Additional patchy hypoattenuation in the supratentorial white matter likely reflects nonspecific gliosis/demyelination. No acute  intracranial hemorrhage or mass effect. Ventricles are normal in size. No extra-axial collection. Vascular: There is mild atherosclerotic calcification at the skull base. Skull: Calvarium is unremarkable. Sinuses/Orbits: No acute finding. Other: None. IMPRESSION: Evolving recent infarction of left corona radiata and basal ganglia. No acute intracranial hemorrhage. Chronic infarcts and chronic microvascular ischemic changes. Electronically Signed   By: Macy Mis M.D.   On: 12/31/2021 10:52    CT HEAD WO CONTRAST (5MM)   Result Date: 12/08/2021 CLINICAL DATA:  Headache, chronic, no new features EXAM: CT HEAD WITHOUT CONTRAST TECHNIQUE: Contiguous axial images were obtained from the base of the skull through the vertex without intravenous contrast. RADIATION DOSE REDUCTION: This exam was performed according to the departmental dose-optimization program which includes automated exposure control, adjustment of the mA and/or kV according to patient size and/or use of iterative reconstruction technique. COMPARISON:  05/23/2021 FINDINGS: Brain: No evidence of acute infarction, hemorrhage, hydrocephalus, extra-axial collection or mass lesion/mass effect. Chronic bilateral basal ganglia lacunar infarcts. Mild scattered low-density changes within the periventricular and subcortical white matter compatible with chronic microvascular ischemic change. Vascular: No hyperdense vessel or unexpected calcification. Skull: Normal. Negative for fracture or focal lesion. Sinuses/Orbits: No acute finding. Other: None. IMPRESSION: 1. No acute intracranial abnormality. 2. Mild chronic microvascular ischemic change. Electronically Signed   By: Davina Poke D.O.   On: 12/08/2021 13:46    MR BRAIN WO CONTRAST   Result Date: 12/30/2021 CLINICAL DATA:  Provided history: Stroke, follow-up. EXAM: MRI HEAD WITHOUT CONTRAST TECHNIQUE: Multiplanar, multiecho pulse sequences of the brain and surrounding structures were obtained without  intravenous contrast. COMPARISON:  CT angiogram head/neck 12/29/2021. Noncontrast head CT 12/29/2021. Brain MRI 05/23/2021. FINDINGS: Brain: The patient was unable to tolerate the full examination. As a result, axial T1 weighted and coronal T2 TSE sequences could not be obtained. The acquired sequences are intermittently motion degraded. Most notably, there is severe motion degradation of the axial SWI sequence. No age advanced or lobar predominant parenchymal atrophy. 2.4 x 1.1 x 2.0 cm acute infarct within the left corona radiata/basal ganglia. Some residual diffusion weighted hyperintensity at site of a chronic infarct within the right corona radiata. Chronic lacunar infarcts within the bilateral corona radiata/deep gray nuclei. Moderate (and significantly advanced for age) multifocal T2 FLAIR hyperintense signal abnormality elsewhere within the cerebral white matter, nonspecific but likely reflecting chronic small vessel ischemic disease given the patient's history of hypertension, diabetes and hyperlipidemia. Mild chronic small-vessel ischemic changes are also present within the pons No evidence of an intracranial mass. No extra-axial fluid collection. No midline shift. Vascular: Maintained flow voids within the proximal large arterial vessels. Skull and upper cervical spine: No focal suspicious marrow lesion. Sinuses/Orbits: Visualized orbits show no acute finding. Minimal scattered paranasal sinus mucosal thickening. IMPRESSION: Prematurely terminated and motion degraded examination. 2.4 x 1.1 x 2.0 cm acute infarct within the left corona radiata/basal ganglia. Superimposed chronic small-vessel infarcts within the bilateral corona radiata/basal ganglia. Background moderate cerebral white matter chronic small vessel ischemic disease.  Mild chronic small vessel ischemic changes are also present within the pons. Electronically Signed   By: Kellie Simmering D.O.   On: 12/30/2021 13:20    DG CHEST PORT 1 VIEW    Result Date: 12/29/2021 CLINICAL DATA:  CVA EXAM: PORTABLE CHEST 1 VIEW COMPARISON:  06/18/2018 chest radiograph FINDINGS: The cardiomediastinal silhouette is unremarkable. There is no evidence of focal airspace disease, pulmonary edema, suspicious pulmonary nodule/mass, pleural effusion, or pneumothorax. No acute bony abnormalities are identified. IMPRESSION: No active disease. Electronically Signed   By: Margarette Canada M.D.   On: 12/29/2021 16:36    EEG adult   Result Date: 12/31/2021 Lora Havens, MD     12/31/2021  2:52 PM Patient Name: Tulip Meharg MRN: [id number] Epilepsy Attending: Lora Havens Referring Physician/Provider: Garvin Fila, MD Date: 12/31/2021 Duration: 21.53 mins Patient history: 46 year old female with acute left corona radiata/basal ganglia stroke.  EEG evaluate for seizure. Level of alertness: Awake AEDs during EEG study: None Technical aspects: This EEG study was done with scalp electrodes positioned according to the 10-20 International system of electrode placement. Electrical activity was acquired at a sampling rate of _0  and reviewed with a high frequency filter of _1  and a low frequency filter of _2 . EEG data were recorded continuously and digitally stored. Description: The posterior dominant rhythm consists of 9 Hz activity of moderate voltage (25-35 uV) seen predominantly in posterior head regions, symmetric and reactive to eye opening and eye closing. Hyperventilation and photic stimulation were not performed.   IMPRESSION: This study is within normal limits. No seizures or epileptiform discharges were seen throughout the recording. Priyanka O Yadav    VAS [place] TRANSCRANIAL DOPPLER W BUBBLES   Result Date: 01/01/2022  Transcranial Doppler with Bubble Patient Name:  DEBORAH LAZCANO  Date of Exam:   12/31/2021 Medical Rec #: [id number]     Accession #:    [id number] Date of Birth: August 10, 1976    Patient Gender: F Patient Age:   81 years Exam Location:  [facility]  Procedure:      VAS [place] TRANSCRANIAL DOPPLER W BUBBLES Referring Phys: Janine Ores --------------------------------------------------------------------------------  Indications: Stroke. Comparison Study: No priors. Performing Technologist: Darlin Coco RDMS, RVT  Examination Guidelines: A complete evaluation includes B-mode imaging, spectral Doppler, color Doppler, and power Doppler as needed of all accessible portions of each vessel. Bilateral testing is considered an integral part of a complete examination. Limited examinations for reoccurring indications may be performed as noted.  Summary: No HITS at rest or during Valsalva. Negative transcranial Doppler Bubble study with no evidence of right to left intracardiac communication.  A vascular evaluation was performed. The right middle cerebral artery was studied. An IV was inserted into the patient's left forearm. Verbal informed consent was obtained.  NEGATIVE TCD Bubble study with no evidence of right to left shunt noted. *See table(s) above for TCD measurements and observations.  Diagnosing physician: Antony Contras MD Electronically signed by Antony Contras MD on 01/01/2022 at 10:54:57 AM.    Final     ECHOCARDIOGRAM COMPLETE   Result Date: 12/30/2021    ECHOCARDIOGRAM REPORT   Patient Name:   DARCY BARBARA Date of Exam: 12/30/2021 Medical Rec #:  [id number]    Height:       65.0 in Accession #:    [id number]   Weight:       153.0 lb Date of Birth:  03-03-1976   BSA:          1.765  m Patient Age:    10 years     BP:           154/100 mmHg Patient Gender: F            HR:           78 bpm. Exam Location:  Inpatient Procedure: 2D Echo Indications:    stroke  History:        Patient has prior history of Echocardiogram examinations, most                 recent 05/23/2021. Arrythmias:Atrial Fibrillation; Risk                 Factors:Diabetes.  Sonographer:    Johny Chess RDCS Referring Phys: [id number] ASHISH ARORA IMPRESSIONS  1. Left ventricular ejection fraction,  by estimation, is 70 to 75%. Left ventricular ejection fraction by PLAX is 71 %. The left ventricle has hyperdynamic function. The left ventricle has no regional wall motion abnormalities. There is mild left ventricular hypertrophy. Left ventricular diastolic parameters are consistent with Grade I diastolic dysfunction (impaired relaxation).  2. Right ventricular systolic function is normal. The right ventricular size is normal. Tricuspid regurgitation signal is inadequate for assessing PA pressure.  3. The mitral valve is grossly normal. No evidence of mitral valve regurgitation.  4. The aortic valve is tricuspid. Aortic valve regurgitation is not visualized.  5. The inferior vena cava is normal in size with <50% respiratory variability, suggesting right atrial pressure of 8 mmHg. Comparison(s): Changes from prior study are noted. 05/23/2021: LVEF 60-65%, mild LVH, normal diastolic function. FINDINGS  Left Ventricle: Left ventricular ejection fraction, by estimation, is 70 to 75%. Left ventricular ejection fraction by PLAX is 71 %. The left ventricle has hyperdynamic function. The left ventricle has no regional wall motion abnormalities. The left ventricular internal cavity size was normal in size. There is mild left ventricular hypertrophy. Left ventricular diastolic parameters are consistent with Grade I diastolic dysfunction (impaired relaxation). Indeterminate filling pressures. Right Ventricle: The right ventricular size is normal. No increase in right ventricular wall thickness. Right ventricular systolic function is normal. Tricuspid regurgitation signal is inadequate for assessing PA pressure. Left Atrium: Left atrial size was normal in size. Right Atrium: Right atrial size was normal in size. Pericardium: There is no evidence of pericardial effusion. Mitral Valve: The mitral valve is grossly normal. No evidence of mitral valve regurgitation. Tricuspid Valve: The tricuspid valve is normal in structure.  Tricuspid valve regurgitation is not demonstrated. Aortic Valve: The aortic valve is tricuspid. Aortic valve regurgitation is not visualized. Pulmonic Valve: The pulmonic valve was normal in structure. Pulmonic valve regurgitation is not visualized. Aorta: The aortic root and ascending aorta are structurally normal, with no evidence of dilitation. Venous: The inferior vena cava is normal in size with less than 50% respiratory variability, suggesting right atrial pressure of 8 mmHg. IAS/Shunts: No atrial level shunt detected by color flow Doppler.  LEFT VENTRICLE PLAX 2D LV EF:         Left            Diastology                ventricular     LV e' medial:    6.74 cm/s                ejection        LV E/e' medial:  15.4  fraction by     LV e' lateral:   7.07 cm/s                PLAX is 71      LV E/e' lateral: 14.7                %. LVIDd:         4.50 cm LVIDs:         2.70 cm LV PW:         1.20 cm LV IVS:        0.90 cm LVOT diam:     2.00 cm LV SV:         56 LV SV Index:   32 LVOT Area:     3.14 cm  RIGHT VENTRICLE             IVC RV S prime:     10.40 cm/s  IVC diam: 1.90 cm TAPSE (M-mode): 2.0 cm LEFT ATRIUM             Index        RIGHT ATRIUM           Index LA diam:        3.50 cm 1.98 cm/m   RA Area:     10.50 cm LA Vol (A2C):   47.0 ml 26.62 ml/m  RA Volume:   20.60 ml  11.67 ml/m LA Vol (A4C):   44.5 ml 25.21 ml/m LA Biplane Vol: 46.8 ml 26.51 ml/m  AORTIC VALVE LVOT Vmax:   85.20 cm/s LVOT Vmean:  58.200 cm/s LVOT VTI:    0.179 m  AORTA Ao Root diam: 2.60 cm Ao Asc diam:  2.70 cm MITRAL VALVE MV Area (PHT): 3.99 cm     SHUNTS MV Decel Time: 190 msec     Systemic VTI:  0.18 m MV E velocity: 104.00 cm/s  Systemic Diam: 2.00 cm MV A velocity: 116.00 cm/s MV E/A ratio:  0.90 Lyman Bishop MD Electronically signed by Lyman Bishop MD Signature Date/Time: 12/30/2021/12:23:51 PM    Final     ECHO TEE   Result Date: 01/01/2022    TRANSESOPHOGEAL ECHO REPORT   Patient Name:   AASIYAH AUERBACH Date of Exam: 01/01/2022 Medical Rec #:  [id number]    Height:       65.0 in Accession #:    [id number]   Weight:       153.0 lb Date of Birth:  1975-12-05   BSA:          1.765 m Patient Age:    61 years     BP:           172/80 mmHg Patient Gender: F            HR:           87 bpm. Exam Location:  Inpatient Procedure: Transesophageal Echo, Cardiac Doppler, Color Doppler and [place]ine            Contrast Bubble Study Indications:     Stroke  History:         Patient has prior history of Echocardiogram examinations, most                  recent 12/30/2021. Arrythmias:Atrial Fibrillation; Risk                  Factors:Diabetes, Hypertension, Dyslipidemia and Current  Smoker.  Sonographer:     Clayton Lefort RDCS (AE) Referring Phys:  [place] Diagnosing Phys: [place] PROCEDURE: After discussion of the risks and benefits of a TEE, an informed consent was obtained from the patient. The transesophogeal probe was passed without difficulty through the esophogus of the patient. Sedation performed by different physician. The patient was monitored while under deep sedation. Anesthestetic sedation was provided intravenously by Anesthesiology: 124.82m of Propofol, 690mof Lidocaine. Image quality was good. The patient developed no complications during the procedure. IMPRESSIONS  1. Left ventricular ejection fraction, by estimation, is 60 to 65%. The left ventricle has normal function.  2. Right ventricular systolic function is normal. The right ventricular size is normal.  3. No left atrial/left atrial appendage thrombus was detected. The LAA emptying velocity was 60 cm/s.  4. The mitral valve is normal in structure. No evidence of mitral valve regurgitation.  5. The aortic valve is normal in structure. Aortic valve regurgitation is not visualized.  6. There is mild (Grade II) plaque.  7. Agitated saline contrast bubble study was negative, with no evidence of any interatrial shunt.  Conclusion(s)/Recommendation(s): No LA/LAA thrombus identified. Negative bubble study for interatrial shunt. No intracardiac source of embolism detected on this on this transesophageal echocardiogram. FINDINGS  Left Ventricle: Left ventricular ejection fraction, by estimation, is 60 to 65%. The left ventricle has normal function. The left ventricular internal cavity size was normal in size. Right Ventricle: The right ventricular size is normal. No increase in right ventricular wall thickness. Right ventricular systolic function is normal. Left Atrium: No left atrial/left atrial appendage thrombus was detected. The LAA emptying velocity was 60 cm/s. Pericardium: There is no evidence of pericardial effusion. Mitral Valve: The mitral valve is normal in structure. No evidence of mitral valve regurgitation. Tricuspid Valve: The tricuspid valve is normal in structure. Tricuspid valve regurgitation is not demonstrated. Aortic Valve: The aortic valve is normal in structure. Aortic valve regurgitation is not visualized. Pulmonic Valve: The pulmonic valve was normal in structure. Pulmonic valve regurgitation is not visualized. Aorta: The aortic root and ascending aorta are structurally normal, with no evidence of dilitation. There is mild (Grade II) plaque. IAS/Shunts: Agitated saline contrast was given intravenously to evaluate for intracardiac shunting. Agitated saline contrast bubble study was negative, with no evidence of any interatrial shunt. MaPhineas Incheslectronically signed by MaPhineas Inchesignature Date/Time: 01/01/2022/11:57:28 AM    Final     CT HEAD CODE STROKE WO CONTRAST   Result Date: 12/29/2021 CLINICAL DATA:  Code stroke.  Left facial droop and slurred speech EXAM: CT HEAD WITHOUT CONTRAST TECHNIQUE: Contiguous axial images were obtained from the base of the skull through the vertex without intravenous contrast. RADIATION DOSE REDUCTION: This exam was performed according to the departmental  dose-optimization program which includes automated exposure control, adjustment of the mA and/or kV according to patient size and/or use of iterative reconstruction technique. COMPARISON:  12/08/2021 FINDINGS: Brain: No evidence of acute infarction, hemorrhage, hydrocephalus, extra-axial collection or mass lesion/mass effect. Chronic small vessel ischemic gliosis in the cerebral white matter. Chronic perforator infarct at the left basal ganglia. Vascular: No hyperdense vessel or unexpected calcification. Skull: Normal. Negative for fracture or focal lesion. Sinuses/Orbits: No acute finding. Other: These results were communicated to Dr. ArRory Percyt 12:36 pm on 12/29/2021 by text page via the [facility] system. ASPECTS ([facility] Cent[facility]roke Program Early CT Score) - Ganglionic level infarction (caudate, lentiform nuclei, internal capsule, insula, M1-M3 cortex): 7 -  Supraganglionic infarction (M4-M6 cortex): 3 Total score (0-10 with 10 being normal): 10 IMPRESSION: 1. No acute finding. 2. Premature chronic small vessel disease. Electronically Signed   By: Jorje Guild M.D.   On: 12/29/2021 12:36    CT ANGIO HEAD NECK W WO CM (CODE STROKE)   Result Date: 12/29/2021 CLINICAL DATA:  Provided history: Neuro deficit, acute, stroke suspected. Left-sided weakness. Slurred speech. EXAM: CT ANGIOGRAPHY HEAD AND NECK TECHNIQUE: Multidetector CT imaging of the head and neck was performed using the standard protocol during bolus administration of intravenous contrast. Multiplanar CT image reconstructions and MIPs were obtained to evaluate the vascular anatomy. Carotid stenosis measurements (when applicable) are obtained utilizing NASCET criteria, using the distal internal carotid diameter as the denominator. RADIATION DOSE REDUCTION: This exam was performed according to the departmental dose-optimization program which includes automated exposure control, adjustment of the mA and/or kV according to patient size and/or use of  iterative reconstruction technique. CONTRAST:  153m OMNIPAQUE IOHEXOL 350 MG/ML SOLN COMPARISON:  Noncontrast head CT performed earlier today 12/29/2021. Brain MRI 05/23/2021. CT angiogram head/neck 05/23/2021. FINDINGS: CTA NECK FINDINGS Aortic arch: The visualized aortic arch is normal in caliber. Streak and beam hardening artifact arising from a dense left-sided contrast bolus partially obscures the innominate artery. Within this limitation, there is no appreciable hemodynamically significant innominate or proximal subclavian artery stenosis. Right carotid system: CCA and ICA patent within the neck without stenosis or significant atherosclerotic disease. Left carotid system: CCA and ICA patent within the neck without stenosis or significant atherosclerotic disease. Vertebral arteries: Vertebral arteries codominant and patent within the neck without stenosis or significant atherosclerotic disease. Skeleton: Cervical spondylosis. No acute bony abnormality or aggressive osseous lesion. Other neck: 1.8 cm right thyroid lobe nodule. Upper chest: No consolidation within the imaged lung apices. Review of the MIP images confirms the above findings CTA HEAD FINDINGS Anterior circulation: The intracranial internal carotid arteries are patent. The M1 middle cerebral arteries are patent. No no M2 proximal branch occlusion is identified. Atherosclerotic irregularity of the M2 and more distal MCA vessels, bilaterally. Most notably, there is an apparent moderate/severe focal stenosis within a superior division mid M2 left MCA vessel (series 12, image 25). The anterior cerebral arteries are patent. No intracranial aneurysm is identified. Posterior circulation: The intracranial vertebral arteries are patent. The basilar artery is patent. The posterior cerebral arteries are patent. Posterior communicating arteries are diminutive or absent bilaterally. Venous sinuses: Within the limitations of contrast timing, no convincing  thrombus. Anatomic variants: As described. Review of the MIP images confirms the above findings No emergent large vessel occlusion identified. These results were communicated to Dr. ARory Percyat 12:55 pmon 4/22/2023by text page via the [facility] system. IMPRESSION: CTA neck: 1. The common carotid, internal carotid and vertebral arteries are patent within the neck without stenosis. 2. 1.8 cm right thyroid lobe nodule. A non-emergent thyroid ultrasound is recommended for further evaluation. CTA head: 1. No intracranial large vessel occlusion is identified. 2. Atherosclerotic irregularity of the M2 and more distal MCA vessels, bilaterally. Most notably, there is an apparent moderate/severe focal stenosis within a superior division mid M2 left MCA vessel. Electronically Signed   By: KKellie SimmeringD.O.   On: 12/29/2021 12:59     Assessment & Plan:   Problem List Items Addressed This Visit   None Check urine for microalbumin No follow-ups on file.   PAsencion Noble MD

## 2022-08-15 ENCOUNTER — Other Ambulatory Visit: Payer: Self-pay | Admitting: Critical Care Medicine

## 2022-08-16 NOTE — Telephone Encounter (Signed)
Requested Prescriptions  Pending Prescriptions Disp Refills   metoprolol succinate (TOPROL-XL) 25 MG 24 hr tablet [Pharmacy Med Name: METOPROLOL ER SUCCINATE 25MG  TABS] 270 tablet 0    Sig: TAKE 3 TABLETS(75 MG) BY MOUTH DAILY     Cardiovascular:  Beta Blockers Passed - 08/15/2022 11:24 PM      Passed - Last BP in normal range    BP Readings from Last 1 Encounters:  07/31/22 118/73         Passed - Last Heart Rate in normal range    Pulse Readings from Last 1 Encounters:  07/31/22 65         Passed - Valid encounter within last 6 months    Recent Outpatient Visits           4 months ago Type 2 diabetes mellitus without complication, unspecified whether long term insulin use (HCC)   [facility], MD   5 months ago Cerebrovascular accident (CVA) due to thrombosis of precerebral artery [facility])   [facility], MD   4 years ago Uncontrolled type 2 diabetes mellitus with hyperglycemia [facility])   [facility] at [place], [place]   4 years ago Uncontrolled type 2 diabetes mellitus without complication [facility])   [facility] at [facility], [place], [place]

## 2022-08-19 ENCOUNTER — Telehealth: Payer: Self-pay | Admitting: Critical Care Medicine

## 2022-08-19 NOTE — Telephone Encounter (Signed)
Copied from CRM [id number]. Topic: General - Inquiry >> Aug 19, 2022 10:32 AM [place] wrote: Reason for CRM: Pt stated she needs home health aid services. Pt stated she needs assistance with her appointments to help her take a bath and help her around.  Please advise

## 2022-08-19 NOTE — Telephone Encounter (Signed)
Is she able to receive services ?

## 2022-08-19 NOTE — Telephone Encounter (Signed)
In order to do this I need to see the patient. Not seen since August  Can be a video/phone visit

## 2022-08-21 ENCOUNTER — Other Ambulatory Visit (HOSPITAL_COMMUNITY): Payer: Self-pay

## 2022-08-21 ENCOUNTER — Telehealth: Payer: BC Managed Care – PPO

## 2022-08-21 ENCOUNTER — Telehealth: Payer: BC Managed Care – PPO | Admitting: Physician Assistant

## 2022-08-21 DIAGNOSIS — J309 Allergic rhinitis, unspecified: Secondary | ICD-10-CM | POA: Diagnosis not present

## 2022-08-21 DIAGNOSIS — H1013 Acute atopic conjunctivitis, bilateral: Secondary | ICD-10-CM

## 2022-08-21 MED ORDER — AZELASTINE HCL 0.05 % OP SOLN
1.0000 [drp] | Freq: Two times a day (BID) | OPHTHALMIC | 1 refills | Status: DC
  Filled 2022-08-21: qty 6, fill #0

## 2022-08-21 NOTE — Telephone Encounter (Signed)
I called her and explained the difference between home health care and PCS.  She is interested in [facility] and said that she now has Medicaid.  She could not find her Medicaid card and I asked her to call back with the number so we can confirm the insurance. Medicaid is required for PCS.  She said she needs assistance with bathing, homemaking and helping her arrange rides to her appointments.  I scheduled her with Dr Delford Field - 09/10/2022.

## 2022-08-21 NOTE — Patient Instructions (Signed)
Weber Cooks, thank you for joining [name], PA-C for today's virtual visit.  While this provider is not your primary care provider (PCP), if your PCP is located in our provider database this encounter information will be shared with them immediately following your visit.   [facility] account gives you access to today's visit and all your visits, tests, and labs performed at [facility] " click here if you don't have a [place] [facility] [place] account or go to [email or web address]  Consent: (Patient) Stacey Lucas provided verbal consent for this virtual visit at the beginning of the encounter.  Current Medications:  Current Outpatient Medications:    ACCU-CHEK GUIDE test strip, USE UP TO FOUR TIMES DAILY AS DIRECTED, Disp: , Rfl:    Accu-Chek Softclix Lancets lancets, SMARTSIG:Topical 1-4 Times Daily, Disp: , Rfl:    amLODipine (NORVASC) 10 MG tablet, Take 1 tablet (10 mg total) by mouth daily., Disp: 90 tablet, Rfl: 1   aspirin EC 81 MG tablet, Take 1 tablet (81 mg total) by mouth daily. Swallow whole., Disp: 100 tablet, Rfl: 2   blood glucose meter kit and supplies KIT, Dispense based on patient and insurance preference. Use up to four times daily as directed., Disp: 1 each, Rfl: 0   buPROPion (WELLBUTRIN XL) 150 MG 24 hr tablet, TAKE 1 TABLET(150 MG) BY MOUTH DAILY, Disp: 30 tablet, Rfl: 2   citalopram (CELEXA) 10 MG tablet, Take 1 tablet (10 mg total) by mouth daily., Disp: 60 tablet, Rfl: 3   clopidogrel (PLAVIX) 75 MG tablet, TAKE 1 TABLET(75 MG) BY MOUTH DAILY, Disp: 90 tablet, Rfl: 1   diclofenac Sodium (VOLTAREN) 1 % GEL, Apply 4 g topically daily as needed (For knee pain)., Disp: 400 g, Rfl: 1   dicyclomine (BENTYL) 10 MG capsule, Take 1 capsule (10 mg total) by mouth 4 (four) times daily -  before meals and at bedtime. (Patient not taking: Reported on 04/18/2022), Disp: 40 capsule, Rfl: 0   glimepiride (AMARYL) 2 MG tablet, Take 1 tablet (2 mg total)  by mouth daily with breakfast., Disp: 60 tablet, Rfl: 2   Insulin Glargine (BASAGLAR KWIKPEN) 100 UNIT/ML, Inject 5 Units into the skin daily., Disp: 15 mL, Rfl: 2   Insulin Pen Needle 32G X 4 MM MISC, Use to inject insulin at bed time, Disp: 100 each, Rfl: 0   irbesartan (AVAPRO) 75 MG tablet, Take 1 tablet (75 mg total) by mouth daily., Disp: 60 tablet, Rfl: 2   meloxicam (MOBIC) 15 MG tablet, TAKE 1 TABLET(15 MG) BY MOUTH DAILY, Disp: 30 tablet, Rfl: 0   metoprolol succinate (TOPROL-XL) 25 MG 24 hr tablet, TAKE 3 TABLETS(75 MG) BY MOUTH DAILY, Disp: 270 tablet, Rfl: 0   ofloxacin (OCUFLOX) 0.3 % ophthalmic solution, Place 1 drop into the left eye 4 (four) times daily., Disp: 5 mL, Rfl: 0   olopatadine (PATADAY) 0.1 % ophthalmic solution, Place 1 drop into both eyes 2 (two) times daily., Disp: 5 mL, Rfl: 12   rosuvastatin (CRESTOR) 20 MG tablet, Take 1 tablet (20 mg total) by mouth at bedtime., Disp: 60 tablet, Rfl: 2   Medications ordered in this encounter:  No orders of the defined types were placed in this encounter.    *If you need refills on other medications prior to your next appointment, please contact your pharmacy*  Follow-Up: Call back or seek an in-person evaluation if the symptoms worsen or if the condition fails to improve as anticipated.  [facility] [phone]  Other Instructions Please keep hands clean and dry. Avoid rubbing the eyes. Start OTC Allegra once daily. Take the newly prescribed drops as directed. It is very important you follow-up with your PCP regarding this.   If you have been instructed to have an in-person evaluation today at a local Urgent Care facility, please use the link below. It will take you to a list of all of our available [facility], including address, phone number and hours of operation. Please do not delay care.  [facility]  If you or a family member do not have a primary care provider, use the  link below to schedule a visit and establish care. When you choose a [facility] primary care physician or advanced practice provider, you gain a long-term partner in health. Find a Primary Care Provider  Learn more about [facility]'s in-office and virtual care options: [facility]

## 2022-08-21 NOTE — Progress Notes (Signed)
Virtual Visit Consent   Stacey Lucas, you are scheduled for a virtual visit with a [facility] provider today. Just as with appointments in the office, your consent must be obtained to participate. Your consent will be active for this visit and any virtual visit you may have with one of our providers in the next 365 days. If you have a MyChart account, a copy of this consent can be sent to you electronically.  As this is a virtual visit, video technology does not allow for your provider to perform a traditional examination. This may limit your provider's ability to fully assess your condition. If your provider identifies any concerns that need to be evaluated in person or the need to arrange testing (such as labs, EKG, etc.), we will make arrangements to do so. Although advances in technology are sophisticated, we cannot ensure that it will always work on either your end or our end. If the connection with a video visit is poor, the visit may have to be switched to a telephone visit. With either a video or telephone visit, we are not always able to ensure that we have a secure connection.  By engaging in this virtual visit, you consent to the provision of healthcare and authorize for your insurance to be billed (if applicable) for the services provided during this visit. Depending on your insurance coverage, you may receive a charge related to this service.  I need to obtain your verbal consent now. Are you willing to proceed with your visit today? Alyissa Whidbee has provided verbal consent on 08/21/2022 for a virtual visit (video or telephone). Stacey Lucas, [place]  Date: 08/21/2022 3:55 PM  Virtual Visit via Video Note   I, Stacey Lucas, connected with  Mickayla Trouten  ([id number], 04-26-1976) on 08/21/22 at  3:30 PM EST by a video-enabled telemedicine application and verified that I am speaking with the correct person using two identifiers.  Location: Patient: Virtual Visit Location  Patient: Home Provider: Virtual Visit Location Provider: Home Office   I discussed the limitations of evaluation and management by telemedicine and the availability of in person appointments. The patient expressed understanding and agreed to proceed.    History of Present Illness: Stacey Lucas is a 46 y.o. who identifies as a female who was assigned female at birth, and is being seen today for recurrence of bilateral eye itching and watering over the past few days, associated with some mild nasal congestion. This has been a recurring issue for her. Recently was flared up significantly that her PCP office had to start her on antibiotic drops for secondary bacterial conjunctivitis. Is not currently still on any eye drops.  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 12/29/2021   Atrial fibrillation ([place]) 07/04/2021   H/O: stroke 07/04/2021   Cerebral thrombosis with cerebral infarction 05/24/2021   Osteoarthritis of right knee 05/24/2021   Type 2 diabetes mellitus without complication ([place]) 93/79/0240   Essential hypertension 05/23/2021   Hyperlipidemia 05/23/2021   Thyroid nodule 05/23/2021   MDD (major depressive disorder), recurrent severe, without psychosis ([place]) 04/27/2018    Allergies:  Allergies  Allergen Reactions   Gabapentin     Weakness/impairs balance   Medications:  Current Outpatient Medications:    azelastine (OPTIVAR) 0.05 % ophthalmic solution, Place 1  drop into both eyes 2 (two) times daily., Disp: 6 mL, Rfl: 1   ACCU-CHEK GUIDE test strip, USE UP TO FOUR TIMES DAILY AS DIRECTED, Disp: , Rfl:    Accu-Chek Softclix Lancets lancets, SMARTSIG:Topical 1-4 Times Daily, Disp: , Rfl:    amLODipine (NORVASC) 10 MG tablet, Take 1 tablet (10 mg total) by mouth daily.,  Disp: 90 tablet, Rfl: 1   aspirin EC 81 MG tablet, Take 1 tablet (81 mg total) by mouth daily. Swallow whole., Disp: 100 tablet, Rfl: 2   blood glucose meter kit and supplies KIT, Dispense based on patient and insurance preference. Use up to four times daily as directed., Disp: 1 each, Rfl: 0   buPROPion (WELLBUTRIN XL) 150 MG 24 hr tablet, TAKE 1 TABLET(150 MG) BY MOUTH DAILY, Disp: 30 tablet, Rfl: 2   citalopram (CELEXA) 10 MG tablet, Take 1 tablet (10 mg total) by mouth daily., Disp: 60 tablet, Rfl: 3   clopidogrel (PLAVIX) 75 MG tablet, TAKE 1 TABLET(75 MG) BY MOUTH DAILY, Disp: 90 tablet, Rfl: 1   diclofenac Sodium (VOLTAREN) 1 % GEL, Apply 4 g topically daily as needed (For knee pain)., Disp: 400 g, Rfl: 1   glimepiride (AMARYL) 2 MG tablet, Take 1 tablet (2 mg total) by mouth daily with breakfast., Disp: 60 tablet, Rfl: 2   Insulin Glargine (BASAGLAR KWIKPEN) 100 UNIT/ML, Inject 5 Units into the skin daily., Disp: 15 mL, Rfl: 2   Insulin Pen Needle 32G X 4 MM MISC, Use to inject insulin at bed time, Disp: 100 each, Rfl: 0   irbesartan (AVAPRO) 75 MG tablet, Take 1 tablet (75 mg total) by mouth daily., Disp: 60 tablet, Rfl: 2   meloxicam (MOBIC) 15 MG tablet, TAKE 1 TABLET(15 MG) BY MOUTH DAILY, Disp: 30 tablet, Rfl: 0   metoprolol succinate (TOPROL-XL) 25 MG 24 hr tablet, TAKE 3 TABLETS(75 MG) BY MOUTH DAILY, Disp: 270 tablet, Rfl: 0   rosuvastatin (CRESTOR) 20 MG tablet, Take 1 tablet (20 mg total) by mouth at bedtime., Disp: 60 tablet, Rfl: 2  Observations/Objective: Patient is well-developed, well-nourished in no acute distress.  Resting comfortably at home.  Head is normocephalic, atraumatic.  No labored breathing. Speech is clear and coherent with logical content.  Patient is alert and oriented at baseline.     Assessment and Plan: 1. Allergic conjunctivitis and rhinitis, bilateral - azelastine (OPTIVAR) 0.05 % ophthalmic solution; Place 1 drop into both eyes 2 (two) times  daily.  Dispense: 6 mL; Refill: 1  Start optivar bilaterally daily for now. Start OTC allegra once daily. Needs follow-up with PCP giving this ongoing issue and possible referral to Allergy and Ophthalmology.   Follow Up Instructions: I discussed the assessment and treatment plan with the patient. The patient was provided an opportunity to ask questions and all were answered. The patient agreed with the plan and demonstrated an understanding of the instructions.  A copy of instructions were sent to the patient via MyChart unless otherwise noted below.   The patient was advised to call back or seek an in-person evaluation if the symptoms worsen or if the condition fails to improve as anticipated.  Time:  I spent 10 minutes with the patient via telehealth technology discussing the above problems/concerns.    Stacey Rio, PA-C

## 2022-08-23 NOTE — Telephone Encounter (Signed)
noted 

## 2022-09-07 NOTE — Progress Notes (Deleted)
New Patient Office Visit  Subjective    Patient ID: Stacey Lucas, female    DOB: 08-27-1976  Age: 46 y.o. MRN: [id number]  CC:  No chief complaint on file.   HPI Stacey Lucas presents to establish care 6/27 This is a 46 year old female former cocaine user with severe hypertension was admitted for acute stroke in April 2023 she was eventually discharged from acute inpatient rehab May 11.  Below are both discharge summaries.  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  She now is living at home and has an aide which is a friend of hers who is a Chief Executive Officer.  Patient is requesting personal care services and would like a Dexcom meter.  History of type 2 diabetes she was sent out without any insulin but given Amaryl only.  The concern was she might overdose and be hypoglycemic with her recent stroke.  Patient currently is taking the Amaryl only.  On arrival blood sugar was 230.  Blood pressure 113/74 The patient struggles with the diet.  She eats [place] sausage and grits and eggs in the morning to skips lunch and/or-is fast food in the evening.  Patient had a urinary tract infection during the last hospitalization but has not no symptoms now.  She has no other real complaints at this time except for numbness in the right foot and pain in the right knee.  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  She is requesting personal care services to help with bathing and ADLs.  She has difficulty accessing healthy foods.  There was a delay in getting her appointment in our clinic and she did miss 1 appointment due to communication breakdown.  Luckily rehab did agree to bridge her medications at this visit. 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 is a 46 y.o. 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 She had near resolution of original symptoms (left side weakness) and on the way to MRI developed right side weakness, dysarthria, and a facial droop. MRI was aborted and TNKase was given in ED. MRI today shows an acute infarct of the left corona radiata/basal ganglia.  DAPT therapy started 24 hours post TNK.  DVT prophylaxis with Lovenox. TCD and TEE negative.     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 Small vessel disease. Atrophy. ASPECTS 10.    CTA head & neck Atherosclerotic irregularity of the M2 and more distal MCA vessels, bilaterally. Most notably, there is an apparent moderate/severe focal stenosis within a superior division mid M2 left MCA vessel. MRI  2.4 x  1.1 x 2.0 cm acute infarct within the left corona radiata/basal ganglia. 2D Echo EF 70-75% with grade I diastolic dysfunction TCD and TEE negative LDL 117 HgbA1c 7.1 VTE prophylaxis - lovenox  No antithrombotic prior to admission, now on aspirin 81 mg daily and clopidogrel 75 mg daily.  For 3 weeks and then aspirin alone. Therapy recommendations:  CIR Disposition:  pending   Hypertension Home meds:  valsartan, toprol  XL Stable Permissive hypertension (OK if < 220/120) but gradually normalize in 5-7 days Long-term BP goal normotensive   Hyperlipidemia Home meds:  rosuvastatin 74m LDL 117, goal < 70 Increase to rosuvastatin 224mContinue statin at discharge   Diabetes type II Controlled Home meds:  Insulin HgbA1c 7.1, goal < 7.0 CBGs SSI- increased with meal coverage   Other Stroke Risk Factors Hx stroke/TIA 05/2021-right caudate infarct secondary to small vessel disease Cigarette smoker, advised to stop smoking Cocaine use Migraines Currently not on any medications   Other Active Problems UTI on 3d Rocephin, Ucx greater than 100,000 colonies of group B strep sensitivity pending GERD Anxiety/depression Incidental thyroid nodule: follow up with PCP Noted during previous hospital stay  Rehab: Admit date: 01/02/2022 Discharge date: 01/17/2022   Discharge Diagnoses:  Principal Problem:   CVA (cerebral vascular accident) ([place] Problems:   Type 2 diabetes mellitus without complication ([facility]  Essential hypertension   Hyperlipidemia   Thyroid nodule   Anemia   Acute-on-chronic kidney injury ([facility] Brief HPI:   Stacey Lucas a [age].o. female with history of uncontrolled T2DM with neuropathy/RLE weakness, CVA 09/22, thyroid nodule, depression, ongoing cocaine abuse who was admitted on 12/29/2021 with left-sided weakness, left facial weakness and slurred speech which was transient.  She then developed near flaccidity of R UE, RLE greater than LLE drift, slurred speech and right facial droop while in ED.  UDS was positive for cocaine.  CTA head neck was negative for LVO.  Blood pressure noted to be elevated and was treated with IV labetalol and an Cleviprex.  She received TNK and follow-up MRI brain showed acute infarct in left corona radiata, basal ganglia and chronic small vessel disease.  2D echo showed EF of 70 to 75% with mild LVH.     Follow-up TEE was negative for intra-atrial  shunt, thrombus and negative bubble study.  EEG was negative for seizures.  Hospital course was significant for acute on chronic renal failure with rise in serum creatinine to 1.66.  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 SeLeonie Manecommended DAPT x3 weeks followed by aspirin alone.  She continues to be limited by right-sided weakness with left inattention, right lean as well as right knee instability with standing attempts.  CIR was recommended due to functional decline.     Hospital Course: Stacey Lucas admitted to rehab 01/02/2022 for inpatient therapies to consist of PT, ST and OT at least three hours five days a week. Past admission physiatrist, therapy team and rehab RN have worked together to provide customized collaborative inpatient rehab.  Her blood pressures were monitored on TID basis and have been well controlled.  Serial check of electrolytes showed acute on chronic chronic renal failure therefore Avapro was just decreased to 75 amlodipine was added and titrated to 10 mg/day.  She has had recurrent rise in his serum creatinine was advised to increase p.o. fluids recommendations to have repeat labs monitoring of renal status.  Has been stable and she has shown good  participation with therapy.  She was started on bupropion to help with anxiety as well as quit smoking.  Gabapentin for $00 mg twice daily was added to assist with cocaine cessation however patient was unable to tolerate this due to extreme fatigue/balance issues therefore this was discontinued   Follow-up CBC showed H&H to be stable without signs of bleeding.  She was maintained on DAPT during her stay after 3 days.  Her diabetes has been monitored with ac/hs CBG checks and SSI was use prn for tighter BS control.  Low-dose Amaryl was added due to CKD in addition to insulin glargine 5 units at bedtime.  Her p.o. intake has been good and blood sugars are relatively controlled but will need further adjustment on  outpatient basis. Patient and fiancee have been educated on effects of tobacco, marijuana/cocaine on brain and it's stroke risk as well as importance of medical/medication compliance. She has been set up with Dr. Wynetta Emery for primary care follow up.  She has made good gains during her rehab stay and requires intermittent supervision for mobility.  She will continue to receive follow-up outpatient PT and OT at [facility] neuro rehab after discharge.     Rehab course: During patient's stay in rehab weekly team conferences were held to monitor patient's progress, set goals and discuss barriers to discharge. At admission, patient required min to mod assist with ADL tasks and mod assist with mobility. She exhibited mild cognitive impairments with acute mild dysarthria and deficits.  She  has had improvement in activity tolerance, balance, postural control as well as ability to compensate for deficits.  She has had improvement in functional use RUE  and RLE as well as improvement in awareness. She is able to complete ADL tasks with CGA to min assist. She is modified independent for transfers and to ambulate 150' with RW.  She requires CGA to climb 12 stairs. Speech is intelligible and she is able to complete cognitive tasks with repetition as well as extra time for processing. Family education was completed.    Discharge disposition: 01-Home or Self Care  Below is the last rehab visit outpatient which occurred May 25 1. Functional deficits secondary to left corona radiata infarct. Cocaine related vasculopathy             -Continue outpatient PT/OT            -Order placed for shower bench             -Home health aid requested, order placed but she does not qualify at this time due to lack of skilled home need     2. HTN well-controlled today -continue Avapro and metoprolol -Metoprolol was refilled, follow-up with primary care physician     3. T2DM -Continue current medications, glucometer was ordered      4.  Polysubstance use with history of tobacco abuse , cocaine use -Continue buproprion, congratulated her on not smoking tobacco or using other illicit substances   5.  Right knee pain -Suspect patellofemoral syndrome, Pt to work on this with PT,  Can use knee brace and continue Voltaren gel   Below is a phone note from rehab 6/22 phone note: Ms Garcilazo's fiance showed up in the office stating she does not have appt with PCP until 03/05/22 and they did not get her amlodipine refill and the plavix. I have reviewed chart and the refill on the plavix was sent in but only 4 tablets (she had 2 separate rx from [facility]  [place]). I have refilled the amlodipine 30 d x1 and the plavix 30 d x1 with no further refills from our office. She no showed  her scheduled appt with PCP 02/19/22 and it has been rescheduled to 03/05/22 with Stacey Noble MD. She has not seen Dr Leonie Man and no appt scheduled. I reviewed the referral and it looks like the [place] office has been unable to reach the patient to set the appt. I reprinted the AVS from the inpatient discharge and highlighted [facility] address and phone # for them to call when he gets home!!! (she was on the phone speaker while he was here). I also highlighted the PCP appt and reinforced they cannot miss that appointment for we can not refill her meds again. They both verbalized understanding.   There is a history of smoking patient is smoking less at this time she was on bupropion for this also given Celexa for depression she states her depression is at baselineAnd is stable  8/10 This patient is seen in return follow-up from stroke symptoms she is yet to see neurology.  She has run out of her clopidogrel and is not taking it currently.  The plan was to stop after 3 months however when she did stop the medicine she noted increased weakness in the right arm and leg.  She is still using the walker.  She is completed her rehab.  She was followed by physical  medicine. Patient states she gets cold in the right arm and right lower extremity at times.  On arrival blood pressure is well-controlled 118/70.  She maintains Iber Sartain, amlodipine, and metoprolol.  Patient also maintains low-dose aspirin and Crestor.  Patient does maintain low-dose insulin glargine 5 units daily however she has not picked up a recent refill of this.  For her diabetes she maintains Amaryl alone.  The patient does not monitor her blood sugars on a regular basis.  On arrival blood sugar is 259 The patient receive the Cologuard kit but could not process that she is having too many loose stools  09/10/22 Essential hypertension       Blood pressure at goal no change in medications refills issued        Relevant Medications    amLODipine (NORVASC) 10 MG tablet    aspirin EC 81 MG tablet    irbesartan (AVAPRO) 75 MG tablet    metoprolol succinate (TOPROL-XL) 25 MG 24 hr tablet    rosuvastatin (CRESTOR) 20 MG tablet    Cerebral thrombosis with cerebral infarction    Relevant Medications    amLODipine (NORVASC) 10 MG tablet    aspirin EC 81 MG tablet    irbesartan (AVAPRO) 75 MG tablet    metoprolol succinate (TOPROL-XL) 25 MG 24 hr tablet    rosuvastatin (CRESTOR) 20 MG tablet    Other Relevant Orders    Ambulatory referral to Neurology    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      I have resumed the patient's clopidogrel she is to take this daily along with aspirin until we can get her into neurology for further assessments   Patient to maintain low-dose Crestor   Referral back to neurology was made            Relevant Medications    amLODipine (NORVASC) 10 MG tablet    aspirin EC 81 MG tablet    irbesartan (AVAPRO) 75 MG tablet    metoprolol succinate (  TOPROL-XL) 25 MG 24 hr tablet    rosuvastatin (CRESTOR) 20 MG tablet        Endocrine    Type 2 diabetes mellitus without complication  (HCC) - Primary      Poorly controlled diabetes referral back to clinical pharmacy made continue insulin and Amaryl        Relevant Medications    aspirin EC 81 MG tablet    irbesartan (AVAPRO) 75 MG tablet    rosuvastatin (CRESTOR) 20 MG tablet    glimepiride (AMARYL) 2 MG tablet    Insulin Glargine (BASAGLAR KWIKPEN) 100 UNIT/ML    Other Relevant Orders    POCT glucose (manual entry) (Completed)    Urine microalbumin-creatinine with uACR        Genitourinary    Acute-on-chronic kidney injury ([place])      Renal function stable we will monitor            Other    Hyperlipidemia      Continue Crestor        Relevant Medications    amLODipine (NORVASC) 10 MG tablet    aspirin EC 81 MG tablet    irbesartan (AVAPRO) 75 MG tablet    metoprolol succinate (TOPROL-XL) 25 MG 24 hr tablet    rosuvastatin (CRESTOR) 20 MG tablet    Other Visit Diagnoses       Diarrhea, unspecified type       Fobt, pap, A1c uab flu  Outpatient Encounter Medications as of 09/10/2022  Medication Sig   ACCU-CHEK GUIDE test strip USE UP TO FOUR TIMES DAILY AS DIRECTED   Accu-Chek Softclix Lancets lancets SMARTSIG:Topical 1-4 Times Daily   amLODipine (NORVASC) 10 MG tablet Take 1 tablet (10 mg total) by mouth daily.   aspirin EC 81 MG tablet Take 1 tablet (81 mg total) by mouth daily. Swallow whole.   azelastine (OPTIVAR) 0.05 % ophthalmic solution Place 1 drop into both eyes 2 (two) times daily.   blood glucose meter kit and supplies KIT Dispense based on patient and insurance preference. Use up to four times daily as directed.   buPROPion (WELLBUTRIN XL) 150 MG 24 hr tablet TAKE 1 TABLET(150 MG) BY MOUTH DAILY   citalopram (CELEXA) 10 MG tablet Take 1 tablet (10 mg total) by mouth daily.   clopidogrel (PLAVIX) 75 MG tablet TAKE 1 TABLET(75 MG) BY MOUTH DAILY   diclofenac Sodium (VOLTAREN) 1 % GEL Apply 4 g topically daily as needed (For knee pain).   glimepiride (AMARYL) 2 MG tablet Take 1 tablet (2 mg  total) by mouth daily with breakfast.   Insulin Glargine (BASAGLAR KWIKPEN) 100 UNIT/ML Inject 5 Units into the skin daily.   Insulin Pen Needle 32G X 4 MM MISC Use to inject insulin at bed time   irbesartan (AVAPRO) 75 MG tablet Take 1 tablet (75 mg total) by mouth daily.   meloxicam (MOBIC) 15 MG tablet TAKE 1 TABLET(15 MG) BY MOUTH DAILY   metoprolol succinate (TOPROL-XL) 25 MG 24 hr tablet TAKE 3 TABLETS(75 MG) BY MOUTH DAILY   rosuvastatin (CRESTOR) 20 MG tablet Take 1 tablet (20 mg total) by mouth at bedtime.   No facility-administered encounter medications on file as of 09/10/2022.    Past Medical History:  Diagnosis Date   Anxiety and depression    Cocaine abuse ([place])    Depression with suicidal ideation    [place] admission 04/2018   Diabetes mellitus without complication (HCC)    Type II   GERD (  gastroesophageal reflux disease)    Hyperlipidemia    Hypertension    Impingement syndrome of right shoulder    Migraine headache    Osteoarthritis of right knee 05/24/2021   Pilonidal abscess 05/2013   Pyelonephritis    Right thyroid nodule    Sciatica     Past Surgical History:  Procedure Laterality Date   BUBBLE STUDY  01/01/2022   Procedure: BUBBLE STUDY;  Surgeon: Janina Mayo, MD;  Location: [facility];  Service: Cardiovascular;;   TEE WITHOUT CARDIOVERSION N/A 01/01/2022   Procedure: TRANSESOPHAGEAL ECHOCARDIOGRAM (TEE);  Surgeon: Janina Mayo, MD;  Location[facility] ENDOSCOPY;  Service: Cardiovascular;  Laterality: N/A;   tubal      Family History  Problem Relation Age of Onset   Diabetes Mother    Hypertension Mother    Arthritis Mother    Diabetes Father    Hypertension Father     Social History   Socioeconomic History   Marital status: Single    Spouse name: Not on file   Number of children: 3   Years of education: Not on file   Highest education level: Not on file  Occupational History   Not on file  Tobacco Use   Smoking status: Former    Packs/day:  1.00    Types: Cigarettes    Quit date: 01/16/2022    Years since quitting: 0.6   Smokeless tobacco: Never  Vaping Use   Vaping Use: Never used  Substance and Sexual Activity   Alcohol use: Yes    Comment: occasionally   Drug use: No   Sexual activity: Yes    Birth control/protection: None  Other Topics Concern   Not on file  Social History Narrative   Lives with children   "lots of caffeine"   Social Determinants of Health   Financial Resource Strain: Not on file  Food Insecurity: Not on file  Transportation Needs: Not on file  Physical Activity: Not on file  Stress: Not on file  Social Connections: Not on file  Intimate Partner Violence: Not on file    Review of Systems  Constitutional:  Negative for chills, diaphoresis, fever, malaise/fatigue and weight loss.  HENT:  Negative for congestion, hearing loss, nosebleeds, sore throat and tinnitus.   Eyes:  Negative for blurred vision, photophobia and redness.  Respiratory:  Negative for cough, hemoptysis, sputum production, shortness of breath, wheezing and stridor.   Cardiovascular:  Negative for chest pain, palpitations, orthopnea, claudication, leg swelling and PND.  Gastrointestinal:  Negative for abdominal pain, blood in stool, constipation, diarrhea, heartburn, nausea and vomiting.  Genitourinary:  Negative for dysuria, flank pain, frequency, hematuria and urgency.  Musculoskeletal:  Positive for joint pain. Negative for back pain, falls, myalgias and neck pain.       Gait difficulty  Skin:  Negative for itching and rash.  Neurological:  Positive for tingling, sensory change, focal weakness and weakness. Negative for dizziness, tremors, speech change, seizures, loss of consciousness and headaches.  Endo/Heme/Allergies:  Negative for environmental allergies and polydipsia. Does not bruise/bleed easily.  Psychiatric/Behavioral: Negative.  Negative for depression, memory loss, substance abuse and suicidal ideas. The patient  is not nervous/anxious and does not have insomnia.         Objective    LMP 10/11/2019   Physical Exam Vitals reviewed.  Constitutional:      Appearance: Normal appearance. She is well-developed and normal weight. She is not diaphoretic.  HENT:     Head: Normocephalic and  atraumatic.     Nose: No nasal deformity, septal deviation, mucosal edema or rhinorrhea.     Right Sinus: No maxillary sinus tenderness or frontal sinus tenderness.     Left Sinus: No maxillary sinus tenderness or frontal sinus tenderness.     Mouth/Throat:     Pharynx: No oropharyngeal exudate.  Eyes:     General: No scleral icterus.    Conjunctiva/sclera: Conjunctivae normal.     Pupils: Pupils are equal, round, and reactive to light.  Neck:     Thyroid: No thyromegaly.     Vascular: No carotid bruit or JVD.     Trachea: Trachea normal. No tracheal tenderness or tracheal deviation.  Cardiovascular:     Rate and Rhythm: Normal rate and regular rhythm.     Chest Wall: PMI is not displaced.     Pulses: Normal pulses. No decreased pulses.     Heart sounds: Normal heart sounds, S1 normal and S2 normal. Heart sounds not distant. No murmur heard.    No systolic murmur is present.     No diastolic murmur is present.     No friction rub. No gallop. No S3 or S4 sounds.  Pulmonary:     Effort: No tachypnea, accessory muscle usage or respiratory distress.     Breath sounds: No stridor. No decreased breath sounds, wheezing, rhonchi or rales.  Chest:     Chest wall: No tenderness.  Abdominal:     General: Bowel sounds are normal. There is no distension.     Palpations: Abdomen is soft. Abdomen is not rigid.     Tenderness: There is no abdominal tenderness. There is no guarding or rebound.  Musculoskeletal:        General: Normal range of motion.     Cervical back: Normal range of motion and neck supple. No edema, erythema or rigidity. No muscular tenderness. Normal range of motion.     Comments: Foot exam normal   Lymphadenopathy:     Head:     Right side of head: No submental or submandibular adenopathy.     Left side of head: No submental or submandibular adenopathy.     Cervical: No cervical adenopathy.  Skin:    General: Skin is warm and dry.     Coloration: Skin is not pale.     Findings: No rash.     Nails: There is no clubbing.  Neurological:     Mental Status: She is alert and oriented to person, place, and time. Mental status is at baseline.     Cranial Nerves: No cranial nerve deficit.     Sensory: Sensory deficit present.     Motor: Weakness present.     Coordination: Coordination abnormal.     Gait: Gait abnormal.     Deep Tendon Reflexes: Reflexes normal.  Psychiatric:        Mood and Affect: Mood normal.        Speech: Speech normal.        Behavior: Behavior normal.        Thought Content: Thought content normal.        Judgment: Judgment normal.         SIGNIFICANT DIAGNOSTIC STUDIES  Imaging Results  CT HEAD WO CONTRAST (5MM)   Result Date: 12/31/2021 CLINICAL DATA:  Stroke, follow-up EXAM: CT HEAD WITHOUT CONTRAST TECHNIQUE: Contiguous axial images were obtained from the base of the skull through the vertex without intravenous contrast. RADIATION DOSE REDUCTION: This exam was performed according  to the departmental dose-optimization program which includes automated exposure control, adjustment of the mA and/or kV according to patient size and/or use of iterative reconstruction technique. COMPARISON:  12/29/2021 FINDINGS: Brain: Evolving recent infarction involving the left corona radiata and basal ganglia. Chronic bilateral basal ganglia infarcts. Additional patchy hypoattenuation in the supratentorial white matter likely reflects nonspecific gliosis/demyelination. No acute intracranial hemorrhage or mass effect. Ventricles are normal in size. No extra-axial collection. Vascular: There is mild atherosclerotic calcification at the skull base. Skull: Calvarium is  unremarkable. Sinuses/Orbits: No acute finding. Other: None. IMPRESSION: Evolving recent infarction of left corona radiata and basal ganglia. No acute intracranial hemorrhage. Chronic infarcts and chronic microvascular ischemic changes. Electronically Signed   By: Macy Mis M.D.   On: 12/31/2021 10:52    CT HEAD WO CONTRAST (5MM)   Result Date: 12/08/2021 CLINICAL DATA:  Headache, chronic, no new features EXAM: CT HEAD WITHOUT CONTRAST TECHNIQUE: Contiguous axial images were obtained from the base of the skull through the vertex without intravenous contrast. RADIATION DOSE REDUCTION: This exam was performed according to the departmental dose-optimization program which includes automated exposure control, adjustment of the mA and/or kV according to patient size and/or use of iterative reconstruction technique. COMPARISON:  05/23/2021 FINDINGS: Brain: No evidence of acute infarction, hemorrhage, hydrocephalus, extra-axial collection or mass lesion/mass effect. Chronic bilateral basal ganglia lacunar infarcts. Mild scattered low-density changes within the periventricular and subcortical white matter compatible with chronic microvascular ischemic change. Vascular: No hyperdense vessel or unexpected calcification. Skull: Normal. Negative for fracture or focal lesion. Sinuses/Orbits: No acute finding. Other: None. IMPRESSION: 1. No acute intracranial abnormality. 2. Mild chronic microvascular ischemic change. Electronically Signed   By: Davina Poke D.O.   On: 12/08/2021 13:46    MR BRAIN WO CONTRAST   Result Date: 12/30/2021 CLINICAL DATA:  Provided history: Stroke, follow-up. EXAM: MRI HEAD WITHOUT CONTRAST TECHNIQUE: Multiplanar, multiecho pulse sequences of the brain and surrounding structures were obtained without intravenous contrast. COMPARISON:  CT angiogram head/neck 12/29/2021. Noncontrast head CT 12/29/2021. Brain MRI 05/23/2021. FINDINGS: Brain: The patient was unable to tolerate the full  examination. As a result, axial T1 weighted and coronal T2 TSE sequences could not be obtained. The acquired sequences are intermittently motion degraded. Most notably, there is severe motion degradation of the axial SWI sequence. No age advanced or lobar predominant parenchymal atrophy. 2.4 x 1.1 x 2.0 cm acute infarct within the left corona radiata/basal ganglia. Some residual diffusion weighted hyperintensity at site of a chronic infarct within the right corona radiata. Chronic lacunar infarcts within the bilateral corona radiata/deep gray nuclei. Moderate (and significantly advanced for age) multifocal T2 FLAIR hyperintense signal abnormality elsewhere within the cerebral white matter, nonspecific but likely reflecting chronic small vessel ischemic disease given the patient's history of hypertension, diabetes and hyperlipidemia. Mild chronic small-vessel ischemic changes are also present within the pons No evidence of an intracranial mass. No extra-axial fluid collection. No midline shift. Vascular: Maintained flow voids within the proximal large arterial vessels. Skull and upper cervical spine: No focal suspicious marrow lesion. Sinuses/Orbits: Visualized orbits show no acute finding. Minimal scattered paranasal sinus mucosal thickening. IMPRESSION: Prematurely terminated and motion degraded examination. 2.4 x 1.1 x 2.0 cm acute infarct within the left corona radiata/basal ganglia. Superimposed chronic small-vessel infarcts within the bilateral corona radiata/basal ganglia. Background moderate cerebral white matter chronic small vessel ischemic disease. Mild chronic small vessel ischemic changes are also present within the pons. Electronically Signed   By: Kellie Simmering D.O.  On: 12/30/2021 13:20    DG CHEST PORT 1 VIEW   Result Date: 12/29/2021 CLINICAL DATA:  CVA EXAM: PORTABLE CHEST 1 VIEW COMPARISON:  06/18/2018 chest radiograph FINDINGS: The cardiomediastinal silhouette is unremarkable. There is no  evidence of focal airspace disease, pulmonary edema, suspicious pulmonary nodule/mass, pleural effusion, or pneumothorax. No acute bony abnormalities are identified. IMPRESSION: No active disease. Electronically Signed   By: Margarette Canada M.D.   On: 12/29/2021 16:36    EEG adult   Result Date: 12/31/2021 Stacey Havens, MD     12/31/2021  2:52 PM Patient Name: Stacey Lucas MRN: [id number] Epilepsy Attending: Lora Lucas Referring Physician/Provider: Garvin Fila, MD Date: 12/31/2021 Duration: 21.53 mins Patient history: 46 year old female with acute left corona radiata/basal ganglia stroke.  EEG evaluate for seizure. Level of alertness: Awake AEDs during EEG study: None Technical aspects: This EEG study was done with scalp electrodes positioned according to the 10-20 International system of electrode placement. Electrical activity was acquired at a sampling rate of _0  and reviewed with a high frequency filter of _1  and a low frequency filter of _2 . EEG data were recorded continuously and digitally stored. Description: The posterior dominant rhythm consists of 9 Hz activity of moderate voltage (25-35 uV) seen predominantly in posterior head regions, symmetric and reactive to eye opening and eye closing. Hyperventilation and photic stimulation were not performed.   IMPRESSION: This study is within normal limits. No seizures or epileptiform discharges were seen throughout the recording. Stacey Lucas    VAS [place] TRANSCRANIAL DOPPLER W BUBBLES   Result Date: 01/01/2022  Transcranial Doppler with Bubble Patient Name:  Stacey Lucas  Date of Exam:   12/31/2021 Medical Rec #: [id number]     Accession #:    [id number] Date of Birth: [birth year]    Patient Gender: F Patient Age:   35 years Exam Location:  [facility] Procedure:      VAS [place] TRANSCRANIAL DOPPLER W BUBBLES Referring Phys: Janine Ores --------------------------------------------------------------------------------  Indications: Stroke.  Comparison Study: No priors. Performing Technologist: Stacey Lucas RDMS, RVT  Examination Guidelines: A complete evaluation includes B-mode imaging, spectral Doppler, color Doppler, and power Doppler as needed of all accessible portions of each vessel. Bilateral testing is considered an integral part of a complete examination. Limited examinations for reoccurring indications may be performed as noted.  Summary: No HITS at rest or during Valsalva. Negative transcranial Doppler Bubble study with no evidence of right to left intracardiac communication.  A vascular evaluation was performed. The right middle cerebral artery was studied. An IV was inserted into the patient's left forearm. Verbal informed consent was obtained.  NEGATIVE TCD Bubble study with no evidence of right to left shunt noted. *See table(s) above for TCD measurements and observations.  Diagnosing physician: Antony Contras MD Electronically signed by Antony Contras MD on 01/01/2022 at 10:54:57 AM.    Final     ECHOCARDIOGRAM COMPLETE   Result Date: 12/30/2021    ECHOCARDIOGRAM REPORT   Patient Name:   Stacey Lucas Date of Exam: 12/30/2021 Medical Rec #:  [id number]    Height:       65.0 in Accession #:    [id number]   Weight:       153.0 lb Date of Birth:  1975/11/23   BSA:          1.765 m Patient Age:    40 years     BP:  154/100 mmHg Patient Gender: F            HR:           78 bpm. Exam Location:  Inpatient Procedure: 2D Echo Indications:    stroke  History:        Patient has prior history of Echocardiogram examinations, most                 recent 05/23/2021. Arrythmias:Atrial Fibrillation; Risk                 Factors:Diabetes.  Sonographer:    Stacey Lucas RDCS Referring Phys: [id number] Stacey Lucas IMPRESSIONS  1. Left ventricular ejection fraction, by estimation, is 70 to 75%. Left ventricular ejection fraction by PLAX is 71 %. The left ventricle has hyperdynamic function. The left ventricle has no regional wall motion  abnormalities. There is mild left ventricular hypertrophy. Left ventricular diastolic parameters are consistent with Grade I diastolic dysfunction (impaired relaxation).  2. Right ventricular systolic function is normal. The right ventricular size is normal. Tricuspid regurgitation signal is inadequate for assessing PA pressure.  3. The mitral valve is grossly normal. No evidence of mitral valve regurgitation.  4. The aortic valve is tricuspid. Aortic valve regurgitation is not visualized.  5. The inferior vena cava is normal in size with <50% respiratory variability, suggesting right atrial pressure of 8 mmHg. Comparison(s): Changes from prior study are noted. 05/23/2021: LVEF 60-65%, mild LVH, normal diastolic function. FINDINGS  Left Ventricle: Left ventricular ejection fraction, by estimation, is 70 to 75%. Left ventricular ejection fraction by PLAX is 71 %. The left ventricle has hyperdynamic function. The left ventricle has no regional wall motion abnormalities. The left ventricular internal cavity size was normal in size. There is mild left ventricular hypertrophy. Left ventricular diastolic parameters are consistent with Grade I diastolic dysfunction (impaired relaxation). Indeterminate filling pressures. Right Ventricle: The right ventricular size is normal. No increase in right ventricular wall thickness. Right ventricular systolic function is normal. Tricuspid regurgitation signal is inadequate for assessing PA pressure. Left Atrium: Left atrial size was normal in size. Right Atrium: Right atrial size was normal in size. Pericardium: There is no evidence of pericardial effusion. Mitral Valve: The mitral valve is grossly normal. No evidence of mitral valve regurgitation. Tricuspid Valve: The tricuspid valve is normal in structure. Tricuspid valve regurgitation is not demonstrated. Aortic Valve: The aortic valve is tricuspid. Aortic valve regurgitation is not visualized. Pulmonic Valve: The pulmonic valve  was normal in structure. Pulmonic valve regurgitation is not visualized. Aorta: The aortic root and ascending aorta are structurally normal, with no evidence of dilitation. Venous: The inferior vena cava is normal in size with less than 50% respiratory variability, suggesting right atrial pressure of 8 mmHg. IAS/Shunts: No atrial level shunt detected by color flow Doppler.  LEFT VENTRICLE PLAX 2D LV EF:         Left            Diastology                ventricular     LV e' medial:    6.74 cm/s                ejection        LV E/e' medial:  15.4                fraction by     LV e' lateral:   7.07 cm/s  PLAX is 71      LV E/e' lateral: 14.7                %. LVIDd:         4.50 cm LVIDs:         2.70 cm LV PW:         1.20 cm LV IVS:        0.90 cm LVOT diam:     2.00 cm LV SV:         56 LV SV Index:   32 LVOT Area:     3.14 cm  RIGHT VENTRICLE             IVC RV S prime:     10.40 cm/s  IVC diam: 1.90 cm TAPSE (M-mode): 2.0 cm LEFT ATRIUM             Index        RIGHT ATRIUM           Index LA diam:        3.50 cm 1.98 cm/m   RA Area:     10.50 cm LA Vol (A2C):   47.0 ml 26.62 ml/m  RA Volume:   20.60 ml  11.67 ml/m LA Vol (A4C):   44.5 ml 25.21 ml/m LA Biplane Vol: 46.8 ml 26.51 ml/m  AORTIC VALVE LVOT Vmax:   85.20 cm/s LVOT Vmean:  58.200 cm/s LVOT VTI:    0.179 m  AORTA Ao Root diam: 2.60 cm Ao Asc diam:  2.70 cm MITRAL VALVE MV Area (PHT): 3.99 cm     SHUNTS MV Decel Time: 190 msec     Systemic VTI:  0.18 m MV E velocity: 104.00 cm/s  Systemic Diam: 2.00 cm MV A velocity: 116.00 cm/s MV E/A ratio:  0.90 Lyman Bishop MD Electronically signed by Lyman Bishop MD Signature Date/Time: 12/30/2021/12:23:51 PM    Final     ECHO TEE   Result Date: 01/01/2022    TRANSESOPHOGEAL ECHO REPORT   Patient Name:   Stacey Lucas Date of Exam: 01/01/2022 Medical Rec #:  [id number]    Height:       65.0 in Accession #:    [id number]   Weight:       153.0 lb Date of Birth:  1976/06/06   BSA:           1.765 m Patient Age:    8 years     BP:           172/80 mmHg Patient Gender: F            HR:           87 bpm. Exam Location:  Inpatient Procedure: Transesophageal Echo, Cardiac Doppler, Color Doppler and [place]ine            Contrast Bubble Study Indications:     Stroke  History:         Patient has prior history of Echocardiogram examinations, most                  recent 12/30/2021. Arrythmias:Atrial Fibrillation; Risk                  Factors:Diabetes, Hypertension, Dyslipidemia and Current                  Smoker.  Sonographer:     Clayton Lefort RDCS (AE) Referring Phys:  [place] Diagnosing Phys: Stacey Lucas PROCEDURE: After  discussion of the risks and benefits of a TEE, an informed consent was obtained from the patient. The transesophogeal probe was passed without difficulty through the esophogus of the patient. Sedation performed by different physician. The patient was monitored while under deep sedation. Anesthestetic sedation was provided intravenously by Anesthesiology: 124.36m of Propofol, 616mof Lidocaine. Image quality was good. The patient developed no complications during the procedure. IMPRESSIONS  1. Left ventricular ejection fraction, by estimation, is 60 to 65%. The left ventricle has normal function.  2. Right ventricular systolic function is normal. The right ventricular size is normal.  3. No left atrial/left atrial appendage thrombus was detected. The LAA emptying velocity was 60 cm/s.  4. The mitral valve is normal in structure. No evidence of mitral valve regurgitation.  5. The aortic valve is normal in structure. Aortic valve regurgitation is not visualized.  6. There is mild (Grade II) plaque.  7. Agitated saline contrast bubble study was negative, with no evidence of any interatrial shunt. Conclusion(s)/Recommendation(s): No LA/LAA thrombus identified. Negative bubble study for interatrial shunt. No intracardiac source of embolism detected on this on this transesophageal  echocardiogram. FINDINGS  Left Ventricle: Left ventricular ejection fraction, by estimation, is 60 to 65%. The left ventricle has normal function. The left ventricular internal cavity size was normal in size. Right Ventricle: The right ventricular size is normal. No increase in right ventricular wall thickness. Right ventricular systolic function is normal. Left Atrium: No left atrial/left atrial appendage thrombus was detected. The LAA emptying velocity was 60 cm/s. Pericardium: There is no evidence of pericardial effusion. Mitral Valve: The mitral valve is normal in structure. No evidence of mitral valve regurgitation. Tricuspid Valve: The tricuspid valve is normal in structure. Tricuspid valve regurgitation is not demonstrated. Aortic Valve: The aortic valve is normal in structure. Aortic valve regurgitation is not visualized. Pulmonic Valve: The pulmonic valve was normal in structure. Pulmonic valve regurgitation is not visualized. Aorta: The aortic root and ascending aorta are structurally normal, with no evidence of dilitation. There is mild (Grade II) plaque. IAS/Shunts: Agitated saline contrast was given intravenously to evaluate for intracardiac shunting. Agitated saline contrast bubble study was negative, with no evidence of any interatrial shunt. Stacey Incheslectronically signed by Stacey Inchesignature Date/Time: 01/01/2022/11:57:28 AM    Final     CT HEAD CODE STROKE WO CONTRAST   Result Date: 12/29/2021 CLINICAL DATA:  Code stroke.  Left facial droop and slurred speech EXAM: CT HEAD WITHOUT CONTRAST TECHNIQUE: Contiguous axial images were obtained from the base of the skull through the vertex without intravenous contrast. RADIATION DOSE REDUCTION: This exam was performed according to the departmental dose-optimization program which includes automated exposure control, adjustment of the mA and/or kV according to patient size and/or use of iterative reconstruction technique. COMPARISON:  12/08/2021  FINDINGS: Brain: No evidence of acute infarction, hemorrhage, hydrocephalus, extra-axial collection or mass lesion/mass effect. Chronic small vessel ischemic gliosis in the cerebral white matter. Chronic perforator infarct at the left basal ganglia. Vascular: No hyperdense vessel or unexpected calcification. Skull: Normal. Negative for fracture or focal lesion. Sinuses/Orbits: No acute finding. Other: These results were communicated to Dr. ArRory Percyt 12:36 pm on 12/29/2021 by text page via the [facility]aging system. ASPECTS ([facility] Program Early CT Score) - Ganglionic level infarction (caudate, lentiform nuclei, internal capsule, insula, M1-M3 cortex): 7 - Supraganglionic infarction (M4-M6 cortex): 3 Total score (0-10 with 10 being normal): 10 IMPRESSION: 1. No acute finding. 2. Premature chronic small vessel disease.  Electronically Signed   By: Jorje Guild M.D.   On: 12/29/2021 12:36    CT ANGIO HEAD NECK W WO CM (CODE STROKE)   Result Date: 12/29/2021 CLINICAL DATA:  Provided history: Neuro deficit, acute, stroke suspected. Left-sided weakness. Slurred speech. EXAM: CT ANGIOGRAPHY HEAD AND NECK TECHNIQUE: Multidetector CT imaging of the head and neck was performed using the standard protocol during bolus administration of intravenous contrast. Multiplanar CT image reconstructions and MIPs were obtained to evaluate the vascular anatomy. Carotid stenosis measurements (when applicable) are obtained utilizing NASCET criteria, using the distal internal carotid diameter as the denominator. RADIATION DOSE REDUCTION: This exam was performed according to the departmental dose-optimization program which includes automated exposure control, adjustment of the mA and/or kV according to patient size and/or use of iterative reconstruction technique. CONTRAST:  164m OMNIPAQUE IOHEXOL 350 MG/ML SOLN COMPARISON:  Noncontrast head CT performed earlier today 12/29/2021. Brain MRI 05/23/2021. CT angiogram head/neck  05/23/2021. FINDINGS: CTA NECK FINDINGS Aortic arch: The visualized aortic arch is normal in caliber. Streak and beam hardening artifact arising from a dense left-sided contrast bolus partially obscures the innominate artery. Within this limitation, there is no appreciable hemodynamically significant innominate or proximal subclavian artery stenosis. Right carotid system: CCA and ICA patent within the neck without stenosis or significant atherosclerotic disease. Left carotid system: CCA and ICA patent within the neck without stenosis or significant atherosclerotic disease. Vertebral arteries: Vertebral arteries codominant and patent within the neck without stenosis or significant atherosclerotic disease. Skeleton: Cervical spondylosis. No acute bony abnormality or aggressive osseous lesion. Other neck: 1.8 cm right thyroid lobe nodule. Upper chest: No consolidation within the imaged lung apices. Review of the MIP images confirms the above findings CTA HEAD FINDINGS Anterior circulation: The intracranial internal carotid arteries are patent. The M1 middle cerebral arteries are patent. No no M2 proximal Lucas occlusion is identified. Atherosclerotic irregularity of the M2 and more distal MCA vessels, bilaterally. Most notably, there is an apparent moderate/severe focal stenosis within a superior division mid M2 left MCA vessel (series 12, image 25). The anterior cerebral arteries are patent. No intracranial aneurysm is identified. Posterior circulation: The intracranial vertebral arteries are patent. The basilar artery is patent. The posterior cerebral arteries are patent. Posterior communicating arteries are diminutive or absent bilaterally. Venous sinuses: Within the limitations of contrast timing, no convincing thrombus. Anatomic variants: As described. Review of the MIP images confirms the above findings No emergent large vessel occlusion identified. These results were communicated to Dr. ARory Percyat 12:55 pmon  4/22/2023by text page via the [facility]messaging system. IMPRESSION: CTA neck: 1. The common carotid, internal carotid and vertebral arteries are patent within the neck without stenosis. 2. 1.8 cm right thyroid lobe nodule. A non-emergent thyroid ultrasound is recommended for further evaluation. CTA head: 1. No intracranial large vessel occlusion is identified. 2. Atherosclerotic irregularity of the M2 and more distal MCA vessels, bilaterally. Most notably, there is an apparent moderate/severe focal stenosis within a superior division mid M2 left MCA vessel. Electronically Signed   By: KKellie SimmeringD.O.   On: 12/29/2021 12:59     Assessment & Plan:   Problem List Items Addressed This Visit   None Check urine for microalbumin No follow-ups on file.   PAsencion Noble MD

## 2022-09-10 ENCOUNTER — Ambulatory Visit: Payer: BC Managed Care – PPO | Admitting: Critical Care Medicine

## 2022-09-12 ENCOUNTER — Ambulatory Visit: Payer: BC Managed Care – PPO | Admitting: Physician Assistant

## 2022-09-17 ENCOUNTER — Ambulatory Visit: Payer: BC Managed Care – PPO | Admitting: Critical Care Medicine

## 2022-10-21 ENCOUNTER — Ambulatory Visit: Payer: Self-pay | Admitting: *Deleted

## 2022-10-21 NOTE — Telephone Encounter (Signed)
  Chief Complaint: bilateral lower feet and legs swollen  Symptoms: reports face swelling left side and left eye but not now. Comes and goes. Bilateral feet legs up to knees swollen. Can walk . No redness or weeping skin  Frequency: "for a while now" Pertinent Negatives: Patient denies fever no chest pain or difficulty breathing reported.  Disposition: [] ED /[x] Urgent Care (no appt availability in office) / [] Appointment(In office/virtual)/ []  [facility]/ [] Home Care/ [] Refused Recommended Disposition /[] [facility]/ []  Follow-up with PCP Additional Notes:   Recommended UC or ED due to no transportation today for available OV.  Unsure if patient will attempt transportation to UC or ED today . Patient reports she will need advance notice for OV. Recommended to be evaluated today due to sx . Please advise patient would like a call back.     Reason for Disposition  Swelling of both lower legs (i.e., bilateral pedal edema)  Answer Assessment - Initial Assessment Questions 1. ONSET: "When did the swelling start?" (e.g., minutes, hours, days)     "For a while" but reports not now  2. LOCATION: "What part of the face is swollen?"     Left side of face and eye  3. SEVERITY: "How swollen is it?"     3 times swollen as the other side when swollen  4. ITCHING: "Is there any itching?" If Yes, ask: "How much?"   (Scale 1-10; mild, moderate or severe)     No  5. PAIN: "Is the swelling painful to touch?" If Yes, ask: "How painful is it?"   (Scale 1-10; mild, moderate or severe)   - NONE (0): no pain   - MILD (1-3): doesn't interfere with normal activities    - MODERATE (4-7): interferes with normal activities or awakens from sleep    - SEVERE (8-10): excruciating pain, unable to do any normal activities      Denies  6. FEVER: "Do you have a fever?" If Yes, ask: "What is it, how was it measured, and when did it start?"      Na  7. CAUSE: "What do you think is causing the face  swelling?"     Not sure  8. RECURRENT SYMPTOM: "Have you had face swelling before?" If Yes, ask: "When was the last time?" "What happened that time?"     Yes comes and goes.  9. OTHER SYMPTOMS: "Do you have any other symptoms?" (e.g., toothache, leg swelling)     Bilateral feet and legs up to knees swelling  10. PREGNANCY: "Is there any chance you are pregnant?" "When was your last menstrual period?"       na  Protocols used: Face Swelling-A-AH

## 2022-10-21 NOTE — Telephone Encounter (Signed)
Call placed to patient unable to reach message left on VM.

## 2022-10-24 ENCOUNTER — Ambulatory Visit: Payer: BC Managed Care – PPO | Admitting: Critical Care Medicine

## 2022-11-04 ENCOUNTER — Other Ambulatory Visit: Payer: Self-pay | Admitting: Critical Care Medicine

## 2022-11-05 ENCOUNTER — Other Ambulatory Visit: Payer: Self-pay | Admitting: Critical Care Medicine

## 2022-11-05 NOTE — Telephone Encounter (Signed)
Future visit in 1 week .  Requested Prescriptions  Pending Prescriptions Disp Refills   rosuvastatin (CRESTOR) 20 MG tablet [Pharmacy Med Name: ROSUVASTATIN '20MG'$  TABLETS] 30 tablet 0    Sig: TAKE 1 TABLET(20 MG) BY MOUTH AT BEDTIME     Cardiovascular:  Antilipid - Statins 2 Failed - 11/04/2022 11:11 AM      Failed - Cr in normal range and within 360 days    Creatinine, Ser  Date Value Ref Range Status  03/05/2022 1.68 (H) 0.57 - 1.00 mg/dL Final         Failed - Lipid Panel in normal range within the last 12 months    Cholesterol, Total  Date Value Ref Range Status  03/05/2022 218 (H) 100 - 199 mg/dL Final   LDL Chol Calc (NIH)  Date Value Ref Range Status  03/05/2022 120 (H) 0 - 99 mg/dL Final   HDL  Date Value Ref Range Status  03/05/2022 76 >39 mg/dL Final   Triglycerides  Date Value Ref Range Status  03/05/2022 129 0 - 149 mg/dL Final         Passed - Patient is not pregnant      Passed - Valid encounter within last 12 months    Recent Outpatient Visits           6 months ago Type 2 diabetes mellitus without complication, unspecified whether long term insulin use ([place])   [place] Elsie Stain, MD   8 months ago Cerebrovascular accident (CVA) due to thrombosis of precerebral artery [facility])   [place] Elsie Stain, MD   4 years ago Uncontrolled type 2 diabetes mellitus with hyperglycemia [facility])   Primary Care at [place], [place]   4 years ago Uncontrolled type 2 diabetes mellitus without complication [facility])   Primary Care at [facility] [place], [place]       Future Appointments             In 1 week Thereasa Solo, Dionne Bucy, PA-C [facility]

## 2022-11-06 NOTE — Telephone Encounter (Signed)
Last RF 11/05/22 #30  Requested Prescriptions  Refused Prescriptions Disp Refills   rosuvastatin (CRESTOR) 20 MG tablet [Pharmacy Med Name: ROSUVASTATIN '20MG'$  TABLETS] 90 tablet     Sig: TAKE 1 TABLET(20 MG) BY MOUTH AT BEDTIME     Cardiovascular:  Antilipid - Statins 2 Failed - 11/05/2022  1:14 PM      Failed - Cr in normal range and within 360 days    Creatinine, Ser  Date Value Ref Range Status  03/05/2022 1.68 (H) 0.57 - 1.00 mg/dL Final         Failed - Lipid Panel in normal range within the last 12 months    Cholesterol, Total  Date Value Ref Range Status  03/05/2022 218 (H) 100 - 199 mg/dL Final   LDL Chol Calc (NIH)  Date Value Ref Range Status  03/05/2022 120 (H) 0 - 99 mg/dL Final   HDL  Date Value Ref Range Status  03/05/2022 76 >39 mg/dL Final   Triglycerides  Date Value Ref Range Status  03/05/2022 129 0 - 149 mg/dL Final         Passed - Patient is not pregnant      Passed - Valid encounter within last 12 months    Recent Outpatient Visits           6 months ago Type 2 diabetes mellitus without complication, unspecified whether long term insulin use ([place])   [place] Elsie Stain, MD   8 months ago Cerebrovascular accident (CVA) due to thrombosis of precerebral artery [facility])   [place] Elsie Stain, MD   4 years ago Uncontrolled type 2 diabetes mellitus with hyperglycemia [facility])   Primary Care at [place], [place]   4 years ago Uncontrolled type 2 diabetes mellitus without complication [facility])   Primary Care at [facility], [place], [place]       Future Appointments             In 1 week Thereasa Solo, Dionne Bucy, PA-C [place]

## 2022-11-14 ENCOUNTER — Observation Stay (HOSPITAL_COMMUNITY): Payer: BC Managed Care – PPO

## 2022-11-14 ENCOUNTER — Inpatient Hospital Stay (HOSPITAL_COMMUNITY)
Admission: EM | Admit: 2022-11-14 | Discharge: 2022-11-21 | DRG: 065 | Disposition: A | Discharge status: Home Health | Payer: BC Managed Care – PPO | Source: Ambulatory Visit | Attending: Internal Medicine | Admitting: Internal Medicine

## 2022-11-14 ENCOUNTER — Emergency Department (HOSPITAL_COMMUNITY): Payer: BC Managed Care – PPO

## 2022-11-14 ENCOUNTER — Other Ambulatory Visit: Payer: Self-pay

## 2022-11-14 ENCOUNTER — Encounter (HOSPITAL_COMMUNITY): Payer: Self-pay | Admitting: Internal Medicine

## 2022-11-14 ENCOUNTER — Ambulatory Visit: Payer: BC Managed Care – PPO | Attending: Physician Assistant | Admitting: Physician Assistant

## 2022-11-14 ENCOUNTER — Other Ambulatory Visit (HOSPITAL_COMMUNITY): Payer: BC Managed Care – PPO

## 2022-11-14 VITALS — BP 144/80 | HR 76 | Wt 156.8 lb

## 2022-11-14 DIAGNOSIS — I69351 Hemiplegia and hemiparesis following cerebral infarction affecting right dominant side: Secondary | ICD-10-CM

## 2022-11-14 DIAGNOSIS — F1410 Cocaine abuse, uncomplicated: Secondary | ICD-10-CM | POA: Diagnosis present

## 2022-11-14 DIAGNOSIS — F32A Depression, unspecified: Secondary | ICD-10-CM | POA: Diagnosis present

## 2022-11-14 DIAGNOSIS — Z7982 Long term (current) use of aspirin: Secondary | ICD-10-CM

## 2022-11-14 DIAGNOSIS — D6869 Other thrombophilia: Secondary | ICD-10-CM | POA: Diagnosis present

## 2022-11-14 DIAGNOSIS — K219 Gastro-esophageal reflux disease without esophagitis: Secondary | ICD-10-CM | POA: Diagnosis present

## 2022-11-14 DIAGNOSIS — E781 Pure hyperglyceridemia: Secondary | ICD-10-CM | POA: Diagnosis present

## 2022-11-14 DIAGNOSIS — R29703 NIHSS score 3: Secondary | ICD-10-CM | POA: Diagnosis present

## 2022-11-14 DIAGNOSIS — R0781 Pleurodynia: Secondary | ICD-10-CM | POA: Diagnosis present

## 2022-11-14 DIAGNOSIS — Z9189 Other specified personal risk factors, not elsewhere classified: Secondary | ICD-10-CM | POA: Diagnosis not present

## 2022-11-14 DIAGNOSIS — Z5941 Food insecurity: Secondary | ICD-10-CM

## 2022-11-14 DIAGNOSIS — I639 Cerebral infarction, unspecified: Secondary | ICD-10-CM | POA: Diagnosis not present

## 2022-11-14 DIAGNOSIS — D72829 Elevated white blood cell count, unspecified: Secondary | ICD-10-CM | POA: Diagnosis not present

## 2022-11-14 DIAGNOSIS — R531 Weakness: Secondary | ICD-10-CM

## 2022-11-14 DIAGNOSIS — E1165 Type 2 diabetes mellitus with hyperglycemia: Secondary | ICD-10-CM

## 2022-11-14 DIAGNOSIS — E872 Acidosis, unspecified: Secondary | ICD-10-CM | POA: Diagnosis present

## 2022-11-14 DIAGNOSIS — I6329 Cerebral infarction due to unspecified occlusion or stenosis of other precerebral arteries: Secondary | ICD-10-CM | POA: Diagnosis not present

## 2022-11-14 DIAGNOSIS — N179 Acute kidney failure, unspecified: Secondary | ICD-10-CM | POA: Diagnosis not present

## 2022-11-14 DIAGNOSIS — R8281 Pyuria: Secondary | ICD-10-CM | POA: Diagnosis present

## 2022-11-14 DIAGNOSIS — Z5982 Transportation insecurity: Secondary | ICD-10-CM

## 2022-11-14 DIAGNOSIS — M545 Low back pain, unspecified: Secondary | ICD-10-CM | POA: Diagnosis present

## 2022-11-14 DIAGNOSIS — F17210 Nicotine dependence, cigarettes, uncomplicated: Secondary | ICD-10-CM | POA: Diagnosis present

## 2022-11-14 DIAGNOSIS — Z8249 Family history of ischemic heart disease and other diseases of the circulatory system: Secondary | ICD-10-CM

## 2022-11-14 DIAGNOSIS — R471 Dysarthria and anarthria: Secondary | ICD-10-CM | POA: Diagnosis present

## 2022-11-14 DIAGNOSIS — M1711 Unilateral primary osteoarthritis, right knee: Secondary | ICD-10-CM | POA: Diagnosis present

## 2022-11-14 DIAGNOSIS — T45526A Underdosing of antithrombotic drugs, initial encounter: Secondary | ICD-10-CM | POA: Diagnosis present

## 2022-11-14 DIAGNOSIS — I1310 Hypertensive heart and chronic kidney disease without heart failure, with stage 1 through stage 4 chronic kidney disease, or unspecified chronic kidney disease: Secondary | ICD-10-CM | POA: Diagnosis present

## 2022-11-14 DIAGNOSIS — D631 Anemia in chronic kidney disease: Secondary | ICD-10-CM | POA: Diagnosis present

## 2022-11-14 DIAGNOSIS — R4781 Slurred speech: Secondary | ICD-10-CM | POA: Diagnosis not present

## 2022-11-14 DIAGNOSIS — N049 Nephrotic syndrome with unspecified morphologic changes: Secondary | ICD-10-CM

## 2022-11-14 DIAGNOSIS — R29810 Facial weakness: Secondary | ICD-10-CM | POA: Diagnosis present

## 2022-11-14 DIAGNOSIS — Z8261 Family history of arthritis: Secondary | ICD-10-CM

## 2022-11-14 DIAGNOSIS — I6381 Other cerebral infarction due to occlusion or stenosis of small artery: Secondary | ICD-10-CM | POA: Diagnosis not present

## 2022-11-14 DIAGNOSIS — F419 Anxiety disorder, unspecified: Secondary | ICD-10-CM | POA: Diagnosis present

## 2022-11-14 DIAGNOSIS — R824 Acetonuria: Secondary | ICD-10-CM | POA: Diagnosis present

## 2022-11-14 DIAGNOSIS — G8929 Other chronic pain: Secondary | ICD-10-CM | POA: Diagnosis present

## 2022-11-14 DIAGNOSIS — I10 Essential (primary) hypertension: Secondary | ICD-10-CM

## 2022-11-14 DIAGNOSIS — Z833 Family history of diabetes mellitus: Secondary | ICD-10-CM

## 2022-11-14 DIAGNOSIS — R197 Diarrhea, unspecified: Secondary | ICD-10-CM | POA: Diagnosis not present

## 2022-11-14 DIAGNOSIS — Z91138 Patient's unintentional underdosing of medication regimen for other reason: Secondary | ICD-10-CM

## 2022-11-14 DIAGNOSIS — Z7902 Long term (current) use of antithrombotics/antiplatelets: Secondary | ICD-10-CM

## 2022-11-14 DIAGNOSIS — Z23 Encounter for immunization: Secondary | ICD-10-CM

## 2022-11-14 DIAGNOSIS — E1122 Type 2 diabetes mellitus with diabetic chronic kidney disease: Secondary | ICD-10-CM | POA: Diagnosis present

## 2022-11-14 DIAGNOSIS — E876 Hypokalemia: Secondary | ICD-10-CM | POA: Diagnosis present

## 2022-11-14 DIAGNOSIS — Z7984 Long term (current) use of oral hypoglycemic drugs: Secondary | ICD-10-CM

## 2022-11-14 DIAGNOSIS — F332 Major depressive disorder, recurrent severe without psychotic features: Secondary | ICD-10-CM

## 2022-11-14 DIAGNOSIS — Z79899 Other long term (current) drug therapy: Secondary | ICD-10-CM

## 2022-11-14 DIAGNOSIS — N1831 Chronic kidney disease, stage 3a: Secondary | ICD-10-CM | POA: Diagnosis present

## 2022-11-14 DIAGNOSIS — E8809 Other disorders of plasma-protein metabolism, not elsewhere classified: Secondary | ICD-10-CM | POA: Diagnosis present

## 2022-11-14 DIAGNOSIS — Z888 Allergy status to other drugs, medicaments and biological substances status: Secondary | ICD-10-CM

## 2022-11-14 LAB — DIFFERENTIAL
Abs Immature Granulocytes: 0.07 10*3/uL (ref 0.00–0.07)
Basophils Absolute: 0.1 10*3/uL (ref 0.0–0.1)
Basophils Relative: 1 %
Eosinophils Absolute: 0.2 10*3/uL (ref 0.0–0.5)
Eosinophils Relative: 1 %
Immature Granulocytes: 0 %
Lymphocytes Relative: 30 %
Lymphs Abs: 4.8 10*3/uL — ABNORMAL HIGH (ref 0.7–4.0)
Monocytes Absolute: 1.1 10*3/uL — ABNORMAL HIGH (ref 0.1–1.0)
Monocytes Relative: 7 %
Neutro Abs: 9.7 10*3/uL — ABNORMAL HIGH (ref 1.7–7.7)
Neutrophils Relative %: 61 %

## 2022-11-14 LAB — URINALYSIS, W/ REFLEX TO CULTURE (INFECTION SUSPECTED)
Bilirubin Urine: NEGATIVE
Glucose, UA: NEGATIVE mg/dL
Hgb urine dipstick: NEGATIVE
Ketones, ur: 5 mg/dL — AB
Nitrite: NEGATIVE
Protein, ur: >=300 mg/dL — AB
Specific Gravity, Urine: 1.018 (ref 1.005–1.030)
pH: 7 (ref 5.0–8.0)

## 2022-11-14 LAB — ETHANOL: Alcohol, Ethyl (B): <10 mg/dL (ref <10)

## 2022-11-14 LAB — TECHNOLOGIST SMEAR REVIEW

## 2022-11-14 LAB — COMPREHENSIVE METABOLIC PANEL
ALT: 12 U/L (ref 0–44)
AST: 24 U/L (ref 15–41)
Albumin: <1.5 g/dL — ABNORMAL LOW (ref 3.5–5.0)
Alkaline Phosphatase: 69 U/L (ref 38–126)
Anion gap: 12 (ref 5–15)
BUN: 35 mg/dL — ABNORMAL HIGH (ref 6–20)
CO2: 17 mmol/L — ABNORMAL LOW (ref 22–32)
Calcium: 8.1 mg/dL — ABNORMAL LOW (ref 8.9–10.3)
Chloride: 109 mmol/L (ref 98–111)
Creatinine, Ser: 4.28 mg/dL — ABNORMAL HIGH (ref 0.44–1.00)
GFR, Estimated: 12 mL/min — ABNORMAL LOW (ref >60)
Glucose, Bld: 103 mg/dL — ABNORMAL HIGH (ref 70–99)
Potassium: 3.9 mmol/L (ref 3.5–5.1)
Sodium: 138 mmol/L (ref 135–145)
Total Bilirubin: 0.4 mg/dL (ref 0.3–1.2)
Total Protein: 5 g/dL — ABNORMAL LOW (ref 6.5–8.1)

## 2022-11-14 LAB — GLUCOSE, CAPILLARY: Glucose-Capillary: 148 mg/dL — ABNORMAL HIGH (ref 70–99)

## 2022-11-14 LAB — POCT GLYCOSYLATED HEMOGLOBIN (HGB A1C): HbA1c, POC (controlled diabetic range): 5.6 % (ref 0.0–7.0)

## 2022-11-14 LAB — CBC
HCT: 29.5 % — ABNORMAL LOW (ref 36.0–46.0)
Hemoglobin: 9.8 g/dL — ABNORMAL LOW (ref 12.0–15.0)
MCH: 25.3 pg — ABNORMAL LOW (ref 26.0–34.0)
MCHC: 33.2 g/dL (ref 30.0–36.0)
MCV: 76.2 fL — ABNORMAL LOW (ref 80.0–100.0)
Platelets: 291 10*3/uL (ref 150–400)
RBC: 3.87 MIL/uL (ref 3.87–5.11)
RDW: 17.1 % — ABNORMAL HIGH (ref 11.5–15.5)
WBC: 16 10*3/uL — ABNORMAL HIGH (ref 4.0–10.5)
nRBC: 0 % (ref 0.0–0.2)

## 2022-11-14 LAB — I-STAT CHEM 8, ED
BUN: 37 mg/dL — ABNORMAL HIGH (ref 6–20)
Calcium, Ion: 1.03 mmol/L — ABNORMAL LOW (ref 1.15–1.40)
Chloride: 109 mmol/L (ref 98–111)
Creatinine, Ser: 4.9 mg/dL — ABNORMAL HIGH (ref 0.44–1.00)
Glucose, Bld: 93 mg/dL (ref 70–99)
HCT: 27 % — ABNORMAL LOW (ref 36.0–46.0)
Hemoglobin: 9.2 g/dL — ABNORMAL LOW (ref 12.0–15.0)
Potassium: 4 mmol/L (ref 3.5–5.1)
Sodium: 138 mmol/L (ref 135–145)
TCO2: 21 mmol/L — ABNORMAL LOW (ref 22–32)

## 2022-11-14 LAB — RAPID URINE DRUG SCREEN, HOSP PERFORMED
Amphetamines: NOT DETECTED
Barbiturates: NOT DETECTED
Benzodiazepines: NOT DETECTED
Cocaine: POSITIVE — AB
Opiates: NOT DETECTED
Tetrahydrocannabinol: NOT DETECTED

## 2022-11-14 LAB — CK: Total CK: 349 U/L — ABNORMAL HIGH (ref 38–234)

## 2022-11-14 LAB — PROTIME-INR
INR: 1 (ref 0.8–1.2)
Prothrombin Time: 13 seconds (ref 11.4–15.2)

## 2022-11-14 LAB — RETICULOCYTES
Immature Retic Fract: 14.2 % (ref 2.3–15.9)
RBC.: 3.22 MIL/uL — ABNORMAL LOW (ref 3.87–5.11)
Retic Count, Absolute: 43.1 10*3/uL (ref 19.0–186.0)
Retic Ct Pct: 1.3 % (ref 0.4–3.1)

## 2022-11-14 LAB — I-STAT BETA HCG BLOOD, ED (MC, WL, AP ONLY): I-stat hCG, quantitative: 7.5 m[IU]/mL — ABNORMAL HIGH (ref <5)

## 2022-11-14 LAB — IRON AND TIBC
Iron: 41 ug/dL (ref 28–170)
Saturation Ratios: 31 % (ref 10.4–31.8)
TIBC: 133 ug/dL — ABNORMAL LOW (ref 250–450)
UIBC: 92 ug/dL

## 2022-11-14 LAB — SODIUM, URINE, RANDOM: Sodium, Ur: 41 mmol/L

## 2022-11-14 LAB — APTT: aPTT: 40 seconds — ABNORMAL HIGH (ref 24–36)

## 2022-11-14 LAB — CBG MONITORING, ED: Glucose-Capillary: 93 mg/dL (ref 70–99)

## 2022-11-14 LAB — CREATININE, URINE, RANDOM: Creatinine, Urine: 97 mg/dL

## 2022-11-14 LAB — GLUCOSE, POCT (MANUAL RESULT ENTRY): POC Glucose: 118 mg/dl — AB (ref 70–99)

## 2022-11-14 LAB — FERRITIN: Ferritin: 232 ng/mL (ref 11–307)

## 2022-11-14 MED ORDER — STROKE: EARLY STAGES OF RECOVERY BOOK
Freq: Once | Status: AC
  Filled 2022-11-14: qty 1

## 2022-11-14 MED ORDER — CITALOPRAM HYDROBROMIDE 10 MG PO TABS: 10.0000 mg | ORAL_TABLET | Freq: Every day | ORAL | 3 refills | Status: DC

## 2022-11-14 MED ORDER — LORAZEPAM 2 MG/ML IJ SOLN
1.0000 mg | Freq: Once | INTRAMUSCULAR | Status: AC
  Administered 2022-11-14: 1 mg via INTRAVENOUS
  Filled 2022-11-14: qty 1

## 2022-11-14 MED ORDER — ORAL CARE MOUTH RINSE: 15.0000 mL | OROMUCOSAL | Status: DC | PRN

## 2022-11-14 MED ORDER — LORAZEPAM 2 MG/ML IJ SOLN: 1.0000 mg | Freq: Once | INTRAMUSCULAR | Status: DC | PRN

## 2022-11-14 MED ORDER — IRBESARTAN 75 MG PO TABS: 75.0000 mg | ORAL_TABLET | Freq: Every day | ORAL | 2 refills | Status: DC

## 2022-11-14 MED ORDER — KETOTIFEN FUMARATE 0.035 % OP SOLN
2.0000 [drp] | Freq: Two times a day (BID) | OPHTHALMIC | Status: DC
  Administered 2022-11-14 – 2022-11-21 (×12): 2 [drp] via OPHTHALMIC
  Filled 2022-11-14 (×3): qty 5

## 2022-11-14 MED ORDER — LABETALOL HCL 5 MG/ML IV SOLN: 10.0000 mg | INTRAVENOUS | Status: DC | PRN

## 2022-11-14 MED ORDER — CLOPIDOGREL BISULFATE 75 MG PO TABS
75.0000 mg | ORAL_TABLET | Freq: Every day | ORAL | Status: DC
  Administered 2022-11-14 – 2022-11-16 (×3): 75 mg via ORAL
  Filled 2022-11-14 (×3): qty 1

## 2022-11-14 MED ORDER — ACETAMINOPHEN 325 MG PO TABS
650.0000 mg | ORAL_TABLET | Freq: Four times a day (QID) | ORAL | Status: DC | PRN
  Administered 2022-11-16 – 2022-11-20 (×3): 650 mg via ORAL
  Filled 2022-11-14 (×3): qty 2

## 2022-11-14 MED ORDER — ROSUVASTATIN CALCIUM 20 MG PO TABS: 10.0000 mg | ORAL_TABLET | Freq: Every day | ORAL | Status: DC

## 2022-11-14 MED ORDER — ASPIRIN 81 MG PO CHEW: 81.0000 mg | CHEWABLE_TABLET | Freq: Once | ORAL | Status: DC

## 2022-11-14 MED ORDER — GLIMEPIRIDE 2 MG PO TABS: 2.0000 mg | ORAL_TABLET | Freq: Every day | ORAL | 2 refills | Status: DC

## 2022-11-14 MED ORDER — INSULIN ASPART 100 UNIT/ML IJ SOLN
0.0000 [IU] | Freq: Three times a day (TID) | INTRAMUSCULAR | Status: DC
  Administered 2022-11-15: 2 [IU] via SUBCUTANEOUS
  Administered 2022-11-15: 3 [IU] via SUBCUTANEOUS
  Administered 2022-11-16 – 2022-11-17 (×2): 2 [IU] via SUBCUTANEOUS
  Administered 2022-11-17 – 2022-11-18 (×4): 1 [IU] via SUBCUTANEOUS
  Administered 2022-11-19: 2 [IU] via SUBCUTANEOUS
  Administered 2022-11-20 – 2022-11-21 (×2): 1 [IU] via SUBCUTANEOUS

## 2022-11-14 MED ORDER — ENOXAPARIN SODIUM 30 MG/0.3ML IJ SOSY
30.0000 mg | PREFILLED_SYRINGE | INTRAMUSCULAR | Status: DC
  Administered 2022-11-14 – 2022-11-15 (×2): 30 mg via SUBCUTANEOUS
  Filled 2022-11-14 (×2): qty 0.3

## 2022-11-14 MED ORDER — CITALOPRAM HYDROBROMIDE 10 MG PO TABS
10.0000 mg | ORAL_TABLET | Freq: Every day | ORAL | Status: DC
  Administered 2022-11-14 – 2022-11-21 (×8): 10 mg via ORAL
  Filled 2022-11-14 (×9): qty 1

## 2022-11-14 MED ORDER — KETOTIFEN FUMARATE 0.035 % OP SOLN: 2.0000 [drp] | Freq: Two times a day (BID) | OPHTHALMIC | Status: DC

## 2022-11-14 MED ORDER — SODIUM CHLORIDE 0.9% FLUSH
3.0000 mL | Freq: Once | INTRAVENOUS | Status: AC
  Administered 2022-11-14: 3 mL via INTRAVENOUS

## 2022-11-14 MED ORDER — ASPIRIN 81 MG PO TBEC
81.0000 mg | DELAYED_RELEASE_TABLET | Freq: Every day | ORAL | Status: DC
  Administered 2022-11-15 – 2022-11-16 (×2): 81 mg via ORAL
  Filled 2022-11-14 (×2): qty 1

## 2022-11-14 MED ORDER — BUPROPION HCL ER (XL) 150 MG PO TB24
150.0000 mg | ORAL_TABLET | Freq: Every day | ORAL | Status: DC
  Administered 2022-11-14 – 2022-11-21 (×8): 150 mg via ORAL
  Filled 2022-11-14 (×9): qty 1

## 2022-11-14 MED ORDER — LACTATED RINGERS IV SOLN: INTRAVENOUS | Status: AC

## 2022-11-14 NOTE — Progress Notes (Signed)
Patient ID: Stacey Lucas, female   DOB: 31-Aug-1976, 47 y.o.   MRN: [id number]     Stacey Lucas, is a 47 y.o. female  [id number]  [id number]  DOB - 1976-07-28  Chief Complaint  Patient presents with   Leg Swelling   Facial Swelling   Medication Refill   Diabetes       Subjective:   Stacey Lucas is a 47 y.o. female here today for swelling of feet and face when she scheduled the appt a few weeks ago.    However today she awakened with slurred speech and progressive R sided weakness.  The patient was brought back in a wheelchair.  I am unfamiliar with a recent baseline with the patient and Immediately grabbed her PCP Dr Asencion Noble.  Denies recent drug use  No problems updated.  ALLERGIES: Allergies  Allergen Reactions   Gabapentin     Weakness/impairs balance    PAST MEDICAL HISTORY: Past Medical History:  Diagnosis Date   Anxiety and depression    Cocaine abuse ([place])    Depression with suicidal ideation    [place] admission 04/2018   Diabetes mellitus without complication (HCC)    Type II   GERD (gastroesophageal reflux disease)    Hyperlipidemia    Hypertension    Impingement syndrome of right shoulder    Migraine headache    Osteoarthritis of right knee 05/24/2021   Pilonidal abscess 05/2013   Pyelonephritis    Right thyroid nodule    Sciatica     MEDICATIONS AT HOME: Prior to Admission medications   Medication Sig Start Date End Date Taking? Authorizing Provider  ACCU-CHEK GUIDE test strip USE UP TO FOUR TIMES DAILY AS DIRECTED 01/31/22  Yes [provider]  Accu-Chek Softclix Lancets lancets SMARTSIG:Topical 1-4 Times Daily 01/31/22  Yes [provider]  amLODipine (NORVASC) 10 MG tablet Take 1 tablet (10 mg total) by mouth daily. 04/18/22  Yes Elsie Stain, MD  aspirin EC 81 MG tablet Take 1 tablet (81 mg total) by mouth daily. Swallow whole. 04/18/22  Yes Elsie Stain, MD  azelastine (OPTIVAR) 0.05 % ophthalmic solution  Place 1 drop into both eyes 2 (two) times daily. 08/21/22  Yes Brunetta Jeans, PA-C  blood glucose meter kit and supplies KIT Dispense based on patient and insurance preference. Use up to four times daily as directed. 01/31/22  Yes [name], MD  buPROPion (WELLBUTRIN XL) 150 MG 24 hr tablet TAKE 1 TABLET(150 MG) BY MOUTH DAILY 04/18/22  Yes Elsie Stain, MD  clopidogrel (PLAVIX) 75 MG tablet TAKE 1 TABLET(75 MG) BY MOUTH DAILY 05/15/22  Yes Elsie Stain, MD  diclofenac Sodium (VOLTAREN) 1 % GEL Apply 4 g topically daily as needed (For knee pain). 04/18/22  Yes Elsie Stain, MD  Insulin Glargine [facility] KWIKPEN) 100 UNIT/ML Inject 5 Units into the skin daily. 04/18/22  Yes Elsie Stain, MD  Insulin Pen Needle 32G X 4 MM MISC Use to inject insulin at bed time 04/18/22  Yes Elsie Stain, MD  meloxicam (MOBIC) 15 MG tablet TAKE 1 TABLET(15 MG) BY MOUTH DAILY 07/01/22  Yes Elsie Stain, MD  metoprolol succinate (TOPROL-XL) 25 MG 24 hr tablet TAKE 3 TABLETS(75 MG) BY MOUTH DAILY 08/16/22  Yes Elsie Stain, MD  rosuvastatin (CRESTOR) 20 MG tablet TAKE 1 TABLET(20 MG) BY MOUTH AT BEDTIME 11/05/22  Yes Elsie Stain, MD  citalopram (CELEXA) 10 MG tablet Take 1 tablet (10 mg total)  by mouth daily. 11/14/22   Argentina Donovan, PA-C  glimepiride (AMARYL) 2 MG tablet Take 1 tablet (2 mg total) by mouth daily with breakfast. 11/14/22   Argentina Donovan, PA-C  irbesartan (AVAPRO) 75 MG tablet Take 1 tablet (75 mg total) by mouth daily. 11/14/22   Argentina Donovan, PA-C     Objective:   Vitals:   11/14/22 0901  BP: (!) 144/80  Pulse: 76  SpO2: 100%  Weight: 156 lb 12.8 oz (71.1 kg)   Exam General appearance : Awake, alert, sitting at rest in wheelchair. Speech slurred. Not toxic looking.  Emergently attended by myself then Dr Joya Gaskins as well.  '81mg'$  aspirin given at 9:25 am HEENT: Atraumatic and Normocephalic, pupils equally reactive to light and accomodation Neck:  Supple, no JVD. No cervical lymphadenopathy.  Chest: Good air entry bilaterally, CTAB.  No rales/rhonchi/wheezing CVS: S1 S2 regular, no murmurs.  Extremities: B/L Lower Ext shows no edema, both legs are warm to touch Neurology: Awake alert, and oriented X 3, R face with mild droop, R sided weakness and delay with movement.   Skin: No Rash  Data Review Lab Results  Component Value Date   HGBA1C 5.6 11/14/2022   HGBA1C 7.7 (H) 03/05/2022   HGBA1C 7.1 (H) 12/29/2021    Assessment & Plan   1. Right sided weakness 911 called and requested stroke care - aspirin chewable tablet 81 mg  2. Uncontrolled type 2 diabetes mellitus with hyperglycemia (HCC) A1C much improved today - Glucose (CBG) - HgB A1c - glimepiride (AMARYL) 2 MG tablet; Take 1 tablet (2 mg total) by mouth daily with breakfast.  Dispense: 60 tablet; Refill: 2  3. Slurred speech Speech worsened acutely in office - aspirin chewable tablet 81 mg  4. At increased risk for emergency hospital admission Sent out via EMS  5. MDD (major depressive disorder), recurrent severe, without psychosis ([place]) - citalopram (CELEXA) 10 MG tablet; Take 1 tablet (10 mg total) by mouth daily.  Dispense: 60 tablet; Refill: 3  6. Essential hypertension Refills sent bc she needed these - irbesartan (AVAPRO) 75 MG tablet; Take 1 tablet (75 mg total) by mouth daily.  Dispense: 60 tablet; Refill: 2    Return in about 2 months (around 01/14/2023) for PCP for chronic conditions.  The patient was given clear instructions to go to ER or return to medical center if symptoms don't improve, worsen or new problems develop. The patient verbalized understanding. The patient was told to call to get lab results if they haven't heard anything in the next week.      Freeman Caldron, PA-C [facility] and [facility], [place]   11/14/2022, 10:31 AM

## 2022-11-14 NOTE — Consult Note (Addendum)
NEUROLOGY CONSULTATION NOTE   Date of service: November 14, 2022 Patient Name: Stacey Lucas MRN:  [id number] DOB:  [birth year] Reason for consult: "Code stroke" Requesting Provider: Davonna Belling, MD  History of Present Illness  Stacey Lucas is a 47 y.o. female with PMH significant for previous stroke in 2023 (L basal ganglia), anxiety/depression/suicidal ideation, diabetes, hyperlipidemia, hypertension, migraines, cocaine abuse who presents as a code stroke today after arriving to a doctor's appointment this morning and exhibiting increased right-sided weakness and slurred speech.   By the time of her arrival in the ED, speech was slightly slurred and patient stated that her weakness was at her baseline from a previous stroke.  Blood pressure was slightly elevated in the 160s, CBG was within normal limits.  Patient did states she did feel weaker this morning than usual and had to use the seat part of her walker when getting out of the car and into her doctor's appointment.  Patient states she slept most of the day yesterday and may or may not have been weak yesterday she can't tell. Last known well was therefore considered to be yesterday at unknown time. Of note, PA from this morning did note in chart that patient presented with complaints of swelling of face and feet for about 3 weeks and that she was out of medications irbesartan and citalopram.  LKW: Unknown, possibly early yesterday. TNK: No, outside of window. IR?:  No, no LVO rMS: 3, uses walker  NIHSS components Score: Comment  1a Level of Conscious 0'[x]'$  1'[]'$  2'[]'$  3'[]'$      1b LOC Questions 0'[x]'$  1'[]'$  2'[]'$       1c LOC Commands 0'[x]'$  1'[]'$  2'[]'$       2 Best Gaze 0'[x]'$  1'[]'$  2'[]'$       3 Visual 0'[x]'$  1'[]'$  2'[]'$  3'[]'$      4 Facial Palsy 0'[]'$  1'[x]'$  2'[]'$  3'[]'$     L nl fold flattening  5a Motor Arm - left 0'[x]'$  1'[]'$  2'[]'$  3'[]'$  4'[]'$  UN'[]'$    5b Motor Arm - Right 0'[x]'$  1'[]'$  2'[]'$  3'[]'$  4'[]'$  UN'[]'$    6a Motor Leg - Left 0'[x]'$  1'[]'$  2'[]'$  3'[]'$  4'[]'$  UN'[]'$    6b Motor Leg - Right 0'[]'$  1'[x]'$  2'[]'$   3'[]'$  4'[]'$  UN'[]'$    7 Limb Ataxia 0'[x]'$  1'[]'$  2'[]'$  3'[]'$  UN'[]'$     8 Sensory 0'[x]'$  1'[]'$  2'[]'$  UN'[]'$      9 Best Language 0'[x]'$  1'[]'$  2'[]'$  3'[]'$      10 Dysarthria 0'[]'$  1'[x]'$  2'[]'$  UN'[]'$       11 Extinct. and Inattention 0'[x]'$  1'[]'$  2'[]'$       TOTAL:   3         ROS   Constitutional Denies weight loss, fever and chills.   HEENT Denies changes in vision and hearing.   Respiratory Denies SOB and cough.   CV Denies palpitations and CP  GI Denies abdominal pain, nausea, vomiting and diarrhea.   GU Denies dysuria and urinary frequency.   MSK Denies myalgia and joint pain.   Skin Denies rash and pruritus.   Neurological Denies headache and syncope.   Psychiatric Denies recent changes in mood. Denies anxiety and depression.    Past History   Past Medical History:  Diagnosis Date   Anxiety and depression    Cocaine abuse ([place])    Depression with suicidal ideation    [facility] admission 04/2018   Diabetes mellitus without complication (HCC)    Type II   GERD (gastroesophageal reflux disease)    Hyperlipidemia    Hypertension    Impingement syndrome of  right shoulder    Migraine headache    Osteoarthritis of right knee 05/24/2021   Pilonidal abscess 05/2013   Pyelonephritis    Right thyroid nodule    Sciatica    Past Surgical History:  Procedure Laterality Date   BUBBLE STUDY  01/01/2022   Procedure: BUBBLE STUDY;  Surgeon: Janina Mayo, MD;  Location: [facility] ENDOSCOPY;  Service: Cardiovascular;;   TEE WITHOUT CARDIOVERSION N/A 01/01/2022   Procedure: TRANSESOPHAGEAL ECHOCARDIOGRAM (TEE);  Surgeon: Janina Mayo, MD;  Location: [facility] ENDOSCOPY;  Service: Cardiovascular;  Laterality: N/A;   tubal     Family History  Problem Relation Age of Onset   Diabetes Mother    Hypertension Mother    Arthritis Mother    Diabetes Father    Hypertension Father    Social History   Socioeconomic History   Marital status: Single    Spouse name: Not on file   Number of children: 3   Years of education: Not on file   Highest  education level: Not on file  Occupational History   Not on file  Tobacco Use   Smoking status: Former    Packs/day: 1.00    Types: Cigarettes    Quit date: 01/16/2022    Years since quitting: 0.8   Smokeless tobacco: Never  Vaping Use   Vaping Use: Never used  Substance and Sexual Activity   Alcohol use: Yes    Comment: occasionally   Drug use: No   Sexual activity: Yes    Birth control/protection: None  Other Topics Concern   Not on file  Social History Narrative   Lives with children   "lots of caffeine"   Social Determinants of Health   Financial Resource Strain: Not on file  Food Insecurity: Not on file  Transportation Needs: Not on file  Physical Activity: Not on file  Stress: Not on file  Social Connections: Not on file   Allergies  Allergen Reactions   Gabapentin     Weakness/impairs balance    Medications  (Not in a hospital admission)    Vitals   Vitals:   11/14/22 1000 11/14/22 1011  BP:  (!) 163/78  Pulse:  73  Resp:  16  SpO2:  100%  Weight: 72 kg   Height: '5\' 5"'$  (1.651 m)      Body mass index is 26.41 kg/m.  Physical Exam   General: Laying comfortably in bed; in no acute distress.  HENT: Normal oropharynx and mucosa.  Neck: Supple, no pain or tenderness  CV: No JVD. No peripheral edema. Pulmonary: Symmetric Chest rise. Normal respiratory effort.  Abdomen: Soft to touch, non-tender.  Ext: No cyanosis, edema, or deformity  Skin: No rash. Normal palpation of skin.   Musculoskeletal: Normal digits and nails by inspection. No clubbing.   Neurologic Examination  Mental status/Cognition: Alert, oriented to self, place, month and year, good attention.  Speech/language: Dysarthric, comprehension intact, object naming intact, repetition intact. Cranial nerves:   CN II Pupils equal and reactive to light, no VF deficits    CN III,IV,VI EOM intact, no gaze preference or deviation, no nystagmus    CN V normal sensation in V1, V2, and V3  segments bilaterally    CN VII no asymmetry, L nasolabial fold flattening    CN VIII normal hearing to speech    CN IX & X normal palatal elevation, no uvular deviation    CN XI 5/5 head turn and 5/5 shoulder shrug bilaterally  CN XII midline tongue protrusion    Motor:  Drift seen in RLE. Per patient, extremity is weak at baseline.  Generalized weakness 4/5, but no drifts seen in other extremities.   Sensation: Intact and symmetric to light touch throughout  Coordination/Complex Motor:  - Finger to Nose: intact bilaterally - Heel to shin: Intact bilaterally.  Slight ataxia seen on right leg, but not out of proportion to weakness.  - Gait: deferred  Labs   CBC:  Recent Labs  Lab 11/14/22 1013  HGB 9.2*  HCT 27.0*    Basic Metabolic Panel:  Lab Results  Component Value Date   NA 138 11/14/2022   K 4.0 11/14/2022   CO2 20 03/05/2022   GLUCOSE 93 11/14/2022   BUN 37 (H) 11/14/2022   CREATININE 4.90 (H) 11/14/2022   CALCIUM 8.7 03/05/2022   GFRNONAA 32 (L) 01/16/2022   GFRAA >60 04/14/2020   Lipid Panel:  Lab Results  Component Value Date   LDLCALC 120 (H) 03/05/2022   HgbA1c:  Lab Results  Component Value Date   HGBA1C 5.6 11/14/2022   Urine Drug Screen:     Component Value Date/Time   LABOPIA NONE DETECTED 12/30/2021 2138   COCAINSCRNUR POSITIVE (A) 12/30/2021 2138   LABBENZ NONE DETECTED 12/30/2021 2138   AMPHETMU NONE DETECTED 12/30/2021 2138   THCU NONE DETECTED 12/30/2021 2138   LABBARB NONE DETECTED 12/30/2021 2138    Alcohol Level     Component Value Date/Time   ETH <10 05/23/2021 1130    CT Head without contrast(Personally reviewed): No evidence of acute large vascular territory infarct or acute hemorrhage.  ASPECTS is 10. Remote left basal ganglia perforator infarct (location of April 2023 stroke)  MRI Brain(Personally reviewed): pending   Impression   Julee Tuy is a 47 y.o. female with PMH significant for previous stroke,  hypertension, hyperlipidemia, DM, cocaine abuse, anxiety depression/depression/suicidal ideation. Her neurologic examination is notable for mild right sided weakness and slight dysarthria.  Upon assessment, patient states that she does not feel any weaker than normal and didn't think anything was wrong/new until her doctor commented on it during their clinic visit this morning. .  Previous L basal ganglia stroke April 2023 did leave patient with residual right arm weakness.  Primary Diagnosis:  Pending Imaging for r/o stroke Possible acute metabolic encephalopathy secondary to acute kidney injury/increased creatinine (4.9), diabetes, infection (increased WBCs, UA pending)   Recommendations    - Stat MRI  - Stat UA and tox screen  Secondary Stroke Prevention:  - Close good glucose control.  A1c: 7.7. - BP goal normotensive - Continue home ASA, Plavix - Continue crestor '20mg'$    If MRI is negative, no further inpatient neurologic workup indicated. F/u with outpatient neurologist.  If MRI is positive, please notify neurology and admit for stroke workup ______________________________________________________________________   Thank you for the opportunity to take part in the care of this patient. If you have any further questions, please contact the neurology consultation attending.  Note written by Lovey Newcomer NP and edited by MD   Attending Neurohospitalist Addendum Patient seen and examined with APP/Resident. Agree with the history and physical as documented above. Agree with the plan as documented, which I helped formulate. I have edited the note above to reflect my full findings and recommendations. I have independently reviewed the chart, obtained history, review of systems and examined the patient.I have personally reviewed pertinent head/neck/spine imaging (CT/MRI). Please feel free to call with any questions.  -- Jaclyn Shaggy  Quinn Axe, MD [facility] [phone]  If  7pm- 7am, please page neurology on call as listed in [place].  Addendum 1200: MRI (+) for acute stroke. Updated recommendations: - Admit for stroke workup - Permissive HTN x48 hrs from sx onset goal BP <220/110. PRN labetalol or hydralazine if BP above these parameters. Avoid oral antihypertensives. - CTA or MRA H&N - TTE w/ bubble - Check A1c and LDL + add statin per guidelines - ASA '81mg'$  daily + plavix '75mg'$  daily x21 days f/b ASA '81mg'$  daily monotherapy after that - q4 hr neuro checks - STAT head CT for any change in neuro exam - Tele - PT/OT/SLP - Stroke education - Amb referral to neurology upon discharge   Su Monks, MD [facility] [phone]  If [place], please page neurology on call as listed in [place].

## 2022-11-14 NOTE — Code Documentation (Signed)
Stroke Response Nurse Documentation Code Documentation  Stacey Lucas is a 47 y.o. female arriving to [facility]  via [facility] on 3/7 with past medical hx of hypertension, diabetes, hyperlipidemia, previous CVA. On aspirin 325 mg daily and clopidogrel 75 mg daily. Code stroke was activated by EMS.   Patient from [place] office where she was LKW at unknown specific time, but sometime yesterday morning and now complaining of R sided weakness, facial droop, and slurred speech. Patient has residual right sided weakness from previous stroke and uses walker at baseline. Patient is usually able to dress herself but this morning said she was too weak to do so alone and required assistance, though she endorses more weakness since yesterday morning. Patient presented to her doctor's office, where she said she had a previously scheduled appointment and EMS was called. On arrival patient reports no more weakness than her normal baseline.   Stroke team at the bedside on patient arrival. Labs drawn and patient cleared for CT by Dr. Alvino Chapel. Patient to CT with team. NIHSS 3, see documentation for details and code stroke times. Patient with left facial droop, right leg weakness, and dysarthria  on exam. The following imaging was completed:  CT Head. Patient is not a candidate for IV Thrombolytic due to out of window. Patient is not not a candidate for IR due to no LVO suspected per MD.   Care Plan: cancel code stroke, MRI, UA/UDS and if negative possible discharge.   Bedside handoff with ED RN Clifton James.    [place]  Stroke Response RN

## 2022-11-14 NOTE — ED Notes (Signed)
Berta Minor (daughter) would like to be called with a status update on pt. Phone is [phone]

## 2022-11-14 NOTE — ED Notes (Signed)
ED TO INPATIENT HANDOFF REPORT  ED Nurse Name and Phone #: [phone]  S Name/Age/Gender Stacey Lucas 47 y.o. female Room/Bed: H012C/H012C  Code Status   Code Status: Full Code  Home/SNF/Other Home Patient oriented to: self, place, time, and situation Is this baseline? Yes   Triage Complete: Triage complete  Chief Complaint Acute CVA (cerebrovascular accident) [facility]) [I63.9]  Triage Note  Coming from Doctor office, pt arrived by [facility]. Reported not feeling well in the past few days. Today was unable to dress by herself, or ambulate with walker as baseline. Code stroke was called, pt was received by stroke team, Aox4, weakness on right arm and dysarthria.    Allergies Allergies  Allergen Reactions   Gabapentin     Weakness/impairs balance    Level of Care/Admitting Diagnosis ED Disposition     ED Disposition  Admit   Condition  --   Comment  Hospital Area: [place] [[id number]]  Level of Care: Telemetry Medical [104]  May place patient in observation at [facility] or [facility] if equivalent level of care is available:: No  Covid Evaluation: Asymptomatic - no recent exposure (last 10 days) testing not required  Diagnosis: Acute CVA (cerebrovascular accident) [facility]) [id number]  Admitting Physician: Lottie Mussel [id number]  Attending Physician: Lottie Mussel [id number]          B Medical/Surgery History Past Medical History:  Diagnosis Date   Anxiety and depression    Cocaine abuse ([place])    Depression with suicidal ideation    [place] admission 04/2018   Diabetes mellitus without complication (HCC)    Type II   GERD (gastroesophageal reflux disease)    Hyperlipidemia    Hypertension    Impingement syndrome of right shoulder    Migraine headache    Osteoarthritis of right knee 05/24/2021   Pilonidal abscess 05/2013   Pyelonephritis    Right thyroid nodule    Sciatica    Past Surgical History:  Procedure Laterality Date   BUBBLE STUDY   01/01/2022   Procedure: BUBBLE STUDY;  Surgeon: Janina Mayo, MD;  Location: [place];  Service: Cardiovascular;;   TEE WITHOUT CARDIOVERSION N/A 01/01/2022   Procedure: TRANSESOPHAGEAL ECHOCARDIOGRAM (TEE);  Surgeon: Janina Mayo, MD;  Location: [facility] ENDOSCOPY;  Service: Cardiovascular;  Laterality: N/A;   tubal       A IV Location/Drains/Wounds Patient Lines/Drains/Airways Status     Active Line/Drains/Airways     Name Placement date Placement time Site Days   Peripheral IV 11/14/22 18 G Anterior;Proximal;Right Forearm 11/14/22  1020  Forearm  less than 1            Intake/Output Last 24 hours No intake or output data in the 24 hours ending 11/14/22 1411  Labs/Imaging Results for orders placed or performed during the hospital encounter of 11/14/22 (from the past 48 hour(s))  Ethanol     Status: None   Collection Time: 11/14/22 10:03 AM  Result Value Ref Range   Alcohol, Ethyl (B) <10 <10 mg/dL    Comment: (NOTE) Lowest detectable limit for serum alcohol is 10 mg/dL.  For medical purposes only. Performed at [facility], [place] [address], [place], [place] [address]   CBG monitoring, ED     Status: None   Collection Time: 11/14/22 10:03 AM  Result Value Ref Range   Glucose-Capillary 93 70 - 99 mg/dL    Comment: Glucose reference range applies only to samples taken after fasting for at least 8  hours.  I-Stat beta hCG blood, ED     Status: Abnormal   Collection Time: 11/14/22 10:11 AM  Result Value Ref Range   I-stat hCG, quantitative 7.5 (H) <5 mIU/mL   Comment 3            Comment:   GEST. AGE      CONC.  (mIU/mL)   <=1 WEEK        5 - 50     2 WEEKS       50 - 500     3 WEEKS       100 - 10,000     4 WEEKS     1,000 - 30,000        FEMALE AND NON-PREGNANT FEMALE:     LESS THAN 5 mIU/mL   I-stat chem 8, ED     Status: Abnormal   Collection Time: 11/14/22 10:13 AM  Result Value Ref Range   Sodium 138 135 - 145 mmol/L   Potassium 4.0 3.5 - 5.1 mmol/L    Chloride 109 98 - 111 mmol/L   BUN 37 (H) 6 - 20 mg/dL   Creatinine, Ser 4.90 (H) 0.44 - 1.00 mg/dL   Glucose, Bld 93 70 - 99 mg/dL    Comment: Glucose reference range applies only to samples taken after fasting for at least 8 hours.   Calcium, Ion 1.03 (L) 1.15 - 1.40 mmol/L   TCO2 21 (L) 22 - 32 mmol/L   Hemoglobin 9.2 (L) 12.0 - 15.0 g/dL   HCT 27.0 (L) 36.0 - 46.0 %  Protime-INR     Status: None   Collection Time: 11/14/22 10:14 AM  Result Value Ref Range   Prothrombin Time 13.0 11.4 - 15.2 seconds   INR 1.0 0.8 - 1.2    Comment: (NOTE) INR goal varies based on device and disease states. Performed at [facility], [place] [address] [place], [place] [address]   APTT     Status: Abnormal   Collection Time: 11/14/22 10:14 AM  Result Value Ref Range   aPTT 40 (H) 24 - 36 seconds    Comment:        IF BASELINE aPTT IS ELEVATED, SUGGEST PATIENT RISK ASSESSMENT BE USED TO DETERMINE APPROPRIATE ANTICOAGULANT THERAPY. Performed at [facility], [place] [address], [place], [place] [address]   CBC     Status: Abnormal   Collection Time: 11/14/22 10:14 AM  Result Value Ref Range   WBC 16.0 (H) 4.0 - 10.5 K/uL   RBC 3.87 3.87 - 5.11 MIL/uL   Hemoglobin 9.8 (L) 12.0 - 15.0 g/dL   HCT 29.5 (L) 36.0 - 46.0 %   MCV 76.2 (L) 80.0 - 100.0 fL   MCH 25.3 (L) 26.0 - 34.0 pg   MCHC 33.2 30.0 - 36.0 g/dL   RDW 17.1 (H) 11.5 - 15.5 %   Platelets 291 150 - 400 K/uL   nRBC 0.0 0.0 - 0.2 %    Comment: Performed at [facility] [address] [place], [place] [address]  Differential     Status: Abnormal   Collection Time: 11/14/22 10:14 AM  Result Value Ref Range   Neutrophils Relative % 61 %   Neutro Abs 9.7 (H) 1.7 - 7.7 K/uL   Lymphocytes Relative 30 %   Lymphs Abs 4.8 (H) 0.7 - 4.0 K/uL   Monocytes Relative 7 %   Monocytes Absolute 1.1 (H) 0.1 - 1.0 K/uL   Eosinophils  Relative 1 %   Eosinophils Absolute 0.2 0.0 - 0.5 K/uL   Basophils Relative 1 %   Basophils  Absolute 0.1 0.0 - 0.1 K/uL   Immature Granulocytes 0 %   Abs Immature Granulocytes 0.07 0.00 - 0.07 K/uL    Comment: Performed at [facility] [place] [address] [place], [place] [address]  Comprehensive metabolic panel     Status: Abnormal   Collection Time: 11/14/22 10:14 AM  Result Value Ref Range   Sodium 138 135 - 145 mmol/L   Potassium 3.9 3.5 - 5.1 mmol/L   Chloride 109 98 - 111 mmol/L   CO2 17 (L) 22 - 32 mmol/L   Glucose, Bld 103 (H) 70 - 99 mg/dL    Comment: Glucose reference range applies only to samples taken after fasting for at least 8 hours.   BUN 35 (H) 6 - 20 mg/dL   Creatinine, Ser 4.28 (H) 0.44 - 1.00 mg/dL   Calcium 8.1 (L) 8.9 - 10.3 mg/dL   Total Protein 5.0 (L) 6.5 - 8.1 g/dL   Albumin <1.5 (L) 3.5 - 5.0 g/dL   AST 24 15 - 41 U/L   ALT 12 0 - 44 U/L   Alkaline Phosphatase 69 38 - 126 U/L   Total Bilirubin 0.4 0.3 - 1.2 mg/dL   GFR, Estimated 12 (L) >60 mL/min    Comment: (NOTE) Calculated using the CKD-EPI Creatinine Equation (2021)    Anion gap 12 5 - 15    Comment: Performed at [facility], [place] [address] [place],  [address]   MR BRAIN WO CONTRAST  Result Date: 11/14/2022 EXAM: MRI HEAD WITHOUT CONTRAST TECHNIQUE: Multiplanar, multiecho pulse sequences of the brain and surrounding structures were obtained without intravenous contrast. COMPARISON:  Same day CT head. FINDINGS: Brain: Small acute infarct in the left pons. No significant edema or mass effect. No evidence of acute hemorrhage, mass lesion, midline shift or hydrocephalus. Patchy white matter T2/FLAIR hyperintensities, nonspecific but compatible with chronic microvascular ischemic disease. Vascular: Major arterial flow voids are maintained at the skull base. Skull and upper cervical spine: Normal marrow signal. Sinuses/Orbits: Mild paranasal sinus mucosal thickening. Remote left medial orbital wall fracture. Other: No sizable mastoid effusions. IMPRESSION: Small acute infarct in the  left pons. Electronically Signed   By: Margaretha Sheffield M.D.   On: 11/14/2022 11:40   CT HEAD CODE STROKE WO CONTRAST  Result Date: 11/14/2022 CLINICAL DATA:  Code stroke.  Neuro deficit, acute, stroke suspected EXAM: CT HEAD WITHOUT CONTRAST TECHNIQUE: Contiguous axial images were obtained from the base of the skull through the vertex without intravenous contrast. RADIATION DOSE REDUCTION: This exam was performed according to the departmental dose-optimization program which includes automated exposure control, adjustment of the mA and/or kV according to patient size and/or use of iterative reconstruction technique. COMPARISON:  None Available. FINDINGS: Brain: No evidence of acute large vascular territory infarction, hemorrhage, hydrocephalus, extra-axial collection or mass lesion/mass effect. Vascular: No hyperdense vessel identified. Skull: No acute fracture. Sinuses/Orbits: Remote left medial orbital wall fracture. No acute orbital findings. Other: No mastoid effusions. ASPECTS [facility] Stroke Program Early CT Score) total score (0-10 with 10 being normal): 10. IMPRESSION: 1. No evidence of acute large vascular territory infarct or acute hemorrhage. ASPECTS is 10. 2. Remote left basal ganglia perforator infarct. Code stroke imaging results were communicated on 11/14/2022 at 10:30 am to provider stack via secure text paging. Electronically Signed   By: Jamesetta So.D.  On: 11/14/2022 10:30    Pending Labs Unresulted Labs (From admission, onward)     Start     Ordered   11/15/22 0500  Lipid panel  (Labs)  Tomorrow morning,   R       Comments: Fasting    11/14/22 1208   11/14/22 1356  HIV Antibody (routine testing w rflx)  (HIV Antibody (Routine testing w reflex) panel)  Once,   R        11/14/22 1400   11/14/22 1203  Hemoglobin A1c  (Labs)  Once,   URGENT       Comments: To assess prior glycemic control    11/14/22 1208   11/14/22 1015  Urinalysis, w/ Reflex to Culture (Infection  Suspected) -Urine, Unspecified Source  Once,   URGENT       Question:  Specimen Source  Answer:  Urine, Unspecified Source   11/14/22 1015   11/14/22 1015  Urine rapid drug screen (hosp performed)  Once,   STAT        11/14/22 1015            Vitals/Pain Today's Vitals   11/14/22 1245 11/14/22 1300 11/14/22 1315 11/14/22 1330  BP: (!) 148/80 (!) 159/80 (!) 158/70 (!) 150/74  Pulse: 71 69 68 69  Resp: 12     SpO2: 100% 100% 100% 100%  Weight:      Height:        Isolation Precautions No active isolations  Medications Medications   stroke: early stages of recovery book (has no administration in time range)  labetalol (NORMODYNE) injection 10 mg (has no administration in time range)  aspirin EC tablet 81 mg (81 mg Oral Not Given 11/14/22 1245)  clopidogrel (PLAVIX) tablet 75 mg (75 mg Oral Given 11/14/22 1249)  acetaminophen (TYLENOL) tablet 650 mg (has no administration in time range)  enoxaparin (LOVENOX) injection 30 mg (has no administration in time range)  sodium chloride flush (NS) 0.9 % injection 3 mL (3 mLs Intravenous Given 11/14/22 1100)  LORazepam (ATIVAN) injection 1 mg (1 mg Intravenous Given 11/14/22 1037)    Mobility walks with device Walker     Focused Assessments Neuro Assessment Handoff:  Swallow screen pass? Yes    NIH Stroke Scale  Dizziness Present: No Headache Present: No Interval: Other (Comment) (2hour check) Level of Consciousness (1a.)   : Alert, keenly responsive LOC Questions (1b. )   : Answers both questions correctly LOC Commands (1c. )   : Performs both tasks correctly Best Gaze (2. )  : Normal Visual (3. )  : No visual loss Facial Palsy (4. )    : Minor paralysis Motor Arm, Left (5a. )   : No drift Motor Arm, Right (5b. ) : No drift Motor Leg, Left (6a. )  : No drift Motor Leg, Right (6b. ) : Drift Limb Ataxia (7. ): Absent Sensory (8. )  : Normal, no sensory loss Best Language (9. )  : No aphasia Dysarthria (10. ):  Mild-to-moderate dysarthria, patient slurs at least some words and, at worst, can be understood with some difficulty Extinction/Inattention (11.)   : No Abnormality Complete NIHSS TOTAL: 3 Last date known well: 11/13/22 Last time known well:  (patient unsure but states yesterday morning sometime) Neuro Assessment:   Neuro Checks:   Initial (11/14/22 1030)  Has TPA been given? No If patient is a Neuro Trauma and patient is going to OR before floor call report to [place] nurse: [phone] or  [phone]   R Recommendations: See Admitting Provider Note  Report given to:   Additional Notes:

## 2022-11-14 NOTE — ED Triage Notes (Signed)
Coming from Doctor office, pt arrived by [facility]. Reported not feeling well in the past few days. Today was unable to dress by herself, or ambulate with walker as baseline. Code stroke was called, pt was received by stroke team, Aox4, weakness on right arm and dysarthria.

## 2022-11-14 NOTE — H&P (Addendum)
Date: 11/14/2022               Patient Name:  Stacey Lucas MRN: [id number]  DOB: [birth year] Age / Sex: 47 y.o., female   PCP: Elsie Stain, MD         Medical Service: Internal Medicine Teaching Service         Attending Physician: Dr. Lottie Mussel, MD      First Contact: Dr. Gaylyn Rong, MD Pager [phone]    Second Contact: Dr. Madelyn Flavors, MD Pager [phone]         After Hours (After 5p/  First Contact Pager: [phone]  weekends / holidays): Second Contact Pager: [phone]   SUBJECTIVE   Chief Complaint: left facial droop, right extremity weakness  History of Present Illness:   Stacey Lucas is a 47 y/o person living with a history of prior CVA with residual deficit of right sided hemiparesis, HTN, HLD, depression, T2DM, polysubstance use, and chronic low back pain who presented with new right sided weakness, dysarthria, and left facial droop concerning for a CVA  She was in her usual state of health when she woke up with slurred speech and right sided weakness. She had trouble putting on her clothes and talking. She presented to her PCP office and they sent her to the ED for further work up. When asked again, she does not remember when these symptoms started. She denies associated difficulty swallowing or changes in her vision. Denies left sided extremity deficit. Has new numbness in her lower extremities that started this past week. She notes a history of lower extremity numbness that has come and gone. She has had two prior strokes in the last two years.   Denies fever, chills, cough chest pain. Denies nausae, vomiting, diarrhea. Denies difficulty urinating. Denies vomiting or diarrhea. She endorses adherence to her medications, specifically her plavix and aspirin. She has been out of her citalopram, irbesartan and crestor.   Somewhat limited exam, patient somnolent after ativan dosing given prior to MRI  Meds:  Aspirin 81 mg daily Plavix 75 mg daily Wellbutrin 150 mg  daily Amlodipine 10 mg daily Optivar eye drops BID Glargine 5 U daily and SSI Metoprolol (denies taking) Crestor 20 mg  Celexa 10 mg daily Glimepririe (denies taking)  Irbesartan 75 mg daily  Past Medical History HTN HLD Type 2 DM Prior CVA Depression Chronic low back pain Substance use  Past Surgical History:  Procedure Laterality Date   BUBBLE STUDY  01/01/2022   Procedure: BUBBLE STUDY;  Surgeon: Janina Mayo, MD;  Location: [place];  Service: Cardiovascular;;   TEE WITHOUT CARDIOVERSION N/A 01/01/2022   Procedure: TRANSESOPHAGEAL ECHOCARDIOGRAM (TEE);  Surgeon: Janina Mayo, MD;  Location: [place];  Service: Cardiovascular;  Laterality: N/A;   tubal     Social:  Lives with her boyfriend Occupation: Retired Support: Denies having good support system Level of Function: Rollator to help with ADL/iADL PCP: Asencion Noble MD Substances: 1/2 ppd 30 years, occasional alcohol use. Snorts cocaine weekly, last used two days ago.   Family History  Problem Relation Age of Onset   Diabetes Mother    Hypertension Mother    Arthritis Mother    Diabetes Father    Hypertension Father    Allergies: Allergies as of 11/14/2022 - Review Complete 11/14/2022  Allergen Reaction Noted   Gabapentin  01/16/2022   Review of Systems: A complete ROS was negative except as per HPI.   OBJECTIVE:   Physical Exam: Blood  pressure (!) 150/74, pulse 69, resp. rate 12, height '5\' 5"'$  (1.651 m), weight 72 kg, last menstrual period 10/11/2019, SpO2 100 %.  Constitutional: no acute distress HENT: normocephalic atraumatic, mucous membranes dry Eyes: conjunctiva non-erythematous. left Neck: supple Cardiovascular: regular rate and rhythm, no murmur,gallop rub. Pulmonary/Chest: normal work of breathing on room air, lungs clear to auscultation bilaterally Abdominal: soft, non-tender, non-distended MSK: normal bulk and tone Neurological: alert & oriented x 3, left sided facial droop.  PERRL. No nystagmus. No uvular deviation or tongue fasciculations. RUE and RLE strength 4/5. LUE and LLE strength 5/5. Sensation intact. Dysmetria RUE.  Abnormal heel to shin RLE. Dysarthria on exam. Skin: warm and dry Psych: tearful  Labs: CBC    Latest Ref Rng & Units 11/14/2022   10:14 AM 11/14/2022   10:13 AM 03/05/2022   10:45 AM  CBC  WBC 4.0 - 10.5 K/uL 16.0   10.9   Hemoglobin 12.0 - 15.0 g/dL 9.8  9.2  10.9   Hematocrit 36.0 - 46.0 % 29.5  27.0  32.6   Platelets 150 - 400 K/uL 291   287    CMP     Latest Ref Rng & Units 11/14/2022   10:14 AM 11/14/2022   10:13 AM 03/05/2022   10:45 AM  CMP  Glucose 70 - 99 mg/dL 103  93  204   BUN 6 - 20 mg/dL 35  37  21   Creatinine 0.44 - 1.00 mg/dL 4.28  4.90  1.68   Sodium 135 - 145 mmol/L 138  138  143   Potassium 3.5 - 5.1 mmol/L 3.9  4.0  4.1   Chloride 98 - 111 mmol/L 109  109  108   CO2 22 - 32 mmol/L 17   20   Calcium 8.9 - 10.3 mg/dL 8.1   8.7   Total Protein 6.5 - 8.1 g/dL 5.0   5.3   Total Bilirubin 0.3 - 1.2 mg/dL 0.4   <0.2   Alkaline Phos 38 - 126 U/L 69   121   AST 15 - 41 U/L 24   25   ALT 0 - 44 U/L 12   19    Imaging:  Chest xray No acute cardiopulmonary process  US Renal Increased cortical echogenicity bilaterally suggestive of medical renal disease. No hydronephrosis.  MRI brain w/o C Small acute infarct in the left pons.  CT w/o c 1. No evidence of acute large vascular territory infarct or acute hemorrhage. ASPECTS is 10. 2. Remote left basal ganglia perforator infarct.   EKG: personally reviewed my interpretation is normal sinus rhythm with a rate of 75 bpm w/ LAD. LVH. Unchanged from prior EKG 12/29/21  ASSESSMENT & PLAN:   Assessment & Plan by Problem: Active Problems:   Acute CVA (cerebrovascular accident) ([place])  Stacey Lucas is a 47 y.o. person living with a history of prior CVA with residual deficit of right sided hemiparesis, HTN, HLD, depression, T2DM, polysubstance use, and chronic low back  pain  who presented with left facial droop, dysarthria, and right sided weakness and admitted for acute CVA on hospital day 0  Acute CVA Hx of prior CVA 2023 L basal ganglia infarct Prior CVA in 2023 with MRI brain acute infarct in left corona radiata, basal ganglia, and chronic small vessel disease, she did receive TNK. Cardio embolic work up was negative. 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 She has residual deficits of right sided hemiparesis. She was evaluated  by cardiology in 2023 for atrial fibrillation, on EKG review there was one incidence of atrial fibrillation. The plan was to have a cardiac monitor placed but unclear if this was ever done.   Presents today with left sided facial droop and dysarthria that onset this morning, unclear of last known normal. Per patient right sided weakness at her baseline. MRI imaging consistent with acute infarct of left pons. She endorses adherence to her aspirin, plavix, and statin therapies. Last A1c of 7.7%, repeat pending. Unclear if plavix/aspirin breakthrough CVA and will need alternative agents or she is having paroxysmal atrial fibrillation and will need anticoagulation. Other potential etiology is her continued cocaine use. Further work up with echocardiogram and PT/OT/SLP evaluation. Continue management of HTN, HLD, and T2DM.  -appreciate neurology, SLP, PT, OT assistance -aspirin 81 mg and plavix 75 mg daily -cardiac monitor to assess for arrhythmia -manage HTN, HLD as per below -echocardiogram pending, TTE changes can also indiciate underlying arrythmia.  -q4h neuro checks -A1c and LDL pending -continue substance cessation  Acute kidney Injury on CKD 3a/b Non-anion gap metabolic acidosis Baseline Cr of 1.6-1.9, GFR range from 40-50. On admission Cr of 4.28 with GFR of 12.   Unclear etiology of acute AKI. Renal US with evidence of medically renal disease. She denies being on the ground for an extended period of  time. Denies any dysuria, vaginal discharge, or suprapubic abdominal pain. Denies recent illness. CK mildly elevated. While UA with pyuria and and bacteriuria she is asymptomatic. Will give fluids for a few hours and check urine studies as well, work up for multiple myeloma as per below. -LR infusion 100 cc for 8 hours -MM work up pending -urine creatinine and sodium pending -will need outpatient nephrology follow up   Protein Gap Patient with significant proteinuria over 300 since 2018 and multiple visits with protein gap with albumin below 1.5 and protein of 5. Does not appear this has been worked up. Persistent protein gap along anemia, high normal calcium (corrected to over 10.0), and proteinuria concerning for malignancy.  -SPEP, UPEP, and IE pending  Microcytic anemia Hgb of 9.8, MCV of 76.2. Has been with microcytic anemia, do not see prior iron studies. No evidence of acute GI bleed. No history of colonoscopy -iron studies and ferritin -blood smear and reticulocyte count -multiple myeloma work up pending  Asymptomatic pyuria Patient denies suprapubic pain, dysuria, or any other urinary symptoms. UA with pyuria and bacteruria. With her being asymptotic do not believe etiology of her leukocytosis or renal dysfunction -continue to monitor for UTI symptoms -UA did reflex to culture, can follow up if need or becomes symptomatic  Leukocytosis On presentation WBC of 16.0, predominant left shift. Denies fever, chills, cough, shortness of breath or urinary symptoms. She denies syncope. Unclear etiology of this. Xray without focal opacity.  -follow up smear -repeat CBC tomorrow -continue to monitor for infectious etiologies  Polysubstance use Endorses snorting cocaine weekly. She became tearful when discussing this. We discussed how this can lead to further strokes and even heart attacks. -encourage cessation -TOC consult  Hx of T2DM Home regimen of glargine 5 U and glimepiride which  she states she isn't taking.  -SSI during hospitalization   HTN -restart home amlodipine 10 mg tomorrow. Hold on irbesartan with AKI. She denies taking metoprolol, can restart if persistently hypertensive  HLD -renally dose rosuvastatin to 10 mg daily  Depression -restart home celexa and wellbutrin  Allergies -Hx of allergic conjunctivitis, will continue eye drops during  admission  Diet: Normal VTE: Enoxaparin IVF: LR,100cc/hr for 8 hours Code: Full  Prior to Admission Living Arrangement: Home, living boyfriend Anticipated Discharge Location: Home Barriers to Discharge: further work up for her CVA  Dispo: Admit patient to Observation with expected length of stay less than 2 midnights.  Signed: Riesa Pope, MD Internal Medicine Resident PGY-3  11/14/2022, 6:58 PM

## 2022-11-14 NOTE — ED Notes (Signed)
Patient transported to MRI 

## 2022-11-14 NOTE — Evaluation (Signed)
Physical Therapy Evaluation Patient Details Name: Stacey Lucas MRN: [id number] DOB: [birth year] Today's Date: 11/14/2022  History of Present Illness  47 yo female presents to [facility] on 3/7 with R weakness, slurred speech. MRI brain shows Small acute infarct in the left pons. PMH includes PMH significant for previous stroke in 2023 (L basal ganglia), anxiety/depression/suicidal ideation, diabetes, hyperlipidemia, hypertension, migraines, cocaine abuse.  Clinical Impression   Pt presents with R sided weakness, impaired balance, impaired gait, and decreased acitivty tolerance. Pt to benefit from acute PT to address deficits. Pt ambulated short distance with RW at bedside, limited in progression given fatigue and RLE weakness. Pt overall mobilizing at min assist level, states her boyfriend can help her at d/c. PT to progress mobility as tolerated, and will continue to follow acutely.         Recommendations for follow up therapy are one component of a multi-disciplinary discharge planning process, led by the attending physician.  Recommendations may be updated based on patient status, additional functional criteria and insurance authorization.  Follow Up Recommendations Home health PT      Assistance Recommended at Discharge Frequent or constant Supervision/Assistance  Patient can return home with the following  A little help with walking and/or transfers;A little help with bathing/dressing/bathroom    Equipment Recommendations Other (comment) (rollator needs a new part, vs needs a new rollator)  Recommendations for Other Services       Functional Status Assessment Patient has had a recent decline in their functional status and demonstrates the ability to make significant improvements in function in a reasonable and predictable amount of time.     Precautions / Restrictions Precautions Precautions: Fall Restrictions Weight Bearing Restrictions: No      Mobility  Bed Mobility Overal bed  mobility: Needs Assistance Bed Mobility: Supine to Sit, Sit to Supine     Supine to sit: Min assist, HOB elevated Sit to supine: Min assist, HOB elevated   General bed mobility comments: assist for trunk and LE management    Transfers Overall transfer level: Needs assistance Equipment used: Rolling walker (2 wheels) Transfers: Sit to/from Stand Sit to Stand: Min assist           General transfer comment: assist for initial power up and steadying once standing especially with progression of hands to RW.    Ambulation/Gait Ambulation/Gait assistance: Min assist Gait Distance (Feet): 3 Feet Assistive device: Rolling walker (2 wheels) Gait Pattern/deviations: Step-to pattern, Decreased step length - right, Decreased dorsiflexion - right, Trunk flexed, Knee hyperextension - right Gait velocity: decr     General Gait Details: assist to steady, cues for upright posture  Stairs            Wheelchair Mobility    Modified Rankin (Stroke Patients Only) Modified Rankin (Stroke Patients Only) Pre-Morbid Rankin Score: Slight disability Modified Rankin: Moderately severe disability     Balance Overall balance assessment: Needs assistance Sitting-balance support: No upper extremity supported, Feet supported Sitting balance-Leahy Scale: Fair     Standing balance support: During functional activity, Bilateral upper extremity supported, Reliant on assistive device for balance Standing balance-Leahy Scale: Poor                               Pertinent Vitals/Pain Pain Assessment Pain Assessment: Faces Faces Pain Scale: Hurts little more Pain Location: back, legs Pain Descriptors / Indicators: Sore Pain Intervention(s): Limited activity within patient's tolerance, Monitored during session, Repositioned  Home Living Family/patient expects to be discharged to:: Private residence Living Arrangements: Spouse/significant other Available Help at Discharge:  Family Type of Home: Apartment Home Access: Level entry       Home Layout: One level Home Equipment: Rollator (4 wheels)      Prior Function Prior Level of Function : Needs assist             Mobility Comments: pt using rollator x1 years ADLs Comments: pt reports her boyfriend helps her dress as needed     Hand Dominance   Dominant Hand: Right    Extremity/Trunk Assessment   Upper Extremity Assessment Upper Extremity Assessment: Defer to OT evaluation    Lower Extremity Assessment Lower Extremity Assessment: Generalized weakness;RLE deficits/detail RLE Deficits / Details: 2+/5 hip flexion, 3/5 hip abd/add, 3+/5 knee extension, 3/5 knee flexion, 2/5 DF functionally    Cervical / Trunk Assessment Cervical / Trunk Assessment: Normal  Communication   Communication: Expressive difficulties  Cognition Arousal/Alertness: Awake/alert Behavior During Therapy: WFL for tasks assessed/performed Overall Cognitive Status: Impaired/Different from baseline Area of Impairment: Problem solving                             Problem Solving: Slow processing, Decreased initiation, Difficulty sequencing, Requires verbal cues, Requires tactile cues          General Comments      Exercises     Assessment/Plan    PT Assessment Patient needs continued PT services  PT Problem List Decreased strength;Decreased mobility;Decreased activity tolerance;Decreased balance;Decreased knowledge of use of DME;Pain;Decreased safety awareness       PT Treatment Interventions DME instruction;Therapeutic activities;Gait training;Therapeutic exercise;Patient/family education;Balance training;Functional mobility training;Neuromuscular re-education    PT Goals (Current goals can be found in the Care Plan section)  Acute Rehab PT Goals PT Goal Formulation: With patient Time For Goal Achievement: 11/28/22 Potential to Achieve Goals: Good    Frequency Min 4X/week      Co-evaluation               AM-PAC PT "6 Clicks" Mobility  Outcome Measure Help needed turning from your back to your side while in a flat bed without using bedrails?: A Little Help needed moving from lying on your back to sitting on the side of a flat bed without using bedrails?: A Little Help needed moving to and from a bed to a chair (including a wheelchair)?: A Little Help needed standing up from a chair using your arms (e.g., wheelchair or bedside chair)?: A Little Help needed to walk in hospital room?: A Lot Help needed climbing 3-5 steps with a railing? : A Lot 6 Click Score: 16    End of Session   Activity Tolerance: Patient tolerated treatment well;Patient limited by fatigue Patient left: in bed;with call bell/phone within reach;with bed alarm set Nurse Communication: Mobility status PT Visit Diagnosis: Other abnormalities of gait and mobility (R26.89);Muscle weakness (generalized) (M62.81)    Time: [id number] PT Time Calculation (min) (ACUTE ONLY): 17 min   Charges:   PT Evaluation $PT Eval Low Complexity: 1 Low          Brielynn Sekula S, PT [name] Acute Rehabilitation Services Pager [phone]  Office [phone]   [place] 11/14/2022, 5:30 PM

## 2022-11-14 NOTE — Plan of Care (Signed)
  Problem: Education: Goal: Knowledge of disease or condition will improve Outcome: Progressing   Problem: Coping: Goal: Will verbalize positive feelings about self Outcome: Progressing   Problem: Health Behavior/Discharge Planning: Goal: Ability to manage health-related needs will improve Outcome: Progressing   Problem: Nutrition: Goal: Dietary intake will improve Outcome: Progressing   Problem: Education: Goal: Knowledge of General Education information will improve Description: Including pain rating scale, medication(s)/side effects and non-pharmacologic comfort measures Outcome: Progressing   Problem: Nutrition: Goal: Adequate nutrition will be maintained Outcome: Progressing

## 2022-11-14 NOTE — Progress Notes (Signed)
Patient received from ED at 1520, vital sigs stable.

## 2022-11-14 NOTE — ED Provider Notes (Signed)
[facility] Provider Note   CSN: [id number] Arrival date & time: 11/14/22  1001  An emergency department physician performed an initial assessment on this suspected stroke patient at 67.  History  Chief Complaint  Patient presents with   Code Stroke    Stacey Lucas is a 47 y.o. female.  HPI Patient presented as a code stroke.  Came in reportedly with right-sided weakness.  Walks with a walker at baseline but was having difficulty doing that today.  Had a visit at PCP came in here.  Did have a stroke around a year ago.  Denies fevers.  States she normally uses the walker as a walker but had to use the seat part of it today.  Had to have help with dressing also.  Does have chronic right-sided weakness.   Past Medical History:  Diagnosis Date   Anxiety and depression    Cocaine abuse ([place])    Depression with suicidal ideation    [facility] admission 04/2018   Diabetes mellitus without complication (HCC)    Type II   GERD (gastroesophageal reflux disease)    Hyperlipidemia    Hypertension    Impingement syndrome of right shoulder    Migraine headache    Osteoarthritis of right knee 05/24/2021   Pilonidal abscess 05/2013   Pyelonephritis    Right thyroid nodule    Sciatica     Home Medications Prior to Admission medications   Medication Sig Start Date End Date Taking? Authorizing Provider  ACCU-CHEK GUIDE test strip USE UP TO FOUR TIMES DAILY AS DIRECTED 01/31/22   [provider]  Accu-Chek Softclix Lancets lancets SMARTSIG:Topical 1-4 Times Daily 01/31/22   [provider]  amLODipine (NORVASC) 10 MG tablet Take 1 tablet (10 mg total) by mouth daily. 04/18/22   Elsie Stain, MD  aspirin EC 81 MG tablet Take 1 tablet (81 mg total) by mouth daily. Swallow whole. 04/18/22   Elsie Stain, MD  azelastine (OPTIVAR) 0.05 % ophthalmic solution Place 1 drop into both eyes 2 (two) times daily. 08/21/22   Brunetta Jeans, PA-C  blood glucose meter kit and supplies KIT Dispense based on patient and insurance preference. Use up to four times daily as directed. 01/31/22   [name], MD  buPROPion (WELLBUTRIN XL) 150 MG 24 hr tablet TAKE 1 TABLET(150 MG) BY MOUTH DAILY 04/18/22   Elsie Stain, MD  citalopram (CELEXA) 10 MG tablet Take 1 tablet (10 mg total) by mouth daily. 11/14/22   Argentina Donovan, PA-C  clopidogrel (PLAVIX) 75 MG tablet TAKE 1 TABLET(75 MG) BY MOUTH DAILY 05/15/22   Elsie Stain, MD  diclofenac Sodium (VOLTAREN) 1 % GEL Apply 4 g topically daily as needed (For knee pain). 04/18/22   Elsie Stain, MD  glimepiride (AMARYL) 2 MG tablet Take 1 tablet (2 mg total) by mouth daily with breakfast. 11/14/22   Argentina Donovan, PA-C  Insulin Glargine (BASAGLAR KWIKPEN) 100 UNIT/ML Inject 5 Units into the skin daily. 04/18/22   Elsie Stain, MD  Insulin Pen Needle 32G X 4 MM MISC Use to inject insulin at bed time 04/18/22   Elsie Stain, MD  irbesartan (AVAPRO) 75 MG tablet Take 1 tablet (75 mg total) by mouth daily. 11/14/22   Argentina Donovan, PA-C  meloxicam (MOBIC) 15 MG tablet TAKE 1 TABLET(15 MG) BY MOUTH DAILY 07/01/22   Elsie Stain, MD  metoprolol succinate (TOPROL-XL) 25  MG 24 hr tablet TAKE 3 TABLETS(75 MG) BY MOUTH DAILY 08/16/22   Elsie Stain, MD  rosuvastatin (CRESTOR) 20 MG tablet TAKE 1 TABLET(20 MG) BY MOUTH AT BEDTIME 11/05/22   Elsie Stain, MD      Allergies    Gabapentin    Review of Systems   Review of Systems  Physical Exam Updated Vital Signs BP (!) 141/88   Pulse 75   Resp 16   Ht '5\' 5"'$  (1.651 m)   Wt 72 kg   LMP 10/11/2019   SpO2 100%   BMI 26.41 kg/m  Physical Exam Vitals and nursing note reviewed.  Cardiovascular:     Rate and Rhythm: Regular rhythm.  Abdominal:     Tenderness: There is no abdominal tenderness.  Musculoskeletal:        General: No tenderness.  Neurological:     Mental Status: She is alert and oriented to  person, place, and time.     Comments: Complete NIH scoring done by neurology.  Some left-sided facial droop and right-sided weakness.     ED Results / Procedures / Treatments   Labs (all labs ordered are listed, but only abnormal results are displayed) Labs Reviewed  APTT - Abnormal; Notable for the following components:      Result Value   aPTT 40 (*)    All other components within normal limits  CBC - Abnormal; Notable for the following components:   WBC 16.0 (*)    Hemoglobin 9.8 (*)    HCT 29.5 (*)    MCV 76.2 (*)    MCH 25.3 (*)    RDW 17.1 (*)    All other components within normal limits  DIFFERENTIAL - Abnormal; Notable for the following components:   Neutro Abs 9.7 (*)    Lymphs Abs 4.8 (*)    Monocytes Absolute 1.1 (*)    All other components within normal limits  COMPREHENSIVE METABOLIC PANEL - Abnormal; Notable for the following components:   CO2 17 (*)    Glucose, Bld 103 (*)    BUN 35 (*)    Creatinine, Ser 4.28 (*)    Calcium 8.1 (*)    Total Protein 5.0 (*)    Albumin <1.5 (*)    GFR, Estimated 12 (*)    All other components within normal limits  I-STAT CHEM 8, ED - Abnormal; Notable for the following components:   BUN 37 (*)    Creatinine, Ser 4.90 (*)    Calcium, Ion 1.03 (*)    TCO2 21 (*)    Hemoglobin 9.2 (*)    HCT 27.0 (*)    All other components within normal limits  I-STAT BETA HCG BLOOD, ED ([facility], [facility], [facility] ONLY) - Abnormal; Notable for the following components:   I-stat hCG, quantitative 7.5 (*)    All other components within normal limits  PROTIME-INR  ETHANOL  URINALYSIS, W/ REFLEX TO CULTURE (INFECTION SUSPECTED)  RAPID URINE DRUG SCREEN, HOSP PERFORMED  HEMOGLOBIN A1C  CBG MONITORING, ED    EKG None  Radiology MR BRAIN WO CONTRAST  Result Date: 11/14/2022 EXAM: MRI HEAD WITHOUT CONTRAST TECHNIQUE: Multiplanar, multiecho pulse sequences of the brain and surrounding structures were obtained without intravenous contrast. COMPARISON:   Same day CT head. FINDINGS: Brain: Small acute infarct in the left pons. No significant edema or mass effect. No evidence of acute hemorrhage, mass lesion, midline shift or hydrocephalus. Patchy white matter T2/FLAIR hyperintensities, nonspecific but compatible with chronic microvascular ischemic disease.  Vascular: Major arterial flow voids are maintained at the skull base. Skull and upper cervical spine: Normal marrow signal. Sinuses/Orbits: Mild paranasal sinus mucosal thickening. Remote left medial orbital wall fracture. Other: No sizable mastoid effusions. IMPRESSION: Small acute infarct in the left pons. Electronically Signed   By: Margaretha Sheffield M.D.   On: 11/14/2022 11:40   CT HEAD CODE STROKE WO CONTRAST  Result Date: 11/14/2022 CLINICAL DATA:  Code stroke.  Neuro deficit, acute, stroke suspected EXAM: CT HEAD WITHOUT CONTRAST TECHNIQUE: Contiguous axial images were obtained from the base of the skull through the vertex without intravenous contrast. RADIATION DOSE REDUCTION: This exam was performed according to the departmental dose-optimization program which includes automated exposure control, adjustment of the mA and/or kV according to patient size and/or use of iterative reconstruction technique. COMPARISON:  None Available. FINDINGS: Brain: No evidence of acute large vascular territory infarction, hemorrhage, hydrocephalus, extra-axial collection or mass lesion/mass effect. Vascular: No hyperdense vessel identified. Skull: No acute fracture. Sinuses/Orbits: Remote left medial orbital wall fracture. No acute orbital findings. Other: No mastoid effusions. ASPECTS [facility] Stroke Program Early CT Score) total score (0-10 with 10 being normal): 10. IMPRESSION: 1. No evidence of acute large vascular territory infarct or acute hemorrhage. ASPECTS is 10. 2. Remote left basal ganglia perforator infarct. Code stroke imaging results were communicated on 11/14/2022 at 10:30 am to provider stack via secure text  paging. Electronically Signed   By: Margaretha Sheffield M.D.   On: 11/14/2022 10:30    Procedures Procedures    Medications Ordered in ED Medications  sodium chloride flush (NS) 0.9 % injection 3 mL (has no administration in time range)   stroke: early stages of recovery book (has no administration in time range)  labetalol (NORMODYNE) injection 10 mg (has no administration in time range)  aspirin EC tablet 81 mg (has no administration in time range)  clopidogrel (PLAVIX) tablet 75 mg (has no administration in time range)  acetaminophen (TYLENOL) tablet 650 mg (has no administration in time range)  LORazepam (ATIVAN) injection 1 mg (1 mg Intravenous Given 11/14/22 1037)    ED Course/ Medical Decision Making/ A&P                             Medical Decision Making Amount and/or Complexity of Data Reviewed Labs: ordered. Radiology: ordered.  Risk Prescription drug management.   Patient came in as a code stroke.  Last normal however may have been yesterday but not a TNK candidate.  Met at bridge by my self and Dr. Quinn Axe.  Head CT done and reassuring.  No bleed.  Plan was for MRI.  However does have an acute kidney injury.  Creatinine 1.68 months ago and now up to 4.9.  Will likely require admission to the hospital for this as this could be the cause of the acute weakness also.  MRI did show acute stroke.  Discussed with Dr. Quinn Axe again and will admit for stroke workup.  Will also get urinalysis and postvoid residual to evaluate for the kidney disease.  Admit to unassigned medicine.  CRITICAL CARE Performed by: Davonna Belling Total critical care time: 30 minutes Critical care time was exclusive of separately billable procedures and treating other patients. Critical care was necessary to treat or prevent imminent or life-threatening deterioration. Critical care was time spent personally by me on the following activities: development of treatment plan with patient and/or surrogate as  well as nursing, discussions with  consultants, evaluation of patient's response to treatment, examination of patient, obtaining history from patient or surrogate, ordering and performing treatments and interventions, ordering and review of laboratory studies, ordering and review of radiographic studies, pulse oximetry and re-evaluation of patient's condition.           Final Clinical Impression(s) / ED Diagnoses Final diagnoses:  AKI (acute kidney injury) ([place])  Cerebrovascular accident (CVA), unspecified mechanism [place])    Rx / DC Orders ED Discharge Orders     None         Davonna Belling, MD 11/14/22 1209

## 2022-11-15 ENCOUNTER — Observation Stay (HOSPITAL_COMMUNITY): Payer: BC Managed Care – PPO

## 2022-11-15 DIAGNOSIS — I6389 Other cerebral infarction: Secondary | ICD-10-CM

## 2022-11-15 DIAGNOSIS — N179 Acute kidney failure, unspecified: Secondary | ICD-10-CM | POA: Diagnosis not present

## 2022-11-15 DIAGNOSIS — N049 Nephrotic syndrome with unspecified morphologic changes: Secondary | ICD-10-CM | POA: Diagnosis not present

## 2022-11-15 DIAGNOSIS — I639 Cerebral infarction, unspecified: Secondary | ICD-10-CM

## 2022-11-15 LAB — BASIC METABOLIC PANEL
Anion gap: 7 (ref 5–15)
BUN: 40 mg/dL — ABNORMAL HIGH (ref 6–20)
CO2: 22 mmol/L (ref 22–32)
Calcium: 7.6 mg/dL — ABNORMAL LOW (ref 8.9–10.3)
Chloride: 110 mmol/L (ref 98–111)
Creatinine, Ser: 4.36 mg/dL — ABNORMAL HIGH (ref 0.44–1.00)
GFR, Estimated: 12 mL/min — ABNORMAL LOW (ref >60)
Glucose, Bld: 144 mg/dL — ABNORMAL HIGH (ref 70–99)
Potassium: 3.4 mmol/L — ABNORMAL LOW (ref 3.5–5.1)
Sodium: 139 mmol/L (ref 135–145)

## 2022-11-15 LAB — CBC WITH DIFFERENTIAL/PLATELET
Abs Immature Granulocytes: 0.04 10*3/uL (ref 0.00–0.07)
Basophils Absolute: 0.1 10*3/uL (ref 0.0–0.1)
Basophils Relative: 1 %
Eosinophils Absolute: 0.2 10*3/uL (ref 0.0–0.5)
Eosinophils Relative: 2 %
HCT: 22.5 % — ABNORMAL LOW (ref 36.0–46.0)
Hemoglobin: 7.7 g/dL — ABNORMAL LOW (ref 12.0–15.0)
Immature Granulocytes: 0 %
Lymphocytes Relative: 31 %
Lymphs Abs: 3.1 10*3/uL (ref 0.7–4.0)
MCH: 25.3 pg — ABNORMAL LOW (ref 26.0–34.0)
MCHC: 34.2 g/dL (ref 30.0–36.0)
MCV: 74 fL — ABNORMAL LOW (ref 80.0–100.0)
Monocytes Absolute: 1.1 10*3/uL — ABNORMAL HIGH (ref 0.1–1.0)
Monocytes Relative: 11 %
Neutro Abs: 5.3 10*3/uL (ref 1.7–7.7)
Neutrophils Relative %: 55 %
Platelets: 228 10*3/uL (ref 150–400)
RBC: 3.04 MIL/uL — ABNORMAL LOW (ref 3.87–5.11)
RDW: 16.6 % — ABNORMAL HIGH (ref 11.5–15.5)
WBC: 9.8 10*3/uL (ref 4.0–10.5)
nRBC: 0 % (ref 0.0–0.2)

## 2022-11-15 LAB — ECHOCARDIOGRAM COMPLETE
Area-P 1/2: 4.33 cm2
Calc EF: 64.2 %
Height: 65 in
S' Lateral: 2.5 cm
Single Plane A2C EF: 66.4 %
Single Plane A4C EF: 59 %
Weight: 2539.7 oz

## 2022-11-15 LAB — HEPATITIS B SURFACE ANTIBODY,QUALITATIVE: Hep B S Ab: REACTIVE — AB

## 2022-11-15 LAB — HEMOGLOBIN AND HEMATOCRIT, BLOOD
HCT: 23.8 % — ABNORMAL LOW (ref 36.0–46.0)
Hemoglobin: 8 g/dL — ABNORMAL LOW (ref 12.0–15.0)

## 2022-11-15 LAB — HEPATITIS C ANTIBODY: HCV Ab: NONREACTIVE

## 2022-11-15 LAB — GLUCOSE, CAPILLARY
Glucose-Capillary: 149 mg/dL — ABNORMAL HIGH (ref 70–99)
Glucose-Capillary: 183 mg/dL — ABNORMAL HIGH (ref 70–99)
Glucose-Capillary: 215 mg/dL — ABNORMAL HIGH (ref 70–99)
Glucose-Capillary: 210 mg/dL — ABNORMAL HIGH (ref 70–99)

## 2022-11-15 LAB — HIV ANTIBODY (ROUTINE TESTING W REFLEX): HIV Screen 4th Generation wRfx: NONREACTIVE

## 2022-11-15 LAB — HEMOGLOBIN A1C
Hgb A1c MFr Bld: 5.6 % (ref 4.8–5.6)
Mean Plasma Glucose: 114 mg/dL

## 2022-11-15 LAB — PROTEIN, URINE, 24 HOUR
Collection Interval-UPROT: 24 hours
Protein, 24H Urine: 6110 mg/d — ABNORMAL HIGH (ref 50–100)
Protein, Urine: 1222 mg/dL
Urine Total Volume-UPROT: 500 mL

## 2022-11-15 LAB — LIPID PANEL
Cholesterol: 190 mg/dL (ref 0–200)
HDL: 50 mg/dL (ref >40)
LDL Cholesterol: 99 mg/dL (ref 0–99)
Total CHOL/HDL Ratio: 3.8 RATIO
Triglycerides: 205 mg/dL — ABNORMAL HIGH (ref <150)
VLDL: 41 mg/dL — ABNORMAL HIGH (ref 0–40)

## 2022-11-15 LAB — HEPATITIS B SURFACE ANTIGEN: Hepatitis B Surface Ag: NONREACTIVE

## 2022-11-15 LAB — HEPATITIS B CORE ANTIBODY, IGM: Hep B C IgM: NONREACTIVE

## 2022-11-15 MED ORDER — POTASSIUM CHLORIDE 10 MEQ/100ML IV SOLN
10.0000 meq | INTRAVENOUS | Status: AC
  Filled 2022-11-15: qty 100

## 2022-11-15 MED ORDER — POTASSIUM CHLORIDE 10 MEQ/100ML IV SOLN: 10.0000 meq | INTRAVENOUS | Status: DC

## 2022-11-15 MED ORDER — ROSUVASTATIN CALCIUM 20 MG PO TABS
20.0000 mg | ORAL_TABLET | Freq: Every day | ORAL | Status: DC
  Administered 2022-11-15: 20 mg via ORAL
  Filled 2022-11-15: qty 1

## 2022-11-15 MED ORDER — ROSUVASTATIN CALCIUM 20 MG PO TABS
40.0000 mg | ORAL_TABLET | Freq: Every day | ORAL | Status: DC
  Administered 2022-11-16 – 2022-11-19 (×4): 40 mg via ORAL
  Filled 2022-11-15 (×4): qty 2

## 2022-11-15 NOTE — Consult Note (Signed)
[facility] Resident Consultation Note  Reason for consult: AKI on CKD with proteinuria  HPI: Stacey Lucas is a 47 y.o. with history of CVA, diabetes, and hypertension who presented with slurred speech and right-sided weakness, found to have small acute left pontine infarct.  Also with AKI and proteinuria.  This person reports malaise ongoing for the last month prior to hospitalization. She says she could spend all day and all night sleeping but still feel tired, which is unusual for her. She reports some swelling that made it difficult for her to fit into her shoes that started days to weeks ago. She also reports puffiness of her face. She reports very foamy urine that seemed to start abruptly around 2 weeks ago. She also reports decreased urination, but is uncertain about time course. She has not noticed blood in urine, or bleeding from elsewhere for that matter. She has intermittent low back pain.  PMHx: Past Medical History:  Diagnosis Date   Anxiety and depression    Cocaine abuse ([place])    Depression with suicidal ideation    [place] admission 04/2018   Diabetes mellitus without complication (HCC)    Type II   GERD (gastroesophageal reflux disease)    Hyperlipidemia    Hypertension    Impingement syndrome of right shoulder    Migraine headache    Osteoarthritis of right knee 05/24/2021   Pilonidal abscess 05/2013   Pyelonephritis    Right thyroid nodule    Sciatica     Past Surgical History:  Procedure Laterality Date   BUBBLE STUDY  01/01/2022   Procedure: BUBBLE STUDY;  Surgeon: Janina Mayo, MD;  Location: [facility];  Service: Cardiovascular;;   TEE WITHOUT CARDIOVERSION N/A 01/01/2022   Procedure: TRANSESOPHAGEAL ECHOCARDIOGRAM (TEE);  Surgeon: Janina Mayo, MD;  Location: [facility] ENDOSCOPY;  Service: Cardiovascular;  Laterality: N/A;   tubal      Family Hx: Family History  Problem Relation Age of Onset   Diabetes Mother    Hypertension Mother     Arthritis Mother    Diabetes Father    Hypertension Father    Reviewed, pertinent updates include kidney disease in her brother  Social History:  reports that she has been smoking cigarettes. She has been smoking an average of 1 pack per day. She has been exposed to tobacco smoke. She has never used smokeless tobacco. She reports current alcohol use. She reports that she does not use drugs.  Reviewed, pertinent updates include cocaine use but no history of IV drug use.  Allergies:  Allergies  Allergen Reactions   Gabapentin     Weakness/impairs balance    Medications: I have reviewed the patient's current medications. Prior to Admission:  Facility-Administered Medications Prior to Admission  Medication Dose Route Frequency Provider Last Rate Last Admin   aspirin chewable tablet 81 mg  81 mg Oral Once Freeman Caldron M, PA-C       Medications Prior to Admission  Medication Sig Dispense Refill Last Dose   azelastine (OPTIVAR) 0.05 % ophthalmic solution Place 1 drop into both eyes 2 (two) times daily. 6 mL 1 11/14/2022   ACCU-CHEK GUIDE test strip USE UP TO FOUR TIMES DAILY AS DIRECTED      Accu-Chek Softclix Lancets lancets SMARTSIG:Topical 1-4 Times Daily      amLODipine (NORVASC) 10 MG tablet Take 1 tablet (10 mg total) by mouth daily. 90 tablet 1    aspirin EC 81 MG tablet Take 1 tablet (81 mg total)  by mouth daily. Swallow whole. 100 tablet 2    blood glucose meter kit and supplies KIT Dispense based on patient and insurance preference. Use up to four times daily as directed. 1 each 0    buPROPion (WELLBUTRIN XL) 150 MG 24 hr tablet TAKE 1 TABLET(150 MG) BY MOUTH DAILY 30 tablet 2    citalopram (CELEXA) 10 MG tablet Take 1 tablet (10 mg total) by mouth daily. 60 tablet 3    clopidogrel (PLAVIX) 75 MG tablet TAKE 1 TABLET(75 MG) BY MOUTH DAILY 90 tablet 1    diclofenac Sodium (VOLTAREN) 1 % GEL Apply 4 g topically daily as needed (For knee pain). 400 g 1    glimepiride (AMARYL) 2  MG tablet Take 1 tablet (2 mg total) by mouth daily with breakfast. 60 tablet 2    Insulin Glargine (BASAGLAR KWIKPEN) 100 UNIT/ML Inject 5 Units into the skin daily. 15 mL 2    Insulin Pen Needle 32G X 4 MM MISC Use to inject insulin at bed time 100 each 0    irbesartan (AVAPRO) 75 MG tablet Take 1 tablet (75 mg total) by mouth daily. 60 tablet 2    meloxicam (MOBIC) 15 MG tablet TAKE 1 TABLET(15 MG) BY MOUTH DAILY 30 tablet 0    metoprolol succinate (TOPROL-XL) 25 MG 24 hr tablet TAKE 3 TABLETS(75 MG) BY MOUTH DAILY 270 tablet 0    rosuvastatin (CRESTOR) 20 MG tablet TAKE 1 TABLET(20 MG) BY MOUTH AT BEDTIME 30 tablet 0    Scheduled:  aspirin EC  81 mg Oral Daily   buPROPion  150 mg Oral Daily   citalopram  10 mg Oral Daily   clopidogrel  75 mg Oral Daily   enoxaparin (LOVENOX) injection  30 mg Subcutaneous Q24H   insulin aspart  0-9 Units Subcutaneous TID WC   ketotifen  2 drop Both Eyes BID   rosuvastatin  20 mg Oral Daily   Continuous: [id number], labetalol, LORazepam, mouth rinse  Labs, images, and other studies: Creatinine 4.36 BUN 40 Calcium 7.6 Albumin < 1.5 Total protein 5 UA with proteinuria, pyuria, bacteriuria Hemoglobin 7.7 Ferritin 232 TIBC 133 TSAT 31% Hemoglobin A1c 5.6% HIV screen nonreactive UDS positive for cocaine  Physical Exam: Vitals:   11/15/22 0743 11/15/22 1212  BP: (!) 143/79 (!) 156/79  Pulse: 72 76  Resp: 15 17  Temp: 98.7 F (37.1 C) 99.1 F (37.3 C)  SpO2: 100% 100%   Comfortable appearing Heart rate and rhythm is regular, no murmurs, bilateral lower extremity pitting edema to feet Breathing is regular and unlabored on room air, lungs clear to auscultation Skin is warm and dry No gross focal neurologic deficits Pleasant, mood and affect are concordant  Intake/Output Summary (Last 24 hours) at 11/15/2022 1402 Last data filed at 11/15/2022 0931 Gross per 24 hour  Intake 1095.35 ml  Output --  Net 1095.35 ml   Net IO Since  Admission: 1,095.35 mL [11/15/22 1402]  Assessment/Plan:  47 year old female with history of CVA, diabetes, and hypertension admitted for acute left pontine infarct, found to have severe AKI with proteinuria, hypoalbuminemia, and edema suggestive of nephrotic syndrome.  Nephrotic syndrome Suspect nephrotic syndrome in this person with severe hypoalbuminemia and edema, pending quantification of proteinuria with 24-hour protein.  HIV screen negative.  HCV and HBV serologies pending.  Complement levels pending.  Myeloma workup also pending.  I note proteinuria by dipstick dating back to at least 2018 suggesting a degree of renal disease, so it's also possible  that she is experiencing decompensation of chronic renal disease due to poorly controlled diabetes or hypertension.  Pending serology and quantification of proteinuria, will make a decision about whether or not to biopsy this patient's kidneys.  Edematous state less likely due to heart failure with only mild diastolic dysfunction on echo.  No overt signs of liver disease on exam, making decompensated cirrhosis less likely.  Nonoliguric AKI Outputs not measured, but this patient does not seem to have experienced a significant drop-off in her urine output.  No signs of uremia or electrolyte abnormalities.  Minimal edema on exam without cardiorespiratory compromise.  Creatinine improved somewhat after IVF, but FENa is indeterminate and given her concomitant nephrotic syndrome, suspect intrinsic renal process.  Acute left pontine infarct Young, but risk factors for ASCVD include poorly controlled diabetes, hypertension, and hyperlipidemia.  She also has cocaine use disorder.  However given the high index of suspicion for nephrotic syndrome and her severe hypoalbuminemic state, I wonder about hypercoagulability.  Additionally she has LA dilatation on her echo which hints at A-fib that could further increase her risk for embolic stroke. Before starting  anticoagulation I want to see results of urine protein and serology studies which will guide decision to perform renal biopsy.  Anemia Iron replete. Low reticulocyte index. Suspect due to renal disease.  Diabetes Insulin per primary team.  Hypertension BP management per primary team.  Nani Gasser 11/15/2022, 2:02 PM

## 2022-11-15 NOTE — Progress Notes (Signed)
PT Cancellation Note  Patient Details Name: Stacey Lucas MRN: [id number] DOB: July 21, 1976   Cancelled Treatment:    Reason Eval/Treat Not Completed: Patient at procedure or test/unavailable. PT attempted x 2. Pt eating breakfast on first attempt and in vascular lab on second attempt. PT to re-attempt as time allows.   Lorriane Shire 11/15/2022, 10:23 AM

## 2022-11-15 NOTE — TOC Initial Note (Signed)
Transition of Care [facility]) - Initial/Assessment Note    Patient Details  Name: Stacey Lucas MRN: [id number] Date of Birth: 01-31-1976  [facility] CM/SW Contact:    Pollie Friar, RN Phone Number: 11/15/2022, 1:47 PM  Clinical Narrative:                 Pt is from home with her significant other that works during the daytime.  Her SO oversees her medications and provides needed transportation. She uses [facility] representative on [place] for her medications. She is asking for aide services at home to assist her when spouse is at work. CM will complete form and send in to [place] that manages her medicaid.  Home health recommended. Pt had no preference other than not using agency she had last time. CM has arranged [place] with [facility]. Information on the AVS. They will contact her for the first home visit. TOC following for further d/c needs.     Expected Discharge Plan: [place] Barriers to Discharge: Continued Medical Work up   Patient Goals and CMS Choice   CMS Medicare.gov Compare Post Acute Care list provided to:: Patient Choice offered to / list presented to : Patient      Expected Discharge Plan and Services   Discharge Planning Services: CM Consult Post Acute Care Choice: [place] arrangements for the past 2 months: Apartment                           HH Arranged: PT, OT, Speech Therapy HH Agency: [place] Date [place]: 11/15/22   Representative spoke with at [place]: Tommi Rumps  Prior Living Arrangements/Services Living arrangements for the past 2 months: Apartment Lives with:: Significant Other Patient language and need for interpreter reviewed:: Yes Do you feel safe going back to the place where you live?: Yes        Care giver support system in place?: No (comment) Current home services: DME ([facility]ator) Criminal Activity/Legal Involvement Pertinent to Current  Situation/Hospitalization: No - Comment as needed  Activities of Daily Living Home Assistive Devices/Equipment: None ADL Screening (condition at time of admission) Patient's cognitive ability adequate to safely complete daily activities?: Yes Is the patient deaf or have difficulty hearing?: No Does the patient have difficulty seeing, even when wearing glasses/contacts?: No Does the patient have difficulty concentrating, remembering, or making decisions?: Yes Patient able to express need for assistance with ADLs?: Yes Does the patient have difficulty dressing or bathing?: No Independently performs ADLs?: Yes (appropriate for developmental age) Does the patient have difficulty walking or climbing stairs?: Yes Weakness of Legs: Right Weakness of Arms/Hands: Right  Permission Sought/Granted                  Emotional Assessment Appearance:: Appears stated age Attitude/Demeanor/Rapport: Engaged Affect (typically observed): Accepting Orientation: : Oriented to Self, Oriented to Place, Oriented to  Time, Oriented to Situation   Psych Involvement: No (comment)  Admission diagnosis:  AKI (acute kidney injury) ([place]) [N17.9] Acute CVA (cerebrovascular accident) [facility]) [I63.9] Cerebrovascular accident (CVA), unspecified mechanism ([place]) [I63.9] Patient Active Problem List   Diagnosis Date Noted   Acute CVA (cerebrovascular accident) ([place]) 11/14/2022   Anemia 01/16/2022   Acute-on-chronic kidney injury ([place]) 01/16/2022   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 12/29/2021   Atrial fibrillation ([place]) 07/04/2021   H/O: stroke 07/04/2021  Cerebral thrombosis with cerebral infarction 05/24/2021   Osteoarthritis of right knee 05/24/2021   Type 2 diabetes mellitus without complication ([place]) [id number]   Essential hypertension 05/23/2021   Hyperlipidemia 05/23/2021   Thyroid nodule 05/23/2021   MDD (major depressive  disorder), recurrent severe, without psychosis ([place]) 04/27/2018   PCP:  Elsie Stain, MD Pharmacy:   [facility], [place] AT [facility] [address] [place] [address] Phone: [phone] Fax: [phone]  [id number] - [place], [address] AT [address] [place] [address] Phone: [phone] Fax: [phone]  [facility] [address] [place] [address] Phone: [phone] Fax: [place], [address] [place] [address] Phone: [phone] Fax: [phone]     Social Determinants of Health (SDOH) Social History: SDOH Screenings   Food Insecurity: Food Insecurity Present (11/14/2022)  Housing: Medium Risk (11/14/2022)  Transportation Needs: Unmet Transportation Needs (11/14/2022)  Utilities: At Risk (11/14/2022)  Alcohol Screen: Low Risk  (04/25/2018)  Depression (PHQ2-9): Low Risk  (04/18/2022)  Tobacco Use: High Risk (11/14/2022)   SDOH Interventions:     Readmission Risk Interventions     No data to display

## 2022-11-15 NOTE — Plan of Care (Signed)
  Problem: Education: Goal: Knowledge of disease or condition will improve Outcome: Progressing Goal: Knowledge of secondary prevention will improve (MUST DOCUMENT ALL) Outcome: Progressing Goal: Knowledge of patient specific risk factors will improve (Mark N/A or DELETE if not current risk factor) Outcome: Progressing   Problem: Ischemic Stroke/TIA Tissue Perfusion: Goal: Complications of ischemic stroke/TIA will be minimized Outcome: Progressing   Problem: Coping: Goal: Will verbalize positive feelings about self Outcome: Progressing Goal: Will identify appropriate support needs Outcome: Progressing   Problem: Health Behavior/Discharge Planning: Goal: Ability to manage health-related needs will improve Outcome: Progressing Goal: Goals will be collaboratively established with patient/family Outcome: Progressing   Problem: Self-Care: Goal: Ability to participate in self-care as condition permits will improve Outcome: Progressing Goal: Verbalization of feelings and concerns over difficulty with self-care will improve Outcome: Progressing Goal: Ability to communicate needs accurately will improve Outcome: Progressing   Problem: Nutrition: Goal: Risk of aspiration will decrease Outcome: Progressing Goal: Dietary intake will improve Outcome: Progressing   Problem: Education: Goal: Knowledge of General Education information will improve Description: Including pain rating scale, medication(s)/side effects and non-pharmacologic comfort measures Outcome: Progressing   Problem: Health Behavior/Discharge Planning: Goal: Ability to manage health-related needs will improve Outcome: Progressing   Problem: Clinical Measurements: Goal: Ability to maintain clinical measurements within normal limits will improve Outcome: Progressing Goal: Will remain free from infection Outcome: Progressing Goal: Diagnostic test results will improve Outcome: Progressing Goal: Respiratory  complications will improve Outcome: Progressing Goal: Cardiovascular complication will be avoided Outcome: Progressing   Problem: Activity: Goal: Risk for activity intolerance will decrease Outcome: Progressing   Problem: Nutrition: Goal: Adequate nutrition will be maintained Outcome: Progressing   Problem: Coping: Goal: Level of anxiety will decrease Outcome: Progressing   Problem: Elimination: Goal: Will not experience complications related to bowel motility Outcome: Progressing Goal: Will not experience complications related to urinary retention Outcome: Progressing   Problem: Pain Managment: Goal: General experience of comfort will improve Outcome: Progressing   Problem: Safety: Goal: Ability to remain free from injury will improve Outcome: Progressing   Problem: Skin Integrity: Goal: Risk for impaired skin integrity will decrease Outcome: Progressing   Problem: Education: Goal: Ability to describe self-care measures that may prevent or decrease complications (Diabetes Survival Skills Education) will improve Outcome: Progressing Goal: Individualized Educational Video(s) Outcome: Progressing   Problem: Coping: Goal: Ability to adjust to condition or change in health will improve Outcome: Progressing   Problem: Fluid Volume: Goal: Ability to maintain a balanced intake and output will improve Outcome: Progressing   Problem: Health Behavior/Discharge Planning: Goal: Ability to identify and utilize available resources and services will improve Outcome: Progressing Goal: Ability to manage health-related needs will improve Outcome: Progressing   Problem: Metabolic: Goal: Ability to maintain appropriate glucose levels will improve Outcome: Progressing   Problem: Nutritional: Goal: Maintenance of adequate nutrition will improve Outcome: Progressing Goal: Progress toward achieving an optimal weight will improve Outcome: Progressing   Problem: Skin  Integrity: Goal: Risk for impaired skin integrity will decrease Outcome: Progressing   Problem: Tissue Perfusion: Goal: Adequacy of tissue perfusion will improve Outcome: Progressing   

## 2022-11-15 NOTE — Progress Notes (Signed)
Subjective:   Summary: Abrial Dellapenna is a 47 y.o. year old female with PMHx of two CVAs in the past two years, T2DM, HTN and polysubstance use currently admitted on the IMTS HD#0 for acute CVA and new AKI.  Overnight Events: None  Ms. Wied feels fine today. She states that sometimes it feels like "her heart is beating fast and then normalizes." Ms. Finan reports using a walker at home and being able to complete ADLs pretty independently prior to coming in. She had been taking her aspirin regularly, but thinks she has been out of her plavix for the past 2 weeks. She denies chest pain or trouble with breathing.    Ms. Dosch became tearful when given the news regarding her kidney function and AKI.   Objective:  Vital signs in last 24 hours: Vitals:   11/14/22 1933 11/14/22 2306 11/15/22 0324 11/15/22 0743  BP: (!) 142/78 138/66 (!) 148/78 (!) 143/79  Pulse: 82 78 80 72  Resp: '16 16 16 15  '$ Temp: 97.6 F (36.4 C) 98.4 F (36.9 C) 98.4 F (36.9 C) 98.7 F (37.1 C)  TempSrc: Oral Oral Oral Oral  SpO2: 100% 100% 99% 100%  Weight:      Height:       Supplemental O2: Room Air SpO2: 100 %   Physical Exam:  Constitutional: in no acute distress Cardiovascular: RRR, no murmurs, rubs or gallops Pulmonary/Chest: normal work of breathing on room air, lungs clear to auscultation bilaterally Abdominal: soft, non-tender, non-distended Skin: warm and dry. Swelling around eyes and in her legs for the past several weeks. Extremities: upper/lower extremity pulses 2+, no lower extremity edema present Neuro: Alert and oriented x3. No uvular or tongue deviation.bilateral sensation is intact on upper and lower extremities. mild dysarthria present during interview. Overall right side strength is diminished compared to the left side. Bilateral grip strength is weak, with right side significantly weaker. Right side dorsiflexion is weak.   Filed Weights   11/14/22 1000  Weight: 72 kg      Intake/Output Summary (Last 24 hours) at 11/15/2022 0807 Last data filed at 11/15/2022 0306 Gross per 24 hour  Intake 978.35 ml  Output --  Net 978.35 ml   Net IO Since Admission: 978.35 mL [11/15/22 0807]  Pertinent Labs:    Latest Ref Rng & Units 11/15/2022    6:18 AM 11/14/2022   10:14 AM 11/14/2022   10:13 AM  CBC  WBC 4.0 - 10.5 K/uL 9.8  16.0    Hemoglobin 12.0 - 15.0 g/dL 7.7  9.8  9.2   Hematocrit 36.0 - 46.0 % 22.5  29.5  27.0   Platelets 150 - 400 K/uL 228  291         Latest Ref Rng & Units 11/15/2022    6:18 AM 11/14/2022   10:14 AM 11/14/2022   10:13 AM  CMP  Glucose 70 - 99 mg/dL 144  103  93   BUN 6 - 20 mg/dL 40  35  37   Creatinine 0.44 - 1.00 mg/dL 4.36  4.28  4.90   Sodium 135 - 145 mmol/L 139  138  138   Potassium 3.5 - 5.1 mmol/L 3.4  3.9  4.0   Chloride 98 - 111 mmol/L 110  109  109   CO2 22 - 32 mmol/L 22  17    Calcium 8.9 - 10.3 mg/dL 7.6  8.1    Total Protein 6.5 - 8.1 g/dL  5.0    Total Bilirubin 0.3 - 1.2 mg/dL  0.4    Alkaline Phos 38 - 126 U/L  69    AST 15 - 41 U/L  24    ALT 0 - 44 U/L  12      I personally reviewed interval labs and pertinent results include: leukocytosis, anemia, uremia, hyperglycemia, hypokalemia, and elevated Cr of 4.36 compared to baseline of 1.6.   Imaging: MR ANGIO HEAD WO CONTRAST  Result Date: 11/15/2022 CLINICAL DATA:  Acute infarct in the left pons on recent MRI head EXAM: MRA HEAD WITHOUT CONTRAST TECHNIQUE: Angiographic images of the Circle of Willis were acquired using MRA technique without intravenous contrast. COMPARISON:  MRI head 11/14/2022, CTA head 12/29/2021 FINDINGS: Anterior circulation: Both internal carotid arteries are patent to the termini, without significant stenosis. A1 segments patent. Normal anterior communicating artery. Anterior cerebral arteries are patent to their distal aspects. No M1 stenosis or occlusion. Normal MCA bifurcations. Distal MCA branches perfused and symmetric. Posterior  circulation: Vertebral arteries patent to the vertebrobasilar junction without stenosis. Posterior inferior cerebral arteries patent bilaterally. Basilar patent to its distal aspect. Superior cerebellar arteries patent bilaterally. Patent P1 segments. PCAs perfused to their distal aspects without stenosis. The bilateral posterior communicating arteries are not visualized. Anatomic variants: None significant IMPRESSION: No intracranial large vessel occlusion or significant stenosis. Electronically Signed   By: Merilyn Baba M.D.   On: 11/15/2022 01:06   DG CHEST PORT 1 VIEW  Result Date: 11/14/2022 CLINICAL DATA:  Leukocytosis EXAM: PORTABLE CHEST 1 VIEW COMPARISON:  12/29/2021 FINDINGS: Single frontal view of the chest demonstrates an unremarkable cardiac silhouette. No airspace disease, effusion, or pneumothorax. No acute bony abnormalities. IMPRESSION: 1. No acute intrathoracic process. Electronically Signed   By: Randa Ngo M.D.   On: 11/14/2022 15:53   US RENAL  Result Date: 11/14/2022 CLINICAL DATA:  AKI EXAM: RENAL / URINARY TRACT ULTRASOUND COMPLETE COMPARISON:  CT abdomen/pelvis 10/27/2019 FINDINGS: Right Kidney: Renal measurements: 9.2 cm x 4.0 cm x 5.4 cm = volume: 105 mL. Parenchymal echogenicity is increased. No mass or hydronephrosis visualized. Left Kidney: Renal measurements: 9.5 cm x 6.6 cm x 6.0 cm = volume: 195 mL. Parenchymal echogenicity is increased. No mass or hydronephrosis visualized. Bladder: Appears normal for degree of bladder distention. Other: None. IMPRESSION: Increased cortical echogenicity bilaterally suggestive of medical renal disease. No hydronephrosis. Electronically Signed   By: Valetta Mole M.D.   On: 11/14/2022 15:13   MR BRAIN WO CONTRAST  Result Date: 11/14/2022 EXAM: MRI HEAD WITHOUT CONTRAST TECHNIQUE: Multiplanar, multiecho pulse sequences of the brain and surrounding structures were obtained without intravenous contrast. COMPARISON:  Same day CT head.  FINDINGS: Brain: Small acute infarct in the left pons. No significant edema or mass effect. No evidence of acute hemorrhage, mass lesion, midline shift or hydrocephalus. Patchy white matter T2/FLAIR hyperintensities, nonspecific but compatible with chronic microvascular ischemic disease. Vascular: Major arterial flow voids are maintained at the skull base. Skull and upper cervical spine: Normal marrow signal. Sinuses/Orbits: Mild paranasal sinus mucosal thickening. Remote left medial orbital wall fracture. Other: No sizable mastoid effusions. IMPRESSION: Small acute infarct in the left pons. Electronically Signed   By: Margaretha Sheffield M.D.   On: 11/14/2022 11:40   CT HEAD CODE STROKE WO CONTRAST  Result Date: 11/14/2022 CLINICAL DATA:  Code stroke.  Neuro deficit, acute, stroke suspected EXAM: CT HEAD WITHOUT CONTRAST TECHNIQUE: Contiguous axial images  were obtained from the base of the skull through the vertex without intravenous contrast. RADIATION DOSE REDUCTION: This exam was performed according to the departmental dose-optimization program which includes automated exposure control, adjustment of the mA and/or kV according to patient size and/or use of iterative reconstruction technique. COMPARISON:  None Available. FINDINGS: Brain: No evidence of acute large vascular territory infarction, hemorrhage, hydrocephalus, extra-axial collection or mass lesion/mass effect. Vascular: No hyperdense vessel identified. Skull: No acute fracture. Sinuses/Orbits: Remote left medial orbital wall fracture. No acute orbital findings. Other: No mastoid effusions. ASPECTS [facility] Stroke Program Early CT Score) total score (0-10 with 10 being normal): 10. IMPRESSION: 1. No evidence of acute large vascular territory infarct or acute hemorrhage. ASPECTS is 10. 2. Remote left basal ganglia perforator infarct. Code stroke imaging results were communicated on 11/14/2022 at 10:30 am to provider stack via secure text paging.  Electronically Signed   By: Margaretha Sheffield M.D.   On: 11/14/2022 10:30   I personally reviewed interval imaging and my interpretation is as follows: Her Chest X-ray demonstrated no acute airspace disease. Renal Ultrasound significant for renal disease. CT head code stroke ruled out large vascular infarction, hemorrhage, hydrocephalus, or mass effect. Brain MRI revealed small acute infarct in left pons, and remote left basal ganglia infarct.   Assessment/Plan:   Active Problems:   Acute CVA (cerebrovascular accident) [facility])   Patient Summary: Vieva Alva is a 47 y.o. with a pertinent PMH of two prior CVAs, T2DM, HTN, Hyperlipidemia, and Polysubstance Use, who presented with new onset right side weakness and admitted for acute left pons CVA and new AKI.     #Acute Left Pons CVA MRI imaging is consistent with an acute left pons CVA. Patient is mildly dysarthric on exam. Patient was without her Plavix for two weeks, and uses cocaine, which likely precipitated her presentation yesterday. In addition to these risk factors, she is also being worked up with a hypercoagulable panel to identify or rule out underlying causes of multiple CVAs including Protein C or S deficiency, Antithrombin III deficiency and Factor V Leiden.  - 24hr telemetry to assess for cardiac arrhythmias - ASA '81mg'$ , Plavix '75mg'$  - Neuro consult - PT in acute inpatient setting to restore baseline function - hypercoagulable panel f/u   #AKI on CKD Creatinine on presentation yesterday was 4.9, now down to 4.36, but still significantly elevated from her baseline of 1.6 after receiving IVF. Her renal ultrasound shows evidence of chronic kidney disease. She also has significant proteinuria (>300) and ketonuria. Given her CRAB signs (renal disease, anemia, and bone pain) she is being worked up for multiple myeloma. Additionally, her peripheral edema in face and legs raises concern for nephrotic syndrome. - nephrology consult - pending  multiple myeloma studies for UPEP and SPEP - KCl 28mq in 1022mIVPB  #Microcytic Anemia Last Hgb is 8 and hematocrit is 23.8. Minimal improvement from yesterday. Could be related to her chronic kidney disease. She is not a candidate for EPO due to acute stroke. - repeat Hgb   #T2DM - continue SSI insulin aspart 0-9 units  #HTN Patient has been hypertensive since admission.  - continue amlodipine '10mg'$  daily  #Hyperlipidemia Lipid panel demonstrates elevated triglycerides (205) and VLDL (41). She states that she has been out of her Crestor for about 2 weeks. This could be a potential reason for her undercontrolled lipidemia.  - continue Crestor 20 mg daily  - outpatient f/u to refill Crestor for TOC  #Asymptomatic pyuria  She has  leukocytosis, but denies any UTI symptoms. - no treatment indicated  #Polysubstance Use - cocaine use cessation  #Depression  No concerns in the acute setting. She can continue her at home medication.  - continue celexa and wellbutrin   Diet: Normal IVF: None VTE: Enoxaparin Code: Full  J [place] Student

## 2022-11-15 NOTE — Progress Notes (Addendum)
Pt to vascular via bed 24hr .  Returned from vascular via bed '@1042'$ 

## 2022-11-15 NOTE — Hospital Course (Addendum)
She feels the same today. No chest pain. Feels like her heart is beating fast and then normalizes. Her breathing feels fine. Feels weak on her right side. She was using a walker at home and was pretty independent prior to coming in. She has been taking her aspirin regularly, but thinks she has been out of her plavix for the past 2 weeks. She has been drinking water at home, that's all she drinks.   She has some swelling around her face which has been present for the past few weeks. Face started swelling first, then her legs as well.

## 2022-11-15 NOTE — H&P (Deleted)
[facility] Resident Consultation Note  Reason for consult: AKI on CKD with proteinuria  HPI: Zoiey Calix is a 47 y.o. with history of CVA, diabetes, and hypertension who presented with slurred speech and right-sided weakness, found to have small acute left pontine infarct.  Also with AKI and proteinuria.  This person reports malaise ongoing for the last month prior to hospitalization. She says she could spend all day and all night sleeping but still feel tired, which is unusual for her. She reports some swelling that made it difficult for her to fit into her shoes that started days to weeks ago. She also reports puffiness of her face. She reports very foamy urine that seemed to start abruptly around 2 weeks ago. She also reports decreased urination, but is uncertain about time course. She has not noticed blood in urine, or bleeding from elsewhere for that matter. She has intermittent low back pain.  PMHx: Past Medical History:  Diagnosis Date   Anxiety and depression    Cocaine abuse ([place])    Depression with suicidal ideation    [place] admission 04/2018   Diabetes mellitus without complication (HCC)    Type II   GERD (gastroesophageal reflux disease)    Hyperlipidemia    Hypertension    Impingement syndrome of right shoulder    Migraine headache    Osteoarthritis of right knee 05/24/2021   Pilonidal abscess 05/2013   Pyelonephritis    Right thyroid nodule    Sciatica     Past Surgical History:  Procedure Laterality Date   BUBBLE STUDY  01/01/2022   Procedure: BUBBLE STUDY;  Surgeon: Janina Mayo, MD;  Location: [place];  Service: Cardiovascular;;   TEE WITHOUT CARDIOVERSION N/A 01/01/2022   Procedure: TRANSESOPHAGEAL ECHOCARDIOGRAM (TEE);  Surgeon: Janina Mayo, MD;  Location: [facility] ENDOSCOPY;  Service: Cardiovascular;  Laterality: N/A;   tubal      Family Hx: Family History  Problem Relation Age of Onset   Diabetes Mother    Hypertension Mother     Arthritis Mother    Diabetes Father    Hypertension Father    Reviewed, pertinent updates include kidney disease in her brother  Social History:  reports that she has been smoking cigarettes. She has been smoking an average of 1 pack per day. She has been exposed to tobacco smoke. She has never used smokeless tobacco. She reports current alcohol use. She reports that she does not use drugs.  Reviewed, pertinent updates include cocaine use but no history of IV drug use.  Allergies:  Allergies  Allergen Reactions   Gabapentin     Weakness/impairs balance    Medications: I have reviewed the patient's current medications. Prior to Admission:  Facility-Administered Medications Prior to Admission  Medication Dose Route Frequency Provider Last Rate Last Admin   aspirin chewable tablet 81 mg  81 mg Oral Once Freeman Caldron M, PA-C       Medications Prior to Admission  Medication Sig Dispense Refill Last Dose   azelastine (OPTIVAR) 0.05 % ophthalmic solution Place 1 drop into both eyes 2 (two) times daily. 6 mL 1 11/14/2022   ACCU-CHEK GUIDE test strip USE UP TO FOUR TIMES DAILY AS DIRECTED      Accu-Chek Softclix Lancets lancets SMARTSIG:Topical 1-4 Times Daily      amLODipine (NORVASC) 10 MG tablet Take 1 tablet (10 mg total) by mouth daily. 90 tablet 1    aspirin EC 81 MG tablet Take 1 tablet (81 mg total)  by mouth daily. Swallow whole. 100 tablet 2    blood glucose meter kit and supplies KIT Dispense based on patient and insurance preference. Use up to four times daily as directed. 1 each 0    buPROPion (WELLBUTRIN XL) 150 MG 24 hr tablet TAKE 1 TABLET(150 MG) BY MOUTH DAILY 30 tablet 2    citalopram (CELEXA) 10 MG tablet Take 1 tablet (10 mg total) by mouth daily. 60 tablet 3    clopidogrel (PLAVIX) 75 MG tablet TAKE 1 TABLET(75 MG) BY MOUTH DAILY 90 tablet 1    diclofenac Sodium (VOLTAREN) 1 % GEL Apply 4 g topically daily as needed (For knee pain). 400 g 1    glimepiride (AMARYL) 2  MG tablet Take 1 tablet (2 mg total) by mouth daily with breakfast. 60 tablet 2    Insulin Glargine (BASAGLAR KWIKPEN) 100 UNIT/ML Inject 5 Units into the skin daily. 15 mL 2    Insulin Pen Needle 32G X 4 MM MISC Use to inject insulin at bed time 100 each 0    irbesartan (AVAPRO) 75 MG tablet Take 1 tablet (75 mg total) by mouth daily. 60 tablet 2    meloxicam (MOBIC) 15 MG tablet TAKE 1 TABLET(15 MG) BY MOUTH DAILY 30 tablet 0    metoprolol succinate (TOPROL-XL) 25 MG 24 hr tablet TAKE 3 TABLETS(75 MG) BY MOUTH DAILY 270 tablet 0    rosuvastatin (CRESTOR) 20 MG tablet TAKE 1 TABLET(20 MG) BY MOUTH AT BEDTIME 30 tablet 0    Scheduled:  aspirin EC  81 mg Oral Daily   buPROPion  150 mg Oral Daily   citalopram  10 mg Oral Daily   clopidogrel  75 mg Oral Daily   enoxaparin (LOVENOX) injection  30 mg Subcutaneous Q24H   insulin aspart  0-9 Units Subcutaneous TID WC   ketotifen  2 drop Both Eyes BID   rosuvastatin  20 mg Oral Daily   Continuous: [id number], labetalol, LORazepam, mouth rinse  Labs, images, and other studies: Creatinine 4.36 BUN 40 Calcium 7.6 Albumin < 1.5 Total protein 5 UA with proteinuria, pyuria, bacteriuria Hemoglobin 7.7 Ferritin 232 TIBC 133 TSAT 31% Hemoglobin A1c 5.6% HIV screen nonreactive UDS positive for cocaine  Physical Exam: Vitals:   11/15/22 0743 11/15/22 1212  BP: (!) 143/79 (!) 156/79  Pulse: 72 76  Resp: 15 17  Temp: 98.7 F (37.1 C) 99.1 F (37.3 C)  SpO2: 100% 100%   Comfortable appearing Heart rate and rhythm is regular, no murmurs, bilateral lower extremity pitting edema to feet Breathing is regular and unlabored on room air, lungs clear to auscultation Skin is warm and dry No gross focal neurologic deficits Pleasant, mood and affect are concordant  Intake/Output Summary (Last 24 hours) at 11/15/2022 1402 Last data filed at 11/15/2022 0931 Gross per 24 hour  Intake 1095.35 ml  Output --  Net 1095.35 ml   Net IO Since  Admission: 1,095.35 mL [11/15/22 1402]  Assessment/Plan:  47 year old female with history of CVA, diabetes, and hypertension admitted for acute left pontine infarct, found to have severe AKI with proteinuria, hypoalbuminemia, and edema suggestive of nephrotic syndrome.  Nephrotic syndrome Suspect nephrotic syndrome in this person with severe hypoalbuminemia and edema, pending quantification of proteinuria with 24-hour protein.  HIV screen negative.  HCV and HBV serologies pending.  Complement levels pending.  Myeloma workup also pending.  I note proteinuria by dipstick dating back to at least 2018 suggesting a degree of renal disease, so it's also possible  that she is experiencing decompensation of chronic renal disease due to poorly controlled diabetes or hypertension.  Pending serology and quantification of proteinuria, will make a decision about whether or not to biopsy this patient's kidneys.  Edematous state less likely due to heart failure with only mild diastolic dysfunction on echo.  No overt signs of liver disease on exam, making decompensated cirrhosis less likely.  Nonoliguric AKI Outputs not measured, but this patient does not seem to have experienced a significant drop-off in her urine output.  No signs of uremia or electrolyte abnormalities.  Minimal edema on exam without cardiorespiratory compromise.  Creatinine improved somewhat after IVF, but FENa is indeterminate and given her concomitant nephrotic syndrome, suspect intrinsic renal process.  Acute left pontine infarct Young, but risk factors for ASCVD include poorly controlled diabetes, hypertension, and hyperlipidemia.  She also has cocaine use disorder.  However given the high index of suspicion for nephrotic syndrome and her severe hypoalbuminemic state, I wonder about hypercoagulability.  Additionally she has LA dilatation on her echo which hints at A-fib that could further increase her risk for embolic stroke. Before starting  anticoagulation I want to see results of urine protein and serology studies which will guide decision to perform renal biopsy.  Anemia Iron replete. Low reticulocyte index. Suspect due to renal disease.  Diabetes Insulin per primary team.  Hypertension BP management per primary team.  Nani Gasser 11/15/2022, 2:02 PM

## 2022-11-15 NOTE — Evaluation (Signed)
Occupational Therapy Evaluation Patient Details Name: Stacey Lucas MRN: [id number] DOB: [birth year] Today's Date: 11/15/2022   History of Present Illness 47 yo female presents to [facility] on 3/7 with R weakness, slurred speech. MRI brain shows Small acute infarct in the left pons. PMH includes PMH significant for previous stroke in 2023 (L basal ganglia), anxiety/depression/suicidal ideation, diabetes, hyperlipidemia, hypertension, migraines, cocaine abuse.   Clinical Impression   Patient admitted for above and presents with problem list below.  She reports completing ADLs with independence, managing meds with support from boyfriend as needed, using rollator for mobility.  She demonstrates decreased awareness to situation, reporting not knowing why she is in the hospital. Otherwise, she is oriented, follows simple commands but demonstrates decreased problem solving.  She completes ADLs and functional mobility with min assist, using RW given intermittent cueing for safety.  She reports typically "sleeping" while boyfriend is at work.  Recommend support for all IADls at dc, and initial 24/7 support for safety. Will follow acutely to optimize independence and safety with recommendations for HHOT at dc.      Recommendations for follow up therapy are one component of a multi-disciplinary discharge planning process, led by the attending physician.  Recommendations may be updated based on patient status, additional functional criteria and insurance authorization.   Follow Up Recommendations  Home health OT     Assistance Recommended at Discharge Frequent or constant Supervision/Assistance (initally)  Patient can return home with the following A little help with walking and/or transfers;A little help with bathing/dressing/bathroom;Assistance with cooking/housework;Direct supervision/assist for medications management;Direct supervision/assist for financial management;Assist for transportation;Help with stairs or  ramp for entrance    Functional Status Assessment  Patient has had a recent decline in their functional status and demonstrates the ability to make significant improvements in function in a reasonable and predictable amount of time.  Equipment Recommendations  BSC/3in1    Recommendations for Other Services       Precautions / Restrictions Precautions Precautions: Fall Restrictions Weight Bearing Restrictions: No      Mobility Bed Mobility               General bed mobility comments: EOB upon entry and at exit    Transfers Overall transfer level: Needs assistance Equipment used: Rolling walker (2 wheels) Transfers: Sit to/from Stand Sit to Stand: Min assist           General transfer comment: min assist to power up and steady      Balance Overall balance assessment: Needs assistance Sitting-balance support: No upper extremity supported, Feet supported Sitting balance-Leahy Scale: Fair     Standing balance support: Bilateral upper extremity supported, During functional activity, No upper extremity supported Standing balance-Leahy Scale: Poor                             ADL either performed or assessed with clinical judgement   ADL Overall ADL's : Needs assistance/impaired     Grooming: Standing;Minimal assistance           Upper Body Dressing : Sitting;Set up   Lower Body Dressing: Minimal assistance;Sit to/from stand Lower Body Dressing Details (indicate cue type and reason): able to don/doff socks with effort and increased time, min assist to power up and steady Toilet Transfer: Minimal assistance;Ambulation;Rolling walker (2 wheels)           Functional mobility during ADLs: Minimal assistance;Rolling walker (2 wheels)       Vision  Baseline Vision/History: 0 No visual deficits Patient Visual Report: Blurring of vision Additional Comments: brief assessment completed, appears WFL with tracking, reading and quadrants      Perception     Praxis      Pertinent Vitals/Pain Pain Assessment Pain Assessment: Faces Faces Pain Scale: No hurt Pain Intervention(s): Monitored during session     Hand Dominance Right   Extremity/Trunk Assessment Upper Extremity Assessment Upper Extremity Assessment: RUE deficits/detail RUE Deficits / Details: R UE grossly 3+/5 MMT (compared to 4/5 L UE), reports hx of weakness but weaker than normal.  decreased coordination RUE Sensation: WNL RUE Coordination: decreased fine motor;decreased gross motor   Lower Extremity Assessment Lower Extremity Assessment: Defer to PT evaluation   Cervical / Trunk Assessment Cervical / Trunk Assessment: Normal   Communication Communication Communication: Expressive difficulties   Cognition Arousal/Alertness: Awake/alert Behavior During Therapy: WFL for tasks assessed/performed Overall Cognitive Status: Impaired/Different from baseline Area of Impairment: Orientation, Problem solving, Awareness, Safety/judgement, Following commands                 Orientation Level: Disoriented to, Situation     Following Commands: Follows one step commands consistently, Follows one step commands with increased time, Follows multi-step commands inconsistently Safety/Judgement: Decreased awareness of safety, Decreased awareness of deficits Awareness: Emergent Problem Solving: Slow processing, Difficulty sequencing, Requires verbal cues General Comments: pt reporting "I don't know why I'm here".  Able to report slurred speech as a possible reason when questioned.  She demonstrates slow processing and decreased probelm solving.  Recommend assist with all IADLs.     General Comments       Exercises     Shoulder Instructions      Home Living Family/patient expects to be discharged to:: Private residence Living Arrangements: Spouse/significant other Available Help at Discharge: Family Type of Home: Apartment Home Access: Level entry      Home Layout: One level     Bathroom Shower/Tub: Teacher, early years/pre: Standard     Home Equipment: Rollator (4 wheels);Grab bars - tub/shower   Additional Comments: boyfriend works during daytime      Prior Functioning/Environment Prior Level of Function : Needs assist             Mobility Comments: rollator for mobility x 1 year ADLs Comments: independent ADLs, manages medication (boyfriend assists as needed). not driving        OT Problem List: Decreased strength;Decreased activity tolerance;Impaired balance (sitting and/or standing);Decreased coordination;Decreased cognition;Decreased safety awareness;Decreased knowledge of use of DME or AE;Decreased knowledge of precautions;Impaired UE functional use      OT Treatment/Interventions: Self-care/ADL training;Neuromuscular education;DME and/or AE instruction;Balance training;Patient/family education;Cognitive remediation/compensation;Therapeutic activities    OT Goals(Current goals can be found in the care plan section) Acute Rehab OT Goals Patient Stated Goal: home OT Goal Formulation: With patient Time For Goal Achievement: 11/29/22 Potential to Achieve Goals: Good  OT Frequency: Min 2X/week    Co-evaluation              AM-PAC OT "6 Clicks" Daily Activity     Outcome Measure Help from another person eating meals?: A Little Help from another person taking care of personal grooming?: A Little Help from another person toileting, which includes using toliet, bedpan, or urinal?: A Little Help from another person bathing (including washing, rinsing, drying)?: A Little Help from another person to put on and taking off regular upper body clothing?: A Little Help from another person to put on and taking  off regular lower body clothing?: A Little 6 Click Score: 18   End of Session Equipment Utilized During Treatment: Rolling walker (2 wheels) Nurse Communication: Mobility status  Activity Tolerance:  Patient tolerated treatment well Patient left: with call bell/phone within reach;with bed alarm set;with nursing/sitter in room;Other (comment) (sitting EOB)  OT Visit Diagnosis: Other abnormalities of gait and mobility (R26.89);Muscle weakness (generalized) (M62.81);Hemiplegia and hemiparesis;Other symptoms and signs involving cognitive function Hemiplegia - Right/Left: Right Hemiplegia - dominant/non-dominant: Dominant Hemiplegia - caused by: Cerebral infarction                [id number] OT Time Calculation (min): 24 min Charges:  OT General Charges $OT Visit: 1 Visit OT Evaluation $OT Eval Moderate Complexity: 1 Mod OT Treatments $Self Care/Home Management : 8-22 mins  Jolaine Artist, OT Acute Rehabilitation Services Office [phone]   Delight Stare 11/15/2022, 10:04 AM

## 2022-11-15 NOTE — Progress Notes (Signed)
1900 ended 24 hr urine collection sent on ice to lab with phblebotomy

## 2022-11-15 NOTE — Evaluation (Signed)
Speech Language Pathology Evaluation Patient Details Name: Stacey Lucas MRN: [id number] DOB: Nov 08, 1975 Today's Date: 11/15/2022 Time: [id number] SLP Time Calculation (min) (ACUTE ONLY): 21 min  Problem List:  Patient Active Problem List   Diagnosis Date Noted   Nephrotic syndrome 11/15/2022   Cerebrovascular accident (CVA) ([place]) 11/15/2022   AKI (acute kidney injury) ([place]) 11/15/2022   Acute CVA (cerebrovascular accident) ([place]) 11/14/2022   Anemia 01/16/2022   Acute-on-chronic kidney injury ([place]) 01/16/2022   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 12/29/2021   Atrial fibrillation ([place]) 07/04/2021   H/O: stroke 07/04/2021   Cerebral thrombosis with cerebral infarction 05/24/2021   Osteoarthritis of right knee 05/24/2021   Type 2 diabetes mellitus without complication ([place]) [id number]   Essential hypertension 05/23/2021   Hyperlipidemia 05/23/2021   Thyroid nodule 05/23/2021   MDD (major depressive disorder), recurrent severe, without psychosis ([place]) 04/27/2018   Past Medical History:  Past Medical History:  Diagnosis Date   Anxiety and depression    Cocaine abuse ([place])    Depression with suicidal ideation    [place] admission 04/2018   Diabetes mellitus without complication (HCC)    Type II   GERD (gastroesophageal reflux disease)    Hyperlipidemia    Hypertension    Impingement syndrome of right shoulder    Migraine headache    Osteoarthritis of right knee 05/24/2021   Pilonidal abscess 05/2013   Pyelonephritis    Right thyroid nodule    Sciatica    Past Surgical History:  Past Surgical History:  Procedure Laterality Date   BUBBLE STUDY  01/01/2022   Procedure: BUBBLE STUDY;  Surgeon: Janina Mayo, MD;  Location: [facility];  Service: Cardiovascular;;   TEE WITHOUT CARDIOVERSION N/A 01/01/2022   Procedure: TRANSESOPHAGEAL ECHOCARDIOGRAM (TEE);  Surgeon: Janina Mayo, MD;  Location: [facility] ENDOSCOPY;   Service: Cardiovascular;  Laterality: N/A;   tubal     HPI:  Pt is a 47 y/o female who presented with slurred speech & right sided weakness. MRI brain 3/7: Small acute infarct in the left pons. Pt found to have AKI and UDS positive for cocaine. PMH: stroke (2023, basal ganglia, residual right arm weakness), anxiety, depression, T2DM, HTN. SLUMS 01/10/22 prior to discharge from CIR: Pt scored a 26/30; pt primarily exhibited increased difficulty with short-term memory elements and excelled in other domains.   Assessment / Plan / Recommendation Clinical Impression  Pt participated in speech-language-cognition evaluation with her fiance present for part of the evaluation. Pt reported that she completed high school, lives with her fiance, and works full-time as a Secretary/administrator for [employer]. Pt stated that she manges her finances, and managed her medications, up until her fiance noticed that she was taking a medication at the incorrect time. Pt reported that her speech is not as clear as at baseline and indicated that she believes it is now approximately 40% back to baseline. The [facility] Mental Status Examination was completed to evaluate the pt's cognitive-linguistic skills. She achieved a score of 21/30 which is below the normal limits of 27 or more out of 30 and is suggestive of a mild impairment. This is also indicative of a decline compared to her May, 2023 performance after rehab. She exhibited difficulty in the areas of selective attention, memory, and executive function as it relates to awareness and complex problem solving. She also presented with mild-moderate dysarthria characterized by reduced articulatory precision which negatively impacted speech intelligibility at the  conversational level. Skilled SLP services are clinically indicated at this time to improve motor speech and cognitive-linguistic function.    SLP Assessment  SLP Recommendation/Assessment: Patient needs continued Speech  [place] [facility] SLP Visit Diagnosis: Cognitive communication deficit (R41.841);Dysarthria and anarthria (R47.1)    Recommendations for follow up therapy are one component of a multi-disciplinary discharge planning process, led by the attending physician.  Recommendations may be updated based on patient status, additional functional criteria and insurance authorization.    Follow Up Recommendations  Home health SLP    Assistance Recommended at Discharge  Intermittent Supervision/Assistance  Functional Status Assessment Patient has had a recent decline in their functional status and demonstrates the ability to make significant improvements in function in a reasonable and predictable amount of time.  Frequency and Duration min 2x/week  2 weeks      SLP Evaluation Cognition  Overall Cognitive Status: Impaired/Different from baseline Arousal/Alertness: Awake/alert Orientation Level: Oriented X4 Year: 2024 Month: March Day of Week: Correct Attention: Focused;Sustained;Selective Focused Attention: Appears intact Sustained Attention: Appears intact Selective Attention: Impaired Selective Attention Impairment: Verbal complex Memory: Impaired Memory Impairment:  (immediate: 5/5; delayed: 3/5; with cues: 2/2) Awareness: Impaired Awareness Impairment: Emergent impairment Problem Solving: Impaired Problem Solving Impairment: Verbal complex (Money: 3/3; time: 1/1 with self-correction) Executive Function: Sequencing;Organizing Sequencing: Impaired Sequencing Impairment: Verbal complex (clock drawing: 2/4) Organizing:  (backward digit span: 2/2)       Comprehension  Auditory Comprehension Overall Auditory Comprehension: Appears within functional limits for tasks assessed Yes/No Questions: Within Functional Limits Commands: Within Functional Limits Conversation: Complex    Expression Verbal Expression Overall Verbal Expression: Appears within functional limits for tasks  assessed Initiation: No impairment Level of Generative/Spontaneous Verbalization: Conversation Repetition: No impairment Naming: No impairment   Oral / Surveyor, quantity Overall Motor Speech: Impaired Respiration: Impaired Level of Impairment: Conversation Phonation: Normal Resonance: Within functional limits Articulation: Impaired Level of Impairment: Conversation Intelligibility: Intelligibility reduced Word: 75-100% accurate Phrase: 75-100% accurate Sentence: 75-100% accurate Conversation: 75-100% accurate Motor Planning: Witnin functional limits Motor Speech Errors: Aware;Consistent           Bernis Stecher I. Hardin Negus, [place], [place] Office number [phone]  Horton Marshall 11/15/2022, 4:44 PM

## 2022-11-15 NOTE — Progress Notes (Addendum)
STROKE TEAM PROGRESS NOTE   INTERVAL HISTORY Medical team is at the bedside. Nephrology consult today. She has some swelling in her face and bilateral lower extremities left worse than right. She tells [place] that she ran out of Plavix about 2 weeks ago so she has only been taking aspirin 81 mg.  UDS positive for cocaine.  Hemoglobin A1c is improved to 5.6.  Vitals:   11/14/22 1933 11/14/22 2306 11/15/22 0324 11/15/22 0743  BP: (!) 142/78 138/66 (!) 148/78 (!) 143/79  Pulse: 82 78 80 72  Resp: '16 16 16 15  '$ Temp: 97.6 F (36.4 C) 98.4 F (36.9 C) 98.4 F (36.9 C) 98.7 F (37.1 C)  TempSrc: Oral Oral Oral Oral  SpO2: 100% 100% 99% 100%  Weight:      Height:       CBC:  Recent Labs  Lab 11/14/22 1014 11/15/22 0618  WBC 16.0* 9.8  NEUTROABS 9.7* 5.3  HGB 9.8* 7.7*  HCT 29.5* 22.5*  MCV 76.2* 74.0*  PLT 291 [id number]   Basic Metabolic Panel:  Recent Labs  Lab 11/14/22 1014 11/15/22 0618  NA 138 139  K 3.9 3.4*  CL 109 110  CO2 17* 22  GLUCOSE 103* 144*  BUN 35* 40*  CREATININE 4.28* 4.36*  CALCIUM 8.1* 7.6*   Lipid Panel:  Recent Labs  Lab 11/15/22 0618  CHOL 190  TRIG 205*  HDL 50  CHOLHDL 3.8  VLDL 41*  LDLCALC 99   HgbA1c:  Recent Labs  Lab 11/14/22 1014  HGBA1C 5.6   Urine Drug Screen:  Recent Labs  Lab 11/14/22 1902  LABOPIA NONE DETECTED  COCAINSCRNUR POSITIVE*  LABBENZ NONE DETECTED  AMPHETMU NONE DETECTED  THCU NONE DETECTED  LABBARB NONE DETECTED    Alcohol Level  Recent Labs  Lab 11/14/22 1003  ETH <10    IMAGING past 24 hours MR ANGIO HEAD WO CONTRAST  Result Date: 11/15/2022 CLINICAL DATA:  Acute infarct in the left pons on recent MRI head EXAM: MRA HEAD WITHOUT CONTRAST TECHNIQUE: Angiographic images of the Circle of Willis were acquired using MRA technique without intravenous contrast. COMPARISON:  MRI head 11/14/2022, CTA head 12/29/2021 FINDINGS: Anterior circulation: Both internal carotid arteries are patent to the termini, without  significant stenosis. A1 segments patent. Normal anterior communicating artery. Anterior cerebral arteries are patent to their distal aspects. No M1 stenosis or occlusion. Normal MCA bifurcations. Distal MCA branches perfused and symmetric. Posterior circulation: Vertebral arteries patent to the vertebrobasilar junction without stenosis. Posterior inferior cerebral arteries patent bilaterally. Basilar patent to its distal aspect. Superior cerebellar arteries patent bilaterally. Patent P1 segments. PCAs perfused to their distal aspects without stenosis. The bilateral posterior communicating arteries are not visualized. Anatomic variants: None significant IMPRESSION: No intracranial large vessel occlusion or significant stenosis. Electronically Signed   By: Merilyn Baba M.D.   On: 11/15/2022 01:06   DG CHEST PORT 1 VIEW  Result Date: 11/14/2022 CLINICAL DATA:  Leukocytosis EXAM: PORTABLE CHEST 1 VIEW COMPARISON:  12/29/2021 FINDINGS: Single frontal view of the chest demonstrates an unremarkable cardiac silhouette. No airspace disease, effusion, or pneumothorax. No acute bony abnormalities. IMPRESSION: 1. No acute intrathoracic process. Electronically Signed   By: Randa Ngo M.D.   On: 11/14/2022 15:53   US RENAL  Result Date: 11/14/2022 CLINICAL DATA:  AKI EXAM: RENAL / URINARY TRACT ULTRASOUND COMPLETE COMPARISON:  CT abdomen/pelvis 10/27/2019 FINDINGS: Right Kidney: Renal measurements: 9.2 cm x 4.0 cm x 5.4 cm = volume: 105 mL. Parenchymal  echogenicity is increased. No mass or hydronephrosis visualized. Left Kidney: Renal measurements: 9.5 cm x 6.6 cm x 6.0 cm = volume: 195 mL. Parenchymal echogenicity is increased. No mass or hydronephrosis visualized. Bladder: Appears normal for degree of bladder distention. Other: None. IMPRESSION: Increased cortical echogenicity bilaterally suggestive of medical renal disease. No hydronephrosis. Electronically Signed   By: Valetta Mole M.D.   On: 11/14/2022 15:13    MR BRAIN WO CONTRAST  Result Date: 11/14/2022 EXAM: MRI HEAD WITHOUT CONTRAST TECHNIQUE: Multiplanar, multiecho pulse sequences of the brain and surrounding structures were obtained without intravenous contrast. COMPARISON:  Same day CT head. FINDINGS: Brain: Small acute infarct in the left pons. No significant edema or mass effect. No evidence of acute hemorrhage, mass lesion, midline shift or hydrocephalus. Patchy white matter T2/FLAIR hyperintensities, nonspecific but compatible with chronic microvascular ischemic disease. Vascular: Major arterial flow voids are maintained at the skull base. Skull and upper cervical spine: Normal marrow signal. Sinuses/Orbits: Mild paranasal sinus mucosal thickening. Remote left medial orbital wall fracture. Other: No sizable mastoid effusions. IMPRESSION: Small acute infarct in the left pons. Electronically Signed   By: Margaretha Sheffield M.D.   On: 11/14/2022 11:40   CT HEAD CODE STROKE WO CONTRAST  Result Date: 11/14/2022 CLINICAL DATA:  Code stroke.  Neuro deficit, acute, stroke suspected EXAM: CT HEAD WITHOUT CONTRAST TECHNIQUE: Contiguous axial images were obtained from the base of the skull through the vertex without intravenous contrast. RADIATION DOSE REDUCTION: This exam was performed according to the departmental dose-optimization program which includes automated exposure control, adjustment of the mA and/or kV according to patient size and/or use of iterative reconstruction technique. COMPARISON:  None Available. FINDINGS: Brain: No evidence of acute large vascular territory infarction, hemorrhage, hydrocephalus, extra-axial collection or mass lesion/mass effect. Vascular: No hyperdense vessel identified. Skull: No acute fracture. Sinuses/Orbits: Remote left medial orbital wall fracture. No acute orbital findings. Other: No mastoid effusions. ASPECTS [facility] Stroke Program Early CT Score) total score (0-10 with 10 being normal): 10. IMPRESSION: 1. No evidence  of acute large vascular territory infarct or acute hemorrhage. ASPECTS is 10. 2. Remote left basal ganglia perforator infarct. Code stroke imaging results were communicated on 11/14/2022 at 10:30 am to provider stack via secure text paging. Electronically Signed   By: Margaretha Sheffield M.D.   On: 11/14/2022 10:30    PHYSICAL EXAM  Physical Exam  Constitutional: Appears well-developed and well-nourished.   Cardiovascular: Normal rate and regular rhythm.  Respiratory: Effort normal, non-labored breathing  Neuro: Mental Status: Patient is awake, alert, oriented to person, place, month, year, and situation. Patient is able to give a clear and coherent history. Speech is slightly dysarthric Cranial Nerves: II: Visual Fields are full. Pupils are equal, round, and reactive to light.   III,IV, VI: EOMI without ptosis or diploplia.  V: Facial sensation is symmetric to temperature VII: Left nasolabial fold flattening VIII: Hearing is intact to voice X: Palate elevates symmetrically XI: Shoulder shrug is symmetric. XII: Tongue protrudes midline without atrophy or fasciculations.  Motor: Tone is normal. Bulk is normal.  Generalized weakness in bilateral upper and lower extremities Sensory: Sensation is symmetric to light touch and temperature in the arms and legs.  Cerebellar: Dysmetria with right lower extremity HKS Right hand grasp weak   ASSESSMENT/PLAN Stacey Lucas is a 47 y.o. female with history of previous stroke in 2023 (L basal ganglia), anxiety/depression/suicidal ideation, diabetes, hyperlipidemia, hypertension, migraines, cocaine abuse who presents as a code stroke today after arriving  to a doctor's appointment this morning and exhibiting increased right-sided weakness and slurred speech.   Stroke:  Left pontine infarct, likely small vessel disease in the setting of uncontrolled risk factors and cocaine use  Code Stroke CT head No acute abnormality. ASPECTS 10.  MRI  Small  acute infarct in the left pons MRA  No LVO or significant stenosis  2D Echo EF 60-65%, left atria severely dilated LDL 99 HgbA1c 5.6 VTE prophylaxis -Lovenox aspirin 81 mg daily prior to admission, now on aspirin 81 mg daily and clopidogrel 75 mg daily for 3 weeks and then plavix alone.   Medication compliance education provided. Therapy recommendations: Pending Disposition: pending  History of stroke 05/2021 admitted for acute right caudate infarct, MRI also showed old left BG/CR infarct.  CTA head and neck unremarkable.  EF 60 to 65%, LDL 245, A1c 8.6.  Discharged on DAPT and Crestor. 12/2021 admitted for left CR/BG infarct status post TNK.  CT head and neck showed possible left M2 moderate to severe stenosis.  EF 70 to 75%.  LDL 117, A1c 7.1.  Discharged on DAPT and Crestor 20.  Hypertension Home meds: Norvasc, irbesartan, metoprolol succinate Stable Long-term BP goal normotensive  Hyperlipidemia Home meds: Crestor 20 mg LDL 99, goal < 70 Increase Crestor to 40 Continue statin at discharge  Diabetes type II Controlled Home meds: Insulin, glimepiride HgbA1c 5.6, goal < 7.0 CBGs SSI  AKI with CKD nephrology consulted Renal ultrasound shows evidence of chronic kidney disease Cr 4.90-4.28-4.36 On IV fluid  Tobacco abuse Current smoker Smoking cessation counseling provided Pt is willing to quit  Cocaine abuse UDS positive for cocaine every admission Cessation education again provided Patient is willing to create  Other Stroke Risk Factors ETOH use, alcohol level <10, advised to drink no more than 1 drink(s) a day Migraine  Other Active Problems Hypokalemia-replaced per primary team Leukocytosis-resolved WBC 9.8  Hospital day # 0  Patient seen and examined by NP/APP with MD. MD to update note as needed.   Janine Ores, DNP, FNP-BC [facility] Pager: [phone]  ATTENDING NOTE: I reviewed above note and agree with the assessment and plan. Pt  was seen and examined.   Patient admitted again for small vessel type lacunar infarct.  Patient has at least 2 previous admission for small vessel type lacunar infarcts, and an readmission showing possible cocaine.  Patient also has other stroke risk factors including hypertension, hyperlipidemia, diabetes, migraine, smoker.  Therefore aggressive stroke risk factor modification is key for secondary stroke prevention.  Currently on DAPT for 3 weeks and then Plavix alone.  Increase Crestor from 10-20.  Nephrology on board for AKI on CKD.  PT/OT pending.  For detailed assessment and plan, please refer to above/below as I have made changes wherever appropriate.   Neurology will sign off. Please call with questions. Pt will follow up with Dr. Leta Baptist at [facility] in about 4 weeks. Thanks for the consult.   Rosalin Hawking, MD PhD Stroke Neurology 11/15/2022 4:58 PM    To contact Stroke Continuity provider, please refer to [email or web address] After hours, contact General Neurology

## 2022-11-15 NOTE — Progress Notes (Signed)
Echocardiogram 2D Echocardiogram has been performed.  Frances Furbish 11/15/2022, 1:35 PM

## 2022-11-16 ENCOUNTER — Observation Stay (HOSPITAL_COMMUNITY): Payer: BC Managed Care – PPO

## 2022-11-16 DIAGNOSIS — M7989 Other specified soft tissue disorders: Secondary | ICD-10-CM | POA: Diagnosis not present

## 2022-11-16 DIAGNOSIS — D6869 Other thrombophilia: Secondary | ICD-10-CM | POA: Diagnosis present

## 2022-11-16 DIAGNOSIS — Z79899 Other long term (current) drug therapy: Secondary | ICD-10-CM | POA: Diagnosis not present

## 2022-11-16 DIAGNOSIS — F17210 Nicotine dependence, cigarettes, uncomplicated: Secondary | ICD-10-CM | POA: Diagnosis present

## 2022-11-16 DIAGNOSIS — E876 Hypokalemia: Secondary | ICD-10-CM | POA: Diagnosis present

## 2022-11-16 DIAGNOSIS — Z23 Encounter for immunization: Secondary | ICD-10-CM | POA: Diagnosis not present

## 2022-11-16 DIAGNOSIS — T45526A Underdosing of antithrombotic drugs, initial encounter: Secondary | ICD-10-CM | POA: Diagnosis present

## 2022-11-16 DIAGNOSIS — E8809 Other disorders of plasma-protein metabolism, not elsewhere classified: Secondary | ICD-10-CM | POA: Diagnosis present

## 2022-11-16 DIAGNOSIS — F1410 Cocaine abuse, uncomplicated: Secondary | ICD-10-CM | POA: Diagnosis present

## 2022-11-16 DIAGNOSIS — I6329 Cerebral infarction due to unspecified occlusion or stenosis of other precerebral arteries: Secondary | ICD-10-CM | POA: Diagnosis present

## 2022-11-16 DIAGNOSIS — J9811 Atelectasis: Secondary | ICD-10-CM | POA: Diagnosis not present

## 2022-11-16 DIAGNOSIS — I69351 Hemiplegia and hemiparesis following cerebral infarction affecting right dominant side: Secondary | ICD-10-CM | POA: Diagnosis not present

## 2022-11-16 DIAGNOSIS — D72829 Elevated white blood cell count, unspecified: Secondary | ICD-10-CM | POA: Diagnosis present

## 2022-11-16 DIAGNOSIS — E1122 Type 2 diabetes mellitus with diabetic chronic kidney disease: Secondary | ICD-10-CM | POA: Diagnosis present

## 2022-11-16 DIAGNOSIS — E1165 Type 2 diabetes mellitus with hyperglycemia: Secondary | ICD-10-CM | POA: Diagnosis present

## 2022-11-16 DIAGNOSIS — N0429 Other nephrotic syndrome with diffuse membranous glomerulonephritis: Secondary | ICD-10-CM | POA: Diagnosis not present

## 2022-11-16 DIAGNOSIS — F32A Depression, unspecified: Secondary | ICD-10-CM | POA: Diagnosis present

## 2022-11-16 DIAGNOSIS — N049 Nephrotic syndrome with unspecified morphologic changes: Secondary | ICD-10-CM | POA: Diagnosis not present

## 2022-11-16 DIAGNOSIS — D631 Anemia in chronic kidney disease: Secondary | ICD-10-CM | POA: Diagnosis present

## 2022-11-16 DIAGNOSIS — I1310 Hypertensive heart and chronic kidney disease without heart failure, with stage 1 through stage 4 chronic kidney disease, or unspecified chronic kidney disease: Secondary | ICD-10-CM | POA: Diagnosis present

## 2022-11-16 DIAGNOSIS — K219 Gastro-esophageal reflux disease without esophagitis: Secondary | ICD-10-CM | POA: Diagnosis present

## 2022-11-16 DIAGNOSIS — N179 Acute kidney failure, unspecified: Secondary | ICD-10-CM | POA: Diagnosis not present

## 2022-11-16 DIAGNOSIS — N189 Chronic kidney disease, unspecified: Secondary | ICD-10-CM | POA: Diagnosis not present

## 2022-11-16 DIAGNOSIS — N1831 Chronic kidney disease, stage 3a: Secondary | ICD-10-CM | POA: Diagnosis present

## 2022-11-16 DIAGNOSIS — R471 Dysarthria and anarthria: Secondary | ICD-10-CM | POA: Diagnosis present

## 2022-11-16 DIAGNOSIS — E872 Acidosis, unspecified: Secondary | ICD-10-CM | POA: Diagnosis present

## 2022-11-16 DIAGNOSIS — I639 Cerebral infarction, unspecified: Secondary | ICD-10-CM | POA: Diagnosis present

## 2022-11-16 DIAGNOSIS — R29703 NIHSS score 3: Secondary | ICD-10-CM | POA: Diagnosis present

## 2022-11-16 DIAGNOSIS — R29810 Facial weakness: Secondary | ICD-10-CM | POA: Diagnosis present

## 2022-11-16 DIAGNOSIS — R0602 Shortness of breath: Secondary | ICD-10-CM | POA: Diagnosis not present

## 2022-11-16 DIAGNOSIS — F419 Anxiety disorder, unspecified: Secondary | ICD-10-CM | POA: Diagnosis present

## 2022-11-16 DIAGNOSIS — Z0389 Encounter for observation for other suspected diseases and conditions ruled out: Secondary | ICD-10-CM | POA: Diagnosis not present

## 2022-11-16 LAB — CBC WITH DIFFERENTIAL/PLATELET
Abs Immature Granulocytes: 0.04 10*3/uL (ref 0.00–0.07)
Basophils Absolute: 0 10*3/uL (ref 0.0–0.1)
Basophils Relative: 1 %
Eosinophils Absolute: 0.3 10*3/uL (ref 0.0–0.5)
Eosinophils Relative: 3 %
HCT: 22.3 % — ABNORMAL LOW (ref 36.0–46.0)
Hemoglobin: 7.5 g/dL — ABNORMAL LOW (ref 12.0–15.0)
Immature Granulocytes: 1 %
Lymphocytes Relative: 27 %
Lymphs Abs: 2.3 10*3/uL (ref 0.7–4.0)
MCH: 24.9 pg — ABNORMAL LOW (ref 26.0–34.0)
MCHC: 33.6 g/dL (ref 30.0–36.0)
MCV: 74.1 fL — ABNORMAL LOW (ref 80.0–100.0)
Monocytes Absolute: 0.9 10*3/uL (ref 0.1–1.0)
Monocytes Relative: 10 %
Neutro Abs: 5.1 10*3/uL (ref 1.7–7.7)
Neutrophils Relative %: 58 %
Platelets: 171 10*3/uL (ref 150–400)
RBC: 3.01 MIL/uL — ABNORMAL LOW (ref 3.87–5.11)
RDW: 16.3 % — ABNORMAL HIGH (ref 11.5–15.5)
WBC: 8.6 10*3/uL (ref 4.0–10.5)
nRBC: 0 % (ref 0.0–0.2)

## 2022-11-16 LAB — RENAL FUNCTION PANEL
Albumin: <1.5 g/dL — ABNORMAL LOW (ref 3.5–5.0)
Anion gap: 9 (ref 5–15)
BUN: 44 mg/dL — ABNORMAL HIGH (ref 6–20)
CO2: 19 mmol/L — ABNORMAL LOW (ref 22–32)
Calcium: 7.6 mg/dL — ABNORMAL LOW (ref 8.9–10.3)
Chloride: 111 mmol/L (ref 98–111)
Creatinine, Ser: 4.59 mg/dL — ABNORMAL HIGH (ref 0.44–1.00)
GFR, Estimated: 11 mL/min — ABNORMAL LOW (ref >60)
Glucose, Bld: 190 mg/dL — ABNORMAL HIGH (ref 70–99)
Phosphorus: 5.3 mg/dL — ABNORMAL HIGH (ref 2.5–4.6)
Potassium: 4.2 mmol/L (ref 3.5–5.1)
Sodium: 139 mmol/L (ref 135–145)

## 2022-11-16 LAB — HEPARIN LEVEL (UNFRACTIONATED): Heparin Unfractionated: 0.16 IU/mL — ABNORMAL LOW (ref 0.30–0.70)

## 2022-11-16 LAB — TROPONIN I (HIGH SENSITIVITY)
Troponin I (High Sensitivity): 14 ng/L (ref <18)
Troponin I (High Sensitivity): 13 ng/L (ref <18)

## 2022-11-16 LAB — GLUCOSE, CAPILLARY
Glucose-Capillary: 164 mg/dL — ABNORMAL HIGH (ref 70–99)
Glucose-Capillary: 159 mg/dL — ABNORMAL HIGH (ref 70–99)
Glucose-Capillary: 122 mg/dL — ABNORMAL HIGH (ref 70–99)
Glucose-Capillary: 261 mg/dL — ABNORMAL HIGH (ref 70–99)

## 2022-11-16 MED ORDER — AMLODIPINE BESYLATE 10 MG PO TABS
10.0000 mg | ORAL_TABLET | Freq: Every day | ORAL | Status: DC
  Administered 2022-11-17 – 2022-11-21 (×5): 10 mg via ORAL
  Filled 2022-11-16 (×6): qty 1

## 2022-11-16 MED ORDER — HEPARIN (PORCINE) 25000 UT/250ML-% IV SOLN
1350.0000 [IU]/h | INTRAVENOUS | Status: DC
  Administered 2022-11-16: 900 [IU]/h via INTRAVENOUS
  Administered 2022-11-17 – 2022-11-19 (×3): 1200 [IU]/h via INTRAVENOUS
  Administered 2022-11-20: 1350 [IU]/h via INTRAVENOUS
  Filled 2022-11-16 (×5): qty 250

## 2022-11-16 MED ORDER — TECHNETIUM TO 99M ALBUMIN AGGREGATED
3.8000 | Freq: Once | INTRAVENOUS | Status: AC | PRN
  Administered 2022-11-16: 3.8 via INTRAVENOUS

## 2022-11-16 MED ORDER — DARBEPOETIN ALFA 60 MCG/0.3ML IJ SOSY
60.0000 ug | PREFILLED_SYRINGE | INTRAMUSCULAR | Status: DC
  Administered 2022-11-16: 60 ug via SUBCUTANEOUS
  Filled 2022-11-16: qty 0.3

## 2022-11-16 NOTE — Progress Notes (Signed)
[facility] Progress Note   Assessment/ Plan:   47 year old female with history of CVA, diabetes, and hypertension admitted for acute left pontine infarct, found to have severe AKI with proteinuria, hypoalbuminemia, and edema suggestive of nephrotic syndrome.   Nephrotic syndrome with AKI on CKD Suspect nephrotic syndrome in this person with severe hypoalbuminemia and edema proteinuria with 24-hour protein.   HIV screen negative.   HCV and HBV serologies pending.   Complement levels pending.   Myeloma workup also pending.  I note proteinuria by dipstick dating back to at least 2018 suggesting a degree of renal disease, so it's also possible that she is experiencing decompensation of chronic renal disease due to poorly controlled diabetes or hypertension.  Pending serology and quantification of proteinuria, will make a decision about whether or not to biopsy this patient's kidneys.  Edematous state less likely due to heart failure with only mild diastolic dysfunction on echo.  No overt signs of liver disease on exam, making decompensated cirrhosis less likely. - hold Lasix today, anticipate putting on orals tomorrow     Acute left pontine infarct Young, but risk factors for ASCVD include poorly controlled diabetes, hypertension, and hyperlipidemia.  She also has cocaine use disorder.  However given the high index of suspicion for nephrotic syndrome and her severe hypoalbuminemic state, I wonder about hypercoagulability.  Additionally she has LA dilatation on her echo which hints at A-fib that could further increase her risk for embolic stroke. Before starting anticoagulation I want to see results of urine protein and serology studies which will guide decision to perform renal biopsy.   Anemia Iron replete. Low reticulocyte index. Suspect due to renal disease. - order ESA today   Diabetes Insulin per primary team.   Hypertension BP management per primary team.  Subjective:     Seen in room.  Cr slightly higher than previous.  Swelling improved.     Objective:   BP (!) 159/79 (BP Location: Left Arm)   Pulse 78   Temp 98.6 F (37 C) (Oral)   Resp 18   Ht '5\' 5"'$  (1.651 m)   Wt 72 kg   LMP 10/11/2019   SpO2 98%   BMI 26.41 kg/m   Intake/Output Summary (Last 24 hours) at 11/16/2022 [id number] Last data filed at 11/15/2022 2340 Gross per 24 hour  Intake 357 ml  Output 250 ml  Net 107 ml   Weight change:   Physical Exam: Gen:NAD, sittnig in bed CVS:RRR Resp: clear Abd: soft Ext: trace LE edema Neuro: + dysarthria  Imaging: ECHOCARDIOGRAM COMPLETE  Result Date: 11/15/2022    ECHOCARDIOGRAM REPORT   Patient Name:   Stacey Lucas Date of Exam: 11/15/2022 Medical Rec #:  [id number]    Height:       65.0 in Accession #:    [id number]   Weight:       158.7 lb Date of Birth:  21-Oct-1975   BSA:          1.793 m Patient Age:    49 years     BP:           143/79 mmHg Patient Gender: F            HR:           81 bpm. Exam Location:  Inpatient Procedure: 2D Echo, Cardiac Doppler and Color Doppler Indications:    stroke  History:        Patient has prior history of Echocardiogram examinations. Risk  Factors:Hypertension, Diabetes and Dyslipidemia.  Sonographer:    Phineas Douglas Referring Phys: [id number] [place]  1. Left ventricular ejection fraction, by estimation, is 60 to 65%. The left ventricle has normal function. The left ventricle has no regional wall motion abnormalities. Left ventricular diastolic parameters are consistent with Grade I diastolic dysfunction (impaired relaxation).  2. Right ventricular systolic function is normal. The right ventricular size is normal. There is normal pulmonary artery systolic pressure. The estimated right ventricular systolic pressure is [id number] mmHg.  3. Left atrial size was severely dilated.  4. The mitral valve is normal in structure. No evidence of mitral valve regurgitation. No evidence of mitral stenosis.  5.  The aortic valve is tricuspid. Aortic valve regurgitation is not visualized. No aortic stenosis is present.  6. The inferior vena cava is normal in size with greater than 50% respiratory variability, suggesting right atrial pressure of 3 mmHg. FINDINGS  Left Ventricle: Left ventricular ejection fraction, by estimation, is 60 to 65%. The left ventricle has normal function. The left ventricle has no regional wall motion abnormalities. The left ventricular internal cavity size was normal in size. There is  no left ventricular hypertrophy. Left ventricular diastolic parameters are consistent with Grade I diastolic dysfunction (impaired relaxation). Right Ventricle: The right ventricular size is normal. No increase in right ventricular wall thickness. Right ventricular systolic function is normal. There is normal pulmonary artery systolic pressure. The tricuspid regurgitant velocity is 1.98 m/s, and  with an assumed right atrial pressure of 3 mmHg, the estimated right ventricular systolic pressure is [id number] mmHg. Left Atrium: Left atrial size was severely dilated. Right Atrium: Right atrial size was normal in size. Pericardium: Trivial pericardial effusion is present. Mitral Valve: The mitral valve is normal in structure. There is mild calcification of the mitral valve leaflet(s). Mild mitral annular calcification. No evidence of mitral valve regurgitation. No evidence of mitral valve stenosis. Tricuspid Valve: The tricuspid valve is normal in structure. Tricuspid valve regurgitation is trivial. Aortic Valve: The aortic valve is tricuspid. Aortic valve regurgitation is not visualized. No aortic stenosis is present. Pulmonic Valve: The pulmonic valve was normal in structure. Pulmonic valve regurgitation is not visualized. Aorta: The aortic root is normal in size and structure. Venous: The inferior vena cava is normal in size with greater than 50% respiratory variability, suggesting right atrial pressure of 3 mmHg.  IAS/Shunts: No atrial level shunt detected by color flow Doppler.  LEFT VENTRICLE PLAX 2D LVIDd:         4.40 cm      Diastology LVIDs:         2.50 cm      LV e' medial:    7.62 cm/s LV PW:         1.30 cm      LV E/e' medial:  11.9 LV IVS:        1.00 cm      LV e' lateral:   9.79 cm/s LVOT diam:     2.00 cm      LV E/e' lateral: 9.3 LV SV:         78 LV SV Index:   43 LVOT Area:     3.14 cm  LV Volumes (MOD) LV vol d, MOD A2C: 113.0 ml LV vol d, MOD A4C: 106.0 ml LV vol s, MOD A2C: 38.0 ml LV vol s, MOD A4C: 43.5 ml LV SV MOD A2C:     75.0 ml LV SV MOD A4C:  106.0 ml LV SV MOD BP:      72.4 ml RIGHT VENTRICLE             IVC RV Basal diam:  3.60 cm     IVC diam: 1.90 cm RV S prime:     12.10 cm/s TAPSE (M-mode): 2.4 cm LEFT ATRIUM              Index        RIGHT ATRIUM           Index LA diam:        3.60 cm  2.01 cm/m   RA Area:     14.90 cm LA Vol (A2C):   102.0 ml 56.89 ml/m  RA Volume:   37.90 ml  21.14 ml/m LA Vol (A4C):   90.0 ml  50.19 ml/m LA Biplane Vol: 98.8 ml  55.10 ml/m  AORTIC VALVE LVOT Vmax:   104.00 cm/s LVOT Vmean:  67.700 cm/s LVOT VTI:    0.247 m  AORTA Ao Root diam: 2.90 cm Ao Asc diam:  3.00 cm MITRAL VALVE                TRICUSPID VALVE MV Area (PHT): 4.33 cm     TR Peak grad:   15.7 mmHg MV Decel Time: 175 msec     TR Vmax:        198.00 cm/s MV E velocity: 90.80 cm/s MV A velocity: 100.00 cm/s  SHUNTS MV E/A ratio:  0.91         Systemic VTI:  0.25 m                             Systemic Diam: 2.00 cm Dalton McleanMD Electronically signed by Franki Monte Signature Date/Time: 11/15/2022/2:39:16 PM    Final    VAS US CAROTID  Result Date: 11/15/2022 Carotid Arterial Duplex Study Patient Name:  ANTIA BERISH  Date of Exam:   11/15/2022 Medical Rec #: [id number]     Accession #:    [id number] Date of Birth: Oct 28, 1975    Patient Gender: F Patient Age:   76 years Exam Location:  [facility] Procedure:      VAS US CAROTID Referring Phys: Rosalin Hawking  --------------------------------------------------------------------------------  Performing Technologist: Darlin Coco RDMS, RVT  Examination Guidelines: A complete evaluation includes B-mode imaging, spectral Doppler, color Doppler, and power Doppler as needed of all accessible portions of each vessel. Bilateral testing is considered an integral part of a complete examination. Limited examinations for reoccurring indications may be performed as noted.  Right Carotid Findings: +----------+--------+--------+--------+------------------+--------+           PSV cm/sEDV cm/sStenosisPlaque DescriptionComments +----------+--------+--------+--------+------------------+--------+ CCA Prox  68      14                                         +----------+--------+--------+--------+------------------+--------+ CCA Distal54      14              focal mild                 +----------+--------+--------+--------+------------------+--------+ ICA Prox  54      21                                         +----------+--------+--------+--------+------------------+--------+  ICA Mid   52      15                                         +----------+--------+--------+--------+------------------+--------+ ICA Distal29      9                                          +----------+--------+--------+--------+------------------+--------+ ECA       92      17                                         +----------+--------+--------+--------+------------------+--------+ +----------+--------+-------+--------+-------------------+           PSV cm/sEDV cmsDescribeArm Pressure (mmHG) +----------+--------+-------+--------+-------------------+ [id number]                                         +----------+--------+-------+--------+-------------------+ +---------+--------+--+--------+--+ VertebralPSV cm/s95EDV cm/s26 +---------+--------+--+--------+--+  Left Carotid Findings:  +----------+--------+--------+--------+------------------+--------+           PSV cm/sEDV cm/sStenosisPlaque DescriptionComments +----------+--------+--------+--------+------------------+--------+ CCA Prox  91      16                                         +----------+--------+--------+--------+------------------+--------+ CCA Distal77      17                                         +----------+--------+--------+--------+------------------+--------+ ICA Prox  50      17                                         +----------+--------+--------+--------+------------------+--------+ ICA Mid   135     41                                tortuous +----------+--------+--------+--------+------------------+--------+ ICA Distal100     31                                         +----------+--------+--------+--------+------------------+--------+ ECA       76      14                                         +----------+--------+--------+--------+------------------+--------+ +----------+--------+--------+--------+-------------------+           PSV cm/sEDV cm/sDescribeArm Pressure (mmHG) +----------+--------+--------+--------+-------------------+ [id number]                                         +----------+--------+--------+--------+-------------------+ +---------+--------+--+--------+--+ VertebralPSV  cm/s70EDV cm/s21 +---------+--------+--+--------+--+   Summary: Right Carotid: The extracranial vessels were near-normal with only minimal wall                thickening or plaque. Left Carotid: The extracranial vessels were near-normal with only minimal wall               thickening or plaque. Vertebrals:  Bilateral vertebral arteries demonstrate antegrade flow. Subclavians: Normal flow hemodynamics were seen in bilateral subclavian              arteries. *See table(s) above for measurements and observations.     Preliminary    MR ANGIO HEAD WO CONTRAST  Result  Date: 11/15/2022 CLINICAL DATA:  Acute infarct in the left pons on recent MRI head EXAM: MRA HEAD WITHOUT CONTRAST TECHNIQUE: Angiographic images of the Circle of Willis were acquired using MRA technique without intravenous contrast. COMPARISON:  MRI head 11/14/2022, CTA head 12/29/2021 FINDINGS: Anterior circulation: Both internal carotid arteries are patent to the termini, without significant stenosis. A1 segments patent. Normal anterior communicating artery. Anterior cerebral arteries are patent to their distal aspects. No M1 stenosis or occlusion. Normal MCA bifurcations. Distal MCA branches perfused and symmetric. Posterior circulation: Vertebral arteries patent to the vertebrobasilar junction without stenosis. Posterior inferior cerebral arteries patent bilaterally. Basilar patent to its distal aspect. Superior cerebellar arteries patent bilaterally. Patent P1 segments. PCAs perfused to their distal aspects without stenosis. The bilateral posterior communicating arteries are not visualized. Anatomic variants: None significant IMPRESSION: No intracranial large vessel occlusion or significant stenosis. Electronically Signed   By: Merilyn Baba M.D.   On: 11/15/2022 01:06   DG CHEST PORT 1 VIEW  Result Date: 11/14/2022 CLINICAL DATA:  Leukocytosis EXAM: PORTABLE CHEST 1 VIEW COMPARISON:  12/29/2021 FINDINGS: Single frontal view of the chest demonstrates an unremarkable cardiac silhouette. No airspace disease, effusion, or pneumothorax. No acute bony abnormalities. IMPRESSION: 1. No acute intrathoracic process. Electronically Signed   By: Randa Ngo M.D.   On: 11/14/2022 15:53   US RENAL  Result Date: 11/14/2022 CLINICAL DATA:  AKI EXAM: RENAL / URINARY TRACT ULTRASOUND COMPLETE COMPARISON:  CT abdomen/pelvis 10/27/2019 FINDINGS: Right Kidney: Renal measurements: 9.2 cm x 4.0 cm x 5.4 cm = volume: 105 mL. Parenchymal echogenicity is increased. No mass or hydronephrosis visualized. Left Kidney: Renal  measurements: 9.5 cm x 6.6 cm x 6.0 cm = volume: 195 mL. Parenchymal echogenicity is increased. No mass or hydronephrosis visualized. Bladder: Appears normal for degree of bladder distention. Other: None. IMPRESSION: Increased cortical echogenicity bilaterally suggestive of medical renal disease. No hydronephrosis. Electronically Signed   By: Valetta Mole M.D.   On: 11/14/2022 15:13   MR BRAIN WO CONTRAST  Result Date: 11/14/2022 EXAM: MRI HEAD WITHOUT CONTRAST TECHNIQUE: Multiplanar, multiecho pulse sequences of the brain and surrounding structures were obtained without intravenous contrast. COMPARISON:  Same day CT head. FINDINGS: Brain: Small acute infarct in the left pons. No significant edema or mass effect. No evidence of acute hemorrhage, mass lesion, midline shift or hydrocephalus. Patchy white matter T2/FLAIR hyperintensities, nonspecific but compatible with chronic microvascular ischemic disease. Vascular: Major arterial flow voids are maintained at the skull base. Skull and upper cervical spine: Normal marrow signal. Sinuses/Orbits: Mild paranasal sinus mucosal thickening. Remote left medial orbital wall fracture. Other: No sizable mastoid effusions. IMPRESSION: Small acute infarct in the left pons. Electronically Signed   By: Margaretha Sheffield M.D.   On: 11/14/2022 11:40   CT HEAD CODE STROKE WO CONTRAST  Result Date: 11/14/2022 CLINICAL DATA:  Code stroke.  Neuro deficit, acute, stroke suspected EXAM: CT HEAD WITHOUT CONTRAST TECHNIQUE: Contiguous axial images were obtained from the base of the skull through the vertex without intravenous contrast. RADIATION DOSE REDUCTION: This exam was performed according to the departmental dose-optimization program which includes automated exposure control, adjustment of the mA and/or kV according to patient size and/or use of iterative reconstruction technique. COMPARISON:  None Available. FINDINGS: Brain: No evidence of acute large vascular territory  infarction, hemorrhage, hydrocephalus, extra-axial collection or mass lesion/mass effect. Vascular: No hyperdense vessel identified. Skull: No acute fracture. Sinuses/Orbits: Remote left medial orbital wall fracture. No acute orbital findings. Other: No mastoid effusions. ASPECTS [facility] Stroke Program Early CT Score) total score (0-10 with 10 being normal): 10. IMPRESSION: 1. No evidence of acute large vascular territory infarct or acute hemorrhage. ASPECTS is 10. 2. Remote left basal ganglia perforator infarct. Code stroke imaging results were communicated on 11/14/2022 at 10:30 am to provider stack via secure text paging. Electronically Signed   By: Margaretha Sheffield M.D.   On: 11/14/2022 10:30    Labs: BMET Recent Labs  Lab 11/14/22 1013 11/14/22 1014 11/15/22 0618 11/16/22 0352  NA 138 138 139 139  K 4.0 3.9 3.4* 4.2  CL 109 109 110 111  CO2  --  17* 22 19*  GLUCOSE 93 103* 144* 190*  BUN 37* 35* 40* 44*  CREATININE 4.90* 4.28* 4.36* 4.59*  CALCIUM  --  8.1* 7.6* 7.6*  PHOS  --   --   --  5.3*   CBC Recent Labs  Lab 11/14/22 1014 11/15/22 0618 11/15/22 1138 11/16/22 0352  WBC 16.0* 9.8  --  8.6  NEUTROABS 9.7* 5.3  --  5.1  HGB 9.8* 7.7* 8.0* 7.5*  HCT 29.5* 22.5* 23.8* 22.3*  MCV 76.2* 74.0*  --  74.1*  PLT 291 228  --  171    Medications:     aspirin EC  81 mg Oral Daily   buPROPion  150 mg Oral Daily   citalopram  10 mg Oral Daily   clopidogrel  75 mg Oral Daily   enoxaparin (LOVENOX) injection  30 mg Subcutaneous Q24H   insulin aspart  0-9 Units Subcutaneous TID WC   ketotifen  2 drop Both Eyes BID   rosuvastatin  40 mg Oral Daily    Madelon Lips MD 11/16/2022, 7:06 AM

## 2022-11-16 NOTE — Progress Notes (Signed)
Left lower extremity venous duplex has been completed. Preliminary results can be found in CV Proc through chart review.   11/16/22 12:08 PM Stacey Lucas RVT

## 2022-11-16 NOTE — Progress Notes (Signed)
ANTICOAGULATION CONSULT NOTE  Pharmacy Consult for Heparin Indication:  nephrotic syndrome, acute CVA  Allergies  Allergen Reactions   Gabapentin     Weakness/impairs balance    Patient Measurements: Height: '5\' 5"'$  (165.1 cm) Weight: 72 kg (158 lb 11.7 oz) IBW/kg (Calculated) : 57 Heparin Dosing Weight: 71.5 kg  Vital Signs: Temp: 98.6 F (37 C) (03/09 1938) Temp Source: Oral (03/09 1938) BP: 158/79 (03/09 2155) Pulse Rate: 88 (03/09 1938)  Labs: Recent Labs    11/14/22 1014 11/14/22 1545 11/15/22 0618 11/15/22 1138 11/16/22 0352 11/16/22 1102 11/16/22 1255 11/16/22 2123  HGB 9.8*  --  7.7* 8.0* 7.5*  --   --   --   HCT 29.5*  --  22.5* 23.8* 22.3*  --   --   --   PLT 291  --  228  --  171  --   --   --   APTT 40*  --   --   --   --   --   --   --   LABPROT 13.0  --   --   --   --   --   --   --   INR 1.0  --   --   --   --   --   --   --   HEPARINUNFRC  --   --   --   --   --   --   --  0.16*  CREATININE 4.28*  --  4.36*  --  4.59*  --   --   --   CKTOTAL  --  349*  --   --   --   --   --   --   TROPONINIHS  --   --   --   --   --  14 13  --      Estimated Creatinine Clearance: 15.1 mL/min (A) (by C-G formula based on SCr of 4.59 mg/dL (H)).   Medical History: Past Medical History:  Diagnosis Date   Anxiety and depression    Cocaine abuse ([place])    Depression with suicidal ideation    [place] admission 04/2018   Diabetes mellitus without complication (HCC)    Type II   GERD (gastroesophageal reflux disease)    Hyperlipidemia    Hypertension    Impingement syndrome of right shoulder    Migraine headache    Osteoarthritis of right knee 05/24/2021   Pilonidal abscess 05/2013   Pyelonephritis    Right thyroid nodule    Sciatica     Medications:  Scheduled:   [START ON 11/17/2022] amLODipine  10 mg Oral Daily   buPROPion  150 mg Oral Daily   citalopram  10 mg Oral Daily   darbepoetin (ARANESP) injection - NON-DIALYSIS  60 mcg Subcutaneous Q Sat-1800    insulin aspart  0-9 Units Subcutaneous TID WC   ketotifen  2 drop Both Eyes BID   rosuvastatin  40 mg Oral Daily    heparin 900 Units/hr (11/16/22 1248)    Assessment: 47 yo female with PMH of prior CVAs, HTN, T2DM, now presenting with acute CVA and AKI. Patient was not on anticoagulation PTA. Patient was started on DAPT with aspirin and clopidogrel - neurology ok with discontinuing DAPT if starting anticoagulation. Baseline Hgb 7.5, plt wnl. Pharmacy consulted for heparin dosing.  Heparin level came back subtherapeutic at 0.16, on 900 units/hr. No s/sx of bleeding or infusion issues.   Goal of Therapy:  Heparin level 0.3-0.5 units/ml Monitor platelets by anticoagulation protocol: Yes   Plan:  Increase heparin infusion to 1050 units/hr  Check ~8 hr heparin level.  Daily CBC, heparin level. Monitor for signs/symptoms of bleeding.  Antonietta Jewel, PharmD, BCCCP Clinical Pharmacist  Phone: [phone] 11/16/2022 10:42 PM  Please check AMION for all [place] phone numbers After 10:00 PM, call [place] [phone]

## 2022-11-16 NOTE — Plan of Care (Signed)
  Problem: Education: Goal: Knowledge of disease or condition will improve Outcome: Progressing Goal: Knowledge of secondary prevention will improve (MUST DOCUMENT ALL) Outcome: Progressing Goal: Knowledge of patient specific risk factors will improve (Mark N/A or DELETE if not current risk factor) Outcome: Progressing   Problem: Ischemic Stroke/TIA Tissue Perfusion: Goal: Complications of ischemic stroke/TIA will be minimized Outcome: Progressing   Problem: Coping: Goal: Will verbalize positive feelings about self Outcome: Progressing Goal: Will identify appropriate support needs Outcome: Progressing   Problem: Health Behavior/Discharge Planning: Goal: Ability to manage health-related needs will improve Outcome: Progressing Goal: Goals will be collaboratively established with patient/family Outcome: Progressing   Problem: Self-Care: Goal: Ability to participate in self-care as condition permits will improve Outcome: Progressing Goal: Verbalization of feelings and concerns over difficulty with self-care will improve Outcome: Progressing Goal: Ability to communicate needs accurately will improve Outcome: Progressing   Problem: Nutrition: Goal: Risk of aspiration will decrease Outcome: Progressing Goal: Dietary intake will improve Outcome: Progressing   Problem: Education: Goal: Knowledge of General Education information will improve Description: Including pain rating scale, medication(s)/side effects and non-pharmacologic comfort measures Outcome: Progressing   Problem: Health Behavior/Discharge Planning: Goal: Ability to manage health-related needs will improve Outcome: Progressing   Problem: Clinical Measurements: Goal: Ability to maintain clinical measurements within normal limits will improve Outcome: Progressing Goal: Will remain free from infection Outcome: Progressing Goal: Diagnostic test results will improve Outcome: Progressing Goal: Respiratory  complications will improve Outcome: Progressing Goal: Cardiovascular complication will be avoided Outcome: Progressing   Problem: Activity: Goal: Risk for activity intolerance will decrease Outcome: Progressing   Problem: Nutrition: Goal: Adequate nutrition will be maintained Outcome: Progressing   Problem: Coping: Goal: Level of anxiety will decrease Outcome: Progressing   Problem: Elimination: Goal: Will not experience complications related to bowel motility Outcome: Progressing Goal: Will not experience complications related to urinary retention Outcome: Progressing   Problem: Pain Managment: Goal: General experience of comfort will improve Outcome: Progressing   Problem: Safety: Goal: Ability to remain free from injury will improve Outcome: Progressing   Problem: Skin Integrity: Goal: Risk for impaired skin integrity will decrease Outcome: Progressing   Problem: Education: Goal: Ability to describe self-care measures that may prevent or decrease complications (Diabetes Survival Skills Education) will improve Outcome: Progressing Goal: Individualized Educational Video(s) Outcome: Progressing   Problem: Coping: Goal: Ability to adjust to condition or change in health will improve Outcome: Progressing   Problem: Fluid Volume: Goal: Ability to maintain a balanced intake and output will improve Outcome: Progressing   Problem: Health Behavior/Discharge Planning: Goal: Ability to identify and utilize available resources and services will improve Outcome: Progressing Goal: Ability to manage health-related needs will improve Outcome: Progressing   Problem: Metabolic: Goal: Ability to maintain appropriate glucose levels will improve Outcome: Progressing   Problem: Nutritional: Goal: Maintenance of adequate nutrition will improve Outcome: Progressing Goal: Progress toward achieving an optimal weight will improve Outcome: Progressing   Problem: Skin  Integrity: Goal: Risk for impaired skin integrity will decrease Outcome: Progressing   Problem: Tissue Perfusion: Goal: Adequacy of tissue perfusion will improve Outcome: Progressing   

## 2022-11-16 NOTE — Progress Notes (Signed)
ANTICOAGULATION CONSULT NOTE - Initial Consult  Pharmacy Consult for Heparin Indication:  nephrotic syndrome, acute CVA  Allergies  Allergen Reactions   Gabapentin     Weakness/impairs balance    Patient Measurements: Height: '5\' 5"'$  (165.1 cm) Weight: 72 kg (158 lb 11.7 oz) IBW/kg (Calculated) : 57 Heparin Dosing Weight: 71.5 kg  Vital Signs: Temp: 98.4 F (36.9 C) (03/09 0739) Temp Source: Oral (03/09 0739) BP: 153/85 (03/09 0739) Pulse Rate: 76 (03/09 0739)  Labs: Recent Labs    11/14/22 1014 11/14/22 1545 11/15/22 0618 11/15/22 1138 11/16/22 0352 11/16/22 1102  HGB 9.8*  --  7.7* 8.0* 7.5*  --   HCT 29.5*  --  22.5* 23.8* 22.3*  --   PLT 291  --  228  --  171  --   APTT 40*  --   --   --   --   --   LABPROT 13.0  --   --   --   --   --   INR 1.0  --   --   --   --   --   CREATININE 4.28*  --  4.36*  --  4.59*  --   CKTOTAL  --  349*  --   --   --   --   TROPONINIHS  --   --   --   --   --  14    Estimated Creatinine Clearance: 15.1 mL/min (A) (by C-G formula based on SCr of 4.59 mg/dL (H)).   Medical History: Past Medical History:  Diagnosis Date   Anxiety and depression    Cocaine abuse ([place])    Depression with suicidal ideation    [place] admission 04/2018   Diabetes mellitus without complication (HCC)    Type II   GERD (gastroesophageal reflux disease)    Hyperlipidemia    Hypertension    Impingement syndrome of right shoulder    Migraine headache    Osteoarthritis of right knee 05/24/2021   Pilonidal abscess 05/2013   Pyelonephritis    Right thyroid nodule    Sciatica     Medications:  Scheduled:   buPROPion  150 mg Oral Daily   citalopram  10 mg Oral Daily   darbepoetin (ARANESP) injection - NON-DIALYSIS  60 mcg Subcutaneous Q Sat-1800   insulin aspart  0-9 Units Subcutaneous TID WC   ketotifen  2 drop Both Eyes BID   rosuvastatin  40 mg Oral Daily    Assessment: 47 yo female with PMH of prior CVAs, HTN, T2DM, now presenting with acute  CVA and AKI. Patient was not on anticoagulation PTA. Patient was started on DAPT with aspirin and clopidogrel - neurology ok with discontinuing DAPT if starting anticoagulation. Baseline Hgb 7.5, plt wnl. Pharmacy consulted for heparin dosing.  Goal of Therapy:  Heparin level 0.3-0.5 units/ml Monitor platelets by anticoagulation protocol: Yes   Plan:  No bolus. Start IV heparin at 900 units/hr. Check ~8 hr heparin level.  Daily CBC, heparin level. Monitor for signs/symptoms of bleeding.   Vance Peper, PharmD PGY-2 Pharmacy Resident Phone [phone] 11/16/2022 12:00 PM   Please check AMION for all [place] phone numbers After 10:00 PM, call [place] [phone]

## 2022-11-16 NOTE — Progress Notes (Signed)
STROKE TEAM PROGRESS NOTE   INTERVAL HISTORY Neurology asked for input on anticoagulation.    Patient is doing well.  She has lower extremity edema otherwise no new complaints.  Nephrology has seen the patient and due to nephrotic syndrome and hypoalbuminemia, they wanted to start anticoagulation.  From a stroke standpoint no need for both aspirin and Plavix if anticoagulation is started.   Vitals:   11/15/22 1937 11/15/22 2318 11/16/22 0335 11/16/22 0739  BP: 130/69 128/72 (!) 159/79 (!) 153/85  Pulse: 79 78 78 76  Resp: '19 19 18 20  '$ Temp: 98.3 F (36.8 C) 98.4 F (36.9 C) 98.6 F (37 C) 98.4 F (36.9 C)  TempSrc: Oral Oral Oral Oral  SpO2: 99% 97% 98% 100%  Weight:      Height:       CBC:  Recent Labs  Lab 11/15/22 0618 11/15/22 1138 11/16/22 0352  WBC 9.8  --  8.6  NEUTROABS 5.3  --  5.1  HGB 7.7* 8.0* 7.5*  HCT 22.5* 23.8* 22.3*  MCV 74.0*  --  74.1*  PLT 228  --  [id number]    Basic Metabolic Panel:  Recent Labs  Lab 11/15/22 0618 11/16/22 0352  NA 139 139  K 3.4* 4.2  CL 110 111  CO2 22 19*  GLUCOSE 144* 190*  BUN 40* 44*  CREATININE 4.36* 4.59*  CALCIUM 7.6* 7.6*  PHOS  --  5.3*    Lipid Panel:  Recent Labs  Lab 11/15/22 0618  CHOL 190  TRIG 205*  HDL 50  CHOLHDL 3.8  VLDL 41*  LDLCALC 99    HgbA1c:  Recent Labs  Lab 11/14/22 1014  HGBA1C 5.6    Urine Drug Screen:  Recent Labs  Lab 11/14/22 1902  LABOPIA NONE DETECTED  COCAINSCRNUR POSITIVE*  LABBENZ NONE DETECTED  AMPHETMU NONE DETECTED  THCU NONE DETECTED  LABBARB NONE DETECTED     Alcohol Level  Recent Labs  Lab 11/14/22 1003  ETH <10     IMAGING past 24 hours ECHOCARDIOGRAM COMPLETE  Result Date: 11/15/2022    ECHOCARDIOGRAM REPORT   Patient Name:   Stacey Lucas Date of Exam: 11/15/2022 Medical Rec #:  [id number]    Height:       65.0 in Accession #:    [id number]   Weight:       158.7 lb Date of Birth:  08/11/1976   BSA:          1.793 m Patient Age:    47 years      BP:           143/79 mmHg Patient Gender: F            HR:           81 bpm. Exam Location:  Inpatient Procedure: 2D Echo, Cardiac Doppler and Color Doppler Indications:    stroke  History:        Patient has prior history of Echocardiogram examinations. Risk                 Factors:Hypertension, Diabetes and Dyslipidemia.  Sonographer:    Phineas Douglas Referring Phys: [id number] [place]  1. Left ventricular ejection fraction, by estimation, is 60 to 65%. The left ventricle has normal function. The left ventricle has no regional wall motion abnormalities. Left ventricular diastolic parameters are consistent with Grade I diastolic dysfunction (impaired relaxation).  2. Right ventricular systolic function is normal. The right ventricular size is normal. There  is normal pulmonary artery systolic pressure. The estimated right ventricular systolic pressure is [id number] mmHg.  3. Left atrial size was severely dilated.  4. The mitral valve is normal in structure. No evidence of mitral valve regurgitation. No evidence of mitral stenosis.  5. The aortic valve is tricuspid. Aortic valve regurgitation is not visualized. No aortic stenosis is present.  6. The inferior vena cava is normal in size with greater than 50% respiratory variability, suggesting right atrial pressure of 3 mmHg. FINDINGS  Left Ventricle: Left ventricular ejection fraction, by estimation, is 60 to 65%. The left ventricle has normal function. The left ventricle has no regional wall motion abnormalities. The left ventricular internal cavity size was normal in size. There is  no left ventricular hypertrophy. Left ventricular diastolic parameters are consistent with Grade I diastolic dysfunction (impaired relaxation). Right Ventricle: The right ventricular size is normal. No increase in right ventricular wall thickness. Right ventricular systolic function is normal. There is normal pulmonary artery systolic pressure. The tricuspid regurgitant  velocity is 1.98 m/s, and  with an assumed right atrial pressure of 3 mmHg, the estimated right ventricular systolic pressure is [id number] mmHg. Left Atrium: Left atrial size was severely dilated. Right Atrium: Right atrial size was normal in size. Pericardium: Trivial pericardial effusion is present. Mitral Valve: The mitral valve is normal in structure. There is mild calcification of the mitral valve leaflet(s). Mild mitral annular calcification. No evidence of mitral valve regurgitation. No evidence of mitral valve stenosis. Tricuspid Valve: The tricuspid valve is normal in structure. Tricuspid valve regurgitation is trivial. Aortic Valve: The aortic valve is tricuspid. Aortic valve regurgitation is not visualized. No aortic stenosis is present. Pulmonic Valve: The pulmonic valve was normal in structure. Pulmonic valve regurgitation is not visualized. Aorta: The aortic root is normal in size and structure. Venous: The inferior vena cava is normal in size with greater than 50% respiratory variability, suggesting right atrial pressure of 3 mmHg. IAS/Shunts: No atrial level shunt detected by color flow Doppler.  LEFT VENTRICLE PLAX 2D LVIDd:         4.40 cm      Diastology LVIDs:         2.50 cm      LV e' medial:    7.62 cm/s LV PW:         1.30 cm      LV E/e' medial:  11.9 LV IVS:        1.00 cm      LV e' lateral:   9.79 cm/s LVOT diam:     2.00 cm      LV E/e' lateral: 9.3 LV SV:         78 LV SV Index:   43 LVOT Area:     3.14 cm  LV Volumes (MOD) LV vol d, MOD A2C: 113.0 ml LV vol d, MOD A4C: 106.0 ml LV vol s, MOD A2C: 38.0 ml LV vol s, MOD A4C: 43.5 ml LV SV MOD A2C:     75.0 ml LV SV MOD A4C:     106.0 ml LV SV MOD BP:      72.4 ml RIGHT VENTRICLE             IVC RV Basal diam:  3.60 cm     IVC diam: 1.90 cm RV S prime:     12.10 cm/s TAPSE (M-mode): 2.4 cm LEFT ATRIUM              Index  RIGHT ATRIUM           Index LA diam:        3.60 cm  2.01 cm/m   RA Area:     14.90 cm LA Vol (A2C):   102.0 ml  56.89 ml/m  RA Volume:   37.90 ml  21.14 ml/m LA Vol (A4C):   90.0 ml  50.19 ml/m LA Biplane Vol: 98.8 ml  55.10 ml/m  AORTIC VALVE LVOT Vmax:   104.00 cm/s LVOT Vmean:  67.700 cm/s LVOT VTI:    0.247 m  AORTA Ao Root diam: 2.90 cm Ao Asc diam:  3.00 cm MITRAL VALVE                TRICUSPID VALVE MV Area (PHT): 4.33 cm     TR Peak grad:   15.7 mmHg MV Decel Time: 175 msec     TR Vmax:        198.00 cm/s MV E velocity: 90.80 cm/s MV A velocity: 100.00 cm/s  SHUNTS MV E/A ratio:  0.91         Systemic VTI:  0.25 m                             Systemic Diam: 2.00 cm Dalton McleanMD Electronically signed by Franki Monte Signature Date/Time: 11/15/2022/2:39:16 PM    Final     PHYSICAL EXAM  Physical Exam  Constitutional: Appears well-developed and well-nourished.   Cardiovascular: Normal rate and regular rhythm.  Respiratory: Effort normal, non-labored breathing  Neuro: Mental Status: He is alert oriented person place and time.  No dysarthria.  No aphasia. Cranial Nerves: II: Visual Fields are full. Pupils are equal, round, and reactive to light.   III,IV, VI: EOMI without ptosis or diploplia.  V: Facial sensation is symmetric to temperature VII: Left nasolabial fold flattening VIII: Hearing is intact to voice X: Palate elevates symmetrically XI: Shoulder shrug is symmetric. XII: Tongue protrudes midline without atrophy or fasciculations.  Motor: Generalized weakness in all 4 extremities. Sensory: Sensation is symmetric to light touch and temperature in the arms and legs.  Cerebellar: Dysmetria with right lower extremity HKS Right hand grasp weak   ASSESSMENT/PLAN [name] is a 47 y.o. female with history of previous stroke in 2023 (L basal ganglia), anxiety/depression/suicidal ideation, diabetes, hyperlipidemia, hypertension, migraines, cocaine abuse who presents as a code stroke today after arriving to a doctor's appointment this morning and exhibiting increased right-sided  weakness and slurred speech.   Stroke:  Left pontine infarct, likely small vessel disease in the setting of uncontrolled risk factors and cocaine use  Code Stroke CT head No acute abnormality. ASPECTS 10.  MRI  Small acute infarct in the left pons MRA  No LVO or significant stenosis  2D Echo EF 60-65%, left atria severely dilated LDL 99 HgbA1c 5.6 VTE prophylaxis -Lovenox aspirin 81 mg daily prior to admission, now on aspirin 81 mg daily and clopidogrel 75 mg daily for 3 weeks and then plavix alone.-Can DC aspirin and Plavix if anticoagulation is started which is okay from neurostandpoint as stroke is small and now day 2 post stroke.  Discussed with primary team. Medication compliance education provided. Therapy recommendations: Pending Disposition: pending  History of stroke 05/2021 admitted for acute right caudate infarct, MRI also showed old left BG/CR infarct.  CTA head and neck unremarkable.  EF 60 to 65%, LDL 245, A1c 8.6.  Discharged on DAPT and Crestor.  12/2021 admitted for left CR/BG infarct status post TNK.  CT head and neck showed possible left M2 moderate to severe stenosis.  EF 70 to 75%.  LDL 117, A1c 7.1.  Discharged on DAPT and Crestor 20.  Hypertension Home meds: Norvasc, irbesartan, metoprolol succinate Stable Long-term BP goal normotensive  Hyperlipidemia Home meds: Crestor 20 mg LDL 99, goal < 70 Increase Crestor to 40 Continue statin at discharge  Diabetes type II Controlled Home meds: Insulin, glimepiride HgbA1c 5.6, goal < 7.0 CBGs SSI  AKI with CKD nephrology consulted Renal ultrasound shows evidence of chronic kidney disease Cr 4.90-4.28-4.36 On IV fluid  Tobacco abuse Current smoker Smoking cessation counseling provided Pt is willing to quit  Cocaine abuse UDS positive for cocaine every admission Cessation education again provided Patient is willing to create  Other Stroke Risk Factors ETOH use, alcohol level <10, advised to drink no more  than 1 drink(s) a day Migraine  Other Active Problems Hypokalemia-replaced per primary team Leukocytosis-resolved WBC 9.8  Hospital day # 0  ATTENDING ATTESTATION:  Discussed with primary team okay to stop aspirin Plavix and transition to anticoagulation due to nephrology team recommendation based on nephrotic syndrome and hypoalbuminemia.  Follow-up in stroke clinic as discussed previously.  Neurology sign off please call with questions.  MDM: Moderate Pertinent labs, imaging results reviewed by me and considered in my decision making. Independently reviewed imaging. Medical records reviewed. Discussed the patient with another medical provider/personnel.      Emslee Lopezmartinez,MD      To contact Stroke Continuity provider, please refer to [email or web address] After hours, contact General Neurology

## 2022-11-16 NOTE — Progress Notes (Signed)
Occupational Therapy Treatment Patient Details Name: Stacey Lucas MRN: [id number] DOB: December 14, 1975 Today's Date: 11/16/2022   History of present illness 47 yo female presents to [facility] on 3/7 with R weakness, slurred speech. MRI brain shows Small acute infarct in the left pons. PMH includes PMH significant for previous stroke in 2023 (L basal ganglia), anxiety/depression/suicidal ideation, diabetes, hyperlipidemia, hypertension, migraines, cocaine abuse.   OT comments  Pt. Seen for skilled OT treatment.  Pt. Able to describe previous issues with medication management.  Reviewed benefits of pill box that her b.friend could set up each week to aide in decreased errors. Pt. Verbalized understanding and states that would be helpful.  Pt. With some safety issues during in room ambulation with managing obstacles.  Requires cues for sequencing and safety during ambulation and sit/stand, stand/sit.  Will benefit from continued acute OT while here with focus on compensatory strategies and safety during adls and mobility tasks.  Agree with current d/c recommendations.     Recommendations for follow up therapy are one component of a multi-disciplinary discharge planning process, led by the attending physician.  Recommendations may be updated based on patient status, additional functional criteria and insurance authorization.    Follow Up Recommendations  Home health OT     Assistance Recommended at Discharge Frequent or constant Supervision/Assistance  Patient can return home with the following  A little help with walking and/or transfers;A little help with bathing/dressing/bathroom;Assistance with cooking/housework;Direct supervision/assist for medications management;Direct supervision/assist for financial management;Assist for transportation;Help with stairs or ramp for entrance   Equipment Recommendations       Recommendations for Other Services      Precautions / Restrictions Precautions Precautions:  Fall       Mobility Bed Mobility Overal bed mobility: Needs Assistance Bed Mobility: Supine to Sit     Supine to sit: Supervision     General bed mobility comments: seated eob at end of session to eat her b.fast    Transfers Overall transfer level: Needs assistance Equipment used: Rolling walker (2 wheels) Transfers: Sit to/from Stand, Bed to chair/wheelchair/BSC Sit to Stand: Min assist           General transfer comment: min assist to power up and steady-has a rollator in room that is missing a piece on the right handle so it is not staying at the correct height so we used the rw that was in the room     Balance                                           ADL either performed or assessed with clinical judgement   ADL Overall ADL's : Needs assistance/impaired     Grooming: Standing;Minimal assistance Grooming Details (indicate cue type and reason): stood at counter and organized and looked through items in basin.  wanted to do adls after her b.fast                             Functional mobility during ADLs: Minimal assistance;Rolling walker (2 wheels) General ADL Comments: created barriers and cluttered path to challenge pts. safety awareness and planning.  pt. able to identify barriers but did not utilize the safest methods.  ie: took the rw and pushed the item out of her way.  also completing sitting without fully reaching the surface and did not reach  back prior to sitting.  states b.friend manages her meds because "i would take the wrong ones at the wrong time of day".  reviewed and showed pill box as a method of organizing meds as she his home alone some per her report and would need to be able to safely take midday meds when he is not there.  showed her if they were all set up for the week she could use the specific day and prevent errors.  she verbalized understanding and states she will review with him when he comes today.  end of  session able to recall the things we discussed and that she would talk with him about getting a pill box.    Extremity/Trunk Assessment              Vision       Perception     Praxis      Cognition Arousal/Alertness: Awake/alert Behavior During Therapy: WFL for tasks assessed/performed Overall Cognitive Status: Impaired/Different from baseline Area of Impairment: Orientation, Problem solving, Awareness, Safety/judgement, Following commands                       Following Commands: Follows one step commands consistently, Follows one step commands with increased time, Follows multi-step commands inconsistently Safety/Judgement: Decreased awareness of safety, Decreased awareness of deficits Awareness: Emergent Problem Solving: Slow processing, Difficulty sequencing, Requires verbal cues General Comments: pt. was able to describe previous issues with medication management and that her boyfriend assist with them.  reviewed benefits of pill box and how he could set it up at the beginning of each week        Exercises      Shoulder Instructions       General Comments      Pertinent Vitals/ Pain       Pain Assessment Pain Assessment: No/denies pain  Home Living                                          Prior Functioning/Environment              Frequency  Min 2X/week        Progress Toward Goals  OT Goals(current goals can now be found in the care plan section)  Progress towards OT goals: Progressing toward goals  ADL Goals Additional ADL Goal #1: Pt will complete pill box test with less than 2 errors mod I Additional ADL Goal #2: pt will complete 5 step path finding task with written instructions mod I Additional ADL Goal #3: Pt will complete full adl at sink MOD i  Plan Discharge plan remains appropriate    Co-evaluation                 AM-PAC OT "6 Clicks" Daily Activity     Outcome Measure   Help from another  person eating meals?: A Little Help from another person taking care of personal grooming?: A Little Help from another person toileting, which includes using toliet, bedpan, or urinal?: A Little Help from another person bathing (including washing, rinsing, drying)?: A Little Help from another person to put on and taking off regular upper body clothing?: A Little Help from another person to put on and taking off regular lower body clothing?: A Little 6 Click Score: 18    End of Session Equipment Utilized During Treatment: Rolling walker (  2 wheels);Gait belt  OT Visit Diagnosis: Other abnormalities of gait and mobility (R26.89);Muscle weakness (generalized) (M62.81);Hemiplegia and hemiparesis;Other symptoms and signs involving cognitive function Hemiplegia - Right/Left: Right Hemiplegia - dominant/non-dominant: Dominant Hemiplegia - caused by: Cerebral infarction   Activity Tolerance Patient tolerated treatment well   Patient Left in bed;with call bell/phone within reach;with bed alarm set (seated eob eating b.fast)   Nurse Communication Other (comment) (rn in room briefly at beginning of session pt. requesting dentrue adhesive)        [id number] OT Time Calculation (min): 14 min  Charges: OT General Charges $OT Visit: 1 Visit OT Treatments $Self Care/Home Management : 8-22 mins  Sonia Baller, COTA/L Acute Rehabilitation [phone]   Clearnce Sorrel Lorraine-COTA/L 11/16/2022, 9:07 AM

## 2022-11-16 NOTE — Progress Notes (Addendum)
Subjective:   Summary: Stacey Lucas is a 47 y.o. year old female currently admitted on the IMTS HD#1 for acute CVA and AKI on CKD.  Overnight Events: NAEON   Stacey Lucas reports no new right side weakness. She endorses pounding chest pain that is currently at a 5/10 that worsens with deep breathing. This chest pain goes up the left side of her neck. She has been able to walk to the bathroom with assistance from nursing and a walker. She thinks her left side droop has decreased since yesterday, and that her speech is getting more normal. She reports difficulty using her right hand to grab drinks and her spoon for eating. She denies dizziness, vision changes, nausea, vomiting, bloody stools or hematuria at this time.  Prior to her CVA, she mentioned vomiting continuously the day before she presented to her PCP. She has been out of her Plavix for 2 weeks due to switching over her insurance to [facility]. Had a discussion with her father as well over the phone.  Objective:  Vital signs in last 24 hours: Vitals:   11/15/22 1536 11/15/22 1937 11/15/22 2318 11/16/22 0335  BP: (!) 153/83 130/69 128/72 (!) 159/79  Pulse: 76 79 78 78  Resp: '18 19 19 18  '$ Temp: 98.4 F (36.9 C) 98.3 F (36.8 C) 98.4 F (36.9 C) 98.6 F (37 C)  TempSrc: Oral Oral Oral Oral  SpO2: 100% 99% 97% 98%  Weight:      Height:       Supplemental O2: Room Air SpO2: 98 %   Physical Exam:  Constitutional: sitting in bed, in no acute distress Cardiovascular: RRR, no murmurs, rubs or gallops Pulmonary/Chest: normal work of breathing on room air, lungs clear to auscultation bilaterally Abdominal: soft, non-tender, non-distended Skin: warm and dry Extremities: upper/lower extremity pulses 2+, LE edema present slightly more increased in left leg. Angioedema of her face.  Neuro: Alert and oriented x3. No uvular or tongue deviation.bilateral sensation is intact on upper and lower extremities. Dysarthria is  improving. 4/5 R strength in grip, dorsiflexion, otherwise 5/5. Right side dysmetria. Left side face droop less pronounced.  Filed Weights   02-Dec-2022 1000  Weight: 72 kg     Intake/Output Summary (Last 24 hours) at 11/16/2022 0720 Last data filed at 11/15/2022 2340 Gross per 24 hour  Intake 357 ml  Output 250 ml  Net 107 ml   Net IO Since Admission: 1,085.35 mL [11/16/22 0720]  Pertinent Labs:    Latest Ref Rng & Units 11/16/2022    3:52 AM 11/15/2022   11:38 AM 11/15/2022    6:18 AM  CBC  WBC 4.0 - 10.5 K/uL 8.6   9.8   Hemoglobin 12.0 - 15.0 g/dL 7.5  8.0  7.7   Hematocrit 36.0 - 46.0 % 22.3  23.8  22.5   Platelets 150 - 400 K/uL 171   228        Latest Ref Rng & Units 11/16/2022    3:52 AM 11/15/2022    6:18 AM 2022/12/02   10:14 AM  CMP  Glucose 70 - 99 mg/dL 190  144  103   BUN 6 - 20 mg/dL 44  40  35   Creatinine 0.44 - 1.00 mg/dL 4.59  4.36  4.28   Sodium 135 - 145 mmol/L 139  139  138   Potassium 3.5 - 5.1 mmol/L 4.2  3.4  3.9   Chloride 98 - 111 mmol/L 111  110  109   CO2 22 - 32 mmol/L '19  22  17   '$ Calcium 8.9 - 10.3 mg/dL 7.6  7.6  8.1   Total Protein 6.5 - 8.1 g/dL   5.0   Total Bilirubin 0.3 - 1.2 mg/dL   0.4   Alkaline Phos 38 - 126 U/L   69   AST 15 - 41 U/L   24   ALT 0 - 44 U/L   12      Imaging: ECHOCARDIOGRAM COMPLETE  Result Date: 11/15/2022    ECHOCARDIOGRAM REPORT   Patient Name:   Stacey Lucas Date of Exam: 11/15/2022 Medical Rec #:  [id number]    Height:       65.0 in Accession #:    [id number]   Weight:       158.7 lb Date of Birth:  02/03/1976   BSA:          1.793 m Patient Age:    57 years     BP:           143/79 mmHg Patient Gender: F            HR:           81 bpm. Exam Location:  Inpatient Procedure: 2D Echo, Cardiac Doppler and Color Doppler Indications:    stroke  History:        Patient has prior history of Echocardiogram examinations. Risk                 Factors:Hypertension, Diabetes and Dyslipidemia.  Sonographer:    Phineas Douglas Referring  Phys: [id number] [place]  1. Left ventricular ejection fraction, by estimation, is 60 to 65%. The left ventricle has normal function. The left ventricle has no regional wall motion abnormalities. Left ventricular diastolic parameters are consistent with Grade I diastolic dysfunction (impaired relaxation).  2. Right ventricular systolic function is normal. The right ventricular size is normal. There is normal pulmonary artery systolic pressure. The estimated right ventricular systolic pressure is [id number] mmHg.  3. Left atrial size was severely dilated.  4. The mitral valve is normal in structure. No evidence of mitral valve regurgitation. No evidence of mitral stenosis.  5. The aortic valve is tricuspid. Aortic valve regurgitation is not visualized. No aortic stenosis is present.  6. The inferior vena cava is normal in size with greater than 50% respiratory variability, suggesting right atrial pressure of 3 mmHg. FINDINGS  Left Ventricle: Left ventricular ejection fraction, by estimation, is 60 to 65%. The left ventricle has normal function. The left ventricle has no regional wall motion abnormalities. The left ventricular internal cavity size was normal in size. There is  no left ventricular hypertrophy. Left ventricular diastolic parameters are consistent with Grade I diastolic dysfunction (impaired relaxation). Right Ventricle: The right ventricular size is normal. No increase in right ventricular wall thickness. Right ventricular systolic function is normal. There is normal pulmonary artery systolic pressure. The tricuspid regurgitant velocity is 1.98 m/s, and  with an assumed right atrial pressure of 3 mmHg, the estimated right ventricular systolic pressure is [id number] mmHg. Left Atrium: Left atrial size was severely dilated. Right Atrium: Right atrial size was normal in size. Pericardium: Trivial pericardial effusion is present. Mitral Valve: The mitral valve is normal in structure. There is mild  calcification of the mitral valve leaflet(s). Mild mitral annular calcification. No evidence of mitral valve regurgitation. No evidence of  mitral valve stenosis. Tricuspid Valve: The tricuspid valve is normal in structure. Tricuspid valve regurgitation is trivial. Aortic Valve: The aortic valve is tricuspid. Aortic valve regurgitation is not visualized. No aortic stenosis is present. Pulmonic Valve: The pulmonic valve was normal in structure. Pulmonic valve regurgitation is not visualized. Aorta: The aortic root is normal in size and structure. Venous: The inferior vena cava is normal in size with greater than 50% respiratory variability, suggesting right atrial pressure of 3 mmHg. IAS/Shunts: No atrial level shunt detected by color flow Doppler.  LEFT VENTRICLE PLAX 2D LVIDd:         4.40 cm      Diastology LVIDs:         2.50 cm      LV e' medial:    7.62 cm/s LV PW:         1.30 cm      LV E/e' medial:  11.9 LV IVS:        1.00 cm      LV e' lateral:   9.79 cm/s LVOT diam:     2.00 cm      LV E/e' lateral: 9.3 LV SV:         78 LV SV Index:   43 LVOT Area:     3.14 cm  LV Volumes (MOD) LV vol d, MOD A2C: 113.0 ml LV vol d, MOD A4C: 106.0 ml LV vol s, MOD A2C: 38.0 ml LV vol s, MOD A4C: 43.5 ml LV SV MOD A2C:     75.0 ml LV SV MOD A4C:     106.0 ml LV SV MOD BP:      72.4 ml RIGHT VENTRICLE             IVC RV Basal diam:  3.60 cm     IVC diam: 1.90 cm RV S prime:     12.10 cm/s TAPSE (M-mode): 2.4 cm LEFT ATRIUM              Index        RIGHT ATRIUM           Index LA diam:        3.60 cm  2.01 cm/m   RA Area:     14.90 cm LA Vol (A2C):   102.0 ml 56.89 ml/m  RA Volume:   37.90 ml  21.14 ml/m LA Vol (A4C):   90.0 ml  50.19 ml/m LA Biplane Vol: 98.8 ml  55.10 ml/m  AORTIC VALVE LVOT Vmax:   104.00 cm/s LVOT Vmean:  67.700 cm/s LVOT VTI:    0.247 m  AORTA Ao Root diam: 2.90 cm Ao Asc diam:  3.00 cm MITRAL VALVE                TRICUSPID VALVE MV Area (PHT): 4.33 cm     TR Peak grad:   15.7 mmHg MV Decel  Time: 175 msec     TR Vmax:        198.00 cm/s MV E velocity: 90.80 cm/s MV A velocity: 100.00 cm/s  SHUNTS MV E/A ratio:  0.91         Systemic VTI:  0.25 m                             Systemic Diam: 2.00 cm Dalton McleanMD Electronically signed by Franki Monte Signature Date/Time: 11/15/2022/2:39:16 PM    Final    VAS US CAROTID  Result Date: 11/15/2022 Carotid Arterial Duplex Study Patient Name:  Stacey Lucas  Date of Exam:   11/15/2022 Medical Rec #: [id number]     Accession #:    [id number] Date of Birth: 07-12-1976    Patient Gender: F Patient Age:   71 years Exam Location:  [facility] Procedure:      VAS US CAROTID Referring Phys: Rosalin Hawking --------------------------------------------------------------------------------  Performing Technologist: Darlin Coco RDMS, RVT  Examination Guidelines: A complete evaluation includes B-mode imaging, spectral Doppler, color Doppler, and power Doppler as needed of all accessible portions of each vessel. Bilateral testing is considered an integral part of a complete examination. Limited examinations for reoccurring indications may be performed as noted.  Right Carotid Findings: +----------+--------+--------+--------+------------------+--------+           PSV cm/sEDV cm/sStenosisPlaque DescriptionComments +----------+--------+--------+--------+------------------+--------+ CCA Prox  68      14                                         +----------+--------+--------+--------+------------------+--------+ CCA Distal54      14              focal mild                 +----------+--------+--------+--------+------------------+--------+ ICA Prox  54      21                                         +----------+--------+--------+--------+------------------+--------+ ICA Mid   52      15                                         +----------+--------+--------+--------+------------------+--------+ ICA Distal29      9                                           +----------+--------+--------+--------+------------------+--------+ ECA       92      17                                         +----------+--------+--------+--------+------------------+--------+ +----------+--------+-------+--------+-------------------+           PSV cm/sEDV cmsDescribeArm Pressure (mmHG) +----------+--------+-------+--------+-------------------+ [id number]                                         +----------+--------+-------+--------+-------------------+ +---------+--------+--+--------+--+ VertebralPSV cm/s95EDV cm/s26 +---------+--------+--+--------+--+  Left Carotid Findings: +----------+--------+--------+--------+------------------+--------+           PSV cm/sEDV cm/sStenosisPlaque DescriptionComments +----------+--------+--------+--------+------------------+--------+ CCA Prox  91      16                                         +----------+--------+--------+--------+------------------+--------+ CCA Distal77      17                                         +----------+--------+--------+--------+------------------+--------+  ICA Prox  50      17                                         +----------+--------+--------+--------+------------------+--------+ ICA Mid   135     41                                tortuous +----------+--------+--------+--------+------------------+--------+ ICA Distal100     31                                         +----------+--------+--------+--------+------------------+--------+ ECA       76      14                                         +----------+--------+--------+--------+------------------+--------+ +----------+--------+--------+--------+-------------------+           PSV cm/sEDV cm/sDescribeArm Pressure (mmHG) +----------+--------+--------+--------+-------------------+ [id number]                                          +----------+--------+--------+--------+-------------------+ +---------+--------+--+--------+--+ VertebralPSV cm/s70EDV cm/s21 +---------+--------+--+--------+--+   Summary: Right Carotid: The extracranial vessels were near-normal with only minimal wall                thickening or plaque. Left Carotid: The extracranial vessels were near-normal with only minimal wall               thickening or plaque. Vertebrals:  Bilateral vertebral arteries demonstrate antegrade flow. Subclavians: Normal flow hemodynamics were seen in bilateral subclavian              arteries. *See table(s) above for measurements and observations.     Preliminary       Assessment/Plan:    Patient Summary: Stacey Lucas is a 47 y.o. with a pertinent PMH of prior CVAs, HTN, T2DM, and polysubstance use, who presented with slurred speech and new right side weakness and admitted for acute CVA.   #Acute CVA #polysubstance use Symptoms improving. On DAPT currently, but will likely initiate AC due to nephrotic syndrome. OK to start heparin per neuro as she had a small infarct and we are now >48 hrs out.Of note, there is ongoing concern for occult afib with L atrial enlargement on ECHO and possible prior afib event on cardiac monitor. No afib during this admission. Will keep on tele and likely dc with 30 day monitor. - Stop DAPT after initiating heparin drip. Likely transition to eliquis pending nephrology kidney biopsy - crestor 24 - appreciate PT recs and acute support to return baseline function - appreciate speech language pathology recs - counseling polysubstance use cessation - telemetry, 30 day cardiac monitor  #AKI on CKD #Nephrotic Syndrome #C/f MM Her GFR is 11 on presentation, down from 32 9  months ago. Per discussion today, she apparently has multiple family members who have had nephrotic syndrome or GN. Confirmed nephrotic syndrome with >6g protein over 24 hrs. Hepatitis labs and HIV unremarkable. She is also  being worked up for multiple myeloma due to her CRAB criteria. Initiating  AC for nephrotic syndrome as above, pending kidney biopsy with nephrology - f/u multiple myeloma studies: SPEP, UPEP, light chain - f/u complement for suspected nephrotic syndrome - appreciate nephro recs, assistance with kidney biopsy  #chest pain Endorsing pleuritic chest pain with some exertional component. Nontachycardic and oxygenating well on RA. Only been in hospital for 2 days with VTE onboard but she is hypercoagulable at baseline. Wells moderate. Asymmetric L>R LE edema.  -f\u EKG, trops -f\u previously ordered DVT study, VQ scan  #Anemia of Chronic Disease Likely anemia of CKD with some dilutional component since admisison. No active bleeding. Hypoproliferative without IDA. Nephrology initiated aranesp today, shouldn't significantly increase coagulability risk per our conversation unless she becomes polycythemic.  -aranesp per nephro  #T2DM - SSI  #HTN - amlodipine 10 mg  #HLD - Crestor increased to 40 mg  #Asymptomatic Pyuria CTM  #Depression Continue celexa, wellbutrin  Diet: Normal IVF: None VTE: heparin Code: Full PT/OT recs: Flasher Student   I have seen and examined the patient, and reviewed the daily progress note by Margy Clarks, MS 3 and discussed the care of the patient with them.    Signed:  Linus Galas, MD 11/16/2022, 5:39 PM

## 2022-11-16 NOTE — Progress Notes (Addendum)
Physical Therapy Treatment Patient Details Name: Stacey Lucas MRN: [id number] DOB: [birth year] Today's Date: 11/16/2022   History of Present Illness 47 yo female presents to [facility] on 3/7 with R weakness, slurred speech. MRI brain shows Small acute infarct in the left pons. PMH includes PMH significant for previous stroke in 2023 (L basal ganglia), anxiety/depression/suicidal ideation, diabetes, hyperlipidemia, hypertension, migraines, cocaine abuse.    PT Comments    Pt making steady progress towards her physical therapy goals, exhibiting improved activity tolerance and ambulation distance. Pt ambulating 100 ft x 2 with a Rollator at a min guard assist level. Demonstrates RLE weakness and gait abnormalities in comparison to baseline in addition to decreased endurance. Will benefit from HHPT to address.    Recommendations for follow up therapy are one component of a multi-disciplinary discharge planning process, led by the attending physician.  Recommendations may be updated based on patient status, additional functional criteria and insurance authorization.  Follow Up Recommendations  Home health PT     Assistance Recommended at Discharge PRN  Patient can return home with the following A little help with walking and/or transfers;A little help with bathing/dressing/bathroom   Equipment Recommendations  Other (comment) (Rollator needs a new part vs needs a new Rollator)    Recommendations for Other Services       Precautions / Restrictions Precautions Precautions: Fall Restrictions Weight Bearing Restrictions: No     Mobility  Bed Mobility Overal bed mobility: Modified Independent                  Transfers Overall transfer level: Needs assistance Equipment used: Rollator (4 wheels) Transfers: Sit to/from Stand Sit to Stand: Min guard                Ambulation/Gait Ambulation/Gait assistance: Min guard Gait Distance (Feet): 100 Feet (100", 100") Assistive  device: Rolling walker (2 wheels) Gait Pattern/deviations: Decreased dorsiflexion - right, Trunk flexed, Knee hyperextension - right, Step-through pattern, Decreased step length - right, Decreased dorsiflexion - left Gait velocity: decreased     General Gait Details: Pt with increased bilateral foot external rotation at baseline, new decreased R step length, decreased bilateral heel strike at initial contact. Pt ambulating 100 ft x 2 with a seated rest break in between bouts. Min guard for Barrister's clerk    Modified Rankin (Stroke Patients Only) Modified Rankin (Stroke Patients Only) Pre-Morbid Rankin Score: Slight disability Modified Rankin: Moderately severe disability     Balance Overall balance assessment: Needs assistance Sitting-balance support: No upper extremity supported, Feet supported Sitting balance-Leahy Scale: Good     Standing balance support: Bilateral upper extremity supported Standing balance-Leahy Scale: Poor                              Cognition Arousal/Alertness: Awake/alert Behavior During Therapy: WFL for tasks assessed/performed Overall Cognitive Status: Impaired/Different from baseline Area of Impairment: Problem solving, Awareness, Safety/judgement                         Safety/Judgement: Decreased awareness of safety, Decreased awareness of deficits Awareness: Emergent Problem Solving: Slow processing, Difficulty sequencing, Requires verbal cues          Exercises General Exercises - Lower Extremity Long Arc Quad: Both, 5 reps, Seated Hip Flexion/Marching: Both, 5 reps, Seated    General Comments  Pertinent Vitals/Pain Pain Assessment Pain Assessment: No/denies pain    Home Living                          Prior Function            PT Goals (current goals can now be found in the care plan section) Acute Rehab PT Goals Potential to Achieve Goals:  Good Progress towards PT goals: Progressing toward goals    Frequency    Min 4X/week      PT Plan Equipment recommendations need to be updated    Co-evaluation              AM-PAC PT "6 Clicks" Mobility   Outcome Measure  Help needed turning from your back to your side while in a flat bed without using bedrails?: None Help needed moving from lying on your back to sitting on the side of a flat bed without using bedrails?: None Help needed moving to and from a bed to a chair (including a wheelchair)?: A Little Help needed standing up from a chair using your arms (e.g., wheelchair or bedside chair)?: A Little Help needed to walk in hospital room?: A Little Help needed climbing 3-5 steps with a railing? : A Little 6 Click Score: 20    End of Session Equipment Utilized During Treatment: Gait belt Activity Tolerance: Patient tolerated treatment well Patient left: in bed;with call bell/phone within reach;with bed alarm set Nurse Communication: Mobility status PT Visit Diagnosis: Other abnormalities of gait and mobility (R26.89);Muscle weakness (generalized) (M62.81)     Time: [id number] PT Time Calculation (min) (ACUTE ONLY): 16 min  Charges:  $Therapeutic Activity: 8-22 mins                     Wyona Almas, PT, [name] Acute Rehabilitation Services Office [phone]    Deno Etienne 11/16/2022, 11:24 AM

## 2022-11-17 DIAGNOSIS — N049 Nephrotic syndrome with unspecified morphologic changes: Secondary | ICD-10-CM | POA: Diagnosis not present

## 2022-11-17 DIAGNOSIS — N179 Acute kidney failure, unspecified: Secondary | ICD-10-CM | POA: Diagnosis not present

## 2022-11-17 DIAGNOSIS — I639 Cerebral infarction, unspecified: Secondary | ICD-10-CM | POA: Diagnosis not present

## 2022-11-17 LAB — GLUCOSE, CAPILLARY
Glucose-Capillary: 135 mg/dL — ABNORMAL HIGH (ref 70–99)
Glucose-Capillary: 151 mg/dL — ABNORMAL HIGH (ref 70–99)
Glucose-Capillary: 143 mg/dL — ABNORMAL HIGH (ref 70–99)
Glucose-Capillary: 142 mg/dL — ABNORMAL HIGH (ref 70–99)

## 2022-11-17 LAB — CBC
HCT: 20.8 % — ABNORMAL LOW (ref 36.0–46.0)
Hemoglobin: 6.9 g/dL — CL (ref 12.0–15.0)
MCH: 25 pg — ABNORMAL LOW (ref 26.0–34.0)
MCHC: 33.2 g/dL (ref 30.0–36.0)
MCV: 75.4 fL — ABNORMAL LOW (ref 80.0–100.0)
Platelets: 204 10*3/uL (ref 150–400)
RBC: 2.76 MIL/uL — ABNORMAL LOW (ref 3.87–5.11)
RDW: 16.6 % — ABNORMAL HIGH (ref 11.5–15.5)
WBC: 10.6 10*3/uL — ABNORMAL HIGH (ref 4.0–10.5)
nRBC: 0 % (ref 0.0–0.2)

## 2022-11-17 LAB — RENAL FUNCTION PANEL
Albumin: <1.5 g/dL — ABNORMAL LOW (ref 3.5–5.0)
Anion gap: 7 (ref 5–15)
BUN: 47 mg/dL — ABNORMAL HIGH (ref 6–20)
CO2: 22 mmol/L (ref 22–32)
Calcium: 7.6 mg/dL — ABNORMAL LOW (ref 8.9–10.3)
Chloride: 111 mmol/L (ref 98–111)
Creatinine, Ser: 4.34 mg/dL — ABNORMAL HIGH (ref 0.44–1.00)
GFR, Estimated: 12 mL/min — ABNORMAL LOW (ref >60)
Glucose, Bld: 123 mg/dL — ABNORMAL HIGH (ref 70–99)
Phosphorus: 5.1 mg/dL — ABNORMAL HIGH (ref 2.5–4.6)
Potassium: 3.9 mmol/L (ref 3.5–5.1)
Sodium: 140 mmol/L (ref 135–145)

## 2022-11-17 LAB — URINE CULTURE: Culture: >=100000 — AB

## 2022-11-17 LAB — HEPARIN LEVEL (UNFRACTIONATED)
Heparin Unfractionated: 0.19 IU/mL — ABNORMAL LOW (ref 0.30–0.70)
Heparin Unfractionated: 0.4 IU/mL (ref 0.30–0.70)

## 2022-11-17 LAB — PREPARE RBC (CROSSMATCH)

## 2022-11-17 LAB — ABO/RH: ABO/RH(D): O POS

## 2022-11-17 LAB — HEMOGLOBIN AND HEMATOCRIT, BLOOD
HCT: 24.7 % — ABNORMAL LOW (ref 36.0–46.0)
Hemoglobin: 8.5 g/dL — ABNORMAL LOW (ref 12.0–15.0)

## 2022-11-17 MED ORDER — LOPERAMIDE HCL 2 MG PO CAPS
2.0000 mg | ORAL_CAPSULE | Freq: Once | ORAL | Status: AC
  Administered 2022-11-17: 2 mg via ORAL
  Filled 2022-11-17: qty 1

## 2022-11-17 MED ORDER — STROKE: EARLY STAGES OF RECOVERY BOOK
Status: AC
  Filled 2022-11-17: qty 1

## 2022-11-17 MED ORDER — FUROSEMIDE 40 MG PO TABS
40.0000 mg | ORAL_TABLET | Freq: Every day | ORAL | Status: DC
  Administered 2022-11-17 – 2022-11-21 (×5): 40 mg via ORAL
  Filled 2022-11-17 (×6): qty 1

## 2022-11-17 MED ORDER — SODIUM CHLORIDE 0.9% IV SOLUTION: Freq: Once | INTRAVENOUS | Status: AC

## 2022-11-17 NOTE — Progress Notes (Signed)
Dr. Lawanna Kobus was text page that pt. HGB was 6.9 this morning. Day nurse Cian was made aware and for follow up with Dr. Lawanna Kobus.

## 2022-11-17 NOTE — Progress Notes (Signed)
Dr. Sanjuana Mae was made aware that pt c/o CP. VSS, and she denied SOB. Pt also had 3 semi soft BM. No intervention for CP. Dr. Michela Pitcher he will order imodium.

## 2022-11-17 NOTE — Progress Notes (Signed)
Called and spoke to [place] (phlebotomist) that AM labs has been drawn and in process. MD notified.

## 2022-11-17 NOTE — Progress Notes (Signed)
ANTICOAGULATION CONSULT NOTE - Follow Up Consult  Pharmacy Consult for Heparin Indication:  nephrotic syndrome, acute CVA  Allergies  Allergen Reactions   Gabapentin     Weakness/impairs balance    Patient Measurements: Height: '5\' 5"'$  (165.1 cm) Weight: 72 kg (158 lb 11.7 oz) IBW/kg (Calculated) : 57 Heparin Dosing Weight: 71.5 kg   Vital Signs: Temp: 98.6 F (37 C) (03/10 1343) Temp Source: Oral (03/10 1343) BP: 159/88 (03/10 1343) Pulse Rate: 82 (03/10 1343)  Labs: Recent Labs    11/15/22 0618 11/15/22 1138 11/16/22 0352 11/16/22 1102 11/16/22 1255 11/16/22 2123 11/17/22 0617 11/17/22 1548 11/17/22 1743  HGB 7.7*   < > 7.5*  --   --   --  6.9* 8.5*  --   HCT 22.5*   < > 22.3*  --   --   --  20.8* 24.7*  --   PLT 228  --  171  --   --   --  204  --   --   HEPARINUNFRC  --   --   --   --   --  0.16* 0.19*  --  0.40  CREATININE 4.36*  --  4.59*  --   --   --  4.34*  --   --   TROPONINIHS  --   --   --  14 13  --   --   --   --    < > = values in this interval not displayed.     Estimated Creatinine Clearance: 15.9 mL/min (A) (by C-G formula based on SCr of 4.34 mg/dL (H)).   Medications:  Scheduled:   amLODipine  10 mg Oral Daily   buPROPion  150 mg Oral Daily   citalopram  10 mg Oral Daily   darbepoetin (ARANESP) injection - NON-DIALYSIS  60 mcg Subcutaneous Q Sat-1800   furosemide  40 mg Oral Daily   insulin aspart  0-9 Units Subcutaneous TID WC   ketotifen  2 drop Both Eyes BID   loperamide  2 mg Oral Once   rosuvastatin  40 mg Oral Daily   Infusions:   heparin 1,200 Units/hr (11/17/22 1045)    Assessment: 47 yo female with PMH of prior CVAs, HTN, T2DM, now presenting with acute CVA and AKI. Patient was not on anticoagulation PTA. Patient was started on DAPT with aspirin and clopidogrel - neurology ok with discontinuing DAPT if starting anticoagulation. Baseline Hgb 7.5, plt wnl. Pharmacy consulted for heparin dosing.   Heparin level today is  therapeutic at 0.40, on 1200 units/hr. Hgb 6.9 - received PRBC, now up to 8.5. No s/sx of bleeding or infusion issues per nursing.   Goal of Therapy:  Heparin level 0.3-0.5 units/ml Monitor platelets by anticoagulation protocol: Yes   Plan:  Continue IV heparin at 1200 units/hr. Check ~8 hr heparin level with AM labs  Daily CBC, heparin level. Monitor for signs/symptoms of bleeding.  Antonietta Jewel, PharmD, [place] Clinical Pharmacist  Phone: [phone] 11/17/2022 6:46 PM  Please check AMION for all [place] phone numbers After 10:00 PM, call [place] [phone]

## 2022-11-17 NOTE — Progress Notes (Addendum)
Subjective:   Summary: Rionna Gleed is a 47 y.o. year old female currently admitted on the IMTS HD#1 for acute CVA and AKI on CKD.  Overnight Events: NAEON, hypertensive, remains on room air.  Patient with chest pain and diarrhea overnight.  Imodium started.  ACS and PE already ruled out yesterday.  She denies any blood in stools, dark colored stools, vomiting/hematemesis, hematuria. No other noted bleeding episodes. She feels fine right now, eating some breakfast.  No new focal neurological deficits.  She has, her kidney function could get so bad and we discussed how she has likely been having symptoms with nephrotic syndrome for a few weeks at least including swelling in her face and legs and fatigue.  Talked about risk factors for stroke and appropriate management.  Answered all questions.  Objective:  Vital signs in last 24 hours: Vitals:   11/17/22 0311 11/17/22 0802 11/17/22 1056 11/17/22 1131  BP: (!) 161/86 (!) 193/106 (!) 158/78 (!) 155/78  Pulse: 84 78 83 83  Resp: 16 18    Temp: 98.9 F (37.2 C) 98.2 F (36.8 C) 98.7 F (37.1 C) 98.5 F (36.9 C)  TempSrc: Oral  Oral Oral  SpO2: 100% 100% 100% 100%  Weight:      Height:       Supplemental O2: Room Air SpO2: 100 % O2 Flow Rate (L/min): 200 L/min   Physical Exam:  Constitutional: sitting in bed, in no acute distress Cardiovascular: RRR, no murmurs, rubs or gallops Pulmonary/Chest: normal work of breathing on room air, lungs clear to auscultation bilaterally Abdominal: soft, non-tender, non-distended Skin: warm and dry Extremities: upper/lower extremity pulses 2+, minimal lower extremity edema,L>R.  No TTP. Neuro: Alert and oriented x3. No uvular or tongue deviation.bilateral sensation is intact on upper and lower extremities. Dysarthria is improving. 4/5  strength in right grip, dorsiflexion, otherwise 5/5. Right side dysmetria. Left side facial droop less pronounced.  Filed Weights   2022/12/10  1000  Weight: 72 kg     Intake/Output Summary (Last 24 hours) at 11/17/2022 1213 Last data filed at 11/17/2022 1131 Gross per 24 hour  Intake 885 ml  Output 1100 ml  Net -215 ml   Net IO Since Admission: 1,110.35 mL [11/17/22 1213]  Pertinent Labs:    Latest Ref Rng & Units 11/17/2022    6:17 AM 11/16/2022    3:52 AM 11/15/2022   11:38 AM  CBC  WBC 4.0 - 10.5 K/uL 10.6  8.6    Hemoglobin 12.0 - 15.0 g/dL 6.9  7.5  8.0   Hematocrit 36.0 - 46.0 % 20.8  22.3  23.8   Platelets 150 - 400 K/uL 204  171         Latest Ref Rng & Units 11/17/2022    6:17 AM 11/16/2022    3:52 AM 11/15/2022    6:18 AM  CMP  Glucose 70 - 99 mg/dL 123  190  144   BUN 6 - 20 mg/dL 47  44  40   Creatinine 0.44 - 1.00 mg/dL 4.34  4.59  4.36   Sodium 135 - 145 mmol/L 140  139  139   Potassium 3.5 - 5.1 mmol/L 3.9  4.2  3.4   Chloride 98 - 111 mmol/L 111  111  110   CO2 22 - 32 mmol/L '22  19  22   '$ Calcium 8.9 - 10.3 mg/dL 7.6  7.6  7.6      Imaging: NM Pulmonary Perfusion  Result Date: 11/16/2022 CLINICAL DATA:  High probability pulmonary embolism. EXAM: NUCLEAR MEDICINE PERFUSION LUNG SCAN TECHNIQUE: Perfusion images were obtained in multiple projections after intravenous injection of radiopharmaceutical. Ventilation scans intentionally deferred if perfusion scan and chest x-ray adequate for interpretation during COVID 19 epidemic. RADIOPHARMACEUTICALS:  3.8 mCi Tc-69mMAA IV COMPARISON:  Chest x-ray 11/16/2022 FINDINGS: There is normal perfusion in all projections. No wedge shaped pleural-based defects are identified. IMPRESSION: Normal perfusion. Electronically Signed   By: ENolon NationsM.D.   On: 11/16/2022 14:10   DG CHEST PORT 1 VIEW  Result Date: 11/16/2022 CLINICAL DATA:  Shortness of breath. EXAM: PORTABLE CHEST 1 VIEW COMPARISON:  Radiograph 2 days ago 11/14/2022 FINDINGS: The cardiomediastinal contours are normal. Minimal right infrahilar atelectasis is new from prior exam. Pulmonary vasculature is  normal. No consolidation, pleural effusion, or pneumothorax. No acute osseous abnormalities are seen. IMPRESSION: Minimal right infrahilar atelectasis, new from prior exam. No other acute findings. Electronically Signed   By: MKeith RakeM.D.   On: 11/16/2022 12:37      Assessment/Plan:    Patient Summary: TAnia Krauseis a 47y.o. with a pertinent PMH of prior CVAs, HTN, T2DM, and polysubstance use, who presented with slurred speech and new right side weakness and admitted for acute CVA.   #Acute CVA #polysubstance use Symptoms improving.  Switched to heparin yesterday due to nephrotic syndrome and low albumin and DC'd DAPT. Of note, there is ongoing concern for occult afib with L atrial enlargement on ECHO and possible prior afib event on cardiac monitor. No afib during this admission. Will keep on tele and likely dc with 30 day monitor. -Continue heparin drip. Likely transition to eliquis pending nephrology kidney biopsy - crestor 479- appreciate PT recs and acute support to return baseline function - appreciate speech language pathology recs - counseling polysubstance use cessation - telemetry, 30 day cardiac monitor  #AKI on CKD #Nephrotic Syndrome #C/f MM Her GFR is 11 on presentation, down from 32 9  months ago. Per discussion today, she apparently has multiple family members who have had nephrotic syndrome or GN. Confirmed nephrotic syndrome with >6g protein over 24 hrs. Hepatitis labs and HIV unremarkable. She is also being worked up for multiple myeloma due to her CRAB criteria. Initiating AC for nephrotic syndrome as above, pending kidney biopsy with nephrology - f/u multiple myeloma studies: SPEP, UPEP, light chain - f/u complement for suspected nephrotic syndrome - appreciate nephro recs, assistance with kidney biopsy  #chest pain #Asymmetrical leg swelling Endorse pleuritic chest pain yesterday which is intermittent.  ACS, PE ruled out yesterday.  DVT study was  negative. -CTM.  If she has persistent chest pain, can repeat workup if high clinical suspicion  #Anemia of Chronic Disease Likely anemia of CKD with some dilutional component since admisison. Hypoproliferative without IDA. Nephrology initiated aranesp today, shouldn't significantly increase coagulability risk per our conversation unless she becomes polycythemic.  She had a drop in hemoglobin below 7 this morning.  Administered 1 unit PRBCs. Think we can continue heparin with no clear bleeding. -aranesp per nephro - Follow-up posttransfusion H&H  #T2DM - SSI  #HTN - amlodipine 10 mg  #HLD - Crestor increased to 40 mg  #Asymptomatic Pyuria CTM  #Depression Continue celexa, wellbutrin  Diet: Normal IVF: None VTE: heparin Code: Full PT/OT recs: Home Health  Signed:  SLinus Galas MD 11/17/2022, 12:13 PM

## 2022-11-17 NOTE — Progress Notes (Signed)
[facility] Progress Note   Assessment/ Plan:   47 year old female with history of CVA, diabetes, and hypertension admitted for acute left pontine infarct, found to have severe AKI with proteinuria, hypoalbuminemia, and edema suggestive of nephrotic syndrome.   Nephrotic syndrome with AKI on CKD Suspect nephrotic syndrome in this person with severe hypoalbuminemia and edema HIV screen negative.   HCV negative, Hep B immune Complement levels pending.   Myeloma workup also pending. - 24 hr urine with 6 g protein - had historically high A1cs - albumin < 1.5, with the infarct too, likely DOAC appropriate - on hep gtt in case we decide a biopsy is needed- I told pt that I'd like to see some of these serologies back before we decided finally so it's not ordered yet  - start Lasix 40 mg daily   Acute left pontine infarct Young, but risk factors for ASCVD include poorly controlled diabetes, hypertension, and hyperlipidemia.  She also has cocaine use disorder.  However given the high index of suspicion for nephrotic syndrome and her severe hypoalbuminemic state, I wonder about hypercoagulability.  Additionally she has LA dilatation on her echo which hints at A-fib that could further increase her risk for embolic stroke. Before starting anticoagulation I want to see results of urine protein and serology studies which will guide decision to perform renal biopsy.   Anemia Iron replete. Low reticulocyte index. Suspect due to renal disease. - ordered ESA 11/16/22   Diabetes Insulin per primary team.   Hypertension BP management per primary team.  Subjective:    Seen in room.  Cr improved, on hep gtt now.  Hgb 6.9, getting transufsion   Objective:   BP (!) 193/106 (BP Location: Right Arm)   Pulse 78   Temp 98.2 F (36.8 C)   Resp 18   Ht '5\' 5"'$  (1.651 m)   Wt 72 kg   LMP 10/11/2019   SpO2 100%   BMI 26.41 kg/m   Intake/Output Summary (Last 24 hours) at 11/17/2022  [id number] Last data filed at 11/16/2022 1800 Gross per 24 hour  Intake 320 ml  Output 1100 ml  Net -780 ml   Weight change:   Physical Exam: Gen:NAD, sittnig in bed CVS:RRR Resp: clear Abd: soft Ext: trace LE edema Neuro: + dysarthria  Imaging: NM Pulmonary Perfusion  Result Date: 11/16/2022 CLINICAL DATA:  High probability pulmonary embolism. EXAM: NUCLEAR MEDICINE PERFUSION LUNG SCAN TECHNIQUE: Perfusion images were obtained in multiple projections after intravenous injection of radiopharmaceutical. Ventilation scans intentionally deferred if perfusion scan and chest x-ray adequate for interpretation during COVID 19 epidemic. RADIOPHARMACEUTICALS:  3.8 mCi Tc-73mMAA IV COMPARISON:  Chest x-ray 11/16/2022 FINDINGS: There is normal perfusion in all projections. No wedge shaped pleural-based defects are identified. IMPRESSION: Normal perfusion. Electronically Signed   By: ENolon NationsM.D.   On: 11/16/2022 14:10   DG CHEST PORT 1 VIEW  Result Date: 11/16/2022 CLINICAL DATA:  Shortness of breath. EXAM: PORTABLE CHEST 1 VIEW COMPARISON:  Radiograph 2 days ago 11/14/2022 FINDINGS: The cardiomediastinal contours are normal. Minimal right infrahilar atelectasis is new from prior exam. Pulmonary vasculature is normal. No consolidation, pleural effusion, or pneumothorax. No acute osseous abnormalities are seen. IMPRESSION: Minimal right infrahilar atelectasis, new from prior exam. No other acute findings. Electronically Signed   By: MKeith RakeM.D.   On: 11/16/2022 12:37   VAS [place]OWER EXTREMITY VENOUS (DVT)  Result Date: 11/16/2022  Lower Venous DVT Study Patient Name:  TTYSHAI BOLAND  Date of Exam:   11/16/2022 Medical Rec #: [id number]     Accession #:    [id number] Date of Birth: Aug 18, 1976    Patient Gender: F Patient Age:   21 years Exam Location:  [facility] Procedure:      VAS [place] LOWER EXTREMITY VENOUS (DVT) Referring Phys: JULIE MACHEN  --------------------------------------------------------------------------------  Indications: Swelling.  Risk Factors: None identified. Comparison Study: No prior studies. Performing Technologist: Oliver Hum RVT  Examination Guidelines: A complete evaluation includes B-mode imaging, spectral Doppler, color Doppler, and power Doppler as needed of all accessible portions of each vessel. Bilateral testing is considered an integral part of a complete examination. Limited examinations for reoccurring indications may be performed as noted. The reflux portion of the exam is performed with the patient in reverse Trendelenburg.  +-----+---------------+---------+-----------+----------+--------------+ RIGHTCompressibilityPhasicitySpontaneityPropertiesThrombus Aging +-----+---------------+---------+-----------+----------+--------------+ CFV  Full           Yes      Yes                                 +-----+---------------+---------+-----------+----------+--------------+   +---------+---------------+---------+-----------+----------+--------------+ LEFT     CompressibilityPhasicitySpontaneityPropertiesThrombus Aging +---------+---------------+---------+-----------+----------+--------------+ CFV      Full           Yes      Yes                                 +---------+---------------+---------+-----------+----------+--------------+ SFJ      Full                                                        +---------+---------------+---------+-----------+----------+--------------+ FV Prox  Full                                                        +---------+---------------+---------+-----------+----------+--------------+ FV Mid   Full                                                        +---------+---------------+---------+-----------+----------+--------------+ FV DistalFull                                                         +---------+---------------+---------+-----------+----------+--------------+ PFV      Full                                                        +---------+---------------+---------+-----------+----------+--------------+ POP      Full           Yes      Yes                                 +---------+---------------+---------+-----------+----------+--------------+  PTV      Full                                                        +---------+---------------+---------+-----------+----------+--------------+ PERO     Full                                                        +---------+---------------+---------+-----------+----------+--------------+     Summary: RIGHT: - No evidence of common femoral vein obstruction.  LEFT: - There is no evidence of deep vein thrombosis in the lower extremity.  - No cystic structure found in the popliteal fossa.  *See table(s) above for measurements and observations.    Preliminary    ECHOCARDIOGRAM COMPLETE  Result Date: 11/15/2022    ECHOCARDIOGRAM REPORT   Patient Name:   CIANDRA ARSLAN Date of Exam: 11/15/2022 Medical Rec #:  [id number]    Height:       65.0 in Accession #:    [id number]   Weight:       158.7 lb Date of Birth:  04-15-1976   BSA:          1.793 m Patient Age:    69 years     BP:           143/79 mmHg Patient Gender: F            HR:           81 bpm. Exam Location:  Inpatient Procedure: 2D Echo, Cardiac Doppler and Color Doppler Indications:    stroke  History:        Patient has prior history of Echocardiogram examinations. Risk                 Factors:Hypertension, Diabetes and Dyslipidemia.  Sonographer:    Phineas Douglas Referring Phys: [id number] [place]  1. Left ventricular ejection fraction, by estimation, is 60 to 65%. The left ventricle has normal function. The left ventricle has no regional wall motion abnormalities. Left ventricular diastolic parameters are consistent with Grade I diastolic dysfunction (impaired  relaxation).  2. Right ventricular systolic function is normal. The right ventricular size is normal. There is normal pulmonary artery systolic pressure. The estimated right ventricular systolic pressure is [id number] mmHg.  3. Left atrial size was severely dilated.  4. The mitral valve is normal in structure. No evidence of mitral valve regurgitation. No evidence of mitral stenosis.  5. The aortic valve is tricuspid. Aortic valve regurgitation is not visualized. No aortic stenosis is present.  6. The inferior vena cava is normal in size with greater than 50% respiratory variability, suggesting right atrial pressure of 3 mmHg. FINDINGS  Left Ventricle: Left ventricular ejection fraction, by estimation, is 60 to 65%. The left ventricle has normal function. The left ventricle has no regional wall motion abnormalities. The left ventricular internal cavity size was normal in size. There is  no left ventricular hypertrophy. Left ventricular diastolic parameters are consistent with Grade I diastolic dysfunction (impaired relaxation). Right Ventricle: The right ventricular size is normal. No increase in right ventricular wall thickness. Right ventricular systolic function is normal. There is normal pulmonary artery systolic  pressure. The tricuspid regurgitant velocity is 1.98 m/s, and  with an assumed right atrial pressure of 3 mmHg, the estimated right ventricular systolic pressure is [id number] mmHg. Left Atrium: Left atrial size was severely dilated. Right Atrium: Right atrial size was normal in size. Pericardium: Trivial pericardial effusion is present. Mitral Valve: The mitral valve is normal in structure. There is mild calcification of the mitral valve leaflet(s). Mild mitral annular calcification. No evidence of mitral valve regurgitation. No evidence of mitral valve stenosis. Tricuspid Valve: The tricuspid valve is normal in structure. Tricuspid valve regurgitation is trivial. Aortic Valve: The aortic valve is tricuspid.  Aortic valve regurgitation is not visualized. No aortic stenosis is present. Pulmonic Valve: The pulmonic valve was normal in structure. Pulmonic valve regurgitation is not visualized. Aorta: The aortic root is normal in size and structure. Venous: The inferior vena cava is normal in size with greater than 50% respiratory variability, suggesting right atrial pressure of 3 mmHg. IAS/Shunts: No atrial level shunt detected by color flow Doppler.  LEFT VENTRICLE PLAX 2D LVIDd:         4.40 cm      Diastology LVIDs:         2.50 cm      LV e' medial:    7.62 cm/s LV PW:         1.30 cm      LV E/e' medial:  11.9 LV IVS:        1.00 cm      LV e' lateral:   9.79 cm/s LVOT diam:     2.00 cm      LV E/e' lateral: 9.3 LV SV:         78 LV SV Index:   43 LVOT Area:     3.14 cm  LV Volumes (MOD) LV vol d, MOD A2C: 113.0 ml LV vol d, MOD A4C: 106.0 ml LV vol s, MOD A2C: 38.0 ml LV vol s, MOD A4C: 43.5 ml LV SV MOD A2C:     75.0 ml LV SV MOD A4C:     106.0 ml LV SV MOD BP:      72.4 ml RIGHT VENTRICLE             IVC RV Basal diam:  3.60 cm     IVC diam: 1.90 cm RV S prime:     12.10 cm/s TAPSE (M-mode): 2.4 cm LEFT ATRIUM              Index        RIGHT ATRIUM           Index LA diam:        3.60 cm  2.01 cm/m   RA Area:     14.90 cm LA Vol (A2C):   102.0 ml 56.89 ml/m  RA Volume:   37.90 ml  21.14 ml/m LA Vol (A4C):   90.0 ml  50.19 ml/m LA Biplane Vol: 98.8 ml  55.10 ml/m  AORTIC VALVE LVOT Vmax:   104.00 cm/s LVOT Vmean:  67.700 cm/s LVOT VTI:    0.247 m  AORTA Ao Root diam: 2.90 cm Ao Asc diam:  3.00 cm MITRAL VALVE                TRICUSPID VALVE MV Area (PHT): 4.33 cm     TR Peak grad:   15.7 mmHg MV Decel Time: 175 msec     TR Vmax:        198.00 cm/s MV  E velocity: 90.80 cm/s MV A velocity: 100.00 cm/s  SHUNTS MV E/A ratio:  0.91         Systemic VTI:  0.25 m                             Systemic Diam: 2.00 cm Dalton McleanMD Electronically signed by Franki Monte Signature Date/Time: 11/15/2022/2:39:16 PM     Final    VAS US CAROTID  Result Date: 11/15/2022 Carotid Arterial Duplex Study Patient Name:  CLOTINE DOCKERY  Date of Exam:   11/15/2022 Medical Rec #: [id number]     Accession #:    [id number] Date of Birth: 02-23-1976    Patient Gender: F Patient Age:   16 years Exam Location:  [facility] Procedure:      VAS US CAROTID Referring Phys: Rosalin Hawking --------------------------------------------------------------------------------  Performing Technologist: Darlin Coco RDMS, RVT  Examination Guidelines: A complete evaluation includes B-mode imaging, spectral Doppler, color Doppler, and power Doppler as needed of all accessible portions of each vessel. Bilateral testing is considered an integral part of a complete examination. Limited examinations for reoccurring indications may be performed as noted.  Right Carotid Findings: +----------+--------+--------+--------+------------------+--------+           PSV cm/sEDV cm/sStenosisPlaque DescriptionComments +----------+--------+--------+--------+------------------+--------+ CCA Prox  68      14                                         +----------+--------+--------+--------+------------------+--------+ CCA Distal54      14              focal mild                 +----------+--------+--------+--------+------------------+--------+ ICA Prox  54      21                                         +----------+--------+--------+--------+------------------+--------+ ICA Mid   52      15                                         +----------+--------+--------+--------+------------------+--------+ ICA Distal29      9                                          +----------+--------+--------+--------+------------------+--------+ ECA       92      17                                         +----------+--------+--------+--------+------------------+--------+ +----------+--------+-------+--------+-------------------+           PSV cm/sEDV  cmsDescribeArm Pressure (mmHG) +----------+--------+-------+--------+-------------------+ [id number]                                         +----------+--------+-------+--------+-------------------+ +---------+--------+--+--------+--+ VertebralPSV cm/s95EDV cm/s26 +---------+--------+--+--------+--+  Left Carotid Findings: +----------+--------+--------+--------+------------------+--------+  PSV cm/sEDV cm/sStenosisPlaque DescriptionComments +----------+--------+--------+--------+------------------+--------+ CCA Prox  91      16                                         +----------+--------+--------+--------+------------------+--------+ CCA Distal77      17                                         +----------+--------+--------+--------+------------------+--------+ ICA Prox  50      17                                         +----------+--------+--------+--------+------------------+--------+ ICA Mid   135     41                                tortuous +----------+--------+--------+--------+------------------+--------+ ICA Distal100     31                                         +----------+--------+--------+--------+------------------+--------+ ECA       76      14                                         +----------+--------+--------+--------+------------------+--------+ +----------+--------+--------+--------+-------------------+           PSV cm/sEDV cm/sDescribeArm Pressure (mmHG) +----------+--------+--------+--------+-------------------+ [id number]                                         +----------+--------+--------+--------+-------------------+ +---------+--------+--+--------+--+ VertebralPSV cm/s70EDV cm/s21 +---------+--------+--+--------+--+   Summary: Right Carotid: The extracranial vessels were near-normal with only minimal wall                thickening or plaque. Left Carotid: The extracranial vessels were  near-normal with only minimal wall               thickening or plaque. Vertebrals:  Bilateral vertebral arteries demonstrate antegrade flow. Subclavians: Normal flow hemodynamics were seen in bilateral subclavian              arteries. *See table(s) above for measurements and observations.     Preliminary     Labs: BMET Recent Labs  Lab 11/14/22 1013 11/14/22 1014 11/15/22 0618 11/16/22 0352 11/17/22 0617  NA 138 138 139 139 140  K 4.0 3.9 3.4* 4.2 3.9  CL 109 109 110 111 111  CO2  --  17* 22 19* 22  GLUCOSE 93 103* 144* 190* 123*  BUN 37* 35* 40* 44* 47*  CREATININE 4.90* 4.28* 4.36* 4.59* 4.34*  CALCIUM  --  8.1* 7.6* 7.6* 7.6*  PHOS  --   --   --  5.3* 5.1*   CBC Recent Labs  Lab 11/14/22 1014 11/15/22 0618 11/15/22 1138 11/16/22 0352 11/17/22 0617  WBC 16.0* 9.8  --  8.6 10.6*  NEUTROABS 9.7* 5.3  --  5.1  --  HGB 9.8* 7.7* 8.0* 7.5* 6.9*  HCT 29.5* 22.5* 23.8* 22.3* 20.8*  MCV 76.2* 74.0*  --  74.1* 75.4*  PLT 291 228  --  171 204    Medications:     sodium chloride   Intravenous Once   amLODipine  10 mg Oral Daily   buPROPion  150 mg Oral Daily   citalopram  10 mg Oral Daily   darbepoetin (ARANESP) injection - NON-DIALYSIS  60 mcg Subcutaneous Q Sat-1800   insulin aspart  0-9 Units Subcutaneous TID WC   ketotifen  2 drop Both Eyes BID   rosuvastatin  40 mg Oral Daily    Madelon Lips MD 11/17/2022, 9:43 AM

## 2022-11-17 NOTE — Progress Notes (Signed)
ANTICOAGULATION CONSULT NOTE - Follow Up Consult  Pharmacy Consult for Heparin Indication:  nephrotic syndrome, acute CVA  Allergies  Allergen Reactions   Gabapentin     Weakness/impairs balance    Patient Measurements: Height: '5\' 5"'$  (165.1 cm) Weight: 72 kg (158 lb 11.7 oz) IBW/kg (Calculated) : 57 Heparin Dosing Weight: 71.5 kg   Vital Signs: Temp: 98.2 F (36.8 C) (03/10 0802) Temp Source: Oral (03/10 0311) BP: 193/106 (03/10 0802) Pulse Rate: 78 (03/10 0802)  Labs: Recent Labs    11/14/22 1014 11/14/22 1545 11/15/22 0618 11/15/22 1138 11/16/22 0352 11/16/22 1102 11/16/22 1255 11/16/22 2123 11/17/22 0617  HGB 9.8*  --  7.7* 8.0* 7.5*  --   --   --  6.9*  HCT 29.5*  --  22.5* 23.8* 22.3*  --   --   --  20.8*  PLT 291  --  228  --  171  --   --   --  204  APTT 40*  --   --   --   --   --   --   --   --   LABPROT 13.0  --   --   --   --   --   --   --   --   INR 1.0  --   --   --   --   --   --   --   --   HEPARINUNFRC  --   --   --   --   --   --   --  0.16* 0.19*  CREATININE 4.28*  --  4.36*  --  4.59*  --   --   --  4.34*  CKTOTAL  --  349*  --   --   --   --   --   --   --   TROPONINIHS  --   --   --   --   --  14 13  --   --     Estimated Creatinine Clearance: 15.9 mL/min (A) (by C-G formula based on SCr of 4.34 mg/dL (H)).   Medications:  Scheduled:   sodium chloride   Intravenous Once   amLODipine  10 mg Oral Daily   buPROPion  150 mg Oral Daily   citalopram  10 mg Oral Daily   darbepoetin (ARANESP) injection - NON-DIALYSIS  60 mcg Subcutaneous Q Sat-1800   insulin aspart  0-9 Units Subcutaneous TID WC   ketotifen  2 drop Both Eyes BID   rosuvastatin  40 mg Oral Daily   Infusions:   heparin 1,050 Units/hr (11/16/22 2301)    Assessment: 47 yo female with PMH of prior CVAs, HTN, T2DM, now presenting with acute CVA and AKI. Patient was not on anticoagulation PTA. Patient was started on DAPT with aspirin and clopidogrel - neurology ok with  discontinuing DAPT if starting anticoagulation. Baseline Hgb 7.5, plt wnl. Pharmacy consulted for heparin dosing.   Heparin level today is subtherapeutic at 0.19, on 1050 units/hr. Hgb 6.9, plt 204. Per discussion with MD - Hgb drop likely to due to renal disease, rather than an active bleed. No line issues or signs/symptoms of bleeding noted per RN.   Goal of Therapy:  Heparin level 0.3-0.5 units/ml Monitor platelets by anticoagulation protocol: Yes   Plan:  Increase IV heparin to 1200 units/hr. Check ~8 hr heparin level.  Daily CBC, heparin level. Monitor for signs/symptoms of bleeding.   Vance Peper, PharmD PGY-2 Pharmacy  Resident Phone [phone] 11/17/2022 8:13 AM   Please check AMION for all [place] phone numbers After 10:00 PM, call [place] [phone]

## 2022-11-17 NOTE — Plan of Care (Signed)
  Problem: Education: Goal: Knowledge of disease or condition will improve Outcome: Progressing Goal: Knowledge of secondary prevention will improve (MUST DOCUMENT ALL) Outcome: Progressing Goal: Knowledge of patient specific risk factors will improve (Mark N/A or DELETE if not current risk factor) Outcome: Progressing   Problem: Ischemic Stroke/TIA Tissue Perfusion: Goal: Complications of ischemic stroke/TIA will be minimized Outcome: Progressing   Problem: Coping: Goal: Will verbalize positive feelings about self Outcome: Progressing Goal: Will identify appropriate support needs Outcome: Progressing   Problem: Health Behavior/Discharge Planning: Goal: Ability to manage health-related needs will improve Outcome: Progressing Goal: Goals will be collaboratively established with patient/family Outcome: Progressing   Problem: Self-Care: Goal: Ability to participate in self-care as condition permits will improve Outcome: Progressing Goal: Verbalization of feelings and concerns over difficulty with self-care will improve Outcome: Progressing Goal: Ability to communicate needs accurately will improve Outcome: Progressing   Problem: Nutrition: Goal: Risk of aspiration will decrease Outcome: Progressing Goal: Dietary intake will improve Outcome: Progressing   Problem: Education: Goal: Knowledge of General Education information will improve Description: Including pain rating scale, medication(s)/side effects and non-pharmacologic comfort measures Outcome: Progressing   Problem: Health Behavior/Discharge Planning: Goal: Ability to manage health-related needs will improve Outcome: Progressing   Problem: Clinical Measurements: Goal: Ability to maintain clinical measurements within normal limits will improve Outcome: Progressing Goal: Will remain free from infection Outcome: Progressing Goal: Diagnostic test results will improve Outcome: Progressing Goal: Respiratory  complications will improve Outcome: Progressing Goal: Cardiovascular complication will be avoided Outcome: Progressing   Problem: Activity: Goal: Risk for activity intolerance will decrease Outcome: Progressing   Problem: Nutrition: Goal: Adequate nutrition will be maintained Outcome: Progressing   Problem: Coping: Goal: Level of anxiety will decrease Outcome: Progressing   Problem: Elimination: Goal: Will not experience complications related to bowel motility Outcome: Progressing Goal: Will not experience complications related to urinary retention Outcome: Progressing   Problem: Pain Managment: Goal: General experience of comfort will improve Outcome: Progressing   Problem: Safety: Goal: Ability to remain free from injury will improve Outcome: Progressing   Problem: Skin Integrity: Goal: Risk for impaired skin integrity will decrease Outcome: Progressing   Problem: Education: Goal: Ability to describe self-care measures that may prevent or decrease complications (Diabetes Survival Skills Education) will improve Outcome: Progressing Goal: Individualized Educational Video(s) Outcome: Progressing   Problem: Coping: Goal: Ability to adjust to condition or change in health will improve Outcome: Progressing   Problem: Fluid Volume: Goal: Ability to maintain a balanced intake and output will improve Outcome: Progressing   Problem: Health Behavior/Discharge Planning: Goal: Ability to identify and utilize available resources and services will improve Outcome: Progressing Goal: Ability to manage health-related needs will improve Outcome: Progressing   Problem: Metabolic: Goal: Ability to maintain appropriate glucose levels will improve Outcome: Progressing   Problem: Nutritional: Goal: Maintenance of adequate nutrition will improve Outcome: Progressing Goal: Progress toward achieving an optimal weight will improve Outcome: Progressing   Problem: Skin  Integrity: Goal: Risk for impaired skin integrity will decrease Outcome: Progressing   Problem: Tissue Perfusion: Goal: Adequacy of tissue perfusion will improve Outcome: Progressing   

## 2022-11-17 NOTE — Progress Notes (Signed)
Mobility Specialist Progress Note   11/17/22 1015  Mobility  Activity Ambulated with assistance in hallway  Level of Assistance Contact guard assist, steadying assist  Brent wheel walker  Distance Ambulated (ft) 240 ft  Range of Motion/Exercises Active;All extremities  Activity Response Tolerated well   Pre Mobility:  HR 86 During Mobility: HR 93 Post Mobility: HR 88  Patient received in supine and agreeable to participate. Ambulated min guard to supervision level with slow steady gait. Required seated rest break x1 secondary to fatigue. While returning to room, c/o lightheadedness that subsided quickly without intervention. Was left dangling EOB with all needs met, call bell in reach.   [place] Kassim Guertin, BS EXP Mobility Specialist Please contact via SecureChat or Rehab office at [phone]

## 2022-11-18 ENCOUNTER — Other Ambulatory Visit (HOSPITAL_COMMUNITY): Payer: Self-pay

## 2022-11-18 DIAGNOSIS — N049 Nephrotic syndrome with unspecified morphologic changes: Secondary | ICD-10-CM | POA: Diagnosis not present

## 2022-11-18 DIAGNOSIS — N189 Chronic kidney disease, unspecified: Secondary | ICD-10-CM

## 2022-11-18 DIAGNOSIS — N179 Acute kidney failure, unspecified: Secondary | ICD-10-CM | POA: Diagnosis not present

## 2022-11-18 LAB — GLUCOSE, CAPILLARY
Glucose-Capillary: 122 mg/dL — ABNORMAL HIGH (ref 70–99)
Glucose-Capillary: 118 mg/dL — ABNORMAL HIGH (ref 70–99)
Glucose-Capillary: 150 mg/dL — ABNORMAL HIGH (ref 70–99)
Glucose-Capillary: 131 mg/dL — ABNORMAL HIGH (ref 70–99)

## 2022-11-18 LAB — PROTEIN ELECTROPHORESIS, SERUM
A/G Ratio: 0.6 — ABNORMAL LOW (ref 0.7–1.7)
Albumin ELP: 1.8 g/dL — ABNORMAL LOW (ref 2.9–4.4)
Alpha-1-Globulin: 0.3 g/dL (ref 0.0–0.4)
Alpha-2-Globulin: 1.3 g/dL — ABNORMAL HIGH (ref 0.4–1.0)
Beta Globulin: 0.6 g/dL — ABNORMAL LOW (ref 0.7–1.3)
Gamma Globulin: 0.7 g/dL (ref 0.4–1.8)
Globulin, Total: 2.9 g/dL (ref 2.2–3.9)
Total Protein ELP: 4.7 g/dL — ABNORMAL LOW (ref 6.0–8.5)

## 2022-11-18 LAB — HEPARIN LEVEL (UNFRACTIONATED): Heparin Unfractionated: 0.42 IU/mL (ref 0.30–0.70)

## 2022-11-18 LAB — CBC
HCT: 25.8 % — ABNORMAL LOW (ref 36.0–46.0)
Hemoglobin: 8.5 g/dL — ABNORMAL LOW (ref 12.0–15.0)
MCH: 25 pg — ABNORMAL LOW (ref 26.0–34.0)
MCHC: 32.9 g/dL (ref 30.0–36.0)
MCV: 75.9 fL — ABNORMAL LOW (ref 80.0–100.0)
Platelets: 218 10*3/uL (ref 150–400)
RBC: 3.4 MIL/uL — ABNORMAL LOW (ref 3.87–5.11)
RDW: 16.5 % — ABNORMAL HIGH (ref 11.5–15.5)
WBC: 12.1 10*3/uL — ABNORMAL HIGH (ref 4.0–10.5)
nRBC: 0 % (ref 0.0–0.2)

## 2022-11-18 LAB — RENAL FUNCTION PANEL
Albumin: <1.5 g/dL — ABNORMAL LOW (ref 3.5–5.0)
Anion gap: 8 (ref 5–15)
BUN: 44 mg/dL — ABNORMAL HIGH (ref 6–20)
CO2: 22 mmol/L (ref 22–32)
Calcium: 8 mg/dL — ABNORMAL LOW (ref 8.9–10.3)
Chloride: 110 mmol/L (ref 98–111)
Creatinine, Ser: 4.47 mg/dL — ABNORMAL HIGH (ref 0.44–1.00)
GFR, Estimated: 12 mL/min — ABNORMAL LOW (ref >60)
Glucose, Bld: 124 mg/dL — ABNORMAL HIGH (ref 70–99)
Phosphorus: 5.8 mg/dL — ABNORMAL HIGH (ref 2.5–4.6)
Potassium: 3.9 mmol/L (ref 3.5–5.1)
Sodium: 140 mmol/L (ref 135–145)

## 2022-11-18 LAB — TYPE AND SCREEN
ABO/RH(D): O POS
Antibody Screen: NEGATIVE
Unit division: 0

## 2022-11-18 LAB — BPAM RBC
Blood Product Expiration Date: 202404042359
ISSUE DATE / TIME: 202403101106
Unit Type and Rh: 5100

## 2022-11-18 LAB — C3 COMPLEMENT: C3 Complement: 124 mg/dL (ref 82–167)

## 2022-11-18 LAB — KAPPA/LAMBDA LIGHT CHAINS
Kappa free light chain: 118.6 mg/L — ABNORMAL HIGH (ref 3.3–19.4)
Kappa, lambda light chain ratio: 2.47 — ABNORMAL HIGH (ref 0.26–1.65)
Lambda free light chains: 48.1 mg/L — ABNORMAL HIGH (ref 5.7–26.3)

## 2022-11-18 LAB — C4 COMPLEMENT: Complement C4, Body Fluid: 26 mg/dL (ref 12–38)

## 2022-11-18 MED ORDER — CARVEDILOL 3.125 MG PO TABS
3.1250 mg | ORAL_TABLET | Freq: Two times a day (BID) | ORAL | Status: DC
  Administered 2022-11-18 – 2022-11-21 (×6): 3.125 mg via ORAL
  Filled 2022-11-18 (×6): qty 1

## 2022-11-18 NOTE — Progress Notes (Signed)
ANTICOAGULATION CONSULT NOTE - Follow Up Consult  Pharmacy Consult for Heparin Indication:  nephrotic syndrome, acute CVA  Allergies  Allergen Reactions   Gabapentin     Weakness/impairs balance    Patient Measurements: Height: '5\' 5"'$  (165.1 cm) Weight: 72 kg (158 lb 11.7 oz) IBW/kg (Calculated) : 57 Heparin Dosing Weight: 71.5 kg   Vital Signs: Temp: 99.2 F (37.3 C) (03/11 0332) Temp Source: Oral (03/11 0332) BP: 159/73 (03/11 0332) Pulse Rate: 82 (03/11 0332)  Labs: Recent Labs    11/16/22 0352 11/16/22 1102 11/16/22 1255 11/16/22 2123 11/17/22 0617 11/17/22 1548 11/17/22 1743 11/18/22 0409  HGB 7.5*  --   --   --  6.9* 8.5*  --  8.5*  HCT 22.3*  --   --   --  20.8* 24.7*  --  25.8*  PLT 171  --   --   --  204  --   --  218  HEPARINUNFRC  --   --   --    < > 0.19*  --  0.40 0.42  CREATININE 4.59*  --   --   --  4.34*  --   --  4.47*  TROPONINIHS  --  14 13  --   --   --   --   --    < > = values in this interval not displayed.     Estimated Creatinine Clearance: 15.5 mL/min (A) (by C-G formula based on SCr of 4.47 mg/dL (H)).   Medications:  Scheduled:   amLODipine  10 mg Oral Daily   buPROPion  150 mg Oral Daily   citalopram  10 mg Oral Daily   darbepoetin (ARANESP) injection - NON-DIALYSIS  60 mcg Subcutaneous Q Sat-1800   furosemide  40 mg Oral Daily   insulin aspart  0-9 Units Subcutaneous TID WC   ketotifen  2 drop Both Eyes BID   rosuvastatin  40 mg Oral Daily   Infusions:   heparin 1,200 Units/hr (11/17/22 1045)    Assessment: 47 yo female with PMH of prior CVAs, HTN, T2DM, now presenting with acute CVA and nephrotic syndrome. Patient was not on anticoagulation PTA. Patient was started on DAPT with aspirin and clopidogrel - neurology ok with discontinuing DAPT if starting anticoagulation.  Pharmacy consulted for heparin for nephrotic syndrome.   Heparin level 0.42 is therapeutic on 1200units/hr.   Goal of Therapy:  Heparin level 0.3-0.5  units/ml Monitor platelets by anticoagulation protocol: Yes   Plan:  Continue IV heparin at 1200 units/hr.  Daily CBC, heparin level. Monitor for signs/symptoms of bleeding. F/u switch to [place], PharmD, BCPS, [facility] Clinical Pharmacist  Please check AMION for all [place] phone numbers After 10:00 PM, call [place] [phone]

## 2022-11-18 NOTE — Progress Notes (Addendum)
Subjective:   Summary: Stacey Lucas is a 47 y.o. year old female currently admitted on the IMTS HD#4 for acute CVA and nephrotic syndrome.  Overnight Events: NAEON. She has been persistently hypertensive since admission.   She says her chest pain is still occurring, but feels better after her transfusion yesterday. She also mentions peeing well overnight.   Her dexterity is improving. We were able to play a game of cards and she was able to grab a single card off of a stack about 50% of the time without assistance.   Objective:  Vital signs in last 24 hours: Vitals:   11/17/22 1343 11/17/22 1944 11/17/22 2308 11/18/22 0332  BP: (!) 159/88 (!) 146/79 (!) 141/73 (!) 159/73  Pulse: 82 86 86 82  Resp: '18 18 16 16  '$ Temp: 98.6 F (37 C) 98.5 F (36.9 C) 98.2 F (36.8 C) 99.2 F (37.3 C)  TempSrc: Oral Oral Oral Oral  SpO2: 99% 98% 100% 100%  Weight:      Height:       Supplemental O2: Room Air SpO2: 100 % O2 Flow Rate (L/min): 200 L/min   Physical Exam:  Constitutional: sitting in bed, in no acute distress Cardiovascular: RRR, no murmurs, rubs or gallops Pulmonary/Chest: normal work of breathing on room air, lungs clear to auscultation bilaterally Abdominal: soft, non-tender, non-distended Skin: warm and dry Extremities:  Alert and oriented x3. No uvular or tongue deviation.bilateral sensation is intact on upper and lower extremities. Dysarthria is improving. 4/5 strength in right grip, dorsiflexion, otherwise 5/5. Right side dysmetria. Left side facial droop less pronounced. Edema has decreased in lower extremities and in her face.  Filed Weights   11/14/22 1000  Weight: 72 kg     Intake/Output Summary (Last 24 hours) at 11/18/2022 0729 Last data filed at 11/17/2022 1342 Gross per 24 hour  Intake 1001.67 ml  Output --  Net 1001.67 ml   Net IO Since Admission: 1,547.02 mL [11/18/22 0729]  Pertinent Labs:    Latest Ref Rng & Units 11/18/2022     4:09 AM 11/17/2022    3:48 PM 11/17/2022    6:17 AM  CBC  WBC 4.0 - 10.5 K/uL 12.1   10.6   Hemoglobin 12.0 - 15.0 g/dL 8.5  8.5  6.9   Hematocrit 36.0 - 46.0 % 25.8  24.7  20.8   Platelets 150 - 400 K/uL 218   204        Latest Ref Rng & Units 11/18/2022    4:09 AM 11/17/2022    6:17 AM 11/16/2022    3:52 AM  CMP  Glucose 70 - 99 mg/dL 124  123  190   BUN 6 - 20 mg/dL 44  47  44   Creatinine 0.44 - 1.00 mg/dL 4.47  4.34  4.59   Sodium 135 - 145 mmol/L 140  140  139   Potassium 3.5 - 5.1 mmol/L 3.9  3.9  4.2   Chloride 98 - 111 mmol/L 110  111  111   CO2 22 - 32 mmol/L '22  22  19   '$ Calcium 8.9 - 10.3 mg/dL 8.0  7.6  7.6     I personally reviewed interval labs and pertinent results include: microcytic anemia, uremia, leukocytosis.   Assessment/Plan:   Patient Summary: Stacey Lucas is a 46 y.o. with a pertinent PMH of prior CVAs, HTN, T2DM, polysubstance use, who  presented with slurred speech and new right sided weakness and admitted for acute left pontine CVA and nephrotic syndrome.    #Nephrotic Syndrome #AKI on CKD  With her persistently low albumin (<1.5), high proteinuria on 24hr urine protein (>6 grams), family history, and lower extremity and facial edema, nephrotic syndrome is confirmed. Nephrology started her on lasix for diuresis - her net in/out is -215 mL. Will work with nursing to ensure strict I/Os.Her GFR is still 12, and her creatinine is 4.4 (up from 1.6 9 months ago) which is being treated as an AKI due to unknown time course of her elevated creatinine. Upon discharge, she could benefit from outpatient follow up within 3 months to stage her CKD.  She is also being worked up for multiple myeloma due to her CRAB criteria, and serum protein gap. - lasix '40mg'$  - nephro waiting on multiple myeloma UPEP, SPEP, light chain to guide decision to perform renal biopsy(tentatively 3/12 with IR) - f/u complement studies  #Acute left pontine CVA Patient's symptoms are improving  to what she believes is her baseline. She was switched from ASA and Plavix to heparin as a protective measure against her hypercoagulability from nephrotic syndrome. Given the pontine location it seems that her CVA is ischemic, followed up with Dr. Erlinda Hong in regards to anticoagulation and says heprain ggt with transition to [place] is fine instead of DAPT .There is still concern for afib because of left atrial enlargement on echo. No afib on this admission. Do not suspect embolic stroke from afib. She is on telemetry to monitor for cardiac arrhythmias. - heparin 100 units/mL dosed by pharmacy  - appreciate PT recs - telemetry to monitor for A fib, 30 day cardiac monitor outpatient - appreciate speech language pathology recs - counsel on polysubstance use cessation  #Chest Pain Intermittent pleuritic chest pain has been occurring since Saturday, but she says it has improved following her blood transfusion. She has an unremarkable EKG, negative DVT and ruled out PE. Does have elevated BUN to 44, could be uremic pericarditis but pain is only intermittent at this time and given renal function she is not a candidate for NSAIDs. Will keep this etiology on the differential. Continue to reassess patient regarding chest pain quality and if it worsens in severity.    #Microcytic Anemia I think her microcytic anemia is partially due to a dilutional component from the IV Fluids she received during admission, but more significantly from her CKD. Her GFR is 12. Nephro started her on an aranesp dose which shouldn't significantly increase coagulability risk unless she becomes polycythemic. Her hemoglobin dropped below 7 yesterday morning, and she was transfused with 1 unit pure RBCs. Repeat CBCs daily to monitor her hemoglobin and if it drops below 7 again, need to retransfuse. Hgb stable at 8.5 today. - Aranesp 60 mcg - repeat CBCs daily - transfuse if hemoglobin <7  #T2DM She is euglycemic at her last reading of glucose  122.  - SSI  #HTN Patient continues to be hypertensive with systolics to the [id number] in the setting of ischemic stroke. On metoprolol at home, will start amlodipine. - amlodipine 10 mg  - carvedilol 3.125 mg  #Hyperlipidemia - Crestor 40 mg  #Asymptomatic Pyuria - no treatment indicated due to no symptoms.   #Depression - continue celexa and wellbutrin  Diet: Normal IVF: None VTE: Heparin Code: Full PT/OT recs: [place] Student

## 2022-11-18 NOTE — Progress Notes (Signed)
Interventional Radiology Brief Note:  Request for random renal biopsy noted.  Anticipate procedure as early as Wednesday pending scheduling/IR availability.  Formal consult to follow.   Dr. Marlou Sa made aware.   Brynda Greathouse, MS RD PA-C

## 2022-11-18 NOTE — Progress Notes (Signed)
Physical Therapy Treatment Patient Details Name: Stacey Lucas MRN: [id number] DOB: 1975/09/22 Today's Date: 11/18/2022   History of Present Illness 47 yo female presents [facility] on 3/7 with R weakness, slurred speech. MRI brain shows Small acute infarct in the left pons. PMH includes PMH significant for previous stroke in 2023 (L basal ganglia), anxiety/depression/suicidal ideation, diabetes, hyperlipidemia, hypertension, migraines, cocaine abuse.    PT Comments    Pt progressing steadily towards her physical therapy goals; reports improved energy post blood transfusion. Pt ambulating 130 ft x 2 with a Rollator at a supervision level. Trialed single UE support on hall railing with good success. Will benefit from HHPT to address deficits.    Recommendations for follow up therapy are one component of a multi-disciplinary discharge planning process, led by the attending physician.  Recommendations may be updated based on patient status, additional functional criteria and insurance authorization.  Follow Up Recommendations  Home health PT     Assistance Recommended at Discharge PRN  Patient can return home with the following A little help with walking and/or transfers;A little help with bathing/dressing/bathroom   Equipment Recommendations  Other (comment) (Rollator needs a new part vs needs a new Rollator)    Recommendations for Other Services       Precautions / Restrictions Precautions Precautions: Fall Restrictions Weight Bearing Restrictions: No     Mobility  Bed Mobility Overal bed mobility: Independent                  Transfers Overall transfer level: Needs assistance Equipment used: Rollator (4 wheels) Transfers: Sit to/from Stand Sit to Stand: Supervision                Ambulation/Gait Ambulation/Gait assistance: Supervision Gait Distance (Feet): 260 Feet (130", 130") Assistive device: Rollator (4 wheels) (L railing) Gait Pattern/deviations:  Decreased dorsiflexion - right, Trunk flexed, Knee hyperextension - right, Step-through pattern, Decreased step length - right, Decreased dorsiflexion - left Gait velocity: decreased     General Gait Details: Improved foot clearance. Ambulating 130 ft x 2 with Rollator and one seated rest break in between bouts. Ambulating on L and R hall railing to practice unilateral hand support   Stairs             Wheelchair Mobility    Modified Rankin (Stroke Patients Only) Modified Rankin (Stroke Patients Only) Pre-Morbid Rankin Score: Slight disability Modified Rankin: Moderately severe disability     Balance Overall balance assessment: Needs assistance Sitting-balance support: No upper extremity supported, Feet supported Sitting balance-Leahy Scale: Good     Standing balance support: Bilateral upper extremity supported Standing balance-Leahy Scale: Poor                              Cognition Arousal/Alertness: Awake/alert Behavior During Therapy: WFL for tasks assessed/performed Overall Cognitive Status: Within Functional Limits for tasks assessed                                          Exercises      General Comments        Pertinent Vitals/Pain Pain Assessment Pain Assessment: No/denies pain    Home Living                          Prior Function  PT Goals (current goals can now be found in the care plan section) Acute Rehab PT Goals Potential to Achieve Goals: Good Progress towards PT goals: Progressing toward goals    Frequency    Min 4X/week      PT Plan Current plan remains appropriate    Co-evaluation              AM-PAC PT "6 Clicks" Mobility   Outcome Measure  Help needed turning from your back to your side while in a flat bed without using bedrails?: None Help needed moving from lying on your back to sitting on the side of a flat bed without using bedrails?: None Help needed  moving to and from a bed to a chair (including a wheelchair)?: A Little Help needed standing up from a chair using your arms (e.g., wheelchair or bedside chair)?: A Little Help needed to walk in hospital room?: A Little Help needed climbing 3-5 steps with a railing? : A Little 6 Click Score: 20    End of Session Equipment Utilized During Treatment: Gait belt Activity Tolerance: Patient tolerated treatment well Patient left: in bed;with call bell/phone within reach;with bed alarm set Nurse Communication: Mobility status PT Visit Diagnosis: Other abnormalities of gait and mobility (R26.89);Muscle weakness (generalized) (M62.81)     Time: [id number] PT Time Calculation (min) (ACUTE ONLY): 25 min  Charges:  $Therapeutic Activity: 23-37 mins                     Wyona Almas, PT, [name] Acute Rehabilitation Services Office [phone]    Deno Etienne 11/18/2022, 5:07 PM

## 2022-11-18 NOTE — Progress Notes (Signed)
Mobility Specialist - Progress Note   11/18/22 1617  Mobility  Activity Ambulated with assistance in hallway  Level of Assistance Minimal assist, patient does 75% or more  Assistive Device Front wheel walker  Distance Ambulated (ft) 240 ft  Activity Response Tolerated well  Mobility Referral Yes  $Mobility charge 1 Mobility   Pt was received in bed and agreeable to session. Pt required x1 standing rest break d/t fatigue. No other faults throughout session. Pt was returned to bed with all needs met.  Franki Monte  Mobility Specialist Please contact via [place] or Rehab office at [phone]

## 2022-11-18 NOTE — TOC Benefit Eligibility Note (Signed)
Patient Teacher, English as a foreign language completed.    The patient is currently admitted and upon discharge could be taking Eliquis 5 mg.  The current 30 day co-pay is $4.00.   The patient is insured through [place] and [place], [place] Patient Advocate Specialist [place] Patient Advocate Team Direct Number: [phone]  Fax: [phone]

## 2022-11-18 NOTE — TOC Progression Note (Signed)
Transition of Care [facility]) - Progression Note    Patient Details  Name: Stacey Lucas MRN: [id number] Date of Birth: 10/27/1975  [facility] of [facility]) CM/SW Contact  Pollie Friar, RN Phone Number: 11/18/2022, 1:12 PM  Clinical Narrative:    SDOH Interventions Today    Flowsheet Row Most Recent Value  SDOH Interventions   Food Insecurity Interventions Inpatient TOC  Transportation Interventions Inpatient TOC  Utilities Interventions Inpatient TOC       1--pt provided with information on food banks in her area 2-CM has placed number for her medicaid transportation service on the AVS 3-CM has provided her with resources for assistance with bills/ utilities  TOC following   Expected Discharge Plan: [place] Barriers to Discharge: Continued Medical Work up  Expected Discharge Plan and Services   Discharge Planning Services: CM Consult Post Acute Care Choice: [place] arrangements for the past 2 months: Apartment                           HH Arranged: PT, OT, Speech Therapy HH Agency: [place] Date [facility] Agency Contacted: 11/15/22   Representative spoke with at [place]: [place] ([place]) Interventions SDOH Screenings   Food Insecurity: Food Insecurity Present (11/14/2022)  Housing: Medium Risk (11/14/2022)  Transportation Needs: Unmet Transportation Needs (11/14/2022)  Utilities: At Risk (11/14/2022)  Alcohol Screen: Low Risk  (04/25/2018)  Depression (PHQ2-9): Low Risk  (04/18/2022)  Tobacco Use: High Risk (11/14/2022)    Readmission Risk Interventions     No data to display

## 2022-11-18 NOTE — Progress Notes (Signed)
Stacey Lucas Progress Note   Assessment/ Plan:   47 year old female with history of CVA, diabetes, and hypertension admitted for acute left pontine infarct, found to have severe AKI with proteinuria, hypoalbuminemia, and edema suggestive of nephrotic syndrome.   Nephrotic syndrome with AKI on CKD Concern for nephrotic syndrome given resulted labs thus far, severe hypoalbuminemia, edema. Patient had malaise, fatigue, peripheral edema, facial edema, foamy urine prior to this hospitalization. Pertinent lab findings thus far include HIV screen negative, HCV screen negative, HBV immune, 24h urine with 6 g protein. HbA1c have historically been elevated.  Several pending labs: C3, C4, kappa/lambda light chain, UPEP/UIFE/Light chains/TP, protein electrophoresis, immunofixation.  AM labs reviewed:  Albumin <1.5  Cr 4.47 GFR 12  Phosphorus 5.8 No measured urine output over last 24h but 4 episodes of unmeasured UOP.  -Is on not ACEI/ARB therapy, presumably due to AKI -Is on lasix 40 mg daily for diuresis -Is on crestor for hyperlipidemia -Is on heparin gtt for hypercoagulability -Will proceed with IR consult for renal biopsy; plan to transition back to Eliquis once this is done -F/u remaining serology results; ANA and ANCA ordered today   Acute left pontine infarct Several risk factors despite young age: poorly controlled diabetes, HTN, HLD, cocaine use disorder. Concern for hypercoagulable state in setting of nephrotic syndrome in addition to atrial fibrillation increasing risk of embolic stroke. Anticoagulation pending further results from nephrotic syndrome work up and decision regarding renal biopsy. Is on heparin gtt at this time.  Anemia Hgb stable. Iron replete, low reticulocyte index. Suspect due to renal disease. -S/p ESA 11/16/22   Diabetes -Management per primary team.   Hypertension -Management per primary team.  Subjective:   Patient has no complaints this  morning. Understands need for renal biopsy and is in agreement.    Objective:   BP (!) 141/99 (BP Location: Left Arm)   Pulse 78   Temp 98.9 F (37.2 C) (Oral)   Resp 16   Ht '5\' 5"'$  (1.651 m)   Wt 72 kg   LMP 10/11/2019   SpO2 100%   BMI 26.41 kg/m   Intake/Output Summary (Last 24 hours) at 11/18/2022 0814 Last data filed at 11/17/2022 1342 Gross per 24 hour  Intake 1001.67 ml  Output --  Net 1001.67 ml    Weight change:   Physical Exam: NAD, resting comfortably in bed. Negative for peripheral edema. Normal work of breathing on room air. Skin is warm and dry. Normal mood and affect.  Imaging: VAS [place] LOWER EXTREMITY VENOUS (DVT)  Result Date: 11/17/2022  Lower Venous DVT Study Patient Name:  Stacey Lucas  Date of Exam:   11/16/2022 Medical Rec #: [id number]     Accession #:    [id number] Date of Birth: [birth year]    Patient Gender: F Patient Age:   1 years Exam Location:  [facility] Procedure:      VAS [place] LOWER EXTREMITY VENOUS (DVT) Referring Phys: JULIE MACHEN --------------------------------------------------------------------------------  Indications: Swelling.  Risk Factors: None identified. Comparison Study: No prior studies. Performing Technologist: Oliver Hum RVT  Examination Guidelines: A complete evaluation includes B-mode imaging, spectral Doppler, color Doppler, and power Doppler as needed of all accessible portions of each vessel. Bilateral testing is considered an integral part of a complete examination. Limited examinations for reoccurring indications may be performed as noted. The reflux portion of the exam is performed with the patient in reverse Trendelenburg.  +-----+---------------+---------+-----------+----------+--------------+ RIGHTCompressibilityPhasicitySpontaneityPropertiesThrombus Aging +-----+---------------+---------+-----------+----------+--------------+ CFV  Full  Yes      Yes                                  +-----+---------------+---------+-----------+----------+--------------+   +---------+---------------+---------+-----------+----------+--------------+ LEFT     CompressibilityPhasicitySpontaneityPropertiesThrombus Aging +---------+---------------+---------+-----------+----------+--------------+ CFV      Full           Yes      Yes                                 +---------+---------------+---------+-----------+----------+--------------+ SFJ      Full                                                        +---------+---------------+---------+-----------+----------+--------------+ FV Prox  Full                                                        +---------+---------------+---------+-----------+----------+--------------+ FV Mid   Full                                                        +---------+---------------+---------+-----------+----------+--------------+ FV DistalFull                                                        +---------+---------------+---------+-----------+----------+--------------+ PFV      Full                                                        +---------+---------------+---------+-----------+----------+--------------+ POP      Full           Yes      Yes                                 +---------+---------------+---------+-----------+----------+--------------+ PTV      Full                                                        +---------+---------------+---------+-----------+----------+--------------+ PERO     Full                                                        +---------+---------------+---------+-----------+----------+--------------+    Summary:  RIGHT: - No evidence of common femoral vein obstruction.  LEFT: - There is no evidence of deep vein thrombosis in the lower extremity.  - No cystic structure found in the popliteal fossa.  *See table(s) above for measurements and observations. Electronically signed  by Jamelle Haring on 11/17/2022 at 4:28:17 PM.    Final    NM Pulmonary Perfusion  Result Date: 11/16/2022 CLINICAL DATA:  High probability pulmonary embolism. EXAM: NUCLEAR MEDICINE PERFUSION LUNG SCAN TECHNIQUE: Perfusion images were obtained in multiple projections after intravenous injection of radiopharmaceutical. Ventilation scans intentionally deferred if perfusion scan and chest x-ray adequate for interpretation during COVID 19 epidemic. RADIOPHARMACEUTICALS:  3.8 mCi Tc-38mMAA IV COMPARISON:  Chest x-ray 11/16/2022 FINDINGS: There is normal perfusion in all projections. No wedge shaped pleural-based defects are identified. IMPRESSION: Normal perfusion. Electronically Signed   By: ENolon NationsM.D.   On: 11/16/2022 14:10   DG CHEST PORT 1 VIEW  Result Date: 11/16/2022 CLINICAL DATA:  Shortness of breath. EXAM: PORTABLE CHEST 1 VIEW COMPARISON:  Radiograph 2 days ago 11/14/2022 FINDINGS: The cardiomediastinal contours are normal. Minimal right infrahilar atelectasis is new from prior exam. Pulmonary vasculature is normal. No consolidation, pleural effusion, or pneumothorax. No acute osseous abnormalities are seen. IMPRESSION: Minimal right infrahilar atelectasis, new from prior exam. No other acute findings. Electronically Signed   By: MKeith RakeM.D.   On: 11/16/2022 12:37    Labs: BMET Recent Labs  Lab 11/14/22 1013 11/14/22 1014 11/15/22 0618 11/16/22 0352 11/17/22 0617 11/18/22 0409  NA 138 138 139 139 140 140  K 4.0 3.9 3.4* 4.2 3.9 3.9  CL 109 109 110 111 111 110  CO2  --  17* 22 19* 22 22  GLUCOSE 93 103* 144* 190* 123* 124*  BUN 37* 35* 40* 44* 47* 44*  CREATININE 4.90* 4.28* 4.36* 4.59* 4.34* 4.47*  CALCIUM  --  8.1* 7.6* 7.6* 7.6* 8.0*  PHOS  --   --   --  5.3* 5.1* 5.8*    CBC Recent Labs  Lab 11/14/22 1014 11/15/22 0618 11/15/22 1138 11/16/22 0352 11/17/22 0617 11/17/22 1548 11/18/22 0409  WBC 16.0* 9.8  --  8.6 10.6*  --  12.1*  NEUTROABS 9.7* 5.3   --  5.1  --   --   --   HGB 9.8* 7.7*   < > 7.5* 6.9* 8.5* 8.5*  HCT 29.5* 22.5*   < > 22.3* 20.8* 24.7* 25.8*  MCV 76.2* 74.0*  --  74.1* 75.4*  --  75.9*  PLT 291 228  --  171 204  --  218   < > = values in this interval not displayed.     Medications:     amLODipine  10 mg Oral Daily   buPROPion  150 mg Oral Daily   citalopram  10 mg Oral Daily   darbepoetin (ARANESP) injection - NON-DIALYSIS  60 mcg Subcutaneous Q Sat-1800   furosemide  40 mg Oral Daily   insulin aspart  0-9 Units Subcutaneous TID WC   ketotifen  2 drop Both Eyes BID   rosuvastatin  40 mg Oral Daily    EFarrel Gordon DO Internal Medicine PGY-2 11/18/2022, 8:14 AM

## 2022-11-19 ENCOUNTER — Encounter (HOSPITAL_COMMUNITY): Payer: Self-pay | Admitting: Internal Medicine

## 2022-11-19 DIAGNOSIS — N0429 Other nephrotic syndrome with diffuse membranous glomerulonephritis: Secondary | ICD-10-CM

## 2022-11-19 DIAGNOSIS — N189 Chronic kidney disease, unspecified: Secondary | ICD-10-CM | POA: Diagnosis not present

## 2022-11-19 DIAGNOSIS — N179 Acute kidney failure, unspecified: Secondary | ICD-10-CM | POA: Diagnosis not present

## 2022-11-19 LAB — CBC
HCT: 24.8 % — ABNORMAL LOW (ref 36.0–46.0)
Hemoglobin: 7.9 g/dL — ABNORMAL LOW (ref 12.0–15.0)
MCH: 24.5 pg — ABNORMAL LOW (ref 26.0–34.0)
MCHC: 31.9 g/dL (ref 30.0–36.0)
MCV: 77 fL — ABNORMAL LOW (ref 80.0–100.0)
Platelets: 212 10*3/uL (ref 150–400)
RBC: 3.22 MIL/uL — ABNORMAL LOW (ref 3.87–5.11)
RDW: 16.6 % — ABNORMAL HIGH (ref 11.5–15.5)
WBC: 10.5 10*3/uL (ref 4.0–10.5)
nRBC: 0 % (ref 0.0–0.2)

## 2022-11-19 LAB — UPEP/UIFE/LIGHT CHAINS/TP, 24-HR UR
% BETA, Urine: 13.3 %
ALPHA 1 URINE: 9.1 %
Albumin, U: 52.4 %
Alpha 2, Urine: 11.1 %
Free Kappa Lt Chains,Ur: 263.52 mg/L — ABNORMAL HIGH (ref 1.17–86.46)
Free Kappa/Lambda Ratio: 3.61 (ref 1.83–14.26)
Free Lambda Lt Chains,Ur: 73.02 mg/L — ABNORMAL HIGH (ref 0.27–15.21)
GAMMA GLOBULIN URINE: 14.1 %
Total Protein, Urine-Ur/day: 7592 mg/24 hr — ABNORMAL HIGH (ref 30–150)
Total Protein, Urine: 1518.3 mg/dL
Total Volume: 500

## 2022-11-19 LAB — RENAL FUNCTION PANEL
Albumin: <1.5 g/dL — ABNORMAL LOW (ref 3.5–5.0)
Anion gap: 7 (ref 5–15)
BUN: 44 mg/dL — ABNORMAL HIGH (ref 6–20)
CO2: 21 mmol/L — ABNORMAL LOW (ref 22–32)
Calcium: 7.7 mg/dL — ABNORMAL LOW (ref 8.9–10.3)
Chloride: 110 mmol/L (ref 98–111)
Creatinine, Ser: 4.34 mg/dL — ABNORMAL HIGH (ref 0.44–1.00)
GFR, Estimated: 12 mL/min — ABNORMAL LOW (ref >60)
Glucose, Bld: 165 mg/dL — ABNORMAL HIGH (ref 70–99)
Phosphorus: 5.9 mg/dL — ABNORMAL HIGH (ref 2.5–4.6)
Potassium: 3.8 mmol/L (ref 3.5–5.1)
Sodium: 138 mmol/L (ref 135–145)

## 2022-11-19 LAB — GLUCOSE, CAPILLARY
Glucose-Capillary: 118 mg/dL — ABNORMAL HIGH (ref 70–99)
Glucose-Capillary: 154 mg/dL — ABNORMAL HIGH (ref 70–99)
Glucose-Capillary: 67 mg/dL — ABNORMAL LOW (ref 70–99)
Glucose-Capillary: 134 mg/dL — ABNORMAL HIGH (ref 70–99)
Glucose-Capillary: 181 mg/dL — ABNORMAL HIGH (ref 70–99)

## 2022-11-19 LAB — ANCA PROFILE
Anti-MPO Antibodies: <0.2 units (ref 0.0–0.9)
Anti-PR3 Antibodies: <0.2 units (ref 0.0–0.9)
Atypical P-ANCA titer: <1:20 {titer}
C-ANCA: <1:20 {titer}
P-ANCA: <1:20 {titer}

## 2022-11-19 LAB — HEPARIN LEVEL (UNFRACTIONATED)
Heparin Unfractionated: 0.29 IU/mL — ABNORMAL LOW (ref 0.30–0.70)
Heparin Unfractionated: 0.41 IU/mL (ref 0.30–0.70)

## 2022-11-19 LAB — ANA W/REFLEX IF POSITIVE: Anti Nuclear Antibody (ANA): NEGATIVE

## 2022-11-19 MED ORDER — ATORVASTATIN CALCIUM 80 MG PO TABS
80.0000 mg | ORAL_TABLET | Freq: Every day | ORAL | Status: DC
  Administered 2022-11-20 – 2022-11-21 (×2): 80 mg via ORAL
  Filled 2022-11-19 (×3): qty 1

## 2022-11-19 MED ORDER — GLUCOSE 40 % PO GEL
1.0000 | ORAL | Status: AC
  Administered 2022-11-19: 31 g via ORAL
  Filled 2022-11-19: qty 1.21

## 2022-11-19 NOTE — Consult Note (Signed)
Chief Complaint: Patient was seen in consultation today for nephrotic syndrome--- random renal biopsy Chief Complaint  Patient presents with   Code Stroke   at the request of Dr Shirl Harris  Supervising Physician: Corrie Mckusick  Patient Status: [facility] - In-pt  History of Present Illness: Stacey Lucas is a 47 y.o. female   Chronic proteinuria; increasing creatinine Hx CVA; HTN; AKI; DM Nephrotic syndrome with AKI/CKD Hypoalbuminemia Acute left pontine infarct On heparin for hypercoagulability  Nephrology requesting random renal biopsy Scheduled for 11/20/22 in IR   Past Medical History:  Diagnosis Date   Anxiety and depression    Cocaine abuse ([place])    Depression with suicidal ideation    [facility] admission 04/2018   Diabetes mellitus without complication (HCC)    Type II   GERD (gastroesophageal reflux disease)    Hyperlipidemia    Hypertension    Impingement syndrome of right shoulder    Migraine headache    Osteoarthritis of right knee 05/24/2021   Pilonidal abscess 05/2013   Pyelonephritis    Right thyroid nodule    Sciatica     Past Surgical History:  Procedure Laterality Date   BUBBLE STUDY  01/01/2022   Procedure: BUBBLE STUDY;  Surgeon: Janina Mayo, MD;  Location: [place];  Service: Cardiovascular;;   TEE WITHOUT CARDIOVERSION N/A 01/01/2022   Procedure: TRANSESOPHAGEAL ECHOCARDIOGRAM (TEE);  Surgeon: Janina Mayo, MD;  Location: [facility] ENDOSCOPY;  Service: Cardiovascular;  Laterality: N/A;   tubal      Allergies: Gabapentin  Medications: Prior to Admission medications   Medication Sig Start Date End Date Taking? Authorizing Provider  amLODipine (NORVASC) 10 MG tablet Take 1 tablet (10 mg total) by mouth daily. 04/18/22  Yes Elsie Stain, MD  aspirin EC 81 MG tablet Take 1 tablet (81 mg total) by mouth daily. Swallow whole. 04/18/22  Yes Elsie Stain, MD  buPROPion (WELLBUTRIN XL) 150 MG 24 hr tablet TAKE 1 TABLET(150 MG) BY MOUTH  DAILY Patient taking differently: Take 150 mg by mouth daily. 04/18/22  Yes Elsie Stain, MD  citalopram (CELEXA) 10 MG tablet Take 1 tablet (10 mg total) by mouth daily. 11/14/22  Yes Argentina Donovan, PA-C  clopidogrel (PLAVIX) 75 MG tablet TAKE 1 TABLET(75 MG) BY MOUTH DAILY Patient taking differently: Take 75 mg by mouth once. 05/15/22  Yes Elsie Stain, MD  glimepiride (AMARYL) 2 MG tablet Take 1 tablet (2 mg total) by mouth daily with breakfast. 11/14/22  Yes McClung, Angela M, PA-C  Insulin Glargine (BASAGLAR KWIKPEN) 100 UNIT/ML Inject 5 Units into the skin daily. 04/18/22  Yes Elsie Stain, MD  irbesartan (AVAPRO) 75 MG tablet Take 1 tablet (75 mg total) by mouth daily. 11/14/22  Yes Freeman Caldron M, PA-C  metoprolol succinate (TOPROL-XL) 25 MG 24 hr tablet TAKE 3 TABLETS(75 MG) BY MOUTH DAILY Patient taking differently: Take 25 mg by mouth in the morning, at noon, and at bedtime. 08/16/22  Yes Elsie Stain, MD  pantoprazole (PROTONIX) 40 MG tablet Take 40 mg by mouth daily. 06/13/22  Yes [provider]  rosuvastatin (CRESTOR) 20 MG tablet TAKE 1 TABLET(20 MG) BY MOUTH AT BEDTIME Patient taking differently: Take 20 mg by mouth at bedtime. 11/05/22  Yes Elsie Stain, MD  ACCU-CHEK GUIDE test strip USE UP TO FOUR TIMES DAILY AS DIRECTED 01/31/22   [provider]  Accu-Chek Softclix Lancets lancets SMARTSIG:Topical 1-4 Times Daily 01/31/22   [provider]  azelastine (OPTIVAR) 0.05 % ophthalmic solution Place 1 drop into both eyes 2 (two) times daily. Patient not taking: Reported on 11/16/2022 08/21/22   Brunetta Jeans, PA-C  blood glucose meter kit and supplies KIT Dispense based on patient and insurance preference. Use up to four times daily as directed. 01/31/22   [name], MD  Insulin Pen Needle 32G X 4 MM MISC Use to inject insulin at bed time 04/18/22   Elsie Stain, MD  meloxicam (MOBIC) 15 MG tablet TAKE 1 TABLET(15 MG) BY  MOUTH DAILY Patient not taking: Reported on 11/16/2022 07/01/22   Elsie Stain, MD     Family History  Problem Relation Age of Onset   Diabetes Mother    Hypertension Mother    Arthritis Mother    Diabetes Father    Hypertension Father     Social History   Socioeconomic History   Marital status: Single    Spouse name: Not on file   Number of children: 3   Years of education: Not on file   Highest education level: Not on file  Occupational History   Not on file  Tobacco Use   Smoking status: Every Day    Packs/day: 1.00    Types: Cigarettes    Last attempt to quit: 01/16/2022    Years since quitting: 0.8    Passive exposure: Current   Smokeless tobacco: Never  Vaping Use   Vaping Use: Never used  Substance and Sexual Activity   Alcohol use: Yes    Comment: occasionally   Drug use: No   Sexual activity: Yes    Birth control/protection: None  Other Topics Concern   Not on file  Social History Narrative   Lives with children   "lots of caffeine"   Social Determinants of Health   Financial Resource Strain: Not on file  Food Insecurity: Food Insecurity Present (11/14/2022)   Hunger Vital Sign    Worried About Running Out of Food in the Last Year: Often true    Ran Out of Food in the Last Year: Often true  Transportation Needs: Unmet Transportation Needs (11/14/2022)   PRAPARE - Hydrologist (Medical): Yes    Lack of Transportation (Non-Medical): Yes  Physical Activity: Not on file  Stress: Not on file  Social Connections: Not on file    Review of Systems: A 12 point ROS discussed and pertinent positives are indicated in the HPI above.  All other systems are negative.  Review of Systems  Constitutional:  Positive for activity change.  Respiratory:  Negative for cough and shortness of breath.   Gastrointestinal:  Positive for nausea. Negative for abdominal pain.  Neurological:  Positive for weakness.  Psychiatric/Behavioral:   Negative for behavioral problems and confusion.     Vital Signs: BP 130/83 (BP Location: Left Arm)   Pulse 74   Temp 98.6 F (37 C) (Oral)   Resp 14   Ht '5\' 5"'$  (1.651 m)   Wt 159 lb 3.2 oz (72.2 kg)   LMP 10/11/2019   SpO2 100%   BMI 26.49 kg/m     Physical Exam Vitals reviewed.  HENT:     Mouth/Throat:     Mouth: Mucous membranes are moist.  Cardiovascular:     Rate and Rhythm: Normal rate and regular rhythm.     Heart sounds: Normal heart sounds.  Pulmonary:     Effort: Pulmonary effort is normal.     Breath sounds: Normal  breath sounds.  Abdominal:     Palpations: Abdomen is soft.  Musculoskeletal:        General: Normal range of motion.  Skin:    General: Skin is warm.  Neurological:     Mental Status: She is alert and oriented to person, place, and time.  Psychiatric:        Behavior: Behavior normal.     Imaging: VAS [place] LOWER EXTREMITY VENOUS (DVT)  Result Date: 11/17/2022  Lower Venous DVT Study Patient Name:  Stacey Lucas  Date of Exam:   11/16/2022 Medical Rec #: [id number]     Accession #:    [id number] Date of Birth: 12-16-1975    Patient Gender: F Patient Age:   60 years Exam Location:  [facility] Procedure:      VAS [place] LOWER EXTREMITY VENOUS (DVT) Referring Phys: JULIE MACHEN --------------------------------------------------------------------------------  Indications: Swelling.  Risk Factors: None identified. Comparison Study: No prior studies. Performing Technologist: Oliver Hum RVT  Examination Guidelines: A complete evaluation includes B-mode imaging, spectral Doppler, color Doppler, and power Doppler as needed of all accessible portions of each vessel. Bilateral testing is considered an integral part of a complete examination. Limited examinations for reoccurring indications may be performed as noted. The reflux portion of the exam is performed with the patient in reverse Trendelenburg.   +-----+---------------+---------+-----------+----------+--------------+ RIGHTCompressibilityPhasicitySpontaneityPropertiesThrombus Aging +-----+---------------+---------+-----------+----------+--------------+ CFV  Full           Yes      Yes                                 +-----+---------------+---------+-----------+----------+--------------+   +---------+---------------+---------+-----------+----------+--------------+ LEFT     CompressibilityPhasicitySpontaneityPropertiesThrombus Aging +---------+---------------+---------+-----------+----------+--------------+ CFV      Full           Yes      Yes                                 +---------+---------------+---------+-----------+----------+--------------+ SFJ      Full                                                        +---------+---------------+---------+-----------+----------+--------------+ FV Prox  Full                                                        +---------+---------------+---------+-----------+----------+--------------+ FV Mid   Full                                                        +---------+---------------+---------+-----------+----------+--------------+ FV DistalFull                                                        +---------+---------------+---------+-----------+----------+--------------+ PFV  Full                                                        +---------+---------------+---------+-----------+----------+--------------+ POP      Full           Yes      Yes                                 +---------+---------------+---------+-----------+----------+--------------+ PTV      Full                                                        +---------+---------------+---------+-----------+----------+--------------+ PERO     Full                                                         +---------+---------------+---------+-----------+----------+--------------+    Summary: RIGHT: - No evidence of common femoral vein obstruction.  LEFT: - There is no evidence of deep vein thrombosis in the lower extremity.  - No cystic structure found in the popliteal fossa.  *See table(s) above for measurements and observations. Electronically signed by Jamelle Haring on 11/17/2022 at 4:28:17 PM.    Final    VAS US CAROTID  Result Date: 11/17/2022 Carotid Arterial Duplex Study Patient Name:  Stacey Lucas  Date of Exam:   11/15/2022 Medical Rec #: [id number]     Accession #:    [id number] Date of Birth: [birth year]    Patient Gender: F Patient Age:   41 years Exam Location:  [facility] Procedure:      VAS US CAROTID Referring Phys: Rosalin Hawking --------------------------------------------------------------------------------  Performing Technologist: Darlin Coco RDMS, RVT  Examination Guidelines: A complete evaluation includes B-mode imaging, spectral Doppler, color Doppler, and power Doppler as needed of all accessible portions of each vessel. Bilateral testing is considered an integral part of a complete examination. Limited examinations for reoccurring indications may be performed as noted.  Right Carotid Findings: +----------+--------+--------+--------+------------------+--------+           PSV cm/sEDV cm/sStenosisPlaque DescriptionComments +----------+--------+--------+--------+------------------+--------+ CCA Prox  68      14                                         +----------+--------+--------+--------+------------------+--------+ CCA Distal54      14              focal mild                 +----------+--------+--------+--------+------------------+--------+ ICA Prox  54      21                                         +----------+--------+--------+--------+------------------+--------+ ICA Mid   52  15                                          +----------+--------+--------+--------+------------------+--------+ ICA Distal29      9                                          +----------+--------+--------+--------+------------------+--------+ ECA       92      17                                         +----------+--------+--------+--------+------------------+--------+ +----------+--------+-------+--------+-------------------+           PSV cm/sEDV cmsDescribeArm Pressure (mmHG) +----------+--------+-------+--------+-------------------+ [id number]                                         +----------+--------+-------+--------+-------------------+ +---------+--------+--+--------+--+ VertebralPSV cm/s95EDV cm/s26 +---------+--------+--+--------+--+  Left Carotid Findings: +----------+--------+--------+--------+------------------+--------+           PSV cm/sEDV cm/sStenosisPlaque DescriptionComments +----------+--------+--------+--------+------------------+--------+ CCA Prox  91      16                                         +----------+--------+--------+--------+------------------+--------+ CCA Distal77      17                                         +----------+--------+--------+--------+------------------+--------+ ICA Prox  50      17                                         +----------+--------+--------+--------+------------------+--------+ ICA Mid   135     41                                tortuous +----------+--------+--------+--------+------------------+--------+ ICA Distal100     31                                         +----------+--------+--------+--------+------------------+--------+ ECA       76      14                                         +----------+--------+--------+--------+------------------+--------+ +----------+--------+--------+--------+-------------------+           PSV cm/sEDV cm/sDescribeArm Pressure (mmHG)  +----------+--------+--------+--------+-------------------+ [id number]                                         +----------+--------+--------+--------+-------------------+ +---------+--------+--+--------+--+ VertebralPSV cm/s70EDV cm/s21 +---------+--------+--+--------+--+   Summary: Right Carotid: The  extracranial vessels were near-normal with only minimal wall                thickening or plaque. Left Carotid: The extracranial vessels were near-normal with only minimal wall               thickening or plaque. Vertebrals:  Bilateral vertebral arteries demonstrate antegrade flow. Subclavians: Normal flow hemodynamics were seen in bilateral subclavian              arteries. *See table(s) above for measurements and observations.  Electronically signed by Antony Contras MD on 11/17/2022 at 10:11:47 AM.    Final    NM Pulmonary Perfusion  Result Date: 11/16/2022 CLINICAL DATA:  High probability pulmonary embolism. EXAM: NUCLEAR MEDICINE PERFUSION LUNG SCAN TECHNIQUE: Perfusion images were obtained in multiple projections after intravenous injection of radiopharmaceutical. Ventilation scans intentionally deferred if perfusion scan and chest x-ray adequate for interpretation during COVID 19 epidemic. RADIOPHARMACEUTICALS:  3.8 mCi Tc-48mMAA IV COMPARISON:  Chest x-ray 11/16/2022 FINDINGS: There is normal perfusion in all projections. No wedge shaped pleural-based defects are identified. IMPRESSION: Normal perfusion. Electronically Signed   By: ENolon NationsM.D.   On: 11/16/2022 14:10   DG CHEST PORT 1 VIEW  Result Date: 11/16/2022 CLINICAL DATA:  Shortness of breath. EXAM: PORTABLE CHEST 1 VIEW COMPARISON:  Radiograph 2 days ago 11/14/2022 FINDINGS: The cardiomediastinal contours are normal. Minimal right infrahilar atelectasis is new from prior exam. Pulmonary vasculature is normal. No consolidation, pleural effusion, or pneumothorax. No acute osseous abnormalities are seen. IMPRESSION: Minimal  right infrahilar atelectasis, new from prior exam. No other acute findings. Electronically Signed   By: MKeith RakeM.D.   On: 11/16/2022 12:37   ECHOCARDIOGRAM COMPLETE  Result Date: 11/15/2022    ECHOCARDIOGRAM REPORT   Patient Name:   Stacey HILLARDDate of Exam: 11/15/2022 Medical Rec #:  [id number]   Height:       65.0 in Accession #:    [id number]  Weight:       158.7 lb Date of Birth:  1March 23, 1977  BSA:          1.793 m Patient Age:    89years     BP:           143/79 mmHg Patient Gender: F            HR:           81 bpm. Exam Location:  Inpatient Procedure: 2D Echo, Cardiac Doppler and Color Doppler Indications:    stroke  History:        Patient has prior history of Echocardiogram examinations. Risk                 Factors:Hypertension, Diabetes and Dyslipidemia.  Sonographer:    LPhineas DouglasReferring Phys: [id number] 1. Left ventricular ejection fraction, by estimation, is 60 to 65%. The left ventricle has normal function. The left ventricle has no regional wall motion abnormalities. Left ventricular diastolic parameters are consistent with Grade I diastolic dysfunction (impaired relaxation).  2. Right ventricular systolic function is normal. The right ventricular size is normal. There is normal pulmonary artery systolic pressure. The estimated right ventricular systolic pressure is [id number].  3. Left atrial size was severely dilated.  4. The mitral valve is normal in structure. No evidence of mitral valve regurgitation. No evidence of mitral stenosis.  5. The aortic valve is tricuspid. Aortic valve regurgitation is not  visualized. No aortic stenosis is present.  6. The inferior vena cava is normal in size with greater than 50% respiratory variability, suggesting right atrial pressure of 3 mmHg. FINDINGS  Left Ventricle: Left ventricular ejection fraction, by estimation, is 60 to 65%. The left ventricle has normal function. The left ventricle has no regional wall motion  abnormalities. The left ventricular internal cavity size was normal in size. There is  no left ventricular hypertrophy. Left ventricular diastolic parameters are consistent with Grade I diastolic dysfunction (impaired relaxation). Right Ventricle: The right ventricular size is normal. No increase in right ventricular wall thickness. Right ventricular systolic function is normal. There is normal pulmonary artery systolic pressure. The tricuspid regurgitant velocity is 1.98 m/s, and  with an assumed right atrial pressure of 3 mmHg, the estimated right ventricular systolic pressure is [id number] mmHg. Left Atrium: Left atrial size was severely dilated. Right Atrium: Right atrial size was normal in size. Pericardium: Trivial pericardial effusion is present. Mitral Valve: The mitral valve is normal in structure. There is mild calcification of the mitral valve leaflet(s). Mild mitral annular calcification. No evidence of mitral valve regurgitation. No evidence of mitral valve stenosis. Tricuspid Valve: The tricuspid valve is normal in structure. Tricuspid valve regurgitation is trivial. Aortic Valve: The aortic valve is tricuspid. Aortic valve regurgitation is not visualized. No aortic stenosis is present. Pulmonic Valve: The pulmonic valve was normal in structure. Pulmonic valve regurgitation is not visualized. Aorta: The aortic root is normal in size and structure. Venous: The inferior vena cava is normal in size with greater than 50% respiratory variability, suggesting right atrial pressure of 3 mmHg. IAS/Shunts: No atrial level shunt detected by color flow Doppler.  LEFT VENTRICLE PLAX 2D LVIDd:         4.40 cm      Diastology LVIDs:         2.50 cm      LV e' medial:    7.62 cm/s LV PW:         1.30 cm      LV E/e' medial:  11.9 LV IVS:        1.00 cm      LV e' lateral:   9.79 cm/s LVOT diam:     2.00 cm      LV E/e' lateral: 9.3 LV SV:         78 LV SV Index:   43 LVOT Area:     3.14 cm  LV Volumes (MOD) LV vol d, MOD  A2C: 113.0 ml LV vol d, MOD A4C: 106.0 ml LV vol s, MOD A2C: 38.0 ml LV vol s, MOD A4C: 43.5 ml LV SV MOD A2C:     75.0 ml LV SV MOD A4C:     106.0 ml LV SV MOD BP:      72.4 ml RIGHT VENTRICLE             IVC RV Basal diam:  3.60 cm     IVC diam: 1.90 cm RV S prime:     12.10 cm/s TAPSE (M-mode): 2.4 cm LEFT ATRIUM              Index        RIGHT ATRIUM           Index LA diam:        3.60 cm  2.01 cm/m   RA Area:     14.90 cm LA Vol (A2C):   102.0 ml 56.89 ml/m  RA  Volume:   37.90 ml  21.14 ml/m LA Vol (A4C):   90.0 ml  50.19 ml/m LA Biplane Vol: 98.8 ml  55.10 ml/m  AORTIC VALVE LVOT Vmax:   104.00 cm/s LVOT Vmean:  67.700 cm/s LVOT VTI:    0.247 m  AORTA Ao Root diam: 2.90 cm Ao Asc diam:  3.00 cm MITRAL VALVE                TRICUSPID VALVE MV Area (PHT): 4.33 cm     TR Peak grad:   15.7 mmHg MV Decel Time: 175 msec     TR Vmax:        198.00 cm/s MV E velocity: 90.80 cm/s MV A velocity: 100.00 cm/s  SHUNTS MV E/A ratio:  0.91         Systemic VTI:  0.25 m                             Systemic Diam: 2.00 cm Dalton McleanMD Electronically signed by Franki Monte Signature Date/Time: 11/15/2022/2:39:16 PM    Final    MR ANGIO HEAD WO CONTRAST  Result Date: 11/15/2022 CLINICAL DATA:  Acute infarct in the left pons on recent MRI head EXAM: MRA HEAD WITHOUT CONTRAST TECHNIQUE: Angiographic images of the Circle of Willis were acquired using MRA technique without intravenous contrast. COMPARISON:  MRI head 11/14/2022, CTA head 12/29/2021 FINDINGS: Anterior circulation: Both internal carotid arteries are patent to the termini, without significant stenosis. A1 segments patent. Normal anterior communicating artery. Anterior cerebral arteries are patent to their distal aspects. No M1 stenosis or occlusion. Normal MCA bifurcations. Distal MCA branches perfused and symmetric. Posterior circulation: Vertebral arteries patent to the vertebrobasilar junction without stenosis. Posterior inferior cerebral arteries patent  bilaterally. Basilar patent to its distal aspect. Superior cerebellar arteries patent bilaterally. Patent P1 segments. PCAs perfused to their distal aspects without stenosis. The bilateral posterior communicating arteries are not visualized. Anatomic variants: None significant IMPRESSION: No intracranial large vessel occlusion or significant stenosis. Electronically Signed   By: Merilyn Baba M.D.   On: 11/15/2022 01:06   DG CHEST PORT 1 VIEW  Result Date: 11/14/2022 CLINICAL DATA:  Leukocytosis EXAM: PORTABLE CHEST 1 VIEW COMPARISON:  12/29/2021 FINDINGS: Single frontal view of the chest demonstrates an unremarkable cardiac silhouette. No airspace disease, effusion, or pneumothorax. No acute bony abnormalities. IMPRESSION: 1. No acute intrathoracic process. Electronically Signed   By: Randa Ngo M.D.   On: 11/14/2022 15:53   US RENAL  Result Date: 11/14/2022 CLINICAL DATA:  AKI EXAM: RENAL / URINARY TRACT ULTRASOUND COMPLETE COMPARISON:  CT abdomen/pelvis 10/27/2019 FINDINGS: Right Kidney: Renal measurements: 9.2 cm x 4.0 cm x 5.4 cm = volume: 105 mL. Parenchymal echogenicity is increased. No mass or hydronephrosis visualized. Left Kidney: Renal measurements: 9.5 cm x 6.6 cm x 6.0 cm = volume: 195 mL. Parenchymal echogenicity is increased. No mass or hydronephrosis visualized. Bladder: Appears normal for degree of bladder distention. Other: None. IMPRESSION: Increased cortical echogenicity bilaterally suggestive of medical renal disease. No hydronephrosis. Electronically Signed   By: Valetta Mole M.D.   On: 11/14/2022 15:13   MR BRAIN WO CONTRAST  Result Date: 11/14/2022 EXAM: MRI HEAD WITHOUT CONTRAST TECHNIQUE: Multiplanar, multiecho pulse sequences of the brain and surrounding structures were obtained without intravenous contrast. COMPARISON:  Same day CT head. FINDINGS: Brain: Small acute infarct in the left pons. No significant edema or mass effect. No evidence of acute hemorrhage, mass  lesion,  midline shift or hydrocephalus. Patchy white matter T2/FLAIR hyperintensities, nonspecific but compatible with chronic microvascular ischemic disease. Vascular: Major arterial flow voids are maintained at the skull base. Skull and upper cervical spine: Normal marrow signal. Sinuses/Orbits: Mild paranasal sinus mucosal thickening. Remote left medial orbital wall fracture. Other: No sizable mastoid effusions. IMPRESSION: Small acute infarct in the left pons. Electronically Signed   By: Margaretha Sheffield M.D.   On: 11/14/2022 11:40   CT HEAD CODE STROKE WO CONTRAST  Result Date: 11/14/2022 CLINICAL DATA:  Code stroke.  Neuro deficit, acute, stroke suspected EXAM: CT HEAD WITHOUT CONTRAST TECHNIQUE: Contiguous axial images were obtained from the base of the skull through the vertex without intravenous contrast. RADIATION DOSE REDUCTION: This exam was performed according to the departmental dose-optimization program which includes automated exposure control, adjustment of the mA and/or kV according to patient size and/or use of iterative reconstruction technique. COMPARISON:  None Available. FINDINGS: Brain: No evidence of acute large vascular territory infarction, hemorrhage, hydrocephalus, extra-axial collection or mass lesion/mass effect. Vascular: No hyperdense vessel identified. Skull: No acute fracture. Sinuses/Orbits: Remote left medial orbital wall fracture. No acute orbital findings. Other: No mastoid effusions. ASPECTS [facility] Stroke Program Early CT Score) total score (0-10 with 10 being normal): 10. IMPRESSION: 1. No evidence of acute large vascular territory infarct or acute hemorrhage. ASPECTS is 10. 2. Remote left basal ganglia perforator infarct. Code stroke imaging results were communicated on 11/14/2022 at 10:30 am to provider stack via secure text paging. Electronically Signed   By: Margaretha Sheffield M.D.   On: 11/14/2022 10:30    Labs:  CBC: Recent Labs    11/16/22 0352 11/17/22 0617  11/17/22 1548 11/18/22 0409 11/19/22 0241  WBC 8.6 10.6*  --  12.1* 10.5  HGB 7.5* 6.9* 8.5* 8.5* 7.9*  HCT 22.3* 20.8* 24.7* 25.8* 24.8*  PLT 171 204  --  218 212    COAGS: Recent Labs    12/29/21 1217 01/01/22 0413 11/14/22 1014  INR 1.0 1.0 1.0  APTT 34  --  40*    BMP: Recent Labs    11/16/22 0352 11/17/22 0617 11/18/22 0409 11/19/22 0241  NA 139 140 140 138  K 4.2 3.9 3.9 3.8  CL 111 111 110 110  CO2 19* 22 22 21*  GLUCOSE 190* 123* 124* 165*  BUN 44* 47* 44* 44*  CALCIUM 7.6* 7.6* 8.0* 7.7*  CREATININE 4.59* 4.34* 4.47* 4.34*  GFRNONAA 11* 12* 12* 12*    LIVER FUNCTION TESTS: Recent Labs    12/29/21 1217 01/03/22 0602 03/05/22 1045 11/14/22 1014 11/16/22 0352 11/17/22 0617 11/18/22 0409 11/19/22 0241  BILITOT 0.6 0.2* <0.2 0.4  --   --   --   --   AST 13* '28 25 24  '$ --   --   --   --   ALT '7 11 19 12  '$ --   --   --   --   ALKPHOS 54 51 121 69  --   --   --   --   PROT 4.2* 4.2* 5.3* 5.0*  --   --   --   --   ALBUMIN <1.5* <1.5* 2.9* <1.5* <1.5* <1.5* <1.5* <1.5*    TUMOR MARKERS: No results for input(s): "AFPTM", "CEA", "CA199", "CHROMGRNA" in the last 8760 hours.  Assessment and Plan:  Scheduled for random renal biopsy 11/20/22 in IR Nephrotic syndrome with AKI/CKD Hep will be turned off 3-4 hrs before procedure (IR will call) Risks  and benefits of random renal biopsy was discussed with the patient and/or patient's family including, but not limited to bleeding, infection, damage to adjacent structures or low yield requiring additional tests.  All of the questions were answered and there is agreement to proceed.  Consent signed and in chart.  Thank you for this interesting consult.  I greatly enjoyed meeting Stacey Lucas and look forward to participating in their care.  A copy of this report was sent to the requesting provider on this date.  Electronically Signed: Lavonia Drafts, PA-C 11/19/2022, 9:37 AM   I spent a total of 20 Minutes     in face to face in clinical consultation, greater than 50% of which was counseling/coordinating care for random renal biopsy

## 2022-11-19 NOTE — Progress Notes (Addendum)
Subjective:   Summary: Stacey Lucas is a 47 y.o. year old female currently admitted on the IMTS HD#5 for acute left pontine CVA.  Overnight Events: NAEON. Nursing reports that she urinated 3x overnight.    Patient says she feels fine. No chest pain. Denies N/V, dizziness or blurry vision. She asked about her diet changing, and the attending clarified the renal diet and its purpose in the setting of her kidney function.   Objective:  Vital signs in last 24 hours: Vitals:   11/18/22 2345 11/19/22 0248 11/19/22 0311 11/19/22 0727  BP: (!) 147/74  (!) 151/78 130/83  Pulse: 84  83 74  Resp: '17  16 14  '$ Temp: 98.5 F (36.9 C)  98.3 F (36.8 C) 98.6 F (37 C)  TempSrc: Oral  Oral Oral  SpO2: 100%  100% 100%  Weight:  72.2 kg    Height:       Supplemental O2: Room Air SpO2: 100 % O2 Flow Rate (L/min): 200 L/min   Physical Exam:  Constitutional: well-appearing sitting in bed, in no acute distress Cardiovascular: RRR, no murmurs, rubs or gallops Pulmonary/Chest: normal work of breathing on room air, lungs clear to auscultation bilaterally Abdominal: soft, non-tender, non-distended Skin: warm and dry Extremities: no lower extremity edema present Neuro: Alert and oriented x3, dysarthria is improving, her speech cadence is more fluent than previously observed  [employer]   11/14/22 1000 11/19/22 0248  Weight: 72 kg 72.2 kg     Intake/Output Summary (Last 24 hours) at 11/19/2022 0758 Last data filed at 11/18/2022 1746 Gross per 24 hour  Intake 896.98 ml  Output --  Net 896.98 ml   Net IO Since Admission: 2,684 mL [11/19/22 0758]  Pertinent Labs:    Latest Ref Rng & Units 11/19/2022    2:41 AM 11/18/2022    4:09 AM 11/17/2022    3:48 PM  CBC  WBC 4.0 - 10.5 K/uL 10.5  12.1    Hemoglobin 12.0 - 15.0 g/dL 7.9  8.5  8.5   Hematocrit 36.0 - 46.0 % 24.8  25.8  24.7   Platelets 150 - 400 K/uL 212  218         Latest Ref Rng & Units 11/19/2022    2:41  AM 11/18/2022    4:09 AM 11/17/2022    6:17 AM  CMP  Glucose 70 - 99 mg/dL 165  124  123   BUN 6 - 20 mg/dL 44  44  47   Creatinine 0.44 - 1.00 mg/dL 4.34  4.47  4.34   Sodium 135 - 145 mmol/L 138  140  140   Potassium 3.5 - 5.1 mmol/L 3.8  3.9  3.9   Chloride 98 - 111 mmol/L 110  110  111   CO2 22 - 32 mmol/L '21  22  22   '$ Calcium 8.9 - 10.3 mg/dL 7.7  8.0  7.6     I personally reviewed interval labs and pertinent results include: hyperglycemia, uremia, and microcytic anemia. WBC is down from 12.1 and previously persistent leukocytosis.   Assessment/Plan:   Active Problems:   Acute CVA (cerebrovascular accident) ([place])   Nephrotic syndrome   Cerebrovascular accident (CVA) ([place])   AKI (acute kidney injury) ([place])   Patient Summary: Stacey Lucas is a 47 y.o. with a pertinent PMH of prior CVAs, polysubstance use, T2DM, and HTN, who presented with slurred speech and  right sided weakness and admitted for acute left pontine CVA.    #Nephrotic Syndrome #AKI on CKD Her facial and LE edema have improved significantly on lasix. Nursing reports that she urinated 3x overnight. Multiple myeloma studies from 3/7 demonstrate a kappa light/lambda light ratio of 2.47 and Urine protein of >7g. Protein electrophoresis results show low albumin (<1.5) with a significant serum protein gap, B2 globulin of 0.6, and no M-spike. Low suspicion of multiple myeloma given these results. She is still being worked up for nephrotic syndrome and potential causes of her kidney dysfunction. She has a normal C3 and C4 panel. ANCA profile and ANA w/ reflex were ordered as a workup for membranous nephropathy.  - continue lasix 40 mg - ANCA profile and ANA w/ reflex pending  - IR renal biopsy 3/13 AM   #Microcytic Anemia - continue Aranesp 60 mcg every Saturday - transfuse if Hgb <7  #HTN Her hypertension was controlled per last reading of 130/81 this morning. Continue amlodipine and coreg.  - amlodipine 10 mg  daily - Coreg 3.125 mg BID - labatelol 10 mg PRN   #Acute Left Pontine CVA Etiology is hypercoagulable from nephrotic syndrome vs. Small vessel. Her symptoms are resolving well. PT notes states that she is transitioning to using a walker to ambulate, and no longer requires the bedside commode. Dysarthria is improved from admission. Her heparin studies dropped below therapeutic dose (>0.3) last night, pharmacy increased heparin appropriately.  - Crestor '40mg'$  - heparin dosed by pharmacy - polysubstance use cessation  #Chest Pain No intervention needed at this time due to patient reporting no pain, but will CTM and repeat EKG or trops if pain increases in quality or severity.    Diet: Renal IVF: None VTE: Heparin Code: Full PT/OT recs: [place] (they/them/theirs) Medical Student

## 2022-11-19 NOTE — Progress Notes (Signed)
ANTICOAGULATION CONSULT NOTE - Follow Up Consult  Pharmacy Consult for Heparin  Indication:  nephrotic syndrome, acute pontine CVA  Allergies  Allergen Reactions   Gabapentin     Weakness/impairs balance    Patient Measurements: Height: '5\' 5"'$  (165.1 cm) Weight: 72.2 kg (159 lb 3.2 oz) IBW/kg (Calculated) : 57 Heparin Dosing Weight: 72 kg  Vital Signs: Temp: 98.3 F (36.8 C) (03/12 1936) Temp Source: Oral (03/12 1936) BP: 130/69 (03/12 1936) Pulse Rate: 74 (03/12 1936)  Labs: Recent Labs    11/17/22 0617 11/17/22 1548 11/17/22 1743 11/18/22 0409 11/19/22 0241 11/19/22 1739  HGB 6.9* 8.5*  --  8.5* 7.9*  --   HCT 20.8* 24.7*  --  25.8* 24.8*  --   PLT 204  --   --  218 212  --   HEPARINUNFRC 0.19*  --    < > 0.42 0.29* 0.41  CREATININE 4.34*  --   --  4.47* 4.34*  --    < > = values in this interval not displayed.    Estimated Creatinine Clearance: 16 mL/min (A) (by C-G formula based on SCr of 4.34 mg/dL (H)).  Assessment: 47 yo female with PMH of prior CVAs, HTN, T2DM, now presenting with acute CVA and nephrotic syndrome. Patient was not on anticoagulation PTA. Patient was started on DAPT with aspirin and clopidogrel - neurology ok with discontinuing DAPT if starting anticoagulation.  Pharmacy consulted for heparin dosing for nephrotic syndrome and acute pontine CVA.  Heparin level is therapeutic (0.41) tonight on 1350 units/hr, after rate increase this morning when level was subtherapeutic.  Noted plan to hold heparin drip 3-4 hours prior to renal biopsy on 11/20/22.  Goal of Therapy:  Heparin level 0.3-0.5 units/ml Monitor platelets by anticoagulation protocol: Yes   Plan:  Continue heparin drip at 1350 units/hr Daily heparin level and CBC. Monitor for signs/symptoms of bleeding. Will follow up for holding heparin 3-4 hours prior to renal biopsy on 3/13, and then for timing for resuming post-procedure.  Arty Baumgartner, [place] 11/19/2022,7:56 PM

## 2022-11-19 NOTE — Progress Notes (Signed)
Occupational Therapy Treatment Patient Details Name: Stacey Lucas MRN: [id number] DOB: April 06, 1976 Today's Date: 11/19/2022   History of present illness 47 yo female presents to [facility] on 3[facility]7 with R weakness, slurred speech. MRI brain shows Small acute infarct in the left pons. PMH includes PMH significant for previous stroke in 2023 (L basal ganglia), anxiety/depression/suicidal ideation, diabetes, hyperlipidemia, hypertension, migraines, cocaine abuse.   OT comments  Patient agreeable to OT.  Completing 4 step ADL task, min cueing to recall 4th step. Reports fatigued today, "less energy than yesterday".  Completing ADLs with min guard assist overall.  Worked on R UE- provided 2 exercises: prayer stretch and squeeze ball with cueing to initiate thumb flexion during exercise.  Will follow acutely.    Recommendations for follow up therapy are one component of a multi-disciplinary discharge planning process, led by the attending physician.  Recommendations may be updated based on patient status, additional functional criteria and insurance authorization.    Follow Up Recommendations  Home health OT     Assistance Recommended at Discharge Frequent or constant Supervision/Assistance  Patient can return home with the following  A little help with walking and/or transfers;A little help with bathing/dressing/bathroom;Assistance with cooking/housework;Direct supervision/assist for medications management;Direct supervision/assist for financial management;Assist for transportation;Help with stairs or ramp for entrance   Equipment Recommendations  BSC/3in1    Recommendations for Other Services      Precautions / Restrictions Precautions Precautions: Fall Restrictions Weight Bearing Restrictions: No       Mobility Bed Mobility               General bed mobility comments: EOB upon entry and at exit    Transfers Overall transfer level: Needs assistance Equipment used: Rollator (4  wheels) Transfers: Sit to/from Stand Sit to Stand: Min guard                 Balance Overall balance assessment: Needs assistance Sitting-balance support: No upper extremity supported, Feet supported Sitting balance-Leahy Scale: Good     Standing balance support: Bilateral upper extremity supported, During functional activity Standing balance-Leahy Scale: Poor Standing balance comment: relies on BUE support dynamically                           ADL either performed or assessed with clinical judgement   ADL Overall ADL's : Needs assistance/impaired     Grooming: Standing;Min guard               Lower Body Dressing: Sit to/from stand;Min guard   Toilet Transfer: Min guard;Ambulation;Regular Toilet;Grab bars Toilet Transfer Details (indicate cue type and reason): relies on grabbars to stand Toileting- Water quality scientist and Hygiene: Min guard;Sitting/lateral lean       Functional mobility during ADLs: Engineer, manufacturing (4 wheels)      Extremity/Trunk Assessment              Vision       Perception     Praxis      Cognition Arousal/Alertness: Awake/alert Behavior During Therapy: WFL for tasks assessed/performed Overall Cognitive Status: Impaired/Different from baseline Area of Impairment: Problem solving, Memory                     Memory: Decreased short-term memory       Problem Solving: Slow processing, Requires verbal cues General Comments: patient completing 4 step ADL task in room, requiring min cueing to recall 4th step.  She demonstrates some difficulty  with problem solving, cueing to manage rollator brakes        Exercises Exercises: Other exercises Other Exercises Other Exercises: prayer stretch to R fingers/wrist Other Exercises: squeeze ball issued and educated on gross grasp and pinching with cueing to initate thumb flexion    Shoulder Instructions       General Comments      Pertinent Vitals/  Pain       Pain Assessment Pain Assessment: No/denies pain  Home Living                                          Prior Functioning/Environment              Frequency  Min 2X/week        Progress Toward Goals  OT Goals(current goals can now be found in the care plan section)  Progress towards OT goals: Progressing toward goals  Acute Rehab OT Goals Patient Stated Goal: home OT Goal Formulation: With patient Time For Goal Achievement: 11/29/22 Potential to Achieve Goals: Good  Plan Discharge plan remains appropriate;Frequency remains appropriate    Co-evaluation                 AM-PAC OT "6 Clicks" Daily Activity     Outcome Measure   Help from another person eating meals?: A Little Help from another person taking care of personal grooming?: A Little Help from another person toileting, which includes using toliet, bedpan, or urinal?: A Little Help from another person bathing (including washing, rinsing, drying)?: A Little Help from another person to put on and taking off regular upper body clothing?: A Little Help from another person to put on and taking off regular lower body clothing?: A Little 6 Click Score: 18    End of Session Equipment Utilized During Treatment: Gait belt;Rollator (4 wheels)  OT Visit Diagnosis: Other abnormalities of gait and mobility (R26.89);Muscle weakness (generalized) (M62.81);Hemiplegia and hemiparesis;Other symptoms and signs involving cognitive function Hemiplegia - Right/Left: Right Hemiplegia - dominant/non-dominant: Dominant Hemiplegia - caused by: Cerebral infarction   Activity Tolerance Patient tolerated treatment well   Patient Left in bed;with call bell/phone within reach;with bed alarm set   Nurse Communication Mobility status        Time: [id number] OT Time Calculation (min): 31 min  Charges: OT Treatments $Self Care/Home Management : 23-37 mins  Jolaine Artist, [place] Office [phone]   Delight Stare 11/19/2022, 12:08 PM

## 2022-11-19 NOTE — Progress Notes (Signed)
ANTICOAGULATION CONSULT NOTE - Follow Up Consult  Pharmacy Consult for Heparin by continuous infusion Indication:  Nephrotic syndrome, acute pontine CVA  Allergies  Allergen Reactions   Gabapentin     Weakness/impairs balance    Patient Measurements: Height: '5\' 5"'$  (165.1 cm) Weight: 72.2 kg (159 lb 3.2 oz) IBW/kg (Calculated) : 57 Heparin Dosing Weight: 71.5  Vital Signs: Temp: 98.6 F (37 C) (03/12 0727) Temp Source: Oral (03/12 0727) BP: 130/83 (03/12 0727) Pulse Rate: 74 (03/12 0727)  Labs: Recent Labs    11/16/22 1102 11/16/22 1255 11/16/22 2123 11/17/22 0617 11/17/22 1548 11/17/22 1743 11/18/22 0409 11/19/22 0241  HGB  --   --    < > 6.9* 8.5*  --  8.5* 7.9*  HCT  --   --    < > 20.8* 24.7*  --  25.8* 24.8*  PLT  --   --   --  204  --   --  218 212  HEPARINUNFRC  --   --    < > 0.19*  --  0.40 0.42 0.29*  CREATININE  --   --   --  4.34*  --   --  4.47* 4.34*  TROPONINIHS 14 13  --   --   --   --   --   --    < > = values in this interval not displayed.    Estimated Creatinine Clearance: 16 mL/min (A) (by C-G formula based on SCr of 4.34 mg/dL (H)).   Goal of Therapy:  Heparin level 0.3 - 0.5 units/mL Monitor platelets by anticoagulation protocol: Yes   Plan:  Continue continuous infusion unfractionated heparin with dosing increase by ~2 units/kg/hour from 1200 to 1350 units/hr. Check ~ 8 hour heparin level. Continue daily CBC and heparin level.Monitor for signs/symptoms of bleeding.  Pennie Banter, PharmD, CAP, CACP 11/19/2022,7:59 AM

## 2022-11-19 NOTE — Progress Notes (Signed)
[facility] Progress Note   Assessment/ Plan:   47 year old female with history of CVA, diabetes, and hypertension admitted for acute left pontine infarct, found to have severe AKI with proteinuria, hypoalbuminemia, and edema suggestive of nephrotic syndrome.   Nephrotic syndrome with AKI on CKD Concern for nephrotic syndrome given resulted labs thus far, severe hypoalbuminemia, edema. Also on differential is diabetic nephropathy with progression in setting of AKI from malignant hypertension.  New diagnostic labs resulted overnight: Kappa free light chain 118.6 ^ Lamda free light chain 48.1 ^ Kappa/lambda light chain ratio 2.47 ^  C3 complement 124 C4 complement 26  UPEP/UIFE/Light chains/TP -Total proten 7592 ^ -Free kappa light chains, urine 263.52 ^ -Free lambda light chains, urine 73.02 ^ -Unremarkable immunofixation with no evidence of monoclonal protein  Protein electrophoresis -Total protein 4.7 -Albumin ELP 1.8 -Alpha-1-globulin 0.3 -Alpha-2-globulin 1.3 ^ -Beta globulin 0.7  Several pending diagnostic labs:  Immunofixation ANCA profile  AM labs reviewed: Albumin <1.5  Cr 4.34 (4.47 03/11) GFR 12  Phosphorus 5.9 No data on UOP over last 24h.  Diagnostic labs returned thus far: HIV, HCV, HBV negative 24h urine with 6 g protein HbA1c have historically been elevated.  Plan: -Consider consult to hematology as outpatient pending results of IR biopsy if ongoing concern for plasma cell disorder in setting of the above results -Continue lasix, crestor, heparin -Plan for renal biopsy tomorrow with IR; will need to be NPO at midnight; plan to transition back to Eliquis once this is done -F/u remaining serology results   Acute left pontine infarct Several risk factors despite young age: poorly controlled diabetes, HTN, HLD, cocaine use disorder. Concern for hypercoagulable state in setting of nephrotic syndrome in addition to atrial fibrillation  increasing risk of embolic stroke. Anticoagulation pending further results from nephrotic syndrome work up and decision regarding renal biopsy. Is on heparin gtt at this time until renal biopsy is able to be completed, hopefully 03/13.  Anemia Hgb stable. Iron replete, low reticulocyte index. Suspect due to renal disease. -S/p ESA 11/16/22   Diabetes -Management per primary team.   Hypertension Stable. -Management per primary team.  Subjective:   No complaints this morning. She has less energy than yesterday but otherwise feels well.    Objective:   BP 130/83 (BP Location: Left Arm)   Pulse 74   Temp 98.6 F (37 C) (Oral)   Resp 14   Ht '5\' 5"'$  (1.651 m)   Wt 72.2 kg   LMP 10/11/2019   SpO2 100%   BMI 26.49 kg/m   Intake/Output Summary (Last 24 hours) at 11/19/2022 0836 Last data filed at 11/18/2022 1746 Gross per 24 hour  Intake 896.98 ml  Output --  Net 896.98 ml    Weight change:   Physical Exam: NAD, seated at side of bed. Negative for peripheral edema. Normal work of breathing on room air. Skin is warm and dry. Normal mood and affect.  Imaging: No results found.  Labs: BMET Recent Labs  Lab 11/14/22 1013 11/14/22 1014 11/15/22 0618 11/16/22 0352 11/17/22 0617 11/18/22 0409 11/19/22 0241  NA 138 138 139 139 140 140 138  K 4.0 3.9 3.4* 4.2 3.9 3.9 3.8  CL 109 109 110 111 111 110 110  CO2  --  17* 22 19* 22 22 21*  GLUCOSE 93 103* 144* 190* 123* 124* 165*  BUN 37* 35* 40* 44* 47* 44* 44*  CREATININE 4.90* 4.28* 4.36* 4.59* 4.34* 4.47* 4.34*  CALCIUM  --  8.1*  7.6* 7.6* 7.6* 8.0* 7.7*  PHOS  --   --   --  5.3* 5.1* 5.8* 5.9*    CBC Recent Labs  Lab 11/14/22 1014 11/15/22 0618 11/15/22 1138 11/16/22 0352 11/17/22 0617 11/17/22 1548 11/18/22 0409 11/19/22 0241  WBC 16.0* 9.8  --  8.6 10.6*  --  12.1* 10.5  NEUTROABS 9.7* 5.3  --  5.1  --   --   --   --   HGB 9.8* 7.7*   < > 7.5* 6.9* 8.5* 8.5* 7.9*  HCT 29.5* 22.5*   < > 22.3* 20.8*  24.7* 25.8* 24.8*  MCV 76.2* 74.0*  --  74.1* 75.4*  --  75.9* 77.0*  PLT 291 228  --  171 204  --  218 212   < > = values in this interval not displayed.     Medications:     amLODipine  10 mg Oral Daily   buPROPion  150 mg Oral Daily   carvedilol  3.125 mg Oral BID WC   citalopram  10 mg Oral Daily   darbepoetin (ARANESP) injection - NON-DIALYSIS  60 mcg Subcutaneous Q Sat-1800   furosemide  40 mg Oral Daily   insulin aspart  0-9 Units Subcutaneous TID WC   ketotifen  2 drop Both Eyes BID   rosuvastatin  40 mg Oral Daily    Farrel Gordon, DO Internal Medicine PGY-2 11/19/2022, 8:36 AM

## 2022-11-19 NOTE — Progress Notes (Signed)
Initial Nutrition Assessment  DOCUMENTATION CODES:   Not applicable  INTERVENTION:  Continue diet per MD recommendation - recommend liberalizing to 2g Na with 1.5L fluid restriction  Provided with diet education on kidney health  NUTRITION DIAGNOSIS:   Food and nutrition related knowledge deficit related to  (low sodium foods) as evidenced by  (no prior education).  GOAL:   Patient will meet greater than or equal to 90% of their needs  MONITOR:   PO intake, Labs  REASON FOR ASSESSMENT:   Consult Diet education (Renal diet)  ASSESSMENT:   Pt with hx of HTN, HLD, DM type 2, cocaine abuse, tobacco abuse, and previous CVAs presented to ED with right sided weakness and slurred speech. Found to have infarct of the left pons.  Pt resting in bed at the time of assessment. Attending entered room during education so unable to perform physical exam. However, did talk with pt about the importance of managing high blood pressure and reducing sodium in preserving kidney function.   Discussed label reading and a general healthful diet. Handouts left at bedside.    Average Meal Intake: 3/8-3/12: 86% intake x 7 recorded meals  Nutritionally Relevant Medications: Scheduled Meds:  furosemide  40 mg Oral Daily   insulin aspart  0-9 Units Subcutaneous TID WC   Labs Reviewed: BUN 44, creatinine 4.34 Phosphorus 5.9 CBG ranges from 118-154 mg/dL over the last 24 hours  NUTRITION - FOCUSED PHYSICAL EXAM: Defer to follow-up  Diet Order:   Diet Order             Diet NPO time specified Except for: Sips with Meds  Diet effective midnight           Diet renal with fluid restriction Fluid restriction: 1200 mL Fluid; Room service appropriate? Yes; Fluid consistency: Thin  Diet effective now                   EDUCATION NEEDS:   Education needs have been addressed  Skin:  Skin Assessment: Reviewed RN Assessment  Last BM:  3/11 - type 7  Height:   Ht Readings from Last 1  Encounters:  11/14/22 '5\' 5"'$  (1.651 m)    Weight:   Wt Readings from Last 1 Encounters:  11/19/22 72.2 kg    Ideal Body Weight:  56.8 kg  BMI:  Body mass index is 26.49 kg/m.  Estimated Nutritional Needs:   Kcal:  1600-1800 kcal/d  Protein:  80-95g/d  Fluid:  1.6-1.8L/d    Ranell Patrick, RD, LDN Clinical Dietitian RD pager # available in [facility]  After hours/weekend pager # available in [facility]

## 2022-11-19 NOTE — Progress Notes (Signed)
Mobility Specialist: Progress Note   11/19/22 1704  Mobility  Activity Ambulated with assistance in room  Level of Assistance Minimal assist, patient does 75% or more  Assistive Device Front wheel walker  Distance Ambulated (ft) 6 ft  Activity Response Tolerated well  Mobility Referral Yes  $Mobility charge 1 Mobility   Post-Mobility: 69 HR, 124/68 (86) BP, 100% SpO2  Pt received in the bed and agreeable to mobility. MinA with bed mobility as well as to stand. C/o dizziness upon sitting EOB, VSS. Pt agreeable to ambulation attempt. Distance limited secondary to continued dizziness. Pt assisted back to bed with call bell and phone at her side. Bed alarm is on. Pt's daughter present in the room.   [place] Stacey Lucas Mobility Specialist Please contact via SecureChat or Rehab office at [phone]

## 2022-11-19 NOTE — Progress Notes (Signed)
Provided with handout for educational information for renal diet.  Teaching at bedside with patient. Patient verbalized understanding.

## 2022-11-20 ENCOUNTER — Other Ambulatory Visit (HOSPITAL_COMMUNITY): Payer: Self-pay

## 2022-11-20 ENCOUNTER — Inpatient Hospital Stay (HOSPITAL_COMMUNITY): Payer: BC Managed Care – PPO

## 2022-11-20 DIAGNOSIS — N179 Acute kidney failure, unspecified: Secondary | ICD-10-CM | POA: Diagnosis not present

## 2022-11-20 DIAGNOSIS — N0429 Other nephrotic syndrome with diffuse membranous glomerulonephritis: Secondary | ICD-10-CM | POA: Diagnosis not present

## 2022-11-20 DIAGNOSIS — N189 Chronic kidney disease, unspecified: Secondary | ICD-10-CM | POA: Diagnosis not present

## 2022-11-20 LAB — CBC
HCT: 23.5 % — ABNORMAL LOW (ref 36.0–46.0)
Hemoglobin: 8 g/dL — ABNORMAL LOW (ref 12.0–15.0)
MCH: 25.9 pg — ABNORMAL LOW (ref 26.0–34.0)
MCHC: 34 g/dL (ref 30.0–36.0)
MCV: 76.1 fL — ABNORMAL LOW (ref 80.0–100.0)
Platelets: 202 10*3/uL (ref 150–400)
RBC: 3.09 MIL/uL — ABNORMAL LOW (ref 3.87–5.11)
RDW: 16.7 % — ABNORMAL HIGH (ref 11.5–15.5)
WBC: 10.2 10*3/uL (ref 4.0–10.5)
nRBC: 0 % (ref 0.0–0.2)

## 2022-11-20 LAB — RENAL FUNCTION PANEL
Albumin: <1.5 g/dL — ABNORMAL LOW (ref 3.5–5.0)
Anion gap: 6 (ref 5–15)
BUN: 42 mg/dL — ABNORMAL HIGH (ref 6–20)
CO2: 23 mmol/L (ref 22–32)
Calcium: 7.9 mg/dL — ABNORMAL LOW (ref 8.9–10.3)
Chloride: 110 mmol/L (ref 98–111)
Creatinine, Ser: 4.54 mg/dL — ABNORMAL HIGH (ref 0.44–1.00)
GFR, Estimated: 11 mL/min — ABNORMAL LOW (ref >60)
Glucose, Bld: 159 mg/dL — ABNORMAL HIGH (ref 70–99)
Phosphorus: 5.8 mg/dL — ABNORMAL HIGH (ref 2.5–4.6)
Potassium: 3.8 mmol/L (ref 3.5–5.1)
Sodium: 139 mmol/L (ref 135–145)

## 2022-11-20 LAB — HEPARIN LEVEL (UNFRACTIONATED): Heparin Unfractionated: 0.37 IU/mL (ref 0.30–0.70)

## 2022-11-20 LAB — GLUCOSE, CAPILLARY
Glucose-Capillary: 112 mg/dL — ABNORMAL HIGH (ref 70–99)
Glucose-Capillary: 109 mg/dL — ABNORMAL HIGH (ref 70–99)
Glucose-Capillary: 123 mg/dL — ABNORMAL HIGH (ref 70–99)
Glucose-Capillary: 141 mg/dL — ABNORMAL HIGH (ref 70–99)

## 2022-11-20 LAB — PROTIME-INR
INR: 0.9 (ref 0.8–1.2)
Prothrombin Time: 12.3 seconds (ref 11.4–15.2)

## 2022-11-20 LAB — IMMUNOFIXATION ELECTROPHORESIS
IgA: 192 mg/dL (ref 87–352)
IgG (Immunoglobin G), Serum: 596 mg/dL (ref 586–1602)
IgM (Immunoglobulin M), Srm: 74 mg/dL (ref 26–217)
Total Protein ELP: 4.5 g/dL — ABNORMAL LOW (ref 6.0–8.5)

## 2022-11-20 MED ORDER — MIDAZOLAM HCL 2 MG/2ML IJ SOLN
INTRAMUSCULAR | Status: AC
  Filled 2022-11-20: qty 2

## 2022-11-20 MED ORDER — MIDAZOLAM HCL 2 MG/2ML IJ SOLN
INTRAMUSCULAR | Status: AC | PRN
  Administered 2022-11-20: 1 mg via INTRAVENOUS

## 2022-11-20 MED ORDER — FENTANYL CITRATE (PF) 100 MCG/2ML IJ SOLN
INTRAMUSCULAR | Status: AC | PRN
  Administered 2022-11-20: 50 ug via INTRAVENOUS

## 2022-11-20 MED ORDER — FENTANYL CITRATE (PF) 100 MCG/2ML IJ SOLN
INTRAMUSCULAR | Status: AC
  Filled 2022-11-20: qty 2

## 2022-11-20 MED ORDER — LIDOCAINE-EPINEPHRINE 1 %-1:100000 IJ SOLN
8.0000 mL | Freq: Once | INTRAMUSCULAR | Status: AC
  Administered 2022-11-20: 8 mL via INTRADERMAL

## 2022-11-20 MED ORDER — PNEUMOCOCCAL 20-VAL CONJ VACC 0.5 ML IM SUSY
0.5000 mL | PREFILLED_SYRINGE | INTRAMUSCULAR | Status: AC
  Administered 2022-11-21: 0.5 mL via INTRAMUSCULAR
  Filled 2022-11-20: qty 0.5

## 2022-11-20 MED ORDER — HEPARIN (PORCINE) 25000 UT/250ML-% IV SOLN: 1350.0000 [IU]/h | INTRAVENOUS | Status: DC

## 2022-11-20 NOTE — Progress Notes (Signed)
[facility] Progress Note   Assessment/ Plan:   47 year old female with history of CVA, diabetes, and hypertension admitted for acute left pontine infarct, found to have severe AKI with proteinuria, hypoalbuminemia, and edema suggestive of nephrotic syndrome.   Nephrotic syndrome with AKI on CKD Concern for nephrotic syndrome versus diabetic nephropathy with progression in setting of AKI from malignant hypertension.  New diagnostic labs resulted overnight: ANCA: unremarkable  Pending diagnostic labs:  Immunofixation  AM labs reviewed: Albumin <1.5  BUN 42 Cr 4.54 (4.37 03/12) GFR 11 Phosphorus 5.8 UOP over last 24h 1.7L  Diagnostic labs returned thus far largely non-revealing.  Plan: -Renal biopsy w/ IR today -Continue lasix, crestor, heparin -F/u remaining serology results -Nephrology will sign off. Will arrange close outpatient follow-up.   Acute left pontine infarct Management per primary, neurology. Will be able to transition off of heparin gtt following renal biopsy today.  Anemia Hgb stable. Iron replete, low reticulocyte index. Suspect due to renal disease. -S/p ESA 11/16/22   Diabetes -Management per primary team.   Hypertension Stable. -Management per primary team.  Subjective:   No complaints this morning. She has just returned from IR renal biopsy.   Objective:   BP 138/80 (BP Location: Left Arm)   Pulse 69   Temp 97.8 F (36.6 C) (Oral)   Resp 14   Ht '5\' 5"'$  (1.651 m)   Wt 72.2 kg   LMP 10/11/2019   SpO2 100%   BMI 26.49 kg/m   Intake/Output Summary (Last 24 hours) at 11/20/2022 1155 Last data filed at 11/20/2022 0400 Gross per 24 hour  Intake 1251.99 ml  Output 1500 ml  Net -248.01 ml   Physical Exam: NAD, laying comfortably in bed. Negative for peripheral edema. Normal work of breathing on room air. Skin is warm and dry. Pleasant mood and affect.   Imaging: No results found.  Labs: BMET Recent Labs  Lab  11/14/22 1014 11/15/22 0618 11/16/22 0352 11/17/22 0617 11/18/22 0409 11/19/22 0241 11/20/22 0244  NA 138 139 139 140 140 138 139  K 3.9 3.4* 4.2 3.9 3.9 3.8 3.8  CL 109 110 111 111 110 110 110  CO2 17* 22 19* 22 22 21* 23  GLUCOSE 103* 144* 190* 123* 124* 165* 159*  BUN 35* 40* 44* 47* 44* 44* 42*  CREATININE 4.28* 4.36* 4.59* 4.34* 4.47* 4.34* 4.54*  CALCIUM 8.1* 7.6* 7.6* 7.6* 8.0* 7.7* 7.9*  PHOS  --   --  5.3* 5.1* 5.8* 5.9* 5.8*   CBC Recent Labs  Lab 11/14/22 1014 11/15/22 0618 11/15/22 1138 11/16/22 0352 11/17/22 0617 11/17/22 1548 11/18/22 0409 11/19/22 0241 11/20/22 0244  WBC 16.0* 9.8  --  8.6 10.6*  --  12.1* 10.5 10.2  NEUTROABS 9.7* 5.3  --  5.1  --   --   --   --   --   HGB 9.8* 7.7*   < > 7.5* 6.9* 8.5* 8.5* 7.9* 8.0*  HCT 29.5* 22.5*   < > 22.3* 20.8* 24.7* 25.8* 24.8* 23.5*  MCV 76.2* 74.0*  --  74.1* 75.4*  --  75.9* 77.0* 76.1*  PLT 291 228  --  171 204  --  218 212 202   < > = values in this interval not displayed.    Medications:     amLODipine  10 mg Oral Daily   atorvastatin  80 mg Oral Daily   buPROPion  150 mg Oral Daily   carvedilol  3.125 mg Oral BID WC  citalopram  10 mg Oral Daily   darbepoetin (ARANESP) injection - NON-DIALYSIS  60 mcg Subcutaneous Q Sat-1800   furosemide  40 mg Oral Daily   insulin aspart  0-9 Units Subcutaneous TID WC   ketotifen  2 drop Both Eyes BID   [START ON 11/21/2022] pneumococcal 20-valent conjugate vaccine  0.5 mL Intramuscular Tomorrow-1000    Farrel Gordon, DO Internal Medicine PGY-2 11/20/2022, 11:55 AM

## 2022-11-20 NOTE — Progress Notes (Signed)
SLP Cancellation Note  Patient Details Name: Stacey Lucas MRN: [id number] DOB: 02/04/1976   Cancelled treatment:        Attempted to see pt for speech and cognitive-linguistic therapy.  Pt off floor for procedure at time of attempt.  SLP will return as schedule permits   Celedonio Savage, [place], [place] Office: [phone] 11/20/2022, 11:14 AM

## 2022-11-20 NOTE — Procedures (Signed)
Pre Procedure Dx: Proteinuria of uncertain etiology Post Procedural Dx: Same  Technically successful US guided biopsy of inferior pole of the left kidney.  EBL: Trace No immediate complications.   Ronny Bacon, MD Pager #: [phone]

## 2022-11-20 NOTE — Progress Notes (Signed)
Subjective:   Summary: Stacey Lucas is a 47 y.o. year old female currently admitted on the IMTS HD#5 for acute left pontine CVA. Patient reports she is doing well, having some L back pain after biopsy.  Objective:  Vital signs in last 24 hours: Vitals:   11/19/22 2000 11/20/22 0725 11/20/22 1002 11/20/22 1043  BP: 135/67 130/70 (!) 144/80 (!) 181/88  Pulse: 70 73 73 76  Resp: '18 18 18 13  '$ Temp: 98.4 F (36.9 C) 98.2 F (36.8 C)    TempSrc: Oral Oral    SpO2: 100% 100% 100% 100%  Weight:      Height:       Supplemental O2: Room Air SpO2: 100 % O2 Flow Rate (L/min): 200 L/min   Physical Exam:  Grossly unchanged from prior days Constitutional: well-appearing sitting in bed, in no acute distress Cardiovascular: RRR, no murmurs, rubs or gallops Pulmonary/Chest: normal work of breathing on room air, lungs clear to auscultation bilaterally Abdominal: soft, non-tender, non-distended Skin: warm and dry Extremities: no lower extremity edema present Neuro: Alert and oriented x3  Filed Weights   11/14/22 1000 11/19/22 0248  Weight: 72 kg 72.2 kg     Intake/Output Summary (Last 24 hours) at 11/20/2022 1047 Last data filed at 11/20/2022 0400 Gross per 24 hour  Intake 1251.99 ml  Output 1700 ml  Net -448.01 ml    Net IO Since Admission: 2,475.99 mL [11/20/22 1047]  Pertinent Labs:    Latest Ref Rng & Units 11/20/2022    2:44 AM 11/19/2022    2:41 AM 11/18/2022    4:09 AM  CBC  WBC 4.0 - 10.5 K/uL 10.2  10.5  12.1   Hemoglobin 12.0 - 15.0 g/dL 8.0  7.9  8.5   Hematocrit 36.0 - 46.0 % 23.5  24.8  25.8   Platelets 150 - 400 K/uL 202  212  218        Latest Ref Rng & Units 11/20/2022    2:44 AM 11/19/2022    2:41 AM 11/18/2022    4:09 AM  CMP  Glucose 70 - 99 mg/dL 159  165  124   BUN 6 - 20 mg/dL 42  44  44   Creatinine 0.44 - 1.00 mg/dL 4.54  4.34  4.47   Sodium 135 - 145 mmol/L 139  138  140   Potassium 3.5 - 5.1 mmol/L 3.8  3.8  3.9   Chloride  98 - 111 mmol/L 110  110  110   CO2 22 - 32 mmol/L '23  21  22   '$ Calcium 8.9 - 10.3 mg/dL 7.9  7.7  8.0     I personally reviewed interval labs and pertinent results include: hyperglycemia, uremia, and microcytic anemia. WBC is down from 12.1 and previously persistent leukocytosis.   Assessment/Plan:   Active Problems:   Acute CVA (cerebrovascular accident) ([place])   Nephrotic syndrome   Cerebrovascular accident (CVA) ([place])   AKI (acute kidney injury) ([place])   Patient Summary: Stacey Lucas is a 47 y.o. with a pertinent PMH of prior CVAs, polysubstance use, T2DM, and HTN, who presented with slurred speech and right sided weakness and admitted for acute left pontine CVA.    #Nephrotic Syndrome #AKI on CKD Her facial and LE edema have improved significantly on lasix. Multiple myeloma panel, ANA and ANCA panel all negative. She is still being worked up for nephrotic syndrome  is s/p renal biopsy today. Nephrology to f/u pathology. Etiology may be diabetic nephropathy. Cr stable at 4.54, 468m urine output documented. Patient most likely to discharge tomorrow with nephrology outpatient follow up. Will monitor overnight given renal biopsy. -renal function panel AM -CBC AM -heparin level AM - Continue lasix 40 mg -nephrology following  #Microcytic Anemia - continue Aranesp 60 mcg every Saturday - transfuse if Hgb <7  #HTN Her hypertension continues to be well controlled. Continue amlodipine and coreg.  - amlodipine 10 mg daily - Coreg 3.125 mg BID - labatelol 10 mg PRN   # Left Pontine CVA Etiology is hypercoagulable from nephrotic syndrome vs. Small vessel. Her symptoms are resolving well. HH PT/OT. Heparin ggt for now, will transition to DClydeat discharge. - Crestor '40mg'$  - heparin dosed by pharmacy, [place] - polysubstance use cessation -HH PT/OT  Diet: Renal IVF: None VTE: Heparin Code: Full PT/OT recs: Home Health  TIona Coach MD Internal Medicine Resident,  PGY-1 [facility]  Pager: #[phone]

## 2022-11-20 NOTE — Progress Notes (Signed)
Pharmacist rounding with the Internal Medicine Teaching Service/B1-[place] Team. I was asked to address the role of different anticoagulants in this patient with nephrotic syndrome and acute left pontine CVA.  Hypercoagulability is a well-known nephrotic syndrome complication, conferring increased venous and arterial thrombotic risk.   A consistently identified risk factor for the development of venous thromboembolic events is the degree of hypoalbuminemia. The patient's serum albumin level currently is reported as <1.5 grams per deciliter. Hypoalbuminemia affects the pharmacokinetics of warfarin; patients with hypoalbuminemia treated with warfarin are at increased risk of supratherapeutic anticoagulation and major bleeding. Frequent blood monitoring may be required to minimize this risk, but this is cumbersome for patients and clinicians, prompting a search for safer and more convenient anticoagulants.  In the context of nephrotic syndrome,  In a review of  27 patients treated with apixaban to prevent thromboembolic events, the following observations were made: The levels of apixaban minimal blood concentration (trough level; Cmin) and maximum blood concentration (Cmax) were measured. The mean duration of the anticoagulant treatment was 153 days (132). Patients were followed for a mean of 14.7 months (8.4) since the introduction of apixaban. Three patients had a TE at the time of nephrotic syndrome diagnosis. Two patients had pulmonary embolism (PE), and one patient presented a stroke in a lupus membranous nephropathy context. One patient developed PE approximately two months after the introduction of apixaban treatment. No minor or major bleeding events were noticed. The present study shows that patients suffering from severe nephrotic syndrome anticoagulated with apixaban had a reduced risk of venous and arterial thromboembolic events compared with patients previously described in the literature, without  an increased risk of bleeding.  In a second citation, twenty-one patients with nephrotic syndrome were included in the study. Nineteen patients were prescribed DOAC as thromboprophylaxis, and two patients received DOAC due to previous thromboembolic events, which were considered associated with the nephrotic syndrome. The type of DOAC prescribed was apixaban (n?=?10) and rivaroxaban (n?=?11). No patients experienced TE during DOAC treatment, while five patients had a minor bleeding episode. Patients who experienced bleeding episodes were older (median 62 vs. 51 years), more often female (80%), and had been on DOAC for a longer period (204 days vs 47 days).   Given these findings in the medical literature, a shared decision-making discussion concerning the potential for thromboembolic events, bleeding risks, and the required frequent monitoring of warfarin (versus a DOAC) would seemingly support the clinical decision to use a DOAC--two of the citations used apixaban. For patients with CrCl, < 15 ml/min, dose adjustment for DOACs according to chronic kidney disease severity (published in Clin Cardiol. 2019 Aug; 42(8): [id number]) indicates an apixaban dose of 2.5 mg twice daily. For patients with CrCl and ESRD on dialysis, an apixaban dose of 5 mg twice daily is cited.

## 2022-11-20 NOTE — Progress Notes (Addendum)
Physical Therapy Treatment Patient Details Name: Stacey Lucas MRN: [id number] DOB: Jun 30, 1976 Today's Date: 11/20/2022   History of Present Illness 47 yo female presents to [facility] on 3/7 with R weakness, slurred speech. MRI brain shows Small acute infarct in the left pons. PMH includes PMH significant for previous stroke in 2023 (L basal ganglia), anxiety/depression/suicidal ideation, diabetes, hyperlipidemia, hypertension, migraines, cocaine abuse.    PT Comments    Session focused on gait training with use of cane, as this is one of the patient's goals. Pt requiring up to min assist with single UE support, with increased lateral lean and decreased gait speed. Continues to ambulate at a supervision level with RW, so recommendation remains for RW vs Rollator for gait currently, however, pt would continue to benefit from balance challenge with therapy services. Pt reports fatigue today; NPO for upcoming renal biopsy.    Recommendations for follow up therapy are one component of a multi-disciplinary discharge planning process, led by the attending physician.  Recommendations may be updated based on patient status, additional functional criteria and insurance authorization.  Follow Up Recommendations  Home health PT     Assistance Recommended at Discharge PRN  Patient can return home with the following A little help with walking and/or transfers;A little help with bathing/dressing/bathroom   Equipment Recommendations  Other (comment) (rollator needs a new part, vs needs a new rollator)    Recommendations for Other Services       Precautions / Restrictions Precautions Precautions: Fall Restrictions Weight Bearing Restrictions: No     Mobility  Bed Mobility Overal bed mobility: Independent                  Transfers Overall transfer level: Needs assistance Equipment used: Straight cane Transfers: Sit to/from Stand Sit to Stand: Min assist           General transfer  comment: MinA to rise with cane from edge of bed. Increased time    Ambulation/Gait Ambulation/Gait assistance: Min assist, Supervision Gait Distance (Feet): 70 Feet (35", 35") Assistive device: Rolling walker (2 wheels), Straight cane Gait Pattern/deviations: Decreased dorsiflexion - right, Trunk flexed, Knee hyperextension - right, Step-through pattern, Decreased step length - right, Decreased dorsiflexion - left Gait velocity: decreased     General Gait Details: Trialed cane with instruction for use and keeping ~4 inches in front. Pt with modest instability, requiring assist for balance and increased lateral lean. improvement with use of RW, progressing to supervision   Stairs             Wheelchair Mobility    Modified Rankin (Stroke Patients Only) Modified Rankin (Stroke Patients Only) Pre-Morbid Rankin Score: Slight disability Modified Rankin: Moderately severe disability     Balance Overall balance assessment: Needs assistance Sitting-balance support: No upper extremity supported, Feet supported Sitting balance-Leahy Scale: Good     Standing balance support: During functional activity, Single extremity supported Standing balance-Leahy Scale: Poor Standing balance comment: reliant on at least single UE support statically                            Cognition Arousal/Alertness: Awake/alert Behavior During Therapy: WFL for tasks assessed/performed Overall Cognitive Status: Impaired/Different from baseline Area of Impairment: Problem solving, Memory                     Memory: Decreased short-term memory       Problem Solving: Requires verbal cues  Exercises      General Comments        Pertinent Vitals/Pain Pain Assessment Pain Assessment: No/denies pain    Home Living                          Prior Function            PT Goals (current goals can now be found in the care plan section) Acute Rehab PT  Goals Potential to Achieve Goals: Good Progress towards PT goals: Progressing toward goals    Frequency    Min 4X/week      PT Plan Current plan remains appropriate    Co-evaluation              AM-PAC PT "6 Clicks" Mobility   Outcome Measure  Help needed turning from your back to your side while in a flat bed without using bedrails?: None Help needed moving from lying on your back to sitting on the side of a flat bed without using bedrails?: None Help needed moving to and from a bed to a chair (including a wheelchair)?: A Little Help needed standing up from a chair using your arms (e.g., wheelchair or bedside chair)?: A Little Help needed to walk in hospital room?: A Little Help needed climbing 3-5 steps with a railing? : A Little 6 Click Score: 20    End of Session Equipment Utilized During Treatment: Gait belt Activity Tolerance: Patient tolerated treatment well Patient left: in bed;with call bell/phone within reach;with bed alarm set Nurse Communication: Mobility status PT Visit Diagnosis: Other abnormalities of gait and mobility (R26.89);Muscle weakness (generalized) (M62.81)     Time: [id number] PT Time Calculation (min) (ACUTE ONLY): 16 min  Charges:  $Gait Training: 8-22 mins                     Wyona Almas, PT, [name] Acute Rehabilitation Services Office [phone]    Deno Etienne 11/20/2022, 8:57 AM

## 2022-11-20 NOTE — Progress Notes (Signed)
ANTICOAGULATION CONSULT NOTE - Follow Up Consult  Pharmacy Consult for continuous infusion of unfractionated heparin with no bolus. To begin at 2300 hours per request of attending physician of the IMTS/B1-Service.  Indication: stroke and nephrotic syndrome.  Allergies  Allergen Reactions   Gabapentin     Weakness/impairs balance    Patient Measurements: Height: '5\' 5"'$  (165.1 cm) Weight: 72.2 kg (159 lb 3.2 oz) IBW/kg (Calculated) : 57 Heparin Dosing Weight: 71.5  Vital Signs: Temp: 97.8 F (36.6 C) (03/13 1148) Temp Source: Oral (03/13 1148) BP: 138/80 (03/13 1148) Pulse Rate: 69 (03/13 1148)  Labs: Recent Labs    11/18/22 0409 11/19/22 0241 11/19/22 1739 11/20/22 0244  HGB 8.5* 7.9*  --  8.0*  HCT 25.8* 24.8*  --  23.5*  PLT 218 212  --  202  LABPROT  --   --   --  12.3  INR  --   --   --  0.9  HEPARINUNFRC 0.42 0.29* 0.41 0.37  CREATININE 4.47* 4.34*  --  4.54*    Estimated Creatinine Clearance: 15.3 mL/min (A) (by C-G formula based on SCr of 4.54 mg/dL (H)).    Goal of Therapy:  Anti-Xa unfractionated heparin level range 0.3 - 0.5 units/mL Monitor platelets by anticoagulation protocol: Yes   Plan:  Start heparin infusion at 1350 units/hr. Will evaluate tomorrow for switching from [place] to a DOAC based upon shared decision making discussion with the patient and the team.  Pennie Banter, PharmD, CACP, CPP 11/20/2022,3:00 PM

## 2022-11-20 NOTE — Progress Notes (Signed)
Subjective:   Summary: Stacey Lucas is a 47 y.o. year old female currently admitted on the IMTS HD#6 for acute left pontine CVA.  Overnight Events: NAEON. PT session mentioned some dizziness while attempting to ambulate.   She was feeling a bit nervous for her renal biopsy procedure today. Med student supported at bedside during procedure. She was very sleepy afterwards and had back soreness. Per IR she will be on bedrest for 3 hours (til 2PM), and then can resume renal diet.   Objective:  Vital signs in last 24 hours: Vitals:   11/19/22 1507 11/19/22 1936 11/19/22 2000 11/20/22 0725  BP: 125/68 130/69 135/67 130/70  Pulse: 74 74 70 73  Resp: '16 17 18 18  '$ Temp: 98.4 F (36.9 C) 98.3 F (36.8 C) 98.4 F (36.9 C) 98.2 F (36.8 C)  TempSrc: Oral Oral Oral Oral  SpO2: 100% 100% 100% 100%  Weight:      Height:       Supplemental O2: Room Air SpO2: 100 % O2 Flow Rate (L/min): 200 L/min   Physical Exam:  Constitutional: in no acute distress Cardiovascular: RRR, no murmurs, rubs or gallops Pulmonary/Chest: normal work of breathing on room air, lungs clear to auscultation bilaterally Abdominal: soft, non-tender, non-distended Skin: warm and dry Extremities: upper/lower extremity pulses 2+, no lower extremity edema present  Filed Weights   11/14/22 1000 11/19/22 0248  Weight: 72 kg 72.2 kg     Intake/Output Summary (Last 24 hours) at 11/20/2022 0814 Last data filed at 11/20/2022 0400 Gross per 24 hour  Intake 1491.99 ml  Output 1700 ml  Net -208.01 ml   Net IO Since Admission: 2,475.99 mL [11/20/22 0814]  Pertinent Labs:    Latest Ref Rng & Units 11/20/2022    2:44 AM 11/19/2022    2:41 AM 11/18/2022    4:09 AM  CBC  WBC 4.0 - 10.5 K/uL 10.2  10.5  12.1   Hemoglobin 12.0 - 15.0 g/dL 8.0  7.9  8.5   Hematocrit 36.0 - 46.0 % 23.5  24.8  25.8   Platelets 150 - 400 K/uL 202  212  218        Latest Ref Rng & Units 11/20/2022    2:44 AM 11/19/2022     2:41 AM 11/18/2022    4:09 AM  CMP  Glucose 70 - 99 mg/dL 159  165  124   BUN 6 - 20 mg/dL 42  44  44   Creatinine 0.44 - 1.00 mg/dL 4.54  4.34  4.47   Sodium 135 - 145 mmol/L 139  138  140   Potassium 3.5 - 5.1 mmol/L 3.8  3.8  3.9   Chloride 98 - 111 mmol/L 110  110  110   CO2 22 - 32 mmol/L '23  21  22   '$ Calcium 8.9 - 10.3 mg/dL 7.9  7.7  8.0     I personally reviewed interval labs and pertinent results include: hyperglycemia, uremia, anemia.   Assessment/Plan:   Active Problems:   Acute CVA (cerebrovascular accident) ([place])   Nephrotic syndrome   Cerebrovascular accident (CVA) ([place])   AKI (acute kidney injury) ([place])   Patient Summary: Stacey Lucas is a 47 y.o. with a pertinent PMH of prior CVAs, polysubstance use, HTN, T2DM, who presented with slurred speech and right sided weakness and admitted for acute left pontine CVA and nephrotic syndrome.    #  Nephrotic Syndrome #AKI on CKD She is peeing well with net -281m on Lasix. Does not appear volume overloaded. Her ANA and ANCA results were negative, making membranous nephropathy less likely. She had a renal biopsy by IR today which can be managed in outpatient setting. - f/u kidney biopsy  - resume renal diet at 2 PM - resume heparin - monitor for post-op complications  #Acute Left Pontine CVA - holding heparin 3-4 hours prior to biopsy, resume post-biopsy  - appreciate PT recs, transition to home health on discharge - appreciate speech language pathology recs - counsel on polysubstance use cessation  #Chest Pain Resolved. She reported no chest pain yesterday or today.  - continue telemetry to monitor for A fib, 30 day cardiac monitor outpatient  #Microcytic Anemia - Aranesp 60 mcg s/p 11/16/22 - repeat CBCs daily - transfuse if hemoglobin <7  #T2DM She's mildly hyperglycemic at 159. - SSI  #HTN - amlodipine '10mg'$  daily  - coreg 3.125 mg BID  #Hyperlipidemia - Crestor '40mg'$   #Asymptomatic Pyuria - no  treatment indicated due to no symptoms.   #Depression - continue celexa and wellbutrin   Diet: Renal IVF: None VTE: None, til after biopsy Code: Full PT/OT recs: [place]

## 2022-11-20 NOTE — Progress Notes (Signed)
Mobility Specialist: Progress Note   11/20/22 1541  Mobility  Activity Refused mobility   Pt refused mobility with c/o back pain and general fatigue stating she didn't feel like she could walk right now. Will f/u as able.   [place] Verneda [place] Mobility Specialist Please contact via SecureChat or Rehab office at [phone]

## 2022-11-21 ENCOUNTER — Other Ambulatory Visit (HOSPITAL_COMMUNITY): Payer: Self-pay

## 2022-11-21 DIAGNOSIS — I639 Cerebral infarction, unspecified: Secondary | ICD-10-CM | POA: Diagnosis not present

## 2022-11-21 DIAGNOSIS — F17210 Nicotine dependence, cigarettes, uncomplicated: Secondary | ICD-10-CM | POA: Diagnosis not present

## 2022-11-21 LAB — RENAL FUNCTION PANEL
Albumin: <1.5 g/dL — ABNORMAL LOW (ref 3.5–5.0)
Anion gap: 6 (ref 5–15)
BUN: 44 mg/dL — ABNORMAL HIGH (ref 6–20)
CO2: 23 mmol/L (ref 22–32)
Calcium: 7.9 mg/dL — ABNORMAL LOW (ref 8.9–10.3)
Chloride: 113 mmol/L — ABNORMAL HIGH (ref 98–111)
Creatinine, Ser: 4.99 mg/dL — ABNORMAL HIGH (ref 0.44–1.00)
GFR, Estimated: 10 mL/min — ABNORMAL LOW (ref >60)
Glucose, Bld: 99 mg/dL (ref 70–99)
Phosphorus: 6.2 mg/dL — ABNORMAL HIGH (ref 2.5–4.6)
Potassium: 4.2 mmol/L (ref 3.5–5.1)
Sodium: 142 mmol/L (ref 135–145)

## 2022-11-21 LAB — CBC
HCT: 24.9 % — ABNORMAL LOW (ref 36.0–46.0)
Hemoglobin: 8 g/dL — ABNORMAL LOW (ref 12.0–15.0)
MCH: 25.1 pg — ABNORMAL LOW (ref 26.0–34.0)
MCHC: 32.1 g/dL (ref 30.0–36.0)
MCV: 78.1 fL — ABNORMAL LOW (ref 80.0–100.0)
Platelets: 242 10*3/uL (ref 150–400)
RBC: 3.19 MIL/uL — ABNORMAL LOW (ref 3.87–5.11)
RDW: 16.7 % — ABNORMAL HIGH (ref 11.5–15.5)
WBC: 10.2 10*3/uL (ref 4.0–10.5)
nRBC: 0 % (ref 0.0–0.2)

## 2022-11-21 LAB — HEPARIN LEVEL (UNFRACTIONATED): Heparin Unfractionated: 0.3 IU/mL (ref 0.30–0.70)

## 2022-11-21 LAB — GLUCOSE, CAPILLARY
Glucose-Capillary: 109 mg/dL — ABNORMAL HIGH (ref 70–99)
Glucose-Capillary: 140 mg/dL — ABNORMAL HIGH (ref 70–99)

## 2022-11-21 MED ORDER — APIXABAN 2.5 MG PO TABS
2.5000 mg | ORAL_TABLET | Freq: Two times a day (BID) | ORAL | 0 refills | Status: DC
  Filled 2022-11-21 (×2): qty 60, 30d supply, fill #0

## 2022-11-21 MED ORDER — BUPROPION HCL ER (XL) 150 MG PO TB24
150.0000 mg | ORAL_TABLET | Freq: Every day | ORAL | 0 refills | Status: DC
  Filled 2022-11-21: qty 30, 30d supply, fill #0

## 2022-11-21 MED ORDER — FUROSEMIDE 40 MG PO TABS
40.0000 mg | ORAL_TABLET | Freq: Every day | ORAL | 0 refills | Status: DC
  Filled 2022-11-21: qty 30, 30d supply, fill #0

## 2022-11-21 MED ORDER — CARVEDILOL 3.125 MG PO TABS
3.1250 mg | ORAL_TABLET | Freq: Two times a day (BID) | ORAL | 0 refills | Status: DC
  Filled 2022-11-21: qty 60, 30d supply, fill #0

## 2022-11-21 MED ORDER — ATORVASTATIN CALCIUM 80 MG PO TABS
80.0000 mg | ORAL_TABLET | Freq: Every day | ORAL | 0 refills | Status: DC
  Filled 2022-11-21: qty 30, 30d supply, fill #0

## 2022-11-21 MED ORDER — DARBEPOETIN ALFA 60 MCG/0.3ML IJ SOSY: 60.0000 ug | PREFILLED_SYRINGE | INTRAMUSCULAR | 0 refills | Status: DC

## 2022-11-21 MED ORDER — PNEUMOCOCCAL 20-VAL CONJ VACC 0.5 ML IM SUSY
0.5000 mL | PREFILLED_SYRINGE | INTRAMUSCULAR | 0 refills | Status: AC
  Filled 2022-11-21: qty 0.5, 1d supply, fill #0

## 2022-11-21 MED ORDER — APIXABAN 2.5 MG PO TABS
2.5000 mg | ORAL_TABLET | Freq: Two times a day (BID) | ORAL | 0 refills | Status: DC
  Filled 2022-11-21: qty 60, 30d supply, fill #0

## 2022-11-21 MED ORDER — APIXABAN 2.5 MG PO TABS: 2.5000 mg | ORAL_TABLET | Freq: Two times a day (BID) | ORAL | Status: DC

## 2022-11-21 MED ORDER — APIXABAN 2.5 MG PO TABS
2.5000 mg | ORAL_TABLET | Freq: Two times a day (BID) | ORAL | Status: DC
  Administered 2022-11-21: 2.5 mg via ORAL
  Filled 2022-11-21: qty 1

## 2022-11-21 NOTE — Discharge Summary (Signed)
Name: Stacey Lucas MRN: [id number] DOB: [birth year] 47 y.o. PCP: Elsie Stain, MD  Date of Admission: 11/14/2022 10:01 AM Date of Discharge: 11/21/2022 Attending Physician: Velna Ochs, MD  Discharge Diagnosis: 1. Active Problems:   Acute CVA (cerebrovascular accident) ([place])   Nephrotic syndrome   Cerebrovascular accident (CVA) ([place])   AKI (acute kidney injury) ([place]) T2DM HTN Hyperlipidemia Asymptomatic pyuria Anemia of chronic disease  Discharge Medications: Allergies as of 11/21/2022       Reactions   Gabapentin    Weakness/impairs balance        Medication List     STOP taking these medications    Accu-Chek Guide test strip Generic drug: glucose blood   aspirin EC 81 MG tablet   Basaglar KwikPen 100 UNIT/ML   clopidogrel 75 MG tablet Commonly known as: PLAVIX   glimepiride 2 MG tablet Commonly known as: AMARYL   Insulin Pen Needle 32G X 4 MM Misc   irbesartan 75 MG tablet Commonly known as: AVAPRO   meloxicam 15 MG tablet Commonly known as: MOBIC   metoprolol succinate 25 MG 24 hr tablet Commonly known as: TOPROL-XL   rosuvastatin 20 MG tablet Commonly known as: CRESTOR       TAKE these medications    Accu-Chek Softclix Lancets lancets SMARTSIG:Topical 1-4 Times Daily   amLODipine 10 MG tablet Commonly known as: NORVASC Take 1 tablet (10 mg total) by mouth daily.   apixaban 2.5 MG Tabs tablet Commonly known as: ELIQUIS Take 1 tablet (2.5 mg total) by mouth 2 (two) times daily.   atorvastatin 80 MG tablet Commonly known as: LIPITOR Take 1 tablet (80 mg total) by mouth daily.   azelastine 0.05 % ophthalmic solution Commonly known as: OPTIVAR Place 1 drop into both eyes 2 (two) times daily.   blood glucose meter kit and supplies Kit Dispense based on patient and insurance preference. Use up to four times daily as directed.   buPROPion 150 MG 24 hr tablet Commonly known as: WELLBUTRIN XL Take 1 tablet (150 mg total)  by mouth daily.   carvedilol 3.125 MG tablet Commonly known as: COREG Take 1 tablet (3.125 mg total) by mouth 2 (two) times daily with a meal.   citalopram 10 MG tablet Commonly known as: CELEXA Take 1 tablet (10 mg total) by mouth daily.   Darbepoetin Alfa 60 MCG/0.3ML Sosy injection Commonly known as: ARANESP Inject 0.3 mLs (60 mcg total) into the skin every Saturday at 6 PM. Start taking on: November 23, 2022   furosemide 40 MG tablet Commonly known as: LASIX Take 1 tablet (40 mg total) by mouth daily.   pantoprazole 40 MG tablet Commonly known as: PROTONIX Take 40 mg by mouth daily.   pneumococcal 20-valent conjugate vaccine 0.5 ML injection Commonly known as: PREVNAR 20 Inject 0.5 mLs into the muscle tomorrow at 10 am for 1 dose.               Discharge Care Instructions  (From admission, onward)           Start     Ordered   11/21/22 0000  Discharge wound care:       Comments: Per nursing   11/21/22 1109            Disposition and follow-up:   [name] was [facility] from [facility] in Good condition.  At the hospital follow up visit please address:  L pontine CVA -lipid panel 3 months, LDL goal <70 secondary  prevention -apixaban, no ASA or plavix -BP and diabetes optimization -neurology f/u outpatient  HTN -titrate carvedilol as needed, no BP meds that have GFR restriction  T2DM -repeat A1c in 3 months -no meds at discharge, titrate as needed  Nephrotic syndrome -eliquis 2.5 BID -lasix 40 daily -outpatient follow up with nephro -repeat cmp -f/u biopsy -check volume status  Anemia of CKD -follow up CBC -EPA every Saturday per nephrology  Polysubstance use -work on cessation  2. Labs/tests needed at follow up: renal function panel,CBC  3.  Pending labs/ test needing follow-up: Kidney biopsy results from 3/13   Follow-up Appointments:  Follow-up Information     [facility] Follow up.    Specialty: Home Health Services Why: The home health agency will contact you for the first home visit Contact information: [address] [place] [address] [phone]         Penni Bombard, MD. Schedule an appointment as soon as possible for a visit in 1 month(s).   Specialties: Neurology, Radiology Contact information: [address] [place] [address] [place] Follow up.   Why: Call 2-3 days in advance to schedule rides Contact information: [place] by problem list:  Acute left pontine CVA On 11/14/22 she received CT head for code stroke w/o contrast which demonstrated no acute large vessel infarct, hemorrhage, hydrocephalus or mass effect. Follow up MRI brain w/o contrast showed small acute left pontine CVA. Repeat imaging MRI angio head w/o contrast on 11/15/22 were same as previous MRI. She had dysmetria, dysarthria and new onset right side weakness that improved during her hospital stay with acute PT intervention. Will go home with [facility] PT/OT. No DAPT given nephrotic syndrome. On Heparin ggt during admission transitioned to apixaban, ok per neurology. Transitioned from crestor 20 to atorvastatin 80 during admission, LDL found to be 99 above goal of <70 for secondary prevention. BP well controlled during admission. Diabetes well controlled during admission.Will follow up with neurology in the outpatient setting.  Nephrotic Syndrome CKD During her inpatient stay, patient had GFR ranging from 10-12, c/f AKI on CKD vs CKD V. On day 1, she had edema in her face and lower extremity. Per patient, she had been experiencing this swelling for a few weeks and it was increasingly harder to fit her shoes. Renal US with evidence of medically renal disease. Marland Kitchen She was worked up for nephrotic syndrome after having a serum protein gap with albumin <1.5. 24 hour protein >6 g.  ANA,C3,C4,ANCA, myeoloma panel all unrevealing. She was started on lasix '40mg'$  and diuresed well. She appears euvolemicOn 3/13 she underwent a kidney biopsy to determine the etiology of her kidney dysfunction which is still pending, will be followed up by nephrology.. She has minimal soreness on the day post-op, no signs of swelling at the site, blood in urine or other complications.   Microcytic anemia Hgb of 9.8, MCV of 76.2 at admission.Ferritin wnl, iron wnl, TIBC low at 133 consistent with anemia of chronic disease.Hemoglobin stable at discharge at 8. On EPA every Saturday per nephrology.  HTN Patient was hypertensive on her amlodipine '10mg'$  home dose while inpatient. She was started on coreg 3.125 mg BID which resulted in adequate control of her blood pressure during most of admission. She was counseled on the importance of taking both medications  to mitigate impact to her renal dysfunction.   Chest Pain On 11/16/22 patient reported some intermittent chest pain that radiated to her neck. DVT and PE studies were negative. May have been uremic pericarditis. Pain resolved during admission.  T2DM Patient's H1ac was 5.6 during admission. She was managed with SSI while inpatient and received almost no short acting. Fasting glucose primarily <120.Patient was not taking glargine or glimepiride at home, discontinued at discharge.  Asymptomatic pyuria Patient denies suprapubic pain, dysuria, or any other urinary symptoms. UA with pyuria and bacteruria. With her being asymptotic do not believe etiology of her leukocytosis or renal dysfunction  Polysubstance use Endorses snorting cocaine weekly. She became tearful when discussing this. We discussed how this can lead to further strokes and even heart attacks.Encouraged cessation.TOC consulted.  Depression Continued home celexa and wellbutrin.  Leukocytosis On presentation WBC of 16.0, predominant left shift. Denies fever, chills, cough, shortness of  breath or urinary symptoms. Xray without focal opacity. Unclear etiology of this. WBC 10.2 at discharge,afebrile, HDS, no evidence of infection throughout admission.  Discharge Exam:   BP (!) 153/73 (BP Location: Left Arm)   Pulse 79   Temp 98.4 F (36.9 C) (Oral)   Resp 15   Ht '5\' 5"'$  (1.651 m)   Wt 72.2 kg   LMP 10/11/2019   SpO2 100%   BMI 26.49 kg/m  Discharge exam:   Constitutional: well-appearing sitting in bed, in no acute distress Cardiovascular: RRR, no murmurs, rubs or gallops Pulmonary/Chest: normal work of breathing on room air, lungs clear to auscultation bilaterally Abdominal: soft, non-tender, non-distended Skin: warm and dry Extremities: no lower extremity edema present Neuro: Alert and oriented x3  Pertinent Labs, Studies, and Procedures:     Latest Ref Rng & Units 11/21/2022    6:05 AM 11/20/2022    2:44 AM 11/19/2022    2:41 AM  CBC  WBC 4.0 - 10.5 K/uL 10.2  10.2  10.5   Hemoglobin 12.0 - 15.0 g/dL 8.0  8.0  7.9   Hematocrit 36.0 - 46.0 % 24.9  23.5  24.8   Platelets 150 - 400 K/uL 242  202  212        Latest Ref Rng & Units 11/21/2022    6:05 AM 11/20/2022    2:44 AM 11/19/2022    2:41 AM  BMP  Glucose 70 - 99 mg/dL 99  159  165   BUN 6 - 20 mg/dL 44  42  44   Creatinine 0.44 - 1.00 mg/dL 4.99  4.54  4.34   Sodium 135 - 145 mmol/L 142  139  138   Potassium 3.5 - 5.1 mmol/L 4.2  3.8  3.8   Chloride 98 - 111 mmol/L 113  110  110   CO2 22 - 32 mmol/L '23  23  21   '$ Calcium 8.9 - 10.3 mg/dL 7.9  7.9  7.7    VAS US CAROTID  Result Date: 11/17/2022 Carotid Arterial Duplex Study Patient Name:  BEATRIX WICKLIFF  Date of Exam:   11/15/2022 Medical Rec #: [id number]     Accession #:    [id number] Date of Birth: 01/07/1976    Patient Gender: F Patient Age:   6 years Exam Location:  [facility] Procedure:      VAS US CAROTID Referring Phys: Rosalin Hawking --------------------------------------------------------------------------------  Performing Technologist: Darlin Coco RDMS, RVT  Examination Guidelines: A complete evaluation includes B-mode imaging, spectral Doppler, color Doppler, and power Doppler as needed of  all accessible portions of each vessel. Bilateral testing is considered an integral part of a complete examination. Limited examinations for reoccurring indications may be performed as noted.  Right Carotid Findings: +----------+--------+--------+--------+------------------+--------+           PSV cm/sEDV cm/sStenosisPlaque DescriptionComments +----------+--------+--------+--------+------------------+--------+ CCA Prox  68      14                                         +----------+--------+--------+--------+------------------+--------+ CCA Distal54      14              focal mild                 +----------+--------+--------+--------+------------------+--------+ ICA Prox  54      21                                         +----------+--------+--------+--------+------------------+--------+ ICA Mid   52      15                                         +----------+--------+--------+--------+------------------+--------+ ICA Distal29      9                                          +----------+--------+--------+--------+------------------+--------+ ECA       92      17                                         +----------+--------+--------+--------+------------------+--------+ +----------+--------+-------+--------+-------------------+           PSV cm/sEDV cmsDescribeArm Pressure (mmHG) +----------+--------+-------+--------+-------------------+ [id number]                                         +----------+--------+-------+--------+-------------------+ +---------+--------+--+--------+--+ VertebralPSV cm/s95EDV cm/s26 +---------+--------+--+--------+--+  Left Carotid Findings: +----------+--------+--------+--------+------------------+--------+           PSV cm/sEDV cm/sStenosisPlaque  DescriptionComments +----------+--------+--------+--------+------------------+--------+ CCA Prox  91      16                                         +----------+--------+--------+--------+------------------+--------+ CCA Distal77      17                                         +----------+--------+--------+--------+------------------+--------+ ICA Prox  50      17                                         +----------+--------+--------+--------+------------------+--------+ ICA Mid   135     41  tortuous +----------+--------+--------+--------+------------------+--------+ ICA Distal100     31                                         +----------+--------+--------+--------+------------------+--------+ ECA       76      14                                         +----------+--------+--------+--------+------------------+--------+ +----------+--------+--------+--------+-------------------+           PSV cm/sEDV cm/sDescribeArm Pressure (mmHG) +----------+--------+--------+--------+-------------------+ [id number]                                         +----------+--------+--------+--------+-------------------+ +---------+--------+--+--------+--+ VertebralPSV cm/s70EDV cm/s21 +---------+--------+--+--------+--+   Summary: Right Carotid: The extracranial vessels were near-normal with only minimal wall                thickening or plaque. Left Carotid: The extracranial vessels were near-normal with only minimal wall               thickening or plaque. Vertebrals:  Bilateral vertebral arteries demonstrate antegrade flow. Subclavians: Normal flow hemodynamics were seen in bilateral subclavian              arteries. *See table(s) above for measurements and observations.  Electronically signed by Antony Contras MD on 11/17/2022 at 10:11:47 AM.    Final    ECHOCARDIOGRAM COMPLETE  Result Date: 11/15/2022    ECHOCARDIOGRAM REPORT   Patient  Name:   TIONNE AMBLER Date of Exam: 11/15/2022 Medical Rec #:  [id number]    Height:       65.0 in Accession #:    [id number]   Weight:       158.7 lb Date of Birth:  17-Mar-1976   BSA:          1.793 m Patient Age:    35 years     BP:           143/79 mmHg Patient Gender: F            HR:           81 bpm. Exam Location:  Inpatient Procedure: 2D Echo, Cardiac Doppler and Color Doppler Indications:    stroke  History:        Patient has prior history of Echocardiogram examinations. Risk                 Factors:Hypertension, Diabetes and Dyslipidemia.  Sonographer:    Phineas Douglas Referring Phys: [id number] [place]  1. Left ventricular ejection fraction, by estimation, is 60 to 65%. The left ventricle has normal function. The left ventricle has no regional wall motion abnormalities. Left ventricular diastolic parameters are consistent with Grade I diastolic dysfunction (impaired relaxation).  2. Right ventricular systolic function is normal. The right ventricular size is normal. There is normal pulmonary artery systolic pressure. The estimated right ventricular systolic pressure is [id number] mmHg.  3. Left atrial size was severely dilated.  4. The mitral valve is normal in structure. No evidence of mitral valve regurgitation. No evidence of mitral stenosis.  5. The aortic valve is tricuspid. Aortic valve regurgitation is not visualized. No aortic stenosis is present.  6.  The inferior vena cava is normal in size with greater than 50% respiratory variability, suggesting right atrial pressure of 3 mmHg. FINDINGS  Left Ventricle: Left ventricular ejection fraction, by estimation, is 60 to 65%. The left ventricle has normal function. The left ventricle has no regional wall motion abnormalities. The left ventricular internal cavity size was normal in size. There is  no left ventricular hypertrophy. Left ventricular diastolic parameters are consistent with Grade I diastolic dysfunction (impaired relaxation). Right  Ventricle: The right ventricular size is normal. No increase in right ventricular wall thickness. Right ventricular systolic function is normal. There is normal pulmonary artery systolic pressure. The tricuspid regurgitant velocity is 1.98 m/s, and  with an assumed right atrial pressure of 3 mmHg, the estimated right ventricular systolic pressure is [id number] mmHg. Left Atrium: Left atrial size was severely dilated. Right Atrium: Right atrial size was normal in size. Pericardium: Trivial pericardial effusion is present. Mitral Valve: The mitral valve is normal in structure. There is mild calcification of the mitral valve leaflet(s). Mild mitral annular calcification. No evidence of mitral valve regurgitation. No evidence of mitral valve stenosis. Tricuspid Valve: The tricuspid valve is normal in structure. Tricuspid valve regurgitation is trivial. Aortic Valve: The aortic valve is tricuspid. Aortic valve regurgitation is not visualized. No aortic stenosis is present. Pulmonic Valve: The pulmonic valve was normal in structure. Pulmonic valve regurgitation is not visualized. Aorta: The aortic root is normal in size and structure. Venous: The inferior vena cava is normal in size with greater than 50% respiratory variability, suggesting right atrial pressure of 3 mmHg. IAS/Shunts: No atrial level shunt detected by color flow Doppler.  LEFT VENTRICLE PLAX 2D LVIDd:         4.40 cm      Diastology LVIDs:         2.50 cm      LV e' medial:    7.62 cm/s LV PW:         1.30 cm      LV E/e' medial:  11.9 LV IVS:        1.00 cm      LV e' lateral:   9.79 cm/s LVOT diam:     2.00 cm      LV E/e' lateral: 9.3 LV SV:         78 LV SV Index:   43 LVOT Area:     3.14 cm  LV Volumes (MOD) LV vol d, MOD A2C: 113.0 ml LV vol d, MOD A4C: 106.0 ml LV vol s, MOD A2C: 38.0 ml LV vol s, MOD A4C: 43.5 ml LV SV MOD A2C:     75.0 ml LV SV MOD A4C:     106.0 ml LV SV MOD BP:      72.4 ml RIGHT VENTRICLE             IVC RV Basal diam:  3.60 cm      IVC diam: 1.90 cm RV S prime:     12.10 cm/s TAPSE (M-mode): 2.4 cm LEFT ATRIUM              Index        RIGHT ATRIUM           Index LA diam:        3.60 cm  2.01 cm/m   RA Area:     14.90 cm LA Vol (A2C):   102.0 ml 56.89 ml/m  RA Volume:   37.90 ml  21.14 ml/m  LA Vol (A4C):   90.0 ml  50.19 ml/m LA Biplane Vol: 98.8 ml  55.10 ml/m  AORTIC VALVE LVOT Vmax:   104.00 cm/s LVOT Vmean:  67.700 cm/s LVOT VTI:    0.247 m  AORTA Ao Root diam: 2.90 cm Ao Asc diam:  3.00 cm MITRAL VALVE                TRICUSPID VALVE MV Area (PHT): 4.33 cm     TR Peak grad:   15.7 mmHg MV Decel Time: 175 msec     TR Vmax:        198.00 cm/s MV E velocity: 90.80 cm/s MV A velocity: 100.00 cm/s  SHUNTS MV E/A ratio:  0.91         Systemic VTI:  0.25 m                             Systemic Diam: 2.00 cm Dalton McleanMD Electronically signed by Franki Monte Signature Date/Time: 11/15/2022/2:39:16 PM    Final    MR ANGIO HEAD WO CONTRAST  Result Date: 11/15/2022 CLINICAL DATA:  Acute infarct in the left pons on recent MRI head EXAM: MRA HEAD WITHOUT CONTRAST TECHNIQUE: Angiographic images of the Circle of Willis were acquired using MRA technique without intravenous contrast. COMPARISON:  MRI head 11/14/2022, CTA head 12/29/2021 FINDINGS: Anterior circulation: Both internal carotid arteries are patent to the termini, without significant stenosis. A1 segments patent. Normal anterior communicating artery. Anterior cerebral arteries are patent to their distal aspects. No M1 stenosis or occlusion. Normal MCA bifurcations. Distal MCA branches perfused and symmetric. Posterior circulation: Vertebral arteries patent to the vertebrobasilar junction without stenosis. Posterior inferior cerebral arteries patent bilaterally. Basilar patent to its distal aspect. Superior cerebellar arteries patent bilaterally. Patent P1 segments. PCAs perfused to their distal aspects without stenosis. The bilateral posterior communicating arteries are not  visualized. Anatomic variants: None significant IMPRESSION: No intracranial large vessel occlusion or significant stenosis. Electronically Signed   By: Merilyn Baba M.D.   On: 11/15/2022 01:06   DG CHEST PORT 1 VIEW  Result Date: 11/14/2022 CLINICAL DATA:  Leukocytosis EXAM: PORTABLE CHEST 1 VIEW COMPARISON:  12/29/2021 FINDINGS: Single frontal view of the chest demonstrates an unremarkable cardiac silhouette. No airspace disease, effusion, or pneumothorax. No acute bony abnormalities. IMPRESSION: 1. No acute intrathoracic process. Electronically Signed   By: Randa Ngo M.D.   On: 11/14/2022 15:53   US RENAL  Result Date: 11/14/2022 CLINICAL DATA:  AKI EXAM: RENAL / URINARY TRACT ULTRASOUND COMPLETE COMPARISON:  CT abdomen/pelvis 10/27/2019 FINDINGS: Right Kidney: Renal measurements: 9.2 cm x 4.0 cm x 5.4 cm = volume: 105 mL. Parenchymal echogenicity is increased. No mass or hydronephrosis visualized. Left Kidney: Renal measurements: 9.5 cm x 6.6 cm x 6.0 cm = volume: 195 mL. Parenchymal echogenicity is increased. No mass or hydronephrosis visualized. Bladder: Appears normal for degree of bladder distention. Other: None. IMPRESSION: Increased cortical echogenicity bilaterally suggestive of medical renal disease. No hydronephrosis. Electronically Signed   By: Valetta Mole M.D.   On: 11/14/2022 15:13   MR BRAIN WO CONTRAST  Result Date: 11/14/2022 EXAM: MRI HEAD WITHOUT CONTRAST TECHNIQUE: Multiplanar, multiecho pulse sequences of the brain and surrounding structures were obtained without intravenous contrast. COMPARISON:  Same day CT head. FINDINGS: Brain: Small acute infarct in the left pons. No significant edema or mass effect. No evidence of acute hemorrhage, mass lesion, midline shift or hydrocephalus. Patchy white matter  T2/FLAIR hyperintensities, nonspecific but compatible with chronic microvascular ischemic disease. Vascular: Major arterial flow voids are maintained at the skull base. Skull and  upper cervical spine: Normal marrow signal. Sinuses/Orbits: Mild paranasal sinus mucosal thickening. Remote left medial orbital wall fracture. Other: No sizable mastoid effusions. IMPRESSION: Small acute infarct in the left pons. Electronically Signed   By: Margaretha Sheffield M.D.   On: 11/14/2022 11:40   CT HEAD CODE STROKE WO CONTRAST  Result Date: 11/14/2022 CLINICAL DATA:  Code stroke.  Neuro deficit, acute, stroke suspected EXAM: CT HEAD WITHOUT CONTRAST TECHNIQUE: Contiguous axial images were obtained from the base of the skull through the vertex without intravenous contrast. RADIATION DOSE REDUCTION: This exam was performed according to the departmental dose-optimization program which includes automated exposure control, adjustment of the mA and/or kV according to patient size and/or use of iterative reconstruction technique. COMPARISON:  None Available. FINDINGS: Brain: No evidence of acute large vascular territory infarction, hemorrhage, hydrocephalus, extra-axial collection or mass lesion/mass effect. Vascular: No hyperdense vessel identified. Skull: No acute fracture. Sinuses/Orbits: Remote left medial orbital wall fracture. No acute orbital findings. Other: No mastoid effusions. ASPECTS [facility] Stroke Program Early CT Score) total score (0-10 with 10 being normal): 10. IMPRESSION: 1. No evidence of acute large vascular territory infarct or acute hemorrhage. ASPECTS is 10. 2. Remote left basal ganglia perforator infarct. Code stroke imaging results were communicated on 11/14/2022 at 10:30 am to provider stack via secure text paging. Electronically Signed   By: Margaretha Sheffield M.D.   On: 11/14/2022 10:30      Discharge Instructions: Discharge Instructions     Ambulatory referral to Neurology   Complete by: As directed    Follow up with Dr. Leta Baptist at [facility] in 4-6 weeks.  Patient is Dr. Gladstone Lighter patient in the past. Thanks.   Diet - low sodium heart healthy   Complete by: As directed     Discharge wound care:   Complete by: As directed    Per nursing   Increase activity slowly   Complete by: As directed       You were hospitalized for a stroke. You will need to follow up with your primary care doctor to manage blood pressure, cholesterol and diabetes. Your diabetes medications were stopped in the hospital. You will need to see if your primary care doctor wants to start medications. You were started on atorvastatin '80mg'$  daily for cholesterol.Your blood pressure was controlled with amlodipine '10mg'$  once daily and carvedilol 3.'125mg'$  twice daily, take these at home. To prevent future stroke you were started on a blood thinner called eliquis 2.'5mg'$  to be taken twice per day. It is important to take this daily. You will follow up with the neurologists outpatient in 4-6 weeks. You had a significant decline in your kidney function. You were found to have a condition called nephrotic syndrome. It is unclear what caused it. You got a biopsy that will be followed up when you see the nephrology team outpatient. You will take lasix 40 mg daily to prevent fluid from accumulating. The blood thinner eliquis is also important for this condition as it makes you a higher risk for blood clots. Signed: Iona Coach, MD 11/21/2022, 11:45 AM   Iona Coach, MD Internal Medicine Resident, PGY-1 [facility]  Pager: [phone]

## 2022-11-21 NOTE — Progress Notes (Signed)
10AM medications pulled from pyxis and misplaced, re-pulled and administered 10AM medications from pyxis at 1203

## 2022-11-21 NOTE — Progress Notes (Signed)
Physical Therapy Treatment Patient Details Name: Stacey Lucas MRN: [id number] DOB: 12-16-1975 Today's Date: 11/21/2022   History of Present Illness 47 yo female presents to [facility] on 3/7 with R weakness, slurred speech. MRI brain shows Small acute infarct in the left pons. PMH includes PMH significant for previous stroke in 2023 (L basal ganglia), anxiety/depression/suicidal ideation, diabetes, hyperlipidemia, hypertension, migraines, cocaine abuse.    PT Comments    Pt's goals is to progress to quad cane, worked with this during session but she is not ready to use it yet except with therapy. Needed up to mod A to prevent fall with 2 LOB. With RW pt much more steady with ability to ambulate with supervision. Pt ambulated 120' with cane and RW. Recommend quad cane to progress mobility at home with HHPT. SHe continues to have a broken handle on rollator but she has a RW at home she can use and this is advised for now. PT will continue to follow.    Recommendations for follow up therapy are one component of a multi-disciplinary discharge planning process, led by the attending physician.  Recommendations may be updated based on patient status, additional functional criteria and insurance authorization.  Follow Up Recommendations  Home health PT     Assistance Recommended at Discharge PRN  Patient can return home with the following A little help with walking and/or transfers;A little help with bathing/dressing/bathroom   Equipment Recommendations  Cane (quad cane for use with HHPT)    Recommendations for Other Services       Precautions / Restrictions Precautions Precautions: Fall Restrictions Weight Bearing Restrictions: No     Mobility  Bed Mobility Overal bed mobility: Independent             General bed mobility comments: EOB upon entry and at exit    Transfers Overall transfer level: Needs assistance Equipment used: Quad cane, Rolling walker (2 wheels) Transfers: Sit  to/from Stand Sit to Stand: Min assist           General transfer comment: MinA to rise with cane from edge of bed. Increased time    Ambulation/Gait Ambulation/Gait assistance: Min assist, Supervision, Mod assist Gait Distance (Feet): 120 Feet Assistive device: Rolling walker (2 wheels), Quad cane Gait Pattern/deviations: Decreased dorsiflexion - right, Trunk flexed, Knee hyperextension - right, Step-through pattern, Decreased step length - right, Decreased dorsiflexion - left, Staggering left Gait velocity: decreased Gait velocity interpretation: <1.8 ft/sec, indicate of risk for recurrent falls   General Gait Details: pt wanted to use cane so began with quad cane but pt unable to coordinate stepping. Therefore, switched to RW and let her "warm up" with this. She ambulated 25' and felt ready for cane. Pt held quad cane on R side. Min A for most of walking but had 2 LOB requiring mod A to prevent falling. Performance declined as she fatigued as well and she wisely chose to switch back to RW for last 25'.   Stairs             Wheelchair Mobility    Modified Rankin (Stroke Patients Only) Modified Rankin (Stroke Patients Only) Pre-Morbid Rankin Score: Slight disability Modified Rankin: Moderately severe disability     Balance Overall balance assessment: Needs assistance Sitting-balance support: No upper extremity supported, Feet supported Sitting balance-Leahy Scale: Good     Standing balance support: During functional activity, Single extremity supported Standing balance-Leahy Scale: Poor Standing balance comment: reliant on at least single UE support statically  Cognition Arousal/Alertness: Awake/alert Behavior During Therapy: WFL for tasks assessed/performed Overall Cognitive Status: Impaired/Different from baseline Area of Impairment: Problem solving, Memory, Awareness                       Following Commands:  Follows one step commands consistently, Follows one step commands with increased time, Follows multi-step commands inconsistently Safety/Judgement: Decreased awareness of safety, Decreased awareness of deficits Awareness: Emergent Problem Solving: Requires verbal cues General Comments: pt with limited insight into balance deficits before she mobilized. Though aware once she began to lose balance        Exercises      General Comments General comments (skin integrity, edema, etc.): discussed use of quad cane with HHPT but consistent use of RW at home to prevent falls. Pt in agreement      Pertinent Vitals/Pain Pain Assessment Pain Assessment: No/denies pain    Home Living                          Prior Function            PT Goals (current goals can now be found in the care plan section) Acute Rehab PT Goals Patient Stated Goal: return home, walk with quad cane PT Goal Formulation: With patient Time For Goal Achievement: 11/28/22 Potential to Achieve Goals: Good Progress towards PT goals: Progressing toward goals    Frequency    Min 4X/week      PT Plan Current plan remains appropriate    Co-evaluation              AM-PAC PT "6 Clicks" Mobility   Outcome Measure  Help needed turning from your back to your side while in a flat bed without using bedrails?: None Help needed moving from lying on your back to sitting on the side of a flat bed without using bedrails?: None Help needed moving to and from a bed to a chair (including a wheelchair)?: A Little Help needed standing up from a chair using your arms (e.g., wheelchair or bedside chair)?: A Little Help needed to walk in hospital room?: A Little Help needed climbing 3-5 steps with a railing? : A Little 6 Click Score: 20    End of Session Equipment Utilized During Treatment: Gait belt Activity Tolerance: Patient tolerated treatment well Patient left: in bed;with call bell/phone within  reach;with nursing/sitter in room Nurse Communication: Mobility status PT Visit Diagnosis: Other abnormalities of gait and mobility (R26.89);Muscle weakness (generalized) (M62.81)     Time: [id number] PT Time Calculation (min) (ACUTE ONLY): 25 min  Charges:  $Gait Training: 23-37 mins                     [place] chat preferred Office [place] 11/21/2022, 1:33 PM

## 2022-11-21 NOTE — Progress Notes (Signed)
Nsg Discharge Note  Admit Date:  11/14/2022 Discharge date: 11/21/2022   Stacey Lucas to be D/C'd Home per MD order.  AVS completed.  Patient/caregiver able to verbalize understanding.  Discharge Medication: Allergies as of 11/21/2022       Reactions   Gabapentin    Weakness/impairs balance        Medication List     STOP taking these medications    Accu-Chek Guide test strip Generic drug: glucose blood   aspirin EC 81 MG tablet   Basaglar KwikPen 100 UNIT/ML   clopidogrel 75 MG tablet Commonly known as: PLAVIX   glimepiride 2 MG tablet Commonly known as: AMARYL   Insulin Pen Needle 32G X 4 MM Misc   irbesartan 75 MG tablet Commonly known as: AVAPRO   meloxicam 15 MG tablet Commonly known as: MOBIC   metoprolol succinate 25 MG 24 hr tablet Commonly known as: TOPROL-XL   rosuvastatin 20 MG tablet Commonly known as: CRESTOR       TAKE these medications    Accu-Chek Softclix Lancets lancets SMARTSIG:Topical 1-4 Times Daily   amLODipine 10 MG tablet Commonly known as: NORVASC Take 1 tablet (10 mg total) by mouth daily.   atorvastatin 80 MG tablet Commonly known as: LIPITOR Take 1 tablet (80 mg total) by mouth daily.   azelastine 0.05 % ophthalmic solution Commonly known as: OPTIVAR Place 1 drop into both eyes 2 (two) times daily.   blood glucose meter kit and supplies Kit Dispense based on patient and insurance preference. Use up to four times daily as directed.   buPROPion 150 MG 24 hr tablet Commonly known as: WELLBUTRIN XL Take 1 tablet (150 mg total) by mouth daily.   carvedilol 3.125 MG tablet Commonly known as: COREG Take 1 tablet (3.125 mg total) by mouth 2 (two) times daily with a meal.   citalopram 10 MG tablet Commonly known as: CELEXA Take 1 tablet (10 mg total) by mouth daily.   Darbepoetin Alfa 60 MCG/0.3ML Sosy injection Commonly known as: ARANESP Inject 0.3 mLs (60 mcg total) into the skin every Saturday at 6 PM. Start  taking on: November 23, 2022   Eliquis 2.5 MG Tabs tablet Generic drug: apixaban Take 1 tablet (2.5 mg total) by mouth 2 (two) times daily.   furosemide 40 MG tablet Commonly known as: LASIX Take 1 tablet (40 mg total) by mouth daily.   pantoprazole 40 MG tablet Commonly known as: PROTONIX Take 40 mg by mouth daily.   pneumococcal 20-valent conjugate vaccine 0.5 ML injection Commonly known as: PREVNAR 20 Inject 0.5 mLs into the muscle tomorrow at 10 am for 1 dose.               Durable Medical Equipment  (From admission, onward)           Start     Ordered   11/21/22 1246  For home use only DME Cane  Once       Comments: Quad cane   11/21/22 1246              Discharge Care Instructions  (From admission, onward)           Start     Ordered   11/21/22 0000  Discharge wound care:       Comments: Per nursing   11/21/22 1109            Discharge Assessment: Vitals:   11/21/22 0721 11/21/22 1121  BP: (!) 165/78 (!) 153/73  Pulse: 73 79  Resp: 15 15  Temp: 98.1 F (36.7 C) 98.4 F (36.9 C)  SpO2: 97% 100%   Skin clean, dry and intact without evidence of skin break down, no evidence of skin tears noted. IV catheter discontinued intact. Site without signs and symptoms of complications - no redness or edema noted at insertion site, patient denies c/o pain - only slight tenderness at site.  Dressing with slight pressure applied.  D/c Instructions-Education: Discharge instructions given to patient/family with verbalized understanding. D/c education completed with patient/family including follow up instructions, medication list, d/c activities limitations if indicated, with other d/c instructions as indicated by MD - patient able to verbalize understanding, all questions fully answered. Patient instructed to return to ED, call 911, or call MD for any changes in condition.  Patient escorted via [place], and D/C home via private auto.  Atilano Ina,  RN 11/21/2022 1:38 PM

## 2022-11-21 NOTE — Progress Notes (Signed)
SLP Cancellation Note  Patient Details Name: Azelin Devault MRN: [id number] DOB: Nov 05, 1975   Cancelled treatment:       Reason Eval/Treat Not Completed: Patient at procedure or test/unavailable (Pt currently with MDs.)  Tobie Poet I. Hardin Negus, [place], [place] Office number [phone]  Horton Marshall 11/21/2022, 11:41 AM

## 2022-11-21 NOTE — Plan of Care (Signed)
Problem: Education: Goal: Knowledge of disease or condition will improve Outcome: Adequate for Discharge Goal: Knowledge of secondary prevention will improve (MUST DOCUMENT ALL) Outcome: Adequate for Discharge Goal: Knowledge of patient specific risk factors will improve (Mark N/A or DELETE if not current risk factor) Outcome: Adequate for Discharge   Problem: Ischemic Stroke/TIA Tissue Perfusion: Goal: Complications of ischemic stroke/TIA will be minimized Outcome: Adequate for Discharge   Problem: Coping: Goal: Will verbalize positive feelings about self Outcome: Adequate for Discharge Goal: Will identify appropriate support needs Outcome: Adequate for Discharge   Problem: Health Behavior/Discharge Planning: Goal: Ability to manage health-related needs will improve Outcome: Adequate for Discharge Goal: Goals will be collaboratively established with patient/family Outcome: Adequate for Discharge   Problem: Self-Care: Goal: Ability to participate in self-care as condition permits will improve Outcome: Adequate for Discharge Goal: Verbalization of feelings and concerns over difficulty with self-care will improve Outcome: Adequate for Discharge Goal: Ability to communicate needs accurately will improve Outcome: Adequate for Discharge   Problem: Nutrition: Goal: Risk of aspiration will decrease Outcome: Adequate for Discharge Goal: Dietary intake will improve Outcome: Adequate for Discharge   Problem: Education: Goal: Knowledge of General Education information will improve Description: Including pain rating scale, medication(s)/side effects and non-pharmacologic comfort measures Outcome: Adequate for Discharge   Problem: Health Behavior/Discharge Planning: Goal: Ability to manage health-related needs will improve Outcome: Adequate for Discharge   Problem: Clinical Measurements: Goal: Ability to maintain clinical measurements within normal limits will improve Outcome:  Adequate for Discharge Goal: Will remain free from infection Outcome: Adequate for Discharge Goal: Diagnostic test results will improve Outcome: Adequate for Discharge Goal: Respiratory complications will improve Outcome: Adequate for Discharge Goal: Cardiovascular complication will be avoided Outcome: Adequate for Discharge   Problem: Activity: Goal: Risk for activity intolerance will decrease Outcome: Adequate for Discharge   Problem: Nutrition: Goal: Adequate nutrition will be maintained Outcome: Adequate for Discharge   Problem: Coping: Goal: Level of anxiety will decrease Outcome: Adequate for Discharge   Problem: Elimination: Goal: Will not experience complications related to bowel motility Outcome: Adequate for Discharge Goal: Will not experience complications related to urinary retention Outcome: Adequate for Discharge   Problem: Pain Managment: Goal: General experience of comfort will improve Outcome: Adequate for Discharge   Problem: Safety: Goal: Ability to remain free from injury will improve Outcome: Adequate for Discharge   Problem: Skin Integrity: Goal: Risk for impaired skin integrity will decrease Outcome: Adequate for Discharge   Problem: Education: Goal: Ability to describe self-care measures that may prevent or decrease complications (Diabetes Survival Skills Education) will improve Outcome: Adequate for Discharge Goal: Individualized Educational Video(s) Outcome: Adequate for Discharge   Problem: Coping: Goal: Ability to adjust to condition or change in health will improve Outcome: Adequate for Discharge   Problem: Fluid Volume: Goal: Ability to maintain a balanced intake and output will improve Outcome: Adequate for Discharge   Problem: Health Behavior/Discharge Planning: Goal: Ability to identify and utilize available resources and services will improve Outcome: Adequate for Discharge Goal: Ability to manage health-related needs will  improve Outcome: Adequate for Discharge   Problem: Metabolic: Goal: Ability to maintain appropriate glucose levels will improve Outcome: Adequate for Discharge   Problem: Nutritional: Goal: Maintenance of adequate nutrition will improve Outcome: Adequate for Discharge Goal: Progress toward achieving an optimal weight will improve Outcome: Adequate for Discharge   Problem: Skin Integrity: Goal: Risk for impaired skin integrity will decrease Outcome: Adequate for Discharge   Problem: Tissue Perfusion: Goal: Adequacy   of tissue perfusion will improve Outcome: Adequate for Discharge   

## 2022-11-21 NOTE — TOC Transition Note (Addendum)
Transition of Care [facility]) - CM/SW Discharge Note   Patient Details  Name: Stacey Lucas MRN: [id number] Date of Birth: 04/12/1976  Transition of Care [facility] CM/SW Contact:  Pollie Friar, RN Phone Number: 11/21/2022, 11:34 AM   Clinical Narrative:    Pt is discharging home with home health services through [place]. Cory with [facility] aware of d/c home today.  Pt has needed DME at home.  CM provided her co pay card for Eliquis.  Aide forms sent to pts medicaid to see if she qualifies for assistance at home. Medicaid CM will contact the patient if she qualifies to arrange caregivers. Quad cane ordered through [employer] and will be delivered to the room. Pt has supervision at home and transportation to home.   Final next level of care: Home w Home Health Services Barriers to Discharge: No Barriers Identified   Patient Goals and CMS Choice CMS Medicare.gov Compare Post Acute Care list provided to:: Patient Choice offered to / list presented to : Patient  Discharge Placement                         Discharge Plan and Services Additional resources added to the After Visit Summary for     Discharge Planning Services: CM Consult Post Acute Care Choice: Home Health                    HH Arranged: PT, OT, Speech Therapy HH Agency: [place] Date [facility] Agency Contacted: 11/21/22   Representative spoke with at [place]: [place] ([place]) Interventions SDOH Screenings   Food Insecurity: Food Insecurity Present (11/14/2022)  Housing: Medium Risk (11/14/2022)  Transportation Needs: Unmet Transportation Needs (11/14/2022)  Utilities: At Risk (11/14/2022)  Alcohol Screen: Low Risk  (04/25/2018)  Depression (PHQ2-9): Low Risk  (04/18/2022)  Tobacco Use: High Risk (11/19/2022)     Readmission Risk Interventions     No data to display

## 2022-11-21 NOTE — Progress Notes (Signed)
Mobility Specialist: Progress Note   11/21/22 0946  Mobility  Activity Ambulated with assistance in hallway  Level of Assistance Contact guard assist, steadying assist  Assistive Device Front wheel walker  Distance Ambulated (ft) 280 ft (140'x2)  Activity Response Tolerated well  Mobility Referral Yes  $Mobility charge 1 Mobility   Pt received in the bed and agreeable to mobility. Mod I with bed mobility as well as to stand. Contact guard during ambulation for balance. Cues for RW direction and management as pt has tendency to drift to the Rt. Stopped x1 for standing break secondary to fatigue. Pt sitting EOB to eat her breakfast after session with call bell and phone in reach. Bed alarm is on.   [place] Yasmin Bronaugh Mobility Specialist Please contact via SecureChat or Rehab office at [phone]

## 2022-11-21 NOTE — Progress Notes (Signed)
ANTICOAGULATION CONSULT NOTE - Follow Up Consult  Pharmacy Consult for discontinuing CIUFH and commencing DOAC (apixaban 2.'5mg'$  PO BID) Indication:  Nephrotic syndrome and recent pontine stroke.  Allergies  Allergen Reactions   Gabapentin     Weakness/impairs balance    Patient Measurements: Height: '5\' 5"'$  (165.1 cm) Weight: 72.2 kg (159 lb 3.2 oz) IBW/kg (Calculated) : 57 Heparin Dosing Weight: 72.2  Vital Signs: Temp: 98.1 F (36.7 C) (03/14 0721) Temp Source: Oral (03/14 0721) BP: 165/78 (03/14 0721) Pulse Rate: 73 (03/14 0721)  Labs: Recent Labs    11/19/22 0241 11/19/22 1739 11/20/22 0244 11/21/22 0605  HGB 7.9*  --  8.0* 8.0*  HCT 24.8*  --  23.5* 24.9*  PLT 212  --  202 242  LABPROT  --   --  12.3  --   INR  --   --  0.9  --   HEPARINUNFRC 0.29* 0.41 0.37 0.30  CREATININE 4.34*  --  4.54* 4.99*    Estimated Creatinine Clearance: 13.9 mL/min (A) (by C-G formula based on SCr of 4.99 mg/dL (H)).   Medications:  Scheduled:   amLODipine  10 mg Oral Daily   apixaban  2.5 mg Oral BID   atorvastatin  80 mg Oral Daily   buPROPion  150 mg Oral Daily   carvedilol  3.125 mg Oral BID WC   citalopram  10 mg Oral Daily   darbepoetin (ARANESP) injection - NON-DIALYSIS  60 mcg Subcutaneous Q Sat-1800   furosemide  40 mg Oral Daily   insulin aspart  0-9 Units Subcutaneous TID WC   ketotifen  2 drop Both Eyes BID   pneumococcal 20-valent conjugate vaccine  0.5 mL Intramuscular Tomorrow-1000    Assessment: Team wishes to switch patient from [place] to oral DOAC, apixaban 2.5 milligrams, by mouth, twice-daily. Plan: Discontinue CIUFH, commence Apixaban.   Pennie Banter, PharmD, CACP, CPP 11/21/2022,11:09 AM

## 2022-11-22 ENCOUNTER — Telehealth: Payer: Self-pay

## 2022-11-22 NOTE — Transitions of Care (Post Inpatient/ED Visit) (Signed)
   11/22/2022  Name: Stacey Lucas MRN: [id number] DOB: [birth year]  Today's TOC FU Call Status: Today's TOC FU Call Status:: Unsuccessul Call (1st Attempt) Unsuccessful Call (1st Attempt) Date: 11/22/22  Attempted to reach the patient regarding the most recent Inpatient/ED visit.  Follow Up Plan: Additional outreach attempts will be made to reach the patient to complete the Transitions of Care (Post Inpatient/ED visit) call.      Enzo Montgomery, RN,BSN,CCM [facility] Health/[facility] Care Management Care Management Community Coordinator Direct Phone: [phone] Toll Free: [phone] Fax: [phone]

## 2022-11-25 ENCOUNTER — Telehealth: Payer: Self-pay

## 2022-11-25 NOTE — Transitions of Care (Post Inpatient/ED Visit) (Signed)
   11/25/2022  Name: Stacey Lucas MRN: [id number] DOB: 12/01/1975  Today's TOC FU Call Status: Today's TOC FU Call Status:: Unsuccessful Call (2nd Attempt) Unsuccessful Call (2nd Attempt) Date: 11/25/22  Attempted to reach the patient regarding the most recent Inpatient/ED visit.  Follow Up Plan: Additional outreach attempts will be made to reach the patient to complete the Transitions of Care (Post Inpatient/ED visit) call.     Enzo Montgomery, RN,BSN,CCM [facility] Care Management Care Management Community Coordinator Direct Phone: [phone] Toll Free: [phone] Fax: [phone]

## 2022-11-25 NOTE — Transitions of Care (Post Inpatient/ED Visit) (Signed)
   11/25/2022  Name: Kamilya Ditoro MRN: [id number] DOB: 06-23-1976  Today's TOC FU Call Status: Today's TOC FU Call Status:: Unsuccessful Call (3rd Attempt) Unsuccessful Call (3rd Attempt) Date: 11/25/22  Attempted to reach the patient regarding the most recent Inpatient/ED visit.  Follow Up Plan: No further outreach attempts will be made at this time. We have been unable to contact the patient.     Enzo Montgomery, RN,BSN,CCM [facility] Health/[facility] Care Management Care Management Community Coordinator Direct Phone: [phone] Toll Free: [phone] Fax: [phone]

## 2022-11-26 ENCOUNTER — Encounter (HOSPITAL_COMMUNITY): Payer: Self-pay

## 2022-11-26 LAB — SURGICAL PATHOLOGY

## 2022-11-28 ENCOUNTER — Telehealth: Payer: Self-pay | Admitting: Critical Care Medicine

## 2022-11-28 NOTE — Telephone Encounter (Signed)
Copied from [place] [id number]. Topic: General - Inquiry >> Nov 28, 2022 10:53 AM Marcellus Scott wrote: Reason for CRM: Pt stated that when she was discharged from the hospital, she was advised she would be provided with a home health aid and PT.  Pt stated that she has not heard from anyone.  Please advise.

## 2022-12-01 NOTE — Progress Notes (Unsigned)
Patient ID: Stacey Lucas, female   DOB: 08/05/1976, 47 y.o.   MRN: [id number]     Stacey Lucas, is a 47 y.o. female No chief complaint on file.      Subjective:   Stacey Lucas is a 47 y.o. female here today for swelling of feet and face when she scheduled the appt a few weeks ago.    However today she awakened with slurred speech and progressive R sided weakness.  The patient was brought back in a wheelchair.  I am unfamiliar with a recent baseline with the patient and Immediately grabbed her PCP Dr Asencion Noble.  Denies recent drug use  12/03/22    Name: Stacey Lucas MRN: [id number] DOB: 1976/08/18 47 y.o. PCP: Elsie Stain, MD   Date of Admission: 11/14/2022 10:01 AM Date of Discharge: 11/21/2022 Attending Physician: Velna Ochs, MD   Discharge Diagnosis: 1. Active Problems:   Acute CVA (cerebrovascular accident) ([place])   Nephrotic syndrome   Cerebrovascular accident (CVA) ([place])   AKI (acute kidney injury) ([place]) T2DM HTN Hyperlipidemia Asymptomatic pyuria Anemia of chronic disease  L pontine CVA -lipid panel 3 months, LDL goal <70 secondary prevention -apixaban, no ASA or plavix -BP and diabetes optimization -neurology f/u outpatient   HTN -titrate carvedilol as needed, no BP meds that have GFR restriction   T2DM -repeat A1c in 3 months -no meds at discharge, titrate as needed   Nephrotic syndrome -eliquis 2.5 BID -lasix 40 daily -outpatient follow up with nephro -repeat cmp -f/u biopsy -check volume status   Anemia of CKD -follow up CBC -EPA every Saturday per nephrology   Polysubstance use -work on cessation   2. Labs/tests needed at follow up: renal function panel,CBC   3.  Pending labs/ test needing follow-up: Kidney biopsy results from 3/13    Hospital Course by problem list:   Acute left pontine CVA On 11/14/22 she received CT head for code stroke w/o contrast which demonstrated no acute large vessel infarct, hemorrhage,  hydrocephalus or mass effect. Follow up MRI brain w/o contrast showed small acute left pontine CVA. Repeat imaging MRI angio head w/o contrast on 11/15/22 were same as previous MRI. She had dysmetria, dysarthria and new onset right side weakness that improved during her hospital stay with acute PT intervention. Will go home with [facility] PT/OT. No DAPT given nephrotic syndrome. On Heparin ggt during admission transitioned to apixaban, ok per neurology. Transitioned from crestor 20 to atorvastatin 80 during admission, LDL found to be 99 above goal of <70 for secondary prevention. BP well controlled during admission. Diabetes well controlled during admission.Will follow up with neurology in the outpatient setting.   Nephrotic Syndrome CKD During her inpatient stay, patient had GFR ranging from 10-12, c/f AKI on CKD vs CKD V. On day 1, she had edema in her face and lower extremity. Per patient, she had been experiencing this swelling for a few weeks and it was increasingly harder to fit her shoes. Renal US with evidence of medically renal disease. Marland Kitchen She was worked up for nephrotic syndrome after having a serum protein gap with albumin <1.5. 24 hour protein >6 g. ANA,C3,C4,ANCA, myeoloma panel all unrevealing. She was started on lasix 40mg  and diuresed well. She appears euvolemicOn 3/13 she underwent a kidney biopsy to determine the etiology of her kidney dysfunction which is still pending, will be followed up by nephrology.. She has minimal soreness on the day post-op, no signs of swelling at the site, blood in urine or other complications.  Microcytic anemia Hgb of 9.8, MCV of 76.2 at admission.Ferritin wnl, iron wnl, TIBC low at 133 consistent with anemia of chronic disease.Hemoglobin stable at discharge at 8. On EPA every Saturday per nephrology.   HTN Patient was hypertensive on her amlodipine 10mg  home dose while inpatient. She was started on coreg 3.125 mg BID which resulted in adequate control of her blood  pressure during most of admission. She was counseled on the importance of taking both medications to mitigate impact to her renal dysfunction.    Chest Pain On 11/16/22 patient reported some intermittent chest pain that radiated to her neck. DVT and PE studies were negative. May have been uremic pericarditis. Pain resolved during admission.   T2DM Patient's H1ac was 5.6 during admission. She was managed with SSI while inpatient and received almost no short acting. Fasting glucose primarily <120.Patient was not taking glargine or glimepiride at home, discontinued at discharge.   Asymptomatic pyuria Patient denies suprapubic pain, dysuria, or any other urinary symptoms. UA with pyuria and bacteruria. With her being asymptotic do not believe etiology of her leukocytosis or renal dysfunction   Polysubstance use Endorses snorting cocaine weekly. She became tearful when discussing this. We discussed how this can lead to further strokes and even heart attacks.Encouraged cessation.TOC consulted.   Depression Continued home celexa and wellbutrin.   Leukocytosis On presentation WBC of 16.0, predominant left shift. Denies fever, chills, cough, shortness of breath or urinary symptoms. Xray without focal opacity. Unclear etiology of this. WBC 10.2 at discharge,afebrile, HDS, no evidence of infection throughout admission.      No problems updated.  ALLERGIES: Allergies  Allergen Reactions   Gabapentin     Weakness/impairs balance    PAST MEDICAL HISTORY: Past Medical History:  Diagnosis Date   Anxiety and depression    Cocaine abuse ([place])    Depression with suicidal ideation    [place] admission 04/2018   Diabetes mellitus without complication (HCC)    Type II   GERD (gastroesophageal reflux disease)    Hyperlipidemia    Hypertension    Impingement syndrome of right shoulder    Migraine headache    Osteoarthritis of right knee 05/24/2021   Pilonidal abscess 05/2013   Pyelonephritis     Right thyroid nodule    Sciatica     MEDICATIONS AT HOME: Prior to Admission medications   Medication Sig Start Date End Date Taking? Authorizing Provider  ACCU-CHEK GUIDE test strip USE UP TO FOUR TIMES DAILY AS DIRECTED 01/31/22  Yes [provider]  Accu-Chek Softclix Lancets lancets SMARTSIG:Topical 1-4 Times Daily 01/31/22  Yes [provider]  amLODipine (NORVASC) 10 MG tablet Take 1 tablet (10 mg total) by mouth daily. 04/18/22  Yes Elsie Stain, MD  aspirin EC 81 MG tablet Take 1 tablet (81 mg total) by mouth daily. Swallow whole. 04/18/22  Yes Elsie Stain, MD  azelastine (OPTIVAR) 0.05 % ophthalmic solution Place 1 drop into both eyes 2 (two) times daily. 08/21/22  Yes Brunetta Jeans, PA-C  blood glucose meter kit and supplies KIT Dispense based on patient and insurance preference. Use up to four times daily as directed. 01/31/22  Yes [name], MD  buPROPion (WELLBUTRIN XL) 150 MG 24 hr tablet TAKE 1 TABLET(150 MG) BY MOUTH DAILY 04/18/22  Yes Elsie Stain, MD  clopidogrel (PLAVIX) 75 MG tablet TAKE 1 TABLET(75 MG) BY MOUTH DAILY 05/15/22  Yes Elsie Stain, MD  diclofenac Sodium (VOLTAREN) 1 % GEL Apply 4 g  topically daily as needed (For knee pain). 04/18/22  Yes Elsie Stain, MD  Insulin Glargine [facility] KWIKPEN) 100 UNIT/ML Inject 5 Units into the skin daily. 04/18/22  Yes Elsie Stain, MD  Insulin Pen Needle 32G X 4 MM MISC Use to inject insulin at bed time 04/18/22  Yes Elsie Stain, MD  meloxicam (MOBIC) 15 MG tablet TAKE 1 TABLET(15 MG) BY MOUTH DAILY 07/01/22  Yes Elsie Stain, MD  metoprolol succinate (TOPROL-XL) 25 MG 24 hr tablet TAKE 3 TABLETS(75 MG) BY MOUTH DAILY 08/16/22  Yes Elsie Stain, MD  rosuvastatin (CRESTOR) 20 MG tablet TAKE 1 TABLET(20 MG) BY MOUTH AT BEDTIME 11/05/22  Yes Elsie Stain, MD  citalopram (CELEXA) 10 MG tablet Take 1 tablet (10 mg total) by mouth daily. 11/14/22   Argentina Donovan,  PA-C  glimepiride (AMARYL) 2 MG tablet Take 1 tablet (2 mg total) by mouth daily with breakfast. 11/14/22   Argentina Donovan, PA-C  irbesartan (AVAPRO) 75 MG tablet Take 1 tablet (75 mg total) by mouth daily. 11/14/22   Argentina Donovan, PA-C     Objective:   There were no vitals filed for this visit.  Exam General appearance : Awake, alert, sitting at rest in wheelchair. Speech slurred. Not toxic looking.  Emergently attended by myself then Dr Joya Gaskins as well.  81mg  aspirin given at 9:25 am HEENT: Atraumatic and Normocephalic, pupils equally reactive to light and accomodation Neck: Supple, no JVD. No cervical lymphadenopathy.  Chest: Good air entry bilaterally, CTAB.  No rales/rhonchi/wheezing CVS: S1 S2 regular, no murmurs.  Extremities: B/L Lower Ext shows no edema, both legs are warm to touch Neurology: Awake alert, and oriented X 3, R face with mild droop, R sided weakness and delay with movement.   Skin: No Rash  Data Review Lab Results  Component Value Date   HGBA1C 5.6 11/14/2022   HGBA1C 5.6 11/14/2022   HGBA1C 7.7 (H) 03/05/2022    Assessment & Plan   1. Right sided weakness 911 called and requested stroke care - aspirin chewable tablet 81 mg  2. Uncontrolled type 2 diabetes mellitus with hyperglycemia (HCC) A1C much improved today - Glucose (CBG) - HgB A1c - glimepiride (AMARYL) 2 MG tablet; Take 1 tablet (2 mg total) by mouth daily with breakfast.  Dispense: 60 tablet; Refill: 2  3. Slurred speech Speech worsened acutely in office - aspirin chewable tablet 81 mg  4. At increased risk for emergency hospital admission Sent out via EMS  5. MDD (major depressive disorder), recurrent severe, without psychosis ([place]) - citalopram (CELEXA) 10 MG tablet; Take 1 tablet (10 mg total) by mouth daily.  Dispense: 60 tablet; Refill: 3  6. Essential hypertension Refills sent bc she needed these - irbesartan (AVAPRO) 75 MG tablet; Take 1 tablet (75 mg total) by mouth  daily.  Dispense: 60 tablet; Refill: 2    No follow-ups on file.

## 2022-12-02 NOTE — Telephone Encounter (Signed)
Called patient and told her she needs to speak with [facility] for pt and home health. Patient has appointment tomorrow, I stated that if there is anything we needed to do we can do it during that visit.

## 2022-12-03 ENCOUNTER — Ambulatory Visit: Payer: BC Managed Care – PPO | Attending: Critical Care Medicine | Admitting: Critical Care Medicine

## 2022-12-03 ENCOUNTER — Encounter: Payer: Self-pay | Admitting: Critical Care Medicine

## 2022-12-03 ENCOUNTER — Telehealth: Payer: Self-pay

## 2022-12-03 VITALS — BP 152/91 | HR 76 | Wt 148.0 lb

## 2022-12-03 DIAGNOSIS — Z8673 Personal history of transient ischemic attack (TIA), and cerebral infarction without residual deficits: Secondary | ICD-10-CM | POA: Diagnosis not present

## 2022-12-03 DIAGNOSIS — R8281 Pyuria: Secondary | ICD-10-CM | POA: Insufficient documentation

## 2022-12-03 DIAGNOSIS — E1169 Type 2 diabetes mellitus with other specified complication: Secondary | ICD-10-CM | POA: Diagnosis not present

## 2022-12-03 DIAGNOSIS — Z7984 Long term (current) use of oral hypoglycemic drugs: Secondary | ICD-10-CM | POA: Diagnosis not present

## 2022-12-03 DIAGNOSIS — N179 Acute kidney failure, unspecified: Secondary | ICD-10-CM | POA: Insufficient documentation

## 2022-12-03 DIAGNOSIS — I10 Essential (primary) hypertension: Secondary | ICD-10-CM | POA: Diagnosis not present

## 2022-12-03 DIAGNOSIS — D631 Anemia in chronic kidney disease: Secondary | ICD-10-CM | POA: Diagnosis not present

## 2022-12-03 DIAGNOSIS — F17210 Nicotine dependence, cigarettes, uncomplicated: Secondary | ICD-10-CM | POA: Insufficient documentation

## 2022-12-03 DIAGNOSIS — I120 Hypertensive chronic kidney disease with stage 5 chronic kidney disease or end stage renal disease: Secondary | ICD-10-CM | POA: Diagnosis not present

## 2022-12-03 DIAGNOSIS — F332 Major depressive disorder, recurrent severe without psychotic features: Secondary | ICD-10-CM | POA: Diagnosis not present

## 2022-12-03 DIAGNOSIS — N049 Nephrotic syndrome with unspecified morphologic changes: Secondary | ICD-10-CM

## 2022-12-03 DIAGNOSIS — Z7901 Long term (current) use of anticoagulants: Secondary | ICD-10-CM | POA: Diagnosis not present

## 2022-12-03 DIAGNOSIS — E782 Mixed hyperlipidemia: Secondary | ICD-10-CM | POA: Insufficient documentation

## 2022-12-03 DIAGNOSIS — I4891 Unspecified atrial fibrillation: Secondary | ICD-10-CM | POA: Insufficient documentation

## 2022-12-03 DIAGNOSIS — I639 Cerebral infarction, unspecified: Secondary | ICD-10-CM | POA: Diagnosis not present

## 2022-12-03 DIAGNOSIS — E119 Type 2 diabetes mellitus without complications: Secondary | ICD-10-CM

## 2022-12-03 DIAGNOSIS — Z79899 Other long term (current) drug therapy: Secondary | ICD-10-CM | POA: Diagnosis not present

## 2022-12-03 DIAGNOSIS — Z794 Long term (current) use of insulin: Secondary | ICD-10-CM | POA: Diagnosis not present

## 2022-12-03 DIAGNOSIS — Z7902 Long term (current) use of antithrombotics/antiplatelets: Secondary | ICD-10-CM | POA: Insufficient documentation

## 2022-12-03 DIAGNOSIS — E1122 Type 2 diabetes mellitus with diabetic chronic kidney disease: Secondary | ICD-10-CM | POA: Diagnosis not present

## 2022-12-03 DIAGNOSIS — N185 Chronic kidney disease, stage 5: Secondary | ICD-10-CM | POA: Diagnosis not present

## 2022-12-03 DIAGNOSIS — F1910 Other psychoactive substance abuse, uncomplicated: Secondary | ICD-10-CM

## 2022-12-03 MED ORDER — ATORVASTATIN CALCIUM 80 MG PO TABS: 80.0000 mg | ORAL_TABLET | Freq: Every day | ORAL | 2 refills | Status: DC

## 2022-12-03 MED ORDER — CARVEDILOL 3.125 MG PO TABS: 3.1250 mg | ORAL_TABLET | Freq: Two times a day (BID) | ORAL | 1 refills | Status: DC

## 2022-12-03 MED ORDER — FUROSEMIDE 40 MG PO TABS: 40.0000 mg | ORAL_TABLET | Freq: Every day | ORAL | 2 refills | Status: DC

## 2022-12-03 MED ORDER — APIXABAN 2.5 MG PO TABS: 2.5000 mg | ORAL_TABLET | Freq: Two times a day (BID) | ORAL | 1 refills | Status: DC

## 2022-12-03 MED ORDER — AMLODIPINE BESYLATE 10 MG PO TABS: 10.0000 mg | ORAL_TABLET | Freq: Every day | ORAL | 1 refills | Status: DC

## 2022-12-03 MED ORDER — CITALOPRAM HYDROBROMIDE 10 MG PO TABS: 10.0000 mg | ORAL_TABLET | Freq: Every day | ORAL | 3 refills | Status: DC

## 2022-12-03 MED ORDER — CLOPIDOGREL BISULFATE 75 MG PO TABS: 75.0000 mg | ORAL_TABLET | Freq: Every day | ORAL | 2 refills | Status: DC

## 2022-12-03 NOTE — Patient Instructions (Signed)
Stop Wellbutrin.  Which is bupropion Stay on citalopram for depression They gave you a prescription for clopidogrel Plavix 1 daily stay on this medication All other medications were unchanged and refills for future were sent to your [facility]  Keep your follow-up appointments with the kidney specialist  I have asked the case manager to inquire about your home-based physical therapy  Continue to work on stopping cigarettes and avoiding all cocaine  Return to see Dr. Joya Gaskins 2 months

## 2022-12-03 NOTE — Assessment & Plan Note (Signed)
Remains in rate controlled continue apixaban

## 2022-12-03 NOTE — Assessment & Plan Note (Signed)
Diabetes seemingly well-controlled off all medications

## 2022-12-03 NOTE — Assessment & Plan Note (Signed)
Pontine stroke continue current therapy follow-up neurology

## 2022-12-03 NOTE — Assessment & Plan Note (Signed)
Pontine stroke continue supplemental therapy

## 2022-12-03 NOTE — Assessment & Plan Note (Signed)
Not well-controlled the patient is in end-stage renal failure will hold off on adjustments of medication

## 2022-12-03 NOTE — Addendum Note (Signed)
Addended by: Asencion Noble E on: 12/03/2022 01:11 PM   Modules accepted: Orders

## 2022-12-03 NOTE — Assessment & Plan Note (Signed)
Has been using cocaine states is abstinent now

## 2022-12-03 NOTE — Assessment & Plan Note (Signed)
Continue statin. 

## 2022-12-03 NOTE — Assessment & Plan Note (Signed)
Continue Celexa

## 2022-12-03 NOTE — Assessment & Plan Note (Signed)
Moving towards end-stage renal

## 2022-12-03 NOTE — Telephone Encounter (Signed)
I met with the patient when she was in the clinic today.  She had been referred to [facility] when she was discharged from the hospital; but has not heard from anyone since she has been home.  I called [facility] and spoke to Elfrida who said they do not have a referral but she will check on it. I explained this to the patient and we also discussed outpatient PT .  She said she prefers outpatient PT and would have transportation   I called Raquel Sarna back and told her to disregard the home health request.    The patient also stated that she has not heard from [facility] about the PCS.  I explained to her that we can submit another referral. Her Medicaid provider will contact her to schedule an in home assessment and will determine if she qualifies for services  If she does, she can then chose the agency that she would like to provide the PCS as long as they are  in network with her insurance plan.  She said she understood and was very Patent attorney.

## 2022-12-03 NOTE — Assessment & Plan Note (Signed)
End-stage renal disease followed by nephrology

## 2022-12-03 NOTE — Assessment & Plan Note (Signed)
Acute on chronic.

## 2022-12-04 NOTE — Telephone Encounter (Signed)
Completed PCS application faxed to [place] : [phone].

## 2022-12-09 ENCOUNTER — Ambulatory Visit: Payer: BC Managed Care – PPO | Attending: Physical Therapy | Admitting: Physical Therapy

## 2022-12-17 ENCOUNTER — Ambulatory Visit: Payer: BC Managed Care – PPO | Admitting: Physical Therapy

## 2022-12-25 ENCOUNTER — Ambulatory Visit: Payer: BC Managed Care – PPO | Admitting: Physical Therapy

## 2023-01-02 ENCOUNTER — Ambulatory Visit: Payer: Self-pay | Admitting: *Deleted

## 2023-01-02 NOTE — Telephone Encounter (Addendum)
My chart message  sent to patient.  Unable to reach or leave a message. Patient last activity on their myChart on 12/29/2022.   Good afternoon,  We received you message about you having problems with your Furosemide. I am sending the My chart message as I am unable to reach you on the numbers we have available in your chart.     I reached out to the provider and they would like you to follow-up with your nephrologist ,Cheral Marker, MD located at  [address], [place], [place] [address] Phone: [phone]. Please contact Dr. Marjory Lies for the Signs and symptoms you are having and for any medication changes need concerning your furosemide . Please if you have any additional questions or concerns call us at [phone].    Thank you,  Elsie Lincoln, RN,BSN Triage Nurse

## 2023-01-02 NOTE — Telephone Encounter (Signed)
Summary: concerns about Rx   Pt fiance reports that the furosemide (LASIX) 40 MG tablet is causing pt to sleep so hard that she is using the bathroom on herself. He requests that pt be contacted to discuss possible options. Cb# [phone]       Chief Complaint: "Too sleepy with Furosemide." Symptoms: Husband calling, pt present. Spoke to pt as well. States not waking up to to bathroom, "Sleeps right though and wets on herself."  Also reports incontinence of stool after taking meds, "Too slow and tired." Frequency: "In hospital when started Furosemide." Pertinent Negatives: Patient denies  Disposition: ED /[] Urgent Care (no appt availability in office) / Appointment(In office/virtual)/  [place] [facility]/ Home Care/ Refused Recommended Disposition /[] [place] [facility]/  Follow-up with PCP Additional Notes:  Confirmed twice pt and husband were referring to furosemide. States "That's what does it. They changed her whole med routine and she's too sleepy, can't do anything around the house." Asking for alternate med. Assured NT would route to practice for PCPs review.  Please advise.  Reason for Disposition  [1] Caller has URGENT medicine question about med that PCP or specialist prescribed AND [2] triager unable to answer question  Answer Assessment - Initial Assessment Questions 1. NAME of MEDICINE: "What medicine(s) are you calling about?"     Furosemide 2. QUESTION: "What is your question?" (e.g., double dose of medicine, side effect)     "Making me sleepy." 3. PRESCRIBER: "Who prescribed the medicine?" Reason: if prescribed by specialist, call should be referred to that group.      4. SYMPTOMS: "Do you have any symptoms?" If Yes, ask: "What symptoms are you having?"  "How bad are the symptoms (e.g., mild, moderate, severe)     Sleepy, "Wet on myself so tired I don;t wake up."  Protocols used: Medication Question Call-A-AH

## 2023-01-03 ENCOUNTER — Telehealth: Payer: Self-pay

## 2023-01-03 NOTE — Telephone Encounter (Signed)
Copied from CRM [id number]. Topic: General - Other >> Jan 03, 2023  3:13 PM Everette C wrote: Reason for CRM:  Miss Lola with [employer] has called to request that orders for a rollator and shower chair   Please contact further when possible

## 2023-01-08 DIAGNOSIS — D631 Anemia in chronic kidney disease: Secondary | ICD-10-CM | POA: Diagnosis not present

## 2023-01-08 DIAGNOSIS — I120 Hypertensive chronic kidney disease with stage 5 chronic kidney disease or end stage renal disease: Secondary | ICD-10-CM | POA: Diagnosis not present

## 2023-01-08 DIAGNOSIS — N185 Chronic kidney disease, stage 5: Secondary | ICD-10-CM | POA: Diagnosis not present

## 2023-01-08 DIAGNOSIS — N2581 Secondary hyperparathyroidism of renal origin: Secondary | ICD-10-CM | POA: Diagnosis not present

## 2023-01-09 NOTE — Telephone Encounter (Signed)
Called [name] unable to leave voicemail due to not having a mailbox number   For this request Stacey Lucas will to take care of this once he comes off of vacation .   Tired calling patient no answer or able to leave voicemail

## 2023-01-13 DIAGNOSIS — F332 Major depressive disorder, recurrent severe without psychotic features: Secondary | ICD-10-CM | POA: Diagnosis not present

## 2023-01-13 DIAGNOSIS — I639 Cerebral infarction, unspecified: Secondary | ICD-10-CM | POA: Diagnosis not present

## 2023-01-13 DIAGNOSIS — E119 Type 2 diabetes mellitus without complications: Secondary | ICD-10-CM | POA: Diagnosis not present

## 2023-01-14 ENCOUNTER — Telehealth: Payer: Self-pay | Admitting: Critical Care Medicine

## 2023-01-14 ENCOUNTER — Inpatient Hospital Stay: Payer: Medicaid Other | Admitting: Diagnostic Neuroimaging

## 2023-01-14 ENCOUNTER — Encounter: Payer: Self-pay | Admitting: Diagnostic Neuroimaging

## 2023-01-14 NOTE — Telephone Encounter (Signed)
[name] I received in the mail dated April 26 that Medicaid has denied [name]'s request for personal care services I put the paperwork on your desk and wondered your thoughts I suspect she will not get this approved

## 2023-01-21 ENCOUNTER — Ambulatory Visit: Payer: Self-pay | Admitting: Critical Care Medicine

## 2023-01-21 ENCOUNTER — Telehealth: Payer: Self-pay | Admitting: Critical Care Medicine

## 2023-01-21 DIAGNOSIS — Z8673 Personal history of transient ischemic attack (TIA), and cerebral infarction without residual deficits: Secondary | ICD-10-CM

## 2023-01-21 NOTE — Telephone Encounter (Signed)
Copied from CRM [id number]. Topic: General - Other >> Jan 21, 2023  1:32 PM Santiya F wrote: Reason for CRM: Lola with [facility] is calling in to check on the status of a request for the patient's rollator and shower bar. Lola says she sent the original request on 01/03/23. Please advise.

## 2023-01-23 NOTE — Telephone Encounter (Signed)
Yes, noted.

## 2023-01-23 NOTE — Telephone Encounter (Signed)
Stacey Lucas and [name] have never seen this request can you ask for them to send another order for the shower bar and rollator

## 2023-01-24 NOTE — Telephone Encounter (Signed)
Noted  

## 2023-01-31 ENCOUNTER — Other Ambulatory Visit: Payer: Self-pay

## 2023-01-31 NOTE — Progress Notes (Signed)
   Stacey Lucas 04/22/1976 [id number]  Patient states she is almost out of her medications and is needing more refills.  Patient outreached by Francetta Found , PharmD Candidate on 01/31/2023.  Blood Pressure Readings: Last documented ambulatory systolic blood pressure: 152 Last documented ambulatory diastolic blood pressure: 91 Does the patient have a validated home blood pressure machine?: Yes   Medication review was performed. Is the patient taking their medications as prescribed?: No Differences from their prescribed list include: Not taking Amlodipine or atorvastatin  The following barriers to adherence were noted: Does the patient have cost concerns?: Yes (Does not know why medications are so high, Has Medicaid) Does the patient have transportation concerns?: No Does the patient need assistance obtaining refills?: Yes Does the patient occassionally forget to take some of their prescribed medications?: No Does the patient feel like one/some of their medications make them feel poorly?: No Does the patient have questions or concerns about their medications?: No Does the patient have a follow up scheduled with their primary care provider/cardiologist?: Yes   Interventions: Interventions Completed: Medications were reviewed, Patient was educated on goal blood pressures and long term health implications of elevated blood pressure  The patient has follow up scheduled:  PCP: Storm Frisk, MD   Francetta Found, Student-PharmD

## 2023-02-02 NOTE — Progress Notes (Addendum)
Patient ID: Stacey Lucas, female   DOB: 01-07-1976, 47 y.o.   MRN: [id number]     Stacey Lucas, is a 47 y.o. female    Post hospital follow-up Subjective:     Stacey Lucas is a 47 y.o. female here today post hospital follow-up 12/03/22 Patient seen return follow-up and at the last visit patient had an acute stroke sent to the hospital for same.  Patient returns and needing outpatient physical therapy.  She had a pontine stroke.  Now in end-stage renal failure creatinine nearly 5.  She is followed by nephrology.  On arrival blood pressure 152/91.  She is on low-dose beta-blocker.  She still smoking 2 to 3 cigarettes daily.  She was using cocaine no longer doing so. Below is a copy of the discharge summary Name: Stacey Lucas MRN: [id number] DOB: 08-21-1976 47 y.o. PCP: Storm Frisk, MD   Date of Admission: 11/14/2022 10:01 AM Date of Discharge: 11/21/2022 Attending Physician: Reymundo Poll, MD   Discharge Diagnosis: 1. Active Problems:   Acute CVA (cerebrovascular accident) (HCC)   Nephrotic syndrome   Cerebrovascular accident (CVA) (HCC)   AKI (acute kidney injury) (HCC) T2DM HTN Hyperlipidemia Asymptomatic pyuria Anemia of chronic disease  L pontine CVA -lipid panel 3 months, LDL goal <70 secondary prevention -apixaban, no ASA or plavix -BP and diabetes optimization -neurology f/u outpatient   HTN -titrate carvedilol as needed, no BP meds that have GFR restriction   T2DM -repeat A1c in 3 months -no meds at discharge, titrate as needed   Nephrotic syndrome -eliquis 2.5 BID -lasix 40 daily -outpatient follow up with nephro -repeat cmp -f/u biopsy -check volume status   Anemia of CKD -follow up CBC -EPA every Saturday per nephrology   Polysubstance use -work on cessation   2. Labs/tests needed at follow up: renal function panel,CBC   3.  Pending labs/ test needing follow-up: Kidney biopsy results from 3/13    Hospital Course by problem list:    Acute left pontine CVA On 11/14/22 she received CT head for code stroke w/o contrast which demonstrated no acute large vessel infarct, hemorrhage, hydrocephalus or mass effect. Follow up MRI brain w/o contrast showed small acute left pontine CVA. Repeat imaging MRI angio head w/o contrast on 11/15/22 were same as previous MRI. She had dysmetria, dysarthria and new onset right side weakness that improved during her hospital stay with acute PT intervention. Will go home with [facility] PT[facility]. No DAPT given nephrotic syndrome. On Heparin ggt during admission transitioned to apixaban, ok per neurology. Transitioned from crestor 20 to atorvastatin 80 during admission, LDL found to be 99 above goal of <70 for secondary prevention. BP well controlled during admission. Diabetes well controlled during admission.Will follow up with neurology in the outpatient setting.   Nephrotic Syndrome CKD During her inpatient stay, patient had GFR ranging from 10-12, c/f AKI on CKD vs CKD V. On day 1, she had edema in her face and lower extremity. Per patient, she had been experiencing this swelling for a few weeks and it was increasingly harder to fit her shoes. Renal US with evidence of medically renal disease. Marland Kitchen She was worked up for nephrotic syndrome after having a serum protein gap with albumin <1.5. 24 hour protein >6 g. ANA,C3,C4,ANCA, myeoloma panel all unrevealing. She was started on lasix 40mg  and diuresed well. She appears euvolemicOn 3/13 she underwent a kidney biopsy to determine the etiology of her kidney dysfunction which is still pending, will be followed up by nephrology.Marland Kitchen  She has minimal soreness on the day post-op, no signs of swelling at the site, blood in urine or other complications.    Microcytic anemia Hgb of 9.8, MCV of 76.2 at admission.Ferritin wnl, iron wnl, TIBC low at 133 consistent with anemia of chronic disease.Hemoglobin stable at discharge at 8. On EPA every Saturday per nephrology.   HTN Patient was  hypertensive on her amlodipine 10mg  home dose while inpatient. She was started on coreg 3.125 mg BID which resulted in adequate control of her blood pressure during most of admission. She was counseled on the importance of taking both medications to mitigate impact to her renal dysfunction.    Chest Pain On 11/16/22 patient reported some intermittent chest pain that radiated to her neck. DVT and PE studies were negative. May have been uremic pericarditis. Pain resolved during admission.   T2DM Patient's H1ac was 5.6 during admission. She was managed with SSI while inpatient and received almost no short acting. Fasting glucose primarily <120.Patient was not taking glargine or glimepiride at home, discontinued at discharge.   Asymptomatic pyuria Patient denies suprapubic pain, dysuria, or any other urinary symptoms. UA with pyuria and bacteruria. With her being asymptotic do not believe etiology of her leukocytosis or renal dysfunction   Polysubstance use Endorses snorting cocaine weekly. She became tearful when discussing this. We discussed how this can lead to further strokes and even heart attacks.Encouraged cessation.TOC consulted.   Depression Continued home celexa and wellbutrin.   Leukocytosis On presentation WBC of 16.0, predominant left shift. Denies fever, chills, cough, shortness of breath or urinary symptoms. Xray without focal opacity. Unclear etiology of this. WBC 10.2 at discharge,afebrile, HDS, no evidence of infection throughout admission.    02/04/23 Patient seen in return follow-up and having right-sided muscle spasm involving the right arm and lower leg with associated pain.  She never got physical therapy as an outpatient.  She is smoking 2 cigarettes daily.  She has had no falls at this time.  Blood pressure is elevated on arrival 189/84.  She has followed with nephrology and is about to undergo end-stage renal disease hemodialysis.  She is having catheter access placed soon.   Records from nephrology show she has evidence of hyperparathyroidism assessment and treatment per nephrology The patient needs a rollator because she has difficulty navigating with a stationary walker also needs a shower stool because she cannot stand up to give her self baths Patient drags her right foot and would benefit from additional support of the right foot Problem  Gait Disturbance, Post-Stroke  Esrd (End Stage Renal Disease) (Hcc)  Cerebrovascular Accident (Cva) (Hcc)  Essential Hypertension     ALLERGIES: Allergies  Allergen Reactions   Gabapentin     Weakness/impairs balance    PAST MEDICAL HISTORY: 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 12/29/2021   Anxiety and depression    Cerebral thrombosis with cerebral infarction 05/24/2021   Cocaine abuse (HCC)    Depression with suicidal ideation    [facility] admission 04/2018   Diabetes mellitus without complication (HCC)    Type II   GERD (gastroesophageal reflux disease)    Hyperlipidemia    Hypertension    Impingement syndrome of right shoulder    Migraine headache    Osteoarthritis of right knee 05/24/2021   Pilonidal abscess 05/2013   Pyelonephritis    Right thyroid nodule    Sciatica  MEDICATIONS AT HOME: Prior to Admission medications   Medication Sig Start Date End Date Taking? Authorizing Provider  ACCU-CHEK GUIDE test strip USE UP TO FOUR TIMES DAILY AS DIRECTED 01/31/22  Yes [provider]  Accu-Chek Softclix Lancets lancets SMARTSIG:Topical 1-4 Times Daily 01/31/22  Yes [provider]  amLODipine (NORVASC) 10 MG tablet Take 1 tablet (10 mg total) by mouth daily. 04/18/22  Yes Storm Frisk, MD  aspirin EC 81 MG tablet Take 1 tablet (81 mg total) by mouth daily. Swallow whole. 04/18/22  Yes Storm Frisk, MD  azelastine (OPTIVAR) 0.05 % ophthalmic solution Place 1 drop into  both eyes 2 (two) times daily. 08/21/22  Yes Waldon Merl, PA-C  blood glucose meter kit and supplies KIT Dispense based on patient and insurance preference. Use up to four times daily as directed. 01/31/22  Yes Fanny Dance, MD  buPROPion (WELLBUTRIN XL) 150 MG 24 hr tablet TAKE 1 TABLET(150 MG) BY MOUTH DAILY 04/18/22  Yes Storm Frisk, MD  clopidogrel (PLAVIX) 75 MG tablet TAKE 1 TABLET(75 MG) BY MOUTH DAILY 05/15/22  Yes Storm Frisk, MD  diclofenac Sodium (VOLTAREN) 1 % GEL Apply 4 g topically daily as needed (For knee pain). 04/18/22  Yes Storm Frisk, MD  Insulin Glargine [facility] KWIKPEN) 100 UNIT/ML Inject 5 Units into the skin daily. 04/18/22  Yes Storm Frisk, MD  Insulin Pen Needle 32G X 4 MM MISC Use to inject insulin at bed time 04/18/22  Yes Storm Frisk, MD  meloxicam (MOBIC) 15 MG tablet TAKE 1 TABLET(15 MG) BY MOUTH DAILY 07/01/22  Yes Storm Frisk, MD  metoprolol succinate (TOPROL-XL) 25 MG 24 hr tablet TAKE 3 TABLETS(75 MG) BY MOUTH DAILY 08/16/22  Yes Storm Frisk, MD  rosuvastatin (CRESTOR) 20 MG tablet TAKE 1 TABLET(20 MG) BY MOUTH AT BEDTIME 11/05/22  Yes Storm Frisk, MD  citalopram (CELEXA) 10 MG tablet Take 1 tablet (10 mg total) by mouth daily. 11/14/22   Anders Simmonds, PA-C  glimepiride (AMARYL) 2 MG tablet Take 1 tablet (2 mg total) by mouth daily with breakfast. 11/14/22   Anders Simmonds, PA-C  irbesartan (AVAPRO) 75 MG tablet Take 1 tablet (75 mg total) by mouth daily. 11/14/22   Anders Simmonds, PA-C   Constitutional:   No  weight loss, night sweats,  Fevers, chills, fatigue, lassitude. HEENT:   No headaches,  Difficulty swallowing,  Tooth/dental problems,  Sore throat,                No sneezing, itching, ear ache, nasal congestion, post nasal drip,   CV:  No chest pain,  Orthopnea, PND, swelling in lower extremities, anasarca, dizziness, palpitations  GI  No heartburn, indigestion, abdominal pain, nausea, vomiting,  diarrhea, change in bowel habits, loss of appetite  Resp: No shortness of breath with exertion or at rest.  No excess mucus, no productive cough,  No non-productive cough,  No coughing up of blood.  No change in color of mucus.  No wheezing.  No chest wall deformity  Skin: no rash or lesions.  GU: no dysuria, change in color of urine, no urgency or frequency.  No flank pain.  MS:  No joint pain or swelling.  No decreased range of motion.  No back pain.  Psych:  No change in mood or affect. No depression or anxiety.  No memory loss.   Objective:   Vitals:   02/04/23 1442  BP: Marland Kitchen)  189/84  Pulse: 98  SpO2: 98%  Weight: 154 lb 12.8 oz (70.2 kg)     Exam  HEENT: Atraumatic and Normocephalic, pupils equally reactive to light and accomodation Neck: Supple, no JVD. No cervical lymphadenopathy.  Chest: Good air entry bilaterally, CTAB.  No rales/rhonchi/wheezing CVS: S1 S2 regular, no murmurs.  Extremities: B/L Lower Ext shows no edema, both legs are warm to touch Neurology: Awake alert, and oriented X 3, R face with mild droop, R sided weakness and delay with movement.  All of these neurologic findings improved Skin: No Rash  Data Review Lab Results  Component Value Date   HGBA1C 5.6 11/14/2022   HGBA1C 5.6 11/14/2022   HGBA1C 7.7 (H) 03/05/2022    Assessment & Plan  I personally reviewed all images and lab data in the [facility] system as well as any outside material available during this office visit and agree with the  radiology impressions.   Essential hypertension Hypertension not well-controlledContinue with amlodipine and carvedilol.  Not willing to increase medication with end-stage renal disease.  Patient needs hemodialysis.  Cerebrovascular accident (CVA) [facility]) Patient needs physical therapy for pain in the right side will refer Needs a rollator and shower stool  ESRD (end stage renal disease) (HCC) Needing dialysis per renal  Gait disturbance, post-stroke Physical  therapy referral was sent patient would benefit from some type of support of the right foot due to foot dragging   Stacey Lucas was seen today for hypertension, medication refill and right side body pain .  Diagnoses and all orders for this visit:  ESRD (end stage renal disease) (HCC)  Left pontine CVA (HCC) -     Ambulatory referral to Physical Therapy  Essential hypertension  Cerebrovascular accident (CVA), unspecified mechanism (HCC)  Gait disturbance, post-stroke  Other orders -     amLODipine (NORVASC) 10 MG tablet; Take 1 tablet (10 mg total) by mouth daily. -     carvedilol (COREG) 6.25 MG tablet; Take 0.5 tablets (3.125 mg total) by mouth 2 (two) times daily with a meal.     Return in about 3 months (around 05/07/2023) for htn.  Established Patient Office Visit  Subjective   Patient ID: Stacey Lucas, female    DOB: 1976-01-25  Age: 48 y.o. MRN: [id number]  Chief Complaint  Patient presents with   Hypertension   Medication Refill   Right side body pain     And some weakness    HPI    Review of Systems  Constitutional:  Negative for chills, diaphoresis, fever, malaise/fatigue and weight loss.  HENT:  Negative for congestion, hearing loss, nosebleeds, sore throat and tinnitus.   Eyes:  Negative for blurred vision, photophobia and redness.  Respiratory:  Negative for cough, hemoptysis, sputum production, shortness of breath, wheezing and stridor.   Cardiovascular:  Negative for chest pain, palpitations, orthopnea, claudication, leg swelling and PND.  Gastrointestinal:  Negative for abdominal pain, blood in stool, constipation, diarrhea, heartburn, nausea and vomiting.  Genitourinary:  Negative for dysuria, flank pain, frequency, hematuria and urgency.  Musculoskeletal:  Positive for myalgias. Negative for back pain, falls, joint pain and neck pain.  Skin:  Negative for itching and rash.  Neurological:  Positive for focal weakness and weakness. Negative for dizziness,  tingling, tremors, sensory change, speech change, seizures, loss of consciousness and headaches.  Endo/Heme/Allergies:  Negative for environmental allergies and polydipsia. Does not bruise/bleed easily.  Psychiatric/Behavioral:  Negative for depression, memory loss, substance abuse and suicidal ideas. The patient is not  nervous/anxious and does not have insomnia.       Objective:     BP (!) 189/84 (BP Location: Left Arm, Patient Position: Sitting, Cuff Size: Normal)   Pulse 98   Wt 154 lb 12.8 oz (70.2 kg)   LMP 10/11/2019   SpO2 98%   BMI 25.76 kg/m    Physical Exam Vitals reviewed.  Constitutional:      Appearance: Normal appearance. She is well-developed. She is not diaphoretic.  HENT:     Head: Normocephalic and atraumatic.     Nose: No nasal deformity, septal deviation, mucosal edema or rhinorrhea.     Right Sinus: No maxillary sinus tenderness or frontal sinus tenderness.     Left Sinus: No maxillary sinus tenderness or frontal sinus tenderness.     Mouth/Throat:     Pharynx: No oropharyngeal exudate.  Eyes:     General: No scleral icterus.    Conjunctiva/sclera: Conjunctivae normal.     Pupils: Pupils are equal, round, and reactive to light.  Neck:     Thyroid: No thyromegaly.     Vascular: No carotid bruit or JVD.     Trachea: Trachea normal. No tracheal tenderness or tracheal deviation.  Cardiovascular:     Rate and Rhythm: Normal rate and regular rhythm.     Chest Wall: PMI is not displaced.     Pulses: Normal pulses. No decreased pulses.     Heart sounds: Normal heart sounds, S1 normal and S2 normal. Heart sounds not distant. No murmur heard.    No systolic murmur is present.     No diastolic murmur is present.     No friction rub. No gallop. No S3 or S4 sounds.  Pulmonary:     Effort: No tachypnea, accessory muscle usage or respiratory distress.     Breath sounds: No stridor. No decreased breath sounds, wheezing, rhonchi or rales.  Chest:     Chest wall:  No tenderness.  Abdominal:     General: Bowel sounds are normal. There is no distension.     Palpations: Abdomen is soft. Abdomen is not rigid.     Tenderness: There is no abdominal tenderness. There is no guarding or rebound.  Musculoskeletal:        General: Normal range of motion.     Cervical back: Normal range of motion and neck supple. No edema, erythema or rigidity. No muscular tenderness. Normal range of motion.  Lymphadenopathy:     Head:     Right side of head: No submental or submandibular adenopathy.     Left side of head: No submental or submandibular adenopathy.     Cervical: No cervical adenopathy.  Skin:    General: Skin is warm and dry.     Coloration: Skin is not pale.     Findings: No rash.     Nails: There is no clubbing.  Neurological:     Mental Status: She is alert and oriented to person, place, and time. Mental status is at baseline.     Sensory: No sensory deficit.     Motor: Weakness present.     Gait: Gait abnormal.  Psychiatric:        Speech: Speech normal.        Behavior: Behavior normal.      No results found for any visits on 02/04/23.    The ASCVD Risk score (Arnett DK, et al., 2019) failed to calculate for the following reasons:   The patient has a prior MI  or stroke diagnosis    Assessment & Plan:   Problem List Items Addressed This Visit       Cardiovascular and Mediastinum   Essential hypertension    Hypertension not well-controlledContinue with amlodipine and carvedilol.  Not willing to increase medication with end-stage renal disease.  Patient needs hemodialysis.      Relevant Medications   amLODipine (NORVASC) 10 MG tablet   carvedilol (COREG) 6.25 MG tablet   Cerebrovascular accident (CVA) (HCC)    Patient needs physical therapy for pain in the right side will refer Needs a rollator and shower stool      Relevant Medications   amLODipine (NORVASC) 10 MG tablet   carvedilol (COREG) 6.25 MG tablet     Genitourinary    ESRD (end stage renal disease) (HCC) - Primary    Needing dialysis per renal        Other   Gait disturbance, post-stroke    Physical therapy referral was sent patient would benefit from some type of support of the right foot due to foot dragging      Other Visit Diagnoses     Left pontine CVA (HCC)       Relevant Medications   amLODipine (NORVASC) 10 MG tablet   carvedilol (COREG) 6.25 MG tablet   Other Relevant Orders   Ambulatory referral to Physical Therapy       Return in about 3 months (around 05/07/2023) for htn.    Shan Levans, MD

## 2023-02-03 DIAGNOSIS — F332 Major depressive disorder, recurrent severe without psychotic features: Secondary | ICD-10-CM | POA: Diagnosis not present

## 2023-02-03 DIAGNOSIS — I639 Cerebral infarction, unspecified: Secondary | ICD-10-CM | POA: Diagnosis not present

## 2023-02-03 DIAGNOSIS — E119 Type 2 diabetes mellitus without complications: Secondary | ICD-10-CM | POA: Diagnosis not present

## 2023-02-04 ENCOUNTER — Encounter: Payer: Self-pay | Admitting: Critical Care Medicine

## 2023-02-04 ENCOUNTER — Ambulatory Visit: Payer: Medicaid Other | Attending: Critical Care Medicine | Admitting: Critical Care Medicine

## 2023-02-04 VITALS — BP 189/84 | HR 98 | Wt 154.8 lb

## 2023-02-04 DIAGNOSIS — I639 Cerebral infarction, unspecified: Secondary | ICD-10-CM

## 2023-02-04 DIAGNOSIS — I69398 Other sequelae of cerebral infarction: Secondary | ICD-10-CM

## 2023-02-04 DIAGNOSIS — I10 Essential (primary) hypertension: Secondary | ICD-10-CM | POA: Diagnosis not present

## 2023-02-04 DIAGNOSIS — R269 Unspecified abnormalities of gait and mobility: Secondary | ICD-10-CM

## 2023-02-04 DIAGNOSIS — Z992 Dependence on renal dialysis: Secondary | ICD-10-CM | POA: Insufficient documentation

## 2023-02-04 DIAGNOSIS — N186 End stage renal disease: Secondary | ICD-10-CM | POA: Diagnosis not present

## 2023-02-04 DIAGNOSIS — E119 Type 2 diabetes mellitus without complications: Secondary | ICD-10-CM | POA: Diagnosis not present

## 2023-02-04 DIAGNOSIS — F332 Major depressive disorder, recurrent severe without psychotic features: Secondary | ICD-10-CM | POA: Diagnosis not present

## 2023-02-04 MED ORDER — AMLODIPINE BESYLATE 10 MG PO TABS: 10.0000 mg | ORAL_TABLET | Freq: Every day | ORAL | 1 refills | Status: DC

## 2023-02-04 MED ORDER — CARVEDILOL 6.25 MG PO TABS: 3.1250 mg | ORAL_TABLET | Freq: Two times a day (BID) | ORAL | 1 refills | Status: DC

## 2023-02-04 NOTE — Assessment & Plan Note (Signed)
Hypertension not well-controlledContinue with amlodipine and carvedilol.  Not willing to increase medication with end-stage renal disease.  Patient needs hemodialysis.

## 2023-02-04 NOTE — Patient Instructions (Signed)
Continue to follow-up with the kidney doctor  Resume amlodipine 1 pill daily and stay on carvedilol 1 twice daily as you are taking  Continue to work on reducing tobacco intake  Physical therapy referral was made again  Return to Dr. Delford Field 3 months

## 2023-02-04 NOTE — Assessment & Plan Note (Signed)
Needing dialysis per renal

## 2023-02-04 NOTE — Assessment & Plan Note (Addendum)
Patient needs physical therapy for pain in the right side will refer Needs a rollator and shower stool

## 2023-02-10 DIAGNOSIS — I639 Cerebral infarction, unspecified: Secondary | ICD-10-CM | POA: Diagnosis not present

## 2023-02-10 DIAGNOSIS — F332 Major depressive disorder, recurrent severe without psychotic features: Secondary | ICD-10-CM | POA: Diagnosis not present

## 2023-02-10 DIAGNOSIS — E119 Type 2 diabetes mellitus without complications: Secondary | ICD-10-CM | POA: Diagnosis not present

## 2023-02-11 DIAGNOSIS — E119 Type 2 diabetes mellitus without complications: Secondary | ICD-10-CM | POA: Diagnosis not present

## 2023-02-11 DIAGNOSIS — I639 Cerebral infarction, unspecified: Secondary | ICD-10-CM | POA: Diagnosis not present

## 2023-02-11 DIAGNOSIS — F332 Major depressive disorder, recurrent severe without psychotic features: Secondary | ICD-10-CM | POA: Diagnosis not present

## 2023-02-11 NOTE — Telephone Encounter (Signed)
Pt is calling to follow up on the status of her rollator and shower bar. Asked if Dr. Delford Field had sent in an order.  Please advise.

## 2023-02-12 DIAGNOSIS — E119 Type 2 diabetes mellitus without complications: Secondary | ICD-10-CM | POA: Diagnosis not present

## 2023-02-12 DIAGNOSIS — F332 Major depressive disorder, recurrent severe without psychotic features: Secondary | ICD-10-CM | POA: Diagnosis not present

## 2023-02-12 DIAGNOSIS — I639 Cerebral infarction, unspecified: Secondary | ICD-10-CM | POA: Diagnosis not present

## 2023-02-12 NOTE — Telephone Encounter (Signed)
She had a walker at last visit soing well i feel certain I addressed this months ago

## 2023-02-12 NOTE — Telephone Encounter (Signed)
I called the patient : [phone] to obtain more information about her DME request. Message left with call back requested.

## 2023-02-13 ENCOUNTER — Telehealth: Payer: Self-pay | Admitting: Critical Care Medicine

## 2023-02-13 DIAGNOSIS — F332 Major depressive disorder, recurrent severe without psychotic features: Secondary | ICD-10-CM | POA: Diagnosis not present

## 2023-02-13 DIAGNOSIS — E119 Type 2 diabetes mellitus without complications: Secondary | ICD-10-CM | POA: Diagnosis not present

## 2023-02-13 DIAGNOSIS — I639 Cerebral infarction, unspecified: Secondary | ICD-10-CM | POA: Diagnosis not present

## 2023-02-13 NOTE — Telephone Encounter (Signed)
This is addressed in another telephone encounter from today

## 2023-02-13 NOTE — Telephone Encounter (Signed)
Orders on printer , sign for me

## 2023-02-13 NOTE — Telephone Encounter (Signed)
Pt was returning Robyne Peers call back. Please call pt back to get info needed for her DME

## 2023-02-13 NOTE — Telephone Encounter (Signed)
I called the patient again and she confirmed that she has a walker but would like a rollator ( walker with a seat) because she fatigues easily.  She is also requesting a shower chair for the same reason that she fatigues easily.  She thought these items were addressed at her last appointment   The orders can be sent to [facility].

## 2023-02-13 NOTE — Telephone Encounter (Signed)
Orders for shower chair and rollator faxed to [facility]

## 2023-02-13 NOTE — Addendum Note (Signed)
Addended by: Shan Levans E on: 02/13/2023 04:29 PM   Modules accepted: Orders

## 2023-02-14 DIAGNOSIS — E119 Type 2 diabetes mellitus without complications: Secondary | ICD-10-CM | POA: Diagnosis not present

## 2023-02-14 DIAGNOSIS — F332 Major depressive disorder, recurrent severe without psychotic features: Secondary | ICD-10-CM | POA: Diagnosis not present

## 2023-02-14 DIAGNOSIS — I639 Cerebral infarction, unspecified: Secondary | ICD-10-CM | POA: Diagnosis not present

## 2023-02-15 ENCOUNTER — Ambulatory Visit: Payer: 59 | Admitting: Physical Therapy

## 2023-02-15 DIAGNOSIS — I639 Cerebral infarction, unspecified: Secondary | ICD-10-CM | POA: Diagnosis not present

## 2023-02-15 DIAGNOSIS — F332 Major depressive disorder, recurrent severe without psychotic features: Secondary | ICD-10-CM | POA: Diagnosis not present

## 2023-02-15 DIAGNOSIS — E119 Type 2 diabetes mellitus without complications: Secondary | ICD-10-CM | POA: Diagnosis not present

## 2023-02-16 DIAGNOSIS — E119 Type 2 diabetes mellitus without complications: Secondary | ICD-10-CM | POA: Diagnosis not present

## 2023-02-16 DIAGNOSIS — F332 Major depressive disorder, recurrent severe without psychotic features: Secondary | ICD-10-CM | POA: Diagnosis not present

## 2023-02-16 DIAGNOSIS — I639 Cerebral infarction, unspecified: Secondary | ICD-10-CM | POA: Diagnosis not present

## 2023-02-17 DIAGNOSIS — I639 Cerebral infarction, unspecified: Secondary | ICD-10-CM | POA: Diagnosis not present

## 2023-02-17 DIAGNOSIS — E119 Type 2 diabetes mellitus without complications: Secondary | ICD-10-CM | POA: Diagnosis not present

## 2023-02-17 DIAGNOSIS — F332 Major depressive disorder, recurrent severe without psychotic features: Secondary | ICD-10-CM | POA: Diagnosis not present

## 2023-02-18 DIAGNOSIS — I639 Cerebral infarction, unspecified: Secondary | ICD-10-CM | POA: Diagnosis not present

## 2023-02-18 DIAGNOSIS — F332 Major depressive disorder, recurrent severe without psychotic features: Secondary | ICD-10-CM | POA: Diagnosis not present

## 2023-02-18 DIAGNOSIS — E119 Type 2 diabetes mellitus without complications: Secondary | ICD-10-CM | POA: Diagnosis not present

## 2023-02-19 ENCOUNTER — Encounter: Payer: Self-pay | Admitting: Physical Therapy

## 2023-02-19 ENCOUNTER — Ambulatory Visit: Payer: 59 | Attending: Critical Care Medicine | Admitting: Physical Therapy

## 2023-02-19 ENCOUNTER — Other Ambulatory Visit: Payer: Self-pay

## 2023-02-19 VITALS — BP 119/77 | HR 78

## 2023-02-19 DIAGNOSIS — I639 Cerebral infarction, unspecified: Secondary | ICD-10-CM | POA: Diagnosis not present

## 2023-02-19 DIAGNOSIS — R2689 Other abnormalities of gait and mobility: Secondary | ICD-10-CM | POA: Diagnosis not present

## 2023-02-19 DIAGNOSIS — F332 Major depressive disorder, recurrent severe without psychotic features: Secondary | ICD-10-CM | POA: Diagnosis not present

## 2023-02-19 DIAGNOSIS — M6281 Muscle weakness (generalized): Secondary | ICD-10-CM | POA: Insufficient documentation

## 2023-02-19 DIAGNOSIS — R2681 Unsteadiness on feet: Secondary | ICD-10-CM | POA: Diagnosis not present

## 2023-02-19 DIAGNOSIS — E119 Type 2 diabetes mellitus without complications: Secondary | ICD-10-CM | POA: Diagnosis not present

## 2023-02-19 NOTE — Therapy (Signed)
OUTPATIENT PHYSICAL THERAPY NEURO EVALUATION   Patient Name: Stacey Lucas MRN: [id number] DOB:[birth year], 47 y.o., female Today's Date: 02/19/2023   PCP: Storm Frisk, MD REFERRING PROVIDER:Wright, Charlcie Cradle, MD   END OF SESSION:  PT End of Session - 02/19/23 1157     Visit Number 1    Number of Visits 13    Date for PT Re-Evaluation 04/02/23    Authorization Type [employer] MCD    Authorization Time Period 02/19/23 to 04/02/23    Authorization - Number of Visits 12    PT Start Time 1114   pt late   PT Stop Time 1142    PT Time Calculation (min) 28 min    Activity Tolerance Patient tolerated treatment well    Behavior During Therapy [facility] for tasks assessed/performed             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 12/29/2021   Anxiety and depression    Cerebral thrombosis with cerebral infarction 05/24/2021   Cocaine abuse (HCC)    Depression with suicidal ideation    [facility]   Diabetes mellitus without complication (HCC)    Type II   GERD (gastroesophageal reflux disease)    Hyperlipidemia    Hypertension    Impingement syndrome of right shoulder    Migraine headache    Osteoarthritis of right knee 05/24/2021   Pilonidal abscess 05/2013   Pyelonephritis    Right thyroid nodule    Sciatica    Past Surgical History:  Procedure Laterality Date   BUBBLE STUDY  01/01/2022   Procedure: BUBBLE STUDY;  Surgeon: Maisie Fus, MD;  Location: [facility] ENDOSCOPY;  Service: Cardiovascular;;   TEE WITHOUT CARDIOVERSION N/A 01/01/2022   Procedure: TRANSESOPHAGEAL ECHOCARDIOGRAM (TEE);  Surgeon: Maisie Fus, MD;  Location: [facility] ENDOSCOPY;  Service: Cardiovascular;  Laterality: N/A;   tubal     Patient Active Problem List   Diagnosis Date Noted   ESRD (end stage renal disease) (HCC) 02/04/2023   Polysubstance abuse (HCC) 12/03/2022   Nephrotic  syndrome 11/15/2022   Cerebrovascular accident (CVA) (HCC) 11/15/2022   Anemia 01/16/2022   Atrial fibrillation (HCC) 07/04/2021   H/O: stroke 07/04/2021   Osteoarthritis of right knee 05/24/2021   Type 2 diabetes mellitus with other specified complication, without long-term current use of insulin (HCC) 05/23/2021   Essential hypertension 05/23/2021   Hyperlipidemia 05/23/2021   Thyroid nodule 05/23/2021   MDD (major depressive disorder), recurrent severe, without psychosis (HCC) 04/27/2018    ONSET DATE: Pontine CVA found on imaging early March 2024  REFERRING DIAG: I63.9 (ICD-10-CM) - Left pontine CVA (HCC)  THERAPY DIAG:  Muscle weakness (generalized)  Other abnormalities of gait and mobility  Unsteadiness on feet  Rationale for Evaluation and Treatment: Rehabilitation  SUBJECTIVE:  SUBJECTIVE STATEMENT:  This stroke happened in early March, I didn't even know I was having a stroke but I was at the doctor's office and they sent me to the ED. I've had 3 strokes total, the first one was in 2023. I have a lot of trouble with my balance anyway, no falls but I tend to get very unsteady walking without anything.   Pt accompanied by: self  PERTINENT HISTORY: Corona radiata/basal ganglia infarct 2023, anxiety/depression, cerebral thrombosis with cerebral infarction 2022, hx cocaine abuse, hx depression with suicidal ideation, DM, HLD, HTN, migraine, R shoulder impingement, sciatica,   PAIN:  Are you having pain? Yes: NPRS scale: 6/10 Pain location: R side of body  Pain description: sharp, like an ice pick up and down my body  Aggravating factors: nothing  Relieving factors: nothing   PRECAUTIONS: Fall, impaired vision after CVA in 2023, watch BP   WEIGHT BEARING RESTRICTIONS: No  FALLS: Has  patient fallen in last 6 months? No, but does have fear of falling   LIVING ENVIRONMENT: Lives with: lives with their partner Lives in: House/apartment Stairs: No Has following equipment at home: Environmental consultant - 2 wheeled, Environmental consultant - 4 wheeled, and bedside commode, also trying to get a shower seat   PLOF: Independent with household mobility with device, Independent with gait, Independent with transfers, Requires assistive device for independence, Needs assistance with ADLs, and Needs assistance with homemaking  PATIENT GOALS: be able to walk without the walker   OBJECTIVE:   DIAGNOSTIC FINDINGS: EXAM: MRI HEAD WITHOUT CONTRAST   TECHNIQUE: Multiplanar, multiecho pulse sequences of the brain and surrounding structures were obtained without intravenous contrast.   COMPARISON:  Same day CT head.   FINDINGS: Brain: Small acute infarct in the left pons. No significant edema or mass effect. No evidence of acute hemorrhage, mass lesion, midline shift or hydrocephalus. Patchy white matter T2/FLAIR hyperintensities, nonspecific but compatible with chronic microvascular ischemic disease.   Vascular: Major arterial flow voids are maintained at the skull base.   Skull and upper cervical spine: Normal marrow signal.   Sinuses/Orbits: Mild paranasal sinus mucosal thickening. Remote left medial orbital wall fracture.   Other: No sizable mastoid effusions.   IMPRESSION: Small acute infarct in the left pons.  COGNITION: Overall cognitive status: Within functional limits for tasks assessed   SENSATION: Reports numbness bottom of R foot (unclear if this is new or chronic since first CVA in 2023)  COORDINATION: RAM testing impaired R UE and R LE       LOWER EXTREMITY MMT:    MMT Right Eval Left Eval  Hip flexion 3 3  Hip extension    Hip abduction    Hip adduction    Hip internal rotation    Hip external rotation    Knee flexion    Knee extension 4- at best  4+  Ankle  dorsiflexion 3 3- at best   Ankle plantarflexion    Ankle inversion    Ankle eversion    (Blank rows = not tested)     GAIT: Gait pattern: step through pattern, decreased arm swing- Right, decreased arm swing- Left, decreased hip/knee flexion- Right, decreased hip/knee flexion- Left, decreased ankle dorsiflexion- Right, decreased ankle dorsiflexion- Left, trunk flexed, narrow BOS, poor foot clearance- Right, and poor foot clearance- Left Distance walked: 30-82ft Assistive device utilized: Walker - 4 wheeled Level of assistance: Modified independence Comments: B foot drop noted/poor ankle DF. Very easily fatigued with gait   FUNCTIONAL TESTS:  5  times sit to stand: 38 seconds using BUEs, RPE 6/10 Timed up and go (TUG): 24.5 seconds rollator RPE 2/10 Able to ambulate 65ft with rollator before needing rest break, RPE 4/10   TODAY'S TREATMENT:                                                                                                                              DATE:   Eval  Objective measures, care planning, appropriate education   BP at beginning of session 116/76 HR 74 BP at end of session 119/77 HR 78   PATIENT EDUCATION: Education details: exam findings, POC; unfortunately pain from pontine CVA is normal for stroke in this area, PT to focus on the functional instead  Person educated: Patient Education method: Explanation Education comprehension: verbalized understanding, returned demonstration, and needs further education  HOME EXERCISE PROGRAM:  Will give second session, eval limited   GOALS: Goals reviewed with patient? No  SHORT TERM GOALS: Target date: 03/12/2023    Will be compliant with appropriate progressive HEP  Baseline: Goal status: INITIAL  2.  Will be able to ambulate 141ft with LRAD with no rest breaks and  RPE no more than 2/10 Baseline:  Goal status: INITIAL  3.  Will trial appropriate AFOs vs Foot Up brace to address B foot drop/improve  gait pattern and energy conservation Baseline:  Goal status: INITIAL  4.  Will be able to name 3 ways to reduce fall risk at home and in the community  Baseline:  Goal status: INITIAL    LONG TERM GOALS: Target date: 04/02/2023    MMT to improve by 1 grade in all weak groups  Baseline:  Goal status: INITIAL  2.  Will complete 5x STS test in 25 seconds or less with UEs to show improved functional transfers/functional strength  Baseline:  Goal status: INITIAL  3.  Will complete TUG test in 15 seconds or less with LRAD to show improved functional balance skills  Baseline:  Goal status: INITIAL  4.  Will be able to ambulate 257ft with LRAD with no rest breaks and RPE no more than 3/10 to improve community access  Baseline:  Goal status: INITIAL    ASSESSMENT:  CLINICAL IMPRESSION: Patient is a 47 y.o. F who was seen today for physical therapy evaluation and treatment for skilled care following Pontine CVA in March 2024. Of note she has had several strokes within the past year; also evaluation was limited as she arrived late today. BP WNL in session today, but does have hx of elevated pressures. Exam reveals significant functional muscle weakness, severe gait impairment with B foot drop, poor functional balance, poor functional activity tolerance, impaired coordination, and high fall risk/poor safety awareness. Will benefit from skilled PT services to address all impairments moving forward.    OBJECTIVE IMPAIRMENTS: Abnormal gait, cardiopulmonary status limiting activity, decreased activity tolerance, decreased balance, decreased coordination, decreased mobility, difficulty walking, decreased strength, impaired sensation, impaired vision/preception,  and pain.   ACTIVITY LIMITATIONS: standing, stairs, transfers, bed mobility, and locomotion level  PARTICIPATION LIMITATIONS: shopping, community activity, yard work, and church  PERSONAL FACTORS: Age, Behavior pattern, Education,  Fitness, Past/current experiences, Social background, and Time since onset of injury/illness/exacerbation are also affecting patient's functional outcome.   REHAB POTENTIAL: Fair hx of recurrent strokes, hx of high no-show rate  CLINICAL DECISION MAKING: Evolving/moderate complexity  EVALUATION COMPLEXITY: Moderate  PLAN:  PT FREQUENCY: 2x/week  PT DURATION: 6 weeks  PLANNED INTERVENTIONS: Therapeutic exercises, Therapeutic activity, Neuromuscular re-education, Balance training, Gait training, Patient/Family education, Self Care, Stair training, Orthotic/Fit training, DME instructions, Aquatic Therapy, and Re-evaluation  PLAN FOR NEXT SESSION: trial AFOs vs foot up brace, focus on the functional- strength/balance/safety/functional activity tolerance     Nedra Hai PT [name] PN2

## 2023-02-20 ENCOUNTER — Other Ambulatory Visit: Payer: Self-pay

## 2023-02-20 ENCOUNTER — Emergency Department (HOSPITAL_COMMUNITY): Payer: 59

## 2023-02-20 ENCOUNTER — Emergency Department (HOSPITAL_COMMUNITY)
Admission: EM | Admit: 2023-02-20 | Discharge: 2023-02-20 | Discharge status: Against Medical Advice | Payer: 59 | Attending: Emergency Medicine | Admitting: Emergency Medicine

## 2023-02-20 ENCOUNTER — Encounter (HOSPITAL_COMMUNITY): Payer: Self-pay | Admitting: Emergency Medicine

## 2023-02-20 ENCOUNTER — Ambulatory Visit: Payer: Self-pay

## 2023-02-20 DIAGNOSIS — R109 Unspecified abdominal pain: Secondary | ICD-10-CM | POA: Insufficient documentation

## 2023-02-20 DIAGNOSIS — E119 Type 2 diabetes mellitus without complications: Secondary | ICD-10-CM | POA: Diagnosis not present

## 2023-02-20 DIAGNOSIS — R079 Chest pain, unspecified: Secondary | ICD-10-CM | POA: Diagnosis not present

## 2023-02-20 DIAGNOSIS — N189 Chronic kidney disease, unspecified: Secondary | ICD-10-CM | POA: Insufficient documentation

## 2023-02-20 DIAGNOSIS — F332 Major depressive disorder, recurrent severe without psychotic features: Secondary | ICD-10-CM | POA: Diagnosis not present

## 2023-02-20 DIAGNOSIS — I10 Essential (primary) hypertension: Secondary | ICD-10-CM | POA: Diagnosis not present

## 2023-02-20 DIAGNOSIS — M545 Low back pain, unspecified: Secondary | ICD-10-CM | POA: Diagnosis not present

## 2023-02-20 DIAGNOSIS — R0789 Other chest pain: Secondary | ICD-10-CM | POA: Insufficient documentation

## 2023-02-20 DIAGNOSIS — R112 Nausea with vomiting, unspecified: Secondary | ICD-10-CM | POA: Diagnosis not present

## 2023-02-20 DIAGNOSIS — Z5321 Procedure and treatment not carried out due to patient leaving prior to being seen by health care provider: Secondary | ICD-10-CM | POA: Diagnosis not present

## 2023-02-20 DIAGNOSIS — I639 Cerebral infarction, unspecified: Secondary | ICD-10-CM | POA: Diagnosis not present

## 2023-02-20 DIAGNOSIS — M549 Dorsalgia, unspecified: Secondary | ICD-10-CM | POA: Diagnosis not present

## 2023-02-20 LAB — CBC
HCT: 28.2 % — ABNORMAL LOW (ref 36.0–46.0)
Hemoglobin: 9 g/dL — ABNORMAL LOW (ref 12.0–15.0)
MCH: 23.9 pg — ABNORMAL LOW (ref 26.0–34.0)
MCHC: 31.9 g/dL (ref 30.0–36.0)
MCV: 74.8 fL — ABNORMAL LOW (ref 80.0–100.0)
Platelets: 273 10*3/uL (ref 150–400)
RBC: 3.77 MIL/uL — ABNORMAL LOW (ref 3.87–5.11)
RDW: 16.5 % — ABNORMAL HIGH (ref 11.5–15.5)
WBC: 9.8 10*3/uL (ref 4.0–10.5)
nRBC: 0 % (ref 0.0–0.2)

## 2023-02-20 LAB — BASIC METABOLIC PANEL
Anion gap: 9 (ref 5–15)
BUN: 60 mg/dL — ABNORMAL HIGH (ref 6–20)
CO2: 17 mmol/L — ABNORMAL LOW (ref 22–32)
Calcium: 8.3 mg/dL — ABNORMAL LOW (ref 8.9–10.3)
Chloride: 114 mmol/L — ABNORMAL HIGH (ref 98–111)
Creatinine, Ser: 7.85 mg/dL — ABNORMAL HIGH (ref 0.44–1.00)
GFR, Estimated: 6 mL/min — ABNORMAL LOW (ref >60)
Glucose, Bld: 80 mg/dL (ref 70–99)
Potassium: 3.6 mmol/L (ref 3.5–5.1)
Sodium: 140 mmol/L (ref 135–145)

## 2023-02-20 LAB — LIPASE, BLOOD: Lipase: 26 U/L (ref 11–51)

## 2023-02-20 LAB — HEPATIC FUNCTION PANEL
ALT: 8 U/L (ref 0–44)
AST: 14 U/L — ABNORMAL LOW (ref 15–41)
Albumin: 1.6 g/dL — ABNORMAL LOW (ref 3.5–5.0)
Alkaline Phosphatase: 49 U/L (ref 38–126)
Bilirubin, Direct: <0.1 mg/dL (ref 0.0–0.2)
Total Bilirubin: 0.6 mg/dL (ref 0.3–1.2)
Total Protein: 4.9 g/dL — ABNORMAL LOW (ref 6.5–8.1)

## 2023-02-20 LAB — I-STAT BETA HCG BLOOD, ED (MC, WL, AP ONLY): I-stat hCG, quantitative: 15.3 m[IU]/mL — ABNORMAL HIGH (ref <5)

## 2023-02-20 LAB — TROPONIN I (HIGH SENSITIVITY)
Troponin I (High Sensitivity): 15 ng/L (ref <18)
Troponin I (High Sensitivity): 15 ng/L (ref <18)

## 2023-02-20 LAB — HCG, QUANTITATIVE, PREGNANCY: hCG, Beta Chain, Quant, S: 5 m[IU]/mL — ABNORMAL HIGH (ref <5)

## 2023-02-20 NOTE — ED Triage Notes (Signed)
Pt BIB EMS from home for left sided CP and lower back pain. Patient reports she has been told she is in renal failure but has not started dialysis. States she has urinated today but is only able to urinate a small amount. C/o NV. 324 ASA given by EMS. 190/110 HR 86 100% RA RR 20 CBG 96

## 2023-02-20 NOTE — ED Notes (Signed)
Pt unable to urinate at this time.  

## 2023-02-20 NOTE — ED Provider Triage Note (Signed)
Emergency Medicine Provider Triage Evaluation Note  Stacey Lucas , a 47 y.o. female  was evaluated in triage.  Pt complains of right flank pain and chest tightness that started 3 days ago.  She states that she has been diagnosed with CKD and is worried her kidney function is worsening.  Today she had a couple episodes of nonbloody emesis.  No associated fever, lightheadedness, dizziness, diarrhea, other abdominal pain or other complaints.  No personal history ACS.  Review of Systems  Positive: See HPI Negative: See HPI  Physical Exam  BP (!) 182/90   Pulse 84   Temp 98.3 F (36.8 C)   Resp 18   Ht 5\' 5"  (1.651 m)   Wt 68 kg   LMP 10/11/2019   SpO2 100%   BMI 24.96 kg/m  Gen:   Awake, no distress, on entry to the exam room patient is rapidly texting on her phone without any signs of acute distress Resp:  Normal effort lungs clear to auscultation MSK:   Moves extremities without difficulty no lower extremity edema  Other:  Regular rate and rhythm, abdomen soft and nontender, no CVA tenderness, patient nontoxic-appearing  Medical Decision Making  Medically screening exam initiated at 5:07 PM.  Appropriate orders placed.  Stacey Lucas was informed that the remainder of the evaluation will be completed by another provider, this initial triage assessment does not replace that evaluation, and the importance of remaining in the ED until their evaluation is complete.     Tonette Lederer, PA-C 02/20/23 1742

## 2023-02-20 NOTE — Telephone Encounter (Signed)
  Chief Complaint: Vomiting 1x, back pain, facial swelling Symptoms: above Frequency: Vomiting 1x last night, Other s/s ongoing Pertinent Negatives: Patient denies  Disposition: [] ED /[] Urgent Care (no appt availability in office) / [] Appointment(In office/virtual)/ []  [facility]/ [] Home Care/ [] Refused Recommended Disposition /[] [facility]/ [x]  Follow-up with PCP Additional Notes: Pt vomited last night 1 times. Pt states that the other s/s are d/t kidney failure. Pt has not followed up with specialist. Pt states she has called them without response. Verified number with pt. Pt states that she is not sure if the  number I gave her is number she has been calling. Pt will call number provided. Pt does not want to go to the hospital. Pt will call back if she is unable to get her nephrologist appt. Pt will most likely go to ED.  Please advise.   Reason for Disposition  MILD vomiting with diarrhea  Answer Assessment - Initial Assessment Questions 1. VOMITING SEVERITY: "How many times have you vomited in the past 24 hours?"     - MILD:  1 - 2 times/day    - MODERATE: 3 - 5 times/day, decreased oral intake without significant weight loss or symptoms of dehydration    - SEVERE: 6 or more times/day, vomits everything or nearly everything, with significant weight loss, symptoms of dehydration      1x 2. ONSET: "When did the vomiting begin?"      Last night 3. FLUIDS: "What fluids or food have you vomited up today?" "Have you been able to keep any fluids down?"     Has not tried 4. ABDOMEN PAIN: "Are your having any abdomen pain?" If Yes : "How bad is it and what does it feel like?" (e.g., crampy, dull, intermittent, constant)      Back pain 5. DIARRHEA: "Is there any diarrhea?" If Yes, ask: "How many times today?"      no 7. CAUSE: "What do you think is causing your vomiting?"     Kidney 9. OTHER SYMPTOMS: "Do you have any other symptoms?" (e.g., fever, headache, vertigo,  vomiting blood or coffee grounds, recent head injury)     Nausea, back pain, facial swelling  Protocols used: Vomiting-A-AH

## 2023-02-22 DIAGNOSIS — F332 Major depressive disorder, recurrent severe without psychotic features: Secondary | ICD-10-CM | POA: Diagnosis not present

## 2023-02-22 DIAGNOSIS — I639 Cerebral infarction, unspecified: Secondary | ICD-10-CM | POA: Diagnosis not present

## 2023-02-22 DIAGNOSIS — E119 Type 2 diabetes mellitus without complications: Secondary | ICD-10-CM | POA: Diagnosis not present

## 2023-02-23 DIAGNOSIS — E119 Type 2 diabetes mellitus without complications: Secondary | ICD-10-CM | POA: Diagnosis not present

## 2023-02-23 DIAGNOSIS — F332 Major depressive disorder, recurrent severe without psychotic features: Secondary | ICD-10-CM | POA: Diagnosis not present

## 2023-02-23 DIAGNOSIS — I639 Cerebral infarction, unspecified: Secondary | ICD-10-CM | POA: Diagnosis not present

## 2023-02-24 ENCOUNTER — Encounter: Payer: Self-pay | Admitting: Diagnostic Neuroimaging

## 2023-02-24 ENCOUNTER — Ambulatory Visit (INDEPENDENT_AMBULATORY_CARE_PROVIDER_SITE_OTHER): Payer: Medicaid Other | Admitting: Diagnostic Neuroimaging

## 2023-02-24 ENCOUNTER — Telehealth: Payer: Self-pay | Admitting: Critical Care Medicine

## 2023-02-24 VITALS — BP 146/76 | HR 89 | Ht 65.0 in | Wt 154.8 lb

## 2023-02-24 DIAGNOSIS — N186 End stage renal disease: Secondary | ICD-10-CM

## 2023-02-24 DIAGNOSIS — E1169 Type 2 diabetes mellitus with other specified complication: Secondary | ICD-10-CM

## 2023-02-24 DIAGNOSIS — I639 Cerebral infarction, unspecified: Secondary | ICD-10-CM

## 2023-02-24 DIAGNOSIS — I10 Essential (primary) hypertension: Secondary | ICD-10-CM

## 2023-02-24 DIAGNOSIS — N049 Nephrotic syndrome with unspecified morphologic changes: Secondary | ICD-10-CM

## 2023-02-24 DIAGNOSIS — E119 Type 2 diabetes mellitus without complications: Secondary | ICD-10-CM | POA: Diagnosis not present

## 2023-02-24 DIAGNOSIS — F332 Major depressive disorder, recurrent severe without psychotic features: Secondary | ICD-10-CM | POA: Diagnosis not present

## 2023-02-24 NOTE — Telephone Encounter (Signed)
Copied from CRM [id number]. Topic: General - Other >> Feb 24, 2023 10:40 AM Macon Large wrote: Reason for CRM: Rosey Bath with [employer] requests office notes for shower chair and rollator. Fax# [phone]

## 2023-02-24 NOTE — Progress Notes (Signed)
[facility]  PATIENT: Stacey Lucas DOB: [birth year]  REFERRING CLINICIAN: Storm Frisk, MD HISTORY FROM: patient REASON FOR VISIT: new consult   HISTORICAL  CHIEF COMPLAINT:  Chief Complaint  Patient presents with   Follow-up    Patient in room #6 and alone. Patient states she here to f/u with her stroke.    HISTORY OF PRESENT ILLNESS:   47 year old female here for evaluation of hospital stroke follow-up.  History of left basal ganglia stroke in 2023.  Presented to hospital in March 2024 due to increasing right-sided weakness and slurred speech.  She was diagnosed with left pontine ischemic infarction.  She was treated with aspirin and Plavix.  Due to kidney disease and nephrotic syndrome she was recommended to start anticoagulation and aspirin and Plavix were discontinued and she was changed to Eliquis.  Since that time patient is stable.  She is being monitored by nephrology and PCP.  She is planning to start dialysis soon.  Stroke symptoms are stable.   REVIEW OF SYSTEMS: Full 14 system review of systems performed and negative with exception of: as per HPI.  ALLERGIES: Allergies  Allergen Reactions   Gabapentin     Weakness/impairs balance    HOME MEDICATIONS: Outpatient Medications Prior to Visit  Medication Sig Dispense Refill   Accu-Chek Softclix Lancets lancets SMARTSIG:Topical 1-4 Times Daily     amLODipine (NORVASC) 10 MG tablet Take 1 tablet (10 mg total) by mouth daily. 90 tablet 1   apixaban (ELIQUIS) 2.5 MG TABS tablet Take 1 tablet (2.5 mg total) by mouth 2 (two) times daily. 120 tablet 1   atorvastatin (LIPITOR) 80 MG tablet Take 1 tablet (80 mg total) by mouth daily. 90 tablet 2   azelastine (OPTIVAR) 0.05 % ophthalmic solution Place 1 drop into both eyes 2 (two) times daily. 6 mL 1   blood glucose meter kit and supplies KIT Dispense based on patient and insurance preference. Use up to four times daily as directed. 1 each 0    carvedilol (COREG) 6.25 MG tablet Take 0.5 tablets (3.125 mg total) by mouth 2 (two) times daily with a meal. 120 tablet 1   Darbepoetin Alfa (ARANESP) 60 MCG/0.3ML SOSY injection Inject 0.3 mLs (60 mcg total) into the skin every Saturday at 6 PM. 4.2 mL 0   citalopram (CELEXA) 10 MG tablet Take 1 tablet (10 mg total) by mouth daily. (Patient not taking: Reported on 02/24/2023) 60 tablet 3   clopidogrel (PLAVIX) 75 MG tablet Take 1 tablet (75 mg total) by mouth daily. (Patient not taking: Reported on 02/24/2023) 90 tablet 2   furosemide (LASIX) 40 MG tablet Take 1 tablet (40 mg total) by mouth daily. (Patient not taking: Reported on 02/24/2023) 60 tablet 2   No facility-administered medications prior to visit.      PHYSICAL EXAM  GENERAL EXAM/CONSTITUTIONAL: Vitals:  Vitals:   02/24/23 1602  BP: (!) 146/76  Pulse: 89  Weight: 154 lb 12.8 oz (70.2 kg)  Height: 5\' 5"  (1.651 m)   Body mass index is 25.76 kg/m. Wt Readings from Last 3 Encounters:  02/24/23 154 lb 12.8 oz (70.2 kg)  02/20/23 150 lb (68 kg)  02/04/23 154 lb 12.8 oz (70.2 kg)   Patient is in no distress; well developed, nourished and groomed; neck is supple  CARDIOVASCULAR: Examination of carotid arteries is normal; no carotid bruits Regular rate and rhythm, no murmurs Examination of peripheral vascular system by observation and palpation is normal  EYES: Ophthalmoscopic exam  of optic discs and posterior segments is normal; no papilledema or hemorrhages No results found.  MUSCULOSKELETAL: Gait, strength, tone, movements noted in Neurologic exam below  NEUROLOGIC: MENTAL STATUS:      No data to display         awake, alert, oriented to person, place and time recent and remote memory intact normal attention and concentration language fluent, comprehension intact, naming intact fund of knowledge appropriate  CRANIAL NERVE:  2nd - no papilledema on fundoscopic exam 2nd, 3rd, 4th, 6th - pupils equal and  reactive to light, visual fields full to confrontation, extraocular muscles intact, no nystagmus 5th - facial sensation symmetric 7th - facial strength --> LEFT LOWER FACIAL WEAKNESS 8th - hearing intact 9th - palate elevates symmetrically, uvula midline 11th - shoulder shrug symmetric 12th - tongue protrusion midline  MOTOR:  normal bulk and tone BUE DELTOID 4, BICEPS / TRICEPS 4; FINGER ABDUCTION 3 (WITH ATROPHY); GRIP (RIGHT 3, LEFT 4) BILATERAL HIP FLEX 3-4; BILATERAL DORSIFLEXION 2  SENSORY:  normal and symmetric to light touch; DECR IN FEET  COORDINATION:  finger-nose-finger, fine finger movements normal  REFLEXES:  deep tendon reflexes TRACE and symmetric  GAIT/STATION:  narrow based gait; USING WALKER     DIAGNOSTIC DATA (LABS, IMAGING, TESTING) - I reviewed patient records, labs, notes, testing and imaging myself where available.  Lab Results  Component Value Date   WBC 9.8 02/20/2023   HGB 9.0 (L) 02/20/2023   HCT 28.2 (L) 02/20/2023   MCV 74.8 (L) 02/20/2023   PLT 273 02/20/2023      Component Value Date/Time   NA 140 02/20/2023 1706   NA 143 03/05/2022 1045   K 3.6 02/20/2023 1706   CL 114 (H) 02/20/2023 1706   CO2 17 (L) 02/20/2023 1706   GLUCOSE 80 02/20/2023 1706   BUN 60 (H) 02/20/2023 1706   BUN 21 03/05/2022 1045   CREATININE 7.85 (H) 02/20/2023 1706   CALCIUM 8.3 (L) 02/20/2023 1706   PROT 4.9 (L) 02/20/2023 1706   PROT 5.3 (L) 03/05/2022 1045   ALBUMIN 1.6 (L) 02/20/2023 1706   ALBUMIN 2.9 (L) 03/05/2022 1045   AST 14 (L) 02/20/2023 1706   ALT 8 02/20/2023 1706   ALKPHOS 49 02/20/2023 1706   BILITOT 0.6 02/20/2023 1706   BILITOT <0.2 03/05/2022 1045   GFRNONAA 6 (L) 02/20/2023 1706   GFRAA >60 04/14/2020 1335   Lab Results  Component Value Date   CHOL 190 11/15/2022   HDL 50 11/15/2022   LDLCALC 99 11/15/2022   TRIG 205 (H) 11/15/2022   CHOLHDL 3.8 11/15/2022   Lab Results  Component Value Date   HGBA1C 5.6 11/14/2022    Lab Results  Component Value Date   VITAMINB12 738 10/30/2020   Lab Results  Component Value Date   TSH 1.040 10/30/2020    11/21/22 kidney biopsy - diffuse global and segmental glomerulosclerosis; likely related to long standing hypertension and diabetes  11/14/22 MRI brain - Small acute infarct in the left pons.    ASSESSMENT AND PLAN  47 y.o. year old female here with:   Dx:  1. Left pontine stroke (HCC)   2. Type 2 diabetes mellitus with other specified complication, without long-term current use of insulin (HCC)   3. Essential hypertension   4. ESRD (end stage renal disease) (HCC)   5. Nephrotic syndrome       PLAN:  Stroke:  Left pontine infarct, likely small vessel disease in the setting of uncontrolled  risk factors and cocaine use  Code Stroke CT head No acute abnormality. ASPECTS 10.  MRI  Small acute infarct in the left pons MRA  No LVO or significant stenosis  2D Echo EF 60-65%, left atria severely dilated LDL 99 HgbA1c 5.6 aspirin 81 mg daily prior to admission, then on aspirin 81 mg daily and clopidogrel 75 mg daily for 3 weeks and then plavix alone; then changed to apixaban 2.5mg  twice a day for nephrotic syndrome   History of stroke 05/2021 admitted for acute right caudate infarct, MRI also showed old left BG/CR infarct.  CTA head and neck unremarkable.  EF 60 to 65%, LDL 245, A1c 8.6.  Discharged on DAPT and Crestor. 12/2021 admitted for left CR/BG infarct status post TNK.  CT head and neck showed possible left M2 moderate to severe stenosis.  EF 70 to 75%.  LDL 117, A1c 7.1.  Discharged on DAPT and Crestor 20.   Hypertension Home meds: Norvasc, coreg Long-term BP goal normotensive   Hyperlipidemia LDL 99, goal < 70 Continue atorvastatin 80mg  daily   Diabetes type II Controlled Home meds: none HgbA1c 5.6, goal < 7.0    Tobacco abuse Current smoker Smoking cessation encouraged; cutting down   PRIOR Cocaine abuse Patient has stopped  cocaine  MODERATE-SEVERE POLYNEUROPATHY (related to prior diabetes A1c >14 in 2019; now A1c 5.6; also with ESRD now awaiting dialysis) - continue supportive care  Return for return to PCP.    Suanne Marker, MD 02/24/2023, 4:29 PM Certified in Neurology, Neurophysiology and Neuroimaging  [facility] [address] [place], [place] [address] [phone]

## 2023-02-24 NOTE — Patient Instructions (Signed)
-  continue current meds  

## 2023-02-25 ENCOUNTER — Encounter: Payer: Self-pay | Admitting: Physical Therapy

## 2023-02-25 ENCOUNTER — Ambulatory Visit: Payer: 59 | Admitting: Physical Therapy

## 2023-02-25 VITALS — BP 168/83 | HR 83

## 2023-02-25 DIAGNOSIS — R2681 Unsteadiness on feet: Secondary | ICD-10-CM | POA: Diagnosis not present

## 2023-02-25 DIAGNOSIS — M6281 Muscle weakness (generalized): Secondary | ICD-10-CM

## 2023-02-25 DIAGNOSIS — I639 Cerebral infarction, unspecified: Secondary | ICD-10-CM | POA: Diagnosis not present

## 2023-02-25 DIAGNOSIS — F332 Major depressive disorder, recurrent severe without psychotic features: Secondary | ICD-10-CM | POA: Diagnosis not present

## 2023-02-25 DIAGNOSIS — R2689 Other abnormalities of gait and mobility: Secondary | ICD-10-CM

## 2023-02-25 DIAGNOSIS — E119 Type 2 diabetes mellitus without complications: Secondary | ICD-10-CM | POA: Diagnosis not present

## 2023-02-25 NOTE — Therapy (Signed)
OUTPATIENT PHYSICAL THERAPY NEURO TREATMENT   Patient Name: Stacey Lucas MRN: [id number] DOB:Sep 30, 1975, 47 y.o., female Today's Date: 02/25/2023   PCP: Storm Frisk, MD REFERRING PROVIDER:Wright, Charlcie Cradle, MD   END OF SESSION:  PT End of Session - 02/25/23 0935     Visit Number 2    Number of Visits 13    Date for PT Re-Evaluation 04/02/23    Authorization Type [employer] MCD    Authorization Time Period 02/19/23 to 04/02/23    Authorization - Number of Visits 12    PT Start Time 0932    PT Stop Time 1012    PT Time Calculation (min) 40 min    Equipment Utilized During Treatment Gait belt    Activity Tolerance Patient tolerated treatment well;Patient limited by fatigue    Behavior During Therapy [facility] for tasks assessed/performed             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 12/29/2021   Anxiety and depression    Cerebral thrombosis with cerebral infarction 05/24/2021   Cocaine abuse (HCC)    Depression with suicidal ideation    [facility] admission 04/2018   Diabetes mellitus without complication (HCC)    Type II   GERD (gastroesophageal reflux disease)    Hyperlipidemia    Hypertension    Impingement syndrome of right shoulder    Migraine headache    Osteoarthritis of right knee 05/24/2021   Pilonidal abscess 05/2013   Pyelonephritis    Right thyroid nodule    Sciatica    Past Surgical History:  Procedure Laterality Date   BUBBLE STUDY  01/01/2022   Procedure: BUBBLE STUDY;  Surgeon: Maisie Fus, MD;  Location: [facility] ENDOSCOPY;  Service: Cardiovascular;;   TEE WITHOUT CARDIOVERSION N/A 01/01/2022   Procedure: TRANSESOPHAGEAL ECHOCARDIOGRAM (TEE);  Surgeon: Maisie Fus, MD;  Location: [facility] ENDOSCOPY;  Service: Cardiovascular;  Laterality: N/A;   tubal     Patient Active Problem List   Diagnosis Date Noted   ESRD (end stage renal disease)  (HCC) 02/04/2023   Polysubstance abuse (HCC) 12/03/2022   Nephrotic syndrome 11/15/2022   Cerebrovascular accident (CVA) (HCC) 11/15/2022   Anemia 01/16/2022   Atrial fibrillation (HCC) 07/04/2021   H/O: stroke 07/04/2021   Osteoarthritis of right knee 05/24/2021   Type 2 diabetes mellitus with other specified complication, without long-term current use of insulin (HCC) 05/23/2021   Essential hypertension 05/23/2021   Hyperlipidemia 05/23/2021   Thyroid nodule 05/23/2021   MDD (major depressive disorder), recurrent severe, without psychosis (HCC) 04/27/2018    ONSET DATE: [place] CVA found on imaging early March 2024  REFERRING DIAG: I63.9 (ICD-10-CM) - Left pontine CVA (HCC)  THERAPY DIAG:  Muscle weakness (generalized)  Other abnormalities of gait and mobility  Unsteadiness on feet  Rationale for Evaluation and Treatment: Rehabilitation  SUBJECTIVE:  SUBJECTIVE STATEMENT:  Nothing new. Uses her rollator all the time. No falls. Pt reports she took her BP medication this morning.   Pt accompanied by: self  PERTINENT HISTORY: Corona radiata/basal ganglia infarct 2023, anxiety/depression, cerebral thrombosis with cerebral infarction 2022, hx cocaine abuse, hx depression with suicidal ideation, DM, HLD, HTN, migraine, R shoulder impingement, sciatica,   PAIN:  Are you having pain? Yes: NPRS scale: 6/10 Pain location: R side of body  Pain description: sharp, like an ice pick up and down my body  Aggravating factors: nothing  Relieving factors: nothing   Vitals:   02/25/23 0939  BP: (!) 168/83  Pulse: 83     PRECAUTIONS: Fall, impaired vision after CVA in 2023, watch BP   WEIGHT BEARING RESTRICTIONS: No  FALLS: Has patient fallen in last 6 months? No, but does have fear of falling    LIVING ENVIRONMENT: Lives with: lives with their partner Lives in: House/apartment Stairs: No Has following equipment at home: Environmental consultant - 2 wheeled, Environmental consultant - 4 wheeled, and bedside commode, also trying to get a shower seat   PLOF: Independent with household mobility with device, Independent with gait, Independent with transfers, Requires assistive device for independence, Needs assistance with ADLs, and Needs assistance with homemaking  PATIENT GOALS: be able to walk without the walker   OBJECTIVE:   DIAGNOSTIC FINDINGS: EXAM: MRI HEAD WITHOUT CONTRAST   TECHNIQUE: Multiplanar, multiecho pulse sequences of the brain and surrounding structures were obtained without intravenous contrast.   COMPARISON:  Same day CT head.   FINDINGS: Brain: Small acute infarct in the left pons. No significant edema or mass effect. No evidence of acute hemorrhage, mass lesion, midline shift or hydrocephalus. Patchy white matter T2/FLAIR hyperintensities, nonspecific but compatible with chronic microvascular ischemic disease.   Vascular: Major arterial flow voids are maintained at the skull base.   Skull and upper cervical spine: Normal marrow signal.   Sinuses/Orbits: Mild paranasal sinus mucosal thickening. Remote left medial orbital wall fracture.   Other: No sizable mastoid effusions.   IMPRESSION: Small acute infarct in the left pons.  COGNITION: Overall cognitive status: Within functional limits for tasks assessed   SENSATION: Reports numbness bottom of R foot (unclear if this is new or chronic since first CVA in 2023)  COORDINATION: RAM testing impaired R UE and R LE    LOWER EXTREMITY MMT:    MMT Right Eval Left Eval  Hip flexion 3 3  Hip extension    Hip abduction    Hip adduction    Hip internal rotation    Hip external rotation    Knee flexion    Knee extension 4- at best  4+  Ankle dorsiflexion 3 3- at best   Ankle plantarflexion    Ankle inversion    Ankle  eversion    (Blank rows = not tested)   TODAY'S TREATMENT:      GAIT: Gait pattern: step through pattern, decreased arm swing- Right, decreased arm swing- Left, decreased hip/knee flexion- Right, decreased hip/knee flexion- Left, decreased ankle dorsiflexion- Right, decreased ankle dorsiflexion- Left, trunk flexed, narrow BOS, poor foot clearance- Right, and poor foot clearance- Left Distance walked: 30-74ft - in and out of clinic  Assistive device utilized: Walker - 4 wheeled Level of assistance: Supervision Comments: B foot drop noted/poor ankle DF. Very easily fatigued with gait  Access Code: [email or web address] URL: [email or web address] Date: 02/25/2023 Prepared by: Sherlie Ban  Initiated HEP for R>LLE strengthening, see MedBridge for more details:   Exercises - Supine Bridge  - 2 x daily - 7 x weekly - 1 sets - 10 reps - cues for glute activation first  - Seated Heel Toe Raises  - 2 x daily - 7 x weekly - 1-2 sets - 10 reps - Seated March  - 2 x daily - 7 x weekly - 2 sets - 10 reps - seated hip flexion and ABD   - Sit to Stand with Armchair  - 2 x daily - 7 x weekly - 2 sets - 5 reps - pt needing cues to scoot out towards edge, tuck feet back and have equal weight bearing with BLE, and for incr forward lean, pt takes incr time to stand and needs to hold onto rollator once in standing. When going to sit, cued to bend knees (pt with significant difficulty with this), and reaching behind to sit down   - Seated Long Arc Quad (Mirrored)  - 2 x daily - 7 x weekly - 2 sets - 10 reps - cued for full RLE, decr AROM with incr reps due to fatigue    Performed 6 reps of mini squats to work on initiating bending knees and knee extension strength with BUE support on rollator, needs tactile cues for proper technique and alignment and sitting back onto heels, pt unable  to perform correctly and had incr knee flexion   With BUE support on rollator (PT also helping to steady it) 2 x 10 reps alternating marching, decr ROM with RLE    PATIENT EDUCATION: Education details: Initial HEP for strengthening, wearing sneakers to next session to trial AFOs and purpose of AFOs. Educated getting a blood pressure cuff and checking it at home due to HTN and hx of CVAs  Person educated: Patient Education method: Explanation, Verbal cues, and Handouts Education comprehension: verbalized understanding, returned demonstration, and needs further education  HOME EXERCISE PROGRAM: Access Code: [email or web address] URL: [email or web address] Date: 02/25/2023 Prepared by: Sherlie Ban  Exercises - Supine Bridge  - 2 x daily - 7 x weekly - 1 sets - 10 reps - Seated Heel Toe Raises  - 2 x daily - 7 x weekly - 1-2 sets - 10 reps - Seated March  - 2 x daily - 7 x weekly - 2 sets - 10 reps - Sit to Stand with Armchair  - 2 x daily - 7 x weekly - 2 sets - 5 reps - Seated Long Arc Quad (Mirrored)  - 2 x daily - 7 x weekly - 2 sets - 10 reps  GOALS: Goals reviewed with patient? No  SHORT TERM GOALS: Target date: 03/12/2023    Will be compliant with appropriate progressive HEP  Baseline: Goal status: INITIAL  2.  Will be able to ambulate 162ft with LRAD with no rest breaks and  RPE no more than 2/10 Baseline:  Goal status: INITIAL  3.  Will trial appropriate AFOs vs Foot Up brace to address B foot drop/improve gait pattern and energy conservation Baseline:  Goal status: INITIAL  4.  Will be able to name 3 ways to reduce fall risk at home and in the community  Baseline:  Goal status: INITIAL    LONG TERM GOALS: Target date: 04/02/2023    MMT to improve by 1 grade in all weak groups  Baseline:  Goal status: INITIAL  2.  Will  complete 5x STS test in 25 seconds or less with UEs to show improved functional transfers/functional strength  Baseline:  Goal status:  INITIAL  3.  Will complete TUG test in 15 seconds or less with LRAD to show improved functional balance skills  Baseline:  Goal status: INITIAL  4.  Will be able to ambulate 236ft with LRAD with no rest breaks and RPE no more than 3/10 to improve community access  Baseline:  Goal status: INITIAL    ASSESSMENT:  CLINICAL IMPRESSION: Today's skilled session focused on initiating HEP for RLE>LLE strengthening. Pt's BP elevated, but still WNL to participate in PT. Pt fatigues easily, esp with sit <> stands and needing rest breaks during session. Pt with foot drop bilaterally, unable to trial different AFOs today, will have to do in a future session. Educated on purpose of AFOs, with pt in agreement to try and will wear sneakers to next session. Will continue per POC.   OBJECTIVE IMPAIRMENTS: Abnormal gait, cardiopulmonary status limiting activity, decreased activity tolerance, decreased balance, decreased coordination, decreased mobility, difficulty walking, decreased strength, impaired sensation, impaired vision/preception, and pain.   ACTIVITY LIMITATIONS: standing, stairs, transfers, bed mobility, and locomotion level  PARTICIPATION LIMITATIONS: shopping, community activity, yard work, and church  PERSONAL FACTORS: Age, Behavior pattern, Education, Fitness, Past/current experiences, Social background, and Time since onset of injury/illness/exacerbation are also affecting patient's functional outcome.   REHAB POTENTIAL: Fair hx of recurrent strokes, hx of high no-show rate  CLINICAL DECISION MAKING: Evolving/moderate complexity  EVALUATION COMPLEXITY: Moderate  PLAN:  PT FREQUENCY: 2x/week  PT DURATION: 6 weeks  PLANNED INTERVENTIONS: Therapeutic exercises, Therapeutic activity, Neuromuscular re-education, Balance training, Gait training, Patient/Family education, Self Care, Stair training, Orthotic/Fit training, DME instructions, Aquatic Therapy, and Re-evaluation  PLAN FOR NEXT  SESSION: trial AFOs vs foot up brace, focus on the functional- strength/balance/safety/functional activity tolerance. How was HEP?    Sherlie Ban, PT, [name] 02/25/23 10:15 AM

## 2023-02-26 DIAGNOSIS — E119 Type 2 diabetes mellitus without complications: Secondary | ICD-10-CM | POA: Diagnosis not present

## 2023-02-26 DIAGNOSIS — I639 Cerebral infarction, unspecified: Secondary | ICD-10-CM | POA: Diagnosis not present

## 2023-02-26 DIAGNOSIS — F332 Major depressive disorder, recurrent severe without psychotic features: Secondary | ICD-10-CM | POA: Diagnosis not present

## 2023-02-26 NOTE — Telephone Encounter (Signed)
Yes okay to send

## 2023-02-26 NOTE — Telephone Encounter (Signed)
Ok to send last OV note?

## 2023-02-27 ENCOUNTER — Encounter: Payer: Self-pay | Admitting: Physical Therapy

## 2023-02-27 ENCOUNTER — Ambulatory Visit: Payer: 59 | Admitting: Physical Therapy

## 2023-02-27 VITALS — BP 134/85 | HR 83

## 2023-02-27 DIAGNOSIS — M6281 Muscle weakness (generalized): Secondary | ICD-10-CM

## 2023-02-27 DIAGNOSIS — R2689 Other abnormalities of gait and mobility: Secondary | ICD-10-CM | POA: Diagnosis not present

## 2023-02-27 DIAGNOSIS — E119 Type 2 diabetes mellitus without complications: Secondary | ICD-10-CM | POA: Diagnosis not present

## 2023-02-27 DIAGNOSIS — R2681 Unsteadiness on feet: Secondary | ICD-10-CM | POA: Diagnosis not present

## 2023-02-27 DIAGNOSIS — I639 Cerebral infarction, unspecified: Secondary | ICD-10-CM | POA: Diagnosis not present

## 2023-02-27 DIAGNOSIS — F332 Major depressive disorder, recurrent severe without psychotic features: Secondary | ICD-10-CM | POA: Diagnosis not present

## 2023-02-27 NOTE — Therapy (Signed)
OUTPATIENT PHYSICAL THERAPY NEURO TREATMENT   Patient Name: Stacey Lucas MRN: [id number] DOB:03-10-1976, 47 y.o., female Today's Date: 02/27/2023   PCP: Storm Frisk, MD REFERRING PROVIDER:Wright, Charlcie Cradle, MD   END OF SESSION:  PT End of Session - 02/27/23 1234     Visit Number 3    Number of Visits 13    Date for PT Re-Evaluation 04/02/23    Authorization Type [employer] M[employer]    Authorization Time Period 02/19/23 to 04/02/23    Authorization - Number of Visits 12    PT Start Time 1230    PT Stop Time 1315    PT Time Calculation (min) 45 min    Equipment Utilized During Treatment Gait belt    Activity Tolerance Patient tolerated treatment well;Patient limited by fatigue    Behavior During Therapy [facility] for tasks assessed/performed             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 12/29/2021   Anxiety and depression    Cerebral thrombosis with cerebral infarction 05/24/2021   Cocaine abuse (HCC)    Depression with suicidal ideation    [facility] admission 04/2018   Diabetes mellitus without complication (HCC)    Type II   GERD (gastroesophageal reflux disease)    Hyperlipidemia    Hypertension    Impingement syndrome of right shoulder    Migraine headache    Osteoarthritis of right knee 05/24/2021   Pilonidal abscess 05/2013   Pyelonephritis    Right thyroid nodule    Sciatica    Past Surgical History:  Procedure Laterality Date   BUBBLE STUDY  01/01/2022   Procedure: BUBBLE STUDY;  Surgeon: Maisie Fus, MD;  Location: [facility] ENDOSCOPY;  Service: Cardiovascular;;   TEE WITHOUT CARDIOVERSION N/A 01/01/2022   Procedure: TRANSESOPHAGEAL ECHOCARDIOGRAM (TEE);  Surgeon: Maisie Fus, MD;  Location: [facility] ENDOSCOPY;  Service: Cardiovascular;  Laterality: N/A;   tubal     Patient Active Problem List   Diagnosis Date Noted   ESRD (end stage renal disease)  (HCC) 02/04/2023   Polysubstance abuse (HCC) 12/03/2022   Nephrotic syndrome 11/15/2022   Cerebrovascular accident (CVA) (HCC) 11/15/2022   Anemia 01/16/2022   Atrial fibrillation (HCC) 07/04/2021   H/O: stroke 07/04/2021   Osteoarthritis of right knee 05/24/2021   Type 2 diabetes mellitus with other specified complication, without long-term current use of insulin (HCC) 05/23/2021   Essential hypertension 05/23/2021   Hyperlipidemia 05/23/2021   Thyroid nodule 05/23/2021   MDD (major depressive disorder), recurrent severe, without psychosis (HCC) 04/27/2018    ONSET DATE: [place] CVA found on imaging early March 2024  REFERRING DIAG: I63.9 (ICD-10-CM) - Left pontine CVA (HCC)  THERAPY DIAG:  Muscle weakness (generalized)  Other abnormalities of gait and mobility  Unsteadiness on feet  Rationale for Evaluation and Treatment: Rehabilitation  SUBJECTIVE:  SUBJECTIVE STATEMENT:  She ambulates into clinic using rollator.  She does all exercises on HEP but the bridges as they hurt her back.  No falls. Pt reports she took her BP medication this morning.   Pt accompanied by: self  PERTINENT HISTORY: Corona radiata/basal ganglia infarct 2023, anxiety/depression, cerebral thrombosis with cerebral infarction 2022, hx cocaine abuse, hx depression with suicidal ideation, DM, HLD, HTN, migraine, R shoulder impingement, sciatica,   PAIN:  Are you having pain? Yes: NPRS scale: 5/10 Pain location: R knee Pain description: sharp, sticking in her Aggravating factors: nothing  Relieving factors: nothing   LUE in sitting prior to session: Vitals:   02/27/23 1240  BP: 134/85  Pulse: 83    PRECAUTIONS: Fall, impaired vision after CVA in 2023, watch BP   WEIGHT BEARING RESTRICTIONS: No  FALLS: Has  patient fallen in last 6 months? No, but does have fear of falling   LIVING ENVIRONMENT: Lives with: lives with their partner Lives in: House/apartment Stairs: No Has following equipment at home: Environmental consultant - 2 wheeled, Environmental consultant - 4 wheeled, and bedside commode, also trying to get a shower seat   PLOF: Independent with household mobility with device, Independent with gait, Independent with transfers, Requires assistive device for independence, Needs assistance with ADLs, and Needs assistance with homemaking  PATIENT GOALS: be able to walk without the walker   OBJECTIVE:   DIAGNOSTIC FINDINGS: EXAM: MRI HEAD WITHOUT CONTRAST   TECHNIQUE: Multiplanar, multiecho pulse sequences of the brain and surrounding structures were obtained without intravenous contrast.   COMPARISON:  Same day CT head.   FINDINGS: Brain: Small acute infarct in the left pons. No significant edema or mass effect. No evidence of acute hemorrhage, mass lesion, midline shift or hydrocephalus. Patchy white matter T2/FLAIR hyperintensities, nonspecific but compatible with chronic microvascular ischemic disease.   Vascular: Major arterial flow voids are maintained at the skull base.   Skull and upper cervical spine: Normal marrow signal.   Sinuses/Orbits: Mild paranasal sinus mucosal thickening. Remote left medial orbital wall fracture.   Other: No sizable mastoid effusions.   IMPRESSION: Small acute infarct in the left pons.  COGNITION: Overall cognitive status: Within functional limits for tasks assessed   SENSATION: Reports numbness bottom of R foot (unclear if this is new or chronic since first CVA in 2023)  COORDINATION: RAM testing impaired R UE and R LE    LOWER EXTREMITY MMT:    MMT Right Eval Left Eval  Hip flexion 3 3  Hip extension    Hip abduction    Hip adduction    Hip internal rotation    Hip external rotation    Knee flexion    Knee extension 4- at best  4+  Ankle dorsiflexion  3 3- at best   Ankle plantarflexion    Ankle inversion    Ankle eversion    (Blank rows = not tested)   TODAY'S TREATMENT:      GAIT: Gait pattern: step through pattern, decreased arm swing- Right, decreased arm swing- Left, decreased hip/knee flexion- Right, decreased hip/knee flexion- Left, decreased ankle dorsiflexion- Right, decreased ankle dorsiflexion- Left, trunk flexed, narrow BOS, poor foot clearance- Right, and poor foot clearance- Left Distance walked: 115' + 200' + 200' Assistive device utilized: Environmental consultant - 4 wheeled Level of assistance: Supervision Comments: Bilateral foot drop w/ ankle inversion right worse than left.  Trialed foot-up brace w/o adequate correction.  Second bout used Posterior [place] AFO.  Pt feels her foot is being  lifted high, but she is demonstrating expected improvements in prior deficits noted.    Third trial of gait with more restrictive right posterior [place] over level indoor flooring.  She has almost steppage style gait w/ compensatory ER of RLE develop with increased distance w/ Thu[place] brace.  She also feels her heel does not sit as well in this brace.  PT wonders if heel wedge may be beneficial with AFO-to be trailed next time.  Pt needs standing rest during all bouts of gait and seated rest between.  SpO2 100%, HR 84 bpm.  Dyspneic on exertion, but denies lightheadedness.  ModA for donning/doffing AFOs.                                                                                                                   PATIENT EDUCATION: Education details: Continue HEP.  Trialing AFOs again next visit possibly with heel lift.  Person educated: Patient Education method: Explanation, Verbal cues, and Handouts Education comprehension: verbalized understanding, returned demonstration, and needs further education  HOME EXERCISE PROGRAM: Access Code: [email or web address] URL: [email or web address] Date: 02/25/2023 Prepared by: Sherlie Ban  Exercises - Supine Bridge  - 2 x daily - 7 x weekly - 1 sets - 10 reps - Seated Heel Toe Raises  - 2 x daily - 7 x weekly - 1-2 sets - 10 reps - Seated March  - 2 x daily - 7 x weekly - 2 sets - 10 reps - Sit to Stand with Armchair  - 2 x daily - 7 x weekly - 2 sets - 5 reps - Seated Long Arc Quad (Mirrored)  - 2 x daily - 7 x weekly - 2 sets - 10 reps  GOALS: Goals reviewed with patient? No  SHORT TERM GOALS: Target date: 03/12/2023    Will be compliant with appropriate progressive HEP  Baseline: Goal status: INITIAL  2.  Will be able to ambulate 140ft with LRAD with no rest breaks and  RPE no more than 2/10 Baseline:  Goal status: INITIAL  3.  Will trial appropriate AFOs vs Foot Up brace to address B foot drop/improve gait pattern and energy conservation Baseline:  Goal status: INITIAL  4.  Will be able to name 3 ways to reduce fall risk at home and in the community  Baseline:  Goal status: INITIAL    LONG TERM GOALS: Target date: 04/02/2023    MMT to improve by 1 grade in all weak groups  Baseline:  Goal status: INITIAL  2.  Will complete 5x STS test in 25 seconds or less with UEs to show improved functional transfers/functional strength  Baseline:  Goal status: INITIAL  3.  Will complete TUG test in 15 seconds or less with LRAD to show improved functional balance skills  Baseline:  Goal status: INITIAL  4.  Will be able to ambulate 233ft with LRAD with no rest breaks and RPE no more than 3/10 to improve community access  Baseline:  Goal status: INITIAL    ASSESSMENT:  CLINICAL IMPRESSION: Focus of skilled session today on trialing bracing options including foot-up brace vs AFO.  Pt does better with AFO option than foot-up, but may benefit from further trial of heel lift in addition to AFO to better control mechanics of the knee during stance.  She benefits from further skilled PT intervention to optimize safety and form of functional mobility.    OBJECTIVE IMPAIRMENTS: Abnormal gait, cardiopulmonary status limiting activity, decreased activity tolerance, decreased balance, decreased coordination, decreased mobility, difficulty walking, decreased strength, impaired sensation, impaired vision/preception, and pain.   ACTIVITY LIMITATIONS: standing, stairs, transfers, bed mobility, and locomotion level  PARTICIPATION LIMITATIONS: shopping, community activity, yard work, and church  PERSONAL FACTORS: Age, Behavior pattern, Education, Fitness, Past/current experiences, Social background, and Time since onset of injury/illness/exacerbation are also affecting patient's functional outcome.   REHAB POTENTIAL: Fair hx of recurrent strokes, hx of high no-show rate  CLINICAL DECISION MAKING: Evolving/moderate complexity  EVALUATION COMPLEXITY: Moderate  PLAN:  PT FREQUENCY: 2x/week  PT DURATION: 6 weeks  PLANNED INTERVENTIONS: Therapeutic exercises, Therapeutic activity, Neuromuscular re-education, Balance training, Gait training, Patient/Family education, Self Care, Stair training, Orthotic/Fit training, DME instructions, Aquatic Therapy, and Re-evaluation  PLAN FOR NEXT SESSION: trial AFOs again-were Thuasne deficits from fatigue vs brace mechanics?  Trial heel lift w/ AFO?, focus on the functional- strength/balance/safety/functional activity tolerance. Substitute glut exercise on HEP in place of bridges-painful.    Camille Bal, PT, [name] 02/27/23 1:15 PM

## 2023-02-27 NOTE — Telephone Encounter (Signed)
Paperwork successfully faxed this morning

## 2023-02-28 ENCOUNTER — Telehealth: Payer: Self-pay | Admitting: Critical Care Medicine

## 2023-02-28 DIAGNOSIS — F332 Major depressive disorder, recurrent severe without psychotic features: Secondary | ICD-10-CM | POA: Diagnosis not present

## 2023-02-28 DIAGNOSIS — E119 Type 2 diabetes mellitus without complications: Secondary | ICD-10-CM | POA: Diagnosis not present

## 2023-02-28 DIAGNOSIS — I639 Cerebral infarction, unspecified: Secondary | ICD-10-CM | POA: Diagnosis not present

## 2023-02-28 NOTE — Telephone Encounter (Signed)
Copied from CRM [id number]. Topic: General - Inquiry >> Feb 28, 2023 10:01 AM Haroldine Laws wrote: Reason for CRM: Tinnie Gens with [employer] called saying they cannot get in touch with the patient.    He has the order for the shower chair and has been trying to call someone  He said they have the same number that we have.  They want to deliver it but can't get in touch with the patient.  CB#  [phone]

## 2023-02-28 NOTE — Telephone Encounter (Signed)
Called [employer] ad they stated for the patient to call the number below to confirm delivery, called patient and gave her number   Patient also ask is there a support group her caretaker can go to or have any information them

## 2023-03-01 DIAGNOSIS — F332 Major depressive disorder, recurrent severe without psychotic features: Secondary | ICD-10-CM | POA: Diagnosis not present

## 2023-03-01 DIAGNOSIS — E119 Type 2 diabetes mellitus without complications: Secondary | ICD-10-CM | POA: Diagnosis not present

## 2023-03-01 DIAGNOSIS — I639 Cerebral infarction, unspecified: Secondary | ICD-10-CM | POA: Diagnosis not present

## 2023-03-02 DIAGNOSIS — E119 Type 2 diabetes mellitus without complications: Secondary | ICD-10-CM | POA: Diagnosis not present

## 2023-03-02 DIAGNOSIS — I639 Cerebral infarction, unspecified: Secondary | ICD-10-CM | POA: Diagnosis not present

## 2023-03-02 DIAGNOSIS — F332 Major depressive disorder, recurrent severe without psychotic features: Secondary | ICD-10-CM | POA: Diagnosis not present

## 2023-03-03 ENCOUNTER — Telehealth: Payer: Self-pay | Admitting: Physical Therapy

## 2023-03-03 ENCOUNTER — Ambulatory Visit: Payer: 59 | Admitting: Physical Therapy

## 2023-03-03 ENCOUNTER — Encounter: Payer: Self-pay | Admitting: Physical Therapy

## 2023-03-03 DIAGNOSIS — M6281 Muscle weakness (generalized): Secondary | ICD-10-CM

## 2023-03-03 DIAGNOSIS — I69398 Other sequelae of cerebral infarction: Secondary | ICD-10-CM

## 2023-03-03 DIAGNOSIS — E119 Type 2 diabetes mellitus without complications: Secondary | ICD-10-CM | POA: Diagnosis not present

## 2023-03-03 DIAGNOSIS — I639 Cerebral infarction, unspecified: Secondary | ICD-10-CM | POA: Diagnosis not present

## 2023-03-03 DIAGNOSIS — R2681 Unsteadiness on feet: Secondary | ICD-10-CM | POA: Diagnosis not present

## 2023-03-03 DIAGNOSIS — R2689 Other abnormalities of gait and mobility: Secondary | ICD-10-CM | POA: Diagnosis not present

## 2023-03-03 DIAGNOSIS — F332 Major depressive disorder, recurrent severe without psychotic features: Secondary | ICD-10-CM | POA: Diagnosis not present

## 2023-03-03 DIAGNOSIS — Z8673 Personal history of transient ischemic attack (TIA), and cerebral infarction without residual deficits: Secondary | ICD-10-CM

## 2023-03-03 NOTE — Therapy (Signed)
OUTPATIENT PHYSICAL THERAPY NEURO TREATMENT   Patient Name: Stacey Lucas MRN: [id number] DOB:June 22, 1976, 47 y.o., female Today's Date: 03/03/2023   PCP: Storm Frisk, MD REFERRING PROVIDER:Wright, Charlcie Cradle, MD   END OF SESSION:  PT End of Session - 03/03/23 1104     Visit Number 4    Number of Visits 13    Date for PT Re-Evaluation 04/02/23    Authorization Type [employer] M[employer]    Authorization Time Period 02/19/23 to 04/02/23    Authorization - Number of Visits 12    PT Start Time 1102    PT Stop Time 1143    PT Time Calculation (min) 41 min    Equipment Utilized During Treatment Gait belt    Activity Tolerance Patient tolerated treatment well;Patient limited by fatigue    Behavior During Therapy WFL for tasks assessed/performed             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 12/29/2021   Anxiety and depression    Cerebral thrombosis with cerebral infarction 05/24/2021   Cocaine abuse (HCC)    Depression with suicidal ideation    [facility] admission 04/2018   Diabetes mellitus without complication (HCC)    Type II   GERD (gastroesophageal reflux disease)    Hyperlipidemia    Hypertension    Impingement syndrome of right shoulder    Migraine headache    Osteoarthritis of right knee 05/24/2021   Pilonidal abscess 05/2013   Pyelonephritis    Right thyroid nodule    Sciatica    Past Surgical History:  Procedure Laterality Date   BUBBLE STUDY  01/01/2022   Procedure: BUBBLE STUDY;  Surgeon: Maisie Fus, MD;  Location: [facility] ENDOSCOPY;  Service: Cardiovascular;;   TEE WITHOUT CARDIOVERSION N/A 01/01/2022   Procedure: TRANSESOPHAGEAL ECHOCARDIOGRAM (TEE);  Surgeon: Maisie Fus, MD;  Location: [facility] ENDOSCOPY;  Service: Cardiovascular;  Laterality: N/A;   tubal     Patient Active Problem List   Diagnosis Date Noted   ESRD (end stage renal disease)  (HCC) 02/04/2023   Polysubstance abuse (HCC) 12/03/2022   Nephrotic syndrome 11/15/2022   Cerebrovascular accident (CVA) (HCC) 11/15/2022   Anemia 01/16/2022   Atrial fibrillation (HCC) 07/04/2021   H/O: stroke 07/04/2021   Osteoarthritis of right knee 05/24/2021   Type 2 diabetes mellitus with other specified complication, without long-term current use of insulin (HCC) 05/23/2021   Essential hypertension 05/23/2021   Hyperlipidemia 05/23/2021   Thyroid nodule 05/23/2021   MDD (major depressive disorder), recurrent severe, without psychosis (HCC) 04/27/2018    ONSET DATE: Pontine CVA found on imaging early March 2024  REFERRING DIAG: I63.9 (ICD-10-CM) - Left pontine CVA (HCC)  THERAPY DIAG:  Muscle weakness (generalized)  Other abnormalities of gait and mobility  Unsteadiness on feet  Rationale for Evaluation and Treatment: Rehabilitation  SUBJECTIVE:  SUBJECTIVE STATEMENT:  Went walking over the weekend in her apartment complex and reports felt tired afterwards. Liked using the AFOs last time.   Pt accompanied by: self  PERTINENT HISTORY: Corona radiata/basal ganglia infarct 2023, anxiety/depression, cerebral thrombosis with cerebral infarction 2022, hx cocaine abuse, hx depression with suicidal ideation, DM, HLD, HTN, migraine, R shoulder impingement, sciatica,   PAIN:  Are you having pain? Yes: NPRS scale: 6/10 Pain location: R knee Pain description: sharp, sticking in her Aggravating factors: nothing  Relieving factors: nothing   LUE in sitting prior to session: There were no vitals filed for this visit.   PRECAUTIONS: Fall, impaired vision after CVA in 2023, watch BP   WEIGHT BEARING RESTRICTIONS: No  FALLS: Has patient fallen in last 6 months? No, but does have fear of  falling   LIVING ENVIRONMENT: Lives with: lives with their partner Lives in: House/apartment Stairs: No Has following equipment at home: Environmental consultant - 2 wheeled, Environmental consultant - 4 wheeled, and bedside commode, also trying to get a shower seat   PLOF: Independent with household mobility with device, Independent with gait, Independent with transfers, Requires assistive device for independence, Needs assistance with ADLs, and Needs assistance with homemaking  PATIENT GOALS: be able to walk without the walker   OBJECTIVE:   DIAGNOSTIC FINDINGS: EXAM: MRI HEAD WITHOUT CONTRAST   TECHNIQUE: Multiplanar, multiecho pulse sequences of the brain and surrounding structures were obtained without intravenous contrast.   COMPARISON:  Same day CT head.   FINDINGS: Brain: Small acute infarct in the left pons. No significant edema or mass effect. No evidence of acute hemorrhage, mass lesion, midline shift or hydrocephalus. Patchy white matter T2/FLAIR hyperintensities, nonspecific but compatible with chronic microvascular ischemic disease.   Vascular: Major arterial flow voids are maintained at the skull base.   Skull and upper cervical spine: Normal marrow signal.   Sinuses/Orbits: Mild paranasal sinus mucosal thickening. Remote left medial orbital wall fracture.   Other: No sizable mastoid effusions.   IMPRESSION: Small acute infarct in the left pons.  COGNITION: Overall cognitive status: Within functional limits for tasks assessed   SENSATION: Reports numbness bottom of R foot (unclear if this is new or chronic since first CVA in 2023)  COORDINATION: RAM testing impaired R UE and R LE    LOWER EXTREMITY MMT:    MMT Right Eval Left Eval  Hip flexion 3 3  Hip extension    Hip abduction    Hip adduction    Hip internal rotation    Hip external rotation    Knee flexion    Knee extension 4- at best  4+  Ankle dorsiflexion 3 3- at best   Ankle plantarflexion    Ankle inversion     Ankle eversion    (Blank rows = not tested)   TODAY'S TREATMENT:      GAIT: Gait pattern: step through pattern, decreased arm swing- Right, decreased arm swing- Left, decreased hip/knee flexion- Right, decreased hip/knee flexion- Left, decreased ankle dorsiflexion- Right, decreased ankle dorsiflexion- Left, trunk flexed, narrow BOS, poor foot clearance- Right, and poor foot clearance- Left Distance walked: 115' x 4  Assistive device utilized: Walker - 4 wheeled Level of assistance: Supervision Comments: Bilateral foot drop w/ ankle inversion right worse than left. Continued to trial AFOs to RLE. First tried [place] PLS AFO with a small heel wedge to see if it would help with knee hyperextension during stance. Ambulated approx. 60' to begin with, with pt reporting feeling like her  foot was coming out of her shoe with the heel wedge. Removed heel wedge and pt reporting feeling better without this. Pt does demonstrate improved foot clearance/decr foot clap with use of AFO and pt also able to improve gait speed. Also ambulated with R [place] PLS AFO without heel wedge. Pt preferring this one as it felt easier to lift up and she did not feel as fatigued. Seated rest break needed after each bout of gait.   ModA for donning/doffing AFOs.                                                                                                                  Therapeutic Exercise: Seated hip ABD 2 x 10 reps with red tband, visual cues for AROM with RLE  Marching x10 reps with red tband   PATIENT EDUCATION: Education details: Getting rid of bridges for HEP and adding seated resistance hip ABD instead, process of obtaining an AFO and PT going to reach out to pt's PCP regarding this. Educated on purpose of heel lift to help with knee hyperextension. Continue walking at home and gradually incr distance as able to gradually build up endurance.  Person educated: Patient Education method: Explanation, Verbal cues,  and Handouts Education comprehension: verbalized understanding, returned demonstration, and needs further education  HOME EXERCISE PROGRAM: Access Code: [email or web address] URL: [email or web address] Date: 03/03/2023 Prepared by: Sherlie Ban  Exercises - Seated Heel Toe Raises  - 2 x daily - 7 x weekly - 1-2 sets - 10 reps - Seated March  - 2 x daily - 7 x weekly - 2 sets - 10 reps - Sit to Stand with Armchair  - 2 x daily - 7 x weekly - 2 sets - 5 reps - Seated Long Arc Quad (Mirrored)  - 2 x daily - 7 x weekly - 2 sets - 10 reps - Seated Hip Abduction with Resistance  - 1 x daily - 7 x weekly - 2 sets - 10 reps  GOALS: Goals reviewed with patient? No  SHORT TERM GOALS: Target date: 03/12/2023    Will be compliant with appropriate progressive HEP  Baseline: Goal status: INITIAL  2.  Will be able to ambulate 135ft with LRAD with no rest breaks and  RPE no more than 2/10 Baseline:  Goal status: INITIAL  3.  Will trial appropriate AFOs vs Foot Up brace to address B foot drop/improve gait pattern and energy conservation Baseline:  Goal status: INITIAL  4.  Will be able to name 3 ways to reduce fall risk at home and in the community  Baseline:  Goal status: INITIAL    LONG TERM GOALS: Target date: 04/02/2023    MMT to improve by 1 grade in all weak groups  Baseline:  Goal status: INITIAL  2.  Will complete 5x STS test in 25 seconds or less with UEs to show improved functional transfers/functional strength  Baseline:  Goal status: INITIAL  3.  Will complete TUG test in 15 seconds or less with LRAD  to show improved functional balance skills  Baseline:  Goal status: INITIAL  4.  Will be able to ambulate 230ft with LRAD with no rest breaks and RPE no more than 3/10 to improve community access  Baseline:  Goal status: INITIAL    ASSESSMENT:  CLINICAL IMPRESSION: Today's skilled session continued to focus on trialing AFOs. Also tried heel lift to RLE in  addition to AFO to help better control knee mechanics. However, pt reporting that it did not feel comfortable and she felt like her R foot was coming out of her shoe. Educated pt might need to wear a slightly bigger pair of shoes for this. Overall, pt did demo improvement in gait mechanics and speed with use of R Ott[place]ock PLS AFO. PT to reach out to pt's PCP for referral for this. Will continue per POC.    OBJECTIVE IMPAIRMENTS: Abnormal gait, cardiopulmonary status limiting activity, decreased activity tolerance, decreased balance, decreased coordination, decreased mobility, difficulty walking, decreased strength, impaired sensation, impaired vision/preception, and pain.   ACTIVITY LIMITATIONS: standing, stairs, transfers, bed mobility, and locomotion level  PARTICIPATION LIMITATIONS: shopping, community activity, yard work, and church  PERSONAL FACTORS: Age, Behavior pattern, Education, Fitness, Past/current experiences, Social background, and Time since onset of injury/illness/exacerbation are also affecting patient's functional outcome.   REHAB POTENTIAL: Fair hx of recurrent strokes, hx of high no-show rate  CLINICAL DECISION MAKING: Evolving/moderate complexity  EVALUATION COMPLEXITY: Moderate  PLAN:  PT FREQUENCY: 2x/week  PT DURATION: 6 weeks  PLANNED INTERVENTIONS: Therapeutic exercises, Therapeutic activity, Neuromuscular re-education, Balance training, Gait training, Patient/Family education, Self Care, Stair training, Orthotic/Fit training, DME instructions, Aquatic Therapy, and Re-evaluation  PLAN FOR NEXT SESSION: continue practicing gait with AFOs. Work on [employer] focus on the functional- strength/balance/safety/functional activity tolerance.   Sherlie Ban, PT, [name] 03/03/23 1:30 PM

## 2023-03-03 NOTE — Telephone Encounter (Signed)
Hi what is an AFO???

## 2023-03-03 NOTE — Telephone Encounter (Signed)
Dr. Delford Field, Stacey Lucas is being treated by physical therapy s/p L pontine CVA.  She will benefit from use of R AFO in order to improve safety with functional mobility and decr fall risk due to weakness/gait abnormalities   If you agree, please submit request in EPIC under MD Order, Other Orders (list R AFO in comments) or fax to [facility] at [phone].   In order for insurance to cover this, she will also need documentation in a physician's note on why she would need one. Would you be able to addend your note from 02/04/23 to include the need for a R AFO due to weakness and to decr fall risk?   Thank you so much, Stacey Lucas, PT, [name] 03/03/23 1:39 PM     [facility] [address] [place], [place]  [address] Phone:  [phone] Fax:  [phone]

## 2023-03-04 ENCOUNTER — Other Ambulatory Visit: Payer: Self-pay | Admitting: *Deleted

## 2023-03-04 DIAGNOSIS — F332 Major depressive disorder, recurrent severe without psychotic features: Secondary | ICD-10-CM | POA: Diagnosis not present

## 2023-03-04 DIAGNOSIS — N186 End stage renal disease: Secondary | ICD-10-CM

## 2023-03-04 DIAGNOSIS — E119 Type 2 diabetes mellitus without complications: Secondary | ICD-10-CM | POA: Diagnosis not present

## 2023-03-04 DIAGNOSIS — I639 Cerebral infarction, unspecified: Secondary | ICD-10-CM | POA: Diagnosis not present

## 2023-03-05 ENCOUNTER — Telehealth: Payer: Self-pay

## 2023-03-05 ENCOUNTER — Telehealth: Payer: Self-pay | Admitting: Physical Therapy

## 2023-03-05 DIAGNOSIS — I639 Cerebral infarction, unspecified: Secondary | ICD-10-CM

## 2023-03-05 DIAGNOSIS — E119 Type 2 diabetes mellitus without complications: Secondary | ICD-10-CM | POA: Diagnosis not present

## 2023-03-05 DIAGNOSIS — F332 Major depressive disorder, recurrent severe without psychotic features: Secondary | ICD-10-CM | POA: Diagnosis not present

## 2023-03-05 NOTE — Telephone Encounter (Signed)
Referral placed to Managed Medicaid Care Management  

## 2023-03-05 NOTE — Telephone Encounter (Signed)
It is a brace that goes in the shoe to help with foot clearance due to weakness! Can help clear feet better when walking to improve gait.

## 2023-03-05 NOTE — Telephone Encounter (Signed)
  It is a brace that goes in the shoe to help with foot clearance due to weakness! Can help clear feet better when walking to improve gait.   Sherlie Ban, PT, [name] 03/05/23 3:07 PM

## 2023-03-05 NOTE — Telephone Encounter (Signed)
I spoke to the patient and her caregiver, Quincy Sheehan.  He explained that he is trying to care for her as well as hold down a job and it has become very stressful and he is overwhelmed at times.  They are interested in caregiver support group/information.  I explained that I can refer her to Managed Medicaid Care Management for caregiver resources. Both the patient and Clifton Custard were very appreciative.  Referral then placed as stated

## 2023-03-06 ENCOUNTER — Ambulatory Visit: Payer: 59 | Admitting: Physical Therapy

## 2023-03-06 ENCOUNTER — Telehealth: Payer: Self-pay | Admitting: Physical Therapy

## 2023-03-06 DIAGNOSIS — F332 Major depressive disorder, recurrent severe without psychotic features: Secondary | ICD-10-CM | POA: Diagnosis not present

## 2023-03-06 DIAGNOSIS — I639 Cerebral infarction, unspecified: Secondary | ICD-10-CM | POA: Diagnosis not present

## 2023-03-06 DIAGNOSIS — E119 Type 2 diabetes mellitus without complications: Secondary | ICD-10-CM | POA: Diagnosis not present

## 2023-03-06 DIAGNOSIS — I69398 Other sequelae of cerebral infarction: Secondary | ICD-10-CM | POA: Insufficient documentation

## 2023-03-06 NOTE — Telephone Encounter (Signed)
I have alredy done this

## 2023-03-06 NOTE — Telephone Encounter (Signed)
Order was entered for physical therapy [name] please follow-up see comments and we can fax the order to [facility] see fax number in note

## 2023-03-06 NOTE — Assessment & Plan Note (Signed)
Physical therapy referral was sent patient would benefit from some type of support of the right foot due to foot dragging

## 2023-03-06 NOTE — Telephone Encounter (Signed)
Called pt about no show appt today. Had to leave voicemail and reminded pt of next appt.  Sherlie Ban, PT, [name] 03/06/23 11:45 AM   [facility] [address] [place], [place]  [address] Phone:  [phone] Fax:  [phone]

## 2023-03-07 DIAGNOSIS — E119 Type 2 diabetes mellitus without complications: Secondary | ICD-10-CM | POA: Diagnosis not present

## 2023-03-07 DIAGNOSIS — F332 Major depressive disorder, recurrent severe without psychotic features: Secondary | ICD-10-CM | POA: Diagnosis not present

## 2023-03-07 DIAGNOSIS — I639 Cerebral infarction, unspecified: Secondary | ICD-10-CM | POA: Diagnosis not present

## 2023-03-07 NOTE — Telephone Encounter (Signed)
noted 

## 2023-03-07 NOTE — Telephone Encounter (Signed)
Yes, thank you I got the order and saw his note.   Thanks, Sherlie Ban, PT, [name] 03/07/23 7:52 AM

## 2023-03-08 DIAGNOSIS — I639 Cerebral infarction, unspecified: Secondary | ICD-10-CM | POA: Diagnosis not present

## 2023-03-08 DIAGNOSIS — E119 Type 2 diabetes mellitus without complications: Secondary | ICD-10-CM | POA: Diagnosis not present

## 2023-03-08 DIAGNOSIS — F332 Major depressive disorder, recurrent severe without psychotic features: Secondary | ICD-10-CM | POA: Diagnosis not present

## 2023-03-09 DIAGNOSIS — F332 Major depressive disorder, recurrent severe without psychotic features: Secondary | ICD-10-CM | POA: Diagnosis not present

## 2023-03-09 DIAGNOSIS — I639 Cerebral infarction, unspecified: Secondary | ICD-10-CM | POA: Diagnosis not present

## 2023-03-09 DIAGNOSIS — E119 Type 2 diabetes mellitus without complications: Secondary | ICD-10-CM | POA: Diagnosis not present

## 2023-03-10 ENCOUNTER — Emergency Department (HOSPITAL_COMMUNITY)
Admission: EM | Admit: 2023-03-10 | Discharge: 2023-03-10 | Disposition: A | Discharge status: Home / Self Care | Payer: 59 | Attending: Emergency Medicine | Admitting: Emergency Medicine

## 2023-03-10 ENCOUNTER — Other Ambulatory Visit: Payer: Self-pay

## 2023-03-10 ENCOUNTER — Emergency Department (HOSPITAL_COMMUNITY): Payer: 59

## 2023-03-10 ENCOUNTER — Encounter (HOSPITAL_COMMUNITY): Payer: Self-pay

## 2023-03-10 ENCOUNTER — Ambulatory Visit: Payer: 59 | Admitting: Physical Therapy

## 2023-03-10 DIAGNOSIS — E1122 Type 2 diabetes mellitus with diabetic chronic kidney disease: Secondary | ICD-10-CM | POA: Insufficient documentation

## 2023-03-10 DIAGNOSIS — I120 Hypertensive chronic kidney disease with stage 5 chronic kidney disease or end stage renal disease: Secondary | ICD-10-CM | POA: Diagnosis not present

## 2023-03-10 DIAGNOSIS — I639 Cerebral infarction, unspecified: Secondary | ICD-10-CM | POA: Diagnosis not present

## 2023-03-10 DIAGNOSIS — M545 Low back pain, unspecified: Secondary | ICD-10-CM | POA: Insufficient documentation

## 2023-03-10 DIAGNOSIS — M5136 Other intervertebral disc degeneration, lumbar region: Secondary | ICD-10-CM | POA: Diagnosis not present

## 2023-03-10 DIAGNOSIS — Z79899 Other long term (current) drug therapy: Secondary | ICD-10-CM | POA: Insufficient documentation

## 2023-03-10 DIAGNOSIS — E119 Type 2 diabetes mellitus without complications: Secondary | ICD-10-CM | POA: Diagnosis not present

## 2023-03-10 DIAGNOSIS — R110 Nausea: Secondary | ICD-10-CM | POA: Insufficient documentation

## 2023-03-10 DIAGNOSIS — Z7901 Long term (current) use of anticoagulants: Secondary | ICD-10-CM | POA: Insufficient documentation

## 2023-03-10 DIAGNOSIS — R079 Chest pain, unspecified: Secondary | ICD-10-CM | POA: Diagnosis not present

## 2023-03-10 DIAGNOSIS — K529 Noninfective gastroenteritis and colitis, unspecified: Secondary | ICD-10-CM

## 2023-03-10 DIAGNOSIS — N186 End stage renal disease: Secondary | ICD-10-CM | POA: Insufficient documentation

## 2023-03-10 DIAGNOSIS — R1084 Generalized abdominal pain: Secondary | ICD-10-CM | POA: Diagnosis not present

## 2023-03-10 DIAGNOSIS — F332 Major depressive disorder, recurrent severe without psychotic features: Secondary | ICD-10-CM | POA: Diagnosis not present

## 2023-03-10 DIAGNOSIS — R197 Diarrhea, unspecified: Secondary | ICD-10-CM | POA: Diagnosis not present

## 2023-03-10 DIAGNOSIS — R109 Unspecified abdominal pain: Secondary | ICD-10-CM | POA: Diagnosis not present

## 2023-03-10 LAB — CBC
HCT: 29 % — ABNORMAL LOW (ref 36.0–46.0)
Hemoglobin: 9.5 g/dL — ABNORMAL LOW (ref 12.0–15.0)
MCH: 24.7 pg — ABNORMAL LOW (ref 26.0–34.0)
MCHC: 32.8 g/dL (ref 30.0–36.0)
MCV: 75.3 fL — ABNORMAL LOW (ref 80.0–100.0)
Platelets: 257 10*3/uL (ref 150–400)
RBC: 3.85 MIL/uL — ABNORMAL LOW (ref 3.87–5.11)
RDW: 17 % — ABNORMAL HIGH (ref 11.5–15.5)
WBC: 10.2 10*3/uL (ref 4.0–10.5)
nRBC: 0 % (ref 0.0–0.2)

## 2023-03-10 LAB — COMPREHENSIVE METABOLIC PANEL
ALT: 9 U/L (ref 0–44)
AST: 12 U/L — ABNORMAL LOW (ref 15–41)
Albumin: 1.7 g/dL — ABNORMAL LOW (ref 3.5–5.0)
Alkaline Phosphatase: 57 U/L (ref 38–126)
Anion gap: 17 — ABNORMAL HIGH (ref 5–15)
BUN: 67 mg/dL — ABNORMAL HIGH (ref 6–20)
CO2: 16 mmol/L — ABNORMAL LOW (ref 22–32)
Calcium: 8.3 mg/dL — ABNORMAL LOW (ref 8.9–10.3)
Chloride: 109 mmol/L (ref 98–111)
Creatinine, Ser: 9.33 mg/dL — ABNORMAL HIGH (ref 0.44–1.00)
GFR, Estimated: 5 mL/min — ABNORMAL LOW (ref >60)
Glucose, Bld: 138 mg/dL — ABNORMAL HIGH (ref 70–99)
Potassium: 4.3 mmol/L (ref 3.5–5.1)
Sodium: 142 mmol/L (ref 135–145)
Total Bilirubin: 0.4 mg/dL (ref 0.3–1.2)
Total Protein: 5.3 g/dL — ABNORMAL LOW (ref 6.5–8.1)

## 2023-03-10 LAB — TROPONIN I (HIGH SENSITIVITY)
Troponin I (High Sensitivity): 14 ng/L (ref <18)
Troponin I (High Sensitivity): 13 ng/L (ref <18)

## 2023-03-10 LAB — LIPASE, BLOOD: Lipase: 24 U/L (ref 11–51)

## 2023-03-10 MED ORDER — OXYCODONE HCL 5 MG PO TABS
10.0000 mg | ORAL_TABLET | Freq: Once | ORAL | Status: AC
  Administered 2023-03-10: 10 mg via ORAL
  Filled 2023-03-10: qty 2

## 2023-03-10 MED ORDER — DICYCLOMINE HCL 10 MG PO CAPS
10.0000 mg | ORAL_CAPSULE | Freq: Once | ORAL | Status: AC
  Administered 2023-03-10: 10 mg via ORAL
  Filled 2023-03-10: qty 1

## 2023-03-10 MED ORDER — ACETAMINOPHEN 500 MG PO TABS
1000.0000 mg | ORAL_TABLET | Freq: Once | ORAL | Status: AC
  Administered 2023-03-10: 1000 mg via ORAL
  Filled 2023-03-10: qty 2

## 2023-03-10 MED ORDER — ONDANSETRON HCL 4 MG PO TABS: 4.0000 mg | ORAL_TABLET | Freq: Three times a day (TID) | ORAL | 0 refills | Status: DC | PRN

## 2023-03-10 NOTE — ED Triage Notes (Addendum)
Pt BIB [facility] from home d/t abd pain, nausea & diarrhea the past 3 days. Family on scene gave information about pt to EMS d/t pt being uncooperative & not wanting to speak & being a poor historian. EMS also reports she is supposed to start dialysis soon & has not had her forts Tx yet. Pt also reports she still smokes cigarettes & each of them make her feel sick. 126/72, CBG 111, 100% RA, 78 bpm, A/Ox4. While in triage pt c/o CP but says "it's all right" when asked about where it hurts & how bad.

## 2023-03-10 NOTE — Discharge Instructions (Signed)
Thank you for letting us take care of you today.  We gave you medications to help with your pain.  Your kidney function is more elevated than normal.  We discussed this with the kidney doctors and they think that you should go to her appointment on Wednesday to have access placed for dialysis and then start dialysis in the outpatient clinics.  I prescribed a medication to help with nausea at home until you can follow-up.  It is very important that you keep this appointment and start dialysis to prevent further worsening of your kidney function.  If you do not currently have a PCP, provided 2 clinics that you may follow-up with.  Otherwise, follow-up with your current kidney doctor.  For new or worsening symptoms, return to the nearest ED for reevaluation.

## 2023-03-10 NOTE — ED Notes (Signed)
Unable to obtian urine. Patient states she does not have to urinate. Will retry at later time

## 2023-03-10 NOTE — ED Provider Notes (Signed)
EMERGENCY DEPARTMENT AT [facility] Provider Note   CSN: [id number] Arrival date & time: 03/10/23  1644     History  Chief Complaint  Patient presents with   Abdominal Pain   Nausea   Diarrhea    Stacey Lucas is a 47 y.o. female with past medical history diabetes, GERD, hypertension, hyperlipidemia who presents to the ED complaining of generalized abdominal discomfort, nausea, and diarrhea.  Denies hematochezia or melena.  States that she has had too many episodes of diarrhea for the last 3 days to count.  No known sick contacts.  No spoiled foods.  Associated nausea but no vomiting.  Denies chest pain to me but did complain of this to nursing staff.  Denies shortness of breath, cough, or congestion.  States that she is supposed to start dialysis soon and is scheduled to have access placement in 2 days.  Has never previously been on dialysis.  Does also complain of some pain in her lower back.  No fall or injury.  No bowel or bladder dysfunction, paresthesias, or leg weakness.  Denies taking any medication at home for symptoms before arrival.    Home Medications Prior to Admission medications   Medication Sig Start Date End Date Taking? Authorizing Provider  ondansetron (ZOFRAN) 4 MG tablet Take 1 tablet (4 mg total) by mouth every 8 (eight) hours as needed for up to 3 days for nausea or vomiting. 03/10/23 03/13/23 Yes Juvencio Verdi L, PA-C  Accu-Chek Softclix Lancets lancets SMARTSIG:Topical 1-4 Times Daily 01/31/22   [provider]  amLODipine (NORVASC) 10 MG tablet Take 1 tablet (10 mg total) by mouth daily. 02/04/23   Storm Frisk, MD  apixaban (ELIQUIS) 2.5 MG TABS tablet Take 1 tablet (2.5 mg total) by mouth 2 (two) times daily. 12/03/22   Storm Frisk, MD  atorvastatin (LIPITOR) 80 MG tablet Take 1 tablet (80 mg total) by mouth daily. 12/03/22   Storm Frisk, MD  azelastine (OPTIVAR) 0.05 % ophthalmic solution Place 1 drop into both eyes 2  (two) times daily. 08/21/22   Waldon Merl, PA-C  blood glucose meter kit and supplies KIT Dispense based on patient and insurance preference. Use up to four times daily as directed. 01/31/22   Fanny Dance, MD  carvedilol (COREG) 6.25 MG tablet Take 0.5 tablets (3.125 mg total) by mouth 2 (two) times daily with a meal. 02/04/23 03/06/23  Storm Frisk, MD  Darbepoetin Alfa (ARANESP) 60 MCG/0.3ML SOSY injection Inject 0.3 mLs (60 mcg total) into the skin every Saturday at 6 PM. 11/23/22   Willette Cluster, MD      Allergies    Gabapentin    Review of Systems   Review of Systems  All other systems reviewed and are negative.   Physical Exam Updated Vital Signs BP 136/89 (BP Location: Left Arm)   Pulse 71   Temp 97.7 F (36.5 C) (Oral)   Resp 18   LMP 10/11/2019   SpO2 100%  Physical Exam Vitals and nursing note reviewed.  Constitutional:      General: She is not in acute distress.    Appearance: Normal appearance. She is not ill-appearing, toxic-appearing or diaphoretic.  HENT:     Head: Normocephalic and atraumatic.     Mouth/Throat:     Mouth: Mucous membranes are moist.  Eyes:     Conjunctiva/sclera: Conjunctivae normal.  Cardiovascular:     Rate and Rhythm: Normal rate and regular rhythm.  Heart sounds: No murmur heard. Pulmonary:     Effort: Pulmonary effort is normal.     Breath sounds: Normal breath sounds.  Abdominal:     General: Abdomen is flat.     Palpations: Abdomen is soft.     Tenderness: There is generalized abdominal tenderness. There is no right CVA tenderness, left CVA tenderness, guarding or rebound.  Musculoskeletal:        General: Normal range of motion.     Cervical back: Neck supple.     Right lower leg: No edema.     Left lower leg: No edema.     Comments: No midline CTL spinal tenderness, step-offs, or deformities  Skin:    General: Skin is warm and dry.     Capillary Refill: Capillary refill takes less than 2 seconds.   Neurological:     General: No focal deficit present.     Mental Status: She is alert and oriented to person, place, and time. Mental status is at baseline.  Psychiatric:        Mood and Affect: Mood normal.        Behavior: Behavior normal.     ED Results / Procedures / Treatments   Labs (all labs ordered are listed, but only abnormal results are displayed) Labs Reviewed  COMPREHENSIVE METABOLIC PANEL - Abnormal; Notable for the following components:      Result Value   CO2 16 (*)    Glucose, Bld 138 (*)    BUN 67 (*)    Creatinine, Ser 9.33 (*)    Calcium 8.3 (*)    Total Protein 5.3 (*)    Albumin 1.7 (*)    AST 12 (*)    GFR, Estimated 5 (*)    Anion gap 17 (*)    All other components within normal limits  CBC - Abnormal; Notable for the following components:   RBC 3.85 (*)    Hemoglobin 9.5 (*)    HCT 29.0 (*)    MCV 75.3 (*)    MCH 24.7 (*)    RDW 17.0 (*)    All other components within normal limits  LIPASE, BLOOD  URINALYSIS, ROUTINE W REFLEX MICROSCOPIC  RAPID URINE DRUG SCREEN, HOSP PERFORMED  TROPONIN I (HIGH SENSITIVITY)  TROPONIN I (HIGH SENSITIVITY)    EKG None  Radiology CT ABDOMEN PELVIS WO CONTRAST  Result Date: 03/10/2023 CLINICAL DATA:  Acute abdominal pain, nonlocalized. EXAM: CT ABDOMEN AND PELVIS WITHOUT CONTRAST TECHNIQUE: Multidetector CT imaging of the abdomen and pelvis was performed following the standard protocol without IV contrast. RADIATION DOSE REDUCTION: This exam was performed according to the departmental dose-optimization program which includes automated exposure control, adjustment of the mA and/or kV according to patient size and/or use of iterative reconstruction technique. COMPARISON:  CT examination dated October 27, 2019 FINDINGS: Lower chest: No acute abnormality. Hepatobiliary: No focal liver abnormality is seen. No gallstones, gallbladder wall thickening, or biliary dilatation. Pancreas: Unremarkable. No pancreatic ductal  dilatation or surrounding inflammatory changes. Spleen: Normal in size without focal abnormality. Adrenals/Urinary Tract: Adrenal glands are unremarkable. Kidneys are normal, without renal calculi, focal lesion, or hydronephrosis. Bladder is unremarkable. Stomach/Bowel: Stomach is within normal limits. Appendix appears normal. There is marked thickening of the ascending and transverse colonic wall with adjacent fat stranding concerning for moderate-to-severe colitis. No extraluminal free air or abdominal collection. Vascular/Lymphatic: No significant vascular findings are present. No enlarged abdominal or pelvic lymph nodes. Reproductive: Uterus and bilateral adnexa are unremarkable. Other: No  abdominal wall hernia or abnormality. No abdominopelvic ascites. Musculoskeletal: Mild degenerate disc disease of the lumbar spine. No acute osseous abnormality. IMPRESSION: 1. Marked thickening of the ascending and transverse colonic wall with adjacent fat stranding concerning for moderate-to-severe colitis. No evidence of bowel obstruction. 2. Normal appendix. 3. No evidence of nephrolithiasis or hydronephrosis. 4. Uterus and adnexa are unremarkable. 5. Mild degenerate disc disease of the lumbar spine. Electronically Signed   By: Larose Hires D.O.   On: 03/10/2023 18:16   DG Chest 2 View  Result Date: 03/10/2023 CLINICAL DATA:  Chest pain EXAM: CHEST - 2 VIEW COMPARISON:  Radiographs dated February 20, 2023 FINDINGS: The heart size and mediastinal contours are within normal limits. Both lungs are clear. The visualized skeletal structures are unremarkable. IMPRESSION: No active cardiopulmonary disease. Electronically Signed   By: Larose Hires D.O.   On: 03/10/2023 18:10    Procedures Procedures    Medications Ordered in ED Medications  oxyCODONE (Oxy IR/ROXICODONE) immediate release tablet 10 mg (10 mg Oral Given 03/10/23 2030)  acetaminophen (TYLENOL) tablet 1,000 mg (1,000 mg Oral Given 03/10/23 2030)  dicyclomine  (BENTYL) capsule 10 mg (10 mg Oral Given 03/10/23 2030)  oxyCODONE (Oxy IR/ROXICODONE) immediate release tablet 10 mg (10 mg Oral Given 03/10/23 2229)    ED Course/ Medical Decision Making/ A&P                             Medical Decision Making Amount and/or Complexity of Data Reviewed Labs: ordered. Decision-making details documented in ED Course. Radiology: ordered. Decision-making details documented in ED Course. ECG/medicine tests: ordered. Decision-making details documented in ED Course.  Risk OTC drugs. Prescription drug management.   Medical Decision Making:   Stacey Lucas is a 47 y.o. female who presented to the ED today with abdominal pain detailed above.    Patient's presentation is complicated by their history of hypertension, hyperlipidemia, CKD, diabetes, substance abuse.  Complete initial physical exam performed, notably the patient  was in no acute distress.  She had mild generalized abdominal tenderness.  No CVA tenderness.  Neurologically intact.    Reviewed and confirmed nursing documentation for past medical history, family history, social history.    Initial Assessment:   With the patient's presentation of abdominal pain, differential diagnosis includes but is not limited to AAA, mesenteric ischemia, appendicitis, diverticulitis, DKA, gastritis, gastroenteritis, AMI, nephrolithiasis, pancreatitis, peritonitis, adrenal insufficiency, intestinal ischemia, constipation, UTI, SBO/LBO, splenic rupture, biliary disease, IBD, IBS, PUD, hepatitis, STD, ovarian/testicular torsion, electrolyte disturbance, DKA, dehydration, acute kidney injury, renal failure, cholecystitis, cholelithiasis, choledocholithiasis, abdominal pain of  unknown etiology.   Initial Plan:  Screening labs including CBC and Metabolic panel to evaluate for infectious or metabolic etiology of disease.  Lipase to evaluate for pancreatitis Urinalysis with reflex culture ordered to evaluate for UTI or relevant  urologic/nephrologic pathology.  CT abd/pelvis to evaluate for intra-abdominal pathology EKG to evaluate for cardiac pathology Chest x-ray to evaluate for intrathoracic pathology Symptomatic management Objective evaluation as reviewed   Initial Study Results:   Laboratory  All laboratory results reviewed without evidence of clinically relevant pathology.   Exceptions include: Creatinine 9.33, calcium 8.3, albumin 1.7, AST 12, hemoglobin 9.5  EKG EKG was reviewed independently. NSR without acute ST-T changes.    Radiology:  All images reviewed independently. Agree with radiology report at this time.   CT ABDOMEN PELVIS WO CONTRAST  Result Date: 03/10/2023 CLINICAL DATA:  Acute abdominal pain, nonlocalized.  EXAM: CT ABDOMEN AND PELVIS WITHOUT CONTRAST TECHNIQUE: Multidetector CT imaging of the abdomen and pelvis was performed following the standard protocol without IV contrast. RADIATION DOSE REDUCTION: This exam was performed according to the departmental dose-optimization program which includes automated exposure control, adjustment of the mA and/or kV according to patient size and/or use of iterative reconstruction technique. COMPARISON:  CT examination dated October 27, 2019 FINDINGS: Lower chest: No acute abnormality. Hepatobiliary: No focal liver abnormality is seen. No gallstones, gallbladder wall thickening, or biliary dilatation. Pancreas: Unremarkable. No pancreatic ductal dilatation or surrounding inflammatory changes. Spleen: Normal in size without focal abnormality. Adrenals/Urinary Tract: Adrenal glands are unremarkable. Kidneys are normal, without renal calculi, focal lesion, or hydronephrosis. Bladder is unremarkable. Stomach/Bowel: Stomach is within normal limits. Appendix appears normal. There is marked thickening of the ascending and transverse colonic wall with adjacent fat stranding concerning for moderate-to-severe colitis. No extraluminal free air or abdominal collection.  Vascular/Lymphatic: No significant vascular findings are present. No enlarged abdominal or pelvic lymph nodes. Reproductive: Uterus and bilateral adnexa are unremarkable. Other: No abdominal wall hernia or abnormality. No abdominopelvic ascites. Musculoskeletal: Mild degenerate disc disease of the lumbar spine. No acute osseous abnormality. IMPRESSION: 1. Marked thickening of the ascending and transverse colonic wall with adjacent fat stranding concerning for moderate-to-severe colitis. No evidence of bowel obstruction. 2. Normal appendix. 3. No evidence of nephrolithiasis or hydronephrosis. 4. Uterus and adnexa are unremarkable. 5. Mild degenerate disc disease of the lumbar spine. Electronically Signed   By: Larose Hires D.O.   On: 03/10/2023 18:16   DG Chest 2 View  Result Date: 03/10/2023 CLINICAL DATA:  Chest pain EXAM: CHEST - 2 VIEW COMPARISON:  Radiographs dated February 20, 2023 FINDINGS: The heart size and mediastinal contours are within normal limits. Both lungs are clear. The visualized skeletal structures are unremarkable. IMPRESSION: No active cardiopulmonary disease. Electronically Signed   By: Larose Hires D.O.   On: 03/10/2023 18:10   DG Chest 1 View  Result Date: 02/20/2023 CLINICAL DATA:  [id number] Chest pain [id number] EXAM: CHEST  1 VIEW COMPARISON:  CXR 11/16/22 FINDINGS: The heart size and mediastinal contours are within normal limits. Both lungs are clear. The visualized skeletal structures are unremarkable. IMPRESSION: No focal airspace opacity Electronically Signed   By: Lorenza Cambridge M.D.   On: 02/20/2023 18:39      Consults: Case discussed with Dr. Allena Katz with nephrology who stated that the bump in patient's creatinine is expected with her symptoms and she is able to be discharged home with follow-up for dialysis access placement in 2 days to start dialysis.  Final Assessment and Plan:   47 year old female presents to the ED with complaints of diarrhea, abdominal pain, and back pain.   Mostly complaining of lower back pain.  Does not make urine anymore due to history of end-stage renal disease.  States that she has had many episodes of diarrhea.  Associated nausea but no vomiting.  Workup initiated as above for further assessment.  Chest x-ray without focal airspace disease.  Findings of colitis on CT scan but no obstruction.  Patient has not had any diarrhea since arrival to the ED.  No active emesis either.  She does have some improvement of back pain with medications given but still some residual pain.  Kidney function is bumped with a creatinine of 9.33.  Abdomen soft and nontender on reexam.  With bump to creatinine, discussed case with attending physician who cosigned this note who agreed that we should  consult nephrology further recommendations given that patient has not yet started dialysis.  She is scheduled to have access placed in 2 days and start dialysis then.  Discussed with nephrology who stated that bump in creatinine is expected with patient symptoms and she can follow-up outpatient.  Extensive discussion with patient and family member at bedside on the importance of keeping this follow-up and initiating dialysis.  Patient did become tearful during this conversation but did express understanding and agreement with plan and reiterates that she plans on following up closely.  Troponins normal x 2.  No acute EKG changes.  Suspect viral syndrome contributing to colitis but patient is tolerating p.o.  Will prescribe medication for nausea at home as needed.  Strict ED return precautions given, all questions answered, and stable for discharge.   Clinical Impression:  1. Diarrhea, unspecified type   2. Nausea   3. End stage renal disease (HCC)   4. Acute bilateral low back pain without sciatica      Discharge           Final Clinical Impression(s) / ED Diagnoses Final diagnoses:  Diarrhea, unspecified type  Nausea  End stage renal disease (HCC)  Acute bilateral low  back pain without sciatica    Rx / DC Orders ED Discharge Orders          Ordered    ondansetron (ZOFRAN) 4 MG tablet  Every 8 hours PRN        03/10/23 2226              Tonette Lederer, PA-C 03/10/23 2235    Loetta Rough, MD 03/11/23 [phone]

## 2023-03-11 ENCOUNTER — Telehealth: Payer: Self-pay

## 2023-03-11 ENCOUNTER — Other Ambulatory Visit: Payer: Medicaid Other | Admitting: Licensed Clinical Social Worker

## 2023-03-11 DIAGNOSIS — I639 Cerebral infarction, unspecified: Secondary | ICD-10-CM | POA: Diagnosis not present

## 2023-03-11 DIAGNOSIS — F332 Major depressive disorder, recurrent severe without psychotic features: Secondary | ICD-10-CM | POA: Diagnosis not present

## 2023-03-11 DIAGNOSIS — E119 Type 2 diabetes mellitus without complications: Secondary | ICD-10-CM | POA: Diagnosis not present

## 2023-03-11 NOTE — Patient Outreach (Addendum)
Care Coordination  03/11/2023  Stacey Lucas [birth year] [id number]  [facility] LCSW was unable to reach patient/family successfully by phone during scheduled appointment time but was able to reach pt later that day and reschedule initial visit for patient with [facility] LCSW and [facility] RNCM.  [facility] LCSW will update [facility] RNCM on new referral. [facility] LCSW will complete assessment next week during rescheduled initial SW appointment.  Dickie La, BSW, MSW, [employer] LCSW [facility]  [facility] [email or web address] Phone: [phone]

## 2023-03-11 NOTE — Telephone Encounter (Signed)
I spoke to [facility] and she confirmed that the shower chair was delivered to the patient at the beginning of June.    She found the order for the rollator  with  the fax on 02/20/2023 and said it was overlooked, she will send it to her team to re-process and contact the patient.

## 2023-03-11 NOTE — Telephone Encounter (Signed)
Stacey Lucas from [facility] states unable to get the pt another walker/rolaor, because he has had one in 2022.

## 2023-03-12 ENCOUNTER — Ambulatory Visit (INDEPENDENT_AMBULATORY_CARE_PROVIDER_SITE_OTHER): Payer: 59 | Admitting: Vascular Surgery

## 2023-03-12 ENCOUNTER — Ambulatory Visit (INDEPENDENT_AMBULATORY_CARE_PROVIDER_SITE_OTHER)
Admission: RE | Admit: 2023-03-12 | Discharge: 2023-03-12 | Disposition: A | Discharge status: Home / Self Care | Payer: 59 | Source: Ambulatory Visit | Attending: Vascular Surgery | Admitting: Vascular Surgery

## 2023-03-12 ENCOUNTER — Ambulatory Visit: Payer: 59 | Attending: Physical Therapy | Admitting: Physical Therapy

## 2023-03-12 ENCOUNTER — Ambulatory Visit (HOSPITAL_COMMUNITY)
Admission: RE | Admit: 2023-03-12 | Discharge: 2023-03-12 | Disposition: A | Discharge status: Home / Self Care | Payer: 59 | Source: Ambulatory Visit | Attending: Vascular Surgery | Admitting: Vascular Surgery

## 2023-03-12 ENCOUNTER — Encounter: Payer: Self-pay | Admitting: Vascular Surgery

## 2023-03-12 VITALS — BP 163/88 | HR 93 | Temp 97.8°F | Resp 20 | Ht 65.0 in | Wt 142.0 lb

## 2023-03-12 DIAGNOSIS — N185 Chronic kidney disease, stage 5: Secondary | ICD-10-CM | POA: Diagnosis not present

## 2023-03-12 DIAGNOSIS — N186 End stage renal disease: Secondary | ICD-10-CM | POA: Insufficient documentation

## 2023-03-12 DIAGNOSIS — I639 Cerebral infarction, unspecified: Secondary | ICD-10-CM | POA: Diagnosis not present

## 2023-03-12 DIAGNOSIS — F332 Major depressive disorder, recurrent severe without psychotic features: Secondary | ICD-10-CM | POA: Diagnosis not present

## 2023-03-12 DIAGNOSIS — E119 Type 2 diabetes mellitus without complications: Secondary | ICD-10-CM | POA: Diagnosis not present

## 2023-03-12 NOTE — Progress Notes (Signed)
Patient ID: Stacey Lucas, female   DOB: [birth year], 47 y.o.   MRN: [id number]  Reason for Consult: New Patient (Initial Visit)   Referred by Storm Frisk, MD  Subjective:     HPI:  Stacey Lucas is a 47 y.o. female history of chronic kidney disease stage V.  She has a history of diabetes, hyperlipidemia and hypertension and stroke leaving her is weak on her right side but she remains right-hand dominant.  Her brother was on dialysis but deceased.  She is here today to discuss dialysis access.  She has never been on dialysis not currently requiring urgent access.  She walks with the help of a walker.  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 12/29/2021   Anxiety and depression    Cerebral thrombosis with cerebral infarction 05/24/2021   Cocaine abuse (HCC)    Depression with suicidal ideation    [facility] admission 04/2018   Diabetes mellitus without complication (HCC)    Type II   GERD (gastroesophageal reflux disease)    Hyperlipidemia    Hypertension    Impingement syndrome of right shoulder    Migraine headache    Osteoarthritis of right knee 05/24/2021   Pilonidal abscess 05/2013   Pyelonephritis    Right thyroid nodule    Sciatica    Family History  Problem Relation Age of Onset   Diabetes Mother    Hypertension Mother    Arthritis Mother    Diabetes Father    Hypertension Father    Past Surgical History:  Procedure Laterality Date   BUBBLE STUDY  01/01/2022   Procedure: BUBBLE STUDY;  Surgeon: Maisie Fus, MD;  Location: [facility] ENDOSCOPY;  Service: Cardiovascular;;   TEE WITHOUT CARDIOVERSION N/A 01/01/2022   Procedure: TRANSESOPHAGEAL ECHOCARDIOGRAM (TEE);  Surgeon: Maisie Fus, MD;  Location: [facility] ENDOSCOPY;  Service: Cardiovascular;  Laterality: N/A;   tubal      Short Social History:  Social History   Tobacco Use   Smoking status: Every Day     Packs/day: 1    Types: Cigarettes    Last attempt to quit: 01/16/2022    Years since quitting: 1.1    Passive exposure: Current   Smokeless tobacco: Never  Substance Use Topics   Alcohol use: Yes    Comment: occasionally    Allergies  Allergen Reactions   Gabapentin     Weakness/impairs balance    Current Outpatient Medications  Medication Sig Dispense Refill   Accu-Chek Softclix Lancets lancets SMARTSIG:Topical 1-4 Times Daily     amLODipine (NORVASC) 10 MG tablet Take 1 tablet (10 mg total) by mouth daily. 90 tablet 1   apixaban (ELIQUIS) 2.5 MG TABS tablet Take 1 tablet (2.5 mg total) by mouth 2 (two) times daily. 120 tablet 1   atorvastatin (LIPITOR) 80 MG tablet Take 1 tablet (80 mg total) by mouth daily. 90 tablet 2   azelastine (OPTIVAR) 0.05 % ophthalmic solution Place 1 drop into both eyes 2 (two) times daily. 6 mL 1   blood glucose meter kit and supplies KIT Dispense based on patient and insurance preference. Use up to four times daily as directed. 1 each 0   Darbepoetin Alfa (ARANESP) 60 MCG/0.3ML SOSY injection Inject 0.3 mLs (60 mcg total) into the skin every Saturday at 6 PM. 4.2 mL 0   ondansetron (ZOFRAN) 4 MG tablet Take 1 tablet (4 mg total) by  mouth every 8 (eight) hours as needed for up to 3 days for nausea or vomiting. 9 tablet 0   carvedilol (COREG) 6.25 MG tablet Take 0.5 tablets (3.125 mg total) by mouth 2 (two) times daily with a meal. 120 tablet 1   No current facility-administered medications for this visit.    Review of Systems  Constitutional:  Constitutional negative. HENT: HENT negative.  Eyes: Eyes negative.  Respiratory: Respiratory negative.  Cardiovascular: Cardiovascular negative.  GI: Gastrointestinal negative.  Musculoskeletal: Musculoskeletal negative.  Skin: Skin negative.  Neurological: Positive for focal weakness.  Hematologic: Hematologic/lymphatic negative.  Psychiatric: Psychiatric negative.        Objective:  Objective   Vitals:   03/12/23 1503  BP: (!) 163/88  Pulse: 93  Resp: 20  Temp: 97.8 F (36.6 C)  SpO2: 97%    Physical Exam HENT:     Head: Normocephalic.     Nose: Nose normal.  Eyes:     Pupils: Pupils are equal, round, and reactive to light.  Cardiovascular:     Pulses:          Radial pulses are 2+ on the right side and 2+ on the left side.  Pulmonary:     Effort: Pulmonary effort is normal.  Abdominal:     General: Abdomen is flat.     Palpations: Abdomen is soft.  Musculoskeletal:     Right lower leg: No edema.     Left lower leg: No edema.  Skin:    General: Skin is warm.     Capillary Refill: Capillary refill takes less than 2 seconds.  Neurological:     Mental Status: She is alert.     Motor: Weakness present.     Data: Right Pre-Dialysis Findings:  +-----------------------+----------+--------------------+---------+--------  +  Location              PSV (cm/s)Intralum. Diam. (cm)Waveform  Comments  +-----------------------+----------+--------------------+---------+--------  +  Brachial Antecub. fossa83        0.35                triphasic           +-----------------------+----------+--------------------+---------+--------  +  Radial Art at Wrist    64        0.15                triphasic           +-----------------------+----------+--------------------+---------+--------  +  Ulnar Art at Wrist     83        0.11                triphasic              Left Pre-Dialysis Findings:  +-----------------------+----------+--------------------+---------+--------  +  Location              PSV (cm/s)Intralum. Diam. (cm)Waveform  Comments  +-----------------------+----------+--------------------+---------+--------  +  Brachial Antecub. fossa90        0.36                triphasic           +-----------------------+----------+--------------------+---------+--------  +  Radial Art at Wrist    54        0.17                 triphasic           +-----------------------+----------+--------------------+---------+--------  +  Ulnar Art at Wrist     68  0.19                triphasic           +-----------------------+----------+--------------------+---------+--------  +   Right Cephalic   Diameter (cm)Depth (cm)   Findings     +-----------------+-------------+----------+--------------+  Shoulder            0.13                               +-----------------+-------------+----------+--------------+  Prox upper arm       0.14                               +-----------------+-------------+----------+--------------+  Mid upper arm                           not visualized  +-----------------+-------------+----------+--------------+  Dist upper arm       0.09                               +-----------------+-------------+----------+--------------+  Antecubital fossa 0.12 / 0.17                           +-----------------+-------------+----------+--------------+  Prox forearm         0.15                               +-----------------+-------------+----------+--------------+  Mid forearm          0.19                               +-----------------+-------------+----------+--------------+  Dist forearm         0.15                               +-----------------+-------------+----------+--------------+   +-----------------+-------------+----------+--------+  Right Basilic    Diameter (cm)Depth (cm)Findings  +-----------------+-------------+----------+--------+  Dist upper arm       0.26                         +-----------------+-------------+----------+--------+  Antecubital fossa 0.35 / 0.21                     +-----------------+-------------+----------+--------+  Prox forearm         0.17                         +-----------------+-------------+----------+--------+   +-----------------+-------------+----------+--------+   Left Cephalic    Diameter (cm)Depth (cm)Findings  +-----------------+-------------+----------+--------+  Shoulder            0.28                         +-----------------+-------------+----------+--------+  Prox upper arm    0.36 / 0.19                     +-----------------+-------------+----------+--------+  Mid upper arm        0.22                         +-----------------+-------------+----------+--------+  Dist upper arm       0.17                         +-----------------+-------------+----------+--------+  Antecubital fossa 0.14 / 0.23                     +-----------------+-------------+----------+--------+  Prox forearm         0.24                         +-----------------+-------------+----------+--------+  Mid forearm          0.19                         +-----------------+-------------+----------+--------+  Dist forearm         0.16                         +-----------------+-------------+----------+--------+   +-----------------+-------------+----------+--------+  Left Basilic     Diameter (cm)Depth (cm)Findings  +-----------------+-------------+----------+--------+  Mid upper arm        0.41                joins    +-----------------+-------------+----------+--------+  Dist upper arm    0.30 / 0.25                     +-----------------+-------------+----------+--------+  Antecubital fossa 0.25 / 0.30                     +-----------------+-------------+----------+--------+  Prox forearm         0.15                         +-----------------+-------------+----------+--------+   Summary:   Measurements above.      Assessment/Plan:    47 year old right-hand-dominant female with chronic kidney disease stage V in need of dialysis access.  We discussed her options being catheter, fistula, graft with fistula being preferable.  At this time we have been asked to place a fistula or weight on  graft as she does not appear to have any surface veins either by duplex or physical exam.  Plan will be for graft when time is appropriate deemed by her nephrologist.  She does not need specific follow-up prior to surgical intervention.     Maeola Harman MD [facility]

## 2023-03-12 NOTE — Telephone Encounter (Signed)
Call placed to patient to inform her that she is not eligible for another rollator, message left with call back requested.   Printed order for AFO faxed to [facility] as requested by Sherlie Ban, PT

## 2023-03-13 ENCOUNTER — Other Ambulatory Visit: Payer: Self-pay

## 2023-03-13 ENCOUNTER — Encounter (HOSPITAL_COMMUNITY): Payer: Self-pay | Admitting: *Deleted

## 2023-03-13 ENCOUNTER — Inpatient Hospital Stay (HOSPITAL_COMMUNITY)
Admission: EM | Admit: 2023-03-13 | Discharge: 2023-03-19 | DRG: 673 | Disposition: A | Discharge status: Home / Self Care | Payer: 59 | Attending: Internal Medicine | Admitting: Internal Medicine

## 2023-03-13 DIAGNOSIS — F17210 Nicotine dependence, cigarettes, uncomplicated: Secondary | ICD-10-CM | POA: Diagnosis present

## 2023-03-13 DIAGNOSIS — R110 Nausea: Secondary | ICD-10-CM | POA: Diagnosis not present

## 2023-03-13 DIAGNOSIS — Z992 Dependence on renal dialysis: Secondary | ICD-10-CM | POA: Diagnosis not present

## 2023-03-13 DIAGNOSIS — R112 Nausea with vomiting, unspecified: Secondary | ICD-10-CM

## 2023-03-13 DIAGNOSIS — Z79899 Other long term (current) drug therapy: Secondary | ICD-10-CM

## 2023-03-13 DIAGNOSIS — I480 Paroxysmal atrial fibrillation: Secondary | ICD-10-CM | POA: Diagnosis present

## 2023-03-13 DIAGNOSIS — N19 Unspecified kidney failure: Principal | ICD-10-CM

## 2023-03-13 DIAGNOSIS — E8809 Other disorders of plasma-protein metabolism, not elsewhere classified: Secondary | ICD-10-CM | POA: Diagnosis present

## 2023-03-13 DIAGNOSIS — K529 Noninfective gastroenteritis and colitis, unspecified: Secondary | ICD-10-CM | POA: Diagnosis present

## 2023-03-13 DIAGNOSIS — F332 Major depressive disorder, recurrent severe without psychotic features: Secondary | ICD-10-CM | POA: Diagnosis not present

## 2023-03-13 DIAGNOSIS — Z6835 Body mass index (BMI) 35.0-35.9, adult: Secondary | ICD-10-CM

## 2023-03-13 DIAGNOSIS — D631 Anemia in chronic kidney disease: Secondary | ICD-10-CM | POA: Diagnosis not present

## 2023-03-13 DIAGNOSIS — R9431 Abnormal electrocardiogram [ECG] [EKG]: Secondary | ICD-10-CM | POA: Diagnosis present

## 2023-03-13 DIAGNOSIS — M5440 Lumbago with sciatica, unspecified side: Secondary | ICD-10-CM | POA: Diagnosis present

## 2023-03-13 DIAGNOSIS — D649 Anemia, unspecified: Secondary | ICD-10-CM | POA: Diagnosis present

## 2023-03-13 DIAGNOSIS — F419 Anxiety disorder, unspecified: Secondary | ICD-10-CM | POA: Diagnosis present

## 2023-03-13 DIAGNOSIS — I10 Essential (primary) hypertension: Secondary | ICD-10-CM | POA: Diagnosis present

## 2023-03-13 DIAGNOSIS — I4891 Unspecified atrial fibrillation: Secondary | ICD-10-CM | POA: Diagnosis present

## 2023-03-13 DIAGNOSIS — N2581 Secondary hyperparathyroidism of renal origin: Secondary | ICD-10-CM | POA: Diagnosis not present

## 2023-03-13 DIAGNOSIS — I120 Hypertensive chronic kidney disease with stage 5 chronic kidney disease or end stage renal disease: Secondary | ICD-10-CM | POA: Diagnosis not present

## 2023-03-13 DIAGNOSIS — M549 Dorsalgia, unspecified: Secondary | ICD-10-CM | POA: Diagnosis not present

## 2023-03-13 DIAGNOSIS — Z8249 Family history of ischemic heart disease and other diseases of the circulatory system: Secondary | ICD-10-CM

## 2023-03-13 DIAGNOSIS — E119 Type 2 diabetes mellitus without complications: Secondary | ICD-10-CM | POA: Diagnosis not present

## 2023-03-13 DIAGNOSIS — I639 Cerebral infarction, unspecified: Secondary | ICD-10-CM | POA: Diagnosis not present

## 2023-03-13 DIAGNOSIS — E785 Hyperlipidemia, unspecified: Secondary | ICD-10-CM | POA: Diagnosis present

## 2023-03-13 DIAGNOSIS — N186 End stage renal disease: Secondary | ICD-10-CM | POA: Diagnosis not present

## 2023-03-13 DIAGNOSIS — Z888 Allergy status to other drugs, medicaments and biological substances status: Secondary | ICD-10-CM

## 2023-03-13 DIAGNOSIS — R197 Diarrhea, unspecified: Secondary | ICD-10-CM | POA: Diagnosis not present

## 2023-03-13 DIAGNOSIS — R7989 Other specified abnormal findings of blood chemistry: Secondary | ICD-10-CM | POA: Diagnosis present

## 2023-03-13 DIAGNOSIS — Z833 Family history of diabetes mellitus: Secondary | ICD-10-CM

## 2023-03-13 DIAGNOSIS — Z8261 Family history of arthritis: Secondary | ICD-10-CM

## 2023-03-13 DIAGNOSIS — F32A Depression, unspecified: Secondary | ICD-10-CM | POA: Diagnosis present

## 2023-03-13 DIAGNOSIS — K219 Gastro-esophageal reflux disease without esophagitis: Secondary | ICD-10-CM | POA: Diagnosis present

## 2023-03-13 DIAGNOSIS — R392 Extrarenal uremia: Secondary | ICD-10-CM | POA: Diagnosis not present

## 2023-03-13 DIAGNOSIS — E43 Unspecified severe protein-calorie malnutrition: Secondary | ICD-10-CM | POA: Diagnosis present

## 2023-03-13 DIAGNOSIS — E1122 Type 2 diabetes mellitus with diabetic chronic kidney disease: Secondary | ICD-10-CM | POA: Diagnosis present

## 2023-03-13 DIAGNOSIS — Z7901 Long term (current) use of anticoagulants: Secondary | ICD-10-CM

## 2023-03-13 DIAGNOSIS — Z8673 Personal history of transient ischemic attack (TIA), and cerebral infarction without residual deficits: Secondary | ICD-10-CM

## 2023-03-13 DIAGNOSIS — R1111 Vomiting without nausea: Secondary | ICD-10-CM | POA: Diagnosis not present

## 2023-03-13 HISTORY — DX: End stage renal disease: N18.6

## 2023-03-13 LAB — CBC
HCT: 29.4 % — ABNORMAL LOW (ref 36.0–46.0)
Hemoglobin: 9.3 g/dL — ABNORMAL LOW (ref 12.0–15.0)
MCH: 24.4 pg — ABNORMAL LOW (ref 26.0–34.0)
MCHC: 31.6 g/dL (ref 30.0–36.0)
MCV: 77.2 fL — ABNORMAL LOW (ref 80.0–100.0)
Platelets: 273 10*3/uL (ref 150–400)
RBC: 3.81 MIL/uL — ABNORMAL LOW (ref 3.87–5.11)
RDW: 17.1 % — ABNORMAL HIGH (ref 11.5–15.5)
WBC: 9 10*3/uL (ref 4.0–10.5)
nRBC: 0 % (ref 0.0–0.2)

## 2023-03-13 LAB — COMPREHENSIVE METABOLIC PANEL
ALT: 6 U/L (ref 0–44)
AST: 13 U/L — ABNORMAL LOW (ref 15–41)
Albumin: 1.6 g/dL — ABNORMAL LOW (ref 3.5–5.0)
Alkaline Phosphatase: 54 U/L (ref 38–126)
Anion gap: 13 (ref 5–15)
BUN: 68 mg/dL — ABNORMAL HIGH (ref 6–20)
CO2: 16 mmol/L — ABNORMAL LOW (ref 22–32)
Calcium: 7.7 mg/dL — ABNORMAL LOW (ref 8.9–10.3)
Chloride: 113 mmol/L — ABNORMAL HIGH (ref 98–111)
Creatinine, Ser: 10.22 mg/dL — ABNORMAL HIGH (ref 0.44–1.00)
GFR, Estimated: 4 mL/min — ABNORMAL LOW (ref >60)
Glucose, Bld: 69 mg/dL — ABNORMAL LOW (ref 70–99)
Potassium: 4.3 mmol/L (ref 3.5–5.1)
Sodium: 142 mmol/L (ref 135–145)
Total Bilirubin: 0.8 mg/dL (ref 0.3–1.2)
Total Protein: 5.2 g/dL — ABNORMAL LOW (ref 6.5–8.1)

## 2023-03-13 LAB — MAGNESIUM: Magnesium: 2 mg/dL (ref 1.7–2.4)

## 2023-03-13 LAB — IRON AND TIBC
Iron: 36 ug/dL (ref 28–170)
Saturation Ratios: 22 % (ref 10.4–31.8)
TIBC: 164 ug/dL — ABNORMAL LOW (ref 250–450)
UIBC: 128 ug/dL

## 2023-03-13 LAB — LIPASE, BLOOD: Lipase: 20 U/L (ref 11–51)

## 2023-03-13 LAB — APTT: aPTT: 36 seconds (ref 24–36)

## 2023-03-13 LAB — PROTIME-INR
INR: 1 (ref 0.8–1.2)
Prothrombin Time: 13.4 seconds (ref 11.4–15.2)

## 2023-03-13 LAB — HEPATITIS B SURFACE ANTIGEN: Hepatitis B Surface Ag: NONREACTIVE

## 2023-03-13 LAB — FERRITIN: Ferritin: 84 ng/mL (ref 11–307)

## 2023-03-13 LAB — PHOSPHORUS: Phosphorus: 8.3 mg/dL — ABNORMAL HIGH (ref 2.5–4.6)

## 2023-03-13 MED ORDER — SODIUM CHLORIDE 0.9% FLUSH
3.0000 mL | Freq: Two times a day (BID) | INTRAVENOUS | Status: DC
  Administered 2023-03-13 – 2023-03-19 (×12): 3 mL via INTRAVENOUS

## 2023-03-13 MED ORDER — METOCLOPRAMIDE HCL 5 MG/ML IJ SOLN
10.0000 mg | Freq: Once | INTRAMUSCULAR | Status: AC
  Administered 2023-03-13: 10 mg via INTRAVENOUS
  Filled 2023-03-13: qty 2

## 2023-03-13 MED ORDER — CARVEDILOL 6.25 MG PO TABS
6.2500 mg | ORAL_TABLET | Freq: Two times a day (BID) | ORAL | Status: DC
  Administered 2023-03-13 – 2023-03-19 (×11): 6.25 mg via ORAL
  Filled 2023-03-13 (×11): qty 1

## 2023-03-13 MED ORDER — LIDOCAINE HCL (PF) 1 % IJ SOLN: 5.0000 mL | INTRAMUSCULAR | Status: DC | PRN

## 2023-03-13 MED ORDER — CHLORHEXIDINE GLUCONATE CLOTH 2 % EX PADS
6.0000 | MEDICATED_PAD | Freq: Every day | CUTANEOUS | Status: DC
  Administered 2023-03-14: 6 via TOPICAL

## 2023-03-13 MED ORDER — SODIUM CHLORIDE 0.9 % IV BOLUS
500.0000 mL | Freq: Once | INTRAVENOUS | Status: AC
  Administered 2023-03-13: 500 mL via INTRAVENOUS

## 2023-03-13 MED ORDER — ACETAMINOPHEN 325 MG PO TABS
650.0000 mg | ORAL_TABLET | Freq: Four times a day (QID) | ORAL | Status: DC | PRN
  Administered 2023-03-14 – 2023-03-17 (×3): 650 mg via ORAL
  Filled 2023-03-13 (×3): qty 2

## 2023-03-13 MED ORDER — HEPARIN SODIUM (PORCINE) 1000 UNIT/ML DIALYSIS: 1000.0000 [IU] | INTRAMUSCULAR | Status: DC | PRN

## 2023-03-13 MED ORDER — FENTANYL CITRATE PF 50 MCG/ML IJ SOSY
50.0000 ug | PREFILLED_SYRINGE | Freq: Once | INTRAMUSCULAR | Status: AC
  Administered 2023-03-13: 50 ug via INTRAVENOUS
  Filled 2023-03-13: qty 1

## 2023-03-13 MED ORDER — ALTEPLASE 2 MG IJ SOLR: 2.0000 mg | Freq: Once | INTRAMUSCULAR | Status: DC | PRN

## 2023-03-13 MED ORDER — PENTAFLUOROPROP-TETRAFLUOROETH EX AERO: 1.0000 | INHALATION_SPRAY | CUTANEOUS | Status: DC | PRN

## 2023-03-13 MED ORDER — LIDOCAINE-PRILOCAINE 2.5-2.5 % EX CREA: 1.0000 | TOPICAL_CREAM | CUTANEOUS | Status: DC | PRN

## 2023-03-13 MED ORDER — ACETAMINOPHEN 650 MG RE SUPP: 650.0000 mg | Freq: Four times a day (QID) | RECTAL | Status: DC | PRN

## 2023-03-13 NOTE — Consult Note (Signed)
ESRD Consult Note  Requesting provider: Dr. Debe Coder Service requesting consult: Internal Medicine Reason for consult: ESRD, provision of dialysis Indication for acute dialysis?: End Stage Renal Disease  Assessment/Recommendations: Stacey Lucas is a/an 47 y.o. 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   # ESRD: CKD V 2/2 FSGS now advanced to ESRD with uremic symptoms -NPO at [facility] for [facility] w/ IR tomorrow; appreciate the help -Start HD tomorrow after [facility] placed and likely again Saturday -Start CLIP process tomorrow -No urgent need for dialysis tonight -Manage nausea symptomatically for now  # Volume/ hypertension: volume status appears appropriate. On coreg. CTM for now. Not too aggressive while we start HD.  # Anemia of Chronic Kidney Disease: Hemoglobin 9.3 and MCV low. Obtain iron studies.  # Secondary Hyperparathyroidism/Hyperphosphatemia: Hyperphosphatemia 2/2 ESRD. Watch for now. Likely will need a binder at some point. Obtain PTH  # Vascular access: [facility] tomorrow. Consider consulting [facility] here for AVG  # Hypoalbuminemia:  fairly low, nephrotic syndrome playing some role. Consider supplementation.  #n/v/d: possible gastroeneteritis, w/u w/ primary team  # Additional recommendations: - Dose all meds for creatinine clearance < 10 ml/min  - Unless absolutely necessary, no MRIs with gadolinium.  - Implement save arm precautions.  Prefer needle sticks in the dorsum of the hands or wrists.  No blood pressure measurements in arm. - If blood transfusion is requested during hemodialysis sessions, please alert [place] prior to the session.  - Use synthetic opioids (Fentanyl/Dilaudid) if needed  Recommendations were discussed with the primary team.   History of Present Illness: Stacey Lucas is a/an 47 y.o. 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 Thedore Mins in the outpatient setting for advanced chronic kidney disease secondary to FSGS found on biopsy.  Disease has progressed fairly quickly.  She is in the process of starting dialysis in the outpatient setting.  She met with vascular surgery yesterday who feels she is best treated for a graft but has not completed this yet.  The patient states for the past week or 2 she has had progressive worsening nausea and vomiting with inability to keep down medications and poor appetite.  She also has had some diarrhea for the past few days.  She says that really she has had some of these symptoms for quite a while but at first the nausea and vomiting were fairly minimal but over the past couple weeks they have become more persistent.  She also has felt a little bit confused.  She denies significant shortness of breath or chest pain no fevers, chills, dysuria, hematuria.    He was seen in the emergency department on 7/1 for similar symptoms.  She was discharged at that time.  Creatinine was 9.3.  Albumin 1.7.  She returned because the symptoms were persistent and severe.  Today her creatinine is 10.   Medications:  Current Facility-Administered Medications  Medication Dose Route Frequency Provider Last Rate Last Admin   acetaminophen (TYLENOL) tablet 650 mg  650 mg Oral Q6H PRN Morene Crocker, MD       Or   acetaminophen (TYLENOL) suppository 650 mg  650 mg Rectal Q6H PRN Morene Crocker, MD       carvedilol (COREG) tablet 6.25 mg  6.25 mg Oral BID WC Morene Crocker, MD   6.25 mg at 03/13/23  2004   [START ON 03/14/2023] Chlorhexidine Gluconate Cloth 2 % PADS 6 each  6 each Topical Q0600 Darnell Level, MD       sodium chloride flush (NS) 0.9 % injection 3 mL  3 mL Intravenous Q12H Morene Crocker, MD         ALLERGIES Neurontin [gabapentin]  MEDICAL HISTORY Past Medical History:  Diagnosis Date   Acute infarct of  the left corona radiata/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 12/29/2021   Anxiety and depression    Cerebral thrombosis with cerebral infarction 05/24/2021   Cocaine abuse (HCC)    Depression with suicidal ideation    [facility] admission 04/2018   Diabetes mellitus without complication (HCC)    Type II   End stage kidney disease (HCC)    GERD (gastroesophageal reflux disease)    Hyperlipidemia    Hypertension    Impingement syndrome of right shoulder    Migraine headache    Osteoarthritis of right knee 05/24/2021   Pilonidal abscess 05/2013   Pyelonephritis    Right thyroid nodule    Sciatica      SOCIAL HISTORY Social History   Socioeconomic History   Marital status: Single    Spouse name: Not on file   Number of children: 3   Years of education: Not on file   Highest education level: Not on file  Occupational History   Not on file  Tobacco Use   Smoking status: Every Day    Packs/day: .25    Types: Cigarettes    Last attempt to quit: 01/16/2022    Years since quitting: 1.1    Passive exposure: Current   Smokeless tobacco: Never  Vaping Use   Vaping Use: Never used  Substance and Sexual Activity   Alcohol use: Not Currently    Comment: occasionally   Drug use: No   Sexual activity: Yes    Birth control/protection: None  Other Topics Concern   Not on file  Social History Narrative   Lives with children   "lots of caffeine"   Social Determinants of Health   Financial Resource Strain: Not on file  Food Insecurity: Food Insecurity Present (11/14/2022)   Hunger Vital Sign    Worried About Programme researcher, broadcasting/film/video in the Last Year: Often true    Ran Out of Food in the Last Year: Often true  Transportation Needs: Unmet Transportation Needs (11/14/2022)   PRAPARE - Administrator, Civil Service (Medical): Yes    Lack of Transportation (Non-Medical): Yes  Physical Activity: Not on file  Stress: Not on file  Social Connections: Not on  file  Intimate Partner Violence: Not At Risk (11/14/2022)   Humiliation, Afraid, Rape, and Kick questionnaire    Fear of Current or Ex-Partner: No    Emotionally Abused: No    Physically Abused: No    Sexually Abused: No     FAMILY HISTORY Family History  Problem Relation Age of Onset   Diabetes Mother    Hypertension Mother    Arthritis Mother    Diabetes Father    Hypertension Father      Review of Systems: 12 systems were reviewed and negative except per HPI  Physical Exam: Vitals:   03/13/23 1500 03/13/23 1930  BP: (!) 177/84 (!) 172/63  Pulse: (!) 104 96  Resp: 15 20  Temp:  98.1 F (36.7 C)  SpO2: 100% 100%   No intake/output data recorded. No intake or output data  in the 24 hours ending 03/13/23 2107 General: well-appearing, tearful but no significant distress HEENT: anicteric sclera, MMM CV: normal rate, no murmurs, no edema Lungs: bilateral chest rise, normal wob Abd: soft, non-tender, non-distended Skin: no visible lesions or rashes Psych: alert, engaged, appropriate mood and affect Neuro: normal speech, no gross focal deficits   Test Results Reviewed Lab Results  Component Value Date   NA 142 03/13/2023   K 4.3 03/13/2023   CL 113 (H) 03/13/2023   CO2 16 (L) 03/13/2023   BUN 68 (H) 03/13/2023   CREATININE 10.22 (H) 03/13/2023   CALCIUM 7.7 (L) 03/13/2023   ALBUMIN 1.6 (L) 03/13/2023   PHOS 8.3 (H) 03/13/2023    I have reviewed relevant outside healthcare records

## 2023-03-13 NOTE — H&P (Signed)
Date: 03/13/2023               Patient Name:  Stacey Lucas MRN: [id number]  DOB: 06-Dec-1975 Age / Sex: 47 y.o., female   PCP: Storm Frisk, MD         Medical Service: Internal Medicine Teaching Service         Attending Physician: Dr. Inez Catalina, MD    First Contact: Dr. Justin Mend  Pager: [phone]   Second Contact: Dr. Daiva Eves Pager: [phone]        After Hours (After 5p/  First Contact Pager: [phone]  weekends / holidays): Second Contact Pager: [phone]   Chief Complaint: Nausea/Vomiting, Diarrhea   History of Present Illness:  Stacey Lucas is a 67 female with PMH of ischemic infarct (2023, 2024), Afib, HLD, HTN, sciatica, GERD, previous cocaine use, depression, and ESRD (not on dialysis) presenting to ED with one week history of N/V, diarrhea, and abdominal discomfort.   Patient states that she was here on 7/1 for similar symptoms that started about a week ago. Abdominal pain was described as a sharp pain in her lower stomach that happens when she uses the bathroom. Diarrhea was described as ~5 loose stools daily. Has not observed any blood in the stool. Patient has been vomiting consistently and unable to keep food down. Endorsed feeling cold and chills but unsure if she had a fever. Took pepto bismol but did not feel improvement. Initially came to [facility] on 7/01 due to light headedness, n/v, and "not feeling like herself." Patient denies sick contacts, new medications, or changes in her eating habits. No recent back pain or changes in her urine patterns.  Denies recent use of antibiotics. Diarrhea is not associated with food consumption. Feels like there is this constant cramping pain.  Her nausea and vomiting have been present for longer than 2 weeks. Denies headache, altered mental status, or itching. Previously treated for T2DM (A1C 5.6) but is not taking insulin or diabetes medications now. CVA  in 2023 affecting L basal ganglia, then again March 2024 with  acute infarct of the L pons, uses walker for ambulation support. Was planning on getting dialysis started 7/03 but told her veins were too small/weak. Patient reports she has not had menstruation for 3-4 years. Denies vaginal bleeding.   Patient was recently seen in [facility] ED 7/01 for nausea and diarrhea. Also complained of some some pain in her lower back. Denied taking any medications to help with symptoms at that time. Mild generalized abdominal tenderness on exam. No CVA tenderness. Relevant labs at the time include Cr. 9.33, Calcium 8.3, Albumin 1.7, AST 12, and Hb 9.5. Suspected viral etiology and patient was given antiemetics with a plan to gain catheter access for HD on 7/03.   ED Course: Dr. Valentino Nose with nephrology was consulted and is following. Plan to consult IR to get a permacath to proceed with HD on 7/05.   Past medical problems: Ischemic infarct (2023, 2024), Afib HLD HTN Sciatica GERD, previous cocaine use Depression ESRD (not on dialysis)   Meds:  A complete medication review could not be completed as patient had a difficult time recalling all her active medications. Plan to revisit when her husband Stacey Lucas) is available to update the list.   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 12/29/2021   Anxiety and depression    Cerebral thrombosis with cerebral infarction 05/24/2021   Cocaine abuse (HCC)    Depression with suicidal ideation    [facility] admission 04/2018   Diabetes mellitus without complication (HCC)    Type II   End stage kidney disease (HCC)    GERD (gastroesophageal reflux disease)    Hyperlipidemia     Hypertension    Impingement syndrome of right shoulder    Migraine headache    Osteoarthritis of right knee 05/24/2021   Pilonidal abscess 05/2013   Pyelonephritis    Right thyroid nodule    Sciatica     Family History: on chart review Diabetes- Mother Hypertension- Mother Arthritis- Mother  Diabetes- Father Hypertension- Father   Social History:  Lives with her husband, who helps with some ADLs and IADLs since her CVA 11/2022. She endorses cigarette smoking since the age of 63, was smoking a pack a day up until March 2024 when she started smoking ~3-4 cigarettes a day. Denies smoking marijuana but noted previous use of cocaine (last time about a year ago). Reports no alcohol consumption. Does not work at this time.   Review of Systems: A complete ROS was negative except as per HPI.   Physical Exam: Blood pressure (!) 177/84, pulse (!) 104, resp. rate 15, height 5\' 5"  (1.651 m), weight 64.4 kg, last menstrual period 10/11/2019, SpO2 100 %. Constitutional: found patient laying in bed with lights off. She was cooperative throughout the exam and in no acute distress.  HEENT: normocephalic and atraumatic, mucous membranes dry, possibly swollen facial features, especially under her eyes; facial symmetry  CV: RRR, no rubs, murmurs, or gallops. Proximal and distal pulses intact. Trace edema b/l lower extremities. Pulmonary/Chest: clear lungs bilaterally, normal work of breathing on room air.  Abdominal: Soft, slightly tender on palpation along lower abdomen; positive bowel sounds.  Neurological: alert and oriented to self, place, and situation. Residual weakness on right side of body, otherwise ROM WNL.  Skin: Warm and dry.  Psych: Normal mood and affect.   EKG: Personally reviewed imaging with results interpreted as sinus tachycardia (104), left axis deviation, and borderline prolonged QT interval (384/506). EKG on 7/01 showed NSR without acute ST-T changes.   CXR: No new CXR for the  current admission to review.   Assessment & Plan by Problem: Principal Problem:   Colitis  Stacey Lucas is 47 y.o. with recent follow up in the medical system and PMH of stroke (2023, 2024), Afib, HLD, HTN, sciatica, GERD, previous cocaine use,depression, and ESRD (not on dialysis) who presented to [facility] ED with nausea, vomiting, and diarrhea admitted for worsening colitis seen on CT abdomen during prior ED visit and worsening renal function preparing HD during this admission.  Colitis Patient presents with 5-day course of diarrhea, nausea, and vomiting. Endorses chills and limited PO intake. Denies fever, sick contacts, or blood in the stool. Will continue work up to rule out infectious causes. QT prolonged 506. Pregnancy test 3 weeks ago with Hcg 5 (cutoff). Differential includes infectious etiology, which is the most likely cause of colitis at this time. CT abdomen/pelvis on 7/01 showing marked thickening of the ascending and transverse colonic wall with adjacent fat stranding concerning for moderate-to-severe colitis. Patient appeared slighly volume overloaded on exam. Lower on the differential  is pregnancy given lack of menstruation, pancreatic etiology given lipase 24 on 7/01, and liver etiology AST 14, ALT 8 on 6/23. Plan:  - C Diff screen - GI panel  - Volume repletion as tolerated - Received metoclopramide for nausea control but given prolonged QT time on EKG would try Phenergan or Tigan - F/u morning CBC - Repeat pregnancy test  - Tylenol 650 q6 PRN for pain  ESRD  Patient presenting with worsening renal function. History of nephrotic surgery now progressed to ESRD. Creatinine on admission 10.22 (9.33 on 7/1, baseline ~ 4.34 3 months ago). GFR range from 5-12, GFR on admission of 4. Nausea and vomiting started before her diarrhea. Potentially related to uremic symptoms. Dr. Valentino Nose with nephrology was consulted and ordered an IR consults for pending HD on 7/05.  Plan:  - NPO at  midnight - IR consult in the morning (7/05) - HD planned 7/05  - Continue to follow nephrology recommendations - F/u morning RFP  Atrial Fibrillation History of Afib on Eliquis 2.5 BID at home. Rate controlled on admission.  - Hold Eliquis 2.5 BID for catheter placement 7/05 (if catheter placement not planned for tomorrow resume) - F/u aPTT  - F/u PT INR  HTN BP 177/84, history of taking Coreg but per patient has not been taking it lately. Stopped taking amlodipine 10 mg.  - Start Carvedilol/Coreg 6.25 mg BID with meals  T2DM A1C 5.6 11/14/2022. Type 2 diabetes controlled, not taking any diabetes medications.  - Monitor CBGs - Start SSI if CBGs elevated  HLD Was on renally dosed rosuvastatin 10 mg as of 11/14/22 admission at [facility]. This was not a medication she endorsed taking but will clarify with her husband on 7/5 and will resume if it is one of her home medications.   Depression Patient previously restarted on Celexa and Wellbutrin during 11/14/22 [facility] admission. Patient reports not taking these medications currently.   Substance use Patient with history of cocaine use. Reported last using a year ago. On chart review, patient reported using cocaine weekly during her 11/14/22 [facility] admission.    Dispo: Admit patient to Inpatient with expected length of stay greater than 2 midnights.  Signed: Philomena Doheny, MD 03/13/2023, 7:18 PM  Pager: @[phone] @ After 5pm on weekdays and 1pm on weekends: On Call pager: [phone]

## 2023-03-13 NOTE — Hospital Course (Addendum)
Summary Stacey Lucas is a 78 female with PMH of ischemic infarct (2023, 2024), Afib, HLD, HTN, sciatica, GERD, previous cocaine use, depression, and ESRD (not on dialysis) presenting to ED with one week history of N/V, diarrhea, and abdominal discomfort. Patient was recently seen in [facility] ED 7/01 for nausea and diarrhea. Also complained of some some pain in her lower back. Denied taking any medications to help with symptoms at that time. Mild generalized abdominal tenderness on exam. No CVA tenderness. Relevant labs at the time included Cr. 9.33, Calcium 8.3, Albumin 1.7, AST 12, and Hb 9.5. Patient admitted for colitis, likely viral in nature, and hemodialysis management, including permanent catheter placement by IR.   ESRD secondary to FSGS Microcytic anemia RIJ TDC placed 03/14/23. Received 3 total HD session (7/05, 7/06, and 7/09), well tolerated. Renal function stable. Vascular surgery performed permanent dialysis access on 7/8 with an a/v graft fistula on the left arm. Apixaban/Eliquis held by Dr. Henry Russel (vascular surgery) for the procedure. Plan for outpatient hemodialysis in [facility]. Patient experienced some pain around the left arm fistula, given IV dilaudid for severe pain per nephrology recommendations.    Atrial fibrillation Rate controlled. Resumed Apixaban 2.5 mg BID after permanent dialysis access on 7/8.     Chronic hypertension Elevated throughout the stay, though asymptomatic. Continued therapy with carvedilol 6.25 mg BID and amlodipine 10 mg with as needed hydralazine if persistently elevated.   Loose stools Intermittent without clear pattern, likely viral initially then in the setting of new renal medications/binders. Improved over time with imodium as needed.    T2DM A1C 5.6 11/14/2022. Type 2 diabetes controlled, not taking any diabetes medications. Given Dextrose 50, 50mL 7/05 AM 2/2 CBG 61. Monitored CBGs throughout stay.   HLD Was on renally dosed rosuvastatin 10 mg as of  11/14/22 admission at [facility]. This was not a medication she endorsed taking. Started her on atorvastatin 80 mg.    Depression Patient previously restarted on Celexa and Wellbutrin during 11/14/22 [facility] admission. Patient reported not taking these medications currently.

## 2023-03-13 NOTE — Plan of Care (Signed)
  Problem: Elimination: Goal: Will not experience complications related to bowel motility Outcome: Progressing   Problem: Safety: Goal: Ability to remain free from injury will improve Outcome: Progressing   Problem: Skin Integrity: Goal: Risk for impaired skin integrity will decrease Outcome: Progressing   

## 2023-03-13 NOTE — ED Triage Notes (Addendum)
Pt with chronic diarrhea/vomiting (seen here on 7/1 for same).   States is supposed to start dialysis soon, but graft not placed yet.  Ao x 4.  C/o epigastric pain.  BP 148/70 HR 104 RR 16 SATS 99% CBG 81

## 2023-03-13 NOTE — ED Notes (Signed)
ED TO INPATIENT HANDOFF REPORT  ED Nurse Name and Phone #:  [phone]  S Name/Age/Gender Stacey Lucas 47 y.o. female Room/Bed: 009C/009C  Code Status   Code Status: Full Code  Home/SNF/Other Home Patient oriented to: self and place, time, situation  Is this baseline? Yes   Triage Complete: Triage complete  Chief Complaint Colitis [K52.9]  Triage Note Pt with chronic diarrhea/vomiting (seen here on 7/1 for same).   States is supposed to start dialysis soon, but graft not placed yet.  Ao x 4.  C/o epigastric pain.  BP 148/70 HR 104 RR 16 SATS 99% CBG 81   Allergies Allergies  Allergen Reactions   Neurontin [Gabapentin] Other (See Comments)    Weakness  Balance impairment     Level of Care/Admitting Diagnosis ED Disposition   ED Disposition: Admit Condition: None Comment: Hospital Area: [facility] [[id number]]  Level of Care: Telemetry Medical [104]  May place patient in observation at [facility] or [facility] if equivalent level of care is available:: No  Covid Evaluation: Asymptomatic - no recent exposure (last 10 days) testing not required  Diagnosis: Colitis [409811]  Admitting Physician: Inez Catalina [[id number]]  Attending Physician: Nena Polio      B Medical/Surgery History 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 12/29/2021   Anxiety and depression    Cerebral thrombosis with cerebral infarction 05/24/2021   Cocaine abuse (HCC)    Depression with suicidal ideation    [facility] admission 04/2018   Diabetes mellitus without complication (HCC)    Type II   End stage kidney disease (HCC)    GERD (gastroesophageal reflux disease)    Hyperlipidemia    Hypertension    Impingement syndrome of right shoulder    Migraine headache    Osteoarthritis of right knee 05/24/2021   Pilonidal abscess 05/2013   Pyelonephritis     Right thyroid nodule    Sciatica    Past Surgical History:  Procedure Laterality Date   BUBBLE STUDY  01/01/2022   Procedure: BUBBLE STUDY;  Surgeon: Maisie Fus, MD;  Location: [facility] ENDOSCOPY;  Service: Cardiovascular;;   TEE WITHOUT CARDIOVERSION N/A 01/01/2022   Procedure: TRANSESOPHAGEAL ECHOCARDIOGRAM (TEE);  Surgeon: Maisie Fus, MD;  Location: [facility] ENDOSCOPY;  Service: Cardiovascular;  Laterality: N/A;   tubal       A IV Location/Drains/Wounds Patient Lines/Drains/Airways Status     Active Line/Drains/Airways     Name Placement date Placement time Site Days   Peripheral IV 03/13/23 22 G Anterior;Left Forearm 03/13/23  1625  Forearm  less than 1   Wound / Incision (Open or Dehisced) 11/20/22 Puncture Back Lateral;Left 11/20/22  1109  Back  113            Intake/Output Last 24 hours No intake or output data in the 24 hours ending 03/13/23 1807  Labs/Imaging Results for orders placed or performed during the hospital encounter of 03/13/23 (from the past 48 hour(s))  Lipase, blood     Status: None   Collection Time: 03/13/23  2:55 PM  Result Value Ref Range   Lipase 20 11 - 51 U/L    Comment: Performed at [facility], [address], [place], [place] [address]  Comprehensive metabolic panel     Status: Abnormal   Collection Time: 03/13/23  2:55 PM  Result Value Ref Range  Sodium 142 135 - 145 mmol/L   Potassium 4.3 3.5 - 5.1 mmol/L   Chloride 113 (H) 98 - 111 mmol/L   CO2 16 (L) 22 - 32 mmol/L   Glucose, Bld 69 (L) 70 - 99 mg/dL    Comment: Glucose reference range applies only to samples taken after fasting for at least 8 hours.   BUN 68 (H) 6 - 20 mg/dL   Creatinine, Ser 40.98 (H) 0.44 - 1.00 mg/dL   Calcium 7.7 (L) 8.9 - 10.3 mg/dL   Total Protein 5.2 (L) 6.5 - 8.1 g/dL   Albumin 1.6 (L) 3.5 - 5.0 g/dL   AST 13 (L) 15 - 41 U/L   ALT 6 0 - 44 U/L   Alkaline Phosphatase 54 38 - 126 U/L   Total Bilirubin 0.8 0.3 - 1.2 mg/dL   GFR, Estimated 4 (L) >60  mL/min    Comment: (NOTE) Calculated using the CKD-EPI Creatinine Equation (2021)    Anion gap 13 5 - 15    Comment: Performed at [facility], [address] [place], [place] [address]  CBC     Status: Abnormal   Collection Time: 03/13/23  2:55 PM  Result Value Ref Range   WBC 9.0 4.0 - 10.5 K/uL   RBC 3.81 (L) 3.87 - 5.11 MIL/uL   Hemoglobin 9.3 (L) 12.0 - 15.0 g/dL   HCT 78.2 (L) 95.6 - 21.3 %   MCV 77.2 (L) 80.0 - 100.0 fL   MCH 24.4 (L) 26.0 - 34.0 pg   MCHC 31.6 30.0 - 36.0 g/dL   RDW 08.6 (H) 57.8 - 46.9 %   Platelets 273 150 - 400 K/uL    Comment: REPEATED TO VERIFY   nRBC 0.0 0.0 - 0.2 %    Comment: Performed at [facility], [address], [place], [place] [address]   VAS [place] UPPER EXTREMITY ARTERIAL DUPLEX  Result Date: 03/12/2023  UPPER EXTREMITY DUPLEX STUDY Patient Name:  Stacey Lucas  Date of Exam:   03/12/2023 Medical Rec #: [id number]     Accession #:    [id number] Date of Birth: [birth year]    Patient Gender: F Patient Age:   55 years Exam Location:  [facility] Procedure:      VAS [place] UPPER EXTREMITY ARTERIAL DUPLEX Referring Phys: Lemar Livings --------------------------------------------------------------------------------  Indications: Pre-operative ecam.  Performing Technologist: Elita Quick RVT  Examination Guidelines: A complete evaluation includes B-mode imaging, spectral Doppler, color Doppler, and power Doppler as needed of all accessible portions of each vessel. Bilateral testing is considered an integral part of a complete examination. Limited examinations for reoccurring indications may be performed as noted.  Right Pre-Dialysis Findings: +-----------------------+----------+--------------------+---------+--------+ Location               PSV (cm/s)Intralum. Diam. (cm)Waveform Comments +-----------------------+----------+--------------------+---------+--------+ Brachial Antecub. fossa83        0.35                triphasic          +-----------------------+----------+--------------------+---------+--------+ Radial Art at Wrist    64        0.15                triphasic         +-----------------------+----------+--------------------+---------+--------+ Ulnar Art at Wrist     83        0.11                triphasic         +-----------------------+----------+--------------------+---------+--------+  Left Pre-Dialysis Findings: +-----------------------+----------+--------------------+---------+--------+ Location               PSV (cm/s)Intralum. Diam. (cm)Waveform Comments +-----------------------+----------+--------------------+---------+--------+ Brachial Antecub. fossa90        0.36                triphasic         +-----------------------+----------+--------------------+---------+--------+ Radial Art at Wrist    54        0.17                triphasic         +-----------------------+----------+--------------------+---------+--------+ Ulnar Art at Wrist     68        0.19                triphasic         +-----------------------+----------+--------------------+---------+--------+  Summary:   Measurements above. *See table(s) above for measurements and observations. Electronically signed by Lemar Livings MD on 03/12/2023 at 4:31:00 PM.    Final    VAS [place] UPPER EXT VEIN MAPPING (PRE-OP AVF)  Result Date: 03/12/2023 UPPER EXTREMITY VEIN MAPPING Patient Name:  Stacey Lucas  Date of Exam:   03/12/2023 Medical Rec #: [id number]     Accession #:    [id number] Date of Birth: February 20, 1976    Patient Gender: F Patient Age:   74 years Exam Location:  [facility] Procedure:      VAS [place] UPPER EXT VEIN MAPPING (PRE-OP AVF) Referring Phys: Lemar Livings --------------------------------------------------------------------------------  Indications: Pre-access. Performing Technologist: Elita Quick RVT  Examination Guidelines: A complete evaluation includes B-mode imaging, spectral Doppler, color Doppler, and  power Doppler as needed of all accessible portions of each vessel. Bilateral testing is considered an integral part of a complete examination. Limited examinations for reoccurring indications may be performed as noted. +-----------------+-------------+----------+--------------+ Right Cephalic   Diameter (cm)Depth (cm)   Findings    +-----------------+-------------+----------+--------------+ Shoulder             0.13                              +-----------------+-------------+----------+--------------+ Prox upper arm       0.14                              +-----------------+-------------+----------+--------------+ Mid upper arm                           not visualized +-----------------+-------------+----------+--------------+ Dist upper arm       0.09                              +-----------------+-------------+----------+--------------+ Antecubital fossa 0.12 / 0.17                          +-----------------+-------------+----------+--------------+ Prox forearm         0.15                              +-----------------+-------------+----------+--------------+ Mid forearm          0.19                              +-----------------+-------------+----------+--------------+ Dist  forearm         0.15                              +-----------------+-------------+----------+--------------+ +-----------------+-------------+----------+--------+ Right Basilic    Diameter (cm)Depth (cm)Findings +-----------------+-------------+----------+--------+ Dist upper arm       0.26                        +-----------------+-------------+----------+--------+ Antecubital fossa 0.35 / 0.21                    +-----------------+-------------+----------+--------+ Prox forearm         0.17                        +-----------------+-------------+----------+--------+ +-----------------+-------------+----------+--------+ Left Cephalic    Diameter (cm)Depth  (cm)Findings +-----------------+-------------+----------+--------+ Shoulder             0.28                        +-----------------+-------------+----------+--------+ Prox upper arm    0.36 / 0.19                    +-----------------+-------------+----------+--------+ Mid upper arm        0.22                        +-----------------+-------------+----------+--------+ Dist upper arm       0.17                        +-----------------+-------------+----------+--------+ Antecubital fossa 0.14 / 0.23                    +-----------------+-------------+----------+--------+ Prox forearm         0.24                        +-----------------+-------------+----------+--------+ Mid forearm          0.19                        +-----------------+-------------+----------+--------+ Dist forearm         0.16                        +-----------------+-------------+----------+--------+ +-----------------+-------------+----------+--------+ Left Basilic     Diameter (cm)Depth (cm)Findings +-----------------+-------------+----------+--------+ Mid upper arm        0.41                joins   +-----------------+-------------+----------+--------+ Dist upper arm    0.30 / 0.25                    +-----------------+-------------+----------+--------+ Antecubital fossa 0.25 / 0.30                    +-----------------+-------------+----------+--------+ Prox forearm         0.15                        +-----------------+-------------+----------+--------+ Summary:   Measurements above. *See table(s) above for measurements and observations.  Diagnosing physician: Lemar Livings MD Electronically signed by Lemar Livings MD on 03/12/2023 at 4:30:54 PM.    Final     Pending Labs Unresulted Labs (From admission, onward)  Start     Ordered   03/14/23 0500  Renal function panel  Tomorrow morning,   R        03/13/23 1759   03/14/23 0500  CBC  Tomorrow morning,   R         03/13/23 1759   03/13/23 1801  Beta hCG quant (ref lab)  Once,   R        03/13/23 1800   03/13/23 1702  Phosphorus  Once,   STAT        03/13/23 1701   03/13/23 1702  Magnesium  Once,   STAT        03/13/23 1701   03/13/23 1701  Protime-INR (coagulopathy lab panel)  (Coagulopathy Lab Panel )  ONCE - STAT,   STAT        03/13/23 1700   03/13/23 1701  APTT (coagulopathy lab panel)  (Coagulopathy Lab Panel )  ONCE - STAT,   STAT        03/13/23 1700   03/13/23 1626  Gastrointestinal Panel by PCR , Stool  (Gastrointestinal Panel by PCR, Stool                                                                                                                                                     **Does Not include CLOSTRIDIUM DIFFICILE testing. **If CDIFF testing is needed, place order from the "C Difficile Testing" order set.**)  Once,   URGENT        03/13/23 1625   03/13/23 1626  C Difficile Quick Screen w PCR reflex  (C Difficile quick screen w PCR reflex panel )  Once, for 24 hours,   URGENT       References:    CDiff Information Tool   03/13/23 1625            Vitals/Pain Today's Vitals   03/13/23 1451 03/13/23 1500  BP:  (Abnormal) 177/84  Pulse:  (Abnormal) 104  Resp:  15  SpO2:  100%  Weight: 64.4 kg   Height: 5\' 5"  (1.651 m)   PainSc: 10-Worst pain ever     Isolation Precautions Enteric precautions (UV disinfection)  Medications Medications  sodium chloride flush (NS) 0.9 % injection 3 mL (has no administration in time range)  acetaminophen (TYLENOL) tablet 650 mg (has no administration in time range)    Or  acetaminophen (TYLENOL) suppository 650 mg (has no administration in time range)  fentaNYL (SUBLIMAZE) injection 50 mcg (50 mcg Intravenous Given 03/13/23 1611)  metoCLOPramide (REGLAN) injection 10 mg (10 mg Intravenous Given 03/13/23 1630)  sodium chloride 0.9 % bolus 500 mL (500 mLs Intravenous New Bag/Given 03/13/23 1629)    Mobility walks with device      Focused Assessments GI/GU    R Recommendations: See Admitting Provider Note  Report given to:  Additional Notes:

## 2023-03-13 NOTE — ED Provider Notes (Signed)
[place] EMERGENCY DEPARTMENT AT [facility] Provider Note   CSN: [id number] Arrival date & time: 03/13/23  1441     History  Chief Complaint  Patient presents with   Diarrhea    Stacey Lucas is a 47 y.o. female with history of T2DM, HLD, HTN, sciatica, GERD, anxiety, depression, cocaine abuse, migraines, ESRD not yet on dialysis, afib on eliquis, who presents to the ER complaining of nausea, vomiting and diarrhea x 1 week. Came to the ER on 7/1 for same symptoms, she states she has had no improvement. Vomits everything she eats/drinks. Having multiple episodes of diarrhea daily, often times can't make it to the bedside commode in time. Also complaining of lower back pain, states it feels different than her past sciatica although it is in the same location. Intermittently radiates down both legs, similar to her sciatica. Has some residual difficulty walking due to weakness on the right after multiple strokes. No incontinence. No saddle paresthesias.   HPI     Home Medications Prior to Admission medications   Medication Sig Start Date End Date Taking? Authorizing Provider  Accu-Chek Softclix Lancets lancets SMARTSIG:Topical 1-4 Times Daily 01/31/22   [provider]  amLODipine (NORVASC) 10 MG tablet Take 1 tablet (10 mg total) by mouth daily. 02/04/23   Storm Frisk, MD  apixaban (ELIQUIS) 2.5 MG TABS tablet Take 1 tablet (2.5 mg total) by mouth 2 (two) times daily. 12/03/22   Storm Frisk, MD  atorvastatin (LIPITOR) 80 MG tablet Take 1 tablet (80 mg total) by mouth daily. 12/03/22   Storm Frisk, MD  azelastine (OPTIVAR) 0.05 % ophthalmic solution Place 1 drop into both eyes 2 (two) times daily. 08/21/22   Waldon Merl, PA-C  blood glucose meter kit and supplies KIT Dispense based on patient and insurance preference. Use up to four times daily as directed. 01/31/22   Fanny Dance, MD  carvedilol (COREG) 6.25 MG tablet Take 0.5 tablets (3.125 mg  total) by mouth 2 (two) times daily with a meal. 02/04/23 03/06/23  Storm Frisk, MD  Darbepoetin Alfa (ARANESP) 60 MCG/0.3ML SOSY injection Inject 0.3 mLs (60 mcg total) into the skin every Saturday at 6 PM. 11/23/22   Willette Cluster, MD  ondansetron (ZOFRAN) 4 MG tablet Take 1 tablet (4 mg total) by mouth every 8 (eight) hours as needed for up to 3 days for nausea or vomiting. 03/10/23 03/13/23  Gowens, Dow Adolph L, PA-C      Allergies    Gabapentin    Review of Systems   Review of Systems  Gastrointestinal:  Positive for diarrhea, nausea and vomiting.  Musculoskeletal:  Positive for back pain.  All other systems reviewed and are negative.   Physical Exam Updated Vital Signs BP (!) 177/84   Pulse (!) 104   Resp 15   Ht 5\' 5"  (1.651 m)   Wt 64.4 kg   LMP 10/11/2019   SpO2 100%   BMI 23.63 kg/m  Physical Exam Vitals and nursing note reviewed.  Constitutional:      Appearance: Normal appearance.  HENT:     Head: Normocephalic and atraumatic.  Eyes:     Conjunctiva/sclera: Conjunctivae normal.  Cardiovascular:     Rate and Rhythm: Normal rate and regular rhythm.  Pulmonary:     Effort: Pulmonary effort is normal. No respiratory distress.     Breath sounds: Normal breath sounds.  Abdominal:     General: There is no distension.  Palpations: Abdomen is soft.     Tenderness: There is generalized abdominal tenderness.  Musculoskeletal:     Comments: Lower lumbar paraspinal tenderness to palpation, no midline spinal tenderness, step offs or crepitus 5/5 strength in all extremities, normal sensation  Skin:    General: Skin is warm and dry.  Neurological:     General: No focal deficit present.     Mental Status: She is alert.     ED Results / Procedures / Treatments   Labs (all labs ordered are listed, but only abnormal results are displayed) Labs Reviewed  COMPREHENSIVE METABOLIC PANEL - Abnormal; Notable for the following components:      Result Value   Chloride 113  (*)    CO2 16 (*)    Glucose, Bld 69 (*)    BUN 68 (*)    Creatinine, Ser 10.22 (*)    Calcium 7.7 (*)    Total Protein 5.2 (*)    Albumin 1.6 (*)    AST 13 (*)    GFR, Estimated 4 (*)    All other components within normal limits  CBC - Abnormal; Notable for the following components:   RBC 3.81 (*)    Hemoglobin 9.3 (*)    HCT 29.4 (*)    MCV 77.2 (*)    MCH 24.4 (*)    RDW 17.1 (*)    All other components within normal limits  GASTROINTESTINAL PANEL BY PCR, STOOL (REPLACES STOOL CULTURE)  C DIFFICILE QUICK SCREEN W PCR REFLEX    LIPASE, BLOOD    EKG EKG Interpretation Date/Time:  Thursday March 13 2023 14:58:48 EDT Ventricular Rate:  104 PR Interval:  164 QRS Duration:  109 QT Interval:  384 QTC Calculation: 506 R Axis:   -33  Text Interpretation: Sinus tachycardia Left axis deviation Low voltage, precordial leads Nonspecific T abnormalities, lateral leads Borderline prolonged QT interval Baseline wander in lead(s) V3 increased rate from prior 7/24 Confirmed by Meridee Score [phone]) on 03/13/2023 3:05:03 PM  Radiology No data to display for ER visit.   Procedures Procedures    Medications Ordered in ED Medications  fentaNYL (SUBLIMAZE) injection 50 mcg (50 mcg Intravenous Given 03/13/23 1611)  metoCLOPramide (REGLAN) injection 10 mg (10 mg Intravenous Given 03/13/23 1630)  sodium chloride 0.9 % bolus 500 mL (500 mLs Intravenous New Bag/Given 03/13/23 1629)    ED Course/ Medical Decision Making/ A&P                             Medical Decision Making Amount and/or Complexity of Data Reviewed Labs: ordered.  Risk Prescription drug management. Decision regarding hospitalization.   This patient is a 47 y.o. female  who presents to the ED for concern of nausea, vomiting, diarrhea. Seen 3 days ago for same.   Differential diagnoses prior to evaluation: The emergent differential diagnosis includes, but is not limited to,  Infectious causes such as: Viral,  Bacterial, Parasitic; Toxin. Noninfectious causes such as: GI Bleed, Appendicitis, Mesenteric Ischemia, Diverticulitis, endocrine causes (adrenal, thyroid), Toxicologic exposures, Drug-associated, IBD . This is not an exhaustive differential.   Past Medical History / Co-morbidities / Social History: T2DM, HLD, HTN, sciatica, GERD, anxiety, depression, cocaine abuse, migraines, ESRD not yet on dialysis, afib on eliquis  Additional history: Chart reviewed. Pertinent results include: Reviewed ER visit note from 7/1 in which patient presented complaining of same symptoms. Had elevated creatinine and CT scan with colitis, otherwise reassuring workup. Discharged  after medications and PO challenge.   Reviewed consultation with vascular surgery yesterday in which they opted to place graft with fistula for initiation of dialysis. Plan for this when deemed appropriate by her nephrologist.   Physical Exam: Physical exam performed. The pertinent findings include: Mildly tachycardic, hypertensive, otherwise normal vitals.  Abdomen soft with generalized tenderness, no guarding. Lower lumbar paraspinal muscular tenderness. No spinal tenderness, step offs, or crepitus.   Lab Tests/Imaging studies: I personally interpreted labs/imaging and the pertinent results include:  CBC stable. CMP with raising BUN and creatinine, worsening GFR. Fairly stable electrolytes. Normal lipase. Stool studies pending.   Patient has relatively benign abdominal exam and does not meet sepsis criteria, low concern for acute intraabdominal infection, will defer further abdominal imaging at this time.   Cardiac monitoring: EKG obtained and interpreted by myself and attending physician which shows: sinus tachycardia with borderline prolonged QTC 506   Medications: I ordered medication including IVF, reglan, fentanyl.  I have reviewed the patients home medicines and have made adjustments as needed.  Consultations obtained: I consulted  with nephrologist Dr Valentino Nose who was able to evaluate patient and believes she should be admitted with orders for IR permacath placement and to initiate dialysis due to worsening uremia.   I consulted with hospitalist resident Dr Daiva Eves with internal medicine who will admit, admitted under attending Dr Criselda Peaches.    Disposition: After consideration of the diagnostic results and the patients response to treatment, I feel that patient would benefit from medical admission for ongoing evaluation and treatment of worsening uremia requiring dialysis in the setting of volume depletion from GI loss.   I discussed this case with my attending physician Dr. Theresia Lo who cosigned this note including patient's presenting symptoms, physical exam, and planned diagnostics and interventions. Attending physician stated agreement with plan or made changes to plan which were implemented.   Final Clinical Impression(s) / ED Diagnoses Final diagnoses:  Uremia  Nausea vomiting and diarrhea  End-stage renal disease needing dialysis [facility])    Rx / DC Orders ED Discharge Orders     None      Portions of this report may have been transcribed using voice recognition software. Every effort was made to ensure accuracy; however, inadvertent computerized transcription errors may be present.    Jeanella Flattery 03/13/23 1808    Terrilee Files, MD 03/13/23 Nicholos Johns

## 2023-03-14 ENCOUNTER — Observation Stay (HOSPITAL_COMMUNITY): Payer: 59

## 2023-03-14 DIAGNOSIS — N2581 Secondary hyperparathyroidism of renal origin: Secondary | ICD-10-CM | POA: Diagnosis not present

## 2023-03-14 DIAGNOSIS — F17210 Nicotine dependence, cigarettes, uncomplicated: Secondary | ICD-10-CM | POA: Diagnosis not present

## 2023-03-14 DIAGNOSIS — Z992 Dependence on renal dialysis: Secondary | ICD-10-CM

## 2023-03-14 DIAGNOSIS — M5440 Lumbago with sciatica, unspecified side: Secondary | ICD-10-CM | POA: Diagnosis not present

## 2023-03-14 DIAGNOSIS — D631 Anemia in chronic kidney disease: Secondary | ICD-10-CM

## 2023-03-14 DIAGNOSIS — Z7901 Long term (current) use of anticoagulants: Secondary | ICD-10-CM | POA: Diagnosis not present

## 2023-03-14 DIAGNOSIS — R9431 Abnormal electrocardiogram [ECG] [EKG]: Secondary | ICD-10-CM | POA: Diagnosis not present

## 2023-03-14 DIAGNOSIS — I4891 Unspecified atrial fibrillation: Secondary | ICD-10-CM

## 2023-03-14 DIAGNOSIS — I120 Hypertensive chronic kidney disease with stage 5 chronic kidney disease or end stage renal disease: Secondary | ICD-10-CM | POA: Diagnosis not present

## 2023-03-14 DIAGNOSIS — E43 Unspecified severe protein-calorie malnutrition: Secondary | ICD-10-CM | POA: Diagnosis not present

## 2023-03-14 DIAGNOSIS — Z888 Allergy status to other drugs, medicaments and biological substances status: Secondary | ICD-10-CM | POA: Diagnosis not present

## 2023-03-14 DIAGNOSIS — N186 End stage renal disease: Secondary | ICD-10-CM

## 2023-03-14 DIAGNOSIS — E8809 Other disorders of plasma-protein metabolism, not elsewhere classified: Secondary | ICD-10-CM | POA: Diagnosis not present

## 2023-03-14 DIAGNOSIS — F1990 Other psychoactive substance use, unspecified, uncomplicated: Secondary | ICD-10-CM

## 2023-03-14 DIAGNOSIS — Z79899 Other long term (current) drug therapy: Secondary | ICD-10-CM | POA: Diagnosis not present

## 2023-03-14 DIAGNOSIS — E1122 Type 2 diabetes mellitus with diabetic chronic kidney disease: Secondary | ICD-10-CM | POA: Diagnosis not present

## 2023-03-14 DIAGNOSIS — F419 Anxiety disorder, unspecified: Secondary | ICD-10-CM | POA: Diagnosis not present

## 2023-03-14 DIAGNOSIS — Z8673 Personal history of transient ischemic attack (TIA), and cerebral infarction without residual deficits: Secondary | ICD-10-CM | POA: Diagnosis not present

## 2023-03-14 DIAGNOSIS — Z6835 Body mass index (BMI) 35.0-35.9, adult: Secondary | ICD-10-CM | POA: Diagnosis not present

## 2023-03-14 DIAGNOSIS — R7989 Other specified abnormal findings of blood chemistry: Secondary | ICD-10-CM | POA: Diagnosis not present

## 2023-03-14 DIAGNOSIS — E785 Hyperlipidemia, unspecified: Secondary | ICD-10-CM | POA: Diagnosis not present

## 2023-03-14 DIAGNOSIS — R112 Nausea with vomiting, unspecified: Secondary | ICD-10-CM | POA: Diagnosis not present

## 2023-03-14 DIAGNOSIS — K219 Gastro-esophageal reflux disease without esophagitis: Secondary | ICD-10-CM | POA: Diagnosis not present

## 2023-03-14 DIAGNOSIS — K529 Noninfective gastroenteritis and colitis, unspecified: Secondary | ICD-10-CM | POA: Diagnosis not present

## 2023-03-14 DIAGNOSIS — D539 Nutritional anemia, unspecified: Secondary | ICD-10-CM | POA: Diagnosis not present

## 2023-03-14 DIAGNOSIS — F32A Depression, unspecified: Secondary | ICD-10-CM | POA: Diagnosis not present

## 2023-03-14 HISTORY — PX: IR US GUIDE VASC ACCESS RIGHT: IMG2390

## 2023-03-14 HISTORY — PX: IR FLUORO GUIDE CV LINE RIGHT: IMG2283

## 2023-03-14 LAB — GLUCOSE, CAPILLARY
Glucose-Capillary: 149 mg/dL — ABNORMAL HIGH (ref 70–99)
Glucose-Capillary: 61 mg/dL — ABNORMAL LOW (ref 70–99)
Glucose-Capillary: 66 mg/dL — ABNORMAL LOW (ref 70–99)
Glucose-Capillary: 130 mg/dL — ABNORMAL HIGH (ref 70–99)

## 2023-03-14 LAB — RAPID URINE DRUG SCREEN, HOSP PERFORMED
Amphetamines: NOT DETECTED
Barbiturates: NOT DETECTED
Benzodiazepines: NOT DETECTED
Cocaine: POSITIVE — AB
Opiates: NOT DETECTED
Tetrahydrocannabinol: NOT DETECTED

## 2023-03-14 LAB — RENAL FUNCTION PANEL
Albumin: <1.5 g/dL — ABNORMAL LOW (ref 3.5–5.0)
Anion gap: 12 (ref 5–15)
BUN: 68 mg/dL — ABNORMAL HIGH (ref 6–20)
CO2: 14 mmol/L — ABNORMAL LOW (ref 22–32)
Calcium: 7.2 mg/dL — ABNORMAL LOW (ref 8.9–10.3)
Chloride: 115 mmol/L — ABNORMAL HIGH (ref 98–111)
Creatinine, Ser: 10.17 mg/dL — ABNORMAL HIGH (ref 0.44–1.00)
GFR, Estimated: 4 mL/min — ABNORMAL LOW (ref >60)
Glucose, Bld: 56 mg/dL — ABNORMAL LOW (ref 70–99)
Phosphorus: 8.7 mg/dL — ABNORMAL HIGH (ref 2.5–4.6)
Potassium: 3.9 mmol/L (ref 3.5–5.1)
Sodium: 141 mmol/L (ref 135–145)

## 2023-03-14 LAB — HEPATITIS B CORE ANTIBODY, IGM: Hep B C IgM: NONREACTIVE

## 2023-03-14 LAB — CBC
HCT: 22.5 % — ABNORMAL LOW (ref 36.0–46.0)
Hemoglobin: 7.2 g/dL — ABNORMAL LOW (ref 12.0–15.0)
MCH: 23.8 pg — ABNORMAL LOW (ref 26.0–34.0)
MCHC: 32 g/dL (ref 30.0–36.0)
MCV: 74.5 fL — ABNORMAL LOW (ref 80.0–100.0)
Platelets: 227 10*3/uL (ref 150–400)
RBC: 3.02 MIL/uL — ABNORMAL LOW (ref 3.87–5.11)
RDW: 17 % — ABNORMAL HIGH (ref 11.5–15.5)
WBC: 7.7 10*3/uL (ref 4.0–10.5)
nRBC: 0 % (ref 0.0–0.2)

## 2023-03-14 MED ORDER — DEXTROSE 50 % IV SOLN
1.0000 | Freq: Once | INTRAVENOUS | Status: AC
  Administered 2023-03-14: 50 mL via INTRAVENOUS
  Filled 2023-03-14: qty 50

## 2023-03-14 MED ORDER — SEVELAMER CARBONATE 800 MG PO TABS
1600.0000 mg | ORAL_TABLET | Freq: Three times a day (TID) | ORAL | Status: DC
  Administered 2023-03-14 – 2023-03-16 (×7): 1600 mg via ORAL
  Filled 2023-03-14 (×8): qty 2

## 2023-03-14 MED ORDER — FENTANYL CITRATE (PF) 100 MCG/2ML IJ SOLN
INTRAMUSCULAR | Status: AC | PRN
  Administered 2023-03-14: 25 ug via INTRAVENOUS

## 2023-03-14 MED ORDER — MIDAZOLAM HCL 2 MG/2ML IJ SOLN
INTRAMUSCULAR | Status: AC
  Filled 2023-03-14: qty 2

## 2023-03-14 MED ORDER — FENTANYL CITRATE (PF) 100 MCG/2ML IJ SOLN
INTRAMUSCULAR | Status: AC
  Filled 2023-03-14: qty 2

## 2023-03-14 MED ORDER — MIDAZOLAM HCL 2 MG/2ML IJ SOLN
INTRAMUSCULAR | Status: AC | PRN
  Administered 2023-03-14: 1 mg via INTRAVENOUS

## 2023-03-14 MED ORDER — CHLORHEXIDINE GLUCONATE CLOTH 2 % EX PADS
6.0000 | MEDICATED_PAD | Freq: Every day | CUTANEOUS | Status: DC
  Administered 2023-03-15 – 2023-03-19 (×5): 6 via TOPICAL

## 2023-03-14 MED ORDER — DARBEPOETIN ALFA 60 MCG/0.3ML IJ SOSY
60.0000 ug | PREFILLED_SYRINGE | INTRAMUSCULAR | Status: DC
  Administered 2023-03-14: 60 ug via SUBCUTANEOUS
  Filled 2023-03-14: qty 0.3

## 2023-03-14 MED ORDER — HEPARIN SODIUM (PORCINE) 1000 UNIT/ML IJ SOLN
INTRAMUSCULAR | Status: AC
  Administered 2023-03-14: 3200 [IU]
  Filled 2023-03-14: qty 4

## 2023-03-14 MED ORDER — PROSOURCE PLUS PO LIQD
30.0000 mL | Freq: Two times a day (BID) | ORAL | Status: DC
  Administered 2023-03-14 – 2023-03-19 (×8): 30 mL via ORAL
  Filled 2023-03-14 (×8): qty 30

## 2023-03-14 MED ORDER — CEFAZOLIN SODIUM-DEXTROSE 2-4 GM/100ML-% IV SOLN
INTRAVENOUS | Status: AC
  Filled 2023-03-14: qty 100

## 2023-03-14 MED ORDER — SODIUM CHLORIDE 0.9 % IV SOLN
200.0000 mg | Freq: Every day | INTRAVENOUS | Status: AC
  Administered 2023-03-14 – 2023-03-15 (×2): 200 mg via INTRAVENOUS
  Filled 2023-03-14: qty 10
  Filled 2023-03-14: qty 200

## 2023-03-14 MED ORDER — LIDOCAINE-EPINEPHRINE 1 %-1:100000 IJ SOLN
INTRAMUSCULAR | Status: AC
  Filled 2023-03-14: qty 1

## 2023-03-14 MED ORDER — HEPARIN SODIUM (PORCINE) 1000 UNIT/ML IJ SOLN
INTRAMUSCULAR | Status: AC
  Filled 2023-03-14: qty 10

## 2023-03-14 MED ORDER — CEFAZOLIN SODIUM-DEXTROSE 2-4 GM/100ML-% IV SOLN
INTRAVENOUS | Status: AC | PRN
  Administered 2023-03-14: 2 g via INTRAVENOUS

## 2023-03-14 MED ORDER — SEVELAMER CARBONATE 800 MG PO TABS: 800.0000 mg | ORAL_TABLET | Freq: Three times a day (TID) | ORAL | Status: DC

## 2023-03-14 NOTE — Progress Notes (Signed)
Patient ID: Stacey Lucas, female   DOB: June 17, 1976, 47 y.o.   MRN: [id number] S: pt seen and examined while on HD.  Tolerating it well. O:BP (!) 154/82   Pulse 78   Temp 98 F (36.7 C)   Resp 11   Ht 5\' 5"  (1.651 m)   Wt 66.1 kg   LMP 10/11/2019   SpO2 100%   BMI 24.25 kg/m  No intake or output data in the 24 hours ending 03/14/23 1219 Intake/Output: No intake/output data recorded.  Intake/Output this shift:  No intake/output data recorded. Weight change:  Gen: NAD CVS: RRR Resp:CTA Abd: +BS, soft, NT/ND Ext: no edema  Recent Labs  Lab 03/10/23 2041 03/13/23 1455 03/13/23 1701 03/14/23 0441  NA 142 142  --  141  K 4.3 4.3  --  3.9  CL 109 113*  --  115*  CO2 16* 16*  --  14*  GLUCOSE 138* 69*  --  56*  BUN 67* 68*  --  68*  CREATININE 9.33* 10.22*  --  10.17*  ALBUMIN 1.7* 1.6*  --  <1.5*  CALCIUM 8.3* 7.7*  --  7.2*  PHOS  --   --  8.3* 8.7*  AST 12* 13*  --   --   ALT 9 6  --   --    Liver Function Tests: Recent Labs  Lab 03/10/23 2041 03/13/23 1455 03/14/23 0441  AST 12* 13*  --   ALT 9 6  --   ALKPHOS 57 54  --   BILITOT 0.4 0.8  --   PROT 5.3* 5.2*  --   ALBUMIN 1.7* 1.6* <1.5*   Recent Labs  Lab 03/10/23 2041 03/13/23 1455  LIPASE 24 20   No results for input(s): "AMMONIA" in the last 168 hours. CBC: Recent Labs  Lab 03/10/23 2041 03/13/23 1455 03/14/23 0441  WBC 10.2 9.0 7.7  HGB 9.5* 9.3* 7.2*  HCT 29.0* 29.4* 22.5*  MCV 75.3* 77.2* 74.5*  PLT 257 273 227   Cardiac Enzymes: No results for input(s): "CKTOTAL", "CKMB", "CKMBINDEX", "TROPONINI" in the last 168 hours. CBG: Recent Labs  Lab 03/14/23 0743 03/14/23 0817  GLUCAP 61* 149*    Iron Studies:  Recent Labs    03/13/23 2135  IRON 36  TIBC 164*  FERRITIN 84   Studies/Results: VAS [place] UPPER EXTREMITY ARTERIAL DUPLEX  Result Date: 03/12/2023  UPPER EXTREMITY DUPLEX STUDY Patient Name:  LUSERO THIBODEAUX  Date of Exam:   03/12/2023 Medical Rec #: [id number]     Accession #:     [id number] Date of Birth: [birth year]    Patient Gender: F Patient Age:   79 years Exam Location:  [facility] Procedure:      VAS [place] UPPER EXTREMITY ARTERIAL DUPLEX Referring Phys: Lemar Livings --------------------------------------------------------------------------------  Indications: Pre-operative ecam.  Performing Technologist: Elita Quick RVT  Examination Guidelines: A complete evaluation includes B-mode imaging, spectral Doppler, color Doppler, and power Doppler as needed of all accessible portions of each vessel. Bilateral testing is considered an integral part of a complete examination. Limited examinations for reoccurring indications may be performed as noted.  Right Pre-Dialysis Findings: +-----------------------+----------+--------------------+---------+--------+ Location               PSV (cm/s)Intralum. Diam. (cm)Waveform Comments +-----------------------+----------+--------------------+---------+--------+ Brachial Antecub. fossa83        0.35                triphasic         +-----------------------+----------+--------------------+---------+--------+ Radial  Art at Wrist    64        0.15                triphasic         +-----------------------+----------+--------------------+---------+--------+ Ulnar Art at Wrist     83        0.11                triphasic         +-----------------------+----------+--------------------+---------+--------+  Left Pre-Dialysis Findings: +-----------------------+----------+--------------------+---------+--------+ Location               PSV (cm/s)Intralum. Diam. (cm)Waveform Comments +-----------------------+----------+--------------------+---------+--------+ Brachial Antecub. fossa90        0.36                triphasic         +-----------------------+----------+--------------------+---------+--------+ Radial Art at Wrist    54        0.17                triphasic          +-----------------------+----------+--------------------+---------+--------+ Ulnar Art at Wrist     68        0.19                triphasic         +-----------------------+----------+--------------------+---------+--------+  Summary:   Measurements above. *See table(s) above for measurements and observations. Electronically signed by Lemar Livings MD on 03/12/2023 at 4:31:00 PM.    Final    VAS [place] UPPER EXT VEIN MAPPING (PRE-OP AVF)  Result Date: 03/12/2023 UPPER EXTREMITY VEIN MAPPING Patient Name:  RILY MYERS  Date of Exam:   03/12/2023 Medical Rec #: [id number]     Accession #:    [id number] Date of Birth: Dec 27, 1975    Patient Gender: F Patient Age:   63 years Exam Location:  [facility] Procedure:      VAS [place] UPPER EXT VEIN MAPPING (PRE-OP AVF) Referring Phys: Lemar Livings --------------------------------------------------------------------------------  Indications: Pre-access. Performing Technologist: Elita Quick RVT  Examination Guidelines: A complete evaluation includes B-mode imaging, spectral Doppler, color Doppler, and power Doppler as needed of all accessible portions of each vessel. Bilateral testing is considered an integral part of a complete examination. Limited examinations for reoccurring indications may be performed as noted. +-----------------+-------------+----------+--------------+ Right Cephalic   Diameter (cm)Depth (cm)   Findings    +-----------------+-------------+----------+--------------+ Shoulder             0.13                              +-----------------+-------------+----------+--------------+ Prox upper arm       0.14                              +-----------------+-------------+----------+--------------+ Mid upper arm                           not visualized +-----------------+-------------+----------+--------------+ Dist upper arm       0.09                              +-----------------+-------------+----------+--------------+  Antecubital fossa 0.12 / 0.17                          +-----------------+-------------+----------+--------------+  Prox forearm         0.15                              +-----------------+-------------+----------+--------------+ Mid forearm          0.19                              +-----------------+-------------+----------+--------------+ Dist forearm         0.15                              +-----------------+-------------+----------+--------------+ +-----------------+-------------+----------+--------+ Right Basilic    Diameter (cm)Depth (cm)Findings +-----------------+-------------+----------+--------+ Dist upper arm       0.26                        +-----------------+-------------+----------+--------+ Antecubital fossa 0.35 / 0.21                    +-----------------+-------------+----------+--------+ Prox forearm         0.17                        +-----------------+-------------+----------+--------+ +-----------------+-------------+----------+--------+ Left Cephalic    Diameter (cm)Depth (cm)Findings +-----------------+-------------+----------+--------+ Shoulder             0.28                        +-----------------+-------------+----------+--------+ Prox upper arm    0.36 / 0.19                    +-----------------+-------------+----------+--------+ Mid upper arm        0.22                        +-----------------+-------------+----------+--------+ Dist upper arm       0.17                        +-----------------+-------------+----------+--------+ Antecubital fossa 0.14 / 0.23                    +-----------------+-------------+----------+--------+ Prox forearm         0.24                        +-----------------+-------------+----------+--------+ Mid forearm          0.19                        +-----------------+-------------+----------+--------+ Dist forearm         0.16                         +-----------------+-------------+----------+--------+ +-----------------+-------------+----------+--------+ Left Basilic     Diameter (cm)Depth (cm)Findings +-----------------+-------------+----------+--------+ Mid upper arm        0.41                joins   +-----------------+-------------+----------+--------+ Dist upper arm    0.30 / 0.25                    +-----------------+-------------+----------+--------+ Antecubital fossa 0.25 / 0.30                    +-----------------+-------------+----------+--------+  Prox forearm         0.15                        +-----------------+-------------+----------+--------+ Summary:   Measurements above. *See table(s) above for measurements and observations.  Diagnosing physician: Lemar Livings MD Electronically signed by Lemar Livings MD on 03/12/2023 at 4:30:54 PM.    Final     carvedilol  6.25 mg Oral BID WC   Chlorhexidine Gluconate Cloth  6 each Topical Q0600   heparin sodium (porcine)       sevelamer carbonate  1,600 mg Oral TID WC   sodium chloride flush  3 mL Intravenous Q12H    BMET    Component Value Date/Time   NA 141 03/14/2023 0441   NA 143 03/05/2022 1045   K 3.9 03/14/2023 0441   CL 115 (H) 03/14/2023 0441   CO2 14 (L) 03/14/2023 0441   GLUCOSE 56 (L) 03/14/2023 0441   BUN 68 (H) 03/14/2023 0441   BUN 21 03/05/2022 1045   CREATININE 10.17 (H) 03/14/2023 0441   CALCIUM 7.2 (L) 03/14/2023 0441   GFRNONAA 4 (L) 03/14/2023 0441   GFRAA >60 04/14/2020 1335   CBC    Component Value Date/Time   WBC 7.7 03/14/2023 0441   RBC 3.02 (L) 03/14/2023 0441   HGB 7.2 (L) 03/14/2023 0441   HGB 10.9 (L) 03/05/2022 1045   HCT 22.5 (L) 03/14/2023 0441   HCT 32.6 (L) 03/05/2022 1045   PLT 227 03/14/2023 0441   PLT 287 03/05/2022 1045   MCV 74.5 (L) 03/14/2023 0441   MCV 77 (L) 03/05/2022 1045   MCH 23.8 (L) 03/14/2023 0441   MCHC 32.0 03/14/2023 0441   RDW 17.0 (H) 03/14/2023 0441   RDW 14.0 03/05/2022 1045   LYMPHSABS  2.3 11/16/2022 0352   LYMPHSABS 3.0 03/05/2022 1045   MONOABS 0.9 11/16/2022 0352   EOSABS 0.3 11/16/2022 0352   EOSABS 0.3 03/05/2022 1045   BASOSABS 0.0 11/16/2022 0352   BASOSABS 0.0 03/05/2022 1045     Assessment/Plan:  ESRD - due to FSGS.  S/p RIJ TDC today and now receiving her first HD session.  Plan for HD again tomorrow.  Discussed case with Dr. Randie Heinz who saw her 2 days ago and will arrange for AVG placement next week.  Will need to start the CLIP process. HTN/volume - stable. Anemia of CKD Stage V - Hgb down to 7.2.  Will start ESA and IV iron.  TSAT 22%, ferritin 84.  Transfuse for Hgb <7. Secondary hyperparathyroidism/CKD-BMD - phos 8.7.  will start binders and follow. Vascular access - as outlined above Severe protein malnutrition - alb <1.5.  recommend protein supplementation.   Additional recommendations: - Dose all meds for creatinine clearance < 10 ml/min  - Unless absolutely necessary, no MRIs with gadolinium.  - Implement save arm precautions.  Prefer needle sticks in the dorsum of the hands or wrists.  No blood pressure measurements in arm. - If blood transfusion is requested during hemodialysis sessions, please alert [place] prior to the session.  - Use synthetic opioids (Fentanyl/Dilaudid) if needed   Irena Cords, MD [facility]

## 2023-03-14 NOTE — Progress Notes (Signed)
Interval History: Stacey Lucas is a 47 female with PMH of ischemic infarct (2023, 2024), Afib, HLD, HTN, sciatica, GERD, previous cocaine use, depression, and ESRD (not on dialysis) presenting to ED with one week history of N/V, diarrhea, and abdominal discomfort. Patient was recently seen in [facility] ED 7/01 for nausea and diarrhea. Also complained of some some pain in her lower back. Denied taking any medications to help with symptoms at that time. Mild generalized abdominal tenderness on exam. No CVA tenderness. Relevant labs at the time include Cr. 9.33, Calcium 8.3, Albumin 1.7, AST 12, and Hb 9.5. Suspected viral etiology and patient was given antiemetics with a plan to gain catheter access for HD on 7/03.   Subjective:  Patient resting comfortably in bed this morning s/p catheter placement by IR. Able to tolerate food yesterday evening without N/V. No BM this morning. Denied any pain. Requested something to eat, believes she will be able to tolerate it. When asked if face is swollen beyond baseline patient reported maybe slightly. Reported she is always cold and endorsed fatigue. Will try to connect with her husband Stacey Lucas regarding home medication list.   Objective:  Vital signs in last 24 hours: Vitals:   03/14/23 0935 03/14/23 0940 03/14/23 0945 03/14/23 0955  BP: (!) 172/88 (!) 188/101 (!) 188/90 (!) 175/85  Pulse: 75 78 76 74  Resp: 14 14 14 16   Temp:      TempSrc:      SpO2: 100% 100% 99% 99%  Weight:      Height:       Physical Exam: Constitutional: found patient laying in bed with lights off. She was cooperative throughout the exam and in no acute distress.  HEENT: normocephalic and atraumatic, mucous membranes dry. CV: RRR, no rubs, murmurs, or gallops. Proximal and distal pulses intact. Trace edema b/l lower extremities. Pulmonary/Chest: clear lungs bilaterally, normal work of breathing on room air.  Abdominal: Soft, continues to have tenderness along lower abdomen on  palpation; positive bowel sounds.  Neurological: alert and oriented to self, place, and situation. Residual weakness on right side of body, otherwise ROM WNL.  Skin: Warm and dry.  Psych: Normal mood and affect.   Labs:     Latest Ref Rng & Units 03/14/2023    4:41 AM 03/13/2023    2:55 PM 03/10/2023    8:41 PM  CBC  WBC 4.0 - 10.5 K/uL 7.7  9.0  10.2   Hemoglobin 12.0 - 15.0 g/dL 7.2  9.3  9.5   Hematocrit 36.0 - 46.0 % 22.5  29.4  29.0   Platelets 150 - 400 K/uL 227  273  257    Renal Function Panel: Component Ref Range & Units 04:41 (03/14/23) 1 d ago (03/13/23) 1 d ago (03/13/23) 4 d ago (03/10/23) 3 wk ago (02/20/23) 3 wk ago (02/20/23) 3 mo ago (11/21/22)  Sodium 135 - 145 mmol/L 141  142 142  140 142  Potassium 3.5 - 5.1 mmol/L 3.9  4.3 4.3  3.6 4.2  Chloride 98 - 111 mmol/L 115 High   113 High  109  114 High  113 High   CO2 22 - 32 mmol/L 14 Low   16 Low  16 Low   17 Low  23  Glucose, Bld 70 - 99 mg/dL 56 Low   69 Low  CM 956 High  CM  80 CM 99 CM  Comment: Glucose reference range applies only to samples taken after fasting for at least 8  hours.  BUN 6 - 20 mg/dL 68 High   68 High  67 High   60 High  44 High   Creatinine, Ser 0.44 - 1.00 mg/dL 16.10 High   96.04 High  9.33 High   7.85 High  4.99 High   Calcium 8.9 - 10.3 mg/dL 7.2 Low   7.7 Low  8.3 Low   8.3 Low  7.9 Low   Phosphorus 2.5 - 4.6 mg/dL 8.7 High  8.3 High  CM     6.2 High   Albumin 3.5 - 5.0 g/dL <5.4 Low   1.6 Low  1.7 Low  1.6 Low   <1.5 Low   GFR, Estimated >60 mL/min 4 Low   4 Low  CM 5 Low  CM  6 Low  CM 10 Low  CM  Comment: (NOTE) Calculated using the CKD-EPI Creatinine Equation (2021)  Anion gap 5 - 15 12  13  CM 17 High  CM  9 CM 6 CM   C diff pending collection GI panel pending collection  Beta hCG in process  Assessment/Plan: [name] is 47 y.o. with recent follow up in the medical system and PMH of stroke (2023, 2024), Afib, HLD, HTN, sciatica, GERD, previous cocaine use,depression, and  ESRD (not on dialysis) who presented to [facility] ED with nausea, vomiting, and diarrhea admitted for worsening colitis seen on CT abdomen during prior ED visit and worsening renal function preparing HD during this admission.   Principal Problem:   Colitis Patient presents with 5-day course of diarrhea, nausea, and vomiting. Endorses chills and limited PO intake. Denies fever, sick contacts, or blood in the stool. Will continue work up to rule out infectious causes. QT prolonged 506. Pregnancy test 3 weeks ago with Hcg 5 (cutoff). Differential includes infectious etiology, which is the most likely cause of colitis at this time. CT abdomen/pelvis on 7/01 showing marked thickening of the ascending and transverse colonic wall with adjacent fat stranding concerning for moderate-to-severe colitis. Patient appeared slighly volume overloaded on exam. Lower on the differential is pregnancy given lack of menstruation, pancreatic etiology given lipase 24 on 7/01, and liver etiology AST 14, ALT 8 on 6/23. Denied N/V and diarrhea on exam this morning. Asking for diet. Per nephrology, renal diet appropriate with HD scheduled today 7/05.  Plan:  - C Diff screen needs to be collected - GI panel needs to be collected  - Volume repletion as tolerated - Can add Phenergan or Tigan for nausea if it continues - Pregnancy test pending - Tylenol 650 q6 PRN for pain  ESRD  CKD 2/2 FSGS Anemia of chronic kidney disease Hyperphosphatemia Patient presenting with worsening renal function. History of nephrotic surgery now progressed to ESRD. Creatinine 10.17 today, 10.22 on admission (9.33 on 7/1, baseline ~ 4.34 3 months ago). GFR range from 5-12, GFR on admission of 4. Nausea and vomiting started before her diarrhea. Potentially related to uremic symptoms. Dr. Valentino Nose with nephrology was consulted and is following. IR placed catheter 7/05. Chronic anemia- patient reports feeling chronically fatigued. Iron studies: Iron 36, TIBC 164,  Ferritin 84. Will follow nephrology recommendations for iron infusion. Phosphorus 8.7, 8.3 on admission.  Plan:  - HD planned 7/05  - F/u RFP after HD  - Continue to follow nephrology recommendations - Sevelaner Carbonate/Renvela added as phosphate binder   Atrial Fibrillation History of Afib on Eliquis 2.5 BID at home. Rate controlled on admission.  - Hold Eliquis 2.5 BID until 7/06 - F/u aPTT  -  F/u PT INR   HTN BP 177/84, history of taking Coreg but per patient has not been taking it lately. Stopped taking amlodipine 10 mg. Per nephrology, take medication management of HTN slowly pending HD 7/05.  - Start Carvedilol/Coreg 6.25 mg BID with meals   T2DM A1C 5.6 11/14/2022. Type 2 diabetes controlled, not taking any diabetes medications. Given Dextrose 50, 50mL 7/05 AM 2/2 CBG 61. - Monitor CBGs - Dextrose as needed - Start SSI if CBGs elevated   HLD Was on renally dosed rosuvastatin 10 mg as of 11/14/22 admission at [facility]. This was not a medication she endorsed taking but will clarify with her husband on 7/5 and will resume if it is one of her home medications.    Depression Patient previously restarted on Celexa and Wellbutrin during 11/14/22 [facility] admission. Patient reports not taking these medications currently.    Substance use Patient with history of cocaine use. Reported last using a year ago. On chart review, patient reported using cocaine weekly during her 11/14/22 [facility] admission.    Prior to Admission Living Arrangement: Home with husband.  Anticipated Discharge Location: Home with husband.  Barriers to Discharge: Pending HD 7/5, colitis  Dispo: Anticipated discharge in approximately 2 day(s).  DVT Prophylaxis: SCDs  Philomena Doheny, MD, PGY-1 03/14/2023, 11:05 AM Pager: @MYPAGER @ After 5pm on weekdays and 1pm on weekends: On Call pager [phone]

## 2023-03-14 NOTE — Procedures (Signed)
Vascular and Interventional Radiology Procedure Note  Patient: Stacey Lucas DOB: 1976/03/29 Medical Record Number: [id number] Note Date/Time: 03/14/23 10:00 AM   Performing Physician: Roanna Banning, MD Assistant(s): None  Diagnosis: ESRD requiring Hemodialysis  Procedure: TUNNELED HEMODIALYSIS CATHETER PLACEMENT  Anesthesia: Conscious Sedation Complications: None Estimated Blood Loss: Minimal Specimens:  None  Findings:  Successful placement of right-sided, 19 cm (tip-to-cuff), tunneled hemodialysis catheter with the tip of the catheter in the proximal right atrium.  Plan: Catheter ready for use.  See detailed procedure note with images in PACS. The patient tolerated the procedure well without incident or complication and was returned to Floor Bed in stable condition.    Roanna Banning, MD [facility]   Pager. [phone] Clinic. [phone]

## 2023-03-14 NOTE — Procedures (Signed)
I was present at this dialysis session. I have reviewed the session itself and made appropriate changes.   Vital signs in last 24 hours:  Temp:  [98 F (36.7 C)-98.5 F (36.9 C)] 98 F (36.7 C) (07/05 1120) Pulse Rate:  [74-104] 79 (07/05 1235) Resp:  [11-20] 11 (07/05 1235) BP: (143-188)/(63-101) 168/91 (07/05 1235) SpO2:  [99 %-100 %] 100 % (07/05 1235) Weight:  [64.4 kg-66.1 kg] 66.1 kg (07/05 1120) Weight change:  Filed Weights   03/13/23 1451 03/14/23 1120  Weight: 64.4 kg 66.1 kg    Recent Labs  Lab 03/14/23 0441  NA 141  K 3.9  CL 115*  CO2 14*  GLUCOSE 56*  BUN 68*  CREATININE 10.17*  CALCIUM 7.2*  PHOS 8.7*    Recent Labs  Lab 03/10/23 2041 03/13/23 1455 03/14/23 0441  WBC 10.2 9.0 7.7  HGB 9.5* 9.3* 7.2*  HCT 29.0* 29.4* 22.5*  MCV 75.3* 77.2* 74.5*  PLT 257 273 227    Scheduled Meds:  carvedilol  6.25 mg Oral BID WC   Chlorhexidine Gluconate Cloth  6 each Topical Q0600   darbepoetin (ARANESP) injection - DIALYSIS  60 mcg Subcutaneous Q Fri-1800   heparin sodium (porcine)       sevelamer carbonate  1,600 mg Oral TID WC   sodium chloride flush  3 mL Intravenous Q12H   Continuous Infusions:  iron sucrose     PRN Meds:.acetaminophen **OR** acetaminophen, alteplase, heparin, heparin sodium (porcine), lidocaine (PF), lidocaine-prilocaine, pentafluoroprop-tetrafluoroeth   Irena Cords,  MD 03/14/2023, 12:39 PM

## 2023-03-14 NOTE — Consult Note (Signed)
Hospital Consult    Reason for Consult: Permanent dialysis access Referring Physician: Dr. Arrie Aran MRN #:  [id number]  History of Present Illness: This is a 47 y.o. female recently evaluated in the office for permanent dialysis access.  She is now admitted receiving dialysis via catheter and we are consulted for permanent access.  She is right-hand dominant having had a stroke that affects her right side but still uses her right side more than left and we had previously planned for left arm access.  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 12/29/2021   Anxiety and depression    Cerebral thrombosis with cerebral infarction 05/24/2021   Cocaine abuse (HCC)    Depression with suicidal ideation    [facility] admission 04/2018   Diabetes mellitus without complication (HCC)    Type II   End stage kidney disease (HCC)    GERD (gastroesophageal reflux disease)    Hyperlipidemia    Hypertension    Impingement syndrome of right shoulder    Migraine headache    Osteoarthritis of right knee 05/24/2021   Pilonidal abscess 05/2013   Pyelonephritis    Right thyroid nodule    Sciatica     Past Surgical History:  Procedure Laterality Date   BUBBLE STUDY  01/01/2022   Procedure: BUBBLE STUDY;  Surgeon: Maisie Fus, MD;  Location: [facility] ENDOSCOPY;  Service: Cardiovascular;;   TEE WITHOUT CARDIOVERSION N/A 01/01/2022   Procedure: TRANSESOPHAGEAL ECHOCARDIOGRAM (TEE);  Surgeon: Maisie Fus, MD;  Location: [facility] ENDOSCOPY;  Service: Cardiovascular;  Laterality: N/A;   tubal      Allergies  Allergen Reactions   Neurontin [Gabapentin] Other (See Comments)    Weakness  Balance impairment     Prior to Admission medications   Medication Sig Start Date End Date Taking? Authorizing Provider  amLODipine (NORVASC) 10 MG tablet Take 1 tablet (10 mg total) by mouth daily. 02/04/23  Yes Storm Frisk, MD  apixaban (ELIQUIS) 2.5 MG TABS tablet Take 1 tablet (2.5 mg total) by mouth 2 (two) times daily. 12/03/22  Yes Storm Frisk, MD  atorvastatin (LIPITOR) 80 MG tablet Take 1 tablet (80 mg total) by mouth daily. 12/03/22  Yes Storm Frisk, MD  carvedilol (COREG) 6.25 MG tablet Take 0.5 tablets (3.125 mg total) by mouth 2 (two) times daily with a meal. Patient taking differently: Take 6.25 mg by mouth 2 (two) times daily with a meal. 02/04/23 03/13/23 Yes Storm Frisk, MD  azelastine (OPTIVAR) 0.05 % ophthalmic solution Place 1 drop into both eyes 2 (two) times daily. Patient not taking: Reported on 03/13/2023 08/21/22   Waldon Merl, PA-C  blood glucose meter kit and supplies KIT Dispense based on patient and insurance preference. Use up to four times daily as directed. 01/31/22   Fanny Dance, MD    Social History   Socioeconomic History   Marital status: Single    Spouse name: Not on file   Number of children: 3   Years of education: Not on file   Highest education level: Not on file  Occupational History   Not on file  Tobacco Use   Smoking status: Every Day    Packs/day: .25    Types: Cigarettes    Last attempt to quit: 01/16/2022    Years since quitting: 1.1    Passive exposure: Current   Smokeless tobacco: Never  Vaping Use  Vaping Use: Never used  Substance and Sexual Activity   Alcohol use: Not Currently    Comment: occasionally   Drug use: No   Sexual activity: Yes    Birth control/protection: None  Other Topics Concern   Not on file  Social History Narrative   Lives with children   "lots of caffeine"   Social Determinants of Health   Financial Resource Strain: Not on file  Food Insecurity: Food Insecurity Present (11/14/2022)   Hunger Vital Sign    Worried About Running Out of Food in the Last Year: Often true    Ran Out of Food in the Last Year: Often true  Transportation Needs: Unmet Transportation Needs (11/14/2022)   PRAPARE -  Administrator, Civil Service (Medical): Yes    Lack of Transportation (Non-Medical): Yes  Physical Activity: Not on file  Stress: Not on file  Social Connections: Not on file  Intimate Partner Violence: Not At Risk (11/14/2022)   Humiliation, Afraid, Rape, and Kick questionnaire    Fear of Current or Ex-Partner: No    Emotionally Abused: No    Physically Abused: No    Sexually Abused: No     Family History  Problem Relation Age of Onset   Diabetes Mother    Hypertension Mother    Arthritis Mother    Diabetes Father    Hypertension Father     Review of Systems  Constitutional: Negative.   HENT: Negative.    Eyes: Negative.   Respiratory: Negative.    Cardiovascular: Negative.   Gastrointestinal: Negative.   Musculoskeletal: Negative.   Skin: Negative.   Neurological:  Positive for focal weakness.  Endo/Heme/Allergies: Negative.   Psychiatric/Behavioral: Negative.        Physical Examination  Vitals:   03/14/23 1235 03/14/23 1305  BP: (!) 168/91 (!) 168/93  Pulse: 79 84  Resp: 11 14  Temp:    SpO2: 100% 100%     Physical Exam HENT:     Head: Normocephalic.     Nose: Nose normal.     Mouth/Throat:     Mouth: Mucous membranes are moist.  Cardiovascular:     Rate and Rhythm: Normal rate.     Pulses:          Radial pulses are 2+ on the right side and 2+ on the left side.  Pulmonary:     Effort: Pulmonary effort is normal.  Abdominal:     General: Abdomen is flat.  Musculoskeletal:        General: Normal range of motion.     Right lower leg: No edema.     Left lower leg: No edema.  Skin:    General: Skin is warm.     Capillary Refill: Capillary refill takes less than 2 seconds.  Neurological:     Mental Status: She is alert. Mental status is at baseline.     Motor: Weakness present.  Psychiatric:        Mood and Affect: Mood normal.        Thought Content: Thought content normal.        Judgment: Judgment normal.      CBC     Component Value Date/Time   WBC 7.7 03/14/2023 0441   RBC 3.02 (L) 03/14/2023 0441   HGB 7.2 (L) 03/14/2023 0441   HGB 10.9 (L) 03/05/2022 1045   HCT 22.5 (L) 03/14/2023 0441   HCT 32.6 (L) 03/05/2022 1045   PLT 227 03/14/2023 0441  PLT 287 03/05/2022 1045   MCV 74.5 (L) 03/14/2023 0441   MCV 77 (L) 03/05/2022 1045   MCH 23.8 (L) 03/14/2023 0441   MCHC 32.0 03/14/2023 0441   RDW 17.0 (H) 03/14/2023 0441   RDW 14.0 03/05/2022 1045   LYMPHSABS 2.3 11/16/2022 0352   LYMPHSABS 3.0 03/05/2022 1045   MONOABS 0.9 11/16/2022 0352   EOSABS 0.3 11/16/2022 0352   EOSABS 0.3 03/05/2022 1045   BASOSABS 0.0 11/16/2022 0352   BASOSABS 0.0 03/05/2022 1045    BMET    Component Value Date/Time   NA 141 03/14/2023 0441   NA 143 03/05/2022 1045   K 3.9 03/14/2023 0441   CL 115 (H) 03/14/2023 0441   CO2 14 (L) 03/14/2023 0441   GLUCOSE 56 (L) 03/14/2023 0441   BUN 68 (H) 03/14/2023 0441   BUN 21 03/05/2022 1045   CREATININE 10.17 (H) 03/14/2023 0441   CALCIUM 7.2 (L) 03/14/2023 0441   GFRNONAA 4 (L) 03/14/2023 0441   GFRAA >60 04/14/2020 1335    COAGS: Lab Results  Component Value Date   INR 1.0 03/13/2023   INR 0.9 11/20/2022   INR 1.0 11/14/2022     Non-Invasive Vascular Imaging:   Previous vein mapping reviewed   ASSESSMENT/PLAN: This is a 47 y.o. female now with end-stage renal disease now dialyzing via catheter.  Plan will be for left arm AV fistula versus more likely graft next week in the OR.  I will update patient and teams when operative timing is established.  Tayja Manzer C. Randie Heinz, MD [facility] Office: [phone] Pager: [phone]

## 2023-03-14 NOTE — Progress Notes (Signed)
Requested to see pt for out-pt HD needs at d/c. Spoke to pt and pt's husband via phone. Introduced self and explained role. Explained to pt and pt's husband that pt's insurance is not accepted by [facility] therefore no clinic in [place] will be an option. Discussed options through [facility] ([facility] [place]) or [facility] ([place] or [place]). Pt's husband felt it most appropriate to proceed with referral to [facility] for [facility] due to the fact it is the closest to pt's home and is in [facility] due to pt's need for transportation to/from HD. Contacted Carla with [facility] out-pt HD. Case discussed and clinicals faxed for review. Update provided to nephrologist and requested Hep B total core antibody lab which is required for referral to [facility]. Contacted [facility]OC staff to advise them of pt's need for out-pt HD at d/c and to also advise them that pt/pt's husband would like to confirm pt can be transported to/from HD through pt's medicaid transportation. Will assist as needed.

## 2023-03-14 NOTE — Progress Notes (Addendum)
Received patient in bed.Awake ,alert and oriented x 4.Consent verified,   Access used : Newly placed Right HD catheter that worked well.Dressing ondate.  Duration of treatment: 2 hours.  Fluid removed ; Even as ordered.  Hemo comment: Tolerated treatment very well.  Hand off to the patient's nurse.

## 2023-03-14 NOTE — H&P (Signed)
Chief Complaint: CKD. Request is for [facility] placement  Referring Physician(s): Dr. Debe Coder  Supervising Physician: Roanna Banning  Patient Status: [facility] - In-pt  History of Present Illness: Stacey Lucas is a 47 y.o. female patient. History of DM, HTN, HLT, GERD, ESRD (not previously of HD). Presented  to the ED at [facility] on 7.4.24 with nausea, vomiting and diarrhea X 1 week. Found to be uremic. Team is requesting a tunneled HD catheter for dialysis initiation.   Currently without any significant complaints. Patient alert and laying in bed,calm. Denies any fevers, headache, chest pain, SOB, cough, abdominal pain, nausea, vomiting or bleeding. Return precautions and treatment recommendations and follow-up discussed with the patient  who is agreeable with the plan.    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 12/29/2021   Anxiety and depression    Cerebral thrombosis with cerebral infarction 05/24/2021   Cocaine abuse (HCC)    Depression with suicidal ideation    [facility] admission 04/2018   Diabetes mellitus without complication (HCC)    Type II   End stage kidney disease (HCC)    GERD (gastroesophageal reflux disease)    Hyperlipidemia    Hypertension    Impingement syndrome of right shoulder    Migraine headache    Osteoarthritis of right knee 05/24/2021   Pilonidal abscess 05/2013   Pyelonephritis    Right thyroid nodule    Sciatica     Past Surgical History:  Procedure Laterality Date   BUBBLE STUDY  01/01/2022   Procedure: BUBBLE STUDY;  Surgeon: Maisie Fus, MD;  Location: [facility] ENDOSCOPY;  Service: Cardiovascular;;   TEE WITHOUT CARDIOVERSION N/A 01/01/2022   Procedure: TRANSESOPHAGEAL ECHOCARDIOGRAM (TEE);  Surgeon: Maisie Fus, MD;  Location: [facility] ENDOSCOPY;  Service: Cardiovascular;  Laterality: N/A;   tubal      Allergies: Neurontin  [gabapentin]  Medications: Prior to Admission medications   Medication Sig Start Date End Date Taking? Authorizing Provider  amLODipine (NORVASC) 10 MG tablet Take 1 tablet (10 mg total) by mouth daily. 02/04/23  Yes Storm Frisk, MD  apixaban (ELIQUIS) 2.5 MG TABS tablet Take 1 tablet (2.5 mg total) by mouth 2 (two) times daily. 12/03/22  Yes Storm Frisk, MD  atorvastatin (LIPITOR) 80 MG tablet Take 1 tablet (80 mg total) by mouth daily. 12/03/22  Yes Storm Frisk, MD  carvedilol (COREG) 6.25 MG tablet Take 0.5 tablets (3.125 mg total) by mouth 2 (two) times daily with a meal. Patient taking differently: Take 6.25 mg by mouth 2 (two) times daily with a meal. 02/04/23 03/13/23 Yes Storm Frisk, MD  azelastine (OPTIVAR) 0.05 % ophthalmic solution Place 1 drop into both eyes 2 (two) times daily. Patient not taking: Reported on 03/13/2023 08/21/22   Waldon Merl, PA-C  blood glucose meter kit and supplies KIT Dispense based on patient and insurance preference. Use up to four times daily as directed. 01/31/22   Fanny Dance, MD     Family History  Problem Relation Age of Onset   Diabetes Mother    Hypertension Mother    Arthritis Mother    Diabetes Father    Hypertension Father     Social History   Socioeconomic History   Marital status: Single    Spouse name: Not on file   Number of children: 3   Years of education: Not on file   Highest education  level: Not on file  Occupational History   Not on file  Tobacco Use   Smoking status: Every Day    Packs/day: .25    Types: Cigarettes    Last attempt to quit: 01/16/2022    Years since quitting: 1.1    Passive exposure: Current   Smokeless tobacco: Never  Vaping Use   Vaping Use: Never used  Substance and Sexual Activity   Alcohol use: Not Currently    Comment: occasionally   Drug use: No   Sexual activity: Yes    Birth control/protection: None  Other Topics Concern   Not on file  Social History  Narrative   Lives with children   "lots of caffeine"   Social Determinants of Health   Financial Resource Strain: Not on file  Food Insecurity: Food Insecurity Present (11/14/2022)   Hunger Vital Sign    Worried About Running Out of Food in the Last Year: Often true    Ran Out of Food in the Last Year: Often true  Transportation Needs: Unmet Transportation Needs (11/14/2022)   PRAPARE - Administrator, Civil Service (Medical): Yes    Lack of Transportation (Non-Medical): Yes  Physical Activity: Not on file  Stress: Not on file  Social Connections: Not on file    Review of Systems: A 12 point ROS discussed and pertinent positives are indicated in the HPI above.  All other systems are negative.  Review of Systems  Constitutional:  Negative for fatigue and fever.  HENT:  Negative for congestion.   Respiratory:  Negative for cough and shortness of breath.   Gastrointestinal:  Negative for abdominal pain, diarrhea, nausea and vomiting.    Vital Signs: BP (!) 182/79 (BP Location: Right Arm)   Pulse 85   Temp 98.5 F (36.9 C)   Resp 18   Ht 5\' 5"  (1.651 m)   Wt 142 lb (64.4 kg)   LMP 10/11/2019   SpO2 100%   BMI 23.63 kg/m     Physical Exam Vitals and nursing note reviewed.  Constitutional:      Appearance: She is well-developed.  HENT:     Head: Normocephalic and atraumatic.  Eyes:     Conjunctiva/sclera: Conjunctivae normal.  Cardiovascular:     Rate and Rhythm: Normal rate and regular rhythm.     Heart sounds: Normal heart sounds.  Pulmonary:     Effort: Pulmonary effort is normal.     Breath sounds: Normal breath sounds.  Musculoskeletal:        General: Normal range of motion.     Cervical back: Normal range of motion.  Skin:    General: Skin is warm and dry.  Neurological:     General: No focal deficit present.     Mental Status: She is alert and oriented to person, place, and time.  Psychiatric:        Mood and Affect: Mood normal.         Behavior: Behavior normal.        Thought Content: Thought content normal.        Judgment: Judgment normal.     Imaging: VAS [place] UPPER EXTREMITY ARTERIAL DUPLEX  Result Date: 03/12/2023  UPPER EXTREMITY DUPLEX STUDY Patient Name:  JAMAR RUMMELL  Date of Exam:   03/12/2023 Medical Rec #: [id number]     Accession #:    [id number] Date of Birth: 15-May-1976    Patient Gender: F Patient Age:   39 years Exam Location:  [facility] Procedure:      VAS [place] UPPER EXTREMITY ARTERIAL DUPLEX Referring Phys: Lemar Livings --------------------------------------------------------------------------------  Indications: Pre-operative ecam.  Performing Technologist: Elita Quick RVT  Examination Guidelines: A complete evaluation includes B-mode imaging, spectral Doppler, color Doppler, and power Doppler as needed of all accessible portions of each vessel. Bilateral testing is considered an integral part of a complete examination. Limited examinations for reoccurring indications may be performed as noted.  Right Pre-Dialysis Findings: +-----------------------+----------+--------------------+---------+--------+ Location               PSV (cm/s)Intralum. Diam. (cm)Waveform Comments +-----------------------+----------+--------------------+---------+--------+ Brachial Antecub. fossa83        0.35                triphasic         +-----------------------+----------+--------------------+---------+--------+ Radial Art at Wrist    64        0.15                triphasic         +-----------------------+----------+--------------------+---------+--------+ Ulnar Art at Wrist     83        0.11                triphasic         +-----------------------+----------+--------------------+---------+--------+  Left Pre-Dialysis Findings: +-----------------------+----------+--------------------+---------+--------+ Location               PSV (cm/s)Intralum. Diam. (cm)Waveform Comments  +-----------------------+----------+--------------------+---------+--------+ Brachial Antecub. fossa90        0.36                triphasic         +-----------------------+----------+--------------------+---------+--------+ Radial Art at Wrist    54        0.17                triphasic         +-----------------------+----------+--------------------+---------+--------+ Ulnar Art at Wrist     68        0.19                triphasic         +-----------------------+----------+--------------------+---------+--------+  Summary:   Measurements above. *See table(s) above for measurements and observations. Electronically signed by Lemar Livings MD on 03/12/2023 at 4:31:00 PM.    Final    VAS [place] UPPER EXT VEIN MAPPING (PRE-OP AVF)  Result Date: 03/12/2023 UPPER EXTREMITY VEIN MAPPING Patient Name:  Stacey Lucas  Date of Exam:   03/12/2023 Medical Rec #: [id number]     Accession #:    [id number] Date of Birth: 07-Feb-1976    Patient Gender: F Patient Age:   81 years Exam Location:  [facility] Procedure:      VAS [place] UPPER EXT VEIN MAPPING (PRE-OP AVF) Referring Phys: Lemar Livings --------------------------------------------------------------------------------  Indications: Pre-access. Performing Technologist: Elita Quick RVT  Examination Guidelines: A complete evaluation includes B-mode imaging, spectral Doppler, color Doppler, and power Doppler as needed of all accessible portions of each vessel. Bilateral testing is considered an integral part of a complete examination. Limited examinations for reoccurring indications may be performed as noted. +-----------------+-------------+----------+--------------+ Right Cephalic   Diameter (cm)Depth (cm)   Findings    +-----------------+-------------+----------+--------------+ Shoulder             0.13                              +-----------------+-------------+----------+--------------+ Prox upper arm  0.14                               +-----------------+-------------+----------+--------------+ Mid upper arm                           not visualized +-----------------+-------------+----------+--------------+ Dist upper arm       0.09                              +-----------------+-------------+----------+--------------+ Antecubital fossa 0.12 / 0.17                          +-----------------+-------------+----------+--------------+ Prox forearm         0.15                              +-----------------+-------------+----------+--------------+ Mid forearm          0.19                              +-----------------+-------------+----------+--------------+ Dist forearm         0.15                              +-----------------+-------------+----------+--------------+ +-----------------+-------------+----------+--------+ Right Basilic    Diameter (cm)Depth (cm)Findings +-----------------+-------------+----------+--------+ Dist upper arm       0.26                        +-----------------+-------------+----------+--------+ Antecubital fossa 0.35 / 0.21                    +-----------------+-------------+----------+--------+ Prox forearm         0.17                        +-----------------+-------------+----------+--------+ +-----------------+-------------+----------+--------+ Left Cephalic    Diameter (cm)Depth (cm)Findings +-----------------+-------------+----------+--------+ Shoulder             0.28                        +-----------------+-------------+----------+--------+ Prox upper arm    0.36 / 0.19                    +-----------------+-------------+----------+--------+ Mid upper arm        0.22                        +-----------------+-------------+----------+--------+ Dist upper arm       0.17                        +-----------------+-------------+----------+--------+ Antecubital fossa 0.14 / 0.23                     +-----------------+-------------+----------+--------+ Prox forearm         0.24                        +-----------------+-------------+----------+--------+ Mid forearm          0.19                        +-----------------+-------------+----------+--------+  Dist forearm         0.16                        +-----------------+-------------+----------+--------+ +-----------------+-------------+----------+--------+ Left Basilic     Diameter (cm)Depth (cm)Findings +-----------------+-------------+----------+--------+ Mid upper arm        0.41                joins   +-----------------+-------------+----------+--------+ Dist upper arm    0.30 / 0.25                    +-----------------+-------------+----------+--------+ Antecubital fossa 0.25 / 0.30                    +-----------------+-------------+----------+--------+ Prox forearm         0.15                        +-----------------+-------------+----------+--------+ Summary:   Measurements above. *See table(s) above for measurements and observations.  Diagnosing physician: Lemar Livings MD Electronically signed by Lemar Livings MD on 03/12/2023 at 4:30:54 PM.    Final    CT ABDOMEN PELVIS WO CONTRAST  Result Date: 03/10/2023 CLINICAL DATA:  Acute abdominal pain, nonlocalized. EXAM: CT ABDOMEN AND PELVIS WITHOUT CONTRAST TECHNIQUE: Multidetector CT imaging of the abdomen and pelvis was performed following the standard protocol without IV contrast. RADIATION DOSE REDUCTION: This exam was performed according to the departmental dose-optimization program which includes automated exposure control, adjustment of the mA and/or kV according to patient size and/or use of iterative reconstruction technique. COMPARISON:  CT examination dated October 27, 2019 FINDINGS: Lower chest: No acute abnormality. Hepatobiliary: No focal liver abnormality is seen. No gallstones, gallbladder wall thickening, or biliary dilatation. Pancreas:  Unremarkable. No pancreatic ductal dilatation or surrounding inflammatory changes. Spleen: Normal in size without focal abnormality. Adrenals/Urinary Tract: Adrenal glands are unremarkable. Kidneys are normal, without renal calculi, focal lesion, or hydronephrosis. Bladder is unremarkable. Stomach/Bowel: Stomach is within normal limits. Appendix appears normal. There is marked thickening of the ascending and transverse colonic wall with adjacent fat stranding concerning for moderate-to-severe colitis. No extraluminal free air or abdominal collection. Vascular/Lymphatic: No significant vascular findings are present. No enlarged abdominal or pelvic lymph nodes. Reproductive: Uterus and bilateral adnexa are unremarkable. Other: No abdominal wall hernia or abnormality. No abdominopelvic ascites. Musculoskeletal: Mild degenerate disc disease of the lumbar spine. No acute osseous abnormality. IMPRESSION: 1. Marked thickening of the ascending and transverse colonic wall with adjacent fat stranding concerning for moderate-to-severe colitis. No evidence of bowel obstruction. 2. Normal appendix. 3. No evidence of nephrolithiasis or hydronephrosis. 4. Uterus and adnexa are unremarkable. 5. Mild degenerate disc disease of the lumbar spine. Electronically Signed   By: Larose Hires D.O.   On: 03/10/2023 18:16   DG Chest 2 View  Result Date: 03/10/2023 CLINICAL DATA:  Chest pain EXAM: CHEST - 2 VIEW COMPARISON:  Radiographs dated February 20, 2023 FINDINGS: The heart size and mediastinal contours are within normal limits. Both lungs are clear. The visualized skeletal structures are unremarkable. IMPRESSION: No active cardiopulmonary disease. Electronically Signed   By: Larose Hires D.O.   On: 03/10/2023 18:10   DG Chest 1 View  Result Date: 02/20/2023 CLINICAL DATA:  [id number] Chest pain [id number] EXAM: CHEST  1 VIEW COMPARISON:  CXR 11/16/22 FINDINGS: The heart size and mediastinal contours are within normal limits. Both lungs are  clear. The visualized skeletal structures are unremarkable. IMPRESSION:  No focal airspace opacity Electronically Signed   By: Lorenza Cambridge M.D.   On: 02/20/2023 18:39    Labs:  CBC: Recent Labs    02/20/23 1706 03/10/23 2041 03/13/23 1455 03/14/23 0441  WBC 9.8 10.2 9.0 7.7  HGB 9.0* 9.5* 9.3* 7.2*  HCT 28.2* 29.0* 29.4* 22.5*  PLT 273 257 273 227    COAGS: Recent Labs    11/14/22 1014 11/20/22 0244 03/13/23 1701  INR 1.0 0.9 1.0  APTT 40*  --  36    BMP: Recent Labs    02/20/23 1706 03/10/23 2041 03/13/23 1455 03/14/23 0441  NA 140 142 142 141  K 3.6 4.3 4.3 3.9  CL 114* 109 113* 115*  CO2 17* 16* 16* 14*  GLUCOSE 80 138* 69* 56*  BUN 60* 67* 68* 68*  CALCIUM 8.3* 8.3* 7.7* 7.2*  CREATININE 7.85* 9.33* 10.22* 10.17*  GFRNONAA 6* 5* 4* 4*    LIVER FUNCTION TESTS: Recent Labs    11/14/22 1014 11/16/22 0352 02/20/23 1706 03/10/23 2041 03/13/23 1455 03/14/23 0441  BILITOT 0.4  --  0.6 0.4 0.8  --   AST 24  --  14* 12* 13*  --   ALT 12  --  8 9 6   --   ALKPHOS 69  --  49 57 54  --   PROT 5.0*  --  4.9* 5.3* 5.2*  --   ALBUMIN <1.5*   < > 1.6* 1.7* 1.6* <1.5*   < > = values in this interval not displayed.    TUMOR MARKERS: No results for input(s): "AFPTM", "CEA", "CA199", "CHROMGRNA" in the last 8760 hours.  Assessment and Plan:  47 y.o. female inpatient. History of DM, HTN, HLT, GERD, ESRD (not previously of HD). Presented  to the ED at [facility] on 7.4.24 with nausea, vomiting and diarrhea X 1 week. Found to be uremic. Team is requesting a tunneled HD catheter for dialysis initiation.    BUN 68, Cr 10.17, Albumin < 1.5, Hgb 7.2, All labs and medications are within acceptable parameters. No pertinent allergies. Patient has been NPO since midnight.   Risks and benefits discussed with the patient including, but not limited to bleeding, infection, vascular injury, pneumothorax which may require chest tube placement, air embolism or even death  All of the  patient's questions were answered, patient is agreeable to proceed. Consent signed and in chart.   Thank you for this interesting consult.  I greatly enjoyed meeting Maddalyn Naseem and look forward to participating in their care.  A copy of this report was sent to the requesting provider on this date.  Electronically Signed: Alene Mires, NP 03/14/2023, 8:38 AM   I spent a total of 40 Minutes    in face to face in clinical consultation, greater than 50% of which was counseling/coordinating care for [facility]

## 2023-03-15 DIAGNOSIS — K529 Noninfective gastroenteritis and colitis, unspecified: Secondary | ICD-10-CM

## 2023-03-15 DIAGNOSIS — N186 End stage renal disease: Secondary | ICD-10-CM | POA: Diagnosis not present

## 2023-03-15 DIAGNOSIS — I120 Hypertensive chronic kidney disease with stage 5 chronic kidney disease or end stage renal disease: Secondary | ICD-10-CM | POA: Diagnosis not present

## 2023-03-15 DIAGNOSIS — E1122 Type 2 diabetes mellitus with diabetic chronic kidney disease: Secondary | ICD-10-CM | POA: Diagnosis not present

## 2023-03-15 DIAGNOSIS — I4891 Unspecified atrial fibrillation: Secondary | ICD-10-CM | POA: Diagnosis not present

## 2023-03-15 LAB — RENAL FUNCTION PANEL
Albumin: <1.5 g/dL — ABNORMAL LOW (ref 3.5–5.0)
Anion gap: 7 (ref 5–15)
BUN: 49 mg/dL — ABNORMAL HIGH (ref 6–20)
CO2: 24 mmol/L (ref 22–32)
Calcium: 7 mg/dL — ABNORMAL LOW (ref 8.9–10.3)
Chloride: 106 mmol/L (ref 98–111)
Creatinine, Ser: 7.31 mg/dL — ABNORMAL HIGH (ref 0.44–1.00)
GFR, Estimated: 6 mL/min — ABNORMAL LOW (ref >60)
Glucose, Bld: 116 mg/dL — ABNORMAL HIGH (ref 70–99)
Phosphorus: 5 mg/dL — ABNORMAL HIGH (ref 2.5–4.6)
Potassium: 3.4 mmol/L — ABNORMAL LOW (ref 3.5–5.1)
Sodium: 137 mmol/L (ref 135–145)

## 2023-03-15 LAB — GLUCOSE, CAPILLARY
Glucose-Capillary: 111 mg/dL — ABNORMAL HIGH (ref 70–99)
Glucose-Capillary: 107 mg/dL — ABNORMAL HIGH (ref 70–99)
Glucose-Capillary: 164 mg/dL — ABNORMAL HIGH (ref 70–99)
Glucose-Capillary: 119 mg/dL — ABNORMAL HIGH (ref 70–99)

## 2023-03-15 LAB — CBC
HCT: 22 % — ABNORMAL LOW (ref 36.0–46.0)
Hemoglobin: 7.3 g/dL — ABNORMAL LOW (ref 12.0–15.0)
MCH: 24.7 pg — ABNORMAL LOW (ref 26.0–34.0)
MCHC: 33.2 g/dL (ref 30.0–36.0)
MCV: 74.3 fL — ABNORMAL LOW (ref 80.0–100.0)
Platelets: 209 10*3/uL (ref 150–400)
RBC: 2.96 MIL/uL — ABNORMAL LOW (ref 3.87–5.11)
RDW: 16.9 % — ABNORMAL HIGH (ref 11.5–15.5)
WBC: 8.3 10*3/uL (ref 4.0–10.5)
nRBC: 0 % (ref 0.0–0.2)

## 2023-03-15 LAB — PARATHYROID HORMONE, INTACT (NO CA): PTH: 56 pg/mL (ref 15–65)

## 2023-03-15 LAB — HEPATITIS B SURFACE ANTIBODY, QUANTITATIVE: Hep B S AB Quant (Post): 43.6 m[IU]/mL

## 2023-03-15 LAB — BETA HCG QUANT (REF LAB): hCG Quant: 3 m[IU]/mL

## 2023-03-15 MED ORDER — AMLODIPINE BESYLATE 10 MG PO TABS
10.0000 mg | ORAL_TABLET | Freq: Every day | ORAL | Status: DC
  Administered 2023-03-15 – 2023-03-19 (×5): 10 mg via ORAL
  Filled 2023-03-15 (×5): qty 1

## 2023-03-15 MED ORDER — ATORVASTATIN CALCIUM 80 MG PO TABS
80.0000 mg | ORAL_TABLET | Freq: Every day | ORAL | Status: DC
  Administered 2023-03-15 – 2023-03-19 (×5): 80 mg via ORAL
  Filled 2023-03-15 (×5): qty 1

## 2023-03-15 MED ORDER — APIXABAN 2.5 MG PO TABS
2.5000 mg | ORAL_TABLET | Freq: Two times a day (BID) | ORAL | Status: DC
  Administered 2023-03-15 – 2023-03-16 (×2): 2.5 mg via ORAL
  Filled 2023-03-15 (×2): qty 1

## 2023-03-15 MED ORDER — POTASSIUM CHLORIDE 10 MEQ/100ML IV SOLN
10.0000 meq | INTRAVENOUS | Status: AC
  Administered 2023-03-15 (×3): 10 meq via INTRAVENOUS
  Filled 2023-03-15 (×3): qty 100

## 2023-03-15 NOTE — Plan of Care (Signed)

## 2023-03-15 NOTE — Progress Notes (Signed)
Patient ID: Darnita Buick, female   DOB: June 18, 1976, 47 y.o.   MRN: [id number] S: Feels better after HD O:BP (!) 173/88 (BP Location: Right Arm)   Pulse 76   Temp 98.1 F (36.7 C)   Resp 18   Ht 5\' 5"  (1.651 m)   Wt 66.1 kg   LMP 10/11/2019   SpO2 98%   BMI 24.25 kg/m   Intake/Output Summary (Last 24 hours) at 03/15/2023 1021 Last data filed at 03/15/2023 0918 Gross per 24 hour  Intake 408.69 ml  Output 0 ml  Net 408.69 ml   Intake/Output: I/O last 3 completed shifts: In: 111.7 [IV Piggyback:111.7] Out: 0   Intake/Output this shift:  Total I/O In: 297 [P.O.:297] Out: -  Weight change: 1.689 kg Gen:NAD CVS: RRR Resp:CTA Abd: +BS, soft, NT/ND Ext: no edema  Recent Labs  Lab 03/10/23 2041 03/13/23 1455 03/13/23 1701 03/14/23 0441 03/15/23 0416  NA 142 142  --  141 137  K 4.3 4.3  --  3.9 3.4*  CL 109 113*  --  115* 106  CO2 16* 16*  --  14* 24  GLUCOSE 138* 69*  --  56* 116*  BUN 67* 68*  --  68* 49*  CREATININE 9.33* 10.22*  --  10.17* 7.31*  ALBUMIN 1.7* 1.6*  --  <1.5* <1.5*  CALCIUM 8.3* 7.7*  --  7.2* 7.0*  PHOS  --   --  8.3* 8.7* 5.0*  AST 12* 13*  --   --   --   ALT 9 6  --   --   --    Liver Function Tests: Recent Labs  Lab 03/10/23 2041 03/13/23 1455 03/14/23 0441 03/15/23 0416  AST 12* 13*  --   --   ALT 9 6  --   --   ALKPHOS 57 54  --   --   BILITOT 0.4 0.8  --   --   PROT 5.3* 5.2*  --   --   ALBUMIN 1.7* 1.6* <1.5* <1.5*   Recent Labs  Lab 03/10/23 2041 03/13/23 1455  LIPASE 24 20   No results for input(s): "AMMONIA" in the last 168 hours. CBC: Recent Labs  Lab 03/10/23 2041 03/13/23 1455 03/14/23 0441 03/15/23 0416  WBC 10.2 9.0 7.7 8.3  HGB 9.5* 9.3* 7.2* 7.3*  HCT 29.0* 29.4* 22.5* 22.0*  MCV 75.3* 77.2* 74.5* 74.3*  PLT 257 273 227 209   Cardiac Enzymes: No results for input(s): "CKTOTAL", "CKMB", "CKMBINDEX", "TROPONINI" in the last 168 hours. CBG: Recent Labs  Lab 03/14/23 0743 03/14/23 0817 03/14/23 1614  03/14/23 2107 03/15/23 0806  GLUCAP 61* 149* 66* 130* 111*    Iron Studies:  Recent Labs    03/13/23 2135  IRON 36  TIBC 164*  FERRITIN 84   Studies/Results: IR Fluoro Guide CV Line Right  Result Date: 03/14/2023 INDICATION: ESRD requiring HD. EXAM: TUNNELED CENTRAL VENOUS HEMODIALYSIS CATHETER PLACEMENT WITH ULTRASOUND AND FLUOROSCOPIC GUIDANCE MEDICATIONS: Ancef 2 gm IV . The antibiotic was given in an appropriate time interval prior to skin puncture. ANESTHESIA/SEDATION: Moderate (conscious) sedation was employed during this procedure. A total of Versed 1 mg and Fentanyl 25 mcg was administered intravenously. Moderate Sedation Time: 13 minutes. The patient's level of consciousness and vital signs were monitored continuously by radiology nursing throughout the procedure under my direct supervision. FLUOROSCOPY TIME:  Fluoroscopic dose; 0 mGy COMPLICATIONS: None immediate. PROCEDURE: Informed written consent was obtained from the patient after a discussion of the risks,  benefits, and alternatives to treatment. Questions regarding the procedure were encouraged and answered. The RIGHT neck and chest were prepped with chlorhexidine in a sterile fashion, and a sterile drape was applied covering the operative field. Maximum barrier sterile technique with sterile gowns and gloves were used for the procedure. A timeout was performed prior to the initiation of the procedure. After creating a small venotomy incision, a micropuncture kit was utilized to access the internal jugular vein. Real-time ultrasound guidance was utilized for vascular access including the acquisition of a permanent ultrasound image documenting patency of the accessed vessel. The microwire was utilized to measure appropriate catheter length. A stiff Glidewire was advanced to the level of the IVC and the micropuncture sheath was exchanged for a peel-away sheath. A palindrome tunneled hemodialysis catheter measuring 19 cm from tip to cuff  was tunneled in a retrograde fashion from the anterior chest wall to the venotomy incision. The catheter was then placed through the peel-away sheath with tips ultimately positioned within the superior aspect of the right atrium. Final catheter positioning was confirmed and documented with a spot radiographic image. The catheter aspirates and flushes normally. The catheter was flushed with appropriate volume heparin dwells. The catheter exit site was secured with a 2-0 Ethilon retention suture. The venotomy incision was closed with Dermabond. Dressings were applied. The patient tolerated the procedure well without immediate post procedural complication. IMPRESSION: Successful placement of a 19 cm tip to cuff tunneled hemodialysis catheter via the RIGHT internal jugular vein The tip of the catheter is positioned within the proximal RIGHT atrium. The catheter is ready for immediate use. Roanna Banning, MD [facility] Electronically Signed   By: Roanna Banning M.D.   On: 03/14/2023 17:18   IR US Guide Vasc Access Right  Result Date: 03/14/2023 INDICATION: ESRD requiring HD. EXAM: TUNNELED CENTRAL VENOUS HEMODIALYSIS CATHETER PLACEMENT WITH ULTRASOUND AND FLUOROSCOPIC GUIDANCE MEDICATIONS: Ancef 2 gm IV . The antibiotic was given in an appropriate time interval prior to skin puncture. ANESTHESIA/SEDATION: Moderate (conscious) sedation was employed during this procedure. A total of Versed 1 mg and Fentanyl 25 mcg was administered intravenously. Moderate Sedation Time: 13 minutes. The patient's level of consciousness and vital signs were monitored continuously by radiology nursing throughout the procedure under my direct supervision. FLUOROSCOPY TIME:  Fluoroscopic dose; 0 mGy COMPLICATIONS: None immediate. PROCEDURE: Informed written consent was obtained from the patient after a discussion of the risks, benefits, and alternatives to treatment. Questions regarding  the procedure were encouraged and answered. The RIGHT neck and chest were prepped with chlorhexidine in a sterile fashion, and a sterile drape was applied covering the operative field. Maximum barrier sterile technique with sterile gowns and gloves were used for the procedure. A timeout was performed prior to the initiation of the procedure. After creating a small venotomy incision, a micropuncture kit was utilized to access the internal jugular vein. Real-time ultrasound guidance was utilized for vascular access including the acquisition of a permanent ultrasound image documenting patency of the accessed vessel. The microwire was utilized to measure appropriate catheter length. A stiff Glidewire was advanced to the level of the IVC and the micropuncture sheath was exchanged for a peel-away sheath. A palindrome tunneled hemodialysis catheter measuring 19 cm from tip to cuff was tunneled in a retrograde fashion from the anterior chest wall to the venotomy incision. The catheter was then placed through the peel-away sheath with tips ultimately positioned within the superior aspect of the right atrium.  Final catheter positioning was confirmed and documented with a spot radiographic image. The catheter aspirates and flushes normally. The catheter was flushed with appropriate volume heparin dwells. The catheter exit site was secured with a 2-0 Ethilon retention suture. The venotomy incision was closed with Dermabond. Dressings were applied. The patient tolerated the procedure well without immediate post procedural complication. IMPRESSION: Successful placement of a 19 cm tip to cuff tunneled hemodialysis catheter via the RIGHT internal jugular vein The tip of the catheter is positioned within the proximal RIGHT atrium. The catheter is ready for immediate use. Roanna Banning, MD [facility] Electronically Signed   By: Roanna Banning M.D.   On: 03/14/2023 17:18     (feeding supplement) PROSource Plus  30 mL Oral BID BM   carvedilol  6.25 mg Oral BID WC   Chlorhexidine Gluconate Cloth  6 each Topical Q0600   darbepoetin (ARANESP) injection - DIALYSIS  60 mcg Subcutaneous Q Fri-1800   sevelamer carbonate  1,600 mg Oral TID WC   sodium chloride flush  3 mL Intravenous Q12H    BMET    Component Value Date/Time   NA 137 03/15/2023 0416   NA 143 03/05/2022 1045   K 3.4 (L) 03/15/2023 0416   CL 106 03/15/2023 0416   CO2 24 03/15/2023 0416   GLUCOSE 116 (H) 03/15/2023 0416   BUN 49 (H) 03/15/2023 0416   BUN 21 03/05/2022 1045   CREATININE 7.31 (H) 03/15/2023 0416   CALCIUM 7.0 (L) 03/15/2023 0416   GFRNONAA 6 (L) 03/15/2023 0416   GFRAA >60 04/14/2020 1335   CBC    Component Value Date/Time   WBC 8.3 03/15/2023 0416   RBC 2.96 (L) 03/15/2023 0416   HGB 7.3 (L) 03/15/2023 0416   HGB 10.9 (L) 03/05/2022 1045   HCT 22.0 (L) 03/15/2023 0416   HCT 32.6 (L) 03/05/2022 1045   PLT 209 03/15/2023 0416   PLT 287 03/05/2022 1045   MCV 74.3 (L) 03/15/2023 0416   MCV 77 (L) 03/05/2022 1045   MCH 24.7 (L) 03/15/2023 0416   MCHC 33.2 03/15/2023 0416   RDW 16.9 (H) 03/15/2023 0416   RDW 14.0 03/05/2022 1045   LYMPHSABS 2.3 11/16/2022 0352   LYMPHSABS 3.0 03/05/2022 1045   MONOABS 0.9 11/16/2022 0352   EOSABS 0.3 11/16/2022 0352   EOSABS 0.3 03/05/2022 1045   BASOSABS 0.0 11/16/2022 0352   BASOSABS 0.0 03/05/2022 1045    Assessment/Plan:   ESRD - due to FSGS.  S/p [facility] 03/14/23 followed by her first HD session.  Plan for HD again today.  Discussed case with Dr. Randie Heinz who saw her 2 days ago and will arrange for AVG placement next week.  CLIP process started and due to insurance issues, she will need to go to [facility] in [facility].  Hep B core Ab is negative and awaiting TB test. HTN/volume - stable. Anemia of CKD Stage V - Hgb down to 7.2.  Started ESA and IV iron.  TSAT 22%, ferritin 84.  Transfuse for Hgb <7. Secondary  hyperparathyroidism/CKD-BMD - phos 8.7.  Started binders and follow. Vascular access - as outlined above Severe protein malnutrition - alb <1.5.  recommend protein supplementation.    Additional recommendations: - Dose all meds for creatinine clearance < 10 ml/min  - Unless absolutely necessary, no MRIs with gadolinium.  - Implement save arm precautions.  Prefer needle sticks in the dorsum of the hands or wrists.  No blood  pressure measurements in arm. - If blood transfusion is requested during hemodialysis sessions, please alert [place] prior to the session.  - Use synthetic opioids (Fentanyl/Dilaudid) if needed  Irena Cords, MD [facility]

## 2023-03-15 NOTE — Progress Notes (Signed)
   S: Patient is feeling much better this morning.  Diarrhea has resolved.  No nausea or vomiting.  Eating well.  Reports energy levels are improved.  Wants to do more out of bed activity.  O:   Vitals:   03/15/23 0514 03/15/23 0803  BP: (!) 181/82 (!) 173/88  Pulse: 81 76  Resp:  18  Temp: 98.2 F (36.8 C) 98.1 F (36.7 C)  SpO2: 100% 98%   Gen: Well-appearing woman, no distress Lungs: Unlabored, no tachypnea, saturating well on room air, no crackles Abd: Soft and nontender, nondistended Ext: Warm and well-perfused with no lower extremity edema Neuro: Alert and oriented, conversational, full strength in the upper and lower extremities Psych: Appropriate mood and affect, cheerful, not depressed or anxious appearing  A/P  Principal Problem:   ESRD on dialysis [facility]) Active Problems:   Essential hypertension   Atrial fibrillation (HCC)   Anemia   Colitis  ESRD: Temporary tunneled HD catheter placed in the right IJ and she has tolerated first session of HD well yesterday.  Further HD schedule per nephrology.  Appears euvolemic today and much less symptomatic.  Seems to be doing well with HD.  Vascular surgery planning on AV graft placement in the coming days.  Will also work towards outpatient HD center placement next week.  Atrial fibrillation: Appropriate heart rate today.  Continuing carvedilol 6.25 mg twice daily.  Restarting apixaban 2.5 mg twice daily today.  Chronic hypertension: Blood pressure elevated today, asymptomatic, but too high given that we are restarting anticoagulation.  Will restart amlodipine 10 mg daily.  Can use oral hydralazine 25 mg as needed for blood pressure above 180/110.   Diabetes: Blood glucose well-controlled, has not needed sliding scale insulin.  Colitis: Seems to be self-limited and now resolved.  Can discontinue enteric precautions.  DVT prophylaxis with apixaban Regular diet Anticipate discharge to home in 3-5 days pending AV graft surgery  and outpatient HD placement   Tyson Alias, MD 03/15/2023, 4:51 PM

## 2023-03-16 ENCOUNTER — Encounter (HOSPITAL_COMMUNITY): Payer: Self-pay | Admitting: Internal Medicine

## 2023-03-16 DIAGNOSIS — I120 Hypertensive chronic kidney disease with stage 5 chronic kidney disease or end stage renal disease: Secondary | ICD-10-CM | POA: Diagnosis not present

## 2023-03-16 DIAGNOSIS — E1122 Type 2 diabetes mellitus with diabetic chronic kidney disease: Secondary | ICD-10-CM | POA: Diagnosis not present

## 2023-03-16 DIAGNOSIS — I4891 Unspecified atrial fibrillation: Secondary | ICD-10-CM | POA: Diagnosis not present

## 2023-03-16 DIAGNOSIS — N186 End stage renal disease: Secondary | ICD-10-CM | POA: Diagnosis not present

## 2023-03-16 DIAGNOSIS — Z992 Dependence on renal dialysis: Secondary | ICD-10-CM | POA: Diagnosis not present

## 2023-03-16 DIAGNOSIS — D539 Nutritional anemia, unspecified: Secondary | ICD-10-CM

## 2023-03-16 LAB — RENAL FUNCTION PANEL
Albumin: <1.5 g/dL — ABNORMAL LOW (ref 3.5–5.0)
Albumin: <1.5 g/dL — ABNORMAL LOW (ref 3.5–5.0)
Anion gap: 7 (ref 5–15)
Anion gap: 10 (ref 5–15)
BUN: 30 mg/dL — ABNORMAL HIGH (ref 6–20)
BUN: 35 mg/dL — ABNORMAL HIGH (ref 6–20)
CO2: 23 mmol/L (ref 22–32)
CO2: 24 mmol/L (ref 22–32)
Calcium: 7.2 mg/dL — ABNORMAL LOW (ref 8.9–10.3)
Calcium: 7.4 mg/dL — ABNORMAL LOW (ref 8.9–10.3)
Chloride: 104 mmol/L (ref 98–111)
Chloride: 102 mmol/L (ref 98–111)
Creatinine, Ser: 4.28 mg/dL — ABNORMAL HIGH (ref 0.44–1.00)
Creatinine, Ser: 5.32 mg/dL — ABNORMAL HIGH (ref 0.44–1.00)
GFR, Estimated: 12 mL/min — ABNORMAL LOW (ref >60)
GFR, Estimated: 9 mL/min — ABNORMAL LOW (ref >60)
Glucose, Bld: 77 mg/dL (ref 70–99)
Glucose, Bld: 149 mg/dL — ABNORMAL HIGH (ref 70–99)
Phosphorus: 2.6 mg/dL (ref 2.5–4.6)
Phosphorus: 3.9 mg/dL (ref 2.5–4.6)
Potassium: 3.3 mmol/L — ABNORMAL LOW (ref 3.5–5.1)
Potassium: 4 mmol/L (ref 3.5–5.1)
Sodium: 134 mmol/L — ABNORMAL LOW (ref 135–145)
Sodium: 136 mmol/L (ref 135–145)

## 2023-03-16 LAB — GLUCOSE, CAPILLARY
Glucose-Capillary: 80 mg/dL (ref 70–99)
Glucose-Capillary: 108 mg/dL — ABNORMAL HIGH (ref 70–99)
Glucose-Capillary: 150 mg/dL — ABNORMAL HIGH (ref 70–99)
Glucose-Capillary: 149 mg/dL — ABNORMAL HIGH (ref 70–99)
Glucose-Capillary: 138 mg/dL — ABNORMAL HIGH (ref 70–99)

## 2023-03-16 LAB — CBC
HCT: 23.2 % — ABNORMAL LOW (ref 36.0–46.0)
Hemoglobin: 7.7 g/dL — ABNORMAL LOW (ref 12.0–15.0)
MCH: 24.7 pg — ABNORMAL LOW (ref 26.0–34.0)
MCHC: 33.2 g/dL (ref 30.0–36.0)
MCV: 74.4 fL — ABNORMAL LOW (ref 80.0–100.0)
Platelets: 231 10*3/uL (ref 150–400)
RBC: 3.12 MIL/uL — ABNORMAL LOW (ref 3.87–5.11)
RDW: 16.7 % — ABNORMAL HIGH (ref 11.5–15.5)
WBC: 11.8 10*3/uL — ABNORMAL HIGH (ref 4.0–10.5)
nRBC: 0 % (ref 0.0–0.2)

## 2023-03-16 MED ORDER — LOPERAMIDE HCL 2 MG PO CAPS
2.0000 mg | ORAL_CAPSULE | ORAL | Status: AC | PRN
  Administered 2023-03-16: 2 mg via ORAL
  Filled 2023-03-16: qty 1

## 2023-03-16 MED ORDER — HEPARIN SODIUM (PORCINE) 1000 UNIT/ML IJ SOLN
INTRAMUSCULAR | Status: AC
  Filled 2023-03-16: qty 4

## 2023-03-16 MED ORDER — LOPERAMIDE HCL 2 MG PO CAPS: 2.0000 mg | ORAL_CAPSULE | ORAL | Status: DC | PRN

## 2023-03-16 NOTE — Progress Notes (Addendum)
Hospital day#2 Subjective:    Overnight Events: HD last night, tolerated without concerns   Patient feeling much better this AM now that has had two dialysis sessions. She now is endorsing 3-4 loose stools not associated with abdominal pain or nausea. Has been able to tolerate PO intake. Has been able to get up from bed without lightheadedness. Eager to get vascular surgery and be discharged  Objective:  Vital signs in last 24 hours: Vitals:   03/16/23 0506 03/16/23 0514 03/16/23 0520 03/16/23 0731  BP: (!) 156/84 (!) 153/84 (!) 168/86 (!) 141/80  Pulse: 76 80 81 81  Resp: 11 13 14 16   Temp:  98.2 F (36.8 C) 98.2 F (36.8 C) 98 F (36.7 C)  TempSrc:  Oral Oral Oral  SpO2: 100% 100% 100% 100%  Weight:   66.7 kg   Height:       Supplemental O2: Room Air SpO2: 100 % O2 Flow Rate (L/min): 2 L/min Filed Weights   03/14/23 1353 03/16/23 0236 03/16/23 0520  Weight: 66.1 kg 67.5 kg 66.7 kg  Telemetry with normal sinus rhythm  Physical Exam:  Well-appearing woman resting comfortably in bed in no apparent distress Moist mucous membranes Regular rate and rhythm Pulmonary/Chest: normal work of breathing on room air, lungs clear to auscultation bilaterally.  No wheezing Abdominal: Normal BS, soft, non-tender, non-distended.  MSK: Trace pitting edema Neurological: alert & oriented x 3, moving all extremities Skin: warm and dry Psych: Pleasant mood and affect   Intake/Output Summary (Last 24 hours) at 03/16/2023 1346 Last data filed at 03/16/2023 1100 Gross per 24 hour  Intake 200 ml  Output 1000 ml  Net -800 ml   Net IO Since Admission: -391.31 mL [03/16/23 1346]  Pertinent Labs:    Latest Ref Rng & Units 03/16/2023    6:39 AM 03/15/2023    4:16 AM 03/14/2023    4:41 AM  CBC  WBC 4.0 - 10.5 K/uL 11.8  8.3  7.7   Hemoglobin 12.0 - 15.0 g/dL 7.7  7.3  7.2   Hematocrit 36.0 - 46.0 % 23.2  22.0  22.5   Platelets 150 - 400 K/uL 231  209  227        Latest Ref Rng & Units  03/16/2023    6:39 AM 03/15/2023    4:16 AM 03/14/2023    4:41 AM  CMP  Glucose 70 - 99 mg/dL 77  409  56   BUN 6 - 20 mg/dL 30  49  68   Creatinine 0.44 - 1.00 mg/dL 8.11  9.14  78.29   Sodium 135 - 145 mmol/L 134  137  141   Potassium 3.5 - 5.1 mmol/L 3.3  3.4  3.9   Chloride 98 - 111 mmol/L 104  106  115   CO2 22 - 32 mmol/L 23  24  14    Calcium 8.9 - 10.3 mg/dL 7.2  7.0  7.2     Imaging: No results found.  Assessment/Plan:   Principal Problem:   ESRD on dialysis [facility]) Active Problems:   Essential hypertension   Atrial fibrillation (HCC)   Anemia   Colitis  ESRD secondary to FSGS Microcytic anemia Patient received second dialysis session through [facility] on 03/15/2023.  Renal function stable.  Vascular surgery saw her yesterday with plans to undergo permanent dialysis access tomorrow 7/8.  Eliquis has been held by Dr. Henry Russel.  Will be n.p.o. at midnight. Outpatient hemodialysis will need to be done a  trial of dialysis in [facility].  Patient is euvolemic today; suspect will need an additional hemodialysis session in while inpatient prior to discharge. -Nephrology following, appreciate recommendations and guidance -HD today per nephrology note -Continue monitoring RFP -Permanent vascular access scheduled for 7/8 -N.p.o. at midnight -Monitor CBC and transfuse with hemoglobin goal above 7  Atrial fibrillation Rate controlled.  Held apixaban 2.5 mg twice daily in preparation for vascular surgery tomorrow 7/8 -Continue carvedilol 6.25 mg twice daily  Chronic hypertension Mildly elevated today, though asymptomatic.  Will continue therapy with carvedilol and amlodipine with as needed hydralazine if persistently elevated  Loose stools Intermittent without clear pattern.  Patient suspects this is in the setting of new renal medications/binders.  Will continue monitoring at this time for signs of hypovolemia or electrolyte disturbances -CTM -Imodium as needed  Diet: Renal VTE:  SCDs Code: Full  Dispo: Anticipated discharge to Home in 3 days pending surgery and hemodialysis.   Morene Crocker, MD Internal Medicine Resident PGY-2 Please contact the on call pager after 5 pm and on weekends at [phone].

## 2023-03-16 NOTE — Progress Notes (Addendum)
  Progress Note    03/16/2023 9:57 AM * No surgery date entered *  Subjective: States that she has had upset stomach  Vitals:   03/16/23 0520 03/16/23 0731  BP: (!) 168/86 (!) 141/80  Pulse: 81 81  Resp: 14 16  Temp: 98.2 F (36.8 C) 98 F (36.7 C)  SpO2: 100% 100%    Physical Exam: Awake alert and oriented Nonlabored respirations Left radial artery pulse is palpable at the wrist No obvious surface veins bilateral upper extremities  CBC    Component Value Date/Time   WBC 11.8 (H) 03/16/2023 0639   RBC 3.12 (L) 03/16/2023 0639   HGB 7.7 (L) 03/16/2023 0639   HGB 10.9 (L) 03/05/2022 1045   HCT 23.2 (L) 03/16/2023 0639   HCT 32.6 (L) 03/05/2022 1045   PLT 231 03/16/2023 0639   PLT 287 03/05/2022 1045   MCV 74.4 (L) 03/16/2023 0639   MCV 77 (L) 03/05/2022 1045   MCH 24.7 (L) 03/16/2023 0639   MCHC 33.2 03/16/2023 0639   RDW 16.7 (H) 03/16/2023 0639   RDW 14.0 03/05/2022 1045   LYMPHSABS 2.3 11/16/2022 0352   LYMPHSABS 3.0 03/05/2022 1045   MONOABS 0.9 11/16/2022 0352   EOSABS 0.3 11/16/2022 0352   EOSABS 0.3 03/05/2022 1045   BASOSABS 0.0 11/16/2022 0352   BASOSABS 0.0 03/05/2022 1045    BMET    Component Value Date/Time   NA 134 (L) 03/16/2023 0639   NA 143 03/05/2022 1045   K 3.3 (L) 03/16/2023 0639   CL 104 03/16/2023 0639   CO2 23 03/16/2023 0639   GLUCOSE 77 03/16/2023 0639   BUN 30 (H) 03/16/2023 0639   BUN 21 03/05/2022 1045   CREATININE 4.28 (H) 03/16/2023 0639   CALCIUM 7.2 (L) 03/16/2023 0639   GFRNONAA 12 (L) 03/16/2023 0639   GFRAA >60 04/14/2020 1335    INR    Component Value Date/Time   INR 1.0 03/13/2023 1701     Intake/Output Summary (Last 24 hours) at 03/16/2023 0957 Last data filed at 03/16/2023 0858 Gross per 24 hour  Intake 120 ml  Output 1000 ml  Net -880 ml     Assessment/plan:  47 y.o. female is now end-stage renal disease dialyzing via catheter.  She is now in need of permanent dialysis access and we are planning  tomorrow in the OR.  We discussed the need to be n.p.o. past midnight.  We discussed possible fistula versus more likely graft in the left upper extremity which is her nondominant arm.  She possibly does have a basilic vein that could be used for fistula creation.  All questions were answered and she is on the schedule for late morning or early afternoon tomorrow.  Eliquis held in preparation for surgical intervention.  Nolyn Swab C. Randie Heinz, MD [facility] [place] Office: [phone] Pager: [phone]  03/16/2023 9:57 AM

## 2023-03-16 NOTE — Progress Notes (Signed)
Received patient in bed to unit.  Alert and oriented.  Informed consent signed and in chart.   TX duration:2.30 hours  Patient tolerated well.  Transported back to the room  Alert, without acute distress.  Hand-off given to patient's nurse.   Access used: catheter Access issues: none  Total UF removed: 1000 ml Medication(s) given: none Post HD VS: 168/86 Post HD weight: 66.7 kg    03/16/23 0520  Vitals  Temp 98.2 F (36.8 C)  Temp Source Oral  BP (!) 168/86  MAP (mmHg) 109  BP Location Left Arm  BP Method Automatic  Patient Position (if appropriate) Lying  Pulse Rate 81  Pulse Rate Source Monitor  ECG Heart Rate 81  Resp 14  Oxygen Therapy  SpO2 100 %  O2 Device Room Air  O2 Flow Rate (L/min) 2 L/min  Patient Activity (if Appropriate) In bed  Pulse Oximetry Type Continuous  During Treatment Monitoring  Intra-Hemodialysis Comments See progress note (post rinseback)  Post Treatment  Dialyzer Clearance Lightly streaked  Duration of HD Treatment -hour(s) 2.3 hour(s)  Hemodialysis Intake (mL) 0 mL  Liters Processed 37.5  Fluid Removed (mL) 1000 mL  Tolerated HD Treatment Yes  Post-Hemodialysis Comments HD tx completed  as scheduled, tolerated well, no complaints.  AVG/AVF Arterial Site Held (minutes) 0 minutes  AVG/AVF Venous Site Held (minutes) 0 minutes  Note  Observations pt is alert oriented, vebally resposive. no complaints.  Hemodialysis Catheter Right Internal jugular Double lumen Permanent (Tunneled)  Placement Date/Time: 03/14/23 0941   Serial / Lot #: [id number]  Expiration Date: 09/09/27  Time Out: Correct patient;Correct site;Correct procedure  Maximum sterile barrier precautions: Hand hygiene;Cap;Mask;Sterile gown;Large sterile sheet;Sterile gl...  Site Condition No complications  Blue Lumen Status Flushed;Heparin locked;Dead end cap in place  Red Lumen Status Flushed;Heparin locked;Dead end cap in place  Purple Lumen Status N/A  Catheter fill  solution Heparin 1000 units/ml  Catheter fill volume (Arterial) 1.6 cc  Catheter fill volume (Venous) 1.6  Dressing Type Transparent  Dressing Status Antimicrobial disc in place  Drainage Description None  Post treatment catheter status Capped and Clamped

## 2023-03-16 NOTE — Plan of Care (Signed)

## 2023-03-16 NOTE — Progress Notes (Signed)
Patient ID: Stacey Lucas, female   DOB: May 20, 1976, 46 y.o.   MRN: [id number] S: Complaining of diarrhea this morning.3 O:BP (!) 141/80 (BP Location: Left Arm)   Pulse 81   Temp 98 F (36.7 C) (Oral)   Resp 16   Ht 5\' 5"  (1.651 m)   Wt 66.7 kg   LMP 10/11/2019   SpO2 100%   BMI 24.47 kg/m   Intake/Output Summary (Last 24 hours) at 03/16/2023 1018 Last data filed at 03/16/2023 0858 Gross per 24 hour  Intake 120 ml  Output 1000 ml  Net -880 ml   Intake/Output: I/O last 3 completed shifts: In: 408.7 [P.O.:297; IV Piggyback:111.7] Out: 1000 [Other:1000]  Intake/Output this shift:  Total I/O In: 120 [P.O.:120] Out: -  Weight change: 1.4 kg Gen: NAD CVS: RRR Resp:CTA Abd: +BS, soft, NT/nD Ext: no edema  Recent Labs  Lab 03/10/23 2041 03/13/23 1455 03/13/23 1701 03/14/23 0441 03/15/23 0416 03/16/23 0639  NA 142 142  --  141 137 134*  K 4.3 4.3  --  3.9 3.4* 3.3*  CL 109 113*  --  115* 106 104  CO2 16* 16*  --  14* 24 23  GLUCOSE 138* 69*  --  56* 116* 77  BUN 67* 68*  --  68* 49* 30*  CREATININE 9.33* 10.22*  --  10.17* 7.31* 4.28*  ALBUMIN 1.7* 1.6*  --  <1.5* <1.5* <1.5*  CALCIUM 8.3* 7.7*  --  7.2* 7.0* 7.2*  PHOS  --   --  8.3* 8.7* 5.0* 2.6  AST 12* 13*  --   --   --   --   ALT 9 6  --   --   --   --    Liver Function Tests: Recent Labs  Lab 03/10/23 2041 03/13/23 1455 03/14/23 0441 03/15/23 0416 03/16/23 0639  AST 12* 13*  --   --   --   ALT 9 6  --   --   --   ALKPHOS 57 54  --   --   --   BILITOT 0.4 0.8  --   --   --   PROT 5.3* 5.2*  --   --   --   ALBUMIN 1.7* 1.6* <1.5* <1.5* <1.5*   Recent Labs  Lab 03/10/23 2041 03/13/23 1455  LIPASE 24 20   No results for input(s): "AMMONIA" in the last 168 hours. CBC: Recent Labs  Lab 03/10/23 2041 03/13/23 1455 03/14/23 0441 03/15/23 0416 03/16/23 0639  WBC 10.2 9.0 7.7 8.3 11.8*  HGB 9.5* 9.3* 7.2* 7.3* 7.7*  HCT 29.0* 29.4* 22.5* 22.0* 23.2*  MCV 75.3* 77.2* 74.5* 74.3* 74.4*  PLT 257  273 227 209 231   Cardiac Enzymes: No results for input(s): "CKTOTAL", "CKMB", "CKMBINDEX", "TROPONINI" in the last 168 hours. CBG: Recent Labs  Lab 03/15/23 0806 03/15/23 1233 03/15/23 1544 03/15/23 2037 03/16/23 0734  GLUCAP 111* 107* 164* 119* 80    Iron Studies:  Recent Labs    03/13/23 2135  IRON 36  TIBC 164*  FERRITIN 84   Studies/Results: No results found.  (feeding supplement) PROSource Plus  30 mL Oral BID BM   amLODipine  10 mg Oral Daily   atorvastatin  80 mg Oral Daily   carvedilol  6.25 mg Oral BID WC   Chlorhexidine Gluconate Cloth  6 each Topical Q0600   darbepoetin (ARANESP) injection - DIALYSIS  60 mcg Subcutaneous Q Fri-1800   heparin sodium (porcine)  sevelamer carbonate  1,600 mg Oral TID WC   sodium chloride flush  3 mL Intravenous Q12H    BMET    Component Value Date/Time   NA 134 (L) 03/16/2023 0639   NA 143 03/05/2022 1045   K 3.3 (L) 03/16/2023 0639   CL 104 03/16/2023 0639   CO2 23 03/16/2023 0639   GLUCOSE 77 03/16/2023 0639   BUN 30 (H) 03/16/2023 0639   BUN 21 03/05/2022 1045   CREATININE 4.28 (H) 03/16/2023 0639   CALCIUM 7.2 (L) 03/16/2023 0639   GFRNONAA 12 (L) 03/16/2023 0639   GFRAA >60 04/14/2020 1335   CBC    Component Value Date/Time   WBC 11.8 (H) 03/16/2023 0639   RBC 3.12 (L) 03/16/2023 0639   HGB 7.7 (L) 03/16/2023 0639   HGB 10.9 (L) 03/05/2022 1045   HCT 23.2 (L) 03/16/2023 0639   HCT 32.6 (L) 03/05/2022 1045   PLT 231 03/16/2023 0639   PLT 287 03/05/2022 1045   MCV 74.4 (L) 03/16/2023 0639   MCV 77 (L) 03/05/2022 1045   MCH 24.7 (L) 03/16/2023 0639   MCHC 33.2 03/16/2023 0639   RDW 16.7 (H) 03/16/2023 0639   RDW 14.0 03/05/2022 1045   LYMPHSABS 2.3 11/16/2022 0352   LYMPHSABS 3.0 03/05/2022 1045   MONOABS 0.9 11/16/2022 0352   EOSABS 0.3 11/16/2022 0352   EOSABS 0.3 03/05/2022 1045   BASOSABS 0.0 11/16/2022 0352   BASOSABS 0.0 03/05/2022 1045    Assessment/Plan:   ESRD - due to FSGS.  S/p  [facility] 03/14/23 followed by her first HD session.  Plan for HD again today.  CLIP process started and due to insurance issues, she will need to go to [facility] in [facility].  Hep B core Ab is negative and awaiting TB test. HTN/volume - stable. Anemia of CKD Stage V - Hgb down to 7.2.  Started ESA and IV iron.  TSAT 22%, ferritin 84.  Transfuse for Hgb <7. Secondary hyperparathyroidism/CKD-BMD - phos 8.7.  Started binders and follow. Vascular access - as outlined above.  For AVG placement tomorrow.  Will hold HD until either later in day or on Tuesday pending CLIP schedule. Severe protein malnutrition - alb <1.5.  recommend protein supplementation.    Additional recommendations: - Dose all meds for creatinine clearance < 10 ml/min  - Unless absolutely necessary, no MRIs with gadolinium.  - Implement save arm precautions.  Prefer needle sticks in the dorsum of the hands or wrists.  No blood pressure measurements in arm. - If blood transfusion is requested during hemodialysis sessions, please alert [place] prior to the session.  - Use synthetic opioids (Fentanyl/Dilaudid) if needed  Irena Cords, MD [facility]

## 2023-03-17 ENCOUNTER — Other Ambulatory Visit: Payer: Self-pay

## 2023-03-17 ENCOUNTER — Inpatient Hospital Stay (HOSPITAL_COMMUNITY): Payer: 59 | Admitting: Anesthesiology

## 2023-03-17 ENCOUNTER — Ambulatory Visit: Payer: 59 | Admitting: Physical Therapy

## 2023-03-17 ENCOUNTER — Encounter (HOSPITAL_COMMUNITY): Payer: Self-pay | Admitting: Internal Medicine

## 2023-03-17 ENCOUNTER — Encounter (HOSPITAL_COMMUNITY)
Admission: EM | Disposition: A | Discharge status: Home / Self Care | Payer: Self-pay | Source: Home / Self Care | Attending: Internal Medicine

## 2023-03-17 DIAGNOSIS — I120 Hypertensive chronic kidney disease with stage 5 chronic kidney disease or end stage renal disease: Secondary | ICD-10-CM

## 2023-03-17 DIAGNOSIS — F17210 Nicotine dependence, cigarettes, uncomplicated: Secondary | ICD-10-CM

## 2023-03-17 DIAGNOSIS — D539 Nutritional anemia, unspecified: Secondary | ICD-10-CM

## 2023-03-17 DIAGNOSIS — N186 End stage renal disease: Secondary | ICD-10-CM | POA: Diagnosis not present

## 2023-03-17 DIAGNOSIS — Z7901 Long term (current) use of anticoagulants: Secondary | ICD-10-CM

## 2023-03-17 DIAGNOSIS — I4891 Unspecified atrial fibrillation: Secondary | ICD-10-CM

## 2023-03-17 DIAGNOSIS — Z992 Dependence on renal dialysis: Secondary | ICD-10-CM

## 2023-03-17 HISTORY — PX: AV FISTULA PLACEMENT: SHX1204

## 2023-03-17 LAB — GLUCOSE, CAPILLARY
Glucose-Capillary: 156 mg/dL — ABNORMAL HIGH (ref 70–99)
Glucose-Capillary: 98 mg/dL (ref 70–99)
Glucose-Capillary: 82 mg/dL (ref 70–99)
Glucose-Capillary: 84 mg/dL (ref 70–99)
Glucose-Capillary: 75 mg/dL (ref 70–99)
Glucose-Capillary: 155 mg/dL — ABNORMAL HIGH (ref 70–99)

## 2023-03-17 LAB — CBC
HCT: 21.8 % — ABNORMAL LOW (ref 36.0–46.0)
HCT: 23.4 % — ABNORMAL LOW (ref 36.0–46.0)
Hemoglobin: 7 g/dL — ABNORMAL LOW (ref 12.0–15.0)
Hemoglobin: 7.5 g/dL — ABNORMAL LOW (ref 12.0–15.0)
MCH: 24 pg — ABNORMAL LOW (ref 26.0–34.0)
MCH: 24.4 pg — ABNORMAL LOW (ref 26.0–34.0)
MCHC: 32.1 g/dL (ref 30.0–36.0)
MCHC: 32.1 g/dL (ref 30.0–36.0)
MCV: 74.7 fL — ABNORMAL LOW (ref 80.0–100.0)
MCV: 76.2 fL — ABNORMAL LOW (ref 80.0–100.0)
Platelets: 223 10*3/uL (ref 150–400)
Platelets: 225 10*3/uL (ref 150–400)
RBC: 2.92 MIL/uL — ABNORMAL LOW (ref 3.87–5.11)
RBC: 3.07 MIL/uL — ABNORMAL LOW (ref 3.87–5.11)
RDW: 17.1 % — ABNORMAL HIGH (ref 11.5–15.5)
RDW: 16.7 % — ABNORMAL HIGH (ref 11.5–15.5)
WBC: 11.2 10*3/uL — ABNORMAL HIGH (ref 4.0–10.5)
WBC: 13.2 10*3/uL — ABNORMAL HIGH (ref 4.0–10.5)
nRBC: 0 % (ref 0.0–0.2)
nRBC: 0 % (ref 0.0–0.2)

## 2023-03-17 LAB — RENAL FUNCTION PANEL
Albumin: <1.5 g/dL — ABNORMAL LOW (ref 3.5–5.0)
Anion gap: 8 (ref 5–15)
BUN: 39 mg/dL — ABNORMAL HIGH (ref 6–20)
CO2: 24 mmol/L (ref 22–32)
Calcium: 7.2 mg/dL — ABNORMAL LOW (ref 8.9–10.3)
Chloride: 105 mmol/L (ref 98–111)
Creatinine, Ser: 5.74 mg/dL — ABNORMAL HIGH (ref 0.44–1.00)
GFR, Estimated: 9 mL/min — ABNORMAL LOW (ref >60)
Glucose, Bld: 111 mg/dL — ABNORMAL HIGH (ref 70–99)
Phosphorus: 4.4 mg/dL (ref 2.5–4.6)
Potassium: 3.8 mmol/L (ref 3.5–5.1)
Sodium: 137 mmol/L (ref 135–145)

## 2023-03-17 LAB — TYPE AND SCREEN
ABO/RH(D): O POS
Antibody Screen: NEGATIVE

## 2023-03-17 LAB — HEPATITIS B CORE ANTIBODY, TOTAL: Hep B Core Total Ab: NONREACTIVE

## 2023-03-17 SURGERY — ARTERIOVENOUS (AV) FISTULA CREATION
Anesthesia: Monitor Anesthesia Care | Site: Arm Upper | Laterality: Left

## 2023-03-17 MED ORDER — OXYCODONE HCL 5 MG PO TABS: 5.0000 mg | ORAL_TABLET | Freq: Once | ORAL | Status: DC | PRN

## 2023-03-17 MED ORDER — FENTANYL CITRATE (PF) 100 MCG/2ML IJ SOLN: 25.0000 ug | INTRAMUSCULAR | Status: DC | PRN

## 2023-03-17 MED ORDER — CHLORHEXIDINE GLUCONATE 0.12 % MT SOLN
15.0000 mL | Freq: Once | OROMUCOSAL | Status: AC
  Administered 2023-03-17: 15 mL via OROMUCOSAL
  Filled 2023-03-17: qty 15

## 2023-03-17 MED ORDER — HEMOSTATIC AGENTS (NO CHARGE) OPTIME
TOPICAL | Status: DC | PRN
  Administered 2023-03-17: 1 via TOPICAL

## 2023-03-17 MED ORDER — HEPARIN SODIUM (PORCINE) 1000 UNIT/ML IJ SOLN
INTRAMUSCULAR | Status: AC
  Filled 2023-03-17: qty 10

## 2023-03-17 MED ORDER — PROMETHAZINE HCL 25 MG/ML IJ SOLN: 6.2500 mg | INTRAMUSCULAR | Status: DC | PRN

## 2023-03-17 MED ORDER — PROPOFOL 500 MG/50ML IV EMUL
INTRAVENOUS | Status: DC | PRN
  Administered 2023-03-17: 100 ug/kg/min via INTRAVENOUS

## 2023-03-17 MED ORDER — HEPARIN 6000 UNIT IRRIGATION SOLUTION
Status: DC | PRN
  Administered 2023-03-17: 1

## 2023-03-17 MED ORDER — HEPARIN 6000 UNIT IRRIGATION SOLUTION
Status: AC
  Filled 2023-03-17: qty 500

## 2023-03-17 MED ORDER — OXYCODONE HCL 5 MG PO TABS
5.0000 mg | ORAL_TABLET | ORAL | Status: DC | PRN
  Administered 2023-03-17: 5 mg via ORAL
  Filled 2023-03-17: qty 1

## 2023-03-17 MED ORDER — LIDOCAINE 2% (20 MG/ML) 5 ML SYRINGE
INTRAMUSCULAR | Status: AC
  Filled 2023-03-17: qty 5

## 2023-03-17 MED ORDER — SODIUM CHLORIDE 0.9 % IV SOLN: INTRAVENOUS | Status: DC

## 2023-03-17 MED ORDER — FENTANYL CITRATE (PF) 100 MCG/2ML IJ SOLN
INTRAMUSCULAR | Status: AC
  Filled 2023-03-17: qty 2

## 2023-03-17 MED ORDER — ROPIVACAINE HCL 5 MG/ML IJ SOLN
INTRAMUSCULAR | Status: DC | PRN
  Administered 2023-03-17: 30 mL via PERINEURAL

## 2023-03-17 MED ORDER — MIDAZOLAM HCL 2 MG/2ML IJ SOLN: 2.0000 mg | Freq: Once | INTRAMUSCULAR | Status: AC

## 2023-03-17 MED ORDER — ACETAMINOPHEN 500 MG PO TABS
1000.0000 mg | ORAL_TABLET | Freq: Once | ORAL | Status: AC
  Administered 2023-03-17: 1000 mg via ORAL
  Filled 2023-03-17: qty 2

## 2023-03-17 MED ORDER — ORAL CARE MOUTH RINSE: 15.0000 mL | Freq: Once | OROMUCOSAL | Status: AC

## 2023-03-17 MED ORDER — SEVELAMER CARBONATE 800 MG PO TABS
800.0000 mg | ORAL_TABLET | Freq: Three times a day (TID) | ORAL | Status: DC
  Administered 2023-03-17 – 2023-03-19 (×4): 800 mg via ORAL
  Filled 2023-03-17 (×5): qty 1

## 2023-03-17 MED ORDER — MIDAZOLAM HCL 2 MG/2ML IJ SOLN
INTRAMUSCULAR | Status: AC
  Administered 2023-03-17: 2 mg via INTRAVENOUS
  Filled 2023-03-17: qty 2

## 2023-03-17 MED ORDER — CEFAZOLIN SODIUM-DEXTROSE 2-4 GM/100ML-% IV SOLN
2.0000 g | Freq: Once | INTRAVENOUS | Status: AC
  Administered 2023-03-17: 2 g via INTRAVENOUS
  Filled 2023-03-17: qty 100

## 2023-03-17 MED ORDER — STERILE WATER FOR IRRIGATION IR SOLN
Status: DC | PRN
  Administered 2023-03-17: 1000 mL

## 2023-03-17 MED ORDER — LIDOCAINE 2% (20 MG/ML) 5 ML SYRINGE
INTRAMUSCULAR | Status: DC | PRN
  Administered 2023-03-17: 60 mg via INTRAVENOUS

## 2023-03-17 MED ORDER — 0.9 % SODIUM CHLORIDE (POUR BTL) OPTIME
TOPICAL | Status: DC | PRN
  Administered 2023-03-17: 1000 mL

## 2023-03-17 MED ORDER — HEPARIN SODIUM (PORCINE) 1000 UNIT/ML IJ SOLN
INTRAMUSCULAR | Status: DC | PRN
  Administered 2023-03-17: 2000 [IU] via INTRAVENOUS

## 2023-03-17 MED ORDER — OXYCODONE HCL 5 MG/5ML PO SOLN: 5.0000 mg | Freq: Once | ORAL | Status: DC | PRN

## 2023-03-17 SURGICAL SUPPLY — 38 items
ADH SKN CLS APL DERMABOND .7 (GAUZE/BANDAGES/DRESSINGS) ×1
ARMBAND PINK RESTRICT EXTREMIT (MISCELLANEOUS) ×1 IMPLANT
BAG COUNTER SPONGE SURGICOUNT (BAG) ×1 IMPLANT
BAG SPNG CNTER NS LX DISP (BAG) ×1
BLADE CLIPPER SURG (BLADE) ×1 IMPLANT
BNDG CMPR STD VLCR NS LF 5.8X4 (GAUZE/BANDAGES/DRESSINGS) ×1
BNDG ELASTIC 4X5.8 VLCR NS LF (GAUZE/BANDAGES/DRESSINGS) IMPLANT
BNDG ELASTIC 4X5.8 VLCR STR LF (GAUZE/BANDAGES/DRESSINGS) ×1 IMPLANT
CANISTER SUCT 3000ML PPV (MISCELLANEOUS) ×1 IMPLANT
CLIP TI MEDIUM 6 (CLIP) ×2 IMPLANT
CLIP TI WIDE RED SMALL 6 (CLIP) ×1 IMPLANT
COVER PROBE W GEL 5X96 (DRAPES) ×1 IMPLANT
DERMABOND ADVANCED .7 DNX12 (GAUZE/BANDAGES/DRESSINGS) ×1 IMPLANT
ELECT REM PT RETURN 9FT ADLT (ELECTROSURGICAL) ×1
ELECTRODE REM PT RTRN 9FT ADLT (ELECTROSURGICAL) ×1 IMPLANT
GLOVE BIOGEL PI IND STRL 8 (GLOVE) ×1 IMPLANT
GOWN STRL REUS W/ TWL LRG LVL3 (GOWN DISPOSABLE) ×2 IMPLANT
GOWN STRL REUS W/TWL 2XL LVL3 (GOWN DISPOSABLE) ×2 IMPLANT
GOWN STRL REUS W/TWL LRG LVL3 (GOWN DISPOSABLE) ×3
HEMOSTAT SNOW SURGICEL 2X4 (HEMOSTASIS) IMPLANT
KIT BASIN OR (CUSTOM PROCEDURE TRAY) ×1 IMPLANT
KIT TURNOVER KIT B (KITS) ×1 IMPLANT
NS IRRIG 1000ML POUR BTL (IV SOLUTION) ×1 IMPLANT
PACK CV ACCESS (CUSTOM PROCEDURE TRAY) ×1 IMPLANT
PAD ARMBOARD 7.5X6 YLW CONV (MISCELLANEOUS) ×2 IMPLANT
SLING ARM FOAM STRAP LRG (SOFTGOODS) IMPLANT
SLING ARM FOAM STRAP MED (SOFTGOODS) IMPLANT
SPIKE FLUID TRANSFER (MISCELLANEOUS) ×1 IMPLANT
SUT MNCRL AB 4-0 PS2 18 (SUTURE) ×1 IMPLANT
SUT PROLENE 6 0 BV (SUTURE) ×1 IMPLANT
SUT PROLENE 7 0 BV 1 (SUTURE) IMPLANT
SUT SILK 2 0 SH (SUTURE) IMPLANT
SUT SILK 3 0 SH CR/8 (SUTURE) ×1 IMPLANT
SUT VIC AB 3-0 SH 27 (SUTURE) ×1
SUT VIC AB 3-0 SH 27X BRD (SUTURE) ×1 IMPLANT
TOWEL GREEN STERILE (TOWEL DISPOSABLE) ×1 IMPLANT
UNDERPAD 30X36 HEAVY ABSORB (UNDERPADS AND DIAPERS) ×1 IMPLANT
WATER STERILE IRR 1000ML POUR (IV SOLUTION) ×1 IMPLANT

## 2023-03-17 NOTE — Discharge Instructions (Signed)
[facility]  Discharge Instructions  AV Fistula or Graft Surgery for Dialysis Access  Please refer to the following instructions for your post-procedure care. Your surgeon or physician assistant will discuss any changes with you.  Activity  You may drive the day following your surgery, if you are comfortable and no longer taking prescription pain medication. Resume full activity as the soreness in your incision resolves.  Bathing/Showering  You may shower after you go home. Keep your incision dry for 48 hours. Do not soak in a bathtub, hot tub, or swim until the incision heals completely. You may not shower if you have a hemodialysis catheter.  Incision Care  Clean your incision with mild soap and water after 48 hours. Pat the area dry with a clean towel. You do not need a bandage unless otherwise instructed. Do not apply any ointments or creams to your incision. You may have skin glue on your incision. Do not peel it off. It will come off on its own in about one week. Your arm may swell a bit after surgery. To reduce swelling use pillows to elevate your arm so it is above your heart. Your doctor will tell you if you need to lightly wrap your arm with an ACE bandage.  Diet  Resume your normal diet. There are not special food restrictions following this procedure. In order to heal from your surgery, it is CRITICAL to get adequate nutrition. Your body requires vitamins, minerals, and protein. Vegetables are the best source of vitamins and minerals. Vegetables also provide the perfect balance of protein. Processed food has little nutritional value, so try to avoid this.  Medications  Resume taking all of your medications. If your incision is causing pain, you may take over-the counter pain relievers such as acetaminophen (Tylenol). If you were prescribed a stronger pain medication, please be aware these medications can cause nausea and constipation. Prevent  nausea by taking the medication with a snack or meal. Avoid constipation by drinking plenty of fluids and eating foods with high amount of fiber, such as fruits, vegetables, and grains.  Do not take Tylenol if you are taking prescription pain medications.  Follow up Your surgeon may want to see you in the office following your access surgery. If so, this will be arranged at the time of your surgery.  Please call us immediately for any of the following conditions:  Increased pain, redness, drainage (pus) from your incision site Fever of 101 degrees or higher Severe or worsening pain at your incision site Hand pain or numbness.  Reduce your risk of vascular disease:  Stop smoking. If you would like help, call [facility] at 1-800-QUIT-NOW ((605) 421-8808) or [place] at [phone]  Manage your cholesterol Maintain a desired weight Control your diabetes Keep your blood pressure down  Dialysis  It will take several weeks to several months for your new dialysis access to be ready for use. Your surgeon will determine when it is okay to use it. Your nephrologist will continue to direct your dialysis. You can continue to use your Permcath until your new access is ready for use.   03/17/2023 Stacey Lucas [id number] Jan 20, 1976  Surgeon(s): Victorino Sparrow, MD  Procedure(s): LEFT ARM BRACHIOCEPHALIC ARTERIOVENOUS (AV) FISTULA CREATION   May stick graft immediately   May stick graft on designated area only:   X Do not stick left AV fistula for 12 weeks    If you have any questions, please call the office  at [phone].

## 2023-03-17 NOTE — Op Note (Signed)
    NAME: Stacey Lucas    MRN: [id number] DOB: 1976/08/25    DATE OF OPERATION: 03/17/2023  PREOP DIAGNOSIS:    Incisional disease requiring dialysis  POSTOP DIAGNOSIS:    Same  PROCEDURE:    Left arm brachiocephalic fistula  SURGEON: Victorino Sparrow  ASSIST: -  ANESTHESIA: Moderate, block  EBL: 15 mL  INDICATIONS:    Stacey Lucas is a 47 y.o. female with end-stage renal disease currently being dialyzed through a tunneled HD line.  She needs long-term HD access.  After discussing risk and benefits of fistula versus graft, she elected to proceed.  Plan will be to assess veins IntraOp prior to moving forward with graft.  FINDINGS:   4 mm cephalic vein 3 mm brachial artery  TECHNIQUE:   The patient was brought to the operating room and placed in supine position. The left arm was prepped and draped in standard fashion. IV antibiotics were administered. A timeout was performed.   The case began with ultrasound insonation of the brachial artery and cephalic vein, which demonstrated sufficient size at the antecubital fossa for arteriovenous fistula.   A transverse incision was made below the elbow creese in the antecubital fossa. The  cephalic vein was isolated for 3 cm in length. Next the aponeurosis was partially released and the brachial artery secured with a vessel loop. The patient was heparinized. The cephalic vein was transected and ligated distally with a 2-0 silk stick-tie. The vein was dilated with coronary dilators and flushed with heparin saline. Vascular clamps were placed proximally and distally on the brachial artery and a 5 mm arteriotomy was created on the brachial artery. This was flushed with heparin saline.  An anastomosis was created in end to side fashion on the brachial artery using running 6-0 Prolene suture.  Prior to completing the anastomosis, the vessels were flushed and the suture line was tied down. There was an excellent thrill in the cephalic vein from  the anastomosis to the mid upper bicipital region. The patient had a multiphasic radial and ulnar signal. He had an excellent doppler signal in the fistula. The incision was irrigated and hemostasis achieved with cautery and suture. The deeper tissue was closed with 3-0 Vicryl and the skin closed with 4-0 Monocryl.  Dermabond was applied to the incisions. The patient was transferred to PACU in stable condition.  Given the complexity of the case,  the assistant was necessary in order to expedient the procedure and safely perform the technical aspects of the operation.  The assistant provided traction and countertraction to assist with exposure of the artery and vein.  They also assisted with suture ligation of multiple venous branches.  They played a critical role in the anastomosis. These skills, especially following the Prolene suture for the anastomosis, could not have been adequately performed by a scrub tech assistant.    Ladonna Snide, MD [facility] DATE OF DICTATION:   03/17/2023

## 2023-03-17 NOTE — Transfer of Care (Signed)
Immediate Anesthesia Transfer of Care Note  Patient: Stacey Lucas  Procedure(s) Performed: LEFT ARM BRACHIOCEPHALIC ARTERIOVENOUS (AV) FISTULA CREATION (Left: Arm Upper)  Patient Location: PACU  Anesthesia Type:MAC combined with regional for post-op pain  Level of Consciousness: awake, alert , and oriented  Airway & Oxygen Therapy: Patient Spontanous Breathing  Post-op Assessment: Report given to RN, Post -op Vital signs reviewed and stable, Patient moving all extremities, and Patient able to stick tongue midline  Post vital signs: Reviewed  Last Vitals:  Vitals Value Taken Time  BP 154/74 03/17/23 1325  Temp 97.6   Pulse 77 03/17/23 1326  Resp 15 03/17/23 1326  SpO2 100 % 03/17/23 1326  Vitals shown include unvalidated device data.  Last Pain:  Vitals:   03/17/23 1130  TempSrc:   PainSc: 0-No pain         Complications: No notable events documented.

## 2023-03-17 NOTE — Progress Notes (Signed)
Additional clinicals faxed to [facility] with [facility] out-pt HD for referral for [facility] in [facility]. Will await clinic acceptance and final schedule. Will assist as needed.   Olivia Canter Renal Navigator [phone]

## 2023-03-17 NOTE — Anesthesia Procedure Notes (Signed)
Anesthesia Regional Block: Supraclavicular block   Pre-Anesthetic Checklist: , timeout performed,  Correct Patient, Correct Site, Correct Laterality,  Correct Procedure, Correct Position, site marked,  Risks and benefits discussed,  Surgical consent,  Pre-op evaluation,  At surgeon's request and post-op pain management  Laterality: Left  Prep: chloraprep       Needles:  Injection technique: Single-shot  Needle Type: Echogenic Stimulator Needle     Needle Length: 9cm  Needle Gauge: 21     Additional Needles:   Procedures:,,,, ultrasound used (permanent image in chart),,    Narrative:  Start time: 03/17/2023 11:32 AM End time: 03/17/2023 11:35 AM Injection made incrementally with aspirations every 5 mL.  Performed by: Personally  Anesthesiologist: Linton Rump, MD  Additional Notes: Discussed risks and benefits of nerve block including, but not limited to, prolonged and/or permanent nerve injury involving sensory and/or motor function. Monitors were applied and a time-out was performed. The nerve and associated structures were visualized under ultrasound guidance. After negative aspiration, local anesthetic was slowly injected around the nerve. There was no evidence of high pressure during the procedure. There were no paresthesias. VSS remained stable and the patient tolerated the procedure well.

## 2023-03-17 NOTE — Anesthesia Postprocedure Evaluation (Signed)
Anesthesia Post Note  Patient: Stacey Lucas  Procedure(s) Performed: LEFT ARM BRACHIOCEPHALIC ARTERIOVENOUS (AV) FISTULA CREATION (Left: Arm Upper)     Patient location during evaluation: PACU Anesthesia Type: MAC and Regional Level of consciousness: awake Pain management: pain level controlled Vital Signs Assessment: post-procedure vital signs reviewed and stable Respiratory status: spontaneous breathing, nonlabored ventilation and respiratory function stable Cardiovascular status: stable and blood pressure returned to baseline Postop Assessment: no apparent nausea or vomiting Anesthetic complications: no   No notable events documented.  Last Vitals:  Vitals:   03/17/23 1400 03/17/23 1426  BP: 125/71 120/70  Pulse: 69 68  Resp: 12 18  Temp: 36.4 C 36.8 C  SpO2: 100% 100%    Last Pain:  Vitals:   03/17/23 1426  TempSrc: Oral  PainSc: 0-No pain                 Linton Rump

## 2023-03-17 NOTE — Anesthesia Preprocedure Evaluation (Addendum)
Anesthesia Evaluation  Patient identified by MRN, date of birth, ID band Patient awake    Reviewed: Allergy & Precautions, NPO status , Patient's Chart, lab work & pertinent test results  History of Anesthesia Complications Negative for: history of anesthetic complications  Airway Mallampati: III  TM Distance: >3 FB Neck ROM: Full    Dental  (+) Upper Dentures, Dental Advisory Given   Pulmonary neg shortness of breath, neg sleep apnea, neg COPD, neg recent URI, Current Smoker   Pulmonary exam normal breath sounds clear to auscultation       Cardiovascular hypertension (amlodipine, carvedilol), Pt. on medications and Pt. on home beta blockers (-) angina (-) Past MI, (-) Cardiac Stents and (-) CABG + dysrhythmias Atrial Fibrillation  Rhythm:Regular Rate:Normal  HLD  TTE 11/15/2022: IMPRESSIONS     1. Left ventricular ejection fraction, by estimation, is 60 to 65%. The  left ventricle has normal function. The left ventricle has no regional  wall motion abnormalities. Left ventricular diastolic parameters are  consistent with Grade I diastolic  dysfunction (impaired relaxation).   2. Right ventricular systolic function is normal. The right ventricular  size is normal. There is normal pulmonary artery systolic pressure. The  estimated right ventricular systolic pressure is 18.7 mmHg.   3. Left atrial size was severely dilated.   4. The mitral valve is normal in structure. No evidence of mitral valve  regurgitation. No evidence of mitral stenosis.   5. The aortic valve is tricuspid. Aortic valve regurgitation is not  visualized. No aortic stenosis is present.   6. The inferior vena cava is normal in size with greater than 50%  respiratory variability, suggesting right atrial pressure of 3 mmHg.     Neuro/Psych  Headaches, neg Seizures PSYCHIATRIC DISORDERS Anxiety Depression     Neuromuscular disease (sciatica) CVA (right-sided  weakness), Residual Symptoms    GI/Hepatic ,GERD  ,,(+)     substance abuse  cocaine use  Endo/Other  diabetes, Type 2  Right thyroid nodule  Renal/GU ESRF and DialysisRenal disease (secondary to FSGS)     Musculoskeletal  (+) Arthritis , Osteoarthritis,    Abdominal   Peds  Hematology  (+) Blood dyscrasia, anemia   Anesthesia Other Findings K 3.8  Hgb 7.0  Reproductive/Obstetrics                             Anesthesia Physical Anesthesia Plan  ASA: 3  Anesthesia Plan: MAC and Regional   Post-op Pain Management:    Induction: Intravenous  PONV Risk Score and Plan: 1 and Propofol infusion, Ondansetron and Treatment may vary due to age or medical condition  Airway Management Planned: Natural Airway and Simple Face Mask  Additional Equipment:   Intra-op Plan:   Post-operative Plan:   Informed Consent: I have reviewed the patients History and Physical, chart, labs and discussed the procedure including the risks, benefits and alternatives for the proposed anesthesia with the patient or authorized representative who has indicated his/her understanding and acceptance.     Dental advisory given  Plan Discussed with: CRNA and Anesthesiologist  Anesthesia Plan Comments: (Discussed potential risks of nerve blocks including, but not limited to, infection, bleeding, nerve damage, seizures, pneumothorax, respiratory depression, and potential failure of the block. Alternatives to nerve blocks discussed. All questions answered.  Discussed with patient risks of MAC including, but not limited to, minor pain or discomfort, hearing people in the room, and possible need for backup  general anesthesia. Risks for general anesthesia also discussed including, but not limited to, sore throat, hoarse voice, chipped/damaged teeth, injury to vocal cords, nausea and vomiting, allergic reactions, lung infection, heart attack, stroke, and death. All questions  answered. )        Anesthesia Quick Evaluation

## 2023-03-17 NOTE — Progress Notes (Signed)
Pt NPO at MN, spot check [id number]. Call bell in reach, family at bedside

## 2023-03-17 NOTE — Progress Notes (Addendum)
  Progress Note    03/17/2023 7:15 AM Hospital Day 4  Subjective:  sleeping, wakes easily.  No questions.  Afebrile   Vitals:   03/16/23 1948 03/17/23 0359  BP: (!) 140/76 (!) 156/74  Pulse: 84 81  Resp: 15 16  Temp: 97.9 F (36.6 C)   SpO2: 100% 100%    Physical Exam: General:  no distress Lungs:  non labored Extremities:  IV right forearm.  CBC    Component Value Date/Time   WBC 11.2 (H) 03/17/2023 0542   RBC 2.92 (L) 03/17/2023 0542   HGB 7.0 (L) 03/17/2023 0542   HGB 10.9 (L) 03/05/2022 1045   HCT 21.8 (L) 03/17/2023 0542   HCT 32.6 (L) 03/05/2022 1045   PLT 223 03/17/2023 0542   PLT 287 03/05/2022 1045   MCV 74.7 (L) 03/17/2023 0542   MCV 77 (L) 03/05/2022 1045   MCH 24.0 (L) 03/17/2023 0542   MCHC 32.1 03/17/2023 0542   RDW 17.1 (H) 03/17/2023 0542   RDW 14.0 03/05/2022 1045   LYMPHSABS 2.3 11/16/2022 0352   LYMPHSABS 3.0 03/05/2022 1045   MONOABS 0.9 11/16/2022 0352   EOSABS 0.3 11/16/2022 0352   EOSABS 0.3 03/05/2022 1045   BASOSABS 0.0 11/16/2022 0352   BASOSABS 0.0 03/05/2022 1045    BMET    Component Value Date/Time   NA 137 03/17/2023 0542   NA 143 03/05/2022 1045   K 3.8 03/17/2023 0542   CL 105 03/17/2023 0542   CO2 24 03/17/2023 0542   GLUCOSE 111 (H) 03/17/2023 0542   BUN 39 (H) 03/17/2023 0542   BUN 21 03/05/2022 1045   CREATININE 5.74 (H) 03/17/2023 0542   CALCIUM 7.2 (L) 03/17/2023 0542   GFRNONAA 9 (L) 03/17/2023 0542   GFRAA >60 04/14/2020 1335    INR    Component Value Date/Time   INR 1.0 03/13/2023 1701     Intake/Output Summary (Last 24 hours) at 03/17/2023 0715 Last data filed at 03/16/2023 1637 Gross per 24 hour  Intake 320 ml  Output --  Net 320 ml     Assessment/Plan:  47 y.o. female now with ESRD in need of permanent HD access  Hospital Day 4  -pt for left arm access AVF vs AVG later today.  Pt without questions. -discussed continue npo.     Doreatha Massed, PA-C [facility] [phone] 03/17/2023 7:15 AM   VASCULAR STAFF ADDENDUM: I have independently interviewed and examined the patient. I agree with the above.  ESRD requiring dialysis. Mapping demonstrates brachiobasilic v AVG. Will assess intra-op.  After discussing the risk sand benefits. Deidrea elected to proceed.   Fara Olden, MD [facility] Phone Number: [phone] 03/17/2023 11:39 AM

## 2023-03-17 NOTE — Anesthesia Procedure Notes (Signed)
Procedure Name: MAC Date/Time: 03/17/2023 12:05 PM  Performed by: Cy Blamer, CRNAPre-anesthesia Checklist: Patient being monitored, Suction available, Emergency Drugs available and Patient identified Patient Re-evaluated:Patient Re-evaluated prior to induction Oxygen Delivery Method: Simple face mask Induction Type: IV induction

## 2023-03-17 NOTE — Progress Notes (Signed)
Hospital day#3 Subjective:  Summary: Stacey Lucas is a 25 female with PMH of ischemic infarct (2023, 2024), Afib, HLD, HTN, sciatica, GERD, previous cocaine use, depression, and ESRD (not on dialysis) presenting to ED with one week history of N/V, diarrhea, and abdominal discomfort. Patient was recently seen in [facility] ED 7/01 for nausea and diarrhea. Also complained of some some pain in her lower back. Denied taking any medications to help with symptoms at that time. Mild generalized abdominal tenderness on exam. No CVA tenderness. Relevant labs at the time of admission include Cr. 9.33, Calcium 8.3, Albumin 1.7, AST 12, and Hb 9.5. Suspected viral etiology and patient was given antiemetics with a plan to gain catheter access for HD on 7/03.   Overnight Events: HD yesterday, tolerated without concerns. NPO for perm cath placement 7/08.   Patient's husband Clifton Custard at bedside. Patient denies nausea, vomiting, chest pain, or palpitations. States the imodium PRN helped with loose stools. No other concerns this morning.    Objective:  Vital signs in last 24 hours: Vitals:   03/17/23 1130 03/17/23 1135 03/17/23 1140 03/17/23 1145  BP: (!) 145/75 137/78 131/73 129/74  Pulse:  72 72 72  Resp: 15 13 12 16   Temp:      TempSrc:      SpO2: 100% 100% 100% 100%  Weight:      Height:       Supplemental O2: Room Air SpO2: 100 % O2 Flow Rate (L/min): 2 L/min Filed Weights   03/14/23 1353 03/16/23 0236 03/16/23 0520  Weight: 66.1 kg 67.5 kg 66.7 kg  Telemetry with normal sinus rhythm  Physical Exam:  General: Well-appearing woman resting comfortably in bed in no apparent distress. CV: Regular rate and rhythm, trace edema bilateral lower extremities Pulmonary/Chest: Normal work of breathing on room air, lungs clear to auscultation bilaterally.  No wheezing Abdominal: Normal BS, soft, non-tender, non-distended.  Neurological: Alert & oriented x 3, moving all extremities spontaneously Skin: Warm and  dry Psych: Pleasant mood and affect   Intake/Output Summary (Last 24 hours) at 03/17/2023 1240 Last data filed at 03/17/2023 1207 Gross per 24 hour  Intake 220 ml  Output --  Net 220 ml   Net IO Since Admission: -171.31 mL [03/17/23 1240]  Pertinent Labs:    Latest Ref Rng & Units 03/17/2023    5:42 AM 03/16/2023    6:39 AM 03/15/2023    4:16 AM  CBC  WBC 4.0 - 10.5 K/uL 11.2  11.8  8.3   Hemoglobin 12.0 - 15.0 g/dL 7.0  7.7  7.3   Hematocrit 36.0 - 46.0 % 21.8  23.2  22.0   Platelets 150 - 400 K/uL 223  231  209        Latest Ref Rng & Units 03/17/2023    5:42 AM 03/16/2023    8:33 PM 03/16/2023    6:39 AM  CMP  Glucose 70 - 99 mg/dL 161  096  77   BUN 6 - 20 mg/dL 39  35  30   Creatinine 0.44 - 1.00 mg/dL 0.45  4.09  8.11   Sodium 135 - 145 mmol/L 137  136  134   Potassium 3.5 - 5.1 mmol/L 3.8  4.0  3.3   Chloride 98 - 111 mmol/L 105  102  104   CO2 22 - 32 mmol/L 24  24  23    Calcium 8.9 - 10.3 mg/dL 7.2  7.4  7.2    Renal Functional Panel  Component Ref Range & Units 05:42 (03/17/23) 1 d ago (03/16/23) 1 d ago (03/16/23) 2 d ago (03/15/23) 3 d ago (03/14/23) 4 d ago (03/13/23) 4 d ago (03/13/23)  Sodium 135 - 145 mmol/L 137 136 134 Low  137 141  142  Potassium 3.5 - 5.1 mmol/L 3.8 4.0 3.3 Low  3.4 Low  3.9  4.3  Chloride 98 - 111 mmol/L 105 102 104 106 115 High   113 High   CO2 22 - 32 mmol/L 24 24 23 24 14  Low   16 Low   Glucose, Bld 70 - 99 mg/dL 562 High  130 High  CM 77 CM 116 High  CM 56 Low  CM  69 Low  CM  Comment: Glucose reference range applies only to samples taken after fasting for at least 8 hours.  BUN 6 - 20 mg/dL 39 High  35 High  30 High  49 High  68 High   68 High   Creatinine, Ser 0.44 - 1.00 mg/dL 8.65 High  7.84 High  6.96 High  7.31 High  10.17 High   10.22 High   Calcium 8.9 - 10.3 mg/dL 7.2 Low  7.4 Low  7.2 Low  7.0 Low  7.2 Low   7.7 Low   Phosphorus 2.5 - 4.6 mg/dL 4.4 3.9 2.6 5.0 High  8.7 High  8.3 High  CM   Albumin 3.5 - 5.0 g/dL <2.9 Low   <5.2 Low  <8.4 Low  <1.5 Low  <1.5 Low   1.6 Low   GFR, Estimated >60 mL/min 9 Low  9 Low  CM 12 Low  CM 6 Low  CM 4 Low  CM  4 Low  CM    Imaging: No results found.  Assessment/Plan:   Principal Problem:   ESRD on dialysis [facility]) Active Problems:   Essential hypertension   Atrial fibrillation (HCC)   Anemia   Colitis  ESRD secondary to FSGS Microcytic anemia Patient received second dialysis session through [facility] on 03/15/2023.  Renal function stable.  Vascular surgery saw her yesterday with plans to undergo permanent dialysis access today 7/8.  Eliquis has been held by Dr. Henry Russel. Outpatient hemodialysis will need [facility] done [facility].  Patient is euvolemic today; suspect will need an additional hemodialysis session in while inpatient prior to discharge. Hgb today 7.0. Will re-check CBC after perm cath placement. Last HD session 7/07, well tolerated.  -Nephrology following, appreciate recommendations and guidance -Continue monitoring RFP -Permanent vascular access scheduled for 7/8 -Resume renal diet, 1500 ccs restriction s/p perm cath placement -F/u CBC afternoon 7/08 (Hgb) -Monitor CBC and transfuse with hemoglobin goal above 7   Atrial fibrillation Rate controlled.  Held apixaban 2.5 mg twice daily in preparation for vascular surgery tomorrow 7/8 -Continue carvedilol 6.25 mg twice daily -Consult vascular surgery for timeline to resume apixaban 2.5 mg BID  Chronic hypertension Mildly elevated today, though asymptomatic.  Will continue therapy with carvedilol and amlodipine with as needed hydralazine if persistently elevated.  Loose stools Intermittent without clear pattern.  Patient suspects this is in the setting of new renal medications/binders.  Will continue monitoring at this time for signs of hypovolemia or electrolyte disturbances -CTM -Imodium as needed, can go up on dose if loose stools worsen   Diet: Renal VTE: SCDs Code: Full  Dispo:  Anticipated discharge to Home in 3 days pending surgery and hemodialysis.   Elhadj Girton Colbert Coyer, MD Internal Medicine Resident PGY-1  Please contact the on call pager after 5 pm and on weekends at [phone].

## 2023-03-17 NOTE — TOC Progression Note (Signed)
Transition of Care [facility]) - Progression Note    Patient Details  Name: Stacey Lucas MRN: [id number] Date of Birth: 1976-02-09  Transition of Care [facility]) CM/SW Contact  Milanya Sunderland A [place], [place] Phone Number: 03/17/2023, 10:28 AM  Clinical Narrative:     CSW met with pt and pt's fiance, at bedside to discuss Medicaid transportation for HD.    CSW was given contact information to reach out to the [facility] transportation, [place], [phone] and [phone]. CSW will reach out to assist scheduling transportation once outpatient dialysis clinic has been determined.  TOC will continue to follow.     Expected Discharge Plan and Services                                               Social Determinants of Health (SDOH) Interventions SDOH Screenings   Food Insecurity: No Food Insecurity (03/15/2023)  Housing: Low Risk  (03/14/2023)  Transportation Needs: Unmet Transportation Needs (03/16/2023)  Utilities: At Risk (03/16/2023)  Alcohol Screen: Low Risk  (04/25/2018)  Depression (PHQ2-9): Low Risk  (02/04/2023)  Recent Concern: Depression (PHQ2-9) - Medium Risk (12/03/2022)  Tobacco Use: High Risk (03/16/2023)    Readmission Risk Interventions     No data to display

## 2023-03-17 NOTE — Progress Notes (Signed)
Patient ID: Stacey Lucas, female   DOB: [birth year], 47 y.o.   MRN: [id number] S: no issues this AM, for AV access today and HD tomorrow.  Husband bedside  O:BP 129/74   Pulse 72   Temp 98.7 F (37.1 C) (Oral)   Resp 16   Ht 5\' 5"  (1.651 m)   Wt 66.7 kg   LMP 10/11/2019   SpO2 100%   BMI 24.47 kg/m   Intake/Output Summary (Last 24 hours) at 03/17/2023 1147 Last data filed at 03/16/2023 1637 Gross per 24 hour  Intake 120 ml  Output --  Net 120 ml    Intake/Output: I/O last 3 completed shifts: In: 320 [P.O.:320] Out: 1000 [Other:1000]  Intake/Output this shift:  No intake/output data recorded. Weight change:  Gen: NAD CVS: RRR Resp:CTA Abd: +BS, soft, NT/nD Ext: no edema  Recent Labs  Lab 03/10/23 2041 03/13/23 1455 03/13/23 1701 03/14/23 0441 03/15/23 0416 03/16/23 0639 03/16/23 2033 03/17/23 0542  NA 142 142  --  141 137 134* 136 137  K 4.3 4.3  --  3.9 3.4* 3.3* 4.0 3.8  CL 109 113*  --  115* 106 104 102 105  CO2 16* 16*  --  14* 24 23 24 24   GLUCOSE 138* 69*  --  56* 116* 77 149* 111*  BUN 67* 68*  --  68* 49* 30* 35* 39*  CREATININE 9.33* 10.22*  --  10.17* 7.31* 4.28* 5.32* 5.74*  ALBUMIN 1.7* 1.6*  --  <1.5* <1.5* <1.5* <1.5* <1.5*  CALCIUM 8.3* 7.7*  --  7.2* 7.0* 7.2* 7.4* 7.2*  PHOS  --   --  8.3* 8.7* 5.0* 2.6 3.9 4.4  AST 12* 13*  --   --   --   --   --   --   ALT 9 6  --   --   --   --   --   --     Liver Function Tests: Recent Labs  Lab 03/10/23 2041 03/13/23 1455 03/14/23 0441 03/16/23 0639 03/16/23 2033 03/17/23 0542  AST 12* 13*  --   --   --   --   ALT 9 6  --   --   --   --   ALKPHOS 57 54  --   --   --   --   BILITOT 0.4 0.8  --   --   --   --   PROT 5.3* 5.2*  --   --   --   --   ALBUMIN 1.7* 1.6*   < > <1.5* <1.5* <1.5*   < > = values in this interval not displayed.    Recent Labs  Lab 03/10/23 2041 03/13/23 1455  LIPASE 24 20    No results for input(s): "AMMONIA" in the last 168 hours. CBC: Recent Labs  Lab  03/13/23 1455 03/14/23 0441 03/15/23 0416 03/16/23 0639 03/17/23 0542  WBC 9.0 7.7 8.3 11.8* 11.2*  HGB 9.3* 7.2* 7.3* 7.7* 7.0*  HCT 29.4* 22.5* 22.0* 23.2* 21.8*  MCV 77.2* 74.5* 74.3* 74.4* 74.7*  PLT 273 227 209 231 223    Cardiac Enzymes: No results for input(s): "CKTOTAL", "CKMB", "CKMBINDEX", "TROPONINI" in the last 168 hours. CBG: Recent Labs  Lab 03/16/23 1951 03/16/23 2133 03/17/23 0259 03/17/23 0751 03/17/23 1111  GLUCAP 149* 138* 156* 98 82     Iron Studies:  No results for input(s): "IRON", "TIBC", "TRANSFERRIN", "FERRITIN" in the last 72 hours.  Studies/Results: No results found.  [  MAR Hold] (feeding supplement) PROSource Plus  30 mL Oral BID BM   [MAR Hold] amLODipine  10 mg Oral Daily   [MAR Hold] atorvastatin  80 mg Oral Daily   [MAR Hold] carvedilol  6.25 mg Oral BID WC   [MAR Hold] Chlorhexidine Gluconate Cloth  6 each Topical Q0600   [MAR Hold] darbepoetin (ARANESP) injection - DIALYSIS  60 mcg Subcutaneous Q Fri-1800   fentaNYL       [MAR Hold] sevelamer carbonate  800 mg Oral TID WC   [MAR Hold] sodium chloride flush  3 mL Intravenous Q12H    BMET    Component Value Date/Time   NA 137 03/17/2023 0542   NA 143 03/05/2022 1045   K 3.8 03/17/2023 0542   CL 105 03/17/2023 0542   CO2 24 03/17/2023 0542   GLUCOSE 111 (H) 03/17/2023 0542   BUN 39 (H) 03/17/2023 0542   BUN 21 03/05/2022 1045   CREATININE 5.74 (H) 03/17/2023 0542   CALCIUM 7.2 (L) 03/17/2023 0542   GFRNONAA 9 (L) 03/17/2023 0542   GFRAA >60 04/14/2020 1335   CBC    Component Value Date/Time   WBC 11.2 (H) 03/17/2023 0542   RBC 2.92 (L) 03/17/2023 0542   HGB 7.0 (L) 03/17/2023 0542   HGB 10.9 (L) 03/05/2022 1045   HCT 21.8 (L) 03/17/2023 0542   HCT 32.6 (L) 03/05/2022 1045   PLT 223 03/17/2023 0542   PLT 287 03/05/2022 1045   MCV 74.7 (L) 03/17/2023 0542   MCV 77 (L) 03/05/2022 1045   MCH 24.0 (L) 03/17/2023 0542   MCHC 32.1 03/17/2023 0542   RDW 17.1 (H)  03/17/2023 0542   RDW 14.0 03/05/2022 1045   LYMPHSABS 2.3 11/16/2022 0352   LYMPHSABS 3.0 03/05/2022 1045   MONOABS 0.9 11/16/2022 0352   EOSABS 0.3 11/16/2022 0352   EOSABS 0.3 03/05/2022 1045   BASOSABS 0.0 11/16/2022 0352   BASOSABS 0.0 03/05/2022 1045    Assessment/Plan:   ESRD - due to FSGS.  S/p [facility] 03/14/23 followed by her first HD session.  Plan for HD again tomorrow.  CLIP process started and due to insurance issues, she will need to go to [facility] in [facility].  Hep B core Ab is negative and awaiting TB test. HTN/volume - stable. Anemia of CKD Stage V - Hgb down to 7.0.  Started ESA and IV iron.  TSAT 22%, ferritin 84.  Transfuse for Hgb <7. Secondary hyperparathyroidism/CKD-BMD - phos 8.7 now at goal on binder and diet. PTH 56. Vascular access - as outlined above.  For AVG placement today.  Will hold HD until either later in day or on Tuesday pending CLIP schedule. Severe protein malnutrition - alb <1.5.  recommend protein supplementation.    Additional recommendations: - Dose all meds for creatinine clearance < 10 ml/min  - Unless absolutely necessary, no MRIs with gadolinium.  - Implement save arm precautions.  Prefer needle sticks in the dorsum of the hands or wrists.  No blood pressure measurements in arm. - If blood transfusion is requested during hemodialysis sessions, please alert [place] prior to the session.  - Use synthetic opioids (Fentanyl/Dilaudid) if needed  Estill Bakes MD [facility] Pager [phone]

## 2023-03-17 NOTE — Plan of Care (Signed)

## 2023-03-18 ENCOUNTER — Encounter (HOSPITAL_COMMUNITY): Payer: Self-pay | Admitting: Vascular Surgery

## 2023-03-18 DIAGNOSIS — D539 Nutritional anemia, unspecified: Secondary | ICD-10-CM | POA: Diagnosis not present

## 2023-03-18 DIAGNOSIS — I4891 Unspecified atrial fibrillation: Secondary | ICD-10-CM | POA: Diagnosis not present

## 2023-03-18 DIAGNOSIS — I120 Hypertensive chronic kidney disease with stage 5 chronic kidney disease or end stage renal disease: Secondary | ICD-10-CM | POA: Diagnosis not present

## 2023-03-18 DIAGNOSIS — N186 End stage renal disease: Secondary | ICD-10-CM | POA: Diagnosis not present

## 2023-03-18 LAB — CBC WITH DIFFERENTIAL/PLATELET
Abs Immature Granulocytes: 0.15 10*3/uL — ABNORMAL HIGH (ref 0.00–0.07)
Basophils Absolute: 0.1 10*3/uL (ref 0.0–0.1)
Basophils Relative: 1 %
Eosinophils Absolute: 0.3 10*3/uL (ref 0.0–0.5)
Eosinophils Relative: 2 %
HCT: 24 % — ABNORMAL LOW (ref 36.0–46.0)
Hemoglobin: 7.6 g/dL — ABNORMAL LOW (ref 12.0–15.0)
Immature Granulocytes: 1 %
Lymphocytes Relative: 27 %
Lymphs Abs: 3.4 10*3/uL (ref 0.7–4.0)
MCH: 24.1 pg — ABNORMAL LOW (ref 26.0–34.0)
MCHC: 31.7 g/dL (ref 30.0–36.0)
MCV: 76.2 fL — ABNORMAL LOW (ref 80.0–100.0)
Monocytes Absolute: 1 10*3/uL (ref 0.1–1.0)
Monocytes Relative: 8 %
Neutro Abs: 7.6 10*3/uL (ref 1.7–7.7)
Neutrophils Relative %: 61 %
Platelets: 238 10*3/uL (ref 150–400)
RBC: 3.15 MIL/uL — ABNORMAL LOW (ref 3.87–5.11)
RDW: 17.1 % — ABNORMAL HIGH (ref 11.5–15.5)
WBC: 12.5 10*3/uL — ABNORMAL HIGH (ref 4.0–10.5)
nRBC: 0.2 % (ref 0.0–0.2)

## 2023-03-18 LAB — RENAL FUNCTION PANEL
Albumin: <1.5 g/dL — ABNORMAL LOW (ref 3.5–5.0)
Anion gap: 7 (ref 5–15)
BUN: 49 mg/dL — ABNORMAL HIGH (ref 6–20)
CO2: 23 mmol/L (ref 22–32)
Calcium: 7.4 mg/dL — ABNORMAL LOW (ref 8.9–10.3)
Chloride: 107 mmol/L (ref 98–111)
Creatinine, Ser: 6.73 mg/dL — ABNORMAL HIGH (ref 0.44–1.00)
GFR, Estimated: 7 mL/min — ABNORMAL LOW (ref >60)
Glucose, Bld: 168 mg/dL — ABNORMAL HIGH (ref 70–99)
Phosphorus: 5 mg/dL — ABNORMAL HIGH (ref 2.5–4.6)
Potassium: 3.5 mmol/L (ref 3.5–5.1)
Sodium: 137 mmol/L (ref 135–145)

## 2023-03-18 LAB — GLUCOSE, CAPILLARY
Glucose-Capillary: 146 mg/dL — ABNORMAL HIGH (ref 70–99)
Glucose-Capillary: 99 mg/dL (ref 70–99)
Glucose-Capillary: 90 mg/dL (ref 70–99)
Glucose-Capillary: 91 mg/dL (ref 70–99)

## 2023-03-18 MED ORDER — HYDROMORPHONE HCL 1 MG/ML IJ SOLN
0.5000 mg | INTRAMUSCULAR | Status: DC | PRN
  Administered 2023-03-18 (×2): 0.5 mg via INTRAVENOUS
  Filled 2023-03-18 (×2): qty 0.5

## 2023-03-18 MED ORDER — APIXABAN 2.5 MG PO TABS
2.5000 mg | ORAL_TABLET | Freq: Two times a day (BID) | ORAL | Status: DC
  Administered 2023-03-18 – 2023-03-19 (×3): 2.5 mg via ORAL
  Filled 2023-03-18 (×2): qty 1

## 2023-03-18 MED ORDER — TRIMETHOBENZAMIDE HCL 100 MG/ML IM SOLN
200.0000 mg | Freq: Once | INTRAMUSCULAR | Status: DC
  Filled 2023-03-18 (×2): qty 2

## 2023-03-18 MED ORDER — HEPARIN SODIUM (PORCINE) 1000 UNIT/ML IJ SOLN
INTRAMUSCULAR | Status: AC
  Administered 2023-03-18: 1000 [IU]
  Filled 2023-03-18: qty 4

## 2023-03-18 MED ORDER — PROMETHAZINE HCL 12.5 MG PO TABS
12.5000 mg | ORAL_TABLET | Freq: Once | ORAL | Status: AC
  Administered 2023-03-19: 12.5 mg via ORAL
  Filled 2023-03-18: qty 1

## 2023-03-18 NOTE — Progress Notes (Signed)
   03/18/23 1312  Vitals  Temp 98.3 F (36.8 C)  BP (!) 162/64  Pulse Rate 82  Resp 20  Oxygen Therapy  O2 Device Room Air  Patient Activity (if Appropriate) In bed  Pulse Oximetry Type Continuous  Oximetry Probe Site Changed Yes  Post Treatment  Dialyzer Clearance Lightly streaked  Duration of HD Treatment -hour(s) 3.25 hour(s)  Liters Processed 48.8  Fluid Removed (mL) 1000 mL  Tolerated HD Treatment Yes  Post-Hemodialysis Comments Patient tolerated Tx. well / Nauseated and Vomitting at the end of her tx.  Hemodialysis Catheter Right Internal jugular Double lumen Permanent (Tunneled)  Placement Date/Time: 03/14/23 0941   Serial / Lot #: [id number]  Expiration Date: 09/09/27  Time Out: Correct patient;Correct site;Correct procedure  Maximum sterile barrier precautions: Hand hygiene;Cap;Mask;Sterile gown;Large sterile sheet;Sterile gl...  Site Condition No complications  Blue Lumen Status Heparin locked  Red Lumen Status Heparin locked  Purple Lumen Status N/A  Catheter fill solution Heparin 1000 units/ml  Catheter fill volume (Arterial) 1.6 cc  Catheter fill volume (Venous) 1.6  Dressing Type Transparent  Dressing Status Antimicrobial disc in place;Clean, Dry, Intact  Interventions New dressing  Drainage Description None  Post treatment catheter status Capped and Clamped

## 2023-03-18 NOTE — Progress Notes (Addendum)
Hospital day#4 Subjective:  Summary: Stacey Lucas is a 32 female with PMH of ischemic infarct (2023, 2024), Afib, HLD, HTN, sciatica, GERD, previous cocaine use, depression, and ESRD (not on dialysis) presenting to ED with one week history of N/V, diarrhea, and abdominal discomfort admitted for ESRD dialysis management. Perm cath placement 7/08 (L arm cephalic vein, brachial artery graft) with no complications.   No overnight events. Patient seen while receiving HD 7/09. Patient denies nausea, vomiting, chest pain, SOB, or palpitations. Loose stools improved from yesterday. Only complaint this morning is pain around L arm fistula site, which is intermittent.   Objective:  Vital signs in last 24 hours: Vitals:   03/18/23 1226 03/18/23 1257 03/18/23 1312 03/18/23 1401  BP: 136/77 (!) 159/75 (!) 162/64 (!) 150/73  Pulse: 80 77 82 80  Resp: 10 (!) 9 20 16   Temp:  98.3 F (36.8 C) 98.3 F (36.8 C) 98.1 F (36.7 C)  TempSrc:    Oral  SpO2: 100% 100%  100%  Weight:   65.5 kg   Height:       Supplemental O2: Room Air SpO2: 100 % O2 Flow Rate (L/min): 2 L/min Filed Weights   03/18/23 0000 03/18/23 0940 03/18/23 1312  Weight: 67.3 kg 66.3 kg 65.5 kg  Telemetry with normal sinus rhythm  Physical Exam:  General: Well-appearing woman resting comfortably in bed in no apparent distress. CV: Regular rate and rhythm, no rubs, murmurs, or gallops.  Pulmonary/Chest: Normal work of breathing on room air, lungs clear to auscultation bilaterally. No wheezing Abdominal: Normal BS, soft, non-tender, non-distended.  Neurological: Alert & oriented, moving all extremities spontaneously. Skin: Warm and dry.  Fistula site appears clean and dry, no bleeding Psych: Pleasant mood and affect.   Intake/Output Summary (Last 24 hours) at 03/18/2023 1410 Last data filed at 03/18/2023 1312 Gross per 24 hour  Intake 200 ml  Output 1000 ml  Net -800 ml   Net IO Since Admission: -776.31 mL [03/18/23  1410]  Pertinent Labs:    Latest Ref Rng & Units 03/18/2023    3:55 AM 03/17/2023    6:23 PM 03/17/2023    5:42 AM  CBC  WBC 4.0 - 10.5 K/uL 12.5  13.2  11.2   Hemoglobin 12.0 - 15.0 g/dL 7.6  7.5  7.0   Hematocrit 36.0 - 46.0 % 24.0  23.4  21.8   Platelets 150 - 400 K/uL 238  225  223        Latest Ref Rng & Units 03/18/2023    3:55 AM 03/17/2023    5:42 AM 03/16/2023    8:33 PM  CMP  Glucose 70 - 99 mg/dL 161  096  045   BUN 6 - 20 mg/dL 49  39  35   Creatinine 0.44 - 1.00 mg/dL 4.09  8.11  9.14   Sodium 135 - 145 mmol/L 137  137  136   Potassium 3.5 - 5.1 mmol/L 3.5  3.8  4.0   Chloride 98 - 111 mmol/L 107  105  102   CO2 22 - 32 mmol/L 23  24  24    Calcium 8.9 - 10.3 mg/dL 7.4  7.2  7.4    Renal Functional Panel:          Component Ref Range & Units 03:55 (03/18/23) 1 d ago (03/17/23) 2 d ago (03/16/23) 2 d ago (03/16/23) 3 d ago (03/15/23) 4 d ago (03/14/23) 5 d ago (03/13/23)  Sodium 135 - 145  mmol/L 137 137 136 134 Low  137 141   Potassium 3.5 - 5.1 mmol/L 3.5 3.8 4.0 3.3 Low  3.4 Low  3.9   Chloride 98 - 111 mmol/L 107 105 102 104 106 115 High    CO2 22 - 32 mmol/L 23 24 24 23 24 14  Low    Glucose, Bld 70 - 99 mg/dL 621 High  308 High  CM 149 High  CM 77 CM 116 High  CM 56 Low  CM   Comment: Glucose reference range applies only to samples taken after fasting for at least 8 hours.  BUN 6 - 20 mg/dL 49 High  39 High  35 High  30 High  49 High  68 High    Creatinine, Ser 0.44 - 1.00 mg/dL 6.57 High  8.46 High  9.62 High  4.28 High  7.31 High  10.17 High    Calcium 8.9 - 10.3 mg/dL 7.4 Low  7.2 Low  7.4 Low  7.2 Low  7.0 Low  7.2 Low    Phosphorus 2.5 - 4.6 mg/dL 5.0 High  4.4 3.9 2.6 5.0 High  8.7 High  8.3 High  CM  Albumin 3.5 - 5.0 g/dL <9.5 Low  <2.8 Low  <4.1 Low  <1.5 Low  <1.5 Low  <1.5 Low    GFR, Estimated >60 mL/min 7 Low  9 Low  CM 9 Low  CM 12 Low  CM 6 Low  CM 4 Low  CM    Imaging: No results found.  Assessment/Plan:   Principal Problem:   ESRD on  dialysis [facility]) Active Problems:   Essential hypertension   Atrial fibrillation (HCC)   Anemia   Colitis  ESRD secondary to FSGS Microcytic anemia Patient received second dialysis session through [facility] on 03/15/2023.  Renal function stable.  Vascular surgery saw her yesterday with plans to undergo permanent dialysis access today 7/8.  Eliquis has been held by Dr. Henry Russel (vascular surgery). Patient is euvolemic today; Hgb 7.6 s/p perm cath placement. Last HD session 7/09, well tolerated. Per social work, patient's referral for dialysis is still pending, renal navigator will provide updates. Plan for outpatient hemodialysis in [facility]. Per nephrology, patient is ready for discharge once outpatient dialysis schedule is confirmed. Pain around L arm fistula; nephrology recommendations for pain is fentanyl or dilaudid.  Plan: -Nephrology following, continue to appreciate recommendations and guidance -Continue monitoring RFP (Cr, phosphorus) -Renal diet, 1500 ccs restriction s/p perm cath placement -IV dilaudid q4h for severe pain -Monitor CBC and transfuse with hemoglobin goal > 7  Atrial fibrillation Rate controlled.   -Resume apixaban 2.5 mg and telemetry  -Continue carvedilol 6.25 mg twice daily  Chronic hypertension Mildly elevated today, though asymptomatic.  Will continue therapy with carvedilol and amlodipine with as needed hydralazine if persistently elevated.  Loose stools Intermittent without clear pattern, improved today.  Patient suspects this is in the setting of new renal medications/binders.  Will continue monitoring at this time for signs of hypovolemia or electrolyte disturbances -CTM -Imodium as needed, can go up on dose if loose stools worsen   Diet: Renal VTE: SCDs Code: Full  Dispo: Anticipated discharge to Home in 2 days pending hemodialysis management.   Ashden Sonnenberg Colbert Coyer, MD Internal Medicine Resident PGY-1 Please contact the on call pager after 5 pm  and on weekends at [phone].

## 2023-03-18 NOTE — Progress Notes (Signed)
  Postoperative hemodialysis access     Date of Surgery:  03/17/2023 Surgeon: Karin Lieu  Subjective:  denies pain in her left hand.    PHYSICAL EXAMINATION:  Vitals:   03/17/23 1426 03/18/23 0231  BP: 120/70 132/63  Pulse: 68 92  Resp: 18   Temp: 98.3 F (36.8 C) 98 F (36.7 C)  SpO2: 100% 100%    Incision is clean and dry Sensation in digits is intact;  There is  Thrill  The fistula is palpable  The radial pulse is palpable   ASSESSMENT/PLAN:  Stacey Lucas is a 47 y.o. year old female who is s/p left BC AVF 03/17/2023 by Dr. Karin Lieu.  - fistula is patent -pt does not have evidence of steal sx -f/u with [facility] in 6 weeks with dialysis duplex to check maturation of AVF.  Our office will arrange appt.   -discussed that access does not last forever and will eventually need intervention or even new access.  She expressed understanding in that she has a family member who has been on dialysis.  -will sign off-call as needed.   Doreatha Massed, PA-C [facility] [phone]  VASCULAR STAFF ADDENDUM: I have independently interviewed and examined the patient. I agree with the above.  Excellent thrill. No hematoma  Fara Olden, MD [facility] Phone Number: [phone] 03/19/2023 8:00 AM

## 2023-03-18 NOTE — Progress Notes (Signed)
Patient ID: Stacey Lucas, female   DOB: 02-24-1976, 47 y.o.   MRN: [id number] S: no issues this AM, s/p  AV access yesterday no steal symptoms. For HD today.  Husband bedside  O:BP 132/63   Pulse 92   Temp 98 F (36.7 C) (Oral)   Resp 18   Ht 5\' 5"  (1.651 m)   Wt 67.3 kg   LMP 10/11/2019   SpO2 100%   BMI 24.69 kg/m   Intake/Output Summary (Last 24 hours) at 03/18/2023 0727 Last data filed at 03/17/2023 1615 Gross per 24 hour  Intake 500 ml  Output 5 ml  Net 495 ml    Intake/Output: I/O last 3 completed shifts: In: 500 [P.O.:200; I.V.:200; IV Piggyback:100] Out: 5 [Blood:5]  Intake/Output this shift:  No intake/output data recorded. Weight change:  Gen: NAD CVS: RRR Resp:CTA Abd: +BS, soft, NT/nD Ext: no edema RIJ TDC c/d/i  Recent Labs  Lab 03/13/23 1455 03/13/23 1701 03/14/23 0441 03/15/23 0416 03/16/23 0639 03/16/23 2033 03/17/23 0542 03/18/23 0355  NA 142  --  141 137 134* 136 137 137  K 4.3  --  3.9 3.4* 3.3* 4.0 3.8 3.5  CL 113*  --  115* 106 104 102 105 107  CO2 16*  --  14* 24 23 24 24 23   GLUCOSE 69*  --  56* 116* 77 149* 111* 168*  BUN 68*  --  68* 49* 30* 35* 39* 49*  CREATININE 10.22*  --  10.17* 7.31* 4.28* 5.32* 5.74* 6.73*  ALBUMIN 1.6*  --  <1.5* <1.5* <1.5* <1.5* <1.5* <1.5*  CALCIUM 7.7*  --  7.2* 7.0* 7.2* 7.4* 7.2* 7.4*  PHOS  --  8.3* 8.7* 5.0* 2.6 3.9 4.4 5.0*  AST 13*  --   --   --   --   --   --   --   ALT 6  --   --   --   --   --   --   --     Liver Function Tests: Recent Labs  Lab 03/13/23 1455 03/14/23 0441 03/16/23 2033 03/17/23 0542 03/18/23 0355  AST 13*  --   --   --   --   ALT 6  --   --   --   --   ALKPHOS 54  --   --   --   --   BILITOT 0.8  --   --   --   --   PROT 5.2*  --   --   --   --   ALBUMIN 1.6*   < > <1.5* <1.5* <1.5*   < > = values in this interval not displayed.    Recent Labs  Lab 03/13/23 1455  LIPASE 20    No results for input(s): "AMMONIA" in the last 168 hours. CBC: Recent Labs  Lab  03/15/23 0416 03/16/23 0639 03/17/23 0542 03/17/23 1823 03/18/23 0355  WBC 8.3 11.8* 11.2* 13.2* 12.5*  NEUTROABS  --   --   --   --  7.6  HGB 7.3* 7.7* 7.0* 7.5* 7.6*  HCT 22.0* 23.2* 21.8* 23.4* 24.0*  MCV 74.3* 74.4* 74.7* 76.2* 76.2*  PLT 209 231 223 225 238    Cardiac Enzymes: No results for input(s): "CKTOTAL", "CKMB", "CKMBINDEX", "TROPONINI" in the last 168 hours. CBG: Recent Labs  Lab 03/17/23 0751 03/17/23 1111 03/17/23 1328 03/17/23 1552 03/17/23 2054  GLUCAP 98 82 84 75 155*     Iron Studies:  No results  for input(s): "IRON", "TIBC", "TRANSFERRIN", "FERRITIN" in the last 72 hours.  Studies/Results: No results found.  (feeding supplement) PROSource Plus  30 mL Oral BID BM   amLODipine  10 mg Oral Daily   atorvastatin  80 mg Oral Daily   carvedilol  6.25 mg Oral BID WC   Chlorhexidine Gluconate Cloth  6 each Topical Q0600   darbepoetin (ARANESP) injection - DIALYSIS  60 mcg Subcutaneous Q Fri-1800   sevelamer carbonate  800 mg Oral TID WC   sodium chloride flush  3 mL Intravenous Q12H    BMET    Component Value Date/Time   NA 137 03/18/2023 0355   NA 143 03/05/2022 1045   K 3.5 03/18/2023 0355   CL 107 03/18/2023 0355   CO2 23 03/18/2023 0355   GLUCOSE 168 (H) 03/18/2023 0355   BUN 49 (H) 03/18/2023 0355   BUN 21 03/05/2022 1045   CREATININE 6.73 (H) 03/18/2023 0355   CALCIUM 7.4 (L) 03/18/2023 0355   GFRNONAA 7 (L) 03/18/2023 0355   GFRAA >60 04/14/2020 1335   CBC    Component Value Date/Time   WBC 12.5 (H) 03/18/2023 0355   RBC 3.15 (L) 03/18/2023 0355   HGB 7.6 (L) 03/18/2023 0355   HGB 10.9 (L) 03/05/2022 1045   HCT 24.0 (L) 03/18/2023 0355   HCT 32.6 (L) 03/05/2022 1045   PLT 238 03/18/2023 0355   PLT 287 03/05/2022 1045   MCV 76.2 (L) 03/18/2023 0355   MCV 77 (L) 03/05/2022 1045   MCH 24.1 (L) 03/18/2023 0355   MCHC 31.7 03/18/2023 0355   RDW 17.1 (H) 03/18/2023 0355   RDW 14.0 03/05/2022 1045   LYMPHSABS 3.4 03/18/2023 0355    LYMPHSABS 3.0 03/05/2022 1045   MONOABS 1.0 03/18/2023 0355   EOSABS 0.3 03/18/2023 0355   EOSABS 0.3 03/05/2022 1045   BASOSABS 0.1 03/18/2023 0355   BASOSABS 0.0 03/05/2022 1045    Assessment/Plan:   ESRD - due to FSGS.  S/p RIJ [facility] 03/14/23 followed by her first HD session.  Plan for HD again today.  CLIP process started and due to insurance issues, she will need to go to [facility] in [facility].  Hep B core Ab is negative.  Ok for discharge as soon as dialysis schedule confirmed. HTN/volume - stable. Anemia of CKD Stage V - Hgb in 7s without overt bleeding.  Started ESA and IV iron.  TSAT 22%, ferritin 84.  Transfuse for Hgb <7. Secondary hyperparathyroidism/CKD-BMD - phos 8.7 now at goal on binder and diet. PTH 56. Vascular access - s/p AVF 7/8, will see VVS in office in 6 weeks.  Severe protein malnutrition - alb <1.5.  recommend protein supplementation.    Additional recommendations: - Dose all meds for creatinine clearance < 10 ml/min  - Unless absolutely necessary, no MRIs with gadolinium.  - Implement save arm precautions.  Prefer needle sticks in the dorsum of the hands or wrists.  No blood pressure measurements in arm. - If blood transfusion is requested during hemodialysis sessions, please alert [place] prior to the session.  - Use synthetic opioids (Fentanyl/Dilaudid) if needed  Estill Bakes MD [facility] Pager [phone]

## 2023-03-18 NOTE — Progress Notes (Addendum)
Spoke to [place] with [facility] out-pt HD. Pt's referral is currently pending. Beverly to f/u with navigator later today with possible update. Will assist as needed.   Olivia Canter Renal Navigator [phone]   Addendum at 4:39 pm: Pt has been accepted at [facility] in [facility] on MWF 11:20 chair time. Pt can start on Friday and will need to arrive at 11:00 am. Update provided to attending, nephrologist, RN CM, and CSW. Will f/u with pt tomorrow morning regarding arrangements.

## 2023-03-19 ENCOUNTER — Other Ambulatory Visit (HOSPITAL_COMMUNITY): Payer: Self-pay

## 2023-03-19 ENCOUNTER — Ambulatory Visit: Payer: 59 | Admitting: Physical Therapy

## 2023-03-19 DIAGNOSIS — I4891 Unspecified atrial fibrillation: Secondary | ICD-10-CM | POA: Diagnosis not present

## 2023-03-19 DIAGNOSIS — N186 End stage renal disease: Secondary | ICD-10-CM | POA: Diagnosis not present

## 2023-03-19 DIAGNOSIS — D539 Nutritional anemia, unspecified: Secondary | ICD-10-CM | POA: Diagnosis not present

## 2023-03-19 DIAGNOSIS — I120 Hypertensive chronic kidney disease with stage 5 chronic kidney disease or end stage renal disease: Secondary | ICD-10-CM | POA: Diagnosis not present

## 2023-03-19 LAB — RENAL FUNCTION PANEL
Albumin: <1.5 g/dL — ABNORMAL LOW (ref 3.5–5.0)
Anion gap: 6 (ref 5–15)
BUN: 26 mg/dL — ABNORMAL HIGH (ref 6–20)
CO2: 26 mmol/L (ref 22–32)
Calcium: 7.5 mg/dL — ABNORMAL LOW (ref 8.9–10.3)
Chloride: 103 mmol/L (ref 98–111)
Creatinine, Ser: 4.48 mg/dL — ABNORMAL HIGH (ref 0.44–1.00)
GFR, Estimated: 12 mL/min — ABNORMAL LOW (ref >60)
Glucose, Bld: 90 mg/dL (ref 70–99)
Phosphorus: 3.4 mg/dL (ref 2.5–4.6)
Potassium: 3.4 mmol/L — ABNORMAL LOW (ref 3.5–5.1)
Sodium: 135 mmol/L (ref 135–145)

## 2023-03-19 LAB — CBC
HCT: 22.5 % — ABNORMAL LOW (ref 36.0–46.0)
Hemoglobin: 7.2 g/dL — ABNORMAL LOW (ref 12.0–15.0)
MCH: 24.2 pg — ABNORMAL LOW (ref 26.0–34.0)
MCHC: 32 g/dL (ref 30.0–36.0)
MCV: 75.8 fL — ABNORMAL LOW (ref 80.0–100.0)
Platelets: 226 10*3/uL (ref 150–400)
RBC: 2.97 MIL/uL — ABNORMAL LOW (ref 3.87–5.11)
RDW: 16.8 % — ABNORMAL HIGH (ref 11.5–15.5)
WBC: 11.3 10*3/uL — ABNORMAL HIGH (ref 4.0–10.5)
nRBC: 0.2 % (ref 0.0–0.2)

## 2023-03-19 LAB — GLUCOSE, CAPILLARY: Glucose-Capillary: 134 mg/dL — ABNORMAL HIGH (ref 70–99)

## 2023-03-19 MED ORDER — ACETAMINOPHEN 500 MG PO TABS
1000.0000 mg | ORAL_TABLET | Freq: Three times a day (TID) | ORAL | 0 refills | Status: AC | PRN
  Filled 2023-03-19: qty 100, 17d supply, fill #0

## 2023-03-19 MED ORDER — SEVELAMER CARBONATE 800 MG PO TABS
800.0000 mg | ORAL_TABLET | Freq: Three times a day (TID) | ORAL | 2 refills | Status: AC
  Filled 2023-03-19 – 2023-05-19 (×2): qty 90, 30d supply, fill #0

## 2023-03-19 MED ORDER — POTASSIUM CHLORIDE CRYS ER 20 MEQ PO TBCR
40.0000 meq | EXTENDED_RELEASE_TABLET | Freq: Once | ORAL | Status: AC
  Administered 2023-03-19: 40 meq via ORAL
  Filled 2023-03-19: qty 2

## 2023-03-19 NOTE — Progress Notes (Signed)
03/19/2023  Stacey Lucas DOB: 07-Jun-1976 MRN: [id number]   RIDER WAIVER AND RELEASE OF LIABILITY  For the purposes of helping with transportation needs, [facility] partners with outside transportation providers (taxi companies, [place], Catering manager.) to give [employer] patients or other approved people the choice of on-demand rides [employer]") to our buildings for non-emergency visits.  By using [employer], I, the person signing this document, on behalf of myself and/or any legal minors (in my care using the [employer]), agree:  Science writer given to me are supplied by independent, outside transportation providers who do not work for, or have any affiliation with, [employer]. [place] is not a transportation company. [place] has no control over the quality or safety of the rides I get using [employer]. [place] has no control over whether any outside ride will happen on time or not. [facility] gives no guarantee on the reliability, quality, safety, or availability on any rides, or that no mistakes will happen. I know and accept that traveling by vehicle (car, truck, SVU, Zenaida Niece, bus, taxi, etc.) has risks of serious injuries such as disability, being paralyzed, and death. I know and agree the risk of using [employer] is mine alone, and not [employer]. Transport Services are provided "as is" and as are available. The transportation providers are in charge for all inspections and care of the vehicles used to provide these rides. I agree not to take legal action against [place], its agents, employees, officers, directors, representatives, insurers, attorneys, assigns, successors, subsidiaries, and affiliates at any time for any reasons related directly or indirectly to using [employer]. I also agree not to take legal action against [place] or its affiliates for any injury, death, or damage to property caused by or related to using  [employer]. I have read this Waiver and Release of Liability, and I understand the terms used in it and their legal meaning. This Waiver is freely and voluntarily given with the understanding that my right (or any legal minors) to legal action against [place] relating to [employer] is knowingly given up to use these services.   I attest that I read the Ride Waiver and Release of Liability to Stacey Lucas, gave [name] the opportunity to ask questions and answered the questions asked (if any). I affirm that Stacey Lucas then provided consent for assistance with transportation.

## 2023-03-19 NOTE — Progress Notes (Signed)
Patient ID: Stacey Lucas, female   DOB: March 07, 1976, 47 y.o.   MRN: [id number] S: no issues this AM, s/p  AV access 2d ago and  no steal symptoms. Tolerated HD fine yesterday.  Husband not present today.  For d/c.   O:BP (!) 115/94 (BP Location: Right Arm)   Pulse 84   Temp 97.9 F (36.6 C)   Resp 19   Ht 5\' 5"  (1.651 m)   Wt 67.6 kg   LMP 10/11/2019   SpO2 100%   BMI 24.80 kg/m   Gen: NAD CVS: RRR Resp:CTA Abd: +BS, soft, NT/nD Ext: no edema RIJ TDC c/d/i  Recent Labs  Lab 03/13/23 1455 03/13/23 1701 03/14/23 0441 03/15/23 0416 03/16/23 0639 03/16/23 2033 03/17/23 0542 03/18/23 0355 03/19/23 0333  NA 142  --  141 137 134* 136 137 137 135  K 4.3  --  3.9 3.4* 3.3* 4.0 3.8 3.5 3.4*  CL 113*  --  115* 106 104 102 105 107 103  CO2 16*  --  14* 24 23 24 24 23 26   GLUCOSE 69*  --  56* 116* 77 149* 111* 168* 90  BUN 68*  --  68* 49* 30* 35* 39* 49* 26*  CREATININE 10.22*  --  10.17* 7.31* 4.28* 5.32* 5.74* 6.73* 4.48*  ALBUMIN 1.6*  --  <1.5* <1.5* <1.5* <1.5* <1.5* <1.5* <1.5*  CALCIUM 7.7*  --  7.2* 7.0* 7.2* 7.4* 7.2* 7.4* 7.5*  PHOS  --    < > 8.7* 5.0* 2.6 3.9 4.4 5.0* 3.4  AST 13*  --   --   --   --   --   --   --   --   ALT 6  --   --   --   --   --   --   --   --    < > = values in this interval not displayed.    Liver Function Tests: Recent Labs  Lab 03/13/23 1455 03/14/23 0441 03/17/23 0542 03/18/23 0355 03/19/23 0333  AST 13*  --   --   --   --   ALT 6  --   --   --   --   ALKPHOS 54  --   --   --   --   BILITOT 0.8  --   --   --   --   PROT 5.2*  --   --   --   --   ALBUMIN 1.6*   < > <1.5* <1.5* <1.5*   < > = values in this interval not displayed.    Recent Labs  Lab 03/13/23 1455  LIPASE 20    No results for input(s): "AMMONIA" in the last 168 hours. CBC: Recent Labs  Lab 03/16/23 0639 03/17/23 0542 03/17/23 1823 03/18/23 0355 03/19/23 0333  WBC 11.8* 11.2* 13.2* 12.5* 11.3*  NEUTROABS  --   --   --  7.6  --   HGB 7.7* 7.0* 7.5*  7.6* 7.2*  HCT 23.2* 21.8* 23.4* 24.0* 22.5*  MCV 74.4* 74.7* 76.2* 76.2* 75.8*  PLT 231 223 225 238 226    Cardiac Enzymes: No results for input(s): "CKTOTAL", "CKMB", "CKMBINDEX", "TROPONINI" in the last 168 hours. CBG: Recent Labs  Lab 03/18/23 0731 03/18/23 1359 03/18/23 1715 03/18/23 2032 03/19/23 0847  GLUCAP 146* 99 90 91 134*     Iron Studies:  No results for input(s): "IRON", "TIBC", "TRANSFERRIN", "FERRITIN" in the last 72 hours.  Studies/Results: No  results found.  (feeding supplement) PROSource Plus  30 mL Oral BID BM   amLODipine  10 mg Oral Daily   apixaban  2.5 mg Oral BID   atorvastatin  80 mg Oral Daily   carvedilol  6.25 mg Oral BID WC   Chlorhexidine Gluconate Cloth  6 each Topical Q0600   darbepoetin (ARANESP) injection - DIALYSIS  60 mcg Subcutaneous Q Fri-1800   sevelamer carbonate  800 mg Oral TID WC   sodium chloride flush  3 mL Intravenous Q12H    BMET    Component Value Date/Time   NA 135 03/19/2023 0333   NA 143 03/05/2022 1045   K 3.4 (L) 03/19/2023 0333   CL 103 03/19/2023 0333   CO2 26 03/19/2023 0333   GLUCOSE 90 03/19/2023 0333   BUN 26 (H) 03/19/2023 0333   BUN 21 03/05/2022 1045   CREATININE 4.48 (H) 03/19/2023 0333   CALCIUM 7.5 (L) 03/19/2023 0333   GFRNONAA 12 (L) 03/19/2023 0333   GFRAA >60 04/14/2020 1335   CBC    Component Value Date/Time   WBC 11.3 (H) 03/19/2023 0333   RBC 2.97 (L) 03/19/2023 0333   HGB 7.2 (L) 03/19/2023 0333   HGB 10.9 (L) 03/05/2022 1045   HCT 22.5 (L) 03/19/2023 0333   HCT 32.6 (L) 03/05/2022 1045   PLT 226 03/19/2023 0333   PLT 287 03/05/2022 1045   MCV 75.8 (L) 03/19/2023 0333   MCV 77 (L) 03/05/2022 1045   MCH 24.2 (L) 03/19/2023 0333   MCHC 32.0 03/19/2023 0333   RDW 16.8 (H) 03/19/2023 0333   RDW 14.0 03/05/2022 1045   LYMPHSABS 3.4 03/18/2023 0355   LYMPHSABS 3.0 03/05/2022 1045   MONOABS 1.0 03/18/2023 0355   EOSABS 0.3 03/18/2023 0355   EOSABS 0.3 03/05/2022 1045   BASOSABS  0.1 03/18/2023 0355   BASOSABS 0.0 03/05/2022 1045    Assessment/Plan:   ESRD - due to FSGS.  S/p RIJ [facility] 03/14/23 followed by her first HD session.  Plan for HD again today.  CLIP process started and due to insurance issues, she will need to go to [facility] in [facility].  Hep B core Ab is negative.  Ok for discharge today and 1st outpt HD Friday.  Would set EDW 66kg for now. HTN/volume - stable. Anemia of CKD Stage V - Hgb in 7s without overt bleeding.  Started ESA and IV iron.  TSAT 22%, ferritin 84.  Transfuse for Hgb <7. Secondary hyperparathyroidism/CKD-BMD - phos 8.7 now at goal on binder and diet. PTH 56. Vascular access - s/p AVF 7/8, will see VVS in office in 6 weeks.  Severe protein malnutrition - alb <1.5.  recommend protein supplementation.    Additional recommendations: - Dose all meds for creatinine clearance < 10 ml/min  - Unless absolutely necessary, no MRIs with gadolinium.  - Implement save arm precautions.  Prefer needle sticks in the dorsum of the hands or wrists.  No blood pressure measurements in arm. - If blood transfusion is requested during hemodialysis sessions, please alert [place] prior to the session.  - Use synthetic opioids (Fentanyl/Dilaudid) if needed  Estill Bakes MD [facility] Pager [phone]

## 2023-03-19 NOTE — Plan of Care (Signed)

## 2023-03-19 NOTE — TOC Progression Note (Signed)
Transition of Care [facility]) - Progression Note    Patient Details  Name: Stacey Lucas MRN: [id number] Date of Birth: 10-24-1975  Transition of Care [facility]) CM/SW Contact  Janiaya Ryser A [place], [place] Phone Number: 03/19/2023, 9:57 AM  Clinical Narrative:     CSW contacted pt's insurance [phone] regarding transportation for HD. CSW was informed that pt had scheduled transportation and was scheduled for 11am Friday, July 12th for at HD clinic [facility] in [facility].        Expected Discharge Plan and Services                                               Social Determinants of Health (SDOH) Interventions SDOH Screenings   Food Insecurity: No Food Insecurity (03/15/2023)  Housing: Low Risk  (03/14/2023)  Transportation Needs: Unmet Transportation Needs (03/16/2023)  Utilities: At Risk (03/16/2023)  Alcohol Screen: Low Risk  (04/25/2018)  Depression (PHQ2-9): Low Risk  (02/04/2023)  Recent Concern: Depression (PHQ2-9) - Medium Risk (12/03/2022)  Tobacco Use: High Risk (03/18/2023)    Readmission Risk Interventions     No data to display

## 2023-03-19 NOTE — Progress Notes (Signed)
Explained discharge instructions to patient. Reviewed follow up appointment and next medication administration times. Also reviewed education. Patient verbalized having an understanding for instructions given. All belongings are in the patient's possession to include TOC meds. IV removed. No other needs verbalized. Will transport to discharge lounge to await her taxi.

## 2023-03-19 NOTE — Progress Notes (Addendum)
Met with pt at bedside this morning to discuss out-pt HD arrangements. Pt provided an information sheet with clinic details and schedule info. Pt also provided dialysis education packet. Pt agreeable to plan. Will add arrangements to pt's AVS as well. Team advised that pt has been advised of arrangements and agreeable to plan.   Olivia Canter Renal Navigator [phone]  Addendum at 2:18 pm: D/C order noted. Contacted Carla at [facility] out-pt HD to be advised of pt's d/c today and that pt should start on Friday as planned. Will fax d/c summary and last renal note to Beverly/Carla once available.

## 2023-03-19 NOTE — Discharge Summary (Signed)
Name: Stacey Lucas MRN: [id number] DOB: [birth year] 47 y.o. PCP: Storm Frisk, MD  Date of Admission: 03/13/2023  2:41 PM Date of Discharge: 03/19/2023 4:32 PM Attending Physician: Dr. Criselda Peaches  Discharge Diagnosis: Principal Problem:   ESRD on dialysis [facility]) Active Problems:   Essential hypertension   Atrial fibrillation (HCC)   Anemia    Discharge Medications: Allergies as of 03/19/2023       Reactions   Neurontin [gabapentin] Other (See Comments)   Weakness  Balance impairment         Medication List     STOP taking these medications    ondansetron 4 MG tablet Commonly known as: ZOFRAN       TAKE these medications    Acetaminophen Extra Strength 500 MG Tabs Take 2 tablets (1,000 mg total) by mouth every 8 (eight) hours as needed for up to 5 days for mild pain (or Fever >/= 101).   amLODipine 10 MG tablet Commonly known as: NORVASC Take 1 tablet (10 mg total) by mouth daily.   apixaban 2.5 MG Tabs tablet Commonly known as: ELIQUIS Take 1 tablet (2.5 mg total) by mouth 2 (two) times daily.   atorvastatin 80 MG tablet Commonly known as: LIPITOR Take 1 tablet (80 mg total) by mouth daily.   azelastine 0.05 % ophthalmic solution Commonly known as: OPTIVAR Place 1 drop into both eyes 2 (two) times daily.   blood glucose meter kit and supplies Kit Dispense based on patient and insurance preference. Use up to four times daily as directed.   carvedilol 6.25 MG tablet Commonly known as: COREG Take 0.5 tablets (3.125 mg total) by mouth 2 (two) times daily with a meal. What changed: how much to take   sevelamer carbonate 800 MG tablet Commonly known as: RENVELA Take 1 tablet (800 mg total) by mouth 3 (three) times daily with meals.               Discharge Care Instructions  (From admission, onward)           Start     Ordered   03/19/23 0000  Leave dressing on - Keep it clean, dry, and intact until clinic visit        03/19/23 1342            Disposition and follow-up:   Ms.Srinidhi Rode was discharged from [facility] in Good condition.  At the hospital follow up visit please address:  1.  Follow-up:  ESRD - Patient on outpatient schedule. Started on phosphate binder sevelamer carbonate 800 mg TID. Experienced severe pain in L arm at fistula site that improved over time. Initially required IV dilaudid, discharged with Tylenol 1000 mg q8h for 5 days as needed. Can continue regular follow-up of creatinine and overall kidney function.   Microcytic anemia  - Hgb low but stable ranging 7.2-7.7, with Hgb 7.2 at time of discharge with no symptoms of weakness, lightheadedness, or confusion. Will need CBC.    Atrial fibrillation  - Resumed apixaban 2.5 mg BID during admission, held briefly for 7/8 fistula by vascular surgery and promptly resumed. Nothing to follow-up other than patient taking regularly.    Chronic hypertension  - Patient started on home medications including amlodipine and carvedilol. Blood pressures ranged widely throughout the stay, including 163/64, 159/75, 163/73. Blood pressure this morning was 115/94. Home dose carvedilol 3.25 mg BID was given as 6.50 mg BID during admission. Need to address dosing of carvedilol with patient at  follow-up visit.  2.  Labs / imaging needed at time of follow-up: CBC, RFP, CBGs  3.  Pending labs/ test needing follow-up: none from inpatient  4.  Medication Changes  STOPPED  - n/a   ADDED  - Atorvastatin 80 mg  - Sevelamer carbonate 800 mg TID  - Tylenol 1000 mg q8h for 5 days    MODIFIED  - Carvedilol 6.25 mg BID    Follow-up Appointments:  Follow-up Information     [facility] Follow up.   Why: 4-6 weeks.The office will call the patient with an appointment Contact information: [address] [place] [address] [phone]        [facility]. Go on 03/21/2023.   Why: Schedule is  Monday,Wednesday, Friday with 11:20 chair time.  On Friday, please arrive at 11:00 for paperwork prior to treatment.   [address] [place], [place] [address] [phone]                Hospital Course by problem list: Summary Keylani Perlstein is a 12 female with PMH of ischemic infarct (2023, 2024), Afib, HLD, HTN, sciatica, GERD, previous cocaine use, depression, and ESRD (not on dialysis) presenting to ED with one week history of N/V, diarrhea, and abdominal discomfort. Patient was recently seen in [facility] ED 7/01 for nausea and diarrhea. Also complained of some some pain in her lower back. Denied taking any medications to help with symptoms at that time. Mild generalized abdominal tenderness on exam. No CVA tenderness. Relevant labs at the time included Cr. 9.33, Calcium 8.3, Albumin 1.7, AST 12, and Hb 9.5. Patient admitted for colitis, likely viral in nature, and hemodialysis management, including permanent catheter placement by IR.   ESRD secondary to FSGS Microcytic anemia RIJ TDC placed 03/14/23. Received 3 total HD session (7/05, 7/06, and 7/09), well tolerated. Renal function stable. Vascular surgery performed permanent dialysis access on 7/8 with an a/v graft fistula on the left arm. Apixaban/Eliquis held by Dr. Henry Russel (vascular surgery) for the procedure. Plan for outpatient hemodialysis in [facility]. Patient experienced some pain around the left arm fistula, given IV dilaudid for severe pain per nephrology recommendations.    Atrial fibrillation Rate controlled. Resumed Apixaban 2.5 mg BID after permanent dialysis access on 7/8.     Chronic hypertension Elevated throughout the stay, though asymptomatic. Continued therapy with carvedilol 6.25 mg BID and amlodipine 10 mg with as needed hydralazine if persistently elevated.   Loose stools Intermittent without clear pattern, likely viral initially then in the setting of new renal medications/binders. Improved over time with imodium as  needed.    T2DM A1C 5.6 11/14/2022. Type 2 diabetes controlled, not taking any diabetes medications. Given Dextrose 50, 50mL 7/05 AM 2/2 CBG 61. Monitored CBGs throughout stay.   HLD Was on renally dosed rosuvastatin 10 mg as of 11/14/22 admission at [facility]. This was not a medication she endorsed taking. Started her on atorvastatin 80 mg.    Depression Patient previously restarted on Celexa and Wellbutrin during 11/14/22 [facility] admission. Patient reported not taking these medications currently.    Discharge Subjective: Patient with no concerns this morning, she was comfortable and did not have any complaints of headache, chest pain, nausea, vomiting, or diarrhea. She did not have any questions and said she would establish a follow-up appointment with Dr. Delford Field.   Discharge Exam:   Blood pressure (!) 115/94, pulse 84, temperature 97.9 F (36.6 C), resp. rate 19, height 5\' 5"  (1.651 m),  weight 67.6 kg, last menstrual period 10/11/2019, SpO2 100 %.  Constitutional: Well-appearing, sitting up in chair, in no acute distress.  HENT: normocephalic atraumatic, mucous membranes moist. Cardiovascular: regular rate and rhythm, no m/r/g.  Pulmonary/Chest: normal work of breathing on room air, lungs clear to auscultation bilaterally.  Abdominal: soft, non-tender, non-distended.  Neurological: alert & oriented x 3 Skin: warm and dry Psych: Normal mood and affect  Pertinent Labs, Studies, and Procedures:     Latest Ref Rng & Units 03/19/2023    3:33 AM 03/18/2023    3:55 AM 03/17/2023    6:23 PM  CBC  WBC 4.0 - 10.5 K/uL 11.3  12.5  13.2   Hemoglobin 12.0 - 15.0 g/dL 7.2  7.6  7.5   Hematocrit 36.0 - 46.0 % 22.5  24.0  23.4   Platelets 150 - 400 K/uL 226  238  225        Latest Ref Rng & Units 03/19/2023    3:33 AM 03/18/2023    3:55 AM 03/17/2023    5:42 AM  CMP  Glucose 70 - 99 mg/dL 90  161  096   BUN 6 - 20 mg/dL 26  49  39   Creatinine 0.44 - 1.00 mg/dL 0.45  4.09  8.11   Sodium 135 - 145 mmol/L  135  137  137   Potassium 3.5 - 5.1 mmol/L 3.4  3.5  3.8   Chloride 98 - 111 mmol/L 103  107  105   CO2 22 - 32 mmol/L 26  23  24    Calcium 8.9 - 10.3 mg/dL 7.5  7.4  7.2     IR Fluoro Guide CV Line Right  Result Date: 03/14/2023 INDICATION: ESRD requiring HD. EXAM: TUNNELED CENTRAL VENOUS HEMODIALYSIS CATHETER PLACEMENT WITH ULTRASOUND AND FLUOROSCOPIC GUIDANCE MEDICATIONS: Ancef 2 gm IV . The antibiotic was given in an appropriate time interval prior to skin puncture. ANESTHESIA/SEDATION: Moderate (conscious) sedation was employed during this procedure. A total of Versed 1 mg and Fentanyl 25 mcg was administered intravenously. Moderate Sedation Time: 13 minutes. The patient's level of consciousness and vital signs were monitored continuously by radiology nursing throughout the procedure under my direct supervision. FLUOROSCOPY TIME:  Fluoroscopic dose; 0 mGy COMPLICATIONS: None immediate. PROCEDURE: Informed written consent was obtained from the patient after a discussion of the risks, benefits, and alternatives to treatment. Questions regarding the procedure were encouraged and answered. The RIGHT neck and chest were prepped with chlorhexidine in a sterile fashion, and a sterile drape was applied covering the operative field. Maximum barrier sterile technique with sterile gowns and gloves were used for the procedure. A timeout was performed prior to the initiation of the procedure. After creating a small venotomy incision, a micropuncture kit was utilized to access the internal jugular vein. Real-time ultrasound guidance was utilized for vascular access including the acquisition of a permanent ultrasound image documenting patency of the accessed vessel. The microwire was utilized to measure appropriate catheter length. A stiff Glidewire was advanced to the level of the IVC and the micropuncture sheath was exchanged for a peel-away sheath. A palindrome tunneled hemodialysis catheter measuring 19 cm from  tip to cuff was tunneled in a retrograde fashion from the anterior chest wall to the venotomy incision. The catheter was then placed through the peel-away sheath with tips ultimately positioned within the superior aspect of the right atrium. Final catheter positioning was confirmed and documented with a spot radiographic image. The catheter aspirates and flushes normally. The  catheter was flushed with appropriate volume heparin dwells. The catheter exit site was secured with a 2-0 Ethilon retention suture. The venotomy incision was closed with Dermabond. Dressings were applied. The patient tolerated the procedure well without immediate post procedural complication. IMPRESSION: Successful placement of a 19 cm tip to cuff tunneled hemodialysis catheter via the RIGHT internal jugular vein The tip of the catheter is positioned within the proximal RIGHT atrium. The catheter is ready for immediate use. Roanna Banning, MD [facility] Electronically Signed   By: Roanna Banning M.D.   On: 03/14/2023 17:18   IR US Guide Vasc Access Right  Result Date: 03/14/2023 INDICATION: ESRD requiring HD. EXAM: TUNNELED CENTRAL VENOUS HEMODIALYSIS CATHETER PLACEMENT WITH ULTRASOUND AND FLUOROSCOPIC GUIDANCE MEDICATIONS: Ancef 2 gm IV . The antibiotic was given in an appropriate time interval prior to skin puncture. ANESTHESIA/SEDATION: Moderate (conscious) sedation was employed during this procedure. A total of Versed 1 mg and Fentanyl 25 mcg was administered intravenously. Moderate Sedation Time: 13 minutes. The patient's level of consciousness and vital signs were monitored continuously by radiology nursing throughout the procedure under my direct supervision. FLUOROSCOPY TIME:  Fluoroscopic dose; 0 mGy COMPLICATIONS: None immediate. PROCEDURE: Informed written consent was obtained from the patient after a discussion of the risks, benefits, and alternatives to treatment. Questions  regarding the procedure were encouraged and answered. The RIGHT neck and chest were prepped with chlorhexidine in a sterile fashion, and a sterile drape was applied covering the operative field. Maximum barrier sterile technique with sterile gowns and gloves were used for the procedure. A timeout was performed prior to the initiation of the procedure. After creating a small venotomy incision, a micropuncture kit was utilized to access the internal jugular vein. Real-time ultrasound guidance was utilized for vascular access including the acquisition of a permanent ultrasound image documenting patency of the accessed vessel. The microwire was utilized to measure appropriate catheter length. A stiff Glidewire was advanced to the level of the IVC and the micropuncture sheath was exchanged for a peel-away sheath. A palindrome tunneled hemodialysis catheter measuring 19 cm from tip to cuff was tunneled in a retrograde fashion from the anterior chest wall to the venotomy incision. The catheter was then placed through the peel-away sheath with tips ultimately positioned within the superior aspect of the right atrium. Final catheter positioning was confirmed and documented with a spot radiographic image. The catheter aspirates and flushes normally. The catheter was flushed with appropriate volume heparin dwells. The catheter exit site was secured with a 2-0 Ethilon retention suture. The venotomy incision was closed with Dermabond. Dressings were applied. The patient tolerated the procedure well without immediate post procedural complication. IMPRESSION: Successful placement of a 19 cm tip to cuff tunneled hemodialysis catheter via the RIGHT internal jugular vein The tip of the catheter is positioned within the proximal RIGHT atrium. The catheter is ready for immediate use. Roanna Banning, MD [facility] Electronically Signed   By: Roanna Banning M.D.   On: 03/14/2023  17:18     Discharge Instructions: Discharge Instructions     Call MD for:  difficulty breathing, headache or visual disturbances   Complete by: As directed    Call MD for:  extreme fatigue   Complete by: As directed    Call MD for:  hives   Complete by: As directed    Call MD for:  persistant dizziness or light-headedness   Complete by: As directed  Call MD for:  persistant nausea and vomiting   Complete by: As directed    Call MD for:  redness, tenderness, or signs of infection (pain, swelling, redness, odor or green/yellow discharge around incision site)   Complete by: As directed    Call MD for:  severe uncontrolled pain   Complete by: As directed    Call MD for:  temperature >100.4   Complete by: As directed    Diet - low sodium heart healthy   Complete by: As directed    Discharge instructions   Complete by: As directed    PATIENT INSTRUCTIONS:   - You were admitted for treatment of nausea, vomiting, diarrhea, and hemodialysis management. Your nausea, vomiting, and diarrhea resolved with symptom management. We think your illness may have been viral.   - For your hemodialysis management, a tunneled catheter was placed on 7/05 by interventional radiology, and vascular surgery secured left arm access for continued dialysis via an artery/vein graft. You received 3 hemodialysis sessions during your stay (7/05, 7/06, 7/09). Please continue going to your dialysis sessions in the outpatient setting.   - You have been experiencing some pain on your left arm at the site of the fistula. You can take Tylenol 1000 mg every 8 hours as needed for 5 days, but you can stop taking it regularly before the 5 days are done if your pain is improved and it is no longer needed.   - We started you on a phosphate binder to keep your phosphate levels low, especially while you get dialysis treatments. Please continue to take Sevelamer carbonate (RENVELA) 800 mg three times a day with meals. Your primary  care doctor (Dr. Delford Field), can renew your prescription if you run out.   - Please continue to take your Apixaban/Eliquis 2.5 mg twice a day for atrial fibrillation (your heart).   - Please continue to take your other home medications, including:  Amlodipine (NORVASC) tablet 10 mg, Atorvastatin (LIPITOR) 80 mg, and Carvedilol (COREG) 6.25 mg twice a day.   - Please make an appointment with your primary care provider, Dr. Delford Field, to follow up on your labs, pain, medication management, and any other symptoms you may be having.   - Please don't hesitate to call our clinic [facility]) at [phone] between 8 AM - 5 PM if you have any questions or if you have any worsening pain, nausea, vomiting, diarrhea, weakness, confusion, or other new symptom. If you have an emergency, please call 911.   It was a pleasure taking care of you during your stay!   -Dr. Justin Mend   Increase activity slowly   Complete by: As directed    Leave dressing on - Keep it clean, dry, and intact until clinic visit   Complete by: As directed        Signed: Aleiyah Halpin Colbert Coyer, MD [facility] - PGY1 Pager: [phone] 03/19/2023, 4:32 PM    Please contact the on call pager after 5 pm and on weekends at [phone].

## 2023-03-20 ENCOUNTER — Other Ambulatory Visit: Payer: Medicaid Other | Admitting: Licensed Clinical Social Worker

## 2023-03-20 NOTE — Progress Notes (Signed)
Late Note Entry  D/C summary and last renal note faxed to [facility] at [facility] out-pt HD.   Olivia Canter Renal Navigator [phone]

## 2023-03-20 NOTE — Patient Outreach (Signed)
Medicaid Managed Care Social Work Note  03/20/2023 Name:  Stacey Lucas MRN:  [id number] DOB:  1975-09-27  Stacey Lucas is an 47 y.o. year old female who is a primary patient of Storm Frisk, MD.  The Medicaid Managed Care Coordination team was consulted for assistance with:  Mental Health Counseling and Resources  [name] was given information about Medicaid Managed Care Coordination team services today. [name] Patient agreed to services and verbal consent obtained.  Engaged with patient  for by telephone forinitial visit in response to referral for case management and/or care coordination services.   Assessments/Interventions:  Review of past medical history, allergies, medications, health status, including review of consultants reports, laboratory and other test data, was performed as part of comprehensive evaluation and provision of chronic care management services.  SDOH: (Social Determinant of Health) assessments and interventions performed: SDOH Interventions    Flowsheet Row Patient Outreach Telephone from 03/20/2023 in [place] [facility] to Hosp-Admission (Discharged) from 03/13/2023 in [place] [facility] Office Visit from 12/03/2022 in [facility] ED to Hosp-Admission (Discharged) from 11/14/2022 in [place] [facility]  SDOH Interventions      Food Insecurity Interventions -- -- -- Inpatient [facility]  Transportation Interventions -- Taxi Voucher Given -- Inpatient TOC  Utilities Interventions -- -- -- Inpatient TOC  Depression Interventions/Treatment  -- -- Counseling, Currently on Treatment --  Stress Interventions Offered [employer], Provide Counseling -- -- --       Advanced Directives Status:  Not addressed in this encounter.  Care Plan                 Allergies  Allergen Reactions   Neurontin [Gabapentin] Other (See Comments)    Weakness  Balance impairment      Medications Reviewed Today     Reviewed by Cy Blamer, CRNA (Certified Registered Nurse Anesthetist) on 03/17/23 at 1136  Med List Status: Complete   Medication Order Taking? Sig Documenting Provider Last Dose Status Informant  amLODipine (NORVASC) 10 MG tablet [id number] Yes Take 1 tablet (10 mg total) by mouth daily. Storm Frisk, MD 03/13/2023 Active Self, Pharmacy Records  apixaban [facility]) 2.5 MG TABS tablet [id number] Yes Take 1 tablet (2.5 mg total) by mouth 2 (two) times daily. Storm Frisk, MD 03/13/2023 AM Active Self, Pharmacy Records           Med Note (COFFELL, Marzella Schlein   Thu Mar 13, 2023  5:59 PM) Pt unable to recall exact time of last dose.  atorvastatin (LIPITOR) 80 MG tablet [id number] Yes Take 1 tablet (80 mg total) by mouth daily. Storm Frisk, MD 03/13/2023 Active Self, Pharmacy Records           Med Note (COFFELL, Marzella Schlein   Thu Mar 13, 2023  5:59 PM) Pt states she is still taking this medication. Per dispense report, LF 11/21/2022 #30, 30 DS.  azelastine (OPTIVAR) 0.05 % ophthalmic solution [id number] No Place 1 drop into both eyes 2 (two) times daily.  Patient not taking: Reported on 03/13/2023   Noel Journey Not Taking Active Self, Pharmacy Records           Med Note Jola Schmidt   KGM Nov 16, 2022  8:05 AM)    blood glucose meter kit and supplies KIT [id number]  Dispense based on patient and insurance preference. Use up to four times daily as directed. Shtridelman,  Benjie Karvonen, MD  Active Self, Pharmacy Records  carvedilol (COREG) 6.25 MG tablet [id number] Yes Take 0.5 tablets (3.125 mg total) by mouth 2 (two) times daily with a meal.  Patient taking differently: Take 6.25 mg by mouth 2 (two) times daily with a meal.   Storm Frisk, MD 03/13/2023 Expired 03/13/23 2359 Self, Pharmacy Records  ondansetron (ZOFRAN) 4 MG tablet [id number] No Take 1 tablet (4 mg total) by mouth every 8 (eight) hours as needed for up to 3 days for nausea or vomiting.   Patient not taking: Reported on 03/13/2023   Tonette Lederer, PA-C Not Taking Active Self, Pharmacy Records            Patient Active Problem List   Diagnosis Date Noted   Gait disturbance, post-stroke 03/06/2023   ESRD on dialysis [facility]) 02/04/2023   Polysubstance abuse (HCC) 12/03/2022   Nephrotic syndrome 11/15/2022   Cerebrovascular accident (CVA) (HCC) 11/15/2022   Anemia 01/16/2022   Atrial fibrillation (HCC) 07/04/2021   H/O: stroke 07/04/2021   Osteoarthritis of right knee 05/24/2021   Type 2 diabetes mellitus with other specified complication, without long-term current use of insulin (HCC) 05/23/2021   Essential hypertension 05/23/2021   Hyperlipidemia 05/23/2021   Thyroid nodule 05/23/2021   MDD (major depressive disorder), recurrent severe, without psychosis (HCC) 04/27/2018    Conditions to be addressed/monitored per PCP order:  Depression  Care Plan : LCSW Plan of Care  Updates made by Gustavus Bryant, LCSW since 03/20/2023 12:00 AM     Problem: Coping Skill and Support System Improvement      Goal: Leisure centre manager and Support Network Enhanced   Priority: High  Note:   Priority: High   Timeframe:  Short-Range Goal Priority:  High Start Date:  03/20/23            Expected End Date:  ongoing                     Follow Up Date--04/03/23 at 11:15 am   - keep 90 percent of scheduled appointments -consider counseling or psychiatry -consider bumping up your self-care  -consider creating a stronger support network    Why is this important?             Combatting depression/stress may take some time.            If you don't feel better right away, don't give up on your treatment plan.     Current barriers:   Chronic Mental Health needs related to depression and substance abuse Patient requires Support, Education, Resources, Referrals, Advocacy, and Care Coordination, in order to meet Unmet Mental Health Needs. Limited Support Network Patient will implement  clinical interventions discussed today to decrease symptoms of depression and increase knowledge and/or ability of: coping skills. Social Isolation Patient lacks knowledge of available community counseling agencies and resources. Possible Cognitive delay and memory concerns   Clinical Goal(s): verbalize understanding of plan for management of Relapse prevention and Depression/stress and demonstrate a reduction in symptoms. Patient will connect with a provider for ongoing mental health treatment, increase coping skills, healthy habits, self-management skills, and stress reduction        Clinical Interventions:  Assessed patient's previous and current treatment, coping skills, support system and barriers to care. Patient and spouse Clifton Custard provided hx. Patient was just discharged from the hospital yesterday and reports issues with her health and cognition.  Verbalization of feelings encouraged, motivational interviewing employed Emotional support provided, positive  coping strategies explored. Patient reports that her parents are sick and are in [place], [place] which is where she is from. She not seen them since May of this year due to health issues.  Relapse prevention education provided. Patient admits to ongoing alcohol and cocaine use. Her last usage date is the day before her hospitalization on 03/13/23. Patient was educated on the difference between therapy and psychiatry per patient requestPatient was educated on available mental health resources within their area that accept Medicaid and offer counseling and psychiatry. Patient reports that she and her spouse both check her email. Email sent to patient today with available mental health resources within her area that accept Medicaid and offer the services that she is interested in. Email included crisis support resources. Patient will review resources over the next two weeks and make a decision regarding where she wishes to gain MH treatment  at. Patient reports having memory issues Patient will work on implementing appropriate self-care habits into her daily routine such as: staying positive, writing a gratitude list, drinking water, staying active around the house, taking their medications as prescribed, combating negative thoughts or emotions and staying connected with positive people, places and things. Positive reinforcement provided for this decision to work on this. Patient reports interest in counseling but is unsure about psychiatry .  Motivational Interviewing employed Depression screen reviewed  PHQ2/ PHQ9 completed or reviewed  Mindfulness or Relaxation training provided Active listening / Reflection utilized  Advance Care and HCPOA education provided Emotional Support Provided Problem Solving /Task Center strategies reviewed Provided psychoeducation for mental health needs  Provided brief CBT  Reviewed mental health medications and discussed importance of compliance:  Quality of sleep assessed & Sleep Hygiene techniques promoted  Participation in counseling encouraged  Verbalization of feelings encouraged  Suicidal Ideation/Homicidal Ideation assessed: Patient denies SI/HI  Review resources, discussed options and provided patient information about  Mental Health Resources Inter-disciplinary care team collaboration (see longitudinal plan of care) Referral made to [employer] BSW for financial and community resource education. Spouse reports that he wishes to get familiar with the resources that are available in this area. [facility] LCSW update entire [facility] team. Patient Goals/Self-Care Activities: Over the next 120 days Attend scheduled medical appointments Utilize healthy coping skills and supportive resources discussed Contact PCP with any questions or concerns Keep 90 percent of counseling appointments Call your insurance provider for more information about your Enhanced Benefits  Check out counseling resources provided  Begin  personal counseling with LCSW, to reduce and manage symptoms of Depression and Stress, until well-established with mental health provider Accept all calls from representative with [place] in an effort to promote health and gain supportive services. Incorporate into daily practice - relaxation techniques, deep breathing exercises, and mindfulness meditation strategies. Talk about feelings with friends, family members, spiritual advisor, etc. Contact LCSW directly [phone]), if you have questions, need assistance, or if additional social work needs are identified between now and our next scheduled telephone outreach call. Call 988 for mental health hotline/crisis line if needed (24/7 available) Try techniques to reduce symptoms of anxiety/negative thinking (deep breathing, distraction, positive self talk, etc)  - develop a personal safety plan - develop a plan to deal with triggers like holidays, anniversaries - exercise at least 2 to 3 times per week - have a plan for how to handle bad days - journal feelings and what helps to feel better or worse - spend time or talk with others at least 2 to 3  times per week - watch for early signs of feeling worse - begin personal counseling - call and visit an old friend - check out volunteer opportunities - join a support group - laugh; watch a funny movie or comedian - learn and use visualization or guided imagery - perform a random act of kindness - practice relaxation or meditation daily - start or continue a personal journal - practice positive thinking and self-talk -continue with compliance of taking medication  -identify current effective and ineffective coping strategies.  -implement positive self-talk in care to increase self-esteem, confidence and feelings of control.  -consider alternative and complementary therapy approaches such as meditation, mindfulness or yoga.  -journaling, prayer, worship services, meditation or pastoral  counseling.  -increase participation in pleasurable group activities such as hobbies, singing, sports or volunteering).  -consider the use of meditative movement therapy such as tai chi, yoga or qigong.  -start a regular daily exercise program based on tolerance, ability and patient choice to support positive thinking and activity     If you are experiencing a Mental Health or Behavioral Health Crisis or need someone to talk to, please call the Suicide and Crisis Lifeline: 988    Patient Goals: Initial goal         02/04/2023    2:46 PM 12/03/2022   10:37 AM 04/18/2022   10:14 AM 03/05/2022    9:58 AM 01/31/2022   10:09 AM  Depression screen PHQ 2/9  Decreased Interest 0 2 1 0 0  Down, Depressed, Hopeless 0 1 0 0 0  PHQ - 2 Score 0 3 1 0 0  Altered sleeping  3 0 0 0  Tired, decreased energy  3 1 3 1   Change in appetite   0 0 0  Feeling bad or failure about yourself   1 0 0 0  Trouble concentrating  0 0 0 0  Moving slowly or fidgety/restless  0 1 1 0  Suicidal thoughts  0 0 0 0  PHQ-9 Score  10 3 4 1       02/04/2023    2:46 PM 12/03/2022   10:37 AM 04/18/2022   10:14 AM 03/05/2022    9:59 AM  GAD 7 : Generalized Anxiety Score  Nervous, Anxious, on Edge 0 0 0 0  Control/stop worrying 0 0 0 1  Worry too much - different things 0 0 0 0  Trouble relaxing 0 0 0 0  Restless 0 0 0 0  Easily annoyed or irritable 0 0 1 0  Afraid - awful might happen 0 0 0 0  Total GAD 7 Score 0 0 1 1       24- Hour Availability:    [facility]  [address] [place], [place] [phone] Crisis [phone]   [facility] [phone]  [place] [facility]  [phone]    [facility]  [phone] (after hours)   Therapeutic Alternative/Mobile Crisis   [phone]   [place] National Suicide Hotline  (947) 602-7592 (TALK) [place] [address]   Call [phone] for mental health emergencies   [facility]   [phone]);  [place] and [employer]  [phone]); [place], [place], [place], [place], [place], [place], [place]    [facility] Residents For 24/7 walk-up access to mental health services for [facility] children (4+), adolescents and adults, please visit the [facility] located at [address] in [place], [place].  *[facility] also provides comprehensive outpatient behavioral health services in a variety of locations around the [place].  Connect With [place] [address] [place], [place] [address] HelpLine: (773)340-0937 or 1-215-116-9239  Get Directions  Find Help 24/7 By Phone Call our 24-hour HelpLine at (873)203-8704 or 513-373-3660 for immediate assistance for mental health and substance abuse issues.  Walk-In Help [place]: [facility] (Ages 4 and Up) [place]: Emergency Dept., [facility] Additional Resources National Hopeline Network: 1-800-SUICIDE The National Suicide Prevention Lifeline: 1-800-273-TALK     Follow up:  Patient agrees to Care Plan and Follow-up.  Plan: The Managed Medicaid care management team will reach out to the patient again over the next 30 days.  Dickie La, BSW, MSW, [employer] Managed Medicaid LCSW [facility]  [facility] [email or web address] Phone: [phone]

## 2023-03-20 NOTE — Patient Instructions (Signed)
Visit Information  Stacey Lucas was given information about Medicaid Managed Care team care coordination services as a part of their [employer] Medicaid benefit. Stacey Lucas verbally consentedto engagement with the [facility] Managed Care team.   If you are experiencing a medical emergency, please call 911 or report to your local emergency department or urgent care.   If you have a non-emergency medical problem during routine business hours, please contact your provider's office and ask to speak with a nurse.   For questions related to your [facility] health plan, please call: [phone]  OR visit the member homepage at: [email or web address]  If you would like to schedule transportation through your [facility] plan, please call the following number at least 2 days in advance of your appointment: [phone]  If you are experiencing a behavioral health crisis, call the [facility] Crisis Line at [phone]). The line is available 24 hours a day, seven days a week.  If you would like help to quit smoking, call 1-800-QUIT-NOW (812-232-0086) OR Espaol: 1-855-Djelo-Ya (6-644-034-7425) o para ms informacin haga clic aqu or Text READY to 956-387 to register via text   Following is a copy of your plan of care:  Care Plan : LCSW Plan of Care  Updates made by Gustavus Bryant, LCSW since 03/20/2023 12:00 AM     Problem: Coping Skill and Support System Improvement      Goal: Tourist information centre manager: High  Note:   Priority: High   Timeframe:  Short-Range Goal Priority:  High Start Date:  03/20/23            Expected End Date:  ongoing                     Follow Up Date--04/03/23 at 11:15 am   - keep 90 percent of scheduled appointments -consider counseling or psychiatry -consider bumping up your self-care   -consider creating a stronger support network    Why is this important?             Combatting depression/stress may take some time.            If you don't feel better right away, don't give up on your treatment plan.     Current barriers:   Chronic Mental Health needs related to depression and substance abuse Patient requires Support, Education, Resources, Referrals, Advocacy, and Care Coordination, in order to meet Unmet Mental Health Needs. Limited Support Network Patient will implement clinical interventions discussed today to decrease symptoms of depression and increase knowledge and/or ability of: coping skills. Social Isolation Patient lacks knowledge of available community counseling agencies and resources. Possible Cognitive delay and memory concerns   Clinical Goal(s): verbalize understanding of plan for management of Relapse prevention and Depression/stress and demonstrate a reduction in symptoms. Patient will connect with a provider for ongoing mental health treatment, increase coping skills, healthy habits, self-management skills, and stress reduction        Patient Goals/Self-Care Activities: Over the next 120 days Attend scheduled medical appointments Utilize healthy coping skills and supportive resources discussed Contact PCP with any questions or concerns Keep 90 percent of counseling appointments Call your insurance provider for more information about your Enhanced Benefits  Check out counseling resources provided  Begin personal counseling with LCSW, to reduce and manage symptoms of Depression and Stress, until well-established with mental health provider Accept all calls from representative with  [place] in an effort to promote health and gain supportive services. Incorporate into daily practice - relaxation techniques, deep breathing exercises, and mindfulness meditation strategies. Talk about feelings with friends, family members, spiritual advisor,  etc. Contact LCSW directly [phone]), if you have questions, need assistance, or if additional social work needs are identified between now and our next scheduled telephone outreach call. Call 988 for mental health hotline/crisis line if needed (24/7 available) Try techniques to reduce symptoms of anxiety/negative thinking (deep breathing, distraction, positive self talk, etc)  - develop a personal safety plan - develop a plan to deal with triggers like holidays, anniversaries - exercise at least 2 to 3 times per week - have a plan for how to handle bad days - journal feelings and what helps to feel better or worse - spend time or talk with others at least 2 to 3 times per week - watch for early signs of feeling worse - begin personal counseling - call and visit an old friend - check out volunteer opportunities - join a support group - laugh; watch a funny movie or comedian - learn and use visualization or guided imagery - perform a random act of kindness - practice relaxation or meditation daily - start or continue a personal journal - practice positive thinking and self-talk -continue with compliance of taking medication  -identify current effective and ineffective coping strategies.  -implement positive self-talk in care to increase self-esteem, confidence and feelings of control.  -consider alternative and complementary therapy approaches such as meditation, mindfulness or yoga.  -journaling, prayer, worship services, meditation or pastoral counseling.  -increase participation in pleasurable group activities such as hobbies, singing, sports or volunteering).  -consider the use of meditative movement therapy such as tai chi, yoga or qigong.  -start a regular daily exercise program based on tolerance, ability and patient choice to support positive thinking and activity     If you are experiencing a Mental Health or Behavioral Health Crisis or need someone to talk to, please call  the Suicide and Crisis Lifeline: 988     10 LITTLE Things To Do When You're Feeling Too Down To Do Anything  Take a shower. Even if you plan to stay in all day long and not see a soul, take a shower. It takes the most effort to hop in to the shower but once you do, you'll feel immediate results. It will wake you up and you'll be feeling much fresher (and cleaner too).  Brush and floss your teeth. Give your teeth a good brushing with a floss finish. It's a small task but it feels so good and you can check 'taking care of your health' off the list of things to do.  Do something small on your list. Most of [place] have some small thing we would like to get done (load of laundry, sew a button, email a friend). Doing one of these things will make you feel like you've accomplished something.  Drink water. Drinking water is easy right? It's also really beneficial for your health so keep a glass beside you all day and take sips often. It gives you energy and prevents you from boredom eating.  Do some floor exercises. The last thing you want to do is exercise but it might be just the thing you need the most. Keep it simple and do exercises that involve sitting or laying on the floor. Even the smallest of exercises release chemicals in the brain that make you feel good.  Yoga stretches or core exercises are going to make you feel good with minimal effort.  Make your bed. Making your bed takes a few minutes but it's productive and you'll feel relieved when it's done. An unmade bed is a huge visual reminder that you're having an unproductive day. Do it and consider it your housework for the day.  Put on some nice clothes. Take the sweatpants off even if you don't plan to go anywhere. Put on clothes that make you feel good. Take a look in the mirror so your brain recognizes the sweatpants have been replaced with clothes that make you look great. It's an instant confidence booster.  Wash the dishes. A pile of  dirty dishes in the sink is a reflection of your mood. It's possible that if you wash up the dishes, your mood will follow suit. It's worth a try.  Cook a real meal. If you have the luxury to have a "do nothing" day, you have time to make a real meal for yourself. Make a meal that you love to eat. The process is good to get you out of the funk and the food will ensure you have more energy for tomorrow.  Write out your thoughts by hand. When you hand write, you stimulate your brain to focus on the moment that you're in so make yourself comfortable and write whatever comes into your mind. Put those thoughts out on paper so they stop spinning around in your head. Those thoughts might be the very thing holding you down.     24- Hour Availability:    [facility]  [address] [place], [place] [phone] Crisis [phone]   [facility] [phone]  [place] [facility]  [phone]    [facility]  [phone] (after hours)   Therapeutic Alternative/Mobile Crisis   [phone]   [place] National Suicide Hotline  (601)760-6191 [place] Child[place]) [place] [address]   Call 920-759-1617 for mental health emergencies   [facility]  [phone]);  [place] and [employer]  [phone]); [place], [place], [place], [place], [place], [place], [place]    [place] [facility] for [facility] Residents For 24/7 walk-up access to mental health services for [facility] children (4+), adolescents and adults, please visit the [facility] located at [address] in [place], [place].  *[facility] also provides comprehensive outpatient behavioral health services in a variety of locations around the [place].  Connect With [place] [address] [place], [place] [address] HelpLine: 2036877886 or [phone]  Get Directions  Find Help 24/7 By  Phone Call our 24-hour HelpLine at 838-612-5388 or [phone] for immediate assistance for mental health and substance abuse issues.  Walk-In Help [place]: [facility] (Ages 4 and Up) [place]: Emergency Dept., [facility] Additional Resources National Hopeline Network: 1-800-SUICIDE The National Suicide Prevention Lifeline: 0-626-948-NIOE      Patient Goals: Initial goal   Dickie La, BSW, MSW, [employer] Managed Medicaid LCSW [place]  [facility] [email or web address] Phone: [phone]

## 2023-03-21 ENCOUNTER — Telehealth: Payer: Self-pay | Admitting: Physician Assistant

## 2023-03-21 DIAGNOSIS — N186 End stage renal disease: Secondary | ICD-10-CM | POA: Diagnosis not present

## 2023-03-21 DIAGNOSIS — Z418 Encounter for other procedures for purposes other than remedying health state: Secondary | ICD-10-CM | POA: Diagnosis not present

## 2023-03-21 DIAGNOSIS — D509 Iron deficiency anemia, unspecified: Secondary | ICD-10-CM | POA: Diagnosis not present

## 2023-03-21 DIAGNOSIS — D631 Anemia in chronic kidney disease: Secondary | ICD-10-CM | POA: Diagnosis not present

## 2023-03-21 DIAGNOSIS — N2581 Secondary hyperparathyroidism of renal origin: Secondary | ICD-10-CM | POA: Diagnosis not present

## 2023-03-21 NOTE — Telephone Encounter (Signed)
-----   Message from [facility] sent at 03/18/2023  7:13 AM EDT ----- S/p left BC AVF 7/8.  Please have pt f/u in 6 weeks with dialysis duplex on Dr. Karin Lieu clinic day.  Thanks.

## 2023-03-24 ENCOUNTER — Ambulatory Visit: Payer: Medicaid Other | Admitting: Physical Therapy

## 2023-03-24 DIAGNOSIS — N2581 Secondary hyperparathyroidism of renal origin: Secondary | ICD-10-CM | POA: Diagnosis not present

## 2023-03-24 DIAGNOSIS — D509 Iron deficiency anemia, unspecified: Secondary | ICD-10-CM | POA: Diagnosis not present

## 2023-03-24 DIAGNOSIS — N186 End stage renal disease: Secondary | ICD-10-CM | POA: Diagnosis not present

## 2023-03-24 DIAGNOSIS — D631 Anemia in chronic kidney disease: Secondary | ICD-10-CM | POA: Diagnosis not present

## 2023-03-24 DIAGNOSIS — Z418 Encounter for other procedures for purposes other than remedying health state: Secondary | ICD-10-CM | POA: Diagnosis not present

## 2023-03-25 ENCOUNTER — Other Ambulatory Visit: Payer: Medicaid Other | Admitting: *Deleted

## 2023-03-25 NOTE — Patient Outreach (Signed)
  Medicaid Managed Care   Unsuccessful Attempt Note   03/25/2023 Name: Stacey Lucas MRN: [id number] DOB: 11-10-1975  Referred by: Storm Frisk, MD Reason for referral : High Risk Managed Medicaid (Unsuccessful RNCM initial telephone outreach)   An unsuccessful telephone outreach was attempted today. The patient was referred to the case management team for assistance with care management and care coordination.    Follow Up Plan: A HIPAA compliant phone message was left for the patient providing contact information and requesting a return call. and The Managed Medicaid care management team will reach out to the patient again over the next 7 days.    Estanislado Emms RN, BSN [place]  Managed [facility] RN Care Coordinator [phone]

## 2023-03-25 NOTE — Patient Instructions (Signed)
Visit Information  [name]  - as a part of your Medicaid benefit, you are eligible for care management and care coordination services at no cost or copay. I was unable to reach you by phone today but would be happy to help you with your health related needs. Please feel free to call me @ [phone].   A member of the Managed Medicaid care management team will reach out to you again over the next 7 days.   Estanislado Emms RN, BSN [place]  Managed [facility] RN Care Coordinator [phone]

## 2023-03-26 ENCOUNTER — Telehealth: Payer: Self-pay | Admitting: Physical Therapy

## 2023-03-26 ENCOUNTER — Ambulatory Visit: Payer: 59 | Admitting: Physical Therapy

## 2023-03-26 DIAGNOSIS — N186 End stage renal disease: Secondary | ICD-10-CM | POA: Diagnosis not present

## 2023-03-26 DIAGNOSIS — N2581 Secondary hyperparathyroidism of renal origin: Secondary | ICD-10-CM | POA: Diagnosis not present

## 2023-03-26 DIAGNOSIS — Z418 Encounter for other procedures for purposes other than remedying health state: Secondary | ICD-10-CM | POA: Diagnosis not present

## 2023-03-26 DIAGNOSIS — D509 Iron deficiency anemia, unspecified: Secondary | ICD-10-CM | POA: Diagnosis not present

## 2023-03-26 DIAGNOSIS — D631 Anemia in chronic kidney disease: Secondary | ICD-10-CM | POA: Diagnosis not present

## 2023-03-26 NOTE — Telephone Encounter (Signed)
I spoke to the patient and informed her that she is not eligible for another rollator yet and she said she understands.   Regarding the AFO, she said she is still waiting.   I also scheduled her with Dr Delford Field - 04/03/2023 @ 1050.   She requested that the appointment not be on M/W/F her dialysis days.

## 2023-03-26 NOTE — Telephone Encounter (Signed)
PT attempted to contact patient regarding several no shows/cancellations and canceling visits going forward.  Voicemail was not identified so left message for patient to call front desk regarding appt today.  Will hold on canceling visits until patient or identified voicemail can be reached or they return call.  Camille Bal, PT, [name]

## 2023-03-27 DIAGNOSIS — F1120 Opioid dependence, uncomplicated: Secondary | ICD-10-CM | POA: Diagnosis not present

## 2023-03-28 DIAGNOSIS — Z418 Encounter for other procedures for purposes other than remedying health state: Secondary | ICD-10-CM | POA: Diagnosis not present

## 2023-03-28 DIAGNOSIS — N2581 Secondary hyperparathyroidism of renal origin: Secondary | ICD-10-CM | POA: Diagnosis not present

## 2023-03-28 DIAGNOSIS — D631 Anemia in chronic kidney disease: Secondary | ICD-10-CM | POA: Diagnosis not present

## 2023-03-28 DIAGNOSIS — N186 End stage renal disease: Secondary | ICD-10-CM | POA: Diagnosis not present

## 2023-03-28 DIAGNOSIS — D509 Iron deficiency anemia, unspecified: Secondary | ICD-10-CM | POA: Diagnosis not present

## 2023-03-31 ENCOUNTER — Ambulatory Visit: Payer: 59 | Admitting: Physical Therapy

## 2023-03-31 DIAGNOSIS — D631 Anemia in chronic kidney disease: Secondary | ICD-10-CM | POA: Diagnosis not present

## 2023-03-31 DIAGNOSIS — N186 End stage renal disease: Secondary | ICD-10-CM | POA: Diagnosis not present

## 2023-03-31 DIAGNOSIS — N2581 Secondary hyperparathyroidism of renal origin: Secondary | ICD-10-CM | POA: Diagnosis not present

## 2023-03-31 DIAGNOSIS — Z418 Encounter for other procedures for purposes other than remedying health state: Secondary | ICD-10-CM | POA: Diagnosis not present

## 2023-03-31 DIAGNOSIS — D509 Iron deficiency anemia, unspecified: Secondary | ICD-10-CM | POA: Diagnosis not present

## 2023-04-02 ENCOUNTER — Ambulatory Visit: Payer: 59 | Admitting: Physical Therapy

## 2023-04-02 ENCOUNTER — Other Ambulatory Visit: Payer: Medicaid Other

## 2023-04-02 DIAGNOSIS — D509 Iron deficiency anemia, unspecified: Secondary | ICD-10-CM | POA: Diagnosis not present

## 2023-04-02 DIAGNOSIS — Z418 Encounter for other procedures for purposes other than remedying health state: Secondary | ICD-10-CM | POA: Diagnosis not present

## 2023-04-02 DIAGNOSIS — D631 Anemia in chronic kidney disease: Secondary | ICD-10-CM | POA: Diagnosis not present

## 2023-04-02 DIAGNOSIS — N2581 Secondary hyperparathyroidism of renal origin: Secondary | ICD-10-CM | POA: Diagnosis not present

## 2023-04-02 DIAGNOSIS — N186 End stage renal disease: Secondary | ICD-10-CM | POA: Diagnosis not present

## 2023-04-02 NOTE — Patient Instructions (Signed)
  Medicaid Managed Care   Unsuccessful Outreach Note  04/02/2023 Name: Stacey Lucas MRN: [id number] DOB: December 22, 1975  Referred by: Storm Frisk, MD Reason for referral : High Risk Managed Medicaid (MM Social work Unsuccessful telephone outreach )   An unsuccessful telephone outreach was attempted today. The patient was referred to the case management team for assistance with care management and care coordination.   Follow Up Plan: A HIPAA compliant phone message was left for the patient providing contact information and requesting a return call.   Abelino Derrick, MHA [facility]  Managed [facility] Social Worker [phone]

## 2023-04-02 NOTE — Patient Outreach (Signed)
  Medicaid Managed Care   Unsuccessful Outreach Note  04/02/2023 Name: Stacey Lucas MRN: [id number] DOB: December 22, 1975  Referred by: Storm Frisk, MD Reason for referral : High Risk Managed Medicaid (MM Social work Unsuccessful telephone outreach )   An unsuccessful telephone outreach was attempted today. The patient was referred to the case management team for assistance with care management and care coordination.   Follow Up Plan: A HIPAA compliant phone message was left for the patient providing contact information and requesting a return call.   Abelino Derrick, MHA [facility]  Managed [facility] Social Worker [phone]

## 2023-04-03 ENCOUNTER — Other Ambulatory Visit: Payer: Medicaid Other | Admitting: Licensed Clinical Social Worker

## 2023-04-03 ENCOUNTER — Inpatient Hospital Stay: Payer: 59 | Admitting: Critical Care Medicine

## 2023-04-03 NOTE — Patient Outreach (Addendum)
  Medicaid Managed Care   Unsuccessful Attempt Note   04/03/2023 Name: Stacey Lucas MRN: [id number] DOB: 10-Oct-1975  Referred by: Storm Frisk, MD Reason for referral : No chief complaint on file.   Third unsuccessful telephone outreach was attempted today. The patient was referred to the case management team for assistance with care management and care coordination. The patient's primary care provider has been notified of our unsuccessful attempts to make or maintain contact with the patient. The care management team is pleased to engage with this patient at any time in the future should he/she be interested in assistance from the care management team.    Follow Up Plan: A HIPAA compliant phone message was left for the patient providing contact information and requesting a return call. and The Managed Medicaid care management team is available to follow up with the patient after provider conversation with the patient regarding recommendation for care management engagement and subsequent re-referral to the care management team.    Dickie La, BSW, MSW, LCSW Managed Medicaid LCSW [facility]  [facility] [email or web address] Phone: [phone]

## 2023-04-03 NOTE — Progress Notes (Deleted)
Patient ID: Stacey Lucas, female   DOB: Aug 18, 1976, 47 y.o.   MRN: [id number]     Chante Mayson, is a 47 y.o. female    Post hospital follow-up Subjective:     Leanny Moeckel is a 48 y.o. female here today post hospital follow-up 12/03/22 Patient seen return follow-up and at the last visit patient had an acute stroke sent to the hospital for same.  Patient returns and needing outpatient physical therapy.  She had a pontine stroke.  Now in end-stage renal failure creatinine nearly 5.  She is followed by nephrology.  On arrival blood pressure 152/91.  She is on low-dose beta-blocker.  She still smoking 2 to 3 cigarettes daily.  She was using cocaine no longer doing so. Below is a copy of the discharge summary Name: Clarivel Callaway MRN: [id number] DOB: [birth year] 47 y.o. PCP: Storm Frisk, MD   Date of Admission: 11/14/2022 10:01 AM Date of Discharge: 11/21/2022 Attending Physician: Reymundo Poll, MD   Discharge Diagnosis: 1. Active Problems:   Acute CVA (cerebrovascular accident) (HCC)   Nephrotic syndrome   Cerebrovascular accident (CVA) (HCC)   AKI (acute kidney injury) (HCC) T2DM HTN Hyperlipidemia Asymptomatic pyuria Anemia of chronic disease  L pontine CVA -lipid panel 3 months, LDL goal <70 secondary prevention -apixaban, no ASA or plavix -BP and diabetes optimization -neurology f/u outpatient   HTN -titrate carvedilol as needed, no BP meds that have GFR restriction   T2DM -repeat A1c in 3 months -no meds at discharge, titrate as needed   Nephrotic syndrome -eliquis 2.5 BID -lasix 40 daily -outpatient follow up with nephro -repeat cmp -f/u biopsy -check volume status   Anemia of CKD -follow up CBC -EPA every Saturday per nephrology   Polysubstance use -work on cessation   2. Labs/tests needed at follow up: renal function panel,CBC   3.  Pending labs/ test needing follow-up: Kidney biopsy results from 3/13    Hospital Course by problem list:    Acute left pontine CVA On 11/14/22 she received CT head for code stroke w/o contrast which demonstrated no acute large vessel infarct, hemorrhage, hydrocephalus or mass effect. Follow up MRI brain w/o contrast showed small acute left pontine CVA. Repeat imaging MRI angio head w/o contrast on 11/15/22 were same as previous MRI. She had dysmetria, dysarthria and new onset right side weakness that improved during her hospital stay with acute PT intervention. Will go home with [facility] PT/OT. No DAPT given nephrotic syndrome. On Heparin ggt during admission transitioned to apixaban, ok per neurology. Transitioned from crestor 20 to atorvastatin 80 during admission, LDL found to be 99 above goal of <70 for secondary prevention. BP well controlled during admission. Diabetes well controlled during admission.Will follow up with neurology in the outpatient setting.   Nephrotic Syndrome CKD During her inpatient stay, patient had GFR ranging from 10-12, c/f AKI on CKD vs CKD V. On day 1, she had edema in her face and lower extremity. Per patient, she had been experiencing this swelling for a few weeks and it was increasingly harder to fit her shoes. Renal US with evidence of medically renal disease. Marland Kitchen She was worked up for nephrotic syndrome after having a serum protein gap with albumin <1.5. 24 hour protein >6 g. ANA,C3,C4,ANCA, myeoloma panel all unrevealing. She was started on lasix 40mg  and diuresed well. She appears euvolemicOn 3/13 she underwent a kidney biopsy to determine the etiology of her kidney dysfunction which is still pending, will be followed up by nephrology.Marland Kitchen  She has minimal soreness on the day post-op, no signs of swelling at the site, blood in urine or other complications.    Microcytic anemia Hgb of 9.8, MCV of 76.2 at admission.Ferritin wnl, iron wnl, TIBC low at 133 consistent with anemia of chronic disease.Hemoglobin stable at discharge at 8. On EPA every Saturday per nephrology.   HTN Patient was  hypertensive on her amlodipine 10mg  home dose while inpatient. She was started on coreg 3.125 mg BID which resulted in adequate control of her blood pressure during most of admission. She was counseled on the importance of taking both medications to mitigate impact to her renal dysfunction.    Chest Pain On 11/16/22 patient reported some intermittent chest pain that radiated to her neck. DVT and PE studies were negative. May have been uremic pericarditis. Pain resolved during admission.   T2DM Patient's H1ac was 5.6 during admission. She was managed with SSI while inpatient and received almost no short acting. Fasting glucose primarily <120.Patient was not taking glargine or glimepiride at home, discontinued at discharge.   Asymptomatic pyuria Patient denies suprapubic pain, dysuria, or any other urinary symptoms. UA with pyuria and bacteruria. With her being asymptotic do not believe etiology of her leukocytosis or renal dysfunction   Polysubstance use Endorses snorting cocaine weekly. She became tearful when discussing this. We discussed how this can lead to further strokes and even heart attacks.Encouraged cessation.TOC consulted.   Depression Continued home celexa and wellbutrin.   Leukocytosis On presentation WBC of 16.0, predominant left shift. Denies fever, chills, cough, shortness of breath or urinary symptoms. Xray without focal opacity. Unclear etiology of this. WBC 10.2 at discharge,afebrile, HDS, no evidence of infection throughout admission.    02/04/23 Patient seen in return follow-up and having right-sided muscle spasm involving the right arm and lower leg with associated pain.  She never got physical therapy as an outpatient.  She is smoking 2 cigarettes daily.  She has had no falls at this time.  Blood pressure is elevated on arrival 189/84.  She has followed with nephrology and is about to undergo end-stage renal disease hemodialysis.  She is having catheter access placed soon.   Records from nephrology show she has evidence of hyperparathyroidism assessment and treatment per nephrology The patient needs a rollator because she has difficulty navigating with a stationary walker also needs a shower stool because she cannot stand up to give her self baths Patient drags her right foot and would benefit from additional support of the right foot  04/03/23 Adm recently HFU  Date of Admission: 03/13/2023  2:41 PM Date of Discharge: 03/19/2023 4:32 PM Attending Physician: Dr. Criselda Peaches   Discharge Diagnosis: Principal Problem:   ESRD on dialysis [facility]) Active Problems:   Essential hypertension   Atrial fibrillation (HCC)   Anemia     Disposition and follow-up:   Ms.Zanayah Herrig was discharged from [facility] in Good condition.  At the hospital follow up visit please address:   1.  Follow-up:   ESRD - Patient on outpatient schedule. Started on phosphate binder sevelamer carbonate 800 mg TID. Experienced severe pain in L arm at fistula site that improved over time. Initially required IV dilaudid, discharged with Tylenol 1000 mg q8h for 5 days as needed. Can continue regular follow-up of creatinine and overall kidney function.    Microcytic anemia  - Hgb low but stable ranging 7.2-7.7, with Hgb 7.2 at time of discharge with no symptoms of weakness, lightheadedness, or confusion. Will need  CBC.      Atrial fibrillation  - Resumed apixaban 2.5 mg BID during admission, held briefly for 7/8 fistula by vascular surgery and promptly resumed. Nothing to follow-up other than patient taking regularly.      Chronic hypertension  - Patient started on home medications including amlodipine and carvedilol. Blood pressures ranged widely throughout the stay, including 163/64, 159/75, 163/73. Blood pressure this morning was 115/94. Home dose carvedilol 3.25 mg BID was given as 6.50 mg BID during admission. Need to address dosing of carvedilol with patient at follow-up  visit.   2.  Labs / imaging needed at time of follow-up: CBC, RFP, CBGs   3.  Pending labs/ test needing follow-up: none from inpatient   4.  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 12/29/2021   Anxiety and depression    Cerebral thrombosis with cerebral infarction 05/24/2021   Cocaine abuse (HCC)    Depression with suicidal ideation    [facility] admission 04/2018   Diabetes mellitus without complication (HCC)    Type II   End stage kidney disease (HCC)    GERD (gastroesophageal reflux disease)    Hyperlipidemia    Hypertension    Impingement syndrome of right shoulder    Migraine headache    Osteoarthritis of right knee 05/24/2021   Pilonidal abscess 05/2013   Pyelonephritis    Right thyroid nodule    Sciatica     Objective:   There were no vitals filed for this visit.   Data Review Lab Results  Component Value Date   HGBA1C 5.6 11/14/2022   HGBA1C 5.6 11/14/2022   HGBA1C 7.7 (H) 03/05/2022    Assessment & Plan  I personally reviewed all images and lab data in the [facility] system as well as any outside material available during this office visit and agree with the  radiology impressions.   No problem-specific Assessment & Plan notes found for this encounter.    There are no diagnoses linked to this encounter.    No follow-ups on file.

## 2023-04-03 NOTE — Patient Instructions (Signed)
[name] ,   The [facility] Managed Care Team is available to provide assistance to you with your healthcare needs at no cost and as a benefit of your [facility] plan. We have been unable to reach you on 3 separate attempts. The care management team is available to assist with your healthcare needs at any time. Please do not hesitate to contact me at the number below. .   Thank you,   Dickie La, BSW, MSW, LCSW Managed Medicaid LCSW [facility]  [address] [email or web address] Phone: [phone]

## 2023-04-04 DIAGNOSIS — Z418 Encounter for other procedures for purposes other than remedying health state: Secondary | ICD-10-CM | POA: Diagnosis not present

## 2023-04-04 DIAGNOSIS — D509 Iron deficiency anemia, unspecified: Secondary | ICD-10-CM | POA: Diagnosis not present

## 2023-04-04 DIAGNOSIS — N186 End stage renal disease: Secondary | ICD-10-CM | POA: Diagnosis not present

## 2023-04-04 DIAGNOSIS — N2581 Secondary hyperparathyroidism of renal origin: Secondary | ICD-10-CM | POA: Diagnosis not present

## 2023-04-04 DIAGNOSIS — D631 Anemia in chronic kidney disease: Secondary | ICD-10-CM | POA: Diagnosis not present

## 2023-04-11 DIAGNOSIS — D509 Iron deficiency anemia, unspecified: Secondary | ICD-10-CM | POA: Diagnosis not present

## 2023-04-11 DIAGNOSIS — N2581 Secondary hyperparathyroidism of renal origin: Secondary | ICD-10-CM | POA: Diagnosis not present

## 2023-04-11 DIAGNOSIS — N186 End stage renal disease: Secondary | ICD-10-CM | POA: Diagnosis not present

## 2023-04-11 DIAGNOSIS — Z418 Encounter for other procedures for purposes other than remedying health state: Secondary | ICD-10-CM | POA: Diagnosis not present

## 2023-04-11 DIAGNOSIS — D631 Anemia in chronic kidney disease: Secondary | ICD-10-CM | POA: Diagnosis not present

## 2023-04-14 DIAGNOSIS — D631 Anemia in chronic kidney disease: Secondary | ICD-10-CM | POA: Diagnosis not present

## 2023-04-14 DIAGNOSIS — N2581 Secondary hyperparathyroidism of renal origin: Secondary | ICD-10-CM | POA: Diagnosis not present

## 2023-04-14 DIAGNOSIS — N186 End stage renal disease: Secondary | ICD-10-CM | POA: Diagnosis not present

## 2023-04-14 DIAGNOSIS — D509 Iron deficiency anemia, unspecified: Secondary | ICD-10-CM | POA: Diagnosis not present

## 2023-04-14 DIAGNOSIS — Z418 Encounter for other procedures for purposes other than remedying health state: Secondary | ICD-10-CM | POA: Diagnosis not present

## 2023-04-16 DIAGNOSIS — Z418 Encounter for other procedures for purposes other than remedying health state: Secondary | ICD-10-CM | POA: Diagnosis not present

## 2023-04-16 DIAGNOSIS — N2581 Secondary hyperparathyroidism of renal origin: Secondary | ICD-10-CM | POA: Diagnosis not present

## 2023-04-16 DIAGNOSIS — N186 End stage renal disease: Secondary | ICD-10-CM | POA: Diagnosis not present

## 2023-04-16 DIAGNOSIS — D631 Anemia in chronic kidney disease: Secondary | ICD-10-CM | POA: Diagnosis not present

## 2023-04-16 DIAGNOSIS — D509 Iron deficiency anemia, unspecified: Secondary | ICD-10-CM | POA: Diagnosis not present

## 2023-04-18 DIAGNOSIS — F1120 Opioid dependence, uncomplicated: Secondary | ICD-10-CM | POA: Diagnosis not present

## 2023-04-19 DIAGNOSIS — N2581 Secondary hyperparathyroidism of renal origin: Secondary | ICD-10-CM | POA: Diagnosis not present

## 2023-04-19 DIAGNOSIS — N186 End stage renal disease: Secondary | ICD-10-CM | POA: Diagnosis not present

## 2023-04-19 DIAGNOSIS — Z418 Encounter for other procedures for purposes other than remedying health state: Secondary | ICD-10-CM | POA: Diagnosis not present

## 2023-04-19 DIAGNOSIS — D509 Iron deficiency anemia, unspecified: Secondary | ICD-10-CM | POA: Diagnosis not present

## 2023-04-19 DIAGNOSIS — D631 Anemia in chronic kidney disease: Secondary | ICD-10-CM | POA: Diagnosis not present

## 2023-04-21 DIAGNOSIS — Z418 Encounter for other procedures for purposes other than remedying health state: Secondary | ICD-10-CM | POA: Diagnosis not present

## 2023-04-21 DIAGNOSIS — N186 End stage renal disease: Secondary | ICD-10-CM | POA: Diagnosis not present

## 2023-04-21 DIAGNOSIS — N2581 Secondary hyperparathyroidism of renal origin: Secondary | ICD-10-CM | POA: Diagnosis not present

## 2023-04-21 DIAGNOSIS — D509 Iron deficiency anemia, unspecified: Secondary | ICD-10-CM | POA: Diagnosis not present

## 2023-04-21 DIAGNOSIS — D631 Anemia in chronic kidney disease: Secondary | ICD-10-CM | POA: Diagnosis not present

## 2023-04-23 DIAGNOSIS — D509 Iron deficiency anemia, unspecified: Secondary | ICD-10-CM | POA: Diagnosis not present

## 2023-04-23 DIAGNOSIS — N186 End stage renal disease: Secondary | ICD-10-CM | POA: Diagnosis not present

## 2023-04-23 DIAGNOSIS — Z418 Encounter for other procedures for purposes other than remedying health state: Secondary | ICD-10-CM | POA: Diagnosis not present

## 2023-04-23 DIAGNOSIS — N2581 Secondary hyperparathyroidism of renal origin: Secondary | ICD-10-CM | POA: Diagnosis not present

## 2023-04-23 DIAGNOSIS — D631 Anemia in chronic kidney disease: Secondary | ICD-10-CM | POA: Diagnosis not present

## 2023-04-24 ENCOUNTER — Other Ambulatory Visit: Payer: 59 | Admitting: *Deleted

## 2023-04-24 NOTE — Patient Outreach (Signed)
  Medicaid Managed Care   Unsuccessful Attempt Note   04/24/2023 Name: Stacey Lucas MRN: [id number] DOB: 1976-01-11  Referred by: Storm Frisk, MD Reason for referral : Case Closure (RNCM performing Case Closure)    Three unsuccessful telephone outreach attempts have been made. The patient was referred to the case management team for assistance with care management and care coordination. The patient's primary care provider has been notified of our unsuccessful attempts to make or maintain contact with the patient. The care management team is pleased to engage with this patient at any time in the future should he/she be interested in assistance from the care management team.   Follow Up Plan: We have been unable to make contact with the patient for follow up. The care management team is available to follow up with the patient after provider conversation with the patient regarding recommendation for care management engagement and subsequent re-referral to the care management team.   Estanislado Emms RN, BSN [place]  Managed [facility] RN Care Coordinator [phone]

## 2023-04-25 DIAGNOSIS — N2581 Secondary hyperparathyroidism of renal origin: Secondary | ICD-10-CM | POA: Diagnosis not present

## 2023-04-25 DIAGNOSIS — Z418 Encounter for other procedures for purposes other than remedying health state: Secondary | ICD-10-CM | POA: Diagnosis not present

## 2023-04-25 DIAGNOSIS — D509 Iron deficiency anemia, unspecified: Secondary | ICD-10-CM | POA: Diagnosis not present

## 2023-04-25 DIAGNOSIS — D631 Anemia in chronic kidney disease: Secondary | ICD-10-CM | POA: Diagnosis not present

## 2023-04-25 DIAGNOSIS — N186 End stage renal disease: Secondary | ICD-10-CM | POA: Diagnosis not present

## 2023-04-28 DIAGNOSIS — D631 Anemia in chronic kidney disease: Secondary | ICD-10-CM | POA: Diagnosis not present

## 2023-04-28 DIAGNOSIS — N186 End stage renal disease: Secondary | ICD-10-CM | POA: Diagnosis not present

## 2023-04-28 DIAGNOSIS — Z418 Encounter for other procedures for purposes other than remedying health state: Secondary | ICD-10-CM | POA: Diagnosis not present

## 2023-04-28 DIAGNOSIS — D509 Iron deficiency anemia, unspecified: Secondary | ICD-10-CM | POA: Diagnosis not present

## 2023-04-28 DIAGNOSIS — N2581 Secondary hyperparathyroidism of renal origin: Secondary | ICD-10-CM | POA: Diagnosis not present

## 2023-04-30 DIAGNOSIS — N2581 Secondary hyperparathyroidism of renal origin: Secondary | ICD-10-CM | POA: Diagnosis not present

## 2023-04-30 DIAGNOSIS — D631 Anemia in chronic kidney disease: Secondary | ICD-10-CM | POA: Diagnosis not present

## 2023-04-30 DIAGNOSIS — Z418 Encounter for other procedures for purposes other than remedying health state: Secondary | ICD-10-CM | POA: Diagnosis not present

## 2023-04-30 DIAGNOSIS — N186 End stage renal disease: Secondary | ICD-10-CM | POA: Diagnosis not present

## 2023-04-30 DIAGNOSIS — D509 Iron deficiency anemia, unspecified: Secondary | ICD-10-CM | POA: Diagnosis not present

## 2023-05-01 ENCOUNTER — Other Ambulatory Visit: Payer: Self-pay

## 2023-05-01 DIAGNOSIS — N185 Chronic kidney disease, stage 5: Secondary | ICD-10-CM

## 2023-05-02 DIAGNOSIS — D509 Iron deficiency anemia, unspecified: Secondary | ICD-10-CM | POA: Diagnosis not present

## 2023-05-02 DIAGNOSIS — Z418 Encounter for other procedures for purposes other than remedying health state: Secondary | ICD-10-CM | POA: Diagnosis not present

## 2023-05-02 DIAGNOSIS — D631 Anemia in chronic kidney disease: Secondary | ICD-10-CM | POA: Diagnosis not present

## 2023-05-02 DIAGNOSIS — N186 End stage renal disease: Secondary | ICD-10-CM | POA: Diagnosis not present

## 2023-05-02 DIAGNOSIS — N2581 Secondary hyperparathyroidism of renal origin: Secondary | ICD-10-CM | POA: Diagnosis not present

## 2023-05-07 DIAGNOSIS — N186 End stage renal disease: Secondary | ICD-10-CM | POA: Diagnosis not present

## 2023-05-07 DIAGNOSIS — N2581 Secondary hyperparathyroidism of renal origin: Secondary | ICD-10-CM | POA: Diagnosis not present

## 2023-05-07 DIAGNOSIS — D631 Anemia in chronic kidney disease: Secondary | ICD-10-CM | POA: Diagnosis not present

## 2023-05-07 DIAGNOSIS — D509 Iron deficiency anemia, unspecified: Secondary | ICD-10-CM | POA: Diagnosis not present

## 2023-05-07 DIAGNOSIS — Z418 Encounter for other procedures for purposes other than remedying health state: Secondary | ICD-10-CM | POA: Diagnosis not present

## 2023-05-08 ENCOUNTER — Inpatient Hospital Stay: Payer: 59 | Admitting: Physician Assistant

## 2023-05-08 NOTE — Progress Notes (Deleted)
Patient ID: Stacey Lucas, female   DOB: 08-07-1976, 47 y.o.   MRN: [id number]   After hospitalization 7/4-7/06/2023   Discharge Diagnosis: Principal Problem:   ESRD on dialysis [facility]) Active Problems:   Essential hypertension   Atrial fibrillation (HCC)   Anemia   ESRD - Patient on outpatient schedule. Started on phosphate binder sevelamer carbonate 800 mg TID. Experienced severe pain in L arm at fistula site that improved over time. Initially required IV dilaudid, discharged with Tylenol 1000 mg q8h for 5 days as needed. Can continue regular follow-up of creatinine and overall kidney function.    Microcytic anemia  - Hgb low but stable ranging 7.2-7.7, with Hgb 7.2 at time of discharge with no symptoms of weakness, lightheadedness, or confusion. Will need CBC.      Atrial fibrillation  - Resumed apixaban 2.5 mg BID during admission, held briefly for 7/8 fistula by vascular surgery and promptly resumed. Nothing to follow-up other than patient taking regularly.      Chronic hypertension  - Patient started on home medications including amlodipine and carvedilol. Blood pressures ranged widely throughout the stay, including 163/64, 159/75, 163/73. Blood pressure this morning was 115/94. Home dose carvedilol 3.25 mg BID was given as 6.50 mg BID during admission. Need to address dosing of carvedilol with patient at follow-up visit.   2.  Labs / imaging needed at time of follow-up: CBC, RFP, CBGs   3.  Pending labs/ test needing follow-up: none from inpatient   4.  Medication Changes             STOPPED             - n/a               ADDED             - Atorvastatin 80 mg             - Sevelamer carbonate 800 mg TID  - Tylenol 1000 mg q8h for 5 days                MODIFIED             - Carvedilol 6.25 mg BID

## 2023-05-09 DIAGNOSIS — Z418 Encounter for other procedures for purposes other than remedying health state: Secondary | ICD-10-CM | POA: Diagnosis not present

## 2023-05-09 DIAGNOSIS — D631 Anemia in chronic kidney disease: Secondary | ICD-10-CM | POA: Diagnosis not present

## 2023-05-09 DIAGNOSIS — N186 End stage renal disease: Secondary | ICD-10-CM | POA: Diagnosis not present

## 2023-05-09 DIAGNOSIS — N2581 Secondary hyperparathyroidism of renal origin: Secondary | ICD-10-CM | POA: Diagnosis not present

## 2023-05-09 DIAGNOSIS — D509 Iron deficiency anemia, unspecified: Secondary | ICD-10-CM | POA: Diagnosis not present

## 2023-05-12 DIAGNOSIS — N2581 Secondary hyperparathyroidism of renal origin: Secondary | ICD-10-CM | POA: Diagnosis not present

## 2023-05-12 DIAGNOSIS — Z418 Encounter for other procedures for purposes other than remedying health state: Secondary | ICD-10-CM | POA: Diagnosis not present

## 2023-05-12 DIAGNOSIS — D509 Iron deficiency anemia, unspecified: Secondary | ICD-10-CM | POA: Diagnosis not present

## 2023-05-12 DIAGNOSIS — D631 Anemia in chronic kidney disease: Secondary | ICD-10-CM | POA: Diagnosis not present

## 2023-05-12 DIAGNOSIS — N186 End stage renal disease: Secondary | ICD-10-CM | POA: Diagnosis not present

## 2023-05-12 DIAGNOSIS — Z23 Encounter for immunization: Secondary | ICD-10-CM | POA: Diagnosis not present

## 2023-05-13 ENCOUNTER — Ambulatory Visit (HOSPITAL_COMMUNITY): Payer: 59 | Attending: Vascular Surgery

## 2023-05-14 DIAGNOSIS — D631 Anemia in chronic kidney disease: Secondary | ICD-10-CM | POA: Diagnosis not present

## 2023-05-14 DIAGNOSIS — N2581 Secondary hyperparathyroidism of renal origin: Secondary | ICD-10-CM | POA: Diagnosis not present

## 2023-05-14 DIAGNOSIS — Z418 Encounter for other procedures for purposes other than remedying health state: Secondary | ICD-10-CM | POA: Diagnosis not present

## 2023-05-14 DIAGNOSIS — N186 End stage renal disease: Secondary | ICD-10-CM | POA: Diagnosis not present

## 2023-05-14 DIAGNOSIS — Z23 Encounter for immunization: Secondary | ICD-10-CM | POA: Diagnosis not present

## 2023-05-14 DIAGNOSIS — D509 Iron deficiency anemia, unspecified: Secondary | ICD-10-CM | POA: Diagnosis not present

## 2023-05-16 DIAGNOSIS — D631 Anemia in chronic kidney disease: Secondary | ICD-10-CM | POA: Diagnosis not present

## 2023-05-16 DIAGNOSIS — D509 Iron deficiency anemia, unspecified: Secondary | ICD-10-CM | POA: Diagnosis not present

## 2023-05-16 DIAGNOSIS — Z23 Encounter for immunization: Secondary | ICD-10-CM | POA: Diagnosis not present

## 2023-05-16 DIAGNOSIS — N186 End stage renal disease: Secondary | ICD-10-CM | POA: Diagnosis not present

## 2023-05-16 DIAGNOSIS — Z418 Encounter for other procedures for purposes other than remedying health state: Secondary | ICD-10-CM | POA: Diagnosis not present

## 2023-05-16 DIAGNOSIS — N2581 Secondary hyperparathyroidism of renal origin: Secondary | ICD-10-CM | POA: Diagnosis not present

## 2023-05-19 DIAGNOSIS — N2581 Secondary hyperparathyroidism of renal origin: Secondary | ICD-10-CM | POA: Diagnosis not present

## 2023-05-19 DIAGNOSIS — D509 Iron deficiency anemia, unspecified: Secondary | ICD-10-CM | POA: Diagnosis not present

## 2023-05-19 DIAGNOSIS — Z23 Encounter for immunization: Secondary | ICD-10-CM | POA: Diagnosis not present

## 2023-05-19 DIAGNOSIS — D631 Anemia in chronic kidney disease: Secondary | ICD-10-CM | POA: Diagnosis not present

## 2023-05-19 DIAGNOSIS — Z418 Encounter for other procedures for purposes other than remedying health state: Secondary | ICD-10-CM | POA: Diagnosis not present

## 2023-05-19 DIAGNOSIS — N186 End stage renal disease: Secondary | ICD-10-CM | POA: Diagnosis not present

## 2023-05-20 ENCOUNTER — Encounter: Payer: Self-pay | Admitting: Critical Care Medicine

## 2023-05-21 DIAGNOSIS — D631 Anemia in chronic kidney disease: Secondary | ICD-10-CM | POA: Diagnosis not present

## 2023-05-21 DIAGNOSIS — Z418 Encounter for other procedures for purposes other than remedying health state: Secondary | ICD-10-CM | POA: Diagnosis not present

## 2023-05-21 DIAGNOSIS — Z23 Encounter for immunization: Secondary | ICD-10-CM | POA: Diagnosis not present

## 2023-05-21 DIAGNOSIS — D509 Iron deficiency anemia, unspecified: Secondary | ICD-10-CM | POA: Diagnosis not present

## 2023-05-21 DIAGNOSIS — N2581 Secondary hyperparathyroidism of renal origin: Secondary | ICD-10-CM | POA: Diagnosis not present

## 2023-05-21 DIAGNOSIS — N186 End stage renal disease: Secondary | ICD-10-CM | POA: Diagnosis not present

## 2023-05-23 DIAGNOSIS — Z418 Encounter for other procedures for purposes other than remedying health state: Secondary | ICD-10-CM | POA: Diagnosis not present

## 2023-05-23 DIAGNOSIS — N2581 Secondary hyperparathyroidism of renal origin: Secondary | ICD-10-CM | POA: Diagnosis not present

## 2023-05-23 DIAGNOSIS — D509 Iron deficiency anemia, unspecified: Secondary | ICD-10-CM | POA: Diagnosis not present

## 2023-05-23 DIAGNOSIS — N186 End stage renal disease: Secondary | ICD-10-CM | POA: Diagnosis not present

## 2023-05-23 DIAGNOSIS — D631 Anemia in chronic kidney disease: Secondary | ICD-10-CM | POA: Diagnosis not present

## 2023-05-23 DIAGNOSIS — Z23 Encounter for immunization: Secondary | ICD-10-CM | POA: Diagnosis not present

## 2023-05-26 DIAGNOSIS — N2581 Secondary hyperparathyroidism of renal origin: Secondary | ICD-10-CM | POA: Diagnosis not present

## 2023-05-26 DIAGNOSIS — Z23 Encounter for immunization: Secondary | ICD-10-CM | POA: Diagnosis not present

## 2023-05-26 DIAGNOSIS — Z418 Encounter for other procedures for purposes other than remedying health state: Secondary | ICD-10-CM | POA: Diagnosis not present

## 2023-05-26 DIAGNOSIS — D631 Anemia in chronic kidney disease: Secondary | ICD-10-CM | POA: Diagnosis not present

## 2023-05-26 DIAGNOSIS — N186 End stage renal disease: Secondary | ICD-10-CM | POA: Diagnosis not present

## 2023-05-26 DIAGNOSIS — D509 Iron deficiency anemia, unspecified: Secondary | ICD-10-CM | POA: Diagnosis not present

## 2023-05-28 DIAGNOSIS — N186 End stage renal disease: Secondary | ICD-10-CM | POA: Diagnosis not present

## 2023-05-28 DIAGNOSIS — Z418 Encounter for other procedures for purposes other than remedying health state: Secondary | ICD-10-CM | POA: Diagnosis not present

## 2023-05-28 DIAGNOSIS — D509 Iron deficiency anemia, unspecified: Secondary | ICD-10-CM | POA: Diagnosis not present

## 2023-05-28 DIAGNOSIS — N2581 Secondary hyperparathyroidism of renal origin: Secondary | ICD-10-CM | POA: Diagnosis not present

## 2023-05-28 DIAGNOSIS — D631 Anemia in chronic kidney disease: Secondary | ICD-10-CM | POA: Diagnosis not present

## 2023-05-28 DIAGNOSIS — Z23 Encounter for immunization: Secondary | ICD-10-CM | POA: Diagnosis not present

## 2023-05-30 DIAGNOSIS — D631 Anemia in chronic kidney disease: Secondary | ICD-10-CM | POA: Diagnosis not present

## 2023-05-30 DIAGNOSIS — Z418 Encounter for other procedures for purposes other than remedying health state: Secondary | ICD-10-CM | POA: Diagnosis not present

## 2023-05-30 DIAGNOSIS — N2581 Secondary hyperparathyroidism of renal origin: Secondary | ICD-10-CM | POA: Diagnosis not present

## 2023-05-30 DIAGNOSIS — N186 End stage renal disease: Secondary | ICD-10-CM | POA: Diagnosis not present

## 2023-05-30 DIAGNOSIS — D509 Iron deficiency anemia, unspecified: Secondary | ICD-10-CM | POA: Diagnosis not present

## 2023-05-30 DIAGNOSIS — Z23 Encounter for immunization: Secondary | ICD-10-CM | POA: Diagnosis not present

## 2023-06-02 DIAGNOSIS — Z23 Encounter for immunization: Secondary | ICD-10-CM | POA: Diagnosis not present

## 2023-06-02 DIAGNOSIS — D509 Iron deficiency anemia, unspecified: Secondary | ICD-10-CM | POA: Diagnosis not present

## 2023-06-02 DIAGNOSIS — N186 End stage renal disease: Secondary | ICD-10-CM | POA: Diagnosis not present

## 2023-06-02 DIAGNOSIS — N2581 Secondary hyperparathyroidism of renal origin: Secondary | ICD-10-CM | POA: Diagnosis not present

## 2023-06-02 DIAGNOSIS — D631 Anemia in chronic kidney disease: Secondary | ICD-10-CM | POA: Diagnosis not present

## 2023-06-02 DIAGNOSIS — Z418 Encounter for other procedures for purposes other than remedying health state: Secondary | ICD-10-CM | POA: Diagnosis not present

## 2023-06-04 DIAGNOSIS — Z23 Encounter for immunization: Secondary | ICD-10-CM | POA: Diagnosis not present

## 2023-06-04 DIAGNOSIS — N2581 Secondary hyperparathyroidism of renal origin: Secondary | ICD-10-CM | POA: Diagnosis not present

## 2023-06-04 DIAGNOSIS — N186 End stage renal disease: Secondary | ICD-10-CM | POA: Diagnosis not present

## 2023-06-04 DIAGNOSIS — D509 Iron deficiency anemia, unspecified: Secondary | ICD-10-CM | POA: Diagnosis not present

## 2023-06-04 DIAGNOSIS — D631 Anemia in chronic kidney disease: Secondary | ICD-10-CM | POA: Diagnosis not present

## 2023-06-04 DIAGNOSIS — Z418 Encounter for other procedures for purposes other than remedying health state: Secondary | ICD-10-CM | POA: Diagnosis not present

## 2023-06-06 DIAGNOSIS — N186 End stage renal disease: Secondary | ICD-10-CM | POA: Diagnosis not present

## 2023-06-06 DIAGNOSIS — D631 Anemia in chronic kidney disease: Secondary | ICD-10-CM | POA: Diagnosis not present

## 2023-06-06 DIAGNOSIS — N2581 Secondary hyperparathyroidism of renal origin: Secondary | ICD-10-CM | POA: Diagnosis not present

## 2023-06-06 DIAGNOSIS — D509 Iron deficiency anemia, unspecified: Secondary | ICD-10-CM | POA: Diagnosis not present

## 2023-06-06 DIAGNOSIS — Z23 Encounter for immunization: Secondary | ICD-10-CM | POA: Diagnosis not present

## 2023-06-06 DIAGNOSIS — Z418 Encounter for other procedures for purposes other than remedying health state: Secondary | ICD-10-CM | POA: Diagnosis not present

## 2023-06-09 DIAGNOSIS — Z23 Encounter for immunization: Secondary | ICD-10-CM | POA: Diagnosis not present

## 2023-06-09 DIAGNOSIS — D631 Anemia in chronic kidney disease: Secondary | ICD-10-CM | POA: Diagnosis not present

## 2023-06-09 DIAGNOSIS — Z418 Encounter for other procedures for purposes other than remedying health state: Secondary | ICD-10-CM | POA: Diagnosis not present

## 2023-06-09 DIAGNOSIS — N2581 Secondary hyperparathyroidism of renal origin: Secondary | ICD-10-CM | POA: Diagnosis not present

## 2023-06-09 DIAGNOSIS — N186 End stage renal disease: Secondary | ICD-10-CM | POA: Diagnosis not present

## 2023-06-09 DIAGNOSIS — D509 Iron deficiency anemia, unspecified: Secondary | ICD-10-CM | POA: Diagnosis not present

## 2023-06-11 DIAGNOSIS — D509 Iron deficiency anemia, unspecified: Secondary | ICD-10-CM | POA: Diagnosis not present

## 2023-06-11 DIAGNOSIS — N186 End stage renal disease: Secondary | ICD-10-CM | POA: Diagnosis not present

## 2023-06-11 DIAGNOSIS — D631 Anemia in chronic kidney disease: Secondary | ICD-10-CM | POA: Diagnosis not present

## 2023-06-11 DIAGNOSIS — N2581 Secondary hyperparathyroidism of renal origin: Secondary | ICD-10-CM | POA: Diagnosis not present

## 2023-06-11 DIAGNOSIS — Z418 Encounter for other procedures for purposes other than remedying health state: Secondary | ICD-10-CM | POA: Diagnosis not present

## 2023-06-13 DIAGNOSIS — N186 End stage renal disease: Secondary | ICD-10-CM | POA: Diagnosis not present

## 2023-06-13 DIAGNOSIS — N2581 Secondary hyperparathyroidism of renal origin: Secondary | ICD-10-CM | POA: Diagnosis not present

## 2023-06-13 DIAGNOSIS — Z418 Encounter for other procedures for purposes other than remedying health state: Secondary | ICD-10-CM | POA: Diagnosis not present

## 2023-06-13 DIAGNOSIS — D509 Iron deficiency anemia, unspecified: Secondary | ICD-10-CM | POA: Diagnosis not present

## 2023-06-13 DIAGNOSIS — D631 Anemia in chronic kidney disease: Secondary | ICD-10-CM | POA: Diagnosis not present

## 2023-06-16 DIAGNOSIS — N186 End stage renal disease: Secondary | ICD-10-CM | POA: Diagnosis not present

## 2023-06-16 DIAGNOSIS — D509 Iron deficiency anemia, unspecified: Secondary | ICD-10-CM | POA: Diagnosis not present

## 2023-06-16 DIAGNOSIS — Z418 Encounter for other procedures for purposes other than remedying health state: Secondary | ICD-10-CM | POA: Diagnosis not present

## 2023-06-16 DIAGNOSIS — N2581 Secondary hyperparathyroidism of renal origin: Secondary | ICD-10-CM | POA: Diagnosis not present

## 2023-06-16 DIAGNOSIS — D631 Anemia in chronic kidney disease: Secondary | ICD-10-CM | POA: Diagnosis not present

## 2023-06-18 DIAGNOSIS — Z418 Encounter for other procedures for purposes other than remedying health state: Secondary | ICD-10-CM | POA: Diagnosis not present

## 2023-06-18 DIAGNOSIS — D509 Iron deficiency anemia, unspecified: Secondary | ICD-10-CM | POA: Diagnosis not present

## 2023-06-18 DIAGNOSIS — N186 End stage renal disease: Secondary | ICD-10-CM | POA: Diagnosis not present

## 2023-06-18 DIAGNOSIS — N2581 Secondary hyperparathyroidism of renal origin: Secondary | ICD-10-CM | POA: Diagnosis not present

## 2023-06-18 DIAGNOSIS — D631 Anemia in chronic kidney disease: Secondary | ICD-10-CM | POA: Diagnosis not present

## 2023-06-20 DIAGNOSIS — D631 Anemia in chronic kidney disease: Secondary | ICD-10-CM | POA: Diagnosis not present

## 2023-06-20 DIAGNOSIS — N186 End stage renal disease: Secondary | ICD-10-CM | POA: Diagnosis not present

## 2023-06-20 DIAGNOSIS — Z418 Encounter for other procedures for purposes other than remedying health state: Secondary | ICD-10-CM | POA: Diagnosis not present

## 2023-06-23 DIAGNOSIS — N186 End stage renal disease: Secondary | ICD-10-CM | POA: Diagnosis not present

## 2023-06-23 DIAGNOSIS — D631 Anemia in chronic kidney disease: Secondary | ICD-10-CM | POA: Diagnosis not present

## 2023-06-23 DIAGNOSIS — Z418 Encounter for other procedures for purposes other than remedying health state: Secondary | ICD-10-CM | POA: Diagnosis not present

## 2023-06-25 DIAGNOSIS — N186 End stage renal disease: Secondary | ICD-10-CM | POA: Diagnosis not present

## 2023-06-25 DIAGNOSIS — D631 Anemia in chronic kidney disease: Secondary | ICD-10-CM | POA: Diagnosis not present

## 2023-06-25 DIAGNOSIS — Z418 Encounter for other procedures for purposes other than remedying health state: Secondary | ICD-10-CM | POA: Diagnosis not present

## 2023-06-27 DIAGNOSIS — Z418 Encounter for other procedures for purposes other than remedying health state: Secondary | ICD-10-CM | POA: Diagnosis not present

## 2023-06-27 DIAGNOSIS — N186 End stage renal disease: Secondary | ICD-10-CM | POA: Diagnosis not present

## 2023-06-27 DIAGNOSIS — D631 Anemia in chronic kidney disease: Secondary | ICD-10-CM | POA: Diagnosis not present

## 2023-06-30 DIAGNOSIS — Z418 Encounter for other procedures for purposes other than remedying health state: Secondary | ICD-10-CM | POA: Diagnosis not present

## 2023-06-30 DIAGNOSIS — N186 End stage renal disease: Secondary | ICD-10-CM | POA: Diagnosis not present

## 2023-07-02 DIAGNOSIS — Z418 Encounter for other procedures for purposes other than remedying health state: Secondary | ICD-10-CM | POA: Diagnosis not present

## 2023-07-02 DIAGNOSIS — N186 End stage renal disease: Secondary | ICD-10-CM | POA: Diagnosis not present

## 2023-07-03 ENCOUNTER — Ambulatory Visit (HOSPITAL_COMMUNITY): Payer: 59 | Attending: Vascular Surgery

## 2023-07-04 DIAGNOSIS — N186 End stage renal disease: Secondary | ICD-10-CM | POA: Diagnosis not present

## 2023-07-04 DIAGNOSIS — Z418 Encounter for other procedures for purposes other than remedying health state: Secondary | ICD-10-CM | POA: Diagnosis not present

## 2023-07-06 DIAGNOSIS — R9431 Abnormal electrocardiogram [ECG] [EKG]: Secondary | ICD-10-CM | POA: Diagnosis not present

## 2023-07-06 DIAGNOSIS — R0902 Hypoxemia: Secondary | ICD-10-CM | POA: Diagnosis not present

## 2023-07-06 DIAGNOSIS — R000 Tachycardia, unspecified: Secondary | ICD-10-CM | POA: Diagnosis not present

## 2023-07-06 DIAGNOSIS — J810 Acute pulmonary edema: Secondary | ICD-10-CM | POA: Diagnosis not present

## 2023-07-06 DIAGNOSIS — I10 Essential (primary) hypertension: Secondary | ICD-10-CM | POA: Diagnosis not present

## 2023-07-06 DIAGNOSIS — J9601 Acute respiratory failure with hypoxia: Secondary | ICD-10-CM | POA: Diagnosis not present

## 2023-07-07 ENCOUNTER — Other Ambulatory Visit: Payer: Self-pay

## 2023-07-07 ENCOUNTER — Encounter (HOSPITAL_COMMUNITY): Payer: Self-pay

## 2023-07-07 ENCOUNTER — Emergency Department (HOSPITAL_COMMUNITY): Payer: 59

## 2023-07-07 ENCOUNTER — Inpatient Hospital Stay (HOSPITAL_COMMUNITY): Payer: 59

## 2023-07-07 ENCOUNTER — Inpatient Hospital Stay (HOSPITAL_COMMUNITY)
Admission: EM | Admit: 2023-07-07 | Discharge: 2023-07-08 | DRG: 280 | Disposition: A | Discharge status: Home / Self Care | Payer: 59 | Attending: Internal Medicine | Admitting: Internal Medicine

## 2023-07-07 ENCOUNTER — Other Ambulatory Visit (HOSPITAL_COMMUNITY): Payer: Self-pay

## 2023-07-07 DIAGNOSIS — I21A1 Myocardial infarction type 2: Secondary | ICD-10-CM | POA: Diagnosis not present

## 2023-07-07 DIAGNOSIS — I161 Hypertensive emergency: Secondary | ICD-10-CM | POA: Diagnosis not present

## 2023-07-07 DIAGNOSIS — F1491 Cocaine use, unspecified, in remission: Secondary | ICD-10-CM | POA: Diagnosis not present

## 2023-07-07 DIAGNOSIS — E1122 Type 2 diabetes mellitus with diabetic chronic kidney disease: Secondary | ICD-10-CM | POA: Diagnosis present

## 2023-07-07 DIAGNOSIS — R918 Other nonspecific abnormal finding of lung field: Secondary | ICD-10-CM | POA: Diagnosis not present

## 2023-07-07 DIAGNOSIS — F17210 Nicotine dependence, cigarettes, uncomplicated: Secondary | ICD-10-CM | POA: Diagnosis present

## 2023-07-07 DIAGNOSIS — R000 Tachycardia, unspecified: Secondary | ICD-10-CM | POA: Diagnosis present

## 2023-07-07 DIAGNOSIS — Z5982 Transportation insecurity: Secondary | ICD-10-CM

## 2023-07-07 DIAGNOSIS — Z992 Dependence on renal dialysis: Secondary | ICD-10-CM | POA: Diagnosis not present

## 2023-07-07 DIAGNOSIS — Z833 Family history of diabetes mellitus: Secondary | ICD-10-CM | POA: Diagnosis not present

## 2023-07-07 DIAGNOSIS — I4891 Unspecified atrial fibrillation: Secondary | ICD-10-CM | POA: Diagnosis present

## 2023-07-07 DIAGNOSIS — Z79899 Other long term (current) drug therapy: Secondary | ICD-10-CM

## 2023-07-07 DIAGNOSIS — Z8673 Personal history of transient ischemic attack (TIA), and cerebral infarction without residual deficits: Secondary | ICD-10-CM | POA: Diagnosis not present

## 2023-07-07 DIAGNOSIS — R0609 Other forms of dyspnea: Secondary | ICD-10-CM

## 2023-07-07 DIAGNOSIS — D631 Anemia in chronic kidney disease: Secondary | ICD-10-CM | POA: Diagnosis not present

## 2023-07-07 DIAGNOSIS — J9601 Acute respiratory failure with hypoxia: Secondary | ICD-10-CM | POA: Diagnosis not present

## 2023-07-07 DIAGNOSIS — N250 Renal osteodystrophy: Secondary | ICD-10-CM | POA: Diagnosis not present

## 2023-07-07 DIAGNOSIS — Z1152 Encounter for screening for COVID-19: Secondary | ICD-10-CM | POA: Diagnosis not present

## 2023-07-07 DIAGNOSIS — R601 Generalized edema: Secondary | ICD-10-CM | POA: Diagnosis not present

## 2023-07-07 DIAGNOSIS — Z66 Do not resuscitate: Secondary | ICD-10-CM | POA: Diagnosis present

## 2023-07-07 DIAGNOSIS — E8779 Other fluid overload: Secondary | ICD-10-CM | POA: Diagnosis not present

## 2023-07-07 DIAGNOSIS — R9431 Abnormal electrocardiogram [ECG] [EKG]: Secondary | ICD-10-CM | POA: Diagnosis not present

## 2023-07-07 DIAGNOSIS — Z8261 Family history of arthritis: Secondary | ICD-10-CM | POA: Diagnosis not present

## 2023-07-07 DIAGNOSIS — I5032 Chronic diastolic (congestive) heart failure: Secondary | ICD-10-CM | POA: Diagnosis not present

## 2023-07-07 DIAGNOSIS — I132 Hypertensive heart and chronic kidney disease with heart failure and with stage 5 chronic kidney disease, or end stage renal disease: Secondary | ICD-10-CM | POA: Diagnosis present

## 2023-07-07 DIAGNOSIS — Z8249 Family history of ischemic heart disease and other diseases of the circulatory system: Secondary | ICD-10-CM

## 2023-07-07 DIAGNOSIS — I3139 Other pericardial effusion (noninflammatory): Secondary | ICD-10-CM | POA: Diagnosis not present

## 2023-07-07 DIAGNOSIS — K219 Gastro-esophageal reflux disease without esophagitis: Secondary | ICD-10-CM | POA: Diagnosis present

## 2023-07-07 DIAGNOSIS — N186 End stage renal disease: Secondary | ICD-10-CM | POA: Diagnosis not present

## 2023-07-07 DIAGNOSIS — Z888 Allergy status to other drugs, medicaments and biological substances status: Secondary | ICD-10-CM

## 2023-07-07 DIAGNOSIS — J90 Pleural effusion, not elsewhere classified: Secondary | ICD-10-CM | POA: Diagnosis not present

## 2023-07-07 DIAGNOSIS — Z7901 Long term (current) use of anticoagulants: Secondary | ICD-10-CM | POA: Diagnosis not present

## 2023-07-07 DIAGNOSIS — E785 Hyperlipidemia, unspecified: Secondary | ICD-10-CM | POA: Diagnosis not present

## 2023-07-07 DIAGNOSIS — R0689 Other abnormalities of breathing: Secondary | ICD-10-CM | POA: Diagnosis not present

## 2023-07-07 DIAGNOSIS — J810 Acute pulmonary edema: Secondary | ICD-10-CM | POA: Diagnosis present

## 2023-07-07 DIAGNOSIS — R0989 Other specified symptoms and signs involving the circulatory and respiratory systems: Secondary | ICD-10-CM | POA: Diagnosis not present

## 2023-07-07 DIAGNOSIS — J984 Other disorders of lung: Secondary | ICD-10-CM | POA: Diagnosis not present

## 2023-07-07 HISTORY — DX: Unspecified atrial fibrillation: I48.91

## 2023-07-07 LAB — COMPREHENSIVE METABOLIC PANEL
ALT: 11 U/L (ref 0–44)
AST: 18 U/L (ref 15–41)
Albumin: 3.5 g/dL (ref 3.5–5.0)
Alkaline Phosphatase: 72 U/L (ref 38–126)
Anion gap: 14 (ref 5–15)
BUN: 46 mg/dL — ABNORMAL HIGH (ref 6–20)
CO2: 21 mmol/L — ABNORMAL LOW (ref 22–32)
Calcium: 8.7 mg/dL — ABNORMAL LOW (ref 8.9–10.3)
Chloride: 104 mmol/L (ref 98–111)
Creatinine, Ser: 7.69 mg/dL — ABNORMAL HIGH (ref 0.44–1.00)
GFR, Estimated: 6 mL/min — ABNORMAL LOW (ref >60)
Glucose, Bld: 104 mg/dL — ABNORMAL HIGH (ref 70–99)
Potassium: 3.7 mmol/L (ref 3.5–5.1)
Sodium: 139 mmol/L (ref 135–145)
Total Bilirubin: 0.7 mg/dL (ref 0.3–1.2)
Total Protein: 7.3 g/dL (ref 6.5–8.1)

## 2023-07-07 LAB — HEPATITIS B SURFACE ANTIGEN: Hepatitis B Surface Ag: NONREACTIVE

## 2023-07-07 LAB — I-STAT VENOUS BLOOD GAS, ED
Acid-Base Excess: 0 mmol/L (ref 0.0–2.0)
Bicarbonate: 25.2 mmol/L (ref 20.0–28.0)
Calcium, Ion: 1.06 mmol/L — ABNORMAL LOW (ref 1.15–1.40)
HCT: 40 % (ref 36.0–46.0)
Hemoglobin: 13.6 g/dL (ref 12.0–15.0)
O2 Saturation: 94 %
Potassium: 3.7 mmol/L (ref 3.5–5.1)
Sodium: 142 mmol/L (ref 135–145)
TCO2: 27 mmol/L (ref 22–32)
pCO2, Ven: 42.9 mm[Hg] — ABNORMAL LOW (ref 44–60)
pH, Ven: 7.378 (ref 7.25–7.43)
pO2, Ven: 74 mm[Hg] — ABNORMAL HIGH (ref 32–45)

## 2023-07-07 LAB — I-STAT CHEM 8, ED
BUN: 43 mg/dL — ABNORMAL HIGH (ref 6–20)
Calcium, Ion: 1.05 mmol/L — ABNORMAL LOW (ref 1.15–1.40)
Chloride: 107 mmol/L (ref 98–111)
Creatinine, Ser: 8.2 mg/dL — ABNORMAL HIGH (ref 0.44–1.00)
Glucose, Bld: 105 mg/dL — ABNORMAL HIGH (ref 70–99)
HCT: 40 % (ref 36.0–46.0)
Hemoglobin: 13.6 g/dL (ref 12.0–15.0)
Potassium: 3.7 mmol/L (ref 3.5–5.1)
Sodium: 143 mmol/L (ref 135–145)
TCO2: 23 mmol/L (ref 22–32)

## 2023-07-07 LAB — ECHOCARDIOGRAM COMPLETE
AR max vel: 2.11 cm2
AV Area VTI: 2 cm2
AV Area mean vel: 2.05 cm2
AV Mean grad: 4 mm[Hg]
AV Peak grad: 7.2 mm[Hg]
Ao pk vel: 1.34 m/s
Area-P 1/2: 5.06 cm2
Height: 65 in
S' Lateral: 3 cm
Single Plane A4C EF: 61.3 %
Weight: 2328.06 [oz_av]

## 2023-07-07 LAB — GLUCOSE, CAPILLARY
Glucose-Capillary: 118 mg/dL — ABNORMAL HIGH (ref 70–99)
Glucose-Capillary: 96 mg/dL (ref 70–99)
Glucose-Capillary: 86 mg/dL (ref 70–99)
Glucose-Capillary: 75 mg/dL (ref 70–99)
Glucose-Capillary: 210 mg/dL — ABNORMAL HIGH (ref 70–99)

## 2023-07-07 LAB — CORTISOL: Cortisol, Plasma: 6.5 ug/dL

## 2023-07-07 LAB — RESP PANEL BY RT-PCR (RSV, FLU A&B, COVID)  RVPGX2
Influenza A by PCR: NEGATIVE
Influenza B by PCR: NEGATIVE
Resp Syncytial Virus by PCR: NEGATIVE
SARS Coronavirus 2 by RT PCR: NEGATIVE

## 2023-07-07 LAB — HEMOGLOBIN A1C
Hgb A1c MFr Bld: 5.6 % (ref 4.8–5.6)
Mean Plasma Glucose: 114.02 mg/dL

## 2023-07-07 LAB — PROTIME-INR
INR: 1 (ref 0.8–1.2)
Prothrombin Time: 13.4 s (ref 11.4–15.2)

## 2023-07-07 LAB — CBC
HCT: 37.2 % (ref 36.0–46.0)
Hemoglobin: 11.9 g/dL — ABNORMAL LOW (ref 12.0–15.0)
MCH: 24.9 pg — ABNORMAL LOW (ref 26.0–34.0)
MCHC: 32 g/dL (ref 30.0–36.0)
MCV: 77.8 fL — ABNORMAL LOW (ref 80.0–100.0)
Platelets: 182 10*3/uL (ref 150–400)
RBC: 4.78 MIL/uL (ref 3.87–5.11)
RDW: 15.6 % — ABNORMAL HIGH (ref 11.5–15.5)
WBC: 9.3 10*3/uL (ref 4.0–10.5)
nRBC: 0 % (ref 0.0–0.2)

## 2023-07-07 LAB — TROPONIN I (HIGH SENSITIVITY)
Troponin I (High Sensitivity): 73 ng/L — ABNORMAL HIGH (ref <18)
Troponin I (High Sensitivity): 70 ng/L — ABNORMAL HIGH (ref <18)

## 2023-07-07 LAB — MRSA NEXT GEN BY PCR, NASAL: MRSA by PCR Next Gen: NOT DETECTED

## 2023-07-07 LAB — PROCALCITONIN: Procalcitonin: <0.1 ng/mL

## 2023-07-07 MED ORDER — PENTAFLUOROPROP-TETRAFLUOROETH EX AERO: 1.0000 | INHALATION_SPRAY | CUTANEOUS | Status: DC | PRN

## 2023-07-07 MED ORDER — POLYETHYLENE GLYCOL 3350 17 G PO PACK: 17.0000 g | PACK | Freq: Every day | ORAL | Status: DC | PRN

## 2023-07-07 MED ORDER — CARVEDILOL 3.125 MG PO TABS
3.1250 mg | ORAL_TABLET | Freq: Two times a day (BID) | ORAL | Status: DC
  Administered 2023-07-07: 3.125 mg via ORAL
  Filled 2023-07-07: qty 1

## 2023-07-07 MED ORDER — CARVEDILOL 6.25 MG PO TABS: 6.2500 mg | ORAL_TABLET | Freq: Two times a day (BID) | ORAL | Status: DC

## 2023-07-07 MED ORDER — ORAL CARE MOUTH RINSE: 15.0000 mL | OROMUCOSAL | Status: DC | PRN

## 2023-07-07 MED ORDER — HEPARIN SODIUM (PORCINE) 1000 UNIT/ML DIALYSIS
1000.0000 [IU] | INTRAMUSCULAR | Status: DC | PRN
  Filled 2023-07-07: qty 1

## 2023-07-07 MED ORDER — FUROSEMIDE 10 MG/ML IJ SOLN
80.0000 mg | Freq: Once | INTRAMUSCULAR | Status: AC
  Administered 2023-07-07: 80 mg via INTRAVENOUS
  Filled 2023-07-07: qty 8

## 2023-07-07 MED ORDER — NITROGLYCERIN 0.4 MG SL SUBL
0.4000 mg | SUBLINGUAL_TABLET | SUBLINGUAL | Status: DC | PRN
  Administered 2023-07-07 (×3): 0.4 mg via SUBLINGUAL
  Filled 2023-07-07: qty 1

## 2023-07-07 MED ORDER — CARVEDILOL 6.25 MG PO TABS
6.2500 mg | ORAL_TABLET | Freq: Two times a day (BID) | ORAL | Status: DC
  Administered 2023-07-07 – 2023-07-08 (×2): 6.25 mg via ORAL
  Filled 2023-07-07 (×2): qty 1

## 2023-07-07 MED ORDER — LIDOCAINE-PRILOCAINE 2.5-2.5 % EX CREA: 1.0000 | TOPICAL_CREAM | CUTANEOUS | Status: DC | PRN

## 2023-07-07 MED ORDER — DOCUSATE SODIUM 100 MG PO CAPS: 100.0000 mg | ORAL_CAPSULE | Freq: Two times a day (BID) | ORAL | Status: DC | PRN

## 2023-07-07 MED ORDER — INSULIN ASPART 100 UNIT/ML IJ SOLN
0.0000 [IU] | INTRAMUSCULAR | Status: DC
  Administered 2023-07-07: 2 [IU] via SUBCUTANEOUS

## 2023-07-07 MED ORDER — CHLORHEXIDINE GLUCONATE CLOTH 2 % EX PADS
6.0000 | MEDICATED_PAD | Freq: Every day | CUTANEOUS | Status: DC
  Administered 2023-07-07 – 2023-07-08 (×2): 6 via TOPICAL

## 2023-07-07 MED ORDER — AMLODIPINE BESYLATE 10 MG PO TABS
10.0000 mg | ORAL_TABLET | Freq: Every day | ORAL | Status: DC
  Administered 2023-07-07 – 2023-07-08 (×2): 10 mg via ORAL
  Filled 2023-07-07 (×2): qty 1

## 2023-07-07 MED ORDER — NITROGLYCERIN IN D5W 200-5 MCG/ML-% IV SOLN
0.0000 ug/min | INTRAVENOUS | Status: DC
  Administered 2023-07-07: 50 ug/min via INTRAVENOUS
  Filled 2023-07-07: qty 250

## 2023-07-07 MED ORDER — IOHEXOL 350 MG/ML SOLN
100.0000 mL | Freq: Once | INTRAVENOUS | Status: AC | PRN
  Administered 2023-07-07: 100 mL via INTRAVENOUS

## 2023-07-07 MED ORDER — NITROGLYCERIN IN D5W 200-5 MCG/ML-% IV SOLN
0.0000 ug/min | INTRAVENOUS | Status: DC
  Administered 2023-07-07: 45 ug/min via INTRAVENOUS
  Filled 2023-07-07: qty 250

## 2023-07-07 MED ORDER — ATORVASTATIN CALCIUM 80 MG PO TABS
80.0000 mg | ORAL_TABLET | Freq: Every day | ORAL | Status: DC
Start: 2023-07-07 — End: 2023-07-08
  Administered 2023-07-07 – 2023-07-08 (×2): 80 mg via ORAL
  Filled 2023-07-07 (×2): qty 1

## 2023-07-07 MED ORDER — LIDOCAINE HCL (PF) 1 % IJ SOLN: 5.0000 mL | INTRAMUSCULAR | Status: DC | PRN

## 2023-07-07 MED ORDER — ALTEPLASE 2 MG IJ SOLR: 2.0000 mg | Freq: Once | INTRAMUSCULAR | Status: DC | PRN

## 2023-07-07 NOTE — Progress Notes (Signed)
Pt receives out-pt HD at [facility] on MWF 11:00 am chair time. HD clinic to fax info to navigator to provide to nephrologist for continuation of care. Will assist as needed.   Olivia Canter Renal Navigator [phone]

## 2023-07-07 NOTE — Progress Notes (Signed)
Echocardiogram 2D Echocardiogram has been performed.  Stacey Lucas 07/07/2023, 12:13 PM

## 2023-07-07 NOTE — ED Notes (Signed)
Placed on BiPap

## 2023-07-07 NOTE — H&P (Signed)
NAME:  Stacey Lucas, MRN:  [id number], DOB:  December 26, 1975, LOS: 0 ADMISSION DATE:  07/07/2023, CONSULTATION DATE:  07/07/23 REFERRING MD:  Paris Lore, PA CHIEF COMPLAINT:  Hypertensive Emergency   History of Present Illness:  47 y.o. female with a known history of end-stage renal disease on renal replacement therapy with hemodialysis.  Patient reports she does dialysis Monday, Wednesday, and Friday.  She reports she has been adherent to her outpatient medication regimen as well as her antihypertensive medication regimen.  She denies any illicit drug use but does have a known history of cocaine use.  She reports she has been able to achieve her dry weight with dialysis: 165 pounds.  Patient presented to the emergency department in respiratory distress with sudden onset of cough and shortness of breath when attempting to go to the bathroom this evening.  In the emergency department the patient was noted to have hypertensive emergency with systolic blood pressure in the 240s.  Patient was initially placed on nonrebreather mask for supplemental oxygen.  Ultimately the patient's respiratory status worsened and she was transitioned from nonrebreather mask to noninvasive mechanical ventilation after a relatively abrupt worsening in the emergency department.  Initially the patient was given sublingual nitroglycerin.  With the patient's persistent hypertensive emergency she was started on a nitroglycerin drip.  Notably she is supposed to be taking Eliquis and furosemide as an outpatient.  Patient's worsening clinical condition in the ED prompted PCCM consult.  At the time of my evaluation her systolic blood pressure was improving on her nitroglycerin drip as it was titrating upward.  At the time of my evaluation patient denied any illicit drug use.  She denied any chest pain, pressure, or tightness.  She denied any abdominal pain, nausea, or vomiting.  She reports she has had a cough but denies any mucus  production.  She denies any wheezing.  She reports no new sore throat or sinus congestion.  She denies any headache.  She denies any focal weakness, numbness, or tingling.  She denies any fever, chills, or sweats.  She reports no recent sick contacts.  Significant Hospital Events: Including procedures, antibiotic start and stop dates in addition to other pertinent events   10/28 - Admit w/ Acute Resp Failure & Hypertensive Emergency  Interim History / Subjective:  N/A  Objective   Blood pressure (!) 191/107, pulse (!) 108, temperature 98.6 F (37 C), temperature source Oral, resp. rate (!) 24, height 5\' 5"  (1.651 m), weight 67.6 kg, last menstrual period 10/11/2019, SpO2 100%.    Vent Mode: BIPAP;PCV FiO2 (%):  [40 %-100 %] 40 % Set Rate:  [12 bmp] 12 bmp PEEP:  [5 cmH20] 5 cmH20  No intake or output data in the 24 hours ending 07/07/23 0359 Filed Weights   07/07/23 0018  Weight: 67.6 kg    Examination: General: Sleeping until awoken. No acute distress.  No family at bedside.  ED nurse at bedside. Integument:  Warm & dry. No rash or bruising on exposed skin. Extremities:  No cyanosis or clubbing.  Lymphatics:  No appreciated cervical or supraclavicular lymphadenoapthy. HEENT:  Moist mucus membranes. No scleral injection or icterus.  Ventilator mask in place. Cardiovascular:  Regular rate.  No edema appreciated. No murmur appreciated. Pulmonary:  Good aeration. Clear to auscultation bilaterally anteriorly. Symmetric chest wall expansion on ventilator. Abdomen: Soft. Normoactive bowel sounds. Protuberant. Grossly nontender.  No voluntary guarding. Musculoskeletal:  Normal bulk and tone. Hand grip strength 5/5 bilaterally. No joint deformity  or effusion appreciated. Neurological:  CN 2-12 grossly in tact. No meningismus. Moving all 4 extremities equally.  Psychiatric:  Mood and affect congruent.  Difficult to assess speech with ventilator mask in place. Oriented x3.    Resolved  Hospital Problem list   N/A  Assessment & Plan:  47 y.o. female with a known history of CVA, diabetes mellitus, hyperlipidemia, hypertension, and end-stage renal disease on renal replacement therapy with hemodialysis Monday, Wednesday, and Friday.  Patient has a known history of substance abuse with cocaine.  Her last positive test was July 2024.  She herself denies any recent illicit drug use.  However, patient presents with acute hypoxic respiratory failure and hypertensive emergency to the emergency department this evening.  1.  Hypertensive emergency: Suspect secondary to substance abuse.  Checking urine drug screen.  Monitoring vitals per unit protocol.  Continuing to titrate nitroglycerin drip for goal systolic blood pressure 180-200 mmHg.  Administering Lasix 80 mg IV x 1 now.  Checking transthoracic echocardiogram.  Continuing home regimen of Coreg and Norvasc.  2.  Acute hypoxic respiratory failure: Present on admission.  Secondary to pulmonary edema as a result of hypertensive emergency.  Administering Lasix 80 mg IV x 1.  Continuous pulse oximetry monitoring is ongoing.  Utilizing noninvasive mechanical ventilation to ease work of breathing.  We will attempt to use high flow nasal cannula oxygen if the patient is able to tolerate this better.  Patient is at high risk for further clinical deterioration.  3.  History of cocaine use: Checking urine drug screen.  Continuing current dose of home Coreg.  4.  End-stage renal disease: Patient currently on renal replacement therapy.  Nephrology consulted by ED provider with report that dialysis will be arranged for approximately 7 AM this morning.  Administering Lasix 80 mg IV x 1.  Monitoring electrolytes daily.  5.  Diabetes mellitus: With patient currently receiving nitroglycerin drip which has dextrose contained in it I will be monitoring Accu-Cheks every 4 hours scheduled.  Treating patient with sensitive sliding scale algorithm.  Checking  hemoglobin A1c.  6.  Type II non-STEMI: Patient notably with elevated troponin I that is likely multifactorial in etiology.  Monitoring patient on telemetry.  Checking transthoracic echocardiogram to evaluate wall motion.  Holding home Eliquis for now.  Continuing Coreg and Lipitor.  7.  Hyperlipidemia: Continuing home Lipitor.  8.  Atrial fibrillation: Monitoring patient on telemetry.  Continuing home Coreg.  Holding Eliquis.  9.  History of CVA: No new focal deficits.  Blood pressure control as per #1.  Continuing home Lipitor.  Holding Eliquis.  10.  CODE STATUS: Patient reports she is DNR/DNI.  11.  Disposition: Admitting patient to ICU for further treatment, workup, and stabilization.  Best Practice (right click and "Reselect all SmartList Selections" daily)   Diet/type: NPO DVT prophylaxis: SCD GI prophylaxis: N/A Lines: N/A Foley:  N/A Code Status:  DNR Last date of multidisciplinary goals of care discussion [Pending]  Labs   CBC: Recent Labs  Lab 07/07/23 0011 07/07/23 0021 07/07/23 0022  WBC 9.3  --   --   HGB 11.9* 13.6 13.6  HCT 37.2 40.0 40.0  MCV 77.8*  --   --   PLT 182  --   --     Basic Metabolic Panel: Recent Labs  Lab 07/07/23 0011 07/07/23 0021 07/07/23 0022  NA 139 142 143  K 3.7 3.7 3.7  CL 104  --  107  CO2 21*  --   --  GLUCOSE 104*  --  105*  BUN 46*  --  43*  CREATININE 7.69*  --  8.20*  CALCIUM 8.7*  --   --    GFR: Estimated Creatinine Clearance: 7.5 mL/min (A) (by C-G formula based on SCr of 8.2 mg/dL (H)). Recent Labs  Lab 07/07/23 0011  WBC 9.3    Liver Function Tests: Recent Labs  Lab 07/07/23 0011  AST 18  ALT 11  ALKPHOS 72  BILITOT 0.7  PROT 7.3  ALBUMIN 3.5   No results for input(s): "LIPASE", "AMYLASE" in the last 168 hours. No results for input(s): "AMMONIA" in the last 168 hours.  ABG    Component Value Date/Time   HCO3 25.2 07/07/2023 0021   TCO2 23 07/07/2023 0022   O2SAT 94 07/07/2023 0021      Coagulation Profile: Recent Labs  Lab 07/07/23 0011  INR 1.0    Cardiac Enzymes: No results for input(s): "CKTOTAL", "CKMB", "CKMBINDEX", "TROPONINI" in the last 168 hours.  HbA1C: HbA1c, POC (controlled diabetic range)  Date/Time Value Ref Range Status  11/14/2022 09:03 AM 5.6 0.0 - 7.0 % Final   Hgb A1c MFr Bld  Date/Time Value Ref Range Status  11/14/2022 10:14 AM 5.6 4.8 - 5.6 % Final    Comment:    (NOTE)         Prediabetes: 5.7 - 6.4         Diabetes: >6.4         Glycemic control for adults with diabetes: <7.0   03/05/2022 10:45 AM 7.7 (H) 4.8 - 5.6 % Final    Comment:             Prediabetes: 5.7 - 6.4          Diabetes: >6.4          Glycemic control for adults with diabetes: <7.0     CBG: No results for input(s): "GLUCAP" in the last 168 hours.  TOXICOLOGY: UDS 03/13/23:  Cocaine Positive UDS 11/14/22:  Cocaine Positive UDS 12/30/21:  Cocaine Positive UDS 05/24/21:  Cocaine Positive   MICROBIOLOGY: SARS-CoV-2 PCR 07/07/23:  Negative RSV PCR 07/07/23:  Negative Influenza A/B PCR 07/07/23:  Negative  IMAGING: PORT CXR 07/07/23 (personally reviewed by me): Tunneled right internal jugular transvenous catheter with tip in the superior vena cava.  Trachea midline.  Normal mediastinal contour.  No pneumothorax.  No pleural effusion appreciated.  Predominantly interstitial opacities in the mid to lower lung zones suggestive of pulmonary edema.  Minimal alveolar filling and certainly no evidence for consolidation or focal opacity.  CTA CHEST/ABDOMEN/PELVIS 07/07/23 (personally reviewed by me): No aortic aneurysm or dissection.  Cardiomegaly noted with small pericardial effusion.  Tiny bilateral pleural effusions.  Lower lobe predominant septal thickening with groundglass opacities consistent with pulmonary edema.  No focal area of consolidation.  Mild wall thickening in the transverse colon noted.  No pathologic mediastinal lymphadenopathy.  No acute osseous  abnormality.  1.7 cm hypodense nodule in the right lobe of the thyroid.  Patulous esophagus.  Review of Systems:   No new rashes or abnormal bruising.  No dysuria or hematuria.  Patient reports she still does make urine.  A pertinent 14 point review of systems is negative except as per the HPI.  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 12/29/2021   Anxiety and depression    Atrial fibrillation (HCC)  Cerebral thrombosis with cerebral infarction 05/24/2021   Cocaine abuse (HCC)    Depression with suicidal ideation    [facility] admission 04/2018   Diabetes mellitus without complication (HCC)    Type II   End stage kidney disease (HCC)    GERD (gastroesophageal reflux disease)    Hyperlipidemia    Hypertension    Impingement syndrome of right shoulder    Migraine headache    Osteoarthritis of right knee 05/24/2021   Pilonidal abscess 05/2013   Pyelonephritis    Right thyroid nodule    Sciatica     Surgical History:   Past Surgical History:  Procedure Laterality Date   AV FISTULA PLACEMENT Left 03/17/2023   Procedure: LEFT ARM BRACHIOCEPHALIC ARTERIOVENOUS (AV) FISTULA CREATION;  Surgeon: Victorino Sparrow, MD;  Location: [facility] OR;  Service: Vascular;  Laterality: Left;   BUBBLE STUDY  01/01/2022   Procedure: BUBBLE STUDY;  Surgeon: Maisie Fus, MD;  Location: [facility] ENDOSCOPY;  Service: Cardiovascular;;   IR FLUORO GUIDE CV LINE RIGHT  03/14/2023   IR US GUIDE VASC ACCESS RIGHT  03/14/2023   TEE WITHOUT CARDIOVERSION N/A 01/01/2022   Procedure: TRANSESOPHAGEAL ECHOCARDIOGRAM (TEE);  Surgeon: Maisie Fus, MD;  Location: [facility] ENDOSCOPY;  Service: Cardiovascular;  Laterality: N/A;   tubal     TUBAL LIGATION       Social History:   Social History   Socioeconomic History   Marital status: Single    Spouse name: Not on file   Number of children: 3    Years of education: Not on file   Highest education level: Not on file  Occupational History   Not on file  Tobacco Use   Smoking status: Every Day    Current packs/day: 0.00    Types: Cigarettes    Last attempt to quit: 01/16/2022    Years since quitting: 1.4    Passive exposure: Current   Smokeless tobacco: Never  Vaping Use   Vaping status: Never Used  Substance and Sexual Activity   Alcohol use: Yes    Comment: Socially   Drug use: No    Comment: Denies despite h/o cocaine use   Sexual activity: Yes    Birth control/protection: None  Other Topics Concern   Not on file  Social History Narrative   Patient reports she lives with her boyfriend. She currently is disabled and doesn't work.    Social Determinants of Health   Financial Resource Strain: Not on file  Food Insecurity: No Food Insecurity (03/15/2023)   Hunger Vital Sign    Worried About Running Out of Food in the Last Year: Never true    Ran Out of Food in the Last Year: Never true  Transportation Needs: Unmet Transportation Needs (03/16/2023)   PRAPARE - Administrator, Civil Service (Medical): Yes    Lack of Transportation (Non-Medical): Yes  Physical Activity: Not on file  Stress: Stress Concern Present (03/20/2023)   [employer] of Occupational Health - Occupational Stress Questionnaire    Feeling of Stress : Rather much  Social Connections: Unknown (01/12/2022)   [facility]   Social Network    Social Network: Not on file     Family History:   Family History  Problem Relation Age of Onset   Diabetes Mother    Hypertension Mother    Arthritis Mother    Diabetes Father    Hypertension Father     Allergies Allergies  Allergen Reactions  Neurontin [Gabapentin] Other (See Comments)    Weakness  Balance impairment      Home Medications  Prior to Admission medications   Medication Sig Start Date End Date Taking? Authorizing Provider  amLODipine  (NORVASC) 10 MG tablet Take 1 tablet (10 mg total) by mouth daily. 02/04/23   Storm Frisk, MD  apixaban (ELIQUIS) 2.5 MG TABS tablet Take 1 tablet (2.5 mg total) by mouth 2 (two) times daily. 12/03/22   Storm Frisk, MD  atorvastatin (LIPITOR) 80 MG tablet Take 1 tablet (80 mg total) by mouth daily. 12/03/22   Storm Frisk, MD  azelastine (OPTIVAR) 0.05 % ophthalmic solution Place 1 drop into both eyes 2 (two) times daily. Patient not taking: Reported on 03/13/2023 08/21/22   Waldon Merl, PA-C  blood glucose meter kit and supplies KIT Dispense based on patient and insurance preference. Use up to four times daily as directed. 01/31/22   Fanny Dance, MD  carvedilol (COREG) 6.25 MG tablet Take 0.5 tablets (3.125 mg total) by mouth 2 (two) times daily with a meal. Patient taking differently: Take 6.25 mg by mouth 2 (two) times daily with a meal. 02/04/23 03/13/23  Storm Frisk, MD     Critical care time:  I have spent a total of 34 minutes of critical care time today managing the patient's hypertensive emergency and acute hypoxic respiratory failure requiring full support from mechanical ventilation as well as initiation of nitroglycerin infusion independent of time spent during procedures caring for the patient, discussing plan of care with nursing staff at bedside, and reviewing the patient's electronic medical record.  Patient's prognosis is guarded.  Her risk for further complications and death given her critical illness and comorbid medical conditions remains high.    Donna Christen Jamison Neighbor, M.D. [facility] Pulmonary & Critical Care Medicine 3:59 AM 07/07/23   Please see Amion for pager details.  From 7A-7P if no response please call [phone] After hours, please call ELink [phone]

## 2023-07-07 NOTE — Progress Notes (Signed)
Patient transported on the BiPAP to 2H05 with no complications. Vitals stable.

## 2023-07-07 NOTE — Consult Note (Signed)
Renal Service Consult Note [facility]  Stacey Lucas 07/07/2023 Stacey Krabbe, MD Requesting Physician: Dr. Merrily Pew  Reason for Consult: ESRD pt w/ resp distress and uncont HTN HPI: The patient is a 47 y.o. year-old w/ PMH as below who presented to ED w/ resp distress and hypoxemia. +cough and SOB. Is on HD MWF. Hx of cocaine use. In ED BP's 240 systolic. Tried NRB then needed bipap. IV ntg started. Admitted to ICU. Dialysis was initiated early this am around 5 am. SOB better this am per patient, she was still on HD when seen at 8:30am.  We are asked to see for dialysis.   Pt seen in room. On bipap and cannot give a history.     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 admission 04/2018   Diabetes mellitus without complication (HCC)    Type II   End stage kidney disease (HCC)    GERD (gastroesophageal reflux disease)    Hyperlipidemia    Hypertension    Impingement syndrome of right shoulder    Migraine headache    Osteoarthritis of right knee 05/24/2021   Pilonidal abscess 05/2013   Pyelonephritis    Right thyroid nodule    Sciatica    Past Surgical History  Past Surgical History:  Procedure Laterality Date   AV FISTULA PLACEMENT Left 03/17/2023   Procedure: LEFT ARM BRACHIOCEPHALIC ARTERIOVENOUS (AV) FISTULA CREATION;  Surgeon: Victorino Sparrow, MD;  Location: [facility] OR;  Service: Vascular;  Laterality: Left;   BUBBLE STUDY  01/01/2022   Procedure: BUBBLE STUDY;  Surgeon: Maisie Fus, MD;  Location: [facility] ENDOSCOPY;  Service: Cardiovascular;;   IR FLUORO GUIDE CV LINE RIGHT  03/14/2023   IR US GUIDE VASC ACCESS RIGHT  03/14/2023    TEE WITHOUT CARDIOVERSION N/A 01/01/2022   Procedure: TRANSESOPHAGEAL ECHOCARDIOGRAM (TEE);  Surgeon: Maisie Fus, MD;  Location: [facility] ENDOSCOPY;  Service: Cardiovascular;  Laterality: N/A;   tubal     TUBAL LIGATION     Family History  Family History  Problem Relation Age of Onset   Diabetes Mother    Hypertension Mother    Arthritis Mother    Diabetes Father    Hypertension Father    Social History  reports that she has been smoking cigarettes. She has been exposed to tobacco smoke. She has never used smokeless tobacco. She reports current alcohol use. She reports that she does not use drugs. Allergies  Allergies  Allergen Reactions   Neurontin [Gabapentin] Other (See Comments)    Weakness  Balance impairment    Home medications Prior to Admission medications   Medication Sig Start Date End Date Taking? Authorizing Provider  amLODipine (NORVASC) 10 MG tablet Take 1 tablet (10 mg total) by mouth daily. 02/04/23  Yes Storm Frisk, MD  sevelamer carbonate (RENVELA) 800 MG tablet Take 800 mg by mouth daily as needed. 05/15/23 05/14/24 Yes [provider]  apixaban (ELIQUIS) 2.5 MG TABS tablet Take 1 tablet (2.5 mg total) by mouth 2 (two) times daily. Patient not taking: Reported on 07/07/2023 12/03/22   Storm Frisk, MD  azelastine (OPTIVAR) 0.05 % ophthalmic  solution Place 1 drop into both eyes 2 (two) times daily. Patient not taking: Reported on 03/13/2023 08/21/22   Waldon Merl, PA-C  carvedilol (COREG) 6.25 MG tablet Take 0.5 tablets (3.125 mg total) by mouth 2 (two) times daily with a meal. Patient not taking: Reported on 07/07/2023 02/04/23 07/06/24  Storm Frisk, MD     Vitals:   07/07/23 1145 07/07/23 1200 07/07/23 1215 07/07/23 1230  BP: (!) 161/77 (!) 160/80 (!) 157/86 (!) 171/88  Pulse: 95 96 95 99  Resp: (!) 27 (!) 8 (!) 21 (!) 23  Temp:   98.8 F (37.1 C)   TempSrc:      SpO2: 100% 98% 99% 99%  Weight:      Height:       Exam Gen  alert, no distress No rash, cyanosis or gangrene Sclera anicteric, throat clear  No jvd or bruits Chest clear bilat to bases, no rales/ wheezing RRR no RG Abd soft ntnd no mass or ascites +bs GU defer MS no joint effusions or deformity Ext no LE or UE edema, no wounds or ulcers Neuro is alert, Ox 3 , nf    RIJ TDC intact    Renal-related home meds: - norvasc 10 - eliquis 2.5 bid - coreg 3.125 bid - renvela 800 ac tid    OP HD: [facility] - details pending   CXR 10/28 - IMPRESSION: 1. Cardiomegaly with pulmonary vascular congestion. 2. Interstitial prominence bilaterally with mild airspace disease at the lung bases, possible edema or infiltrate.   CT chest (my read) --> diffuse GG changes throughout both lungs c/w edema   K+ 3.7   BUN 43  creat 8.2   Assessment/ Plan: Acute hypoxic resp failure - w/ pulm edema by CT and CXR. Improving significantly w/ iHD this am. 4 L off today. Off bipap now and on room air. Will need standing wt when able. Will follow.  ESKD - on HD w/ [facility]. Had HD this am. Next HD Wed most likely.  HTN'sive urgency - SBP 240 in ED. IV ntg and HD. BP's better/ improving. Resume home meds.  Volume - vol overload presumably. No sig edema on exam. Get dry wt, and standing wt here.  Anemia of eskd - Hb 11- 13 here. No esa needs.  MBD ckd - CCa in range, add on phos.  DNR/ DNI DM2    Vinson Moselle  MD [facility] 07/07/2023, 12:52 PM  Recent Labs  Lab 07/07/23 0011 07/07/23 0021 07/07/23 0022  HGB 11.9* 13.6 13.6  ALBUMIN 3.5  --   --   CALCIUM 8.7*  --   --   CREATININE 7.69*  --  8.20*  K 3.7 3.7 3.7   Inpatient medications:  amLODipine  10 mg Oral Daily   atorvastatin  80 mg Oral Daily   carvedilol  6.25 mg Oral BID WC   Chlorhexidine Gluconate Cloth  6 each Topical Q0600   insulin aspart  0-6 Units Subcutaneous Q4H    nitroGLYCERIN Stopped (07/07/23 1125)   alteplase, docusate sodium, heparin, lidocaine (PF),  lidocaine-prilocaine, nitroGLYCERIN, mouth rinse, pentafluoroprop-tetrafluoroeth, polyethylene glycol

## 2023-07-07 NOTE — Progress Notes (Signed)
47 year old female with end-stage renal disease on hemodialysis who was admitted with hypertensive emergency early this morning in the setting of cocaine abuse  Remain on nitroglycerin infusion, came off of BiPAP Started on oral antihypertensive meds, currently receiving hemodialysis.  Once oral antihypertensive kicks in, will try to take off nitroglycerin infusion   Cheri Fowler, MD

## 2023-07-07 NOTE — TOC Initial Note (Signed)
Transition of Care [facility]) - Initial/Assessment Note    Patient Details  Name: Stacey Lucas MRN: [id number] Date of Birth: [birth year]  Transition of Care [facility]) CM/SW Contact:    Elliot Cousin, RN Phone Number:[phone] 07/07/2023, 1:08 PM  Clinical Narrative:   CM spoke to pt and states she lives with SO. States she uses Medicaid transportation to get appts. States she has an aide that comes Mon-Sun for 3 hours. Will continue to follow for dc needs.                 Expected Discharge Plan: Home/Self Care Barriers to Discharge: Continued Medical Work up   Patient Goals and CMS Choice Patient states their goals for this hospitalization and ongoing recovery are:: wants to remain independent          Expected Discharge Plan and Services   Discharge Planning Services: CM Consult   Living arrangements for the past 2 months: Apartment                                      Prior Living Arrangements/Services Living arrangements for the past 2 months: Apartment   Patient language and need for interpreter reviewed:: Yes Do you feel safe going back to the place where you live?: Yes      Need for Family Participation in Patient Care: Yes (Comment) Care giver support system in place?: Yes (comment) Current home services: DME, Homehealth aide (Rollator, bedside commode, aide) Criminal Activity/Legal Involvement Pertinent to Current Situation/Hospitalization: No - Comment as needed  Activities of Daily Living      Permission Sought/Granted Permission sought to share information with : Case Manager, Family Supports, PCP Permission granted to share information with : Yes, Verbal Permission Granted  Share Information with NAME: Quincy Sheehan     Permission granted to share info w Relationship: SO  Permission granted to share info w Contact Information: [phone]  Emotional Assessment Appearance:: Appears stated age Attitude/Demeanor/Rapport: Lethargic Affect  (typically observed): Accepting Orientation: : Oriented to Self, Oriented to Place, Oriented to  Time, Oriented to Situation   Psych Involvement: No (comment)  Admission diagnosis:  Flash pulmonary edema (HCC) [J81.0] Hypertensive emergency [I16.1] Acute hypoxic respiratory failure (HCC) [J96.01] Patient Active Problem List   Diagnosis Date Noted   Hypertensive emergency 07/07/2023   Gait disturbance, post-stroke 03/06/2023   ESRD on dialysis (HCC) 02/04/2023   Polysubstance abuse (HCC) 12/03/2022   Nephrotic syndrome 11/15/2022   Cerebrovascular accident (CVA) (HCC) 11/15/2022   Anemia 01/16/2022   Atrial fibrillation (HCC) 07/04/2021   H/O: stroke 07/04/2021   Osteoarthritis of right knee 05/24/2021   Type 2 diabetes mellitus with other specified complication, without long-term current use of insulin (HCC) 05/23/2021   Essential hypertension 05/23/2021   Hyperlipidemia 05/23/2021   Thyroid nodule 05/23/2021   MDD (major depressive disorder), recurrent severe, without psychosis (HCC) 04/27/2018   PCP:  Storm Frisk, MD Pharmacy:   [facility] [phone] - [place], [place] - [address] AT [facility] [address] [place] [address] Phone: [phone] Fax: [phone]  [id number] - [place], [place] - [address] AT [facility] [address] [place] [address] Phone: [phone] Fax: [phone]  [facility] #[id number] - [place],  - [address] AT [facility] [address] [place] [address] Phone: [phone] Fax: [phone]     Social Determinants of Health (SDOH) Social History: SDOH Screenings   Food Insecurity: No Food Insecurity (03/15/2023)  Housing: Low Risk  (03/14/2023)  Transportation Needs: Unmet Transportation Needs (03/16/2023)  Utilities: At Risk (03/16/2023)  Alcohol Screen: Low Risk  (04/25/2018)  Depression (PHQ2-9): Low Risk   (02/04/2023)  Recent Concern: Depression (PHQ2-9) - Medium Risk (12/03/2022)  Social Connections: Unknown (01/12/2022)   Received from [facility], [facility]  Stress: Stress Concern Present (03/20/2023)  Tobacco Use: High Risk (07/07/2023)   SDOH Interventions:     Readmission Risk Interventions     No data to display

## 2023-07-07 NOTE — Progress Notes (Signed)
eLink Physician-Brief Progress Note Patient Name: Stacey Lucas DOB: 23-Nov-1975 MRN: [id number]   Date of Service  07/07/2023  HPI/Events of Note  73 F ESRD on HD, hx of cocaine use, HTN, DM, HLD, CVA, HFpEF, afib on apixaban, presented with respiratory distress with SBP 240s. Placed on NIPPV and placed on NTG drip given Lasix 80 mg IV. CXR and CT  consistent with vascular congestion with anasarca.  Seen tolerating BiPap 15/5 rate of 12, FiO2 40% MV 11, peak 15  eICU Interventions  Continue afterload reduction with NIPPV, diuresis/dialysis, BP control and NTG Tox screen remains pending at this time     Intervention Category Evaluation Type: New Patient Evaluation  Darl Pikes 07/07/2023, 5:00 AM

## 2023-07-07 NOTE — Progress Notes (Signed)
Patient taken off Bipap due to vomiting a small amount of clear secretions. On room air, patient is resting comfortably, no increased WOB or SOB. MD notified.

## 2023-07-07 NOTE — ED Notes (Signed)
Pt states not able to breathe - RT called and advised Birder Robson, PA

## 2023-07-07 NOTE — Procedures (Signed)
I have reviewed the HD regimen and made appropriate changes.  Vinson Moselle MD  [facility] 07/07/2023, 8:32 AM

## 2023-07-07 NOTE — ED Provider Notes (Signed)
[facility] EMERGENCY DEPARTMENT AT [facility] Provider Note   CSN: [id number] Arrival date & time: 07/07/23  0000     History  Chief Complaint  Patient presents with  . Respiratory Distress    Stacey Lucas is a 47 y.o. female ESRD on HD Monday Wednesday Friday, has not missed any sessions.  Presents with respiratory distress.  States that she was watching football, got up and walked to the restroom with sudden onset coughing and shortness of breath.  EMS report of's oxygen saturation 76 percent on room air at time of arrival, BP 240s over 120s, patient placed on nonrebreather with improvement in oxygen saturation to 100%.  Last BP 218/104 and route.  Patient denies any chest pain.  Additional history of type 2 diabetes, atrial fibrillation, polysubstance use and CVA in the past.  Currently on furosemide and Eliquis. Level 5 caveat due to acuity of presentation upon arrival.  EDP Dr. Posey Rea Accompanied at bedside  HPI     Home Medications Prior to Admission medications   Medication Sig Start Date End Date Taking? Authorizing Provider  amLODipine (NORVASC) 10 MG tablet Take 1 tablet (10 mg total) by mouth daily. 02/04/23   Storm Frisk, MD  apixaban (ELIQUIS) 2.5 MG TABS tablet Take 1 tablet (2.5 mg total) by mouth 2 (two) times daily. 12/03/22   Storm Frisk, MD  atorvastatin (LIPITOR) 80 MG tablet Take 1 tablet (80 mg total) by mouth daily. 12/03/22   Storm Frisk, MD  azelastine (OPTIVAR) 0.05 % ophthalmic solution Place 1 drop into both eyes 2 (two) times daily. Patient not taking: Reported on 03/13/2023 08/21/22   Waldon Merl, PA-C  blood glucose meter kit and supplies KIT Dispense based on patient and insurance preference. Use up to four times daily as directed. 01/31/22   Fanny Dance, MD  carvedilol (COREG) 6.25 MG tablet Take 0.5 tablets (3.125 mg total) by mouth 2 (two) times daily with a meal. Patient taking differently: Take 6.25 mg by mouth  2 (two) times daily with a meal. 02/04/23 03/13/23  Storm Frisk, MD      Allergies    Neurontin [gabapentin]    Review of Systems   Review of Systems  Constitutional: Negative.   Respiratory:  Positive for cough and shortness of breath.   Cardiovascular:  Negative for chest pain and leg swelling.    Physical Exam Updated Vital Signs BP (!) 193/109   Pulse (!) 108   Temp 98.6 F (37 C) (Oral)   Resp (!) 24   Ht 5\' 5"  (1.651 m)   Wt 67.6 kg   LMP 10/11/2019   SpO2 100%   BMI 24.80 kg/m  Physical Exam Vitals and nursing note reviewed.  Constitutional:      Appearance: She is not ill-appearing or toxic-appearing.  HENT:     Head: Normocephalic and atraumatic.     Mouth/Throat:     Mouth: Mucous membranes are moist.     Pharynx: No oropharyngeal exudate or posterior oropharyngeal erythema.  Eyes:     General:        Right eye: No discharge.        Left eye: No discharge.     Conjunctiva/sclera: Conjunctivae normal.  Cardiovascular:     Rate and Rhythm: Regular rhythm. Tachycardia present.     Pulses: Normal pulses.     Heart sounds: Normal heart sounds. No murmur heard. Pulmonary:     Effort: Tachypnea and accessory muscle usage  present. No respiratory distress.     Breath sounds: Examination of the right-lower field reveals rales. Examination of the left-lower field reveals rales. Rales present. No wheezing.     Comments: On NRB, deescalated to [place] on 6L with O2 sat of 100% . Abdominal:     General: Bowel sounds are normal. There is no distension.     Palpations: Abdomen is soft.     Tenderness: There is no abdominal tenderness. There is no guarding or rebound.  Musculoskeletal:        General: No deformity.     Cervical back: Neck supple.     Right lower leg: No edema.     Left lower leg: No edema.  Skin:    General: Skin is warm and dry.     Capillary Refill: Capillary refill takes less than 2 seconds.  Neurological:     General: No focal deficit present.      Mental Status: She is alert and oriented to person, place, and time. Mental status is at baseline.  Psychiatric:        Mood and Affect: Mood normal.    ED Results / Procedures / Treatments   Labs (all labs ordered are listed, but only abnormal results are displayed) Labs Reviewed  COMPREHENSIVE METABOLIC PANEL - Abnormal; Notable for the following components:      Result Value   CO2 21 (*)    Glucose, Bld 104 (*)    BUN 46 (*)    Creatinine, Ser 7.69 (*)    Calcium 8.7 (*)    GFR, Estimated 6 (*)    All other components within normal limits  CBC - Abnormal; Notable for the following components:   Hemoglobin 11.9 (*)    MCV 77.8 (*)    MCH 24.9 (*)    RDW 15.6 (*)    All other components within normal limits  I-STAT CHEM 8, ED - Abnormal; Notable for the following components:   BUN 43 (*)    Creatinine, Ser 8.20 (*)    Glucose, Bld 105 (*)    Calcium, Ion 1.05 (*)    All other components within normal limits  I-STAT VENOUS BLOOD GAS, ED - Abnormal; Notable for the following components:   pCO2, Ven 42.9 (*)    pO2, Ven 74 (*)    Calcium, Ion 1.06 (*)    All other components within normal limits  TROPONIN I (HIGH SENSITIVITY) - Abnormal; Notable for the following components:   Troponin I (High Sensitivity) 73 (*)    All other components within normal limits  TROPONIN I (HIGH SENSITIVITY) - Abnormal; Notable for the following components:   Troponin I (High Sensitivity) 70 (*)    All other components within normal limits  RESP PANEL BY RT-PCR (RSV, FLU A&B, COVID)  RVPGX2  PROTIME-INR  CORTISOL  PROCALCITONIN  HEMOGLOBIN A1C  RAPID URINE DRUG SCREEN, HOSP PERFORMED  HEPATITIS B SURFACE ANTIGEN  HEPATITIS B SURFACE ANTIBODY, QUANTITATIVE  I-STAT CG4 LACTIC ACID, ED    EKG None  Radiology CT Angio Chest/Abd/Pel for Dissection W and/or Wo Contrast  Result Date: 07/07/2023 CLINICAL DATA:  Shortness of breath respiratory distress EXAM: CT ANGIOGRAPHY CHEST,  ABDOMEN AND PELVIS TECHNIQUE: Non-contrast CT of the chest was initially obtained. Multidetector CT imaging through the chest, abdomen and pelvis was performed using the standard protocol during bolus administration of intravenous contrast. Multiplanar reconstructed images and MIPs were obtained and reviewed to evaluate the vascular anatomy. RADIATION DOSE REDUCTION: This exam  was performed according to the departmental dose-optimization program which includes automated exposure control, adjustment of the mA and/or kV according to patient size and/or use of iterative reconstruction technique. CONTRAST:  OMNIPAQUE IOHEXOL 350 MG/ML SOLN COMPARISON:  Chest x-ray 07/07/2023, CT 03/10/2023 FINDINGS: CTA CHEST FINDINGS Cardiovascular: Non contrasted images of the chest demonstrate no acute intramural hematoma. Aorta is nonaneurysmal. No dissection is seen. Mild atherosclerosis. Right-sided central venous catheter with tip at the cavoatrial region. Cardiomegaly. Small pericardial effusion. Mediastinum/Nodes: Midline trachea. 1.7 cm hypodense nodule in the right lobe of thyroid. Mild mediastinal lymph nodes. AP window lymph nodes up to 1.2 cm. Subcarinal lymph node measures up to 2 cm. Small bilateral hilar nodes measuring 1.4 cm on the right and 1.2 cm on the left. Esophagus within normal limits. Lungs/Pleura: Small bilateral pleural effusions. Diffuse hazy pulmonary density with septal thickening, felt most consistent with pulmonary edema. Musculoskeletal: No acute osseous abnormality. Review of the MIP images confirms the above findings. CTA ABDOMEN AND PELVIS FINDINGS VASCULAR Aorta: Normal caliber aorta without aneurysm, dissection, vasculitis or significant stenosis. Celiac: Patent without evidence of aneurysm, dissection, vasculitis or significant stenosis. SMA: Patent without evidence of aneurysm, dissection, vasculitis or significant stenosis. Renals: Both renal arteries are patent without evidence of  aneurysm, dissection, vasculitis, fibromuscular dysplasia or significant stenosis. IMA: Patent without evidence of aneurysm, dissection, vasculitis or significant stenosis. Inflow: Patent without evidence of aneurysm, dissection, vasculitis or significant stenosis. Veins: Suboptimally evaluated Review of the MIP images confirms the above findings. NON-VASCULAR Hepatobiliary: No focal hepatic abnormality or biliary dilatation. Contracted gallbladder without calcified stone. Pancreas: Unremarkable. No pancreatic ductal dilatation or surrounding inflammatory changes. Spleen: Normal in size without focal abnormality. Adrenals/Urinary Tract: Adrenal glands are within normal limits. Slightly atrophic right kidney. No hydronephrosis. The bladder is unremarkable Stomach/Bowel: Stomach nonenlarged. No dilated small bowel. Question mild wall thickening of the transverse colon. Negative appendix. Lymphatic: No suspicious lymph nodes Reproductive: Uterus and bilateral adnexa are unremarkable. Other: No free air. No significant ascites. Generalized subcutaneous edema consistent with anasarca. Musculoskeletal: No acute osseous abnormality Review of the MIP images confirms the above findings. IMPRESSION: 1. Negative for acute aortic dissection or aneurysm. 2. Cardiomegaly with small pericardial effusion. Small bilateral pleural effusions. Diffuse hazy pulmonary density with septal thickening, felt most consistent with pulmonary edema. 3. Question mild wall thickening/colitis of the transverse colon versus under distension 4. Generalized subcutaneous edema consistent with anasarca. 5. 1.7 cm hypodense nodule in the right lobe of thyroid. Recommend thyroid US (ref: J Am Coll Radiol. 2015 Feb;12(2): 143-50).This should be performed on a nonemergent basis. Electronically Signed   By: Jasmine Pang M.D.   On: 07/07/2023 01:37   DG Chest Port 1 View  Result Date: 07/07/2023 CLINICAL DATA:  Shortness of breath, respiratory distress.  Coughing and difficulty catching breath. EXAM: PORTABLE CHEST 1 VIEW COMPARISON:  03/10/2023. FINDINGS: Heart is enlarged and the mediastinal contour is within normal limits. The pulmonary vasculature is mildly distended. Interstitial prominence is present bilaterally with mild airspace disease at the lung bases. No effusion or pneumothorax. A right internal jugular central venous catheter terminates at the cavoatrial junction. No acute osseous abnormality. IMPRESSION: 1. Cardiomegaly with pulmonary vascular congestion. 2. Interstitial prominence bilaterally with mild airspace disease at the lung bases, possible edema or infiltrate. Electronically Signed   By: Thornell Sartorius M.D.   On: 07/07/2023 00:36    Procedures .Critical Care  Performed by: Paris Lore, PA-C Authorized by: Paris Lore, PA-C  Critical care provider statement:    Critical care time (minutes):  90   Critical care was time spent personally by me on the following activities:  Development of treatment plan with patient or surrogate, discussions with consultants, evaluation of patient's response to treatment, examination of patient, obtaining history from patient or surrogate, ordering and performing treatments and interventions, ordering and review of laboratory studies, ordering and review of radiographic studies, pulse oximetry and re-evaluation of patient's condition     Medications Ordered in ED Medications  nitroGLYCERIN (NITROSTAT) SL tablet 0.4 mg (0.4 mg Sublingual Given 07/07/23 0024)  docusate sodium (COLACE) capsule 100 mg (has no administration in time range)  polyethylene glycol (MIRALAX / GLYCOLAX) packet 17 g (has no administration in time range)  insulin aspart (novoLOG) injection 0-6 Units (has no administration in time range)  nitroGLYCERIN 50 mg in dextrose 5 % 250 mL (0.2 mg/mL) infusion (has no administration in time range)  amLODipine (NORVASC) tablet 10 mg (has no administration in time  range)  atorvastatin (LIPITOR) tablet 80 mg (has no administration in time range)  carvedilol (COREG) tablet 3.125 mg (has no administration in time range)  furosemide (LASIX) injection 80 mg (has no administration in time range)  Chlorhexidine Gluconate Cloth 2 % PADS 6 each (has no administration in time range)  iohexol (OMNIPAQUE) 350 MG/ML injection 100 mL (100 mLs Intravenous Contrast Given 07/07/23 0118)    ED Course/ Medical Decision Making/ A&P Clinical Course as of 07/07/23 0357  Mon Jul 07, 2023  0220 I was called to bedside by ED RN Corrie Dandy due to patient's acute decompensation.  Patient suddenly complaining of worsening chest pain and shortness of breath, decompensation of her oxygen saturation on nasal cannula.  On auscultation of the lungs, patient now with rales throughout the lung fields bilaterally.  Escalated to nonrebreather with improvement of saturations, however persistent increased work of breathing/respiratory distress and exquisite hypertension. Patient initially responded well to sublingual nitroglycerin, however at this time has decompensated - initiated on nitro drip.  Patient placed on BiPAP due to work of breathing with improvement in work of breathing, tachycardia and blood pressures on nitroglycerin. [RS]  [phone] Consult Dr. Jamison Neighbor, critical care physician who is agreeable to admitting this patient to his service.  Appreciate his collaboration in the care of this patient.  Patient up to 90 mics per minute of nitroglycerin at this time.  Continues to improve from a work of breathing standpoint.  Remains critical. [RS]  [phone] Consult call from Dr. Valentino Nose, nephrology, who states patient will be added for dialysis around 7 am. I appreciate his collaboration in the care of this patient.  [RS]    Clinical Course User Index [RS] Vernella Niznik, Eugene Gavia, PA-C                                 Medical Decision Making 47 year old female who presents in respiratory stress, ESRD on  HD.  Tachycardic, exquisitely hypertensive on intake and 100% on 6 L nasal cannula after de-escalation from nonrebreather.  Regular rhythm on cardiac exam, rales in the lung bases bilaterally.  No lower extremity edema.  Sublingual nitroglycerin administered due to exquisite hypertension likely inciting etiology of patient's respiratory distress.  Amount and/or Complexity of Data Reviewed Labs: ordered.    Details: CBC with mild anemia hemoglobin 11.9, CMP with creatinine of 7.69 the patient's baseline.  She does still make urine.  RVP negative.  Troponin 73 downtrending to 70.  INR normal. Radiology: ordered.    Details:   Chest x-ray with cardiomegaly and pulmonary vascular congestion.  CT dissection study obtained given patient's onset of chest pain while in the emergency department, negative for acute vascular concern though she does have pulmonary edema and cardiomegaly.  Anasarca.    Risk Prescription drug management. Decision regarding hospitalization.  Patient will require admission for acute hypoxic respiratory failure secondary to flash pulmonary edema.  Patient acutely decompensated as above.  Please see ED course for further insight.  Improved on BiPAP, admitted to ICU under Dr. Jamison Neighbor with nephrology consulting for dialysis later this morning.  This chart was dictated using voice recognition software, Dragon. Despite the best efforts of this provider to proofread and correct errors, errors may still occur which can change documentation meaning.          Final Clinical Impression(s) / ED Diagnoses Final diagnoses:  Flash pulmonary edema (HCC)  Acute hypoxic respiratory failure [facility])    Rx / DC Orders ED Discharge Orders     None         Paris Lore, PA-C 07/07/23 0357    Glendora Score, MD 07/07/23 [phone]

## 2023-07-07 NOTE — ED Triage Notes (Signed)
Pt BIB [facility] d/t respiratory distress - has fluid in  base of bilat lungs.  Pt is a dialysis pt - she was 76 % O2  On RA when EMS arrived 100% on NRB.  Pr states she had a coughing fit and could not catch breath.

## 2023-07-08 ENCOUNTER — Other Ambulatory Visit: Payer: Self-pay

## 2023-07-08 ENCOUNTER — Other Ambulatory Visit (HOSPITAL_COMMUNITY): Payer: Self-pay

## 2023-07-08 DIAGNOSIS — I161 Hypertensive emergency: Secondary | ICD-10-CM | POA: Diagnosis not present

## 2023-07-08 LAB — CBC
HCT: 32.3 % — ABNORMAL LOW (ref 36.0–46.0)
Hemoglobin: 10.5 g/dL — ABNORMAL LOW (ref 12.0–15.0)
MCH: 24.7 pg — ABNORMAL LOW (ref 26.0–34.0)
MCHC: 32.5 g/dL (ref 30.0–36.0)
MCV: 76 fL — ABNORMAL LOW (ref 80.0–100.0)
Platelets: 183 K/uL (ref 150–400)
RBC: 4.25 MIL/uL (ref 3.87–5.11)
RDW: 15.3 % (ref 11.5–15.5)
WBC: 7.6 K/uL (ref 4.0–10.5)
nRBC: 0 % (ref 0.0–0.2)

## 2023-07-08 LAB — BASIC METABOLIC PANEL
Anion gap: 8 (ref 5–15)
BUN: 23 mg/dL — ABNORMAL HIGH (ref 6–20)
CO2: 28 mmol/L (ref 22–32)
Calcium: 8.6 mg/dL — ABNORMAL LOW (ref 8.9–10.3)
Chloride: 99 mmol/L (ref 98–111)
Creatinine, Ser: 5.11 mg/dL — ABNORMAL HIGH (ref 0.44–1.00)
GFR, Estimated: 10 mL/min — ABNORMAL LOW (ref >60)
Glucose, Bld: 92 mg/dL (ref 70–99)
Potassium: 3.8 mmol/L (ref 3.5–5.1)
Sodium: 135 mmol/L (ref 135–145)

## 2023-07-08 LAB — HEPATITIS B SURFACE ANTIBODY, QUANTITATIVE: Hep B S AB Quant (Post): 48.3 m[IU]/mL

## 2023-07-08 LAB — MAGNESIUM: Magnesium: 2.1 mg/dL (ref 1.7–2.4)

## 2023-07-08 LAB — PHOSPHORUS: Phosphorus: 3.9 mg/dL (ref 2.5–4.6)

## 2023-07-08 LAB — GLUCOSE, CAPILLARY: Glucose-Capillary: 94 mg/dL (ref 70–99)

## 2023-07-08 MED ORDER — ATORVASTATIN CALCIUM 40 MG PO TABS: 40.0000 mg | ORAL_TABLET | Freq: Every day | ORAL | 0 refills | Status: DC

## 2023-07-08 MED ORDER — CARVEDILOL 6.25 MG PO TABS: 3.1250 mg | ORAL_TABLET | Freq: Two times a day (BID) | ORAL | 0 refills | Status: DC

## 2023-07-08 MED ORDER — APIXABAN 2.5 MG PO TABS: 2.5000 mg | ORAL_TABLET | Freq: Two times a day (BID) | ORAL | 0 refills | Status: DC

## 2023-07-08 MED ORDER — AMLODIPINE BESYLATE 10 MG PO TABS: 10.0000 mg | ORAL_TABLET | Freq: Every day | ORAL | 0 refills | Status: DC

## 2023-07-08 MED ORDER — SEVELAMER CARBONATE 800 MG PO TABS
800.0000 mg | ORAL_TABLET | Freq: Three times a day (TID) | ORAL | Status: DC
Start: 2023-07-08 — End: 2023-07-08

## 2023-07-08 NOTE — Progress Notes (Addendum)
[facility] Progress Note   Subjective:    Seen and examined patient at bedside. She reports feeling much better. Tolerated yesterday's HD with net UF 4L. Now on RA. Obtained a standing weight this AM which was 61.8kg. Plan for discharge today.  Objective Vitals:   07/08/23 0044 07/08/23 0456 07/08/23 0830 07/08/23 1042  BP: 135/76 136/72 (!) 142/74   Pulse: 85 81    Resp: 19 18    Temp: 99 F (37.2 C) 98.6 F (37 C)    TempSrc: Oral Oral    SpO2: 99% 95%    Weight:  62.9 kg  61.8 kg  Height:       Physical Exam General: Awake, alert, on RA, NAD Heart: S1 and S2; No MRGs Lungs: Clear throughout Abdomen:Soft and non-tender Extremities:No LE edema Dialysis Access: [facility]   Filed Weights   07/07/23 2147 07/08/23 0456 07/08/23 1042  Weight: 62 kg 62.9 kg 61.8 kg    Intake/Output Summary (Last 24 hours) at 07/08/2023 1214 Last data filed at 07/08/2023 0830 Gross per 24 hour  Intake 120 ml  Output --  Net 120 ml    Additional Objective Labs: Basic Metabolic Panel: Recent Labs  Lab 07/07/23 0011 07/07/23 0021 07/07/23 0022 07/08/23 0352  NA 139 142 143 135  K 3.7 3.7 3.7 3.8  CL 104  --  107 99  CO2 21*  --   --  28  GLUCOSE 104*  --  105* 92  BUN 46*  --  43* 23*  CREATININE 7.69*  --  8.20* 5.11*  CALCIUM 8.7*  --   --  8.6*  PHOS  --   --   --  3.9   Liver Function Tests: Recent Labs  Lab 07/07/23 0011  AST 18  ALT 11  ALKPHOS 72  BILITOT 0.7  PROT 7.3  ALBUMIN 3.5   No results for input(s): "LIPASE", "AMYLASE" in the last 168 hours. CBC: Recent Labs  Lab 07/07/23 0011 07/07/23 0021 07/07/23 0022 07/08/23 0352  WBC 9.3  --   --  7.6  HGB 11.9* 13.6 13.6 10.5*  HCT 37.2 40.0 40.0 32.3*  MCV 77.8*  --   --  76.0*  PLT 182  --   --  183   Blood Culture    Component Value Date/Time   SDES URINE, RANDOM 11/14/2022 1902   SPECREQUEST  11/14/2022 1902    NONE Reflexed from [id number] Performed at [facility], [address], [place], [place] [address]    CULT >=100,000 COLONIES/mL ESCHERICHIA COLI (A) 11/14/2022 1902   REPTSTATUS 11/17/2022 FINAL 11/14/2022 1902   Medications:  nitroGLYCERIN Stopped (07/07/23 1125)    amLODipine  10 mg Oral Daily   atorvastatin  80 mg Oral Daily   carvedilol  6.25 mg Oral BID WC   Chlorhexidine Gluconate Cloth  6 each Topical Q0600   insulin aspart  0-6 Units Subcutaneous Q4H   sevelamer carbonate  800 mg Oral TID WC    Dialysis Orders: [facility]  4h   62.5kg   350/ 600    [facility]   Heparin none - hep B surf Ag neg on 04/16/23   Renal-related home meds: - norvasc 10 - eliquis 2.5 bid - coreg 3.125 bid - renvela 800 ac tid  CXR 10/28 - IMPRESSION: 1. Cardiomegaly with pulmonary vascular congestion. 2. Interstitial prominence bilaterally with mild airspace disease at the lung bases, possible edema or infiltrate. CT chest (my  read) --> diffuse GG changes throughout both lungs c/w edema K+ 3.7   BUN 43  creat 8.2   Assessment/Plan: Acute hypoxic resp failure - w/ pulm edema by CT and CXR. Improving significantly w/ iHD yesterday. 4 L removed. Off bipap now and on room air. Standing weight this AM was 61.8kg. Discussed with the charge HD RN at [facility] and recommended patient's EDW be lowered at next HD 10/30. ESKD - on HD w/ [facility]. Had HD 10/28. Next HD 10/30 in outpatient.  HTN'sive urgency - SBP 240 in ED. IV ntg and HD. BP's better/ improving. Resume home meds.  Volume - vol overload presumably. No sig edema on exam. See above.  Anemia of eskd - Hb 11- 13 here. No esa needs.  MBD ckd - CCa in range, add on phos.  DNR/ DNI DM2 Dispo - Patient reports feeling better after yesterday's HD. She is now on RA and remains euvolemic on exam. Obtained a standing weight today (61.8kg) and spoke with the charge RN at [facility] recommending her EDW be lowered at her next HD. Okay for discharge today from a renal standpoint.  Salome Holmes,  NP [facility] 07/08/2023,12:14 PM  LOS: 1 day    Pt seen, examined and agree w A/P as above.  Vinson Moselle MD  [facility] 07/08/2023, 1:29 PM

## 2023-07-08 NOTE — Discharge Summary (Signed)
approved or cleared by the [place] and has been authorized for detection and/or diagnosis of SARS-CoV-2 by FDA under an  Emergency Use Authorization (EUA). This EUA will remain in effect (meaning this test can be used) for the duration of the COVID-19 declaration under Section 564(b)(1) of the Act, 21 U.S.C. section 360bbb-3(b)(1), unless the authorization is terminated or revoked.  Performed at [facility], [address], [place], [place] [address]   MRSA Next Gen by PCR, Nasal     Status: None   Collection Time: 07/07/23  4:33 AM   Specimen: Nasal Mucosa; Nasal Swab  Result Value Ref Range Status   MRSA by PCR Next Gen NOT DETECTED NOT DETECTED Final    Comment: (NOTE) The GeneXpert MRSA Assay (FDA approved for NASAL specimens only), is one component of a comprehensive MRSA colonization surveillance program. It is not intended to diagnose MRSA infection nor to guide or monitor treatment for MRSA infections. Test performance is not FDA approved in patients less than 22 years old. Performed at [facility], [address] [place], [place] [address]      Labs:   Basic Metabolic Panel: Recent Labs  Lab 07/07/23 0011 07/07/23 0021 07/07/23 0022 07/08/23 0352  NA 139 142 143 135  K 3.7 3.7 3.7 3.8  CL 104  --  107 99  CO2 21*  --   --  28  GLUCOSE 104*  --  105* 92  BUN 46*  --  43* 23*  CREATININE 7.69*  --  8.20* 5.11*  CALCIUM 8.7*  --   --  8.6*  MG  --   --   --  2.1  PHOS  --   --   --  3.9   Liver Function Tests: Recent Labs  Lab 07/07/23 0011  AST 18  ALT 11  ALKPHOS 72  BILITOT 0.7  PROT 7.3  ALBUMIN 3.5    CBC: Recent Labs  Lab 07/07/23 0011 07/07/23 0021 07/07/23 0022 07/08/23 0352  WBC 9.3  --   --  7.6  HGB 11.9* 13.6 13.6 10.5*  HCT 37.2 40.0 40.0 32.3*  MCV 77.8*  --   --  76.0*  PLT 182  --   --  183     CBG: Recent Labs  Lab 07/07/23 0812 07/07/23 1216 07/07/23 1548 07/07/23 2014 07/08/23 0448  GLUCAP 96 86 75 210* 94     IMAGING STUDIES ECHOCARDIOGRAM COMPLETE  Result Date: 07/07/2023    ECHOCARDIOGRAM REPORT   Patient  Name:   Stacey Lucas Date of Exam: 07/07/2023 Medical Rec #:  [id number]    Height:       65.0 in Accession #:    [id number]   Weight:       145.5 lb Date of Birth:  12-31-1975   BSA:          1.728 m Patient Age:    47 years     BP:           160/76 mmHg Patient Gender: F            HR:           96 bpm. Exam Location:  Inpatient Procedure: 2D Echo, Cardiac Doppler, Color Doppler and Strain Analysis Indications:    Dyspnea  History:        Patient has prior history of Echocardiogram examinations, most  approved or cleared by the [place] and has been authorized for detection and/or diagnosis of SARS-CoV-2 by FDA under an  Emergency Use Authorization (EUA). This EUA will remain in effect (meaning this test can be used) for the duration of the COVID-19 declaration under Section 564(b)(1) of the Act, 21 U.S.C. section 360bbb-3(b)(1), unless the authorization is terminated or revoked.  Performed at [facility], [address], [place], [place] [address]   MRSA Next Gen by PCR, Nasal     Status: None   Collection Time: 07/07/23  4:33 AM   Specimen: Nasal Mucosa; Nasal Swab  Result Value Ref Range Status   MRSA by PCR Next Gen NOT DETECTED NOT DETECTED Final    Comment: (NOTE) The GeneXpert MRSA Assay (FDA approved for NASAL specimens only), is one component of a comprehensive MRSA colonization surveillance program. It is not intended to diagnose MRSA infection nor to guide or monitor treatment for MRSA infections. Test performance is not FDA approved in patients less than 22 years old. Performed at [facility], [address] [place], [place] [address]      Labs:   Basic Metabolic Panel: Recent Labs  Lab 07/07/23 0011 07/07/23 0021 07/07/23 0022 07/08/23 0352  NA 139 142 143 135  K 3.7 3.7 3.7 3.8  CL 104  --  107 99  CO2 21*  --   --  28  GLUCOSE 104*  --  105* 92  BUN 46*  --  43* 23*  CREATININE 7.69*  --  8.20* 5.11*  CALCIUM 8.7*  --   --  8.6*  MG  --   --   --  2.1  PHOS  --   --   --  3.9   Liver Function Tests: Recent Labs  Lab 07/07/23 0011  AST 18  ALT 11  ALKPHOS 72  BILITOT 0.7  PROT 7.3  ALBUMIN 3.5    CBC: Recent Labs  Lab 07/07/23 0011 07/07/23 0021 07/07/23 0022 07/08/23 0352  WBC 9.3  --   --  7.6  HGB 11.9* 13.6 13.6 10.5*  HCT 37.2 40.0 40.0 32.3*  MCV 77.8*  --   --  76.0*  PLT 182  --   --  183     CBG: Recent Labs  Lab 07/07/23 0812 07/07/23 1216 07/07/23 1548 07/07/23 2014 07/08/23 0448  GLUCAP 96 86 75 210* 94     IMAGING STUDIES ECHOCARDIOGRAM COMPLETE  Result Date: 07/07/2023    ECHOCARDIOGRAM REPORT   Patient  Name:   Stacey Lucas Date of Exam: 07/07/2023 Medical Rec #:  [id number]    Height:       65.0 in Accession #:    [id number]   Weight:       145.5 lb Date of Birth:  12-31-1975   BSA:          1.728 m Patient Age:    47 years     BP:           160/76 mmHg Patient Gender: F            HR:           96 bpm. Exam Location:  Inpatient Procedure: 2D Echo, Cardiac Doppler, Color Doppler and Strain Analysis Indications:    Dyspnea  History:        Patient has prior history of Echocardiogram examinations, most  [facility]  Physician Discharge Summary   Patient ID: Stacey Lucas MRN: [id number] DOB/AGE: 08-23-1976 47 y.o.  Admit date: 07/07/2023 Discharge date:   07/08/2023   PCP: Storm Frisk, MD  DISCHARGE DIAGNOSES:    Hypertensive emergency ESRD   RECOMMENDATIONS FOR OUTPATIENT FOLLOW UP: Continue with usual outpatient dialysis schedule   Home Health: None Equipment/Devices: None  CODE STATUS: DNR  DISCHARGE CONDITION: fair  Diet recommendation: As before  HOSPITAL COURSE:   47 y.o. female with a known history of CVA, diabetes mellitus, hyperlipidemia, hypertension, and end-stage renal disease on renal replacement therapy with hemodialysis Monday, Wednesday, and Friday.  Patient has a known history of substance abuse with cocaine.  Her last positive test was July 2024.  She herself denies any recent illicit drug use.  However, patient presents with acute hypoxic respiratory failure and hypertensive emergency to the emergency department this evening.   1.  Hypertensive emergency: Patient required nitroglycerin infusion.  However her blood pressures rapidly improved after she was dialyzed.  Continue with home medication regimen of carvedilol and amlodipine.  She mentions that she has been compliant with her medications.  Echocardiogram shows normal systolic function.   2.  Acute hypoxic respiratory failure: Present on admission.  Secondary to pulmonary edema as a result of hypertensive emergency.  Improved after dialysis.  CT angiogram did not show any dissection or PE.   3.  History of cocaine use:    4.  End-stage renal disease: Seen by nephrology and was dialyzed.  May resume her usual dialysis schedule from tomorrow.   5.  Type II non-STEMI: Likely due to acute pulmonary edema.  Echocardiogram shows normal systolic function without any wall motion abnormalities.     7.  Hyperlipidemia: Continuing Lipitor.   8.  Atrial fibrillation:  Continuing home Coreg.   Holding Eliquis.   9.  History of CVA:    Patient is stable.  Okay for discharge home today.    PERTINENT LABS:  The results of significant diagnostics from this hospitalization (including imaging, microbiology, ancillary and laboratory) are listed below for reference.    Microbiology: Recent Results (from the past 240 hour(s))  Resp panel by RT-PCR (RSV, Flu A&B, Covid) Anterior Nasal Swab     Status: None   Collection Time: 07/07/23 12:11 AM   Specimen: Anterior Nasal Swab  Result Value Ref Range Status   SARS Coronavirus 2 by RT PCR NEGATIVE NEGATIVE Final   Influenza A by PCR NEGATIVE NEGATIVE Final   Influenza B by PCR NEGATIVE NEGATIVE Final    Comment: (NOTE) The Xpert Xpress SARS-CoV-2/FLU/RSV plus assay is intended as an aid in the diagnosis of influenza from Nasopharyngeal swab specimens and should not be used as a sole basis for treatment. Nasal washings and aspirates are unacceptable for Xpert Xpress SARS-CoV-2/FLU/RSV testing.  Fact Sheet for Patients: BloggerCourse.com  Fact Sheet for Healthcare Providers: SeriousBroker.it  This test is not yet approved or cleared by the [place] FDA and has been authorized for detection and/or diagnosis of SARS-CoV-2 by FDA under an Emergency Use Authorization (EUA). This EUA will remain in effect (meaning this test can be used) for the duration of the COVID-19 declaration under Section 564(b)(1) of the Act, 21 U.S.C. section 360bbb-3(b)(1), unless the authorization is terminated or revoked.     Resp Syncytial Virus by PCR NEGATIVE NEGATIVE Final    Comment: (NOTE) Fact Sheet for Patients: BloggerCourse.com  Fact Sheet for Healthcare Providers: SeriousBroker.it  This test is not yet  [facility]  Physician Discharge Summary   Patient ID: Stacey Lucas MRN: [id number] DOB/AGE: 08-23-1976 47 y.o.  Admit date: 07/07/2023 Discharge date:   07/08/2023   PCP: Storm Frisk, MD  DISCHARGE DIAGNOSES:    Hypertensive emergency ESRD   RECOMMENDATIONS FOR OUTPATIENT FOLLOW UP: Continue with usual outpatient dialysis schedule   Home Health: None Equipment/Devices: None  CODE STATUS: DNR  DISCHARGE CONDITION: fair  Diet recommendation: As before  HOSPITAL COURSE:   47 y.o. female with a known history of CVA, diabetes mellitus, hyperlipidemia, hypertension, and end-stage renal disease on renal replacement therapy with hemodialysis Monday, Wednesday, and Friday.  Patient has a known history of substance abuse with cocaine.  Her last positive test was July 2024.  She herself denies any recent illicit drug use.  However, patient presents with acute hypoxic respiratory failure and hypertensive emergency to the emergency department this evening.   1.  Hypertensive emergency: Patient required nitroglycerin infusion.  However her blood pressures rapidly improved after she was dialyzed.  Continue with home medication regimen of carvedilol and amlodipine.  She mentions that she has been compliant with her medications.  Echocardiogram shows normal systolic function.   2.  Acute hypoxic respiratory failure: Present on admission.  Secondary to pulmonary edema as a result of hypertensive emergency.  Improved after dialysis.  CT angiogram did not show any dissection or PE.   3.  History of cocaine use:    4.  End-stage renal disease: Seen by nephrology and was dialyzed.  May resume her usual dialysis schedule from tomorrow.   5.  Type II non-STEMI: Likely due to acute pulmonary edema.  Echocardiogram shows normal systolic function without any wall motion abnormalities.     7.  Hyperlipidemia: Continuing Lipitor.   8.  Atrial fibrillation:  Continuing home Coreg.   Holding Eliquis.   9.  History of CVA:    Patient is stable.  Okay for discharge home today.    PERTINENT LABS:  The results of significant diagnostics from this hospitalization (including imaging, microbiology, ancillary and laboratory) are listed below for reference.    Microbiology: Recent Results (from the past 240 hour(s))  Resp panel by RT-PCR (RSV, Flu A&B, Covid) Anterior Nasal Swab     Status: None   Collection Time: 07/07/23 12:11 AM   Specimen: Anterior Nasal Swab  Result Value Ref Range Status   SARS Coronavirus 2 by RT PCR NEGATIVE NEGATIVE Final   Influenza A by PCR NEGATIVE NEGATIVE Final   Influenza B by PCR NEGATIVE NEGATIVE Final    Comment: (NOTE) The Xpert Xpress SARS-CoV-2/FLU/RSV plus assay is intended as an aid in the diagnosis of influenza from Nasopharyngeal swab specimens and should not be used as a sole basis for treatment. Nasal washings and aspirates are unacceptable for Xpert Xpress SARS-CoV-2/FLU/RSV testing.  Fact Sheet for Patients: BloggerCourse.com  Fact Sheet for Healthcare Providers: SeriousBroker.it  This test is not yet approved or cleared by the [place] FDA and has been authorized for detection and/or diagnosis of SARS-CoV-2 by FDA under an Emergency Use Authorization (EUA). This EUA will remain in effect (meaning this test can be used) for the duration of the COVID-19 declaration under Section 564(b)(1) of the Act, 21 U.S.C. section 360bbb-3(b)(1), unless the authorization is terminated or revoked.     Resp Syncytial Virus by PCR NEGATIVE NEGATIVE Final    Comment: (NOTE) Fact Sheet for Patients: BloggerCourse.com  Fact Sheet for Healthcare Providers: SeriousBroker.it  This test is not yet  [facility]  Physician Discharge Summary   Patient ID: Stacey Lucas MRN: [id number] DOB/AGE: 08-23-1976 47 y.o.  Admit date: 07/07/2023 Discharge date:   07/08/2023   PCP: Storm Frisk, MD  DISCHARGE DIAGNOSES:    Hypertensive emergency ESRD   RECOMMENDATIONS FOR OUTPATIENT FOLLOW UP: Continue with usual outpatient dialysis schedule   Home Health: None Equipment/Devices: None  CODE STATUS: DNR  DISCHARGE CONDITION: fair  Diet recommendation: As before  HOSPITAL COURSE:   47 y.o. female with a known history of CVA, diabetes mellitus, hyperlipidemia, hypertension, and end-stage renal disease on renal replacement therapy with hemodialysis Monday, Wednesday, and Friday.  Patient has a known history of substance abuse with cocaine.  Her last positive test was July 2024.  She herself denies any recent illicit drug use.  However, patient presents with acute hypoxic respiratory failure and hypertensive emergency to the emergency department this evening.   1.  Hypertensive emergency: Patient required nitroglycerin infusion.  However her blood pressures rapidly improved after she was dialyzed.  Continue with home medication regimen of carvedilol and amlodipine.  She mentions that she has been compliant with her medications.  Echocardiogram shows normal systolic function.   2.  Acute hypoxic respiratory failure: Present on admission.  Secondary to pulmonary edema as a result of hypertensive emergency.  Improved after dialysis.  CT angiogram did not show any dissection or PE.   3.  History of cocaine use:    4.  End-stage renal disease: Seen by nephrology and was dialyzed.  May resume her usual dialysis schedule from tomorrow.   5.  Type II non-STEMI: Likely due to acute pulmonary edema.  Echocardiogram shows normal systolic function without any wall motion abnormalities.     7.  Hyperlipidemia: Continuing Lipitor.   8.  Atrial fibrillation:  Continuing home Coreg.   Holding Eliquis.   9.  History of CVA:    Patient is stable.  Okay for discharge home today.    PERTINENT LABS:  The results of significant diagnostics from this hospitalization (including imaging, microbiology, ancillary and laboratory) are listed below for reference.    Microbiology: Recent Results (from the past 240 hour(s))  Resp panel by RT-PCR (RSV, Flu A&B, Covid) Anterior Nasal Swab     Status: None   Collection Time: 07/07/23 12:11 AM   Specimen: Anterior Nasal Swab  Result Value Ref Range Status   SARS Coronavirus 2 by RT PCR NEGATIVE NEGATIVE Final   Influenza A by PCR NEGATIVE NEGATIVE Final   Influenza B by PCR NEGATIVE NEGATIVE Final    Comment: (NOTE) The Xpert Xpress SARS-CoV-2/FLU/RSV plus assay is intended as an aid in the diagnosis of influenza from Nasopharyngeal swab specimens and should not be used as a sole basis for treatment. Nasal washings and aspirates are unacceptable for Xpert Xpress SARS-CoV-2/FLU/RSV testing.  Fact Sheet for Patients: BloggerCourse.com  Fact Sheet for Healthcare Providers: SeriousBroker.it  This test is not yet approved or cleared by the [place] FDA and has been authorized for detection and/or diagnosis of SARS-CoV-2 by FDA under an Emergency Use Authorization (EUA). This EUA will remain in effect (meaning this test can be used) for the duration of the COVID-19 declaration under Section 564(b)(1) of the Act, 21 U.S.C. section 360bbb-3(b)(1), unless the authorization is terminated or revoked.     Resp Syncytial Virus by PCR NEGATIVE NEGATIVE Final    Comment: (NOTE) Fact Sheet for Patients: BloggerCourse.com  Fact Sheet for Healthcare Providers: SeriousBroker.it  This test is not yet  approved or cleared by the [place] and has been authorized for detection and/or diagnosis of SARS-CoV-2 by FDA under an  Emergency Use Authorization (EUA). This EUA will remain in effect (meaning this test can be used) for the duration of the COVID-19 declaration under Section 564(b)(1) of the Act, 21 U.S.C. section 360bbb-3(b)(1), unless the authorization is terminated or revoked.  Performed at [facility], [address], [place], [place] [address]   MRSA Next Gen by PCR, Nasal     Status: None   Collection Time: 07/07/23  4:33 AM   Specimen: Nasal Mucosa; Nasal Swab  Result Value Ref Range Status   MRSA by PCR Next Gen NOT DETECTED NOT DETECTED Final    Comment: (NOTE) The GeneXpert MRSA Assay (FDA approved for NASAL specimens only), is one component of a comprehensive MRSA colonization surveillance program. It is not intended to diagnose MRSA infection nor to guide or monitor treatment for MRSA infections. Test performance is not FDA approved in patients less than 22 years old. Performed at [facility], [address] [place], [place] [address]      Labs:   Basic Metabolic Panel: Recent Labs  Lab 07/07/23 0011 07/07/23 0021 07/07/23 0022 07/08/23 0352  NA 139 142 143 135  K 3.7 3.7 3.7 3.8  CL 104  --  107 99  CO2 21*  --   --  28  GLUCOSE 104*  --  105* 92  BUN 46*  --  43* 23*  CREATININE 7.69*  --  8.20* 5.11*  CALCIUM 8.7*  --   --  8.6*  MG  --   --   --  2.1  PHOS  --   --   --  3.9   Liver Function Tests: Recent Labs  Lab 07/07/23 0011  AST 18  ALT 11  ALKPHOS 72  BILITOT 0.7  PROT 7.3  ALBUMIN 3.5    CBC: Recent Labs  Lab 07/07/23 0011 07/07/23 0021 07/07/23 0022 07/08/23 0352  WBC 9.3  --   --  7.6  HGB 11.9* 13.6 13.6 10.5*  HCT 37.2 40.0 40.0 32.3*  MCV 77.8*  --   --  76.0*  PLT 182  --   --  183     CBG: Recent Labs  Lab 07/07/23 0812 07/07/23 1216 07/07/23 1548 07/07/23 2014 07/08/23 0448  GLUCAP 96 86 75 210* 94     IMAGING STUDIES ECHOCARDIOGRAM COMPLETE  Result Date: 07/07/2023    ECHOCARDIOGRAM REPORT   Patient  Name:   Stacey Lucas Date of Exam: 07/07/2023 Medical Rec #:  [id number]    Height:       65.0 in Accession #:    [id number]   Weight:       145.5 lb Date of Birth:  12-31-1975   BSA:          1.728 m Patient Age:    47 years     BP:           160/76 mmHg Patient Gender: F            HR:           96 bpm. Exam Location:  Inpatient Procedure: 2D Echo, Cardiac Doppler, Color Doppler and Strain Analysis Indications:    Dyspnea  History:        Patient has prior history of Echocardiogram examinations, most  approved or cleared by the [place] and has been authorized for detection and/or diagnosis of SARS-CoV-2 by FDA under an  Emergency Use Authorization (EUA). This EUA will remain in effect (meaning this test can be used) for the duration of the COVID-19 declaration under Section 564(b)(1) of the Act, 21 U.S.C. section 360bbb-3(b)(1), unless the authorization is terminated or revoked.  Performed at [facility], [address], [place], [place] [address]   MRSA Next Gen by PCR, Nasal     Status: None   Collection Time: 07/07/23  4:33 AM   Specimen: Nasal Mucosa; Nasal Swab  Result Value Ref Range Status   MRSA by PCR Next Gen NOT DETECTED NOT DETECTED Final    Comment: (NOTE) The GeneXpert MRSA Assay (FDA approved for NASAL specimens only), is one component of a comprehensive MRSA colonization surveillance program. It is not intended to diagnose MRSA infection nor to guide or monitor treatment for MRSA infections. Test performance is not FDA approved in patients less than 22 years old. Performed at [facility], [address] [place], [place] [address]      Labs:   Basic Metabolic Panel: Recent Labs  Lab 07/07/23 0011 07/07/23 0021 07/07/23 0022 07/08/23 0352  NA 139 142 143 135  K 3.7 3.7 3.7 3.8  CL 104  --  107 99  CO2 21*  --   --  28  GLUCOSE 104*  --  105* 92  BUN 46*  --  43* 23*  CREATININE 7.69*  --  8.20* 5.11*  CALCIUM 8.7*  --   --  8.6*  MG  --   --   --  2.1  PHOS  --   --   --  3.9   Liver Function Tests: Recent Labs  Lab 07/07/23 0011  AST 18  ALT 11  ALKPHOS 72  BILITOT 0.7  PROT 7.3  ALBUMIN 3.5    CBC: Recent Labs  Lab 07/07/23 0011 07/07/23 0021 07/07/23 0022 07/08/23 0352  WBC 9.3  --   --  7.6  HGB 11.9* 13.6 13.6 10.5*  HCT 37.2 40.0 40.0 32.3*  MCV 77.8*  --   --  76.0*  PLT 182  --   --  183     CBG: Recent Labs  Lab 07/07/23 0812 07/07/23 1216 07/07/23 1548 07/07/23 2014 07/08/23 0448  GLUCAP 96 86 75 210* 94     IMAGING STUDIES ECHOCARDIOGRAM COMPLETE  Result Date: 07/07/2023    ECHOCARDIOGRAM REPORT   Patient  Name:   Stacey Lucas Date of Exam: 07/07/2023 Medical Rec #:  [id number]    Height:       65.0 in Accession #:    [id number]   Weight:       145.5 lb Date of Birth:  12-31-1975   BSA:          1.728 m Patient Age:    47 years     BP:           160/76 mmHg Patient Gender: F            HR:           96 bpm. Exam Location:  Inpatient Procedure: 2D Echo, Cardiac Doppler, Color Doppler and Strain Analysis Indications:    Dyspnea  History:        Patient has prior history of Echocardiogram examinations, most

## 2023-07-08 NOTE — Progress Notes (Addendum)
D/C order noted. Contacted [facility] to advise clinic of pt's d/c today and that pt should resume care tomorrow. Clinic has access to [facility] epic to obtain needed clinicals.   Olivia Canter Renal Navigator [phone]

## 2023-07-08 NOTE — TOC Transition Note (Signed)
Transition of Care [facility]) - CM/SW Discharge Note   Patient Details  Name: Stacey Lucas MRN: [id number] Date of Birth: [birth year]  Transition of Care [facility]) CM/SW Contact:  Leone Haven, RN Phone Number: 07/08/2023, 10:54 AM   Clinical Narrative:    For dc today, This NCM gave patient  SA resources.  She states she is calling her transport to take her home.      Barriers to Discharge: Continued Medical Work up   Patient Goals and CMS Choice      Discharge Placement                         Discharge Plan and Services Additional resources added to the After Visit Summary for     Discharge Planning Services: CM Consult                                 Social Determinants of Health (SDOH) Interventions SDOH Screenings   Food Insecurity: No Food Insecurity (03/15/2023)  Housing: Low Risk  (03/14/2023)  Transportation Needs: Unmet Transportation Needs (03/16/2023)  Utilities: At Risk (03/16/2023)  Alcohol Screen: Low Risk  (04/25/2018)  Depression (PHQ2-9): Low Risk  (02/04/2023)  Recent Concern: Depression (PHQ2-9) - Medium Risk (12/03/2022)  Social Connections: Unknown (01/12/2022)   Received from [facility]  Stress: Stress Concern Present (03/20/2023)  Tobacco Use: High Risk (07/07/2023)     Readmission Risk Interventions     No data to display

## 2023-07-08 NOTE — Progress Notes (Signed)
Patient discharged home via wheelchair in stable condition.

## 2023-07-08 NOTE — Progress Notes (Signed)
Pt not in distress at this time. Bipap not needed

## 2023-07-09 ENCOUNTER — Encounter: Payer: Self-pay | Admitting: Pharmacist

## 2023-07-09 ENCOUNTER — Telehealth: Payer: Self-pay

## 2023-07-09 ENCOUNTER — Other Ambulatory Visit: Payer: Self-pay

## 2023-07-09 DIAGNOSIS — Z418 Encounter for other procedures for purposes other than remedying health state: Secondary | ICD-10-CM | POA: Diagnosis not present

## 2023-07-09 DIAGNOSIS — N186 End stage renal disease: Secondary | ICD-10-CM | POA: Diagnosis not present

## 2023-07-09 NOTE — Transitions of Care (Post Inpatient/ED Visit) (Signed)
07/09/2023  Name: Ozie Medvedev MRN: [id number] DOB: June 13, 1976  Today's TOC FU Call Status: Today's TOC FU Call Status:: Unsuccessful Call (1st Attempt) Unsuccessful Call (1st Attempt) Date: 07/09/23  Attempted to reach the patient regarding the most recent Inpatient/ED visit.  Follow Up Plan: Additional outreach attempts will be made to reach the patient to complete the Transitions of Care (Post Inpatient/ED visit) call.   Alyse Low, RN, BA, [facility], CRRN [facility] Coordinator, Transition of Care Ph # [phone]

## 2023-07-10 ENCOUNTER — Other Ambulatory Visit: Payer: Self-pay

## 2023-07-10 ENCOUNTER — Inpatient Hospital Stay: Payer: 59 | Admitting: Physician Assistant

## 2023-07-10 ENCOUNTER — Telehealth: Payer: Self-pay

## 2023-07-10 DIAGNOSIS — Z992 Dependence on renal dialysis: Secondary | ICD-10-CM | POA: Diagnosis not present

## 2023-07-10 DIAGNOSIS — N186 End stage renal disease: Secondary | ICD-10-CM | POA: Diagnosis not present

## 2023-07-10 NOTE — Progress Notes (Deleted)
Patient ID: Star Primer, female   DOB: 1975/11/22, 47 y.o.   MRN: [id number]  After hospitalization 10/28 to 07/08/2023 HOSPITAL COURSE:    47 y.o. female with a known history of CVA, diabetes mellitus, hyperlipidemia, hypertension, and end-stage renal disease on renal replacement therapy with hemodialysis Monday, Wednesday, and Friday.  Patient has a known history of substance abuse with cocaine.  Her last positive test was July 2024.  She herself denies any recent illicit drug use.  However, patient presents with acute hypoxic respiratory failure and hypertensive emergency to the emergency department this evening.   1.  Hypertensive emergency: Patient required nitroglycerin infusion.  However her blood pressures rapidly improved after she was dialyzed.  Continue with home medication regimen of carvedilol and amlodipine.  She mentions that she has been compliant with her medications.  Echocardiogram shows normal systolic function.   2.  Acute hypoxic respiratory failure: Present on admission.  Secondary to pulmonary edema as a result of hypertensive emergency.  Improved after dialysis.  CT angiogram did not show any dissection or PE.   3.  History of cocaine use:    4.  End-stage renal disease: Seen by nephrology and was dialyzed.  May resume her usual dialysis schedule from tomorrow.   5.  Type II non-STEMI: Likely due to acute pulmonary edema.  Echocardiogram shows normal systolic function without any wall motion abnormalities.     7.  Hyperlipidemia: Continuing Lipitor.   8.  Atrial fibrillation:  Continuing home Coreg.  Holding Eliquis.   9.  History of CVA:

## 2023-07-10 NOTE — Transitions of Care (Post Inpatient/ED Visit) (Signed)
07/10/2023  Name: Stacey Lucas MRN: [id number] DOB: 01-09-1976  Today's TOC FU Call Status:    Attempted to reach the patient regarding the most recent Inpatient/ED visit.  Follow Up Plan: Additional outreach attempts will be made to reach the patient to complete the Transitions of Care (Post Inpatient/ED visit) call.   Alyse Low, RN, BA, [facility] CRRN [facility] Coordinator, Transition of Care Ph # [phone]

## 2023-07-11 ENCOUNTER — Telehealth: Payer: Self-pay

## 2023-07-11 NOTE — Transitions of Care (Post Inpatient/ED Visit) (Signed)
   07/11/2023  Name: Stacey Lucas MRN: [id number] DOB: April 24, 1976  Today's TOC FU Call Status: Today's TOC FU Call Status:: Unsuccessful Call (3rd Attempt) Unsuccessful Call (3rd Attempt) Date: 07/11/23  Attempted to reach the patient regarding the most recent Inpatient/ED visit.  Follow Up Plan: No further outreach attempts will be made at this time. We have been unable to contact the patient.  Alyse Low, RN, BA, [facility], CRRN [facility] Coordinator, Transition of Care Ph # [phone]

## 2023-07-13 ENCOUNTER — Emergency Department (HOSPITAL_COMMUNITY): Payer: 59

## 2023-07-13 ENCOUNTER — Emergency Department (HOSPITAL_COMMUNITY)
Admission: EM | Admit: 2023-07-13 | Discharge: 2023-07-13 | Disposition: A | Discharge status: Home / Self Care | Payer: 59 | Attending: Emergency Medicine | Admitting: Emergency Medicine

## 2023-07-13 ENCOUNTER — Other Ambulatory Visit: Payer: Self-pay

## 2023-07-13 DIAGNOSIS — N186 End stage renal disease: Secondary | ICD-10-CM | POA: Diagnosis not present

## 2023-07-13 DIAGNOSIS — I499 Cardiac arrhythmia, unspecified: Secondary | ICD-10-CM | POA: Diagnosis not present

## 2023-07-13 DIAGNOSIS — Z992 Dependence on renal dialysis: Secondary | ICD-10-CM | POA: Diagnosis not present

## 2023-07-13 DIAGNOSIS — J810 Acute pulmonary edema: Secondary | ICD-10-CM | POA: Insufficient documentation

## 2023-07-13 DIAGNOSIS — Z7901 Long term (current) use of anticoagulants: Secondary | ICD-10-CM | POA: Insufficient documentation

## 2023-07-13 DIAGNOSIS — I517 Cardiomegaly: Secondary | ICD-10-CM | POA: Diagnosis not present

## 2023-07-13 DIAGNOSIS — R0902 Hypoxemia: Secondary | ICD-10-CM | POA: Diagnosis not present

## 2023-07-13 DIAGNOSIS — R0602 Shortness of breath: Secondary | ICD-10-CM | POA: Diagnosis not present

## 2023-07-13 DIAGNOSIS — I10 Essential (primary) hypertension: Secondary | ICD-10-CM | POA: Diagnosis not present

## 2023-07-13 DIAGNOSIS — J811 Chronic pulmonary edema: Secondary | ICD-10-CM | POA: Diagnosis not present

## 2023-07-13 LAB — CBC WITH DIFFERENTIAL/PLATELET
Abs Immature Granulocytes: 0.03 10*3/uL (ref 0.00–0.07)
Basophils Absolute: 0.1 10*3/uL (ref 0.0–0.1)
Basophils Relative: 1 %
Eosinophils Absolute: 0.4 10*3/uL (ref 0.0–0.5)
Eosinophils Relative: 5 %
HCT: 33.7 % — ABNORMAL LOW (ref 36.0–46.0)
Hemoglobin: 11 g/dL — ABNORMAL LOW (ref 12.0–15.0)
Immature Granulocytes: 0 %
Lymphocytes Relative: 28 %
Lymphs Abs: 2.5 10*3/uL (ref 0.7–4.0)
MCH: 25.1 pg — ABNORMAL LOW (ref 26.0–34.0)
MCHC: 32.6 g/dL (ref 30.0–36.0)
MCV: 76.8 fL — ABNORMAL LOW (ref 80.0–100.0)
Monocytes Absolute: 0.6 10*3/uL (ref 0.1–1.0)
Monocytes Relative: 7 %
Neutro Abs: 5.4 10*3/uL (ref 1.7–7.7)
Neutrophils Relative %: 59 %
Platelets: 268 10*3/uL (ref 150–400)
RBC: 4.39 MIL/uL (ref 3.87–5.11)
RDW: 15.5 % (ref 11.5–15.5)
WBC: 9 10*3/uL (ref 4.0–10.5)
nRBC: 0 % (ref 0.0–0.2)

## 2023-07-13 LAB — BASIC METABOLIC PANEL
Anion gap: 13 (ref 5–15)
BUN: 71 mg/dL — ABNORMAL HIGH (ref 6–20)
CO2: 21 mmol/L — ABNORMAL LOW (ref 22–32)
Calcium: 8.3 mg/dL — ABNORMAL LOW (ref 8.9–10.3)
Chloride: 102 mmol/L (ref 98–111)
Creatinine, Ser: 10.42 mg/dL — ABNORMAL HIGH (ref 0.44–1.00)
GFR, Estimated: 4 mL/min — ABNORMAL LOW (ref >60)
Glucose, Bld: 125 mg/dL — ABNORMAL HIGH (ref 70–99)
Potassium: 4.7 mmol/L (ref 3.5–5.1)
Sodium: 136 mmol/L (ref 135–145)

## 2023-07-13 LAB — BRAIN NATRIURETIC PEPTIDE: B Natriuretic Peptide: 919.2 pg/mL — ABNORMAL HIGH (ref 0.0–100.0)

## 2023-07-13 LAB — TROPONIN I (HIGH SENSITIVITY): Troponin I (High Sensitivity): 41 ng/L — ABNORMAL HIGH (ref <18)

## 2023-07-13 MED ORDER — ALTEPLASE 2 MG IJ SOLR: 2.0000 mg | Freq: Once | INTRAMUSCULAR | Status: DC | PRN

## 2023-07-13 MED ORDER — CHLORHEXIDINE GLUCONATE CLOTH 2 % EX PADS: 6.0000 | MEDICATED_PAD | Freq: Every day | CUTANEOUS | Status: DC

## 2023-07-13 MED ORDER — HEPARIN SODIUM (PORCINE) 1000 UNIT/ML DIALYSIS
1000.0000 [IU] | INTRAMUSCULAR | Status: DC | PRN
  Administered 2023-07-13: 3200 [IU]
  Filled 2023-07-13 (×2): qty 1

## 2023-07-13 MED ORDER — ANTICOAGULANT SODIUM CITRATE 4% (200MG/5ML) IV SOLN
5.0000 mL | Status: DC | PRN
  Filled 2023-07-13: qty 5

## 2023-07-13 MED ORDER — PENTAFLUOROPROP-TETRAFLUOROETH EX AERO: 1.0000 | INHALATION_SPRAY | CUTANEOUS | Status: DC | PRN

## 2023-07-13 MED ORDER — LIDOCAINE-PRILOCAINE 2.5-2.5 % EX CREA
1.0000 | TOPICAL_CREAM | CUTANEOUS | Status: DC | PRN
Start: 2023-07-13 — End: 2023-07-13

## 2023-07-13 MED ORDER — LIDOCAINE HCL (PF) 1 % IJ SOLN: 5.0000 mL | INTRAMUSCULAR | Status: DC | PRN

## 2023-07-13 MED ORDER — NEPRO/CARBSTEADY PO LIQD
237.0000 mL | ORAL | Status: DC | PRN
  Filled 2023-07-13: qty 237

## 2023-07-13 NOTE — ED Notes (Signed)
Pt back from dialysis, A&Ox3, graham crackers and soft drink provided.

## 2023-07-13 NOTE — ED Triage Notes (Signed)
Patient BIB EMS from home. States that patient has been having increased SOB. Noted more at night time when laying down. When laying down oxygen levels are down in the 80s and when sitting up oxygen stats are on the upper 90s with no distress. Rhonchi noted in left mung but right side is clear. Patient did miss dialysis on Friday.

## 2023-07-13 NOTE — Progress Notes (Signed)
Asked to see for dialysis. Pt presented w/ SOB and pulm edema on CXR.  Missed HD yesterday due to feeling "bad". Had to sleep in living room last night w/ head propped up. On HD x 4 mos, goes to HD w/ [facility]. States she has good compliance and is not drinking too much.  She may be losing body wt. Exam shows bilat basilar crackles and normal heart sounds, no LE edema, and R internal jugular TDC.  The plan will be for "ED HD". Patient is not being admitted. Pt will go to the dialysis unit upstairs when they are ready for the pt. Then the pt will get dialysis. When dialysis is completed pt will be sent back to ED for reassessment.   Vinson Moselle  MD  [facility] 07/13/2023, 9:39 AM  Recent Labs  Lab 07/07/23 0011 07/07/23 0021 07/08/23 0352 07/13/23 0406  HGB 11.9*   < > 10.5* 11.0*  ALBUMIN 3.5  --   --   --   CALCIUM 8.7*  --  8.6* 8.3*  PHOS  --   --  3.9  --   CREATININE 7.69*   < > 5.11* 10.42*  K 3.7   < > 3.8 4.7   < > = values in this interval not displayed.    Inpatient medications:  Chlorhexidine Gluconate Cloth  6 each Topical Q0600    anticoagulant sodium citrate     alteplase, anticoagulant sodium citrate, feeding supplement (NEPRO CARB STEADY), heparin, lidocaine (PF), lidocaine-prilocaine, pentafluoroprop-tetrafluoroeth

## 2023-07-13 NOTE — ED Notes (Signed)
Attempted twice for IV access. Both Ivs infiltrated. Tried in hand and left ac.

## 2023-07-13 NOTE — Procedures (Signed)
I have reviewed the HD regimen and made appropriate changes.  Vinson Moselle MD  [facility] 07/13/2023, 1:25 PM

## 2023-07-13 NOTE — ED Provider Notes (Addendum)
Signed out by Dr Adela Lank  that pt is just awaiting dialysis as missed her last dialysis.   Pt alert, content, no acute distress, appears to be breathing comfortably.  Renal is aware of patient and is facilitating dialysis.      Cathren Laine, MD 07/13/23 [phone]   Signed out to Dr Silverio Lay to reassess post HD.      Cathren Laine, MD 07/13/23 [phone]

## 2023-07-13 NOTE — ED Provider Notes (Signed)
  Physical Exam  BP (!) 194/89 (BP Location: Right Arm)   Pulse 86   Temp 97.7 F (36.5 C) (Oral)   Resp 20   LMP 10/11/2019   SpO2 99%   Physical Exam  Procedures  Procedures  ED Course / MDM    Medical Decision Making Patient came back from dialysis around 3:30 pm.  Patient states that she is feeling better.  Patient has minimal crackles on exam.  Oxygen saturation is 99%.  Patient is stable for discharge.  Problems Addressed: Acute pulmonary edema (HCC): acute illness or injury ESRD (end stage renal disease) on dialysis [facility]): acute illness or injury  Amount and/or Complexity of Data Reviewed Labs: ordered. Radiology: ordered and independent interpretation performed. Decision-making details documented in ED Course.          Charlynne Pander, MD 07/13/23 [phone]

## 2023-07-13 NOTE — Progress Notes (Signed)
Received patient in bed to unit.  Alert and oriented.  Informed consent signed and in chart.   TX duration:3:09  Patient tolerated well.  Transported back to the room  Alert, without acute distress.  Hand-off given to patient's nurse.   Access used: right [facility] Access issues: alarmed throughout tx  Total UF removed: 3L Medication(s) given: none   07/13/23 1354  Vitals  Temp 97.7 F (36.5 C)  Temp Source Oral  BP (!) 180/93  MAP (mmHg) 116  BP Location Right Arm  BP Method Automatic  Patient Position (if appropriate) Lying  Pulse Rate 86  Pulse Rate Source Monitor  ECG Heart Rate 87  Resp 15  Oxygen Therapy  SpO2 100 %  O2 Device Room Air  During Treatment Monitoring  Blood Flow Rate (mL/min) 299 mL/min  Arterial Pressure (mmHg) -495.33 mmHg  Venous Pressure (mmHg) 27.67 mmHg  TMP (mmHg) 33.53 mmHg  Ultrafiltration Rate (mL/min) 2000 mL/min  Dialysate Flow Rate (mL/min) 300 ml/min  Dialysate Potassium Concentration 3  Dialysate Calcium Concentration 2.5  Duration of HD Treatment -hour(s) 3.15 hour(s)  Cumulative Fluid Removed (mL) per Treatment  3094.05  HD Safety Checks Performed Yes  Intra-Hemodialysis Comments  (tx terminated with 6 minutes left due to cartridge clotting, pt blood was returned)  Dialysis Fluid Bolus Normal Saline  Bolus Amount (mL) 300 mL      Jackelin Correia [facility]

## 2023-07-13 NOTE — ED Provider Notes (Signed)
[place] EMERGENCY DEPARTMENT AT [facility] Provider Note   CSN: [id number] Arrival date & time: 07/13/23  0343     History  Chief Complaint  Patient presents with   Shortness of Breath    Stacey Lucas is a 47 y.o. female.  47 yo F with a chief complaints of difficulty breathing.  She has been struggling with lying back flat to sleep.  She was just in the hospital for acute pulmonary edema.  Improved with dialysis and she was discharged home.  She said she did miss dialysis on Friday because she felt she did not sleep well on Thursday and felt her breathing had worsened.  She denies cough or congestion.  Denies chest pain.   Shortness of Breath      Home Medications Prior to Admission medications   Medication Sig Start Date End Date Taking? Authorizing Provider  amLODipine (NORVASC) 10 MG tablet Take 1 tablet (10 mg total) by mouth daily. 07/08/23   Osvaldo Shipper, MD  apixaban (ELIQUIS) 2.5 MG TABS tablet Take 1 tablet (2.5 mg total) by mouth 2 (two) times daily. 07/08/23   Osvaldo Shipper, MD  atorvastatin (LIPITOR) 40 MG tablet Take 1 tablet (40 mg total) by mouth daily. 07/09/23   Osvaldo Shipper, MD  azelastine (OPTIVAR) 0.05 % ophthalmic solution Place 1 drop into both eyes 2 (two) times daily. Patient not taking: Reported on 03/13/2023 08/21/22   Waldon Merl, PA-C  carvedilol (COREG) 6.25 MG tablet Take 0.5 tablets (3.125 mg total) by mouth 2 (two) times daily with a meal. 07/08/23 08/07/23  Osvaldo Shipper, MD  sevelamer carbonate (RENVELA) 800 MG tablet Take 800 mg by mouth daily as needed. 05/15/23 05/14/24  [provider]      Allergies    Neurontin [gabapentin]    Review of Systems   Review of Systems  Respiratory:  Positive for shortness of breath.     Physical Exam Updated Vital Signs BP (!) 179/91   Pulse 88   Temp 98.3 F (36.8 C) (Oral)   Resp 14   LMP 10/11/2019   SpO2 95%  Physical Exam Vitals and nursing note reviewed.   Constitutional:      General: She is not in acute distress.    Appearance: She is well-developed. She is not diaphoretic.  HENT:     Head: Normocephalic and atraumatic.  Eyes:     Pupils: Pupils are equal, round, and reactive to light.  Cardiovascular:     Rate and Rhythm: Normal rate and regular rhythm.     Heart sounds: No murmur heard.    No friction rub. No gallop.  Pulmonary:     Effort: Pulmonary effort is normal.     Breath sounds: No wheezing or rales.  Abdominal:     General: There is no distension.     Palpations: Abdomen is soft.     Tenderness: There is no abdominal tenderness.  Musculoskeletal:        General: No tenderness.     Cervical back: Normal range of motion and neck supple.  Skin:    General: Skin is warm and dry.  Neurological:     Mental Status: She is alert and oriented to person, place, and time.  Psychiatric:        Behavior: Behavior normal.     ED Results / Procedures / Treatments   Labs (all labs ordered are listed, but only abnormal results are displayed) Labs Reviewed  CBC WITH DIFFERENTIAL/PLATELET -  Abnormal; Notable for the following components:      Result Value   Hemoglobin 11.0 (*)    HCT 33.7 (*)    MCV 76.8 (*)    MCH 25.1 (*)    All other components within normal limits  BASIC METABOLIC PANEL - Abnormal; Notable for the following components:   CO2 21 (*)    Glucose, Bld 125 (*)    BUN 71 (*)    Creatinine, Ser 10.42 (*)    Calcium 8.3 (*)    GFR, Estimated 4 (*)    All other components within normal limits  BRAIN NATRIURETIC PEPTIDE - Abnormal; Notable for the following components:   B Natriuretic Peptide 919.2 (*)    All other components within normal limits  TROPONIN I (HIGH SENSITIVITY) - Abnormal; Notable for the following components:   Troponin I (High Sensitivity) 41 (*)    All other components within normal limits    EKG EKG Interpretation Date/Time:  Sunday July 13 2023 03:53:28 EST Ventricular Rate:   94 PR Interval:  184 QRS Duration:  94 QT Interval:  414 QTC Calculation: 518 R Axis:   -49  Text Interpretation: Sinus rhythm Probable left atrial enlargement Left anterior fascicular block LVH with secondary repolarization abnormality Prolonged QT interval flipped t waves laterally seen on prior No significant change since last tracing Confirmed by Melene Plan [phone]) on 07/13/2023 4:18:50 AM  Radiology DG Chest Port 1 View  Result Date: 07/13/2023 CLINICAL DATA:  Shortness of breath EXAM: PORTABLE CHEST 1 VIEW COMPARISON:  07/07/2023 FINDINGS: Dialysis catheter on the right with tip at the upper cavoatrial junction. Chronic cardiomegaly with stable mediastinal contours. Diffuse hazy and interstitial pulmonary opacification. No pleural fluid or air leak is seen. IMPRESSION: Cardiomegaly and pulmonary edema. Electronically Signed   By: Tiburcio Pea M.D.   On: 07/13/2023 04:32    Procedures .Critical Care  Performed by: Melene Plan, DO Authorized by: Melene Plan, DO   Critical care provider statement:    Critical care time (minutes):  35   Critical care time was exclusive of:  Separately billable procedures and treating other patients   Critical care was time spent personally by me on the following activities:  Development of treatment plan with patient or surrogate, discussions with consultants, evaluation of patient's response to treatment, examination of patient, ordering and review of laboratory studies, ordering and review of radiographic studies, ordering and performing treatments and interventions, pulse oximetry, re-evaluation of patient's condition and review of old charts   Care discussed with: admitting provider       Medications Ordered in ED Medications - No data to display  ED Course/ Medical Decision Making/ A&P                                 Medical Decision Making Amount and/or Complexity of Data Reviewed Labs: ordered. Radiology: ordered.   47 yo F with a  chief complaints of difficulty breathing.  Patient was just in the hospital for acute pulmonary edema and she missed dialysis on Friday because she said she did not feel well enough to go.  Not requiring oxygen here.  Not tachypneic.  Clear lung sounds for me on exam.  Will obtain a laboratory evaluation chest x-ray reassess.  Patient's chest x-ray with some increased interstitial markings concerning for fluid overload.  BNP is elevated above baseline.  BUN is elevated above baseline 70.  Potassium is  normal, no significant acidosis.  No significant anemia.  I discussed case with Dr. Arlean Hopping, will evaluate for urgent dialysis.  The patients results and plan were reviewed and discussed.   Any x-rays performed were independently reviewed by myself.   Differential diagnosis were considered with the presenting HPI.  Medications - No data to display  Vitals:   07/13/23 0445 07/13/23 0500 07/13/23 0512 07/13/23 0600  BP: (!) 185/96 (!) 194/100  (!) 179/91  Pulse: 93   88  Resp: (!) 22 (!) 25  14  Temp:   98.3 F (36.8 C)   TempSrc:   Oral   SpO2: 95%   95%    Final diagnoses:  Acute pulmonary edema (HCC)    Admission/ observation were discussed with the admitting physician, patient and/or family and they are comfortable with the plan.          Final Clinical Impression(s) / ED Diagnoses Final diagnoses:  Acute pulmonary edema [facility])    Rx / DC Orders ED Discharge Orders     None         Melene Plan, DO 07/13/23 7829

## 2023-07-13 NOTE — Discharge Instructions (Signed)
Please continue taking your meds as prescribed by your doctor  Please go to dialysis as scheduled  See your doctor for follow-up  Return to ER if you have worse shortness of breath or chest pain

## 2023-07-14 DIAGNOSIS — N186 End stage renal disease: Secondary | ICD-10-CM | POA: Diagnosis not present

## 2023-07-14 DIAGNOSIS — D631 Anemia in chronic kidney disease: Secondary | ICD-10-CM | POA: Diagnosis not present

## 2023-07-14 DIAGNOSIS — N2581 Secondary hyperparathyroidism of renal origin: Secondary | ICD-10-CM | POA: Diagnosis not present

## 2023-07-14 DIAGNOSIS — Z418 Encounter for other procedures for purposes other than remedying health state: Secondary | ICD-10-CM | POA: Diagnosis not present

## 2023-07-14 DIAGNOSIS — D509 Iron deficiency anemia, unspecified: Secondary | ICD-10-CM | POA: Diagnosis not present

## 2023-07-16 DIAGNOSIS — N2581 Secondary hyperparathyroidism of renal origin: Secondary | ICD-10-CM | POA: Diagnosis not present

## 2023-07-16 DIAGNOSIS — Z418 Encounter for other procedures for purposes other than remedying health state: Secondary | ICD-10-CM | POA: Diagnosis not present

## 2023-07-16 DIAGNOSIS — E1121 Type 2 diabetes mellitus with diabetic nephropathy: Secondary | ICD-10-CM | POA: Diagnosis not present

## 2023-07-16 DIAGNOSIS — D631 Anemia in chronic kidney disease: Secondary | ICD-10-CM | POA: Diagnosis not present

## 2023-07-16 DIAGNOSIS — D509 Iron deficiency anemia, unspecified: Secondary | ICD-10-CM | POA: Diagnosis not present

## 2023-07-16 DIAGNOSIS — N186 End stage renal disease: Secondary | ICD-10-CM | POA: Diagnosis not present

## 2023-07-18 DIAGNOSIS — N2581 Secondary hyperparathyroidism of renal origin: Secondary | ICD-10-CM | POA: Diagnosis not present

## 2023-07-18 DIAGNOSIS — Z418 Encounter for other procedures for purposes other than remedying health state: Secondary | ICD-10-CM | POA: Diagnosis not present

## 2023-07-18 DIAGNOSIS — N186 End stage renal disease: Secondary | ICD-10-CM | POA: Diagnosis not present

## 2023-07-18 DIAGNOSIS — D509 Iron deficiency anemia, unspecified: Secondary | ICD-10-CM | POA: Diagnosis not present

## 2023-07-18 DIAGNOSIS — D631 Anemia in chronic kidney disease: Secondary | ICD-10-CM | POA: Diagnosis not present

## 2023-07-21 DIAGNOSIS — N186 End stage renal disease: Secondary | ICD-10-CM | POA: Diagnosis not present

## 2023-07-21 DIAGNOSIS — Z418 Encounter for other procedures for purposes other than remedying health state: Secondary | ICD-10-CM | POA: Diagnosis not present

## 2023-07-21 DIAGNOSIS — N2581 Secondary hyperparathyroidism of renal origin: Secondary | ICD-10-CM | POA: Diagnosis not present

## 2023-07-21 DIAGNOSIS — D631 Anemia in chronic kidney disease: Secondary | ICD-10-CM | POA: Diagnosis not present

## 2023-07-23 DIAGNOSIS — D631 Anemia in chronic kidney disease: Secondary | ICD-10-CM | POA: Diagnosis not present

## 2023-07-23 DIAGNOSIS — N186 End stage renal disease: Secondary | ICD-10-CM | POA: Diagnosis not present

## 2023-07-23 DIAGNOSIS — Z418 Encounter for other procedures for purposes other than remedying health state: Secondary | ICD-10-CM | POA: Diagnosis not present

## 2023-07-23 DIAGNOSIS — N2581 Secondary hyperparathyroidism of renal origin: Secondary | ICD-10-CM | POA: Diagnosis not present

## 2023-07-25 DIAGNOSIS — Z418 Encounter for other procedures for purposes other than remedying health state: Secondary | ICD-10-CM | POA: Diagnosis not present

## 2023-07-25 DIAGNOSIS — N186 End stage renal disease: Secondary | ICD-10-CM | POA: Diagnosis not present

## 2023-07-25 DIAGNOSIS — N2581 Secondary hyperparathyroidism of renal origin: Secondary | ICD-10-CM | POA: Diagnosis not present

## 2023-07-25 DIAGNOSIS — D631 Anemia in chronic kidney disease: Secondary | ICD-10-CM | POA: Diagnosis not present

## 2023-07-28 DIAGNOSIS — N186 End stage renal disease: Secondary | ICD-10-CM | POA: Diagnosis not present

## 2023-07-28 DIAGNOSIS — D631 Anemia in chronic kidney disease: Secondary | ICD-10-CM | POA: Diagnosis not present

## 2023-07-28 DIAGNOSIS — Z418 Encounter for other procedures for purposes other than remedying health state: Secondary | ICD-10-CM | POA: Diagnosis not present

## 2023-07-28 DIAGNOSIS — N2581 Secondary hyperparathyroidism of renal origin: Secondary | ICD-10-CM | POA: Diagnosis not present

## 2023-07-30 DIAGNOSIS — N2581 Secondary hyperparathyroidism of renal origin: Secondary | ICD-10-CM | POA: Diagnosis not present

## 2023-07-30 DIAGNOSIS — N186 End stage renal disease: Secondary | ICD-10-CM | POA: Diagnosis not present

## 2023-07-30 DIAGNOSIS — D631 Anemia in chronic kidney disease: Secondary | ICD-10-CM | POA: Diagnosis not present

## 2023-07-30 DIAGNOSIS — Z418 Encounter for other procedures for purposes other than remedying health state: Secondary | ICD-10-CM | POA: Diagnosis not present

## 2023-08-01 DIAGNOSIS — D631 Anemia in chronic kidney disease: Secondary | ICD-10-CM | POA: Diagnosis not present

## 2023-08-01 DIAGNOSIS — N186 End stage renal disease: Secondary | ICD-10-CM | POA: Diagnosis not present

## 2023-08-01 DIAGNOSIS — Z418 Encounter for other procedures for purposes other than remedying health state: Secondary | ICD-10-CM | POA: Diagnosis not present

## 2023-08-01 DIAGNOSIS — N2581 Secondary hyperparathyroidism of renal origin: Secondary | ICD-10-CM | POA: Diagnosis not present

## 2023-08-03 DIAGNOSIS — N186 End stage renal disease: Secondary | ICD-10-CM | POA: Diagnosis not present

## 2023-08-03 DIAGNOSIS — N2581 Secondary hyperparathyroidism of renal origin: Secondary | ICD-10-CM | POA: Diagnosis not present

## 2023-08-03 DIAGNOSIS — Z418 Encounter for other procedures for purposes other than remedying health state: Secondary | ICD-10-CM | POA: Diagnosis not present

## 2023-08-03 DIAGNOSIS — D631 Anemia in chronic kidney disease: Secondary | ICD-10-CM | POA: Diagnosis not present

## 2023-08-04 NOTE — Progress Notes (Deleted)
Patient ID: Stacey Lucas, female   DOB: 12/11/1975, 47 y.o.   MRN: [id number]     Stacey Lucas, is a 47 y.o. female     Subjective:     Stacey Lucas is a 47 y.o. female here today primary care f/u 12/03/22 Patient seen return follow-up and at the last visit patient had an acute stroke sent to the hospital for same.  Patient returns and needing outpatient physical therapy.  She had a pontine stroke.  Now in end-stage renal failure creatinine nearly 5.  She is followed by nephrology.  On arrival blood pressure 152/91.  She is on low-dose beta-blocker.  She still smoking 2 to 3 cigarettes daily.  She was using cocaine no longer doing so. Below is a copy of the discharge summary Name: Stacey Lucas MRN: [id number] DOB: Oct 11, 1975 46 y.o. PCP: Storm Frisk, MD   Date of Admission: 11/14/2022 10:01 AM Date of Discharge: 11/21/2022 Attending Physician: Reymundo Poll, MD   Discharge Diagnosis: 1. Active Problems:   Acute CVA (cerebrovascular accident) (HCC)   Nephrotic syndrome   Cerebrovascular accident (CVA) (HCC)   AKI (acute kidney injury) (HCC) T2DM HTN Hyperlipidemia Asymptomatic pyuria Anemia of chronic disease  L pontine CVA -lipid panel 3 months, LDL goal <70 secondary prevention -apixaban, no ASA or plavix -BP and diabetes optimization -neurology f/u outpatient   HTN -titrate carvedilol as needed, no BP meds that have GFR restriction   T2DM -repeat A1c in 3 months -no meds at discharge, titrate as needed   Nephrotic syndrome -eliquis 2.5 BID -lasix 40 daily -outpatient follow up with nephro -repeat cmp -f/u biopsy -check volume status   Anemia of CKD -follow up CBC -EPA every Saturday per nephrology   Polysubstance use -work on cessation   2. Labs/tests needed at follow up: renal function panel,CBC   3.  Pending labs/ test needing follow-up: Kidney biopsy results from 3/13    Hospital Course by problem list:   Acute left pontine CVA On 11/14/22  she received CT head for code stroke w/o contrast which demonstrated no acute large vessel infarct, hemorrhage, hydrocephalus or mass effect. Follow up MRI brain w/o contrast showed small acute left pontine CVA. Repeat imaging MRI angio head w/o contrast on 11/15/22 were same as previous MRI. She had dysmetria, dysarthria and new onset right side weakness that improved during her hospital stay with acute PT intervention. Will go home with [facility] PT/OT. No DAPT given nephrotic syndrome. On Heparin ggt during admission transitioned to apixaban, ok per neurology. Transitioned from crestor 20 to atorvastatin 80 during admission, LDL found to be 99 above goal of <70 for secondary prevention. BP well controlled during admission. Diabetes well controlled during admission.Will follow up with neurology in the outpatient setting.   Nephrotic Syndrome CKD During her inpatient stay, patient had GFR ranging from 10-12, c/f AKI on CKD vs CKD V. On day 1, she had edema in her face and lower extremity. Per patient, she had been experiencing this swelling for a few weeks and it was increasingly harder to fit her shoes. Renal US with evidence of medically renal disease. Marland Kitchen She was worked up for nephrotic syndrome after having a serum protein gap with albumin <1.5. 24 hour protein >6 g. ANA,C3,C4,ANCA, myeoloma panel all unrevealing. She was started on lasix 40mg  and diuresed well. She appears euvolemicOn 3/13 she underwent a kidney biopsy to determine the etiology of her kidney dysfunction which is still pending, will be followed up by nephrology.. She has  minimal soreness on the day post-op, no signs of swelling at the site, blood in urine or other complications.    Microcytic anemia Hgb of 9.8, MCV of 76.2 at admission.Ferritin wnl, iron wnl, TIBC low at 133 consistent with anemia of chronic disease.Hemoglobin stable at discharge at 8. On EPA every Saturday per nephrology.   HTN Patient was hypertensive on her amlodipine 10mg   home dose while inpatient. She was started on coreg 3.125 mg BID which resulted in adequate control of her blood pressure during most of admission. She was counseled on the importance of taking both medications to mitigate impact to her renal dysfunction.    Chest Pain On 11/16/22 patient reported some intermittent chest pain that radiated to her neck. DVT and PE studies were negative. May have been uremic pericarditis. Pain resolved during admission.   T2DM Patient's H1ac was 5.6 during admission. She was managed with SSI while inpatient and received almost no short acting. Fasting glucose primarily <120.Patient was not taking glargine or glimepiride at home, discontinued at discharge.   Asymptomatic pyuria Patient denies suprapubic pain, dysuria, or any other urinary symptoms. UA with pyuria and bacteruria. With her being asymptotic do not believe etiology of her leukocytosis or renal dysfunction   Polysubstance use Endorses snorting cocaine weekly. She became tearful when discussing this. We discussed how this can lead to further strokes and even heart attacks.Encouraged cessation.TOC consulted.   Depression Continued home celexa and wellbutrin.   Leukocytosis On presentation WBC of 16.0, predominant left shift. Denies fever, chills, cough, shortness of breath or urinary symptoms. Xray without focal opacity. Unclear etiology of this. WBC 10.2 at discharge,afebrile, HDS, no evidence of infection throughout admission.    02/04/23 Patient seen in return follow-up and having right-sided muscle spasm involving the right arm and lower leg with associated pain.  She never got physical therapy as an outpatient.  She is smoking 2 cigarettes daily.  She has had no falls at this time.  Blood pressure is elevated on arrival 189/84.  She has followed with nephrology and is about to undergo end-stage renal disease hemodialysis.  She is having catheter access placed soon.  Records from nephrology show she has  evidence of hyperparathyroidism assessment and treatment per nephrology The patient needs a rollator because she has difficulty navigating with a stationary walker also needs a shower stool because she cannot stand up to give her self baths Patient drags her right foot and would benefit from additional support of the right foot  08/06/23  No problems updated.    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 12/29/2021   Anxiety and depression    Atrial fibrillation (HCC)    Cerebral thrombosis with cerebral infarction 05/24/2021   Cocaine abuse (HCC)    Depression with suicidal ideation    [facility] admission 04/2018   Diabetes mellitus without complication (HCC)    Type II   End stage kidney disease (HCC)    GERD (gastroesophageal reflux disease)    Hyperlipidemia    Hypertension    Impingement syndrome of right shoulder    Migraine headache    Osteoarthritis of right knee 05/24/2021   Pilonidal abscess 05/2013   Pyelonephritis    Right thyroid nodule    Sciatica  MEDICATIONS AT HOME: Prior to Admission medications   Medication Sig Start Date End Date Taking? Authorizing Provider  ACCU-CHEK GUIDE test strip USE UP TO FOUR TIMES DAILY AS DIRECTED 01/31/22  Yes [provider]  Accu-Chek Softclix Lancets lancets SMARTSIG:Topical 1-4 Times Daily 01/31/22  Yes [provider]  amLODipine (NORVASC) 10 MG tablet Take 1 tablet (10 mg total) by mouth daily. 04/18/22  Yes Storm Frisk, MD  aspirin EC 81 MG tablet Take 1 tablet (81 mg total) by mouth daily. Swallow whole. 04/18/22  Yes Storm Frisk, MD  azelastine (OPTIVAR) 0.05 % ophthalmic solution Place 1 drop into both eyes 2 (two) times daily. 08/21/22  Yes  Waldon Merl, PA-C  blood glucose meter kit and supplies KIT Dispense based on patient and insurance preference. Use up to four times daily as directed. 01/31/22  Yes Fanny Dance, MD  buPROPion (WELLBUTRIN XL) 150 MG 24 hr tablet TAKE 1 TABLET(150 MG) BY MOUTH DAILY 04/18/22  Yes Storm Frisk, MD  clopidogrel (PLAVIX) 75 MG tablet TAKE 1 TABLET(75 MG) BY MOUTH DAILY 05/15/22  Yes Storm Frisk, MD  diclofenac Sodium (VOLTAREN) 1 % GEL Apply 4 g topically daily as needed (For knee pain). 04/18/22  Yes Storm Frisk, MD  Insulin Glargine [facility] KWIKPEN) 100 UNIT/ML Inject 5 Units into the skin daily. 04/18/22  Yes Storm Frisk, MD  Insulin Pen Needle 32G X 4 MM MISC Use to inject insulin at bed time 04/18/22  Yes Storm Frisk, MD  meloxicam (MOBIC) 15 MG tablet TAKE 1 TABLET(15 MG) BY MOUTH DAILY 07/01/22  Yes Storm Frisk, MD  metoprolol succinate (TOPROL-XL) 25 MG 24 hr tablet TAKE 3 TABLETS(75 MG) BY MOUTH DAILY 08/16/22  Yes Storm Frisk, MD  rosuvastatin (CRESTOR) 20 MG tablet TAKE 1 TABLET(20 MG) BY MOUTH AT BEDTIME 11/05/22  Yes Storm Frisk, MD  citalopram (CELEXA) 10 MG tablet Take 1 tablet (10 mg total) by mouth daily. 11/14/22   Anders Simmonds, PA-C  glimepiride (AMARYL) 2 MG tablet Take 1 tablet (2 mg total) by mouth daily with breakfast. 11/14/22   Anders Simmonds, PA-C  irbesartan (AVAPRO) 75 MG tablet Take 1 tablet (75 mg total) by mouth daily. 11/14/22   Anders Simmonds, PA-C   Constitutional:   No  weight loss, night sweats,  Fevers, chills, fatigue, lassitude. HEENT:   No headaches,  Difficulty swallowing,  Tooth/dental problems,  Sore throat,                No sneezing, itching, ear ache, nasal congestion, post nasal drip,   CV:  No chest pain,  Orthopnea, PND, swelling in lower extremities, anasarca, dizziness, palpitations  GI  No heartburn, indigestion, abdominal pain, nausea, vomiting, diarrhea, change in bowel habits, loss of  appetite  Resp: No shortness of breath with exertion or at rest.  No excess mucus, no productive cough,  No non-productive cough,  No coughing up of blood.  No change in color of mucus.  No wheezing.  No chest wall deformity  Skin: no rash or lesions.  GU: no dysuria, change in color of urine, no urgency or frequency.  No flank pain.  MS:  No joint pain or swelling.  No decreased range of motion.  No back pain.  Psych:  No change in mood or affect. No depression or anxiety.  No memory loss.   Objective:   There were no vitals filed for this visit.  Exam  HEENT: Atraumatic and Normocephalic, pupils equally reactive to light and accomodation Neck: Supple, no JVD. No cervical lymphadenopathy.  Chest: Good air entry bilaterally, CTAB.  No rales/rhonchi/wheezing CVS: S1 S2 regular, no murmurs.  Extremities: B/L Lower Ext shows no edema, both legs are warm to touch Neurology: Awake alert, and oriented X 3, R face with mild droop, R sided weakness and delay with movement.  All of these neurologic findings improved Skin: No Rash  Data Review Lab Results  Component Value Date   HGBA1C 5.6 07/07/2023   HGBA1C 5.6 11/14/2022   HGBA1C 5.6 11/14/2022    Assessment & Plan  I personally reviewed all images and lab data in the [facility] system as well as any outside material available during this office visit and agree with the  radiology impressions.   No problem-specific Assessment & Plan notes found for this encounter.    There are no diagnoses linked to this encounter.    No follow-ups on file.  Established Patient Office Visit  Subjective   Patient ID: Stacey Lucas, female    DOB: 10-31-1975  Age: 47 y.o. MRN: [id number]  No chief complaint on file.   HPI    Review of Systems  Constitutional:  Negative for chills, diaphoresis, fever, malaise/fatigue and weight loss.  HENT:  Negative for congestion, hearing loss, nosebleeds, sore throat and tinnitus.   Eyes:  Negative  for blurred vision, photophobia and redness.  Respiratory:  Negative for cough, hemoptysis, sputum production, shortness of breath, wheezing and stridor.   Cardiovascular:  Negative for chest pain, palpitations, orthopnea, claudication, leg swelling and PND.  Gastrointestinal:  Negative for abdominal pain, blood in stool, constipation, diarrhea, heartburn, nausea and vomiting.  Genitourinary:  Negative for dysuria, flank pain, frequency, hematuria and urgency.  Musculoskeletal:  Positive for myalgias. Negative for back pain, falls, joint pain and neck pain.  Skin:  Negative for itching and rash.  Neurological:  Positive for focal weakness and weakness. Negative for dizziness, tingling, tremors, sensory change, speech change, seizures, loss of consciousness and headaches.  Endo/Heme/Allergies:  Negative for environmental allergies and polydipsia. Does not bruise/bleed easily.  Psychiatric/Behavioral:  Negative for depression, memory loss, substance abuse and suicidal ideas. The patient is not nervous/anxious and does not have insomnia.       Objective:     LMP 10/11/2019    Physical Exam Vitals reviewed.  Constitutional:      Appearance: Normal appearance. She is well-developed. She is not diaphoretic.  HENT:     Head: Normocephalic and atraumatic.     Nose: No nasal deformity, septal deviation, mucosal edema or rhinorrhea.     Right Sinus: No maxillary sinus tenderness or frontal sinus tenderness.     Left Sinus: No maxillary sinus tenderness or frontal sinus tenderness.     Mouth/Throat:     Pharynx: No oropharyngeal exudate.  Eyes:     General: No scleral icterus.    Conjunctiva/sclera: Conjunctivae normal.     Pupils: Pupils are equal, round, and reactive to light.  Neck:     Thyroid: No thyromegaly.     Vascular: No carotid bruit or JVD.     Trachea: Trachea normal. No tracheal tenderness or tracheal deviation.  Cardiovascular:     Rate and Rhythm: Normal rate and regular  rhythm.     Chest Wall: PMI is not displaced.     Pulses: Normal pulses. No decreased pulses.     Heart sounds: Normal heart sounds, S1 normal and S2  normal. Heart sounds not distant. No murmur heard.    No systolic murmur is present.     No diastolic murmur is present.     No friction rub. No gallop. No S3 or S4 sounds.  Pulmonary:     Effort: No tachypnea, accessory muscle usage or respiratory distress.     Breath sounds: No stridor. No decreased breath sounds, wheezing, rhonchi or rales.  Chest:     Chest wall: No tenderness.  Abdominal:     General: Bowel sounds are normal. There is no distension.     Palpations: Abdomen is soft. Abdomen is not rigid.     Tenderness: There is no abdominal tenderness. There is no guarding or rebound.  Musculoskeletal:        General: Normal range of motion.     Cervical back: Normal range of motion and neck supple. No edema, erythema or rigidity. No muscular tenderness. Normal range of motion.  Lymphadenopathy:     Head:     Right side of head: No submental or submandibular adenopathy.     Left side of head: No submental or submandibular adenopathy.     Cervical: No cervical adenopathy.  Skin:    General: Skin is warm and dry.     Coloration: Skin is not pale.     Findings: No rash.     Nails: There is no clubbing.  Neurological:     Mental Status: She is alert and oriented to person, place, and time. Mental status is at baseline.     Sensory: No sensory deficit.     Motor: Weakness present.     Gait: Gait abnormal.  Psychiatric:        Speech: Speech normal.        Behavior: Behavior normal.      No results found for any visits on 08/06/23.    The ASCVD Risk score (Arnett DK, et al., 2019) failed to calculate for the following reasons:   The patient has a prior MI or stroke diagnosis    Assessment & Plan:   Problem List Items Addressed This Visit   None    No follow-ups on file.    Shan Levans, MD

## 2023-08-05 DIAGNOSIS — Z418 Encounter for other procedures for purposes other than remedying health state: Secondary | ICD-10-CM | POA: Diagnosis not present

## 2023-08-05 DIAGNOSIS — D631 Anemia in chronic kidney disease: Secondary | ICD-10-CM | POA: Diagnosis not present

## 2023-08-05 DIAGNOSIS — N186 End stage renal disease: Secondary | ICD-10-CM | POA: Diagnosis not present

## 2023-08-05 DIAGNOSIS — N2581 Secondary hyperparathyroidism of renal origin: Secondary | ICD-10-CM | POA: Diagnosis not present

## 2023-08-06 ENCOUNTER — Ambulatory Visit: Payer: 59 | Admitting: Critical Care Medicine

## 2023-08-07 DIAGNOSIS — R0689 Other abnormalities of breathing: Secondary | ICD-10-CM | POA: Diagnosis not present

## 2023-08-07 DIAGNOSIS — R0902 Hypoxemia: Secondary | ICD-10-CM | POA: Diagnosis not present

## 2023-08-07 DIAGNOSIS — I10 Essential (primary) hypertension: Secondary | ICD-10-CM | POA: Diagnosis not present

## 2023-08-08 ENCOUNTER — Emergency Department (HOSPITAL_COMMUNITY)
Admission: EM | Admit: 2023-08-08 | Discharge: 2023-08-08 | Disposition: A | Discharge status: Home / Self Care | Payer: 59 | Attending: Emergency Medicine | Admitting: Emergency Medicine

## 2023-08-08 ENCOUNTER — Encounter (HOSPITAL_COMMUNITY): Payer: Self-pay | Admitting: Emergency Medicine

## 2023-08-08 ENCOUNTER — Other Ambulatory Visit: Payer: Self-pay

## 2023-08-08 ENCOUNTER — Emergency Department (HOSPITAL_COMMUNITY): Payer: 59

## 2023-08-08 DIAGNOSIS — R0602 Shortness of breath: Secondary | ICD-10-CM | POA: Diagnosis not present

## 2023-08-08 DIAGNOSIS — Z418 Encounter for other procedures for purposes other than remedying health state: Secondary | ICD-10-CM | POA: Diagnosis not present

## 2023-08-08 DIAGNOSIS — D631 Anemia in chronic kidney disease: Secondary | ICD-10-CM | POA: Diagnosis not present

## 2023-08-08 DIAGNOSIS — J810 Acute pulmonary edema: Secondary | ICD-10-CM | POA: Insufficient documentation

## 2023-08-08 DIAGNOSIS — J40 Bronchitis, not specified as acute or chronic: Secondary | ICD-10-CM | POA: Diagnosis not present

## 2023-08-08 DIAGNOSIS — Z7901 Long term (current) use of anticoagulants: Secondary | ICD-10-CM | POA: Insufficient documentation

## 2023-08-08 DIAGNOSIS — N2581 Secondary hyperparathyroidism of renal origin: Secondary | ICD-10-CM | POA: Diagnosis not present

## 2023-08-08 DIAGNOSIS — R0600 Dyspnea, unspecified: Secondary | ICD-10-CM | POA: Diagnosis not present

## 2023-08-08 DIAGNOSIS — I517 Cardiomegaly: Secondary | ICD-10-CM | POA: Diagnosis not present

## 2023-08-08 DIAGNOSIS — Z992 Dependence on renal dialysis: Secondary | ICD-10-CM | POA: Diagnosis not present

## 2023-08-08 DIAGNOSIS — J811 Chronic pulmonary edema: Secondary | ICD-10-CM | POA: Diagnosis not present

## 2023-08-08 DIAGNOSIS — N186 End stage renal disease: Secondary | ICD-10-CM | POA: Diagnosis not present

## 2023-08-08 DIAGNOSIS — R059 Cough, unspecified: Secondary | ICD-10-CM | POA: Diagnosis not present

## 2023-08-08 LAB — COMPREHENSIVE METABOLIC PANEL
ALT: 7 U/L (ref 0–44)
AST: 14 U/L — ABNORMAL LOW (ref 15–41)
Albumin: 3.3 g/dL — ABNORMAL LOW (ref 3.5–5.0)
Alkaline Phosphatase: 67 U/L (ref 38–126)
Anion gap: 14 (ref 5–15)
BUN: 46 mg/dL — ABNORMAL HIGH (ref 6–20)
CO2: 22 mmol/L (ref 22–32)
Calcium: 8.5 mg/dL — ABNORMAL LOW (ref 8.9–10.3)
Chloride: 104 mmol/L (ref 98–111)
Creatinine, Ser: 8.49 mg/dL — ABNORMAL HIGH (ref 0.44–1.00)
GFR, Estimated: 5 mL/min — ABNORMAL LOW (ref >60)
Glucose, Bld: 103 mg/dL — ABNORMAL HIGH (ref 70–99)
Potassium: 4.5 mmol/L (ref 3.5–5.1)
Sodium: 140 mmol/L (ref 135–145)
Total Bilirubin: 0.7 mg/dL (ref <1.2)
Total Protein: 7 g/dL (ref 6.5–8.1)

## 2023-08-08 LAB — CBC WITH DIFFERENTIAL/PLATELET
Abs Immature Granulocytes: 0.02 10*3/uL (ref 0.00–0.07)
Basophils Absolute: 0.1 10*3/uL (ref 0.0–0.1)
Basophils Relative: 1 %
Eosinophils Absolute: 0.4 10*3/uL (ref 0.0–0.5)
Eosinophils Relative: 4 %
HCT: 29 % — ABNORMAL LOW (ref 36.0–46.0)
Hemoglobin: 9.3 g/dL — ABNORMAL LOW (ref 12.0–15.0)
Immature Granulocytes: 0 %
Lymphocytes Relative: 18 %
Lymphs Abs: 1.8 10*3/uL (ref 0.7–4.0)
MCH: 25.1 pg — ABNORMAL LOW (ref 26.0–34.0)
MCHC: 32.1 g/dL (ref 30.0–36.0)
MCV: 78.4 fL — ABNORMAL LOW (ref 80.0–100.0)
Monocytes Absolute: 0.6 10*3/uL (ref 0.1–1.0)
Monocytes Relative: 6 %
Neutro Abs: 6.8 10*3/uL (ref 1.7–7.7)
Neutrophils Relative %: 71 %
Platelets: 205 10*3/uL (ref 150–400)
RBC: 3.7 MIL/uL — ABNORMAL LOW (ref 3.87–5.11)
RDW: 16.7 % — ABNORMAL HIGH (ref 11.5–15.5)
WBC: 9.7 10*3/uL (ref 4.0–10.5)
nRBC: 0 % (ref 0.0–0.2)

## 2023-08-08 MED ORDER — APIXABAN 2.5 MG PO TABS: 2.5000 mg | ORAL_TABLET | Freq: Two times a day (BID) | ORAL | 1 refills | Status: DC

## 2023-08-08 MED ORDER — HYDROCODONE BIT-HOMATROP MBR 5-1.5 MG/5ML PO SOLN: 5.0000 mL | Freq: Four times a day (QID) | ORAL | 0 refills | Status: DC | PRN

## 2023-08-08 MED ORDER — HYDROCODONE BIT-HOMATROP MBR 5-1.5 MG/5ML PO SOLN
5.0000 mL | ORAL | Status: DC | PRN
  Administered 2023-08-08 (×2): 5 mL via ORAL
  Filled 2023-08-08 (×2): qty 5

## 2023-08-08 MED ORDER — AMLODIPINE BESYLATE 10 MG PO TABS: 10.0000 mg | ORAL_TABLET | Freq: Every day | ORAL | 0 refills | Status: DC

## 2023-08-08 MED ORDER — PREDNISONE 20 MG PO TABS
60.0000 mg | ORAL_TABLET | Freq: Once | ORAL | Status: AC
  Administered 2023-08-08: 60 mg via ORAL
  Filled 2023-08-08: qty 3

## 2023-08-08 MED ORDER — PREDNISONE 20 MG PO TABS: ORAL_TABLET | ORAL | 0 refills | Status: DC

## 2023-08-08 MED ORDER — APIXABAN 2.5 MG PO TABS
2.5000 mg | ORAL_TABLET | Freq: Two times a day (BID) | ORAL | Status: DC
  Administered 2023-08-08: 2.5 mg via ORAL
  Filled 2023-08-08: qty 1

## 2023-08-08 MED ORDER — CARVEDILOL 6.25 MG PO TABS: 3.1250 mg | ORAL_TABLET | Freq: Two times a day (BID) | ORAL | 0 refills | Status: DC

## 2023-08-08 MED ORDER — ONDANSETRON 4 MG PO TBDP
8.0000 mg | ORAL_TABLET | Freq: Once | ORAL | Status: AC
  Administered 2023-08-08: 8 mg via ORAL
  Filled 2023-08-08: qty 2

## 2023-08-08 MED ORDER — ALBUTEROL SULFATE HFA 108 (90 BASE) MCG/ACT IN AERS
2.0000 | INHALATION_SPRAY | Freq: Once | RESPIRATORY_TRACT | Status: AC
  Administered 2023-08-08: 2 via RESPIRATORY_TRACT
  Filled 2023-08-08: qty 6.7

## 2023-08-08 NOTE — ED Notes (Signed)
Pt provided with spacer and instructed on use per MD Messner verbal order.

## 2023-08-08 NOTE — ED Notes (Signed)
Pt vomited after inhaler admin. MD made aware

## 2023-08-08 NOTE — ED Triage Notes (Signed)
Pt arrived via EMS from home. Pt c/o shortness of breath that woke her from sleep tonight and cough x 2 days. Pt receives dialysis typically MWF but due to the holiday she was scheduled for Sun, Tues, Fri this week. Pt denies missed treatments. Pt arrived on 2L [place] with SpO2 97%, EMS reports SpO2 of 90% on RA on their arrival.

## 2023-08-08 NOTE — ED Provider Notes (Signed)
[place] EMERGENCY DEPARTMENT AT [facility] Provider Note   CSN: [id number] Arrival date & time: 08/08/23  0009     History  Chief Complaint  Patient presents with   Shortness of Breath    Stacey Lucas is a 47 y.o. female.  47 year old female with history end-stage renal disease on dialysis who presents ER today secondary to dyspnea.  Patient states that she been coughing a lot for last couple days.  Is been dry.  She has been having some right sided lower rib upper abdominal pain thinks is related to coughing so much with some any coughing fits and the strength of her cough.  She is getting dialysis this week Sunday Tuesday Friday however usually gets it Monday Wednesday Friday.  She did receive her Sunday Tuesday treatments.  No fevers.  Does smoke cigarettes but is reducing the amount.  No history of COPD.   Shortness of Breath      Home Medications Prior to Admission medications   Medication Sig Start Date End Date Taking? Authorizing Provider  apixaban (ELIQUIS) 2.5 MG TABS tablet Take 1 tablet (2.5 mg total) by mouth 2 (two) times daily. 08/08/23 09/07/23 Yes Jolie Strohecker, Barbara Cower, MD  HYDROcodone bit-homatropine (HYCODAN) 5-1.5 MG/5ML syrup Take 5 mLs by mouth every 6 (six) hours as needed for cough. 08/08/23  Yes Uyen Eichholz, Barbara Cower, MD  predniSONE (DELTASONE) 20 MG tablet 2 tabs po daily x 4 days 08/08/23  Yes Neela Zecca, Barbara Cower, MD  amLODipine (NORVASC) 10 MG tablet Take 1 tablet (10 mg total) by mouth daily. 08/08/23   Zakiya Sporrer, Barbara Cower, MD  carvedilol (COREG) 6.25 MG tablet Take 0.5 tablets (3.125 mg total) by mouth 2 (two) times daily with a meal. 08/08/23 09/07/23  India Jolin, Barbara Cower, MD  sevelamer carbonate (RENVELA) 800 MG tablet Take 800 mg by mouth daily as needed. Patient not taking: Reported on 08/08/2023 05/15/23 05/14/24  [provider]      Allergies    Neurontin [gabapentin]    Review of Systems   Review of Systems  Respiratory:  Positive for shortness of  breath.     Physical Exam Updated Vital Signs BP (!) 185/96   Pulse (!) 104   Temp 98.8 F (37.1 C)   Resp (!) 26   Ht 5\' 5"  (1.651 m)   Wt 61.2 kg   LMP 10/11/2019   SpO2 100%   BMI 22.47 kg/m  Physical Exam Vitals and nursing note reviewed.  Constitutional:      Appearance: She is well-developed.  HENT:     Head: Normocephalic and atraumatic.  Cardiovascular:     Rate and Rhythm: Regular rhythm. Tachycardia present.  Pulmonary:     Effort: Tachypnea present. No respiratory distress.     Breath sounds: No stridor. Examination of the right-upper field reveals wheezing. Examination of the left-upper field reveals wheezing. Examination of the left-lower field reveals rales. Wheezing and rales present. No decreased breath sounds.  Abdominal:     General: There is no distension.  Musculoskeletal:     Cervical back: Normal range of motion.  Neurological:     Mental Status: She is alert.     ED Results / Procedures / Treatments   Labs (all labs ordered are listed, but only abnormal results are displayed) Labs Reviewed  CBC WITH DIFFERENTIAL/PLATELET - Abnormal; Notable for the following components:      Result Value   RBC 3.70 (*)    Hemoglobin 9.3 (*)    HCT 29.0 (*)  MCV 78.4 (*)    MCH 25.1 (*)    RDW 16.7 (*)    All other components within normal limits  COMPREHENSIVE METABOLIC PANEL - Abnormal; Notable for the following components:   Glucose, Bld 103 (*)    BUN 46 (*)    Creatinine, Ser 8.49 (*)    Calcium 8.5 (*)    Albumin 3.3 (*)    AST 14 (*)    GFR, Estimated 5 (*)    All other components within normal limits    EKG None  Radiology DG Chest Portable 1 View  Result Date: 08/08/2023 CLINICAL DATA:  Cough and shortness of breath EXAM: PORTABLE CHEST 1 VIEW COMPARISON:  07/13/2023 FINDINGS: Right IJ dialysis catheter tip in the right atrium. Cardiomegaly. Bilateral hazy airspace and interstitial opacities with a perihilar and lower lung  predominance. No pleural effusion or pneumothorax. No displaced rib fractures. IMPRESSION: Cardiomegaly with pulmonary edema. Electronically Signed   By: Minerva Fester M.D.   On: 08/08/2023 01:11    Procedures Procedures    Medications Ordered in ED Medications  HYDROcodone bit-homatropine (HYCODAN) 5-1.5 MG/5ML syrup 5 mL (5 mLs Oral Given 08/08/23 0459)  apixaban (ELIQUIS) tablet 2.5 mg (2.5 mg Oral Given 08/08/23 0531)  albuterol (VENTOLIN HFA) 108 (90 Base) MCG/ACT inhaler 2 puff (has no administration in time range)  predniSONE (DELTASONE) tablet 60 mg (has no administration in time range)    ED Course/ Medical Decision Making/ A&P                                 Medical Decision Making Amount and/or Complexity of Data Reviewed Labs: ordered. Radiology: ordered.  Risk Prescription drug management.  Patient has not missed dialysis and has been coughing quite a bit with asymmetric crackles so we will get chest x-ray to evaluate for fluid versus pneumonia.  Also consider possible bronchitis with wheezing and smoking history.  Patient had significant improvement with her cough and shortness of breath after breathing treatment and Hycodan here.  Started steroids.  Given albuterol inhaler.  Patient observed for another couple hours afterwards with no recurrent hypoxia.  Still mild tachypnea.  She is on Eliquis for A-fib and she does know when she took it last however I do not think she has a DVT or PE.  Her chest x-ray is reviewed interpreted by myself does show some diffuse bronchitic type changes, radiology thinks is more consistent with pulmonary edema.  She is also not compliant with her hypertension medications, I encouraged that she takes those.  With proven stability and ability to get dialysis quicker with her outpatient appointment then I would build to get it for her here I think she stable to go for that today.  She will continue steroids and breathing treatments afterwards I  think that helped more than anything.  Will return here for any new or worsening symptoms.  Will discuss with her nephrologist regarding a possible change in her dry weight to avoid multiple ER visits and admissions for pulmonary edema.  Final Clinical Impression(s) / ED Diagnoses Final diagnoses:  Bronchitis  Acute pulmonary edema (HCC)    Rx / DC Orders ED Discharge Orders          Ordered    predniSONE (DELTASONE) 20 MG tablet        08/08/23 0605    HYDROcodone bit-homatropine (HYCODAN) 5-1.5 MG/5ML syrup  Every 6 hours PRN  08/08/23 0605    apixaban (ELIQUIS) 2.5 MG TABS tablet  2 times daily        08/08/23 0605    amLODipine (NORVASC) 10 MG tablet  Daily       Note to Pharmacy: Future refill   08/08/23 0605    carvedilol (COREG) 6.25 MG tablet  2 times daily with meals       Note to Pharmacy: Future refill   08/08/23 0605              Chamberlain Steinborn, Barbara Cower, MD 08/08/23 [id number]

## 2023-08-08 NOTE — ED Notes (Signed)
Pt notified pharmacy tech that she is still having cough and rib pain. PRN medication provided per order. Pt also states she is unsure when she last took her medications including her eliquis. MD Messner made aware.

## 2023-08-08 NOTE — ED Notes (Signed)
Discharge paperwork discussed with patient. Pt verbalized understanding of d/c teaching including follow up care, medications and reasons to return to the ED. Pt unsure who could pick her up from ED and take her to dialysis today. This RN called pts contact [facility] with permission, no answer x 2. Pt provided with phone and instructed to call family at this time.

## 2023-08-08 NOTE — ED Notes (Signed)
Pt ambulated to restroom and back to bed with this RN assistance.

## 2023-08-08 NOTE — Discharge Instructions (Addendum)
I think your cough and shortness of breath are from bronchitis and pulmonary edema. You are breathing better after breathing treatments here, continue steroids and cough medications (sent to your pharmacy and use the inhaler, 2 puffs every fours and as needed for the next 5 days.  Go to dialysis today. See if they think your dry weight is appropriate.  Return to the ED if any change or worsening in symptoms.

## 2023-08-09 DIAGNOSIS — Z992 Dependence on renal dialysis: Secondary | ICD-10-CM | POA: Diagnosis not present

## 2023-08-09 DIAGNOSIS — N186 End stage renal disease: Secondary | ICD-10-CM | POA: Diagnosis not present

## 2023-08-11 DIAGNOSIS — N186 End stage renal disease: Secondary | ICD-10-CM | POA: Diagnosis not present

## 2023-08-11 DIAGNOSIS — D509 Iron deficiency anemia, unspecified: Secondary | ICD-10-CM | POA: Diagnosis not present

## 2023-08-11 DIAGNOSIS — D631 Anemia in chronic kidney disease: Secondary | ICD-10-CM | POA: Diagnosis not present

## 2023-08-11 DIAGNOSIS — N2581 Secondary hyperparathyroidism of renal origin: Secondary | ICD-10-CM | POA: Diagnosis not present

## 2023-08-11 DIAGNOSIS — Z418 Encounter for other procedures for purposes other than remedying health state: Secondary | ICD-10-CM | POA: Diagnosis not present

## 2023-08-13 DIAGNOSIS — D509 Iron deficiency anemia, unspecified: Secondary | ICD-10-CM | POA: Diagnosis not present

## 2023-08-13 DIAGNOSIS — N2581 Secondary hyperparathyroidism of renal origin: Secondary | ICD-10-CM | POA: Diagnosis not present

## 2023-08-13 DIAGNOSIS — Z418 Encounter for other procedures for purposes other than remedying health state: Secondary | ICD-10-CM | POA: Diagnosis not present

## 2023-08-13 DIAGNOSIS — N186 End stage renal disease: Secondary | ICD-10-CM | POA: Diagnosis not present

## 2023-08-13 DIAGNOSIS — D631 Anemia in chronic kidney disease: Secondary | ICD-10-CM | POA: Diagnosis not present

## 2023-08-13 DIAGNOSIS — E1121 Type 2 diabetes mellitus with diabetic nephropathy: Secondary | ICD-10-CM | POA: Diagnosis not present

## 2023-08-15 DIAGNOSIS — D631 Anemia in chronic kidney disease: Secondary | ICD-10-CM | POA: Diagnosis not present

## 2023-08-15 DIAGNOSIS — Z418 Encounter for other procedures for purposes other than remedying health state: Secondary | ICD-10-CM | POA: Diagnosis not present

## 2023-08-15 DIAGNOSIS — N2581 Secondary hyperparathyroidism of renal origin: Secondary | ICD-10-CM | POA: Diagnosis not present

## 2023-08-15 DIAGNOSIS — D509 Iron deficiency anemia, unspecified: Secondary | ICD-10-CM | POA: Diagnosis not present

## 2023-08-15 DIAGNOSIS — N186 End stage renal disease: Secondary | ICD-10-CM | POA: Diagnosis not present

## 2023-08-18 DIAGNOSIS — D631 Anemia in chronic kidney disease: Secondary | ICD-10-CM | POA: Diagnosis not present

## 2023-08-18 DIAGNOSIS — N2581 Secondary hyperparathyroidism of renal origin: Secondary | ICD-10-CM | POA: Diagnosis not present

## 2023-08-18 DIAGNOSIS — D509 Iron deficiency anemia, unspecified: Secondary | ICD-10-CM | POA: Diagnosis not present

## 2023-08-18 DIAGNOSIS — N186 End stage renal disease: Secondary | ICD-10-CM | POA: Diagnosis not present

## 2023-08-18 DIAGNOSIS — Z418 Encounter for other procedures for purposes other than remedying health state: Secondary | ICD-10-CM | POA: Diagnosis not present

## 2023-08-20 DIAGNOSIS — Z418 Encounter for other procedures for purposes other than remedying health state: Secondary | ICD-10-CM | POA: Diagnosis not present

## 2023-08-20 DIAGNOSIS — D631 Anemia in chronic kidney disease: Secondary | ICD-10-CM | POA: Diagnosis not present

## 2023-08-20 DIAGNOSIS — D509 Iron deficiency anemia, unspecified: Secondary | ICD-10-CM | POA: Diagnosis not present

## 2023-08-20 DIAGNOSIS — N186 End stage renal disease: Secondary | ICD-10-CM | POA: Diagnosis not present

## 2023-08-20 DIAGNOSIS — N2581 Secondary hyperparathyroidism of renal origin: Secondary | ICD-10-CM | POA: Diagnosis not present

## 2023-08-22 DIAGNOSIS — N2581 Secondary hyperparathyroidism of renal origin: Secondary | ICD-10-CM | POA: Diagnosis not present

## 2023-08-22 DIAGNOSIS — D631 Anemia in chronic kidney disease: Secondary | ICD-10-CM | POA: Diagnosis not present

## 2023-08-22 DIAGNOSIS — N186 End stage renal disease: Secondary | ICD-10-CM | POA: Diagnosis not present

## 2023-08-22 DIAGNOSIS — D509 Iron deficiency anemia, unspecified: Secondary | ICD-10-CM | POA: Diagnosis not present

## 2023-08-22 DIAGNOSIS — Z418 Encounter for other procedures for purposes other than remedying health state: Secondary | ICD-10-CM | POA: Diagnosis not present

## 2023-08-25 DIAGNOSIS — N186 End stage renal disease: Secondary | ICD-10-CM | POA: Diagnosis not present

## 2023-08-25 DIAGNOSIS — D509 Iron deficiency anemia, unspecified: Secondary | ICD-10-CM | POA: Diagnosis not present

## 2023-08-25 DIAGNOSIS — N2581 Secondary hyperparathyroidism of renal origin: Secondary | ICD-10-CM | POA: Diagnosis not present

## 2023-08-25 DIAGNOSIS — D631 Anemia in chronic kidney disease: Secondary | ICD-10-CM | POA: Diagnosis not present

## 2023-08-25 DIAGNOSIS — Z418 Encounter for other procedures for purposes other than remedying health state: Secondary | ICD-10-CM | POA: Diagnosis not present

## 2023-08-27 DIAGNOSIS — D509 Iron deficiency anemia, unspecified: Secondary | ICD-10-CM | POA: Diagnosis not present

## 2023-08-27 DIAGNOSIS — N186 End stage renal disease: Secondary | ICD-10-CM | POA: Diagnosis not present

## 2023-08-27 DIAGNOSIS — N2581 Secondary hyperparathyroidism of renal origin: Secondary | ICD-10-CM | POA: Diagnosis not present

## 2023-08-27 DIAGNOSIS — D631 Anemia in chronic kidney disease: Secondary | ICD-10-CM | POA: Diagnosis not present

## 2023-08-27 DIAGNOSIS — Z418 Encounter for other procedures for purposes other than remedying health state: Secondary | ICD-10-CM | POA: Diagnosis not present

## 2023-08-29 DIAGNOSIS — D631 Anemia in chronic kidney disease: Secondary | ICD-10-CM | POA: Diagnosis not present

## 2023-08-29 DIAGNOSIS — Z418 Encounter for other procedures for purposes other than remedying health state: Secondary | ICD-10-CM | POA: Diagnosis not present

## 2023-08-29 DIAGNOSIS — D509 Iron deficiency anemia, unspecified: Secondary | ICD-10-CM | POA: Diagnosis not present

## 2023-08-29 DIAGNOSIS — N2581 Secondary hyperparathyroidism of renal origin: Secondary | ICD-10-CM | POA: Diagnosis not present

## 2023-08-29 DIAGNOSIS — N186 End stage renal disease: Secondary | ICD-10-CM | POA: Diagnosis not present

## 2023-09-05 DIAGNOSIS — D631 Anemia in chronic kidney disease: Secondary | ICD-10-CM | POA: Diagnosis not present

## 2023-09-05 DIAGNOSIS — N2581 Secondary hyperparathyroidism of renal origin: Secondary | ICD-10-CM | POA: Diagnosis not present

## 2023-09-05 DIAGNOSIS — N186 End stage renal disease: Secondary | ICD-10-CM | POA: Diagnosis not present

## 2023-09-05 DIAGNOSIS — Z418 Encounter for other procedures for purposes other than remedying health state: Secondary | ICD-10-CM | POA: Diagnosis not present

## 2023-09-07 DIAGNOSIS — N2581 Secondary hyperparathyroidism of renal origin: Secondary | ICD-10-CM | POA: Diagnosis not present

## 2023-09-07 DIAGNOSIS — Z418 Encounter for other procedures for purposes other than remedying health state: Secondary | ICD-10-CM | POA: Diagnosis not present

## 2023-09-07 DIAGNOSIS — N186 End stage renal disease: Secondary | ICD-10-CM | POA: Diagnosis not present

## 2023-09-07 DIAGNOSIS — D631 Anemia in chronic kidney disease: Secondary | ICD-10-CM | POA: Diagnosis not present

## 2023-09-09 DIAGNOSIS — Z418 Encounter for other procedures for purposes other than remedying health state: Secondary | ICD-10-CM | POA: Diagnosis not present

## 2023-09-09 DIAGNOSIS — N186 End stage renal disease: Secondary | ICD-10-CM | POA: Diagnosis not present

## 2023-09-09 DIAGNOSIS — N2581 Secondary hyperparathyroidism of renal origin: Secondary | ICD-10-CM | POA: Diagnosis not present

## 2023-09-09 DIAGNOSIS — Z992 Dependence on renal dialysis: Secondary | ICD-10-CM | POA: Diagnosis not present

## 2023-09-09 DIAGNOSIS — D631 Anemia in chronic kidney disease: Secondary | ICD-10-CM | POA: Diagnosis not present

## 2023-09-12 DIAGNOSIS — N186 End stage renal disease: Secondary | ICD-10-CM | POA: Diagnosis not present

## 2023-09-12 DIAGNOSIS — Z418 Encounter for other procedures for purposes other than remedying health state: Secondary | ICD-10-CM | POA: Diagnosis not present

## 2023-09-12 DIAGNOSIS — D509 Iron deficiency anemia, unspecified: Secondary | ICD-10-CM | POA: Diagnosis not present

## 2023-09-12 DIAGNOSIS — D631 Anemia in chronic kidney disease: Secondary | ICD-10-CM | POA: Diagnosis not present

## 2023-09-12 DIAGNOSIS — N2581 Secondary hyperparathyroidism of renal origin: Secondary | ICD-10-CM | POA: Diagnosis not present

## 2023-09-15 DIAGNOSIS — Z418 Encounter for other procedures for purposes other than remedying health state: Secondary | ICD-10-CM | POA: Diagnosis not present

## 2023-09-15 DIAGNOSIS — N186 End stage renal disease: Secondary | ICD-10-CM | POA: Diagnosis not present

## 2023-09-15 DIAGNOSIS — D631 Anemia in chronic kidney disease: Secondary | ICD-10-CM | POA: Diagnosis not present

## 2023-09-15 DIAGNOSIS — D509 Iron deficiency anemia, unspecified: Secondary | ICD-10-CM | POA: Diagnosis not present

## 2023-09-15 DIAGNOSIS — N2581 Secondary hyperparathyroidism of renal origin: Secondary | ICD-10-CM | POA: Diagnosis not present

## 2023-09-17 ENCOUNTER — Emergency Department (HOSPITAL_COMMUNITY)
Admission: EM | Admit: 2023-09-17 | Discharge: 2023-09-18 | Disposition: A | Discharge status: Home / Self Care | Payer: 59 | Attending: Emergency Medicine | Admitting: Emergency Medicine

## 2023-09-17 ENCOUNTER — Emergency Department (HOSPITAL_COMMUNITY): Payer: 59

## 2023-09-17 ENCOUNTER — Other Ambulatory Visit: Payer: Self-pay

## 2023-09-17 ENCOUNTER — Encounter (HOSPITAL_COMMUNITY): Payer: Self-pay | Admitting: Emergency Medicine

## 2023-09-17 DIAGNOSIS — Z7901 Long term (current) use of anticoagulants: Secondary | ICD-10-CM | POA: Diagnosis not present

## 2023-09-17 DIAGNOSIS — R079 Chest pain, unspecified: Secondary | ICD-10-CM | POA: Insufficient documentation

## 2023-09-17 DIAGNOSIS — D509 Iron deficiency anemia, unspecified: Secondary | ICD-10-CM | POA: Diagnosis not present

## 2023-09-17 DIAGNOSIS — R002 Palpitations: Secondary | ICD-10-CM | POA: Diagnosis not present

## 2023-09-17 DIAGNOSIS — N2581 Secondary hyperparathyroidism of renal origin: Secondary | ICD-10-CM | POA: Diagnosis not present

## 2023-09-17 DIAGNOSIS — R0789 Other chest pain: Secondary | ICD-10-CM | POA: Diagnosis not present

## 2023-09-17 DIAGNOSIS — E1121 Type 2 diabetes mellitus with diabetic nephropathy: Secondary | ICD-10-CM | POA: Diagnosis not present

## 2023-09-17 DIAGNOSIS — R110 Nausea: Secondary | ICD-10-CM | POA: Diagnosis not present

## 2023-09-17 DIAGNOSIS — Z418 Encounter for other procedures for purposes other than remedying health state: Secondary | ICD-10-CM | POA: Diagnosis not present

## 2023-09-17 DIAGNOSIS — R000 Tachycardia, unspecified: Secondary | ICD-10-CM | POA: Diagnosis not present

## 2023-09-17 DIAGNOSIS — I517 Cardiomegaly: Secondary | ICD-10-CM | POA: Diagnosis not present

## 2023-09-17 DIAGNOSIS — D631 Anemia in chronic kidney disease: Secondary | ICD-10-CM | POA: Diagnosis not present

## 2023-09-17 DIAGNOSIS — N186 End stage renal disease: Secondary | ICD-10-CM | POA: Diagnosis not present

## 2023-09-17 LAB — BASIC METABOLIC PANEL
Anion gap: 12 (ref 5–15)
BUN: 12 mg/dL (ref 6–20)
CO2: 27 mmol/L (ref 22–32)
Calcium: 8.5 mg/dL — ABNORMAL LOW (ref 8.9–10.3)
Chloride: 98 mmol/L (ref 98–111)
Creatinine, Ser: 3.89 mg/dL — ABNORMAL HIGH (ref 0.44–1.00)
GFR, Estimated: 14 mL/min — ABNORMAL LOW (ref >60)
Glucose, Bld: 112 mg/dL — ABNORMAL HIGH (ref 70–99)
Potassium: 3.9 mmol/L (ref 3.5–5.1)
Sodium: 137 mmol/L (ref 135–145)

## 2023-09-17 LAB — CBC
HCT: 31.6 % — ABNORMAL LOW (ref 36.0–46.0)
Hemoglobin: 10.2 g/dL — ABNORMAL LOW (ref 12.0–15.0)
MCH: 25.6 pg — ABNORMAL LOW (ref 26.0–34.0)
MCHC: 32.3 g/dL (ref 30.0–36.0)
MCV: 79.4 fL — ABNORMAL LOW (ref 80.0–100.0)
Platelets: 238 10*3/uL (ref 150–400)
RBC: 3.98 MIL/uL (ref 3.87–5.11)
RDW: 17 % — ABNORMAL HIGH (ref 11.5–15.5)
WBC: 8.4 10*3/uL (ref 4.0–10.5)
nRBC: 0 % (ref 0.0–0.2)

## 2023-09-17 LAB — BRAIN NATRIURETIC PEPTIDE: B Natriuretic Peptide: 1284.2 pg/mL — ABNORMAL HIGH (ref 0.0–100.0)

## 2023-09-17 LAB — MAGNESIUM: Magnesium: 1.9 mg/dL (ref 1.7–2.4)

## 2023-09-17 LAB — TROPONIN I (HIGH SENSITIVITY): Troponin I (High Sensitivity): 53 ng/L — ABNORMAL HIGH (ref <18)

## 2023-09-17 MED ORDER — MORPHINE SULFATE (PF) 4 MG/ML IV SOLN
4.0000 mg | Freq: Once | INTRAVENOUS | Status: AC
Start: 2023-09-17 — End: 2023-09-17
  Administered 2023-09-17: 4 mg via INTRAVENOUS
  Filled 2023-09-17: qty 1

## 2023-09-17 NOTE — ED Triage Notes (Signed)
 Pt bib EMS for complains of CP while at dialysis. Pt completed 3.5 hours. States pain started yesterday and became unbearable while at dialysis.

## 2023-09-17 NOTE — Progress Notes (Signed)
 VAST consult. Secure chat sent to nurse. Notified there are no orders (infusions, test, procedures, SL orders) that substantiate PIV placement at this time. Tomasita Morrow, RN VAST

## 2023-09-17 NOTE — ED Provider Notes (Signed)
 [place] EM[facility] DEPARTMENT AT [facility] Provider Note   CSN: [id number] Arrival date & time: 09/17/23  1636     History  Chief Complaint  Patient presents with   Chest Pain    Stacey Lucas is a 48 y.o. female.  HPI   48 year old female on hemodialysis presents emergency department with concern for episode of chest pain and palpitations.  Patient states that she had an episode yesterday and since has felt fatigued.  When she showed up at dialysis she was able to get through most of her session until the midsternal chest discomfort became more severe with palpitations.  This is when she stopped her session early and came here for evaluation.  The pain does not radiate to her back, she has no cough and does not feel short of breath.  Does not feel like her lower legs are swelling more than usual.  Is documented to have history of atrial fibrillation and be on Eliquis  however patient currently does not follow with cardiology and admits that she has not taking any of her home medications due to the fact that she does not want to.  Home Medications Prior to Admission medications   Medication Sig Start Date End Date Taking? Authorizing Provider  amLODipine  (NORVASC ) 10 MG tablet Take 1 tablet (10 mg total) by mouth daily. 08/08/23  Yes Mesner, Selinda, MD  apixaban  (ELIQUIS ) 2.5 MG TABS tablet Take 1 tablet (2.5 mg total) by mouth 2 (two) times daily. 08/08/23 09/17/23 Yes Mesner, Selinda, MD  predniSONE  (DELTASONE ) 20 MG tablet 2 tabs po daily x 4 days 08/08/23  Yes Mesner, Selinda, MD  sevelamer  carbonate (RENVELA ) 800 MG tablet Take 800 mg by mouth 3 (three) times daily with meals. 05/15/23 05/14/24 Yes [provider]  carvedilol  (COREG ) 6.25 MG tablet Take 0.5 tablets (3.125 mg total) by mouth 2 (two) times daily with a meal. 08/08/23 09/07/23  Mesner, Selinda, MD      Allergies    Neurontin  [gabapentin ]    Review of Systems   Review of Systems  Constitutional:   Positive for fatigue. Negative for fever.  Respiratory:  Negative for shortness of breath.   Cardiovascular:  Positive for chest pain and palpitations. Negative for leg swelling.  Gastrointestinal:  Negative for abdominal pain, diarrhea and vomiting.  Skin:  Negative for rash.  Neurological:  Negative for headaches.    Physical Exam Updated Vital Signs Ht 5' 5 (1.651 m)   Wt 61.2 kg   LMP 10/11/2019   BMI 22.45 kg/m  Physical Exam Vitals and nursing note reviewed.  Constitutional:      General: She is not in acute distress.    Appearance: Normal appearance.  HENT:     Head: Normocephalic.     Mouth/Throat:     Mouth: Mucous membranes are moist.  Cardiovascular:     Rate and Rhythm: Tachycardia present.  Pulmonary:     Effort: Pulmonary effort is normal. No respiratory distress.  Abdominal:     Palpations: Abdomen is soft.     Tenderness: There is no abdominal tenderness.  Skin:    General: Skin is warm.  Neurological:     Mental Status: She is alert and oriented to person, place, and time. Mental status is at baseline.  Psychiatric:        Mood and Affect: Mood normal.     ED Results / Procedures / Treatments   Labs (all labs ordered are listed, but only abnormal results are displayed)  Labs Reviewed  BASIC METABOLIC PANEL  CBC  MAGNESIUM   TROPONIN I (HIGH SENSITIVITY)  TROPONIN I (HIGH SENSITIVITY)    EKG EKG Interpretation Date/Time:  Wednesday September 17 2023 18:12:55 EST Ventricular Rate:  90 PR Interval:  179 QRS Duration:  84 QT Interval:  441 QTC Calculation: 540 R Axis:   -43  Text Interpretation: Sinus rhythm Probable left atrial enlargement LVH with secondary repolarization abnormality Prolonged QT interval NSR, similar t waves to previous, similar QTc Confirmed by Bari Flank [phone]) on 09/17/2023 6:16:03 PM  Radiology No results found.  Procedures Procedures    Medications Ordered in ED Medications - No data to display  ED Course/  Medical Decision Making/ A&P                                 Medical Decision Making Amount and/or Complexity of Data Reviewed Labs: ordered. Radiology: ordered.  Risk Prescription drug management.   48 year old female presents emergency department with episodes of chest pain and palpitations.  She is tachycardic on arrival, EKG is difficult to decipher P waves but seems more regular, possible flutter.  She does have history of atrial fibrillation but admits that she is not compliant with her medications including anticoagulation.  On reevaluation the patient spontaneously converted to normal sinus rhythm.  She does have T wave abnormalities which is baseline for her when compared to previous EKGs.  She is currently feeling better and no chest pain.  Chest x-ray shows baseline cardiomegaly without any acute findings.  Blood work is baseline for the patient in terms of anemia, kidney dysfunction.  She does have an elevated BNP but no other acute findings of heart failure.  Her troponin is elevated however around baseline for her.  Patient was monitored in the emergency department, she has remained in normal sinus rhythm with no recurrence of chest pain/palpitations.  Encouraged the patient for medication compliance and outpatient cardiology follow-up and she understands.  With the resolution of symptoms and now normal sinus rhythm I have lower suspicion for other chest pain etiologies like ACS, dissection, PE.  Patient at this time appears safe and stable for discharge and close outpatient follow up. Discharge plan and strict return to ED precautions discussed, patient verbalizes understanding and agreement.        Final Clinical Impression(s) / ED Diagnoses Final diagnoses:  None    Rx / DC Orders ED Discharge Orders     None         Bari Flank HERO, DO 09/18/23 0015

## 2023-09-18 ENCOUNTER — Other Ambulatory Visit (HOSPITAL_COMMUNITY): Payer: Self-pay | Admitting: Nephrology

## 2023-09-18 DIAGNOSIS — N186 End stage renal disease: Secondary | ICD-10-CM

## 2023-09-18 NOTE — Discharge Instructions (Addendum)
 You have been seen and discharged from the emergency department.  Your heart rhythm became normal.  Your blood work was normal for you.  It is extremely important that you are compliant with your medications and follow-up as an outpatient with cardiology.  Follow-up with your primary provider for further evaluation and further care. Take home medications as prescribed. If you have any worsening symptoms or further concerns for your health please return to an emergency department for further evaluation.

## 2023-09-22 DIAGNOSIS — N2581 Secondary hyperparathyroidism of renal origin: Secondary | ICD-10-CM | POA: Diagnosis not present

## 2023-09-22 DIAGNOSIS — D509 Iron deficiency anemia, unspecified: Secondary | ICD-10-CM | POA: Diagnosis not present

## 2023-09-22 DIAGNOSIS — D631 Anemia in chronic kidney disease: Secondary | ICD-10-CM | POA: Diagnosis not present

## 2023-09-22 DIAGNOSIS — Z418 Encounter for other procedures for purposes other than remedying health state: Secondary | ICD-10-CM | POA: Diagnosis not present

## 2023-09-22 DIAGNOSIS — N186 End stage renal disease: Secondary | ICD-10-CM | POA: Diagnosis not present

## 2023-09-24 DIAGNOSIS — D631 Anemia in chronic kidney disease: Secondary | ICD-10-CM | POA: Diagnosis not present

## 2023-09-24 DIAGNOSIS — D509 Iron deficiency anemia, unspecified: Secondary | ICD-10-CM | POA: Diagnosis not present

## 2023-09-24 DIAGNOSIS — Z418 Encounter for other procedures for purposes other than remedying health state: Secondary | ICD-10-CM | POA: Diagnosis not present

## 2023-09-24 DIAGNOSIS — N186 End stage renal disease: Secondary | ICD-10-CM | POA: Diagnosis not present

## 2023-09-24 DIAGNOSIS — N2581 Secondary hyperparathyroidism of renal origin: Secondary | ICD-10-CM | POA: Diagnosis not present

## 2023-09-26 DIAGNOSIS — D631 Anemia in chronic kidney disease: Secondary | ICD-10-CM | POA: Diagnosis not present

## 2023-09-26 DIAGNOSIS — Z418 Encounter for other procedures for purposes other than remedying health state: Secondary | ICD-10-CM | POA: Diagnosis not present

## 2023-09-26 DIAGNOSIS — N2581 Secondary hyperparathyroidism of renal origin: Secondary | ICD-10-CM | POA: Diagnosis not present

## 2023-09-26 DIAGNOSIS — D509 Iron deficiency anemia, unspecified: Secondary | ICD-10-CM | POA: Diagnosis not present

## 2023-09-26 DIAGNOSIS — N186 End stage renal disease: Secondary | ICD-10-CM | POA: Diagnosis not present

## 2023-09-29 DIAGNOSIS — N186 End stage renal disease: Secondary | ICD-10-CM | POA: Diagnosis not present

## 2023-09-29 DIAGNOSIS — Z418 Encounter for other procedures for purposes other than remedying health state: Secondary | ICD-10-CM | POA: Diagnosis not present

## 2023-09-29 DIAGNOSIS — D631 Anemia in chronic kidney disease: Secondary | ICD-10-CM | POA: Diagnosis not present

## 2023-09-29 DIAGNOSIS — D509 Iron deficiency anemia, unspecified: Secondary | ICD-10-CM | POA: Diagnosis not present

## 2023-09-29 DIAGNOSIS — N2581 Secondary hyperparathyroidism of renal origin: Secondary | ICD-10-CM | POA: Diagnosis not present

## 2023-09-30 ENCOUNTER — Encounter (HOSPITAL_COMMUNITY): Payer: Self-pay

## 2023-09-30 ENCOUNTER — Ambulatory Visit (HOSPITAL_COMMUNITY): Admission: RE | Admit: 2023-09-30 | Payer: 59 | Source: Ambulatory Visit

## 2023-10-01 DIAGNOSIS — N186 End stage renal disease: Secondary | ICD-10-CM | POA: Diagnosis not present

## 2023-10-01 DIAGNOSIS — Z418 Encounter for other procedures for purposes other than remedying health state: Secondary | ICD-10-CM | POA: Diagnosis not present

## 2023-10-01 DIAGNOSIS — D631 Anemia in chronic kidney disease: Secondary | ICD-10-CM | POA: Diagnosis not present

## 2023-10-01 DIAGNOSIS — N2581 Secondary hyperparathyroidism of renal origin: Secondary | ICD-10-CM | POA: Diagnosis not present

## 2023-10-03 DIAGNOSIS — D631 Anemia in chronic kidney disease: Secondary | ICD-10-CM | POA: Diagnosis not present

## 2023-10-03 DIAGNOSIS — N186 End stage renal disease: Secondary | ICD-10-CM | POA: Diagnosis not present

## 2023-10-03 DIAGNOSIS — Z418 Encounter for other procedures for purposes other than remedying health state: Secondary | ICD-10-CM | POA: Diagnosis not present

## 2023-10-03 DIAGNOSIS — N2581 Secondary hyperparathyroidism of renal origin: Secondary | ICD-10-CM | POA: Diagnosis not present

## 2023-10-06 DIAGNOSIS — D631 Anemia in chronic kidney disease: Secondary | ICD-10-CM | POA: Diagnosis not present

## 2023-10-06 DIAGNOSIS — N2581 Secondary hyperparathyroidism of renal origin: Secondary | ICD-10-CM | POA: Diagnosis not present

## 2023-10-06 DIAGNOSIS — Z418 Encounter for other procedures for purposes other than remedying health state: Secondary | ICD-10-CM | POA: Diagnosis not present

## 2023-10-06 DIAGNOSIS — N186 End stage renal disease: Secondary | ICD-10-CM | POA: Diagnosis not present

## 2023-10-08 DIAGNOSIS — N2581 Secondary hyperparathyroidism of renal origin: Secondary | ICD-10-CM | POA: Diagnosis not present

## 2023-10-08 DIAGNOSIS — Z418 Encounter for other procedures for purposes other than remedying health state: Secondary | ICD-10-CM | POA: Diagnosis not present

## 2023-10-08 DIAGNOSIS — D631 Anemia in chronic kidney disease: Secondary | ICD-10-CM | POA: Diagnosis not present

## 2023-10-08 DIAGNOSIS — N186 End stage renal disease: Secondary | ICD-10-CM | POA: Diagnosis not present

## 2023-10-10 DIAGNOSIS — Z418 Encounter for other procedures for purposes other than remedying health state: Secondary | ICD-10-CM | POA: Diagnosis not present

## 2023-10-10 DIAGNOSIS — D631 Anemia in chronic kidney disease: Secondary | ICD-10-CM | POA: Diagnosis not present

## 2023-10-10 DIAGNOSIS — N186 End stage renal disease: Secondary | ICD-10-CM | POA: Diagnosis not present

## 2023-10-10 DIAGNOSIS — N2581 Secondary hyperparathyroidism of renal origin: Secondary | ICD-10-CM | POA: Diagnosis not present

## 2023-10-15 ENCOUNTER — Encounter: Payer: Self-pay | Admitting: Physical Therapy

## 2023-10-21 ENCOUNTER — Ambulatory Visit (HOSPITAL_COMMUNITY): Admission: RE | Admit: 2023-10-21 | Payer: 59 | Source: Ambulatory Visit

## 2023-10-21 DIAGNOSIS — J810 Acute pulmonary edema: Secondary | ICD-10-CM | POA: Diagnosis not present

## 2023-10-28 ENCOUNTER — Telehealth (HOSPITAL_COMMUNITY): Payer: Self-pay

## 2023-10-28 NOTE — Telephone Encounter (Signed)
Called to reschedule cath removal, no answer, left vm. AB

## 2023-11-01 ENCOUNTER — Observation Stay (HOSPITAL_COMMUNITY)
Admission: EM | Admit: 2023-11-01 | Discharge: 2023-11-02 | Disposition: A | Discharge status: Home / Self Care | Payer: 59 | Attending: Internal Medicine | Admitting: Internal Medicine

## 2023-11-01 ENCOUNTER — Other Ambulatory Visit: Payer: Self-pay

## 2023-11-01 ENCOUNTER — Emergency Department (HOSPITAL_COMMUNITY): Payer: 59

## 2023-11-01 DIAGNOSIS — I10 Essential (primary) hypertension: Secondary | ICD-10-CM | POA: Diagnosis present

## 2023-11-01 DIAGNOSIS — Z992 Dependence on renal dialysis: Secondary | ICD-10-CM | POA: Insufficient documentation

## 2023-11-01 DIAGNOSIS — E119 Type 2 diabetes mellitus without complications: Secondary | ICD-10-CM | POA: Diagnosis not present

## 2023-11-01 DIAGNOSIS — E11649 Type 2 diabetes mellitus with hypoglycemia without coma: Secondary | ICD-10-CM | POA: Diagnosis not present

## 2023-11-01 DIAGNOSIS — J9601 Acute respiratory failure with hypoxia: Secondary | ICD-10-CM | POA: Diagnosis not present

## 2023-11-01 DIAGNOSIS — N186 End stage renal disease: Secondary | ICD-10-CM | POA: Diagnosis present

## 2023-11-01 DIAGNOSIS — Z1152 Encounter for screening for COVID-19: Secondary | ICD-10-CM | POA: Diagnosis not present

## 2023-11-01 DIAGNOSIS — Z7901 Long term (current) use of anticoagulants: Secondary | ICD-10-CM | POA: Insufficient documentation

## 2023-11-01 DIAGNOSIS — F17210 Nicotine dependence, cigarettes, uncomplicated: Secondary | ICD-10-CM | POA: Diagnosis not present

## 2023-11-01 DIAGNOSIS — I120 Hypertensive chronic kidney disease with stage 5 chronic kidney disease or end stage renal disease: Secondary | ICD-10-CM | POA: Insufficient documentation

## 2023-11-01 DIAGNOSIS — D72829 Elevated white blood cell count, unspecified: Secondary | ICD-10-CM | POA: Diagnosis present

## 2023-11-01 DIAGNOSIS — Z79899 Other long term (current) drug therapy: Secondary | ICD-10-CM | POA: Insufficient documentation

## 2023-11-01 DIAGNOSIS — Z8673 Personal history of transient ischemic attack (TIA), and cerebral infarction without residual deficits: Secondary | ICD-10-CM | POA: Diagnosis not present

## 2023-11-01 DIAGNOSIS — E875 Hyperkalemia: Secondary | ICD-10-CM | POA: Diagnosis not present

## 2023-11-01 DIAGNOSIS — I480 Paroxysmal atrial fibrillation: Secondary | ICD-10-CM | POA: Diagnosis not present

## 2023-11-01 DIAGNOSIS — J810 Acute pulmonary edema: Secondary | ICD-10-CM | POA: Diagnosis not present

## 2023-11-01 DIAGNOSIS — E8770 Fluid overload, unspecified: Secondary | ICD-10-CM | POA: Diagnosis not present

## 2023-11-01 DIAGNOSIS — N189 Chronic kidney disease, unspecified: Secondary | ICD-10-CM

## 2023-11-01 DIAGNOSIS — D631 Anemia in chronic kidney disease: Secondary | ICD-10-CM | POA: Diagnosis not present

## 2023-11-01 DIAGNOSIS — R7989 Other specified abnormal findings of blood chemistry: Secondary | ICD-10-CM | POA: Diagnosis not present

## 2023-11-01 DIAGNOSIS — Z862 Personal history of diseases of the blood and blood-forming organs and certain disorders involving the immune mechanism: Secondary | ICD-10-CM

## 2023-11-01 DIAGNOSIS — R0602 Shortness of breath: Secondary | ICD-10-CM | POA: Diagnosis present

## 2023-11-01 DIAGNOSIS — E1122 Type 2 diabetes mellitus with diabetic chronic kidney disease: Secondary | ICD-10-CM | POA: Insufficient documentation

## 2023-11-01 LAB — PHOSPHORUS: Phosphorus: 6.8 mg/dL — ABNORMAL HIGH (ref 2.5–4.6)

## 2023-11-01 LAB — CBC WITH DIFFERENTIAL/PLATELET
Abs Immature Granulocytes: 0.03 10*3/uL (ref 0.00–0.07)
Basophils Absolute: 0.1 10*3/uL (ref 0.0–0.1)
Basophils Relative: 1 %
Eosinophils Absolute: 0.3 10*3/uL (ref 0.0–0.5)
Eosinophils Relative: 3 %
HCT: 30 % — ABNORMAL LOW (ref 36.0–46.0)
Hemoglobin: 9.5 g/dL — ABNORMAL LOW (ref 12.0–15.0)
Immature Granulocytes: 0 %
Lymphocytes Relative: 19 %
Lymphs Abs: 2.1 10*3/uL (ref 0.7–4.0)
MCH: 25.3 pg — ABNORMAL LOW (ref 26.0–34.0)
MCHC: 31.7 g/dL (ref 30.0–36.0)
MCV: 80 fL (ref 80.0–100.0)
Monocytes Absolute: 0.6 10*3/uL (ref 0.1–1.0)
Monocytes Relative: 5 %
Neutro Abs: 7.9 10*3/uL — ABNORMAL HIGH (ref 1.7–7.7)
Neutrophils Relative %: 72 %
Platelets: 233 10*3/uL (ref 150–400)
RBC: 3.75 MIL/uL — ABNORMAL LOW (ref 3.87–5.11)
RDW: 16.8 % — ABNORMAL HIGH (ref 11.5–15.5)
WBC: 10.9 10*3/uL — ABNORMAL HIGH (ref 4.0–10.5)
nRBC: 0 % (ref 0.0–0.2)

## 2023-11-01 LAB — COMPREHENSIVE METABOLIC PANEL
ALT: 15 U/L (ref 0–44)
AST: 22 U/L (ref 15–41)
Albumin: 3.4 g/dL — ABNORMAL LOW (ref 3.5–5.0)
Alkaline Phosphatase: 64 U/L (ref 38–126)
Anion gap: 17 — ABNORMAL HIGH (ref 5–15)
BUN: 49 mg/dL — ABNORMAL HIGH (ref 6–20)
CO2: 24 mmol/L (ref 22–32)
Calcium: 8.3 mg/dL — ABNORMAL LOW (ref 8.9–10.3)
Chloride: 100 mmol/L (ref 98–111)
Creatinine, Ser: 7.73 mg/dL — ABNORMAL HIGH (ref 0.44–1.00)
GFR, Estimated: 6 mL/min — ABNORMAL LOW (ref >60)
Glucose, Bld: 98 mg/dL (ref 70–99)
Potassium: 6.7 mmol/L (ref 3.5–5.1)
Sodium: 141 mmol/L (ref 135–145)
Total Bilirubin: 0.8 mg/dL (ref 0.0–1.2)
Total Protein: 7.5 g/dL (ref 6.5–8.1)

## 2023-11-01 LAB — I-STAT CHEM 8, ED
BUN: 45 mg/dL — ABNORMAL HIGH (ref 6–20)
Calcium, Ion: 0.97 mmol/L — ABNORMAL LOW (ref 1.15–1.40)
Chloride: 103 mmol/L (ref 98–111)
Creatinine, Ser: 8.2 mg/dL — ABNORMAL HIGH (ref 0.44–1.00)
Glucose, Bld: 98 mg/dL (ref 70–99)
HCT: 33 % — ABNORMAL LOW (ref 36.0–46.0)
Hemoglobin: 11.2 g/dL — ABNORMAL LOW (ref 12.0–15.0)
Potassium: 6.6 mmol/L (ref 3.5–5.1)
Sodium: 139 mmol/L (ref 135–145)
TCO2: 28 mmol/L (ref 22–32)

## 2023-11-01 LAB — CBG MONITORING, ED
Glucose-Capillary: 143 mg/dL — ABNORMAL HIGH (ref 70–99)
Glucose-Capillary: 32 mg/dL — CL (ref 70–99)

## 2023-11-01 LAB — RESP PANEL BY RT-PCR (RSV, FLU A&B, COVID)  RVPGX2
Influenza A by PCR: NEGATIVE
Influenza B by PCR: NEGATIVE
Resp Syncytial Virus by PCR: NEGATIVE
SARS Coronavirus 2 by RT PCR: NEGATIVE

## 2023-11-01 LAB — TROPONIN I (HIGH SENSITIVITY)
Troponin I (High Sensitivity): 85 ng/L — ABNORMAL HIGH (ref <18)
Troponin I (High Sensitivity): 96 ng/L — ABNORMAL HIGH (ref <18)

## 2023-11-01 LAB — BRAIN NATRIURETIC PEPTIDE: B Natriuretic Peptide: 2761.9 pg/mL — ABNORMAL HIGH (ref 0.0–100.0)

## 2023-11-01 MED ORDER — CALCIUM GLUCONATE-NACL 1-0.675 GM/50ML-% IV SOLN
1.0000 g | Freq: Once | INTRAVENOUS | Status: AC
  Administered 2023-11-01: 1000 mg via INTRAVENOUS
  Filled 2023-11-01: qty 50

## 2023-11-01 MED ORDER — CHLORHEXIDINE GLUCONATE CLOTH 2 % EX PADS: 6.0000 | MEDICATED_PAD | Freq: Every day | CUTANEOUS | Status: DC

## 2023-11-01 MED ORDER — INSULIN ASPART 100 UNIT/ML IV SOLN
5.0000 [IU] | Freq: Once | INTRAVENOUS | Status: AC
  Administered 2023-11-01: 5 [IU] via INTRAVENOUS

## 2023-11-01 MED ORDER — MELATONIN 3 MG PO TABS: 3.0000 mg | ORAL_TABLET | Freq: Every evening | ORAL | Status: DC | PRN

## 2023-11-01 MED ORDER — ACETAMINOPHEN 325 MG PO TABS: 650.0000 mg | ORAL_TABLET | Freq: Four times a day (QID) | ORAL | Status: DC | PRN

## 2023-11-01 MED ORDER — ACETAMINOPHEN 650 MG RE SUPP: 650.0000 mg | Freq: Four times a day (QID) | RECTAL | Status: DC | PRN

## 2023-11-01 MED ORDER — ONDANSETRON HCL 4 MG/2ML IJ SOLN: 4.0000 mg | Freq: Four times a day (QID) | INTRAMUSCULAR | Status: DC | PRN

## 2023-11-01 MED ORDER — DEXTROSE 50 % IV SOLN
INTRAVENOUS | Status: AC
  Filled 2023-11-01: qty 50

## 2023-11-01 MED ORDER — DEXTROSE 50 % IV SOLN
50.0000 mL | Freq: Once | INTRAVENOUS | Status: AC
Start: 2023-11-01 — End: 2023-11-01
  Administered 2023-11-01: 50 mL via INTRAVENOUS

## 2023-11-01 MED ORDER — DEXTROSE 50 % IV SOLN
1.0000 | Freq: Once | INTRAVENOUS | Status: AC
  Administered 2023-11-01: 50 mL via INTRAVENOUS
  Filled 2023-11-01: qty 50

## 2023-11-01 MED ORDER — SODIUM ZIRCONIUM CYCLOSILICATE 10 G PO PACK
10.0000 g | PACK | Freq: Once | ORAL | Status: AC
  Administered 2023-11-01: 10 g via ORAL
  Filled 2023-11-01: qty 1

## 2023-11-01 NOTE — ED Notes (Signed)
 Pt given [place] sandwich and orange juice. Remained alert and oriented.

## 2023-11-01 NOTE — ED Provider Notes (Signed)
 Patient assessed by myself.  She is not in distress.  Potassium is 6.7.  She has already been started on temporizing medicines by Dr. Earlene Plater.  Will admit to the hospitalist service.   Pricilla Loveless, MD 11/01/23 [phone]

## 2023-11-01 NOTE — ED Notes (Signed)
 Admitting provider at bedside.

## 2023-11-01 NOTE — ED Notes (Signed)
Patient transported to dialysis via stretcher.

## 2023-11-01 NOTE — ED Notes (Signed)
 IV attempt x2 unsuccessful. IV team consult ordered.

## 2023-11-01 NOTE — ED Notes (Signed)
 Phlebotomy attempted to collect labs, unable to collect

## 2023-11-01 NOTE — ED Provider Notes (Signed)
 [place] EMERGENCY DEPARTMENT AT [facility] Provider Note   CSN: [id number] Arrival date & time: 11/01/23  1817     History {Add pertinent medical, surgical, social history, OB history to HPI:1} Chief Complaint  Patient presents with   Shortness of Breath    Stacey Lucas is a 48 y.o. female.   Shortness of Breath 48 year old female history of ESRD on hemodialysis presented for shortness of breath.  Patient states she normally is on dialysis Monday Wednesday Friday.  She states yesterday she was discharged after admission at [facility] for shortness of breath.  She states today she is felt more short of breath.  Maybe some chest discomfort but no frank pain.  No nausea or vomiting.  No fevers or chills.  She feels like she might have too much fluid on her.  She still occasionally makes some urine.  She is history of type 2 diabetes, gout, hypertension as well.  She has not had dialysis since Thursday.     Home Medications Prior to Admission medications   Medication Sig Start Date End Date Taking? Authorizing Provider  amLODipine (NORVASC) 10 MG tablet Take 1 tablet (10 mg total) by mouth daily. 08/08/23   Mesner, Barbara Cower, MD  apixaban (ELIQUIS) 2.5 MG TABS tablet Take 1 tablet (2.5 mg total) by mouth 2 (two) times daily. 08/08/23 09/17/23  Mesner, Barbara Cower, MD  carvedilol (COREG) 6.25 MG tablet Take 0.5 tablets (3.125 mg total) by mouth 2 (two) times daily with a meal. 08/08/23 09/07/23  Mesner, Barbara Cower, MD  predniSONE (DELTASONE) 20 MG tablet 2 tabs po daily x 4 days 08/08/23   Mesner, Barbara Cower, MD  sevelamer carbonate (RENVELA) 800 MG tablet Take 800 mg by mouth 3 (three) times daily with meals. 05/15/23 05/14/24  [provider]      Allergies    Neurontin [gabapentin]    Review of Systems   Review of Systems  Respiratory:  Positive for shortness of breath.   Review of systems completed and notable as per HPI.  ROS otherwise negative.   Physical Exam Updated Vital  Signs BP (!) 184/80 (BP Location: Right Arm)   Pulse (!) 102   Temp 98.8 F (37.1 C) (Oral)   Resp (!) 22   Ht 5\' 5"  (1.651 m)   Wt 61.2 kg   LMP 10/11/2019   SpO2 96%   BMI 22.47 kg/m  Physical Exam Vitals and nursing note reviewed.  Constitutional:      General: She is not in acute distress.    Appearance: She is well-developed.  HENT:     Head: Normocephalic and atraumatic.     Mouth/Throat:     Mouth: Mucous membranes are moist.     Pharynx: Oropharynx is clear.  Eyes:     Extraocular Movements: Extraocular movements intact.     Conjunctiva/sclera: Conjunctivae normal.     Pupils: Pupils are equal, round, and reactive to light.  Cardiovascular:     Rate and Rhythm: Normal rate and regular rhythm.     Pulses: Normal pulses.     Heart sounds: Normal heart sounds. No murmur heard. Pulmonary:     Effort: Pulmonary effort is normal. No respiratory distress.     Breath sounds: Examination of the right-lower field reveals rales. Examination of the left-lower field reveals rales. Rales present.  Abdominal:     Palpations: Abdomen is soft.     Tenderness: There is no abdominal tenderness.  Musculoskeletal:        General: No  swelling.     Cervical back: Neck supple.     Right lower leg: No edema.     Left lower leg: No edema.  Skin:    General: Skin is warm and dry.     Capillary Refill: Capillary refill takes less than 2 seconds.  Neurological:     General: No focal deficit present.     Mental Status: She is alert and oriented to person, place, and time. Mental status is at baseline.  Psychiatric:        Mood and Affect: Mood normal.     ED Results / Procedures / Treatments   Labs (all labs ordered are listed, but only abnormal results are displayed) Labs Reviewed - No data to display  EKG None  Radiology No results found.  Procedures Procedures  {Document cardiac monitor, telemetry assessment procedure when appropriate:1}  Medications Ordered in  ED Medications - No data to display  ED Course/ Medical Decision Making/ A&P   {   Click here for ABCD2, HEART and other calculatorsREFRESH Note before signing :1}                              Medical Decision Making  Medical Decision Making:   Stacey Lucas is a 48 y.o. female who presented to the ED today with shortness of breath.  Has been worsened today but she was recently admitted to [facility] for the same.  On exam she has bibasilar Rales concerning for volume overload.  EKG with nonspecific inferolateral T wave normality, although not significantly changed.   {crccomplexity:27900} Reviewed and confirmed nursing documentation for past medical history, family history, social history.  Reassessment and Plan:   ***    Patient's presentation is most consistent with {EM COPA:27473}     {Document critical care time when appropriate:1} {Document review of labs and clinical decision tools ie heart score, Chads2Vasc2 etc:1}  {Document your independent review of radiology images, and any outside records:1} {Document your discussion with family members, caretakers, and with consultants:1} {Document social determinants of health affecting pt's care:1} {Document your decision making why or why not admission, treatments were needed:1} Final Clinical Impression(s) / ED Diagnoses Final diagnoses:  None    Rx / DC Orders ED Discharge Orders     None

## 2023-11-01 NOTE — ED Triage Notes (Signed)
 Patient from motel via EMS for eval of sob and weakness onset this morning around 0900. Discharged yesterday from 3 day admission at [facility] for similar presentation. 5mg  albuterol by FD when they noted wheezing and SpO2 82%. Placed on 4L O2 by EMS. Dry cough x 1 week. Dialysis patient and last treatment was Thursday while she was in the hospital. Normally MWF schedule.

## 2023-11-01 NOTE — Consult Note (Signed)
 Renal Service Consult Note [facility]  Stacey Lucas 11/01/2023 Stacey Krabbe, MD Requesting Physician: Dr. Adelina Mings  Reason for Consult: ESRD pt w/ SOB, gen weakness.  HPI: The patient is a 48 y.o. year-old w/ PMH as below who presented to ED for SOB. SpO2 was 82% and pt was wheezing, 4 L O2 placed by EMS. Dry cough x 1 week. Hx of substance abuse in the past.  Last HD was Thursday while in hospital at [facility]. Schedule is usually MWF.   In ED BP 180/ 890, HR 103, RR 20-22.  Temp 98.8.  2-4 L [place] O2. CXR shows IS and alveolar edema. Labs showed K+ 6.6. Getting some temporizing measures in ED. Pt is to be admitted. We are asked to see for dialysis.    Pt seen in ED. Pt was getting HD in [facility] but recently "switched back" to [facility]. She states she went 4 days in a row this week for dialysis from Mon to Thursday. She states she doesn't know why they wanted her to come all these days. She is homeless now. Her AVF they have been using and the [facility] is ready to come out. She would like [place] to use her AVF while she is here.    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 12/29/2021   Anxiety and depression    Atrial fibrillation (HCC)    Cerebral thrombosis with cerebral infarction 05/24/2021   Cocaine abuse (HCC)    Depression with suicidal ideation    [facility] admission 04/2018   Diabetes mellitus without complication (HCC)    Type II   End stage kidney disease (HCC)    GERD (gastroesophageal reflux disease)    Hyperlipidemia    Hypertension    Impingement syndrome of right shoulder    Migraine headache     Osteoarthritis of right knee 05/24/2021   Pilonidal abscess 05/2013   Pyelonephritis    Right thyroid nodule    Sciatica    Past Surgical History  Past Surgical History:  Procedure Laterality Date   AV FISTULA PLACEMENT Left 03/17/2023   Procedure: LEFT ARM BRACHIOCEPHALIC ARTERIOVENOUS (AV) FISTULA CREATION;  Surgeon: Victorino Sparrow, MD;  Location: [facility] OR;  Service: Vascular;  Laterality: Left;   BUBBLE STUDY  01/01/2022   Procedure: BUBBLE STUDY;  Surgeon: Maisie Fus, MD;  Location: [facility] ENDOSCOPY;  Service: Cardiovascular;;   IR FLUORO GUIDE CV LINE RIGHT  03/14/2023   IR US GUIDE VASC ACCESS RIGHT  03/14/2023   TEE WITHOUT CARDIOVERSION N/A 01/01/2022   Procedure: TRANSESOPHAGEAL ECHOCARDIOGRAM (TEE);  Surgeon: Maisie Fus, MD;  Location: [facility] ENDOSCOPY;  Service: Cardiovascular;  Laterality: N/A;   tubal     TUBAL LIGATION     Family History  Family History  Problem Relation Age of Onset   Diabetes Mother    Hypertension Mother    Arthritis Mother    Diabetes Father    Hypertension Father    Social History  reports that she has been smoking cigarettes. She has been exposed to tobacco smoke. She has never used smokeless tobacco. She reports  current alcohol use. She reports that she does not use drugs. Allergies  Allergies  Allergen Reactions   Neurontin [Gabapentin] Other (See Comments)    Weakness  Balance impairment    Home medications Prior to Admission medications   Medication Sig Start Date End Date Taking? Authorizing Provider  amLODipine (NORVASC) 10 MG tablet Take 1 tablet (10 mg total) by mouth daily. 08/08/23   Mesner, Barbara Cower, MD  apixaban (ELIQUIS) 2.5 MG TABS tablet Take 1 tablet (2.5 mg total) by mouth 2 (two) times daily. 08/08/23 09/17/23  Mesner, Barbara Cower, MD  carvedilol (COREG) 6.25 MG tablet Take 0.5 tablets (3.125 mg total) by mouth 2 (two) times daily with a meal. 08/08/23 09/07/23  Mesner, Barbara Cower, MD  predniSONE (DELTASONE) 20 MG tablet 2 tabs po daily  x 4 days 08/08/23   Mesner, Barbara Cower, MD  sevelamer carbonate (RENVELA) 800 MG tablet Take 800 mg by mouth 3 (three) times daily with meals. 05/15/23 05/14/24  [provider]     Vitals:   11/01/23 1826 11/01/23 1937 11/01/23 1939 11/01/23 1947  BP:  (!) 176/92    Pulse:  (!) 101 (!) 103 (!) 104  Resp:  20 20   Temp:      TempSrc:      SpO2:  96% 92% 97%  Weight: 61.2 kg     Height: 5\' 5"  (1.651 m)      Exam Gen alert, no distress, coughing off and on Looks like she is holding her breath No rash, cyanosis or gangrene Sclera anicteric, throat clear  No jvd or bruits Chest fine bibasilar crackles, no bronchial BS RRR no MRG Abd soft ntnd no mass or ascites +bs GU defer MS no joint effusions or deformity Ext mild 1+ pretib LE edema, no other edema Neuro is alert, Ox 3 , nf    LUA AVF+bruit/  RIJ TDC     Renal-related home meds: - norvasc 10 every day - coreg 6.25 bid - renvela 800 ac tid - others: prednisone taper, eliquis    OP HD: [facility] , need info   BP 186/92  , HR 100-110, RR 20-23, temp 98.8    Assessment/ Plan: Acute hypoxic resp failure - w/ high BP's and sig SOB. CXR shows interstitial and alveolar edema. Has had this before here. Plan is for HD tonight.  ESRD - on HD MWF. States she had 4 sessions this week already. Not sure why.  HTN - poorly controlled. Get vol down w/ HD. Cont home meds.  Volume - as above Anemia of esrd - Hb 11, no esa needs.  Secondary hyperparathyroidism - add on phos and calcium.  H/o CVA DM2 Atrial fib      Stacey Moselle  MD [facility] 11/01/2023, 8:57 PM  Recent Labs  Lab 11/01/23 2043  HGB 11.2*  CREATININE 8.20*  K 6.6*   Inpatient medications:  insulin aspart  5 Units Intravenous Once   And   dextrose  1 ampule Intravenous Once   sodium zirconium cyclosilicate  10 g Oral Once    calcium gluconate

## 2023-11-01 NOTE — H&P (Signed)
 History and Physical      [name]:[id number] DOB: [birth year] DOA: 11/01/2023; DOS: 11/01/2023  PCP: Storm Frisk, MD *** Patient coming from: home ***  I have personally briefly reviewed patient's old medical records in [facility]  Chief Complaint: ***  HPI: Stacey Lucas is a 48 y.o. female with medical history significant for *** who is admitted to [facility] on 11/01/2023 with *** after presenting from home*** to [facility] ED complaining of ***.    ***       ***   ED Course:  Vital signs in the ED were notable for the following: ***  Labs were notable for the following: ***  Per my interpretation, EKG in ED demonstrated the following:  ***  Imaging in the ED, per corresponding formal radiology read, was notable for the following:  ***  While in the ED, the following were administered: ***  Subsequently, the patient was admitted  ***  ***red    Review of Systems: As per HPI otherwise 10 point review of systems negative.   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 12/29/2021   Anxiety and depression    Atrial fibrillation (HCC)    Cerebral thrombosis with cerebral infarction 05/24/2021   Cocaine abuse (HCC)    Depression with suicidal ideation    [facility] admission 04/2018   Diabetes mellitus without complication (HCC)    Type II   End stage kidney disease (HCC)    GERD (gastroesophageal reflux disease)    Hyperlipidemia    Hypertension    Impingement syndrome of right shoulder    Migraine headache    Osteoarthritis of right knee 05/24/2021   Pilonidal abscess 05/2013   Pyelonephritis    Right thyroid nodule    Sciatica     Past Surgical History:  Procedure Laterality Date   AV FISTULA PLACEMENT Left 03/17/2023   Procedure: LEFT ARM BRACHIOCEPHALIC ARTERIOVENOUS (AV) FISTULA CREATION;  Surgeon: Victorino Sparrow, MD;  Location:  [facility] OR;  Service: Vascular;  Laterality: Left;   BUBBLE STUDY  01/01/2022   Procedure: BUBBLE STUDY;  Surgeon: Maisie Fus, MD;  Location: [facility] ENDOSCOPY;  Service: Cardiovascular;;   IR FLUORO GUIDE CV LINE RIGHT  03/14/2023   IR US GUIDE VASC ACCESS RIGHT  03/14/2023   TEE WITHOUT CARDIOVERSION N/A 01/01/2022   Procedure: TRANSESOPHAGEAL ECHOCARDIOGRAM (TEE);  Surgeon: Maisie Fus, MD;  Location: [facility] ENDOSCOPY;  Service: Cardiovascular;  Laterality: N/A;   tubal     TUBAL LIGATION      Social History:  reports that she has been smoking cigarettes. She has been exposed to tobacco smoke. She has never used smokeless tobacco. She reports current alcohol use. She reports that she does not use drugs.   Allergies  Allergen Reactions   Neurontin [Gabapentin] Other (See Comments)    Weakness  Balance impairment     Family History  Problem Relation Age of Onset   Diabetes Mother    Hypertension Mother    Arthritis Mother    Diabetes Father    Hypertension Father     Family history reviewed and not pertinent ***   Prior to Admission medications   Medication Sig Start Date End Date Taking? Authorizing Provider  amLODipine (NORVASC) 10 MG tablet Take 1 tablet (10 mg total) by mouth daily. 08/08/23   Mesner, Barbara Cower, MD  apixaban (ELIQUIS) 2.5 MG TABS tablet  Take 1 tablet (2.5 mg total) by mouth 2 (two) times daily. 08/08/23 09/17/23  Mesner, Barbara Cower, MD  carvedilol (COREG) 6.25 MG tablet Take 0.5 tablets (3.125 mg total) by mouth 2 (two) times daily with a meal. 08/08/23 09/07/23  Mesner, Barbara Cower, MD  predniSONE (DELTASONE) 20 MG tablet 2 tabs po daily x 4 days 08/08/23   Mesner, Barbara Cower, MD  sevelamer carbonate (RENVELA) 800 MG tablet Take 800 mg by mouth 3 (three) times daily with meals. 05/15/23 05/14/24  [provider]     Objective    Physical Exam: Vitals:   11/01/23 2200 11/01/23 2230 11/01/23 2252 11/01/23 2254  BP: (!) 198/88 (!) 168/70 (!) 168/70   Pulse: (!) 104 (!)  102 (!) 102   Resp:   16   Temp:    98.9 F (37.2 C)  TempSrc:    Oral  SpO2: 97% 98% 97%   Weight:      Height:        General: appears to be stated age; alert, oriented Skin: warm, dry, no rash Head:  AT/[place] Mouth:  Oral mucosa membranes appear moist, normal dentition Neck: supple; trachea midline Heart:  RRR; did not appreciate any M/R/G Lungs: CTAB, did not appreciate any wheezes, rales, or rhonchi Abdomen: + BS; soft, ND, NT Vascular: 2+ pedal pulses b/l; 2+ radial pulses b/l Extremities: no peripheral edema, no muscle wasting Neuro: strength and sensation intact in upper and lower extremities b/l ***   *** Neuro: 5/5 strength of the proximal and distal flexors and extensors of the upper and lower extremities bilaterally; sensation intact in upper and lower extremities b/l; cranial nerves II through XII grossly intact; no pronator drift; no evidence suggestive of slurred speech, dysarthria, or facial droop; Normal muscle tone. No tremors.  *** Neuro: In the setting of the patient's current mental status and associated inability to follow instructions, unable to perform full neurologic exam at this time.  As such, assessment of strength, sensation, and cranial nerves is limited at this time. Patient noted to spontaneously move all 4 extremities. No tremors.  ***    Labs on Admission: I have personally reviewed following labs and imaging studies  CBC: Recent Labs  Lab 11/01/23 1915 11/01/23 2043  WBC 10.9*  --   NEUTROABS 7.9*  --   HGB 9.5* 11.2*  HCT 30.0* 33.0*  MCV 80.0  --   PLT 233  --    Basic Metabolic Panel: Recent Labs  Lab 11/01/23 1915 11/01/23 2043 11/01/23 2112  NA 141 139  --   K 6.7* 6.6*  --   CL 100 103  --   CO2 24  --   --   GLUCOSE 98 98  --   BUN 49* 45*  --   CREATININE 7.73* 8.20*  --   CALCIUM 8.3*  --   --   PHOS  --   --  6.8*   GFR: Estimated Creatinine Clearance: 7.5 mL/min (A) (by C-G formula based on SCr of 8.2 mg/dL  (H)). Liver Function Tests: Recent Labs  Lab 11/01/23 1915  AST 22  ALT 15  ALKPHOS 64  BILITOT 0.8  PROT 7.5  ALBUMIN 3.4*   No results for input(s): "LIPASE", "AMYLASE" in the last 168 hours. No results for input(s): "AMMONIA" in the last 168 hours. Coagulation Profile: No results for input(s): "INR", "PROTIME" in the last 168 hours. Cardiac Enzymes: No results for input(s): "CKTOTAL", "CKMB", "CKMBINDEX", "TROPONINI" in the last 168 hours. BNP (last  3 results) No results for input(s): "PROBNP" in the last 8760 hours. HbA1C: No results for input(s): "HGBA1C" in the last 72 hours. CBG: No results for input(s): "GLUCAP" in the last 168 hours. Lipid Profile: No results for input(s): "CHOL", "HDL", "LDLCALC", "TRIG", "CHOLHDL", "LDLDIRECT" in the last 72 hours. Thyroid Function Tests: No results for input(s): "TSH", "T4TOTAL", "FREET4", "T3FREE", "THYROIDAB" in the last 72 hours. Anemia Panel: No results for input(s): "VITAMINB12", "FOLATE", "FERRITIN", "TIBC", "IRON", "RETICCTPCT" in the last 72 hours. Urine analysis:    Component Value Date/Time   COLORURINE AMBER (A) 11/14/2022 1902   APPEARANCEUR CLOUDY (A) 11/14/2022 1902   LABSPEC 1.018 11/14/2022 1902   PHURINE 7.0 11/14/2022 1902   GLUCOSEU NEGATIVE 11/14/2022 1902   HGBUR NEGATIVE 11/14/2022 1902   BILIRUBINUR NEGATIVE 11/14/2022 1902   KETONESUR 5 (A) 11/14/2022 1902   PROTEINUR >=300 (A) 11/14/2022 1902   NITRITE NEGATIVE 11/14/2022 1902   LEUKOCYTESUR MODERATE (A) 11/14/2022 1902    Radiological Exams on Admission: DG Chest Port 1 View Result Date: 11/01/2023 CLINICAL DATA:  Shortness of breath. EXAM: PORTABLE CHEST 1 VIEW COMPARISON:  chest radiographs 09/17/2023 and 08/08/2023 FINDINGS: Right internal jugular dual-lumen central venous dialysis access there catheter tips again overlie the superior right atrium. The cardiac silhouette is again moderately enlarged. Mediastinal contours are grossly within  normal limits. Bilateral perihilar heterogeneous airspace opacities and new moderate bilateral interstitial thickening with mid and lower lung predominance, new compared to most recent 09/17/2023 radiographs, but appearing more similar to 08/08/2023 radiograph. No definite pleural effusion. No pneumothorax. Mild dextrocurvature of the midthoracic spine is increased from prior, and likely accentuated by patient positioning. IMPRESSION: New moderate interstitial and alveolar pulmonary edema compared to 09/17/2023, more similar to 08/08/2023. Electronically Signed   By: Neita Garnet M.D.   On: 11/01/2023 20:05      Assessment/Plan   Principal Problem:   Hyperkalemia   ***            ***                ***                 ***               ***               ***               ***                ***               ***               ***               ***               ***              ***          ***  DVT prophylaxis: SCD's ***  Code Status: Full code*** Family Communication: none*** Disposition Plan: Per Rounding Team Consults called: none***;  Admission status: ***     I SPENT GREATER THAN 75 *** MINUTES IN CLINICAL CARE TIME/MEDICAL DECISION-MAKING IN COMPLETING THIS ADMISSION.      Chaney Born Aline Wesche DO [facility]  From 7PM - 7AM   11/01/2023, 11:08 PM   ***

## 2023-11-02 ENCOUNTER — Encounter (HOSPITAL_COMMUNITY): Payer: Self-pay | Admitting: Internal Medicine

## 2023-11-02 DIAGNOSIS — Z862 Personal history of diseases of the blood and blood-forming organs and certain disorders involving the immune mechanism: Secondary | ICD-10-CM

## 2023-11-02 DIAGNOSIS — J810 Acute pulmonary edema: Secondary | ICD-10-CM | POA: Diagnosis present

## 2023-11-02 DIAGNOSIS — E8770 Fluid overload, unspecified: Secondary | ICD-10-CM | POA: Diagnosis present

## 2023-11-02 DIAGNOSIS — R0602 Shortness of breath: Secondary | ICD-10-CM | POA: Diagnosis present

## 2023-11-02 DIAGNOSIS — R7989 Other specified abnormal findings of blood chemistry: Secondary | ICD-10-CM | POA: Diagnosis present

## 2023-11-02 DIAGNOSIS — E875 Hyperkalemia: Secondary | ICD-10-CM | POA: Diagnosis not present

## 2023-11-02 DIAGNOSIS — D72829 Elevated white blood cell count, unspecified: Secondary | ICD-10-CM | POA: Diagnosis present

## 2023-11-02 LAB — COMPREHENSIVE METABOLIC PANEL
ALT: 15 U/L (ref 0–44)
AST: 20 U/L (ref 15–41)
Albumin: 3.4 g/dL — ABNORMAL LOW (ref 3.5–5.0)
Alkaline Phosphatase: 64 U/L (ref 38–126)
Anion gap: 14 (ref 5–15)
BUN: 22 mg/dL — ABNORMAL HIGH (ref 6–20)
CO2: 30 mmol/L (ref 22–32)
Calcium: 9.1 mg/dL (ref 8.9–10.3)
Chloride: 92 mmol/L — ABNORMAL LOW (ref 98–111)
Creatinine, Ser: 4.48 mg/dL — ABNORMAL HIGH (ref 0.44–1.00)
GFR, Estimated: 11 mL/min — ABNORMAL LOW (ref >60)
Glucose, Bld: 153 mg/dL — ABNORMAL HIGH (ref 70–99)
Potassium: 3.8 mmol/L (ref 3.5–5.1)
Sodium: 136 mmol/L (ref 135–145)
Total Bilirubin: 1.2 mg/dL (ref 0.0–1.2)
Total Protein: 7.8 g/dL (ref 6.5–8.1)

## 2023-11-02 LAB — CBC WITH DIFFERENTIAL/PLATELET
Abs Immature Granulocytes: 0.04 10*3/uL (ref 0.00–0.07)
Basophils Absolute: 0.1 10*3/uL (ref 0.0–0.1)
Basophils Relative: 1 %
Eosinophils Absolute: 0.2 10*3/uL (ref 0.0–0.5)
Eosinophils Relative: 2 %
HCT: 29.7 % — ABNORMAL LOW (ref 36.0–46.0)
Hemoglobin: 9.7 g/dL — ABNORMAL LOW (ref 12.0–15.0)
Immature Granulocytes: 0 %
Lymphocytes Relative: 23 %
Lymphs Abs: 2.4 10*3/uL (ref 0.7–4.0)
MCH: 25.4 pg — ABNORMAL LOW (ref 26.0–34.0)
MCHC: 32.7 g/dL (ref 30.0–36.0)
MCV: 77.7 fL — ABNORMAL LOW (ref 80.0–100.0)
Monocytes Absolute: 0.5 10*3/uL (ref 0.1–1.0)
Monocytes Relative: 4 %
Neutro Abs: 7.3 10*3/uL (ref 1.7–7.7)
Neutrophils Relative %: 70 %
Platelets: 235 10*3/uL (ref 150–400)
RBC: 3.82 MIL/uL — ABNORMAL LOW (ref 3.87–5.11)
RDW: 16.7 % — ABNORMAL HIGH (ref 11.5–15.5)
WBC: 10.5 10*3/uL (ref 4.0–10.5)
nRBC: 0 % (ref 0.0–0.2)

## 2023-11-02 LAB — HEPATITIS PANEL, ACUTE
HCV Ab: NONREACTIVE
Hep A IgM: NONREACTIVE
Hep B C IgM: NONREACTIVE
Hepatitis B Surface Ag: NONREACTIVE

## 2023-11-02 LAB — CBG MONITORING, ED: Glucose-Capillary: 115 mg/dL — ABNORMAL HIGH (ref 70–99)

## 2023-11-02 LAB — TROPONIN I (HIGH SENSITIVITY): Troponin I (High Sensitivity): 114 ng/L (ref <18)

## 2023-11-02 LAB — PROCALCITONIN: Procalcitonin: 0.33 ng/mL

## 2023-11-02 LAB — HEPATITIS B SURFACE ANTIGEN: Hepatitis B Surface Ag: NONREACTIVE

## 2023-11-02 LAB — MAGNESIUM: Magnesium: 1.8 mg/dL (ref 1.7–2.4)

## 2023-11-02 LAB — PHOSPHORUS: Phosphorus: 4.3 mg/dL (ref 2.5–4.6)

## 2023-11-02 MED ORDER — AMLODIPINE BESYLATE 5 MG PO TABS
10.0000 mg | ORAL_TABLET | Freq: Every day | ORAL | Status: DC
  Administered 2023-11-02: 10 mg via ORAL
  Filled 2023-11-02: qty 2

## 2023-11-02 MED ORDER — SEVELAMER CARBONATE 800 MG PO TABS
800.0000 mg | ORAL_TABLET | Freq: Three times a day (TID) | ORAL | Status: DC
  Administered 2023-11-02: 800 mg via ORAL
  Filled 2023-11-02: qty 1

## 2023-11-02 MED ORDER — DEXTROSE 50 % IV SOLN: 1.0000 | INTRAVENOUS | Status: DC | PRN

## 2023-11-02 MED ORDER — CARVEDILOL 6.25 MG PO TABS: 6.2500 mg | ORAL_TABLET | Freq: Two times a day (BID) | ORAL | Status: DC

## 2023-11-02 MED ORDER — APIXABAN 2.5 MG PO TABS
2.5000 mg | ORAL_TABLET | Freq: Two times a day (BID) | ORAL | Status: DC
  Administered 2023-11-02: 2.5 mg via ORAL
  Filled 2023-11-02: qty 1

## 2023-11-02 MED ORDER — INSULIN ASPART 100 UNIT/ML IJ SOLN: 0.0000 [IU] | Freq: Three times a day (TID) | INTRAMUSCULAR | Status: DC

## 2023-11-02 NOTE — ED Notes (Signed)
 Pt provided discharge instructions and prescription information. Pt was given the opportunity to ask questions and questions were answered.

## 2023-11-02 NOTE — Progress Notes (Signed)
 [facility] Progress Note  Subjective:  Pt seen in room  3.5 L off w/ HD last night SOB better  Vitals:   11/02/23 0400 11/02/23 0623 11/02/23 0624 11/02/23 0700  BP: 134/78 (!) 145/96  (!) 156/78  Pulse:  94 93 90  Resp: 20 18    Temp: 99 F (37.2 C) 99 F (37.2 C)    TempSrc: Oral Oral    SpO2: 100% 98% 97% 96%  Weight:      Height:        Exam: Gen alert, no distress today, looks better, on RA No jvd or bruits Chest cta bilat  RRR no MRG Abd soft ntnd no mass or ascites +bs Ext no LE or UE edema Neuro is alert, Ox 3 , nf    LUA AVF+bruit/  RIJ TDC       Renal-related home meds: - norvasc 10 every day - coreg 6.25 bid - renvela 800 ac tid - others: prednisone taper, eliquis      OP HD: [facility] , need info    Assessment/ Plan: Acute hypoxic resp failure/ volume - w/ high BP's and sig SOB. CXR showed interstitial and alveolar edema. Had HD last night w/ 3.5 L off.  Was on 4 L -> 2 L -> room air this morning. Pt looks better. May be able to be dc'd, will d/w pmd.  ESRD - on HD MWF in [place]. Had HD here last night as above.  HTN - BP's better w/ home meds and vol removal.  Anemia of esrd - Hb 11, no esa needs.  Secondary hyperparathyroidism - CCa and phos are in range. Cont binders w/ meals H/o CVA DM2 Atrial fib         Vinson Moselle MD  [facility] 11/02/2023, 8:56 AM  Recent Labs  Lab 11/01/23 1915 11/01/23 2043 11/01/23 2112 11/02/23 0700  HGB 9.5* 11.2*  --  9.7*  ALBUMIN 3.4*  --   --  3.4*  CALCIUM 8.3*  --   --  9.1  PHOS  --   --  6.8* 4.3  CREATININE 7.73* 8.20*  --  4.48*  K 6.7* 6.6*  --  3.8   No results for input(s): "IRON", "TIBC", "FERRITIN" in the last 168 hours. Inpatient medications:  amLODipine  10 mg Oral Daily   apixaban  2.5 mg Oral BID   Chlorhexidine Gluconate Cloth  6 each Topical Q0600   insulin aspart  0-6 Units Subcutaneous TID WC   sevelamer carbonate  800 mg Oral TID WC     acetaminophen **OR** acetaminophen, dextrose, melatonin, ondansetron (ZOFRAN) IV

## 2023-11-02 NOTE — Progress Notes (Signed)
   11/02/23 0400  Vitals  Temp 99 F (37.2 C)  Temp Source Oral  BP 134/78  MAP (mmHg) 121  BP Location Right Arm  BP Method Automatic  Patient Position (if appropriate) Lying  Pulse Rate Source Monitor  ECG Heart Rate (!) 120  Resp 20  Oxygen Therapy  SpO2 100 %  O2 Device Room Air  During Treatment Monitoring  Blood Flow Rate (mL/min) 0 mL/min  Arterial Pressure (mmHg) 14.34 mmHg  Venous Pressure (mmHg) 116.76 mmHg  TMP (mmHg) 14.54 mmHg  Ultrafiltration Rate (mL/min) 1362 mL/min  Dialysate Flow Rate (mL/min) 300 ml/min  Dialysate Potassium Concentration 1  Dialysate Calcium Concentration 2.5  Duration of HD Treatment -hour(s) 3 hour(s)  Cumulative Fluid Removed (mL) per Treatment  3500.35  HD Safety Checks Performed Yes  Intra-Hemodialysis Comments Tx completed  Post Treatment  Dialyzer Clearance Lightly streaked  Liters Processed 72  Fluid Removed (mL) 3500 mL  Tolerated HD Treatment Yes  AVG/AVF Arterial Site Held (minutes) 7 minutes  AVG/AVF Venous Site Held (minutes) 7 minutes  Note  Patient Observations pt stable  Fistula / Graft Left Other (Comment) Arteriovenous fistula  Placement Date/Time: 03/17/23 1242   Placed prior to admission: No  Orientation: Left  Access Location: (c) Other (Comment)  Access Type: Arteriovenous fistula  Site Condition No complications  Fistula / Graft Assessment Present;Thrill;Bruit  Status Deaccessed  Needle Size 15   Pt tolerated treatment without complications UF,

## 2023-11-02 NOTE — Discharge Summary (Signed)
 Physician Discharge Summary  Stacey Lucas UJW:[id number] DOB: 1975/11/14 DOA: 11/01/2023  PCP: Storm Frisk, MD  Admit date: 11/01/2023 Discharge date: 11/02/2023  Admitted From: Home Disposition: Home  Recommendations for Outpatient Follow-up:  Follow up with PCP in 1 week  Outpatient follow-up with hemodialysis unit as scheduled.  Comply with medications and follow-up. Follow up in ED if symptoms worsen or new appear   Home Health: No Equipment/Devices: None  Discharge Condition: Stable CODE STATUS: DNR Diet recommendation: Heart healthy/renal hemodialysis diet  Brief/Interim Summary: 48 year old female with history of end-stage renal disease on hemodialysis, paroxysmal A-fib on Eliquis, diabetes mellitus type 2, essential hypertension, anemia of chronic disease presented with worsening shortness of breath after missing her most recent hemodialysis.  On presentation, she required supplemental oxygen with BNP of 2762 with high sensitive troponin of 85 and then 96.  COVID/influenza/RSV PCR negative.  Chest x-ray showed moderate interstitial and alveolar pulmonary edema.  Nephrology was consulted and patient underwent urgent hemodialysis.  Subsequently, her condition has improved and she is on room air.  She feels much better.  Nephrology has cleared her for discharge.  She will be discharged home today with close outpatient follow-up with PCP and hemodialysis unit as scheduled.  Discharge Diagnoses:   Acute respiratory failure with hypoxia Volume overload End-stage renal disease on hemodialysis -Presented with worsening shortness of breath after missing routine hemodialysis session as an outpatient.  Was found to be in volume overload.  Nephrology was consulted.  Patient underwent urgent hemodialysis.  Subsequently, her condition has improved and she is on room air.  She feels much better.  Nephrology has cleared her for discharge.  She will be discharged home today with close  outpatient follow-up with PCP and hemodialysis unit as scheduled.  Paroxysmal A-fib -Currently rate controlled.  Continue Coreg.  Continue Eliquis.  Outpatient follow-up with cardiology  Diabetes mellitus type 2 with hypoglycemia -Blood sugars have improved.  Outpatient follow-up with PCP.  Carb modified diet.  A1c 5.6 in October 2024  Hyperkalemia -Resolved  Hyperphosphatemia -Improved after hemodialysis.  Anemia of chronic disease -From chronic renal failure.  Hemoglobin currently stable.  Monitor intermittently as an outpatient.  Essential hypertension -Continue amlodipine, Coreg.  Outpatient follow-up with PCP  Discharge Instructions  Discharge Instructions     Diet - low sodium heart healthy   Complete by: As directed    Increase activity slowly   Complete by: As directed       Allergies as of 11/02/2023       Reactions   Neurontin [gabapentin] Other (See Comments)   Weakness  Balance impairment         Medication List     STOP taking these medications    Nicotine Step 2 14 MG/24HR patch Generic drug: nicotine       TAKE these medications    amLODipine 10 MG tablet Commonly known as: NORVASC Take 1 tablet (10 mg total) by mouth daily.   apixaban 2.5 MG Tabs tablet Commonly known as: ELIQUIS Take 1 tablet (2.5 mg total) by mouth 2 (two) times daily.   carvedilol 6.25 MG tablet Commonly known as: COREG Take 1 tablet (6.25 mg total) by mouth 2 (two) times daily with a meal.   nicotine polacrilex 4 MG gum Commonly known as: NICORETTE Take by mouth.   sevelamer carbonate 800 MG tablet Commonly known as: RENVELA Take 800 mg by mouth 3 (three) times daily with meals.        Follow-up Information  Storm Frisk, MD. Schedule an appointment as soon as possible for a visit in 1 week(s).   Specialty: Pulmonary Disease Contact information: [address] [place] [address] [phone]                 Allergies  Allergen Reactions   Neurontin [Gabapentin] Other (See Comments)    Weakness  Balance impairment     Consultations: Nephrology   Procedures/Studies: DG Chest Port 1 View Result Date: 11/01/2023 CLINICAL DATA:  Shortness of breath. EXAM: PORTABLE CHEST 1 VIEW COMPARISON:  chest radiographs 09/17/2023 and 08/08/2023 FINDINGS: Right internal jugular dual-lumen central venous dialysis access there catheter tips again overlie the superior right atrium. The cardiac silhouette is again moderately enlarged. Mediastinal contours are grossly within normal limits. Bilateral perihilar heterogeneous airspace opacities and new moderate bilateral interstitial thickening with mid and lower lung predominance, new compared to most recent 09/17/2023 radiographs, but appearing more similar to 08/08/2023 radiograph. No definite pleural effusion. No pneumothorax. Mild dextrocurvature of the midthoracic spine is increased from prior, and likely accentuated by patient positioning. IMPRESSION: New moderate interstitial and alveolar pulmonary edema compared to 09/17/2023, more similar to 08/08/2023. Electronically Signed   By: Neita Garnet M.D.   On: 11/01/2023 20:05      Subjective: Patient seen and examined at bedside.  Feels much better and feels okay to go home today.  Breathing is improving.  No chest pain, fever or vomiting reported.  Discharge Exam: Vitals:   11/02/23 0700 11/02/23 0934  BP: (!) 156/78 (!) 147/67  Pulse: 90 94  Resp:  20  Temp:  99.8 F (37.7 C)  SpO2: 96% 97%    General: Pt is alert, awake, not in acute distress.  Slow to respond.  Poor historian. Cardiovascular: rate controlled, S1/S2 + Respiratory: bilateral decreased breath sounds at bases with scattered crackles Abdominal: Soft, NT, ND, bowel sounds + Extremities: Trace lower extremity edema; no cyanosis    The results of significant diagnostics from this hospitalization (including imaging, microbiology,  ancillary and laboratory) are listed below for reference.     Microbiology: Recent Results (from the past 240 hours)  Resp panel by RT-PCR (RSV, Flu A&B, Covid) Anterior Nasal Swab     Status: None   Collection Time: 11/01/23  7:15 PM   Specimen: Anterior Nasal Swab  Result Value Ref Range Status   SARS Coronavirus 2 by RT PCR NEGATIVE NEGATIVE Final   Influenza A by PCR NEGATIVE NEGATIVE Final   Influenza B by PCR NEGATIVE NEGATIVE Final    Comment: (NOTE) The Xpert Xpress SARS-CoV-2/FLU/RSV plus assay is intended as an aid in the diagnosis of influenza from Nasopharyngeal swab specimens and should not be used as a sole basis for treatment. Nasal washings and aspirates are unacceptable for Xpert Xpress SARS-CoV-2/FLU/RSV testing.  Fact Sheet for Patients: BloggerCourse.com  Fact Sheet for Healthcare Providers: SeriousBroker.it  This test is not yet approved or cleared by the [place] FDA and has been authorized for detection and/or diagnosis of SARS-CoV-2 by FDA under an Emergency Use Authorization (EUA). This EUA will remain in effect (meaning this test can be used) for the duration of the COVID-19 declaration under Section 564(b)(1) of the Act, 21 U.S.C. section 360bbb-3(b)(1), unless the authorization is terminated or revoked.     Resp Syncytial Virus by PCR NEGATIVE NEGATIVE Final    Comment: (NOTE) Fact Sheet for Patients: BloggerCourse.com  Fact Sheet for Healthcare Providers: SeriousBroker.it  This test is not yet  approved or cleared by the [place] and has been authorized for detection and/or diagnosis of SARS-CoV-2 by FDA under an Emergency Use Authorization (EUA). This EUA will remain in effect (meaning this test can be used) for the duration of the COVID-19 declaration under Section 564(b)(1) of the Act, 21 U.S.C. section 360bbb-3(b)(1), unless the  authorization is terminated or revoked.  Performed at [facility], [address], [place], [place] [address]      Labs: BNP (last 3 results) Recent Labs    07/13/23 0406 09/17/23 1910 11/01/23 1915  BNP 919.2* 1,284.2* 2,761.9*   Basic Metabolic Panel: Recent Labs  Lab 11/01/23 1915 11/01/23 2043 11/01/23 2112 11/02/23 0700  NA 141 139  --  136  K 6.7* 6.6*  --  3.8  CL 100 103  --  92*  CO2 24  --   --  30  GLUCOSE 98 98  --  153*  BUN 49* 45*  --  22*  CREATININE 7.73* 8.20*  --  4.48*  CALCIUM 8.3*  --   --  9.1  MG  --   --   --  1.8  PHOS  --   --  6.8* 4.3   Liver Function Tests: Recent Labs  Lab 11/01/23 1915 11/02/23 0700  AST 22 20  ALT 15 15  ALKPHOS 64 64  BILITOT 0.8 1.2  PROT 7.5 7.8  ALBUMIN 3.4* 3.4*   No results for input(s): "LIPASE", "AMYLASE" in the last 168 hours. No results for input(s): "AMMONIA" in the last 168 hours. CBC: Recent Labs  Lab 11/01/23 1915 11/01/23 2043 11/02/23 0700  WBC 10.9*  --  10.5  NEUTROABS 7.9*  --  7.3  HGB 9.5* 11.2* 9.7*  HCT 30.0* 33.0* 29.7*  MCV 80.0  --  77.7*  PLT 233  --  235   Cardiac Enzymes: No results for input(s): "CKTOTAL", "CKMB", "CKMBINDEX", "TROPONINI" in the last 168 hours. BNP: Invalid input(s): "POCBNP" CBG: Recent Labs  Lab 11/01/23 2255 11/01/23 2318 11/02/23 0741  GLUCAP 32* 143* 115*   D-Dimer No results for input(s): "DDIMER" in the last 72 hours. Hgb A1c No results for input(s): "HGBA1C" in the last 72 hours. Lipid Profile No results for input(s): "CHOL", "HDL", "LDLCALC", "TRIG", "CHOLHDL", "LDLDIRECT" in the last 72 hours. Thyroid function studies No results for input(s): "TSH", "T4TOTAL", "T3FREE", "THYROIDAB" in the last 72 hours.  Invalid input(s): "FREET3" Anemia work up No results for input(s): "VITAMINB12", "FOLATE", "FERRITIN", "TIBC", "IRON", "RETICCTPCT" in the last 72 hours. Urinalysis    Component Value Date/Time   COLORURINE AMBER (A)  11/14/2022 1902   APPEARANCEUR CLOUDY (A) 11/14/2022 1902   LABSPEC 1.018 11/14/2022 1902   PHURINE 7.0 11/14/2022 1902   GLUCOSEU NEGATIVE 11/14/2022 1902   HGBUR NEGATIVE 11/14/2022 1902   BILIRUBINUR NEGATIVE 11/14/2022 1902   KETONESUR 5 (A) 11/14/2022 1902   PROTEINUR >=300 (A) 11/14/2022 1902   NITRITE NEGATIVE 11/14/2022 1902   LEUKOCYTESUR MODERATE (A) 11/14/2022 1902   Sepsis Labs Recent Labs  Lab 11/01/23 1915 11/02/23 0700  WBC 10.9* 10.5   Microbiology Recent Results (from the past 240 hours)  Resp panel by RT-PCR (RSV, Flu A&B, Covid) Anterior Nasal Swab     Status: None   Collection Time: 11/01/23  7:15 PM   Specimen: Anterior Nasal Swab  Result Value Ref Range Status   SARS Coronavirus 2 by RT PCR NEGATIVE NEGATIVE Final   Influenza A by PCR NEGATIVE NEGATIVE Final  Influenza B by PCR NEGATIVE NEGATIVE Final    Comment: (NOTE) The Xpert Xpress SARS-CoV-2/FLU/RSV plus assay is intended as an aid in the diagnosis of influenza from Nasopharyngeal swab specimens and should not be used as a sole basis for treatment. Nasal washings and aspirates are unacceptable for Xpert Xpress SARS-CoV-2/FLU/RSV testing.  Fact Sheet for Patients: BloggerCourse.com  Fact Sheet for Healthcare Providers: SeriousBroker.it  This test is not yet approved or cleared by the [place] FDA and has been authorized for detection and/or diagnosis of SARS-CoV-2 by FDA under an Emergency Use Authorization (EUA). This EUA will remain in effect (meaning this test can be used) for the duration of the COVID-19 declaration under Section 564(b)(1) of the Act, 21 U.S.C. section 360bbb-3(b)(1), unless the authorization is terminated or revoked.     Resp Syncytial Virus by PCR NEGATIVE NEGATIVE Final    Comment: (NOTE) Fact Sheet for Patients: BloggerCourse.com  Fact Sheet for Healthcare  Providers: SeriousBroker.it  This test is not yet approved or cleared by the [place] FDA and has been authorized for detection and/or diagnosis of SARS-CoV-2 by FDA under an Emergency Use Authorization (EUA). This EUA will remain in effect (meaning this test can be used) for the duration of the COVID-19 declaration under Section 564(b)(1) of the Act, 21 U.S.C. section 360bbb-3(b)(1), unless the authorization is terminated or revoked.  Performed at [facility], [address] [place], [place] [address]      Time coordinating discharge: 35 minutes  SIGNED:   Glade Lloyd, MD  [facility] 11/02/2023, 10:51 AM

## 2023-11-02 NOTE — Procedures (Deleted)
 [facility] Progress Note  Subjective:  Pt seen in room  3.5 L off w/ HD last night SOB better  Vitals:   11/02/23 0400 11/02/23 0623 11/02/23 0624 11/02/23 0700  BP: 134/78 (!) 145/96  (!) 156/78  Pulse:  94 93 90  Resp: 20 18    Temp: 99 F (37.2 C) 99 F (37.2 C)    TempSrc: Oral Oral    SpO2: 100% 98% 97% 96%  Weight:      Height:        Exam: Gen alert, no distress, coughing off and on Looks like she is holding her breath No rash, cyanosis or gangrene Sclera anicteric, throat clear  No jvd or bruits Chest fine bibasilar crackles, no bronchial BS RRR no MRG Abd soft ntnd no mass or ascites +bs GU defer MS no joint effusions or deformity Ext mild 1+ pretib LE edema, no other edema Neuro is alert, Ox 3 , nf    LUA AVF+bruit/  RIJ TDC       Renal-related home meds: - norvasc 10 every day - coreg 6.25 bid - renvela 800 ac tid - others: prednisone taper, eliquis      OP HD: [facility] , need info    Assessment/ Plan: Acute hypoxic resp failure - w/ high BP's and sig SOB. CXR shows interstitial and alveolar edema. Had HD last night w/ 3.5 L off.  Was on 4 L -> 2 L -> room air this morning. Pt looks better. Will d/w pmd.  ESRD - on HD MWF in [facility].  HTN - poorly controlled. Get vol down w/ HD. Cont home meds.  Volume - as above Anemia of esrd - Hb 11, no esa needs.  Secondary hyperparathyroidism - add on phos and calcium.  H/o CVA DM2 Atrial fib         Vinson Moselle MD  [facility] 11/02/2023, 8:56 AM  Recent Labs  Lab 11/01/23 1915 11/01/23 2043 11/01/23 2112 11/02/23 0700  HGB 9.5* 11.2*  --  9.7*  ALBUMIN 3.4*  --   --  3.4*  CALCIUM 8.3*  --   --  9.1  PHOS  --   --  6.8* 4.3  CREATININE 7.73* 8.20*  --  4.48*  K 6.7* 6.6*  --  3.8   No results for input(s): "IRON", "TIBC", "FERRITIN" in the last 168 hours. Inpatient medications:  amLODipine  10 mg Oral Daily   apixaban  2.5 mg Oral BID   Chlorhexidine Gluconate  Cloth  6 each Topical Q0600   insulin aspart  0-6 Units Subcutaneous TID WC   sevelamer carbonate  800 mg Oral TID WC    acetaminophen **OR** acetaminophen, dextrose, melatonin, ondansetron (ZOFRAN) IV

## 2023-11-04 LAB — HEPATITIS B SURFACE ANTIBODY, QUANTITATIVE: Hep B S AB Quant (Post): 59.8 m[IU]/mL

## 2023-11-11 ENCOUNTER — Telehealth: Payer: Self-pay

## 2023-11-11 NOTE — Telephone Encounter (Signed)
 Carly, you know anything about this?

## 2023-11-11 NOTE — Telephone Encounter (Signed)
 Patient was called and VM was left informing patient to keep her appointment for tomorrow to discuss PT referral.    Copied from CRM [id number]. Topic: Referral - Question >> Nov 11, 2023  3:45 PM Ja-Kwan M wrote: Reason for CRM: Patient would like to start back her physical therapy and would to discuss the location. Call back# [phone] alternate# [phone]

## 2023-11-11 NOTE — Telephone Encounter (Signed)
 Copied from CRM [id number]. Topic: Clinical - Medication Question >> Nov 11, 2023  3:27 PM Ja-Kwan M wrote: Reason for CRM: Patient stated that she was told to contact her doctor to get prescriptions for adult pull ups (medium), shower chair, bed pads (large), body wipes (large) and a walker to be sent to [facility] [address] [place], [place] [address].

## 2023-11-11 NOTE — Telephone Encounter (Signed)
 I have not seen any orders for this.

## 2023-11-12 ENCOUNTER — Ambulatory Visit: Attending: Family Medicine | Admitting: Family Medicine

## 2023-11-12 ENCOUNTER — Telehealth: Payer: Self-pay | Admitting: Licensed Clinical Social Worker

## 2023-11-12 ENCOUNTER — Other Ambulatory Visit: Payer: Self-pay

## 2023-11-12 ENCOUNTER — Encounter: Payer: Self-pay | Admitting: Family Medicine

## 2023-11-12 VITALS — BP 173/79 | HR 86 | Ht 65.0 in | Wt 141.6 lb

## 2023-11-12 DIAGNOSIS — I4891 Unspecified atrial fibrillation: Secondary | ICD-10-CM

## 2023-11-12 DIAGNOSIS — Z8673 Personal history of transient ischemic attack (TIA), and cerebral infarction without residual deficits: Secondary | ICD-10-CM

## 2023-11-12 DIAGNOSIS — I10 Essential (primary) hypertension: Secondary | ICD-10-CM | POA: Diagnosis not present

## 2023-11-12 DIAGNOSIS — F32A Depression, unspecified: Secondary | ICD-10-CM

## 2023-11-12 DIAGNOSIS — F17210 Nicotine dependence, cigarettes, uncomplicated: Secondary | ICD-10-CM

## 2023-11-12 DIAGNOSIS — Z1211 Encounter for screening for malignant neoplasm of colon: Secondary | ICD-10-CM

## 2023-11-12 DIAGNOSIS — F17209 Nicotine dependence, unspecified, with unspecified nicotine-induced disorders: Secondary | ICD-10-CM

## 2023-11-12 DIAGNOSIS — I480 Paroxysmal atrial fibrillation: Secondary | ICD-10-CM

## 2023-11-12 DIAGNOSIS — E1169 Type 2 diabetes mellitus with other specified complication: Secondary | ICD-10-CM

## 2023-11-12 DIAGNOSIS — E1122 Type 2 diabetes mellitus with diabetic chronic kidney disease: Secondary | ICD-10-CM

## 2023-11-12 DIAGNOSIS — Z634 Disappearance and death of family member: Secondary | ICD-10-CM

## 2023-11-12 DIAGNOSIS — G8929 Other chronic pain: Secondary | ICD-10-CM | POA: Diagnosis not present

## 2023-11-12 DIAGNOSIS — E119 Type 2 diabetes mellitus without complications: Secondary | ICD-10-CM | POA: Diagnosis not present

## 2023-11-12 DIAGNOSIS — I69998 Other sequelae following unspecified cerebrovascular disease: Secondary | ICD-10-CM

## 2023-11-12 LAB — POCT GLYCOSYLATED HEMOGLOBIN (HGB A1C): HbA1c, POC (controlled diabetic range): 6.5 % (ref 0.0–7.0)

## 2023-11-12 MED ORDER — CARVEDILOL 6.25 MG PO TABS
6.2500 mg | ORAL_TABLET | Freq: Two times a day (BID) | ORAL | 1 refills | Status: DC
  Filled 2023-11-12 – 2023-12-02 (×3): qty 60, 30d supply, fill #0
  Filled 2024-02-10 – 2024-02-11 (×2): qty 180, 90d supply, fill #0

## 2023-11-12 MED ORDER — BUPROPION HCL ER (XL) 150 MG PO TB24
150.0000 mg | ORAL_TABLET | Freq: Every day | ORAL | 1 refills | Status: DC
  Filled 2023-11-12 – 2023-12-02 (×3): qty 30, 30d supply, fill #0
  Filled 2024-02-11: qty 90, 90d supply, fill #0

## 2023-11-12 MED ORDER — AMLODIPINE BESYLATE 10 MG PO TABS
10.0000 mg | ORAL_TABLET | Freq: Every day | ORAL | 1 refills | Status: DC
  Filled 2023-11-12 – 2023-12-02 (×3): qty 30, 30d supply, fill #0
  Filled 2024-02-10 – 2024-02-25 (×3): qty 90, 90d supply, fill #0

## 2023-11-12 MED ORDER — APIXABAN 2.5 MG PO TABS
2.5000 mg | ORAL_TABLET | Freq: Two times a day (BID) | ORAL | 1 refills | Status: AC
  Filled 2023-11-12 – 2023-12-02 (×3): qty 60, 30d supply, fill #0
  Filled 2024-02-10 – 2024-02-11 (×2): qty 180, 90d supply, fill #0

## 2023-11-12 MED ORDER — HYDROXYZINE HCL 25 MG PO TABS
25.0000 mg | ORAL_TABLET | Freq: Every evening | ORAL | 1 refills | Status: DC | PRN
  Filled 2023-11-12 – 2023-12-02 (×3): qty 30, 30d supply, fill #0
  Filled 2024-02-11: qty 90, 90d supply, fill #0

## 2023-11-12 MED ORDER — DULOXETINE HCL 30 MG PO CPEP
30.0000 mg | ORAL_CAPSULE | Freq: Every day | ORAL | 1 refills | Status: DC
  Filled 2023-11-12 – 2023-12-02 (×3): qty 30, 30d supply, fill #0
  Filled 2024-02-10 – 2024-02-11 (×2): qty 90, 90d supply, fill #0

## 2023-11-12 NOTE — Telephone Encounter (Signed)
 Met with patient today during the warm handoff.  Patient is a patient of Dr. Alvis Lemmings. She is currently homeless and on dialysis. Unfortunately she recently lost her mother two weeks ago. She is interested in grief counseling via phone. Can you please assist?

## 2023-11-12 NOTE — Progress Notes (Signed)
 Subjective:  Patient ID: Stacey Lucas, female    DOB: 1976-04-05  Age: 48 y.o. MRN: [id number]  CC: Medical Management of Chronic Issues (Depression/Medication refills)     Discussed the use of AI scribe software for clinical note transcription with the patient, who gave verbal consent to proceed.  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 The patient's depression has been exacerbated by the recent death of her mother, leading to sleep disturbances. She expresses a need for medication to manage her depression and help with sleep. She also reports that she has no family support.  The patient is also in need of refills for her hypertension and atrial fibrillation medications. She reports that she has run out of her amlodipine and carvedilol for hypertension, and Eliquis for atrial fibrillation. She also mentions that she has not had any issues with her diabetes since starting dialysis and does not take medication for it.  The patient is a smoker and expresses a desire to quit. She has not had an eye exam recently, which is recommended annually due to her diabetes. She also reports right-sided pain.    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 12/29/2021   Anxiety and depression    Atrial fibrillation (HCC)    Cerebral thrombosis with cerebral infarction 05/24/2021   Cocaine abuse (HCC)    Depression with suicidal ideation    [facility] admission 04/2018   Diabetes mellitus without complication (HCC)    Type II   End stage kidney disease (HCC)    GERD (gastroesophageal reflux disease)    Hyperlipidemia    Hypertension     Impingement syndrome of right shoulder    Migraine headache    Osteoarthritis of right knee 05/24/2021   Pilonidal abscess 05/2013   Pyelonephritis    Right thyroid nodule    Sciatica     Past Surgical History:  Procedure Laterality Date   AV FISTULA PLACEMENT Left 03/17/2023   Procedure: LEFT ARM BRACHIOCEPHALIC ARTERIOVENOUS (AV) FISTULA CREATION;  Surgeon: Victorino Sparrow, MD;  Location: [facility] OR;  Service: Vascular;  Laterality: Left;   BUBBLE STUDY  01/01/2022   Procedure: BUBBLE STUDY;  Surgeon: Maisie Fus, MD;  Location: [facility] ENDOSCOPY;  Service: Cardiovascular;;   IR FLUORO GUIDE CV LINE RIGHT  03/14/2023   IR US GUIDE VASC ACCESS RIGHT  03/14/2023   TEE WITHOUT CARDIOVERSION N/A 01/01/2022   Procedure: TRANSESOPHAGEAL ECHOCARDIOGRAM (TEE);  Surgeon: Maisie Fus, MD;  Location: [facility] ENDOSCOPY;  Service: Cardiovascular;  Laterality: N/A;   tubal     TUBAL LIGATION      Family History  Problem Relation Age of Onset   Diabetes Mother    Hypertension Mother    Arthritis Mother    Diabetes Father    Hypertension Father     Social History   Socioeconomic History   Marital status: Single    Spouse name: Not on file   Number of children: 3   Years of education: Not on file   Highest education level: Not on file  Occupational History   Not on file  Tobacco Use   Smoking status: Every Day    Current packs/day: 0.00  Types: Cigarettes    Last attempt to quit: 01/16/2022    Years since quitting: 1.8    Passive exposure: Current   Smokeless tobacco: Never  Vaping Use   Vaping status: Never Used  Substance and Sexual Activity   Alcohol use: Yes    Comment: Socially   Drug use: No    Comment: Denies despite h/o cocaine use   Sexual activity: Yes    Birth control/protection: None  Other Topics Concern   Not on file  Social History Narrative   Patient reports she lives with her boyfriend. She currently is disabled and doesn't work.    Social Drivers of Manufacturing engineer Strain: High Risk (10/29/2023)   Received from [facility]   Overall Financial Resource Strain (CARDIA)    Difficulty of Paying Living Expenses: Very hard  Food Insecurity: Food Insecurity Present (10/29/2023)   Received from [facility]   Hunger Vital Sign    Worried About Running Out of Food in the Last Year: Often true    Ran Out of Food in the Last Year: Often true  Transportation Needs: Unmet Transportation Needs (10/29/2023)   Received from [facility] - Transportation    Lack of Transportation (Medical): Yes    Lack of Transportation (Non-Medical): Yes  Physical Activity: Not on file  Stress: Stress Concern Present (03/20/2023)   [employer] of Occupational [employer] - Occupational Stress Questionnaire    Feeling of Stress : Rather much  Social Connections: Unknown (01/12/2022)   [facility]   Social Network    Social Network: Not on file    Allergies  Allergen Reactions   Neurontin [Gabapentin] Other (See Comments)    Weakness  Balance impairment     Outpatient Medications Prior to Visit  Medication Sig Dispense Refill   nicotine polacrilex (NICORETTE) 4 MG gum Take by mouth.     sevelamer carbonate (RENVELA) 800 MG tablet Take 800 mg by mouth 3 (three) times daily with meals.     amLODipine (NORVASC) 10 MG tablet Take 1 tablet (10 mg total) by mouth daily. 30 tablet 0   apixaban (ELIQUIS) 2.5 MG TABS tablet Take 1 tablet (2.5 mg total) by mouth 2 (two) times daily. 60 tablet 1   carvedilol (COREG) 6.25 MG tablet Take 1 tablet (6.25 mg total) by mouth 2 (two) times daily with a meal.     No facility-administered medications prior to visit.     ROS Review of Systems  Constitutional:  Negative for activity change and appetite change.  HENT:  Negative for sinus pressure and sore throat.   Respiratory:  Negative for chest tightness, shortness of breath and wheezing.   Cardiovascular:  Negative  for chest pain and palpitations.  Gastrointestinal:  Negative for abdominal distention, abdominal pain and constipation.  Genitourinary: Negative.   Musculoskeletal:        See HPI  Neurological:  Positive for weakness.  Psychiatric/Behavioral:  Positive for dysphoric mood. Negative for behavioral problems.     Objective:  BP (!) 173/79   Pulse 86   Ht 5\' 5"  (1.651 m)   Wt 141 lb 9.6 oz (64.2 kg)   LMP 10/11/2019   SpO2 99%   BMI 23.56 kg/m      11/12/2023   10:54 AM 11/12/2023   10:08 AM 11/02/2023   11:02 AM  BP/Weight  Systolic BP 173 183 143  Diastolic BP 79 81 77  Wt. (Lbs)  141.6   BMI  23.56 kg/m2       Physical Exam Constitutional:      Appearance: She is well-developed.  Cardiovascular:     Rate and Rhythm: Normal rate.     Heart sounds: Normal heart sounds. No murmur heard. Pulmonary:     Effort: Pulmonary effort is normal.     Breath sounds: Normal breath sounds. No wheezing or rales.  Chest:     Chest wall: No tenderness.  Abdominal:     General: Bowel sounds are normal. There is no distension.     Palpations: Abdomen is soft. There is no mass.     Tenderness: There is no abdominal tenderness.  Musculoskeletal:        General: Normal range of motion.     Right lower leg: No edema.     Left lower leg: No edema.  Neurological:     Mental Status: She is alert and oriented to person, place, and time.     Motor: Weakness present.  Psychiatric:     Comments: Dysphoric mood        Latest Ref Rng & Units 11/02/2023    7:00 AM 11/01/2023    8:43 PM 11/01/2023    7:15 PM  CMP  Glucose 70 - 99 mg/dL 161  98  98   BUN 6 - 20 mg/dL 22  45  49   Creatinine 0.44 - 1.00 mg/dL 0.96  0.45  4.09   Sodium 135 - 145 mmol/L 136  139  141   Potassium 3.5 - 5.1 mmol/L 3.8  6.6  6.7   Chloride 98 - 111 mmol/L 92  103  100   CO2 22 - 32 mmol/L 30   24   Calcium 8.9 - 10.3 mg/dL 9.1   8.3   Total Protein 6.5 - 8.1 g/dL 7.8   7.5   Total Bilirubin 0.0 - 1.2 mg/dL  1.2   0.8   Alkaline Phos 38 - 126 U/L 64   64   AST 15 - 41 U/L 20   22   ALT 0 - 44 U/L 15   15     Lipid Panel     Component Value Date/Time   CHOL 190 11/15/2022 0618   CHOL 218 (H) 03/05/2022 1045   TRIG 205 (H) 11/15/2022 0618   HDL 50 11/15/2022 0618   HDL 76 03/05/2022 1045   CHOLHDL 3.8 11/15/2022 0618   VLDL 41 (H) 11/15/2022 0618   LDLCALC 99 11/15/2022 0618   LDLCALC 120 (H) 03/05/2022 1045    CBC    Component Value Date/Time   WBC 10.5 11/02/2023 0700   RBC 3.82 (L) 11/02/2023 0700   HGB 9.7 (L) 11/02/2023 0700   HGB 10.9 (L) 03/05/2022 1045   HCT 29.7 (L) 11/02/2023 0700   HCT 32.6 (L) 03/05/2022 1045   PLT 235 11/02/2023 0700   PLT 287 03/05/2022 1045   MCV 77.7 (L) 11/02/2023 0700   MCV 77 (L) 03/05/2022 1045   MCH 25.4 (L) 11/02/2023 0700   MCHC 32.7 11/02/2023 0700   RDW 16.7 (H) 11/02/2023 0700   RDW 14.0 03/05/2022 1045   LYMPHSABS 2.4 11/02/2023 0700   LYMPHSABS 3.0 03/05/2022 1045   MONOABS 0.5 11/02/2023 0700   EOSABS 0.2 11/02/2023 0700   EOSABS 0.3 03/05/2022 1045   BASOSABS 0.1 11/02/2023 0700   BASOSABS 0.0 03/05/2022 1045    Lab Results  Component Value Date   HGBA1C 6.5  11/12/2023       Assessment & Plan Depression Recent bereavement with symptoms of insomnia and low mood. Discussed the benefits of grief counseling and pharmacotherapy. -Start Wellbutrin for depression and smoking cessation. -Start Hydroxyzine at bedtime for anxiety and insomnia. -Coordinate with Child psychotherapist for grief counseling resources.  Chronic Pain Right-sided pain secondary to previous strokes. No current pain management. -Start Cymbalta for chronic pain management.  Hypertension Blood pressure elevated today, patient reports running out of medication. -Refill Amlodipine and Carvedilol. -Schedule follow-up in 6 weeks to reassess blood pressure control.  Atrial Fibrillation History of atrial fibrillation and stroke, patient ran out of  anticoagulation medication. -Refill Eliquis.  Type 2 Diabetes Mellitus Patient reports no issues with blood sugar since starting dialysis. -A1C6.5 and she is on diet control. -Continue to monitor blood glucose levels and A1C.  Smoking Cessation Patient expresses desire to quit smoking. -Start Wellbutrin for smoking cessation.  General Health Maintenance -Order colon cancer screening test (last completed in 2023). -Refer to ophthalmology for annual diabetic eye exam.      Meds ordered this encounter  Medications   hydrOXYzine (ATARAX) 25 MG tablet    Sig: Take 1 tablet (25 mg total) by mouth at bedtime as needed.    Dispense:  90 tablet    Refill:  1   amLODipine (NORVASC) 10 MG tablet    Sig: Take 1 tablet (10 mg total) by mouth daily.    Dispense:  90 tablet    Refill:  1   apixaban (ELIQUIS) 2.5 MG TABS tablet    Sig: Take 1 tablet (2.5 mg total) by mouth 2 (two) times daily.    Dispense:  180 tablet    Refill:  1   carvedilol (COREG) 6.25 MG tablet    Sig: Take 1 tablet (6.25 mg total) by mouth 2 (two) times daily with a meal.    Dispense:  180 tablet    Refill:  1   buPROPion (WELLBUTRIN XL) 150 MG 24 hr tablet    Sig: Take 1 tablet (150 mg total) by mouth daily.    Dispense:  90 tablet    Refill:  1   DULoxetine (CYMBALTA) 30 MG capsule    Sig: Take 1 capsule (30 mg total) by mouth daily. For Chronic pain    Dispense:  90 capsule    Refill:  1    Follow-up: Return in about 6 weeks (around 12/24/2023) for Blood Pressure follow-up with PCP.       Hoy Register, MD, FAAFP. [facility] [place], [place] [phone]   11/12/2023, 10:59 AM

## 2023-11-12 NOTE — Patient Instructions (Signed)
 VISIT SUMMARY:  During today's visit, we addressed your concerns about depression, medication refills, and other health issues. We discussed your recent bereavement and its impact on your mood and sleep. We also reviewed your current medications and made necessary adjustments. Additionally, we talked about your desire to quit smoking and the importance of regular health screenings.  YOUR PLAN:  -DEPRESSION: Depression is a mental health condition characterized by persistent feelings of sadness and loss of interest. We will start you on Wellbutrin to help manage your depression and assist with smoking cessation. Additionally, Hydroxyzine will be prescribed to help with anxiety and sleep disturbances. We will also coordinate with a social worker to provide grief counseling resources.  -CHRONIC PAIN: Chronic pain is long-lasting pain that persists beyond the usual recovery period. Your right-sided pain is likely due to your previous strokes. We will start you on Cymbalta to help manage this pain.  -HYPERTENSION: Hypertension, or high blood pressure, is a condition where the force of the blood against your artery walls is too high. We will refill your Amlodipine and Carvedilol prescriptions to help control your blood pressure. Please schedule a follow-up appointment in 6 weeks to reassess your blood pressure control.  -ATRIAL FIBRILLATION: Atrial fibrillation is an irregular and often rapid heart rate that can increase the risk of strokes. We will refill your Eliquis prescription to help prevent blood clots.  -TYPE 2 DIABETES MELLITUS: Type 2 diabetes is a chronic condition that affects the way your body processes blood sugar. Since you have not had any issues with your blood sugar levels since starting dialysis, we will continue to monitor your blood glucose levels and A1C which was 6.5 today  -SMOKING CESSATION: Smoking cessation is the process of discontinuing tobacco smoking. We will start you on  Wellbutrin to help you quit smoking.  -GENERAL HEALTH MAINTENANCE: It is important to maintain regular health screenings and check-ups. We will order a colon cancer screening test and refer you to ophthalmology for your annual diabetic eye exam.  INSTRUCTIONS:  Please schedule a follow-up appointment in 6 weeks to reassess your blood pressure control. Additionally, coordinate with the social worker for grief counseling resources and schedule your annual diabetic eye exam with ophthalmology.  For more information, you can read your full clinical note, available in your patient portal.

## 2023-11-13 ENCOUNTER — Other Ambulatory Visit (HOSPITAL_COMMUNITY): Payer: Self-pay

## 2023-11-13 ENCOUNTER — Other Ambulatory Visit: Payer: Self-pay

## 2023-11-13 NOTE — Telephone Encounter (Signed)
 Patient seen in office on 11/12/2023, Patient requesting prescriptions for adult pull ups (medium), shower chair, bed pads (large), body wipes (large) and a walker to be sent to [facility] [address] [place], [place] [address]. Please advise.

## 2023-11-13 NOTE — Telephone Encounter (Addendum)
 Patient states she would like a follow up call today regarding the orders mentioned below. Please call back # [phone]

## 2023-11-13 NOTE — Telephone Encounter (Signed)
 Can note be changed to state that patient has urinary incontinence

## 2023-11-14 MED ORDER — MISC. DEVICES MISC: 0 refills | Status: DC

## 2023-11-14 MED ORDER — MISC. DEVICES MISC: 12 refills | Status: DC

## 2023-11-14 NOTE — Telephone Encounter (Signed)
 Attempted to contact patient and VM was left informing patient to return call.

## 2023-11-14 NOTE — Addendum Note (Signed)
 Addended by: Hoy Register on: 11/14/2023 11:24 AM   Modules accepted: Orders

## 2023-11-14 NOTE — Telephone Encounter (Signed)
 I have written the prescriptions for her.  She is on hemodialysis and I am not sure she makes urine hence I am unable to make a diagnosis of urinary incontinence.  I tried calling her but could not reach her.  Can you please find out from her why she needs the incontinence supplies or if she makes urine and is incontinent so I can addend the note to include this?  Thank you.

## 2023-11-20 ENCOUNTER — Encounter: Payer: Self-pay | Admitting: Family Medicine

## 2023-11-20 LAB — COLOGUARD

## 2023-11-21 ENCOUNTER — Other Ambulatory Visit: Payer: Self-pay

## 2023-12-01 ENCOUNTER — Other Ambulatory Visit (HOSPITAL_BASED_OUTPATIENT_CLINIC_OR_DEPARTMENT_OTHER): Payer: Self-pay

## 2023-12-01 MED ORDER — ATORVASTATIN CALCIUM 40 MG PO TABS
40.0000 mg | ORAL_TABLET | Freq: Every day | ORAL | 0 refills | Status: DC
  Filled 2023-12-02 – 2024-02-11 (×4): qty 30, 30d supply, fill #0

## 2023-12-02 ENCOUNTER — Other Ambulatory Visit (HOSPITAL_COMMUNITY): Payer: Self-pay

## 2023-12-02 ENCOUNTER — Other Ambulatory Visit: Payer: Self-pay

## 2023-12-04 ENCOUNTER — Other Ambulatory Visit (HOSPITAL_COMMUNITY): Payer: Self-pay

## 2023-12-08 ENCOUNTER — Encounter (HOSPITAL_COMMUNITY): Payer: Self-pay

## 2023-12-08 ENCOUNTER — Other Ambulatory Visit (HOSPITAL_COMMUNITY): Payer: Self-pay

## 2023-12-22 ENCOUNTER — Encounter: Admitting: Family Medicine

## 2023-12-25 ENCOUNTER — Ambulatory Visit: Admitting: Family Medicine

## 2024-01-12 ENCOUNTER — Ambulatory Visit: Admitting: Family Medicine

## 2024-01-14 ENCOUNTER — Telehealth: Payer: Self-pay

## 2024-01-14 NOTE — Telephone Encounter (Signed)
 Fax received from [facility] : [phone]  stating the patient needs an new PCS request sent to [facility] because her insurance has changed.  She is no longer enrolled with [employer].  I called the patient to confirm her address. The phone number and address on the document from [place] are different from the information in [place].  I called the patient and updated Epic.  PCS request to Dr Newlin for signature

## 2024-01-19 NOTE — Telephone Encounter (Signed)
 Signed PCS request efaxed to [place] [facility]

## 2024-01-22 ENCOUNTER — Telehealth: Payer: Self-pay | Admitting: Critical Care Medicine

## 2024-01-22 NOTE — Telephone Encounter (Signed)
 Dr. Brent Cambric patient, patient last saw Dr. Newlin 11/12/2023.   Copied from CRM [id number]. Topic: Referral - Prior Authorization Question >> Jan 22, 2024  9:06 AM Stacey Lucas wrote:  Reason for CRM: [facility] Stacey Lucas 3051 form was sent to us  and it has to be signed by the doctor for state to auth svcs. She is requesting that a copy be faxed to them looked in chart is not there. She is going to bring the form in office.

## 2024-01-22 NOTE — Telephone Encounter (Signed)
 FYI

## 2024-01-23 NOTE — Telephone Encounter (Signed)
 Copied from CRM [id number]. Topic: General - Other >> Jan 23, 2024 10:09 AM Everette C wrote:  Reason for CRM: Anabel Balloon with [facility] has called to request that the patient's recently completed 3051 form be submitted to their office for additional paperwork compilation at [phone] Attn Bridgette Campus or [place]

## 2024-01-27 NOTE — Telephone Encounter (Signed)
 Call returned to [facility] : [phone], I had to leave a message for Bridgette Campus or [place] stating that I have already submitted the 3051 to [place] [facility].

## 2024-01-27 NOTE — Telephone Encounter (Signed)
 [facility] called, requesting the [phone] form to be faxed to this number  [phone] ATTN; Buddie Carina. Thank you.

## 2024-01-29 NOTE — Telephone Encounter (Signed)
 Copied from CRM [id number]. Topic: Medical Record Request - Other >> Jan 27, 2024  9:45 AM Rosaria Common wrote:  Reason for CRM: Bridgette Campus from [facility] calling to verify if DHB form has been received and completed , and is ready for pickup. Phone lines disconnected with no callback number given.  >> Jan 29, 2024  3:28 PM Zipporah Him wrote: Ms Quince Bryant from [facility] calling in regard to this paperwork that needed to be completed and faxed. It if it was faxed over it was not received, and she is wanting to pick the papers up and just hand deliver them if at all possible. Please call to advise if/when she can pick up the papers. Callback # [phone]

## 2024-01-29 NOTE — Telephone Encounter (Signed)
 I spoke to [facility] and she was requesting the [facility] request.. I told her that it was sent to [facility] and has now been sent to be scanned into the medical record.  She said she needed a copy to add their information to.  I told her that she can call [facility] with the information that she wants to give them.  She said she can't do that and needs to add it in. I said I can call [facility] with her information and she said she didn't have it.

## 2024-02-01 ENCOUNTER — Emergency Department (HOSPITAL_COMMUNITY)

## 2024-02-01 ENCOUNTER — Emergency Department (HOSPITAL_COMMUNITY)
Admission: EM | Admit: 2024-02-01 | Discharge: 2024-02-01 | Disposition: A | Discharge status: Home / Self Care | Attending: Emergency Medicine | Admitting: Emergency Medicine

## 2024-02-01 DIAGNOSIS — D72829 Elevated white blood cell count, unspecified: Secondary | ICD-10-CM | POA: Diagnosis not present

## 2024-02-01 DIAGNOSIS — R112 Nausea with vomiting, unspecified: Secondary | ICD-10-CM | POA: Diagnosis present

## 2024-02-01 DIAGNOSIS — T40601A Poisoning by unspecified narcotics, accidental (unintentional), initial encounter: Secondary | ICD-10-CM

## 2024-02-01 DIAGNOSIS — X58XXXA Exposure to other specified factors, initial encounter: Secondary | ICD-10-CM | POA: Insufficient documentation

## 2024-02-01 DIAGNOSIS — I120 Hypertensive chronic kidney disease with stage 5 chronic kidney disease or end stage renal disease: Secondary | ICD-10-CM | POA: Diagnosis not present

## 2024-02-01 DIAGNOSIS — T402X1A Poisoning by other opioids, accidental (unintentional), initial encounter: Secondary | ICD-10-CM | POA: Insufficient documentation

## 2024-02-01 DIAGNOSIS — Z992 Dependence on renal dialysis: Secondary | ICD-10-CM | POA: Insufficient documentation

## 2024-02-01 DIAGNOSIS — N186 End stage renal disease: Secondary | ICD-10-CM | POA: Insufficient documentation

## 2024-02-01 DIAGNOSIS — Z7901 Long term (current) use of anticoagulants: Secondary | ICD-10-CM | POA: Diagnosis not present

## 2024-02-01 DIAGNOSIS — E1122 Type 2 diabetes mellitus with diabetic chronic kidney disease: Secondary | ICD-10-CM | POA: Diagnosis not present

## 2024-02-01 DIAGNOSIS — Z79899 Other long term (current) drug therapy: Secondary | ICD-10-CM | POA: Diagnosis not present

## 2024-02-01 DIAGNOSIS — F1410 Cocaine abuse, uncomplicated: Secondary | ICD-10-CM | POA: Diagnosis not present

## 2024-02-01 DIAGNOSIS — T68XXXA Hypothermia, initial encounter: Secondary | ICD-10-CM | POA: Insufficient documentation

## 2024-02-01 LAB — CBC WITH DIFFERENTIAL/PLATELET
Abs Immature Granulocytes: 0.04 10*3/uL (ref 0.00–0.07)
Basophils Absolute: 0.1 10*3/uL (ref 0.0–0.1)
Basophils Relative: 1 %
Eosinophils Absolute: 0.7 10*3/uL — ABNORMAL HIGH (ref 0.0–0.5)
Eosinophils Relative: 5 %
HCT: 37.1 % (ref 36.0–46.0)
Hemoglobin: 11.9 g/dL — ABNORMAL LOW (ref 12.0–15.0)
Immature Granulocytes: 0 %
Lymphocytes Relative: 17 %
Lymphs Abs: 2.1 10*3/uL (ref 0.7–4.0)
MCH: 25 pg — ABNORMAL LOW (ref 26.0–34.0)
MCHC: 32.1 g/dL (ref 30.0–36.0)
MCV: 77.9 fL — ABNORMAL LOW (ref 80.0–100.0)
Monocytes Absolute: 0.7 10*3/uL (ref 0.1–1.0)
Monocytes Relative: 5 %
Neutro Abs: 8.7 10*3/uL — ABNORMAL HIGH (ref 1.7–7.7)
Neutrophils Relative %: 72 %
Platelets: 183 10*3/uL (ref 150–400)
RBC: 4.76 MIL/uL (ref 3.87–5.11)
RDW: 17.4 % — ABNORMAL HIGH (ref 11.5–15.5)
Smear Review: NORMAL
WBC: 12.3 10*3/uL — ABNORMAL HIGH (ref 4.0–10.5)
nRBC: 0 % (ref 0.0–0.2)

## 2024-02-01 LAB — CBG MONITORING, ED: Glucose-Capillary: 195 mg/dL — ABNORMAL HIGH (ref 70–99)

## 2024-02-01 LAB — COMPREHENSIVE METABOLIC PANEL WITH GFR
ALT: 9 U/L (ref 0–44)
AST: 18 U/L (ref 15–41)
Albumin: 3.8 g/dL (ref 3.5–5.0)
Alkaline Phosphatase: 104 U/L (ref 38–126)
Anion gap: 13 (ref 5–15)
BUN: 37 mg/dL — ABNORMAL HIGH (ref 6–20)
CO2: 23 mmol/L (ref 22–32)
Calcium: 8.7 mg/dL — ABNORMAL LOW (ref 8.9–10.3)
Chloride: 103 mmol/L (ref 98–111)
Creatinine, Ser: 6.5 mg/dL — ABNORMAL HIGH (ref 0.44–1.00)
GFR, Estimated: 7 mL/min — ABNORMAL LOW (ref >60)
Glucose, Bld: 211 mg/dL — ABNORMAL HIGH (ref 70–99)
Potassium: 5 mmol/L (ref 3.5–5.1)
Sodium: 139 mmol/L (ref 135–145)
Total Bilirubin: 0.4 mg/dL (ref 0.0–1.2)
Total Protein: 7.9 g/dL (ref 6.5–8.1)

## 2024-02-01 LAB — ETHANOL: Alcohol, Ethyl (B): <15 mg/dL (ref <15)

## 2024-02-01 LAB — MAGNESIUM: Magnesium: 2.3 mg/dL (ref 1.7–2.4)

## 2024-02-01 MED ORDER — ONDANSETRON 4 MG PO TBDP: 4.0000 mg | ORAL_TABLET | Freq: Three times a day (TID) | ORAL | 0 refills | Status: DC | PRN

## 2024-02-01 MED ORDER — ONDANSETRON HCL 4 MG/2ML IJ SOLN
4.0000 mg | Freq: Once | INTRAMUSCULAR | Status: AC
  Administered 2024-02-01: 4 mg via INTRAVENOUS
  Filled 2024-02-01: qty 2

## 2024-02-01 NOTE — Discharge Instructions (Addendum)
 Do not use street drugs! You don't know what you are getting, and can easily overdose!

## 2024-02-01 NOTE — ED Provider Notes (Addendum)
 [place] AT [facility] Provider Note   CSN: [id number] Arrival date & time: 02/01/24  0020     History  Chief Complaint  Patient presents with   Drug Overdose    BIBA from home possible OD EMS reports cocaine     Stacey Lucas is a 48 y.o. female.  The history is provided by the EMS personnel and the patient. The history is limited by the condition of the patient (Altered mental status).  Drug Overdose  She has history of hypertension, diabetes, hyperlipidemia, end-stage renal disease on hemodialysis (Monday-Wednesday-Friday), atrial fibrillation anticoagulated on apixaban , stroke, cocaine abuse and was brought in by ambulance after apparent overdose.  She was using cocaine at home, and became unresponsive about an hour after using it.  EMS noted pinpoint pupils and diaphoresis.  She did receive naloxone 0.5 mg following which she woke up.  Patient has no memory of the incidents, but is complaining that she is cold.   Home Medications Prior to Admission medications   Medication Sig Start Date End Date Taking? Authorizing Provider  ondansetron  (ZOFRAN -ODT) 4 MG disintegrating tablet Take 1 tablet (4 mg total) by mouth every 8 (eight) hours as needed for nausea or vomiting. 02/01/24  Yes Alissa April, MD  amLODipine  (NORVASC ) 10 MG tablet Take 1 tablet (10 mg total) by mouth daily. 11/12/23   Newlin, Enobong, MD  apixaban  (ELIQUIS ) 2.5 MG TABS tablet Take 1 tablet (2.5 mg total) by mouth 2 (two) times daily. 11/12/23   Newlin, Enobong, MD  atorvastatin  (LIPITOR ) 40 MG tablet Take 1 tablet (40 mg total) by mouth daily. 07/08/23   Krishnan, Gokul, MD  buPROPion  (WELLBUTRIN  XL) 150 MG 24 hr tablet Take 1 tablet (150 mg total) by mouth daily. 11/12/23   Newlin, Enobong, MD  carvedilol  (COREG ) 6.25 MG tablet Take 1 tablet (6.25 mg total) by mouth 2 (two) times daily with a meal. 11/12/23   Joaquin Mulberry, MD  DULoxetine  (CYMBALTA ) 30 MG capsule Take 1 capsule (30 mg  total) by mouth daily. For Chronic pain 11/12/23   Joaquin Mulberry, MD  hydrOXYzine  (ATARAX ) 25 MG tablet Take 1 tablet (25 mg total) by mouth at bedtime as needed. 11/12/23   Newlin, Enobong, MD  Misc. Devices MISC adult pull ups (medium), bed pads (large), body wipes (large) .  Diagnosis end-stage renal disease on hemodialysis 11/14/23   Joaquin Mulberry, MD  Misc. Devices MISC Shower chair, rolling walker with seat.  Diagnosis end-stage renal disease 11/14/23   Joaquin Mulberry, MD  nicotine  polacrilex (NICORETTE) 4 MG gum Take by mouth. 10/31/23   [provider]  sevelamer  carbonate (RENVELA ) 800 MG tablet Take 800 mg by mouth 3 (three) times daily with meals. 05/15/23 05/14/24  [provider]      Allergies    Neurontin  [gabapentin ]    Review of Systems   Review of Systems  Unable to perform ROS: Mental status change    Physical Exam Updated Vital Signs Pulse 84   Temp (!) 93.3 F (34.1 C) (Rectal)   LMP 10/11/2019  Physical Exam Vitals and nursing note reviewed.   48 year old female, resting comfortably and in no acute distress. Vital signs are significant for low blood pressure. Oxygen saturation is 95%, which is normal. Head is normocephalic and atraumatic. PERRLA, EOMI.  Neck is nontender and supple without adenopathy or JVD. Lungs are clear without rales, wheezes, or rhonchi. Chest is nontender. Heart has regular rate and rhythm without murmur. Abdomen  is soft, flat, nontender. Extremities have no cyanosis or edema, full range of motion is present. Skin is warm and dry without rash.  AV fistula is present in the left upper arm with thrill present. Neurologic: Awake and alert, oriented to person and place but not time, cranial nerves are intact, moves all extremities equally.  ED Results / Procedures / Treatments   Labs (all labs ordered are listed, but only abnormal results are displayed) Labs Reviewed  COMPREHENSIVE METABOLIC PANEL WITH GFR - Abnormal; Notable  for the following components:      Result Value   Glucose, Bld 211 (*)    BUN 37 (*)    Creatinine, Ser 6.50 (*)    Calcium  8.7 (*)    GFR, Estimated 7 (*)    All other components within normal limits  CBC WITH DIFFERENTIAL/PLATELET - Abnormal; Notable for the following components:   WBC 12.3 (*)    Hemoglobin 11.9 (*)    MCV 77.9 (*)    MCH 25.0 (*)    RDW 17.4 (*)    Neutro Abs 8.7 (*)    Eosinophils Absolute 0.7 (*)    All other components within normal limits  CBG MONITORING, ED - Abnormal; Notable for the following components:   Glucose-Capillary 195 (*)    All other components within normal limits  ETHANOL  MAGNESIUM     EKG EKG Interpretation Date/Time:  Sunday Feb 01 2024 00:31:23 EDT Ventricular Rate:  85 PR Interval:  202 QRS Duration:  98 QT Interval:  445 QTC Calculation: 530 R Axis:   -43  Text Interpretation: Sinus rhythm Borderline prolonged PR interval LAE, consider biatrial enlargement RSR' in V1 or V2, probably normal variant LVH with secondary repolarization abnormality Prolonged QT interval When compared with ECG of 11/01/2023, No significant change was found Confirmed by Alissa April ([id number]) on 02/01/2024 1:02:24 AM  Radiology DG Chest Port 1 View Result Date: 02/01/2024 CLINICAL DATA:  overdose EXAM: PORTABLE CHEST 1 VIEW COMPARISON:  Chest x-ray 11/01/2023, CT chest 07/07/2023 FINDINGS: The heart and mediastinal contours are unchanged. No focal consolidation. No pulmonary edema. No pleural effusion. No pneumothorax. No acute osseous abnormality. IMPRESSION: Cardiomegaly with no active disease. Underlying pericardial effusion not excluded on frontal view. Electronically Signed   By: Morgane  Naveau M.D.   On: 02/01/2024 01:30    Procedures Procedures  Cardiac monitor shows normal sinus rhythm, per my interpretation.  Medications Ordered in ED Medications  ondansetron  (ZOFRAN ) injection 4 mg (4 mg Intravenous Given 02/01/24 2956)    ED Course/  Medical Decision Making/ A&P                                 Medical Decision Making Amount and/or Complexity of Data Reviewed Labs: ordered. Radiology: ordered.  Risk Prescription drug management.   Apparent accidental opioid overdose-most likely fentanyl .  Hypothermia likely secondary to opioid overdose.  I have ordered the patient be placed on a warming blanket and I have ordered routine screening labs.  I have ordered chest x-ray and ECG.  I have reviewed her past records, and I see no prior ED visits or admissions for overdose.  2:33 AM Temperature has increased somewhat, still hypothermic.  I have reevaluated the patient, and she is more awake and alert and does remember using cocaine tonight.  I have reviewed her laboratory test, my interpretation is undetectable ethanol, elevated BUN and creatinine consistent with known history of  end-stage renal disease, mild leukocytosis which is nonspecific, mild anemia which is likely secondary to end-stage renal disease and is actually improved compared with baseline.  Plan is to observe her for a total of 4 hours and recheck her temperature.  6:40 AM Temperature has come up to normal, she is awake and alert.  She did have an episode of vomiting and I ordered a dose of ondansetron .  I am discharging her with prescriptions for ondansetron  oral dissolving tablet and I have advised her never to use street drugs since she does not know what she is getting and can easily overdose.  She will need to go for her scheduled dialysis tomorrow.  Final Clinical Impression(s) / ED Diagnoses Final diagnoses:  Opiate overdose, accidental or unintentional, initial encounter (HCC)  Hypothermia, initial encounter  End-stage renal disease on hemodialysis (HCC)  Nausea and vomiting, unspecified vomiting type    Rx / DC Orders ED Discharge Orders          Ordered    ondansetron  (ZOFRAN -ODT) 4 MG disintegrating tablet  Every 8 hours PRN        02/01/24 0639               Alissa April, MD 02/01/24 0310    Alissa April, MD 02/01/24 [phone]

## 2024-02-01 NOTE — ED Triage Notes (Signed)
 BIBA from home possible OD EMS reports cocaine use. Boyfriend called 911 patient was unresponsive. EMS arrived patient was responsive to painful stimuli, patient was drooling, not able to answer questions. Narcan 0.5mg  admin PTA by EMS with small results.  On arrival patient able to say one word answers. EMS reports CBG on 233 Patient is dialysis MWF had dialysis yesterday. EMS reports patient does not have have sz history. Husband reports cocaine was bought by regular dealer.

## 2024-02-01 NOTE — ED Notes (Signed)
 Patient a/o no acute distress noted vss. Reviewed discharge instructions with patient answered questions IV dc'ed by patient on accident. Encouraged to return if s/s persist or become bothersome. Called husband several times no answer. Patient assisted lobby via wheelchair.

## 2024-02-11 ENCOUNTER — Encounter (HOSPITAL_COMMUNITY): Payer: Self-pay

## 2024-02-11 ENCOUNTER — Other Ambulatory Visit: Payer: Self-pay

## 2024-02-11 ENCOUNTER — Other Ambulatory Visit (HOSPITAL_COMMUNITY): Payer: Self-pay

## 2024-02-11 NOTE — Telephone Encounter (Signed)
 Pt called and verified the following pharmacy.  [id number] - , [place] - [address] AT [facility] [address] [[id number]]

## 2024-02-12 NOTE — Telephone Encounter (Signed)
 I spoke to [facility] today and they confirmed receipt of the [facility] request.  The call is documented in another telephone encounter today.

## 2024-02-12 NOTE — Telephone Encounter (Signed)
 I spoke to [facility] [place] [facility]: [phone] who confirmed that they received the [facility] request, and have been calling the patient to schedule an assessment but have not been able to reach her.  I called the patient and informed her that [place] [facility] has not been able to reach her and she needs to call them to schedule an assessment.  She said she needs PCS and I gave her their phone number and instructed her to call.  She also confirmed her appointment at [facility] on 02/17/2024.

## 2024-02-17 ENCOUNTER — Encounter: Payer: Self-pay | Admitting: Family Medicine

## 2024-02-17 ENCOUNTER — Ambulatory Visit: Attending: Family Medicine | Admitting: Family Medicine

## 2024-02-17 VITALS — BP 150/77 | HR 87 | Ht 65.0 in | Wt 140.6 lb

## 2024-02-17 DIAGNOSIS — Z992 Dependence on renal dialysis: Secondary | ICD-10-CM

## 2024-02-17 DIAGNOSIS — R159 Full incontinence of feces: Secondary | ICD-10-CM

## 2024-02-17 DIAGNOSIS — I120 Hypertensive chronic kidney disease with stage 5 chronic kidney disease or end stage renal disease: Secondary | ICD-10-CM | POA: Diagnosis not present

## 2024-02-17 DIAGNOSIS — L84 Corns and callosities: Secondary | ICD-10-CM

## 2024-02-17 DIAGNOSIS — I69398 Other sequelae of cerebral infarction: Secondary | ICD-10-CM

## 2024-02-17 DIAGNOSIS — R2681 Unsteadiness on feet: Secondary | ICD-10-CM

## 2024-02-17 DIAGNOSIS — E1122 Type 2 diabetes mellitus with diabetic chronic kidney disease: Secondary | ICD-10-CM

## 2024-02-17 DIAGNOSIS — N186 End stage renal disease: Secondary | ICD-10-CM | POA: Diagnosis not present

## 2024-02-17 DIAGNOSIS — I69393 Ataxia following cerebral infarction: Secondary | ICD-10-CM

## 2024-02-17 MED ORDER — MISC. DEVICES MISC: 12 refills | Status: DC

## 2024-02-17 MED ORDER — MISC. DEVICES MISC: 0 refills | Status: DC

## 2024-02-17 MED ORDER — HYDRALAZINE HCL 25 MG PO TABS: 25.0000 mg | ORAL_TABLET | Freq: Two times a day (BID) | ORAL | 1 refills | Status: DC

## 2024-02-17 NOTE — Patient Instructions (Signed)
 VISIT SUMMARY:  Today, we addressed your elevated blood pressure and fecal incontinence. We also discussed your need for a new walker and home care assistance.  YOUR PLAN:  -HYPERTENSION: Hypertension means high blood pressure. Your blood pressure remains high despite your current medications. We are adding hydralazine  25 mg twice daily to your regimen, refilling your amlodipine  prescription, and requesting refills for carvedilol . Please monitor your blood pressure regularly, especially on dialysis days.  -FECAL INCONTINENCE: Fecal incontinence means you are unable to control your bowel movements. This may be linked to one of your medications, sevelamer  carbonate. We recommend taking Miralax  daily to help with bowel movements and will discuss the medication effects with your nephrologist.  -MOBILITY ASSISTANCE: You need a new walker and home care assistance. We will message your case manager to follow up on the home care services for your walker and supplies, and we will arrange PCS services for additional home assistance.  I also placed a physical therapy referral per your request  CALLUS: I referred you to the podiatrist for evaluation of this.  INSTRUCTIONS:  Please monitor your blood pressure regularly, especially on dialysis days. Take hydralazine  25 mg twice daily as prescribed. Use Miralax  daily to help with bowel movements. We will follow up with your case manager and arrange PCS services for home assistance.

## 2024-02-17 NOTE — Progress Notes (Signed)
 Subjective:  Patient ID: Stacey Lucas, female    DOB: May 21, 1976  Age: 48 y.o. MRN: [id number]  CC: Medical Management of Chronic Issues (Painful spot on (r) foot )     Discussed the use of AI scribe software for clinical note transcription with the patient, who gave verbal consent to proceed.  History of Present Illness Stacey Lucas is a 48 year old female with a history of end-stage renal disease on hemodialysis, hypertension, type 2 diabetes mellitus, hyperlipidemia, and a history of three strokes resulting in residual right leg weakness who presents with elevated blood pressure and fecal incontinence.  She is on amlodipine  and carvedilol  for hypertension. Her blood pressure is slightly elevated today and often high during dialysis, sometimes reaching 200 mmHg. She is about to run out of amlodipine  and requires a refill.  She experiences fecal incontinence, which she associates with Renvela , used for managing high phosphorus levels. She soils herself without feeling the urge and has irregular bowel movements with occasional constipation. She urinates once daily without urinary incontinence.   She uses a walker due to a stroke two years ago and needs a new one.  A cane will not suffice.  She requires bed pads and diapers for incontinence. A prescription for a shower chair, rolling walker with a seat, pull-ups, bed pads, and body wipes was written on March 7th but not received.She is requesting a referral to physical therapy due to her abnormal gait.  She requests PCS services. She also complains of a callus on her foot. She seeks assistance at home and mentions losing all medications after her mother's death in November 18, 2023, currently managing her prescriptions.    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  12/29/2021   Anxiety and depression    Atrial fibrillation (HCC)    Cerebral  thrombosis with cerebral infarction 05/24/2021   Cocaine abuse (HCC)    Depression with suicidal ideation    [facility] admission 04/2018   Diabetes mellitus without complication (HCC)    Type II   End stage kidney disease (HCC)    GERD (gastroesophageal reflux disease)    Hyperlipidemia    Hypertension    Impingement syndrome of right shoulder    Migraine headache    Osteoarthritis of right knee 05/24/2021   Pilonidal abscess 05/2013   Pyelonephritis    Right thyroid nodule    Sciatica     Past Surgical History:  Procedure Laterality Date   AV FISTULA PLACEMENT Left 03/17/2023   Procedure: LEFT ARM BRACHIOCEPHALIC ARTERIOVENOUS (AV) FISTULA CREATION;  Surgeon: Kayla Part, MD;  Location: [facility] OR;  Service: Vascular;  Laterality: Left;   BUBBLE STUDY  01/01/2022   Procedure: BUBBLE STUDY;  Surgeon: Bridgette Campus, MD;  Location: [facility] ENDOSCOPY;  Service: Cardiovascular;;   IR FLUORO GUIDE CV LINE RIGHT  03/14/2023   IR US  GUIDE VASC ACCESS RIGHT  03/14/2023   TEE WITHOUT CARDIOVERSION N/A 01/01/2022   Procedure: TRANSESOPHAGEAL ECHOCARDIOGRAM (TEE);  Surgeon: Bridgette Campus, MD;  Location: [facility] ENDOSCOPY;  Service: Cardiovascular;  Laterality: N/A;   tubal     TUBAL LIGATION      Family History  Problem Relation Age of Onset   Diabetes Mother    Hypertension Mother    Arthritis Mother    Diabetes Father    Hypertension Father     Social History   Socioeconomic History   Marital status:  Single    Spouse name: Not on file   Number of children: 3   Years of education: Not on file   Highest education level: Not on file  Occupational History   Not on file  Tobacco Use   Smoking status: Every Day    Current packs/day: 0.00    Types: Cigarettes    Last attempt to quit: 01/16/2022    Years since quitting: 2.0    Passive exposure: Current   Smokeless tobacco: Never  Vaping Use   Vaping status: Never Used  Substance and Sexual Activity   Alcohol use: Yes    Comment:  Socially   Drug use: No    Comment: Denies despite h/o cocaine use   Sexual activity: Yes    Birth control/protection: None  Other Topics Concern   Not on file  Social History Narrative   Patient reports she lives with her boyfriend. She currently is disabled and doesn't work.    Social Drivers of Health   Financial Resource Strain: High Risk (10/29/2023)   Received from [facility]   Overall Financial Resource Strain (CARDIA)    Difficulty of Paying Living Expenses: Very hard  Food Insecurity: Food Insecurity Present (02/17/2024)   Hunger Vital Sign    Worried About Running Out of Food in the Last Year: Never true    Ran Out of Food in the Last Year: Often true  Transportation Needs: No Transportation Needs (02/17/2024)   PRAPARE - Administrator, Civil Service (Medical): No    Lack of Transportation (Non-Medical): No  Physical Activity: Not on file  Stress: Stress Concern Present (03/20/2023)   [employer] of Occupational Health - Occupational Stress Questionnaire    Feeling of Stress : Rather much  Social Connections: Moderately Integrated (02/17/2024)   Social Connection and Isolation Panel [NHANES]    Frequency of Communication with Friends and Family: Once a week    Frequency of Social Gatherings with Friends and Family: Twice a week    Attends Religious Services: Never    Database administrator or Organizations: Yes    Attends Banker Meetings: Never    Marital Status: Living with partner    Allergies  Allergen Reactions   Neurontin  [Gabapentin ] Other (See Comments)    Weakness  Balance impairment     Outpatient Medications Prior to Visit  Medication Sig Dispense Refill   amLODipine  (NORVASC ) 10 MG tablet Take 1 tablet (10 mg total) by mouth daily. 90 tablet 1   apixaban  (ELIQUIS ) 2.5 MG TABS tablet Take 1 tablet (2.5 mg total) by mouth 2 (two) times daily. 180 tablet 1   atorvastatin  (LIPITOR ) 40 MG tablet Take 1 tablet (40 mg  total) by mouth daily. 30 tablet 0   buPROPion  (WELLBUTRIN  XL) 150 MG 24 hr tablet Take 1 tablet (150 mg total) by mouth daily. 90 tablet 1   carvedilol  (COREG ) 6.25 MG tablet Take 1 tablet (6.25 mg total) by mouth 2 (two) times daily with a meal. 180 tablet 1   DULoxetine  (CYMBALTA ) 30 MG capsule Take 1 capsule (30 mg total) by mouth daily for chronic pain 90 capsule 1   hydrOXYzine  (ATARAX ) 25 MG tablet Take 1 tablet (25 mg total) by mouth at bedtime as needed. 90 tablet 1   nicotine  polacrilex (NICORETTE) 4 MG gum Take by mouth.     ondansetron  (ZOFRAN -ODT) 4 MG disintegrating tablet Take 1 tablet (4 mg total) by mouth every 8 (eight) hours as  needed for nausea or vomiting. 20 tablet 0   sevelamer  carbonate (RENVELA ) 800 MG tablet Take 800 mg by mouth 3 (three) times daily with meals.     Misc. Devices MISC adult pull ups (medium), bed pads (large), body wipes (large) .  Diagnosis end-stage renal disease on hemodialysis 1 each 12   Misc. Devices MISC Shower chair, rolling walker with seat.  Diagnosis end-stage renal disease 1 each 0   No facility-administered medications prior to visit.     ROS Review of Systems  Constitutional:  Negative for activity change and appetite change.  HENT:  Negative for sinus pressure and sore throat.   Respiratory:  Negative for chest tightness, shortness of breath and wheezing.   Cardiovascular:  Negative for chest pain and palpitations.  Gastrointestinal:  Negative for abdominal distention, abdominal pain and constipation.  Genitourinary: Negative.   Musculoskeletal:  Positive for gait problem.  Skin:  Positive for rash.  Neurological:  Positive for weakness.  Psychiatric/Behavioral:  Negative for behavioral problems and dysphoric mood.     Objective:  BP (!) 150/77   Pulse 87   Ht 5\' 5"  (1.651 m)   Wt 140 lb 9.6 oz (63.8 kg)   LMP 10/11/2019   SpO2 100%   BMI 23.40 kg/m      02/17/2024   10:00 AM 02/17/2024    8:57 AM 02/01/2024    6:00 AM   BP/Weight  Systolic BP 150 159 155  Diastolic BP 77 79 77  Wt. (Lbs)  140.6   BMI  23.4 kg/m2       Physical Exam Constitutional:      Appearance: She is well-developed.  Cardiovascular:     Rate and Rhythm: Normal rate.     Heart sounds: Normal heart sounds. No murmur heard. Pulmonary:     Effort: Pulmonary effort is normal.     Breath sounds: Normal breath sounds. No wheezing or rales.  Chest:     Chest wall: No tenderness.  Abdominal:     General: Bowel sounds are normal. There is no distension.     Palpations: Abdomen is soft. There is no mass.     Tenderness: There is no abdominal tenderness.  Musculoskeletal:        General: Normal range of motion.     Right lower leg: No edema.     Left lower leg: No edema.  Skin:    Comments: Sole of right foot with callus  Neurological:     Mental Status: She is alert and oriented to person, place, and time.  Psychiatric:        Mood and Affect: Mood normal.        Latest Ref Rng & Units 02/01/2024   12:44 AM 11/02/2023    7:00 AM 11/01/2023    8:43 PM  CMP  Glucose 70 - 99 mg/dL 536  144  98   BUN 6 - 20 mg/dL 37  22  45   Creatinine 0.44 - 1.00 mg/dL 3.15  4.00  8.67   Sodium 135 - 145 mmol/L 139  136  139   Potassium 3.5 - 5.1 mmol/L 5.0  3.8  6.6   Chloride 98 - 111 mmol/L 103  92  103   CO2 22 - 32 mmol/L 23  30    Calcium  8.9 - 10.3 mg/dL 8.7  9.1    Total Protein 6.5 - 8.1 g/dL 7.9  7.8    Total Bilirubin 0.0 - 1.2 mg/dL 0.4  1.2    Alkaline Phos 38 - 126 U/L 104  64    AST 15 - 41 U/L 18  20    ALT 0 - 44 U/L 9  15      Lipid Panel     Component Value Date/Time   CHOL 190 11/15/2022 0618   CHOL 218 (H) 03/05/2022 1045   TRIG 205 (H) 11/15/2022 0618   HDL 50 11/15/2022 0618   HDL 76 03/05/2022 1045   CHOLHDL 3.8 11/15/2022 0618   VLDL 41 (H) 11/15/2022 0618   LDLCALC 99 11/15/2022 0618   LDLCALC 120 (H) 03/05/2022 1045    CBC    Component Value Date/Time   WBC 12.3 (H) 02/01/2024 0044   RBC  4.76 02/01/2024 0044   HGB 11.9 (L) 02/01/2024 0044   HGB 10.9 (L) 03/05/2022 1045   HCT 37.1 02/01/2024 0044   HCT 32.6 (L) 03/05/2022 1045   PLT 183 02/01/2024 0044   PLT 287 03/05/2022 1045   MCV 77.9 (L) 02/01/2024 0044   MCV 77 (L) 03/05/2022 1045   MCH 25.0 (L) 02/01/2024 0044   MCHC 32.1 02/01/2024 0044   RDW 17.4 (H) 02/01/2024 0044   RDW 14.0 03/05/2022 1045   LYMPHSABS 2.1 02/01/2024 0044   LYMPHSABS 3.0 03/05/2022 1045   MONOABS 0.7 02/01/2024 0044   EOSABS 0.7 (H) 02/01/2024 0044   EOSABS 0.3 03/05/2022 1045   BASOSABS 0.1 02/01/2024 0044   BASOSABS 0.0 03/05/2022 1045    Lab Results  Component Value Date   HGBA1C 6.5 11/12/2023      1. Hypertension due to end stage renal disease caused by type 2 diabetes mellitus, on dialysis (HCC) Uncontrolled Hydralazine  added to regimen Will exercise caution in BP management to prevent hypotension during dialysis Continue amlodipine  and Coreg  - hydrALAZINE  (APRESOLINE ) 25 MG tablet; Take 1 tablet (25 mg total) by mouth 2 (two) times daily.  Dispense: 180 tablet; Refill: 1  2. Gait disturbance, post-stroke (Primary) - Misc. Devices MISC; Shower chair, rolling walker with seat.  Diagnosis end-stage renal disease  Dispense: 1 each; Refill: 0 - Ambulatory referral to Physical Therapy - Misc. Devices MISC; adult pull ups (medium), bed pads (large), body wipes (large) .  Diagnosis end-stage renal disease on hemodialysis  Dispense: 1 each; Refill: 12  3. Incontinence of feces, unspecified fecal incontinence type - Misc. Devices MISC; Shower chair, rolling walker with seat.  Diagnosis end-stage renal disease  Dispense: 1 each; Refill: 0 - Misc. Devices MISC; adult pull ups (medium), bed pads (large), body wipes (large) .  Diagnosis end-stage renal disease on hemodialysis  Dispense: 1 each; Refill: 12  4. Callus of foot - Ambulatory referral to Podiatry  Meds ordered this encounter  Medications   DISCONTD: Misc. Devices MISC     Sig: adult pull ups (medium), bed pads (large), body wipes (large) .  Diagnosis end-stage renal disease on hemodialysis    Dispense:  1 each    Refill:  12   Misc. Devices MISC    Sig: Shower chair, rolling walker with seat.  Diagnosis end-stage renal disease    Dispense:  1 each    Refill:  0   hydrALAZINE  (APRESOLINE ) 25 MG tablet    Sig: Take 1 tablet (25 mg total) by mouth 2 (two) times daily.    Dispense:  180 tablet    Refill:  1   Misc. Devices MISC    Sig: adult pull ups (medium), bed pads (large), body wipes (large) .  Diagnosis end-stage renal disease on hemodialysis    Dispense:  1 each    Refill:  12    Follow-up: Return in about 3 months (around 05/19/2024) for Chronic medical conditions.       Joaquin Mulberry, MD, FAAFP. [facility] [place], [place] [phone]   02/17/2024, 11:55 AM

## 2024-02-20 ENCOUNTER — Ambulatory Visit

## 2024-02-25 ENCOUNTER — Other Ambulatory Visit (HOSPITAL_COMMUNITY): Payer: Self-pay

## 2024-02-27 ENCOUNTER — Encounter (HOSPITAL_COMMUNITY): Payer: Self-pay

## 2024-02-27 ENCOUNTER — Emergency Department (HOSPITAL_COMMUNITY): Admission: EM | Admit: 2024-02-27 | Discharge: 2024-02-28 | Disposition: A | Discharge status: Home / Self Care

## 2024-02-27 ENCOUNTER — Other Ambulatory Visit: Payer: Self-pay

## 2024-02-27 ENCOUNTER — Emergency Department (HOSPITAL_COMMUNITY)

## 2024-02-27 DIAGNOSIS — Z79899 Other long term (current) drug therapy: Secondary | ICD-10-CM | POA: Diagnosis not present

## 2024-02-27 DIAGNOSIS — M898X9 Other specified disorders of bone, unspecified site: Secondary | ICD-10-CM | POA: Insufficient documentation

## 2024-02-27 DIAGNOSIS — Z91158 Patient's noncompliance with renal dialysis for other reason: Secondary | ICD-10-CM | POA: Diagnosis not present

## 2024-02-27 DIAGNOSIS — I120 Hypertensive chronic kidney disease with stage 5 chronic kidney disease or end stage renal disease: Secondary | ICD-10-CM | POA: Diagnosis not present

## 2024-02-27 DIAGNOSIS — Z992 Dependence on renal dialysis: Secondary | ICD-10-CM | POA: Insufficient documentation

## 2024-02-27 DIAGNOSIS — E1122 Type 2 diabetes mellitus with diabetic chronic kidney disease: Secondary | ICD-10-CM | POA: Diagnosis not present

## 2024-02-27 DIAGNOSIS — R0602 Shortness of breath: Secondary | ICD-10-CM | POA: Insufficient documentation

## 2024-02-27 DIAGNOSIS — I10 Essential (primary) hypertension: Secondary | ICD-10-CM | POA: Diagnosis not present

## 2024-02-27 DIAGNOSIS — Z7901 Long term (current) use of anticoagulants: Secondary | ICD-10-CM | POA: Diagnosis not present

## 2024-02-27 DIAGNOSIS — R112 Nausea with vomiting, unspecified: Secondary | ICD-10-CM | POA: Diagnosis present

## 2024-02-27 DIAGNOSIS — E875 Hyperkalemia: Secondary | ICD-10-CM | POA: Diagnosis not present

## 2024-02-27 DIAGNOSIS — Z8673 Personal history of transient ischemic attack (TIA), and cerebral infarction without residual deficits: Secondary | ICD-10-CM | POA: Insufficient documentation

## 2024-02-27 DIAGNOSIS — N186 End stage renal disease: Secondary | ICD-10-CM | POA: Diagnosis not present

## 2024-02-27 LAB — CBC
HCT: 36.4 % (ref 36.0–46.0)
Hemoglobin: 11.9 g/dL — ABNORMAL LOW (ref 12.0–15.0)
MCH: 25.5 pg — ABNORMAL LOW (ref 26.0–34.0)
MCHC: 32.7 g/dL (ref 30.0–36.0)
MCV: 78.1 fL — ABNORMAL LOW (ref 80.0–100.0)
Platelets: 187 10*3/uL (ref 150–400)
RBC: 4.66 MIL/uL (ref 3.87–5.11)
RDW: 19.7 % — ABNORMAL HIGH (ref 11.5–15.5)
WBC: 6.5 10*3/uL (ref 4.0–10.5)
nRBC: 0 % (ref 0.0–0.2)

## 2024-02-27 LAB — BASIC METABOLIC PANEL WITH GFR
Anion gap: 20 — ABNORMAL HIGH (ref 5–15)
BUN: 74 mg/dL — ABNORMAL HIGH (ref 6–20)
CO2: 19 mmol/L — ABNORMAL LOW (ref 22–32)
Calcium: 8.2 mg/dL — ABNORMAL LOW (ref 8.9–10.3)
Chloride: 99 mmol/L (ref 98–111)
Creatinine, Ser: 11.8 mg/dL — ABNORMAL HIGH (ref 0.44–1.00)
GFR, Estimated: 4 mL/min — ABNORMAL LOW (ref >60)
Glucose, Bld: 126 mg/dL — ABNORMAL HIGH (ref 70–99)
Potassium: 6.1 mmol/L — ABNORMAL HIGH (ref 3.5–5.1)
Sodium: 138 mmol/L (ref 135–145)

## 2024-02-27 LAB — HEPATITIS B SURFACE ANTIGEN: Hepatitis B Surface Ag: NONREACTIVE

## 2024-02-27 MED ORDER — SODIUM ZIRCONIUM CYCLOSILICATE 10 G PO PACK
10.0000 g | PACK | Freq: Once | ORAL | Status: AC
  Administered 2024-02-27: 10 g via ORAL
  Filled 2024-02-27: qty 1

## 2024-02-27 MED ORDER — HYDRALAZINE HCL 25 MG PO TABS
25.0000 mg | ORAL_TABLET | Freq: Once | ORAL | Status: AC
  Administered 2024-02-27: 25 mg via ORAL
  Filled 2024-02-27: qty 1

## 2024-02-27 MED ORDER — PENTAFLUOROPROP-TETRAFLUOROETH EX AERO: 1.0000 | INHALATION_SPRAY | CUTANEOUS | Status: DC | PRN

## 2024-02-27 MED ORDER — LIDOCAINE-PRILOCAINE 2.5-2.5 % EX CREA: 1.0000 | TOPICAL_CREAM | CUTANEOUS | Status: DC | PRN

## 2024-02-27 MED ORDER — ALTEPLASE 2 MG IJ SOLR: 2.0000 mg | Freq: Once | INTRAMUSCULAR | Status: DC | PRN

## 2024-02-27 MED ORDER — LIDOCAINE HCL (PF) 1 % IJ SOLN: 5.0000 mL | INTRAMUSCULAR | Status: DC | PRN

## 2024-02-27 MED ORDER — ANTICOAGULANT SODIUM CITRATE 4% (200MG/5ML) IV SOLN
5.0000 mL | Status: DC | PRN
  Filled 2024-02-27: qty 5

## 2024-02-27 MED ORDER — AMLODIPINE BESYLATE 5 MG PO TABS
10.0000 mg | ORAL_TABLET | Freq: Once | ORAL | Status: AC
  Administered 2024-02-27: 10 mg via ORAL
  Filled 2024-02-27: qty 2

## 2024-02-27 MED ORDER — HEPARIN SODIUM (PORCINE) 1000 UNIT/ML DIALYSIS: 1000.0000 [IU] | INTRAMUSCULAR | Status: DC | PRN

## 2024-02-27 MED ORDER — CHLORHEXIDINE GLUCONATE CLOTH 2 % EX PADS: 6.0000 | MEDICATED_PAD | Freq: Every day | CUTANEOUS | Status: DC

## 2024-02-27 NOTE — ED Provider Triage Note (Signed)
 Emergency Medicine Provider Triage Evaluation Note  Stacey Lucas , a 48 y.o. female  was evaluated in triage.  Pt complains of vomiting, difficulty breathing, missed dialysis.  Patient is scheduled for dialysis Monday/Wednesday/Friday, has not been since Monday as she was moving on Wednesday and did not have a ride today.  She has been vomiting most of the day today and says it feels hard to breathe.  She denies chest pain but does report occasional fluttering sensation in her chest.  Review of Systems  Positive: As above Negative: As above  Physical Exam  BP (!) 201/94   Pulse 91   Temp 98.3 F (36.8 C)   Resp 18   Ht 5' 5 (1.651 m)   Wt 59 kg   LMP 10/11/2019   SpO2 100%   BMI 21.63 kg/m  Gen:   Awake, no distress   Resp:  Normal effort  MSK:   Moves extremities without difficulty  Other:    Medical Decision Making  Medically screening exam initiated at 4:17 PM.  Appropriate orders placed.  Stacey Lucas was informed that the remainder of the evaluation will be completed by another provider, this initial triage assessment does not replace that evaluation, and the importance of remaining in the ED until their evaluation is complete.     Kendrick Pax, [place] 02/27/24 [phone]

## 2024-02-27 NOTE — ED Provider Notes (Signed)
 [place] EMERGENCY DEPARTMENT AT [facility] Provider Note   CSN: [id number] Arrival date & time: 02/27/24  1541     Patient presents with: Missed Dialysis and HTN   Stacey Lucas is a 48 y.o. female.   48 year old female presenting emergency department for missing dialysis with complaint of shortness of breath.  Monday Wednesday Friday.  Last full session on Monday.  Missed Wednesday as she reportedly was moving.  Last night she had some shortness of breath and difficulty breathing.  Felt nauseated and vomited.  Currently breathing is improved not having abdominal pain.  No chest pain.        Prior to Admission medications   Medication Sig Start Date End Date Taking? Authorizing Provider  amLODipine  (NORVASC ) 10 MG tablet Take 1 tablet (10 mg total) by mouth daily. 11/12/23   Newlin, Enobong, MD  apixaban  (ELIQUIS ) 2.5 MG TABS tablet Take 1 tablet (2.5 mg total) by mouth 2 (two) times daily. 11/12/23   Newlin, Enobong, MD  atorvastatin  (LIPITOR ) 40 MG tablet Take 1 tablet (40 mg total) by mouth daily. 07/08/23   Krishnan, Gokul, MD  buPROPion  (WELLBUTRIN  XL) 150 MG 24 hr tablet Take 1 tablet (150 mg total) by mouth daily. 11/12/23   Newlin, Enobong, MD  carvedilol  (COREG ) 6.25 MG tablet Take 1 tablet (6.25 mg total) by mouth 2 (two) times daily with a meal. 11/12/23   Joaquin Mulberry, MD  DULoxetine  (CYMBALTA ) 30 MG capsule Take 1 capsule (30 mg total) by mouth daily for chronic pain 11/12/23   Newlin, Enobong, MD  hydrALAZINE  (APRESOLINE ) 25 MG tablet Take 1 tablet (25 mg total) by mouth 2 (two) times daily. 02/17/24   Newlin, Enobong, MD  hydrOXYzine  (ATARAX ) 25 MG tablet Take 1 tablet (25 mg total) by mouth at bedtime as needed. 11/12/23   Newlin, Enobong, MD  Misc. Devices MISC Shower chair, rolling walker with seat.  Diagnosis end-stage renal disease 02/17/24   Joaquin Mulberry, MD  Misc. Devices MISC adult pull ups (medium), bed pads (large), body wipes (large) .  Diagnosis  end-stage renal disease on hemodialysis 02/17/24   Joaquin Mulberry, MD  nicotine  polacrilex (NICORETTE) 4 MG gum Take by mouth. 10/31/23   [provider]  ondansetron  (ZOFRAN -ODT) 4 MG disintegrating tablet Take 1 tablet (4 mg total) by mouth every 8 (eight) hours as needed for nausea or vomiting. 02/01/24   Alissa April, MD  sevelamer  carbonate (RENVELA ) 800 MG tablet Take 800 mg by mouth 3 (three) times daily with meals. 05/15/23 05/14/24  [provider]    Allergies: Neurontin  [gabapentin ]    Review of Systems  Updated Vital Signs BP (!) 192/89   Pulse 93   Temp 98.5 F (36.9 C) (Oral)   Resp (!) 21   Ht 5' 5 (1.651 m)   Wt 59 kg   LMP 10/11/2019   SpO2 98%   BMI 21.63 kg/m   Physical Exam Vitals and nursing note reviewed.  Constitutional:      General: She is not in acute distress.    Appearance: She is not toxic-appearing.  HENT:     Nose: Nose normal.     Mouth/Throat:     Mouth: Mucous membranes are moist.   Eyes:     Conjunctiva/sclera: Conjunctivae normal.    Cardiovascular:     Rate and Rhythm: Normal rate and regular rhythm.  Pulmonary:     Effort: Pulmonary effort is normal.     Breath sounds: Normal breath sounds.  Abdominal:     General: Abdomen is flat. There is no distension.     Tenderness: There is no abdominal tenderness. There is no guarding or rebound.   Musculoskeletal:        General: Normal range of motion.   Skin:    General: Skin is warm.     Capillary Refill: Capillary refill takes less than 2 seconds.   Neurological:     Mental Status: She is alert and oriented to person, place, and time.   Psychiatric:        Mood and Affect: Mood normal.        Behavior: Behavior normal.     (all labs ordered are listed, but only abnormal results are displayed) Labs Reviewed  BASIC METABOLIC PANEL WITH GFR - Abnormal; Notable for the following components:      Result Value   Potassium 6.1 (*)    CO2 19 (*)    Glucose, Bld  126 (*)    BUN 74 (*)    Creatinine, Ser 11.80 (*)    Calcium  8.2 (*)    GFR, Estimated 4 (*)    Anion gap 20 (*)    All other components within normal limits  CBC - Abnormal; Notable for the following components:   Hemoglobin 11.9 (*)    MCV 78.1 (*)    MCH 25.5 (*)    RDW 19.7 (*)    All other components within normal limits  HEPATITIS B SURFACE ANTIGEN  HEPATITIS B SURFACE ANTIBODY, QUANTITATIVE    EKG: None  Radiology: DG Chest 2 View Result Date: 02/27/2024 CLINICAL DATA:  Shortness of breath EXAM: CHEST - 2 VIEW COMPARISON:  02/01/2024 FINDINGS: Mild cardiomegaly. No acute airspace disease, pleural effusion or pneumothorax. IMPRESSION: No active cardiopulmonary disease. Mild cardiomegaly. Electronically Signed   By: Esmeralda Hedge M.D.   On: 02/27/2024 19:04     Procedures   Medications Ordered in the ED  Chlorhexidine  Gluconate Cloth 2 % PADS 6 each (has no administration in time range)  pentafluoroprop-tetrafluoroeth (GEBAUERS) aerosol 1 Application (has no administration in time range)  lidocaine  (PF) (XYLOCAINE ) 1 % injection 5 mL (has no administration in time range)  lidocaine -prilocaine  (EMLA ) cream 1 Application (has no administration in time range)  heparin  injection 1,000 Units (has no administration in time range)  anticoagulant sodium citrate  solution 5 mL (has no administration in time range)  alteplase  (CATHFLO ACTIVASE ) injection 2 mg (has no administration in time range)  heparin  injection 1,000 Units (has no administration in time range)  sodium zirconium cyclosilicate  (LOKELMA ) packet 10 g (10 g Oral Given 02/27/24 1850)  hydrALAZINE  (APRESOLINE ) tablet 25 mg (25 mg Oral Given 02/27/24 2012)  amLODipine  (NORVASC ) tablet 10 mg (10 mg Oral Given 02/27/24 2011)    Clinical Course as of 02/27/24 2339  Fri Feb 27, 2024  1826 Spoke with Leandra Pro with nephro. Will plan for dialysis tonight after a few in front of her.  [TY]    Clinical Course User  Index [TY] Rolinda Climes, DO                                 Medical Decision Making 48 year old female presenting emergency department missing dialysis.  Afebrile, nontachycardic, maintaining oxygen saturation on room air.  Is hypertensive history of the same.  Clinically slightly hypervolemic, but does not appear to be in respiratory distress.  Lungs largely clear.  Chest x-ray  clear as well.  Labs with hyperkalemia.  EKG without changes.  Was given Lokelma .  Discussed case with nephrology; plan for dialysis this evening.  Anticipate discharge following dialysis.  Care signed out to overnight team dispo pending dialysis and reevaluation.  Amount and/or Complexity of Data Reviewed External Data Reviewed:     Details: Complicated past medical history to include diabetes, cocaine use, hypertension, anxiety, atrial fibrillation on Eliquis , CVA Labs: ordered. Decision-making details documented in ED Course.    Details: See above Radiology: ordered and independent interpretation performed.    Details: Does not appear to be fluid overload. ECG/medicine tests: independent interpretation performed.    Details: Does not have changes consistent with hyperkalemia.  Risk Prescription drug management. Decision regarding hospitalization. Diagnosis or treatment significantly limited by social determinants of health. Risk Details: History of substance abuse.  Poor health literacy      Final diagnoses:  None    ED Discharge Orders     None          Rolinda Climes, DO 02/27/24 2339

## 2024-02-27 NOTE — ED Triage Notes (Signed)
 Pt came in via POV d/t  missing her dialysis since her last Tx on 4 days ago. Usually goes on M/W/F & did not take her BP meds this morning d/t needing her Tx today but did not make the appointment for the Tx. A/Ox4, reports some SOB, lightheadedness & feeling bad. BP during triage is 201/94.

## 2024-02-27 NOTE — Discharge Instructions (Signed)
 Please go to your dialysis schedules as scheduled.  Return if felt fevers, chills, chest pain, shortness of breath, sudden onset headache, vision loss, facial droop, unilateral weakness, or any new or worsening symptoms that are concerning to you.

## 2024-02-27 NOTE — Consult Note (Signed)
 Reason for Consult: To manage dialysis and dialysis related needs Referring Physician: Dr Stacey Lucas is an 48 y.o. female.  HPI: Pt is a 38F with a PMH sig for ESRD on HD, h/o DM II, h/o CVA x 3 who is now seen in consultation at the request of Stacey. Stacey Lucas for provision of HD and management of ESRD.    Pt attends HD MWF at [facility]  Missed [facility].  Comes here seeking HD rx.    No specific complaints.  K is 6.1.    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  12/29/2021   Anxiety and depression    Atrial fibrillation (HCC)    Cerebral thrombosis with cerebral infarction 05/24/2021   Cocaine abuse (HCC)    Depression with suicidal ideation    [facility] admission 04/2018   Diabetes mellitus without complication (HCC)    Type II   End stage kidney disease (HCC)    GERD (gastroesophageal reflux disease)    Hyperlipidemia    Hypertension    Impingement syndrome of right shoulder    Migraine headache    Osteoarthritis of right knee 05/24/2021   Pilonidal abscess 05/2013   Pyelonephritis    Right thyroid nodule    Sciatica     Past Surgical History:  Procedure Laterality Date   AV FISTULA PLACEMENT Left 03/17/2023   Procedure: LEFT ARM BRACHIOCEPHALIC ARTERIOVENOUS (AV) FISTULA CREATION;  Surgeon: Kayla Part, MD;  Location: [facility] OR;  Service: Vascular;  Laterality: Left;   BUBBLE STUDY  01/01/2022   Procedure: BUBBLE STUDY;  Surgeon: Bridgette Campus, MD;  Location: [facility] ENDOSCOPY;  Service: Cardiovascular;;   IR FLUORO GUIDE CV LINE RIGHT  03/14/2023   IR US  GUIDE VASC ACCESS RIGHT  03/14/2023   TEE WITHOUT CARDIOVERSION N/A 01/01/2022   Procedure: TRANSESOPHAGEAL ECHOCARDIOGRAM (TEE);  Surgeon: Bridgette Campus, MD;  Location[facility] ENDOSCOPY;  Service: Cardiovascular;  Laterality: N/A;   tubal     TUBAL LIGATION      Family History  Problem Relation  Age of Onset   Diabetes Mother    Hypertension Mother    Arthritis Mother    Diabetes Father    Hypertension Father     Social History:  reports that she has been smoking cigarettes. She has been exposed to tobacco smoke. She has never used smokeless tobacco. She reports current alcohol use. She reports that she does not use drugs.  Allergies:  Allergies  Allergen Reactions   Neurontin  [Gabapentin ] Other (See Comments)    Weakness  Balance impairment     Medications: Scheduled:  [START ON 02/28/2024] Chlorhexidine  Gluconate Cloth  6 each Topical Q0600     Results for orders placed or performed during the hospital encounter of 02/27/24 (from the past 48 hours)  Basic metabolic panel     Status: Abnormal   Collection Time: 02/27/24  3:53 PM  Result Value Ref Range   Sodium 138 135 - 145 mmol/L   Potassium 6.1 (H) 3.5 - 5.1 mmol/L   Chloride 99 98 - 111 mmol/L   CO2 19 (L) 22 - 32 mmol/L   Glucose, Bld 126 (H) 70 - 99 mg/dL    Comment: Glucose reference range applies only to samples taken after fasting for at least 8 hours.   BUN 74 (H) 6 - 20 mg/dL   Creatinine, Ser  11.80 (H) 0.44 - 1.00 mg/dL   Calcium  8.2 (L) 8.9 - 10.3 mg/dL   GFR, Estimated 4 (L) >60 mL/min    Comment: (NOTE) Calculated using the CKD-EPI Creatinine Equation (2021)    Anion gap 20 (H) 5 - 15    Comment: Performed at [facility], [address], [place], [place] [address]  CBC     Status: Abnormal   Collection Time: 02/27/24  3:53 PM  Result Value Ref Range   WBC 6.5 4.0 - 10.5 K/uL   RBC 4.66 3.87 - 5.11 MIL/uL   Hemoglobin 11.9 (L) 12.0 - 15.0 g/dL   HCT 17.5 10.2 - 58.5 %   MCV 78.1 (L) 80.0 - 100.0 fL   MCH 25.5 (L) 26.0 - 34.0 pg   MCHC 32.7 30.0 - 36.0 g/dL   RDW 27.7 (H) 82.4 - 23.5 %   Platelets 187 150 - 400 K/uL    Comment: REPEATED TO VERIFY   nRBC 0.0 0.0 - 0.2 %    Comment: Performed at [facility], [address], [place], [place] [address]    DG Chest 2  View Result Date: 02/27/2024 CLINICAL DATA:  Shortness of breath EXAM: CHEST - 2 VIEW COMPARISON:  02/01/2024 FINDINGS: Mild cardiomegaly. No acute airspace disease, pleural effusion or pneumothorax. IMPRESSION: No active cardiopulmonary disease. Mild cardiomegaly. Electronically Signed   By: Esmeralda Hedge M.D.   On: 02/27/2024 19:04    ROS: all other systems reviewed and are negative except as per HPI Blood pressure (!) 205/95, pulse 91, temperature 98.4 F (36.9 C), temperature source Oral, resp. rate 19, height 5' 5 (1.651 m), weight 59 kg, last menstrual period 10/11/2019, SpO2 98%. GEN NAD HEENT eomi perrl NECK no JVD PULM clear CV RRR ABD soft EXT no LE edema NEURO AAO x 3 nonfocal SKIN no rashes ACCESS  L AVF + T/B  Assessment/Plan: 1 hyperkalemia: d/t missed HD.  Orders in for tonight. Temporizing with lokelma  and bicarb 2 ESRD:  MWF at [facility].  HD tonight 3 Hypertension:  expect to improve somewhat with UF, can take PO meds after HD 4. Anemia of ESRD:  Hbg OK 5. Metabolic Bone Disease: reports diarrhea associated with renvela .  Discussed that she may benefit from another binder 6.  Dispo: can d/c after HD completed  Stacey Lucas 02/27/2024, 8:02 PM

## 2024-02-28 DIAGNOSIS — E875 Hyperkalemia: Secondary | ICD-10-CM | POA: Diagnosis not present

## 2024-02-28 NOTE — ED Notes (Signed)
 Spoke to dialysis nurse. Consent signed for procedure.

## 2024-02-28 NOTE — ED Notes (Signed)
 Pt to dialysis.

## 2024-02-28 NOTE — Progress Notes (Signed)
 Received patient in bed to unit.  Alert and oriented.  Informed consent signed and in chart.   TX duration:3.5 hours  Patient tolerated well.  Transported back to the room  Alert, without acute distress.  Hand-off given to patient's nurse.   Access used: Left AV Fistula Access issues: none  Total UF removed: 3.5L Medication(s) given: none   02/28/24 1011  Vitals  BP (!) 172/97  Pulse Rate 89  Resp 17  Oxygen Therapy  SpO2 100 %  O2 Device Room Air  During Treatment Monitoring  Dialysate Potassium Concentration 2  Dialysate Calcium  Concentration 2.5  Duration of HD Treatment -hour(s) 3.5 hour(s)  HD Safety Checks Performed Yes  Intra-Hemodialysis Comments Tolerated well;Tx completed  Dialysis Fluid Bolus Normal Saline  Bolus Amount (mL) 300 mL  Post Treatment  Dialyzer Clearance Lightly streaked  Liters Processed 84  Fluid Removed (mL) 3500 mL  Tolerated HD Treatment Yes  Fistula / Graft Left Upper arm Arteriovenous fistula  Placement Date/Time: 02/28/24 0630   Orientation: Left  Access Location: Upper arm  Access Type: Arteriovenous fistula  Status Deaccessed     Camellia Brasil LPN [facility]

## 2024-02-28 NOTE — ED Provider Notes (Signed)
 Care of patient assumed from Dr. Neysa.  This patient presented to the ED yesterday for shortness of breath following recent missed sessions of dialysis.  She underwent dialysis this morning.   Physical Exam  BP (!) 181/85 (BP Location: Right Arm)   Pulse 86   Temp 98.2 F (36.8 C) (Oral)   Resp 18   Ht 5' 5 (1.651 m)   Wt 59 kg   LMP 10/11/2019   SpO2 100%   BMI 21.63 kg/m   Physical Exam Vitals and nursing note reviewed.  Constitutional:      General: She is not in acute distress.    Appearance: Normal appearance. She is well-developed. She is not ill-appearing, toxic-appearing or diaphoretic.  HENT:     Head: Normocephalic and atraumatic.     Right Ear: External ear normal.     Left Ear: External ear normal.     Nose: Nose normal.     Mouth/Throat:     Mouth: Mucous membranes are moist.   Eyes:     Extraocular Movements: Extraocular movements intact.     Conjunctiva/sclera: Conjunctivae normal.    Cardiovascular:     Rate and Rhythm: Normal rate and regular rhythm.     Heart sounds: No murmur heard. Pulmonary:     Effort: Pulmonary effort is normal. No respiratory distress.     Breath sounds: Normal breath sounds. No wheezing, rhonchi or rales.  Abdominal:     General: There is no distension.     Palpations: Abdomen is soft.     Tenderness: There is no abdominal tenderness.   Musculoskeletal:        General: Normal range of motion.     Cervical back: Normal range of motion and neck supple.   Skin:    General: Skin is warm and dry.     Coloration: Skin is not jaundiced or pale.   Neurological:     General: No focal deficit present.     Mental Status: She is alert and oriented to person, place, and time.   Psychiatric:        Mood and Affect: Mood normal.        Behavior: Behavior normal.     Procedures  Procedures  ED Course / MDM   Clinical Course as of 02/28/24 1140  Fri Feb 27, 2024  1826 Spoke with Almarie bonine with nephro. Will plan for  dialysis tonight after a few in front of her.  [TY]    Clinical Course User Index [TY] Neysa Caron PARAS, DO   Medical Decision Making Amount and/or Complexity of Data Reviewed Labs: ordered. Radiology: ordered.  Risk Prescription drug management.   Patient with recent missed sessions of dialysis causing her some shortness of breath, presented to the ED yesterday.  She did not undergo dialysis this morning.  On return to the ED, patient states that shortness of breath has resolved.  On exam, her breathing is unlabored.  Lungs are clear to auscultation.  Her blood pressure remains elevated but is improved from yesterday.  She states that she has her prescribed blood pressure medications to take at home.  She was discharged in stable condition.       Melvenia Motto, MD 02/28/24 [phone]

## 2024-03-02 ENCOUNTER — Ambulatory Visit: Admitting: Podiatry

## 2024-03-02 LAB — HEPATITIS B SURFACE ANTIBODY, QUANTITATIVE: Hep B S AB Quant (Post): 50.2 m[IU]/mL

## 2024-03-08 ENCOUNTER — Other Ambulatory Visit (HOSPITAL_COMMUNITY): Payer: Self-pay

## 2024-03-15 ENCOUNTER — Ambulatory Visit: Attending: Family Medicine | Admitting: Physical Therapy

## 2024-03-15 ENCOUNTER — Telehealth: Payer: Self-pay | Admitting: Family Medicine

## 2024-03-15 NOTE — Telephone Encounter (Signed)
 Contacted pt left vm to confirmed appt

## 2024-03-16 ENCOUNTER — Encounter: Admitting: Family Medicine

## 2024-03-24 ENCOUNTER — Emergency Department (HOSPITAL_COMMUNITY)
Admission: EM | Admit: 2024-03-24 | Discharge: 2024-03-24 | Disposition: A | Discharge status: Home / Self Care | Attending: Emergency Medicine | Admitting: Emergency Medicine

## 2024-03-24 ENCOUNTER — Encounter (HOSPITAL_COMMUNITY): Payer: Self-pay | Admitting: Emergency Medicine

## 2024-03-24 ENCOUNTER — Other Ambulatory Visit: Payer: Self-pay

## 2024-03-24 ENCOUNTER — Emergency Department (HOSPITAL_COMMUNITY)

## 2024-03-24 DIAGNOSIS — J810 Acute pulmonary edema: Secondary | ICD-10-CM | POA: Insufficient documentation

## 2024-03-24 DIAGNOSIS — Z992 Dependence on renal dialysis: Secondary | ICD-10-CM | POA: Insufficient documentation

## 2024-03-24 DIAGNOSIS — Z7901 Long term (current) use of anticoagulants: Secondary | ICD-10-CM | POA: Diagnosis not present

## 2024-03-24 DIAGNOSIS — N186 End stage renal disease: Secondary | ICD-10-CM | POA: Diagnosis not present

## 2024-03-24 DIAGNOSIS — E1122 Type 2 diabetes mellitus with diabetic chronic kidney disease: Secondary | ICD-10-CM | POA: Diagnosis not present

## 2024-03-24 DIAGNOSIS — R079 Chest pain, unspecified: Secondary | ICD-10-CM

## 2024-03-24 DIAGNOSIS — E875 Hyperkalemia: Secondary | ICD-10-CM | POA: Diagnosis not present

## 2024-03-24 DIAGNOSIS — R0602 Shortness of breath: Secondary | ICD-10-CM

## 2024-03-24 LAB — I-STAT CHEM 8, ED
BUN: 82 mg/dL — ABNORMAL HIGH (ref 6–20)
Calcium, Ion: 0.91 mmol/L — ABNORMAL LOW (ref 1.15–1.40)
Chloride: 109 mmol/L (ref 98–111)
Creatinine, Ser: 13.8 mg/dL — ABNORMAL HIGH (ref 0.44–1.00)
Glucose, Bld: 105 mg/dL — ABNORMAL HIGH (ref 70–99)
HCT: 37 % (ref 36.0–46.0)
Hemoglobin: 12.6 g/dL (ref 12.0–15.0)
Potassium: 5.4 mmol/L — ABNORMAL HIGH (ref 3.5–5.1)
Sodium: 139 mmol/L (ref 135–145)
TCO2: 21 mmol/L — ABNORMAL LOW (ref 22–32)

## 2024-03-24 LAB — TROPONIN I (HIGH SENSITIVITY)
Troponin I (High Sensitivity): 53 ng/L — ABNORMAL HIGH (ref <18)
Troponin I (High Sensitivity): 59 ng/L — ABNORMAL HIGH (ref <18)

## 2024-03-24 LAB — CBC WITH DIFFERENTIAL/PLATELET
Abs Immature Granulocytes: 0.03 K/uL (ref 0.00–0.07)
Basophils Absolute: 0.1 K/uL (ref 0.0–0.1)
Basophils Relative: 1 %
Eosinophils Absolute: 0.6 K/uL — ABNORMAL HIGH (ref 0.0–0.5)
Eosinophils Relative: 6 %
HCT: 36.1 % (ref 36.0–46.0)
Hemoglobin: 12 g/dL (ref 12.0–15.0)
Immature Granulocytes: 0 %
Lymphocytes Relative: 24 %
Lymphs Abs: 2.2 K/uL (ref 0.7–4.0)
MCH: 25.6 pg — ABNORMAL LOW (ref 26.0–34.0)
MCHC: 33.2 g/dL (ref 30.0–36.0)
MCV: 77 fL — ABNORMAL LOW (ref 80.0–100.0)
Monocytes Absolute: 0.5 K/uL (ref 0.1–1.0)
Monocytes Relative: 5 %
Neutro Abs: 6 K/uL (ref 1.7–7.7)
Neutrophils Relative %: 64 %
Platelets: 171 K/uL (ref 150–400)
RBC: 4.69 MIL/uL (ref 3.87–5.11)
RDW: 18.4 % — ABNORMAL HIGH (ref 11.5–15.5)
WBC: 9.4 K/uL (ref 4.0–10.5)
nRBC: 0 % (ref 0.0–0.2)

## 2024-03-24 LAB — BASIC METABOLIC PANEL WITH GFR
Anion gap: 21 — ABNORMAL HIGH (ref 5–15)
BUN: 95 mg/dL — ABNORMAL HIGH (ref 6–20)
CO2: 20 mmol/L — ABNORMAL LOW (ref 22–32)
Calcium: 8.4 mg/dL — ABNORMAL LOW (ref 8.9–10.3)
Chloride: 100 mmol/L (ref 98–111)
Creatinine, Ser: 12.59 mg/dL — ABNORMAL HIGH (ref 0.44–1.00)
GFR, Estimated: 3 mL/min — ABNORMAL LOW (ref >60)
Glucose, Bld: 109 mg/dL — ABNORMAL HIGH (ref 70–99)
Potassium: 5.4 mmol/L — ABNORMAL HIGH (ref 3.5–5.1)
Sodium: 141 mmol/L (ref 135–145)

## 2024-03-24 LAB — BRAIN NATRIURETIC PEPTIDE: B Natriuretic Peptide: 1350.5 pg/mL — ABNORMAL HIGH (ref 0.0–100.0)

## 2024-03-24 LAB — HEPATITIS B SURFACE ANTIGEN: Hepatitis B Surface Ag: NONREACTIVE

## 2024-03-24 MED ORDER — ONDANSETRON HCL 4 MG/2ML IJ SOLN
4.0000 mg | Freq: Once | INTRAMUSCULAR | Status: AC
  Administered 2024-03-24: 4 mg via INTRAVENOUS
  Filled 2024-03-24: qty 2

## 2024-03-24 MED ORDER — HYDRALAZINE HCL 20 MG/ML IJ SOLN: 10.0000 mg | Freq: Once | INTRAMUSCULAR | Status: DC

## 2024-03-24 MED ORDER — SODIUM ZIRCONIUM CYCLOSILICATE 10 G PO PACK
10.0000 g | PACK | Freq: Once | ORAL | Status: AC
  Administered 2024-03-24: 10 g via ORAL
  Filled 2024-03-24: qty 1

## 2024-03-24 MED ORDER — NITROGLYCERIN IN D5W 200-5 MCG/ML-% IV SOLN
0.0000 ug/min | INTRAVENOUS | Status: DC
  Administered 2024-03-24: 5 ug/min via INTRAVENOUS
  Filled 2024-03-24: qty 250

## 2024-03-24 MED ORDER — CHLORHEXIDINE GLUCONATE CLOTH 2 % EX PADS: 6.0000 | MEDICATED_PAD | Freq: Every day | CUTANEOUS | Status: DC

## 2024-03-24 MED ORDER — FENTANYL CITRATE PF 50 MCG/ML IJ SOSY
50.0000 ug | PREFILLED_SYRINGE | Freq: Once | INTRAMUSCULAR | Status: AC
  Administered 2024-03-24: 50 ug via INTRAVENOUS
  Filled 2024-03-24: qty 1

## 2024-03-24 MED ORDER — LABETALOL HCL 5 MG/ML IV SOLN
20.0000 mg | Freq: Once | INTRAVENOUS | Status: AC
  Administered 2024-03-24: 20 mg via INTRAVENOUS
  Filled 2024-03-24: qty 4

## 2024-03-24 NOTE — ED Notes (Signed)
 Entered room to find patient lying in bed, with no O2 on. Patient reports no distress, and O2 levels okay. Patient left on room air.

## 2024-03-24 NOTE — ED Triage Notes (Signed)
 Pt to ED via [facility] from home c/o SOB that started yesterday.  Pt is MWF dialysis and transport didn't pick her up Monday and pt hoped to make it to Wednesday.  SOB started Tuesday without pain.  Left arm access.

## 2024-03-24 NOTE — ED Notes (Signed)
Pt discharged. Pt given discharge papers and papers explained. Pt in NAD at this time

## 2024-03-24 NOTE — ED Provider Notes (Signed)
 [place] EMERGENCY DEPARTMENT AT [facility] Provider Note   CSN: [id number] Arrival date & time: 03/24/24  0341     Patient presents with: Shortness of Breath   Stacey Lucas is a 48 y.o. female.  Patient with past medical history significant for type II DM, GERD, ESRD on hemodialysis Monday Wednesday Friday, atrial fibrillation presents the emergency department via EMS complaining of shortness of breath.  The patient states that her transportation did not arrive on Monday to take her to her scheduled hemodialysis appointment as she missed hemodialysis.  She states she did fine throughout the day on Monday but during the afternoon on Tuesday she began to feel short of breath.  EMS reports that the patient had oxygen saturations in the 80% range upon their arrival and placed her on 10 L/min oxygen via facemask.  Upon arrival on 10 L of oxygen patient was satting 100% and oxygen was titrated down to 4 L/min nasal cannula.  Patient denies nausea, vomiting, abdominal pain, chest pain, headache.    Shortness of Breath      Prior to Admission medications   Medication Sig Start Date End Date Taking? Authorizing Provider  amLODipine  (NORVASC ) 10 MG tablet Take 1 tablet (10 mg total) by mouth daily. 11/12/23   Newlin, Enobong, MD  apixaban  (ELIQUIS ) 2.5 MG TABS tablet Take 1 tablet (2.5 mg total) by mouth 2 (two) times daily. 11/12/23   Newlin, Enobong, MD  atorvastatin  (LIPITOR ) 40 MG tablet Take 1 tablet (40 mg total) by mouth daily. 07/08/23   Krishnan, Gokul, MD  buPROPion  (WELLBUTRIN  XL) 150 MG 24 hr tablet Take 1 tablet (150 mg total) by mouth daily. 11/12/23   Newlin, Enobong, MD  carvedilol  (COREG ) 6.25 MG tablet Take 1 tablet (6.25 mg total) by mouth 2 (two) times daily with a meal. 11/12/23   Delbert Clam, MD  DULoxetine  (CYMBALTA ) 30 MG capsule Take 1 capsule (30 mg total) by mouth daily for chronic pain 11/12/23   Newlin, Enobong, MD  hydrALAZINE  (APRESOLINE ) 25 MG tablet Take 1  tablet (25 mg total) by mouth 2 (two) times daily. 02/17/24   Newlin, Enobong, MD  hydrOXYzine  (ATARAX ) 25 MG tablet Take 1 tablet (25 mg total) by mouth at bedtime as needed. 11/12/23   Newlin, Enobong, MD  Misc. Devices MISC Shower chair, rolling walker with seat.  Diagnosis end-stage renal disease 02/17/24   Delbert Clam, MD  Misc. Devices MISC adult pull ups (medium), bed pads (large), body wipes (large) .  Diagnosis end-stage renal disease on hemodialysis 02/17/24   Delbert Clam, MD  nicotine  polacrilex (NICORETTE) 4 MG gum Take by mouth. 10/31/23   [provider]  ondansetron  (ZOFRAN -ODT) 4 MG disintegrating tablet Take 1 tablet (4 mg total) by mouth every 8 (eight) hours as needed for nausea or vomiting. 02/01/24   Raford Lenis, MD  sevelamer  carbonate (RENVELA ) 800 MG tablet Take 800 mg by mouth 3 (three) times daily with meals. 05/15/23 05/14/24  [provider]    Allergies: Neurontin  [gabapentin ]    Review of Systems  Respiratory:  Positive for shortness of breath.     Updated Vital Signs BP (!) 194/97   Pulse (!) 112   Temp 98.2 F (36.8 C) (Axillary)   Resp (!) 25   Ht 5' 5 (1.651 m)   Wt 61.2 kg   LMP 10/11/2019   SpO2 97%   BMI 22.47 kg/m   Physical Exam Vitals and nursing note reviewed.  Constitutional:  General: She is not in acute distress.    Appearance: She is well-developed.  HENT:     Head: Normocephalic and atraumatic.  Eyes:     Conjunctiva/sclera: Conjunctivae normal.  Cardiovascular:     Rate and Rhythm: Normal rate and regular rhythm.     Heart sounds: No murmur heard. Pulmonary:     Effort: Pulmonary effort is normal. No respiratory distress.     Breath sounds: Examination of the right-lower field reveals rales. Examination of the left-lower field reveals rales. Rales present.  Abdominal:     Palpations: Abdomen is soft.     Tenderness: There is no abdominal tenderness.  Musculoskeletal:        General: No swelling.      Cervical back: Neck supple.  Skin:    General: Skin is warm and dry.     Capillary Refill: Capillary refill takes less than 2 seconds.  Neurological:     Mental Status: She is alert.  Psychiatric:        Mood and Affect: Mood normal.     (all labs ordered are listed, but only abnormal results are displayed) Labs Reviewed  CBC WITH DIFFERENTIAL/PLATELET - Abnormal; Notable for the following components:      Result Value   MCV 77.0 (*)    MCH 25.6 (*)    RDW 18.4 (*)    Eosinophils Absolute 0.6 (*)    All other components within normal limits  BRAIN NATRIURETIC PEPTIDE - Abnormal; Notable for the following components:   B Natriuretic Peptide 1,350.5 (*)    All other components within normal limits  I-STAT CHEM 8, ED - Abnormal; Notable for the following components:   Potassium 5.4 (*)    BUN 82 (*)    Creatinine, Ser 13.80 (*)    Glucose, Bld 105 (*)    Calcium , Ion 0.91 (*)    TCO2 21 (*)    All other components within normal limits  BASIC METABOLIC PANEL WITH GFR    EKG: EKG Interpretation Date/Time:  Wednesday March 24 2024 03:46:52 EDT Ventricular Rate:  105 PR Interval:  171 QRS Duration:  94 QT Interval:  385 QTC Calculation: 509 R Axis:   -36  Text Interpretation: Sinus tachycardia LVH with secondary repolarization abnormality Borderline prolonged QT interval No significant change was found Confirmed by Carita Senior [phone]) on 03/24/2024 3:50:13 AM  Radiology: [facility] Chest Portable 1 View Result Date: 03/24/2024 CLINICAL DATA:  48 year old female with shortness of breath. EXAM: PORTABLE CHEST 1 VIEW COMPARISON:  Chest radiograph 02/27/2024 and earlier. FINDINGS: Portable AP semi upright view at 0400 hours. Chronic cardiomegaly. Stable mediastinal contours. Slightly lower lung volumes. Diffuse, symmetric increased pulmonary interstitium and slight increased density along the right minor fissure also. No pneumothorax. No layering pleural fluid. No air bronchograms.  Paucity of bowel gas in the upper abdomen. No acute osseous abnormality identified. IMPRESSION: Chronic cardiomegaly with acute pulmonary edema. Viral/atypical respiratory infection felt less likely. Electronically Signed   By: VEAR Hurst M.D.   On: 03/24/2024 04:24     .Critical Care  Performed by: Logan Ubaldo NOVAK, PA-C Authorized by: Logan Ubaldo NOVAK, PA-C   Critical care provider statement:    Critical care time (minutes):  30   Critical care time was exclusive of:  Separately billable procedures and treating other patients   Critical care was necessary to treat or prevent imminent or life-threatening deterioration of the following conditions:  Renal failure   Critical care was time spent personally  by me on the following activities:  Development of treatment plan with patient or surrogate, discussions with consultants, evaluation of patient's response to treatment, examination of patient, ordering and review of laboratory studies, ordering and review of radiographic studies, ordering and performing treatments and interventions, pulse oximetry, re-evaluation of patient's condition and review of old charts    Medications Ordered in the ED  nitroGLYCERIN  50 mg in dextrose  5 % 250 mL (0.2 mg/mL) infusion (10 mcg/min Intravenous Rate/Dose Change 03/24/24 0505)                                    Medical Decision Making Amount and/or Complexity of Data Reviewed Labs: ordered. Radiology: ordered.  Risk Prescription drug management.   This patient presents to the ED for concern of shortness of breath, this involves an extensive number of treatment options, and is a complaint that carries with it a high risk of complications and morbidity.  The differential diagnosis includes pulmonary edema, pneumonia, pulmonary embolism, others   Co morbidities / Chronic conditions that complicate the patient evaluation  ESRD on Monday Wednesday Friday hemodialysis with a missed dialysis  session   Additional history obtained:  Additional history obtained from EMR   Lab Tests:  I Ordered, and personally interpreted labs.  The pertinent results include: BNP 1350.5, creatinine 12.59, potassium 5.4   Imaging Studies ordered:  I ordered imaging studies including chest x-ray I independently visualized and interpreted imaging which showed  Chronic cardiomegaly with acute pulmonary edema. Viral/atypical  respiratory infection felt less likely.   I agree with the radiologist interpretation   Cardiac Monitoring: / EKG:  The patient was maintained on a cardiac monitor.  I personally viewed and interpreted the cardiac monitored which showed an underlying rhythm of: Sinus tachycardia   Problem List / ED Course / Critical interventions / Medication management   I ordered medication including nitroglycerin  Reevaluation of the patient after these medicines showed that the patient improved I have reviewed the patients home medicines and have made adjustments as needed   Consultations Obtained:  I requested consultation with the nephrologist,   Social Determinants of Health:  Patient is a daily smoker   Test / Admission - Considered:  Patient with acute pulmonary edema in the setting of missed hemodialysis.  Patient significantly hypertensive and fluid overloaded.  Patient needs hemodialysis in the hospital setting as she is not stable for outpatient discharge at this time. Patient care transferred to Ariel Zelaya, PA-C at shift handoff. Anticipate hemodialysis session followed by re-evaluation in the ED with likely discharge home.       Final diagnoses:  None    ED Discharge Orders     None          Logan Ubaldo KATHEE DEVONNA 03/24/24 9349    Carita Senior, MD 03/24/24 [phone]

## 2024-03-24 NOTE — ED Notes (Signed)
 Called dialysis regarding this patient to see if they could give an estimated time for her to go up for treatment, they advised it should be soon. I advised that she was on nitroglycerin  drip for BP, but had been at same dose for a bit, not currently being titrated.

## 2024-03-24 NOTE — ED Provider Notes (Signed)
  Physical Exam  BP (!) 164/80 (BP Location: Right Arm)   Pulse 95   Temp 98.1 F (36.7 C) (Oral)   Resp 15   Ht 5' 5 (1.651 m)   Wt 61.2 kg   LMP 10/11/2019   SpO2 98%   BMI 22.45 kg/m   Physical Exam  Procedures  Procedures  ED Course / MDM    Medical Decision Making Amount and/or Complexity of Data Reviewed Labs: ordered. Radiology: ordered.  Risk Prescription drug management.   Patient was assessed status post dialysis.  She states that her shortness of breath has now resolved and she is no longer on oxygen however she is endorsing a chest heaviness substernally in the middle of her chest.  No ripping or tearing component, no radiation to the back.  She is hypertensive BP 174/89 and borderline tachycardic heart rate 98.  Will administer 20 mg of IV labetalol , obtain EKG and check cardiac enzymes.  Fentanyl  administered for pain control.  EKG:    T wave inversions noted, similar to prior EKG  Labs: Cardiac troponin 53, patient has a baseline elevated troponin, downtrending from previous measurements.  Patient administered fentanyl , labetalol  for blood pressure control as well as Zofran .  Repeat troponin flat at 59.  On reassessment, the patient's chest pain had completely resolved, she is requesting discharge, I recommended that she continue to follow-up for dialysis.  Patient stable for discharge at this time.       Jerrol Agent, MD 03/24/24 2248

## 2024-03-24 NOTE — Progress Notes (Signed)
 Heart Failure Navigator Progress Note  Assessed for Heart & Vascular TOC clinic readiness.  Patient does not meet criteria due to ESRD on hemodialysis. No HF TOC.   Navigator will sign off at this time.   Randie Bustle, BSN, Scientist, clinical (histocompatibility and immunogenetics) Only

## 2024-03-24 NOTE — ED Notes (Signed)
 Report given to dialysis staff, nitroglycerin  to be d/c prior to going to HD.

## 2024-03-24 NOTE — ED Notes (Signed)
 Pt placed on [place] by EDP to trial.  Pt work of breathing has improved, oxygen sat 100%.

## 2024-03-26 LAB — HEPATITIS B SURFACE ANTIBODY, QUANTITATIVE: Hep B S AB Quant (Post): 50 m[IU]/mL

## 2024-04-06 ENCOUNTER — Ambulatory Visit: Admitting: Physical Therapy

## 2024-04-16 ENCOUNTER — Emergency Department (HOSPITAL_COMMUNITY)
Admission: EM | Admit: 2024-04-16 | Discharge: 2024-04-16 | Disposition: A | Discharge status: Home / Self Care | Attending: Emergency Medicine | Admitting: Emergency Medicine

## 2024-04-16 ENCOUNTER — Emergency Department (HOSPITAL_COMMUNITY)

## 2024-04-16 ENCOUNTER — Other Ambulatory Visit: Payer: Self-pay

## 2024-04-16 ENCOUNTER — Encounter (HOSPITAL_COMMUNITY): Payer: Self-pay

## 2024-04-16 DIAGNOSIS — N186 End stage renal disease: Secondary | ICD-10-CM | POA: Diagnosis not present

## 2024-04-16 DIAGNOSIS — E1122 Type 2 diabetes mellitus with diabetic chronic kidney disease: Secondary | ICD-10-CM | POA: Diagnosis not present

## 2024-04-16 DIAGNOSIS — I120 Hypertensive chronic kidney disease with stage 5 chronic kidney disease or end stage renal disease: Secondary | ICD-10-CM | POA: Insufficient documentation

## 2024-04-16 DIAGNOSIS — Z992 Dependence on renal dialysis: Secondary | ICD-10-CM | POA: Insufficient documentation

## 2024-04-16 DIAGNOSIS — R5381 Other malaise: Secondary | ICD-10-CM | POA: Diagnosis present

## 2024-04-16 LAB — COMPREHENSIVE METABOLIC PANEL WITH GFR
ALT: 10 U/L (ref 0–44)
AST: 18 U/L (ref 15–41)
Albumin: 4.1 g/dL (ref 3.5–5.0)
Alkaline Phosphatase: 83 U/L (ref 38–126)
Anion gap: 17 — ABNORMAL HIGH (ref 5–15)
BUN: 39 mg/dL — ABNORMAL HIGH (ref 6–20)
CO2: 26 mmol/L (ref 22–32)
Calcium: 8.9 mg/dL (ref 8.9–10.3)
Chloride: 95 mmol/L — ABNORMAL LOW (ref 98–111)
Creatinine, Ser: 8.92 mg/dL — ABNORMAL HIGH (ref 0.44–1.00)
GFR, Estimated: 5 mL/min — ABNORMAL LOW (ref >60)
Glucose, Bld: 109 mg/dL — ABNORMAL HIGH (ref 70–99)
Potassium: 5.2 mmol/L — ABNORMAL HIGH (ref 3.5–5.1)
Sodium: 138 mmol/L (ref 135–145)
Total Bilirubin: 1.1 mg/dL (ref 0.0–1.2)
Total Protein: 8.1 g/dL (ref 6.5–8.1)

## 2024-04-16 LAB — CBC WITH DIFFERENTIAL/PLATELET
Abs Immature Granulocytes: 0.01 K/uL (ref 0.00–0.07)
Basophils Absolute: 0.1 K/uL (ref 0.0–0.1)
Basophils Relative: 1 %
Eosinophils Absolute: 0.5 K/uL (ref 0.0–0.5)
Eosinophils Relative: 8 %
HCT: 38.2 % (ref 36.0–46.0)
Hemoglobin: 12.3 g/dL (ref 12.0–15.0)
Immature Granulocytes: 0 %
Lymphocytes Relative: 39 %
Lymphs Abs: 2.4 K/uL (ref 0.7–4.0)
MCH: 25.3 pg — ABNORMAL LOW (ref 26.0–34.0)
MCHC: 32.2 g/dL (ref 30.0–36.0)
MCV: 78.6 fL — ABNORMAL LOW (ref 80.0–100.0)
Monocytes Absolute: 0.7 K/uL (ref 0.1–1.0)
Monocytes Relative: 11 %
Neutro Abs: 2.5 K/uL (ref 1.7–7.7)
Neutrophils Relative %: 41 %
Platelets: 179 K/uL (ref 150–400)
RBC: 4.86 MIL/uL (ref 3.87–5.11)
RDW: 18.4 % — ABNORMAL HIGH (ref 11.5–15.5)
WBC: 6.2 K/uL (ref 4.0–10.5)
nRBC: 0 % (ref 0.0–0.2)

## 2024-04-16 LAB — HEPATITIS B SURFACE ANTIGEN: Hepatitis B Surface Ag: NONREACTIVE

## 2024-04-16 NOTE — ED Notes (Signed)
 Phlebotomy to get labs

## 2024-04-16 NOTE — ED Provider Notes (Signed)
 Patient received at handoff  48 year old female with history of ESRD on dialysis Monday Wednesday Friday.  Presented to the emergency department stating that she had missed her dialysis session.  Patient states that during previous missed dialysis sessions she has experienced pulmonary edema however does not have any complaints at this time and wanted to be checked out.  Plan at handoff: - Follow-up laboratory studies - If laboratory studies are within normal limits and no evidence for emergent dialysis necessary, patient may be discharged with plans to call dialysis center to reschedule appointment or proceed with dialysis on Monday  Physical Exam  BP (!) 148/67 (BP Location: Right Arm)   Pulse 85   Temp 97.6 F (36.4 C) (Oral)   Resp 17   LMP 10/11/2019   SpO2 100%   Physical Exam Vitals reviewed.  Constitutional:      General: She is not in acute distress.    Appearance: She is not ill-appearing.  Cardiovascular:     Rate and Rhythm: Normal rate and regular rhythm.  Pulmonary:     Effort: Pulmonary effort is normal. No tachypnea.     Breath sounds: Normal breath sounds.  Musculoskeletal:     Cervical back: Full passive range of motion without pain.     Comments: Spontaneous movement of bilateral upper and lower extremities  Neurological:     Mental Status: She is alert.     Gait: Gait is intact.  Psychiatric:        Behavior: Behavior is cooperative.     Procedures  Procedures  ED Course / MDM   Clinical Course as of 04/16/24 1509  Fri Apr 16, 2024  1445 Dialysis MWF. Asx however gets pulm edema when she doesn't go. If labs neg can leave and go Monday [AG]  1455 DG Chest 1 View No pulmonary edema, pneumothorax, pneumonia or widened mediastinum [RC]    Clinical Course User Index [AG] Nada Chroman, DO [RC] Sharyne Darina RAMAN, MD   Medical Decision Making Amount and/or Complexity of Data Reviewed Labs: ordered. Radiology: ordered. Decision-making details  documented in ED Course.   Upon initial evaluation of the patient she is alert and oriented with ability to follow commands and answer questions appropriately, is hemodynamically stable, afebrile and not in acute distress.  Patient's laboratory studies reviewed without need for emergent dialysis.  Patient has no evidence of infection, anemia, significant hyper kalemia or electrolyte abnormalities.  At this time believe patient is safe to be discharged with strict return precautions.  At the time of discharge patient agreed with and understood plan of care  Chroman Nada DO Emergency Medicine PGY2        Nada Chroman, DO 04/16/24 1721    Cottie Donnice PARAS, MD 04/17/24 1434

## 2024-04-16 NOTE — Discharge Instructions (Addendum)
 Thank you for allowing us  to care for you today.  We performed laboratory studies which were improved compared to your previous studies.  At this time we believe you are safe to be discharged.  Please be sure to call your dialysis center to see if you are able to complete your dialysis session.  If you begin experiencing chest pain, shortness of breath, passout or believe you need emergent medical care please return to the emergency department.  We hope you feel better soon

## 2024-04-16 NOTE — ED Triage Notes (Signed)
 Patient bib EMS from home with complaints of feeling unwell. Patient missed a treatment yesterday. Patient states that when she misses dialysis that she gets fluid in her lungs and has trouble breathing. At this time patient denies any respiratory symptoms.

## 2024-04-16 NOTE — ED Provider Notes (Signed)
 [place] EMERGENCY DEPARTMENT AT [facility] Provider Note  MDM   HPI/ROS:  Stacey Lucas is a 48 y.o. female with a medical history as below who presents with concerns for missed dialysis.  Patient reports that she is dialyzed Monday Wednesday Friday and did not make it to dialysis this morning.  She states she feels in her normal state of health at this time but usually gets fluid on her lungs when she misses which prompted her to come in to the emergency department.  She denies chest pain, shortness of breath, orthopnea, exertional dyspnea that is worse than prior.  Also denies any leg swelling, fevers chills nausea or vomiting.  Physical exam is notable for: - Generally well-appearing, saturating well on room air without increased work of breathing or supplemental O2 requirement.  No rales on exam  On my initial evaluation, patient is:  -Vital signs stable. Patient afebrile, hemodynamically stable, and non-toxic appearing. -Additional history obtained from chart review   Differentials include missed dialysis, electrolyte derangements, fluid overload, pulmonary edema.    Physical exam reassuring as patient is oxygenating well without increased work or supplemental O2.  She has no rales on exam.  Given her missed dialysis do believe it is reasonable to obtain labs and chest x-ray.  Her x-ray is reassuring as below however labs and EKG are pending.  Discussed with the patient the needs for possible emergent dialysis however if she does not meet these needs likely be appropriate for discharge with close follow-up and strict return precautions if her breathing were to worsen like or other symptoms arise.  Discussed these return precautions with the patient prior to signout.  Handoff given to the oncoming team in a question due to the best my ability.  Interpretations, interventions, and the patient's course of care are documented below.    Clinical Course as of 04/16/24 1455  Fri Apr 16, 2024     1455 DG Chest 1 View No pulmonary edema, pneumothorax, pneumonia or widened mediastinum [RC]    Clinical Course User Index [RC] Sharyne Darina RAMAN, MD      Disposition:  Pending results of labs and EKG.  Clinical Impression:  1. ESRD (end stage renal disease) (HCC)     The plan for this patient was discussed with Dr. Yolande, who voiced agreement and who oversaw evaluation and treatment of this patient.   Clinical Complexity A medically appropriate history, review of systems, and physical exam was performed.  My independent interpretations of EKG, labs, and radiology are documented in the ED course above.   If decision rules were used in this patient's evaluation, they are listed below.   Click here for ABCD2, HEART and other calculatorsREFRESH Note before signing   Patient's presentation is most consistent with exacerbation of chronic illness.  Medical Decision Making Amount and/or Complexity of Data Reviewed Labs: ordered. Radiology: ordered.    HPI/ROS      See MDM section for pertinent HPI and ROS. A complete ROS was performed with pertinent positives/negatives noted above.   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  12/29/2021   Anxiety and depression    Atrial fibrillation (HCC)    Cerebral thrombosis with cerebral infarction 05/24/2021   Cocaine abuse (HCC)    Depression with suicidal ideation    [facility] admission 04/2018   Diabetes mellitus without complication (HCC)    Type II  End stage kidney disease (HCC)    GERD (gastroesophageal reflux disease)    Hyperlipidemia    Hypertension    Impingement syndrome of right shoulder    Migraine headache    Osteoarthritis of right knee 05/24/2021   Pilonidal abscess 05/2013   Pyelonephritis    Right thyroid nodule    Sciatica     Past Surgical History:  Procedure Laterality Date   AV  FISTULA PLACEMENT Left 03/17/2023   Procedure: LEFT ARM BRACHIOCEPHALIC ARTERIOVENOUS (AV) FISTULA CREATION;  Surgeon: Lanis Fonda BRAVO, MD;  Location: [facility] OR;  Service: Vascular;  Laterality: Left;   BUBBLE STUDY  01/01/2022   Procedure: BUBBLE STUDY;  Surgeon: Alvan Ronal BRAVO, MD;  Location: [facility] ENDOSCOPY;  Service: Cardiovascular;;   IR FLUORO GUIDE CV LINE RIGHT  03/14/2023   IR US  GUIDE VASC ACCESS RIGHT  03/14/2023   TEE WITHOUT CARDIOVERSION N/A 01/01/2022   Procedure: TRANSESOPHAGEAL ECHOCARDIOGRAM (TEE);  Surgeon: Alvan Ronal BRAVO, MD;  Location: [facility] ENDOSCOPY;  Service: Cardiovascular;  Laterality: N/A;   tubal     TUBAL LIGATION        Physical Exam   Vitals:   04/16/24 1358 04/16/24 1359  BP:  (!) 148/67  Pulse:  85  Resp:  17  Temp:  97.6 F (36.4 C)  TempSrc:  Oral  SpO2: 100% 100%    Physical Exam Vitals and nursing note reviewed.  Constitutional:      General: She is not in acute distress.    Appearance: She is well-developed.  HENT:     Head: Normocephalic and atraumatic.  Eyes:     Conjunctiva/sclera: Conjunctivae normal.  Cardiovascular:     Rate and Rhythm: Normal rate and regular rhythm.     Heart sounds: No murmur heard. Pulmonary:     Effort: Pulmonary effort is normal. No respiratory distress.     Breath sounds: Normal breath sounds. No rales.  Abdominal:     Palpations: Abdomen is soft.     Tenderness: There is no abdominal tenderness.  Musculoskeletal:        General: No swelling.     Cervical back: Neck supple.     Right lower leg: No edema.     Left lower leg: No edema.  Skin:    General: Skin is warm and dry.     Capillary Refill: Capillary refill takes less than 2 seconds.  Neurological:     Mental Status: She is alert.  Psychiatric:        Mood and Affect: Mood normal.      Procedures   If procedures were preformed on this patient, they are listed below:  Procedures   @BBSIG @   Please note that this documentation was produced  with the assistance of voice-to-text technology and may contain errors.    Sharyne Darina RAMAN, MD 04/16/24 1501    Yolande Lamar BROCKS, MD 04/23/24 1012

## 2024-04-17 LAB — HEPATITIS B SURFACE ANTIBODY, QUANTITATIVE: Hep B S AB Quant (Post): 48.6 m[IU]/mL

## 2024-05-18 ENCOUNTER — Telehealth: Payer: Self-pay

## 2024-05-18 NOTE — Telephone Encounter (Signed)
 Copied from CRM [id number]. Topic: Clinical - Order For Equipment >> May 18, 2024  2:14 PM Fonda T wrote: Reason for CRM: Received call from patient, requesting a walker to be able to get around and to go to appointment for dialysis three times per week.  Current walker is not working, and needs a replacement as soon as possible.   Patient can be reached at [phone].

## 2024-05-19 NOTE — Telephone Encounter (Signed)
 Attempted to contact patient and VM was left informing patient that she needs a video visit to discuss a new walker.

## 2024-05-20 ENCOUNTER — Encounter (HOSPITAL_COMMUNITY): Payer: Self-pay | Admitting: *Deleted

## 2024-05-20 ENCOUNTER — Other Ambulatory Visit: Payer: Self-pay

## 2024-05-20 ENCOUNTER — Emergency Department (HOSPITAL_COMMUNITY)
Admission: EM | Admit: 2024-05-20 | Discharge: 2024-05-21 | Disposition: A | Discharge status: Home / Self Care | Attending: Emergency Medicine | Admitting: Emergency Medicine

## 2024-05-20 ENCOUNTER — Emergency Department (HOSPITAL_COMMUNITY)

## 2024-05-20 ENCOUNTER — Ambulatory Visit: Admitting: Family Medicine

## 2024-05-20 DIAGNOSIS — N186 End stage renal disease: Secondary | ICD-10-CM | POA: Insufficient documentation

## 2024-05-20 DIAGNOSIS — Z992 Dependence on renal dialysis: Secondary | ICD-10-CM | POA: Diagnosis not present

## 2024-05-20 DIAGNOSIS — I4891 Unspecified atrial fibrillation: Secondary | ICD-10-CM | POA: Diagnosis not present

## 2024-05-20 DIAGNOSIS — Z7901 Long term (current) use of anticoagulants: Secondary | ICD-10-CM | POA: Insufficient documentation

## 2024-05-20 DIAGNOSIS — R079 Chest pain, unspecified: Secondary | ICD-10-CM | POA: Diagnosis present

## 2024-05-20 DIAGNOSIS — E1122 Type 2 diabetes mellitus with diabetic chronic kidney disease: Secondary | ICD-10-CM | POA: Diagnosis not present

## 2024-05-20 LAB — TROPONIN I (HIGH SENSITIVITY): Troponin I (High Sensitivity): 39 ng/L — ABNORMAL HIGH (ref <18)

## 2024-05-20 LAB — BASIC METABOLIC PANEL WITH GFR
Anion gap: 22 — ABNORMAL HIGH (ref 5–15)
BUN: 60 mg/dL — ABNORMAL HIGH (ref 6–20)
CO2: 21 mmol/L — ABNORMAL LOW (ref 22–32)
Calcium: 7.1 mg/dL — ABNORMAL LOW (ref 8.9–10.3)
Chloride: 98 mmol/L (ref 98–111)
Creatinine, Ser: 12.23 mg/dL — ABNORMAL HIGH (ref 0.44–1.00)
GFR, Estimated: 3 mL/min — ABNORMAL LOW (ref >60)
Glucose, Bld: 159 mg/dL — ABNORMAL HIGH (ref 70–99)
Potassium: 4.2 mmol/L (ref 3.5–5.1)
Sodium: 141 mmol/L (ref 135–145)

## 2024-05-20 LAB — CBC
HCT: 35.1 % — ABNORMAL LOW (ref 36.0–46.0)
Hemoglobin: 11.4 g/dL — ABNORMAL LOW (ref 12.0–15.0)
MCH: 26.6 pg (ref 26.0–34.0)
MCHC: 32.5 g/dL (ref 30.0–36.0)
MCV: 81.8 fL (ref 80.0–100.0)
Platelets: 359 K/uL (ref 150–400)
RBC: 4.29 MIL/uL (ref 3.87–5.11)
RDW: 20.5 % — ABNORMAL HIGH (ref 11.5–15.5)
WBC: 7.1 K/uL (ref 4.0–10.5)
nRBC: 0 % (ref 0.0–0.2)

## 2024-05-20 NOTE — ED Triage Notes (Signed)
 Last HD treatment on Monday. Typically a MWF patient. C/o wet cough and chest pain

## 2024-05-20 NOTE — ED Triage Notes (Signed)
 States she hasn't been having dialysis for a week because her walker broke.

## 2024-05-21 DIAGNOSIS — R079 Chest pain, unspecified: Secondary | ICD-10-CM | POA: Diagnosis not present

## 2024-05-21 LAB — TROPONIN I (HIGH SENSITIVITY): Troponin I (High Sensitivity): 36 ng/L — ABNORMAL HIGH (ref <18)

## 2024-05-21 MED ORDER — CHLORHEXIDINE GLUCONATE CLOTH 2 % EX PADS
6.0000 | MEDICATED_PAD | Freq: Every day | CUTANEOUS | Status: DC
Start: 2024-05-21 — End: 2024-05-22

## 2024-05-21 NOTE — ED Provider Notes (Addendum)
 Patient initially seen by PA Groce.  Please see his note.  Patient presented to the ED for evaluation of chest pain and not going to dialysis.  Cardiac workup reassuring.  Troponins are slightly elevated but they are stable.  Presentation not concerning for ACS.  Plan is for dialysis today and anticipate discharge after that   Randol Simmonds, MD 05/21/24 0743  Mae Corean BROCKS, RN  Case Manager [facility] CM/SW   Care Management    Cosign Needed   Date of Service: 05/21/2024  8:26 AM   Cosign Needed           Durable Medical Equipment  (From admission, onward)                 Start     Ordered    05/21/24 0827   For home use only DME 4 wheeled rolling walker with seat  Once       Question:  Patient needs a walker to treat with the following condition  Answer:  Weakness   05/21/24 9173                       Electronically signed by Mae Corean BROCKS, RN at 05/21/2024  8:27 AM   Randol Simmonds, MD 05/21/24 [phone]

## 2024-05-21 NOTE — ED Notes (Signed)
 NP at bedside.

## 2024-05-21 NOTE — Care Management (Addendum)
 Patient states he walker was broken, therefore could not get to the bus to go to dialysis. Inquired with [employer] to see if we can get a replacement or if they need to go out and look at it to fix it. Awaiting reply [phone] Kathline back from [employer], patient can get  a new rollator. Orders in [employer] will deliver to [place]

## 2024-05-21 NOTE — Care Management (Cosign Needed)
    Durable Medical Equipment  (From admission, onward)           Start     Ordered   05/21/24 0827  For home use only DME 4 wheeled rolling walker with seat  Once       Question:  Patient needs a walker to treat with the following condition  Answer:  Weakness   05/21/24 0826

## 2024-05-21 NOTE — Progress Notes (Addendum)
 Ms. Eubanks presents to the ED today complaining of SOB and CP.  Seen and examined patient at bedside in the ED.  She tells me her last dialysis was on 05/10/2024 due to her walker being broken so unable to get to her outpatient hemodialysis center.  She is not in acute respiratory distress and remains on room air.  Blood pressures are up.  She has a left permanent access with (+) B/T.  Plan is for dialysis sometime this afternoon.  Reviewed records here and noted ACS has been ruled out.  Hopeful she can be discharged after today's treatment.  I reached out to her outpatient hemodialysis center requesting they fax over her hemodialysis prescription.  Will perform an official consult if patient is admitted.  Dialysis Orders: [facility] [phone] Requested records to be faxed over EDW 62kg  Charmaine Piety, NP [facility]

## 2024-05-21 NOTE — ED Provider Notes (Signed)
 [place] EMERGENCY DEPARTMENT AT [facility] Provider Note   CSN: [id number] Arrival date & time: 05/20/24  2000     Patient presents with: Chest Pain   Stacey Lucas is a 48 y.o. female with history of atrial fibrillation, cocaine abuse, diabetes, ESRD on dialysis Monday Wednesday Friday.  The patient presents to ED for evaluation of chest pain.  Reports that she has had chest pain since Monday of this week.  Reports that she often gets chest pain after she misses dialysis.  Reports he has not had dialysis since Monday due to the fact that her walker broke.  She reports that without her walker, she was unable to walk to the bus stop to get on the bus for dialysis.  She reports her husband brought her to the ER tonight but states her husband is unable to take her to dialysis in [facility] because he works at 5 AM.  She states that she has had on and off chest pain throughout the week.  She endorses a little bit of shortness of breath.  She denies nausea, vomiting, diarrhea, abdominal pain.  She reports she often feels as if after missing dialysis.   Chest Pain Associated symptoms: shortness of breath        Prior to Admission medications   Medication Sig Start Date End Date Taking? Authorizing Provider  amLODipine  (NORVASC ) 10 MG tablet Take 1 tablet (10 mg total) by mouth daily. 11/12/23   Newlin, Enobong, MD  apixaban  (ELIQUIS ) 2.5 MG TABS tablet Take 1 tablet (2.5 mg total) by mouth 2 (two) times daily. 11/12/23   Newlin, Enobong, MD  atorvastatin  (LIPITOR ) 40 MG tablet Take 1 tablet (40 mg total) by mouth daily. 07/08/23   Krishnan, Gokul, MD  buPROPion  (WELLBUTRIN  XL) 150 MG 24 hr tablet Take 1 tablet (150 mg total) by mouth daily. 11/12/23   Newlin, Enobong, MD  carvedilol  (COREG ) 6.25 MG tablet Take 1 tablet (6.25 mg total) by mouth 2 (two) times daily with a meal. 11/12/23   Delbert Clam, MD  DULoxetine  (CYMBALTA ) 30 MG capsule Take 1 capsule (30 mg total) by mouth daily  for chronic pain 11/12/23   Newlin, Enobong, MD  hydrALAZINE  (APRESOLINE ) 25 MG tablet Take 1 tablet (25 mg total) by mouth 2 (two) times daily. 02/17/24   Newlin, Enobong, MD  hydrOXYzine  (ATARAX ) 25 MG tablet Take 1 tablet (25 mg total) by mouth at bedtime as needed. 11/12/23   Newlin, Enobong, MD  Misc. Devices MISC Shower chair, rolling walker with seat.  Diagnosis end-stage renal disease 02/17/24   Delbert Clam, MD  Misc. Devices MISC adult pull ups (medium), bed pads (large), body wipes (large) .  Diagnosis end-stage renal disease on hemodialysis 02/17/24   Delbert Clam, MD  nicotine  polacrilex (NICORETTE) 4 MG gum Take by mouth. 10/31/23   [provider]  ondansetron  (ZOFRAN -ODT) 4 MG disintegrating tablet Take 1 tablet (4 mg total) by mouth every 8 (eight) hours as needed for nausea or vomiting. 02/01/24   Raford Lenis, MD    Allergies: Neurontin  [gabapentin ]    Review of Systems  Respiratory:  Positive for shortness of breath.   Cardiovascular:  Positive for chest pain.  All other systems reviewed and are negative.   Updated Vital Signs BP (!) 182/83 (BP Location: Right Arm)   Pulse 80   Temp 97.8 F (36.6 C) (Oral)   Resp 17   LMP 10/11/2019   SpO2 100%   Physical Exam Vitals and  nursing note reviewed.  Constitutional:      General: She is not in acute distress.    Appearance: She is well-developed.  HENT:     Head: Normocephalic and atraumatic.  Eyes:     Conjunctiva/sclera: Conjunctivae normal.  Cardiovascular:     Rate and Rhythm: Normal rate and regular rhythm.     Heart sounds: No murmur heard. Pulmonary:     Effort: Pulmonary effort is normal. No respiratory distress.     Breath sounds: Normal breath sounds.  Abdominal:     Palpations: Abdomen is soft.     Tenderness: There is no abdominal tenderness.  Musculoskeletal:        General: No swelling.     Cervical back: Neck supple.     Right lower leg: No edema.     Left lower leg: No edema.   Skin:    General: Skin is warm and dry.     Capillary Refill: Capillary refill takes less than 2 seconds.  Neurological:     Mental Status: She is alert and oriented to person, place, and time. Mental status is at baseline.  Psychiatric:        Mood and Affect: Mood normal.     (all labs ordered are listed, but only abnormal results are displayed) Labs Reviewed  BASIC METABOLIC PANEL WITH GFR - Abnormal; Notable for the following components:      Result Value   CO2 21 (*)    Glucose, Bld 159 (*)    BUN 60 (*)    Creatinine, Ser 12.23 (*)    Calcium  7.1 (*)    GFR, Estimated 3 (*)    Anion gap 22 (*)    All other components within normal limits  CBC - Abnormal; Notable for the following components:   Hemoglobin 11.4 (*)    HCT 35.1 (*)    RDW 20.5 (*)    All other components within normal limits  TROPONIN I (HIGH SENSITIVITY) - Abnormal; Notable for the following components:   Troponin I (High Sensitivity) 39 (*)    All other components within normal limits  TROPONIN I (HIGH SENSITIVITY) - Abnormal; Notable for the following components:   Troponin I (High Sensitivity) 36 (*)    All other components within normal limits    EKG: None  Radiology: DG Chest 2 View Result Date: 05/20/2024 CLINICAL DATA:  Chest pain EXAM: CHEST - 2 VIEW COMPARISON:  04/16/2024 FINDINGS: Heart is borderline in size. Mediastinal contours within normal limits. No confluent airspace opacities, effusions or edema. No acute bony abnormality. IMPRESSION: Borderline cardiomegaly.  No active disease. Electronically Signed   By: Franky Crease M.D.   On: 05/20/2024 21:02    Procedures   Medications Ordered in the ED - No data to display  Medical Decision Making Amount and/or Complexity of Data Reviewed Labs: ordered. Radiology: ordered.   This is a 48 year old female ESRD Monday Wednesday Friday presents for chest pain, shortness of breath.  On exam, patient is hypertensive but denies headache,  blurred vision.  She is afebrile and nontachycardic.  Her lung sounds are clear bilaterally, she is not hypoxic.  Abdomen is soft and compressible.  Neurological examination at baseline.  Patient labs collected in triage.  CBC without leukocytosis or anemia.  Metabolic panel without electrolyte derangement, creatinine 2.23, GFR 3, anion gap 22.  Patient troponin 39, delta 36.  This is patient baseline.  Chest x-ray unremarkable, no pleural effusions.  EKG nonischemic.  Patient most likely  in need of dialysis.  She was discussed with on-call nephrologist Dr. Windle who agrees to dialyze patient today.  Patient also had TOC consult placed to either assist with finding patient new walker or having patient get transportation arranged for dialysis sessions.  Patient amenable to plan.  Patient stable at this time.    Final diagnoses:  Chest pain, unspecified type  ESRD needing dialysis [facility])    ED Discharge Orders     None          Ruthell Lonni JULIANNA DEVONNA 05/21/24 9385    Haze Lonni PARAS, MD 05/24/24 0009

## 2024-05-21 NOTE — Progress Notes (Addendum)
 Pt receives out-pt dialysis at [facility], MWF, 11:15am chair time. Will continue to assist as needed.   Lavanda Mikle [facility] Dialysis navigator [phone]  Late note entry addendum 9/15 10:56am D/c over weekend noted, contacted clinic to expect him back this morning. No further support needed.

## 2024-05-21 NOTE — Progress Notes (Signed)
 Received patient in bed to unit.  Alert and oriented.  Informed consent signed and in chart.   TX duration: 3 hours and 30 minutes  Patient tolerated well.  Transported back to the room  Alert, without acute distress.  Hand-off given to patient's nurse.   Access used: Left upper arm fistula Access issues: none  Total UF removed: 3L Medication(s) given: none   05/21/24 1745  Vitals  Temp 98.8 F (37.1 C)  Temp Source Oral  BP (!) 185/90  MAP (mmHg) 111  Pulse Rate 83  ECG Heart Rate 83  Resp 17  Oxygen Therapy  SpO2 99 %  O2 Device Room Air  During Treatment Monitoring  Duration of HD Treatment -hour(s) 3.5 hour(s)  HD Safety Checks Performed Yes  Intra-Hemodialysis Comments Tx completed  Post Treatment  Dialyzer Clearance Clear  Liters Processed 71.3  Fluid Removed (mL) 3000 mL  Tolerated HD Treatment Yes  AVG/AVF Arterial Site Held (minutes) 7 minutes  AVG/AVF Venous Site Held (minutes) 7 minutes  Fistula / Graft Left Upper arm Arteriovenous fistula  Placement Date/Time: 02/28/24 0630   Orientation: Left  Access Location: Upper arm  Access Type: Arteriovenous fistula  Site Condition No complications  Fistula / Graft Assessment Present;Thrill;Bruit  Status Deaccessed  Drainage Description None     Camellia Brasil LPN [facility]

## 2024-05-21 NOTE — ED Notes (Signed)
 Report given to dialysis RN

## 2024-05-21 NOTE — ED Notes (Signed)
 Pt to dialysis.

## 2024-05-26 ENCOUNTER — Other Ambulatory Visit: Payer: Self-pay

## 2024-05-26 ENCOUNTER — Emergency Department (HOSPITAL_COMMUNITY)

## 2024-05-26 ENCOUNTER — Emergency Department (HOSPITAL_COMMUNITY): Admission: EM | Admit: 2024-05-26 | Discharge: 2024-05-27 | Disposition: A | Discharge status: Home / Self Care

## 2024-05-26 ENCOUNTER — Encounter (HOSPITAL_COMMUNITY): Payer: Self-pay | Admitting: Nephrology

## 2024-05-26 DIAGNOSIS — I120 Hypertensive chronic kidney disease with stage 5 chronic kidney disease or end stage renal disease: Secondary | ICD-10-CM | POA: Diagnosis not present

## 2024-05-26 DIAGNOSIS — Z992 Dependence on renal dialysis: Secondary | ICD-10-CM | POA: Insufficient documentation

## 2024-05-26 DIAGNOSIS — Z79899 Other long term (current) drug therapy: Secondary | ICD-10-CM | POA: Diagnosis not present

## 2024-05-26 DIAGNOSIS — R000 Tachycardia, unspecified: Secondary | ICD-10-CM | POA: Diagnosis not present

## 2024-05-26 DIAGNOSIS — Z7901 Long term (current) use of anticoagulants: Secondary | ICD-10-CM | POA: Insufficient documentation

## 2024-05-26 DIAGNOSIS — R0602 Shortness of breath: Secondary | ICD-10-CM | POA: Diagnosis present

## 2024-05-26 DIAGNOSIS — I160 Hypertensive urgency: Secondary | ICD-10-CM

## 2024-05-26 DIAGNOSIS — N186 End stage renal disease: Secondary | ICD-10-CM | POA: Insufficient documentation

## 2024-05-26 DIAGNOSIS — E875 Hyperkalemia: Secondary | ICD-10-CM | POA: Insufficient documentation

## 2024-05-26 DIAGNOSIS — Z794 Long term (current) use of insulin: Secondary | ICD-10-CM | POA: Insufficient documentation

## 2024-05-26 LAB — I-STAT CHEM 8, ED
BUN: 96 mg/dL — ABNORMAL HIGH (ref 6–20)
Calcium, Ion: 0.82 mmol/L — CL (ref 1.15–1.40)
Chloride: 108 mmol/L (ref 98–111)
Creatinine, Ser: 16.5 mg/dL — ABNORMAL HIGH (ref 0.44–1.00)
Glucose, Bld: 65 mg/dL — ABNORMAL LOW (ref 70–99)
HCT: 38 % (ref 36.0–46.0)
Hemoglobin: 12.9 g/dL (ref 12.0–15.0)
Potassium: 7.4 mmol/L (ref 3.5–5.1)
Sodium: 135 mmol/L (ref 135–145)
TCO2: 19 mmol/L — ABNORMAL LOW (ref 22–32)

## 2024-05-26 LAB — CBC
HCT: 36.1 % (ref 36.0–46.0)
Hemoglobin: 11.6 g/dL — ABNORMAL LOW (ref 12.0–15.0)
MCH: 26.4 pg (ref 26.0–34.0)
MCHC: 32.1 g/dL (ref 30.0–36.0)
MCV: 82.2 fL (ref 80.0–100.0)
Platelets: 281 K/uL (ref 150–400)
RBC: 4.39 MIL/uL (ref 3.87–5.11)
RDW: 19.7 % — ABNORMAL HIGH (ref 11.5–15.5)
WBC: 10.7 K/uL — ABNORMAL HIGH (ref 4.0–10.5)
nRBC: 0 % (ref 0.0–0.2)

## 2024-05-26 LAB — BASIC METABOLIC PANEL WITH GFR
Anion gap: 26 — ABNORMAL HIGH (ref 5–15)
BUN: 108 mg/dL — ABNORMAL HIGH (ref 6–20)
CO2: 16 mmol/L — ABNORMAL LOW (ref 22–32)
Calcium: 8.3 mg/dL — ABNORMAL LOW (ref 8.9–10.3)
Chloride: 98 mmol/L (ref 98–111)
Creatinine, Ser: 14.86 mg/dL — ABNORMAL HIGH (ref 0.44–1.00)
GFR, Estimated: 3 mL/min — ABNORMAL LOW (ref >60)
Glucose, Bld: 64 mg/dL — ABNORMAL LOW (ref 70–99)
Potassium: >7.5 mmol/L (ref 3.5–5.1)
Sodium: 140 mmol/L (ref 135–145)

## 2024-05-26 LAB — CBG MONITORING, ED: Glucose-Capillary: 131 mg/dL — ABNORMAL HIGH (ref 70–99)

## 2024-05-26 LAB — HEPATITIS B SURFACE ANTIGEN: Hepatitis B Surface Ag: NONREACTIVE

## 2024-05-26 MED ORDER — CALCIUM GLUCONATE 10 % IV SOLN
1.0000 g | Freq: Once | INTRAVENOUS | Status: AC
  Administered 2024-05-26: 1 g via INTRAVENOUS
  Filled 2024-05-26: qty 10

## 2024-05-26 MED ORDER — CHLORHEXIDINE GLUCONATE CLOTH 2 % EX PADS: 6.0000 | MEDICATED_PAD | Freq: Every day | CUTANEOUS | Status: DC

## 2024-05-26 MED ORDER — ALBUTEROL SULFATE (2.5 MG/3ML) 0.083% IN NEBU
10.0000 mg | INHALATION_SOLUTION | Freq: Once | RESPIRATORY_TRACT | Status: AC
  Administered 2024-05-26: 10 mg via RESPIRATORY_TRACT
  Filled 2024-05-26: qty 12

## 2024-05-26 MED ORDER — SODIUM BICARBONATE 8.4 % IV SOLN
50.0000 meq | Freq: Once | INTRAVENOUS | Status: AC
  Administered 2024-05-26: 50 meq via INTRAVENOUS
  Filled 2024-05-26: qty 50

## 2024-05-26 MED ORDER — LABETALOL HCL 5 MG/ML IV SOLN
10.0000 mg | Freq: Once | INTRAVENOUS | Status: AC
  Administered 2024-05-26: 10 mg via INTRAVENOUS
  Filled 2024-05-26: qty 4

## 2024-05-26 MED ORDER — SODIUM ZIRCONIUM CYCLOSILICATE 10 G PO PACK
10.0000 g | PACK | Freq: Once | ORAL | Status: AC
  Administered 2024-05-26: 10 g via ORAL
  Filled 2024-05-26: qty 1

## 2024-05-26 MED ORDER — HEPARIN SODIUM (PORCINE) 1000 UNIT/ML IJ SOLN
INTRAMUSCULAR | Status: AC
  Filled 2024-05-26: qty 1

## 2024-05-26 MED ORDER — DEXTROSE 50 % IV SOLN
1.0000 | Freq: Once | INTRAVENOUS | Status: AC
  Administered 2024-05-26: 50 mL via INTRAVENOUS
  Filled 2024-05-26: qty 50

## 2024-05-26 MED ORDER — INSULIN ASPART 100 UNIT/ML IV SOLN
5.0000 [IU] | Freq: Once | INTRAVENOUS | Status: AC
  Administered 2024-05-26: 5 [IU] via INTRAVENOUS

## 2024-05-26 NOTE — ED Provider Notes (Signed)
 Consult to Dr. Geralynn with nephrology who will arrange for ER HD, requests hyper K orders. Discussed with Dr. Ula, ER attending, aware of plan of care.    Beverley Leita LABOR, PA-C 05/26/24 1416    Ula Prentice SAUNDERS, MD 05/26/24 [phone]

## 2024-05-26 NOTE — TOC Progression Note (Signed)
 Transition of Care [facility]) - Progression Note    Patient Details  Name: Stacey Lucas MRN: [id number] Date of Birth: 20-Oct-1975  [facility] of [facility]) CM/SW Contact  Luann SHAUNNA [place], [place] Phone Number: 05/26/2024, 3:53 PM  Clinical Narrative:     Inpatient Care Management(ICM) consulted for transportation issues. CSW met with pt bedside who uses [facility] to get to dialysis. She states they did not pick her up for HD on Monday or Wednesday. She things this was due to pt missing he regular pickups due to being in hospital and [facility] not aware when pt got out. CSW informed pt to call [facility] when she gets home from hospital to confirm with them that she is out and they will transport.   CSW called [employer] and confirmed they plan to pick pt up on Friday for her regular OPHD time.                     Expected Discharge Plan and Services                                               Social Drivers of Health (SDOH) Interventions SDOH Screenings   Food Insecurity: Food Insecurity Present (02/17/2024)  Housing: High Risk (02/17/2024)  Transportation Needs: No Transportation Needs (02/17/2024)  Utilities: At Risk (02/17/2024)  Alcohol Screen: Low Risk  (04/25/2018)  Depression (PHQ2-9): Low Risk  (02/17/2024)  Financial Resource Strain: High Risk (10/29/2023)   Received from [facility]  Social Connections: Moderately Integrated (02/17/2024)  Stress: Stress Concern Present (03/20/2023)  Tobacco Use: High Risk (05/20/2024)    Readmission Risk Interventions     No data to display

## 2024-05-26 NOTE — ED Notes (Signed)
 Report received that 3L pulled off during 3.5 ours of dialysis. Fistula/graft difficult to stop bleeding so they prefer pt  wait 1 hour prior to D/C.

## 2024-05-26 NOTE — ED Provider Notes (Signed)
 Waiting for dialysis. Anticipated d/c after dialysis. Physical Exam  BP (!) 190/81 (BP Location: Right Arm)   Pulse 90   Temp 98.2 F (36.8 C) (Oral)   Resp 18   LMP 10/11/2019   SpO2 100%   Physical Exam  Procedures  Procedures  ED Course / MDM    Medical Decision Making Amount and/or Complexity of Data Reviewed Labs: ordered. Radiology: ordered.  Risk OTC drugs. Prescription drug management.          Armenta Canning, MD 05/27/24 [phone]

## 2024-05-26 NOTE — ED Notes (Signed)
 Patient transported to X-ray

## 2024-05-26 NOTE — ED Provider Triage Note (Signed)
 Emergency Medicine Provider Triage Evaluation Note  Stacey Lucas , a 48 y.o. female  was evaluated in triage.  Pt complains of fatigue. Normally has dialysis M/W/F, has not been in a while states dialysis center normally picks her up but they haven't in a  long time for unknown reasons.   Review of Systems  Positive:  Negative:   Physical Exam  BP (!) 210/94 (BP Location: Right Arm)   Pulse 97   Temp 98.2 F (36.8 C) (Oral)   Resp 18   LMP 10/11/2019   SpO2 100%  Gen:   Awake, no distress   Resp:  Normal effort  MSK:   Moves extremities without difficulty  Other:  Lungs CTA, no lower ext edema  Medical Decision Making  Medically screening exam initiated at 1:13 PM.  Appropriate orders placed.  Lynne Takemoto was informed that the remainder of the evaluation will be completed by another provider, this initial triage assessment does not replace that evaluation, and the importance of remaining in the ED until their evaluation is complete.     Beverley Leita LABOR, PA-C 05/26/24 1317

## 2024-05-26 NOTE — Progress Notes (Addendum)
 Our team was notified that Ms. Linares is here in setting of missed HD and hyperK (7.4). EKG without peaked Ts. HD orders placed, full HD unit now, so will be around 6pm - rec medical management meds until that time.   Izetta Boehringer, PA-C [facility] Pager [phone]   Pt seen in ED room. States transportation didn't pick her up Monday or today. She was in hospital last week, dc'd around Friday. She is on HD MWF at [facility] on [address]. On HD x 1 year. Exam is neg for vol overload.  K+ high at 7.4, ED giving temp measures. Plan HD later this evening. Will be returned to ED after HD for reassessment.   Myer Fret  MD  [facility] 05/26/2024, 3:34 PM  Recent Labs  Lab 05/20/24 2035 05/26/24 1344 05/26/24 1407  HGB 11.4* 12.9 11.6*  CALCIUM  7.1*  --   --   CREATININE 12.23* 16.50*  --   K 4.2 7.4*  --

## 2024-05-26 NOTE — ED Provider Notes (Signed)
 [place] EMERGENCY DEPARTMENT AT  HOSPITAL Provider Note   CSN: [id number] Arrival date & time: 05/26/24  9057     Patient presents with: needs dialysis, Shortness of Breath, Chest Pain, and Vascular Access Problem   Stacey Lucas is a 48 y.o. female.   48 year old female with past medical history of hypertension and hyperlipidemia as well as end-stage renal disease with last dialysis on 05/20/2024 presenting to the emergency department today requesting dialysis.  The patient states she has had some shortness of breath over the past day or 2.  She denies any associated chest pain.  She was noted to be hyperkalemic on i-STAT lab testing.  Leita Chancy, PA spoke with Dr. Geralynn from nephrology.  Plan is for dialysis afternoon.  Will need temporizing treatment here in the emergency department until they are ready for her for dialysis.  The patient's EKG interpreted by me does show some peaked T waves.   Shortness of Breath Chest Pain Associated symptoms: shortness of breath        Prior to Admission medications   Medication Sig Start Date End Date Taking? Authorizing Provider  amLODipine  (NORVASC ) 10 MG tablet Take 1 tablet (10 mg total) by mouth daily. 11/12/23   Newlin, Enobong, MD  apixaban  (ELIQUIS ) 2.5 MG TABS tablet Take 1 tablet (2.5 mg total) by mouth 2 (two) times daily. 11/12/23   Newlin, Enobong, MD  atorvastatin  (LIPITOR ) 40 MG tablet Take 1 tablet (40 mg total) by mouth daily. 07/08/23   Krishnan, Gokul, MD  buPROPion  (WELLBUTRIN  XL) 150 MG 24 hr tablet Take 1 tablet (150 mg total) by mouth daily. 11/12/23   Newlin, Enobong, MD  carvedilol  (COREG ) 6.25 MG tablet Take 1 tablet (6.25 mg total) by mouth 2 (two) times daily with a meal. 11/12/23   Delbert Clam, MD  DULoxetine  (CYMBALTA ) 30 MG capsule Take 1 capsule (30 mg total) by mouth daily for chronic pain 11/12/23   Newlin, Enobong, MD  hydrALAZINE  (APRESOLINE ) 25 MG tablet Take 1 tablet (25 mg total) by mouth 2 (two)  times daily. 02/17/24   Newlin, Enobong, MD  hydrOXYzine  (ATARAX ) 25 MG tablet Take 1 tablet (25 mg total) by mouth at bedtime as needed. 11/12/23   Newlin, Enobong, MD  nicotine  polacrilex (NICORETTE) 4 MG gum Take by mouth. 10/31/23   [provider]  ondansetron  (ZOFRAN -ODT) 4 MG disintegrating tablet Take 1 tablet (4 mg total) by mouth every 8 (eight) hours as needed for nausea or vomiting. 02/01/24   Raford Lenis, MD    Allergies: Neurontin  [gabapentin ]    Review of Systems  Respiratory:  Positive for shortness of breath.     Updated Vital Signs BP (!) 200/101   Pulse 97   Temp 98.2 F (36.8 C) (Oral)   Resp 19   LMP 10/11/2019   SpO2 100%   Physical Exam Vitals and nursing note reviewed.   Gen: NAD Eyes: PERRL, EOMI HEENT: no oropharyngeal swelling Neck: trachea midline Resp: clear to auscultation bilaterally Card: RRR, no murmurs, rubs, or gallops Abd: nontender, nondistended Extremities: no calf tenderness, no edema Vascular: 2+ radial pulses bilaterally, 2+ DP pulses bilaterally Skin: no rashes Psyc: acting appropriately   (all labs ordered are listed, but only abnormal results are displayed) Labs Reviewed  CBC - Abnormal; Notable for the following components:      Result Value   WBC 10.7 (*)    Hemoglobin 11.6 (*)    RDW 19.7 (*)    All other components  within normal limits  I-STAT CHEM 8, ED - Abnormal; Notable for the following components:   Potassium 7.4 (*)    BUN 96 (*)    Creatinine, Ser 16.50 (*)    Glucose, Bld 65 (*)    Calcium , Ion 0.82 (*)    TCO2 19 (*)    All other components within normal limits  BASIC METABOLIC PANEL WITH GFR  HEPATITIS B SURFACE ANTIGEN  HEPATITIS B SURFACE ANTIBODY, QUANTITATIVE    EKG: EKG Interpretation Date/Time:  Wednesday May 26 2024 09:52:47 EDT Ventricular Rate:  106 PR Interval:  168 QRS Duration:  84 QT Interval:  384 QTC Calculation: 510 R Axis:   -47  Text Interpretation: Sinus  tachycardia Right atrial enlargement Left anterior fascicular block Minimal voltage criteria for LVH, may be normal variant ( R in aVL ) Possible Anterior infarct , age undetermined T wave abnormality, consider lateral ischemia Abnormal ECG When compared with ECG of 20-May-2024 20:31, PREVIOUS ECG IS PRESENT Confirmed by Ula Barter [phone]) on 05/26/2024 2:22:17 PM  Radiology: DG Chest 2 View Result Date: 05/26/2024 CLINICAL DATA:  Shortness of breath for 2 days. EXAM: CHEST - 2 VIEW COMPARISON:  May 20, 2024. FINDINGS: Stable cardiomegaly. Both lungs are clear. The visualized skeletal structures are unremarkable. IMPRESSION: No active cardiopulmonary disease. Electronically Signed   By: Lynwood Landy Raddle M.D.   On: 05/26/2024 10:47     Procedures   Medications Ordered in the ED  Chlorhexidine  Gluconate Cloth 2 % PADS 6 each (has no administration in time range)  labetalol  (NORMODYNE ) injection 10 mg (has no administration in time range)  insulin  aspart (novoLOG ) injection 5 Units (5 Units Intravenous Given 05/26/24 1505)    And  dextrose  50 % solution 50 mL (50 mLs Intravenous Given 05/26/24 1455)  sodium zirconium cyclosilicate  (LOKELMA ) packet 10 g (10 g Oral Given 05/26/24 1508)  albuterol  (PROVENTIL ) (2.5 MG/3ML) 0.083% nebulizer solution 10 mg (10 mg Nebulization Given by Other 05/26/24 1436)  calcium  gluconate inj 10% (1 g) URGENT USE ONLY! (1 g Intravenous Given 05/26/24 1503)  sodium bicarbonate  injection 50 mEq (50 mEq Intravenous Given 05/26/24 1459)                                    Medical Decision Making 48 year old female with past medical history of hyperlipidemia and end-stage renal disease on dialysis presenting to the emergency department today with concern for hyperkalemia on initial labs and EKG.  I will treat the patient with calcium , bicarbonate, insulin , dextrose , and albuterol  here.  Will also give her Lokelma .  The patient blood pressure is elevated here as well.   Will give her a dose of labetalol  here.  Plan is for monitoring here until we are able to get the patient to dialysis.  CRITICAL CARE Performed by: Barter JONELLE Ula   Total critical care time: 35 minutes  Critical care time was exclusive of separately billable procedures and treating other patients.  Critical care was necessary to treat or prevent imminent or life-threatening deterioration.  Critical care was time spent personally by me on the following activities: development of treatment plan with patient and/or surrogate as well as nursing, discussions with consultants, evaluation of patient's response to treatment, examination of patient, obtaining history from patient or surrogate, ordering and performing treatments and interventions, ordering and review of laboratory studies, ordering and review of radiographic studies, pulse oximetry and re-evaluation of  patient's condition.   Amount and/or Complexity of Data Reviewed Labs: ordered. Radiology: ordered.  Risk OTC drugs. Prescription drug management.        Final diagnoses:  Hyperkalemia  Hypertensive urgency    ED Discharge Orders     None          Ula Prentice SAUNDERS, MD 05/26/24 [phone]

## 2024-05-26 NOTE — Progress Notes (Addendum)
 Pt receives out-pt dialysis at [facility], MWF, 11:15am chair time.    Contacted out-pt clinic at this time, they stated pt did not show Monday, and that she does still have a spot. Clinic stated they did not receive a call and attempted to notify pt, however when asked what phone number they used, it did not match our demo. Number on demographics given to clinic at this time. Will continue to assist as needed.   [facility] Teona Vargus Dialysis navigator [phone] [place] dialysiss#- [phone]

## 2024-05-26 NOTE — ED Notes (Signed)
 Pt transported to dialysis

## 2024-05-26 NOTE — ED Triage Notes (Signed)
 Pt. Stated, I've been coming here for dialysis , Last dialyzed  was Sept. 11, stated, they havent been by to pick me up to go to free standing. I called this morning and they said they didn't have me down anymore. I guess I did my full treatment.

## 2024-05-27 LAB — HEPATITIS B SURFACE ANTIBODY, QUANTITATIVE: Hep B S AB Quant (Post): 26.4 m[IU]/mL

## 2024-05-27 NOTE — Progress Notes (Signed)
   05/26/24 2240  Vitals  BP (!) 186/87  MAP (mmHg) 113  Pulse Rate 86  Oxygen Therapy  SpO2 99 %  During Treatment Monitoring  Blood Flow Rate (mL/min) 0 mL/min  Arterial Pressure (mmHg) 24.64 mmHg  Venous Pressure (mmHg) 6.46 mmHg  TMP (mmHg) 16.36 mmHg  Ultrafiltration Rate (mL/min) 1126 mL/min  Dialysate Flow Rate (mL/min) 299 ml/min  Dialysate Potassium Concentration 2  Dialysate Calcium  Concentration 2.5  Duration of HD Treatment -hour(s) 3.5 hour(s)  Cumulative Fluid Removed (mL) per Treatment  3000.23  HD Safety Checks Performed Yes  Intra-Hemodialysis Comments Tx completed  Post Treatment  Dialyzer Clearance Lightly streaked  Liters Processed 74.5  Fluid Removed (mL) 3000 mL  Tolerated HD Treatment Yes  AVG/AVF Arterial Site Held (minutes) 12 minutes  AVG/AVF Venous Site Held (minutes) 12 minutes  Fistula / Graft Left Upper arm Arteriovenous fistula  Placement Date/Time: 02/28/24 0630   Orientation: Left  Access Location: Upper arm  Access Type: Arteriovenous fistula  Site Condition No complications  Fistula / Graft Assessment Present;Thrill;Bruit  Status Deaccessed  Drainage Description None

## 2024-05-27 NOTE — Discharge Instructions (Signed)
 You were evaluated in the Emergency Department and after careful evaluation, we did not find any emergent condition requiring admission or further testing in the hospital.  You were treated with the emergent dialysis because of your high potassium levels.  Important that you follow-up with your regular dialysis schedule.  Please return to the Emergency Department if you experience any worsening of your condition.   Thank you for allowing us  to be a part of your care.

## 2024-05-27 NOTE — ED Notes (Signed)
 Facility issued scrubs provided as pt reports taht she soiled her clothing earlier. PT is in possession of her rolling walker, as well as, her clothing and other belongings.

## 2024-05-27 NOTE — Progress Notes (Signed)
 Late note entry 9/18, 0923am Contacted out-pt HD clinic, [facility] to inform that pt did not show due to transportation, and with the help of SW, now should be arriving back on usual schedule Friday. No further support needed at this time.  Kallan Merrick Dialysis Navigator [phone]

## 2024-05-27 NOTE — ED Notes (Signed)
 Another call to husband, left message.

## 2024-05-27 NOTE — ED Provider Notes (Signed)
  Provider Note MRN:  [id number]  Arrival date & time: 05/27/24    ED Course and Medical Decision Making  Assumed care of patient at sign-out or upon transfer.  Patient returning from dialysis, says she is feeling better.  Reportedly she had some mild persistent bleeding from her AV fistula and so plan is to observe her for 1 hour per HD access recommendations.  Anticipating discharge if no further bleeding.  Procedures  Final Clinical Impressions(s) / ED Diagnoses     ICD-10-CM   1. Hyperkalemia  E87.5     2. Hypertensive urgency  I16.0       ED Discharge Orders     None       Discharge Instructions   None     Ozell HERO. Theadore, MD [facility] Emergency [facility] [email or web address]    Theadore Ozell HERO, MD 05/27/24 [phone]

## 2024-05-27 NOTE — ED Notes (Signed)
 Attempted to reach husband twice for patient to be picked up, left message.

## 2024-05-27 NOTE — ED Notes (Signed)
 Attempting to contact pt's husband, Beverley. No answer at phone numberprovided x 3 attempts. Message left for him to give us  a call when message received.

## 2024-06-15 ENCOUNTER — Telehealth: Payer: Self-pay | Admitting: Family Medicine

## 2024-06-15 NOTE — Telephone Encounter (Signed)
 2nd attempt contacted pt lvm to resch appt (called and text 3x) pcp not in the office

## 2024-06-21 ENCOUNTER — Ambulatory Visit: Admitting: Family Medicine

## 2024-06-22 ENCOUNTER — Ambulatory Visit: Admitting: Family Medicine

## 2024-06-29 ENCOUNTER — Encounter (HOSPITAL_COMMUNITY): Payer: Self-pay

## 2024-06-30 ENCOUNTER — Telehealth: Payer: Self-pay

## 2024-06-30 NOTE — Telephone Encounter (Signed)
 [name] from [facility] called [facility] Triage line stating the pt has left town and that she doesn't think Stacey Lucas will come for her fistulagram scheduled for 07/01/24.  Called patient but did not get an answer. No VM so could not leave a message.  Alan Glance notified the [facility] that the patient may be a no show.

## 2024-07-01 ENCOUNTER — Ambulatory Visit (HOSPITAL_COMMUNITY): Admission: RE | Admit: 2024-07-01 | Admitting: Vascular Surgery

## 2024-07-01 ENCOUNTER — Encounter (HOSPITAL_COMMUNITY): Admission: RE | Payer: Self-pay | Source: Home / Self Care

## 2024-07-01 SURGERY — A/V FISTULAGRAM
Anesthesia: LOCAL | Site: Arm Upper | Laterality: Left

## 2024-07-05 NOTE — Discharge Summary (Signed)
 ------------------------------------------------------------------------------- Attestation signed by True, Wonda Fonder, MD at 07/09/24 2240 I saw and evaluated the patient, participating in the key portions of the service on the day of discharge.  I reviewed the resident's note and agree with the discharge plans and disposition. I personally spent 30 minutes in discharge planning services.   Wonda DELENA BREEN, MD 07/08/24  -------------------------------------------------------------------------------   Physician Discharge Summary [facility] 3 [facility] [address] [place] [address] Dept: [phone] Loc: [phone]   Identifying Information:  Stacey Lucas 07/15/1976 [id number]  Primary Care Physician: Dept/[place]   Code Status: Full Code  Admit Date: 06/22/2024  Discharge Date: 07/08/2024   Discharge To: Skilled nursing facility  Discharge Service: [facility] - Nephrology Floor Team (MED B - Tower)   Discharge Attending Physician: Wonda Breen, MD  Discharge Diagnoses:  Principal Problem:   Hypertensive emergency (POA: Yes) Active Problems:   ESRD (end stage renal disease) on dialysis    (CMS-HCC) (POA: Not Applicable)   Type 2 diabetes mellitus with renal complication (CMS-HCC) (POA: Yes)   Tobacco use disorder (POA: Yes)   Hypertension (POA: Unknown)   CVA (cerebral vascular accident)    (CMS-HCC) (POA: Unknown)   Elevated troponin (POA: Unknown)   Atrial fibrillation    (CMS-HCC) (POA: Unknown)   Anemia of chronic disease (POA: Unknown) Resolved Problems:   * Hypertensive Emergency *  Outpatient Provider Follow Up Issues:  [ ]  Recheck vitamin D after completion of supplementation  [ ]  Outpatient psychiatry evaluation for management depressed mood, could consider initiation of medical therapy   Hospital Course: Stacey Lucas is a 48 y.o. female with PMH of ESRD on iHD (MWF), HTN, T2DM, Afib, CVA who presented as a transfer from  [facility] ED with respiratory distress, found to have hypertensive emergency in the setting of missed dialysis. Admitted to MICU for nitro gtt BP management,once weaned, downgraded to floor status.   Hypertensive Emergency 2/2 Volume overload (resolved) Presented with respiratory distress, chest pain, and elevated troponins consistent with hypertensive emergency due to volume overload from missed dialysis. Managed with nitroglycerin  infusion (10/14-10/15) and hemodialysis on 10/14 and 10/15 with subsequent BP improvement. Transitioned to oral antihypertensives; amlodipine  discontinued and Coreg  dose reduced due to lower MAPs. Notably hypotensive during dialysis sessions.At discharge, MAPs stable in the 70s-80s. - Continue metop 6.25mg  BID for rate control for afib hx  Acute hypoxic respiratory failure (resolved) Initially hypoxic to 83% on room air, secondary to volume overload. Improved with BiPAP and diuresis via dialysis. She responded appropriately to BiPAP therapy, and has since been weaned to room air. Normal work of breathing at discharge on room air.   ESRD on HD MWF ? T/Th/S schedule outpatient Underlying CKD likely secondary to diabetes and hypertension. Missed dialysis prior to admission (last session 10/10). Dialyzes via functional LUE AV fistula. Volume overload contributed to hypertensive emergency and dyspnea. Initiated Sevelamer  800 mg TID during admission. Will transition to T/Th/S schedule at outpatient center in [facility] (transport arranged via [employer]). - Give midodrine 10 mg, 30 mins before dialysis session  - Continue Sevelamer  during outpatient dialysis sessions  Acute myocardial injury  Chest pain (resolved) Mild, non-radiating chest pain with troponin elevation (0.148 ? 0.140) likely from demand ischemia in hypertensive emergency. EKG with LVH and lateral T-wave inversions. No recurrent pain; symptoms resolved with blood pressure control.    Altered Mental Status  likely 2/2 Delirium History of Multiple CVA - L basal ganglia stroke 2023, MRI evidence of  L CR/BG, Bilateral CR strokes History of multiple prior CVAs (L basal ganglia 2023, bilateral corona radiata) with residual right-sided weakness. Initially at baseline on 10/14, later developed acute delirium with agitation and confusion. CT head negative for acute changes; MRI showed no new lesions. EEG normal. Neurology and psychiatry consulted. Psychiatry recommended IV thiamine, later transitioned to oral, and nicotine  patch for withdrawal prevention. Fluctuating mental status improved by discharge; mood stable. - Continue oral thiamine supplementation  - Continue Eliquis  2.5 mg BID - Recommend psychiatry and PCP follow up for mood management   Hx of Atrial Fibrillation  Rate controlled with Coreg , does not seem to be on Eliquis  per fill history, but should be on it per documentation.  Half dose of eliquis  as pt is low weight and low Cr. Switched coreg  to metoprolol   for less BP control during hospitalizations, as her Bps dropped to SBP of 90 with consistent HD. - Continue metoprol tartrate  25 mg BID  Vitamin D Def -supp w 1250mcg weekly  Hx tobacco use Continue Nicotine  replacement therapy   Opioid use disorder Continue suboxone 8-2 to BID   Anemia of chronic disease Hemoglobin 10.1 on arrival, appears to be around baseline of 10-11 over the last year.  Likely secondary to anemia of chronic kidney disease.    Diabetes mellitus II Not on any home medications. A1c 5.7% on admission.  Continue lifestyle management.  Hyponatremia (likely spurious as was drawn off carrier fluid) (10/15) Na 116 with concurrent glucose 731, thought to be lab artifact due to carrier fluid contamination. Asymptomatic; repeat Na normalized to 133 and glucose 137  Medical Equipment Recs based on PT/OT evaluation: 1. The patient has mobility limitations that interfere with performing ADL's and MADL's ( a WC will improve  this)  2. Mobility limitations cannot be corrected with a cane or walker  3. And that patient can express a willingness to use a wheelchair  4. The patient has adequate space in their living environment for a wheelchair  5. The patient has a caregiver who is available, willing, and able to provide assistance with the wheelchair And/Or 6. The patient has sufficient upper extremity function and other physical and mental capabilities needed to safely self-propel the manual wheelchair that is provided in the home during a typical day  Tub Transfer Bench: The patient has physical handicap and disease that limits their ability to transfer to and from the tub or shower safely   Bedside Commode [facility]):  The patient is confined to one room   The patient is confined to one level of the home with no toilet on that level   Touchbase with Outpatient Provider: Warm Handoff: Completed on 07/08/24 by Bobbette LOISE Ross, MD  (Intern) via [facility] Message  Procedures: None ______________________________________________________________________ Discharge Medications:    Your Medication List     STOP taking these medications    amlodipine  10 MG tablet Commonly known as: NORVASC    carvedilol  6.25 MG tablet Commonly known as: COREG        START taking these medications    atorvastatin  40 MG tablet Commonly known as: LIPITOR  Take 1 tablet (40 mg total) by mouth daily.   buprenorphine-naloxone 8-2 mg sublingual film Commonly known as: SUBOXONE Place 1 Film (8 mg of buprenorphine total) under the tongue two (2) times a day for 5 days.   ergocalciferol-1,250 mcg (50,000 unit) 1,250 mcg (50,000 unit) capsule Commonly known as: DRISDOL Take 1 capsule (1,250 mcg total) by mouth once a week for  2 doses. Starting July 12, 2024 Start taking on: July 12, 2024   melatonin 3 mg Tab Take 2 tablets (6 mg total) by mouth every evening.   metoPROLOL  tartrate 25 MG tablet Commonly known as:  Lopressor  Take 0.25 (one-fourth) tablet (6.25 mg total) by mouth two (2) times a day.   multivitamins, therapeutic with minerals 9 mg iron -400 mcg tablet Take 1 tablet by mouth daily.   ondansetron  4 MG disintegrating tablet Commonly known as: ZOFRAN -ODT Dissolve 1 tablet (4 mg total) in the mouth every eight (8) hours as needed for up to 7 days.   thiamine 100 MG tablet Commonly known as: B-1 Take 1 tablet (100 mg total) by mouth daily.       CONTINUE taking these medications    ELIQUIS  2.5 mg Tab Generic drug: apixaban  Take 1 tablet (2.5 mg total) by mouth two (2) times a day.   nicotine  14 mg/24 hr patch Commonly known as: NICODERM CQ  Place 1 patch on the skin daily.   sevelamer  800 mg tablet Commonly known as: RENVELA  Take 1 tablet (800 mg total) by mouth Three (3) times a day with a meal.      Medication Instructions:   Meds to bed delivered home medication to pt before discharge      Allergies: Gabapentin  ______________________________________________________________________ Pending Test Results:    Most Recent Labs: All lab results last 24 hours -  No results found for this or any previous visit (from the past 24 hours).  Microbiology -  Microbiology Results (last day)     ** No results found for the last 24 hours. **      CBC - Results in Past 30 Days Result Component Current Result Ref Range Previous Result Ref Range  HCT 34.0 (07/07/2024) 34.0 - 44.0 % 34.1 (07/05/2024) 34.0 - 44.0 %  HGB 10.8 (L) (07/07/2024) 11.3 - 14.9 g/dL 88.9 (L) (89/72/7974) 88.6 - 14.9 g/dL  MCH 73.1 (89/70/7974) 25.9 - 32.4 pg 26.8 (07/05/2024) 25.9 - 32.4 pg  MCHC 31.7 (L) (07/07/2024) 32.0 - 36.0 g/dL 67.7 (89/72/7974) 67.9 - 36.0 g/dL  MCV 15.4 (89/70/7974) 77.6 - 95.7 fL 83.1 (07/05/2024) 77.6 - 95.7 fL  MPV 10.2 (07/07/2024) 6.8 - 10.7 fL 9.9 (07/05/2024) 6.8 - 10.7 fL  Platelet 192 (07/07/2024) 150 - 450 10*9/L 229 (07/05/2024) 150 - 450 10*9/L  RBC 4.02  (07/07/2024) 3.95 - 5.13 10*12/L 4.11 (07/05/2024) 3.95 - 5.13 10*12/L  WBC 9.5 (07/07/2024) 3.6 - 11.2 10*9/L 9.5 (07/05/2024) 3.6 - 11.2 10*9/L   BMP - Results in Past 30 Days Result Component Current Result Ref Range Previous Result Ref Range  BUN 28 (H) (07/07/2024) 9 - 23 mg/dL 26 (H) (89/70/7974) 9 - 23 mg/dL  Chloride 92 (L) (89/70/7974) 98 - 107 mmol/L 92 (L) (07/07/2024) 98 - 107 mmol/L  CO2 26.0 (07/07/2024) 20.0 - 31.0 mmol/L 29.0 (07/07/2024) 20.0 - 31.0 mmol/L  Creatinine 8.88 (H) (07/07/2024) 0.55 - 1.02 mg/dL 1.32 (H) (89/70/7974) 9.44 - 1.02 mg/dL  Glucose 892 (89/70/7974) 70 - 179 mg/dL 872 (89/70/7974) 70 - 820 mg/dL  Potassium 4.5 (89/70/7974) 3.4 - 4.8 mmol/L 4.4 (07/07/2024) 3.4 - 4.8 mmol/L  Sodium 136 (07/07/2024) 135 - 145 mmol/L 139 (07/07/2024) 135 - 145 mmol/L   ABGs-  No results found for requested labs within last 30 days.   LFT's - Results in Past 30 Days Result Component Current Result Ref Range Previous Result Ref Range  Albumin  3.7 (06/30/2024) 3.4 - 5.0 g/dL 3.7 (89/79/7974) 3.4 - 5.0  g/dL  Alkaline Phosphatase 98 (06/30/2024) 46 - 116 U/L 90 (06/28/2024) 46 - 116 U/L  ALT <7 (L) (06/30/2024) 10 - 49 U/L <7 (L) (06/28/2024) 10 - 49 U/L  AST 23 (06/30/2024) <=34 U/L 10 (06/28/2024) <=34 U/L  Bilirubin, Direct 0.20 (06/30/2024) 0.00 - 0.30 mg/dL 9.79 (89/79/7974) 9.99 - 0.30 mg/dL  Total Bilirubin 0.4 (89/77/7974) 0.3 - 1.2 mg/dL 0.4 (89/79/7974) 0.3 - 1.2 mg/dL    Relevant Studies/Radiology: ECG 12 Lead Result Date: 07/03/2024 NORMAL SINUS RHYTHM LEFT AXIS DEVIATION LEFT VENTRICULAR HYPERTROPHY WITH REPOLARIZATION ABNORMALITY ( R in aVL , Sokolow-Lyon , Cornell product , Romhilt-Estes ) WHEN COMPARED WITH ECG OF 29-Jun-2024 11:04, NO SIGNIFICANT CHANGE WAS FOUND Confirmed by Vinie Dunnings ([id number]) on 07/03/2024 6:42:14 PM  ECG 12 Lead Result Date: 06/29/2024 NORMAL SINUS RHYTHM POSSIBLE LEFT ATRIAL ENLARGEMENT LEFT VENTRICULAR HYPERTROPHY WITH  REPOLARIZATION ABNORMALITY ( R in aVL , Cornell product ) ABNORMAL ECG WHEN COMPARED WITH ECG OF 22-Jun-2024 12:58, NONSPECIFIC T WAVE ABNORMALITY NO LONGER EVIDENT IN INFERIOR LEADS QT HAS SHORTENED Confirmed by Claudene Legions ([id number]) on 06/29/2024 7:58:22 PM  XR Abdomen 1 View Result Date: 06/28/2024 EXAM: XR ABDOMEN 1 VIEW ACCESSION: [id number] UN REPORT DATE: 06/28/2024 1:25 PM CLINICAL INDICATION: 48 years old with CONSTIPATION  COMPARISON: 03/03/2012 CT abdomen and pelvis TECHNIQUE: Supine view of the abdomen, 1 image(s) FINDINGS: Evaluation is slightly suboptimal as the lung bases and upper abdomen are excluded from the field-of-view. Gas is present in normal caliber large bowel. No dilated, gas-filled loops of small bowel. Moderate colonic stool burden. Scattered pelvic phleboliths. Vascular calcifications. No acute osseous abnormality.   Nonobstructive bowel gas pattern. Moderate colonic stool burden.  MRI Brain Wo Contrast Result Date: 06/27/2024 EXAM: Magnetic resonance imaging, brain, without contrast material. ACCESSION: [id number] UN CLINICAL INDICATION: 48 years old Female with AMS  COMPARISON: CT head dated 06/26/2024 TECHNIQUE: Multiplanar, multisequence MR imaging of the brain was performed without I.V. contrast. FINDINGS:  There are scattered and confluent foci of signal abnormality within the periventricular and deep white matter and to a lesser extent brainstem.  These are nonspecific but commonly seen with small vessel ischemic changes. Multiple chronic lacunar infarcts in the basal ganglia, periventricular white matter, corpus callosum and pons. Chronic microhemorrhages in the basal ganglia, corpus callosum, left parietal lobe and left cerebellum. Ventricles are normal in size. There is no midline shift. No extra-axial fluid collection. No evidence of intracranial hemorrhage. No diffusion weighted signal abnormality to suggest acute infarct. No mass. Intrinsically T1 hyperintense  retention cysts in the right maxillary sinus. Mild mucosal thickening of the left frontal and ethmoid sinuses. Chronic fracturing deformity of the left lamina papyracea.   Confluent white matter T2 FLAIR signal abnormality, multiple chronic lacunar infarcts and predominantly central scattered chronic microhemorrhages favored to represent combination of advanced for age chronic small vessel ischemic changes and chronic hypertensive encephalopathy. Otherwise, no acute appearing intracranial abnormality.   EEG video monitoring Result Date: 06/27/2024 Donnetta Clem Ade, MD     06/27/2024  8:24 AM Patient: Ebb JONETTA Ada Date of Birth: 1976-01-29 Attending: Clem Donnetta,  M.D. Ordering Provider: Claudene Rollo FALCON, MD  [facility] No: [id number]  DATE STARTED: 06/26/2024 18:20 DATE ENDED: 06/27/2024 07:38  HISTORY: 48 y.o. female who is presenting to [facility] with Hypertensive emergency, in the setting of the following pertinent/contributing co-morbidities: ESRD on dialysis (MWF), T2DM, Atrial fibrillation, Anemia of chronic disease. She is s/p nitro gtt in the MICU transitioned to PO anti-HTN.  Encephalopathy.  Evaluate for seizures.  PROCEDURE: Continuous EEG with simultaneous video recording was performed utilizing 21 active electrodes placed according to the international 10-20 system.  The study was recorded digitally with a bandpass of 1-70Hz  and a sampling rate of 200Hz  and was reviewed with the possibility of multiple reformatting.  The study was digitally processed with potential spike and seizure events identified for physician analysis and review.  Patient recognized events were identified by a push button marker and reviewed by the physician.   Simultaneous video was reviewed for all patient events.  TECHNICAL DESCRIPTION: A posterior dominant rhythm is not seen.  Background activities are continuous and comprised primarily of theta and delta range frequencies.  There are transitions to the sleep state.   No interictal abnormalities are seen.  No electrographic event suggestive of a seizure is seen.  IMPRESSION: This EEG study is abnormal secondary to moderate diffuse slowing.  CLINICAL INTERPRETATION: Diffuse slowing is indicative of bihemispheric dysfunction which could be on the basis of a toxic, metabolic, or primary neuronal disorder.    CT Head Wo Contrast Result Date: 06/26/2024 EXAM: Computed tomography, head or brain without contrast material. ACCESSION: [id number] UN CLINICAL INDICATION: 48 years old Female with Altered mental status, hx CVA  COMPARISON: None TECHNIQUE: Axial CT images of the head from skull base to vertex without contrast. FINDINGS: Mild frontal predominant cerebral volume loss. There are scattered and confluent hypodense foci within the periventricular and deep white matter, nonspecific, could reflect advanced for age small vessel ischemic changes. Probable small chronic infarcts in the bilateral corona radiata and periventricular white matter. There is no midline shift. No mass lesion. There is no evidence of acute infarct. Mucous retention cyst in the right maxillary sinus. Mucosal thickening of the left frontal sinus and frontal recess. Otherwise, the visualized sinuses are pneumatized. Chronic fracturing deformity of the left lamina papyracea. No intracranial hemorrhage or skull fractures.   No acute appearing intracranial abnormalities. Patchy confluent white matter hypodensities with probable chronic infarcts in the bilateral corona radiata and periventricular white matter, nonspecific, may reflect advanced for age chronic small vessel ischemic changes or chronic hypertensive encephalopathy. Consider further evaluation with MRI brain, if clinically warranted.   ECG 12 Lead Result Date: 06/22/2024 NORMAL SINUS RHYTHM LEFT AXIS DEVIATION MODERATE VOLTAGE CRITERIA FOR LVH, MAY BE NORMAL VARIANT ( R in aVL , Cornell product ) ST & T WAVE ABNORMALITY, CONSIDER LATERAL ISCHEMIA  QTcFram >= 480 msec ABNORMAL ECG WHEN COMPARED WITH ECG OF 22-Jun-2024 06:23, VENT. RATE HAS DECREASED by  46 bpm T WAVE INVERSION NOW EVIDENT IN ANTERIOR LEADS Confirmed by Claudene Legions ([id number]) on 06/22/2024 2:15:43 PM  ECG 12 Lead Result Date: 06/22/2024 SUPRAVENTRICULAR TACHYCARDIA LEFT AXIS DEVIATION MINIMAL VOLTAGE CRITERIA FOR LVH, MAY BE NORMAL VARIANT ( Cornell product ) T WAVE ABNORMALITY, CONSIDER LATERAL ISCHEMIA ABNORMAL ECG  ECG 12 Lead Result Date: 06/22/2024 ** AGE AND GENDER SPECIFIC ECG ANALYSIS ** SINUS TACHYCARDIA LEFT ANTERIOR FASCICULAR BLOCK MODERATE VOLTAGE CRITERIA FOR LVH, MAY BE NORMAL VARIANT ( R in aVL , Cornell product ) ST ELEVATION, CONSIDER ANTERIOR INJURY OR ACUTE INFARCT ** ** ACUTE MI / STEMI ** ** ABNORMAL ECG  CTA Chest W Contrast Result Date: 06/22/2024 EXAM: CTA CHEST W CONTRAST ACCESSION: [id number] [facility] REPORT DATE: 06/22/2024 6:13 AM CLINICAL INDICATION: Significant tachycardia, respiratory distress in the setting of pulmonary edema, dialysis patient, D - dimer elevated TECHNIQUE: Contiguous axial images were reconstructed through the chest following a single breath hold helical acquisition during the administration of intravenous contrast material. Images  were reformatted in the coronal and sagittal planes. MIP slabs were also constructed. COMPARISON: XR CHEST PORTABLE 06/22/2024 FINDINGS: PULMONARY ARTERIES: No embolus in either lung. Main pulmonary artery is normal in size. HEART/VASCULATURE: Borderline multichamber cardiac enlargement. Trace/small pericardial effusion (6:102). Aorta is normal in caliber. LUNGS/AIRWAYS/PLEURA: Trachea and large airways are patent. Bilateral smooth interlobular septal thickening. Diffuse groundglass opacities throughout the right and left lung. Small bilateral pleural effusions. No pneumothorax. MEDIASTINUM/THORACIC INLET: No enlarged supraclavicular or intrathoracic lymph nodes. No mediastinal mass or thyroid abnormality. UPPER  ABDOMEN: Normal. CHEST WALL/BONES: No enlarged axillary lymph nodes. Body wall edema. Right breast calcifications (6:41) DEVICES: Partially visualized right subclavian hemodialysis catheter.   1.  No pulmonary embolism. 2.  Pulmonary changes likely relate to severe edema. 3.  Small effusions reside within the dependent aspect of the pleural spaces.  XR Chest Portable Result Date: 06/22/2024 EXAM: XR CHEST PORTABLE ACCESSION: [id number] [facility] REPORT DATE: 06/22/2024 5:02 AM CLINICAL INDICATION: DYSPNEA  TECHNIQUE: Single View AP Chest Radiograph. COMPARISON: Chest x-ray 10/27/2023 FINDINGS: Diffuse multilobar interstitial and perihilar consolidative opacities. No pleural effusion or pneumothorax. Enlarged cardiopericardial silhouette.   1.  Pulmonary edema. 2.  Cardiomegaly.  ______________________________________________________________________ Discharge Instructions:  Activity Instructions     Activity as tolerated           Resources and Referrals     Commode     Length of Need: 99 months   Type of commode: Chair   Advertising Copywriter (DME)     Length of Need: 99 months   Tub-Transfer Arts Administrator     Type: Standard   Seat: 18x16   Cushion: Gel   Footrests: Swing away   Options: Removable Armrest   Length of Need: 99 month   Manual wheel chair with removable/swing away arm & foot rests for PT  Height: 167.6 cm (5' 6)   Weight: 52.7 kg (116 lb 2.9 oz)      Manual wheel chair with removable/swing away arm & foot rests for PT  Height: 167.6 cm (5' 6)   Weight: 52.7 kg (116 lb 2.9 oz)        Follow Up instructions and Outpatient Referrals    Ambulatory Referral to Home Health     Reason for referral: PT/OT recommendation   Physician to follow patient's care: PCP   Disciplines requested:  Physical Therapy Occupational Therapy Home Health Aide     Physical Therapy requested: Evaluate and treat   Occupational Therapy Requested: Evaluate  and treat   Requested follow up plan: I would resume responsibility.   Call MD for:  persistent nausea or vomiting     Call MD for:  severe uncontrolled pain     Call MD for: Temperature > 38.5 Celsius ( > 101.3 Fahrenheit)     Discharge instructions        ______________________________________________________________________ Discharge Day Services: BP 101/67   Pulse 88   Temp 36.3 C (97.3 F) (Oral)   Resp 16   Ht 167.6 cm (5' 6)   Wt 52.7 kg (116 lb 2.9 oz)   SpO2 100%   BMI 18.76 kg/m   Pt seen on the day of discharge and determined appropriate for discharge.  Condition at Discharge: stable  Length of Discharge: I spent greater than 30 mins in the discharge of this patient.

## 2024-07-09 ENCOUNTER — Telehealth: Payer: Self-pay | Admitting: *Deleted

## 2024-07-09 NOTE — Transitions of Care (Post Inpatient/ED Visit) (Signed)
   07/09/2024  Name: Stacey Lucas MRN: [id number] DOB: 01-Jul-1976  Today's TOC FU Call Status: Today's TOC FU Call Status:: Unsuccessful Call (1st Attempt) Unsuccessful Call (1st Attempt) Date: 07/09/24  Attempted to reach the patient regarding the most recent Inpatient/ED visit.  Follow Up Plan: Additional outreach attempts will be made to reach the patient to complete the Transitions of Care (Post Inpatient/ED visit) call.   Andrea Dimes RN, BSN [place]  [facility] RN Care Manager [phone]

## 2024-07-12 ENCOUNTER — Telehealth: Payer: Self-pay | Admitting: *Deleted

## 2024-07-12 NOTE — Care Plan (Signed)
 Transition of Care Encounter Data   Call attempt: 2 Admission date: 06/22/24 Discharge date: 07/08/24 Discharge diagnosis: Hypertensive emergency Patient post discharge: Medications:      SABRA   [facility]: [phone]:  .  [facility]: [phone]:  .  Other: Contact PCP:        [facility] TRANSITIONAL CASE MANAGEMENT SUMMARY NOTE   Attempted to contact patient today at Home to complete Transitional Case Management call from [facility]. Left message for patient to return call; direct phone number included in message left for patient; 2nd attempt.           Harlene JINNY Beets, RN

## 2024-07-12 NOTE — Transitions of Care (Post Inpatient/ED Visit) (Signed)
   07/12/2024  Name: Stacey [facility] MRN: [id number] DOB: June 26, 1976  Today's TOC FU Call Status: Today's TOC FU Call Status:: Unsuccessful Call (2nd Attempt) Unsuccessful Call (2nd Attempt) Date: 07/12/24  Attempted to reach the patient regarding the most recent Inpatient/ED visit.  Follow Up Plan: Additional outreach attempts will be made to reach the patient to complete the Transitions of Care (Post Inpatient/ED visit) call.   Andrea Dimes RN, BSN [place]  [facility] RN Care Manager [phone]

## 2024-07-24 NOTE — ED Triage Notes (Signed)
 Generalized abdominal pain x 1 months. Last BM 1.5 weeks ago.

## 2024-07-25 NOTE — ED Provider Notes (Signed)
 [facility] Emergency Department Provider Note  ED Clinical Impression   Final diagnoses:  Fecal impaction in rectum    (CMS-HCC) (Primary)  Stercoral colitis    ED Initial Assessment & ED Course   48 year old female with PMHx ESRD on iHD (MWF), HTN, T2DM, Afib, CVA presented with three days of constant, diffuse abdominal pain, worse in the right upper and lower quadrants, and a history of severe constipation with minimal bowel movements.   Vitals:   07/24/24 1904 07/25/24 0005 07/25/24 0217  BP: 124/68 127/95   Pulse: 104 92 87  Resp: 16 17 17   Temp: 37.1 C (98.8 F)    TempSrc: Temporal    SpO2: 100% 100% 100%  Weight: 68 kg (150 lb)    Height: 165.1 cm (5' 5)      Medical Decision Making Exam revealed a hard, tender abdomen without distension, rebound, or guarding. Labs showed mild leukocytosis, elevated lipase, and mildly elevated liver enzymes; abdominal x-ray demonstrated moderate colonic stool burden and nonobstructive bowel gas pattern.  Differential diagnosis includes, but is not limited to: - Severe constipation with colonic stool burden: Most likely etiology given history, exam, and imaging findings of significant stool burden and chronic constipation. - Bowel obstruction: Considered due to abdominal pain and constipation, but x-ray showed nonobstructive bowel gas pattern. - Appendicitis: Considered due to abdominal pain, but no focal tenderness, guarding, or imaging findings suggestive of appendicitis. - Cholecystitis: Considered due to right upper quadrant pain and mildly elevated liver enzymes, but no imaging or exam findings suggestive of cholecystitis. - Pancreatitis: Considered due to abdominal pain and mildly elevated lipase, but no imaging findings or classic clinical features. - IBS or IBD: Considered as alternative etiologies for chronic abdominal pain and altered bowel habits, but less likely given imaging findings.  Severe constipation with colonic  stool burden and diffuse abdominal pain - Order CT scan of abdomen and pelvis without contrast to assess for other etiologies. - Administered Dilaudid  0.5 mg IV for pain management. - Administer enema if CT scan is consistent with constipation and colonic stool burden.  ED Course: ED Course as of 07/25/24 0421  Sun Jul 25, 2024  0230 Patient still in severe pain so we will give another dose of Dilaudid  0.5 mg IV  0342 CT Abdomen Pelvis Wo Contrast   IMPRESSION: Findings suggestive of mild stercoral colitis with rectal fecaloma measuring up to 5.7 cm.  0342 I gave Versed  2 mg and then performed a fecal disimpaction.  Patient tolerated well.  Will give soapsuds enema.  9579 Patient was able to have a small soft bowel movement.    ED Final Assessment & Plan   Patient with fecal impaction and mild stercoral colitis as a likely cause of her abdominal pain.  She tolerated a fecal disimpaction well and was able to have a soft bowel movement after receiving an enema.  Will discharge with bowel regimen of MiraLAX  and senna.  I gave strict return precautions.  Presribed Meds: New Prescriptions   No medications on file    MDM/Critical Care/Procedure Documentation   MDM     Independent interpretation: X-ray(s) - as above CT scan(s) - as above  Escalation of Care including OBS/Admission/Transfer was considered: However, patient was determined to be appropriate for outpatient management. See progress note for additional detail.     Critical Care/Procedure Documentation Procedures  Portions of this record have been created using Scientist, clinical (histocompatibility and immunogenetics). Dictation errors have been sought, but may not have been identified  and corrected.  See chart and resident provider documentation for details.  ____________________________________________   History   Chief Complaint  Abdominal Pain   HPI   History of Present Illness Stacey Lucas is a 48 year old female w/ PMHx ESRD on  iHD (MWF), HTN, T2DM, Afib, CVA  with chronic constipation who presents with severe abdominal pain.  She has been experiencing diffuse abdominal pain for the past three days, described as constant and severe, with a pain level of ten out of ten. The pain is localized to her entire abdomen without radiation to the back. No fever, chills, nausea, or vomiting are present.  She has a history of chronic constipation, noting that she has not had a bowel movement for a week and a half, which she considers normal. She often strains during bowel movements and sometimes does not have a bowel movement even once a week. She does not take any stool softeners or laxatives.  She is on dialysis and urinates approximately once a day without pain. She denies any history of abdominal surgeries, including appendectomy or cholecystectomy.  No recent surgeries, fever, chills, nausea, vomiting, or urinary pain. She has not had a menstrual period in about three years.  Past Medical History[1]  Past Surgical History[2]  No current facility-administered medications for this encounter.  Current Outpatient Medications:  .  apixaban  (ELIQUIS ) 2.5 mg Tab, Take 1 tablet (2.5 mg total) by mouth two (2) times a day., Disp: 180 tablet, Rfl: 3 .  atorvastatin  (LIPITOR ) 40 MG tablet, Take 1 tablet (40 mg total) by mouth daily., Disp: 90 tablet, Rfl: 0 .  melatonin 3 mg Tab, Take 2 tablets (6 mg total) by mouth every evening., Disp: , Rfl:  .  metoPROLOL  tartrate (LOPRESSOR ) 25 MG tablet, Take 0.25 (one-fourth) tablet (6.25 mg total) by mouth two (2) times a day., Disp: 45 tablet, Rfl: 0 .  multivitamins, therapeutic with minerals 9 mg iron -400 mcg tablet, Take 1 tablet by mouth daily., Disp: , Rfl:  .  nicotine  (NICODERM CQ ) 14 mg/24 hr patch, Place 1 patch on the skin daily., Disp: , Rfl:  .  sevelamer  (RENVELA ) 800 mg tablet, Take 1 tablet (800 mg total) by mouth Three (3) times a day with a meal., Disp: 90 tablet, Rfl: 2 .   thiamine (B-1) 100 MG tablet, Take 1 tablet (100 mg total) by mouth daily., Disp: 100 tablet, Rfl: 0  Allergies Gabapentin   Family History[3]  Social History Short Social History[4]  Review of Systems Negative other than stated in HPI  Physical Exam   ED Triage Vitals [07/24/24 1904]  Enc Vitals Group     BP 124/68     Pulse 104     SpO2 Pulse      Resp 16     Temp 37.1 C (98.8 F)     Temp Source Temporal     SpO2 100 %     Weight 68 kg (150 lb)     Height 1.651 m (5' 5)   Physical Exam Vitals reviewed.  Cardiovascular:     Rate and Rhythm: Normal rate.  Pulmonary:     Effort: Pulmonary effort is normal.  Abdominal:     General: Abdomen is flat. Bowel sounds are normal.     Palpations: Abdomen is soft.     Tenderness: There is generalized abdominal tenderness. There is no guarding or rebound.     Comments: Diffuse abdominal pain, worse in right upper and lower quadrants. Abdomen hard,  not distended, no rebound or guarding.   Genitourinary:    Comments: Hard stool in the rectal vault Neurological:     Mental Status: She is alert.      Labs   Results for orders placed or performed during the hospital encounter of 07/24/24  Comprehensive Metabolic Panel  Result Value Ref Range   Sodium 135 135 - 145 mmol/L   Potassium 4.1 3.5 - 5.0 mmol/L   Chloride 96 (L) 98 - 107 mmol/L   CO2 24.0 22.0 - 32.0 mmol/L   Anion Gap 15 (H) 5 - 14 mmol/L   BUN 50 (H) 7 - 21 mg/dL   Creatinine 2.09 (H) 9.39 - 1.00 mg/dL   BUN/Creatinine Ratio 6    eGFR CKD-EPI (2021) Female 6 (L) >=60 mL/min/1.65m2   Glucose 283 (H) 74 - 106 mg/dL   Calcium  9.3 8.5 - 10.2 mg/dL   Albumin  5.0 3.4 - 5.0 g/dL   Total Protein 9.5 (H) 6.5 - 8.3 g/dL   Total Bilirubin 1.0 0.1 - 1.2 mg/dL   AST 51 (H) 14 - 38 U/L   ALT 32 <35 U/L   Alkaline Phosphatase 158 (H) 38 - 126 U/L  Lipase Level  Result Value Ref Range   Lipase 273 (H) 44 - 232 U/L  hCG, Quantitative, Pregnancy  Result Value Ref  Range   hCG Quant, Blood 7.7 (H) <5.0 IU/L  CBC w/ Differential  Result Value Ref Range   WBC 12.7 (H) 3.6 - 11.2 10*9/L   RBC 4.80 3.95 - 5.13 10*12/L   HGB 12.5 11.3 - 14.9 g/dL   HCT 60.7 65.9 - 55.9 %   MCV 81.8 77.6 - 95.7 fL   MCH 26.2 25.9 - 32.4 pg   MCHC 32.0 32.0 - 36.0 g/dL   RDW 81.1 (H) 87.7 - 84.7 %   MPV 10.4 6.8 - 10.7 fL   Platelet 177 150 - 450 10*9/L   nRBC 0 <=4 /100 WBCs   Neutrophils % 81.7 %   Lymphocytes % 10.8 %   Monocytes % 5.5 %   Eosinophils % 1.6 %   Basophils % 0.4 %   Absolute Neutrophils 10.3 (H) 1.8 - 7.8 10*9/L   Absolute Lymphocytes 1.4 1.1 - 3.6 10*9/L   Absolute Monocytes 0.7 0.3 - 0.8 10*9/L   Absolute Eosinophils 0.2 0.0 - 0.5 10*9/L   Absolute Basophils 0.1 0.0 - 0.1 10*9/L    Radiology   CT Abdomen Pelvis Wo Contrast  Final Result  Findings suggestive of mild stercoral colitis with rectal fecaloma measuring up to 5.7 cm.      XR Abdomen 1 View  Final Result  Nonobstructive bowel gas pattern. Moderate volume chronic stool burden.       I independently reviewed and interpreted the radiology images.          [1] Past Medical History: Diagnosis Date  . Diabetes mellitus (CMS-HCC)   . Gout   [2] Past Surgical History: Procedure Laterality Date  . TUBAL LIGATION    [3] History reviewed. No pertinent family history. [4] Social History Tobacco Use  . Smoking status: Every Day    Current packs/day: 1.00    Types: Cigarettes  . Tobacco comments:    Down from 1ppd to 4cpd; wants to quit   Vaping Use  . Vaping status: Never Used  Substance Use Topics  . Alcohol use: Yes    Comment: weekends  . Drug use: No   Polly Cordella RAMAN, MD 07/25/24 [phone]

## 2024-08-06 ENCOUNTER — Encounter (HOSPITAL_COMMUNITY): Payer: Self-pay | Admitting: Family Medicine

## 2024-08-06 ENCOUNTER — Emergency Department (HOSPITAL_COMMUNITY)

## 2024-08-06 ENCOUNTER — Other Ambulatory Visit: Payer: Self-pay

## 2024-08-06 ENCOUNTER — Inpatient Hospital Stay (HOSPITAL_COMMUNITY)
Admission: EM | Admit: 2024-08-06 | Discharge: 2024-08-14 | DRG: 682 | Disposition: A | Discharge status: Skilled Nursing Facility | Attending: Internal Medicine | Admitting: Internal Medicine

## 2024-08-06 DIAGNOSIS — E875 Hyperkalemia: Secondary | ICD-10-CM | POA: Diagnosis present

## 2024-08-06 DIAGNOSIS — Z992 Dependence on renal dialysis: Secondary | ICD-10-CM

## 2024-08-06 DIAGNOSIS — N186 End stage renal disease: Secondary | ICD-10-CM

## 2024-08-06 DIAGNOSIS — E119 Type 2 diabetes mellitus without complications: Secondary | ICD-10-CM

## 2024-08-06 DIAGNOSIS — D649 Anemia, unspecified: Secondary | ICD-10-CM | POA: Diagnosis present

## 2024-08-06 DIAGNOSIS — R9431 Abnormal electrocardiogram [ECG] [EKG]: Secondary | ICD-10-CM | POA: Diagnosis present

## 2024-08-06 DIAGNOSIS — I639 Cerebral infarction, unspecified: Secondary | ICD-10-CM | POA: Diagnosis present

## 2024-08-06 DIAGNOSIS — I480 Paroxysmal atrial fibrillation: Secondary | ICD-10-CM | POA: Diagnosis present

## 2024-08-06 DIAGNOSIS — Z91158 Patient's noncompliance with renal dialysis for other reason: Principal | ICD-10-CM

## 2024-08-06 DIAGNOSIS — Z8673 Personal history of transient ischemic attack (TIA), and cerebral infarction without residual deficits: Secondary | ICD-10-CM | POA: Diagnosis not present

## 2024-08-06 DIAGNOSIS — F331 Major depressive disorder, recurrent, moderate: Secondary | ICD-10-CM

## 2024-08-06 LAB — CBC
HCT: 29.2 % — ABNORMAL LOW (ref 36.0–46.0)
HCT: 27.1 % — ABNORMAL LOW (ref 36.0–46.0)
Hemoglobin: 9.6 g/dL — ABNORMAL LOW (ref 12.0–15.0)
Hemoglobin: 9.1 g/dL — ABNORMAL LOW (ref 12.0–15.0)
MCH: 26.5 pg (ref 26.0–34.0)
MCH: 26.5 pg (ref 26.0–34.0)
MCHC: 32.9 g/dL (ref 30.0–36.0)
MCHC: 33.6 g/dL (ref 30.0–36.0)
MCV: 80.7 fL (ref 80.0–100.0)
MCV: 78.8 fL — ABNORMAL LOW (ref 80.0–100.0)
Platelets: 271 K/uL (ref 150–400)
Platelets: 262 K/uL (ref 150–400)
RBC: 3.62 MIL/uL — ABNORMAL LOW (ref 3.87–5.11)
RBC: 3.44 MIL/uL — ABNORMAL LOW (ref 3.87–5.11)
RDW: 16.7 % — ABNORMAL HIGH (ref 11.5–15.5)
RDW: 16.7 % — ABNORMAL HIGH (ref 11.5–15.5)
WBC: 10 K/uL (ref 4.0–10.5)
WBC: 9.6 K/uL (ref 4.0–10.5)
nRBC: 0 % (ref 0.0–0.2)
nRBC: 0 % (ref 0.0–0.2)

## 2024-08-06 LAB — RETICULOCYTES
Immature Retic Fract: 6.6 % (ref 2.3–15.9)
RBC.: 3.37 MIL/uL — ABNORMAL LOW (ref 3.87–5.11)
Retic Count, Absolute: 18.9 K/uL — ABNORMAL LOW (ref 19.0–186.0)
Retic Ct Pct: 0.6 % (ref 0.4–3.1)

## 2024-08-06 LAB — BASIC METABOLIC PANEL WITH GFR
Anion gap: 22 — ABNORMAL HIGH (ref 5–15)
Anion gap: 22 — ABNORMAL HIGH (ref 5–15)
BUN: 126 mg/dL — ABNORMAL HIGH (ref 6–20)
BUN: 127 mg/dL — ABNORMAL HIGH (ref 6–20)
CO2: 19 mmol/L — ABNORMAL LOW (ref 22–32)
CO2: 19 mmol/L — ABNORMAL LOW (ref 22–32)
Calcium: 6.8 mg/dL — ABNORMAL LOW (ref 8.9–10.3)
Calcium: 6.6 mg/dL — ABNORMAL LOW (ref 8.9–10.3)
Chloride: 99 mmol/L (ref 98–111)
Chloride: 101 mmol/L (ref 98–111)
Creatinine, Ser: 16.38 mg/dL — ABNORMAL HIGH (ref 0.44–1.00)
Creatinine, Ser: 16.26 mg/dL — ABNORMAL HIGH (ref 0.44–1.00)
GFR, Estimated: 2 mL/min — ABNORMAL LOW (ref >60)
GFR, Estimated: 2 mL/min — ABNORMAL LOW (ref >60)
Glucose, Bld: 180 mg/dL — ABNORMAL HIGH (ref 70–99)
Glucose, Bld: 209 mg/dL — ABNORMAL HIGH (ref 70–99)
Potassium: 5.9 mmol/L — ABNORMAL HIGH (ref 3.5–5.1)
Potassium: 5.4 mmol/L — ABNORMAL HIGH (ref 3.5–5.1)
Sodium: 140 mmol/L (ref 135–145)
Sodium: 142 mmol/L (ref 135–145)

## 2024-08-06 LAB — CBG MONITORING, ED: Glucose-Capillary: 237 mg/dL — ABNORMAL HIGH (ref 70–99)

## 2024-08-06 LAB — FERRITIN: Ferritin: 773 ng/mL — ABNORMAL HIGH (ref 11–307)

## 2024-08-06 LAB — MRSA NEXT GEN BY PCR, NASAL: MRSA by PCR Next Gen: NOT DETECTED

## 2024-08-06 LAB — IRON AND TIBC
Iron: 126 ug/dL (ref 28–170)
Saturation Ratios: 63 % — ABNORMAL HIGH (ref 10.4–31.8)
TIBC: 200 ug/dL — ABNORMAL LOW (ref 250–450)
UIBC: 74 ug/dL

## 2024-08-06 LAB — FOLATE: Folate: 13.4 ng/mL (ref >5.9)

## 2024-08-06 LAB — GLUCOSE, CAPILLARY
Glucose-Capillary: 283 mg/dL — ABNORMAL HIGH (ref 70–99)
Glucose-Capillary: 216 mg/dL — ABNORMAL HIGH (ref 70–99)
Glucose-Capillary: 296 mg/dL — ABNORMAL HIGH (ref 70–99)
Glucose-Capillary: 181 mg/dL — ABNORMAL HIGH (ref 70–99)

## 2024-08-06 LAB — HIV ANTIBODY (ROUTINE TESTING W REFLEX): HIV Screen 4th Generation wRfx: NONREACTIVE

## 2024-08-06 LAB — PHOSPHORUS: Phosphorus: 8.4 mg/dL — ABNORMAL HIGH (ref 2.5–4.6)

## 2024-08-06 LAB — VITAMIN B12: Vitamin B-12: 633 pg/mL (ref 180–914)

## 2024-08-06 LAB — HEPATITIS B SURFACE ANTIGEN: Hepatitis B Surface Ag: NONREACTIVE

## 2024-08-06 LAB — TROPONIN I (HIGH SENSITIVITY)
Troponin I (High Sensitivity): 26 ng/L — ABNORMAL HIGH (ref <18)
Troponin I (High Sensitivity): 27 ng/L — ABNORMAL HIGH (ref <18)

## 2024-08-06 MED ORDER — HYDRALAZINE HCL 25 MG PO TABS
25.0000 mg | ORAL_TABLET | Freq: Two times a day (BID) | ORAL | Status: DC
  Administered 2024-08-06 – 2024-08-14 (×12): 25 mg via ORAL
  Filled 2024-08-06 (×14): qty 1

## 2024-08-06 MED ORDER — INSULIN ASPART 100 UNIT/ML IJ SOLN
0.0000 [IU] | Freq: Three times a day (TID) | INTRAMUSCULAR | Status: DC
  Administered 2024-08-06: 2 [IU] via SUBCUTANEOUS
  Administered 2024-08-06 (×2): 3 [IU] via SUBCUTANEOUS
  Administered 2024-08-07: 2 [IU] via SUBCUTANEOUS
  Administered 2024-08-07: 1 [IU] via SUBCUTANEOUS
  Administered 2024-08-07: 2 [IU] via SUBCUTANEOUS
  Administered 2024-08-08: 3 [IU] via SUBCUTANEOUS
  Administered 2024-08-08: 5 [IU] via SUBCUTANEOUS
  Administered 2024-08-09 (×2): 2 [IU] via SUBCUTANEOUS
  Administered 2024-08-10 (×2): 1 [IU] via SUBCUTANEOUS
  Administered 2024-08-10: 2 [IU] via SUBCUTANEOUS
  Administered 2024-08-11: 4 [IU] via SUBCUTANEOUS
  Administered 2024-08-12: 2 [IU] via SUBCUTANEOUS
  Administered 2024-08-12: 1 [IU] via SUBCUTANEOUS
  Administered 2024-08-13: 2 [IU] via SUBCUTANEOUS
  Filled 2024-08-06: qty 3
  Filled 2024-08-06 (×2): qty 1
  Filled 2024-08-06 (×2): qty 2
  Filled 2024-08-06: qty 3
  Filled 2024-08-06 (×2): qty 2
  Filled 2024-08-06: qty 3
  Filled 2024-08-06: qty 1
  Filled 2024-08-06: qty 5
  Filled 2024-08-06 (×2): qty 2
  Filled 2024-08-06: qty 1
  Filled 2024-08-06 (×2): qty 2
  Filled 2024-08-06: qty 4

## 2024-08-06 MED ORDER — DEXTROSE 50 % IV SOLN
1.0000 | Freq: Once | INTRAVENOUS | Status: AC
  Administered 2024-08-06: 50 mL via INTRAVENOUS
  Filled 2024-08-06: qty 50

## 2024-08-06 MED ORDER — INSULIN ASPART 100 UNIT/ML IV SOLN
5.0000 [IU] | Freq: Once | INTRAVENOUS | Status: AC
  Administered 2024-08-06: 5 [IU] via INTRAVENOUS
  Filled 2024-08-06: qty 5

## 2024-08-06 MED ORDER — SODIUM ZIRCONIUM CYCLOSILICATE 10 G PO PACK
10.0000 g | PACK | Freq: Once | ORAL | Status: AC
  Administered 2024-08-06: 10 g via ORAL
  Filled 2024-08-06: qty 1

## 2024-08-06 MED ORDER — SENNA 8.6 MG PO TABS
1.0000 | ORAL_TABLET | Freq: Every day | ORAL | Status: DC | PRN
  Administered 2024-08-06 – 2024-08-08 (×2): 8.6 mg via ORAL
  Filled 2024-08-06 (×2): qty 1

## 2024-08-06 MED ORDER — AMLODIPINE BESYLATE 10 MG PO TABS
10.0000 mg | ORAL_TABLET | Freq: Every day | ORAL | Status: DC
  Administered 2024-08-06 – 2024-08-14 (×7): 10 mg via ORAL
  Filled 2024-08-06 (×7): qty 1

## 2024-08-06 MED ORDER — CHLORHEXIDINE GLUCONATE CLOTH 2 % EX PADS: 6.0000 | MEDICATED_PAD | Freq: Every day | CUTANEOUS | Status: DC

## 2024-08-06 MED ORDER — ACETAMINOPHEN 650 MG RE SUPP: 650.0000 mg | Freq: Four times a day (QID) | RECTAL | Status: DC | PRN

## 2024-08-06 MED ORDER — ACETAMINOPHEN 325 MG PO TABS
650.0000 mg | ORAL_TABLET | Freq: Four times a day (QID) | ORAL | Status: DC | PRN
  Administered 2024-08-06 – 2024-08-13 (×9): 650 mg via ORAL
  Filled 2024-08-06 (×7): qty 2

## 2024-08-06 MED ORDER — SODIUM CHLORIDE 0.9% FLUSH
3.0000 mL | Freq: Two times a day (BID) | INTRAVENOUS | Status: DC
  Administered 2024-08-06 – 2024-08-14 (×18): 3 mL via INTRAVENOUS

## 2024-08-06 MED ORDER — ACETAMINOPHEN 325 MG PO TABS
ORAL_TABLET | ORAL | Status: AC
  Filled 2024-08-06: qty 2

## 2024-08-06 MED ORDER — CARVEDILOL 6.25 MG PO TABS
6.2500 mg | ORAL_TABLET | Freq: Two times a day (BID) | ORAL | Status: DC
  Administered 2024-08-06 – 2024-08-14 (×13): 6.25 mg via ORAL
  Filled 2024-08-06 (×13): qty 1

## 2024-08-06 MED ORDER — APIXABAN 2.5 MG PO TABS
2.5000 mg | ORAL_TABLET | Freq: Two times a day (BID) | ORAL | Status: DC
  Administered 2024-08-06 – 2024-08-14 (×17): 2.5 mg via ORAL
  Filled 2024-08-06 (×17): qty 1

## 2024-08-06 MED ORDER — INSULIN ASPART 100 UNIT/ML IJ SOLN
0.0000 [IU] | Freq: Every day | INTRAMUSCULAR | Status: DC
  Administered 2024-08-08: 2 [IU] via SUBCUTANEOUS
  Administered 2024-08-09 – 2024-08-10 (×2): 3 [IU] via SUBCUTANEOUS
  Administered 2024-08-11 – 2024-08-12 (×2): 2 [IU] via SUBCUTANEOUS
  Filled 2024-08-06: qty 3
  Filled 2024-08-06 (×2): qty 2
  Filled 2024-08-06: qty 3
  Filled 2024-08-06: qty 2

## 2024-08-06 MED ORDER — CHLORHEXIDINE GLUCONATE CLOTH 2 % EX PADS
6.0000 | MEDICATED_PAD | Freq: Every day | CUTANEOUS | Status: DC
  Administered 2024-08-06 – 2024-08-14 (×7): 6 via TOPICAL

## 2024-08-06 MED ORDER — PENTAFLUOROPROP-TETRAFLUOROETH EX AERO: 1.0000 | INHALATION_SPRAY | CUTANEOUS | Status: DC | PRN

## 2024-08-06 MED ORDER — ATORVASTATIN CALCIUM 40 MG PO TABS
40.0000 mg | ORAL_TABLET | Freq: Every day | ORAL | Status: DC
  Administered 2024-08-06 – 2024-08-14 (×9): 40 mg via ORAL
  Filled 2024-08-06 (×9): qty 1

## 2024-08-06 NOTE — TOC CAGE-AID Note (Signed)
 Transition of Care [facility]) - CAGE-AID Screening   Patient Details  Name: Stacey Lucas MRN: [id number] Date of Birth: Dec 01, 1975  Transition of Care [facility]) CM/SW Contact:    Landry DELENA Senters, RN Phone Number: 08/06/2024, 3:30 PM   Clinical Narrative:  Patient reports alcohol consumption but denies any need for inpatient or outpatient counseling resources.   CAGE-AID Screening: Substance Abuse Screening unable to be completed due to: : Patient Refused  Have You Ever Felt You Ought to Cut Down on Your Drinking or Drug Use?: No Have People Annoyed You By Critizing Your Drinking Or Drug Use?: No Have You Felt Bad Or Guilty About Your Drinking Or Drug Use?: No Have You Ever Had a Drink or Used Drugs First Thing In The Morning to Steady Your Nerves or to Get Rid of a Hangover?: No CAGE-AID Score: 0  Substance Abuse Education Offered: Yes

## 2024-08-06 NOTE — Procedures (Signed)
 HD Note:  Some information was entered later than the data was gathered due to patient care needs. The stated time with the data is accurate.  Received patient in bed to unit.   Alert and oriented.   Informed consent signed and in chart.   Access used: Left upper arm fistula Access issues: None  Patient tolerated treatment well.   TX duration: 2.5 hours  Alert, without acute distress.  Total UF removed: 1000 ml  Hand-off given to patient's nurse.   Transported back to the room   Naseem Adler L. Lenon, RN Kidney Dialysis Unit.

## 2024-08-06 NOTE — Plan of Care (Signed)

## 2024-08-06 NOTE — Consult Note (Signed)
 [facility] Renal Consultation Note    Indication for Consultation:  Management of ESRD/hemodialysis, anemia, hypertension/volume, and secondary hyperparathyroidism.  HPI: Stacey Lucas is a 48 y.o. female with PMH including ESRD on dialysis, A.fib, CVA, T2DM, HTN who presented to the ED needing dialysis. She reported she was living in [place] but is recently homeless and living in [place]. She was admitted to [facility] in October with hypertensive emergency and discharged to SNF. Reported to the ED provider that she had not been compliant with her medications or dialysis for 2 weeks. Labs notable for K+ 5.9, improved to 5.4 with lokelma . BUN 127, Cr 16.26, Hgb 9.1. CXR showed no acute cardiopulmonary pathology. BP moderately elevated. Pt seen in room. Reports she is feeling generally unwell. Reports fatigue, itching and muscle twitching. Denies SOB, orthopnea, CP, palpitations, dizziness, abdominal pain, N/V/D, dysuria, hematuria, fever, flank pain, chills.   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  12/29/2021   Anxiety and depression    Atrial fibrillation (HCC)    Cerebral thrombosis with cerebral infarction 05/24/2021   Cocaine abuse (HCC)    Depression with suicidal ideation    [facility] admission 04/2018   Diabetes mellitus without complication (HCC)    Type II   End stage kidney disease (HCC)    GERD (gastroesophageal reflux disease)    Hyperlipidemia    Hypertension    Impingement syndrome of right shoulder    Migraine headache    Osteoarthritis of right knee 05/24/2021   Pilonidal abscess 05/2013   Pyelonephritis    Right thyroid nodule    Sciatica    Past Surgical History:  Procedure Laterality Date   AV FISTULA PLACEMENT Left 03/17/2023   Procedure: LEFT ARM BRACHIOCEPHALIC ARTERIOVENOUS (AV) FISTULA CREATION;  Surgeon: Lanis Fonda BRAVO, MD;  Location[facility] OR;   Service: Vascular;  Laterality: Left;   BUBBLE STUDY  01/01/2022   Procedure: BUBBLE STUDY;  Surgeon: Alvan Ronal BRAVO, MD;  Location: [facility] ENDOSCOPY;  Service: Cardiovascular;;   IR FLUORO GUIDE CV LINE RIGHT  03/14/2023   IR US  GUIDE VASC ACCESS RIGHT  03/14/2023   TEE WITHOUT CARDIOVERSION N/A 01/01/2022   Procedure: TRANSESOPHAGEAL ECHOCARDIOGRAM (TEE);  Surgeon: Alvan Ronal BRAVO, MD;  Location: [facility] ENDOSCOPY;  Service: Cardiovascular;  Laterality: N/A;   tubal     TUBAL LIGATION     Family History  Problem Relation Age of Onset   Diabetes Mother    Hypertension Mother    Arthritis Mother    Diabetes Father    Hypertension Father    Social History:  reports that she has been smoking cigarettes. She has been exposed to tobacco smoke. She has never used smokeless tobacco. She reports current alcohol use. She reports that she does not use drugs.  ROS: As per HPI otherwise negative.   Physical Exam: Vitals:   08/06/24 0420 08/06/24 0422 08/06/24 0437 08/06/24 0741  BP:  (!) 149/85 (!) 149/85 (!) 154/71  Pulse:  (!) 104  (!) 106  Resp:  18  18  Temp:  98.2 F (36.8 C)  98.7 F (37.1 C)  TempSrc:  Oral    SpO2:  96%  97%  Weight: 64.5 kg     Height:         General: Well developed, well nourished, in no acute distress. Head: Normocephalic, atraumatic, sclera non-icteric, mucus membranes are moist. Lungs: Clear bilaterally to auscultation without  wheezes, rales, or rhonchi. Breathing is unlabored. Heart: RRR with normal S1, S2. No murmurs, rubs, or gallops appreciated. Abdomen: Soft, non-tender, non-distended with normoactive bowel sounds. No rebound/guarding. No obvious abdominal masses. Musculoskeletal:  Strength and tone appear normal for age. Lower extremities: No edema b/l lower extremities Neuro: Alert and oriented X 3. Moves all extremities spontaneously. Psych:  Responds to questions appropriately with a normal affect. Dialysis Access: AVF + t/b  Allergies  Allergen  Reactions   Neurontin  [Gabapentin ] Other (See Comments)    Weakness  Balance impairment    Prior to Admission medications   Medication Sig Start Date End Date Taking? Authorizing Provider  amLODipine  (NORVASC ) 10 MG tablet Take 1 tablet (10 mg total) by mouth daily. 11/12/23   Newlin, Enobong, MD  apixaban  (ELIQUIS ) 2.5 MG TABS tablet Take 1 tablet (2.5 mg total) by mouth 2 (two) times daily. 11/12/23   Newlin, Enobong, MD  atorvastatin  (LIPITOR ) 40 MG tablet Take 1 tablet (40 mg total) by mouth daily. 07/08/23   Krishnan, Gokul, MD  buPROPion  (WELLBUTRIN  XL) 150 MG 24 hr tablet Take 1 tablet (150 mg total) by mouth daily. 11/12/23   Newlin, Enobong, MD  carvedilol  (COREG ) 6.25 MG tablet Take 1 tablet (6.25 mg total) by mouth 2 (two) times daily with a meal. 11/12/23   Delbert Clam, MD  DULoxetine  (CYMBALTA ) 30 MG capsule Take 1 capsule (30 mg total) by mouth daily for chronic pain 11/12/23   Newlin, Enobong, MD  hydrALAZINE  (APRESOLINE ) 25 MG tablet Take 1 tablet (25 mg total) by mouth 2 (two) times daily. 02/17/24   Newlin, Enobong, MD  hydrOXYzine  (ATARAX ) 25 MG tablet Take 1 tablet (25 mg total) by mouth at bedtime as needed. 11/12/23   Newlin, Enobong, MD  nicotine  polacrilex (NICORETTE) 4 MG gum Take by mouth. 10/31/23   [provider]  ondansetron  (ZOFRAN -ODT) 4 MG disintegrating tablet Take 1 tablet (4 mg total) by mouth every 8 (eight) hours as needed for nausea or vomiting. 02/01/24   Raford Lenis, MD   Current Facility-Administered Medications  Medication Dose Route Frequency Provider Last Rate Last Admin   acetaminophen  (TYLENOL ) tablet 650 mg  650 mg Oral Q6H PRN Opyd, Timothy S, MD   650 mg at 08/06/24 9562   Or   acetaminophen  (TYLENOL ) suppository 650 mg  650 mg Rectal Q6H PRN Opyd, Timothy S, MD       amLODipine  (NORVASC ) tablet 10 mg  10 mg Oral Daily Opyd, Timothy S, MD       apixaban  (ELIQUIS ) tablet 2.5 mg  2.5 mg Oral BID Opyd, Timothy S, MD       atorvastatin  (LIPITOR )  tablet 40 mg  40 mg Oral Daily Opyd, Timothy S, MD       carvedilol  (COREG ) tablet 6.25 mg  6.25 mg Oral BID WC Opyd, Timothy S, MD       hydrALAZINE  (APRESOLINE ) tablet 25 mg  25 mg Oral BID Opyd, Timothy S, MD   25 mg at 08/06/24 9562   insulin  aspart (novoLOG ) injection 0-5 Units  0-5 Units Subcutaneous QHS Opyd, Timothy S, MD       insulin  aspart (novoLOG ) injection 0-6 Units  0-6 Units Subcutaneous TID WC Opyd, Timothy S, MD       senna (SENOKOT) tablet 8.6 mg  1 tablet Oral Daily PRN Opyd, Timothy S, MD   8.6 mg at 08/06/24 0437   sodium chloride  flush (NS) 0.9 % injection 3 mL  3 mL Intravenous Q12H  Charlton Evalene RAMAN, MD   3 mL at 08/06/24 0430   sodium zirconium cyclosilicate  (LOKELMA ) packet 10 g  10 g Oral Once Raenelle Donalda HERO, MD       Labs: Basic Metabolic Panel: Recent Labs  Lab 08/06/24 0106 08/06/24 0434  NA 140 142  K 5.9* 5.4*  CL 99 101  CO2 19* 19*  GLUCOSE 180* 209*  BUN 126* 127*  CREATININE 16.38* 16.26*  CALCIUM  6.8* 6.6*  PHOS  --  8.4*   Liver Function Tests: No results for input(s): AST, ALT, ALKPHOS, BILITOT, PROT, ALBUMIN  in the last 168 hours. No results for input(s): LIPASE, AMYLASE in the last 168 hours. No results for input(s): AMMONIA in the last 168 hours. CBC: Recent Labs  Lab 08/06/24 0106 08/06/24 0434  WBC 10.0 9.6  HGB 9.6* 9.1*  HCT 29.2* 27.1*  MCV 80.7 78.8*  PLT 271 262   CBG: Recent Labs  Lab 08/06/24 0340 08/06/24 0739  GLUCAP 237* 283*   Iron  Studies:  Recent Labs    08/06/24 0434  IRON  126  TIBC 200*  FERRITIN 773*   Studies/Results: DG Chest 2 View Result Date: 08/06/2024 EXAM: 2 VIEW(S) XRAY OF THE CHEST 08/06/2024 01:22:00 AM COMPARISON: Comparison with 05/26/2024. CLINICAL HISTORY: chest pain FINDINGS: LUNGS AND PLEURA: Lungs are clear. No pleural effusion. No pneumothorax. HEART AND MEDIASTINUM: Heart size and pulmonary vascularity are normal. Mediastinal contours appear intact. BONES AND  SOFT TISSUES: No acute osseous abnormality. IMPRESSION: 1. No acute cardiopulmonary pathology. Electronically signed by: Elsie Gravely MD 08/06/2024 01:32 AM EST RP Workstation: [id number]    Dialysis Orders: previously [facility], no HD unit now   Assessment/Plan:  Hyperkalemia: Improved with lokelma , planning for HD today.   ESRD:  No dialysis for 2 weeks. Will plan for a short HD today then another dialysis session tomorrow to prevent disequilibrium. Will need an outpatient HD unit. Renal navigators are off today, will initiate CLIP process on Monday.   Hypertension/volume: BP elevated, home meds resumed  Anemia: Hgb 9.1. Will start aranesp .   Metabolic bone disease: Calcium  low, check albumin  and PTH. Phos elevated, noncompliant with binders. Restart here.   Nutrition:  Will need renal diet and fluid restrictions.   Lucie Collet, PA-C 08/06/2024, 8:11 AM   [facility] Pager: [phone]

## 2024-08-06 NOTE — H&P (Signed)
 History and Physical    Stacey Lucas FMW:[id number] DOB: 01-Feb-1976 DOA: 08/06/2024  PCP: Delbert Clam, MD   Patient coming from: Home   Chief Complaint: need dialysis   HPI: Stacey Lucas is a 48 y.o. female with medical history significant for hypertension, type 2 diabetes mellitus, ESRD on hemodialysis, PAF on Eliquis , and history of CVA, now presenting to the ED for dialysis.  Patient reports that she had been living with her brother in [place] city where she was receiving dialysis, but is now homeless and residing in [place].  She reports that that her last dialysis was 2 weeks ago.  She has not had any of her medications in the past 2 weeks or so as well.  She reports exertional dyspnea but denies any leg swelling, chest pain, fevers, or chills.  She denies melena or hematochezia.  ED Course: Upon arrival to the ED, patient is found to be afebrile and saturating well on room air with normal RR, normal HR, and elevated BP.  Labs are most notable for potassium 5.9, BUN 126, and hemoglobin 9.6.  Chest x-ray is negative for acute findings.  EKG demonstrates sinus rhythm with LAD.  Nephrology (Dr. Marlee) was consulted by the ED PA and the patient was treated with Lokelma  and insulin  with dextrose .  Review of Systems:  All other systems reviewed and apart from HPI, are negative.  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  12/29/2021   Anxiety and depression    Atrial fibrillation (HCC)    Cerebral thrombosis with cerebral infarction 05/24/2021   Cocaine abuse (HCC)    Depression with suicidal ideation    [facility] admission 04/2018   Diabetes mellitus without complication (HCC)    Type II   End stage kidney disease (HCC)    GERD (gastroesophageal reflux disease)    Hyperlipidemia    Hypertension    Impingement syndrome of right shoulder    Migraine headache     Osteoarthritis of right knee 05/24/2021   Pilonidal abscess 05/2013   Pyelonephritis    Right thyroid nodule    Sciatica     Past Surgical History:  Procedure Laterality Date   AV FISTULA PLACEMENT Left 03/17/2023   Procedure: LEFT ARM BRACHIOCEPHALIC ARTERIOVENOUS (AV) FISTULA CREATION;  Surgeon: Lanis Fonda BRAVO, MD;  Location: [facility] OR;  Service: Vascular;  Laterality: Left;   BUBBLE STUDY  01/01/2022   Procedure: BUBBLE STUDY;  Surgeon: Alvan Ronal BRAVO, MD;  Location: [facility] ENDOSCOPY;  Service: Cardiovascular;;   IR FLUORO GUIDE CV LINE RIGHT  03/14/2023   IR US  GUIDE VASC ACCESS RIGHT  03/14/2023   TEE WITHOUT CARDIOVERSION N/A 01/01/2022   Procedure: TRANSESOPHAGEAL ECHOCARDIOGRAM (TEE);  Surgeon: Alvan Ronal BRAVO, MD;  Location: [facility] ENDOSCOPY;  Service: Cardiovascular;  Laterality: N/A;   tubal     TUBAL LIGATION      Social History:   reports that she has been smoking cigarettes. She has been exposed to tobacco smoke. She has never used smokeless tobacco. She reports current alcohol use. She reports that she does not use drugs.  Allergies  Allergen Reactions   Neurontin  [Gabapentin ] Other (See Comments)    Weakness  Balance impairment     Family History  Problem Relation Age of Onset   Diabetes Mother    Hypertension Mother    Arthritis Mother    Diabetes Father    Hypertension Father  Prior to Admission medications   Medication Sig Start Date End Date Taking? Authorizing Provider  amLODipine  (NORVASC ) 10 MG tablet Take 1 tablet (10 mg total) by mouth daily. 11/12/23   Newlin, Enobong, MD  apixaban  (ELIQUIS ) 2.5 MG TABS tablet Take 1 tablet (2.5 mg total) by mouth 2 (two) times daily. 11/12/23   Newlin, Enobong, MD  atorvastatin  (LIPITOR ) 40 MG tablet Take 1 tablet (40 mg total) by mouth daily. 07/08/23   Krishnan, Gokul, MD  buPROPion  (WELLBUTRIN  XL) 150 MG 24 hr tablet Take 1 tablet (150 mg total) by mouth daily. 11/12/23   Newlin, Enobong, MD  carvedilol  (COREG ) 6.25 MG  tablet Take 1 tablet (6.25 mg total) by mouth 2 (two) times daily with a meal. 11/12/23   Delbert Clam, MD  DULoxetine  (CYMBALTA ) 30 MG capsule Take 1 capsule (30 mg total) by mouth daily for chronic pain 11/12/23   Newlin, Enobong, MD  hydrALAZINE  (APRESOLINE ) 25 MG tablet Take 1 tablet (25 mg total) by mouth 2 (two) times daily. 02/17/24   Newlin, Enobong, MD  hydrOXYzine  (ATARAX ) 25 MG tablet Take 1 tablet (25 mg total) by mouth at bedtime as needed. 11/12/23   Newlin, Enobong, MD  nicotine  polacrilex (NICORETTE) 4 MG gum Take by mouth. 10/31/23   [provider]  ondansetron  (ZOFRAN -ODT) 4 MG disintegrating tablet Take 1 tablet (4 mg total) by mouth every 8 (eight) hours as needed for nausea or vomiting. 02/01/24   Raford Lenis, MD    Physical Exam: Vitals:   08/06/24 0043 08/06/24 0049 08/06/24 0315 08/06/24 0330  BP: (!) 177/72  (!) 150/104   Pulse: 93  100   Resp: 14  18   Temp: 98.5 F (36.9 C)   97.8 F (36.6 C)  TempSrc: Oral     SpO2: 100%  100%   Weight:  68 kg    Height:  5' 5 (1.651 m)      Constitutional: NAD, no pallor or diaphoresis  Eyes: PERTLA, lids and conjunctivae normal ENMT: Mucous membranes are moist. Posterior pharynx clear of any exudate or lesions.   Neck: supple, no masses  Respiratory: no wheezing, no crackles. No accessory muscle use.  Cardiovascular: S1 & S2 heard, regular rate and rhythm. No extremity edema.  Abdomen: No tenderness, soft. Bowel sounds active.  Musculoskeletal: no clubbing / cyanosis. No joint deformity upper and lower extremities.   Skin: Xerosis. Warm, dry, well-perfused. Neurologic: CN 2-12 grossly intact. Moving all extremities. Alert and oriented.  Psychiatric: Calm. Cooperative.    Labs and Imaging on Admission: I have personally reviewed following labs and imaging studies  CBC: Recent Labs  Lab 08/06/24 0106  WBC 10.0  HGB 9.6*  HCT 29.2*  MCV 80.7  PLT 271   Basic Metabolic Panel: Recent Labs  Lab  08/06/24 0106  NA 140  K 5.9*  CL 99  CO2 19*  GLUCOSE 180*  BUN 126*  CREATININE 16.38*  CALCIUM  6.8*   GFR: Estimated Creatinine Clearance: 3.7 mL/min (A) (by C-G formula based on SCr of 16.38 mg/dL (H)). Liver Function Tests: No results for input(s): AST, ALT, ALKPHOS, BILITOT, PROT, ALBUMIN  in the last 168 hours. No results for input(s): LIPASE, AMYLASE in the last 168 hours. No results for input(s): AMMONIA in the last 168 hours. Coagulation Profile: No results for input(s): INR, PROTIME in the last 168 hours. Cardiac Enzymes: No results for input(s): CKTOTAL, CKMB, CKMBINDEX, TROPONINI in the last 168 hours. BNP (last 3 results) No results for input(s):  PROBNP in the last 8760 hours. HbA1C: No results for input(s): HGBA1C in the last 72 hours. CBG: Recent Labs  Lab 08/06/24 0340  GLUCAP 237*   Lipid Profile: No results for input(s): CHOL, HDL, LDLCALC, TRIG, CHOLHDL, LDLDIRECT in the last 72 hours. Thyroid Function Tests: No results for input(s): TSH, T4TOTAL, FREET4, T3FREE, THYROIDAB in the last 72 hours. Anemia Panel: No results for input(s): VITAMINB12, FOLATE, FERRITIN, TIBC, IRON , RETICCTPCT in the last 72 hours. Urine analysis:    Component Value Date/Time   COLORURINE AMBER (A) 11/14/2022 1902   APPEARANCEUR CLOUDY (A) 11/14/2022 1902   LABSPEC 1.018 11/14/2022 1902   PHURINE 7.0 11/14/2022 1902   GLUCOSEU NEGATIVE 11/14/2022 1902   HGBUR NEGATIVE 11/14/2022 1902   BILIRUBINUR NEGATIVE 11/14/2022 1902   KETONESUR 5 (A) 11/14/2022 1902   PROTEINUR >=300 (A) 11/14/2022 1902   NITRITE NEGATIVE 11/14/2022 1902   LEUKOCYTESUR MODERATE (A) 11/14/2022 1902   Sepsis Labs: @LABRCNTIP (procalcitonin:4,lacticidven:4) )No results found for this or any previous visit (from the past 240 hours).   Radiological Exams on Admission: DG Chest 2 View Result Date: 08/06/2024 EXAM: 2 VIEW(S) XRAY OF  THE CHEST 08/06/2024 01:22:00 AM COMPARISON: Comparison with 05/26/2024. CLINICAL HISTORY: chest pain FINDINGS: LUNGS AND PLEURA: Lungs are clear. No pleural effusion. No pneumothorax. HEART AND MEDIASTINUM: Heart size and pulmonary vascularity are normal. Mediastinal contours appear intact. BONES AND SOFT TISSUES: No acute osseous abnormality. IMPRESSION: 1. No acute cardiopulmonary pathology. Electronically signed by: Elsie Gravely MD 08/06/2024 01:32 AM EST RP Workstation: [id number]    EKG: Independently reviewed. Sinus rhythm, LAD.   Assessment/Plan   1. ESRD; hyperkalemia  - Treated with Lokelma  and insulin /dextrose  in ED  - Nephrology consulted by ED PA  - Restrict fluids, renally-dose medications, continue cardiac monitoring  2. PAF  - Eliquis , Coreg     3. Hypertension  - Norvasc , hydralazine , Coreg     4. Hx of CVA  - Eliquis , Lipitor     5. Anemia  - Hgb is 9.6, down from 12.5 two weeks ago  - No overt bleeding, possibly dilutional  - Check anemia panel, trend CBCs   6. Type II DM  - Check CBGs and use low-intensity SSI    DVT prophylaxis: Eliquis   Code Status: DNR  Level of Care: Level of care: Progressive Family Communication: None present  Disposition Plan:  Patient is from: Home  Anticipated d/c is to: TBD Anticipated d/c date is: 08/09/24  Patient currently: Pending nephrology consultation, dialysis, disposition planning  Consults called: Nephrology  Admission status: Inpatient     Evalene GORMAN Sprinkles, MD [facility]  08/06/2024, 3:57 AM

## 2024-08-06 NOTE — ED Triage Notes (Signed)
 Pt arrives POV with complaints of needing dialysis due to not having a treatment in two weeks. Pt states that she is newly homeless and does not have a treatment center to go to. Has had an ongoing headache and some chest tightness for a couple of days.

## 2024-08-06 NOTE — ED Provider Notes (Signed)
 [place] EMERGENCY DEPARTMENT AT [facility] Provider Note   CSN: [id number] Arrival date & time: 08/06/24  9964     Patient presents with: No chief complaint on file.   Stacey Lucas is a 48 y.o. female.   48 year old female with a history of diabetes, hyperlipidemia, hypertension, ESRD (on dialysis), atrial fibrillation (prescribed Eliquis ), and ESRD presents to the emergency department today for chest pain.  She states that she has been experiencing some chest tightness over the past few days.  Her symptoms are minimal/mild at this time.  Also reports noncompliance with her daily medications as well as her dialysis sessions.  She last took any of her medications about 2 weeks ago.  This was also around the last time she was dialyzed.  States that she was living in [place] with her brother and was attending a dialysis center there, but had a falling out with her brother and decided to moved to [place].  She is currently homeless.  The history is provided by the patient. No language interpreter was used.       Prior to Admission medications   Medication Sig Start Date End Date Taking? Authorizing Provider  amLODipine  (NORVASC ) 10 MG tablet Take 1 tablet (10 mg total) by mouth daily. 11/12/23   Newlin, Enobong, MD  apixaban  (ELIQUIS ) 2.5 MG TABS tablet Take 1 tablet (2.5 mg total) by mouth 2 (two) times daily. 11/12/23   Newlin, Enobong, MD  atorvastatin  (LIPITOR ) 40 MG tablet Take 1 tablet (40 mg total) by mouth daily. 07/08/23   Krishnan, Gokul, MD  buPROPion  (WELLBUTRIN  XL) 150 MG 24 hr tablet Take 1 tablet (150 mg total) by mouth daily. 11/12/23   Newlin, Enobong, MD  carvedilol  (COREG ) 6.25 MG tablet Take 1 tablet (6.25 mg total) by mouth 2 (two) times daily with a meal. 11/12/23   Delbert Clam, MD  DULoxetine  (CYMBALTA ) 30 MG capsule Take 1 capsule (30 mg total) by mouth daily for chronic pain 11/12/23   Newlin, Enobong, MD  hydrALAZINE  (APRESOLINE ) 25 MG tablet Take 1  tablet (25 mg total) by mouth 2 (two) times daily. 02/17/24   Newlin, Enobong, MD  hydrOXYzine  (ATARAX ) 25 MG tablet Take 1 tablet (25 mg total) by mouth at bedtime as needed. 11/12/23   Newlin, Enobong, MD  nicotine  polacrilex (NICORETTE) 4 MG gum Take by mouth. 10/31/23   [provider]  ondansetron  (ZOFRAN -ODT) 4 MG disintegrating tablet Take 1 tablet (4 mg total) by mouth every 8 (eight) hours as needed for nausea or vomiting. 02/01/24   Raford Lenis, MD    Allergies: Neurontin  [gabapentin ]    Review of Systems Ten systems reviewed and are negative for acute change, except as noted in the HPI.    Updated Vital Signs BP (!) 150/104   Pulse 100   Temp 98.5 F (36.9 C) (Oral)   Resp 18   Ht 5' 5 (1.651 m)   Wt 68 kg   LMP 10/11/2019   SpO2 100%   BMI 24.96 kg/m   Physical Exam Vitals and nursing note reviewed.  Constitutional:      General: She is not in acute distress.    Appearance: She is well-developed. She is not diaphoretic.     Comments: Nontoxic appearing and in NAD  HENT:     Head: Normocephalic and atraumatic.  Eyes:     General: No scleral icterus.    Conjunctiva/sclera: Conjunctivae normal.  Cardiovascular:     Rate and Rhythm: Normal rate  and regular rhythm.     Pulses: Normal pulses.     Heart sounds: Murmur heard.     Comments: Palpable thrill LUE fistula Pulmonary:     Effort: Pulmonary effort is normal. No respiratory distress.  Musculoskeletal:        General: Normal range of motion.     Cervical back: Normal range of motion.  Skin:    General: Skin is warm and dry.     Coloration: Skin is not pale.     Findings: No erythema or rash.  Neurological:     Mental Status: She is alert and oriented to person, place, and time.     Coordination: Coordination normal.  Psychiatric:        Behavior: Behavior normal.     (all labs ordered are listed, but only abnormal results are displayed) Labs Reviewed  BASIC METABOLIC PANEL WITH GFR -  Abnormal; Notable for the following components:      Result Value   Potassium 5.9 (*)    CO2 19 (*)    Glucose, Bld 180 (*)    BUN 126 (*)    Creatinine, Ser 16.38 (*)    Calcium  6.8 (*)    GFR, Estimated 2 (*)    Anion gap 22 (*)    All other components within normal limits  CBC - Abnormal; Notable for the following components:   RBC 3.62 (*)    Hemoglobin 9.6 (*)    HCT 29.2 (*)    RDW 16.7 (*)    All other components within normal limits  TROPONIN I (HIGH SENSITIVITY) - Abnormal; Notable for the following components:   Troponin I (High Sensitivity) 26 (*)    All other components within normal limits  TROPONIN I (HIGH SENSITIVITY) - Abnormal; Notable for the following components:   Troponin I (High Sensitivity) 27 (*)    All other components within normal limits  POC OCCULT BLOOD, ED    EKG: EKG Interpretation Date/Time:  Friday August 06 2024 01:07:51 EST Ventricular Rate:  92 PR Interval:  186 QRS Duration:  82 QT Interval:  446 QTC Calculation: 551 R Axis:   -42  Text Interpretation: ** Critical Test Result: Long QTc Normal sinus rhythm Left axis deviation Minimal voltage criteria for LVH, may be normal variant ( Cornell product ) Nonspecific ST and T wave abnormality Prolonged QT Abnormal ECG When compared with ECG of 26-May-2024 09:52, PREVIOUS ECG IS PRESENT Confirmed by Trine Likes [phone]) on 08/06/2024 2:53:20 AM  Radiology: DG Chest 2 View Result Date: 08/06/2024 EXAM: 2 VIEW(S) XRAY OF THE CHEST 08/06/2024 01:22:00 AM COMPARISON: Comparison with 05/26/2024. CLINICAL HISTORY: chest pain FINDINGS: LUNGS AND PLEURA: Lungs are clear. No pleural effusion. No pneumothorax. HEART AND MEDIASTINUM: Heart size and pulmonary vascularity are normal. Mediastinal contours appear intact. BONES AND SOFT TISSUES: No acute osseous abnormality. IMPRESSION: 1. No acute cardiopulmonary pathology. Electronically signed by: Elsie Gravely MD 08/06/2024 01:32 AM EST RP Workstation:  [id number]     Procedures   Medications Ordered in the ED  sodium zirconium cyclosilicate  (LOKELMA ) packet 10 g (10 g Oral Given 08/06/24 0259)  insulin  aspart (novoLOG ) injection 5 Units (5 Units Intravenous Given 08/06/24 0259)    And  dextrose  50 % solution 50 mL (50 mLs Intravenous Given 08/06/24 0300)    Clinical Course as of 08/06/24 0333  Fri Aug 06, 2024  0245 Negative CTA chest on 06/22/24. [KH]  0246 LVEF 55-60% on echocardiogram from October 2024. [KH]  0247 Hemoglobin  is 9.6.  There is a 2 g drop compared to 2 months ago.  Does have a history of anemia, likely secondary to chronic disease/ESRD. [KH]  [phone] Patient with hyperkalemia.  Potassium 5.9.  There is associated QT prolongation on EKG.  Will need dialysis for correction of electrolytes.  Will temper with Lokelma  as well as insulin /glucose.  Consult placed to nephrology to discuss inpatient dialysis. [KH]  9742 Spoke with Dr. Marlee of nephrology.  He requests admission as patient will necessitate placement in a dialysis clinic in the area given that she is now residing in [place]. [KH]  [phone] Case discussed with Dr. Charlton who will admit. [KH]    Clinical Course User Index [KH] Keith Sor, PA-C                                 Medical Decision Making Amount and/or Complexity of Data Reviewed Labs: ordered. Radiology: ordered.  Risk OTC drugs. Prescription drug management. Decision regarding hospitalization.   This patient presents to the ED for concern of chest pain, this involves an extensive number of treatment options, and is a complaint that carries with it a high risk of complications and morbidity.  The differential diagnosis includes ACS vs PNA vs pleural effusion vs pulmonary edema vs contusion   Co morbidities that complicate the patient evaluation  ESRD HTN AF DM   Additional history obtained:  External records from outside source obtained and reviewed including prior discharge  summaries   Lab Tests:  I Ordered, and personally interpreted labs.  The pertinent results include:  Hgb 9.9, K 5.9, Trop 26>27. Creatinine 16.38 c/w need for dialysis   Imaging Studies ordered:  I ordered imaging studies including CXR  I independently visualized and interpreted imaging which showed no acute cardiopulmonary abnormality.  No pulmonary edema I agree with the radiologist interpretation   Cardiac Monitoring:  The patient was maintained on a cardiac monitor.  I personally viewed and interpreted the cardiac monitored which showed an underlying rhythm of: NSR   Medicines ordered and prescription drug management:  I ordered medication including Lokelma  as well as insulin /dextrose  for hyperkalemia  Reevaluation of the patient after these medicines showed that the patient remained stable. I have reviewed the patients home medicines and have made adjustments as needed   Test Considered:  UDS   Critical Interventions:  Lokelma  and insulin /dextrose  for management of hyperkalemia in the setting of EKG changes, QTc prolongation   Consultations Obtained:  I requested consultation with Dr. Marlee of Nephrology and discussed lab and imaging findings as well as pertinent plan - they recommend admission for dialysis and placement in outpatient dialysis center   Problem List / ED Course:  As above   Reevaluation:  After the interventions noted above, I reevaluated the patient and found that they have :stayed the same   Social Determinants of Health:  Housing instability   Dispostion:  After consideration of the diagnostic results and the patients response to treatment, I feel that the patent would benefit from admission for management of hyperkalemia w/QT prolongation as well as need for dialysis. Nephrology to follow while inpatient. Hospitalist team to admit.       Final diagnoses:  Noncompliance with renal dialysis  Hyperkalemia    ED Discharge  Orders     None          Keith Sor, PA-C 08/06/24 9662    Cardama, Raynell Moder,  MD 08/06/24 0710

## 2024-08-06 NOTE — ED Notes (Addendum)
Placed pt on bedpan, pt unable to urinate.

## 2024-08-06 NOTE — Discharge Instructions (Addendum)
 Follow with Primary MD Delbert Clam, MD in 7 days   Get CBC, CMP, Magnesium , 2 view Chest X ray -  checked next visit with your primary MD   Activity: As tolerated with Full fall precautions use walker/cane & assistance as needed  Disposition Home    Diet: Renal-low carbohydrate diet with 1.2 L fluid restriction per day.  Check CBGs q. ACHS.  Special Instructions: If you have smoked or chewed Tobacco  in the last 2 yrs please stop smoking, stop any regular Alcohol  and or any Recreational drug use.  On your next visit with your primary care physician please Get Medicines reviewed and adjusted.  Please request your Prim.MD to go over all Hospital Tests and Procedure/Radiological results at the follow up, please get all Hospital records sent to your Prim MD by signing hospital release before you go home.  If you experience worsening of your admission symptoms, develop shortness of breath, life threatening emergency, suicidal or homicidal thoughts you must seek medical attention immediately by calling 911 or calling your MD immediately  if symptoms less severe.  You Must read complete instructions/literature along with all the possible adverse reactions/side effects for all the Medicines you take and that have been prescribed to you. Take any new Medicines after you have completely understood and accpet all the possible adverse reactions/side effects.   Do not drive when taking Pain medications.  Do not take more than prescribed Pain, Sleep and Anxiety Medications  Wear Seat belts while driving.

## 2024-08-06 NOTE — TOC Initial Note (Signed)
 Transition of Care [facility]) - Initial/Assessment Note    Patient Details  Name: Stacey Lucas MRN: [id number] Date of Birth: 10-15-1975  Transition of Care [facility] CM/SW Contact:    Bernardino Dean, LCSWA Phone Number: 08/06/2024, 3:18 PM  Clinical Narrative:                 Stacey Lucas is a 48 yo unhoused f w/ ESRD on hemodialysis who presents through the ED after missing dialysis for several weeks. She does not have an OP HD provider, nor is she connected with any services in the area. She had been living in [place] with family, but moved here with a partner who has since abandoned her and left her without a place to stay. She reports being unsheltered for the last several weeks. Her belongings are here in the hospital with her. She does have a phone.   We reviewed her plans for discharging from the hospital. She states her family in [place] will not take her back, even if she had a ride there. She requested housing resources stating she did not know where to begin. We reviewed the resources together, including [place] coordinated entry and various housing programs. Patient was encouraged to call these programs while hospitalized in hopes arrangements could be made prior to discharge. She stated she would. Supportive listening and MI was utilized.   Expected Discharge Plan: Homeless Shelter Barriers to Discharge: Homeless with medical needs   Patient Goals and CMS Choice Patient states their goals for this hospitalization and ongoing recovery are:: Undetermined   Choice offered to / list presented to : NA      Expected Discharge Plan and Services In-house Referral: Clinical Social Work   Post Acute Care Choice: NA Living arrangements for the past 2 months: Homeless                                      Prior Living Arrangements/Services Living arrangements for the past 2 months: Homeless Lives with:: Self Patient language and need for interpreter reviewed:: Yes Do you feel  safe going back to the place where you live?: Yes      Need for Family Participation in Patient Care: No (Comment) Care giver support system in place?: No (comment)   Criminal Activity/Legal Involvement Pertinent to Current Situation/Hospitalization: No - Comment as needed  Activities of Daily Living   ADL Screening (condition at time of admission) Independently performs ADLs?: Yes (appropriate for developmental age) Is the patient deaf or have difficulty hearing?: No Does the patient have difficulty seeing, even when wearing glasses/contacts?: No Does the patient have difficulty concentrating, remembering, or making decisions?: No  Permission Sought/Granted                  Emotional Assessment Appearance:: Appears stated age Attitude/Demeanor/Rapport: Engaged Affect (typically observed): Guarded, Tearful/Crying Orientation: : Oriented to Self, Oriented to Place, Oriented to  Time, Oriented to Situation Alcohol / Substance Use: Not Applicable Psych Involvement: No (comment)  Admission diagnosis:  Hyperkalemia [E87.5] Noncompliance with renal dialysis [Z91.158] ESRD needing dialysis (HCC) [N18.6, Z99.2] Patient Active Problem List   Diagnosis Date Noted   ESRD needing dialysis (HCC) 08/06/2024   Prolonged QT interval 08/06/2024   Volume overload 11/02/2023   Acute pulmonary edema (HCC) 11/02/2023   Elevated troponin 11/02/2023   Leukocytosis 11/02/2023   SOB (shortness of breath) 11/02/2023   History of anemia  due to chronic kidney disease 11/02/2023   Hyperkalemia 11/01/2023   Hypertensive emergency 07/07/2023   Gait disturbance, post-stroke 03/06/2023   ESRD (end stage renal disease) (HCC) 02/04/2023   Polysubstance abuse (HCC) 12/03/2022   Nephrotic syndrome 11/15/2022   Cerebrovascular accident (CVA) (HCC) 11/15/2022   Anemia 01/16/2022   Paroxysmal atrial fibrillation (HCC) 07/04/2021   H/O: stroke 07/04/2021   Osteoarthritis of right knee 05/24/2021   DM2  (diabetes mellitus, type 2) (HCC) 05/23/2021   Essential hypertension 05/23/2021   Hyperlipidemia 05/23/2021   Thyroid nodule 05/23/2021   MDD (major depressive disorder), recurrent severe, without psychosis (HCC) 04/27/2018   PCP:  Delbert Clam, MD Pharmacy:   [facility] [phone] - [place], [place] - [address] AT [facility] OF [address] [place] [address] Phone: [phone] Fax: [phone]  [facility] - [facility] [address], [address] [place] [address] Phone: [phone] Fax: [phone]     Social Drivers of Health (SDOH) Social History: SDOH Screenings   Food Insecurity: Food Insecurity Present (08/06/2024)  Housing: High Risk (08/06/2024)  Transportation Needs: Unmet Transportation Needs (08/06/2024)  Utilities: At Risk (08/06/2024)  Alcohol Screen: Low Risk  (04/25/2018)  Depression (PHQ2-9): Low Risk  (02/17/2024)  Financial Resource Strain: Low Risk (06/23/2024)   Received from [facility]  Social Connections: Moderately Integrated (02/17/2024)  Stress: Stress Concern Present (03/20/2023)  Tobacco Use: High Risk (08/06/2024)   SDOH Interventions: Food Insecurity Interventions: [employer] Provided, Inpatient TOC Housing Interventions: [employer] Provided, Inpatient [employer] Transportation Interventions: [employer] Provided, Inpatient TOC Utilities Interventions: [employer] Provided, Inpatient TOC   Readmission Risk Interventions     No data to display

## 2024-08-06 NOTE — Plan of Care (Signed)

## 2024-08-06 NOTE — TOC Initial Note (Addendum)
 Transition of Care [facility]) - Initial/Assessment Note    Patient Details  Name: Stacey Lucas MRN: [id number] Date of Birth: [birth year]  Transition of Care [facility]) CM/SW Contact:    Landry DELENA Senters, RN Phone Number: 08/06/2024, 3:24 PM  Clinical Narrative:                 RR:[id number] history significant for hypertension, type 2 diabetes mellitus, ESRD on hemodialysis, PAF on Eliquis , and history of CVA, now presenting to the ED for dialysis.   Patient reports she is homeless, was living with family in [place], but left due to family conflict and cannot go back. Patient plans to stay in [place], she reports she was living with a friend, but is not allowed to go back there, and now has no support and nowhere to go.   Patient does not drive, has not had dialysis for at least a couple of weeks, has not taken any routine medications in the last couple of weeks. Patient does not have a PCP, requests help with setting up PCP in [place]. Patient has manual wheelchair.   PCP offices are closed today for holiday, number to call listed on AVS.  Transportation information for [employer] through [facility] and DSS transportation numbers added to AVS for patient to set up transportation needs.   Patient does report alcohol use, SDOH screening complete.   CM will continue to follow.   Expected Discharge Plan:  (TBD) Barriers to Discharge: Continued Medical Work up   Patient Goals and CMS Choice Patient states their goals for this hospitalization and ongoing recovery are:: Undetermined   Choice offered to / list presented to : NA      Expected Discharge Plan and Services In-house Referral: Clinical Social Work   Post Acute Care Choice: NA Living arrangements for the past 2 months: Homeless                                      Prior Living Arrangements/Services Living arrangements for the past 2 months: Homeless Lives with:: Self Patient language and need for interpreter reviewed::  Yes Do you feel safe going back to the place where you live?: No   patient reports she is homeless and needs help finding a place to live  Need for Family Participation in Patient Care: Yes (Comment) Care giver support system in place?: No (comment) Current home services: DME (manual W/C) Criminal Activity/Legal Involvement Pertinent to Current Situation/Hospitalization: No - Comment as needed  Activities of Daily Living   ADL Screening (condition at time of admission) Independently performs ADLs?: Yes (appropriate for developmental age) Is the patient deaf or have difficulty hearing?: No Does the patient have difficulty seeing, even when wearing glasses/contacts?: No Does the patient have difficulty concentrating, remembering, or making decisions?: No  Permission Sought/Granted                  Emotional Assessment Appearance:: Developmentally appropriate Attitude/Demeanor/Rapport: Engaged Affect (typically observed): Calm Orientation: : Oriented to Self, Oriented to Place, Oriented to  Time, Oriented to Situation Alcohol / Substance Use: Alcohol Use Psych Involvement: No (comment)  Admission diagnosis:  Hyperkalemia [E87.5] Noncompliance with renal dialysis [Z91.158] ESRD needing dialysis (HCC) [N18.6, Z99.2] Patient Active Problem List   Diagnosis Date Noted   ESRD needing dialysis (HCC) 08/06/2024   Prolonged QT interval 08/06/2024   Volume overload 11/02/2023   Acute pulmonary edema (HCC) 11/02/2023  Elevated troponin 11/02/2023   Leukocytosis 11/02/2023   SOB (shortness of breath) 11/02/2023   History of anemia due to chronic kidney disease 11/02/2023   Hyperkalemia 11/01/2023   Hypertensive emergency 07/07/2023   Gait disturbance, post-stroke 03/06/2023   ESRD (end stage renal disease) (HCC) 02/04/2023   Polysubstance abuse (HCC) 12/03/2022   Nephrotic syndrome 11/15/2022   Cerebrovascular accident (CVA) (HCC) 11/15/2022   Anemia 01/16/2022   Paroxysmal  atrial fibrillation (HCC) 07/04/2021   H/O: stroke 07/04/2021   Osteoarthritis of right knee 05/24/2021   DM2 (diabetes mellitus, type 2) (HCC) 05/23/2021   Essential hypertension 05/23/2021   Hyperlipidemia 05/23/2021   Thyroid nodule 05/23/2021   MDD (major depressive disorder), recurrent severe, without psychosis (HCC) 04/27/2018   PCP:  Delbert Clam, MD Pharmacy:   [facility] [phone] - [place], [place] - [address] AT [facility] OF [address] [place] [address] Phone: [phone] Fax: [phone]  [facility] - [facility] [address] [place] [address] Phone: [phone] Fax: [phone]     Social Drivers of Health (SDOH) Social History: SDOH Screenings   Food Insecurity: Food Insecurity Present (08/06/2024)  Housing: High Risk (08/06/2024)  Transportation Needs: Unmet Transportation Needs (08/06/2024)  Utilities: At Risk (08/06/2024)  Alcohol Screen: Low Risk  (04/25/2018)  Depression (PHQ2-9): Low Risk  (02/17/2024)  Financial Resource Strain: Low Risk (06/23/2024)   Received from [facility]  Social Connections: Moderately Integrated (02/17/2024)  Stress: Stress Concern Present (03/20/2023)  Tobacco Use: High Risk (08/06/2024)   SDOH Interventions: Food Insecurity Interventions: [employer] Provided, Inpatient TOC Housing Interventions: [employer] Provided, Inpatient [employer] Transportation Interventions: [employer] Provided, Inpatient TOC Utilities Interventions: [employer] Provided, Inpatient TOC   Readmission Risk Interventions     No data to display

## 2024-08-06 NOTE — Procedures (Signed)
 I was present at this dialysis session. I have reviewed the session itself and made appropriate changes.   Vital signs in last 24 hours:  Temp:  [97.8 F (36.6 C)-98.7 F (37.1 C)] 98.1 F (36.7 C) (11/28 0920) Pulse Rate:  [93-107] 107 (11/28 0926) Resp:  [14-18] 18 (11/28 0926) BP: (149-177)/(71-104) 158/74 (11/28 0926) SpO2:  [96 %-100 %] 100 % (11/28 0926) Weight:  [64.5 kg-68 kg] 64.5 kg (11/28 0920) Weight change:  Filed Weights   08/06/24 0049 08/06/24 0420 08/06/24 0920  Weight: 68 kg 64.5 kg 64.5 kg    Recent Labs  Lab 08/06/24 0434  NA 142  K 5.4*  CL 101  CO2 19*  GLUCOSE 209*  BUN 127*  CREATININE 16.26*  CALCIUM  6.6*  PHOS 8.4*    Recent Labs  Lab 08/06/24 0106 08/06/24 0434  WBC 10.0 9.6  HGB 9.6* 9.1*  HCT 29.2* 27.1*  MCV 80.7 78.8*  PLT 271 262    Scheduled Meds:  amLODipine   10 mg Oral Daily   apixaban   2.5 mg Oral BID   atorvastatin   40 mg Oral Daily   carvedilol   6.25 mg Oral BID WC   Chlorhexidine  Gluconate Cloth  6 each Topical Q0600   hydrALAZINE   25 mg Oral BID   insulin  aspart  0-5 Units Subcutaneous QHS   insulin  aspart  0-6 Units Subcutaneous TID WC   sodium chloride  flush  3 mL Intravenous Q12H   Continuous Infusions: PRN Meds:.acetaminophen  **OR** acetaminophen , pentafluoroprop-tetrafluoroeth, senna   Stacey DELENA Sellar,  MD 08/06/2024, 9:54 AM

## 2024-08-06 NOTE — Progress Notes (Signed)
 PROGRESS NOTE        PATIENT DETAILS Name: Stacey Lucas Age: 48 y.o. Sex: female Date of Birth: Aug 15, 1976 Admit Date: 08/06/2024 Admitting Physician Evalene GORMAN Sprinkles, MD ERE:Wztopw, [name], MD  Brief Summary: Patient is a 48 y.o.  female with history of prior CVA with mild right-sided hemiparesis, ESRD on HD, HTN, DM-2-homelessness-who apparently moving to [facility] recently-has not had dialysis in several weeks-presented to the hospital with fatigue/malaise-found to have hyperkalemia.  Significant events: 11/28>> admit to [facility]  Significant studies: 11/28>> CXR: No PNA  Significant microbiology data: None  Procedures: None  Consults: Nephrology  Subjective: Lying comfortably in bed-denies any chest pain or shortness of breath.  Some dysarthria which is chronic for patient.  Objective: Vitals: Blood pressure (!) 154/71, pulse (!) 106, temperature 98.7 F (37.1 C), resp. rate 18, height 5' 5 (1.651 m), weight 64.5 kg, last menstrual period 10/11/2019, SpO2 97%.   Exam: Gen Exam:Alert awake-not in any distress HEENT:atraumatic, normocephalic Chest: B/L clear to auscultation anteriorly CVS:S1S2 regular Abdomen:soft non tender, non distended Extremities:no edema Neurology: Non focal Skin: no rash  Pertinent Labs/Radiology:    Latest Ref Rng & Units 08/06/2024    4:34 AM 08/06/2024    1:06 AM 05/26/2024    2:07 PM  CBC  WBC 4.0 - 10.5 K/uL 9.6  10.0  10.7   Hemoglobin 12.0 - 15.0 g/dL 9.1  9.6  88.3   Hematocrit 36.0 - 46.0 % 27.1  29.2  36.1   Platelets 150 - 400 K/uL 262  271  281     Lab Results  Component Value Date   NA 142 08/06/2024   K 5.4 (H) 08/06/2024   CL 101 08/06/2024   CO2 19 (L) 08/06/2024    Assessment/Plan: Hyperkalemia Secondary to missed HD On [place]  Await nephrology evaluation for initiation of HD.  ESRD on HD Unfortunately-patient does not have a dedicated outpatient HD center-she is homeless-and  apparently comes to the ED quite a bit for dialysis. Nephrology following-for HD Will need TOC/diabetic coordinator evaluation prior to discharge.  Normocytic anemia Likely related to ESRD Aranesp /Iron  defer to nephrology service.  HTN BP relatively stable Continue amlodipine /Coreg   HLD Statin  DM-2 (A1c 5.6 on 10/28) CBG stable on SSI  Recent Labs    08/06/24 0340 08/06/24 0739  GLUCAP 237* 283*     PAF Telemetry monitoring Eliquis   History of CVA with mild right-sided hemiparesis/dysarthria Stable/at baseline Eliquis /statin.  Code status:   Code Status: Limited: Do not attempt resuscitation (DNR) -DNR-LIMITED -Do Not Intubate/DNI    DVT Prophylaxis: apixaban  (ELIQUIS ) tablet 2.5 mg Start: 08/06/24 1000 apixaban  (ELIQUIS ) tablet 2.5 mg    Family Communication: None at bedside   Disposition Plan: Status is: Inpatient Remains inpatient appropriate because: Severity of illness   Planned Discharge Destination:Home   Diet: Diet Order             Diet renal with fluid restriction Fluid restriction: 1200 mL Fluid; Room service appropriate? Yes; Fluid consistency: Thin  Diet effective now                     Antimicrobial agents: Anti-infectives (From admission, onward)    None        MEDICATIONS: Scheduled Meds:  amLODipine   10 mg Oral Daily   apixaban   2.5 mg Oral BID   atorvastatin   40 mg Oral Daily   carvedilol   6.25 mg Oral BID WC   hydrALAZINE   25 mg Oral BID   insulin  aspart  0-5 Units Subcutaneous QHS   insulin  aspart  0-6 Units Subcutaneous TID WC   sodium chloride  flush  3 mL Intravenous Q12H   sodium zirconium cyclosilicate   10 g Oral Once   Continuous Infusions: PRN Meds:.acetaminophen  **OR** acetaminophen , senna   I have personally reviewed following labs and imaging studies  LABORATORY DATA: CBC: Recent Labs  Lab 08/06/24 0106 08/06/24 0434  WBC 10.0 9.6  HGB 9.6* 9.1*  HCT 29.2* 27.1*  MCV 80.7 78.8*  PLT 271  262    Basic Metabolic Panel: Recent Labs  Lab 08/06/24 0106 08/06/24 0434  NA 140 142  K 5.9* 5.4*  CL 99 101  CO2 19* 19*  GLUCOSE 180* 209*  BUN 126* 127*  CREATININE 16.38* 16.26*  CALCIUM  6.8* 6.6*  PHOS  --  8.4*    GFR: Estimated Creatinine Clearance: 3.8 mL/min (A) (by C-G formula based on SCr of 16.26 mg/dL (H)).  Liver Function Tests: No results for input(s): AST, ALT, ALKPHOS, BILITOT, PROT, ALBUMIN  in the last 168 hours. No results for input(s): LIPASE, AMYLASE in the last 168 hours. No results for input(s): AMMONIA in the last 168 hours.  Coagulation Profile: No results for input(s): INR, PROTIME in the last 168 hours.  Cardiac Enzymes: No results for input(s): CKTOTAL, CKMB, CKMBINDEX, TROPONINI in the last 168 hours.  BNP (last 3 results) No results for input(s): PROBNP in the last 8760 hours.  Lipid Profile: No results for input(s): CHOL, HDL, LDLCALC, TRIG, CHOLHDL, LDLDIRECT in the last 72 hours.  Thyroid Function Tests: No results for input(s): TSH, T4TOTAL, FREET4, T3FREE, THYROIDAB in the last 72 hours.  Anemia Panel: Recent Labs    08/06/24 0434  VITAMINB12 633  FOLATE 13.4  FERRITIN 773*  TIBC 200*  IRON  126  RETICCTPCT 0.6    Urine analysis:    Component Value Date/Time   COLORURINE AMBER (A) 11/14/2022 1902   APPEARANCEUR CLOUDY (A) 11/14/2022 1902   LABSPEC 1.018 11/14/2022 1902   PHURINE 7.0 11/14/2022 1902   GLUCOSEU NEGATIVE 11/14/2022 1902   HGBUR NEGATIVE 11/14/2022 1902   BILIRUBINUR NEGATIVE 11/14/2022 1902   KETONESUR 5 (A) 11/14/2022 1902   PROTEINUR >=300 (A) 11/14/2022 1902   NITRITE NEGATIVE 11/14/2022 1902   LEUKOCYTESUR MODERATE (A) 11/14/2022 1902    Sepsis Labs: Lactic Acid, Venous    Component Value Date/Time   LATICACIDVEN 0.52 12/29/2016 1522    MICROBIOLOGY: Recent Results (from the past 240 hours)  MRSA Next Gen by PCR, Nasal     Status:  None   Collection Time: 08/06/24  4:47 AM   Specimen: Nasal Mucosa; Nasal Swab  Result Value Ref Range Status   MRSA by PCR Next Gen NOT DETECTED NOT DETECTED Final    Comment: (NOTE) The GeneXpert MRSA Assay (FDA approved for NASAL specimens only), is one component of a comprehensive MRSA colonization surveillance program. It is not intended to diagnose MRSA infection nor to guide or monitor treatment for MRSA infections. Test performance is not FDA approved in patients less than 74 years old. Performed at [facility], [address] [place], [place] [address]     RADIOLOGY STUDIES/RESULTS: DG Chest 2 View Result Date: 08/06/2024 EXAM: 2 VIEW(S) XRAY OF THE CHEST 08/06/2024 01:22:00 AM COMPARISON: Comparison with 05/26/2024. CLINICAL HISTORY: chest pain FINDINGS: LUNGS AND PLEURA: Lungs are clear.  No pleural effusion. No pneumothorax. HEART AND MEDIASTINUM: Heart size and pulmonary vascularity are normal. Mediastinal contours appear intact. BONES AND SOFT TISSUES: No acute osseous abnormality. IMPRESSION: 1. No acute cardiopulmonary pathology. Electronically signed by: Elsie Gravely MD 08/06/2024 01:32 AM EST RP Workstation: [id number]     LOS: 0 days   Donalda Applebaum, MD  [facility]    To contact the attending provider between 7A-7P or the covering provider during after hours 7P-7A, please log into the web site [email or web address] and access using universal [place] password for that web site. If you do not have the password, please call the hospital operator.  08/06/2024, 8:19 AM

## 2024-08-07 DIAGNOSIS — D649 Anemia, unspecified: Secondary | ICD-10-CM | POA: Diagnosis not present

## 2024-08-07 DIAGNOSIS — E119 Type 2 diabetes mellitus without complications: Secondary | ICD-10-CM | POA: Diagnosis not present

## 2024-08-07 DIAGNOSIS — Z8673 Personal history of transient ischemic attack (TIA), and cerebral infarction without residual deficits: Secondary | ICD-10-CM | POA: Diagnosis not present

## 2024-08-07 DIAGNOSIS — N186 End stage renal disease: Secondary | ICD-10-CM | POA: Diagnosis not present

## 2024-08-07 LAB — CBC
HCT: 24.8 % — ABNORMAL LOW (ref 36.0–46.0)
Hemoglobin: 8.3 g/dL — ABNORMAL LOW (ref 12.0–15.0)
MCH: 26.5 pg (ref 26.0–34.0)
MCHC: 33.5 g/dL (ref 30.0–36.0)
MCV: 79.2 fL — ABNORMAL LOW (ref 80.0–100.0)
Platelets: 223 K/uL (ref 150–400)
RBC: 3.13 MIL/uL — ABNORMAL LOW (ref 3.87–5.11)
RDW: 16.7 % — ABNORMAL HIGH (ref 11.5–15.5)
WBC: 9.9 K/uL (ref 4.0–10.5)
nRBC: 0 % (ref 0.0–0.2)

## 2024-08-07 LAB — RENAL FUNCTION PANEL
Albumin: 2.6 g/dL — ABNORMAL LOW (ref 3.5–5.0)
Anion gap: 18 — ABNORMAL HIGH (ref 5–15)
BUN: 78 mg/dL — ABNORMAL HIGH (ref 6–20)
CO2: 23 mmol/L (ref 22–32)
Calcium: 6.7 mg/dL — ABNORMAL LOW (ref 8.9–10.3)
Chloride: 99 mmol/L (ref 98–111)
Creatinine, Ser: 11.05 mg/dL — ABNORMAL HIGH (ref 0.44–1.00)
GFR, Estimated: 4 mL/min — ABNORMAL LOW (ref >60)
Glucose, Bld: 149 mg/dL — ABNORMAL HIGH (ref 70–99)
Phosphorus: 5.4 mg/dL — ABNORMAL HIGH (ref 2.5–4.6)
Potassium: 4.8 mmol/L (ref 3.5–5.1)
Sodium: 140 mmol/L (ref 135–145)

## 2024-08-07 LAB — GLUCOSE, CAPILLARY
Glucose-Capillary: 173 mg/dL — ABNORMAL HIGH (ref 70–99)
Glucose-Capillary: 216 mg/dL — ABNORMAL HIGH (ref 70–99)
Glucose-Capillary: 215 mg/dL — ABNORMAL HIGH (ref 70–99)
Glucose-Capillary: 116 mg/dL — ABNORMAL HIGH (ref 70–99)
Glucose-Capillary: 121 mg/dL — ABNORMAL HIGH (ref 70–99)

## 2024-08-07 LAB — BASIC METABOLIC PANEL WITH GFR
Anion gap: 10 (ref 5–15)
BUN: 77 mg/dL — ABNORMAL HIGH (ref 6–20)
CO2: 28 mmol/L (ref 22–32)
Calcium: 6.7 mg/dL — ABNORMAL LOW (ref 8.9–10.3)
Chloride: 99 mmol/L (ref 98–111)
Creatinine, Ser: 10.98 mg/dL — ABNORMAL HIGH (ref 0.44–1.00)
GFR, Estimated: 4 mL/min — ABNORMAL LOW (ref >60)
Glucose, Bld: 188 mg/dL — ABNORMAL HIGH (ref 70–99)
Potassium: 4.5 mmol/L (ref 3.5–5.1)
Sodium: 137 mmol/L (ref 135–145)

## 2024-08-07 LAB — HEPATITIS B SURFACE ANTIBODY, QUANTITATIVE: Hep B S AB Quant (Post): 27.4 m[IU]/mL

## 2024-08-07 MED ORDER — PANTOPRAZOLE SODIUM 40 MG PO TBEC
40.0000 mg | DELAYED_RELEASE_TABLET | Freq: Every day | ORAL | Status: DC
  Administered 2024-08-07 – 2024-08-14 (×8): 40 mg via ORAL
  Filled 2024-08-07 (×8): qty 1

## 2024-08-07 MED ORDER — DARBEPOETIN ALFA 60 MCG/0.3ML IJ SOSY
60.0000 ug | PREFILLED_SYRINGE | INTRAMUSCULAR | Status: DC
  Administered 2024-08-07: 60 ug via SUBCUTANEOUS
  Filled 2024-08-07 (×3): qty 0.3

## 2024-08-07 NOTE — Progress Notes (Signed)
   08/07/24 1324  Spiritual Encounters  Type of Visit Initial  Care provided to: Patient  Conversation partners present during encounter Nurse  Reason for visit Routine spiritual support  OnCall Visit No   The chaplain visited the patient for routine spiritual support. The patient was with the nurse and eating lunch at the time so I just introduced myself and said that someone would be back to see her later today.

## 2024-08-07 NOTE — Progress Notes (Signed)
 PROGRESS NOTE        PATIENT DETAILS Name: Stacey Lucas Age: 48 y.o. Sex: female Date of Birth: Mar 30, 1976 Admit Date: 08/06/2024 Admitting Physician Evalene GORMAN Sprinkles, MD ERE:Wztopw, [name], MD  Brief Summary: Patient is a 48 y.o.  female with history of prior CVA with mild right-sided hemiparesis, ESRD on HD, HTN, DM-2-homelessness-who apparently moving to [facility] recently-has not had dialysis in several weeks-presented to the hospital with fatigue/malaise-found to have hyperkalemia.  Significant events: 11/28>> admit to [facility]  Significant studies: 11/28>> CXR: No PNA  Significant microbiology data: None  Procedures: None  Consults: Nephrology  Subjective: Feels nauseous-no other complaints.  Objective: Vitals: Blood pressure (!) 147/62, pulse 80, temperature 98.6 F (37 C), temperature source Oral, resp. rate 14, height 5' 5 (1.651 m), weight 67 kg, last menstrual period 10/11/2019, SpO2 100%.   Exam: Gen Exam:not in any distress HEENT:atraumatic, normocephalic Chest: B/L clear to auscultation anteriorly CVS:S1S2 regular Abdomen:soft non tender, non distended Extremities:no edema Neurology: Non focal Skin: no rash  Pertinent Labs/Radiology:    Latest Ref Rng & Units 08/07/2024    5:35 AM 08/06/2024    4:34 AM 08/06/2024    1:06 AM  CBC  WBC 4.0 - 10.5 K/uL 9.9  9.6  10.0   Hemoglobin 12.0 - 15.0 g/dL 8.3  9.1  9.6   Hematocrit 36.0 - 46.0 % 24.8  27.1  29.2   Platelets 150 - 400 K/uL 223  262  271     Lab Results  Component Value Date   NA 137 08/07/2024   K 4.5 08/07/2024   CL 99 08/07/2024   CO2 28 08/07/2024    Assessment/Plan: Hyperkalemia Secondary to missed HD Treated with Lokelma  and then with HD Potassium is normalized.  ESRD on HD Missed several weeks of hemodialysis Unfortunately-patient does not have a dedicated outpatient HD center-she is homeless-and apparently comes to the ED quite a bit for  dialysis. Underwent HD yesterday-plans are for HD again today. Will need TOC/diabetic coordinator evaluation prior to discharge.  Normocytic anemia Likely related to ESRD No evidence of blood loss. Aranesp /Iron  defer to nephrology service.  HTN BP relatively stable Continue amlodipine /Coreg   HLD Statin  DM-2 (A1c 5.6 on 10/28) CBG stable on SSI  Recent Labs    08/06/24 1704 08/06/24 2040 08/07/24 0759  GLUCAP 296* 181* 173*     PAF Telemetry monitoring Eliquis   History of CVA with mild right-sided hemiparesis/dysarthria Stable Apparently does use cane/walker Eliquis /statin. Await PT/OT eval.  Homelessness TOC team following  Code status:   Code Status: Limited: Do not attempt resuscitation (DNR) -DNR-LIMITED -Do Not Intubate/DNI    DVT Prophylaxis: apixaban  (ELIQUIS ) tablet 2.5 mg Start: 08/06/24 1000 apixaban  (ELIQUIS ) tablet 2.5 mg    Family Communication: None at bedside   Disposition Plan: Status is: Inpatient Remains inpatient appropriate because: Severity of illness   Planned Discharge Destination:Home   Diet: Diet Order             Diet renal with fluid restriction Fluid restriction: 1200 mL Fluid; Room service appropriate? Yes; Fluid consistency: Thin  Diet effective now                     Antimicrobial agents: Anti-infectives (From admission, onward)    None        MEDICATIONS: Scheduled Meds:  amLODipine   10 mg  Oral Daily   apixaban   2.5 mg Oral BID   atorvastatin   40 mg Oral Daily   carvedilol   6.25 mg Oral BID WC   Chlorhexidine  Gluconate Cloth  6 each Topical Q0600   darbepoetin (ARANESP ) injection - DIALYSIS  60 mcg Subcutaneous Q Sat-1800   hydrALAZINE   25 mg Oral BID   insulin  aspart  0-5 Units Subcutaneous QHS   insulin  aspart  0-6 Units Subcutaneous TID WC   pantoprazole   40 mg Oral Daily   sodium chloride  flush  3 mL Intravenous Q12H   Continuous Infusions: PRN Meds:.acetaminophen  **OR** acetaminophen ,  senna   I have personally reviewed following labs and imaging studies  LABORATORY DATA: CBC: Recent Labs  Lab 08/06/24 0106 08/06/24 0434 08/07/24 0535  WBC 10.0 9.6 9.9  HGB 9.6* 9.1* 8.3*  HCT 29.2* 27.1* 24.8*  MCV 80.7 78.8* 79.2*  PLT 271 262 223    Basic Metabolic Panel: Recent Labs  Lab 08/06/24 0106 08/06/24 0434 08/07/24 0535  NA 140 142 137  K 5.9* 5.4* 4.5  CL 99 101 99  CO2 19* 19* 28  GLUCOSE 180* 209* 188*  BUN 126* 127* 77*  CREATININE 16.38* 16.26* 10.98*  CALCIUM  6.8* 6.6* 6.7*  PHOS  --  8.4*  --     GFR: Estimated Creatinine Clearance: 5.6 mL/min (A) (by C-G formula based on SCr of 10.98 mg/dL (H)).  Liver Function Tests: No results for input(s): AST, ALT, ALKPHOS, BILITOT, PROT, ALBUMIN  in the last 168 hours. No results for input(s): LIPASE, AMYLASE in the last 168 hours. No results for input(s): AMMONIA in the last 168 hours.  Coagulation Profile: No results for input(s): INR, PROTIME in the last 168 hours.  Cardiac Enzymes: No results for input(s): CKTOTAL, CKMB, CKMBINDEX, TROPONINI in the last 168 hours.  BNP (last 3 results) No results for input(s): PROBNP in the last 8760 hours.  Lipid Profile: No results for input(s): CHOL, HDL, LDLCALC, TRIG, CHOLHDL, LDLDIRECT in the last 72 hours.  Thyroid Function Tests: No results for input(s): TSH, T4TOTAL, FREET4, T3FREE, THYROIDAB in the last 72 hours.  Anemia Panel: Recent Labs    08/06/24 0434  VITAMINB12 633  FOLATE 13.4  FERRITIN 773*  TIBC 200*  IRON  126  RETICCTPCT 0.6    Urine analysis:    Component Value Date/Time   COLORURINE AMBER (A) 11/14/2022 1902   APPEARANCEUR CLOUDY (A) 11/14/2022 1902   LABSPEC 1.018 11/14/2022 1902   PHURINE 7.0 11/14/2022 1902   GLUCOSEU NEGATIVE 11/14/2022 1902   HGBUR NEGATIVE 11/14/2022 1902   BILIRUBINUR NEGATIVE 11/14/2022 1902   KETONESUR 5 (A) 11/14/2022 1902   PROTEINUR  >=300 (A) 11/14/2022 1902   NITRITE NEGATIVE 11/14/2022 1902   LEUKOCYTESUR MODERATE (A) 11/14/2022 1902    Sepsis Labs: Lactic Acid, Venous    Component Value Date/Time   LATICACIDVEN 0.52 12/29/2016 1522    MICROBIOLOGY: Recent Results (from the past 240 hours)  MRSA Next Gen by PCR, Nasal     Status: None   Collection Time: 08/06/24  4:47 AM   Specimen: Nasal Mucosa; Nasal Swab  Result Value Ref Range Status   MRSA by PCR Next Gen NOT DETECTED NOT DETECTED Final    Comment: (NOTE) The GeneXpert MRSA Assay (FDA approved for NASAL specimens only), is one component of a comprehensive MRSA colonization surveillance program. It is not intended to diagnose MRSA infection nor to guide or monitor treatment for MRSA infections. Test performance is not FDA approved in patients less  than 40 years old. Performed at [facility], [address], [place], [place] [address]     RADIOLOGY STUDIES/RESULTS: DG Chest 2 View Result Date: 08/06/2024 EXAM: 2 VIEW(S) XRAY OF THE CHEST 08/06/2024 01:22:00 AM COMPARISON: Comparison with 05/26/2024. CLINICAL HISTORY: chest pain FINDINGS: LUNGS AND PLEURA: Lungs are clear. No pleural effusion. No pneumothorax. HEART AND MEDIASTINUM: Heart size and pulmonary vascularity are normal. Mediastinal contours appear intact. BONES AND SOFT TISSUES: No acute osseous abnormality. IMPRESSION: 1. No acute cardiopulmonary pathology. Electronically signed by: Elsie Gravely MD 08/06/2024 01:32 AM EST RP Workstation: [id number]     LOS: 1 day   Donalda Applebaum, MD  [facility]    To contact the attending provider between 7A-7P or the covering provider during after hours 7P-7A, please log into the web site [email or web address] and access using universal [place] password for that web site. If you do not have the password, please call the hospital operator.  08/07/2024, 8:58 AM

## 2024-08-07 NOTE — Progress Notes (Signed)
   08/07/24 1652  Spiritual Encounters  Type of Visit Initial  Care provided to: Patient  Conversation partners present during encounter Nurse  Reason for visit Urgent spiritual support  OnCall Visit No   This chaplain visited the patient to deliver urgent spiritual support. She said that she had no reason to continue living and did not want to. I sat with her and encouraged her but recommend additional spiritual support. She is homeless with no familial support. I recommend additional spiritual resources for her.

## 2024-08-07 NOTE — Progress Notes (Signed)
 [facility] Progress Note   Subjective:   Pt seen in room this AM. Reports HD went well yesterday but still feeling generally unwell, weak and tired. Reports unchanged since yesterday. Denies SOB, CP, HA, dizziness, nausea.   Objective Vitals:   08/07/24 0831 08/07/24 0840 08/07/24 0900 08/07/24 0931  BP: (!) 142/64 (!) 147/62 130/67 (!) 141/63  Pulse: 100 80 83 81  Resp:  14 18 15   Temp: 98.6 F (37 C)     TempSrc: Oral     SpO2: 100% 100% 100% 100%  Weight:      Height:       Physical Exam General: alert female in NAD Heart: RRR, no murmurs Lungs: CTA bilaterally, respirations unlabored on RA Abdomen: Soft, non-distended, +BS Extremities: No edema b/l lower extremities Dialysis Access: AVF + t/b   Additional Objective Labs: Basic Metabolic Panel: Recent Labs  Lab 08/06/24 0434 08/07/24 0535 08/07/24 0732  NA 142 137 140  K 5.4* 4.5 4.8  CL 101 99 99  CO2 19* 28 23  GLUCOSE 209* 188* 149*  BUN 127* 77* 78*  CREATININE 16.26* 10.98* 11.05*  CALCIUM  6.6* 6.7* 6.7*  PHOS 8.4*  --  5.4*   Liver Function Tests: Recent Labs  Lab 08/07/24 0732  ALBUMIN  2.6*   No results for input(s): LIPASE, AMYLASE in the last 168 hours. CBC: Recent Labs  Lab 08/06/24 0106 08/06/24 0434 08/07/24 0535  WBC 10.0 9.6 9.9  HGB 9.6* 9.1* 8.3*  HCT 29.2* 27.1* 24.8*  MCV 80.7 78.8* 79.2*  PLT 271 262 223   Blood Culture    Component Value Date/Time   SDES URINE, RANDOM 11/14/2022 1902   SPECREQUEST  11/14/2022 1902    NONE Reflexed from [id number] Performed at [facility], [address] [place], [place] [address]    CULT >=100,000 COLONIES/mL ESCHERICHIA COLI (A) 11/14/2022 1902   REPTSTATUS 11/17/2022 FINAL 11/14/2022 1902    Cardiac Enzymes: No results for input(s): CKTOTAL, CKMB, CKMBINDEX, TROPONINI in the last 168 hours. CBG: Recent Labs  Lab 08/06/24 0739 08/06/24 1445 08/06/24 1704 08/06/24 2040 08/07/24 0759  GLUCAP  283* 216* 296* 181* 173*   Iron  Studies:  Recent Labs    08/06/24 0434  IRON  126  TIBC 200*  FERRITIN 773*   @lablastinr3 @ Studies/Results: DG Chest 2 View Result Date: 08/06/2024 EXAM: 2 VIEW(S) XRAY OF THE CHEST 08/06/2024 01:22:00 AM COMPARISON: Comparison with 05/26/2024. CLINICAL HISTORY: chest pain FINDINGS: LUNGS AND PLEURA: Lungs are clear. No pleural effusion. No pneumothorax. HEART AND MEDIASTINUM: Heart size and pulmonary vascularity are normal. Mediastinal contours appear intact. BONES AND SOFT TISSUES: No acute osseous abnormality. IMPRESSION: 1. No acute cardiopulmonary pathology. Electronically signed by: Elsie Gravely MD 08/06/2024 01:32 AM EST RP Workstation: [id number]   Medications:   amLODipine   10 mg Oral Daily   apixaban   2.5 mg Oral BID   atorvastatin   40 mg Oral Daily   carvedilol   6.25 mg Oral BID WC   Chlorhexidine  Gluconate Cloth  6 each Topical Q0600   darbepoetin (ARANESP ) injection - DIALYSIS  60 mcg Subcutaneous Q Sat-1800   hydrALAZINE   25 mg Oral BID   insulin  aspart  0-5 Units Subcutaneous QHS   insulin  aspart  0-6 Units Subcutaneous TID WC   pantoprazole   40 mg Oral Daily   sodium chloride  flush  3 mL Intravenous Q12H    Dialysis Orders: previously [place], no HD unit now \   Assessment/Plan:  Hyperkalemia: Improved with lokelma   and HD, planned for dialysis again today.   ESRD:  No dialysis for 2 weeks. Had short HD Friday and will have serial HD today. Will need an outpatient HD unit. Renal navigators are off today, will initiate CLIP process on Monday.   Hypertension/volume: BP elevated, home meds resumed  Anemia: Hgb 8.3. Will start aranesp .   Metabolic bone disease: Calcium  low, will check PTH and start VDRA as needed. Phos now at goal, continue binders.   Nutrition:  Will need renal diet and fluid restrictions.   Lucie Collet, PA-C 08/07/2024, 9:39 AM  [facility] Pager: [phone]

## 2024-08-07 NOTE — Progress Notes (Signed)
 Talking with patient about her missing her dialysis treatments. She started crying and stated that she felt depressed. She said it was hard this time of year and would like to speak to chaplin. Referral placed for patient

## 2024-08-07 NOTE — Plan of Care (Signed)

## 2024-08-07 NOTE — Progress Notes (Addendum)
  Received patient in bed to unit.   Informed consent signed and in chart.    TX duration:3     Transported by  Hand-off given to patient's nurse.    Access used: LEFT AVF Access issues: Arterial site bleed for 10 mins   Total UF removed: 500 Medication(s) given: Protonix  PO Post HD VS: 154/96 Post HD weight: 63.0   Shaya Reddick LPN [facility]

## 2024-08-07 NOTE — Procedures (Signed)
 I was present at this dialysis session. I have reviewed the session itself and made appropriate changes.   Vital signs in last 24 hours:  Temp:  [98.1 F (36.7 C)-99.1 F (37.3 C)] 98.6 F (37 C) (11/29 0831) Pulse Rate:  [80-107] 80 (11/29 0840) Resp:  [14-24] 14 (11/29 0840) BP: (122-165)/(56-74) 147/62 (11/29 0840) SpO2:  [97 %-100 %] 100 % (11/29 0840) Weight:  [64.5 kg-67 kg] 67 kg (11/28 1228) Weight change: -3.539 kg Filed Weights   08/06/24 0420 08/06/24 0920 08/06/24 1228  Weight: 64.5 kg 64.5 kg 67 kg    Recent Labs  Lab 08/06/24 0434 08/07/24 0535  NA 142 137  K 5.4* 4.5  CL 101 99  CO2 19* 28  GLUCOSE 209* 188*  BUN 127* 77*  CREATININE 16.26* 10.98*  CALCIUM  6.6* 6.7*  PHOS 8.4*  --     Recent Labs  Lab 08/06/24 0106 08/06/24 0434 08/07/24 0535  WBC 10.0 9.6 9.9  HGB 9.6* 9.1* 8.3*  HCT 29.2* 27.1* 24.8*  MCV 80.7 78.8* 79.2*  PLT 271 262 223    Scheduled Meds:  amLODipine   10 mg Oral Daily   apixaban   2.5 mg Oral BID   atorvastatin   40 mg Oral Daily   carvedilol   6.25 mg Oral BID WC   Chlorhexidine  Gluconate Cloth  6 each Topical Q0600   hydrALAZINE   25 mg Oral BID   insulin  aspart  0-5 Units Subcutaneous QHS   insulin  aspart  0-6 Units Subcutaneous TID WC   sodium chloride  flush  3 mL Intravenous Q12H   Continuous Infusions: PRN Meds:.acetaminophen  **OR** acetaminophen , senna   Stacey DELENA Sellar,  MD 08/07/2024, 8:51 AM

## 2024-08-07 NOTE — Progress Notes (Signed)
 PT Cancellation Note  Patient Details Name: Stacey Lucas MRN: [id number] DOB: [birth year]   Cancelled Treatment:    Reason Eval/Treat Not Completed: Patient at procedure or test/unavailable (Pt off the floor at dialysis. Will follow later if time allows.)   Clothilde Tippetts 08/07/2024, 8:32 AM

## 2024-08-08 DIAGNOSIS — N186 End stage renal disease: Secondary | ICD-10-CM | POA: Diagnosis not present

## 2024-08-08 DIAGNOSIS — F331 Major depressive disorder, recurrent, moderate: Secondary | ICD-10-CM

## 2024-08-08 DIAGNOSIS — F339 Major depressive disorder, recurrent, unspecified: Secondary | ICD-10-CM

## 2024-08-08 DIAGNOSIS — F1494 Cocaine use, unspecified with cocaine-induced mood disorder: Secondary | ICD-10-CM

## 2024-08-08 DIAGNOSIS — Z992 Dependence on renal dialysis: Secondary | ICD-10-CM | POA: Diagnosis not present

## 2024-08-08 LAB — RENAL FUNCTION PANEL
Albumin: 2.5 g/dL — ABNORMAL LOW (ref 3.5–5.0)
Anion gap: 12 (ref 5–15)
BUN: 45 mg/dL — ABNORMAL HIGH (ref 6–20)
CO2: 27 mmol/L (ref 22–32)
Calcium: 7.1 mg/dL — ABNORMAL LOW (ref 8.9–10.3)
Chloride: 96 mmol/L — ABNORMAL LOW (ref 98–111)
Creatinine, Ser: 7.42 mg/dL — ABNORMAL HIGH (ref 0.44–1.00)
GFR, Estimated: 6 mL/min — ABNORMAL LOW (ref >60)
Glucose, Bld: 141 mg/dL — ABNORMAL HIGH (ref 70–99)
Phosphorus: 4.4 mg/dL (ref 2.5–4.6)
Potassium: 5 mmol/L (ref 3.5–5.1)
Sodium: 135 mmol/L (ref 135–145)

## 2024-08-08 LAB — CBC
HCT: 25.2 % — ABNORMAL LOW (ref 36.0–46.0)
Hemoglobin: 8.3 g/dL — ABNORMAL LOW (ref 12.0–15.0)
MCH: 26.3 pg (ref 26.0–34.0)
MCHC: 32.9 g/dL (ref 30.0–36.0)
MCV: 79.7 fL — ABNORMAL LOW (ref 80.0–100.0)
Platelets: 213 K/uL (ref 150–400)
RBC: 3.16 MIL/uL — ABNORMAL LOW (ref 3.87–5.11)
RDW: 16.7 % — ABNORMAL HIGH (ref 11.5–15.5)
WBC: 8.9 K/uL (ref 4.0–10.5)
nRBC: 0 % (ref 0.0–0.2)

## 2024-08-08 LAB — GLUCOSE, CAPILLARY
Glucose-Capillary: 149 mg/dL — ABNORMAL HIGH (ref 70–99)
Glucose-Capillary: 299 mg/dL — ABNORMAL HIGH (ref 70–99)
Glucose-Capillary: 321 mg/dL — ABNORMAL HIGH (ref 70–99)
Glucose-Capillary: 209 mg/dL — ABNORMAL HIGH (ref 70–99)

## 2024-08-08 MED ORDER — MELATONIN 5 MG PO TABS
5.0000 mg | ORAL_TABLET | Freq: Every day | ORAL | Status: DC
  Administered 2024-08-08 – 2024-08-13 (×6): 5 mg via ORAL
  Filled 2024-08-08 (×6): qty 1

## 2024-08-08 MED ORDER — SERTRALINE HCL 50 MG PO TABS
50.0000 mg | ORAL_TABLET | Freq: Every day | ORAL | Status: DC
  Administered 2024-08-08 – 2024-08-10 (×3): 50 mg via ORAL
  Filled 2024-08-08 (×3): qty 1

## 2024-08-08 MED ORDER — ISOSORBIDE MONONITRATE ER 30 MG PO TB24
30.0000 mg | ORAL_TABLET | Freq: Every day | ORAL | Status: DC
  Administered 2024-08-08 – 2024-08-14 (×7): 30 mg via ORAL
  Filled 2024-08-08 (×7): qty 1

## 2024-08-08 MED ORDER — BUPROPION HCL ER (XL) 150 MG PO TB24: 150.0000 mg | ORAL_TABLET | Freq: Every morning | ORAL | Status: DC

## 2024-08-08 MED ORDER — CHLORHEXIDINE GLUCONATE CLOTH 2 % EX PADS
6.0000 | MEDICATED_PAD | Freq: Every day | CUTANEOUS | Status: DC
  Administered 2024-08-08 – 2024-08-09 (×2): 6 via TOPICAL

## 2024-08-08 NOTE — Evaluation (Addendum)
 Physical Therapy Evaluation Patient Details Name: Stacey Lucas MRN: [id number] DOB: [birth year] Today's Date: 08/08/2024  History of Present Illness  48 yo female presenting to ED requesting HD (missing HD for 2 weeks) pt workup reveals hyperkalemia.  PMH CVA L pons, L basal ganglia, anxiety depression suicidal ideations, DM, HLD HTN migraines cocaine abuse, ESRD on HD, PAF on eliquis ,   Clinical Impression  PTA pt would perform stand or squat-pivots to a manual WC for mobility. Pt occasionally needed assist with LB dressing and bathing. Pt has residual R sided weakness from prior cva and has been unable to walk for many years. Pt required MinA for bed mobility and CGA to stand-pivot to the left. Pt reported being interested in further rehab to work on strength and independence with mobility/ADLs. At this time, recommending <3hrs post acute rehab with TOC following regarding d/c planning. Acute PT to follow.       If plan is discharge home, recommend the following: A little help with bathing/dressing/bathroom;Assist for transportation;Help with stairs or ramp for entrance     Equipment Recommendations None recommended by PT     Functional Status Assessment Patient has had a recent decline in their functional status and demonstrates the ability to make significant improvements in function in a reasonable and predictable amount of time.     Precautions / Restrictions Precautions Precautions: Fall Recall of Precautions/Restrictions: Intact Restrictions Weight Bearing Restrictions Per Provider Order: No      Mobility  Bed Mobility Overal bed mobility: Needs Assistance Bed Mobility: Supine to Sit    Supine to sit: Min assist    General bed mobility comments: MinA for trunk raise    Transfers Overall transfer level: Needs assistance Equipment used: None Transfers: Bed to chair/wheelchair/BSC, Sit to/from Stand Sit to Stand: Contact guard assist Stand pivot transfers: Contact  guard assist    General transfer comment: performed stand-pivot to the left with one hand on recliner handle. CGA for safety    Ambulation/Gait  General Gait Details: does not ambulate at baseline     Balance Overall balance assessment: Needs assistance, Mild deficits observed, not formally tested Sitting-balance support: No upper extremity supported, Feet supported Sitting balance-Leahy Scale: Good    Standing balance support: Single extremity supported, During functional activity, Reliant on assistive device for balance Standing balance-Leahy Scale: Poor       Pertinent Vitals/Pain Pain Assessment Pain Assessment: No/denies pain    Home Living Family/patient expects to be discharged to:: Unsure    Additional Comments: Pt reported she may be able to stay with a friend who could help with bathing/dressing. If staying with friend, will have 2 steps to enter with no handrails. Has manual WC    Prior Function Prior Level of Function : Needs assist   Mobility Comments: Performed stand-pivots to manual WC at baseline ADLs Comments: Occasional assist for LB dressing and for bathing     Extremity/Trunk Assessment   Upper Extremity Assessment Upper Extremity Assessment: Defer to OT evaluation    Lower Extremity Assessment Lower Extremity Assessment: RLE deficits/detail;LLE deficits/detail RLE Deficits / Details: Hip flexion 3+/5, Knee ext 4/5, Ankle DF 4+/5 in limited ROM RLE Sensation: WNL LLE Deficits / Details: Hip flexion 4/5, knee ext 4+/5, Ankle DF 5/5 LLE Sensation: WNL    Cervical / Trunk Assessment Cervical / Trunk Assessment: Normal  Communication   Communication Communication: No apparent difficulties    Cognition Arousal: Alert Behavior During Therapy: WFL for tasks assessed/performed, Flat affect   PT -  Cognitive impairments: No apparent impairments    Following commands: Intact       Cueing Cueing Techniques: Verbal cues      PT Assessment  Patient needs continued PT services  PT Problem List Decreased strength;Decreased range of motion;Decreased activity tolerance;Decreased balance;Decreased mobility       PT Treatment Interventions DME instruction;Gait training;Therapeutic activities;Functional mobility training;Therapeutic exercise;Balance training;Neuromuscular re-education;Patient/family education;Wheelchair mobility training    PT Goals (Current goals can be found in the Care Plan section)  Acute Rehab PT Goals Patient Stated Goal: to get better PT Goal Formulation: With patient Time For Goal Achievement: 08/22/24 Potential to Achieve Goals: Good    Frequency Min 1X/week        AM-PAC PT 6 Clicks Mobility  Outcome Measure Help needed turning from your back to your side while in a flat bed without using bedrails?: A Little Help needed moving from lying on your back to sitting on the side of a flat bed without using bedrails?: A Little Help needed moving to and from a bed to a chair (including a wheelchair)?: A Little Help needed standing up from a chair using your arms (e.g., wheelchair or bedside chair)?: A Little Help needed to walk in hospital room?: Total Help needed climbing 3-5 steps with a railing? : Total 6 Click Score: 14    End of Session   Activity Tolerance: Patient tolerated treatment well Patient left: in chair;with call bell/phone within reach;Other (comment);with nursing/sitter in room (NT sitter present outside of room, let NT know that the chair alarm pad was underneath the pt, but not plugged in) Nurse Communication: Mobility status PT Visit Diagnosis: Other abnormalities of gait and mobility (R26.89);Unsteadiness on feet (R26.81);Muscle weakness (generalized) (M62.81)    Time: 8647-8591 PT Time Calculation (min) (ACUTE ONLY): 16 min   Charges:   PT Evaluation $PT Eval Low Complexity: 1 Low   PT General Charges $$ ACUTE PT VISIT: 1 Visit       Kate ORN, PT, [name] Secure Chat  Preferred  Rehab Office [phone]  Kate BRAVO Wendolyn 08/08/2024, 3:37 PM

## 2024-08-08 NOTE — Consult Note (Signed)
 [facility] Psychiatric Consult Initial  Patient Name: .Stacey Lucas  MRN: [id number]  DOB: 12-18-1975  Consult Order details:  Orders (From admission, onward)     Start     Ordered   08/07/24 1427  IP CONSULT TO PSYCHIATRY       Ordering Provider: Raenelle Donalda HERO, MD  Provider:  (Not yet assigned)  Question Answer Comment  Location [facility]   Reason for Consult? Depression      08/07/24 1427             Mode of Visit: In person    Psychiatry Consult Evaluation  Service Date: August 08, 2024 LOS:  LOS: 2 days  Chief Complaint I have been feeling more depressed and nervous since I found out my husband of 10 years was cheating on me.  Primary Psychiatric Diagnoses  Major depressive disorder, recurrent  2.  Cocaine use disorder and cocaine induced mood disorder.   Assessment  Stacey Lucas is a 48 y.o. female admitted: Medically on 08/06/2024 12:39 AM for fatigue/malaise after missing her dialysis for 2 weeks. She carries the psychiatric diagnoses of Cocaine use disorder and cocaine induced mood disorder and has a past medical history prior CVA with mild right-sided hemiparesis, ESRD on HD, HTN, and DM-2. Psychiatry consulted for evaluation of depression.'   Her current presentation of depressed mood, low energy level, lack of motivation, poor sleep, nervousness, apprehension and crying spells which is getting progressively worse in the context of her current homelessness after moving out of husband home is most consistent with major depressive disorder. A diagnosis of cocaine use disorder and cocaine induced mood disorder is also a consideration due to her history of chronic cocaine abuse and previous mood symptoms. She does not  meet criteria for inpatient psychiatric admission due to absent suicidal or homicidal ideation, intent or plan and her ability to contract for safety. Patient denies previous suicide attempts or self-injurious behavior. She's  not currently taking any psychotropic medications and does not follow up with any psychiatrist. Records shows patient was previously on Bupropion  XL 150 mg daily and Hydroxyzine  25 mg twice daily but patient is not compliant with medications prior to admission as evidenced by reports from the patient. On initial examination, patient is awake, alert and oriented x 4. Her speech is low volume with depressed mood and constricted and tearful. Thought process is linear and coherent with no perceptual abnormalities. Denies SI/HI. Please see plan below for detailed recommendations.   Diagnoses:  Active Hospital problems: Principal Problem:   ESRD needing dialysis (HCC) Active Problems:   DM2 (diabetes mellitus, type 2) (HCC)   Paroxysmal atrial fibrillation (HCC)   H/O: stroke   Anemia   Cerebrovascular accident (CVA) (HCC)   Hyperkalemia   Prolonged QT interval    Plan   ## Psychiatric Medication Recommendations:  --Start Zoloft  50 mg daily for depression/anxiety. This is considered relatively safe in this patient with prolonged Qtc (551) and CKD. Please monitor Qtc daily.  --Add Melatonin 5 mg at bedtime for sleep --Continue medical treatment as the primary team   ## Medical Decision Making Capacity: Not specifically addressed in this encounter  ## Further Work-up:  --  TSH, B12, folate -- most recent EKG on 08/06/24 had QtC of 551. Please monitor QTC daily.  -- Pertinent labwork reviewed earlier this admission includes:BUN-45, CR-7.4, HB-8.3, GFR-6   ## Disposition:-- There are no psychiatric contraindications to discharge at this time  ## Behavioral / Environmental: -  No specific recommendations at this time.     ## Safety and Observation Level:  - Based on my clinical evaluation, I estimate the patient to be at low risk of self harm in the current setting. - At this time, we recommend  routine. This decision is based on my review of the chart including patient's history and  current presentation, interview of the patient, mental status examination, and consideration of suicide risk including evaluating suicidal ideation, plan, intent, suicidal or self-harm behaviors, risk factors, and protective factors. This judgment is based on our ability to directly address suicide risk, implement suicide prevention strategies, and develop a safety plan while the patient is in the clinical setting. Please contact our team if there is a concern that risk level has changed.  CSSR Risk Category:C-SSRS RISK CATEGORY: Moderate Risk  Suicide Risk Assessment: Patient has following modifiable risk factors for suicide: untreated depression, recklessness, and medication noncompliance, which we are addressing by prescribing medications. Patient has following non-modifiable or demographic risk factors for suicide: separation or divorce Patient has the following protective factors against suicide: Supportive family and Cultural, spiritual, or religious beliefs that discourage suicide  Thank you for this consult request. Recommendations have been communicated to the primary team.  We will follow up at this time.   Jan DELENA Donath, MD       History of Present Illness  Relevant Aspects of [facility] Course:  Admitted on 08/06/2024 after missing her dialysis for 2 weeks.    Patient Report:  Patient seen face to face in her hospital room. She is awake, alert and oriented to time, place, person and situation. When asked about the reason for this admission, patient states she missed her dialysis for two weeks and became weak and fatigued, necessitating her presentation to the hospital. On further enquiry, patient states she's been feeling overwhelmed, depressed, worry, apprehensive, nervous and has difficulty sleeping for over 2-3 months because her husband of 10 years was cheating on her and brought the lady into their home. As a result, patient states she had to move out of the house and  currently lives with a friend. Patient reports difficulty sleeping, low energy level, and lack of motivation, but denies suicidal or homicidal ideation, intent or plan. She denies previous history of suicide attempts or self-injurious behavior. Patient states she would like to be alive for her 68 adult children aged 13, 75 and 39 years whom she often communicates with..    Patient denies auditory or visual hallucinations and denies delusions. She denies previous history of physical or sexual abuse, and denies nightmares or intrusive thoughts. When asked about drug use, patient states she's been using cocaine and smoke cigarette for years saying using cocaine is the only way I get through my day. This writer advised patient to stay away from drug use considering its adverse effects on her medical and mental health.     Review of Systems  Psychiatric/Behavioral:  Positive for depression and substance abuse. Negative for hallucinations and suicidal ideas. The patient is nervous/anxious and has insomnia.      Psychiatric and Social History  Psychiatric History:  Information collected from the patient  Prev Dx/Sx: Cocaine use disorder and cocaine induced mood disorder Current Psych Provider: none Home Meds (current):Bupropion  XL 150 mg but does not use.  Previous Med Trials: Duloxetine  30 mg daily Therapy: none  Prior Psych Hospitalization: patient denies  Prior Self Harm: Patient denies Prior Violence:Patient denies  Family Psych History: denies Family  Hx suicide:denies  Social History:  Educational Hx: dropped out of high school Occupational Hx: Currently unemployed, on disability benefit. Used to work as advertising copywriter at [employer] Hx: patient denies Living Situation: Currently homeless-Lives with her friend after moving out of her husband home for cheating on her. Spiritual Hx: unsure Access to weapons/lethal means: patient denies    Substance History Alcohol: denies   Type of alcohol N/A Last Drink denies Number of drinks per day denies History of alcohol withdrawal seizures N/A History of DT's N/A Tobacco: Yes, smokes half pack of cigarette daily since age 83 years Illicit drugs: Yes, cocaine Prescription drug abuse: denies Rehab hx: denies  Exam Findings  Physical Exam:  Vital Signs:  Temp:  [98.7 F (37.1 C)-99.4 F (37.4 C)] 99 F (37.2 C) (11/30 0813) Pulse Rate:  [76-89] 76 (11/30 0341) Resp:  [14-19] 16 (11/30 0339) BP: (111-155)/(53-78) 144/62 (11/30 0813) SpO2:  [99 %-100 %] 100 % (11/30 0341) Blood pressure (!) 144/62, pulse 76, temperature 99 F (37.2 C), temperature source Oral, resp. rate 16, height 5' 5 (1.651 m), weight 67 kg, last menstrual period 10/11/2019, SpO2 100%. Body mass index is 24.58 kg/m.  Physical Exam  Mental Status Exam: General Appearance: Casual  Orientation:  Full (Time, Place, and Person)  Memory:  Immediate;   Good Recent;   Good Remote;   Good  Concentration:  Concentration: Good and Attention Span: Good  Recall:  Good  Attention  Fair  Eye Contact:  Poor  Speech:  Slow  Language:  Good  Volume:  Decreased  Mood: I feel depressed  Affect:  Constricted and Tearful  Thought Process:  Coherent and Linear  Thought Content:  Logical  Suicidal Thoughts:  No  Homicidal Thoughts:  No  Judgement:  Fair  Insight:  Fair  Psychomotor Activity:  Decreased  Akathisia:  No  Fund of Knowledge:  Good      Assets:  Communication Skills  Cognition:  WNL  ADL's:  Intact  AIMS (if indicated):        Other History   These have been pulled in through the EMR, reviewed, and updated if appropriate.  Family History:  The patient's family history includes Arthritis in her mother; Diabetes in her father and mother; Hypertension in her father and mother.  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  12/29/2021   Anxiety and depression    Atrial fibrillation (HCC)    Cerebral thrombosis with cerebral infarction 05/24/2021   Cocaine abuse (HCC)    Depression with suicidal ideation    [facility] admission 04/2018   Diabetes mellitus without complication (HCC)    Type II   End stage kidney disease (HCC)    GERD (gastroesophageal reflux disease)    Hyperlipidemia    Hypertension    Impingement syndrome of right shoulder    Migraine headache    Osteoarthritis of right knee 05/24/2021   Pilonidal abscess 05/2013   Pyelonephritis    Right thyroid nodule    Sciatica     Surgical History: Past Surgical History:  Procedure Laterality Date   AV FISTULA PLACEMENT Left 03/17/2023   Procedure: LEFT ARM BRACHIOCEPHALIC ARTERIOVENOUS (AV) FISTULA CREATION;  Surgeon: Lanis Fonda BRAVO, MD;  Location: [facility] OR;  Service: Vascular;  Laterality: Left;   BUBBLE STUDY  01/01/2022   Procedure: BUBBLE STUDY;  Surgeon: Alvan Ronal BRAVO, MD;  Location: [facility] ENDOSCOPY;  Service: Cardiovascular;;   IR FLUORO GUIDE CV LINE RIGHT  03/14/2023   IR US  GUIDE VASC ACCESS RIGHT  03/14/2023   TEE WITHOUT CARDIOVERSION N/A 01/01/2022   Procedure: TRANSESOPHAGEAL ECHOCARDIOGRAM (TEE);  Surgeon: Alvan Ronal BRAVO, MD;  Location: [facility] ENDOSCOPY;  Service: Cardiovascular;  Laterality: N/A;   tubal     TUBAL LIGATION       Medications:   Current Facility-Administered Medications:    acetaminophen  (TYLENOL ) tablet 650 mg, 650 mg, Oral, Q6H PRN, 650 mg at 08/08/24 0024 **OR** acetaminophen  (TYLENOL ) suppository 650 mg, 650 mg, Rectal, Q6H PRN, Opyd, Evalene RAMAN, MD   amLODipine  (NORVASC ) tablet 10 mg, 10 mg, Oral, Daily, Opyd, Timothy S, MD, 10 mg at 08/08/24 0844   apixaban  (ELIQUIS ) tablet 2.5 mg, 2.5 mg, Oral, BID, Opyd, Timothy S, MD, 2.5 mg at 08/08/24 0843   atorvastatin  (LIPITOR ) tablet 40 mg, 40 mg, Oral, Daily, Opyd, Timothy S, MD, 40 mg at 08/08/24 0843   carvedilol  (COREG ) tablet 6.25 mg, 6.25 mg,  Oral, BID WC, Opyd, Timothy S, MD, 6.25 mg at 08/08/24 0844   Chlorhexidine  Gluconate Cloth 2 % PADS 6 each, 6 each, Topical, Q0600, Collins, Samantha G, PA-C, 6 each at 08/07/24 0525   Chlorhexidine  Gluconate Cloth 2 % PADS 6 each, 6 each, Topical, Q0600, Collins, Samantha G, PA-C   Darbepoetin Alfa  (ARANESP ) injection 60 mcg, 60 mcg, Subcutaneous, Q Sat-1800, Coladonato, Fairy, MD, 60 mcg at 08/07/24 1801   hydrALAZINE  (APRESOLINE ) tablet 25 mg, 25 mg, Oral, BID, Opyd, Timothy S, MD, 25 mg at 08/08/24 0843   insulin  aspart (novoLOG ) injection 0-5 Units, 0-5 Units, Subcutaneous, QHS, Opyd, Timothy S, MD   insulin  aspart (novoLOG ) injection 0-6 Units, 0-6 Units, Subcutaneous, TID WC, Opyd, Timothy S, MD, 2 Units at 08/07/24 1643   isosorbide mononitrate (IMDUR) 24 hr tablet 30 mg, 30 mg, Oral, Daily, Singh, Prashant K, MD, 30 mg at 08/08/24 0843   pantoprazole  (PROTONIX ) EC tablet 40 mg, 40 mg, Oral, Daily, Coladonato, Fairy, MD, 40 mg at 08/08/24 0844   senna (SENOKOT) tablet 8.6 mg, 1 tablet, Oral, Daily PRN, Opyd, Evalene RAMAN, MD, 8.6 mg at 08/08/24 0857   sodium chloride  flush (NS) 0.9 % injection 3 mL, 3 mL, Intravenous, Q12H, Opyd, Evalene RAMAN, MD, 3 mL at 08/08/24 0846  Allergies: Allergies  Allergen Reactions   Neurontin  Artemisius.been ] Other (See Comments)    Weakness  Balance impairment     Jan DELENA Donath, MD

## 2024-08-08 NOTE — Progress Notes (Signed)
 PROGRESS NOTE        PATIENT DETAILS Name: Stacey Lucas Age: 48 y.o. Sex: female Date of Birth: Jul 10, 1976 Admit Date: 08/06/2024 Admitting Physician Evalene GORMAN Sprinkles, MD ERE:Wztopw, [name], MD  Brief Summary: Patient is a 48 y.o.  female with history of prior CVA with mild right-sided hemiparesis, ESRD on HD, HTN, DM-2-homelessness-who apparently moving to [facility] recently-has not had dialysis in several weeks-presented to the hospital with fatigue/malaise-found to have hyperkalemia.  Significant events: 11/28>> admit to [facility]  Significant studies: 11/28>> CXR: No PNA  Significant microbiology data: None  Procedures: None  Consults: Nephrology  Subjective:  Patient in bed, appears comfortable, denies any headache, no fever, no chest pain or pressure, no shortness of breath , no abdominal pain. No new focal weakness.   Objective: Vitals: Blood pressure (!) 150/69, pulse 76, temperature 98.7 F (37.1 C), temperature source Oral, resp. rate 16, height 5' 5 (1.651 m), weight 67 kg, last menstrual period 10/11/2019, SpO2 100%.   Exam:  Awake Alert, No new F.N deficits, Normal affect [place].AT,PERRAL Supple Neck, No JVD,   Symmetrical Chest wall movement, Good air movement bilaterally, CTAB RRR,No Gallops, Rubs or new Murmurs,  +ve B.Sounds, Abd Soft, No tenderness,   No Cyanosis, Clubbing or edema   Assessment/Plan:  Hyperkalemia Secondary to missed HD Treated with Lokelma  and then with HD Potassium is normalized.  ESRD on HD Missed several weeks of hemodialysis Unfortunately-patient does not have a dedicated outpatient HD center-she is homeless-and apparently comes to the ED quite a bit for dialysis. Underwent HD yesterday-plans are for HD again today. Will need TOC/diabetic coordinator evaluation prior to discharge.  Normocytic anemia Likely related to ESRD No evidence of blood loss. Aranesp /Iron  defer to nephrology  service.  HTN BP relatively stable Continue amlodipine /Coreg , add low-dose Imdur for better control  HLD Statin  PAF Telemetry monitoring Eliquis   History of CVA with mild right-sided hemiparesis/dysarthria Stable Apparently does use cane/walker Eliquis /statin. Await PT/OT eval.  Homelessness TOC team following  Suicidal ideation.  Expressed during HD session few days ago.  Psych called.  Sitter.  Currently no suicidal ideation.  Monitor.    DM-2 (A1c 5.6 on 10/28) CBG stable on SSI  Recent Labs    08/07/24 1607 08/07/24 2120 08/07/24 2149  GLUCAP 215* 116* 121*       Code status:   Code Status: Limited: Do not attempt resuscitation (DNR) -DNR-LIMITED -Do Not Intubate/DNI    DVT Prophylaxis: apixaban  (ELIQUIS ) tablet 2.5 mg Start: 08/06/24 1000 apixaban  (ELIQUIS ) tablet 2.5 mg    Family Communication: None at bedside   Disposition Plan: Status is: Inpatient Remains inpatient appropriate because: Severity of illness   Planned Discharge Destination:Home   Diet: Diet Order             Diet renal with fluid restriction Fluid restriction: 1200 mL Fluid; Room service appropriate? Yes; Fluid consistency: Thin  Diet effective now                     Antimicrobial agents: Anti-infectives (From admission, onward)    None        MEDICATIONS: Scheduled Meds:  amLODipine   10 mg Oral Daily   apixaban   2.5 mg Oral BID   atorvastatin   40 mg Oral Daily   carvedilol   6.25 mg Oral BID WC   Chlorhexidine  Gluconate Cloth  6 each Topical Q0600   darbepoetin (ARANESP ) injection - DIALYSIS  60 mcg Subcutaneous Q Sat-1800   hydrALAZINE   25 mg Oral BID   insulin  aspart  0-5 Units Subcutaneous QHS   insulin  aspart  0-6 Units Subcutaneous TID WC   pantoprazole   40 mg Oral Daily   sodium chloride  flush  3 mL Intravenous Q12H   Continuous Infusions: PRN Meds:.acetaminophen  **OR** acetaminophen , senna   I have personally reviewed following labs and  imaging studies  LABORATORY DATA: CBC: Recent Labs  Lab 08/06/24 0106 08/06/24 0434 08/07/24 0535 08/08/24 0338  WBC 10.0 9.6 9.9 8.9  HGB 9.6* 9.1* 8.3* 8.3*  HCT 29.2* 27.1* 24.8* 25.2*  MCV 80.7 78.8* 79.2* 79.7*  PLT 271 262 223 213    Basic Metabolic Panel: Recent Labs  Lab 08/06/24 0106 08/06/24 0434 08/07/24 0535 08/07/24 0732 08/08/24 0338  NA 140 142 137 140 135  K 5.9* 5.4* 4.5 4.8 5.0  CL 99 101 99 99 96*  CO2 19* 19* 28 23 27   GLUCOSE 180* 209* 188* 149* 141*  BUN 126* 127* 77* 78* 45*  CREATININE 16.38* 16.26* 10.98* 11.05* 7.42*  CALCIUM  6.8* 6.6* 6.7* 6.7* 7.1*  PHOS  --  8.4*  --  5.4* 4.4    GFR: Estimated Creatinine Clearance: 8.3 mL/min (A) (by C-G formula based on SCr of 7.42 mg/dL (H)).  Liver Function Tests: Recent Labs  Lab 08/07/24 0732 08/08/24 0338  ALBUMIN  2.6* 2.5*   No results for input(s): LIPASE, AMYLASE in the last 168 hours. No results for input(s): AMMONIA in the last 168 hours.  Coagulation Profile: No results for input(s): INR, PROTIME in the last 168 hours.  Cardiac Enzymes: No results for input(s): CKTOTAL, CKMB, CKMBINDEX, TROPONINI in the last 168 hours.  BNP (last 3 results) No results for input(s): PROBNP in the last 8760 hours.  Lipid Profile: No results for input(s): CHOL, HDL, LDLCALC, TRIG, CHOLHDL, LDLDIRECT in the last 72 hours.  Thyroid Function Tests: No results for input(s): TSH, T4TOTAL, FREET4, T3FREE, THYROIDAB in the last 72 hours.  Anemia Panel: Recent Labs    08/06/24 0434  VITAMINB12 633  FOLATE 13.4  FERRITIN 773*  TIBC 200*  IRON  126  RETICCTPCT 0.6    Urine analysis:    Component Value Date/Time   COLORURINE AMBER (A) 11/14/2022 1902   APPEARANCEUR CLOUDY (A) 11/14/2022 1902   LABSPEC 1.018 11/14/2022 1902   PHURINE 7.0 11/14/2022 1902   GLUCOSEU NEGATIVE 11/14/2022 1902   HGBUR NEGATIVE 11/14/2022 1902   BILIRUBINUR NEGATIVE  11/14/2022 1902   KETONESUR 5 (A) 11/14/2022 1902   PROTEINUR >=300 (A) 11/14/2022 1902   NITRITE NEGATIVE 11/14/2022 1902   LEUKOCYTESUR MODERATE (A) 11/14/2022 1902    Sepsis Labs: Lactic Acid, Venous    Component Value Date/Time   LATICACIDVEN 0.52 12/29/2016 1522    MICROBIOLOGY: Recent Results (from the past 240 hours)  MRSA Next Gen by PCR, Nasal     Status: None   Collection Time: 08/06/24  4:47 AM   Specimen: Nasal Mucosa; Nasal Swab  Result Value Ref Range Status   MRSA by PCR Next Gen NOT DETECTED NOT DETECTED Final    Comment: (NOTE) The GeneXpert MRSA Assay (FDA approved for NASAL specimens only), is one component of a comprehensive MRSA colonization surveillance program. It is not intended to diagnose MRSA infection nor to guide or monitor treatment for MRSA infections. Test performance is not FDA approved in patients less than 43 years old. Performed  at [facility], [address] [place], [place] [address]     RADIOLOGY STUDIES/RESULTS: No results found.    LOS: 2 days   Lavada Stank, MD  [facility]    To contact the attending provider between 7A-7P or the covering provider during after hours 7P-7A, please log into the web site [email or web address] and access using universal [place] password for that web site. If you do not have the password, please call the hospital operator.  08/08/2024, 8:05 AM

## 2024-08-08 NOTE — Progress Notes (Signed)
 [facility] Progress Note   Subjective:   Reports she has had a headache throughout admission. Denies SOB, CP, palpitations, dizziness, nausea.   Objective Vitals:   08/08/24 0339 08/08/24 0341 08/08/24 0500 08/08/24 0813  BP: (!) 150/69   (!) 144/62  Pulse: 77 76    Resp: 16     Temp:   98.7 F (37.1 C) 99 F (37.2 C)  TempSrc:   Oral Oral  SpO2: 100% 100%    Weight:      Height:       Physical Exam General: alert female in NAD Heart: RRR, no murmurs Lungs: CTA bilaterally, respirations unlabored on RA Abdomen: Soft, non-distended, +BS Extremities: No edema b/l lower extremities Dialysis Access: AVF + t/b   Additional Objective Labs: Basic Metabolic Panel: Recent Labs  Lab 08/06/24 0434 08/07/24 0535 08/07/24 0732 08/08/24 0338  NA 142 137 140 135  K 5.4* 4.5 4.8 5.0  CL 101 99 99 96*  CO2 19* 28 23 27   GLUCOSE 209* 188* 149* 141*  BUN 127* 77* 78* 45*  CREATININE 16.26* 10.98* 11.05* 7.42*  CALCIUM  6.6* 6.7* 6.7* 7.1*  PHOS 8.4*  --  5.4* 4.4   Liver Function Tests: Recent Labs  Lab 08/07/24 0732 08/08/24 0338  ALBUMIN  2.6* 2.5*   No results for input(s): LIPASE, AMYLASE in the last 168 hours. CBC: Recent Labs  Lab 08/06/24 0106 08/06/24 0434 08/07/24 0535 08/08/24 0338  WBC 10.0 9.6 9.9 8.9  HGB 9.6* 9.1* 8.3* 8.3*  HCT 29.2* 27.1* 24.8* 25.2*  MCV 80.7 78.8* 79.2* 79.7*  PLT 271 262 223 213   Blood Culture    Component Value Date/Time   SDES URINE, RANDOM 11/14/2022 1902   SPECREQUEST  11/14/2022 1902    NONE Reflexed from [id number] Performed at [facility], [address], [place], [place] [address]    CULT >=100,000 COLONIES/mL ESCHERICHIA COLI (A) 11/14/2022 1902   REPTSTATUS 11/17/2022 FINAL 11/14/2022 1902    Cardiac Enzymes: No results for input(s): CKTOTAL, CKMB, CKMBINDEX, TROPONINI in the last 168 hours. CBG: Recent Labs  Lab 08/07/24 1228 08/07/24 1607 08/07/24 2120 08/07/24 2149  08/08/24 0815  GLUCAP 216* 215* 116* 121* 149*   Iron  Studies:  Recent Labs    08/06/24 0434  IRON  126  TIBC 200*  FERRITIN 773*   @lablastinr3 @ Studies/Results: No results found. Medications:   amLODipine   10 mg Oral Daily   apixaban   2.5 mg Oral BID   atorvastatin   40 mg Oral Daily   carvedilol   6.25 mg Oral BID WC   Chlorhexidine  Gluconate Cloth  6 each Topical Q0600   darbepoetin (ARANESP ) injection - DIALYSIS  60 mcg Subcutaneous Q Sat-1800   hydrALAZINE   25 mg Oral BID   insulin  aspart  0-5 Units Subcutaneous QHS   insulin  aspart  0-6 Units Subcutaneous TID WC   isosorbide mononitrate  30 mg Oral Daily   pantoprazole   40 mg Oral Daily   sodium chloride  flush  3 mL Intravenous Q12H    Dialysis Orders:  previously [place], no HD unit now \   Assessment/Plan:  Hyperkalemia: Improved with lokelma  and HD, continue low K diet.   ESRD:  No dialysis for 2 weeks. Had short HD Friday and Saturday. Will plan for next HD Monday, not sure what her outpatient HD schedule will be yet.  Will need an outpatient HD unit. Renal navigators are off today, will initiate CLIP process on Monday.   Hypertension/volume: BP  elevated, home meds resumed  Anemia: Hgb 8.3. Started aranesp .   Metabolic bone disease: Calcium  low, PTH . Phos now at goal, continue binders.   Nutrition:  Will need renal diet and fluid restrictions.     Lucie Collet, PA-C 08/08/2024, 8:56 AM  [facility] Pager: [phone]

## 2024-08-08 NOTE — Plan of Care (Signed)
 Pt placed on SI precaution as patient scored at moderate risk yesterday. Sitter at bedside. Pt stated she felt that way when she had overwhelming feeling of hopelessness. When asked, pt denied any active plan in place for self harm.    Problem: Education: Goal: Ability to describe self-care measures that may prevent or decrease complications (Diabetes Survival Skills Education) will improve Outcome: Progressing Goal: Individualized Educational Video(s) Outcome: Progressing   Problem: Coping: Goal: Ability to adjust to condition or change in health will improve Outcome: Progressing   Problem: Fluid Volume: Goal: Ability to maintain a balanced intake and output will improve Outcome: Progressing   Problem: Health Behavior/Discharge Planning: Goal: Ability to identify and utilize available resources and services will improve Outcome: Progressing Goal: Ability to manage health-related needs will improve Outcome: Progressing   Problem: Metabolic: Goal: Ability to maintain appropriate glucose levels will improve Outcome: Progressing   Problem: Nutritional: Goal: Maintenance of adequate nutrition will improve Outcome: Progressing Goal: Progress toward achieving an optimal weight will improve Outcome: Progressing   Problem: Skin Integrity: Goal: Risk for impaired skin integrity will decrease Outcome: Progressing   Problem: Tissue Perfusion: Goal: Adequacy of tissue perfusion will improve Outcome: Progressing   Problem: Education: Goal: Knowledge of General Education information will improve Description: Including pain rating scale, medication(s)/side effects and non-pharmacologic comfort measures Outcome: Progressing   Problem: Health Behavior/Discharge Planning: Goal: Ability to manage health-related needs will improve Outcome: Progressing   Problem: Clinical Measurements: Goal: Ability to maintain clinical measurements within normal limits will improve Outcome:  Progressing Goal: Will remain free from infection Outcome: Progressing Goal: Diagnostic test results will improve Outcome: Progressing Goal: Respiratory complications will improve Outcome: Progressing Goal: Cardiovascular complication will be avoided Outcome: Progressing   Problem: Activity: Goal: Risk for activity intolerance will decrease Outcome: Progressing   Problem: Nutrition: Goal: Adequate nutrition will be maintained Outcome: Progressing   Problem: Coping: Goal: Level of anxiety will decrease Outcome: Progressing   Problem: Elimination: Goal: Will not experience complications related to bowel motility Outcome: Progressing Goal: Will not experience complications related to urinary retention Outcome: Progressing   Problem: Pain Managment: Goal: General experience of comfort will improve and/or be controlled Outcome: Progressing   Problem: Safety: Goal: Ability to remain free from injury will improve Outcome: Progressing   Problem: Skin Integrity: Goal: Risk for impaired skin integrity will decrease Outcome: Progressing

## 2024-08-09 DIAGNOSIS — Z992 Dependence on renal dialysis: Secondary | ICD-10-CM | POA: Diagnosis not present

## 2024-08-09 DIAGNOSIS — N186 End stage renal disease: Secondary | ICD-10-CM | POA: Diagnosis not present

## 2024-08-09 LAB — GLUCOSE, CAPILLARY
Glucose-Capillary: 201 mg/dL — ABNORMAL HIGH (ref 70–99)
Glucose-Capillary: 239 mg/dL — ABNORMAL HIGH (ref 70–99)
Glucose-Capillary: 282 mg/dL — ABNORMAL HIGH (ref 70–99)

## 2024-08-09 LAB — RENAL FUNCTION PANEL
Albumin: 2.7 g/dL — ABNORMAL LOW (ref 3.5–5.0)
Anion gap: 13 (ref 5–15)
BUN: 62 mg/dL — ABNORMAL HIGH (ref 6–20)
CO2: 27 mmol/L (ref 22–32)
Calcium: 7.5 mg/dL — ABNORMAL LOW (ref 8.9–10.3)
Chloride: 98 mmol/L (ref 98–111)
Creatinine, Ser: 9.05 mg/dL — ABNORMAL HIGH (ref 0.44–1.00)
GFR, Estimated: 5 mL/min — ABNORMAL LOW (ref >60)
Glucose, Bld: 146 mg/dL — ABNORMAL HIGH (ref 70–99)
Phosphorus: 4.7 mg/dL — ABNORMAL HIGH (ref 2.5–4.6)
Potassium: 5.9 mmol/L — ABNORMAL HIGH (ref 3.5–5.1)
Sodium: 138 mmol/L (ref 135–145)

## 2024-08-09 LAB — PARATHYROID HORMONE, INTACT (NO CA): PTH: 192 pg/mL — ABNORMAL HIGH (ref 15–65)

## 2024-08-09 LAB — CBC
HCT: 24.6 % — ABNORMAL LOW (ref 36.0–46.0)
Hemoglobin: 7.9 g/dL — ABNORMAL LOW (ref 12.0–15.0)
MCH: 26.1 pg (ref 26.0–34.0)
MCHC: 32.1 g/dL (ref 30.0–36.0)
MCV: 81.2 fL (ref 80.0–100.0)
Platelets: 220 K/uL (ref 150–400)
RBC: 3.03 MIL/uL — ABNORMAL LOW (ref 3.87–5.11)
RDW: 16.8 % — ABNORMAL HIGH (ref 11.5–15.5)
WBC: 9.3 K/uL (ref 4.0–10.5)
nRBC: 0 % (ref 0.0–0.2)

## 2024-08-09 MED ORDER — TRAZODONE HCL 50 MG PO TABS
50.0000 mg | ORAL_TABLET | Freq: Every day | ORAL | Status: DC
  Administered 2024-08-09 – 2024-08-13 (×5): 50 mg via ORAL
  Filled 2024-08-09 (×5): qty 1

## 2024-08-09 MED ORDER — ACETAMINOPHEN 325 MG PO TABS
ORAL_TABLET | ORAL | Status: AC
  Filled 2024-08-09: qty 2

## 2024-08-09 NOTE — Evaluation (Addendum)
 Occupational Therapy Evaluation Patient Details Name: Stacey Lucas MRN: [id number] DOB: 1975/11/09 Today's Date: 08/09/2024   History of Present Illness   48 yo female presenting to ED requesting HD (missing HD for 2 weeks) pt workup reveals hyperkalemia.  PMH CVA L pons, L basal ganglia, anxiety depression suicidal ideations, DM, HLD HTN migraines cocaine abuse, ESRD on HD, PAF on eliquis ,     Clinical Impressions PTA, pt recently staying with a friend but experiencing housing insecurity. Pt reports overall Modified Independent with ADLs from a wheelchair level and able to transfer self without assist. Pt presents now with deficits in strength and endurance. Pt reports feeling weaker post HD so politely deferred standing when assessing ADLs. Overall, Pt requires Supervision for bed mobility, Setup for UB ADL and Min A for LB ADLs. Pt will benefit from continued OT services in a postacute rehab setting with therapy < 3 hours per day.      If plan is discharge home, recommend the following:   A little help with walking and/or transfers;A little help with bathing/dressing/bathroom;Assistance with cooking/housework     Functional Status Assessment   Patient has had a recent decline in their functional status and demonstrates the ability to make significant improvements in function in a reasonable and predictable amount of time.     Equipment Recommendations   None recommended by OT     Recommendations for Other Services         Precautions/Restrictions   Precautions Precautions: Fall Recall of Precautions/Restrictions: Intact Restrictions Weight Bearing Restrictions Per Provider Order: No     Mobility Bed Mobility Overal bed mobility: Needs Assistance Bed Mobility: Supine to Sit, Sit to Supine, Rolling Rolling: Modified independent (Device/Increase time)   Supine to sit: Supervision, HOB elevated, Used rails Sit to supine: Supervision        Transfers                    General transfer comment: pt deferred due to feeling weak post HD      Balance Overall balance assessment: Needs assistance, Mild deficits observed, not formally tested Sitting-balance support: No upper extremity supported, Feet supported Sitting balance-Leahy Scale: Good                                     ADL either performed or assessed with clinical judgement   ADL Overall ADL's : Needs assistance/impaired Eating/Feeding: Independent;Sitting   Grooming: Set up;Sitting;Wash/dry face;Applying deodorant   Upper Body Bathing: Set up;Sitting Upper Body Bathing Details (indicate cue type and reason): pt also able to apply lotion to UB with Setup Lower Body Bathing: Minimal assistance;Sitting/lateral leans;Bed level Lower Body Bathing Details (indicate cue type and reason): Min A for throughness of posterior region and B feet. able to wash BLE and anterior peri region without assist bed level. declined to attempt standing for these tasks due to feeling weak Upper Body Dressing : Set up;Sitting   Lower Body Dressing: Minimal assistance;Sitting/lateral leans;Bed level                       Vision Ability to See in Adequate Light: 0 Adequate Patient Visual Report: No change from baseline Vision Assessment?: No apparent visual deficits     Perception         Praxis         Pertinent Vitals/Pain Pain Assessment Pain Assessment: No/denies pain  Extremity/Trunk Assessment Upper Extremity Assessment Upper Extremity Assessment: Right hand dominant;RUE deficits/detail RUE Deficits / Details: hx of R sided weakness but this UE is overall functional   Lower Extremity Assessment Lower Extremity Assessment: Defer to PT evaluation   Cervical / Trunk Assessment Cervical / Trunk Assessment: Normal   Communication Communication Communication: No apparent difficulties   Cognition Arousal: Alert Behavior During Therapy: WFL for  tasks assessed/performed, Flat affect Cognition: No apparent impairments                               Following commands: Intact       Cueing  General Comments   Cueing Techniques: Verbal cues      Exercises     Shoulder Instructions      Home Living Family/patient expects to be discharged to:: Unsure                                 Additional Comments: Pt reported she may be able to stay with a friend who could help with bathing/dressing. If staying with friend, will have 2 steps to enter with no handrails. Has manual WC. pt reports a recent separation from her spouse      Prior Functioning/Environment Prior Level of Function : Needs assist             Mobility Comments: Performed stand-pivots to manual WC at baseline ADLs Comments: typically Mod I for ADLs but did have occasional assist for LB ADLs- reports spouse had assisted with this in the past  but does not have that assist now that they are separated    OT Problem List: Decreased strength;Impaired balance (sitting and/or standing);Decreased activity tolerance   OT Treatment/Interventions: Self-care/ADL training;Therapeutic exercise;Energy conservation;DME and/or AE instruction;Therapeutic activities;Patient/family education;Balance training      OT Goals(Current goals can be found in the care plan section)   Acute Rehab OT Goals Patient Stated Goal: go to rehab to get stronger, would like to be able to walk again OT Goal Formulation: With patient Time For Goal Achievement: 08/23/24 Potential to Achieve Goals: Good ADL Goals Pt Will Perform Lower Body Bathing: with modified independence;sitting/lateral leans;sit to/from stand Pt Will Perform Lower Body Dressing: with modified independence;sit to/from stand;sitting/lateral leans Pt Will Transfer to Toilet: with modified independence;stand pivot transfer;bedside commode Pt Will Perform Toileting - Clothing Manipulation and  hygiene: with modified independence;sitting/lateral leans;sit to/from stand Pt/caregiver will Perform Home Exercise Program: Increased strength;Both right and left upper extremity;With theraband;Independently;With written HEP provided   OT Frequency:  Min 2X/week    Co-evaluation              AM-PAC OT 6 Clicks Daily Activity     Outcome Measure Help from another person eating meals?: None Help from another person taking care of personal grooming?: A Little Help from another person toileting, which includes using toliet, bedpan, or urinal?: A Little Help from another person bathing (including washing, rinsing, drying)?: A Little Help from another person to put on and taking off regular upper body clothing?: A Little Help from another person to put on and taking off regular lower body clothing?: A Little 6 Click Score: 19   End of Session Nurse Communication: Mobility status  Activity Tolerance: Patient tolerated treatment well Patient left: in bed;with call bell/phone within reach;with bed alarm set  OT Visit Diagnosis: Unsteadiness on feet (R26.81);Other abnormalities  of gait and mobility (R26.89);Muscle weakness (generalized) (M62.81)                Time: 8660-8592 OT Time Calculation (min): 28 min Charges:  OT General Charges $OT Visit: 1 Visit OT Evaluation $OT Eval Low Complexity: 1 Low OT Treatments $Self Care/Home Management : 8-22 mins  Mliss NOVAK, OTR/L Acute Rehab Services Office: [phone]   Mliss Fish 08/09/2024, 2:28 PM

## 2024-08-09 NOTE — Consult Note (Signed)
 [facility] Psychiatric Consult Initial  Patient Name: Stacey Lucas  MRN: [id number]  DOB: 14-Dec-1975  Consult Order details:  Orders (From admission, onward)     Start     Ordered   08/07/24 1427  IP CONSULT TO PSYCHIATRY       Ordering Provider: Raenelle Donalda HERO, MD  Provider:  (Not yet assigned)  Question Answer Comment  Location [facility]   Reason for Consult? Depression      08/07/24 1427            Mode of Visit: In person   Psychiatry Consult Evaluation  Service Date: August 09, 2024 LOS:  LOS: 3 days  Chief Complaint: I have been feeling more depressed and nervous since I found out my husband of 10 years was cheating on me.  Primary Psychiatric Diagnoses  Major depressive disorder, recurrent  2.  Cocaine use disorder and cocaine-induced mood disorder   Assessment  Stacey Lucas is a 48 y.o. female admitted: Medically on 08/06/2024 12:39 AM for fatigue/malaise after missing her dialysis for 2 weeks. She carries the psychiatric diagnoses of Cocaine use disorder and cocaine induced mood disorder and has a past medical history prior CVA with mild right-sided hemiparesis, ESRD on HD, HTN, and DM-2. Psychiatry consulted for evaluation of depression. Her current presentation of low mood, fatigue, anhedonia, insomnia, crying spells, and nervousness in the context of psychosocial stressors including current homelessness and infidelity from husband are most consistent with Major Depressive Disorder. A diagnosis of cocaine use disorder and cocaine-induced mood disorder is also a consideration due to her history of chronic cocaine abuse and previous mood symptoms.   12/1: Patient presents with depressed mood, psychomotor slowing, and dysthymic affect. She denies suicidal ideation, but states she feels like God doesn't have a purpose for me. She is understandably struggling to process her husband's infidelity and her current lack of housing. States she does  not know where she will live following discharge. She is tolerating Zoloft  well, with no endorsement of side effects. Will continue current dosage for now, with plan for titration as able. She reports ongoing insomnia, will start low-dose trazodone  for sleep. QTc stable.   Plan   ## Psychiatric Medication Recommendations:  --Continue Zoloft  50 mg daily for depression/anxiety. This is considered relatively safe in this patient with prolonged Qtc (551) and CKD. Please monitor Qtc daily.  --Continue melatonin 5 mg at bedtime for sleep --Add trazodone  50 mg at bedtime for sleep --Continue medical treatment as the primary team  ## Medical Decision Making Capacity: Not specifically addressed in this encounter  ## Further Work-up:  -- Per primary team -- TSH, B12, Folate -- EKG on 08/06/24 had QtC of 551. Please monitor QTC daily.  -- Pertinent labwork reviewed earlier this admission includes: BUN-45, CR-7.4, HB-8.3, GFR-6  ## Disposition:-- There are no psychiatric contraindications to discharge at this time  ## Behavioral / Environmental: - No specific recommendations at this time.     ## Safety and Observation Level:  - Based on my clinical evaluation, I estimate the patient to be at low risk of self harm in the current setting. - At this time, we recommend routine monitoring. This decision is based on my review of the chart including patient's history and current presentation, interview of the patient, mental status examination, and consideration of suicide risk including evaluating suicidal ideation, plan, intent, suicidal or self-harm behaviors, risk factors, and protective factors. This judgment is based on our ability to directly  address suicide risk, implement suicide prevention strategies, and develop a safety plan while the patient is in the clinical setting. Please contact our team if there is a concern that risk level has changed.  CSSR Risk Category:C-SSRS RISK CATEGORY: Moderate  Risk  Suicide Risk Assessment: Patient has following modifiable risk factors for suicide: untreated depression, recklessness, and medication noncompliance, which we are addressing by prescribing medications. Patient has following non-modifiable or demographic risk factors for suicide: separation or divorce Patient has the following protective factors against suicide: Supportive family and Cultural, spiritual, or religious beliefs that discourage suicide  Thank you for this consult request. Recommendations have been communicated to the primary team. We will follow at this time.   Ashley LOISE Gravely, MD       History of Present Illness  Relevant Aspects of [facility] Course:  Admitted on 08/06/2024 after missing her dialysis for 2 weeks.    Initial Patient Report:  Patient seen face to face in her hospital room. She is awake, alert and oriented to time, place, person and situation. When asked about the reason for this admission, patient states she missed her dialysis for two weeks and became weak and fatigued, necessitating her presentation to the hospital. On further enquiry, patient states she's been feeling overwhelmed, depressed, worry, apprehensive, nervous and has difficulty sleeping for over 2-3 months because her husband of 10 years was cheating on her and brought the lady into their home. As a result, patient states she had to move out of the house and currently lives with a friend. Patient reports difficulty sleeping, low energy level, and lack of motivation, but denies suicidal or homicidal ideation, intent or plan. She denies previous history of suicide attempts or self-injurious behavior. Patient states she would like to be alive for her 76 adult children aged 67, 33 and 40 years whom she often communicates with.    Patient denies auditory or visual hallucinations and denies delusions. She denies previous history of physical or sexual abuse, and denies nightmares or intrusive  thoughts. When asked about drug use, patient states she's been using cocaine and smoke cigarette for years saying using cocaine is the only way I get through my day. This writer advised patient to stay away from drug use considering its adverse effects on her medical and mental health.    Review of Systems  Psychiatric/Behavioral:  Positive for depression and substance abuse. Negative for hallucinations and suicidal ideas. The patient is nervous/anxious and has insomnia.      Psychiatric and Social History  Psychiatric History:  Information collected from the patient  Prev Dx/Sx: Cocaine use disorder and cocaine induced mood disorder Current Psych Provider: none Home Meds (current): Bupropion  XL 150 mg but does not use.  Previous Med Trials: Duloxetine  30 mg daily Therapy: none  Prior Psych Hospitalization: patient denies  Prior Self Harm: Patient denies Prior Violence: Patient denies  Family Psych History: denies Family Hx suicide: denies  Social History:  Educational Hx: dropped out of high school Occupational Hx: Currently unemployed, on disability benefit. Used to work as advertising copywriter at [employer] Hx: patient denies Living Situation: Currently homeless-Lives with her friend after moving out of her husband home for cheating on her. Spiritual Hx: unsure Access to weapons/lethal means: patient denies    Substance History Alcohol: denies  Type of alcohol: N/A Last Drink: denies Number of drinks per day: denies History of alcohol withdrawal seizures: N/A History of DT's: N/A Tobacco: Yes, smokes half pack of cigarette  daily since age 60 years Illicit drugs: Yes, cocaine Prescription drug abuse: denies Rehab hx: denies  Exam Findings   Physical Exam:  Vital Signs:  Temp:  [98 F (36.7 C)-99.1 F (37.3 C)] 98 F (36.7 C) (12/01 1150) Pulse Rate:  [77-90] 84 (12/01 1150) Resp:  [15-19] 15 (12/01 1150) BP: (134-160)/(52-72) 142/64 (12/01 1150) SpO2:  [96  %-100 %] 100 % (12/01 1110) Weight:  [66.9 kg] 66.9 kg (12/01 0718) Blood pressure (!) 142/64, pulse 84, temperature 98 F (36.7 C), temperature source Oral, resp. rate 15, height 5' 5 (1.651 m), weight 66.9 kg, last menstrual period 10/11/2019, SpO2 100%. Body mass index is 24.54 kg/m.  Physical Exam Vitals and nursing note reviewed.  Constitutional:      General: She is not in acute distress.    Appearance: Normal appearance. She is normal weight. She is not ill-appearing.  HENT:     Head: Normocephalic and atraumatic.  Pulmonary:     Effort: Pulmonary effort is normal. No respiratory distress.  Neurological:     General: No focal deficit present.     Mental Status: She is alert and oriented to person, place, and time. Mental status is at baseline.    Mental Status Exam: General Appearance: Casual  Orientation:  Full (Time, Place, and Person)  Memory:  Immediate;   Good Recent;   Good Remote;   Good  Concentration:  Concentration: Good and Attention Span: Good  Recall:  Good  Attention  Fair  Eye Contact:  Poor  Speech:  Slow  Language:  Good  Volume:  Decreased  Mood: I'm here  Affect:  Constricted and Tearful  Thought Process:  Coherent and Linear  Thought Content:  Logical  Suicidal Thoughts:  No  Homicidal Thoughts:  No  Judgement:  Fair  Insight:  Fair  Psychomotor Activity:  Decreased  Akathisia:  No  Fund of Knowledge:  Good   Assets:  Communication Skills  Cognition:  WNL  ADL's:  Intact  AIMS (if indicated):  N/A   Other History   These have been pulled in through the EMR, reviewed, and updated if appropriate.   Family History:  The patient's family history includes Arthritis in her mother; Diabetes in her father and mother; Hypertension in her father and mother.  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  12/29/2021    Anxiety and depression    Atrial fibrillation (HCC)    Cerebral thrombosis with cerebral infarction 05/24/2021   Cocaine abuse (HCC)    Depression with suicidal ideation    [facility] admission 04/2018   Diabetes mellitus without complication (HCC)    Type II   End stage kidney disease (HCC)    GERD (gastroesophageal reflux disease)    Hyperlipidemia    Hypertension    Impingement syndrome of right shoulder    Migraine headache    Osteoarthritis of right knee 05/24/2021   Pilonidal abscess 05/2013   Pyelonephritis    Right thyroid nodule    Sciatica    Surgical History: Past Surgical History:  Procedure Laterality Date   AV FISTULA PLACEMENT Left 03/17/2023   Procedure: LEFT ARM BRACHIOCEPHALIC ARTERIOVENOUS (AV) FISTULA CREATION;  Surgeon: Lanis Fonda BRAVO, MD;  Location: [facility] OR;  Service: Vascular;  Laterality: Left;   BUBBLE STUDY  01/01/2022   Procedure: BUBBLE STUDY;  Surgeon: Alvan Ronal BRAVO, MD;  Location: [facility] ENDOSCOPY;  Service:  Cardiovascular;;   IR FLUORO GUIDE CV LINE RIGHT  03/14/2023   IR US  GUIDE VASC ACCESS RIGHT  03/14/2023   TEE WITHOUT CARDIOVERSION N/A 01/01/2022   Procedure: TRANSESOPHAGEAL ECHOCARDIOGRAM (TEE);  Surgeon: Alvan Ronal BRAVO, MD;  Location: [facility] ENDOSCOPY;  Service: Cardiovascular;  Laterality: N/A;   tubal     TUBAL LIGATION     Medications:   Current Facility-Administered Medications:    acetaminophen  (TYLENOL ) tablet 650 mg, 650 mg, Oral, Q6H PRN, 650 mg at 08/09/24 0807 **OR** acetaminophen  (TYLENOL ) suppository 650 mg, 650 mg, Rectal, Q6H PRN, Opyd, Evalene RAMAN, MD   amLODipine  (NORVASC ) tablet 10 mg, 10 mg, Oral, Daily, Opyd, Timothy S, MD, 10 mg at 08/08/24 0844   apixaban  (ELIQUIS ) tablet 2.5 mg, 2.5 mg, Oral, BID, Opyd, Timothy S, MD, 2.5 mg at 08/08/24 2119   atorvastatin  (LIPITOR ) tablet 40 mg, 40 mg, Oral, Daily, Opyd, Timothy S, MD, 40 mg at 08/08/24 0843   carvedilol  (COREG ) tablet 6.25 mg, 6.25 mg, Oral, BID WC, Opyd, Timothy S, MD, 6.25 mg  at 08/08/24 1618   Chlorhexidine  Gluconate Cloth 2 % PADS 6 each, 6 each, Topical, Q0600, Collins, Samantha G, PA-C, 6 each at 08/07/24 0525   Chlorhexidine  Gluconate Cloth 2 % PADS 6 each, 6 each, Topical, Q0600, Collins, Samantha G, PA-C, 6 each at 08/09/24 0603   Darbepoetin Alfa  (ARANESP ) injection 60 mcg, 60 mcg, Subcutaneous, Q Sat-1800, Coladonato, Fairy, MD, 60 mcg at 08/07/24 1801   hydrALAZINE  (APRESOLINE ) tablet 25 mg, 25 mg, Oral, BID, Opyd, Timothy S, MD, 25 mg at 08/08/24 2119   insulin  aspart (novoLOG ) injection 0-5 Units, 0-5 Units, Subcutaneous, QHS, Opyd, Timothy S, MD, 2 Units at 08/08/24 2119   insulin  aspart (novoLOG ) injection 0-6 Units, 0-6 Units, Subcutaneous, TID WC, Opyd, Timothy S, MD, 5 Units at 08/08/24 1618   isosorbide mononitrate (IMDUR) 24 hr tablet 30 mg, 30 mg, Oral, Daily, Singh, Prashant K, MD, 30 mg at 08/08/24 0843   melatonin tablet 5 mg, 5 mg, Oral, QHS, Akintayo, Musa A, MD, 5 mg at 08/08/24 2119   pantoprazole  (PROTONIX ) EC tablet 40 mg, 40 mg, Oral, Daily, Coladonato, Fairy, MD, 40 mg at 08/08/24 0844   senna (SENOKOT) tablet 8.6 mg, 1 tablet, Oral, Daily PRN, Opyd, Evalene RAMAN, MD, 8.6 mg at 08/08/24 0857   sertraline  (ZOLOFT ) tablet 50 mg, 50 mg, Oral, Daily, Akintayo, Musa A, MD, 50 mg at 08/08/24 1214   sodium chloride  flush (NS) 0.9 % injection 3 mL, 3 mL, Intravenous, Q12H, Opyd, Evalene RAMAN, MD, 3 mL at 08/08/24 2121  Allergies: Allergies  Allergen Reactions   Neurontin  [Gabapentin ] Other (See Comments)    Weakness  Balance impairment     Ashley LOISE Gravely, MD PGY-1

## 2024-08-09 NOTE — Progress Notes (Signed)
  Received patient in bed to unit.   Informed consent signed and in chart.    TX duration:3.5     Transported by  Hand-off given to patient's nurse.    Access used: RT AVF Access issues: None   Total UF removed: 2000 Medication(s) given: Tylenol  Post HD VS: 157/62 Post HD weight: 68.3 kg     Hunter Hacking  LPN [facility] D[facility] Unit

## 2024-08-09 NOTE — Progress Notes (Signed)
 Requested to see pt for out-pt HD needs at d/c. Pt's chart was reviewed. Pt has informed hospital staff that she had been staying with family in [place] and then came to [place]. Contacted [facility] and spoke to staff. Pt is still currently considered a pt at [facility] on MWF 12:00 chair time but pt has not been treated there in a few weeks due to coming to [place]. [facility] reports that they were unable to reach pt to attempt to transfer pt to a clinic here in [place]. Clinic staff advised pt is currently hospitalized and will  likely need to transfer to another clinic at d/c based on pt's d/c plan. Contacted CSW to be made aware of the above info and to inquire about pt's d/c plan. Pt may be for possible snf placement at d/c. Out-pt HD clinic will need to be arranged based on pt's d/c plan. Will follow and assist with HD clinic placement once d/c plan is confirmed.   Randine Mungo Dialysis Navigator [phone]

## 2024-08-09 NOTE — Progress Notes (Signed)
   08/09/24 1231  Spiritual Framework  Presenting Themes Impactful experiences and emotions  Patient Stress Factors Health changes;Exhausted  Family Stress Factors None identified  Interventions  Spiritual Care Interventions Made Compassionate presence;Established relationship of care and support;Normalization of emotions;Provided grief education  Intervention Outcomes  Outcomes Awareness of support;Awareness of health   Chaplain responded to Pt room for supportive visit. Pt shared significant emotional distress related to recent relational lost of ten years, including feeling of  heartbreak, betrayal and overwhelm.  Chaplain provided a calm, supportive presence, active listening, and validated Pt's emotional experience. Chaplain offered a space for Pt to process her grief. Chaplain services remain available for continued emotional ans spiritual support as needed.

## 2024-08-09 NOTE — TOC Progression Note (Addendum)
 Transition of Care [facility]) - Progression Note    Patient Details  Name: Stacey Lucas MRN: [id number] Date of Birth: 08/25/1976  Transition [facility]) CM/SW Contact  Bernardino Dean, [place] Phone Number: 08/09/2024, 3:37 PM  Clinical Narrative:    CSW met with patient at bedside to discuss PT/OT recs of SNF. Patient was unaware of recs and was unfamiliar with SNF level of care, education provided on SNF process. Patient shares she has residual R sided deficits from 3 previous strokes and is normally able to furniture surf at baseline, but has relied on her wheelchair for a few weeks prior to and since her current hospitalization. She believes she is not at her functional baseline and may benefit from short course of PT/OT at SNF.  We discussed the significant barriers to achieving SNF, including: insurance, housing situation, and dialysis arrangements. She verbalized understanding. Stated she will continue to participate with PT/OT. She shared no SNF location preferences and we agreed to start with local facilities in/around [place] with the understanding that the search may need expanded. Will create and send referrals. TOC following for SNF placement.   4:15: PASRR submitted, flagged for review. Must ID: [id number]; USP ID: [id number]. Await review prior to sending referrals. FL2 shell created.   Expected Discharge Plan:  (TBD) Barriers to Discharge: Continued Medical Work up               Expected Discharge Plan and Services In-house Referral: Clinical Social Work   Post Acute Care Choice: NA Living arrangements for the past 2 months: Homeless                                       Social Drivers of Health (SDOH) Interventions SDOH Screenings   Food Insecurity: Food Insecurity Present (08/06/2024)  Housing: High Risk (08/06/2024)  Transportation Needs: Unmet Transportation Needs (08/06/2024)  Utilities: At Risk (08/06/2024)  Alcohol Screen: Low Risk  (04/25/2018)  Depression  (PHQ2-9): Low Risk  (02/17/2024)  Financial Resource Strain: Low Risk (06/23/2024)   Received from [facility]  Social Connections: Moderately Integrated (02/17/2024)  Stress: Stress Concern Present (03/20/2023)  Tobacco Use: High Risk (08/06/2024)    Readmission Risk Interventions     No data to display

## 2024-08-09 NOTE — Progress Notes (Signed)
 [facility] Progress Note   Subjective:   Reports she has had a headache throughout admission ongoing today, will give APAP at her request. Denies SOB, CP, palpitations, dizziness, nausea.  Seen on HD tolerating well  Objective Vitals:   08/08/24 1938 08/09/24 0602 08/09/24 0718 08/09/24 0726  BP: (!) 134/56 (!) 149/63 (!) 160/68 (!) 156/69  Pulse: 90 77 81 79  Resp: 16  15 19   Temp: 99.1 F (37.3 C) 98.8 F (37.1 C) 98.3 F (36.8 C)   TempSrc:  Oral Oral   SpO2: 99% 96% 98% 99%  Weight:   66.9 kg   Height:       Physical Exam General: alert female in NAD Heart: RRR, no murmurs Lungs: CTA bilaterally, respirations unlabored on RA Abdomen: Soft, non-distended, +BS Extremities: No edema b/l lower extremities Dialysis Access: AVF + t/b   Additional Objective Labs: Basic Metabolic Panel: Recent Labs  Lab 08/06/24 0434 08/07/24 0535 08/07/24 0732 08/08/24 0338  NA 142 137 140 135  K 5.4* 4.5 4.8 5.0  CL 101 99 99 96*  CO2 19* 28 23 27   GLUCOSE 209* 188* 149* 141*  BUN 127* 77* 78* 45*  CREATININE 16.26* 10.98* 11.05* 7.42*  CALCIUM  6.6* 6.7* 6.7* 7.1*  PHOS 8.4*  --  5.4* 4.4   Liver Function Tests: Recent Labs  Lab 08/07/24 0732 08/08/24 0338  ALBUMIN  2.6* 2.5*   No results for input(s): LIPASE, AMYLASE in the last 168 hours. CBC: Recent Labs  Lab 08/06/24 0106 08/06/24 0434 08/07/24 0535 08/08/24 0338  WBC 10.0 9.6 9.9 8.9  HGB 9.6* 9.1* 8.3* 8.3*  HCT 29.2* 27.1* 24.8* 25.2*  MCV 80.7 78.8* 79.2* 79.7*  PLT 271 262 223 213   Blood Culture    Component Value Date/Time   SDES URINE, RANDOM 11/14/2022 1902   SPECREQUEST  11/14/2022 1902    NONE Reflexed from [id number] Performed at [facility], [address] [place], [place] [address]    CULT >=100,000 COLONIES/mL ESCHERICHIA COLI (A) 11/14/2022 1902   REPTSTATUS 11/17/2022 FINAL 11/14/2022 1902    Cardiac Enzymes: No results for input(s): CKTOTAL, CKMB,  CKMBINDEX, TROPONINI in the last 168 hours. CBG: Recent Labs  Lab 08/07/24 2149 08/08/24 0815 08/08/24 1201 08/08/24 1552 08/08/24 2023  GLUCAP 121* 149* 299* 321* 209*   Iron  Studies:  No results for input(s): IRON , TIBC, TRANSFERRIN, FERRITIN in the last 72 hours.  @lablastinr3 @ Studies/Results: No results found. Medications:   amLODipine   10 mg Oral Daily   apixaban   2.5 mg Oral BID   atorvastatin   40 mg Oral Daily   carvedilol   6.25 mg Oral BID WC   Chlorhexidine  Gluconate Cloth  6 each Topical Q0600   Chlorhexidine  Gluconate Cloth  6 each Topical Q0600   darbepoetin (ARANESP ) injection - DIALYSIS  60 mcg Subcutaneous Q Sat-1800   hydrALAZINE   25 mg Oral BID   insulin  aspart  0-5 Units Subcutaneous QHS   insulin  aspart  0-6 Units Subcutaneous TID WC   isosorbide mononitrate  30 mg Oral Daily   melatonin  5 mg Oral QHS   pantoprazole   40 mg Oral Daily   sertraline   50 mg Oral Daily   sodium chloride  flush  3 mL Intravenous Q12H    Dialysis Orders:  previously [place], no HD unit now \   Assessment/Plan:  Hyperkalemia: Improved with lokelma  and HD, continue low K diet.   ESRD:  No dialysis for 2 weeks. Had short HD  Friday and Saturday. Will plan for next HD today, not sure what her outpatient HD schedule will be yet.  Will need an outpatient HD unit. Renal navigators will initiate CLIP today.   Hypertension/volume: BP elevated, home meds resumed and it's improved, UF with HD today.   Anemia: Hgb 8.3. Started aranesp .   Metabolic bone disease: Calcium  low, PTH pending. Phos now at goal, continue binders.   Nutrition: Renal diet and fluid restrictions.     Manuelita Barters MD [facility] Pager [phone]

## 2024-08-09 NOTE — Progress Notes (Signed)
 PROGRESS NOTE        PATIENT DETAILS Name: Stacey Lucas Age: 48 y.o. Sex: female Date of Birth: 02-15-1976 Admit Date: 08/06/2024 Admitting Physician Evalene GORMAN Sprinkles, MD ERE:Wztopw, [name], MD  Brief Summary: Patient is a 48 y.o.  female with history of prior CVA with mild right-sided hemiparesis, ESRD on HD, HTN, DM-2-homelessness-who apparently moving to [facility] recently-has not had dialysis in several weeks-presented to the hospital with fatigue/malaise-found to have hyperkalemia.  Significant events: 11/28>> admit to [facility]  Significant studies: 11/28>> CXR: No PNA  Significant microbiology data: None  Procedures: None  Consults: Nephrology  Subjective:  Patient in bed, appears comfortable, denies any headache, no fever, no chest pain or pressure, no shortness of breath , no abdominal pain. No new focal weakness.   Objective: Vitals: Blood pressure (!) 156/65, pulse 87, temperature 98.3 F (36.8 C), temperature source Oral, resp. rate 19, height 5' 5 (1.651 m), weight 66.9 kg, last menstrual period 10/11/2019, SpO2 98%.   Exam:  Awake Alert, No new F.N deficits, Normal affect [place].AT,PERRAL Supple Neck, No JVD,   Symmetrical Chest wall movement, Good air movement bilaterally, CTAB RRR,No Gallops, Rubs or new Murmurs,  +ve B.Sounds, Abd Soft, No tenderness,   No Cyanosis, Clubbing or edema   Assessment/Plan:  Hyperkalemia Secondary to missed HD Treated with Lokelma  and then with HD Potassium is normalized.  ESRD on HD Missed several weeks of hemodialysis Unfortunately-patient does not have a dedicated outpatient HD center-she is homeless-and apparently comes to the ED quite a bit for dialysis. Underwent HD yesterday-plans are for HD again today. Will need TOC/diabetic coordinator evaluation prior to discharge.  Normocytic anemia Likely related to ESRD No evidence of blood loss. Aranesp /Iron  defer to nephrology  service.  HTN BP relatively stable Continue amlodipine /Coreg , add low-dose Imdur  for better control  HLD Statin  PAF Telemetry monitoring Eliquis   History of CVA with mild right-sided hemiparesis/dysarthria Stable Apparently does use cane/walker Eliquis /statin. Await PT/OT eval.  Homelessness TOC team following  Suicidal ideation.  Expressed during HD session few days ago.  Psych called.  Sitter.  Currently no suicidal ideation.  Monitor.    DM-2 (A1c 5.6 on 10/28) CBG stable on SSI  Recent Labs    08/08/24 1201 08/08/24 1552 08/08/24 2023  GLUCAP 299* 321* 209*       Code status:   Code Status: Limited: Do not attempt resuscitation (DNR) -DNR-LIMITED -Do Not Intubate/DNI    DVT Prophylaxis: apixaban  (ELIQUIS ) tablet 2.5 mg Start: 08/06/24 1000 apixaban  (ELIQUIS ) tablet 2.5 mg    Family Communication: None at bedside   Disposition Plan: Status is: Inpatient Remains inpatient appropriate because: Severity of illness   Planned Discharge Destination:Home   Diet: Diet Order             Diet renal with fluid restriction Fluid restriction: 1200 mL Fluid; Room service appropriate? Yes; Fluid consistency: Thin  Diet effective now                     Antimicrobial agents: Anti-infectives (From admission, onward)    None        MEDICATIONS: Scheduled Meds:  amLODipine   10 mg Oral Daily   apixaban   2.5 mg Oral BID   atorvastatin   40 mg Oral Daily   carvedilol   6.25 mg Oral BID WC   Chlorhexidine  Gluconate Cloth  6 each Topical Q0600   Chlorhexidine  Gluconate Cloth  6 each Topical Q0600   darbepoetin (ARANESP ) injection - DIALYSIS  60 mcg Subcutaneous Q Sat-1800   hydrALAZINE   25 mg Oral BID   insulin  aspart  0-5 Units Subcutaneous QHS   insulin  aspart  0-6 Units Subcutaneous TID WC   isosorbide  mononitrate  30 mg Oral Daily   melatonin  5 mg Oral QHS   pantoprazole   40 mg Oral Daily   sertraline   50 mg Oral Daily   sodium chloride   flush  3 mL Intravenous Q12H   Continuous Infusions: PRN Meds:.acetaminophen  **OR** acetaminophen , senna   I have personally reviewed following labs and imaging studies  LABORATORY DATA: CBC: Recent Labs  Lab 08/06/24 0106 08/06/24 0434 08/07/24 0535 08/08/24 0338  WBC 10.0 9.6 9.9 8.9  HGB 9.6* 9.1* 8.3* 8.3*  HCT 29.2* 27.1* 24.8* 25.2*  MCV 80.7 78.8* 79.2* 79.7*  PLT 271 262 223 213    Basic Metabolic Panel: Recent Labs  Lab 08/06/24 0106 08/06/24 0434 08/07/24 0535 08/07/24 0732 08/08/24 0338  NA 140 142 137 140 135  K 5.9* 5.4* 4.5 4.8 5.0  CL 99 101 99 99 96*  CO2 19* 19* 28 23 27   GLUCOSE 180* 209* 188* 149* 141*  BUN 126* 127* 77* 78* 45*  CREATININE 16.38* 16.26* 10.98* 11.05* 7.42*  CALCIUM  6.8* 6.6* 6.7* 6.7* 7.1*  PHOS  --  8.4*  --  5.4* 4.4    GFR: Estimated Creatinine Clearance: 8.3 mL/min (A) (by C-G formula based on SCr of 7.42 mg/dL (H)).  Liver Function Tests: Recent Labs  Lab 08/07/24 0732 08/08/24 0338  ALBUMIN  2.6* 2.5*   No results for input(s): LIPASE, AMYLASE in the last 168 hours. No results for input(s): AMMONIA in the last 168 hours.  Coagulation Profile: No results for input(s): INR, PROTIME in the last 168 hours.  Cardiac Enzymes: No results for input(s): CKTOTAL, CKMB, CKMBINDEX, TROPONINI in the last 168 hours.  BNP (last 3 results) No results for input(s): PROBNP in the last 8760 hours.  Lipid Profile: No results for input(s): CHOL, HDL, LDLCALC, TRIG, CHOLHDL, LDLDIRECT in the last 72 hours.  Thyroid Function Tests: No results for input(s): TSH, T4TOTAL, FREET4, T3FREE, THYROIDAB in the last 72 hours.  Anemia Panel: No results for input(s): VITAMINB12, FOLATE, FERRITIN, TIBC, IRON , RETICCTPCT in the last 72 hours.   Urine analysis:    Component Value Date/Time   COLORURINE AMBER (A) 11/14/2022 1902   APPEARANCEUR CLOUDY (A) 11/14/2022 1902   LABSPEC  1.018 11/14/2022 1902   PHURINE 7.0 11/14/2022 1902   GLUCOSEU NEGATIVE 11/14/2022 1902   HGBUR NEGATIVE 11/14/2022 1902   BILIRUBINUR NEGATIVE 11/14/2022 1902   KETONESUR 5 (A) 11/14/2022 1902   PROTEINUR >=300 (A) 11/14/2022 1902   NITRITE NEGATIVE 11/14/2022 1902   LEUKOCYTESUR MODERATE (A) 11/14/2022 1902    Sepsis Labs: Lactic Acid, Venous    Component Value Date/Time   LATICACIDVEN 0.52 12/29/2016 1522    MICROBIOLOGY: Recent Results (from the past 240 hours)  MRSA Next Gen by PCR, Nasal     Status: None   Collection Time: 08/06/24  4:47 AM   Specimen: Nasal Mucosa; Nasal Swab  Result Value Ref Range Status   MRSA by PCR Next Gen NOT DETECTED NOT DETECTED Final    Comment: (NOTE) The GeneXpert MRSA Assay (FDA approved for NASAL specimens only), is one component of a comprehensive MRSA colonization surveillance program. It is not intended to diagnose MRSA  infection nor to guide or monitor treatment for MRSA infections. Test performance is not FDA approved in patients less than 28 years old. Performed at [facility], [address] [place], [place] [address]     RADIOLOGY STUDIES/RESULTS: No results found.    LOS: 3 days   Lavada Stank, MD  [facility]    To contact the attending provider between 7A-7P or the covering provider during after hours 7P-7A, please log into the web site [email or web address] and access using universal [place] password for that web site. If you do not have the password, please call the hospital operator.  08/09/2024, 9:05 AM

## 2024-08-09 NOTE — Progress Notes (Signed)
 OT Cancellation Note  Patient Details Name: Stacey Lucas MRN: [id number] DOB: [birth year]   Cancelled Treatment:    Reason Eval/Treat Not Completed: Patient at procedure or test/ unavailable. Pt in hemodialysis.  Donny BECKER OT Acute Rehabilitation Services Office [phone]    [place] Dorothyann Distel 08/09/2024, 9:31 AM

## 2024-08-10 DIAGNOSIS — Z992 Dependence on renal dialysis: Secondary | ICD-10-CM | POA: Diagnosis not present

## 2024-08-10 DIAGNOSIS — N186 End stage renal disease: Secondary | ICD-10-CM | POA: Diagnosis not present

## 2024-08-10 LAB — GLUCOSE, CAPILLARY
Glucose-Capillary: 158 mg/dL — ABNORMAL HIGH (ref 70–99)
Glucose-Capillary: 193 mg/dL — ABNORMAL HIGH (ref 70–99)
Glucose-Capillary: 248 mg/dL — ABNORMAL HIGH (ref 70–99)
Glucose-Capillary: 263 mg/dL — ABNORMAL HIGH (ref 70–99)

## 2024-08-10 MED ORDER — INSULIN GLARGINE-YFGN 100 UNIT/ML ~~LOC~~ SOLN: 8.0000 [IU] | Freq: Every day | SUBCUTANEOUS | Status: DC

## 2024-08-10 MED ORDER — SERTRALINE HCL 50 MG PO TABS
75.0000 mg | ORAL_TABLET | Freq: Every day | ORAL | Status: DC
  Administered 2024-08-11 – 2024-08-14 (×4): 75 mg via ORAL
  Filled 2024-08-10 (×4): qty 1

## 2024-08-10 MED ORDER — INSULIN GLARGINE-YFGN 100 UNIT/ML ~~LOC~~ SOLN
6.0000 [IU] | Freq: Every day | SUBCUTANEOUS | Status: DC
  Administered 2024-08-10 – 2024-08-13 (×4): 6 [IU] via SUBCUTANEOUS
  Filled 2024-08-10 (×4): qty 0.06

## 2024-08-10 NOTE — Progress Notes (Addendum)
 PROGRESS NOTE        PATIENT DETAILS Name: Stacey Lucas Age: 48 y.o. Sex: female Date of Birth: [birth year] Admit Date: 08/06/2024 Admitting Physician Evalene GORMAN Sprinkles, MD ERE:Wztopw, [name], MD  Brief Summary: Patient is a 48 y.o.  female with history of prior CVA with mild right-sided hemiparesis, ESRD on HD, HTN, DM-2-homelessness-who apparently moving to [facility] recently-has not had dialysis in several weeks-presented to the hospital with fatigue/malaise-found to have hyperkalemia.  Significant events: 11/28>> admit to [facility]  Significant studies: 11/28>> CXR: No PNA  Significant microbiology data: None  Procedures: None  Consults: Nephrology  Subjective:  Patient in bed, appears comfortable, denies any headache, no fever, no chest pain or pressure, no shortness of breath , no abdominal pain. No new focal weakness.    Objective: Vitals: Blood pressure (!) 120/55, pulse 85, temperature 98.5 F (36.9 C), temperature source Oral, resp. rate 19, height 5' 5 (1.651 m), weight 66.9 kg, last menstrual period 10/11/2019, SpO2 99%.   Exam:  Awake Alert, No new F.N deficits, Normal affect [name].AT,PERRAL Supple Neck, No JVD,   Symmetrical Chest wall movement, Good air movement bilaterally, CTAB RRR,No Gallops, Rubs or new Murmurs,  +ve B.Sounds, Abd Soft, No tenderness,   No Cyanosis, Clubbing or edema   Assessment/Plan:  Hyperkalemia Secondary to missed HD Treated with Lokelma  and then with HD Potassium is normalized.  ESRD on HD Missed several weeks of hemodialysis Unfortunately-patient does not have a dedicated outpatient HD center-she is homeless-and apparently comes to the ED quite a bit for dialysis. Underwent HD yesterday-plans are for HD again today. Will need TOC/diabetic coordinator evaluation prior to discharge.  Normocytic anemia Likely related to ESRD No evidence of blood loss. Aranesp /Iron  defer to nephrology  service.  HTN BP relatively stable Continue amlodipine /Coreg , add low-dose Imdur for better control  HLD Statin  PAF Telemetry monitoring Eliquis   History of CVA with mild right-sided hemiparesis/dysarthria Stable Apparently does use cane/walker Eliquis /statin. Await PT/OT eval.  Homelessness TOC team following  Suicidal ideation.  Expressed during HD session few days ago.  Psych called.  Sitter.  Currently no suicidal ideation.  Monitor.    DM-2 (A1c 5.6 on 10/28) CBGs slightly elevated low-dose Lantus  added along with sliding scale.  Monitor and adjust.  Recent Labs    08/09/24 1149 08/09/24 1649 08/09/24 2206  GLUCAP 201* 239* 282*    Lab Results  Component Value Date   HGBA1C 6.5 11/12/2023      Code status:   Code Status: Limited: Do not attempt resuscitation (DNR) -DNR-LIMITED -Do Not Intubate/DNI    DVT Prophylaxis: apixaban  (ELIQUIS ) tablet 2.5 mg Start: 08/06/24 1000 apixaban  (ELIQUIS ) tablet 2.5 mg    Family Communication: None at bedside   Disposition Plan: Status is: Inpatient Remains inpatient appropriate because: Severity of illness   Planned Discharge Destination:Home   Diet: Diet Order             Diet renal with fluid restriction Fluid restriction: 1200 mL Fluid; Room service appropriate? Yes; Fluid consistency: Thin  Diet effective now                     Antimicrobial agents: Anti-infectives (From admission, onward)    None        MEDICATIONS: Scheduled Meds:  amLODipine   10 mg Oral Daily   apixaban   2.5 mg Oral  BID   atorvastatin   40 mg Oral Daily   carvedilol   6.25 mg Oral BID WC   Chlorhexidine  Gluconate Cloth  6 each Topical Q0600   Chlorhexidine  Gluconate Cloth  6 each Topical Q0600   darbepoetin (ARANESP ) injection - DIALYSIS  60 mcg Subcutaneous Q Sat-1800   hydrALAZINE   25 mg Oral BID   insulin  aspart  0-5 Units Subcutaneous QHS   insulin  aspart  0-6 Units Subcutaneous TID WC   insulin   glargine-yfgn  8 Units Subcutaneous Daily   isosorbide mononitrate  30 mg Oral Daily   melatonin  5 mg Oral QHS   pantoprazole   40 mg Oral Daily   sertraline   50 mg Oral Daily   sodium chloride  flush  3 mL Intravenous Q12H   traZODone   50 mg Oral QHS   Continuous Infusions: PRN Meds:.acetaminophen  **OR** acetaminophen , senna   I have personally reviewed following labs and imaging studies  LABORATORY DATA: CBC: Recent Labs  Lab 08/06/24 0106 08/06/24 0434 08/07/24 0535 08/08/24 0338 08/09/24 0728  WBC 10.0 9.6 9.9 8.9 9.3  HGB 9.6* 9.1* 8.3* 8.3* 7.9*  HCT 29.2* 27.1* 24.8* 25.2* 24.6*  MCV 80.7 78.8* 79.2* 79.7* 81.2  PLT 271 262 223 213 220    Basic Metabolic Panel: Recent Labs  Lab 08/06/24 0434 08/07/24 0535 08/07/24 0732 08/08/24 0338 08/09/24 0728  NA 142 137 140 135 138  K 5.4* 4.5 4.8 5.0 5.9*  CL 101 99 99 96* 98  CO2 19* 28 23 27 27   GLUCOSE 209* 188* 149* 141* 146*  BUN 127* 77* 78* 45* 62*  CREATININE 16.26* 10.98* 11.05* 7.42* 9.05*  CALCIUM  6.6* 6.7* 6.7* 7.1* 7.5*  PHOS 8.4*  --  5.4* 4.4 4.7*    GFR: Estimated Creatinine Clearance: 6.8 mL/min (A) (by C-G formula based on SCr of 9.05 mg/dL (H)).  Liver Function Tests: Recent Labs  Lab 08/07/24 0732 08/08/24 0338 08/09/24 0728  ALBUMIN  2.6* 2.5* 2.7*   No results for input(s): LIPASE, AMYLASE in the last 168 hours. No results for input(s): AMMONIA in the last 168 hours.  Coagulation Profile: No results for input(s): INR, PROTIME in the last 168 hours.  Cardiac Enzymes: No results for input(s): CKTOTAL, CKMB, CKMBINDEX, TROPONINI in the last 168 hours.  BNP (last 3 results) No results for input(s): PROBNP in the last 8760 hours.  Lipid Profile: No results for input(s): CHOL, HDL, LDLCALC, TRIG, CHOLHDL, LDLDIRECT in the last 72 hours.  Thyroid Function Tests: No results for input(s): TSH, T4TOTAL, FREET4, T3FREE, THYROIDAB in the last 72  hours.  Anemia Panel: No results for input(s): VITAMINB12, FOLATE, FERRITIN, TIBC, IRON , RETICCTPCT in the last 72 hours.   Urine analysis:    Component Value Date/Time   COLORURINE AMBER (A) 11/14/2022 1902   APPEARANCEUR CLOUDY (A) 11/14/2022 1902   LABSPEC 1.018 11/14/2022 1902   PHURINE 7.0 11/14/2022 1902   GLUCOSEU NEGATIVE 11/14/2022 1902   HGBUR NEGATIVE 11/14/2022 1902   BILIRUBINUR NEGATIVE 11/14/2022 1902   KETONESUR 5 (A) 11/14/2022 1902   PROTEINUR >=300 (A) 11/14/2022 1902   NITRITE NEGATIVE 11/14/2022 1902   LEUKOCYTESUR MODERATE (A) 11/14/2022 1902    Sepsis Labs: Lactic Acid, Venous    Component Value Date/Time   LATICACIDVEN 0.52 12/29/2016 1522    MICROBIOLOGY: Recent Results (from the past 240 hours)  MRSA Next Gen by PCR, Nasal     Status: None   Collection Time: 08/06/24  4:47 AM   Specimen: Nasal Mucosa; Nasal Swab  Result Value Ref Range Status   MRSA by PCR Next Gen NOT DETECTED NOT DETECTED Final    Comment: (NOTE) The GeneXpert MRSA Assay (FDA approved for NASAL specimens only), is one component of a comprehensive MRSA colonization surveillance program. It is not intended to diagnose MRSA infection nor to guide or monitor treatment for MRSA infections. Test performance is not FDA approved in patients less than 68 years old. Performed at [facility], [address], [place], [place] [address]     RADIOLOGY STUDIES/RESULTS: No results found.    LOS: 4 days   Lavada Stank, MD  [facility]    To contact the attending provider between 7A-7P or the covering provider during after hours 7P-7A, please log into the web site [email or web address] and access using universal [place] password for that web site. If you do not have the password, please call the hospital operator.  08/10/2024, 7:45 AM

## 2024-08-10 NOTE — [place] FL2 (Addendum)
 Stacey Lucas  MEDICAID FL2 LEVEL OF CARE FORM     IDENTIFICATION  Patient Name: Stacey Lucas Birthdate: [birth year] Sex: female Admission Date (Current Location): 08/06/2024  [facility] and [place] Number:  Producer, Television/film/video and Address:  [facility], [address] [place], [place] [address]      Provider Number: [id number]  Attending Physician Name and Address:  Dennise Lavada POUR, MD  Relative Name and Phone Number:  Stacey Lucas (daughter) [phone]    Current Level of Care: Hospital Recommended Level of Care: Skilled Nursing Facility Prior Approval Number:    Date Approved/Denied:   PASRR Number: Pending  Discharge Plan: SNF    Current Diagnoses: Patient Active Problem List   Diagnosis Date Noted   Major depressive disorder, recurrent episode, moderate (HCC) 08/08/2024   ESRD needing dialysis (HCC) 08/06/2024   Prolonged QT interval 08/06/2024   Volume overload 11/02/2023   Acute pulmonary edema (HCC) 11/02/2023   Elevated troponin 11/02/2023   Leukocytosis 11/02/2023   SOB (shortness of breath) 11/02/2023   History of anemia due to chronic kidney disease 11/02/2023   Hyperkalemia 11/01/2023   Hypertensive emergency 07/07/2023   Gait disturbance, post-stroke 03/06/2023   ESRD (end stage renal disease) (HCC) 02/04/2023   Polysubstance abuse (HCC) 12/03/2022   Nephrotic syndrome 11/15/2022   Cerebrovascular accident (CVA) (HCC) 11/15/2022   Anemia 01/16/2022   Paroxysmal atrial fibrillation (HCC) 07/04/2021   H/O: stroke 07/04/2021   Osteoarthritis of right knee 05/24/2021   DM2 (diabetes mellitus, type 2) (HCC) 05/23/2021   Essential hypertension 05/23/2021   Hyperlipidemia 05/23/2021   Thyroid nodule 05/23/2021   MDD (major depressive disorder), recurrent severe, without psychosis (HCC) 04/27/2018    Orientation RESPIRATION BLADDER Height & Weight     Self, Time, Situation, Place  Normal Continent Weight: 147 lb 7.8 oz (66.9  kg) Height:  5' 5 (165.1 cm)  BEHAVIORAL SYMPTOMS/MOOD NEUROLOGICAL BOWEL NUTRITION STATUS      Continent Diet (See d/c summary)  AMBULATORY STATUS COMMUNICATION OF NEEDS Skin   Limited Assist Verbally Normal                       Personal Care Assistance Level of Assistance  Bathing, Dressing, Feeding Bathing Assistance: Limited assistance Feeding assistance: Independent Dressing Assistance: Limited assistance     Functional Limitations Info             SPECIAL CARE FACTORS FREQUENCY  PT (By licensed PT), OT (By licensed OT)     PT Frequency: 5x/week OT Frequency: 5x/week            Contractures Contractures Info: Not present    Additional Factors Info  Code Status, Allergies Code Status Info: DNR - limited Allergies Info: Neurontin  (Gabapentin )           Current Medications (08/10/2024):  This is the current hospital active medication list Current Facility-Administered Medications  Medication Dose Route Frequency Provider Last Rate Last Admin   acetaminophen  (TYLENOL ) tablet 650 mg  650 mg Oral Q6H PRN Opyd, Timothy S, MD   650 mg at 08/09/24 9192   Or   acetaminophen  (TYLENOL ) suppository 650 mg  650 mg Rectal Q6H PRN Opyd, Timothy S, MD       amLODipine  (NORVASC ) tablet 10 mg  10 mg Oral Daily Opyd, Timothy S, MD   10 mg at 08/10/24 0827   apixaban  (ELIQUIS ) tablet 2.5 mg  2.5 mg Oral BID Opyd, Timothy S, MD  2.5 mg at 08/10/24 9176   atorvastatin  (LIPITOR ) tablet 40 mg  40 mg Oral Daily Opyd, Timothy S, MD   40 mg at 08/10/24 0823   carvedilol  (COREG ) tablet 6.25 mg  6.25 mg Oral BID WC Opyd, Timothy S, MD   6.25 mg at 08/10/24 9176   Chlorhexidine  Gluconate Cloth 2 % PADS 6 each  6 each Topical Q0600 Collins, Samantha G, PA-C   6 each at 08/10/24 0600   Chlorhexidine  Gluconate Cloth 2 % PADS 6 each  6 each Topical Q0600 Collins, Samantha G, PA-C   6 each at 08/09/24 9396   Darbepoetin Alfa  (ARANESP ) injection 60 mcg  60 mcg Subcutaneous Q Sat-1800  Coladonato, Joseph, MD   60 mcg at 08/07/24 1801   hydrALAZINE  (APRESOLINE ) tablet 25 mg  25 mg Oral BID Opyd, Timothy S, MD   25 mg at 08/09/24 2231   insulin  aspart (novoLOG ) injection 0-5 Units  0-5 Units Subcutaneous QHS Opyd, Timothy S, MD   3 Units at 08/09/24 2229   insulin  aspart (novoLOG ) injection 0-6 Units  0-6 Units Subcutaneous TID WC Opyd, Timothy S, MD   1 Units at 08/10/24 9173   insulin  glargine-yfgn (SEMGLEE ) injection 6 Units  6 Units Subcutaneous Daily Singh, Prashant K, MD       isosorbide  mononitrate (IMDUR ) 24 hr tablet 30 mg  30 mg Oral Daily Singh, Prashant K, MD   30 mg at 08/10/24 9176   melatonin tablet 5 mg  5 mg Oral QHS Akintayo, Musa A, MD   5 mg at 08/09/24 2230   pantoprazole  (PROTONIX ) EC tablet 40 mg  40 mg Oral Daily Coladonato, Joseph, MD   40 mg at 08/10/24 9176   senna (SENOKOT) tablet 8.6 mg  1 tablet Oral Daily PRN Opyd, Timothy S, MD   8.6 mg at 08/08/24 0857   sertraline  (ZOLOFT ) tablet 50 mg  50 mg Oral Daily Akintayo, Musa A, MD   50 mg at 08/10/24 9176   sodium chloride  flush (NS) 0.9 % injection 3 mL  3 mL Intravenous Q12H Opyd, Timothy S, MD   3 mL at 08/10/24 9170   traZODone  (DESYREL ) tablet 50 mg  50 mg Oral QHS Stevens, Briana N, MD   50 mg at 08/09/24 2230     Discharge Medications: Please see discharge summary for a list of discharge medications.  Relevant Imaging Results:  Relevant Lab Results:   Additional Information SSN# [id number]; requires OP Hemodialysis. Center and chair time to be determined based on accepting SNF location.   Stacey Lucas  [place], LCSWA

## 2024-08-10 NOTE — Progress Notes (Signed)
 RE: Stacey Lucas Date of Birth: 12/21/1975 Date: 08/10/24   Please be advised that the above-named patient will require a short-term nursing home stay - anticipated 30 days or less for rehabilitation and strengthening.  The plan is for return home.

## 2024-08-10 NOTE — Consult Note (Addendum)
 [facility] Psychiatric Consult Initial  Patient Name: Stacey Lucas  MRN: [id number]  DOB: 09/18/1975  Consult Order details:  Orders (From admission, onward)     Start     Ordered   08/07/24 1427  IP CONSULT TO PSYCHIATRY       Ordering Provider: Raenelle Donalda HERO, MD  Provider:  (Not yet assigned)  Question Answer Comment  Location [facility]   Reason for Consult? Depression      08/07/24 1427            Mode of Visit: In person   Psychiatry Consult Evaluation  Service Date: August 10, 2024 LOS:  LOS: 4 days  Chief Complaint: I have been feeling more depressed and nervous since I found out my husband of 10 years was cheating on me.  Primary Psychiatric Diagnoses  Major depressive disorder, recurrent  2.  Cocaine use disorder and cocaine-induced mood disorder   Assessment  Stacey Lucas is a 48 y.o. female admitted: Medically on 08/06/2024 12:39 AM for fatigue/malaise after missing her dialysis for 2 weeks. She carries the psychiatric diagnoses of Cocaine use disorder and cocaine induced mood disorder and has a past medical history prior CVA with mild right-sided hemiparesis, ESRD on HD, HTN, and DM-2. Psychiatry consulted for evaluation of depression. Her current presentation of low mood, fatigue, anhedonia, insomnia, crying spells, and nervousness in the context of psychosocial stressors including current homelessness and infidelity from husband are most consistent with Major Depressive Disorder.   12/2: Patient demonstrates low mood, psychomotor slowing, and dysthymic affect. She reports improved sleep last night with trazodone . Tolerating Zoloft  well without side effects. Endorses passive SI without intent or plan. Will titrate Zoloft  to 75 mg since patient is tolerating well.   Plan   ## Psychiatric Medication Recommendations:  --Increase Zoloft  to 75 mg daily for depression/anxiety. This is considered relatively safe in this patient with  prolonged Qtc (551) and CKD. Please monitor Qtc daily.  --Continue melatonin 5 mg at bedtime for sleep --Continue trazodone  50 mg at bedtime for sleep --Continue medical treatment as the primary team  ## Medical Decision Making Capacity: Not specifically addressed in this encounter  ## Further Work-up:  -- Per primary team -- TSH, B12, Folate -- EKG on 08/06/24 had QtC of 551. Please monitor QTC daily.  -- Pertinent labwork reviewed earlier this admission includes: BUN-45, CR-7.4, HB-8.3, GFR-6  ## Disposition:-- There are no psychiatric contraindications to discharge at this time  ## Behavioral / Environmental: - No specific recommendations at this time.     ## Safety and Observation Level:  - Based on my clinical evaluation, I estimate the patient to be at low risk of self harm in the current setting. - At this time, we recommend routine monitoring. This decision is based on my review of the chart including patient's history and current presentation, interview of the patient, mental status examination, and consideration of suicide risk including evaluating suicidal ideation, plan, intent, suicidal or self-harm behaviors, risk factors, and protective factors. This judgment is based on our ability to directly address suicide risk, implement suicide prevention strategies, and develop a safety plan while the patient is in the clinical setting. Please contact our team if there is a concern that risk level has changed.  CSSR Risk Category:C-SSRS RISK CATEGORY: Moderate Risk  Suicide Risk Assessment: Patient has following modifiable risk factors for suicide: untreated depression, recklessness, and medication noncompliance, which we are addressing by prescribing medications. Patient has following non-modifiable  or demographic risk factors for suicide: separation or divorce Patient has the following protective factors against suicide: Supportive family and Cultural, spiritual, or religious  beliefs that discourage suicide  Thank you for this consult request. Recommendations have been communicated to the primary team. We will follow at this time.   Ashley LOISE Gravely, MD       History of Present Illness  Relevant Aspects of [facility] Course:  Admitted on 08/06/2024 after missing her dialysis for 2 weeks.    Initial Patient Report:  Patient seen face to face in her hospital room. She is awake, alert and oriented to time, place, person and situation. When asked about the reason for this admission, patient states she missed her dialysis for two weeks and became weak and fatigued, necessitating her presentation to the hospital. On further enquiry, patient states she's been feeling overwhelmed, depressed, worry, apprehensive, nervous and has difficulty sleeping for over 2-3 months because her husband of 10 years was cheating on her and brought the lady into their home. As a result, patient states she had to move out of the house and currently lives with a friend. Patient reports difficulty sleeping, low energy level, and lack of motivation, but denies suicidal or homicidal ideation, intent or plan. She denies previous history of suicide attempts or self-injurious behavior. Patient states she would like to be alive for her 50 adult children aged 51, 63 and 33 years whom she often communicates with.    Patient denies auditory or visual hallucinations and denies delusions. She denies previous history of physical or sexual abuse, and denies nightmares or intrusive thoughts. When asked about drug use, patient states she's been using cocaine and smoke cigarette for years saying using cocaine is the only way I get through my day. This writer advised patient to stay away from drug use considering its adverse effects on her medical and mental health.    Review of Systems  Psychiatric/Behavioral:  Positive for depression and substance abuse. Negative for hallucinations and suicidal ideas. The  patient is nervous/anxious and has insomnia.      Psychiatric and Social History  Psychiatric History:  Information collected from the patient  Prev Dx/Sx: Cocaine use disorder and cocaine induced mood disorder Current Psych Provider: none Home Meds (current): Bupropion  XL 150 mg but does not use.  Previous Med Trials: Duloxetine  30 mg daily Therapy: none  Prior Psych Hospitalization: patient denies  Prior Self Harm: Patient denies Prior Violence: Patient denies  Family Psych History: denies Family Hx suicide: denies  Social History:  Educational Hx: dropped out of high school Occupational Hx: Currently unemployed, on disability benefit. Used to work as advertising copywriter at [employer] Hx: patient denies Living Situation: Currently homeless-Lives with her friend after moving out of her husband home for cheating on her. Spiritual Hx: unsure Access to weapons/lethal means: patient denies    Substance History Alcohol: denies  Type of alcohol: N/A Last Drink: denies Number of drinks per day: denies History of alcohol withdrawal seizures: N/A History of DT's: N/A Tobacco: Yes, smokes half pack of cigarette daily since age 68 years Illicit drugs: Yes, cocaine Prescription drug abuse: denies Rehab hx: denies  Exam Findings   Physical Exam:  Vital Signs:  Temp:  [98 F (36.7 C)-99.3 F (37.4 C)] 98.5 F (36.9 C) (12/02 0758) Pulse Rate:  [76-88] 85 (12/01 2331) Resp:  [15-20] 19 (12/02 0600) BP: (120-159)/(54-143) 133/54 (12/02 0827) SpO2:  [99 %-100 %] 99 % (12/01 2331) Blood pressure ROLLEN)  133/54, pulse 85, temperature 98.5 F (36.9 C), temperature source Oral, resp. rate 19, height 5' 5 (1.651 m), weight 66.9 kg, last menstrual period 10/11/2019, SpO2 99%. Body mass index is 24.54 kg/m.  Physical Exam Vitals and nursing note reviewed.  Constitutional:      General: She is not in acute distress.    Appearance: Normal appearance. She is normal weight. She is not  ill-appearing.  HENT:     Head: Normocephalic and atraumatic.  Pulmonary:     Effort: Pulmonary effort is normal. No respiratory distress.  Neurological:     General: No focal deficit present.     Mental Status: She is alert and oriented to person, place, and time. Mental status is at baseline.    Mental Status Exam: General Appearance: Casual  Orientation:  Full (Time, Place, and Person)  Memory:  Immediate;   Good Recent;   Good Remote;   Good  Concentration:  Concentration: Good and Attention Span: Good  Recall:  Good  Attention  Fair  Eye Contact:  Fair  Speech:  Slow  Language:  Good  Volume:  Decreased  Mood: I'm here  Affect:  Constricted  Thought Process:  Coherent and Linear  Thought Content:  Logical  Suicidal Thoughts:  No  Homicidal Thoughts:  No  Judgement:  Fair  Insight:  Fair  Psychomotor Activity:  Decreased  Akathisia:  No  Fund of Knowledge:  Good   Assets:  Communication Skills  Cognition:  WNL  ADL's:  Intact  AIMS (if indicated):  N/A   Other History   These have been pulled in through the EMR, reviewed, and updated if appropriate.   Family History:  The patient's family history includes Arthritis in her mother; Diabetes in her father and mother; Hypertension in her father and mother.  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  12/29/2021   Anxiety and depression    Atrial fibrillation (HCC)    Cerebral thrombosis with cerebral infarction 05/24/2021   Cocaine abuse (HCC)    Depression with suicidal ideation    [facility] admission 04/2018   Diabetes mellitus without complication (HCC)    Type II   End stage kidney disease (HCC)    GERD (gastroesophageal reflux disease)    Hyperlipidemia    Hypertension    Impingement syndrome of right shoulder    Migraine headache    Osteoarthritis of right knee 05/24/2021    Pilonidal abscess 05/2013   Pyelonephritis    Right thyroid nodule    Sciatica    Surgical History: Past Surgical History:  Procedure Laterality Date   AV FISTULA PLACEMENT Left 03/17/2023   Procedure: LEFT ARM BRACHIOCEPHALIC ARTERIOVENOUS (AV) FISTULA CREATION;  Surgeon: Lanis Fonda BRAVO, MD;  Location: [facility] OR;  Service: Vascular;  Laterality: Left;   BUBBLE STUDY  01/01/2022   Procedure: BUBBLE STUDY;  Surgeon: Alvan Ronal BRAVO, MD;  Location: [facility] ENDOSCOPY;  Service: Cardiovascular;;   IR FLUORO GUIDE CV LINE RIGHT  03/14/2023   IR US  GUIDE VASC ACCESS RIGHT  03/14/2023   TEE WITHOUT CARDIOVERSION N/A 01/01/2022   Procedure: TRANSESOPHAGEAL ECHOCARDIOGRAM (TEE);  Surgeon: Alvan Ronal BRAVO, MD;  Location: [facility] ENDOSCOPY;  Service: Cardiovascular;  Laterality: N/A;   tubal     TUBAL LIGATION     Medications:   Current Facility-Administered Medications:    acetaminophen  (TYLENOL ) tablet 650 mg, 650 mg, Oral, Q6H PRN,  650 mg at 08/09/24 0807 **OR** acetaminophen  (TYLENOL ) suppository 650 mg, 650 mg, Rectal, Q6H PRN, Opyd, Evalene RAMAN, MD   amLODipine  (NORVASC ) tablet 10 mg, 10 mg, Oral, Daily, Opyd, Timothy S, MD, 10 mg at 08/10/24 0827   apixaban  (ELIQUIS ) tablet 2.5 mg, 2.5 mg, Oral, BID, Opyd, Timothy S, MD, 2.5 mg at 08/10/24 9176   atorvastatin  (LIPITOR ) tablet 40 mg, 40 mg, Oral, Daily, Opyd, Timothy S, MD, 40 mg at 08/10/24 9176   carvedilol  (COREG ) tablet 6.25 mg, 6.25 mg, Oral, BID WC, Opyd, Timothy S, MD, 6.25 mg at 08/10/24 9176   Chlorhexidine  Gluconate Cloth 2 % PADS 6 each, 6 each, Topical, Q0600, Collins, Samantha G, PA-C, 6 each at 08/10/24 0600   Darbepoetin Alfa  (ARANESP ) injection 60 mcg, 60 mcg, Subcutaneous, Q Sat-1800, Coladonato, Fairy, MD, 60 mcg at 08/07/24 1801   hydrALAZINE  (APRESOLINE ) tablet 25 mg, 25 mg, Oral, BID, Opyd, Timothy S, MD, 25 mg at 08/09/24 2231   insulin  aspart (novoLOG ) injection 0-5 Units, 0-5 Units, Subcutaneous, QHS, Opyd, Timothy S, MD, 3 Units at  08/09/24 2229   insulin  aspart (novoLOG ) injection 0-6 Units, 0-6 Units, Subcutaneous, TID WC, Opyd, Timothy S, MD, 1 Units at 08/10/24 9173   insulin  glargine-yfgn (SEMGLEE ) injection 6 Units, 6 Units, Subcutaneous, Daily, Singh, Prashant K, MD   isosorbide mononitrate (IMDUR) 24 hr tablet 30 mg, 30 mg, Oral, Daily, Singh, Prashant K, MD, 30 mg at 08/10/24 9176   melatonin tablet 5 mg, 5 mg, Oral, QHS, Akintayo, Musa A, MD, 5 mg at 08/09/24 2230   pantoprazole  (PROTONIX ) EC tablet 40 mg, 40 mg, Oral, Daily, Coladonato, Fairy, MD, 40 mg at 08/10/24 9176   senna (SENOKOT) tablet 8.6 mg, 1 tablet, Oral, Daily PRN, Opyd, Timothy S, MD, 8.6 mg at 08/08/24 0857   sertraline  (ZOLOFT ) tablet 50 mg, 50 mg, Oral, Daily, Akintayo, Musa A, MD, 50 mg at 08/10/24 9176   sodium chloride  flush (NS) 0.9 % injection 3 mL, 3 mL, Intravenous, Q12H, Opyd, Timothy S, MD, 3 mL at 08/10/24 0829   traZODone  (DESYREL ) tablet 50 mg, 50 mg, Oral, QHS, Carynn Felling N, MD, 50 mg at 08/09/24 2230  Allergies: Allergies  Allergen Reactions   Neurontin  [Gabapentin ] Other (See Comments)    Weakness  Balance impairment     Ashley LOISE Gravely, MD PGY-1

## 2024-08-10 NOTE — Plan of Care (Signed)
  Problem: Education: Goal: Knowledge of General Education information will improve Description: Including pain rating scale, medication(s)/side effects and non-pharmacologic comfort measures Outcome: Progressing   Problem: Clinical Measurements: Goal: Will remain free from infection Outcome: Progressing Goal: Respiratory complications will improve Outcome: Progressing   Problem: Pain Managment: Goal: General experience of comfort will improve and/or be controlled Outcome: Progressing   Problem: Health Behavior/Discharge Planning: Goal: Ability to manage health-related needs will improve Outcome: Not Progressing   Problem: Elimination: Goal: Will not experience complications related to urinary retention Outcome: Not Progressing

## 2024-08-10 NOTE — Progress Notes (Signed)
 Physical Therapy Treatment Patient Details Name: Stacey Lucas MRN: [id number] DOB: 1976/07/24 Today's Date: 08/10/2024   History of Present Illness 48 yo female presenting to ED requesting HD (missing HD for 2 weeks) pt workup reveals hyperkalemia.  PMH CVA L pons, L basal ganglia, anxiety depression suicidal ideations, DM, HLD HTN migraines cocaine abuse, ESRD on HD, PAF on eliquis ,    PT Comments  PT focus of session on functional transfers to/from w/c. Pt tolerating x2 transfers and OOB time x20 minutes, overall requiring min steadying assist and w/c management help during transfers. PT attempted to have pt move leg rests and lock/unlock w/c brakes, but pt unable and when PT asked pt how she does this at home pt states I don't. PT with concerns about safety awareness and insight into deficits. Plan remains appropriate.      If plan is discharge home, recommend the following: A little help with bathing/dressing/bathroom;Assist for transportation;Help with stairs or ramp for entrance;A little help with walking and/or transfers   Can travel by private vehicle        Equipment Recommendations  None recommended by PT    Recommendations for Other Services       Precautions / Restrictions Precautions Precautions: Fall Recall of Precautions/Restrictions: Intact Restrictions Weight Bearing Restrictions Per Provider Order: No     Mobility  Bed Mobility Overal bed mobility: Needs Assistance Bed Mobility: Supine to Sit     Supine to sit: Supervision, HOB elevated, Used rails          Transfers Overall transfer level: Needs assistance Equipment used: Rolling walker (2 wheels) Transfers: Bed to chair/wheelchair/BSC, Sit to/from Stand Sit to Stand: Contact guard assist Stand pivot transfers: Min assist         General transfer comment: close guard for safety for stand, min assist for pivot to/from w/c for w/c management, steadying pt and RW, safe eccentric lower into  chair.    Ambulation/Gait               General Gait Details: does not ambulate at baseline   Stairs             Wheelchair Mobility     Tilt Bed    Modified Rankin (Stroke Patients Only)       Balance Overall balance assessment: Needs assistance Sitting-balance support: No upper extremity supported, Feet supported Sitting balance-Leahy Scale: Good     Standing balance support: Bilateral upper extremity supported, During functional activity Standing balance-Leahy Scale: Fair                              Hotel Manager: No apparent difficulties  Cognition Arousal: Alert Behavior During Therapy: WFL for tasks assessed/performed, Flat affect   PT - Cognitive impairments: Problem solving, Safety/Judgement                         Following commands: Intact      Cueing Cueing Techniques: Verbal cues  Exercises      General Comments        Pertinent Vitals/Pain Pain Assessment Pain Assessment: No/denies pain    Home Living                          Prior Function            PT Goals (current goals can now be found in the care  plan section) Acute Rehab PT Goals Patient Stated Goal: to get better PT Goal Formulation: With patient Time For Goal Achievement: 08/22/24 Potential to Achieve Goals: Good Progress towards PT goals: Progressing toward goals    Frequency    Min 1X/week      PT Plan      Co-evaluation              AM-PAC PT 6 Clicks Mobility   Outcome Measure  Help needed turning from your back to your side while in a flat bed without using bedrails?: A Little Help needed moving from lying on your back to sitting on the side of a flat bed without using bedrails?: A Little Help needed moving to and from a bed to a chair (including a wheelchair)?: A Little Help needed standing up from a chair using your arms (e.g., wheelchair or bedside chair)?: A Lot Help  needed to walk in hospital room?: Total Help needed climbing 3-5 steps with a railing? : Total 6 Click Score: 13    End of Session   Activity Tolerance: Patient tolerated treatment well Patient left: in chair;with call bell/phone within reach;with nursing/sitter in room;with chair alarm set Nurse Communication: Mobility status PT Visit Diagnosis: Other abnormalities of gait and mobility (R26.89);Unsteadiness on feet (R26.81);Muscle weakness (generalized) (M62.81)     Time: 8740-8678 PT Time Calculation (min) (ACUTE ONLY): 22 min  Charges:    $Therapeutic Activity: 8-22 mins PT General Charges $$ ACUTE PT VISIT: 1 Visit                     Johana RAMAN, PT [name] Acute Rehabilitation Services Secure Chat Preferred  Office [phone]    Teshawn Moan E Joh[place] 08/10/2024, 2:27 PM

## 2024-08-10 NOTE — Progress Notes (Signed)
 [facility] Progress Note   Subjective:    Completed dialysis yesterday net UF 2L  Feels ok. Denies cp, sob.  Dispo pending   Objective Vitals:   08/10/24 0100 08/10/24 0600 08/10/24 0758 08/10/24 0827  BP:   (!) 133/54 (!) 133/54  Pulse:      Resp: 17 19    Temp:   98.5 F (36.9 C)   TempSrc:   Oral   SpO2:      Weight:      Height:       Physical Exam General: alert female in NAD Heart: RRR, no murmurs Lungs: CTA bilaterally, respirations unlabored on RA Abdomen: Soft, non-distended, +BS Extremities: No edema b/l lower extremities Dialysis Access: AVF + t/b   Additional Objective Labs: Basic Metabolic Panel: Recent Labs  Lab 08/07/24 0732 08/08/24 0338 08/09/24 0728  NA 140 135 138  K 4.8 5.0 5.9*  CL 99 96* 98  CO2 23 27 27   GLUCOSE 149* 141* 146*  BUN 78* 45* 62*  CREATININE 11.05* 7.42* 9.05*  CALCIUM  6.7* 7.1* 7.5*  PHOS 5.4* 4.4 4.7*   Liver Function Tests: Recent Labs  Lab 08/07/24 0732 08/08/24 0338 08/09/24 0728  ALBUMIN  2.6* 2.5* 2.7*   No results for input(s): LIPASE, AMYLASE in the last 168 hours. CBC: Recent Labs  Lab 08/06/24 0106 08/06/24 0434 08/07/24 0535 08/08/24 0338 08/09/24 0728  WBC 10.0 9.6 9.9 8.9 9.3  HGB 9.6* 9.1* 8.3* 8.3* 7.9*  HCT 29.2* 27.1* 24.8* 25.2* 24.6*  MCV 80.7 78.8* 79.2* 79.7* 81.2  PLT 271 262 223 213 220   Blood Culture    Component Value Date/Time   SDES URINE, RANDOM 11/14/2022 1902   SPECREQUEST  11/14/2022 1902    NONE Reflexed from [id number] Performed at [facility], [address], [place], [place] [address]    CULT >=100,000 COLONIES/mL ESCHERICHIA COLI (A) 11/14/2022 1902   REPTSTATUS 11/17/2022 FINAL 11/14/2022 1902    Cardiac Enzymes: No results for input(s): CKTOTAL, CKMB, CKMBINDEX, TROPONINI in the last 168 hours. CBG: Recent Labs  Lab 08/08/24 2023 08/09/24 1149 08/09/24 1649 08/09/24 2206 08/10/24 0804  GLUCAP 209* 201* 239* 282* 158*    Iron  Studies:  No results for input(s): IRON , TIBC, TRANSFERRIN, FERRITIN in the last 72 hours.  @lablastinr3 @ Studies/Results: No results found. Medications:   amLODipine   10 mg Oral Daily   apixaban   2.5 mg Oral BID   atorvastatin   40 mg Oral Daily   carvedilol   6.25 mg Oral BID WC   Chlorhexidine  Gluconate Cloth  6 each Topical Q0600   Chlorhexidine  Gluconate Cloth  6 each Topical Q0600   darbepoetin (ARANESP ) injection - DIALYSIS  60 mcg Subcutaneous Q Sat-1800   hydrALAZINE   25 mg Oral BID   insulin  aspart  0-5 Units Subcutaneous QHS   insulin  aspart  0-6 Units Subcutaneous TID WC   insulin  glargine-yfgn  6 Units Subcutaneous Daily   isosorbide mononitrate  30 mg Oral Daily   melatonin  5 mg Oral QHS   pantoprazole   40 mg Oral Daily   sertraline   50 mg Oral Daily   sodium chloride  flush  3 mL Intravenous Q12H   traZODone   50 mg Oral QHS    Dialysis Orders:  previously [facility], no HD unit now   Assessment/Plan: Hyperkalemia: Improved with lokelma  and HD, continue low K diet. Trend labs.  ESRD:  No dialysis for 2 weeks. Resumed HD here on 11/28. Had HD Monday. No  acute dialysis needs today. Next HD Wed. Not sure what her outpatient HD schedule will be yet.  Will need an outpatient HD unit. Renal navigators initiating CLIP. Placement will depend on dispo - may be going to SNF.  Hypertension/volume: BP elevated, home meds resumed and it's improved, UF with HD  Anemia: Hgb 7-8s. Tsat 63%.  Started aranesp .  Metabolic bone disease: Corr Ca ok. PTH 192. Phos now at goal, continue binders.  Nutrition: Renal diet and fluid restrictions.    Maisie Ronnald Acosta PA-C [facility] 08/10/2024,9:25 AM

## 2024-08-10 NOTE — Plan of Care (Signed)
  Problem: Education: Goal: Ability to describe self-care measures that may prevent or decrease complications (Diabetes Survival Skills Education) will improve Outcome: Progressing Goal: Individualized Educational Video(s) Outcome: Progressing   Problem: Coping: Goal: Ability to adjust to condition or change in health will improve Outcome: Progressing   Problem: Fluid Volume: Goal: Ability to maintain a balanced intake and output will improve Outcome: Progressing   Problem: Health Behavior/Discharge Planning: Goal: Ability to identify and utilize available resources and services will improve Outcome: Progressing Goal: Ability to manage health-related needs will improve Outcome: Progressing   Problem: Metabolic: Goal: Ability to maintain appropriate glucose levels will improve Outcome: Progressing   Problem: Nutritional: Goal: Maintenance of adequate nutrition will improve Outcome: Progressing Goal: Progress toward achieving an optimal weight will improve Outcome: Progressing   Problem: Skin Integrity: Goal: Risk for impaired skin integrity will decrease Outcome: Progressing   Problem: Tissue Perfusion: Goal: Adequacy of tissue perfusion will improve Outcome: Progressing   Problem: Education: Goal: Knowledge of General Education information will improve Description: Including pain rating scale, medication(s)/side effects and non-pharmacologic comfort measures Outcome: Progressing   Problem: Health Behavior/Discharge Planning: Goal: Ability to manage health-related needs will improve Outcome: Progressing   Problem: Clinical Measurements: Goal: Ability to maintain clinical measurements within normal limits will improve Outcome: Progressing Goal: Will remain free from infection Outcome: Progressing Goal: Diagnostic test results will improve Outcome: Progressing Goal: Respiratory complications will improve Outcome: Progressing Goal: Cardiovascular complication will  be avoided Outcome: Progressing   Problem: Activity: Goal: Risk for activity intolerance will decrease Outcome: Progressing   Problem: Nutrition: Goal: Adequate nutrition will be maintained Outcome: Progressing   Problem: Coping: Goal: Level of anxiety will decrease Outcome: Progressing   Problem: Elimination: Goal: Will not experience complications related to bowel motility Outcome: Progressing Goal: Will not experience complications related to urinary retention Outcome: Progressing   Problem: Pain Managment: Goal: General experience of comfort will improve and/or be controlled Outcome: Progressing   Problem: Safety: Goal: Ability to remain free from injury will improve Outcome: Progressing   Problem: Skin Integrity: Goal: Risk for impaired skin integrity will decrease Outcome: Progressing   Problem: Education: Goal: Knowledge of disease and its progression will improve Outcome: Progressing Goal: Individualized Educational Video(s) Outcome: Progressing   Problem: Fluid Volume: Goal: Compliance with measures to maintain balanced fluid volume will improve Outcome: Progressing   Problem: Health Behavior/Discharge Planning: Goal: Ability to manage health-related needs will improve Outcome: Progressing   Problem: Nutritional: Goal: Ability to make healthy dietary choices will improve Outcome: Progressing   Problem: Clinical Measurements: Goal: Complications related to the disease process, condition or treatment will be avoided or minimized Outcome: Progressing

## 2024-08-11 ENCOUNTER — Telehealth: Payer: Self-pay | Admitting: Family Medicine

## 2024-08-11 DIAGNOSIS — N186 End stage renal disease: Secondary | ICD-10-CM | POA: Diagnosis not present

## 2024-08-11 DIAGNOSIS — Z992 Dependence on renal dialysis: Secondary | ICD-10-CM | POA: Diagnosis not present

## 2024-08-11 LAB — RENAL FUNCTION PANEL
Albumin: 2.6 g/dL — ABNORMAL LOW (ref 3.5–5.0)
Anion gap: 14 (ref 5–15)
BUN: 57 mg/dL — ABNORMAL HIGH (ref 6–20)
CO2: 30 mmol/L (ref 22–32)
Calcium: 7.7 mg/dL — ABNORMAL LOW (ref 8.9–10.3)
Chloride: 96 mmol/L — ABNORMAL LOW (ref 98–111)
Creatinine, Ser: 7.49 mg/dL — ABNORMAL HIGH (ref 0.44–1.00)
GFR, Estimated: 6 mL/min — ABNORMAL LOW (ref >60)
Glucose, Bld: 87 mg/dL (ref 70–99)
Phosphorus: 4.8 mg/dL — ABNORMAL HIGH (ref 2.5–4.6)
Potassium: 4.5 mmol/L (ref 3.5–5.1)
Sodium: 140 mmol/L (ref 135–145)

## 2024-08-11 LAB — CBC
HCT: 24.1 % — ABNORMAL LOW (ref 36.0–46.0)
Hemoglobin: 7.7 g/dL — ABNORMAL LOW (ref 12.0–15.0)
MCH: 25.9 pg — ABNORMAL LOW (ref 26.0–34.0)
MCHC: 32 g/dL (ref 30.0–36.0)
MCV: 81.1 fL (ref 80.0–100.0)
Platelets: 202 K/uL (ref 150–400)
RBC: 2.97 MIL/uL — ABNORMAL LOW (ref 3.87–5.11)
RDW: 16.9 % — ABNORMAL HIGH (ref 11.5–15.5)
WBC: 8.4 K/uL (ref 4.0–10.5)
nRBC: 0 % (ref 0.0–0.2)

## 2024-08-11 LAB — GLUCOSE, CAPILLARY
Glucose-Capillary: 92 mg/dL (ref 70–99)
Glucose-Capillary: 123 mg/dL — ABNORMAL HIGH (ref 70–99)
Glucose-Capillary: 317 mg/dL — ABNORMAL HIGH (ref 70–99)
Glucose-Capillary: 225 mg/dL — ABNORMAL HIGH (ref 70–99)

## 2024-08-11 MED ORDER — ANTICOAGULANT SODIUM CITRATE 4% (200MG/5ML) IV SOLN: 5.0000 mL | Status: DC | PRN

## 2024-08-11 MED ORDER — PENTAFLUOROPROP-TETRAFLUOROETH EX AERO: 1.0000 | INHALATION_SPRAY | CUTANEOUS | Status: DC | PRN

## 2024-08-11 MED ORDER — LIDOCAINE HCL (PF) 1 % IJ SOLN: 5.0000 mL | INTRAMUSCULAR | Status: DC | PRN

## 2024-08-11 MED ORDER — HEPARIN SODIUM (PORCINE) 1000 UNIT/ML DIALYSIS: 1000.0000 [IU] | INTRAMUSCULAR | Status: DC | PRN

## 2024-08-11 MED ORDER — LIDOCAINE-PRILOCAINE 2.5-2.5 % EX CREA: 1.0000 | TOPICAL_CREAM | CUTANEOUS | Status: DC | PRN

## 2024-08-11 MED ORDER — HEPARIN SODIUM (PORCINE) 1000 UNIT/ML DIALYSIS: 20.0000 [IU]/kg | INTRAMUSCULAR | Status: DC | PRN

## 2024-08-11 MED ORDER — ALTEPLASE 2 MG IJ SOLR: 2.0000 mg | Freq: Once | INTRAMUSCULAR | Status: DC | PRN

## 2024-08-11 MED ORDER — HEPARIN SODIUM (PORCINE) 1000 UNIT/ML IJ SOLN
INTRAMUSCULAR | Status: AC
  Filled 2024-08-11: qty 4

## 2024-08-11 NOTE — Progress Notes (Signed)
 [facility] Progress Note   Subjective:    Seen in room. Says legs are sore today. Worked with PT yesterday.  Dialysis today.   Objective Vitals:   08/10/24 2350 08/11/24 0024 08/11/24 0600 08/11/24 0828  BP: (!) 115/52   (!) (P) 124/54  Pulse: 85     Resp: 12 18 16    Temp: 98.5 F (36.9 C)   98.5 F (36.9 C)  TempSrc: Oral   Oral  SpO2: 99%     Weight:      Height:       Physical Exam General: alert female in NAD Heart: RRR, no murmurs Lungs: CTA bilaterally, respirations unlabored on RA Abdomen: Soft, non-distended, +BS Extremities: No edema b/l lower extremities Dialysis Access: AVF + t/b   Additional Objective Labs: Basic Metabolic Panel: Recent Labs  Lab 08/08/24 0338 08/09/24 0728 08/11/24 0512  NA 135 138 140  K 5.0 5.9* 4.5  CL 96* 98 96*  CO2 27 27 30   GLUCOSE 141* 146* 87  BUN 45* 62* 57*  CREATININE 7.42* 9.05* 7.49*  CALCIUM  7.1* 7.5* 7.7*  PHOS 4.4 4.7* 4.8*   Liver Function Tests: Recent Labs  Lab 08/08/24 0338 08/09/24 0728 08/11/24 0512  ALBUMIN  2.5* 2.7* 2.6*   No results for input(s): LIPASE, AMYLASE in the last 168 hours. CBC: Recent Labs  Lab 08/06/24 0434 08/07/24 0535 08/08/24 0338 08/09/24 0728 08/11/24 0512  WBC 9.6 9.9 8.9 9.3 8.4  HGB 9.1* 8.3* 8.3* 7.9* 7.7*  HCT 27.1* 24.8* 25.2* 24.6* 24.1*  MCV 78.8* 79.2* 79.7* 81.2 81.1  PLT 262 223 213 220 202   Blood Culture    Component Value Date/Time   SDES URINE, RANDOM 11/14/2022 1902   SPECREQUEST  11/14/2022 1902    NONE Reflexed from [id number] Performed at [facility], [address] [place], [place] [address]    CULT >=100,000 COLONIES/mL ESCHERICHIA COLI (A) 11/14/2022 1902   REPTSTATUS 11/17/2022 FINAL 11/14/2022 1902    Cardiac Enzymes: No results for input(s): CKTOTAL, CKMB, CKMBINDEX, TROPONINI in the last 168 hours. CBG: Recent Labs  Lab 08/10/24 0804 08/10/24 1154 08/10/24 1624 08/10/24 2134 08/11/24 0835   GLUCAP 158* 193* 248* 263* 92   Iron  Studies:  No results for input(s): IRON , TIBC, TRANSFERRIN, FERRITIN in the last 72 hours.  @lablastinr3 @ Studies/Results: No results found. Medications:   amLODipine   10 mg Oral Daily   apixaban   2.5 mg Oral BID   atorvastatin   40 mg Oral Daily   carvedilol   6.25 mg Oral BID WC   Chlorhexidine  Gluconate Cloth  6 each Topical Q0600   darbepoetin (ARANESP ) injection - DIALYSIS  60 mcg Subcutaneous Q Sat-1800   hydrALAZINE   25 mg Oral BID   insulin  aspart  0-5 Units Subcutaneous QHS   insulin  aspart  0-6 Units Subcutaneous TID WC   insulin  glargine-yfgn  6 Units Subcutaneous Daily   isosorbide mononitrate  30 mg Oral Daily   melatonin  5 mg Oral QHS   pantoprazole   40 mg Oral Daily   sertraline   75 mg Oral Daily   sodium chloride  flush  3 mL Intravenous Q12H   traZODone   50 mg Oral QHS    Dialysis Orders:  previously [facility], no HD unit now   Assessment/Plan: Hyperkalemia: Improved with lokelma  and HD, continue low K diet. Trend labs.  ESRD:  No dialysis for 2 weeks. Resumed HD here on 11/28. Continue HD MWF. HD today.  Not sure what her  outpatient HD schedule will be yet.  Will need an outpatient HD unit. Renal navigators initiating CLIP. Placement will depend on dispo - may be going to SNF.  Hypertension/volume: BP elevated, home meds resumed and it's improved, UF with HD  Anemia: Hgb 7-8s. Tsat 63%.  Started aranesp .  Metabolic bone disease: Corr Ca ok. PTH 192. Phos now at goal, continue binders.  Nutrition: Renal diet and fluid restrictions.    Maisie Ronnald Acosta PA-C [facility] 08/11/2024,10:31 AM

## 2024-08-11 NOTE — Progress Notes (Signed)
 PROGRESS NOTE        PATIENT DETAILS Name: Stacey Lucas Age: 48 y.o. Sex: female Date of Birth: 1975/12/22 Admit Date: 08/06/2024 Admitting Physician Evalene GORMAN Sprinkles, MD ERE:Wztopw, [name], MD  Brief Summary: Patient is a 48 y.o.  female with history of prior CVA with mild right-sided hemiparesis, ESRD on HD, HTN, DM-2-homelessness-who apparently moving to [facility] recently-has not had dialysis in several weeks-presented to the hospital with fatigue/malaise-found to have hyperkalemia.  Significant events: 11/28>> admit to [facility]  Significant studies: 11/28>> CXR: No PNA  Significant microbiology data: None  Procedures: None  Consults: Nephrology, psych  Subjective: Patient in bed, appears comfortable, denies any headache, no fever, no chest pain or pressure, no shortness of breath , no abdominal pain. No focal weakness.    Objective: Vitals: Blood pressure (!) (P) 124/54, pulse 85, temperature 98.5 F (36.9 C), temperature source Oral, resp. rate 16, height 5' 5 (1.651 m), weight 66.9 kg, last menstrual period 10/11/2019, SpO2 99%.   Exam:  Awake Alert, No new F.N deficits, Normal affect [place].AT,PERRAL Supple Neck, No JVD,   Symmetrical Chest wall movement, Good air movement bilaterally, CTAB RRR,No Gallops, Rubs or new Murmurs,  +ve B.Sounds, Abd Soft, No tenderness,   No Cyanosis, Clubbing or edema   Assessment/Plan:  Hyperkalemia Secondary to missed HD Treated with Lokelma  and then with HD Potassium is normalized.  ESRD on HD Missed several weeks of hemodialysis Unfortunately-patient does not have a dedicated outpatient HD center-she is homeless-and apparently comes to the ED quite a bit for dialysis. Underwent HD yesterday-plans are for HD again today. Will need TOC/diabetic coordinator evaluation prior to discharge.  Normocytic anemia Likely related to ESRD No evidence of blood loss. Aranesp /Iron  defer to nephrology  service.  HTN BP relatively stable Continue amlodipine /Coreg , add low-dose Imdur for better control  HLD Statin  PAF Telemetry monitoring Eliquis   History of CVA with mild right-sided hemiparesis/dysarthria Stable Apparently does use cane/walker Eliquis /statin. Seen by PT OT and will require SNF  Homelessness TOC team following  Suicidal ideation.  Expressed during HD session few days ago.  Psych saw and cleared the patient.  Sitter.  Currently no suicidal ideation.  Monitor.    DM-2 (A1c 5.6 on 10/28) CBGs slightly elevated low-dose Lantus  added along with sliding scale.  Monitor and adjust.  Recent Labs    08/10/24 1624 08/10/24 2134 08/11/24 0835  GLUCAP 248* 263* 92    Lab Results  Component Value Date   HGBA1C 6.5 11/12/2023      Code status:   Code Status: Limited: Do not attempt resuscitation (DNR) -DNR-LIMITED -Do Not Intubate/DNI    DVT Prophylaxis: apixaban  (ELIQUIS ) tablet 2.5 mg Start: 08/06/24 1000 apixaban  (ELIQUIS ) tablet 2.5 mg    Family Communication: None at bedside   Disposition Plan: Status is: Inpatient Remains inpatient appropriate because: Severity of illness   Planned Discharge Destination:Home   Diet: Diet Order             Diet renal with fluid restriction Fluid restriction: 1200 mL Fluid; Room service appropriate? Yes; Fluid consistency: Thin  Diet effective now                     Antimicrobial agents: Anti-infectives (From admission, onward)    None        MEDICATIONS: Scheduled Meds:  amLODipine   10 mg  Oral Daily   apixaban   2.5 mg Oral BID   atorvastatin   40 mg Oral Daily   carvedilol   6.25 mg Oral BID WC   Chlorhexidine  Gluconate Cloth  6 each Topical Q0600   darbepoetin (ARANESP ) injection - DIALYSIS  60 mcg Subcutaneous Q Sat-1800   hydrALAZINE   25 mg Oral BID   insulin  aspart  0-5 Units Subcutaneous QHS   insulin  aspart  0-6 Units Subcutaneous TID WC   insulin  glargine-yfgn  6 Units  Subcutaneous Daily   isosorbide mononitrate  30 mg Oral Daily   melatonin  5 mg Oral QHS   pantoprazole   40 mg Oral Daily   sertraline   75 mg Oral Daily   sodium chloride  flush  3 mL Intravenous Q12H   traZODone   50 mg Oral QHS   Continuous Infusions: PRN Meds:.acetaminophen  **OR** acetaminophen , senna   I have personally reviewed following labs and imaging studies  LABORATORY DATA: CBC: Recent Labs  Lab 08/06/24 0434 08/07/24 0535 08/08/24 0338 08/09/24 0728 08/11/24 0512  WBC 9.6 9.9 8.9 9.3 8.4  HGB 9.1* 8.3* 8.3* 7.9* 7.7*  HCT 27.1* 24.8* 25.2* 24.6* 24.1*  MCV 78.8* 79.2* 79.7* 81.2 81.1  PLT 262 223 213 220 202    Basic Metabolic Panel: Recent Labs  Lab 08/06/24 0434 08/07/24 0535 08/07/24 0732 08/08/24 0338 08/09/24 0728 08/11/24 0512  NA 142 137 140 135 138 140  K 5.4* 4.5 4.8 5.0 5.9* 4.5  CL 101 99 99 96* 98 96*  CO2 19* 28 23 27 27 30   GLUCOSE 209* 188* 149* 141* 146* 87  BUN 127* 77* 78* 45* 62* 57*  CREATININE 16.26* 10.98* 11.05* 7.42* 9.05* 7.49*  CALCIUM  6.6* 6.7* 6.7* 7.1* 7.5* 7.7*  PHOS 8.4*  --  5.4* 4.4 4.7* 4.8*    GFR: Estimated Creatinine Clearance: 8.2 mL/min (A) (by C-G formula based on SCr of 7.49 mg/dL (H)).  Liver Function Tests: Recent Labs  Lab 08/07/24 0732 08/08/24 0338 08/09/24 0728 08/11/24 0512  ALBUMIN  2.6* 2.5* 2.7* 2.6*   No results for input(s): LIPASE, AMYLASE in the last 168 hours. No results for input(s): AMMONIA in the last 168 hours.  Coagulation Profile: No results for input(s): INR, PROTIME in the last 168 hours.  Cardiac Enzymes: No results for input(s): CKTOTAL, CKMB, CKMBINDEX, TROPONINI in the last 168 hours.  BNP (last 3 results) No results for input(s): PROBNP in the last 8760 hours.  Lipid Profile: No results for input(s): CHOL, HDL, LDLCALC, TRIG, CHOLHDL, LDLDIRECT in the last 72 hours.  Thyroid Function Tests: No results for input(s): TSH,  T4TOTAL, FREET4, T3FREE, THYROIDAB in the last 72 hours.  Anemia Panel: No results for input(s): VITAMINB12, FOLATE, FERRITIN, TIBC, IRON , RETICCTPCT in the last 72 hours.   Urine analysis:    Component Value Date/Time   COLORURINE AMBER (A) 11/14/2022 1902   APPEARANCEUR CLOUDY (A) 11/14/2022 1902   LABSPEC 1.018 11/14/2022 1902   PHURINE 7.0 11/14/2022 1902   GLUCOSEU NEGATIVE 11/14/2022 1902   HGBUR NEGATIVE 11/14/2022 1902   BILIRUBINUR NEGATIVE 11/14/2022 1902   KETONESUR 5 (A) 11/14/2022 1902   PROTEINUR >=300 (A) 11/14/2022 1902   NITRITE NEGATIVE 11/14/2022 1902   LEUKOCYTESUR MODERATE (A) 11/14/2022 1902    Sepsis Labs: Lactic Acid, Venous    Component Value Date/Time   LATICACIDVEN 0.52 12/29/2016 1522    MICROBIOLOGY: Recent Results (from the past 240 hours)  MRSA Next Gen by PCR, Nasal     Status: None   Collection  Time: 08/06/24  4:47 AM   Specimen: Nasal Mucosa; Nasal Swab  Result Value Ref Range Status   MRSA by PCR Next Gen NOT DETECTED NOT DETECTED Final    Comment: (NOTE) The GeneXpert MRSA Assay (FDA approved for NASAL specimens only), is one component of a comprehensive MRSA colonization surveillance program. It is not intended to diagnose MRSA infection nor to guide or monitor treatment for MRSA infections. Test performance is not FDA approved in patients less than 42 years old. Performed at [facility], [address] [place], [place] [address]     RADIOLOGY STUDIES/RESULTS: No results found.    LOS: 5 days   Lavada Stank, MD  [facility]    To contact the attending provider between 7A-7P or the covering provider during after hours 7P-7A, please log into the web site [email or web address] and access using universal B and E password for that web site. If you do not have the password, please call the hospital operator.  08/11/2024, 9:04 AM

## 2024-08-11 NOTE — Progress Notes (Signed)
 Contacted by CSW regarding pt's acceptance at snf in [place]. Referral submitted to [facility] admissions requesting clinic placement in the [place] area. Attempted to reach staff at [facility] to provide pt's clinic an update but was unable to reach clinic staff via phone. Update provided to nephrologist and renal PA. Will assist as needed.   Randine Mungo Dialysis Navigator [phone]

## 2024-08-11 NOTE — Telephone Encounter (Signed)
 Patient returned call. Appointment scheduled for Hospital follow up 12/17 at 9:10 am. Patient acknowledged appointment.

## 2024-08-11 NOTE — TOC Progression Note (Addendum)
 Transition of Care [facility]) - Progression Note    Patient Details  Name: Stacey Lucas MRN: [id number] Date of Birth: 1976/07/04  Transition of Care [facility]) CM/SW Contact  Luann SHAUNNA [place], [place] Phone Number: 08/11/2024, 11:08 AM  Clinical Narrative:     CSW met with pt and provided SNF bed offers. Pt chooses [facility]. CSW explained offers are dependent on where OPHD can be arranged and pt's eventual discharge plans. Pt states she was staying with a friend, Mae([phone]), in [place] [address] and plans to return there after rehab.   CSW contacted [facility] and they do not have a bed available. The other offer pt had was [facility] and they also do not have a bed available.   CSW spoke with Tammy with [facility]. They are considering pt and Taya with [facility] would like to meet with pt today. Pt is agreeable to this.   1130: Taya met with pt and confirmed they can offer bed. CSW met with pt bedside who confirmed choice for [facility]. CSW explained OPHD will have to be arranged and explained insurance auth process as well.   CSW notified renal navigator who will work on getting OPHD chair time at a [place] clinic.   Auth request submitted in online portal Ref# [id number]. Auth is pending  Expected Discharge Plan: Skilled Nursing Facility Barriers to Discharge: Insurance Authorization, No SNF bed          Post Acute Care Choice: NA Living arrangements for the past 2 months: Homeless                                       Social Drivers of Health (SDOH) Interventions SDOH Screenings   Food Insecurity: Food Insecurity Present (08/06/2024)  Housing: High Risk (08/06/2024)  Transportation Needs: Unmet Transportation Needs (08/06/2024)  Utilities: At Risk (08/06/2024)  Alcohol Screen: Low Risk  (04/25/2018)  Depression (PHQ2-9): Low Risk  (02/17/2024)  Financial Resource Strain: Low Risk (06/23/2024)   Received from [facility]  Social  Connections: Moderately Integrated (02/17/2024)  Stress: Stress Concern Present (03/20/2023)  Tobacco Use: High Risk (08/06/2024)    Readmission Risk Interventions     No data to display

## 2024-08-11 NOTE — Consult Note (Signed)
 Stacey Lucas Initial  Patient Name: Stacey Lucas  MRN: [id number]  DOB: March 06, 1976  Lucas Order details: Orders (From admission, onward)     Start     Ordered   08/07/24 1427  IP Lucas TO PSYCHIATRY       Ordering Provider: Raenelle Donalda HERO, MD  Provider:  (Not yet assigned)  Question Answer Comment  Location [facility]   Reason for Lucas? Depression      08/07/24 1427            Mode of Visit: In person   Psychiatry Lucas Evaluation  Service Date: August 11, 2024 LOS:  LOS: 5 days  Chief Complaint: I have been feeling more depressed and nervous since I found out my husband of 10 years was cheating on me.  Primary Psychiatric Diagnoses  Major depressive disorder, recurrent 2.  Cocaine use disorder and cocaine-induced mood disorder   Assessment  Stacey Lucas is a 48 y.o. female admitted: Medically on 08/06/2024 12:39 AM for fatigue/malaise after missing her dialysis for 2 weeks. She carries the psychiatric diagnoses of Cocaine use disorder and cocaine induced mood disorder and has a past medical history prior CVA with mild right-sided hemiparesis, ESRD on HD, HTN, and DM-2. Psychiatry consulted for evaluation of depression. Her current presentation of low mood, fatigue, anhedonia, insomnia, crying spells, and nervousness in the context of psychosocial stressors including current homelessness and infidelity from husband are most consistent with Major Depressive Disorder.   12/2: Patient presents with slightly brighter affect today. Describes her mood as okay. She reports continued improvement in sleep since starting trazodone . She is tolerating Zoloft  well. Denies SI. No acute safety concerns. Recommend continuing Zoloft  75 mg daily and trazodone  50 mg daily at discharge. We will sign off at this time. Please re-Lucas if new concerns arise.  Plan   ## Psychiatric Medication Recommendations:  --Continue Zoloft  75 mg daily  for depression/anxiety. This is considered relatively safe in this patient with prolonged Qtc (551) and CKD. Please monitor Qtc daily.  --Continue melatonin 5 mg at bedtime for sleep --Continue trazodone  50 mg at bedtime for sleep --Continue medical treatment as the primary team  ## Medical Decision Making Capacity: Not specifically addressed in this encounter  ## Further Work-up:  -- Per primary team -- TSH, B12, Folate -- EKG on 08/06/24 had QtC of 551. Please monitor QTc. -- Pertinent labwork reviewed earlier this admission includes: BUN-45, CR-7.4, HB-8.3, GFR-6  ## Disposition:-- There are no psychiatric contraindications to discharge at this time  ## Behavioral / Environmental: - No specific recommendations at this time.     ## Safety and Observation Level:  - Based on my clinical evaluation, I estimate the patient to be at low risk of self harm in the current setting. - At this time, we recommend routine monitoring. This decision is based on my review of the chart including patient's history and current presentation, interview of the patient, mental status examination, and consideration of suicide risk including evaluating suicidal ideation, plan, intent, suicidal or self-harm behaviors, risk factors, and protective factors. This judgment is based on our ability to directly address suicide risk, implement suicide prevention strategies, and develop a safety plan while the patient is in the clinical setting. Please contact our team if there is a concern that risk level has changed.  CSSR Risk Category:C-SSRS RISK CATEGORY: Moderate Risk  Suicide Risk Assessment: Patient has following modifiable risk factors for suicide: untreated depression, recklessness, and medication noncompliance,  which we are addressing by prescribing medications. Patient has following non-modifiable or demographic risk factors for suicide: separation or divorce Patient has the following protective factors  against suicide: Supportive family and Cultural, spiritual, or religious beliefs that discourage suicide  Thank you for this Lucas request. Recommendations have been communicated to the primary team. We will sign off at this time.   Ashley LOISE Gravely, MD       History of Present Illness  Relevant Aspects of [facility] Course:  Admitted on 08/06/2024 after missing her dialysis for 2 weeks.    Initial Patient Report:  Patient seen face to face in her hospital room. She is awake, alert and oriented to time, place, person and situation. When asked about the reason for this admission, patient states she missed her dialysis for two weeks and became weak and fatigued, necessitating her presentation to the hospital. On further enquiry, patient states she's been feeling overwhelmed, depressed, worry, apprehensive, nervous and has difficulty sleeping for over 2-3 months because her husband of 10 years was cheating on her and brought the lady into their home. As a result, patient states she had to move out of the house and currently lives with a friend. Patient reports difficulty sleeping, low energy level, and lack of motivation, but denies suicidal or homicidal ideation, intent or plan. She denies previous history of suicide attempts or self-injurious behavior. Patient states she would like to be alive for her 37 adult children aged 49, 65 and 53 years whom she often communicates with.    Patient denies auditory or visual hallucinations and denies delusions. She denies previous history of physical or sexual abuse, and denies nightmares or intrusive thoughts. When asked about drug use, patient states she's been using cocaine and smoke cigarette for years saying using cocaine is the only way I get through my day. This writer advised patient to stay away from drug use considering its adverse effects on her medical and mental health.    Review of Systems  Psychiatric/Behavioral:  Positive for depression  and substance abuse. Negative for hallucinations and suicidal ideas. The patient is not nervous/anxious and does not have insomnia.      Psychiatric and Social History  Psychiatric History:  Information collected from the patient  Prev Dx/Sx: Cocaine use disorder and cocaine induced mood disorder Current Psych Provider: none Home Meds (current): Bupropion  XL 150 mg but does not use.  Previous Med Trials: Duloxetine  30 mg daily Therapy: none  Prior Psych Hospitalization: patient denies  Prior Self Harm: Patient denies Prior Violence: Patient denies  Family Psych History: denies Family Hx suicide: denies  Social History:  Educational Hx: dropped out of high school Occupational Hx: Currently unemployed, on disability benefit. Used to work as advertising copywriter at [employer] Hx: patient denies Living Situation: Currently homeless-Lives with her friend after moving out of her husband home for cheating on her. Spiritual Hx: unsure Access to weapons/lethal means: patient denies    Substance History Alcohol: denies  Type of alcohol: N/A Last Drink: denies Number of drinks per day: denies History of alcohol withdrawal seizures: N/A History of DT's: N/A Tobacco: Yes, smokes half pack of cigarette daily since age 34 years Illicit drugs: Yes, cocaine Prescription drug abuse: denies Rehab hx: denies  Exam Findings   Physical Exam:  Vital Signs:  Temp:  [98.4 F (36.9 C)-98.6 F (37 C)] 98.5 F (36.9 C) (12/03 0828) Pulse Rate:  [75-90] 75 (12/03 0929) Resp:  [12-18] 18 (12/03 0929) BP: (  103-127)/(46-52) (P) 124/54 (12/03 0828) SpO2:  [98 %-100 %] 98 % (12/03 0929) Blood pressure (!) (P) 124/54, pulse 75, temperature 98.5 F (36.9 C), temperature source Oral, resp. rate 18, height 5' 5 (1.651 m), weight 66.9 kg, last menstrual period 10/11/2019, SpO2 98%. Body mass index is 24.54 kg/m.  Physical Exam Vitals and nursing note reviewed.  Constitutional:      General: She  is not in acute distress.    Appearance: Normal appearance. She is normal weight. She is not ill-appearing.  HENT:     Head: Normocephalic and atraumatic.  Pulmonary:     Effort: Pulmonary effort is normal. No respiratory distress.  Neurological:     General: No focal deficit present.     Mental Status: She is alert and oriented to person, place, and time. Mental status is at baseline.    Mental Status Exam: General Appearance: Casual  Orientation:  Full (Time, Place, and Person)  Memory:  Immediate;   Good Recent;   Good Remote;   Good  Concentration:  Concentration: Good and Attention Span: Good  Recall:  Good  Attention  Fair  Eye Contact:  Fair  Speech:  Slow  Language:  Good  Volume:  Decreased  Mood: Okay  Affect:  Constricted  Thought Process:  Coherent and Linear  Thought Content:  Logical  Suicidal Thoughts:  No  Homicidal Thoughts:  No  Judgement:  Fair  Insight:  Fair  Psychomotor Activity:  Decreased  Akathisia:  No  Fund of Knowledge:  Good   Assets:  Communication Skills  Cognition:  WNL  ADL's:  Intact  AIMS (if indicated):  N/A   Other History   These have been pulled in through the EMR, reviewed, and updated if appropriate.   Family History:  The patient's family history includes Arthritis in her mother; Diabetes in her father and mother; Hypertension in her father and mother.  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  12/29/2021   Anxiety and depression    Atrial fibrillation (HCC)    Cerebral thrombosis with cerebral infarction 05/24/2021   Cocaine abuse (HCC)    Depression with suicidal ideation    [facility] admission 04/2018   Diabetes mellitus without complication (HCC)    Type II   End stage kidney disease (HCC)    GERD (gastroesophageal reflux disease)    Hyperlipidemia    Hypertension    Impingement syndrome of  right shoulder    Migraine headache    Osteoarthritis of right knee 05/24/2021   Pilonidal abscess 05/2013   Pyelonephritis    Right thyroid nodule    Sciatica    Surgical History: Past Surgical History:  Procedure Laterality Date   AV FISTULA PLACEMENT Left 03/17/2023   Procedure: LEFT ARM BRACHIOCEPHALIC ARTERIOVENOUS (AV) FISTULA CREATION;  Surgeon: Lanis Fonda BRAVO, MD;  Location: [facility] OR;  Service: Vascular;  Laterality: Left;   BUBBLE STUDY  01/01/2022   Procedure: BUBBLE STUDY;  Surgeon: Alvan Ronal BRAVO, MD;  Location: [facility] ENDOSCOPY;  Service: Cardiovascular;;   IR FLUORO GUIDE CV LINE RIGHT  03/14/2023   IR US  GUIDE VASC ACCESS RIGHT  03/14/2023   TEE WITHOUT CARDIOVERSION N/A 01/01/2022   Procedure: TRANSESOPHAGEAL ECHOCARDIOGRAM (TEE);  Surgeon: Alvan Ronal BRAVO, MD;  Location: [facility] ENDOSCOPY;  Service: Cardiovascular;  Laterality: N/A;   tubal     TUBAL LIGATION     Medications:  Current Facility-Administered Medications:    acetaminophen  (TYLENOL ) tablet 650 mg, 650 mg, Oral, Q6H PRN, 650 mg at 08/11/24 0844 **OR** acetaminophen  (TYLENOL ) suppository 650 mg, 650 mg, Rectal, Q6H PRN, Opyd, Evalene RAMAN, MD   amLODipine  (NORVASC ) tablet 10 mg, 10 mg, Oral, Daily, Opyd, Timothy S, MD, 10 mg at 08/10/24 0827   apixaban  (ELIQUIS ) tablet 2.5 mg, 2.5 mg, Oral, BID, Opyd, Timothy S, MD, 2.5 mg at 08/11/24 0839   atorvastatin  (LIPITOR ) tablet 40 mg, 40 mg, Oral, Daily, Opyd, Timothy S, MD, 40 mg at 08/11/24 9161   carvedilol  (COREG ) tablet 6.25 mg, 6.25 mg, Oral, BID WC, Opyd, Timothy S, MD, 6.25 mg at 08/10/24 1659   Chlorhexidine  Gluconate Cloth 2 % PADS 6 each, 6 each, Topical, Q0600, Collins, Samantha G, PA-C, 6 each at 08/11/24 9360   Darbepoetin Alfa  (ARANESP ) injection 60 mcg, 60 mcg, Subcutaneous, Q Sat-1800, Coladonato, Fairy, MD, 60 mcg at 08/07/24 1801   hydrALAZINE  (APRESOLINE ) tablet 25 mg, 25 mg, Oral, BID, Opyd, Timothy S, MD, 25 mg at 08/10/24 2205   insulin  aspart  (novoLOG ) injection 0-5 Units, 0-5 Units, Subcutaneous, QHS, Opyd, Timothy S, MD, 3 Units at 08/10/24 2203   insulin  aspart (novoLOG ) injection 0-6 Units, 0-6 Units, Subcutaneous, TID WC, Opyd, Timothy S, MD, 2 Units at 08/10/24 1659   insulin  glargine-yfgn (SEMGLEE ) injection 6 Units, 6 Units, Subcutaneous, Daily, Singh, Prashant K, MD, 6 Units at 08/11/24 0905   isosorbide mononitrate (IMDUR) 24 hr tablet 30 mg, 30 mg, Oral, Daily, Singh, Prashant K, MD, 30 mg at 08/11/24 9161   melatonin tablet 5 mg, 5 mg, Oral, QHS, Akintayo, Musa A, MD, 5 mg at 08/10/24 2205   pantoprazole  (PROTONIX ) EC tablet 40 mg, 40 mg, Oral, Daily, Coladonato, Joseph, MD, 40 mg at 08/11/24 9160   senna (SENOKOT) tablet 8.6 mg, 1 tablet, Oral, Daily PRN, Opyd, Timothy S, MD, 8.6 mg at 08/08/24 0857   sertraline  (ZOLOFT ) tablet 75 mg, 75 mg, Oral, Daily, Mannie Ashley SAILOR, MD, 75 mg at 08/11/24 9161   sodium chloride  flush (NS) 0.9 % injection 3 mL, 3 mL, Intravenous, Q12H, Opyd, Timothy S, MD, 3 mL at 08/11/24 0839   traZODone  (DESYREL ) tablet 50 mg, 50 mg, Oral, QHS, Marque Rademaker N, MD, 50 mg at 08/10/24 2205  Allergies: Allergies  Allergen Reactions   Neurontin  [Gabapentin ] Other (See Comments)    Weakness  Balance impairment     Ashley SAILOR Mannie, MD PGY-1

## 2024-08-11 NOTE — Telephone Encounter (Signed)
 Copied from CRM #[id number]. Topic: Appointments - Appointment Scheduling >> Aug 10, 2024 10:15 AM Myrick T wrote:  Patient/patient representative is calling to schedule an appointment. Refer to attachments for appointment information. Dora called from inpatient care with [facility] to schedule HFU. No appts until mid Jan but Dora needed an appt 14 days from discharge. Please f/u with Dora at [phone] with an appt.

## 2024-08-11 NOTE — Care Management Important Message (Signed)
 Important Message  Patient Details  Name: Stacey Lucas MRN: [id number] Date of Birth: [birth year]   Important Message Given:  Yes - Medicare IM     Claretta Deed 08/11/2024, 3:19 PM

## 2024-08-11 NOTE — Progress Notes (Signed)
  Received patient in bed to unit.   Informed consent signed and in chart.    TX duration:3.5     Transported by  Hand-off given to patient's nurse.    Access used: Rt AVF Access issues: None   Total UF removed: 1900 Medication(s) given: None Post HD VS: 136/59      Hunter Hacking LPN [facility]

## 2024-08-11 NOTE — Telephone Encounter (Signed)
 Contacted patient, unable to reach patient or leave VM.

## 2024-08-11 NOTE — Plan of Care (Signed)
  Problem: Education: Goal: Knowledge of General Education information will improve Description Including pain rating scale, medication(s)/side effects and non-pharmacologic comfort measures Outcome: Progressing   Problem: Clinical Measurements: Goal: Will remain free from infection Outcome: Progressing Goal: Respiratory complications will improve Outcome: Progressing   Problem: Health Behavior/Discharge Planning: Goal: Ability to manage health-related needs will improve Outcome: Not Progressing   

## 2024-08-11 NOTE — Plan of Care (Signed)
  Problem: Fluid Volume: Goal: Ability to maintain a balanced intake and output will improve Outcome: Progressing   Problem: Metabolic: Goal: Ability to maintain appropriate glucose levels will improve Outcome: Progressing   Problem: Nutritional: Goal: Maintenance of adequate nutrition will improve Outcome: Progressing

## 2024-08-12 DIAGNOSIS — Z992 Dependence on renal dialysis: Secondary | ICD-10-CM | POA: Diagnosis not present

## 2024-08-12 DIAGNOSIS — N186 End stage renal disease: Secondary | ICD-10-CM | POA: Diagnosis not present

## 2024-08-12 LAB — GLUCOSE, CAPILLARY
Glucose-Capillary: 138 mg/dL — ABNORMAL HIGH (ref 70–99)
Glucose-Capillary: 185 mg/dL — ABNORMAL HIGH (ref 70–99)
Glucose-Capillary: 210 mg/dL — ABNORMAL HIGH (ref 70–99)
Glucose-Capillary: 227 mg/dL — ABNORMAL HIGH (ref 70–99)

## 2024-08-12 LAB — RENAL FUNCTION PANEL
Albumin: 2.6 g/dL — ABNORMAL LOW (ref 3.5–5.0)
Anion gap: 9 (ref 5–15)
BUN: 27 mg/dL — ABNORMAL HIGH (ref 6–20)
CO2: 32 mmol/L (ref 22–32)
Calcium: 7.8 mg/dL — ABNORMAL LOW (ref 8.9–10.3)
Chloride: 97 mmol/L — ABNORMAL LOW (ref 98–111)
Creatinine, Ser: 4.65 mg/dL — ABNORMAL HIGH (ref 0.44–1.00)
GFR, Estimated: 11 mL/min — ABNORMAL LOW (ref >60)
Glucose, Bld: 100 mg/dL — ABNORMAL HIGH (ref 70–99)
Phosphorus: 4.2 mg/dL (ref 2.5–4.6)
Potassium: 4.3 mmol/L (ref 3.5–5.1)
Sodium: 138 mmol/L (ref 135–145)

## 2024-08-12 NOTE — Patient Instructions (Signed)
 1) Strengthening: Chest Pull - Resisted   Hold Theraband in front of body with hands about shoulder width a part. Pull band a part and back together slowly. Repeat _10-15___ times. Complete _1___ set(s) per session.. Repeat __1__ session(s) per day.  http://orth.exer.us /926   Copyright  VHI. All rights reserved.   2) PNF Strengthening: Resisted   Standing with resistive band around each hand, bring right arm up and away, thumb back. Repeat _10-15___ times per set. Do _1___ sets per session. Do ___1_ sessions per day.   3) Resisted External Rotation: in Neutral - Bilateral   Sit or stand, tubing in both hands, elbows at sides, bent to 90, forearms forward. Pinch shoulder blades together and rotate forearms out. Keep elbows at sides. Repeat _10-15___ times per set. Do __1__ sets per session. Do _1___ sessions per day.  http://orth.exer.us /966   Copyright  VHI. All rights reserved.   4) PNF Strengthening: Resisted   Standing, hold resistive band above head. Bring right arm down and out from side. Repeat _10-15___ times per set. Do _1___ sets per session. Do __1__ sessions per day.  http://orth.exer.us /922   Copyright  VHI. All rights reserved.

## 2024-08-12 NOTE — TOC Progression Note (Signed)
 Transition of Care [facility]) - Progression Note    Patient Details  Name: Stacey Lucas MRN: [id number] Date of Birth: 10-Jul-1976  [facility] CM/SW Contact  Luann SHAUNNA [place], [place] Phone Number: 08/12/2024, 3:28 PM  Clinical Narrative:     [facility], and auth all still pending.   Ashok Shoulder with [facility] is coming to assess pt today.  ICM will continue to follow. Plan for [facility] once the above are all approved.    Expected Discharge Plan: Skilled Nursing Facility Barriers to Discharge: Insurance Authorization, No SNF bed               Expected Discharge Plan and Services In-house Referral: Clinical Social Work   Post Acute Care Choice: NA Living arrangements for the past 2 months: Homeless                                       Social Drivers of Health (SDOH) Interventions SDOH Screenings   Food Insecurity: Food Insecurity Present (08/06/2024)  Housing: High Risk (08/06/2024)  Transportation Needs: Unmet Transportation Needs (08/06/2024)  Utilities: At Risk (08/06/2024)  Alcohol Screen: Low Risk  (04/25/2018)  Depression (PHQ2-9): Low Risk  (02/17/2024)  Financial Resource Strain: Low Risk (06/23/2024)   Received from [facility]  Social Connections: Moderately Integrated (02/17/2024)  Stress: Stress Concern Present (03/20/2023)  Tobacco Use: High Risk (08/06/2024)    Readmission Risk Interventions     No data to display

## 2024-08-12 NOTE — Progress Notes (Addendum)
 PROGRESS NOTE        PATIENT DETAILS Name: Stacey Lucas Age: 48 y.o. Sex: female Date of Birth: [birth year] Admit Date: 08/06/2024 Admitting Physician Evalene GORMAN Sprinkles, MD Lucas, [name], MD  Brief Summary: Patient is a 48 y.o.  female with history of prior CVA with mild right-sided hemiparesis, ESRD on HD, HTN, DM-2-homelessness-who apparently moving to [facility] recently-has not had dialysis in several weeks-presented to the hospital with fatigue/malaise-found to have hyperkalemia.  Significant events: 11/28>> admit to [facility]  Significant studies: 11/28>> CXR: No PNA  Significant microbiology data: None  Procedures: None  Consults: Nephrology, psych  Subjective: Patient in bed, appears comfortable, denies any headache, no fever, no chest pain or pressure, no shortness of breath , no abdominal pain. No focal weakness.  Objective: Vitals: Blood pressure (!) 131/59, pulse 73, temperature 99 F (37.2 C), temperature source Oral, resp. rate 15, height 5' 5 (1.651 m), weight 66.9 kg, last menstrual period 10/11/2019, SpO2 100%.   Exam:  Awake Alert, No new F.N deficits, Normal affect [place].AT,PERRAL Supple Neck, No JVD,   Symmetrical Chest wall movement, Good air movement bilaterally, CTAB RRR,No Gallops, Rubs or new Murmurs,  +ve B.Sounds, Abd Soft, No tenderness,   No Cyanosis, Clubbing or edema   Assessment/Plan:  Hyperkalemia Secondary to missed HD Treated with Lokelma  and then with HD Potassium is normalized.  ESRD on HD Missed several weeks of hemodialysis Unfortunately-patient does not have a dedicated outpatient HD center-she is homeless-and apparently comes to the ED quite a bit for dialysis. Underwent HD yesterday-plans are for HD again today. Will need TOC/diabetic coordinator evaluation prior to discharge.  Normocytic anemia Likely related to ESRD No evidence of blood loss. Aranesp /Iron  defer to nephrology  service.  HTN BP relatively stable Continue amlodipine /Coreg , add low-dose Imdur for better control  HLD Statin  PAF Telemetry monitoring Eliquis   History of CVA with mild right-sided hemiparesis/dysarthria Stable Apparently does use cane/walker Eliquis /statin. Seen by PT OT and will require SNF  Homelessness TOC team following  Suicidal ideation.  Expressed during HD session few days ago.  She has been seen and cleared by psych team, currently no suicidal ideation.  Monitor.    DM-2 (A1c 5.6 on 10/28) CBGs slightly elevated low-dose Lantus  added along with sliding scale.  Monitor and adjust.  Recent Labs    08/11/24 1227 08/11/24 1819 08/11/24 2156  GLUCAP 123* 317* 225*    Lab Results  Component Value Date   HGBA1C 6.5 11/12/2023      Code status:   Code Status: Limited: Do not attempt resuscitation (DNR) -DNR-LIMITED -Do Not Intubate/DNI    DVT Prophylaxis: apixaban  (ELIQUIS ) tablet 2.5 mg Start: 08/06/24 1000 apixaban  (ELIQUIS ) tablet 2.5 mg    Family Communication: None at bedside   Disposition Plan: Status is: Inpatient Remains inpatient appropriate because: Severity of illness   Planned Discharge Destination:Home   Diet: Diet Order             Diet renal with fluid restriction Fluid restriction: 1200 mL Fluid; Room service appropriate? Yes; Fluid consistency: Thin  Diet effective now                     Antimicrobial agents: Anti-infectives (From admission, onward)    None        MEDICATIONS: Scheduled Meds:  amLODipine   10 mg Oral Daily  apixaban   2.5 mg Oral BID   atorvastatin   40 mg Oral Daily   carvedilol   6.25 mg Oral BID WC   Chlorhexidine  Gluconate Cloth  6 each Topical Q0600   darbepoetin (ARANESP ) injection - DIALYSIS  60 mcg Subcutaneous Q Sat-1800   hydrALAZINE   25 mg Oral BID   insulin  aspart  0-5 Units Subcutaneous QHS   insulin  aspart  0-6 Units Subcutaneous TID WC   insulin  glargine-yfgn  6 Units  Subcutaneous Daily   isosorbide mononitrate  30 mg Oral Daily   melatonin  5 mg Oral QHS   pantoprazole   40 mg Oral Daily   sertraline   75 mg Oral Daily   sodium chloride  flush  3 mL Intravenous Q12H   traZODone   50 mg Oral QHS   Continuous Infusions: PRN Meds:.acetaminophen  **OR** acetaminophen , senna   I have personally reviewed following labs and imaging studies  LABORATORY DATA: CBC: Recent Labs  Lab 08/06/24 0434 08/07/24 0535 08/08/24 0338 08/09/24 0728 08/11/24 0512  WBC 9.6 9.9 8.9 9.3 8.4  HGB 9.1* 8.3* 8.3* 7.9* 7.7*  HCT 27.1* 24.8* 25.2* 24.6* 24.1*  MCV 78.8* 79.2* 79.7* 81.2 81.1  PLT 262 223 213 220 202    Basic Metabolic Panel: Recent Labs  Lab 08/07/24 0732 08/08/24 0338 08/09/24 0728 08/11/24 0512 08/12/24 0403  NA 140 135 138 140 138  K 4.8 5.0 5.9* 4.5 4.3  CL 99 96* 98 96* 97*  CO2 23 27 27 30  32  GLUCOSE 149* 141* 146* 87 100*  BUN 78* 45* 62* 57* 27*  CREATININE 11.05* 7.42* 9.05* 7.49* 4.65*  CALCIUM  6.7* 7.1* 7.5* 7.7* 7.8*  PHOS 5.4* 4.4 4.7* 4.8* 4.2    GFR: Estimated Creatinine Clearance: 13.2 mL/min (A) (by C-G formula based on SCr of 4.65 mg/dL (H)).  Liver Function Tests: Recent Labs  Lab 08/07/24 0732 08/08/24 0338 08/09/24 0728 08/11/24 0512 08/12/24 0403  ALBUMIN  2.6* 2.5* 2.7* 2.6* 2.6*   No results for input(s): LIPASE, AMYLASE in the last 168 hours. No results for input(s): AMMONIA in the last 168 hours.  Coagulation Profile: No results for input(s): INR, PROTIME in the last 168 hours.  Cardiac Enzymes: No results for input(s): CKTOTAL, CKMB, CKMBINDEX, TROPONINI in the last 168 hours.  BNP (last 3 results) No results for input(s): PROBNP in the last 8760 hours.  Lipid Profile: No results for input(s): CHOL, HDL, LDLCALC, TRIG, CHOLHDL, LDLDIRECT in the last 72 hours.  Thyroid Function Tests: No results for input(s): TSH, T4TOTAL, FREET4, T3FREE, THYROIDAB in  the last 72 hours.  Anemia Panel: No results for input(s): VITAMINB12, FOLATE, FERRITIN, TIBC, IRON , RETICCTPCT in the last 72 hours.   Urine analysis:    Component Value Date/Time   COLORURINE AMBER (A) 11/14/2022 1902   APPEARANCEUR CLOUDY (A) 11/14/2022 1902   LABSPEC 1.018 11/14/2022 1902   PHURINE 7.0 11/14/2022 1902   GLUCOSEU NEGATIVE 11/14/2022 1902   HGBUR NEGATIVE 11/14/2022 1902   BILIRUBINUR NEGATIVE 11/14/2022 1902   KETONESUR 5 (A) 11/14/2022 1902   PROTEINUR >=300 (A) 11/14/2022 1902   NITRITE NEGATIVE 11/14/2022 1902   LEUKOCYTESUR MODERATE (A) 11/14/2022 1902    Sepsis Labs: Lactic Acid, Venous    Component Value Date/Time   LATICACIDVEN 0.52 12/29/2016 1522    MICROBIOLOGY: Recent Results (from the past 240 hours)  MRSA Next Gen by PCR, Nasal     Status: None   Collection Time: 08/06/24  4:47 AM   Specimen: Nasal Mucosa; Nasal Swab  Result  Value Ref Range Status   MRSA by PCR Next Gen NOT DETECTED NOT DETECTED Final    Comment: (NOTE) The GeneXpert MRSA Assay (FDA approved for NASAL specimens only), is one component of a comprehensive MRSA colonization surveillance program. It is not intended to diagnose MRSA infection nor to guide or monitor treatment for MRSA infections. Test performance is not FDA approved in patients less than 58 years old. Performed at [facility], [address] [place], [place] [address]     RADIOLOGY STUDIES/RESULTS: No results found.    LOS: 6 days   Lavada Stank, MD  [facility]    To contact the attending provider between 7A-7P or the covering provider during after hours 7P-7A, please log into the web site [email or web address] and access using universal [place] password for that web site. If you do not have the password, please call the hospital operator.  08/12/2024, 8:47 AM

## 2024-08-12 NOTE — Progress Notes (Signed)
 OT Cancellation Note  Patient Details Name: Stacey Lucas MRN: [id number] DOB: [birth year]   Cancelled Treatment:    Reason Eval/Treat Not Completed: Other (comment) Pt just received breakfast tray. Will check back with pt at a later time to participate in OT treatment session.   Leita Howell, OTR/L,CBIS  Supplemental OT - [facility] and [facility] Secure Chat Preferred   08/12/2024, 10:03 AM

## 2024-08-12 NOTE — Progress Notes (Signed)
 Contacted [facility] admissions this morning to request an update on pt's referral. Awaiting response. Will assist as needed.   Randine Mungo Dialysis Navigator [phone]

## 2024-08-12 NOTE — Progress Notes (Signed)
 [facility] Progress Note   Subjective:    Completed dialysis yesterday - net UF 1.9L  No complaints this am. No cp, sob  Will be going to SNF   Objective Vitals:   08/11/24 2120 08/11/24 2350 08/12/24 0846 08/12/24 0936  BP: (!) 121/53 (!) 129/56 (!) 131/59 (!) 145/65  Pulse:   73 75  Resp:   15 16  Temp: 99.2 F (37.3 C) 98.6 F (37 C) 99 F (37.2 C)   TempSrc: Oral Oral Oral   SpO2:   100% 100%  Weight:      Height:       Physical Exam General: alert female in NAD Heart: RRR, no murmurs Lungs: CTA bilaterally, respirations unlabored on RA Abdomen: Soft, non-distended, +BS Extremities: No edema b/l lower extremities Dialysis Access: AVF + t/b   Additional Objective Labs: Basic Metabolic Panel: Recent Labs  Lab 08/09/24 0728 08/11/24 0512 08/12/24 0403  NA 138 140 138  K 5.9* 4.5 4.3  CL 98 96* 97*  CO2 27 30 32  GLUCOSE 146* 87 100*  BUN 62* 57* 27*  CREATININE 9.05* 7.49* 4.65*  CALCIUM  7.5* 7.7* 7.8*  PHOS 4.7* 4.8* 4.2   Liver Function Tests: Recent Labs  Lab 08/09/24 0728 08/11/24 0512 08/12/24 0403  ALBUMIN  2.7* 2.6* 2.6*   No results for input(s): LIPASE, AMYLASE in the last 168 hours. CBC: Recent Labs  Lab 08/06/24 0434 08/07/24 0535 08/08/24 0338 08/09/24 0728 08/11/24 0512  WBC 9.6 9.9 8.9 9.3 8.4  HGB 9.1* 8.3* 8.3* 7.9* 7.7*  HCT 27.1* 24.8* 25.2* 24.6* 24.1*  MCV 78.8* 79.2* 79.7* 81.2 81.1  PLT 262 223 213 220 202   Blood Culture    Component Value Date/Time   SDES URINE, RANDOM 11/14/2022 1902   SPECREQUEST  11/14/2022 1902    NONE Reflexed from [id number] Performed at [facility], [address] [place], [place] [address]    CULT >=100,000 COLONIES/mL ESCHERICHIA COLI (A) 11/14/2022 1902   REPTSTATUS 11/17/2022 FINAL 11/14/2022 1902    Cardiac Enzymes: No results for input(s): CKTOTAL, CKMB, CKMBINDEX, TROPONINI in the last 168 hours. CBG: Recent Labs  Lab 08/11/24 0835  08/11/24 1227 08/11/24 1819 08/11/24 2156 08/12/24 0846  GLUCAP 92 123* 317* 225* 138*   Iron  Studies:  No results for input(s): IRON , TIBC, TRANSFERRIN, FERRITIN in the last 72 hours.  @lablastinr3 @ Studies/Results: No results found. Medications:   amLODipine   10 mg Oral Daily   apixaban   2.5 mg Oral BID   atorvastatin   40 mg Oral Daily   carvedilol   6.25 mg Oral BID WC   Chlorhexidine  Gluconate Cloth  6 each Topical Q0600   darbepoetin (ARANESP ) injection - DIALYSIS  60 mcg Subcutaneous Q Sat-1800   hydrALAZINE   25 mg Oral BID   insulin  aspart  0-5 Units Subcutaneous QHS   insulin  aspart  0-6 Units Subcutaneous TID WC   insulin  glargine-yfgn  6 Units Subcutaneous Daily   isosorbide mononitrate  30 mg Oral Daily   melatonin  5 mg Oral QHS   pantoprazole   40 mg Oral Daily   sertraline   75 mg Oral Daily   sodium chloride  flush  3 mL Intravenous Q12H   traZODone   50 mg Oral QHS    Dialysis Orders: previously [facility], no HD unit now   Assessment/Plan: Hyperkalemia: Improved with lokelma  and HD, continue low K diet. Trend labs.  ESRD:  No dialysis for 2 weeks. Resumed HD here on 11/28. Continue  HD MWF. Not sure what her outpatient HD schedule will be yet.  Will need an outpatient HD unit. Renal navigators initiating CLIP. Will be going to SNF. Renal navigators working on placement in [place].   Hypertension/volume: BP elevated, home meds resumed and it's improved, UF with HD  Anemia: Hgb 7-8s. Tsat 63%.  Started aranesp .  Metabolic bone disease: Corr Ca ok. PTH 192. Phos now at goal, continue binders.  Nutrition: Renal diet and fluid restrictions.  MDD. Psych adjusting meds.    Maisie Ronnald Acosta PA-C [facility] 08/12/2024,9:51 AM

## 2024-08-12 NOTE — Progress Notes (Signed)
 Occupational Therapy Treatment Patient Details Name: Stacey Lucas MRN: [id number] DOB: 17-Jan-1976 Today's Date: 08/12/2024   History of present illness 48 yo female presenting to ED requesting HD (missing HD for 2 weeks) pt workup reveals hyperkalemia.  PMH CVA L pons, L basal ganglia, anxiety depression suicidal ideations, DM, HLD HTN migraines cocaine abuse, ESRD on HD, PAF on eliquis ,   OT comments  Pt in bed upon therapy arrival and agreeable to participate in OT treatment session. Focused session on general strengthening while seated EOB in order to increase functional performance during self care tasks. Verbal instructions and visual demonstration provided for proper form and technique. Pt was able to demonstrate understanding with additional VC as needed. Handout provided for UB HEP along with red theraband. Patient will benefit from continued inpatient follow up therapy, <3 hours/day at discharge.        If plan is discharge home, recommend the following:  A little help with walking and/or transfers;A little help with bathing/dressing/bathroom;Assistance with cooking/housework   Equipment Recommendations  None recommended by OT    Recommendations for Other Services      Precautions / Restrictions Precautions Precautions: Fall Recall of Precautions/Restrictions: Intact Restrictions Weight Bearing Restrictions Per Provider Order: No       Mobility Bed Mobility Overal bed mobility: Modified Independent          Balance Overall balance assessment: Needs assistance Sitting-balance support: No upper extremity supported, Feet supported Sitting balance-Leahy Scale: Good Sitting balance - Comments: seated EOB       ADL either performed or assessed with clinical judgement              Communication Communication Communication: No apparent difficulties   Cognition Arousal: Alert Behavior During Therapy: WFL for tasks assessed/performed, Flat affect Cognition: No  apparent impairments     Following commands: Intact        Cueing   Cueing Techniques: Verbal cues  Exercises Total Joint Exercises Towel Squeeze: Strengthening, Both, 10 reps, Seated Hip ABduction/ADduction: Strengthening, Both, 10 reps, Seated (red tband) Knee Flexion: Strengthening, Right, 10 reps, Seated (tband) General Exercises - Upper Extremity Shoulder ABduction: Strengthening, Both, 10 reps, Seated, Theraband Theraband Level (Shoulder Abduction): Level 2 (Red) Shoulder Horizontal ABduction: Strengthening, Both, 10 reps, Seated, Theraband Theraband Level (Shoulder Horizontal Abduction): Level 2 (Red) Other Exercises Other Exercises: knee extension, red tband, 10X, 1 set, seated Other Exercises: IR/er, red tband, 10X, 1 set, seated            Pertinent Vitals/ Pain       Pain Assessment Pain Assessment: No/denies pain         Frequency  Min 2X/week        Progress Toward Goals  OT Goals(current goals can now be found in the care plan section)  Progress towards OT goals: Progressing toward goals            AM-PAC OT 6 Clicks Daily Activity     Outcome Measure   Help from another person eating meals?: None Help from another person taking care of personal grooming?: A Little Help from another person toileting, which includes using toliet, bedpan, or urinal?: A Little Help from another person bathing (including washing, rinsing, drying)?: A Little Help from another person to put on and taking off regular upper body clothing?: A Little Help from another person to put on and taking off regular lower body clothing?: A Little 6 Click Score: 19    End of Session  OT Visit Diagnosis: Unsteadiness on feet (R26.81);Other abnormalities of gait and mobility (R26.89);Muscle weakness (generalized) (M62.81)   Activity Tolerance Patient tolerated treatment well   Patient Left in bed;with call bell/phone within reach;with bed alarm set           Time:  1520-1553 OT Time Calculation (min): 33 min  Charges: OT General Charges $OT Visit: 1 Visit OT Treatments $Therapeutic Exercise: 23-37 mins  Leita Howell, OTR/L,CBIS  Supplemental OT - [facility] and [facility] Secure Chat Preferred    Dionicia Cerritos, Leita BIRCH 08/12/2024, 4:36 PM

## 2024-08-12 NOTE — Plan of Care (Signed)
  Problem: Fluid Volume: Goal: Ability to maintain a balanced intake and output will improve Outcome: Progressing   Problem: Metabolic: Goal: Ability to maintain appropriate glucose levels will improve Outcome: Progressing   Problem: Nutritional: Goal: Maintenance of adequate nutrition will improve Outcome: Progressing

## 2024-08-13 DIAGNOSIS — N186 End stage renal disease: Secondary | ICD-10-CM | POA: Diagnosis not present

## 2024-08-13 DIAGNOSIS — Z992 Dependence on renal dialysis: Secondary | ICD-10-CM | POA: Diagnosis not present

## 2024-08-13 LAB — CBC
HCT: 26 % — ABNORMAL LOW (ref 36.0–46.0)
Hemoglobin: 8.5 g/dL — ABNORMAL LOW (ref 12.0–15.0)
MCH: 26.5 pg (ref 26.0–34.0)
MCHC: 32.7 g/dL (ref 30.0–36.0)
MCV: 81 fL (ref 80.0–100.0)
Platelets: 238 K/uL (ref 150–400)
RBC: 3.21 MIL/uL — ABNORMAL LOW (ref 3.87–5.11)
RDW: 17.2 % — ABNORMAL HIGH (ref 11.5–15.5)
WBC: 7.2 K/uL (ref 4.0–10.5)
nRBC: 0 % (ref 0.0–0.2)

## 2024-08-13 LAB — RENAL FUNCTION PANEL
Albumin: 2.6 g/dL — ABNORMAL LOW (ref 3.5–5.0)
Anion gap: 15 (ref 5–15)
BUN: 44 mg/dL — ABNORMAL HIGH (ref 6–20)
CO2: 29 mmol/L (ref 22–32)
Calcium: 7.9 mg/dL — ABNORMAL LOW (ref 8.9–10.3)
Chloride: 93 mmol/L — ABNORMAL LOW (ref 98–111)
Creatinine, Ser: 6.6 mg/dL — ABNORMAL HIGH (ref 0.44–1.00)
GFR, Estimated: 7 mL/min — ABNORMAL LOW (ref >60)
Glucose, Bld: 126 mg/dL — ABNORMAL HIGH (ref 70–99)
Phosphorus: 5.6 mg/dL — ABNORMAL HIGH (ref 2.5–4.6)
Potassium: 4.7 mmol/L (ref 3.5–5.1)
Sodium: 137 mmol/L (ref 135–145)

## 2024-08-13 LAB — GLUCOSE, CAPILLARY
Glucose-Capillary: 234 mg/dL — ABNORMAL HIGH (ref 70–99)
Glucose-Capillary: 156 mg/dL — ABNORMAL HIGH (ref 70–99)

## 2024-08-13 MED ORDER — HEPARIN SODIUM (PORCINE) 1000 UNIT/ML DIALYSIS
20.0000 [IU]/kg | INTRAMUSCULAR | Status: DC | PRN
  Administered 2024-08-13: 1300 [IU] via INTRAVENOUS_CENTRAL

## 2024-08-13 MED ORDER — LIDOCAINE-PRILOCAINE 2.5-2.5 % EX CREA: 1.0000 | TOPICAL_CREAM | CUTANEOUS | Status: DC | PRN

## 2024-08-13 MED ORDER — HEPARIN SODIUM (PORCINE) 1000 UNIT/ML DIALYSIS: 1000.0000 [IU] | INTRAMUSCULAR | Status: DC | PRN

## 2024-08-13 MED ORDER — ACETAMINOPHEN 325 MG PO TABS
ORAL_TABLET | ORAL | Status: AC
  Filled 2024-08-13: qty 2

## 2024-08-13 MED ORDER — ALTEPLASE 2 MG IJ SOLR: 2.0000 mg | Freq: Once | INTRAMUSCULAR | Status: DC | PRN

## 2024-08-13 MED ORDER — HEPARIN SODIUM (PORCINE) 1000 UNIT/ML IJ SOLN
INTRAMUSCULAR | Status: AC
  Filled 2024-08-13: qty 2

## 2024-08-13 MED ORDER — INSULIN GLARGINE-YFGN 100 UNIT/ML ~~LOC~~ SOLN: 8.0000 [IU] | Freq: Every day | SUBCUTANEOUS | Status: DC

## 2024-08-13 MED ORDER — PENTAFLUOROPROP-TETRAFLUOROETH EX AERO: 1.0000 | INHALATION_SPRAY | CUTANEOUS | Status: DC | PRN

## 2024-08-13 MED ORDER — LIDOCAINE HCL (PF) 1 % IJ SOLN: 5.0000 mL | INTRAMUSCULAR | Status: DC | PRN

## 2024-08-13 MED ORDER — INSULIN GLARGINE 100 UNIT/ML ~~LOC~~ SOLN
8.0000 [IU] | Freq: Every day | SUBCUTANEOUS | Status: DC
  Administered 2024-08-14: 8 [IU] via SUBCUTANEOUS
  Filled 2024-08-13: qty 0.08

## 2024-08-13 MED ORDER — ANTICOAGULANT SODIUM CITRATE 4% (200MG/5ML) IV SOLN: 5.0000 mL | Status: DC | PRN

## 2024-08-13 NOTE — Progress Notes (Signed)
 [facility] Progress Note   Subjective:    Seen on HD this AM.  Tolerating tx.  No complaints this am. No cp, sob  Will be going to SNF   Objective Vitals:   08/13/24 0830 08/13/24 0840 08/13/24 0900 08/13/24 0930  BP: (!) 142/67 (!) 140/68 (!) 156/59 (!) 145/61  Pulse: 77  70 76  Resp: 14 (!) 7 (!) 8 16  Temp:      TempSrc:      SpO2: 100% 100% 100% 95%  Weight:      Height:       Physical Exam General: alert female in NAD Heart: RRR, no murmurs Lungs: CTA bilaterally, respirations unlabored on RA Abdomen: Soft, non-distended, +BS Extremities: No edema b/l lower extremities Dialysis Access: AVF + t/b   Additional Objective Labs: Basic Metabolic Panel: Recent Labs  Lab 08/11/24 0512 08/12/24 0403 08/13/24 0446  NA 140 138 137  K 4.5 4.3 4.7  CL 96* 97* 93*  CO2 30 32 29  GLUCOSE 87 100* 126*  BUN 57* 27* 44*  CREATININE 7.49* 4.65* 6.60*  CALCIUM  7.7* 7.8* 7.9*  PHOS 4.8* 4.2 5.6*   Liver Function Tests: Recent Labs  Lab 08/11/24 0512 08/12/24 0403 08/13/24 0446  ALBUMIN  2.6* 2.6* 2.6*   No results for input(s): LIPASE, AMYLASE in the last 168 hours. CBC: Recent Labs  Lab 08/07/24 0535 08/08/24 0338 08/09/24 0728 08/11/24 0512  WBC 9.9 8.9 9.3 8.4  HGB 8.3* 8.3* 7.9* 7.7*  HCT 24.8* 25.2* 24.6* 24.1*  MCV 79.2* 79.7* 81.2 81.1  PLT 223 213 220 202   Blood Culture    Component Value Date/Time   SDES URINE, RANDOM 11/14/2022 1902   SPECREQUEST  11/14/2022 1902    NONE Reflexed from [id number] Performed at [facility], [address] [place], [place] [address]    CULT >=100,000 COLONIES/mL ESCHERICHIA COLI (A) 11/14/2022 1902   REPTSTATUS 11/17/2022 FINAL 11/14/2022 1902    Cardiac Enzymes: No results for input(s): CKTOTAL, CKMB, CKMBINDEX, TROPONINI in the last 168 hours. CBG: Recent Labs  Lab 08/11/24 2156 08/12/24 0846 08/12/24 1157 08/12/24 1622 08/12/24 2102  GLUCAP 225* 138* 185* 210* 227*    Iron  Studies:  No results for input(s): IRON , TIBC, TRANSFERRIN, FERRITIN in the last 72 hours.  @lablastinr3 @ Studies/Results: No results found. Medications:  anticoagulant sodium citrate       amLODipine   10 mg Oral Daily   apixaban   2.5 mg Oral BID   atorvastatin   40 mg Oral Daily   carvedilol   6.25 mg Oral BID WC   Chlorhexidine  Gluconate Cloth  6 each Topical Q0600   darbepoetin (ARANESP ) injection - DIALYSIS  60 mcg Subcutaneous Q Sat-1800   hydrALAZINE   25 mg Oral BID   insulin  aspart  0-5 Units Subcutaneous QHS   insulin  aspart  0-6 Units Subcutaneous TID WC   insulin  glargine-yfgn  6 Units Subcutaneous Daily   isosorbide  mononitrate  30 mg Oral Daily   melatonin  5 mg Oral QHS   pantoprazole   40 mg Oral Daily   sertraline   75 mg Oral Daily   sodium chloride  flush  3 mL Intravenous Q12H   traZODone   50 mg Oral QHS    Dialysis Orders: previously [facility] MWF, no HD unit now   Assessment/Plan:  ESRD:  No dialysis for 2 weeks. Resumed HD here on 11/28. Continue HD MWF. Not sure what her outpatient HD schedule will be yet.  Will need an outpatient HD  unit. Renal navigators initiating CLIP. Will be going to SNF. Renal navigators working on placement in [place].   Hypertension/volume: BP elevated, home meds resumed and it's improved, UF with HD  Anemia: Hgb 7-8s. Tsat 63%.  Started aranesp .  Metabolic bone disease: Corr Ca ok. PTH 192. Phos now at goal, continue binders.  Nutrition: Renal diet and fluid restrictions.  MDD. Psych adjusting meds. She says she's doing better.   Manuelita Barters MD [facility] Pager [phone]

## 2024-08-13 NOTE — Progress Notes (Signed)
 Physical Therapy Treatment Patient Details Name: Stacey Lucas MRN: [id number] DOB: 10-30-1975 Today's Date: 08/13/2024   History of Present Illness 48 yo female presenting to ED requesting HD (missing HD for 2 weeks) pt workup reveals hyperkalemia.  PMH CVA L pons, L basal ganglia, anxiety depression suicidal ideations, DM, HLD HTN migraines cocaine abuse, ESRD on HD, PAF on eliquis .    PT Comments  Pt received lying in bed with daughter present. Pt reports during session that she does not primarily use the wheelchair at home and ambulates short distances with assist from family. Pt reports not using DME and has had multiple falls. Session focused on DME training with rolling walker and ambulating. Pt benefits from one seated rest break due to fatigue and pain while ambulating. Pt demonstrates appropriate use of walker without knee buckling noted. R knee hyperextension noted in stance and pt is unable to correct despite verbal cues. Pt would benefit from continued PT services focused on strengthening, balance, and gait training with appropriate DME to promote improved functional mobility.     If plan is discharge home, recommend the following: A little help with walking and/or transfers;A little help with bathing/dressing/bathroom;Assist for transportation;Help with stairs or ramp for entrance   Can travel by private vehicle     Yes  Equipment Recommendations  Rolling walker (2 wheels);Wheelchair (measurements PT);Wheelchair cushion (measurements PT)    Recommendations for Other Services  None     Precautions / Restrictions Precautions Precautions: Fall Restrictions Weight Bearing Restrictions Per Provider Order: No     Mobility  Bed Mobility Overal bed mobility: Modified Independent (using bed rails as needed) Bed Mobility: Supine to Sit     Supine to sit: Modified independent (Device/Increase time)     General bed mobility comments: No difficulty noted with bed mobility     Transfers Overall transfer level: Needs assistance Equipment used: Rolling walker (2 wheels) Transfers: Sit to/from Stand Sit to Stand: Mod assist           General transfer comment: Pt with difficulty initiating stance, benefits from use of arm rests, cued for nose over toes technique. Fatgiues with mulitple reps.    Ambulation/Gait Ambulation/Gait assistance: Min assist Gait Distance (Feet): 70 Feet (x2 bouts of 24ft each) Assistive device: Rolling walker (2 wheels) Gait Pattern/deviations: Step-through pattern, Decreased step length - right, Decreased step length - left, Decreased stance time - right, Decreased dorsiflexion - right, Decreased dorsiflexion - left, Decreased weight shift to right, Knee hyperextension - right   Gait velocity interpretation: <1.8 ft/sec, indicate of risk for recurrent falls   General Gait Details: R LE Hyperextension noted during swing through phase of L LE. No knee buckling noted. Pt demonstrates improved balance and stepping technique with use of walker.   Stairs             Wheelchair Mobility     Tilt Bed    Modified Rankin (Stroke Patients Only)       Balance Overall balance assessment: Needs assistance Sitting-balance support: Single extremity supported Sitting balance-Leahy Scale: Good Sitting balance - Comments: Pt demonstrates good trunk control while seated EOB Postural control: Left lateral lean (in standing due to R LE weakness) Standing balance support: Bilateral upper extremity supported Standing balance-Leahy Scale: Fair Standing balance comment: Benefits from use of rolling walker in stance, R LE fatigues quickly. No overt LOB with mobility.  Communication Communication Communication: No apparent difficulties  Cognition Arousal: Alert Behavior During Therapy: WFL for tasks assessed/performed   PT - Cognitive impairments: Safety/Judgement, History of cognitive  impairments                         Following commands: Intact      Cueing Cueing Techniques: Verbal cues, Tactile cues, Visual cues  Exercises      General Comments General comments (skin integrity, edema, etc.): VSS throughout. No significant skin abormalities noted.      Pertinent Vitals/Pain Pain Assessment Pain Assessment: 0-10 Pain Score: 6  Pain Location: R knee (from recent fall prior to admission) Pain Descriptors / Indicators: Aching, Sore Pain Intervention(s): Monitored during session, Repositioned    Home Living                          Prior Function            PT Goals (current goals can now be found in the care plan section) Acute Rehab PT Goals Patient Stated Goal: Decrease R knee pain PT Goal Formulation: With patient Time For Goal Achievement: 08/27/24 Potential to Achieve Goals: Good Progress towards PT goals: Progressing toward goals    Frequency    Min 1X/week      PT Plan      Co-evaluation              AM-PAC PT 6 Clicks Mobility   Outcome Measure  Help needed turning from your back to your side while in a flat bed without using bedrails?: None Help needed moving from lying on your back to sitting on the side of a flat bed without using bedrails?: None Help needed moving to and from a bed to a chair (including a wheelchair)?: A Lot Help needed standing up from a chair using your arms (e.g., wheelchair or bedside chair)?: A Lot Help needed to walk in hospital room?: A Little Help needed climbing 3-5 steps with a railing? : A Lot 6 Click Score: 17    End of Session Equipment Utilized During Treatment: Gait belt Activity Tolerance: Patient tolerated treatment well;Patient limited by fatigue;Patient limited by pain Patient left: in chair;with call bell/phone within reach;with chair alarm set;with family/visitor present Nurse Communication: Mobility status PT Visit Diagnosis: Unsteadiness on feet  (R26.81);Other abnormalities of gait and mobility (R26.89);Muscle weakness (generalized) (M62.81)     Time: 8486-8463 PT Time Calculation (min) (ACUTE ONLY): 23 min  Charges:    $Gait Training: 23-37 mins PT General Charges $$ ACUTE PT VISIT: 1 Visit                     Stacey Lucas, PT, [name]  Acute Rehabilitation Services         Office: [phone]    Stacey Lucas 08/13/2024, 4:16 PM

## 2024-08-13 NOTE — Progress Notes (Signed)
 PROGRESS NOTE        PATIENT DETAILS Name: Stacey Lucas Age: 48 y.o. Sex: female Date of Birth: 04/02/1976 Admit Date: 08/06/2024 Admitting Physician Evalene GORMAN Sprinkles, MD ERE:Wztopw, [name], MD  Brief Summary: Patient is a 48 y.o.  female with history of prior CVA with mild right-sided hemiparesis, ESRD on HD, HTN, DM-2-homelessness-who apparently moving to [facility] recently-has not had dialysis in several weeks-presented to the hospital with fatigue/malaise-found to have hyperkalemia.  Significant events: 11/28>> admit to [facility]  Significant studies: 11/28>> CXR: No PNA  Significant microbiology data: None  Procedures: None  Consults: Nephrology, psych  Subjective:  Patient in bed, appears comfortable, denies any headache, no fever, no chest pain or pressure, no shortness of breath , no abdominal pain. No new focal weakness.   Objective: Vitals: Blood pressure 129/71, pulse 79, temperature 98.6 F (37 C), temperature source Oral, resp. rate 14, height 5' 5 (1.651 m), weight 63.4 kg, last menstrual period 10/11/2019, SpO2 100%.   Exam:  Awake Alert, No new F.N deficits, Normal affect [place].AT,PERRAL Supple Neck, No JVD,   Symmetrical Chest wall movement, Good air movement bilaterally, CTAB RRR,No Gallops, Rubs or new Murmurs,  +ve B.Sounds, Abd Soft, No tenderness,   No Cyanosis, Clubbing or edema   Assessment/Plan:  Hyperkalemia Secondary to missed HD Treated with Lokelma  and then with HD Potassium is normalized.  ESRD on HD Missed several weeks of hemodialysis Unfortunately-patient does not have a dedicated outpatient HD center-she is homeless-and apparently comes to the ED quite a bit for dialysis. Underwent HD yesterday-plans are for HD again today. Will need TOC/diabetic coordinator evaluation prior to discharge.  Normocytic anemia Likely related to ESRD No evidence of blood loss. Aranesp /Iron  defer to nephrology  service.  HTN BP relatively stable Continue amlodipine /Coreg , add low-dose Imdur  for better control  HLD Statin  PAF Telemetry monitoring Eliquis   History of CVA with mild right-sided hemiparesis/dysarthria Stable Apparently does use cane/walker Eliquis /statin. Seen by PT OT and will require SNF  Homelessness TOC team following  Suicidal ideation.  Expressed during HD session few days ago.  She has been seen and cleared by psych team, currently no suicidal ideation.  Monitor.    DM-2 (A1c 5.6 on 10/28) CBGs slightly elevated low-dose Lantus  added along with sliding scale.  Monitor and adjust.  Recent Labs    08/12/24 1157 08/12/24 1622 08/12/24 2102  GLUCAP 185* 210* 227*    Lab Results  Component Value Date   HGBA1C 6.5 11/12/2023      Code status:   Code Status: Limited: Do not attempt resuscitation (DNR) -DNR-LIMITED -Do Not Intubate/DNI    DVT Prophylaxis: apixaban  (ELIQUIS ) tablet 2.5 mg Start: 08/06/24 1000 apixaban  (ELIQUIS ) tablet 2.5 mg    Family Communication: None at bedside   Disposition Plan: Status is: Inpatient Remains inpatient appropriate because: Severity of illness   Planned Discharge Destination:Home   Diet: Diet Order             Diet renal with fluid restriction Fluid restriction: 1200 mL Fluid; Room service appropriate? Yes; Fluid consistency: Thin  Diet effective now                     Antimicrobial agents: Anti-infectives (From admission, onward)    None        MEDICATIONS: Scheduled Meds:  amLODipine   10 mg Oral  Daily   apixaban   2.5 mg Oral BID   atorvastatin   40 mg Oral Daily   carvedilol   6.25 mg Oral BID WC   Chlorhexidine  Gluconate Cloth  6 each Topical Q0600   darbepoetin (ARANESP ) injection - DIALYSIS  60 mcg Subcutaneous Q Sat-1800   hydrALAZINE   25 mg Oral BID   insulin  aspart  0-5 Units Subcutaneous QHS   insulin  aspart  0-6 Units Subcutaneous TID WC   insulin  glargine-yfgn  6 Units  Subcutaneous Daily   isosorbide  mononitrate  30 mg Oral Daily   melatonin  5 mg Oral QHS   pantoprazole   40 mg Oral Daily   sertraline   75 mg Oral Daily   sodium chloride  flush  3 mL Intravenous Q12H   traZODone   50 mg Oral QHS   Continuous Infusions:  anticoagulant sodium citrate      PRN Meds:.acetaminophen  **OR** acetaminophen , alteplase , anticoagulant sodium citrate , heparin , heparin , lidocaine  (PF), lidocaine -prilocaine , pentafluoroprop-tetrafluoroeth, senna   I have personally reviewed following labs and imaging studies  LABORATORY DATA: CBC: Recent Labs  Lab 08/07/24 0535 08/08/24 0338 08/09/24 0728 08/11/24 0512  WBC 9.9 8.9 9.3 8.4  HGB 8.3* 8.3* 7.9* 7.7*  HCT 24.8* 25.2* 24.6* 24.1*  MCV 79.2* 79.7* 81.2 81.1  PLT 223 213 220 202    Basic Metabolic Panel: Recent Labs  Lab 08/08/24 0338 08/09/24 0728 08/11/24 0512 08/12/24 0403 08/13/24 0446  NA 135 138 140 138 137  K 5.0 5.9* 4.5 4.3 4.7  CL 96* 98 96* 97* 93*  CO2 27 27 30  32 29  GLUCOSE 141* 146* 87 100* 126*  BUN 45* 62* 57* 27* 44*  CREATININE 7.42* 9.05* 7.49* 4.65* 6.60*  CALCIUM  7.1* 7.5* 7.7* 7.8* 7.9*  PHOS 4.4 4.7* 4.8* 4.2 5.6*    GFR: Estimated Creatinine Clearance: 9.3 mL/min (A) (by C-G formula based on SCr of 6.6 mg/dL (H)).  Liver Function Tests: Recent Labs  Lab 08/08/24 0338 08/09/24 0728 08/11/24 0512 08/12/24 0403 08/13/24 0446  ALBUMIN  2.5* 2.7* 2.6* 2.6* 2.6*   No results for input(s): LIPASE, AMYLASE in the last 168 hours. No results for input(s): AMMONIA in the last 168 hours.  Coagulation Profile: No results for input(s): INR, PROTIME in the last 168 hours.  Cardiac Enzymes: No results for input(s): CKTOTAL, CKMB, CKMBINDEX, TROPONINI in the last 168 hours.  BNP (last 3 results) No results for input(s): PROBNP in the last 8760 hours.  Lipid Profile: No results for input(s): CHOL, HDL, LDLCALC, TRIG, CHOLHDL, LDLDIRECT in the  last 72 hours.  Thyroid Function Tests: No results for input(s): TSH, T4TOTAL, FREET4, T3FREE, THYROIDAB in the last 72 hours.  Anemia Panel: No results for input(s): VITAMINB12, FOLATE, FERRITIN, TIBC, IRON , RETICCTPCT in the last 72 hours.   Urine analysis:    Component Value Date/Time   COLORURINE AMBER (A) 11/14/2022 1902   APPEARANCEUR CLOUDY (A) 11/14/2022 1902   LABSPEC 1.018 11/14/2022 1902   PHURINE 7.0 11/14/2022 1902   GLUCOSEU NEGATIVE 11/14/2022 1902   HGBUR NEGATIVE 11/14/2022 1902   BILIRUBINUR NEGATIVE 11/14/2022 1902   KETONESUR 5 (A) 11/14/2022 1902   PROTEINUR >=300 (A) 11/14/2022 1902   NITRITE NEGATIVE 11/14/2022 1902   LEUKOCYTESUR MODERATE (A) 11/14/2022 1902    Sepsis Labs: Lactic Acid, Venous    Component Value Date/Time   LATICACIDVEN 0.52 12/29/2016 1522    MICROBIOLOGY: Recent Results (from the past 240 hours)  MRSA Next Gen by PCR, Nasal     Status: None   Collection  Time: 08/06/24  4:47 AM   Specimen: Nasal Mucosa; Nasal Swab  Result Value Ref Range Status   MRSA by PCR Next Gen NOT DETECTED NOT DETECTED Final    Comment: (NOTE) The GeneXpert MRSA Assay (FDA approved for NASAL specimens only), is one component of a comprehensive MRSA colonization surveillance program. It is not intended to diagnose MRSA infection nor to guide or monitor treatment for MRSA infections. Test performance is not FDA approved in patients less than 55 years old. Performed at [facility], [address] [place], [place] [address]     RADIOLOGY STUDIES/RESULTS: No results found.    LOS: 7 days   Lavada Stank, MD  [facility]    To contact the attending provider between 7A-7P or the covering provider during after hours 7P-7A, please log into the web site [email or web address] and access using universal Cloud password for that web site. If you do not have the password, please call the hospital operator.  08/13/2024,  7:56 AM

## 2024-08-13 NOTE — TOC Progression Note (Signed)
 Transition of Care [facility]) - Progression Note    Patient Details  Name: Stacey Lucas MRN: [id number] Date of Birth: 20-Mar-1976  Transition of Care [facility]) CM/SW Contact  Luann SHAUNNA [place], [place] Phone Number: 08/13/2024, 11:42 AM  Clinical Narrative:     PASRR approved: [id number] C  Auth approved 12/4-12/8 Auth# [id number]  OPHD still pending.   Expected Discharge Plan: Skilled Nursing Facility Barriers to Discharge: OPHD               Expected Discharge Plan and Services In-house Referral: Clinical Social Work   Post Acute Care Choice: NA Living arrangements for the past 2 months: Homeless                                       Social Drivers of Health (SDOH) Interventions SDOH Screenings   Food Insecurity: Food Insecurity Present (08/06/2024)  Housing: High Risk (08/06/2024)  Transportation Needs: Unmet Transportation Needs (08/06/2024)  Utilities: At Risk (08/06/2024)  Alcohol Screen: Low Risk  (04/25/2018)  Depression (PHQ2-9): Low Risk  (02/17/2024)  Financial Resource Strain: Low Risk (06/23/2024)   Received from [facility]  Social Connections: Moderately Integrated (02/17/2024)  Stress: Stress Concern Present (03/20/2023)  Tobacco Use: High Risk (08/06/2024)    Readmission Risk Interventions     No data to display

## 2024-08-13 NOTE — Plan of Care (Signed)
  Problem: Fluid Volume: Goal: Ability to maintain a balanced intake and output will improve Outcome: Progressing   Problem: Health Behavior/Discharge Planning: Goal: Ability to manage health-related needs will improve Outcome: Progressing   Problem: Nutritional: Goal: Progress toward achieving an optimal weight will improve Outcome: Progressing   Problem: Clinical Measurements: Goal: Ability to maintain clinical measurements within normal limits will improve Outcome: Progressing Goal: Cardiovascular complication will be avoided Outcome: Progressing   Problem: Activity: Goal: Risk for activity intolerance will decrease Outcome: Progressing

## 2024-08-13 NOTE — Progress Notes (Signed)
 Pt has been accepted at [facility] on TTS 11:15 am chair time. Pt can start on Tuesday and will need to arrive at 10:30 am for paperwork. Update provided to CSW, nephrologist, and renal PA. CSW confirms snf can accommodate clinic and schedule. HD arrangements placed on pt's AVS and renal PA to send orders to clinic at d/c. Will assist as needed.   Randine Mungo Dialysis Navigator [phone]

## 2024-08-13 NOTE — Plan of Care (Signed)
   Problem: Coping: Goal: Ability to adjust to condition or change in health will improve Outcome: Progressing

## 2024-08-14 DIAGNOSIS — Z992 Dependence on renal dialysis: Secondary | ICD-10-CM | POA: Diagnosis not present

## 2024-08-14 DIAGNOSIS — N186 End stage renal disease: Secondary | ICD-10-CM | POA: Diagnosis not present

## 2024-08-14 LAB — RENAL FUNCTION PANEL
Albumin: 2.9 g/dL — ABNORMAL LOW (ref 3.5–5.0)
Anion gap: 10 (ref 5–15)
BUN: 31 mg/dL — ABNORMAL HIGH (ref 6–20)
CO2: 31 mmol/L (ref 22–32)
Calcium: 7.7 mg/dL — ABNORMAL LOW (ref 8.9–10.3)
Chloride: 97 mmol/L — ABNORMAL LOW (ref 98–111)
Creatinine, Ser: 5.07 mg/dL — ABNORMAL HIGH (ref 0.44–1.00)
GFR, Estimated: 10 mL/min — ABNORMAL LOW (ref >60)
Glucose, Bld: 100 mg/dL — ABNORMAL HIGH (ref 70–99)
Phosphorus: 5.1 mg/dL — ABNORMAL HIGH (ref 2.5–4.6)
Potassium: 4.2 mmol/L (ref 3.5–5.1)
Sodium: 138 mmol/L (ref 135–145)

## 2024-08-14 LAB — GLUCOSE, CAPILLARY: Glucose-Capillary: 121 mg/dL — ABNORMAL HIGH (ref 70–99)

## 2024-08-14 MED ORDER — PANTOPRAZOLE SODIUM 40 MG PO TBEC: 40.0000 mg | DELAYED_RELEASE_TABLET | Freq: Every day | ORAL | Status: DC

## 2024-08-14 MED ORDER — INSULIN ASPART 100 UNIT/ML FLEXPEN: PEN_INJECTOR | SUBCUTANEOUS | Status: DC

## 2024-08-14 MED ORDER — INSULIN GLARGINE 100 UNIT/ML ~~LOC~~ SOLN: 8.0000 [IU] | Freq: Every day | SUBCUTANEOUS | Status: AC

## 2024-08-14 MED ORDER — SERTRALINE HCL 25 MG PO TABS: 75.0000 mg | ORAL_TABLET | Freq: Every day | ORAL | Status: DC

## 2024-08-14 MED ORDER — TRAZODONE HCL 50 MG PO TABS: 50.0000 mg | ORAL_TABLET | Freq: Every day | ORAL | Status: DC

## 2024-08-14 MED ORDER — ISOSORBIDE MONONITRATE ER 30 MG PO TB24: 30.0000 mg | ORAL_TABLET | Freq: Every day | ORAL | Status: AC

## 2024-08-14 NOTE — Progress Notes (Signed)
 AVS completed for discharge packet and placed with chart. Removed IV, site clean, dry, and intact.  CCMD notified.

## 2024-08-14 NOTE — TOC Transition Note (Signed)
 Transition of Care [facility]) - Discharge Note   Patient Details  Name: Stacey Lucas MRN: [id number] Date of Birth: 1976/01/23  [facility]) CM/SW Contact:  Cena Ligas, LCSW Phone Number: 08/14/2024, 11:35 AM   Clinical Narrative:     Per MD patient ready for DC to [facility]. RN, patient, patient's family, and facility notified of DC. Discharge Summary and FL2 sent to facility. DC packet on chart. Insurance shara has been received.  Ambulance transport requested for patient.    RN to call report to [phone]  CSW will sign off for now as social work intervention is no longer needed. Please consult us  again if new needs arise.   Final next level of care: Skilled Nursing Facility Barriers to Discharge: Barriers Resolved   Patient Goals and CMS Choice Patient states their goals for this hospitalization and ongoing recovery are:: Undetermined   Choice offered to / list presented to : NA      Discharge Placement              Patient chooses bed at: Other - please specify in the comment section below: [facility]) Patient to be transferred to facility by: Ptar Name of family member notified: patient is x4 Patient and family notified of of transfer: 08/14/24  Discharge Plan and Services Additional resources added to the After Visit Summary for   In-house Referral: Clinical Social Work   Post Acute Care Choice: NA                               Social Drivers of Health (SDOH) Interventions SDOH Screenings   Food Insecurity: Food Insecurity Present (08/06/2024)  Housing: High Risk (08/06/2024)  Transportation Needs: Unmet Transportation Needs (08/06/2024)  Utilities: At Risk (08/06/2024)  Alcohol Screen: Low Risk  (04/25/2018)  Depression (PHQ2-9): Low Risk  (02/17/2024)  Financial Resource Strain: Low Risk (06/23/2024)   Received from [facility]  Social Connections: Moderately Integrated (02/17/2024)  Stress: Stress Concern Present  (03/20/2023)  Tobacco Use: High Risk (08/06/2024)     Readmission Risk Interventions     No data to display

## 2024-08-14 NOTE — Progress Notes (Signed)
 [facility] Progress Note   Subjective:   Patient seen and examined in room.  Feeling better.  Tolerated dialysis well yesterday.  Denies chest pain, shortness of breath, nausea, vomiting, weakness and fatigue.  Plans to d/c home today.    Objective Vitals:   08/13/24 1242 08/13/24 1347 08/13/24 1619 08/14/24 0738  BP: (!) 131/53 (!) 147/63 (!) 114/55 (!) 168/65  Pulse: 80 89 93   Resp: 16  16 15   Temp: 98.6 F (37 C)  98.4 F (36.9 C) 98.4 F (36.9 C)  TempSrc: Oral  Oral Oral  SpO2: 100%     Weight:      Height:       Physical Exam General: well appearing female in NAD Heart:RRR Lungs:CTAB, nml WOB on RA Abdomen:soft, NTND Extremities:no LE edema Dialysis Access: AVF +b/t   Filed Weights   08/09/24 0718 08/13/24 0754 08/13/24 1211  Weight: 66.9 kg 63.4 kg 61.9 kg    Intake/Output Summary (Last 24 hours) at 08/14/2024 0910 Last data filed at 08/13/2024 1242 Gross per 24 hour  Intake --  Output 3400 ml  Net -3400 ml    Additional Objective Labs: Basic Metabolic Panel: Recent Labs  Lab 08/12/24 0403 08/13/24 0446 08/14/24 0458  NA 138 137 138  K 4.3 4.7 4.2  CL 97* 93* 97*  CO2 32 29 31  GLUCOSE 100* 126* 100*  BUN 27* 44* 31*  CREATININE 4.65* 6.60* 5.07*  CALCIUM  7.8* 7.9* 7.7*  PHOS 4.2 5.6* 5.1*   Liver Function Tests: Recent Labs  Lab 08/12/24 0403 08/13/24 0446 08/14/24 0458  ALBUMIN  2.6* 2.6* 2.9*   CBC: Recent Labs  Lab 08/08/24 0338 08/09/24 0728 08/11/24 0512 08/13/24 1444  WBC 8.9 9.3 8.4 7.2  HGB 8.3* 7.9* 7.7* 8.5*  HCT 25.2* 24.6* 24.1* 26.0*  MCV 79.7* 81.2 81.1 81.0  PLT 213 220 202 238   Medications:   amLODipine   10 mg Oral Daily   apixaban   2.5 mg Oral BID   atorvastatin   40 mg Oral Daily   carvedilol   6.25 mg Oral BID WC   Chlorhexidine  Gluconate Cloth  6 each Topical Q0600   darbepoetin (ARANESP ) injection - DIALYSIS  60 mcg Subcutaneous Q Sat-1800   hydrALAZINE   25 mg Oral BID   insulin  aspart   0-5 Units Subcutaneous QHS   insulin  aspart  0-6 Units Subcutaneous TID WC   insulin  glargine  8 Units Subcutaneous Daily   isosorbide  mononitrate  30 mg Oral Daily   melatonin  5 mg Oral QHS   pantoprazole   40 mg Oral Daily   sertraline   75 mg Oral Daily   sodium chloride  flush  3 mL Intravenous Q12H   traZODone   50 mg Oral QHS    Dialysis Orders: previously [facility] MWF Now accepted on TTS shift at [facility]   Assessment/Plan:   ESRD:  No dialysis for 2 weeks. Resumed HD here on 11/28. Has been running on MWF.  Last HD yesterday. Now accepted at [facility] on TTS shift.  Will send orders.  HD yesterday.  DOes not appear volume overloaded.  Labs stable. Next HD on 08/17/24.  Limit fluids and potassium.  Hypertension/volume: BP variable.  Elevated this AM - meds just given. Continue home meds. Does not appear volume overloaded.  Set EDW ~62kg Anemia: Hgb 7-8s. Tsat 63%.  Aranesp  60mcg qSat started, not yet given today. Metabolic bone disease: Corr Ca ok. PTH 192. Phos now at goal, continue binders.  Nutrition: Renal diet and fluid restrictions.  MDD. Psych adjusting meds. She says she's doing better.   Dispo - plan to d/c to SNF today.  Manuelita Labella, PA-C [facility] 08/14/2024,9:10 AM  LOS: 8 days

## 2024-08-14 NOTE — Discharge Planning (Signed)
 [facility]  Initial Hemodialysis Orders  D[facility] center[facility]  Patient's name: Stacey Lucas DOB: 1976-04-20  Transfer from [facility]  Discharge diagnosis: ESRD - non compliance d/t moving without having established HD clinic 2.   Hyperkalemia 3.   Depression/SI - cleared by psych, to follow up with them in 1-2 weeks 4.   Homeless 5.   D/c to SNF   Allergies:  Allergies  Allergen Reactions   Neurontin  [Gabapentin ] Other (See Comments)    Weakness  Balance impairment     Dialysis Prescription: Dialysis Frequency: TTS Tx duration: 4 BFR: 400 DFR: 800 EDW: 62  Dialyzer: 180NRe UF profile/Sodium modeling?: no Dialysis Bath: 2 K 2.5 Ca  Dialysis access: Access type: LU AVF  Needle gauge: 15  In Center Medications: Heparin  Dose: none   VDRA: none Venofer : none Mircera: 50 mcg q2wks Next dose due: 08/17/24 Sensipar: no ONSP: yes  Discharge labs: Hgb: 8.5 K+: 4.2  Ca: 7.7  Phos: 5.1 Alb: 2.9  Please draw routine labs. Additional labs needed: Monthly labs 1st HD Additional notes/follow-up:   Completed By: Manuelita Labella PA-C

## 2024-08-14 NOTE — Plan of Care (Signed)
  Problem: Nutritional: Goal: Maintenance of adequate nutrition will improve Outcome: Progressing   Problem: Skin Integrity: Goal: Risk for impaired skin integrity will decrease Outcome: Progressing   Problem: Education: Goal: Knowledge of General Education information will improve Description: Including pain rating scale, medication(s)/side effects and non-pharmacologic comfort measures Outcome: Progressing   Problem: Health Behavior/Discharge Planning: Goal: Ability to manage health-related needs will improve Outcome: Progressing   Problem: Clinical Measurements: Goal: Diagnostic test results will improve Outcome: Progressing

## 2024-08-14 NOTE — Discharge Summary (Signed)
 Discharge summary note.  Stacey Lucas FMW:[id number] DOB: 04-May-1976 DOA: 08/06/2024  PCP: Delbert Clam, MD  Admit date: 08/06/2024  Discharge date: 08/14/2024  Admitted From: Home   Disposition:  Home   Recommendations for Outpatient Follow-up:   Follow up with PCP in 1-2 weeks  PCP Please obtain BMP/CBC, 2 view CXR in 1week,  (see Discharge instructions)   PCP Please follow up on the following pending results:    Home Health: None   Equipment/Devices: None  Consultations: Renal, Psych Discharge Condition: Stable    CODE STATUS: Full    Diet Recommendation: Renal-low carbohydrate, 1.2 L fluid restriction per day.  Check CBGs q. ACHS    CC - Needs HD   Brief history of present illness from the day of admission and additional interim summary    48 y.o.  female with history of prior CVA with mild right-sided hemiparesis, ESRD on HD, HTN, DM-2-homelessness-who apparently moving to [facility] recently-has not had dialysis in several weeks-presented to the hospital with fatigue/malaise-found to have hyperkalemia.                                                                   Hospital Course    ESRD on HD, hyperkalemia due to missed dialysis Missed several weeks of hemodialysis, was treated with Lokelma  and HD.  Hyperkalemia now resolved Seen by nephrology now on MWF schedule, postdischarge follow-up with outpatient dialysis clinic physician   Normocytic anemia Likely related to ESRD No evidence of blood loss. Aranesp /Iron  defer to nephrology service.   HTN BP relatively stable continue on present regimen as below.   HLD Statin   PAF Telemetry monitoring Eliquis    History of CVA with mild right-sided hemiparesis/dysarthria Stable Apparently does use cane/walker Eliquis /statin. Seen  by PT OT and will require SNF   Homelessness SNF now.   Suicidal ideation.  Expressed during HD session few days ago.  She has been seen and cleared by psych team, currently no suicidal ideation.  Follow-up with psych outpatient in 1 to 2 weeks.   DM-2 (A1c 5.6 on 10/28) - on Lantus  and sliding scale continue present dose monitor and adjust at SNF.  Discharge diagnosis     Principal Problem:   ESRD needing dialysis (HCC) Active Problems:   DM2 (diabetes mellitus, type 2) (HCC)   Paroxysmal atrial fibrillation (HCC)   H/O: stroke   Anemia   Cerebrovascular accident (CVA) (HCC)   Hyperkalemia   Prolonged QT interval   Major depressive disorder, recurrent episode, moderate (HCC)    Discharge instructions    Discharge Instructions     Discharge instructions   Complete by: As directed    Follow with Primary MD Delbert Clam, MD in 7 days   Get CBC, CMP, Magnesium , 2 view Chest X  ray -  checked next visit with your primary MD   Activity: As tolerated with Full fall precautions use walker/cane & assistance as needed  Disposition Home    Diet: Renal-low carbohydrate diet with 1.2 L fluid restriction per day.  Check CBGs q. ACHS.  Special Instructions: If you have smoked or chewed Tobacco  in the last 2 yrs please stop smoking, stop any regular Alcohol  and or any Recreational drug use.  On your next visit with your primary care physician please Get Medicines reviewed and adjusted.  Please request your Prim.MD to go over all Hospital Tests and Procedure/Radiological results at the follow up, please get all Hospital records sent to your Prim MD by signing hospital release before you go home.  If you experience worsening of your admission symptoms, develop shortness of breath, life threatening emergency, suicidal or homicidal thoughts you must seek medical attention immediately by calling 911 or calling your MD immediately  if symptoms less severe.  You Must read complete  instructions/literature along with all the possible adverse reactions/side effects for all the Medicines you take and that have been prescribed to you. Take any new Medicines after you have completely understood and accpet all the possible adverse reactions/side effects.   Do not drive when taking Pain medications.  Do not take more than prescribed Pain, Sleep and Anxiety Medications  Wear Seat belts while driving.   Increase activity slowly   Complete by: As directed        Discharge Medications   Allergies as of 08/14/2024       Reactions   Neurontin  [gabapentin ] Other (See Comments)   Weakness  Balance impairment         Medication List     STOP taking these medications    DULoxetine  30 MG capsule Commonly known as: Cymbalta    hydrOXYzine  25 MG tablet Commonly known as: ATARAX    ondansetron  4 MG disintegrating tablet Commonly known as: ZOFRAN -ODT       TAKE these medications    amLODipine  10 MG tablet Commonly known as: NORVASC  Take 1 tablet (10 mg total) by mouth daily.   apixaban  2.5 MG Tabs tablet Commonly known as: ELIQUIS  Take 1 tablet (2.5 mg total) by mouth 2 (two) times daily.   atorvastatin  40 MG tablet Commonly known as: LIPITOR  Take 1 tablet (40 mg total) by mouth daily.   buPROPion  150 MG 24 hr tablet Commonly known as: Wellbutrin  XL Take 1 tablet (150 mg total) by mouth daily.   carvedilol  6.25 MG tablet Commonly known as: COREG  Take 1 tablet (6.25 mg total) by mouth 2 (two) times daily with a meal.   hydrALAZINE  25 MG tablet Commonly known as: APRESOLINE  Take 1 tablet (25 mg total) by mouth 2 (two) times daily.   insulin  aspart 100 UNIT/ML FlexPen Commonly known as: NOVOLOG  Before each meal 3 times a day, 140-199 - 2 units, 200-250 - 4 units, 251-299 - 6 units,  300-349 - 8 units,  350 or above 10 units.   insulin  glargine 100 UNIT/ML injection Commonly known as: LANTUS  Inject 0.08 mLs (8 Units total) into the skin daily.    isosorbide  mononitrate 30 MG 24 hr tablet Commonly known as: IMDUR  Take 1 tablet (30 mg total) by mouth daily.   pantoprazole  40 MG tablet Commonly known as: PROTONIX  Take 1 tablet (40 mg total) by mouth daily.   sertraline  25 MG tablet Commonly known as: ZOLOFT  Take 3 tablets (75 mg total) by mouth daily.  traZODone  50 MG tablet Commonly known as: DESYREL  Take 1 tablet (50 mg total) by mouth at bedtime.         Contact information for follow-up providers     [facility]. Schedule an appointment as soon as possible for a visit.   Why: Please call number listed to set up appointment with primary care physician Contact information: [phone]        [facility] Follow up.   Why: Please call [phone] to set up transportation provided through your health insurance. If this doesn't work, please call Dept of Social Services at [phone] Contact information: [phone]        Delbert Clam, MD Follow up.   Specialty: Family Medicine Why: TIME : 9:00 AM DATE : DECEMBER 17 , 2025 WEDNESDAY  PLEASE BRING  ALL CURRENT MEDICATION , ID and INS CARD, CO-PAY Contact information: [address] [place] [address] [place] [address] [phone]         [facility]. Go on 08/17/2024.   Why: [place] is Tuesday, Thursday, Saturday with 11:15 am chair time.  On Tuesday, please arrive at 10:30 am to complete paperwork prior to treatment. Contact information: [address] [place] [address] [phone]              Contact information for after-discharge care     Destination     [facility] .   Service: Skilled Nursing Contact information: [address] [place]  [address] [phone]                     Major procedures and Radiology Reports - PLEASE review detailed and final reports thoroughly  -      DG Chest 2 View Result Date: 08/06/2024 EXAM: 2 VIEW(S) XRAY OF THE  CHEST 08/06/2024 01:22:00 AM COMPARISON: Comparison with 05/26/2024. CLINICAL HISTORY: chest pain FINDINGS: LUNGS AND PLEURA: Lungs are clear. No pleural effusion. No pneumothorax. HEART AND MEDIASTINUM: Heart size and pulmonary vascularity are normal. Mediastinal contours appear intact. BONES AND SOFT TISSUES: No acute osseous abnormality. IMPRESSION: 1. No acute cardiopulmonary pathology. Electronically signed by: Elsie Gravely MD 08/06/2024 01:32 AM EST RP Workstation: [id number]    Micro Results    Recent Results (from the past 240 hours)  MRSA Next Gen by PCR, Nasal     Status: None   Collection Time: 08/06/24  4:47 AM   Specimen: Nasal Mucosa; Nasal Swab  Result Value Ref Range Status   MRSA by PCR Next Gen NOT DETECTED NOT DETECTED Final    Comment: (NOTE) The GeneXpert MRSA Assay (FDA approved for NASAL specimens only), is one component of a comprehensive MRSA colonization surveillance program. It is not intended to diagnose MRSA infection nor to guide or monitor treatment for MRSA infections. Test performance is not FDA approved in patients less than 62 years old. Performed at [facility], [address], [place], [place] [address]     Today   Subjective    Stacey Lucas today has no headache,no chest abdominal pain,no new weakness tingling or numbness, feels much better wants to go home today.    Objective   Blood pressure (!) 114/55, pulse 93, temperature 98.4 F (36.9 C), temperature source Oral, resp. rate 15, height 5' 5 (1.651 m), weight 61.9 kg, last menstrual period 10/11/2019, SpO2 100%.   Intake/Output Summary (Last 24 hours) at 08/14/2024 0752 Last data filed at 08/13/2024 1242 Gross per 24 hour  Intake --  Output  3400 ml  Net -3400 ml    Exam  Awake Alert, No new F.N deficits, chronic mild right-sided hemiparesis and dysarthria from previous stroke [place].AT,PERRAL Supple Neck,   Symmetrical Chest wall movement, Good air movement bilaterally,  CTAB RRR,No Gallops,   +ve B.Sounds, Abd Soft, Non tender,  No Cyanosis, Clubbing or edema    Data Review   Recent Labs  Lab 08/08/24 0338 08/09/24 0728 08/11/24 0512 08/13/24 1444  WBC 8.9 9.3 8.4 7.2  HGB 8.3* 7.9* 7.7* 8.5*  HCT 25.2* 24.6* 24.1* 26.0*  PLT 213 220 202 238  MCV 79.7* 81.2 81.1 81.0  MCH 26.3 26.1 25.9* 26.5  MCHC 32.9 32.1 32.0 32.7  RDW 16.7* 16.8* 16.9* 17.2*    Recent Labs  Lab 08/09/24 0728 08/11/24 0512 08/12/24 0403 08/13/24 0446 08/14/24 0458  NA 138 140 138 137 138  K 5.9* 4.5 4.3 4.7 4.2  CL 98 96* 97* 93* 97*  CO2 27 30 32 29 31  ANIONGAP 13 14 9 15 10   GLUCOSE 146* 87 100* 126* 100*  BUN 62* 57* 27* 44* 31*  CREATININE 9.05* 7.49* 4.65* 6.60* 5.07*  ALBUMIN  2.7* 2.6* 2.6* 2.6* 2.9*  PHOS 4.7* 4.8* 4.2 5.6* 5.1*  CALCIUM  7.5* 7.7* 7.8* 7.9* 7.7*    Total Time in preparing paper work, data evaluation and todays exam - 35 minutes  Signature  -    Lavada Stank M.D on 08/14/2024 at 7:52 AM   -  To page go to [email or web address]

## 2024-08-16 NOTE — Progress Notes (Addendum)
 Late Note Entry- August 16, 2024  Pt was d/c to snf on Saturday. Contacted [facility] this morning to be advised of pt's d/c date and that pt should start tomorrow as planned. Clinic provided snf name as well.   Randine Mungo Dialysis Navigator [phone]  Addendum at 10:13 am: Received a call from [facility] with request for pt's d/c summary. Summary faxed to clinic per their request in order for pt's clinic transfer to be completed.

## 2024-08-25 ENCOUNTER — Ambulatory Visit: Payer: Self-pay | Admitting: Family Medicine

## 2024-09-08 ENCOUNTER — Other Ambulatory Visit: Payer: Self-pay

## 2024-09-08 ENCOUNTER — Encounter (HOSPITAL_COMMUNITY): Payer: Self-pay

## 2024-09-08 ENCOUNTER — Emergency Department (HOSPITAL_COMMUNITY)

## 2024-09-08 ENCOUNTER — Observation Stay (HOSPITAL_COMMUNITY)

## 2024-09-08 ENCOUNTER — Inpatient Hospital Stay (HOSPITAL_COMMUNITY)
Admission: EM | Admit: 2024-09-08 | Discharge: 2024-09-11 | DRG: 100 | Disposition: A | Discharge status: Home Health | Attending: Family Medicine | Admitting: Family Medicine

## 2024-09-08 DIAGNOSIS — N186 End stage renal disease: Secondary | ICD-10-CM | POA: Diagnosis not present

## 2024-09-08 DIAGNOSIS — Z8249 Family history of ischemic heart disease and other diseases of the circulatory system: Secondary | ICD-10-CM

## 2024-09-08 DIAGNOSIS — Z833 Family history of diabetes mellitus: Secondary | ICD-10-CM

## 2024-09-08 DIAGNOSIS — Z5982 Transportation insecurity: Secondary | ICD-10-CM

## 2024-09-08 DIAGNOSIS — Z5948 Other specified lack of adequate food: Secondary | ICD-10-CM

## 2024-09-08 DIAGNOSIS — Z8673 Personal history of transient ischemic attack (TIA), and cerebral infarction without residual deficits: Secondary | ICD-10-CM | POA: Diagnosis not present

## 2024-09-08 DIAGNOSIS — I4892 Unspecified atrial flutter: Secondary | ICD-10-CM | POA: Diagnosis not present

## 2024-09-08 DIAGNOSIS — R011 Cardiac murmur, unspecified: Secondary | ICD-10-CM | POA: Diagnosis present

## 2024-09-08 DIAGNOSIS — R569 Unspecified convulsions: Principal | ICD-10-CM

## 2024-09-08 DIAGNOSIS — F331 Major depressive disorder, recurrent, moderate: Secondary | ICD-10-CM | POA: Diagnosis present

## 2024-09-08 DIAGNOSIS — Z59819 Housing instability, housed unspecified: Secondary | ICD-10-CM

## 2024-09-08 DIAGNOSIS — D649 Anemia, unspecified: Secondary | ICD-10-CM | POA: Diagnosis present

## 2024-09-08 DIAGNOSIS — N2581 Secondary hyperparathyroidism of renal origin: Secondary | ICD-10-CM | POA: Diagnosis present

## 2024-09-08 DIAGNOSIS — I480 Paroxysmal atrial fibrillation: Secondary | ICD-10-CM | POA: Diagnosis present

## 2024-09-08 DIAGNOSIS — Z888 Allergy status to other drugs, medicaments and biological substances status: Secondary | ICD-10-CM

## 2024-09-08 DIAGNOSIS — E782 Mixed hyperlipidemia: Secondary | ICD-10-CM | POA: Diagnosis not present

## 2024-09-08 DIAGNOSIS — Z5989 Other problems related to housing and economic circumstances: Secondary | ICD-10-CM

## 2024-09-08 DIAGNOSIS — F419 Anxiety disorder, unspecified: Secondary | ICD-10-CM | POA: Diagnosis present

## 2024-09-08 DIAGNOSIS — I3139 Other pericardial effusion (noninflammatory): Secondary | ICD-10-CM | POA: Diagnosis present

## 2024-09-08 DIAGNOSIS — Z59868 Other specified financial insecurity: Secondary | ICD-10-CM

## 2024-09-08 DIAGNOSIS — Z720 Tobacco use: Secondary | ICD-10-CM | POA: Diagnosis present

## 2024-09-08 DIAGNOSIS — I1311 Hypertensive heart and chronic kidney disease without heart failure, with stage 5 chronic kidney disease, or end stage renal disease: Secondary | ICD-10-CM | POA: Diagnosis present

## 2024-09-08 DIAGNOSIS — Z794 Long term (current) use of insulin: Secondary | ICD-10-CM

## 2024-09-08 DIAGNOSIS — F17210 Nicotine dependence, cigarettes, uncomplicated: Secondary | ICD-10-CM | POA: Diagnosis present

## 2024-09-08 DIAGNOSIS — Z91148 Patient's other noncompliance with medication regimen for other reason: Secondary | ICD-10-CM

## 2024-09-08 DIAGNOSIS — Z5941 Food insecurity: Secondary | ICD-10-CM

## 2024-09-08 DIAGNOSIS — Z79899 Other long term (current) drug therapy: Secondary | ICD-10-CM

## 2024-09-08 DIAGNOSIS — E785 Hyperlipidemia, unspecified: Secondary | ICD-10-CM | POA: Diagnosis present

## 2024-09-08 DIAGNOSIS — E119 Type 2 diabetes mellitus without complications: Secondary | ICD-10-CM | POA: Diagnosis not present

## 2024-09-08 DIAGNOSIS — Z66 Do not resuscitate: Secondary | ICD-10-CM | POA: Diagnosis present

## 2024-09-08 DIAGNOSIS — G40409 Other generalized epilepsy and epileptic syndromes, not intractable, without status epilepticus: Principal | ICD-10-CM | POA: Diagnosis present

## 2024-09-08 DIAGNOSIS — Z992 Dependence on renal dialysis: Secondary | ICD-10-CM

## 2024-09-08 DIAGNOSIS — Z7901 Long term (current) use of anticoagulants: Secondary | ICD-10-CM

## 2024-09-08 DIAGNOSIS — D631 Anemia in chronic kidney disease: Secondary | ICD-10-CM | POA: Diagnosis present

## 2024-09-08 DIAGNOSIS — Z716 Tobacco abuse counseling: Secondary | ICD-10-CM

## 2024-09-08 DIAGNOSIS — K219 Gastro-esophageal reflux disease without esophagitis: Secondary | ICD-10-CM | POA: Diagnosis present

## 2024-09-08 DIAGNOSIS — E1122 Type 2 diabetes mellitus with diabetic chronic kidney disease: Secondary | ICD-10-CM | POA: Diagnosis present

## 2024-09-08 DIAGNOSIS — I69351 Hemiplegia and hemiparesis following cerebral infarction affecting right dominant side: Secondary | ICD-10-CM

## 2024-09-08 LAB — COMPREHENSIVE METABOLIC PANEL WITH GFR
ALT: 15 U/L (ref 0–44)
AST: 40 U/L (ref 15–41)
Albumin: 4.2 g/dL (ref 3.5–5.0)
Alkaline Phosphatase: 92 U/L (ref 38–126)
Anion gap: 19 — ABNORMAL HIGH (ref 5–15)
BUN: 63 mg/dL — ABNORMAL HIGH (ref 6–20)
CO2: 25 mmol/L (ref 22–32)
Calcium: 7.4 mg/dL — ABNORMAL LOW (ref 8.9–10.3)
Chloride: 94 mmol/L — ABNORMAL LOW (ref 98–111)
Creatinine, Ser: 11 mg/dL — ABNORMAL HIGH (ref 0.44–1.00)
GFR, Estimated: 4 mL/min — ABNORMAL LOW
Glucose, Bld: 160 mg/dL — ABNORMAL HIGH (ref 70–99)
Potassium: 4.4 mmol/L (ref 3.5–5.1)
Sodium: 138 mmol/L (ref 135–145)
Total Bilirubin: 0.6 mg/dL (ref 0.0–1.2)
Total Protein: 7.7 g/dL (ref 6.5–8.1)

## 2024-09-08 LAB — I-STAT VENOUS BLOOD GAS, ED
Acid-Base Excess: 4 mmol/L — ABNORMAL HIGH (ref 0.0–2.0)
Acid-Base Excess: 2 mmol/L (ref 0.0–2.0)
Bicarbonate: 27.2 mmol/L (ref 20.0–28.0)
Bicarbonate: 26.7 mmol/L (ref 20.0–28.0)
Calcium, Ion: 0.74 mmol/L — CL (ref 1.15–1.40)
Calcium, Ion: 0.85 mmol/L — CL (ref 1.15–1.40)
HCT: 32 % — ABNORMAL LOW (ref 36.0–46.0)
HCT: 29 % — ABNORMAL LOW (ref 36.0–46.0)
Hemoglobin: 10.9 g/dL — ABNORMAL LOW (ref 12.0–15.0)
Hemoglobin: 9.9 g/dL — ABNORMAL LOW (ref 12.0–15.0)
O2 Saturation: 89 %
O2 Saturation: 90 %
Potassium: 4.5 mmol/L (ref 3.5–5.1)
Potassium: 3.8 mmol/L (ref 3.5–5.1)
Sodium: 136 mmol/L (ref 135–145)
Sodium: 138 mmol/L (ref 135–145)
TCO2: 28 mmol/L (ref 22–32)
TCO2: 28 mmol/L (ref 22–32)
pCO2, Ven: 33.4 mmHg — ABNORMAL LOW (ref 44–60)
pCO2, Ven: 40.9 mmHg — ABNORMAL LOW (ref 44–60)
pH, Ven: 7.519 — ABNORMAL HIGH (ref 7.25–7.43)
pH, Ven: 7.422 (ref 7.25–7.43)
pO2, Ven: 50 mmHg — ABNORMAL HIGH (ref 32–45)
pO2, Ven: 59 mmHg — ABNORMAL HIGH (ref 32–45)

## 2024-09-08 LAB — CBC
HCT: 28.6 % — ABNORMAL LOW (ref 36.0–46.0)
Hemoglobin: 9.7 g/dL — ABNORMAL LOW (ref 12.0–15.0)
MCH: 26.6 pg (ref 26.0–34.0)
MCHC: 33.9 g/dL (ref 30.0–36.0)
MCV: 78.4 fL — ABNORMAL LOW (ref 80.0–100.0)
Platelets: 178 K/uL (ref 150–400)
RBC: 3.65 MIL/uL — ABNORMAL LOW (ref 3.87–5.11)
RDW: 16.9 % — ABNORMAL HIGH (ref 11.5–15.5)
WBC: 11.7 K/uL — ABNORMAL HIGH (ref 4.0–10.5)
nRBC: 0 % (ref 0.0–0.2)

## 2024-09-08 LAB — BASIC METABOLIC PANEL WITH GFR
Anion gap: 18 — ABNORMAL HIGH (ref 5–15)
BUN: 66 mg/dL — ABNORMAL HIGH (ref 6–20)
CO2: 26 mmol/L (ref 22–32)
Calcium: 7.6 mg/dL — ABNORMAL LOW (ref 8.9–10.3)
Chloride: 95 mmol/L — ABNORMAL LOW (ref 98–111)
Creatinine, Ser: 11.7 mg/dL — ABNORMAL HIGH (ref 0.44–1.00)
GFR, Estimated: 4 mL/min — ABNORMAL LOW
Glucose, Bld: 113 mg/dL — ABNORMAL HIGH (ref 70–99)
Potassium: 4 mmol/L (ref 3.5–5.1)
Sodium: 139 mmol/L (ref 135–145)

## 2024-09-08 LAB — PHOSPHORUS: Phosphorus: 5.7 mg/dL — ABNORMAL HIGH (ref 2.5–4.6)

## 2024-09-08 LAB — MAGNESIUM: Magnesium: 2.3 mg/dL (ref 1.7–2.4)

## 2024-09-08 LAB — RETICULOCYTES
Immature Retic Fract: 9.7 % (ref 2.3–15.9)
RBC.: 3.41 MIL/uL — ABNORMAL LOW (ref 3.87–5.11)
Retic Count, Absolute: 31.4 K/uL (ref 19.0–186.0)
Retic Ct Pct: 0.9 % (ref 0.4–3.1)

## 2024-09-08 LAB — I-STAT CG4 LACTIC ACID, ED: Lactic Acid, Venous: 1.9 mmol/L (ref 0.5–1.9)

## 2024-09-08 LAB — CBG MONITORING, ED: Glucose-Capillary: 116 mg/dL — ABNORMAL HIGH (ref 70–99)

## 2024-09-08 LAB — CK: Total CK: 156 U/L (ref 38–234)

## 2024-09-08 MED ORDER — CALCIUM GLUCONATE-NACL 1-0.675 GM/50ML-% IV SOLN
1.0000 g | Freq: Once | INTRAVENOUS | Status: AC
  Administered 2024-09-08: 1000 mg via INTRAVENOUS
  Filled 2024-09-08: qty 50

## 2024-09-08 MED ORDER — INSULIN GLARGINE 100 UNIT/ML ~~LOC~~ SOLN
4.0000 [IU] | Freq: Every day | SUBCUTANEOUS | Status: DC
  Administered 2024-09-10 – 2024-09-11 (×2): 4 [IU] via SUBCUTANEOUS
  Filled 2024-09-08 (×3): qty 0.04

## 2024-09-08 MED ORDER — NICOTINE 21 MG/24HR TD PT24
21.0000 mg | MEDICATED_PATCH | Freq: Every day | TRANSDERMAL | Status: DC
  Administered 2024-09-09 – 2024-09-11 (×3): 21 mg via TRANSDERMAL
  Filled 2024-09-08 (×3): qty 1

## 2024-09-08 MED ORDER — INSULIN ASPART 100 UNIT/ML IJ SOLN: 0.0000 [IU] | INTRAMUSCULAR | Status: DC

## 2024-09-08 MED ORDER — AMLODIPINE BESYLATE 5 MG PO TABS
10.0000 mg | ORAL_TABLET | Freq: Every day | ORAL | Status: DC
  Administered 2024-09-08 – 2024-09-09 (×2): 10 mg via ORAL
  Filled 2024-09-08 (×2): qty 2

## 2024-09-08 MED ORDER — CHLORHEXIDINE GLUCONATE CLOTH 2 % EX PADS
6.0000 | MEDICATED_PAD | Freq: Every day | CUTANEOUS | Status: DC
  Administered 2024-09-10 – 2024-09-11 (×2): 6 via TOPICAL

## 2024-09-08 MED ORDER — DOXERCALCIFEROL 4 MCG/2ML IV SOLN: 2.0000 ug | INTRAVENOUS | Status: DC

## 2024-09-08 NOTE — Assessment & Plan Note (Signed)
Continue Lipitor 40 mg a day

## 2024-09-08 NOTE — Subjective & Objective (Signed)
 Patient was at hemodialysis today after about receiving hemodialysis for an hour had a witnessed seizure event that lasted 10 minutes per staff and resolved patient postictal no prior history of seizures she was transferred to [facility], ER.  On arrival blood pressure 188/90 blood sugar 230 CT head nonacute but she was found to have low calcium  Neurology was consulted recommended MRI brain and correcting calcium  Nephrology was consulted recommending given 1 g of calcium  and continue to replace as an outpatient

## 2024-09-08 NOTE — ED Notes (Signed)
 This nurse has attempted to get blood for several hours. I had to stick twice for an IV and then ended up having IV team get it for me. I was able to get initial labs and more were ordered. Patients IV does not have blood return and I attempted to straight stick two more times with no success, Phlebotomy also attempted to draw and was unable to. I have since asked night shift to attempt blood draw.

## 2024-09-08 NOTE — Assessment & Plan Note (Signed)
 Noted cardiac murmur on exam Echo in AM

## 2024-09-08 NOTE — Assessment & Plan Note (Signed)
 Monitor on telemetry continue Eliquis  continue Coreg  6.25 mg p.o. twice daily

## 2024-09-08 NOTE — H&P (Addendum)
 "    Stacey Lucas FMW:[id number] DOB: 03/28/1976 DOA: 09/08/2024     PCP: Delbert Clam, MD     Patient arrived to ER on 09/08/24 at 1404 Referred by Attending Silvester Ales, MD   Patient coming from:    home Lives   With family   Chief Complaint: New onset seizure  HPI: Stacey Lucas is a 48 y.o. female with medical history significant of ESRD on dialysis MWF history of CVA with mild right-sided hemiparesis,, HTN, diabetes type 2, paroxysmal atrial fibrillation on Eliquis , anemia, depression, polysubstance abuse    Presented with new onset seizures Patient was at hemodialysis today after about receiving hemodialysis for an hour had a witnessed seizure event that lasted 10 minutes per staff and resolved patient postictal no prior history of seizures she was transferred to [facility], ER.  On arrival blood pressure 188/90 blood sugar 230 CT head nonacute but she was found to have low calcium  Neurology was consulted recommended MRI brain and correcting calcium  Nephrology was consulted recommending given 1 g of calcium  and continue to replace as an outpatient  Patient states she has been out nursing home for past 2 weeks she lives now with her mom her mom is unable to take care of her and unable to get her medications because she is also disabled herself.  Patient states she has not been taking much of her medications or her insulin  for the past 2 weeks She states that she usually gets hemodialysis on Tuesday to Thursday Saturday but she is unsure why she was getting it on Wednesday today She has not had any fevers or chills no chest pain or shortness of breath She is unsure what has been going on the recently just woke up and was already in the emergency department   Per review of records patient was admitted early in December for missed dialysis  Denies significant ETOH intake   Does  smoke  but interested in quitting   Denies marijuana use      Regarding pertinent  Chronic problems:     Hyperlipidemia -  on statins Lipitor  (atorvastatin )  Lipid Panel     Component Value Date/Time   CHOL 190 11/15/2022 0618   CHOL 218 (H) 03/05/2022 1045   TRIG 205 (H) 11/15/2022 0618   HDL 50 11/15/2022 0618   HDL 76 03/05/2022 1045   CHOLHDL 3.8 11/15/2022 0618   VLDL 41 (H) 11/15/2022 0618   LDLCALC 99 11/15/2022 0618   LDLCALC 120 (H) 03/05/2022 1045   LABVLDL 22 03/05/2022 1045     HTN on Norvasc , Coreg , hydralazine   last echo  Recent Results (from the past 56199 hours)  ECHOCARDIOGRAM COMPLETE   Collection Time: 07/07/23 12:12 PM  Result Value   Weight 2,328.06   Height 65   BP 156/79   Single Plane A4C EF 61.3   S' Lateral 3.00   AR max vel 2.11   AV Area VTI 2.00   AV Mean grad 4.0   AV Peak grad 7.2   Ao pk vel 1.34   Area-P 1/2 5.06   AV Area mean vel 2.05   Est EF 55 - 60%   Narrative      ECHOCARDIOGRAM REPORT     IMPRESSIONS    1. Left ventricular ejection fraction, by estimation, is 55 to 60%. The left ventricle has normal function. The left ventricle has no regional wall motion abnormalities. There is moderate concentric left ventricular hypertrophy. Left ventricular  diastolic  function could not be evaluated. GLS -11.3%  2. Right ventricular systolic function is normal. The right ventricular size is normal. There is normal pulmonary artery systolic pressure.  3. Left atrial size was severely dilated.  4. The mitral valve is normal in structure. Trivial mitral valve regurgitation. No evidence of mitral stenosis.  5. The aortic valve is normal in structure. Aortic valve regurgitation is not visualized. No aortic stenosis is present.  6. The inferior vena cava is normal in size with greater than 50% respiratory variability, suggesting right atrial pressure of 3 mmHg.                DM 2 -  Lab Results  Component Value Date   HGBA1C 6.5 11/12/2023   on insulin ,        Hx of CVA -  with  residual deficits on Eliquis  and  Lipitor    A. Fib -   atrial fibrillation CHA2DS2 vas score    5    current  on anticoagulation with Eliquis ,           -  Rate control:   Coreg         End-stage renal disease on hemodialysis Monday Wednesday Friday  Lab Results  Component Value Date   CREATININE 11.00 (H) 09/08/2024   CREATININE 5.07 (H) 08/14/2024   CREATININE 6.60 (H) 08/13/2024   Lab Results  Component Value Date   NA 138 09/08/2024   CL 94 (L) 09/08/2024   K 4.4 09/08/2024   CO2 25 09/08/2024   BUN 63 (H) 09/08/2024   CREATININE 11.00 (H) 09/08/2024   GFRNONAA 4 (L) 09/08/2024   CALCIUM  7.4 (L) 09/08/2024   PHOS 5.1 (H) 08/14/2024   ALBUMIN  4.2 09/08/2024   GLUCOSE 160 (H) 09/08/2024     Chronic anemia - baseline hg Hemoglobin & Hematocrit  Recent Labs    08/13/24 1444 09/08/24 1455 09/08/24 1504  HGB 8.5* 10.9* 9.7*   Iron /TIBC/Ferritin/ %Sat    Component Value Date/Time   IRON  126 08/06/2024 0434   TIBC 200 (L) 08/06/2024 0434   FERRITIN 773 (H) 08/06/2024 0434   IRONPCTSAT 63 (H) 08/06/2024 0434     While in ER:     Hypocalcemia ionized ca 0.74    Lab Orders         CBC         Comprehensive metabolic panel         Urine Drug Screen         Urinalysis, Routine w reflex microscopic -Urine, Catheterized         Phosphorus         Magnesium          Ethanol         CK         Basic metabolic panel with GFR         I-Stat CG4 Lactic Acid         I-Stat venous blood gas, ED         I-Stat venous blood gas, ([facility] ED, [facility], [facility])      CT HEAD   NON acute   MRI brain   no acute CVA  CXR -  Prominence of the central pulmonary vasculature with bilateral interstitial opacities, concerning for mild pulmonary edema. 2. Mild cardiomegaly.    Following Medications were ordered in ER: Medications  calcium  gluconate 1 g/ 50 mL sodium chloride  IVPB (0 mg Intravenous Stopped 09/08/24 1842)    _______________________________________________________ ER Provider  Called:    Neurology Dr.  Michaela They Recommend MRI of the brain felt that the hypocalcemia may have contributed to new onset seizure  Nephrology Dr.Bhandari  Seen patient Recommends correcting calcium  Appreciate assistance and help with management of ESRD   ED Triage Vitals  Encounter Vitals Group     BP 09/08/24 1435 (!) 193/92     Girls Systolic BP Percentile --      Girls Diastolic BP Percentile --      Boys Systolic BP Percentile --      Boys Diastolic BP Percentile --      Pulse Rate 09/08/24 1437 98     Resp 09/08/24 1435 19     Temp 09/08/24 1437 98 F (36.7 C)     Temp Source 09/08/24 1437 Oral     SpO2 09/08/24 1437 96 %     Weight 09/08/24 1422 136 lb 7.4 oz (61.9 kg)     Height 09/08/24 1422 5' 5 (1.651 m)     Head Circumference --      Peak Flow --      Pain Score 09/08/24 1409 4     Pain Loc --      Pain Education --      Exclude from Growth Chart --   UFJK(75)@     _________________________________________ Significant initial  Findings: Abnormal Labs Reviewed  CBC - Abnormal; Notable for the following components:      Result Value   WBC 11.7 (*)    RBC 3.65 (*)    Hemoglobin 9.7 (*)    HCT 28.6 (*)    MCV 78.4 (*)    RDW 16.9 (*)    All other components within normal limits  COMPREHENSIVE METABOLIC PANEL WITH GFR - Abnormal; Notable for the following components:   Chloride 94 (*)    Glucose, Bld 160 (*)    BUN 63 (*)    Creatinine, Ser 11.00 (*)    Calcium  7.4 (*)    GFR, Estimated 4 (*)    Anion gap 19 (*)    All other components within normal limits  I-STAT VENOUS BLOOD GAS, ED - Abnormal; Notable for the following components:   pH, Ven 7.519 (*)    pCO2, Ven 33.4 (*)    pO2, Ven 50 (*)    Acid-Base Excess 4.0 (*)    Calcium , Ion 0.74 (*)    HCT 32.0 (*)    Hemoglobin 10.9 (*)    All other components within normal limits        ECG: Ordered   BNP (last 3 results) Recent Labs    09/17/23 1910 11/01/23 1915 03/24/24 0353  BNP 1,284.2* 2,761.9*  1,350.5*      The recent clinical data is shown below. Vitals:   09/08/24 1435 09/08/24 1437 09/08/24 1511 09/08/24 1830  BP: (!) 193/92   (!) 210/93  Pulse:  98    Resp: 19   (!) 0  Temp:  98 F (36.7 C) 97.9 F (36.6 C)   TempSrc:  Oral Oral   SpO2:  96%    Weight:      Height:        WBC     Component Value Date/Time   WBC 11.7 (H) 09/08/2024 1504   LYMPHSABS 2.4 04/16/2024 1408   LYMPHSABS 3.0 03/05/2022 1045   MONOABS 0.7 04/16/2024 1408   EOSABS 0.5 04/16/2024 1408   EOSABS 0.3 03/05/2022 1045   BASOSABS 0.1 04/16/2024 1408   BASOSABS 0.0  03/05/2022 1045     Lactic Acid, Venous    Component Value Date/Time   LATICACIDVEN 1.9 09/08/2024 1502      UA  not ordered     Results for orders placed or performed during the hospital encounter of 08/06/24  MRSA Next Gen by PCR, Nasal     Status: None   Collection Time: 08/06/24  4:47 AM   Specimen: Nasal Mucosa; Nasal Swab  Result Value Ref Range Status   MRSA by PCR Next Gen NOT DETECTED NOT DETECTED Final    Comment: (NOTE) The GeneXpert MRSA Assay (FDA approved for NASAL specimens only), is one component of a comprehensive MRSA colonization surveillance program. It is not intended to diagnose MRSA infection nor to guide or monitor treatment for MRSA infections. Test performance is not FDA approved in patients less than 20 years old. Performed at [facility], [address], [place], [place] [address]    ___________________________________________________  Venous  Blood Gas result:  pH   7.519 High  Sodium 136 mmol/L   pCO2, Ven 33.4 Low  mmHg Potassium 4.5 mmol/L  pO2, Ven 50 High  mmHg       __________________________________________________________ Recent Labs  Lab 09/08/24 1455 09/08/24 1504  NA 136 138  K 4.5 4.4  CO2  --  25  GLUCOSE  --  160*  BUN  --  63*  CREATININE  --  11.00*  CALCIUM   --  7.4*    Cr    Lab Results  Component Value Date   CREATININE 11.00 (H) 09/08/2024    CREATININE 5.07 (H) 08/14/2024   CREATININE 6.60 (H) 08/13/2024    Recent Labs  Lab 09/08/24 1504  AST 40  ALT 15  ALKPHOS 92  BILITOT 0.6  PROT 7.7  ALBUMIN  4.2   Lab Results  Component Value Date   CALCIUM  7.4 (L) 09/08/2024   PHOS 5.1 (H) 08/14/2024    Plt: Lab Results  Component Value Date   PLT 178 09/08/2024       Recent Labs  Lab 09/08/24 1455 09/08/24 1504  WBC  --  11.7*  HGB 10.9* 9.7*  HCT 32.0* 28.6*  MCV  --  78.4*  PLT  --  178    HG/HCT   stable,     Component Value Date/Time   HGB 9.7 (L) 09/08/2024 1504   HGB 10.9 (L) 03/05/2022 1045   HCT 28.6 (L) 09/08/2024 1504   HCT 32.6 (L) 03/05/2022 1045   MCV 78.4 (L) 09/08/2024 1504   MCV 77 (L) 03/05/2022 1045    _______________________________________________ Hospitalist was called for admission for   New onset seizure hypocalcemia   The following Work up has been ordered so far:  Orders Placed This Encounter  Procedures   CT Head Wo Contrast   DG Chest Port 1 View   MR BRAIN WO CONTRAST   CBC   Comprehensive metabolic panel   Urine Drug Screen   Urinalysis, Routine w reflex microscopic -Urine, Catheterized   Phosphorus   Magnesium    Ethanol   CK   Basic metabolic panel with GFR   Vital signs - notify MD   Check Rectal Temperature   Cardiac Monitoring - Continuous Indefinite   Consult to Neuro Hospitalist   Consult to nephrology   Consult for Unassigned Medical Admission   I-Stat CG4 Lactic Acid   I-Stat venous blood gas, ED   I-Stat venous blood gas, ([facility], [facility], [facility])   ED EKG  Place in observation (patient's expected length of stay will be less than 2 midnights)     OTHER Significant initial  Findings:  labs showing:     DM  labs:  HbA1C: Recent Labs    11/12/23 1054  HGBA1C 6.5      CBG (last 3)  No results for input(s): GLUCAP in the last 72 hours.        Cultures:    Component Value Date/Time   SDES URINE, RANDOM 11/14/2022 1902   SPECREQUEST   11/14/2022 1902    NONE Reflexed from [id number] Performed at [facility], [address] [place], [place] [address]    CULT >=100,000 COLONIES/mL ESCHERICHIA COLI (A) 11/14/2022 1902   REPTSTATUS 11/17/2022 FINAL 11/14/2022 1902     Radiological Exams on Admission: DG Chest Port 1 View Result Date: 09/08/2024 EXAM: 1 VIEW(S) XRAY OF THE CHEST 09/08/2024 03:49:00 PM COMPARISON: 08/06/2024 CLINICAL HISTORY: ams FINDINGS: LUNGS AND PLEURA: Prominence of the central pulmonary vasculature with bilateral interstitial opacities concerning for mild pulmonary edema. No pleural effusion. No pneumothorax. HEART AND MEDIASTINUM: Similar appearance of mild cardiomegaly. No acute abnormality of the mediastinal silhouette. BONES AND SOFT TISSUES: No acute osseous abnormality. IMPRESSION: 1. Prominence of the central pulmonary vasculature with bilateral interstitial opacities, concerning for mild pulmonary edema. 2. Mild cardiomegaly. Electronically signed by: Donnice Mania MD 09/08/2024 04:39 PM EST RP Workstation: [id number]   CT Head Wo Contrast Result Date: 09/08/2024 EXAM: CT HEAD WITHOUT CONTRAST 09/08/2024 03:24:31 PM TECHNIQUE: CT of the head was performed without the administration of intravenous contrast. Automated exposure control, iterative reconstruction, and/or weight based adjustment of the mA/kV was utilized to reduce the radiation dose to as low as reasonably achievable. COMPARISON: None available. CLINICAL HISTORY: Mental status change, unknown cause FINDINGS: BRAIN AND VENTRICLES: No acute hemorrhage. No evidence of acute infarct. Moderate chronic small vessel ischemic disease. Remote bilateral basal ganglia infarcts, left greater than right. Intracranial arterial calcification. No hydrocephalus. No extra-axial collection. No mass effect or midline shift. ORBITS: No acute abnormality. SINUSES: Remote left lamina papyracea fracture. SOFT TISSUES AND SKULL: No acute soft tissue abnormality. No  skull fracture. IMPRESSION: 1. No acute intracranial abnormality. Electronically signed by: Kate Plummer MD 09/08/2024 03:39 PM EST RP Workstation: [id number]   _______________________________________ Latest  Blood pressure (!) 210/93, pulse 98, temperature 97.9 F (36.6 C), temperature source Oral, resp. rate (!) 0, height 5' 5 (1.651 m), weight 61.9 kg, last menstrual period 10/11/2019, SpO2 96%.   Vitals  labs and radiology finding personally reviewed  Review of Systems:    Pertinent positives include:   fatigue, new onset seizures  Constitutional:  No weight loss, night sweats, Fevers, chills, weight loss  HEENT:  No headaches, Difficulty swallowing,Tooth/dental problems,Sore throat,  No sneezing, itching, ear ache, nasal congestion, post nasal drip,  Cardio-vascular:  No chest pain, Orthopnea, PND, anasarca, dizziness, palpitations.no Bilateral lower extremity swelling  GI:  No heartburn, indigestion, abdominal pain, nausea, vomiting, diarrhea, change in bowel habits, loss of appetite, melena, blood in stool, hematemesis Resp:  no shortness of breath at rest. No dyspnea on exertion, No excess mucus, no productive cough, No non-productive cough, No coughing up of blood.No change in color of mucus.No wheezing. Skin:  no rash or lesions. No jaundice GU:  no dysuria, change in color of urine, no urgency or frequency. No straining to urinate.  No flank pain.  Musculoskeletal:  No joint pain or no joint swelling. No decreased range of motion. No back  pain.  Psych:  No change in mood or affect. No depression or anxiety. No memory loss.  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  12/29/2021   Anxiety and depression    Atrial fibrillation (HCC)    Cerebral thrombosis with cerebral infarction 05/24/2021   Cocaine abuse (HCC)    Depression with suicidal ideation    [facility] admission 04/2018   Diabetes mellitus without complication (HCC)    Type II   End stage kidney disease (HCC)    GERD (gastroesophageal reflux disease)    Hyperlipidemia    Hypertension    Impingement syndrome of right shoulder    Migraine headache    Osteoarthritis of right knee 05/24/2021   Pilonidal abscess 05/2013   Pyelonephritis    Right thyroid nodule    Sciatica      Past Surgical History:  Procedure Laterality Date   AV FISTULA PLACEMENT Left 03/17/2023   Procedure: LEFT ARM BRACHIOCEPHALIC ARTERIOVENOUS (AV) FISTULA CREATION;  Surgeon: Lanis Fonda BRAVO, MD;  Location: [facility] OR;  Service: Vascular;  Laterality: Left;   BUBBLE STUDY  01/01/2022   Procedure: BUBBLE STUDY;  Surgeon: Alvan Ronal BRAVO, MD;  Location: [facility] ENDOSCOPY;  Service: Cardiovascular;;   IR FLUORO GUIDE CV LINE RIGHT  03/14/2023   IR US  GUIDE VASC ACCESS RIGHT  03/14/2023   TEE WITHOUT CARDIOVERSION N/A 01/01/2022   Procedure: TRANSESOPHAGEAL ECHOCARDIOGRAM (TEE);  Surgeon: Alvan Ronal BRAVO, MD;  Location: [facility] ENDOSCOPY;  Service: Cardiovascular;  Laterality: N/A;   tubal     TUBAL LIGATION      Social History:  Ambulatory   independently      reports that she has been smoking cigarettes. She has been smoking an average of 0.3 packs per day. She has been exposed to tobacco smoke. She has never used smokeless tobacco. She reports current alcohol use. She reports that she does not use drugs.   Family History:   Family History  Problem Relation Age of Onset   Diabetes Mother    Hypertension Mother    Arthritis Mother    Diabetes Father    Hypertension Father     ______________________________________________________________________________________________ Allergies: Allergies[1]   Prior to Admission medications  Medication Sig Start Date End Date Taking? Authorizing Provider  amLODipine  (NORVASC ) 10 MG tablet Take 1 tablet (10 mg total) by mouth daily. Patient not taking: Reported on 08/06/2024 11/12/23   Newlin, Enobong, MD  apixaban  (ELIQUIS ) 2.5 MG TABS tablet Take 1 tablet (2.5 mg total) by mouth 2 (two) times daily. Patient not taking: Reported on 08/06/2024 11/12/23   Newlin, Enobong, MD  atorvastatin  (LIPITOR ) 40 MG tablet Take 1 tablet (40 mg total) by mouth daily. Patient not taking: Reported on 08/06/2024 07/08/23   Krishnan, Gokul, MD  buPROPion  (WELLBUTRIN  XL) 150 MG 24 hr tablet Take 1 tablet (150 mg total) by mouth daily. Patient not taking: Reported on 08/06/2024 11/12/23   Newlin, Enobong, MD  carvedilol  (COREG ) 6.25 MG tablet Take 1 tablet (6.25 mg total) by mouth 2 (two) times daily with a meal. Patient not taking: Reported on 08/06/2024 11/12/23   Newlin, Enobong, MD  hydrALAZINE  (APRESOLINE ) 25 MG tablet Take 1 tablet (25 mg total)  by mouth 2 (two) times daily. Patient not taking: Reported on 08/06/2024 02/17/24   Newlin, Enobong, MD  insulin  aspart (NOVOLOG ) 100 UNIT/ML FlexPen Before each meal 3 times a day, 140-199 - 2 units, 200-250 - 4 units, 251-299 - 6 units,  300-349 - 8 units,  350 or above 10 units. 08/14/24   Singh, Prashant K, MD  insulin  glargine (LANTUS ) 100 UNIT/ML injection Inject 0.08 mLs (8 Units total) into the skin daily. 08/14/24   Singh, Prashant K, MD  isosorbide  mononitrate (IMDUR ) 30 MG 24 hr tablet Take 1 tablet (30 mg total) by mouth daily. 08/14/24   Dennise Lavada POUR, MD  pantoprazole  (PROTONIX ) 40 MG tablet Take 1 tablet (40 mg total) by mouth daily. 08/14/24   Dennise Lavada POUR, MD  sertraline  (ZOLOFT ) 25 MG tablet Take 3 tablets (75 mg total) by mouth daily. 08/14/24   Dennise Lavada POUR, MD  traZODone   (DESYREL ) 50 MG tablet Take 1 tablet (50 mg total) by mouth at bedtime. 08/14/24   Dennise Lavada POUR, MD    ___________________________________________________________________________________________________ Physical Exam:    09/08/2024    6:30 PM 09/08/2024    2:37 PM 09/08/2024    2:35 PM  Vitals with BMI  Systolic 210  193  Diastolic 93  92  Pulse  98      1. General:  in No  Acute distress   Chronically ill   -appearing 2. Psychological: Alert and   Oriented 3. Head/ENT:   Dry Mucous Membranes                          Head Non traumatic, neck supple                      Poor Dentition 4. SKIN:  decreased Skin turgor,  Skin clean Dry and intact no rash  5. Heart: Regular rate and rhythm no  Murmur, no Rub or gallop 6. Lungs:  no wheezes or crackles   7. Abdomen: Soft,  non-tender, Non distended  obese  bowel sounds present 8. Lower extremities: no clubbing, cyanosis, no  edema 9. Neurologically decreased strength on the right  10. MSK: Normal range of motion    Chart has been reviewed  ______________________________________________________________________________________________  Assessment/Plan 48 y.o. female with medical history significant of ESRD on dialysis MWF history of CVA with mild right-sided hemiparesis,, HTN, diabetes type 2, paroxysmal atrial fibrillation on Eliquis , anemia, depression, polysubstance abuse  Admitted for   New onset seizure hypocalcemia    Present on Admission:  Paroxysmal atrial fibrillation (HCC)  ESRD (end stage renal disease) (HCC)  Hyperlipidemia  Major depressive disorder, recurrent episode, moderate (HCC)  Hypocalcemia  Anemia  Tobacco abuse  Cardiac murmur     New onset seizure [facility]) Neurology consulted regarding mended MRI and correcting calcium   Paroxysmal atrial fibrillation (HCC) Monitor on telemetry continue Eliquis  continue Coreg  6.25 mg p.o. twice daily  DM2 (diabetes mellitus, type 2) (HCC) Order sliding  scale Continue Lantus  4 units subcu daily  ESRD (end stage renal disease) (HCC) Appreciate nephrology consult  H/O: stroke Continue Lipitor  when able to tolerate Continue Eliquis   Hyperlipidemia Continue Lipitor  40 mg a day  Major depressive disorder, recurrent episode, moderate (HCC) Continue Zoloft  75 mg daily and trazodone  50 mg at bedtime  Hypocalcemia Replace and recheck, Obtain EKG  Anemia Obtain anemia panel  Transfuse for Hg <7 , rapidly dropping or  if symptomatic   Tobacco abuse  -  Spoke about importance of quitting spent 5 minutes discussing options for treatment, prior attempts at quitting, and dangers of smoking  -At this point patient is    interested in quitting  - order nicotine  patch   - nursing tobacco cessation protocol   Cardiac murmur Noted cardiac murmur on exam Echo in AM   Other plan as per orders.  DVT prophylaxis:  SCD      Code Status:    Code Status: Prior   DNR/DNI   as per patient   I had personally discussed CODE STATUS with patient   ACP  has been reviewed     Family Communication:   Family not at  Bedside    Diet diabetic heart healthy renal   Disposition Plan:     likely will need placement for rehabilitation                            Following barriers for discharge:                               Electrolytes corrected                                                                                      Will need consultants to evaluate patient prior to discharge                           Consult Orders  (From admission, onward)           Start     Ordered   09/08/24 1439  Consult to Neuro Hospitalist  Paged by [place] @ 14:42  Once       Provider:  (Not yet assigned)  Question Answer Comment  Place call to: Neuro Hospitalist on call   Reason for Consult Admit      09/08/24 1438            Would benefit from PT/OT eval prior to DC  Ordered                                       Consults called:  Nephrology aware Treatment Team:  Stacey Katheryn BROCKS, MD Neurology made recommendations with need to follow-up as an outpatient Admission status:  ED Disposition     ED Disposition  Admit   Condition  --   Comment  Hospital Area: [facility] [[id number]]  Level of Care: Progressive [102]  Admit to Progressive based on following criteria: NEUROLOGICAL AND NEUROSURGICAL complex patients with significant risk of instability, who do not meet ICU criteria, yet require close observation or frequent assessment (< / = every 2 - 4 hours) with medical / nursing intervention.  May place patient in observation at [facility] or [facility] if equivalent level of care is available:: No  Diagnosis: New onset seizure [facility]) [[id number]]  Admitting Physician: Stacey Lucas [[id number]]  Attending Physician: Stacey Lucas [[id number]]  Obs      Level of care        progressive   Blease Quiver 09/08/2024, 10:34 PM    [facility]     after 2 AM please page floor coverage   If 7AM-7PM, please contact the day team taking care of the patient using [email or web address]        [1]  Allergies Allergen Reactions   Neurontin  [Gabapentin ] Other (See Comments)    Weakness  Balance impairment    "

## 2024-09-08 NOTE — ED Notes (Signed)
 Dr Silvester made aware around 2100 that the patient has been stuck multiple times for blood work and it has been unsuccessful. Phlebotomy at bedside. They were unable to get the patient blood work.

## 2024-09-08 NOTE — Assessment & Plan Note (Signed)
 Obtain anemia panel  Transfuse for Hg <7 , rapidly dropping or  if symptomatic

## 2024-09-08 NOTE — ED Provider Notes (Signed)
 Patient seen after prior ED provider.  Case discussed with Dr. Michaela, neurology.  He recommends admission for seizure observation.  He request MRI brain.  He feels that the hypocalcemia may have induced the patient's reported seizure activity.  Dr. Dolan, nephrology is aware of case.  He is agreeable with plan to replete calcium  with 1 g of calcium  gluconate here in the ED.  Further calcium  correction can occur in the outpatient setting after completion of inpatient workup.  No indication for dialysis emergently at this time.  Dr. Silvester, hospitalist service, is aware of case and will evaluate for admission.   Laurice Maude BROCKS, MD 09/08/24 [phone]

## 2024-09-08 NOTE — ED Notes (Signed)
 Patient does not make urine.

## 2024-09-08 NOTE — ED Triage Notes (Signed)
 Pt was BIB [facility] from dialysis with complaints of a seizure that lasted about 10 min per staff. Self terminated. She was in dialysis for an hour when the seizure occurred. She had no HX of seizures. Patient is alert to verbal. Fistula on the left side   188/90 100 HR 96% RA 230 CBG

## 2024-09-08 NOTE — ED Provider Notes (Signed)
 " [facility] Provider Note   CSN: [id number] Arrival date & time: 09/08/24  1404     Patient presents with: No chief complaint on file.   Stacey Lucas is a 48 y.o. female.   HPI 48 year old female history of type 2 diabetes, hypertension, end-stage renal disease, on dialysis, paroxysmal A-fib, CVA, polysubstance abuse, presents today from dialysis with report of seizures.  Reported that she had tenderness of grand mal seizure which self terminated.  She has continued to have decreased responsiveness since that time.  No report of prior seizure.  Per report patient received approximately 1 hour of dialysis.  Prehospital blood sugar 230.  Prehospital vital signs listed is 188/90 with a heart rate of 100 oxygen saturations of 96%    Prior to Admission medications  Medication Sig Start Date End Date Taking? Authorizing Provider  amLODipine  (NORVASC ) 10 MG tablet Take 1 tablet (10 mg total) by mouth daily. Patient not taking: Reported on 08/06/2024 11/12/23   Newlin, Enobong, MD  apixaban  (ELIQUIS ) 2.5 MG TABS tablet Take 1 tablet (2.5 mg total) by mouth 2 (two) times daily. Patient not taking: Reported on 08/06/2024 11/12/23   Newlin, Enobong, MD  atorvastatin  (LIPITOR ) 40 MG tablet Take 1 tablet (40 mg total) by mouth daily. Patient not taking: Reported on 08/06/2024 07/08/23   Krishnan, Gokul, MD  buPROPion  (WELLBUTRIN  XL) 150 MG 24 hr tablet Take 1 tablet (150 mg total) by mouth daily. Patient not taking: Reported on 08/06/2024 11/12/23   Newlin, Enobong, MD  carvedilol  (COREG ) 6.25 MG tablet Take 1 tablet (6.25 mg total) by mouth 2 (two) times daily with a meal. Patient not taking: Reported on 08/06/2024 11/12/23   Newlin, Enobong, MD  hydrALAZINE  (APRESOLINE ) 25 MG tablet Take 1 tablet (25 mg total) by mouth 2 (two) times daily. Patient not taking: Reported on 08/06/2024 02/17/24   Newlin, Enobong, MD  insulin  aspart (NOVOLOG ) 100 UNIT/ML FlexPen  Before each meal 3 times a day, 140-199 - 2 units, 200-250 - 4 units, 251-299 - 6 units,  300-349 - 8 units,  350 or above 10 units. 08/14/24   Singh, Prashant K, MD  insulin  glargine (LANTUS ) 100 UNIT/ML injection Inject 0.08 mLs (8 Units total) into the skin daily. 08/14/24   Singh, Prashant K, MD  isosorbide  mononitrate (IMDUR ) 30 MG 24 hr tablet Take 1 tablet (30 mg total) by mouth daily. 08/14/24   Singh, Prashant K, MD  pantoprazole  (PROTONIX ) 40 MG tablet Take 1 tablet (40 mg total) by mouth daily. 08/14/24   Singh, Prashant K, MD  sertraline  (ZOLOFT ) 25 MG tablet Take 3 tablets (75 mg total) by mouth daily. 08/14/24   Dennise Lavada POUR, MD  traZODone  (DESYREL ) 50 MG tablet Take 1 tablet (50 mg total) by mouth at bedtime. 08/14/24   Dennise Lavada POUR, MD    Allergies: Neurontin  [gabapentin ]    Review of Systems  Updated Vital Signs BP (!) 193/92   Pulse 98   Temp 97.9 F (36.6 C) (Oral)   Resp 19   Ht 1.651 m (5' 5)   Wt 61.9 kg   LMP 10/11/2019   SpO2 96%   BMI 22.71 kg/m   Physical Exam Vitals reviewed.  Constitutional:      General: She is not in acute distress.    Appearance: She is not ill-appearing.     Comments: Eyes closed but opens and attempts to answer questions  HENT:     Head:  Normocephalic and atraumatic.     Right Ear: External ear normal.     Left Ear: External ear normal.     Nose: Nose normal.     Mouth/Throat:     Pharynx: Oropharynx is clear.  Eyes:     Pupils: Pupils are equal, round, and reactive to light.  Cardiovascular:     Rate and Rhythm: Normal rate and regular rhythm.     Pulses: Normal pulses.     Heart sounds: Normal heart sounds.  Pulmonary:     Effort: Pulmonary effort is normal.  Abdominal:     General: Abdomen is flat. Bowel sounds are normal.  Genitourinary:    Comments: Loose stool in diaper Musculoskeletal:        General: Normal range of motion.     Cervical back: Normal range of motion.  Skin:    General: Skin is warm and  dry.     Capillary Refill: Capillary refill takes less than 2 seconds.  Neurological:     General: No focal deficit present.     Cranial Nerves: No cranial nerve deficit.     Comments: Patient amnestic for event.  She is sleepy.  She is able to tell me her name but is unable to tell me where she is except at the doctor's and is unable to tell me date     (all labs ordered are listed, but only abnormal results are displayed) Labs Reviewed  I-STAT VENOUS BLOOD GAS, ED - Abnormal; Notable for the following components:      Result Value   pH, Ven 7.519 (*)    pCO2, Ven 33.4 (*)    pO2, Ven 50 (*)    Acid-Base Excess 4.0 (*)    Calcium , Ion 0.74 (*)    HCT 32.0 (*)    Hemoglobin 10.9 (*)    All other components within normal limits  CBC  COMPREHENSIVE METABOLIC PANEL WITH GFR  URINE DRUG SCREEN  URINALYSIS, ROUTINE W REFLEX MICROSCOPIC  I-STAT CG4 LACTIC ACID, ED  I-STAT VENOUS BLOOD GAS, ED    EKG: None  Radiology: No results found.   Procedures   Medications Ordered in the ED - No data to display                                  Medical Decision Making Amount and/or Complexity of Data Reviewed Labs: ordered. Radiology: ordered.   47 year old female presents today from dialysis with reports of seizure.  This is reported to be a first-time seizure.  Patient is being evaluated here in the ED for etiology of seizure including cute electrolyte abnormality, intracranial abnormality, stroke. Labs ordered and pending CT head ordered and pending Care discussed with Dr. Laurice who assumes care     Final diagnoses:  New onset seizure [facility])    ED Discharge Orders     None          Levander Houston, MD 09/08/24 1526  "

## 2024-09-08 NOTE — Assessment & Plan Note (Signed)
-  Spoke about importance of quitting spent 5 minutes discussing options for treatment, prior attempts at quitting, and dangers of smoking ? -At this point patient is    interested in quitting ? - order nicotine patch  ? - nursing tobacco cessation protocol ? ?

## 2024-09-08 NOTE — ED Notes (Signed)
With MRI

## 2024-09-08 NOTE — Assessment & Plan Note (Addendum)
 Order sliding scale Continue Lantus  4 units subcu daily

## 2024-09-08 NOTE — Assessment & Plan Note (Signed)
 Appreciate nephrology consult

## 2024-09-08 NOTE — Assessment & Plan Note (Signed)
 Continue Lipitor  when able to tolerate Continue Eliquis 

## 2024-09-08 NOTE — Assessment & Plan Note (Addendum)
 Replace and recheck, Obtain EKG

## 2024-09-08 NOTE — Progress Notes (Signed)
 Clamps removed from left upper arm fistula. Tapped with no bleeding.

## 2024-09-08 NOTE — Assessment & Plan Note (Signed)
 Neurology consulted regarding mended MRI and correcting calcium 

## 2024-09-08 NOTE — Assessment & Plan Note (Signed)
 Continue Zoloft  75 mg daily and trazodone  50 mg at bedtime

## 2024-09-08 NOTE — Consult Note (Signed)
 [facility] Nephrology Consult Note: Reason for Consult: To manage dialysis and dialysis related needs Referring Physician: Dr. Doutova, Anastassia.  HPI:  Stacey Lucas is an 48 y.o. female with past medical history significant for hypertension, type 2 diabetes, CVA, A-fib, anxiety depression, dyslipidemia, ESRD on HD TTS who was brought in from dialysis center after a witnessed seizure, seen as a consultation for the management of ESRD. Apparently the patient was in the dialysis center, and treatment about 2 hours when she started having generalized seizure associated with a frothy mouth and bowel incontinence.  The EMS was called and patient was brought to the ER. In the ER, the initial blood pressure was 193/92, in room air, afebrile.  The labs showed potassium level 4.4, BUN 63, ionized calcium  0.74, hemoglobin 9.7 which was dropped from earlier 10.9. CT head with no acute finding.  Chest x-ray with mild pulmonary edema and mild cardiomegaly.  The patient was seen by neurologist and plan for MRI tonight. The patient is currently awake but does not recall the event from earlier.  He denies past history of seizure disorder. Receiving calcium  IV in the ER and now admitted for further evaluation. OP HD orders:  [place], TTS, 4 hr, 2k 2.5 ca, EDW 60 kg Last OP HD today 12/31 short HD 2 hr due to witnessed seizure. LUE AVF Mircera 100 mcg 12/13 Hectoral 3 mcg three times per week PTH 318.   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  12/29/2021   Anxiety and depression    Atrial fibrillation (HCC)    Cerebral thrombosis with cerebral infarction 05/24/2021   Cocaine abuse (HCC)    Depression with suicidal ideation    [facility] admission 04/2018   Diabetes mellitus without complication (HCC)    Type II   End stage kidney disease (HCC)    GERD (gastroesophageal reflux disease)     Hyperlipidemia    Hypertension    Impingement syndrome of right shoulder    Migraine headache    Osteoarthritis of right knee 05/24/2021   Pilonidal abscess 05/2013   Pyelonephritis    Right thyroid nodule    Sciatica     Past Surgical History:  Procedure Laterality Date   AV FISTULA PLACEMENT Left 03/17/2023   Procedure: LEFT ARM BRACHIOCEPHALIC ARTERIOVENOUS (AV) FISTULA CREATION;  Surgeon: Lanis Fonda BRAVO, MD;  Location: [facility] OR;  Service: Vascular;  Laterality: Left;   BUBBLE STUDY  01/01/2022   Procedure: BUBBLE STUDY;  Surgeon: Alvan Ronal BRAVO, MD;  Location: [facility] ENDOSCOPY;  Service: Cardiovascular;;   IR FLUORO GUIDE CV LINE RIGHT  03/14/2023   IR US  GUIDE VASC ACCESS RIGHT  03/14/2023   TEE WITHOUT CARDIOVERSION N/A 01/01/2022   Procedure: TRANSESOPHAGEAL ECHOCARDIOGRAM (TEE);  Surgeon: Alvan Ronal BRAVO, MD;  Location: [facility] ENDOSCOPY;  Service: Cardiovascular;  Laterality: N/A;   tubal     TUBAL LIGATION      Family History  Problem Relation Age of Onset   Diabetes Mother    Hypertension Mother    Arthritis Mother    Diabetes Father    Hypertension Father     Social History:  reports that she has been smoking cigarettes. She has been smoking an average of 0.3 packs per day. She has been exposed to tobacco smoke. She has never used smokeless tobacco. She reports current alcohol use. She reports that she does not use drugs.  Allergies: Allergies[1]  Medications: I have reviewed the patient's current medications.   Results for orders placed or performed during the hospital encounter of 09/08/24 (from the past 48 hours)  I-Stat venous blood gas, ED     Status: Abnormal   Collection Time: 09/08/24  2:55 PM  Result Value Ref Range   pH, Ven 7.519 (H) 7.25 - 7.43   pCO2, Ven 33.4 (L) 44 - 60 mmHg   pO2, Ven 50 (H) 32 - 45 mmHg   Bicarbonate 27.2 20.0 - 28.0 mmol/L   TCO2 28 22 - 32 mmol/L   O2 Saturation 89 %   Acid-Base Excess 4.0 (H) 0.0 - 2.0 mmol/L   Sodium 136  135 - 145 mmol/L   Potassium 4.5 3.5 - 5.1 mmol/L   Calcium , Ion 0.74 (LL) 1.15 - 1.40 mmol/L   HCT 32.0 (L) 36.0 - 46.0 %   Hemoglobin 10.9 (L) 12.0 - 15.0 g/dL   Sample type VENOUS    Comment NOTIFIED PHYSICIAN   I-Stat CG4 Lactic Acid     Status: None   Collection Time: 09/08/24  3:02 PM  Result Value Ref Range   Lactic Acid, Venous 1.9 0.5 - 1.9 mmol/L  CBC     Status: Abnormal   Collection Time: 09/08/24  3:04 PM  Result Value Ref Range   WBC 11.7 (H) 4.0 - 10.5 K/uL   RBC 3.65 (L) 3.87 - 5.11 MIL/uL   Hemoglobin 9.7 (L) 12.0 - 15.0 g/dL   HCT 71.3 (L) 63.9 - 53.9 %   MCV 78.4 (L) 80.0 - 100.0 fL   MCH 26.6 26.0 - 34.0 pg   MCHC 33.9 30.0 - 36.0 g/dL   RDW 83.0 (H) 88.4 - 84.4 %   Platelets 178 150 - 400 K/uL   nRBC 0.0 0.0 - 0.2 %    Comment: Performed at [facility], [address], [place], [place] [address]  Comprehensive metabolic panel     Status: Abnormal   Collection Time: 09/08/24  3:04 PM  Result Value Ref Range   Sodium 138 135 - 145 mmol/L   Potassium 4.4 3.5 - 5.1 mmol/L    Comment: HEMOLYSIS AT THIS LEVEL MAY AFFECT RESULT   Chloride 94 (L) 98 - 111 mmol/L   CO2 25 22 - 32 mmol/L   Glucose, Bld 160 (H) 70 - 99 mg/dL    Comment: Glucose reference range applies only to samples taken after fasting for at least 8 hours.   BUN 63 (H) 6 - 20 mg/dL   Creatinine, Ser 88.99 (H) 0.44 - 1.00 mg/dL   Calcium  7.4 (L) 8.9 - 10.3 mg/dL   Total Protein 7.7 6.5 - 8.1 g/dL   Albumin  4.2 3.5 - 5.0 g/dL   AST 40 15 - 41 U/L    Comment: Hemolysis at this level may affect result    ALT 15 0 - 44 U/L    Comment: Hemolysis at this level may affect result    Alkaline Phosphatase 92 38 - 126 U/L   Total Bilirubin 0.6 0.0 - 1.2 mg/dL   GFR, Estimated 4 (L) >60 mL/min    Comment: (NOTE) Calculated using the CKD-EPI Creatinine Equation (2021)    Anion gap 19 (H) 5 - 15    Comment: Performed at [facility], [address], [place], [place] [address]    DG  Chest Port 1 View Result Date: 09/08/2024 EXAM: 1 VIEW(S) XRAY OF THE CHEST 09/08/2024 03:49:00 PM COMPARISON:  08/06/2024 CLINICAL HISTORY: ams FINDINGS: LUNGS AND PLEURA: Prominence of the central pulmonary vasculature with bilateral interstitial opacities concerning for mild pulmonary edema. No pleural effusion. No pneumothorax. HEART AND MEDIASTINUM: Similar appearance of mild cardiomegaly. No acute abnormality of the mediastinal silhouette. BONES AND SOFT TISSUES: No acute osseous abnormality. IMPRESSION: 1. Prominence of the central pulmonary vasculature with bilateral interstitial opacities, concerning for mild pulmonary edema. 2. Mild cardiomegaly. Electronically signed by: Donnice Mania MD 09/08/2024 04:39 PM EST RP Workstation: [id number]   CT Head Wo Contrast Result Date: 09/08/2024 EXAM: CT HEAD WITHOUT CONTRAST 09/08/2024 03:24:31 PM TECHNIQUE: CT of the head was performed without the administration of intravenous contrast. Automated exposure control, iterative reconstruction, and/or weight based adjustment of the mA/kV was utilized to reduce the radiation dose to as low as reasonably achievable. COMPARISON: None available. CLINICAL HISTORY: Mental status change, unknown cause FINDINGS: BRAIN AND VENTRICLES: No acute hemorrhage. No evidence of acute infarct. Moderate chronic small vessel ischemic disease. Remote bilateral basal ganglia infarcts, left greater than right. Intracranial arterial calcification. No hydrocephalus. No extra-axial collection. No mass effect or midline shift. ORBITS: No acute abnormality. SINUSES: Remote left lamina papyracea fracture. SOFT TISSUES AND SKULL: No acute soft tissue abnormality. No skull fracture. IMPRESSION: 1. No acute intracranial abnormality. Electronically signed by: Morgane Naveau MD 09/08/2024 03:39 PM EST RP Workstation: [id number]    ROS: As per H&P, rest of systems reviewed and negative. Blood pressure (!) 193/92, pulse 98, temperature 97.9 F  (36.6 C), temperature source Oral, resp. rate 19, height 5' 5 (1.651 m), weight 61.9 kg, last menstrual period 10/11/2019, SpO2 96%. Gen: NAD, comfortable Respiratory: Clear bilateral, no wheezing or crackle Cardiovascular: Regular rate rhythm S1-S2 normal, no rubs GI: Abdomen soft, nontender, nondistended Extremities, no cyanosis, no edema Skin: No rash or ulcer Neurology: Alert, awake, following commands Dialysis Access: Left upper extremity AV fistula has good thrill and bruit.  Assessment/Plan:  # Generalized seizure, new onset: CT scan with no acute finding, getting MRI.  Seen by neurologist.  # ESRD TTS: She had around 2 hours of treatment today as per [facility] schedule outpatient.  Volume status and labs are acceptable therefore no urgent need for HD tonight.  We will plan for short treatment tomorrow.  # Hypertension: Blood pressure elevated, she was not compliant with her outpatient treatment and medication.  Recommend to resume home medication including amlodipine , hydralazine  and monitor BP.  Volume acceptable.  # Anemia of ESRD: Hemoglobin 10.9 on arrival.  Monitor hemoglobin, receives mircera as outpatient.  # Metabolic Bone Disease, secondary hyperparathyroidism, hyperphosphatemia, hypocalcemia: Received calcium  in the ER.  Resume Hectorol.  Check phosphorus level.  Thank you for the consult.  Our team will continue to follow.  Floriene Jeschke Amelie Romney 09/08/2024, 6:37 PM      [1]  Allergies Allergen Reactions   Neurontin  [Gabapentin ] Other (See Comments)    Weakness  Balance impairment

## 2024-09-09 ENCOUNTER — Observation Stay (HOSPITAL_COMMUNITY)

## 2024-09-09 DIAGNOSIS — E1122 Type 2 diabetes mellitus with diabetic chronic kidney disease: Secondary | ICD-10-CM | POA: Diagnosis present

## 2024-09-09 DIAGNOSIS — Z7901 Long term (current) use of anticoagulants: Secondary | ICD-10-CM | POA: Diagnosis not present

## 2024-09-09 DIAGNOSIS — Z66 Do not resuscitate: Secondary | ICD-10-CM | POA: Diagnosis present

## 2024-09-09 DIAGNOSIS — Z59819 Housing instability, housed unspecified: Secondary | ICD-10-CM | POA: Diagnosis not present

## 2024-09-09 DIAGNOSIS — K219 Gastro-esophageal reflux disease without esophagitis: Secondary | ICD-10-CM | POA: Diagnosis present

## 2024-09-09 DIAGNOSIS — E785 Hyperlipidemia, unspecified: Secondary | ICD-10-CM | POA: Diagnosis present

## 2024-09-09 DIAGNOSIS — I480 Paroxysmal atrial fibrillation: Secondary | ICD-10-CM | POA: Diagnosis present

## 2024-09-09 DIAGNOSIS — N2581 Secondary hyperparathyroidism of renal origin: Secondary | ICD-10-CM | POA: Diagnosis present

## 2024-09-09 DIAGNOSIS — D631 Anemia in chronic kidney disease: Secondary | ICD-10-CM | POA: Diagnosis present

## 2024-09-09 DIAGNOSIS — I69351 Hemiplegia and hemiparesis following cerebral infarction affecting right dominant side: Secondary | ICD-10-CM | POA: Diagnosis not present

## 2024-09-09 DIAGNOSIS — I4892 Unspecified atrial flutter: Secondary | ICD-10-CM | POA: Diagnosis not present

## 2024-09-09 DIAGNOSIS — G40409 Other generalized epilepsy and epileptic syndromes, not intractable, without status epilepticus: Secondary | ICD-10-CM | POA: Diagnosis present

## 2024-09-09 DIAGNOSIS — I3139 Other pericardial effusion (noninflammatory): Secondary | ICD-10-CM | POA: Diagnosis present

## 2024-09-09 DIAGNOSIS — F17210 Nicotine dependence, cigarettes, uncomplicated: Secondary | ICD-10-CM | POA: Diagnosis present

## 2024-09-09 DIAGNOSIS — R569 Unspecified convulsions: Secondary | ICD-10-CM | POA: Diagnosis present

## 2024-09-09 DIAGNOSIS — Z992 Dependence on renal dialysis: Secondary | ICD-10-CM | POA: Diagnosis not present

## 2024-09-09 DIAGNOSIS — F419 Anxiety disorder, unspecified: Secondary | ICD-10-CM | POA: Diagnosis present

## 2024-09-09 DIAGNOSIS — R011 Cardiac murmur, unspecified: Secondary | ICD-10-CM | POA: Diagnosis present

## 2024-09-09 DIAGNOSIS — F331 Major depressive disorder, recurrent, moderate: Secondary | ICD-10-CM | POA: Diagnosis present

## 2024-09-09 DIAGNOSIS — Z794 Long term (current) use of insulin: Secondary | ICD-10-CM | POA: Diagnosis not present

## 2024-09-09 DIAGNOSIS — Z79899 Other long term (current) drug therapy: Secondary | ICD-10-CM | POA: Diagnosis not present

## 2024-09-09 DIAGNOSIS — I1311 Hypertensive heart and chronic kidney disease without heart failure, with stage 5 chronic kidney disease, or end stage renal disease: Secondary | ICD-10-CM | POA: Diagnosis present

## 2024-09-09 DIAGNOSIS — N186 End stage renal disease: Secondary | ICD-10-CM | POA: Diagnosis present

## 2024-09-09 LAB — GLUCOSE, CAPILLARY
Glucose-Capillary: 329 mg/dL — ABNORMAL HIGH (ref 70–99)
Glucose-Capillary: 350 mg/dL — ABNORMAL HIGH (ref 70–99)
Glucose-Capillary: 197 mg/dL — ABNORMAL HIGH (ref 70–99)

## 2024-09-09 LAB — HEMOGLOBIN A1C
Hgb A1c MFr Bld: 6.5 % — ABNORMAL HIGH (ref 4.8–5.6)
Mean Plasma Glucose: 139.85 mg/dL

## 2024-09-09 LAB — CBC
HCT: 27.4 % — ABNORMAL LOW (ref 36.0–46.0)
Hemoglobin: 9.2 g/dL — ABNORMAL LOW (ref 12.0–15.0)
MCH: 26.7 pg (ref 26.0–34.0)
MCHC: 33.6 g/dL (ref 30.0–36.0)
MCV: 79.4 fL — ABNORMAL LOW (ref 80.0–100.0)
Platelets: 186 K/uL (ref 150–400)
RBC: 3.45 MIL/uL — ABNORMAL LOW (ref 3.87–5.11)
RDW: 16.6 % — ABNORMAL HIGH (ref 11.5–15.5)
WBC: 10.1 K/uL (ref 4.0–10.5)
nRBC: 0 % (ref 0.0–0.2)

## 2024-09-09 LAB — RENAL FUNCTION PANEL
Albumin: 3.7 g/dL (ref 3.5–5.0)
Anion gap: 22 — ABNORMAL HIGH (ref 5–15)
BUN: 73 mg/dL — ABNORMAL HIGH (ref 6–20)
CO2: 20 mmol/L — ABNORMAL LOW (ref 22–32)
Calcium: 7.5 mg/dL — ABNORMAL LOW (ref 8.9–10.3)
Chloride: 95 mmol/L — ABNORMAL LOW (ref 98–111)
Creatinine, Ser: 12.7 mg/dL — ABNORMAL HIGH (ref 0.44–1.00)
GFR, Estimated: 3 mL/min — ABNORMAL LOW
Glucose, Bld: 204 mg/dL — ABNORMAL HIGH (ref 70–99)
Phosphorus: 6.8 mg/dL — ABNORMAL HIGH (ref 2.5–4.6)
Potassium: 4.9 mmol/L (ref 3.5–5.1)
Sodium: 138 mmol/L (ref 135–145)

## 2024-09-09 LAB — IRON AND TIBC
Iron: 134 ug/dL (ref 28–170)
Saturation Ratios: 63 % — ABNORMAL HIGH (ref 10.4–31.8)
TIBC: 214 ug/dL — ABNORMAL LOW (ref 250–450)
UIBC: 80 ug/dL

## 2024-09-09 LAB — CBG MONITORING, ED
Glucose-Capillary: 101 mg/dL — ABNORMAL HIGH (ref 70–99)
Glucose-Capillary: 84 mg/dL (ref 70–99)

## 2024-09-09 LAB — ECHOCARDIOGRAM COMPLETE
Est EF: 55
Height: 65 in
S' Lateral: 3 cm
Weight: 2183.44 [oz_av]

## 2024-09-09 LAB — VITAMIN B12: Vitamin B-12: 607 pg/mL (ref 180–914)

## 2024-09-09 LAB — FOLATE: Folate: 8.9 ng/mL

## 2024-09-09 LAB — FERRITIN: Ferritin: 1324 ng/mL — ABNORMAL HIGH (ref 11–307)

## 2024-09-09 LAB — HEPATITIS B SURFACE ANTIGEN: Hepatitis B Surface Ag: NONREACTIVE

## 2024-09-09 MED ORDER — INSULIN ASPART 100 UNIT/ML IJ SOLN
0.0000 [IU] | Freq: Three times a day (TID) | INTRAMUSCULAR | Status: DC
  Administered 2024-09-09: 7 [IU] via SUBCUTANEOUS
  Administered 2024-09-10: 2 [IU] via SUBCUTANEOUS
  Administered 2024-09-10: 3 [IU] via SUBCUTANEOUS
  Filled 2024-09-09: qty 2
  Filled 2024-09-09: qty 7

## 2024-09-09 MED ORDER — SERTRALINE HCL 50 MG PO TABS
75.0000 mg | ORAL_TABLET | Freq: Every day | ORAL | Status: DC
  Administered 2024-09-09 – 2024-09-11 (×3): 75 mg via ORAL
  Filled 2024-09-09 (×3): qty 2

## 2024-09-09 MED ORDER — CALCIUM GLUCONATE-NACL 1-0.675 GM/50ML-% IV SOLN
1.0000 g | Freq: Once | INTRAVENOUS | Status: AC
  Administered 2024-09-09: 1000 mg via INTRAVENOUS
  Filled 2024-09-09: qty 50

## 2024-09-09 MED ORDER — HYDROCODONE-ACETAMINOPHEN 5-325 MG PO TABS: 1.0000 | ORAL_TABLET | ORAL | Status: DC | PRN

## 2024-09-09 MED ORDER — LIDOCAINE-PRILOCAINE 2.5-2.5 % EX CREA: 1.0000 | TOPICAL_CREAM | CUTANEOUS | Status: DC | PRN

## 2024-09-09 MED ORDER — ATORVASTATIN CALCIUM 40 MG PO TABS
40.0000 mg | ORAL_TABLET | Freq: Every day | ORAL | Status: DC
  Administered 2024-09-09 – 2024-09-11 (×3): 40 mg via ORAL
  Filled 2024-09-09 (×3): qty 1

## 2024-09-09 MED ORDER — ALTEPLASE 2 MG IJ SOLR: 2.0000 mg | Freq: Once | INTRAMUSCULAR | Status: DC | PRN

## 2024-09-09 MED ORDER — SODIUM CHLORIDE 0.9 % IV SOLN: 250.0000 mL | INTRAVENOUS | Status: AC | PRN

## 2024-09-09 MED ORDER — HEPARIN SODIUM (PORCINE) 1000 UNIT/ML DIALYSIS: 1000.0000 [IU] | INTRAMUSCULAR | Status: DC | PRN

## 2024-09-09 MED ORDER — ACETAMINOPHEN 650 MG RE SUPP: 650.0000 mg | Freq: Four times a day (QID) | RECTAL | Status: DC | PRN

## 2024-09-09 MED ORDER — APIXABAN 2.5 MG PO TABS: 2.5000 mg | ORAL_TABLET | Freq: Two times a day (BID) | ORAL | Status: DC

## 2024-09-09 MED ORDER — ISOSORBIDE MONONITRATE ER 30 MG PO TB24
30.0000 mg | ORAL_TABLET | Freq: Every day | ORAL | Status: DC
  Administered 2024-09-09 – 2024-09-11 (×3): 30 mg via ORAL
  Filled 2024-09-09 (×3): qty 1

## 2024-09-09 MED ORDER — TRAZODONE HCL 50 MG PO TABS
50.0000 mg | ORAL_TABLET | Freq: Every day | ORAL | Status: DC
  Administered 2024-09-09 – 2024-09-10 (×2): 50 mg via ORAL
  Filled 2024-09-09 (×2): qty 1

## 2024-09-09 MED ORDER — ACETAMINOPHEN 325 MG PO TABS: 650.0000 mg | ORAL_TABLET | Freq: Four times a day (QID) | ORAL | Status: DC | PRN

## 2024-09-09 MED ORDER — SODIUM CHLORIDE 0.9% FLUSH
3.0000 mL | Freq: Two times a day (BID) | INTRAVENOUS | Status: DC
  Administered 2024-09-09 – 2024-09-11 (×4): 3 mL via INTRAVENOUS

## 2024-09-09 MED ORDER — ANTICOAGULANT SODIUM CITRATE 4% (200MG/5ML) IV SOLN: 5.0000 mL | Status: DC | PRN

## 2024-09-09 MED ORDER — PENTAFLUOROPROP-TETRAFLUOROETH EX AERO: 1.0000 | INHALATION_SPRAY | CUTANEOUS | Status: DC | PRN

## 2024-09-09 MED ORDER — FENTANYL CITRATE (PF) 50 MCG/ML IJ SOSY: 12.5000 ug | PREFILLED_SYRINGE | INTRAMUSCULAR | Status: DC | PRN

## 2024-09-09 MED ORDER — PANTOPRAZOLE SODIUM 40 MG PO TBEC
40.0000 mg | DELAYED_RELEASE_TABLET | Freq: Every day | ORAL | Status: DC
  Administered 2024-09-09 – 2024-09-11 (×3): 40 mg via ORAL
  Filled 2024-09-09 (×3): qty 1

## 2024-09-09 MED ORDER — SODIUM CHLORIDE 0.9% FLUSH: 3.0000 mL | INTRAVENOUS | Status: DC | PRN

## 2024-09-09 MED ORDER — CARVEDILOL 6.25 MG PO TABS
6.2500 mg | ORAL_TABLET | Freq: Two times a day (BID) | ORAL | Status: DC
  Administered 2024-09-09 – 2024-09-10 (×3): 6.25 mg via ORAL
  Filled 2024-09-09 (×3): qty 1

## 2024-09-09 MED ORDER — APIXABAN 5 MG PO TABS
5.0000 mg | ORAL_TABLET | Freq: Two times a day (BID) | ORAL | Status: DC
  Administered 2024-09-09 – 2024-09-11 (×4): 5 mg via ORAL
  Filled 2024-09-09 (×4): qty 1

## 2024-09-09 MED ORDER — LIDOCAINE HCL (PF) 1 % IJ SOLN: 5.0000 mL | INTRAMUSCULAR | Status: DC | PRN

## 2024-09-09 NOTE — Progress Notes (Signed)
 Pt. Came in awake and alert with no complaints Consent verified and on file. Started with no complaints  UF removal: 0ml Tx duration: 2 hours   Access used: Left AVF Access issue: None  During HD, pt. HR is high. Complained of Chest discomfort. Seen by Dr. Jerrye. Ordered to turn UF off and decrease UF goal to 0.5L. Hooked to 02 at 2LPM. Turned only UF on if HR goes down.  Tx completed and tolerated.  Pressure dressing applied. Endorsed to floor nurse. Transported to room.  Correna Meacham Rubi Hermina Barnard,RN [facility]

## 2024-09-09 NOTE — Consult Note (Addendum)
 "  Cardiology Consultation   Patient ID: [name] MRN: [id number]; DOB: 29-Nov-1975  Admit date: 09/08/2024 Date of Consult: 09/09/2024  PCP:  Stacey Clam, MD   [facility] Providers Cardiologist:  None      Patient Profile: Stacey Lucas is a 49 y.o. female with a hx of ESRD on HD, paroxysmal atrial fibrillation, diabetes, hypertension, hyperlipidemia, cocaine abuse who is being seen 09/09/2024 for the evaluation of pericardial effusion at the request of Dr Stacey Lucas.  History of Present Illness: Stacey Lucas is a 49 year old female with past medical history noted above.  He has had previous cardiology visit scheduled in the office but has no-showed for several of these.  Echocardiogram 06/2023 with LVEF of 55 to 60%, no regional wall motion abnormality, moderate concentric LVH, normal RV function with severely dilated left atrium.  Presented to the ED on 12/31 from dialysis after a reported seizure.  Notes indicate she received approximately 1 hour of dialysis prior to seizure-like episode.  Blood pressure and route with EMS was 188/90 with a heart rate of 100 and sats of 96% on room air.  In the ED labs showed sodium 138, potassium 4, creatinine 11, lactic acid 1.9, WBC 11.7, hemoglobin 9.7.  CT head negative.  Chest x-ray with mild pulmonary edema.  Brain MRI negative.  She was seen by nephrology with no urgent needs for HD.  Plan for short treatment 1/1.  Admitted to internal medicine for further management.  Echocardiogram 1/1 with LVEF of 55%, no regional wall motion malady, severe asymmetric LVH of the basal septal and inferior lateral segments, normal RV, severe left atrial enlargement, moderate pericardial effusion without evidence of tamponade.  Cardiology now asked to evaluate.  In review of telemetry appears patient now in atrial flutter with RVR with rates in the 120s as of 10:40 AM.  Overall she is without complaint other than feeling confused.   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  12/29/2021   Anxiety and depression    Atrial fibrillation (HCC)    Cerebral thrombosis with cerebral infarction 05/24/2021   Cocaine abuse (HCC)    Depression with suicidal ideation    [facility] admission 04/2018   Diabetes mellitus without complication (HCC)    Type II   End stage kidney disease (HCC)    GERD (gastroesophageal reflux disease)    Hyperlipidemia    Hypertension    Impingement syndrome of right shoulder    Migraine headache    Osteoarthritis of right knee 05/24/2021   Pilonidal abscess 05/2013   Pyelonephritis    Right thyroid nodule    Sciatica     Past Surgical History:  Procedure Laterality Date   AV FISTULA PLACEMENT Left 03/17/2023   Procedure: LEFT ARM BRACHIOCEPHALIC ARTERIOVENOUS (AV) FISTULA CREATION;  Surgeon: Stacey Fonda BRAVO, MD;  Location: [facility] OR;  Service: Vascular;  Laterality: Left;   BUBBLE STUDY  01/01/2022   Procedure: BUBBLE STUDY;  Surgeon: Stacey Ronal BRAVO, MD;  Location: [facility] ENDOSCOPY;  Service: Cardiovascular;;   IR FLUORO GUIDE CV LINE RIGHT  03/14/2023   IR US  GUIDE VASC ACCESS RIGHT  03/14/2023   TEE WITHOUT CARDIOVERSION N/A 01/01/2022   Procedure: TRANSESOPHAGEAL ECHOCARDIOGRAM (TEE);  Surgeon: Stacey Ronal BRAVO, MD;  Location: [facility] ENDOSCOPY;  Service: Cardiovascular;  Laterality: N/A;   tubal     TUBAL LIGATION     Scheduled Meds:  amLODipine   10 mg Oral Daily   apixaban   5 mg Oral BID   carvedilol   6.25 mg Oral BID WC   Chlorhexidine  Gluconate Cloth  6 each Topical Q0600   doxercalciferol  2 mcg Intravenous Q T,Th,Sa-HD   insulin  aspart  0-9 Units Subcutaneous Q4H   insulin  glargine  4 Units Subcutaneous Daily   nicotine   21 mg Transdermal Daily   Continuous Infusions:  anticoagulant sodium citrate      PRN Meds: alteplase , anticoagulant sodium citrate , heparin , lidocaine  (PF), lidocaine -prilocaine ,  pentafluoroprop-tetrafluoroeth  Allergies:   Allergies[1]  Social History:   Social History   Socioeconomic History   Marital status: Single    Spouse name: Not on file   Number of children: 3   Years of education: Not on file   Highest education level: Not on file  Occupational History   Not on file  Tobacco Use   Smoking status: Every Day    Current packs/day: 0.00    Average packs/day: 0.3 packs/day    Types: Cigarettes    Last attempt to quit: 01/16/2022    Years since quitting: 2.6    Passive exposure: Current   Smokeless tobacco: Never  Vaping Use   Vaping status: Never Used  Substance and Sexual Activity   Alcohol use: Yes    Comment: Socially   Drug use: No    Comment: Denies despite h/o cocaine use   Sexual activity: Yes    Birth control/protection: None  Other Topics Concern   Not on file  Social History Narrative   Patient reports she lives with her boyfriend. She currently is disabled and doesn't work.    Social Drivers of Health   Tobacco Use: High Risk (09/08/2024)   Patient History    Smoking Tobacco Use: Every Day    Smokeless Tobacco Use: Never    Passive Exposure: Current  Financial Resource Strain: Low Risk (06/23/2024)   Received from [facility]   Overall Financial Resource Strain (CARDIA)    How hard is it for you to pay for the very basics like food, housing, medical care, and heating?: Not very hard  Food Insecurity: Food Insecurity Present (08/06/2024)   Epic    Worried About Programme Researcher, Broadcasting/film/video in the Last Year: Often true    Ran Out of Food in the Last Year: Often true  Transportation Needs: Unmet Transportation Needs (08/06/2024)   Epic    Lack of Transportation (Medical): Yes    Lack of Transportation (Non-Medical): Yes  Physical Activity: Not on file  Stress: Stress Concern Present (03/20/2023)   [employer] of Occupational Health - Occupational Stress Questionnaire    Feeling of Stress : Rather much  Social  Connections: Moderately Integrated (02/17/2024)   Social Connection and Isolation Panel    Frequency of Communication with Friends and Family: Once a week    Frequency of Social Gatherings with Friends and Family: Twice a week    Attends Religious Services: Never    Database Administrator or Organizations: Yes    Attends Banker Meetings: Never    Marital Status: Living with partner  Intimate Partner Violence: Not At Risk (08/06/2024)   Epic    Fear of Current or Ex-Partner: No    Emotionally Abused: No    Physically Abused: No    Sexually Abused: No  Depression (PHQ2-9): Low Risk (02/17/2024)   Depression (PHQ2-9)    PHQ-2 Score: 0  Alcohol Screen: Not on file  Housing: High Risk (  08/06/2024)   Epic    Unable to Pay for Housing in the Last Year: Yes    Number of Times Moved in the Last Year: 1    Homeless in the Last Year: Yes  Utilities: At Risk (08/06/2024)   Epic    Threatened with loss of utilities: Yes  Health Literacy: Not on file    Family History:    Family History  Problem Relation Age of Onset   Diabetes Mother    Hypertension Mother    Arthritis Mother    Diabetes Father    Hypertension Father      ROS:  Please see the history of present illness.   All other ROS reviewed and negative.     Physical Exam/Data: Vitals:   09/09/24 0700 09/09/24 1000 09/09/24 1035 09/09/24 1035  BP: (!) 167/90 (!) 163/87 (!) 163/87   Pulse: 90 90    Resp: 19 11    Temp: 97.9 F (36.6 C)   98.3 F (36.8 C)  TempSrc: Oral   Oral  SpO2: 100% 100%    Weight:      Height:       No intake or output data in the 24 hours ending 09/09/24 1315    09/08/2024    2:22 PM 08/13/2024   12:11 PM 08/13/2024    7:54 AM  Last 3 Weights  Weight (lbs) 136 lb 7.4 oz 136 lb 7.4 oz 139 lb 12.4 oz  Weight (kg) 61.9 kg 61.9 kg 63.4 kg     Body mass index is 22.71 kg/m.  General:  Well nourished, well developed, in no acute distress HEENT: normal Neck: no JVD Vascular: No  carotid bruits; Distal pulses 2+ bilaterally Cardiac:  normal S1, S2; tachycardic; no murmur  Lungs:  clear to auscultation bilaterally, no wheezing, rhonchi or rales  Abd: soft, nontender, no hepatomegaly  Ext: no edema Musculoskeletal:  No deformities, BUE and BLE strength normal and equal Skin: warm and dry  Neuro:  no focal abnormalities noted Psych:  Normal affect   EKG:  The EKG was personally reviewed and demonstrates:  No EKG Telemetry:  Telemetry was personally reviewed and demonstrates:  Sinus Rhythm into atrial flutter with RVR rates 120s  Relevant CV Studies:  Echo: 09/09/2024  IMPRESSIONS     1. Left ventricular ejection fraction, by estimation, is 55%. The left  ventricle has normal function. The left ventricle has no regional wall  motion abnormalities. There is severe asymmetric left ventricular  hypertrophy of the basal-septal and  infero-lateral segments. Indeterminate diastolic filling due to E-A  fusion.   2. Right ventricular systolic function is normal. The right ventricular  size is normal. Tricuspid regurgitation signal is inadequate for assessing  PA pressure.   3. The interatrial septum buldges into the right atrium consistent with  increased left atrial pressure. Left atrial size was severely dilated.   4. Moderate pericardial effusion. The pericardial effusion is  circumferential. There is no evidence of cardiac tamponade.   5. The mitral valve is normal in structure. No evidence of mitral valve  regurgitation. No evidence of mitral stenosis.   6. The aortic valve is tricuspid. Aortic valve regurgitation is not  visualized. No aortic stenosis is present.   7. The inferior vena cava is normal in size with <50% respiratory  variability, suggesting right atrial pressure of 8 mmHg.   Comparison(s): Changes from prior study are noted. Small-moderate  pericardial effusion, severe LVH now present.   Conclusion(s)/Recommendation(s): No cause  for murmur  identified.   FINDINGS   Left Ventricle: Left ventricular ejection fraction, by estimation, is  55%. The left ventricle has normal function. The left ventricle has no  regional wall motion abnormalities. The left ventricular internal cavity  size was normal in size. There is  severe asymmetric left ventricular hypertrophy of the basal-septal and  infero-lateral segments. Indeterminate diastolic filling due to E-A  fusion.   Right Ventricle: The right ventricular size is normal. No increase in  right ventricular wall thickness. Right ventricular systolic function is  normal. Tricuspid regurgitation signal is inadequate for assessing PA  pressure.   Left Atrium: The interatrial septum buldges into the right atrium  consistent with increased left atrial pressure. Left atrial size was  severely dilated.   Right Atrium: Right atrial size was normal in size.   Pericardium: A moderately sized pericardial effusion is present. The  pericardial effusion is circumferential. There is no evidence of cardiac  tamponade.   Mitral Valve: The mitral valve is normal in structure. No evidence of  mitral valve regurgitation. No evidence of mitral valve stenosis.   Tricuspid Valve: The tricuspid valve is normal in structure. Tricuspid  valve regurgitation is trivial. No evidence of tricuspid stenosis.   Aortic Valve: The aortic valve is tricuspid. Aortic valve regurgitation is  not visualized. No aortic stenosis is present.   Pulmonic Valve: The pulmonic valve was normal in structure. Pulmonic valve  regurgitation is not visualized. No evidence of pulmonic stenosis.   Aorta: The aortic root and ascending aorta are structurally normal, with  no evidence of dilitation.   Venous: The inferior vena cava is normal in size with less than 50%  respiratory variability, suggesting right atrial pressure of 8 mmHg.    Laboratory Data: High Sensitivity Troponin:  No results for input(s): TROPONINIHS  in the last 720 hours. No results for input(s): TRNPT in the last 720 hours.    Chemistry Recent Labs  Lab 09/08/24 1504 09/08/24 2301 09/08/24 2316  NA 138 139 138  K 4.4 4.0 3.8  CL 94* 95*  --   CO2 25 26  --   GLUCOSE 160* 113*  --   BUN 63* 66*  --   CREATININE 11.00* 11.70*  --   CALCIUM  7.4* 7.6*  --   MG  --  2.3  --   GFRNONAA 4* 4*  --   ANIONGAP 19* 18*  --     Recent Labs  Lab 09/08/24 1504  PROT 7.7  ALBUMIN  4.2  AST 40  ALT 15  ALKPHOS 92  BILITOT 0.6   Lipids No results for input(s): CHOL, TRIG, HDL, LABVLDL, LDLCALC, CHOLHDL in the last 168 hours.  Hematology Recent Labs  Lab 09/08/24 1455 09/08/24 1504 09/08/24 2304 09/08/24 2316  WBC  --  11.7*  --   --   RBC  --  3.65* 3.41*  --   HGB 10.9* 9.7*  --  9.9*  HCT 32.0* 28.6*  --  29.0*  MCV  --  78.4*  --   --   MCH  --  26.6  --   --   MCHC  --  33.9  --   --   RDW  --  16.9*  --   --   PLT  --  178  --   --    Thyroid No results for input(s): TSH, FREET4 in the last 168 hours.  BNPNo results for input(s): BNP, PROBNP in the last 168  hours.  DDimer No results for input(s): DDIMER in the last 168 hours.  Radiology/Studies:  ECHOCARDIOGRAM COMPLETE Result Date: 09/09/2024    ECHOCARDIOGRAM REPORT   Patient Name:   Stacey Lucas Date of Exam: 09/09/2024 Medical Rec #:  [id number]      Height:       65.0 in Accession #:    [id number]     Weight:       136.5 lb Date of Birth:  November 23, 1975     BSA:          1.682 m Patient Age:    48 years       BP:           163/87 mmHg Patient Gender: F              HR:           127 bpm. Exam Location:  Inpatient Procedure: 2D Echo, Cardiac Doppler and Color Doppler (Both Spectral and Color            Flow Doppler were utilized during procedure). Indications:    Murmur  History:        Patient has prior history of Echocardiogram examinations, most                 recent 07/07/2023. Stroke, Arrythmias:Atrial Fibrillation,                  Signs/Symptoms:Murmur, Shortness of Breath and Edema; Risk                 Factors:Diabetes, Dyslipidemia, Current Smoker and Hypertension.  Sonographer:    Juliene Rucks Referring Phys: [id number] ANASTASSIA DOUTOVA IMPRESSIONS  1. Left ventricular ejection fraction, by estimation, is 55%. The left ventricle has normal function. The left ventricle has no regional wall motion abnormalities. There is severe asymmetric left ventricular hypertrophy of the basal-septal and infero-lateral segments. Indeterminate diastolic filling due to E-A fusion.  2. Right ventricular systolic function is normal. The right ventricular size is normal. Tricuspid regurgitation signal is inadequate for assessing PA pressure.  3. The interatrial septum buldges into the right atrium consistent with increased left atrial pressure. Left atrial size was severely dilated.  4. Moderate pericardial effusion. The pericardial effusion is circumferential. There is no evidence of cardiac tamponade.  5. The mitral valve is normal in structure. No evidence of mitral valve regurgitation. No evidence of mitral stenosis.  6. The aortic valve is tricuspid. Aortic valve regurgitation is not visualized. No aortic stenosis is present.  7. The inferior vena cava is normal in size with <50% respiratory variability, suggesting right atrial pressure of 8 mmHg. Comparison(s): Changes from prior study are noted. Small-moderate pericardial effusion, severe LVH now present. Conclusion(s)/Recommendation(s): No cause for murmur identified. FINDINGS  Left Ventricle: Left ventricular ejection fraction, by estimation, is 55%. The left ventricle has normal function. The left ventricle has no regional wall motion abnormalities. The left ventricular internal cavity size was normal in size. There is severe asymmetric left ventricular hypertrophy of the basal-septal and infero-lateral segments. Indeterminate diastolic filling due to E-A fusion. Right Ventricle: The right ventricular  size is normal. No increase in right ventricular wall thickness. Right ventricular systolic function is normal. Tricuspid regurgitation signal is inadequate for assessing PA pressure. Left Atrium: The interatrial septum buldges into the right atrium consistent with increased left atrial pressure. Left atrial size was severely dilated. Right Atrium: Right atrial size was normal in size. Pericardium: A moderately sized pericardial effusion is  present. The pericardial effusion is circumferential. There is no evidence of cardiac tamponade. Mitral Valve: The mitral valve is normal in structure. No evidence of mitral valve regurgitation. No evidence of mitral valve stenosis. Tricuspid Valve: The tricuspid valve is normal in structure. Tricuspid valve regurgitation is trivial. No evidence of tricuspid stenosis. Aortic Valve: The aortic valve is tricuspid. Aortic valve regurgitation is not visualized. No aortic stenosis is present. Pulmonic Valve: The pulmonic valve was normal in structure. Pulmonic valve regurgitation is not visualized. No evidence of pulmonic stenosis. Aorta: The aortic root and ascending aorta are structurally normal, with no evidence of dilitation. Venous: The inferior vena cava is normal in size with less than 50% respiratory variability, suggesting right atrial pressure of 8 mmHg. IAS/Shunts: No atrial level shunt detected by color flow Doppler.  LEFT VENTRICLE PLAX 2D LVIDd:         3.90 cm   Diastology LVIDs:         3.00 cm   LV e' medial: 5.70 cm/s LV PW:         1.80 cm LV IVS:        1.40 cm LVOT diam:     1.90 cm LV SV:         34 LV SV Index:   20 LVOT Area:     2.84 cm  RIGHT VENTRICLE RV Basal diam:  3.70 cm RV Mid diam:    3.00 cm LEFT ATRIUM           Index        RIGHT ATRIUM           Index LA diam:      4.70 cm 2.80 cm/m   RA Area:     15.50 cm LA Vol (A2C): 36.0 ml 21.41 ml/m  RA Volume:   36.90 ml  21.94 ml/m LA Vol (A4C): 99.9 ml 59.41 ml/m  AORTIC VALVE LVOT Vmax:   100.00  cm/s LVOT Vmean:  68.100 cm/s LVOT VTI:    0.119 m  AORTA Ao Root diam: 2.70 cm  SHUNTS Systemic VTI:  0.12 m Systemic Diam: 1.90 cm Stacey Lucas Electronically signed by Stacey Lucas Signature Date/Time: 09/09/2024/11:40:34 AM    Final    MR BRAIN WO CONTRAST Result Date: 09/08/2024 EXAM: MRI BRAIN WITHOUT CONTRAST 09/08/2024 07:51:22 PM TECHNIQUE: Multiplanar multisequence MRI of the head/brain was performed without the administration of intravenous contrast. COMPARISON: 11/14/2022 CLINICAL HISTORY: Seizure, new-onset, no history of trauma. FINDINGS: BRAIN AND VENTRICLES: There is an old infarct of the left pons, but no acute infarct. There are a few scattered chronic microhemorrhages. Early confluent hyperintense T2-weighted signal within the cerebral white matter, most commonly due to chronic small vessel disease. No mass. No midline shift. No hydrocephalus. The sella is unremarkable. Normal flow voids. ORBITS: No acute abnormality. SINUSES AND MASTOIDS: No acute abnormality. BONES AND SOFT TISSUES: Normal marrow signal. No acute soft tissue abnormality. IMPRESSION: 1. No acute intracranial abnormality. Electronically signed by: Franky Stanford MD 09/08/2024 08:08 PM EST RP Workstation: [id number]   DG Chest Port 1 View Result Date: 09/08/2024 EXAM: 1 VIEW(S) XRAY OF THE CHEST 09/08/2024 03:49:00 PM COMPARISON: 08/06/2024 CLINICAL HISTORY: ams FINDINGS: LUNGS AND PLEURA: Prominence of the central pulmonary vasculature with bilateral interstitial opacities concerning for mild pulmonary edema. No pleural effusion. No pneumothorax. HEART AND MEDIASTINUM: Similar appearance of mild cardiomegaly. No acute abnormality of the mediastinal silhouette. BONES AND SOFT TISSUES: No acute osseous abnormality. IMPRESSION: 1. Prominence of the central pulmonary  vasculature with bilateral interstitial opacities, concerning for mild pulmonary edema. 2. Mild cardiomegaly. Electronically signed by: Stacey Mania MD  09/08/2024 04:39 PM EST RP Workstation: [id number]   CT Head Wo Contrast Result Date: 09/08/2024 EXAM: CT HEAD WITHOUT CONTRAST 09/08/2024 03:24:31 PM TECHNIQUE: CT of the head was performed without the administration of intravenous contrast. Automated exposure control, iterative reconstruction, and/or weight based adjustment of the mA/kV was utilized to reduce the radiation dose to as low as reasonably achievable. COMPARISON: None available. CLINICAL HISTORY: Mental status change, unknown cause FINDINGS: BRAIN AND VENTRICLES: No acute hemorrhage. No evidence of acute infarct. Moderate chronic small vessel ischemic disease. Remote bilateral basal ganglia infarcts, left greater than right. Intracranial arterial calcification. No hydrocephalus. No extra-axial collection. No mass effect or midline shift. ORBITS: No acute abnormality. SINUSES: Remote left lamina papyracea fracture. SOFT TISSUES AND SKULL: No acute soft tissue abnormality. No skull fracture. IMPRESSION: 1. No acute intracranial abnormality. Electronically signed by: Stacey Naveau MD 09/08/2024 03:39 PM EST RP Workstation: [id number]     Assessment and Plan:  Stacey Lucas is a 49 y.o. female with a hx of ESRD on HD, paroxysmal atrial fibrillation, diabetes, hypertension, hyperlipidemia, cocaine abuse who is being seen 09/09/2024 for the evaluation of cardial effusion at the request of Dr Stacey Lucas.  Moderate pericardial effusion -- Echocardiogram 1/1 LVEF of 55%, no regional wall motion, moderate circumferential pericardial effusion with no evidence of tamponade. -- Suspect this is in the setting of incomplete HD session yesterday -- She is tachycardic but appears to be atrial flutter with RVR (known history of atrial fibrillation), blood pressure stable, actually hypertensive at times. -- No signs of hemodynamic compromise on exam  New onset Atrial flutter with RVR Hx of paroxysmal atrial fibrillation  -- History of paroxysmal atrial  fibrillation, maintained on Eliquis  2.5 mg twice daily -- In review of telemetry appears that patient went into atrial flutter with RVR around 10:40 AM -- Will obtain EKG to confirm -- Continue Eliquis  but increase to 5mg  BID (monitor weight) and Coreg  6.25mg  BID. May need to consider DCCV pending HR response, may need TEE if there is question of medication compliance.   Seizure -- CT head, Brain MRI negative -- neurology consulted by primary  ESRD on HD -- TTS schedule -- nephrology following   Per primary DM Hx of cocaine abuse   Risk Assessment/Risk Scores:  CHA2DS2-VASc Score = 5  This indicates a 7.2% annual risk of stroke. The patient's score is based upon: CHF History: 0 HTN History: 1 Diabetes History: 1 Stroke History: 2 Vascular Disease History: 0 Age Score: 0 Gender Score: 1  For questions or updates, please contact [facility] Please consult [email or web address] for contact info under   Signed, Manuelita Rummer, NP  09/09/2024 1:15 PM   Patient seen and examined, note reviewed with the signed Advanced Practice Provider. I personally reviewed laboratory data, imaging studies and relevant notes. I independently examined the patient and formulated the important aspects of the plan. I have personally discussed the plan with the patient and/or family. Comments or changes to the note/plan are indicated below.  Patient profile: This is a 49 year old female who is a poor historian with past medical history of ESRD on HD, paroxysmal atrial fibrillation on renally dosed Eliquis , diabetes, hypertension, hyperlipidemia, cocaine abuse who presented to the ED on 12/31 from dialysis after seizure-like activity.  Upon EMS arrival she was hypertensive and tachycardic satting well on room air.  Cardiology was consulted due to  an abnormal echocardiogram which showed a moderate pericardial effusion with normal systolic function and severe asymmetric left ventricular hypertrophy of  basal septal and inferolateral segments.  She is resting comfortably at bedside.  She is unsure of the events that led her to come to the hospital.  She is not sure which physician is managing her atrial fibrillation and Eliquis .  Denies any complaints at this time.   My Exam:  Physical Exam Vitals and nursing note reviewed.  Constitutional:      Appearance: Normal appearance.  HENT:     Head: Normocephalic and atraumatic.  Eyes:     Conjunctiva/sclera: Conjunctivae normal.  Cardiovascular:     Rate and Rhythm: Regular rhythm. Tachycardia present.  Pulmonary:     Effort: Pulmonary effort is normal.     Breath sounds: Normal breath sounds.  Musculoskeletal:        General: No swelling or tenderness.  Skin:    Coloration: Skin is not jaundiced or pale.  Neurological:     Mental Status: She is alert.      Telemetry: Normal sinus rhythm and possibly conversion to atrial flutter versus SVT- Personally reviewed EKG: Pending Echo: Ejection fraction roughly 55% with severe LVH and moderate circumferential pericardial effusion- Personally reviewed   Assessment & Plan:  Moderate circumferential pericardial effusion of unclear etiology without tamponade-patient does not require pericardiocentesis at this time.  Prior echo from 06/2023 reports no pericardial effusion however on personal review there appears to be a trace to small effusion ESRD on HD Seizure-like activity-MRI brain negative.  Neurology consulted Paroxysmal atrial fibrillation, possibly new onset atrial flutter with RVR versus SVT currently-restart home carvedilol  and can increase Eliquis  to 5 mg twice daily (age less than 80, weight greater than 60 kg).  Will check an EKG to confirm rhythm Hypertension  Signed, Emeline Calender, DO [place]  [facility]  09/09/2024 1:25 PM         [1]  Allergies Allergen Reactions   Neurontin  [Gabapentin ] Other (See Comments)    Weakness  Balance impairment    "

## 2024-09-09 NOTE — Progress Notes (Signed)
" °   09/09/24 1130  TOC ED Mini Assessment  TOC Time spent with patient (minutes): 15  Admission or Readmission Diverted Yes  Means of departure Car  Choice offered to / list presented to  NA    MOON letter signed and given.    Transition of Care [facility]) - Emergency Department Mini Assessment   Patient Details  Name: Stacey Lucas MRN: [id number] Date of Birth: 1976-09-08  Transition of Care [facility] CM/SW Contact:    [name], RN Phone Number: 09/09/2024, 11:32 AM   Clinical Narrative:  RNCM spoke to patient regarding d/c plan. Patient will return home, mom will transport. No DME at home and no needs at this time. Will continue to follow.  ED Mini Assessment:          Means of departure: (P) Car       Patient Contact and Communications        ,              Choice offered to / list presented to : (P) NA  Admission diagnosis:  New onset seizure (HCC) [R56.9] Patient Active Problem List   Diagnosis Date Noted   New onset seizure (HCC) 09/08/2024   Hypocalcemia 09/08/2024   Tobacco abuse 09/08/2024   Cardiac murmur 09/08/2024   Major depressive disorder, recurrent episode, moderate (HCC) 08/08/2024   ESRD needing dialysis (HCC) 08/06/2024   Prolonged QT interval 08/06/2024   Volume overload 11/02/2023   Acute pulmonary edema (HCC) 11/02/2023   Elevated troponin 11/02/2023   Leukocytosis 11/02/2023   SOB (shortness of breath) 11/02/2023   History of anemia due to chronic kidney disease 11/02/2023   Hyperkalemia 11/01/2023   Hypertensive emergency 07/07/2023   Gait disturbance, post-stroke 03/06/2023   ESRD (end stage renal disease) (HCC) 02/04/2023   Polysubstance abuse (HCC) 12/03/2022   Nephrotic syndrome 11/15/2022   Cerebrovascular accident (CVA) (HCC) 11/15/2022   Anemia 01/16/2022   Paroxysmal atrial fibrillation (HCC) 07/04/2021   H/O: stroke 07/04/2021   Osteoarthritis of right knee 05/24/2021   DM2 (diabetes mellitus, type 2) (HCC)  05/23/2021   Essential hypertension 05/23/2021   Hyperlipidemia 05/23/2021   Thyroid nodule 05/23/2021   MDD (major depressive disorder), recurrent severe, without psychosis (HCC) 04/27/2018   PCP:  Delbert Clam, MD Pharmacy:   [id number] [place], [place] - [address] AT [facility] OF [address] [place] [address] Phone: [phone] Fax: [phone]  [facility] - [facility] [address], [address] [place] [address] Phone: [phone] Fax: [phone]   "

## 2024-09-09 NOTE — Progress Notes (Signed)
 Echocardiogram 2D Echocardiogram has been performed.  Stacey Lucas 09/09/2024, 11:25 AM

## 2024-09-09 NOTE — ED Notes (Signed)
Pt tx to dialysis. 

## 2024-09-09 NOTE — Care Management Obs Status (Signed)
 MEDICARE OBSERVATION STATUS NOTIFICATION   Patient Details  Name: Stacey Lucas MRN: [id number] Date of Birth: 06-01-1976   Medicare Observation Status Notification Given:  Yes    [name], RN 09/09/2024, 11:16 AM

## 2024-09-09 NOTE — Progress Notes (Signed)
 " PROGRESS NOTE    Stacey Lucas  FMW:[id number] DOB: 1976-07-14 DOA: 09/08/2024 PCP: Delbert Clam, MD    Brief Narrative:  This 49 y.o. female with medical history significant of ESRD on dialysis MWF,  history of CVA with mild right-sided hemiparesis,, HTN, diabetes type 2, paroxysmal atrial fibrillation on Eliquis , anemia, depression, polysubstance abuse presented in the ED with new onset seizures.  Patient was at hemodialysis today, and after receiving hemodialysis for an hour had a witnessed seizure event that lasted 10 minutes as per staff and is resolved with no postictal.  Patient has no prior history of seizures.  CT head was nonacute but she was found to have low calcium .  Neurology has evaluated the patient recommended MRI brain and correcting serum calcium .  MRI unremarkable.  Nephrology consulted for hypocalcemia leading to seizures and for continuation of hemodialysis.  Patient was recently discharged from nursing home 2 weeks ago.  She lives with her mom who is able to take care of herself.  Patient has not been taking her medications. Patient was admitted for further evaluation.  Assessment & Plan:   Principal Problem:   New onset seizure (HCC) Active Problems:   DM2 (diabetes mellitus, type 2) (HCC)   Hyperlipidemia   Paroxysmal atrial fibrillation (HCC)   H/O: stroke   Anemia   ESRD (end stage renal disease) (HCC)   Major depressive disorder, recurrent episode, moderate (HCC)   Hypocalcemia   Tobacco abuse   Cardiac murmur  New onset seizure (HCC); Patient denies any prior history of seizures. Likely due to hypocalcemia. Neurology consulted recommended MRI and correcting calcium . MRI brain showed no acute intracranial abnormality found. Neurologist has not recommended antiseizure medications.  Paroxysmal atrial fibrillation (HCC) Monitor on telemetry . HR controlled. Continue Eliquis ,  continue Coreg  6.25 mg p.o. twice daily.   DM2 (diabetes mellitus, type 2)  (HCC) Diabetic diet.  Continue sliding scale. Continue Lantus  4 units subcu daily   ESRD (end stage renal disease) (HCC) Appreciate nephrology consult. Continue hemodialysis as per nephrology.   H/O: stroke: Continue Lipitor  when able to tolerate. Continue Eliquis .   Hyperlipidemia: Continue Lipitor  40 mg a day.   Major depressive disorder, recurrent episode, moderate (HCC): Continue Zoloft  75 mg daily. Continue trazodone  50 mg at bedtime.   Hypocalcemia: Serum calcium  replaced and improved.   Anemia: Obtain anemia panel  Transfuse for Hg <7 , rapidly dropping or  if symptomatic.     Tobacco abuse Continue nicotine  patch Smoking cessation counseling completed.    Moderate pericardial Effusion: Echo shows moderate pericardial effusion.  No evidence of tamponade.   Cardiology is consulted.  DVT prophylaxis: SCDs Code Status: Full code Family Communication: No family at bed side Disposition Plan:    Status is: Inpatient Remains inpatient appropriate because: Patient admitted for new onset seizures.  Neurology think seizures are secondary to hypocalcemia which has been corrected. Nephrology is consulted for continuation of hemodialysis. Patient is not medically ready for discharge.   Consultants:  Neurology Nephrology  Procedures: MRI  Antimicrobials:  Anti-infectives (From admission, onward)    None      Subjective: Patient was seen and examined at bedside.  Overnight events noted. Patient has weakness on the right side from prior stroke. Patient reports never had any seizures before. Denies any headache or other symptoms.  Objective: Vitals:   09/09/24 0700 09/09/24 1000 09/09/24 1035 09/09/24 1035  BP: (!) 167/90 (!) 163/87 (!) 163/87   Pulse: 90 90    Resp:  19 11    Temp: 97.9 F (36.6 C)   98.3 F (36.8 C)  TempSrc: Oral   Oral  SpO2: 100% 100%    Weight:      Height:       No intake or output data in the 24 hours ending 09/09/24  1221 Filed Weights   09/08/24 1422  Weight: 61.9 kg    Examination:  General exam: Appears calm and comfortable, not in any acute distress. Respiratory system: Clear to auscultation. Respiratory effort normal.  RR 14 Cardiovascular system: S1 & S2 heard, RRR. No JVD, murmurs, rubs, gallops or clicks.  Gastrointestinal system: Abdomen is non distended, soft and non tender.  Normal bowel sounds heard. Central nervous system: Alert and oriented x 3. Right sided weakness from prior CVA. Extremities: No edema, no cyanosis, no clubbing. Skin: No rashes, lesions or ulcers Psychiatry: Judgement and insight appear normal. Mood & affect appropriate.   Data Reviewed: I have personally reviewed following labs and imaging studies  CBC: Recent Labs  Lab 09/08/24 1455 09/08/24 1504 09/08/24 2316  WBC  --  11.7*  --   HGB 10.9* 9.7* 9.9*  HCT 32.0* 28.6* 29.0*  MCV  --  78.4*  --   PLT  --  178  --    Basic Metabolic Panel: Recent Labs  Lab 09/08/24 1455 09/08/24 1504 09/08/24 2301 09/08/24 2316  NA 136 138 139 138  K 4.5 4.4 4.0 3.8  CL  --  94* 95*  --   CO2  --  25 26  --   GLUCOSE  --  160* 113*  --   BUN  --  63* 66*  --   CREATININE  --  11.00* 11.70*  --   CALCIUM   --  7.4* 7.6*  --   MG  --   --  2.3  --   PHOS  --   --  5.7*  --    GFR: Estimated Creatinine Clearance: 5.2 mL/min (A) (by C-G formula based on SCr of 11.7 mg/dL (H)). Liver Function Tests: Recent Labs  Lab 09/08/24 1504  AST 40  ALT 15  ALKPHOS 92  BILITOT 0.6  PROT 7.7  ALBUMIN  4.2   No results for input(s): LIPASE, AMYLASE in the last 168 hours. No results for input(s): AMMONIA in the last 168 hours. Coagulation Profile: No results for input(s): INR, PROTIME in the last 168 hours. Cardiac Enzymes: Recent Labs  Lab 09/08/24 2301  CKTOTAL 156   BNP (last 3 results) No results for input(s): PROBNP in the last 8760 hours. HbA1C: Recent Labs    09/08/24 2301  HGBA1C 6.5*    CBG: Recent Labs  Lab 09/08/24 2256 09/09/24 0402 09/09/24 0825  GLUCAP 116* 101* 84   Lipid Profile: No results for input(s): CHOL, HDL, LDLCALC, TRIG, CHOLHDL, LDLDIRECT in the last 72 hours. Thyroid Function Tests: No results for input(s): TSH, T4TOTAL, FREET4, T3FREE, THYROIDAB in the last 72 hours. Anemia Panel: Recent Labs    09/08/24 2301 09/08/24 2304 09/09/24 0500  VITAMINB12  --   --  607  FOLATE  --   --  8.9  FERRITIN 1,324*  --   --   TIBC 214*  --   --   IRON  134  --   --   RETICCTPCT  --  0.9  --    Sepsis Labs: Recent Labs  Lab 09/08/24 1502  LATICACIDVEN 1.9    No results found for this or any previous  visit (from the past 240 hours).   Radiology Studies: ECHOCARDIOGRAM COMPLETE Result Date: 09/09/2024    ECHOCARDIOGRAM REPORT   Patient Name:   Stacey Lucas Date of Exam: 09/09/2024 Medical Rec #:  [id number]      Height:       65.0 in Accession #:    [id number]     Weight:       136.5 lb Date of Birth:  June 19, 1976     BSA:          1.682 m Patient Age:    48 years       BP:           163/87 mmHg Patient Gender: F              HR:           127 bpm. Exam Location:  Inpatient Procedure: 2D Echo, Cardiac Doppler and Color Doppler (Both Spectral and Color            Flow Doppler were utilized during procedure). Indications:    Murmur  History:        Patient has prior history of Echocardiogram examinations, most                 recent 07/07/2023. Stroke, Arrythmias:Atrial Fibrillation,                 Signs/Symptoms:Murmur, Shortness of Breath and Edema; Risk                 Factors:Diabetes, Dyslipidemia, Current Smoker and Hypertension.  Sonographer:    Juliene Rucks Referring Phys: [id number] ANASTASSIA DOUTOVA IMPRESSIONS  1. Left ventricular ejection fraction, by estimation, is 55%. The left ventricle has normal function. The left ventricle has no regional wall motion abnormalities. There is severe asymmetric left ventricular hypertrophy of the  basal-septal and infero-lateral segments. Indeterminate diastolic filling due to E-A fusion.  2. Right ventricular systolic function is normal. The right ventricular size is normal. Tricuspid regurgitation signal is inadequate for assessing PA pressure.  3. The interatrial septum buldges into the right atrium consistent with increased left atrial pressure. Left atrial size was severely dilated.  4. Moderate pericardial effusion. The pericardial effusion is circumferential. There is no evidence of cardiac tamponade.  5. The mitral valve is normal in structure. No evidence of mitral valve regurgitation. No evidence of mitral stenosis.  6. The aortic valve is tricuspid. Aortic valve regurgitation is not visualized. No aortic stenosis is present.  7. The inferior vena cava is normal in size with <50% respiratory variability, suggesting right atrial pressure of 8 mmHg. Comparison(s): Changes from prior study are noted. Small-moderate pericardial effusion, severe LVH now present. Conclusion(s)/Recommendation(s): No cause for murmur identified. FINDINGS  Left Ventricle: Left ventricular ejection fraction, by estimation, is 55%. The left ventricle has normal function. The left ventricle has no regional wall motion abnormalities. The left ventricular internal cavity size was normal in size. There is severe asymmetric left ventricular hypertrophy of the basal-septal and infero-lateral segments. Indeterminate diastolic filling due to E-A fusion. Right Ventricle: The right ventricular size is normal. No increase in right ventricular wall thickness. Right ventricular systolic function is normal. Tricuspid regurgitation signal is inadequate for assessing PA pressure. Left Atrium: The interatrial septum buldges into the right atrium consistent with increased left atrial pressure. Left atrial size was severely dilated. Right Atrium: Right atrial size was normal in size. Pericardium: A moderately sized pericardial effusion is  present. The pericardial effusion is circumferential.  There is no evidence of cardiac tamponade. Mitral Valve: The mitral valve is normal in structure. No evidence of mitral valve regurgitation. No evidence of mitral valve stenosis. Tricuspid Valve: The tricuspid valve is normal in structure. Tricuspid valve regurgitation is trivial. No evidence of tricuspid stenosis. Aortic Valve: The aortic valve is tricuspid. Aortic valve regurgitation is not visualized. No aortic stenosis is present. Pulmonic Valve: The pulmonic valve was normal in structure. Pulmonic valve regurgitation is not visualized. No evidence of pulmonic stenosis. Aorta: The aortic root and ascending aorta are structurally normal, with no evidence of dilitation. Venous: The inferior vena cava is normal in size with less than 50% respiratory variability, suggesting right atrial pressure of 8 mmHg. IAS/Shunts: No atrial level shunt detected by color flow Doppler.  LEFT VENTRICLE PLAX 2D LVIDd:         3.90 cm   Diastology LVIDs:         3.00 cm   LV e' medial: 5.70 cm/s LV PW:         1.80 cm LV IVS:        1.40 cm LVOT diam:     1.90 cm LV SV:         34 LV SV Index:   20 LVOT Area:     2.84 cm  RIGHT VENTRICLE RV Basal diam:  3.70 cm RV Mid diam:    3.00 cm LEFT ATRIUM           Index        RIGHT ATRIUM           Index LA diam:      4.70 cm 2.80 cm/m   RA Area:     15.50 cm LA Vol (A2C): 36.0 ml 21.41 ml/m  RA Volume:   36.90 ml  21.94 ml/m LA Vol (A4C): 99.9 ml 59.41 ml/m  AORTIC VALVE LVOT Vmax:   100.00 cm/s LVOT Vmean:  68.100 cm/s LVOT VTI:    0.119 m  AORTA Ao Root diam: 2.70 cm  SHUNTS Systemic VTI:  0.12 m Systemic Diam: 1.90 cm Georganna Archer Electronically signed by Georganna Archer Signature Date/Time: 09/09/2024/11:40:34 AM    Final    MR BRAIN WO CONTRAST Result Date: 09/08/2024 EXAM: MRI BRAIN WITHOUT CONTRAST 09/08/2024 07:51:22 PM TECHNIQUE: Multiplanar multisequence MRI of the head/brain was performed without the administration  of intravenous contrast. COMPARISON: 11/14/2022 CLINICAL HISTORY: Seizure, new-onset, no history of trauma. FINDINGS: BRAIN AND VENTRICLES: There is an old infarct of the left pons, but no acute infarct. There are a few scattered chronic microhemorrhages. Early confluent hyperintense T2-weighted signal within the cerebral white matter, most commonly due to chronic small vessel disease. No mass. No midline shift. No hydrocephalus. The sella is unremarkable. Normal flow voids. ORBITS: No acute abnormality. SINUSES AND MASTOIDS: No acute abnormality. BONES AND SOFT TISSUES: Normal marrow signal. No acute soft tissue abnormality. IMPRESSION: 1. No acute intracranial abnormality. Electronically signed by: Franky Stanford MD 09/08/2024 08:08 PM EST RP Workstation: [id number]   DG Chest Port 1 View Result Date: 09/08/2024 EXAM: 1 VIEW(S) XRAY OF THE CHEST 09/08/2024 03:49:00 PM COMPARISON: 08/06/2024 CLINICAL HISTORY: ams FINDINGS: LUNGS AND PLEURA: Prominence of the central pulmonary vasculature with bilateral interstitial opacities concerning for mild pulmonary edema. No pleural effusion. No pneumothorax. HEART AND MEDIASTINUM: Similar appearance of mild cardiomegaly. No acute abnormality of the mediastinal silhouette. BONES AND SOFT TISSUES: No acute osseous abnormality. IMPRESSION: 1. Prominence of the central pulmonary vasculature with bilateral interstitial opacities, concerning  for mild pulmonary edema. 2. Mild cardiomegaly. Electronically signed by: Donnice Mania MD 09/08/2024 04:39 PM EST RP Workstation: [id number]   CT Head Wo Contrast Result Date: 09/08/2024 EXAM: CT HEAD WITHOUT CONTRAST 09/08/2024 03:24:31 PM TECHNIQUE: CT of the head was performed without the administration of intravenous contrast. Automated exposure control, iterative reconstruction, and/or weight based adjustment of the mA/kV was utilized to reduce the radiation dose to as low as reasonably achievable. COMPARISON: None available.  CLINICAL HISTORY: Mental status change, unknown cause FINDINGS: BRAIN AND VENTRICLES: No acute hemorrhage. No evidence of acute infarct. Moderate chronic small vessel ischemic disease. Remote bilateral basal ganglia infarcts, left greater than right. Intracranial arterial calcification. No hydrocephalus. No extra-axial collection. No mass effect or midline shift. ORBITS: No acute abnormality. SINUSES: Remote left lamina papyracea fracture. SOFT TISSUES AND SKULL: No acute soft tissue abnormality. No skull fracture. IMPRESSION: 1. No acute intracranial abnormality. Electronically signed by: Morgane Naveau MD 09/08/2024 03:39 PM EST RP Workstation: [id number]   Scheduled Meds:  amLODipine   10 mg Oral Daily   Chlorhexidine  Gluconate Cloth  6 each Topical Q0600   doxercalciferol  2 mcg Intravenous Q T,Th,Sa-HD   insulin  aspart  0-9 Units Subcutaneous Q4H   insulin  glargine  4 Units Subcutaneous Daily   nicotine   21 mg Transdermal Daily   Continuous Infusions:  anticoagulant sodium citrate        LOS: 0 days    Time spent: 50 mins    Darcel Dawley, MD [facility]   If 7PM-7AM, please contact night-coverage  "

## 2024-09-09 NOTE — Progress Notes (Signed)
 [facility] Progress Note  Name: Stacey Lucas MRN: [id number] DOB: Jun 10, 1976  Chief Complaint:  Seizure at HD  Subjective:  Seen and examined on dialysis.  Procedure supervised.  Blood pressure 126/67 (improved from 90's systolic) and HR 869.  We took her out of UF because she complained of chest discomfort.  LUE AVF in use.    ----------- Background on consult:  Stacey Lucas is an 49 y.o. female with past medical history significant for hypertension, type 2 diabetes, CVA, A-fib, anxiety depression, dyslipidemia, ESRD on HD TTS who was brought in from dialysis center after a witnessed seizure, seen as a consultation for the management of ESRD. Apparently the patient was in the dialysis center, and treatment about 2 hours when she started having generalized seizure associated with a frothy mouth and bowel incontinence.  The EMS was called and patient was brought to the ER. In the ER, the initial blood pressure was 193/92, in room air, afebrile.  The labs showed potassium level 4.4, BUN 63, ionized calcium  0.74, hemoglobin 9.7 which was dropped from earlier 10.9. CT head with no acute finding.  Chest x-ray with mild pulmonary edema and mild cardiomegaly.  The patient was seen by neurologist and plan for MRI tonight. The patient is currently awake but does not recall the event from earlier.  She denies past history of seizure disorder.  Receiving calcium  IV in the ER and now admitted for further evaluation.  No intake or output data in the 24 hours ending 09/09/24 1433  Vitals:  Vitals:   09/09/24 1035 09/09/24 1300 09/09/24 1346 09/09/24 1401  BP:  129/76 125/86 129/82  Pulse:   (!) 130 (!) 131  Resp:  14 12 13   Temp: 98.3 F (36.8 C)  98.3 F (36.8 C)   TempSrc: Oral     SpO2:  100% 97% 98%  Weight:      Height:         Physical Exam:  General adult female in bed in no acute distress HEENT normocephalic atraumatic extraocular movements intact sclera  anicteric Neck supple trachea midline Lungs clear to auscultation bilaterally normal work of breathing at rest on 2 liters Heart tachycardic no rub appreciated Abdomen soft nontender nondistended Extremities no edema  Psych normal mood and affect Neuro - alert and oriented x 3 provides hx and follows commands  Access LUE AVF in use  Medications reviewed   Labs:     Latest Ref Rng & Units 09/08/2024   11:16 PM 09/08/2024   11:01 PM 09/08/2024    3:04 PM  BMP  Glucose 70 - 99 mg/dL  886  839   BUN 6 - 20 mg/dL  66  63   Creatinine 9.55 - 1.00 mg/dL  88.29  88.99   Sodium 135 - 145 mmol/L 138  139  138   Potassium 3.5 - 5.1 mmol/L 3.8  4.0  4.4   Chloride 98 - 111 mmol/L  95  94   CO2 22 - 32 mmol/L  26  25   Calcium  8.9 - 10.3 mg/dL  7.6  7.4    Outpatient HD orders:  [place], TTS, 4 hr, 2k 2.5 ca, EDW 60 kg Last OP HD today 12/31 short HD 2 hr due to witnessed seizure. LUE AVF Mircera 100 mcg 12/13 Hectoral 3 mcg three times per week PTH 318    Assessment/Plan:   # Generalized seizure, new onset: CT scan with no acute finding, had MRI with  no acute intracranial abnormality.  Seen by neurologist.   # ESRD TTS:  - s/p abbreviated HD - ended early on 12/31 due to witnessed seizure  - Plan for short HD on 1/1 then standard HD per TTS schedule     # Hypertension:  - Blood pressure elevated - she has been started on coreg  for HR  - she is prescribed amlodipine  but isn't actually taking - will discontinue for now to prioritize agents   # Anemia of ESRD:  - Hemoglobin 10.9 on arrival.  Now slightly down.  Monitor hemoglobin, receives mircera as outpatient.     # Metabolic Bone Disease, secondary hyperparathyroidism, hyperphosphatemia, hypocalcemia: Received calcium  in the ER.  Resume Hectorol.     Disposition - per primary team   Stacey JAYSON Saba, MD 09/09/2024 2:56 PM

## 2024-09-09 NOTE — Progress Notes (Signed)
" °   09/09/24 1616  Vitals  Temp 98.3 F (36.8 C)  Pulse Rate (!) 120  Resp 11  BP 139/76  SpO2 100 %  O2 Device Nasal Cannula  Weight (S)   (Unable to obtain)  Type of Weight Post-Dialysis  Oxygen Therapy  O2 Flow Rate (L/min) 2 L/min  Post Treatment  Dialyzer Clearance Clear  Liters Processed 39.8  Fluid Removed (mL) 0 mL  Tolerated HD Treatment Yes  Post-Hemodialysis Comments Pt. has high HR. See flow sheet  AVG/AVF Arterial Site Held (minutes) 10 minutes  AVG/AVF Venous Site Held (minutes) 10 minutes    "

## 2024-09-10 DIAGNOSIS — R569 Unspecified convulsions: Secondary | ICD-10-CM | POA: Diagnosis not present

## 2024-09-10 LAB — GLUCOSE, CAPILLARY
Glucose-Capillary: 128 mg/dL — ABNORMAL HIGH (ref 70–99)
Glucose-Capillary: 101 mg/dL — ABNORMAL HIGH (ref 70–99)
Glucose-Capillary: 168 mg/dL — ABNORMAL HIGH (ref 70–99)
Glucose-Capillary: 222 mg/dL — ABNORMAL HIGH (ref 70–99)
Glucose-Capillary: 190 mg/dL — ABNORMAL HIGH (ref 70–99)

## 2024-09-10 LAB — COMPREHENSIVE METABOLIC PANEL WITH GFR
ALT: 9 U/L (ref 0–44)
AST: 21 U/L (ref 15–41)
Albumin: 3.4 g/dL — ABNORMAL LOW (ref 3.5–5.0)
Alkaline Phosphatase: 83 U/L (ref 38–126)
Anion gap: 15 (ref 5–15)
BUN: 49 mg/dL — ABNORMAL HIGH (ref 6–20)
CO2: 27 mmol/L (ref 22–32)
Calcium: 7 mg/dL — ABNORMAL LOW (ref 8.9–10.3)
Chloride: 97 mmol/L — ABNORMAL LOW (ref 98–111)
Creatinine, Ser: 9.41 mg/dL — ABNORMAL HIGH (ref 0.44–1.00)
GFR, Estimated: 5 mL/min — ABNORMAL LOW
Glucose, Bld: 137 mg/dL — ABNORMAL HIGH (ref 70–99)
Potassium: 4 mmol/L (ref 3.5–5.1)
Sodium: 138 mmol/L (ref 135–145)
Total Bilirubin: 0.3 mg/dL (ref 0.0–1.2)
Total Protein: 5.9 g/dL — ABNORMAL LOW (ref 6.5–8.1)

## 2024-09-10 LAB — CBC
HCT: 21.3 % — ABNORMAL LOW (ref 36.0–46.0)
Hemoglobin: 7.1 g/dL — ABNORMAL LOW (ref 12.0–15.0)
MCH: 26.6 pg (ref 26.0–34.0)
MCHC: 33.3 g/dL (ref 30.0–36.0)
MCV: 79.8 fL — ABNORMAL LOW (ref 80.0–100.0)
Platelets: 157 K/uL (ref 150–400)
RBC: 2.67 MIL/uL — ABNORMAL LOW (ref 3.87–5.11)
RDW: 16.6 % — ABNORMAL HIGH (ref 11.5–15.5)
WBC: 8.5 K/uL (ref 4.0–10.5)
nRBC: 0 % (ref 0.0–0.2)

## 2024-09-10 LAB — MRSA NEXT GEN BY PCR, NASAL: MRSA by PCR Next Gen: NOT DETECTED

## 2024-09-10 LAB — MAGNESIUM: Magnesium: 2.2 mg/dL (ref 1.7–2.4)

## 2024-09-10 LAB — PHOSPHORUS: Phosphorus: 4.2 mg/dL (ref 2.5–4.6)

## 2024-09-10 LAB — HEPATITIS B SURFACE ANTIBODY, QUANTITATIVE: Hep B S AB Quant (Post): 17.6 m[IU]/mL

## 2024-09-10 MED ORDER — CHLORHEXIDINE GLUCONATE CLOTH 2 % EX PADS
6.0000 | MEDICATED_PAD | Freq: Every day | CUTANEOUS | Status: DC
  Administered 2024-09-11: 6 via TOPICAL

## 2024-09-10 MED ORDER — CALCIUM GLUCONATE-NACL 1-0.675 GM/50ML-% IV SOLN
1.0000 g | Freq: Once | INTRAVENOUS | Status: AC
  Administered 2024-09-10: 1000 mg via INTRAVENOUS
  Filled 2024-09-10: qty 50

## 2024-09-10 MED ORDER — DARBEPOETIN ALFA 100 MCG/0.5ML IJ SOSY
100.0000 ug | PREFILLED_SYRINGE | INTRAMUSCULAR | Status: DC
  Administered 2024-09-10: 100 ug via SUBCUTANEOUS
  Filled 2024-09-10: qty 0.5

## 2024-09-10 NOTE — Evaluation (Signed)
 Occupational Therapy Evaluation Patient Details Name: Stacey Lucas MRN: [id number] DOB: [birth year] Today's Date: 09/10/2024   History of Present Illness   Pt is a 49 y/o F who presented to [facility] ED 09/08/24 for reported grand mal seizure at dialysis center. MRI brain negative for acute findings. Workup revealed hypocalcemia. PMHx: ESRD on HD MWF, CVA, DMII, afib, anemia, depression, HTN, HLD, substance abuse.     Clinical Impressions Pt seen for OT eval this AM, agreeable for visit. PTA, she was living with a friend and reports being relatively indep with ADL, primarily getting around in a w/c. Note recent d/c from SNF rehab a few weeks prior to admission. Her goal is to return to independence with IADLs and BADL. Functionally, she presented today needing up to CGA for LB ADLs and standing UB ADLs. CGA for functional transfers and up to min A for ambulation in room via RW. Generally weak; residual RHB paresis from prior CVA.   Pt is currently functioning below baseline and would benefit from ongoing acute OT services to progress towards safe discharge and to facilitate return to prior level of function. Current recommendation is home with home health OT.     If plan is discharge home, recommend the following:   A little help with walking and/or transfers;A little help with bathing/dressing/bathroom;Assistance with cooking/housework;Assist for transportation;Help with stairs or ramp for entrance     Functional Status Assessment   Patient has had a recent decline in their functional status and demonstrates the ability to make significant improvements in function in a reasonable and predictable amount of time.     Equipment Recommendations   Tub/shower bench     Recommendations for Other Services         Precautions/Restrictions   Precautions Precautions: Fall;Other (comment) Recall of Precautions/Restrictions: Intact Precaution/Restrictions Comments:  Seizure Restrictions Weight Bearing Restrictions Per Provider Order: No     Mobility Bed Mobility Overal bed mobility: Modified Independent             General bed mobility comments: Exited to the R, HOB elevated, used rails    Transfers Overall transfer level: Needs assistance Equipment used: Rolling walker (2 wheels) Transfers: Sit to/from Stand Sit to Stand: Contact guard assist           General transfer comment: incr time/effor to fully come to upright posture, demo's good hand placement      Balance Overall balance assessment: Needs assistance Sitting-balance support: Feet supported, No upper extremity supported Sitting balance-Leahy Scale: Good Sitting balance - Comments: seated EOB   Standing balance support: Bilateral upper extremity supported, During functional activity, Reliant on assistive device for balance Standing balance-Leahy Scale: Poor Standing balance comment: reliant on RW, min A at times as she fatigued                           ADL either performed or assessed with clinical judgement   ADL Overall ADL's : Needs assistance/impaired Eating/Feeding: Independent   Grooming: Contact guard assist;Standing;Wash/dry hands Grooming Details (indicate cue type and reason): sinkside Upper Body Bathing: Set up;Sitting   Lower Body Bathing: Contact guard assist;Sitting/lateral leans       Lower Body Dressing: Contact guard assist;Sitting/lateral leans Lower Body Dressing Details (indicate cue type and reason): figure four technique EOB     Toileting- Clothing Manipulation and Hygiene: Contact guard assist       Functional mobility during ADLs: Contact guard assist;Minimal assistance;Rolling walker (2  wheels)       Vision Baseline Vision/History: 0 No visual deficits Ability to See in Adequate Light: 0 Adequate Patient Visual Report: No change from baseline       Perception         Praxis         Pertinent Vitals/Pain  Pain Assessment Pain Assessment: Faces Faces Pain Scale: Hurts a little bit Pain Location: R side Pain Descriptors / Indicators: Discomfort Pain Intervention(s): Limited activity within patient's tolerance, Monitored during session, Repositioned     Extremity/Trunk Assessment Upper Extremity Assessment Upper Extremity Assessment: Right hand dominant;RUE deficits/detail RUE Deficits / Details: prior R sided weakness from prior CVA, grossly 4-/5 to MMT RUE Sensation: WNL RUE Coordination: WNL   Lower Extremity Assessment Lower Extremity Assessment: Defer to PT evaluation   Cervical / Trunk Assessment Cervical / Trunk Assessment: Normal   Communication Communication Communication: No apparent difficulties   Cognition Arousal: Alert Behavior During Therapy: WFL for tasks assessed/performed Cognition: No apparent impairments                               Following commands: Intact       Cueing  General Comments   Cueing Techniques: Verbal cues;Tactile cues  tolerated session well   Exercises     Shoulder Instructions      Home Living Family/patient expects to be discharged to:: Private residence Living Arrangements: Non-relatives/Friends Available Help at Discharge: Friend(s) Type of Home: House Home Access: Stairs to enter [employer] of Steps: 2 STE Entrance Stairs-Rails: None Home Layout: One level     Bathroom Shower/Tub: Chief Strategy Officer: Standard     Home Equipment: Wheelchair - manual (reports she needs a rollator and tub bench)   Additional Comments: Pt reported she may be able to stay with a friend who could help with bathing/dressing. If staying with friend, will have 2 steps to enter with no handrails. Has manual WC. pt reports a recent separation from her spouse      Prior Functioning/Environment Prior Level of Function : Needs assist       Physical Assist : Mobility (physical);ADLs  (physical) Mobility (physical): Stairs ADLs (physical): Toileting;IADLs;Bathing (can be setup for bathing, has been dressing self independently, has assist for all household chores) Mobility Comments: pt reports she has only been getting aroun in her w/c but prior admission notes from Nov 2025 pt ambulated 61' with PT with RW. ADLs Comments: has been trying to do her own medications    OT Problem List: Decreased strength;Decreased range of motion;Decreased activity tolerance;Impaired balance (sitting and/or standing);Impaired UE functional use   OT Treatment/Interventions: Self-care/ADL training;Energy conservation;DME and/or AE instruction;Therapeutic activities;Patient/family education;Balance training      OT Goals(Current goals can be found in the care plan section)   Acute Rehab OT Goals Patient Stated Goal: get back to being independent with everything OT Goal Formulation: With patient Time For Goal Achievement: 09/24/24 Potential to Achieve Goals: Good   OT Frequency:  Min 2X/week    Co-evaluation              AM-PAC OT 6 Clicks Daily Activity     Outcome Measure Help from another person eating meals?: None Help from another person taking care of personal grooming?: A Little Help from another person toileting, which includes using toliet, bedpan, or urinal?: A Little Help from another person bathing (including washing, rinsing, drying)?: A Little Help  from another person to put on and taking off regular upper body clothing?: A Little Help from another person to put on and taking off regular lower body clothing?: A Little 6 Click Score: 19   End of Session Equipment Utilized During Treatment: Gait belt;Rolling walker (2 wheels) Nurse Communication: Mobility status  Activity Tolerance: Patient tolerated treatment well Patient left: in chair;with call bell/phone within reach;with chair alarm set  OT Visit Diagnosis: Unsteadiness on feet (R26.81);Other  abnormalities of gait and mobility (R26.89);Muscle weakness (generalized) (M62.81)                Time: 8850-8788 OT Time Calculation (min): 22 min Charges:  OT General Charges $OT Visit: 1 Visit OT Evaluation $OT Eval Moderate Complexity: 1 Mod  Alvena Kiernan M. [place], OTR/L [facility] Acute Rehabilitation Services [phone] Secure Chat Preferred  Chaska Hagger 09/10/2024, 2:38 PM

## 2024-09-10 NOTE — TOC Initial Note (Addendum)
 Transition of Care [facility]) - Initial/Assessment Note    Patient Details  Name: Stacey Lucas MRN: [id number] Date of Birth: 1976-02-06  Transition of Care [facility]) CM/SW Contact:    Andrez JULIANNA George, RN Phone Number: 09/10/2024, 11:12 AM  Clinical Narrative:                 Stacey Lucas is a 49 y.o. female with medical history significant of ESRD on dialysis MWF history of CVA with mild right-sided hemiparesis,, HTN, diabetes type 2, paroxysmal atrial fibrillation on Eliquis , anemia, depression, polysubstance abuse.  Pt is from home with her mom. Mom is with her most of the time.  Mom provides needed transportation.  Pt admits to noncompliance with medications. CM will speak to her mother to see if she can assist with medications at home. May need [facility] RN visit.  Pt is active with [facility] for PCP.   CM consulted for medication assistance. CM can not assist with pt having medicaid. Her medications are already $3-4.   Awaiting therapy evals. IP Care management following.  1400:   SDOH Interventions Today    Flowsheet Row Most Recent Value  SDOH Interventions   Food Insecurity Interventions [place] Resources Referral, Inpatient TOC  Housing Interventions [employer] [facility] Referral, Inpatient [facility]  Transportation Interventions Inpatient TOC  Utilities Interventions [employer] [facility] Referral, Inpatient [facility]    Transportation through her medicaid added to AVS.  Hedda accepted for [facility] services. Information on the AVS. Hedda will contact her for the first home visit.   Expected Discharge Plan:  (TBD) Barriers to Discharge: Continued Medical Work up    Patient Goals and CMS Choice     Choice offered to / list presented to : NA      Expected Discharge Plan and Services   Discharge Planning Services: CM Consult   Living arrangements for the past 2 months: Single Family Home                                      Prior Living  Arrangements/Services Living arrangements for the past 2 months: Single Family Home Lives with:: Parents Patient language and need for interpreter reviewed:: Yes Do you feel safe going back to the place where you live?: Yes        Care giver support system in place?: Yes (comment)   Criminal Activity/Legal Involvement Pertinent to Current Situation/Hospitalization: No - Comment as needed  Activities of Daily Living      Permission Sought/Granted                  Emotional Assessment Appearance:: Appears stated age Attitude/Demeanor/Rapport: Engaged Affect (typically observed): Accepting Orientation: : Oriented to Self, Oriented to Place Alcohol / Substance Use: Illicit Drugs Psych Involvement: No (comment)  Admission diagnosis:  Seizure (HCC) [R56.9] Paroxysmal atrial fibrillation (HCC) [I48.0] New onset seizure (HCC) [R56.9] Hypertension due to end stage renal disease caused by type 2 diabetes mellitus, on dialysis (HCC) [E11.22, I12.0, Z99.2, N18.6] Patient Active Problem List   Diagnosis Date Noted   New onset seizure (HCC) 09/08/2024   Hypocalcemia 09/08/2024   Tobacco abuse 09/08/2024   Cardiac murmur 09/08/2024   Major depressive disorder, recurrent episode, moderate (HCC) 08/08/2024   ESRD needing dialysis (HCC) 08/06/2024   Prolonged QT interval 08/06/2024   Volume overload 11/02/2023   Acute pulmonary edema (HCC) 11/02/2023   Elevated troponin 11/02/2023  Leukocytosis 11/02/2023   SOB (shortness of breath) 11/02/2023   History of anemia due to chronic kidney disease 11/02/2023   Hyperkalemia 11/01/2023   Hypertensive emergency 07/07/2023   Gait disturbance, post-stroke 03/06/2023   ESRD (end stage renal disease) (HCC) 02/04/2023   Polysubstance abuse (HCC) 12/03/2022   Nephrotic syndrome 11/15/2022   Cerebrovascular accident (CVA) (HCC) 11/15/2022   Anemia 01/16/2022   Paroxysmal atrial fibrillation (HCC) 07/04/2021   H/O: stroke 07/04/2021    Osteoarthritis of right knee 05/24/2021   DM2 (diabetes mellitus, type 2) (HCC) 05/23/2021   Essential hypertension 05/23/2021   Hyperlipidemia 05/23/2021   Thyroid nodule 05/23/2021   MDD (major depressive disorder), recurrent severe, without psychosis (HCC) 04/27/2018   PCP:  Delbert Clam, MD Pharmacy:   [facility] [phone] - , [place] - [address] AT [facility] OF [address] & [address] [place] [address] Phone: [phone] Fax: [phone]  [facility] - [facility] [address], [address] [place] [address] Phone: [phone] Fax: [phone]     Social Drivers of Health (SDOH) Social History: SDOH Screenings   Food Insecurity: Food Insecurity Present (08/06/2024)  Housing: High Risk (08/06/2024)  Transportation Needs: Unmet Transportation Needs (08/06/2024)  Utilities: At Risk (08/06/2024)  Depression (PHQ2-9): Low Risk (02/17/2024)  Financial Resource Strain: Low Risk (06/23/2024)   Received from [facility]  Social Connections: Moderately Integrated (02/17/2024)  Stress: Stress Concern Present (03/20/2023)  Tobacco Use: High Risk (09/08/2024)   SDOH Interventions:     Readmission Risk Interventions     No data to display

## 2024-09-10 NOTE — Progress Notes (Signed)
"  °  Progress Note  Patient Name: AUDRA KAGEL Date of Encounter: 09/10/2024 [facility] Cardiologist: Emeline FORBES Calender, DO   Interval Summary   No new events. No CP currently. Had some CP with HD  Vital Signs Vitals:   09/09/24 1703 09/09/24 1959 09/10/24 0031 09/10/24 0425  BP: 136/68 120/63 (!) 133/57 130/61  Pulse: (!) 117 94 92 82  Resp: 16 17 18 17   Temp: 98.5 F (36.9 C) 97.8 F (36.6 C) 97.6 F (36.4 C) 98.1 F (36.7 C)  TempSrc: Oral Oral Oral Oral  SpO2: 100% 100% 100% 100%  Weight:      Height:        Intake/Output Summary (Last 24 hours) at 09/10/2024 0659 Last data filed at 09/09/2024 1825 Gross per 24 hour  Intake 711 ml  Output 0 ml  Net 711 ml      09/09/2024    4:16 PM 09/08/2024    2:22 PM 08/13/2024   12:11 PM  Last 3 Weights  Weight (lbs) --  136 lb 7.4 oz 136 lb 7.4 oz  Weight (kg) --  61.9 kg 61.9 kg     Significant value      Telemetry/ECG  No adverse rhythms - Personally Reviewed  Physical Exam  GEN: No acute distress.   Neck: No JVD Cardiac: RRR, no murmurs, rubs, or gallops.  Respiratory: Clear to auscultation bilaterally. GI: Soft, nontender, non-distended  MS: No edema  Assessment & Plan   49 year old with end-stage renal disease on hemodialysis with seizure-like activity being seen for moderate pericardial effusion  Mild to Moderate pericardial effusion - Echocardiogram reviewed.  No tamponade.  May be related to underlying renal disease.  Even back in October 2024 there appeared to be a small effusion although not commented on in report.  No need for further intervention.  Prolonged QT - EKG demonstrates prolonged QT interval.  Be careful with QT prolonging medications.  Telemetry has not shown any evidence of torsades or ventricular tachycardia.  Paroxysmal atrial fibrillation/possible flutter - Recommended to restart home carvedilol  yesterday and increase Eliquis  to 5 mg twice a day.  EKG showed sinus  rhythm.  LVH/hypertension - Medications reviewed.  Will sign off. Please call if any ?  For questions or updates, please contact [facility] Please consult [email or web address] for contact info under       Signed, Oneil Parchment, MD   "

## 2024-09-10 NOTE — Progress Notes (Signed)
 Physical Therapy Treatment Patient Details Name: Stacey Lucas MRN: [id number] DOB: 1975-12-16 Today's Date: 09/10/2024   History of Present Illness Pt is a 49 y/o F who presented to [facility] ED 09/08/24 for reported grand mal seizure at dialysis center. MRI brain negative for acute findings. Workup revealed hypocalcemia. PMHx: ESRD on HD MWF, CVA, DMII, afib, anemia, depression, HTN, HLD, substance abuse.    PT Comments  Pt presents with admitting diagnosis above. Co-treat with OT. Pt today was able to ambulate to the sink and back to the recliner with RW Min A. PTA pt reports she just recently discharged from SNF a few weeks ago and is currently living with a friend. Pt states that she worked on short distance ambulation at rehab however was mostly WC level. Recommend HHPT upon DC. PT will continue to follow.     If plan is discharge home, recommend the following: A little help with walking and/or transfers;A little help with bathing/dressing/bathroom;Assist for transportation;Help with stairs or ramp for entrance;Supervision due to cognitive status   Can travel by private vehicle        Equipment Recommendations  Rolling walker (2 wheels)    Recommendations for Other Services       Precautions / Restrictions Precautions Precautions: Fall;Other (comment) Recall of Precautions/Restrictions: Intact Precaution/Restrictions Comments: Seizure Restrictions Weight Bearing Restrictions Per Provider Order: No     Mobility  Bed Mobility Overal bed mobility: Modified Independent                  Transfers Overall transfer level: Needs assistance Equipment used: Rolling walker (2 wheels) Transfers: Sit to/from Stand Sit to Stand: Contact guard assist           General transfer comment: CGA for safety    Ambulation/Gait Ambulation/Gait assistance: Min assist Gait Distance (Feet): 20 Feet Assistive device: Rolling walker (2 wheels) Gait Pattern/deviations: Decreased stride  length, Step-through pattern, Decreased dorsiflexion - right, Knee flexed in stance - right Gait velocity: decreased     General Gait Details: Pt today was able to ambulate to the sink and back to the recliner with RW Min A. Cues for proximity to RW. Pt noted with decreased dorsiflexion on R foot.   Stairs             Wheelchair Mobility     Tilt Bed    Modified Rankin (Stroke Patients Only)       Balance Overall balance assessment: Needs assistance Sitting-balance support: Single extremity supported, Bilateral upper extremity supported, No upper extremity supported, Feet supported Sitting balance-Leahy Scale: Good     Standing balance support: Bilateral upper extremity supported, During functional activity Standing balance-Leahy Scale: Poor Standing balance comment: reliant on RW                            Communication Communication Communication: No apparent difficulties  Cognition Arousal: Alert Behavior During Therapy: WFL for tasks assessed/performed                             Following commands: Intact      Cueing Cueing Techniques: Verbal cues, Tactile cues  Exercises      General Comments General comments (skin integrity, edema, etc.): VSS      Pertinent Vitals/Pain Pain Assessment Pain Assessment: Faces Faces Pain Scale: Hurts a little bit Pain Location: R side Pain Descriptors / Indicators: Discomfort  Home Living Family/patient expects to be discharged to:: Private residence Living Arrangements: Non-relatives/Friends Available Help at Discharge: Friend(s) (she's like my Momma - a friend) Type of Home: House Home Access: Stairs to enter Entrance Stairs-Rails: None Entrance Stairs-Number of Steps: 2 STE   Home Layout: One level Home Equipment: Wheelchair - manual (reports she needs a rollator and a tub bench)      Prior Function            PT Goals (current goals can now be found in the care plan  section) Acute Rehab PT Goals Patient Stated Goal: to go home PT Goal Formulation: With patient Time For Goal Achievement: 09/24/24 Potential to Achieve Goals: Good    Frequency    Min 2X/week      PT Plan      Co-evaluation              AM-PAC PT 6 Clicks Mobility   Outcome Measure  Help needed turning from your back to your side while in a flat bed without using bedrails?: None Help needed moving from lying on your back to sitting on the side of a flat bed without using bedrails?: None Help needed moving to and from a bed to a chair (including a wheelchair)?: A Little Help needed standing up from a chair using your arms (e.g., wheelchair or bedside chair)?: A Little Help needed to walk in hospital room?: A Little Help needed climbing 3-5 steps with a railing? : A Lot 6 Click Score: 19    End of Session Equipment Utilized During Treatment: Gait belt Activity Tolerance: Patient tolerated treatment well Patient left: in chair;with call bell/phone within reach;with chair alarm set Nurse Communication: Mobility status PT Visit Diagnosis: Other abnormalities of gait and mobility (R26.89)     Time: 8851-8788 PT Time Calculation (min) (ACUTE ONLY): 23 min  Charges:      PT General Charges $$ ACUTE PT VISIT: 1 Visit                     [name], PT, [name] Acute Rehab Services [phone]    Stacey Lucas 09/10/2024, 12:39 PM

## 2024-09-10 NOTE — Plan of Care (Signed)
   Problem: Coping: Goal: Ability to adjust to condition or change in health will improve Outcome: Progressing   Problem: Fluid Volume: Goal: Ability to maintain a balanced intake and output will improve Outcome: Progressing   Problem: Health Behavior/Discharge Planning: Goal: Ability to manage health-related needs will improve Outcome: Progressing   Problem: Nutritional: Goal: Maintenance of adequate nutrition will improve Outcome: Progressing

## 2024-09-10 NOTE — Progress Notes (Signed)
 " PROGRESS NOTE    Stacey Lucas  FMW:[id number] DOB: 27-Feb-1976 DOA: 09/08/2024 PCP: Delbert Clam, MD    Brief Narrative:  This 49 y.o. female with medical history significant of ESRD on dialysis MWF,  history of CVA with mild right-sided hemiparesis,, HTN, diabetes type 2, paroxysmal atrial fibrillation on Eliquis , anemia, depression, polysubstance abuse presented in the ED with new onset seizures.  Patient was at hemodialysis today, and after receiving hemodialysis for an hour had a witnessed seizure event that lasted 10 minutes as per staff and is resolved with no postictal.  Patient has no prior history of seizures.  CT head was nonacute but she was found to have low calcium .  Neurology has evaluated the patient recommended MRI brain and correcting serum calcium .  MRI unremarkable.  Nephrology consulted for hypocalcemia leading to seizures and for continuation of hemodialysis.  Patient was recently discharged from nursing home 2 weeks ago.  She lives with her mom who is able to take care of herself.  Patient has not been taking her medications. Patient was admitted for further evaluation.  Assessment & Plan:   Principal Problem:   New onset seizure (HCC) Active Problems:   DM2 (diabetes mellitus, type 2) (HCC)   Hyperlipidemia   Paroxysmal atrial fibrillation (HCC)   H/O: stroke   Anemia   ESRD (end stage renal disease) (HCC)   Major depressive disorder, recurrent episode, moderate (HCC)   Hypocalcemia   Tobacco abuse   Cardiac murmur  New onset seizure (HCC); Patient denies any prior history of seizures. Likely due to hypocalcemia. Neurology consulted recommended MRI and correcting calcium . MRI brain showed no acute intracranial abnormality found. Neurologist has not recommended antiseizure medications. Calcium  improved.  Paroxysmal atrial fibrillation (HCC) Monitor on telemetry . HR controlled. Continue Eliquis ,  continue Coreg  6.25 mg p.o. twice daily.   DM2 (diabetes  mellitus, type 2) (HCC) Diabetic diet.  Continue sliding scale. Continue Lantus  4 units subcu daily   ESRD (end stage renal disease) (HCC) Appreciate nephrology consult. Continue hemodialysis as per nephrology. She is back on schedule now.   H/O: stroke: Continue Lipitor  when able to tolerate. Continue Eliquis .   Hyperlipidemia: Continue Lipitor  40 mg a day.   Major depressive disorder, recurrent episode, moderate (HCC): Continue Zoloft  75 mg daily. Continue trazodone  50 mg at bedtime.   Hypocalcemia: Serum calcium  replaced and improved.   Anemia: Hb dropped from 9.2 >7.1.  There is no any obvious visible bleeding. Transfuse for Hg <7 , rapidly dropping or  if symptomatic.   Tobacco abuse Continue nicotine  patch Smoking cessation counseling completed.    Moderate pericardial Effusion: Echo shows moderate pericardial effusion.  No evidence of tamponade.   Cardiology is consulted.  No further intervention needed, its just trace pericardial effusion.  DVT prophylaxis: SCDs Code Status: Full code Family Communication: No family at bed side Disposition Plan:    Status is: Inpatient Remains inpatient appropriate because: Patient admitted for new onset seizures.  Neurology think seizures are secondary to hypocalcemia which has been corrected. Nephrology is consulted for continuation of hemodialysis. Patient is not medically ready for discharge.   Consultants:  Neurology Nephrology  Procedures: MRI  Antimicrobials:  Anti-infectives (From admission, onward)    None      Subjective: Patient was seen and examined at bedside.  Overnight events noted. Patient has weakness on the right side from prior stroke. Denies any headache or other symptoms.  Objective: Vitals:   09/10/24 0031 09/10/24 0425 09/10/24 9150 09/10/24  1147  BP: (!) 133/57 130/61 (!) 144/76 (!) 140/64  Pulse: 92 82 82 79  Resp: 18 17 19 19   Temp: 97.6 F (36.4 C) 98.1 F (36.7 C) 98.2 F (36.8  C) 98.2 F (36.8 C)  TempSrc: Oral Oral Oral Oral  SpO2: 100% 100% 100% 99%  Weight:      Height:        Intake/Output Summary (Last 24 hours) at 09/10/2024 1352 Last data filed at 09/09/2024 1825 Gross per 24 hour  Intake 711 ml  Output 0 ml  Net 711 ml   Filed Weights   09/08/24 1422  Weight: 61.9 kg    Examination:  General exam: Appears calm and comfortable, not in any acute distress. Respiratory system: CTA Bilaterally. RRR. No JVD, murmurs, rubs, gallops or clicks.  Gastrointestinal system: Abdomen is non distended, soft and non tender.  Normal bowel sounds heard. Central nervous system: Alert and oriented x 3. Right sided weakness from prior CVA. Extremities: No edema, no cyanosis, no clubbing. Skin: No rashes, lesions or ulcers Psychiatry: Judgement and insight appear normal. Mood & affect appropriate.   Data Reviewed: I have personally reviewed following labs and imaging studies  CBC: Recent Labs  Lab 09/08/24 1455 09/08/24 1504 09/08/24 2316 09/09/24 1402 09/10/24 0317  WBC  --  11.7*  --  10.1 8.5  HGB 10.9* 9.7* 9.9* 9.2* 7.1*  HCT 32.0* 28.6* 29.0* 27.4* 21.3*  MCV  --  78.4*  --  79.4* 79.8*  PLT  --  178  --  186 157   Basic Metabolic Panel: Recent Labs  Lab 09/08/24 1504 09/08/24 2301 09/08/24 2316 09/09/24 1402 09/10/24 0317  NA 138 139 138 138 138  K 4.4 4.0 3.8 4.9 4.0  CL 94* 95*  --  95* 97*  CO2 25 26  --  20* 27  GLUCOSE 160* 113*  --  204* 137*  BUN 63* 66*  --  73* 49*  CREATININE 11.00* 11.70*  --  12.70* 9.41*  CALCIUM  7.4* 7.6*  --  7.5* 7.0*  MG  --  2.3  --   --  2.2  PHOS  --  5.7*  --  6.8* 4.2   GFR: Estimated Creatinine Clearance: 6.5 mL/min (A) (by C-G formula based on SCr of 9.41 mg/dL (H)). Liver Function Tests: Recent Labs  Lab 09/08/24 1504 09/09/24 1402 09/10/24 0317  AST 40  --  21  ALT 15  --  9  ALKPHOS 92  --  83  BILITOT 0.6  --  0.3  PROT 7.7  --  5.9*  ALBUMIN  4.2 3.7 3.4*   No results for  input(s): LIPASE, AMYLASE in the last 168 hours. No results for input(s): AMMONIA in the last 168 hours. Coagulation Profile: No results for input(s): INR, PROTIME in the last 168 hours. Cardiac Enzymes: Recent Labs  Lab 09/08/24 2301  CKTOTAL 156   BNP (last 3 results) No results for input(s): PROBNP in the last 8760 hours. HbA1C: Recent Labs    09/08/24 2301  HGBA1C 6.5*   CBG: Recent Labs  Lab 09/09/24 1933 09/09/24 2322 09/10/24 0349 09/10/24 0637 09/10/24 1105  GLUCAP 350* 197* 128* 101* 168*   Lipid Profile: No results for input(s): CHOL, HDL, LDLCALC, TRIG, CHOLHDL, LDLDIRECT in the last 72 hours. Thyroid Function Tests: No results for input(s): TSH, T4TOTAL, FREET4, T3FREE, THYROIDAB in the last 72 hours. Anemia Panel: Recent Labs    09/08/24 2301 09/08/24 2304 09/09/24 0500  VITAMINB12  --   --  607  FOLATE  --   --  8.9  FERRITIN 1,324*  --   --   TIBC 214*  --   --   IRON  134  --   --   RETICCTPCT  --  0.9  --    Sepsis Labs: Recent Labs  Lab 09/08/24 1502  LATICACIDVEN 1.9    No results found for this or any previous visit (from the past 240 hours).   Radiology Studies: ECHOCARDIOGRAM COMPLETE Result Date: 09/09/2024    ECHOCARDIOGRAM REPORT   Patient Name:   RONAE NOELL Date of Exam: 09/09/2024 Medical Rec #:  [id number]      Height:       65.0 in Accession #:    [id number]     Weight:       136.5 lb Date of Birth:  1975/12/07     BSA:          1.682 m Patient Age:    48 years       BP:           163/87 mmHg Patient Gender: F              HR:           127 bpm. Exam Location:  Inpatient Procedure: 2D Echo, Cardiac Doppler and Color Doppler (Both Spectral and Color            Flow Doppler were utilized during procedure). Indications:    Murmur  History:        Patient has prior history of Echocardiogram examinations, most                 recent 07/07/2023. Stroke, Arrythmias:Atrial Fibrillation,                  Signs/Symptoms:Murmur, Shortness of Breath and Edema; Risk                 Factors:Diabetes, Dyslipidemia, Current Smoker and Hypertension.  Sonographer:    Juliene Rucks Referring Phys: [id number] ANASTASSIA DOUTOVA IMPRESSIONS  1. Left ventricular ejection fraction, by estimation, is 55%. The left ventricle has normal function. The left ventricle has no regional wall motion abnormalities. There is severe asymmetric left ventricular hypertrophy of the basal-septal and infero-lateral segments. Indeterminate diastolic filling due to E-A fusion.  2. Right ventricular systolic function is normal. The right ventricular size is normal. Tricuspid regurgitation signal is inadequate for assessing PA pressure.  3. The interatrial septum buldges into the right atrium consistent with increased left atrial pressure. Left atrial size was severely dilated.  4. Moderate pericardial effusion. The pericardial effusion is circumferential. There is no evidence of cardiac tamponade.  5. The mitral valve is normal in structure. No evidence of mitral valve regurgitation. No evidence of mitral stenosis.  6. The aortic valve is tricuspid. Aortic valve regurgitation is not visualized. No aortic stenosis is present.  7. The inferior vena cava is normal in size with <50% respiratory variability, suggesting right atrial pressure of 8 mmHg. Comparison(s): Changes from prior study are noted. Small-moderate pericardial effusion, severe LVH now present. Conclusion(s)/Recommendation(s): No cause for murmur identified. FINDINGS  Left Ventricle: Left ventricular ejection fraction, by estimation, is 55%. The left ventricle has normal function. The left ventricle has no regional wall motion abnormalities. The left ventricular internal cavity size was normal in size. There is severe asymmetric left ventricular hypertrophy of the basal-septal and infero-lateral segments. Indeterminate diastolic filling due to E-A fusion. Right Ventricle: The right ventricular  size is normal. No increase in right ventricular wall thickness. Right ventricular systolic function is normal. Tricuspid regurgitation signal is inadequate for assessing PA pressure. Left Atrium: The interatrial septum buldges into the right atrium consistent with increased left atrial pressure. Left atrial size was severely dilated. Right Atrium: Right atrial size was normal in size. Pericardium: A moderately sized pericardial effusion is present. The pericardial effusion is circumferential. There is no evidence of cardiac tamponade. Mitral Valve: The mitral valve is normal in structure. No evidence of mitral valve regurgitation. No evidence of mitral valve stenosis. Tricuspid Valve: The tricuspid valve is normal in structure. Tricuspid valve regurgitation is trivial. No evidence of tricuspid stenosis. Aortic Valve: The aortic valve is tricuspid. Aortic valve regurgitation is not visualized. No aortic stenosis is present. Pulmonic Valve: The pulmonic valve was normal in structure. Pulmonic valve regurgitation is not visualized. No evidence of pulmonic stenosis. Aorta: The aortic root and ascending aorta are structurally normal, with no evidence of dilitation. Venous: The inferior vena cava is normal in size with less than 50% respiratory variability, suggesting right atrial pressure of 8 mmHg. IAS/Shunts: No atrial level shunt detected by color flow Doppler.  LEFT VENTRICLE PLAX 2D LVIDd:         3.90 cm   Diastology LVIDs:         3.00 cm   LV e' medial: 5.70 cm/s LV PW:         1.80 cm LV IVS:        1.40 cm LVOT diam:     1.90 cm LV SV:         34 LV SV Index:   20 LVOT Area:     2.84 cm  RIGHT VENTRICLE RV Basal diam:  3.70 cm RV Mid diam:    3.00 cm LEFT ATRIUM           Index        RIGHT ATRIUM           Index LA diam:      4.70 cm 2.80 cm/m   RA Area:     15.50 cm LA Vol (A2C): 36.0 ml 21.41 ml/m  RA Volume:   36.90 ml  21.94 ml/m LA Vol (A4C): 99.9 ml 59.41 ml/m  AORTIC VALVE LVOT Vmax:   100.00  cm/s LVOT Vmean:  68.100 cm/s LVOT VTI:    0.119 m  AORTA Ao Root diam: 2.70 cm  SHUNTS Systemic VTI:  0.12 m Systemic Diam: 1.90 cm Georganna Archer Electronically signed by Georganna Archer Signature Date/Time: 09/09/2024/11:40:34 AM    Final    MR BRAIN WO CONTRAST Result Date: 09/08/2024 EXAM: MRI BRAIN WITHOUT CONTRAST 09/08/2024 07:51:22 PM TECHNIQUE: Multiplanar multisequence MRI of the head/brain was performed without the administration of intravenous contrast. COMPARISON: 11/14/2022 CLINICAL HISTORY: Seizure, new-onset, no history of trauma. FINDINGS: BRAIN AND VENTRICLES: There is an old infarct of the left pons, but no acute infarct. There are a few scattered chronic microhemorrhages. Early confluent hyperintense T2-weighted signal within the cerebral white matter, most commonly due to chronic small vessel disease. No mass. No midline shift. No hydrocephalus. The sella is unremarkable. Normal flow voids. ORBITS: No acute abnormality. SINUSES AND MASTOIDS: No acute abnormality. BONES AND SOFT TISSUES: Normal marrow signal. No acute soft tissue abnormality. IMPRESSION: 1. No acute intracranial abnormality. Electronically signed by: Franky Stanford MD 09/08/2024 08:08 PM EST RP Workstation: [id number]   DG Chest Port 1 View Result Date: 09/08/2024 EXAM: 1 VIEW(S) XRAY OF THE  CHEST 09/08/2024 03:49:00 PM COMPARISON: 08/06/2024 CLINICAL HISTORY: ams FINDINGS: LUNGS AND PLEURA: Prominence of the central pulmonary vasculature with bilateral interstitial opacities concerning for mild pulmonary edema. No pleural effusion. No pneumothorax. HEART AND MEDIASTINUM: Similar appearance of mild cardiomegaly. No acute abnormality of the mediastinal silhouette. BONES AND SOFT TISSUES: No acute osseous abnormality. IMPRESSION: 1. Prominence of the central pulmonary vasculature with bilateral interstitial opacities, concerning for mild pulmonary edema. 2. Mild cardiomegaly. Electronically signed by: Donnice Mania MD  09/08/2024 04:39 PM EST RP Workstation: [id number]   CT Head Wo Contrast Result Date: 09/08/2024 EXAM: CT HEAD WITHOUT CONTRAST 09/08/2024 03:24:31 PM TECHNIQUE: CT of the head was performed without the administration of intravenous contrast. Automated exposure control, iterative reconstruction, and/or weight based adjustment of the mA/kV was utilized to reduce the radiation dose to as low as reasonably achievable. COMPARISON: None available. CLINICAL HISTORY: Mental status change, unknown cause FINDINGS: BRAIN AND VENTRICLES: No acute hemorrhage. No evidence of acute infarct. Moderate chronic small vessel ischemic disease. Remote bilateral basal ganglia infarcts, left greater than right. Intracranial arterial calcification. No hydrocephalus. No extra-axial collection. No mass effect or midline shift. ORBITS: No acute abnormality. SINUSES: Remote left lamina papyracea fracture. SOFT TISSUES AND SKULL: No acute soft tissue abnormality. No skull fracture. IMPRESSION: 1. No acute intracranial abnormality. Electronically signed by: Morgane Naveau MD 09/08/2024 03:39 PM EST RP Workstation: [id number]   Scheduled Meds:  apixaban   5 mg Oral BID   atorvastatin   40 mg Oral Daily   carvedilol   6.25 mg Oral BID WC   Chlorhexidine  Gluconate Cloth  6 each Topical Q0600   darbepoetin (ARANESP ) injection - DIALYSIS  100 mcg Subcutaneous Q Fri-1800   doxercalciferol  2 mcg Intravenous Q T,Th,Sa-HD   insulin  aspart  0-9 Units Subcutaneous TID WC   insulin  glargine  4 Units Subcutaneous Daily   isosorbide  mononitrate  30 mg Oral Daily   nicotine   21 mg Transdermal Daily   pantoprazole   40 mg Oral Daily   sertraline   75 mg Oral Daily   sodium chloride  flush  3 mL Intravenous Q12H   traZODone   50 mg Oral QHS   Continuous Infusions:  sodium chloride      calcium  gluconate 1,000 mg (09/10/24 1327)     LOS: 1 day    Time spent: 35 mins    Darcel Dawley, MD [facility]   If 7PM-7AM, please  contact night-coverage  "

## 2024-09-10 NOTE — Progress Notes (Signed)
 [facility] Progress Note  Name: CHEALSEY MIYAMOTO MRN: [id number] DOB: Jul 04, 1976  Chief Complaint:  Seizure at HD  Subjective:  Last HD on 1/1 with no UF (we took out of UF as she had reported chest discomfort with initial goal).  Note imdur  was added back last night. She states that she was told that she might go home tomorrow per her team.    Review of systems:  Feels ok  Denies shortness of breath  Chest pain is gone; resolved got better during HD after setting changes Denies n/v  ----------- Background on consult:  ROBYNNE ROAT is an 49 y.o. female with past medical history significant for hypertension, type 2 diabetes, CVA, A-fib, anxiety depression, dyslipidemia, ESRD on HD TTS who was brought in from dialysis center after a witnessed seizure, seen as a consultation for the management of ESRD. Apparently the patient was in the dialysis center, and treatment about 2 hours when she started having generalized seizure associated with a frothy mouth and bowel incontinence.  The EMS was called and patient was brought to the ER. In the ER, the initial blood pressure was 193/92, in room air, afebrile.  The labs showed potassium level 4.4, BUN 63, ionized calcium  0.74, hemoglobin 9.7 which was dropped from earlier 10.9. CT head with no acute finding.  Chest x-ray with mild pulmonary edema and mild cardiomegaly.  The patient was seen by neurologist and plan for MRI tonight. The patient is currently awake but does not recall the event from earlier.  She denies past history of seizure disorder.  Receiving calcium  IV in the ER and now admitted for further evaluation.   Intake/Output Summary (Last 24 hours) at 09/10/2024 1149 Last data filed at 09/09/2024 1825 Gross per 24 hour  Intake 711 ml  Output 0 ml  Net 711 ml    Vitals:  Vitals:   09/10/24 0031 09/10/24 0425 09/10/24 0849 09/10/24 1147  BP: (!) 133/57 130/61 (!) 144/76 (!) 140/64  Pulse: 92 82 82 79  Resp: 18 17 19  19   Temp: 97.6 F (36.4 C) 98.1 F (36.7 C) 98.2 F (36.8 C) 98.2 F (36.8 C)  TempSrc: Oral Oral Oral Oral  SpO2: 100% 100% 100% 99%  Weight:      Height:         Physical Exam:    General adult female in bed in no acute distress HEENT normocephalic atraumatic extraocular movements intact sclera anicteric Neck supple trachea midline Lungs clear to auscultation bilaterally normal work of breathing at rest Heart tachycardic no rub appreciated Abdomen soft nontender nondistended Extremities no edema  Psych normal mood and affect Neuro - alert and oriented x 3 provides hx and follows commands  Access LUE AVF with bruit and thrill    Medications reviewed    Labs:     Latest Ref Rng & Units 09/10/2024    3:17 AM 09/09/2024    2:02 PM 09/08/2024   11:16 PM  BMP  Glucose 70 - 99 mg/dL 862  795    BUN 6 - 20 mg/dL 49  73    Creatinine 9.55 - 1.00 mg/dL 0.58  87.29    Sodium 864 - 145 mmol/L 138  138  138   Potassium 3.5 - 5.1 mmol/L 4.0  4.9  3.8   Chloride 98 - 111 mmol/L 97  95    CO2 22 - 32 mmol/L 27  20    Calcium  8.9 - 10.3 mg/dL 7.0  7.5  Outpatient HD orders:  [facility], TTS, 4 hr, 2k 2.5 ca, EDW 60 kg Last OP HD today 12/31 short HD 2 hr due to witnessed seizure. LUE AVF Mircera 100 mcg 12/13 Hectoral 3 mcg three times per week PTH 318    Assessment/Plan:   # Generalized seizure, new onset:  - CT scan with no acute finding, had MRI with no acute intracranial abnormality.   - Seen by neurology   # ESRD TTS:  - Continue with HD per TTS schedule - Last outpatient HD stopped early due to seizure.  Tolerated treatment on 1/1 but we did take her out of UF   # Hypertension:  - noncompliant with prescribed home meds - would be cautious re: resuming.  Would ultimately discharge her on only the anti-hypertensives that she is given (and tolerates) this admission  - continue coreg     # Anemia of ESRD:  - resume ESA.  Aranesp  at 100 mcg every Friday     #  Metabolic Bone Disease, secondary hyperparathyroidism, hyperphosphatemia, hypocalcemia:  - s/p calcium . continue Hectorol.  Phos controlled on current regimen/diet    Disposition - per primary team   Katheryn JAYSON Saba, MD 09/10/2024 12:27 PM

## 2024-09-11 ENCOUNTER — Other Ambulatory Visit (HOSPITAL_COMMUNITY): Payer: Self-pay

## 2024-09-11 DIAGNOSIS — R569 Unspecified convulsions: Secondary | ICD-10-CM | POA: Diagnosis not present

## 2024-09-11 LAB — RENAL FUNCTION PANEL
Albumin: 3.9 g/dL (ref 3.5–5.0)
Anion gap: 11 (ref 5–15)
BUN: 19 mg/dL (ref 6–20)
CO2: 29 mmol/L (ref 22–32)
Calcium: 8.1 mg/dL — ABNORMAL LOW (ref 8.9–10.3)
Chloride: 97 mmol/L — ABNORMAL LOW (ref 98–111)
Creatinine, Ser: 4.74 mg/dL — ABNORMAL HIGH (ref 0.44–1.00)
GFR, Estimated: 11 mL/min — ABNORMAL LOW
Glucose, Bld: 80 mg/dL (ref 70–99)
Phosphorus: 2 mg/dL — ABNORMAL LOW (ref 2.5–4.6)
Potassium: 4.2 mmol/L (ref 3.5–5.1)
Sodium: 137 mmol/L (ref 135–145)

## 2024-09-11 LAB — GLUCOSE, CAPILLARY
Glucose-Capillary: 148 mg/dL — ABNORMAL HIGH (ref 70–99)
Glucose-Capillary: 170 mg/dL — ABNORMAL HIGH (ref 70–99)

## 2024-09-11 LAB — HEMOGLOBIN AND HEMATOCRIT, BLOOD
HCT: 23.8 % — ABNORMAL LOW (ref 36.0–46.0)
Hemoglobin: 7.9 g/dL — ABNORMAL LOW (ref 12.0–15.0)

## 2024-09-11 MED ORDER — DOXERCALCIFEROL 4 MCG/2ML IV SOLN
3.0000 ug | INTRAVENOUS | Status: DC
  Administered 2024-09-11: 3 ug via INTRAVENOUS

## 2024-09-11 MED ORDER — LIDOCAINE HCL (PF) 1 % IJ SOLN: 5.0000 mL | INTRAMUSCULAR | Status: DC | PRN

## 2024-09-11 MED ORDER — PROCHLORPERAZINE MALEATE 5 MG PO TABS
5.0000 mg | ORAL_TABLET | Freq: Once | ORAL | Status: DC
  Filled 2024-09-11 (×2): qty 1

## 2024-09-11 MED ORDER — CARVEDILOL 6.25 MG PO TABS
6.2500 mg | ORAL_TABLET | Freq: Two times a day (BID) | ORAL | 0 refills | Status: AC
  Filled 2024-09-11: qty 60, 30d supply, fill #0

## 2024-09-11 MED ORDER — PENTAFLUOROPROP-TETRAFLUOROETH EX AERO: 1.0000 | INHALATION_SPRAY | CUTANEOUS | Status: DC | PRN

## 2024-09-11 MED ORDER — ALTEPLASE 2 MG IJ SOLR: 2.0000 mg | Freq: Once | INTRAMUSCULAR | Status: DC | PRN

## 2024-09-11 MED ORDER — LIDOCAINE-PRILOCAINE 2.5-2.5 % EX CREA: 1.0000 | TOPICAL_CREAM | CUTANEOUS | Status: DC | PRN

## 2024-09-11 MED ORDER — DOXERCALCIFEROL 4 MCG/2ML IV SOLN
INTRAVENOUS | Status: AC
  Filled 2024-09-11: qty 2

## 2024-09-11 MED ORDER — HYDRALAZINE HCL 25 MG PO TABS
25.0000 mg | ORAL_TABLET | Freq: Three times a day (TID) | ORAL | 1 refills | Status: AC
  Filled 2024-09-11: qty 180, 60d supply, fill #0

## 2024-09-11 MED ORDER — AMLODIPINE BESYLATE 10 MG PO TABS
10.0000 mg | ORAL_TABLET | Freq: Every day | ORAL | 0 refills | Status: AC
  Filled 2024-09-11: qty 90, 90d supply, fill #0

## 2024-09-11 MED ORDER — HEPARIN SODIUM (PORCINE) 1000 UNIT/ML DIALYSIS: 1000.0000 [IU] | INTRAMUSCULAR | Status: DC | PRN

## 2024-09-11 MED ORDER — ANTICOAGULANT SODIUM CITRATE 4% (200MG/5ML) IV SOLN: 5.0000 mL | Status: DC | PRN

## 2024-09-11 NOTE — Discharge Summary (Signed)
 Physician Discharge Summary  Stacey Lucas FMW:[id number] DOB: May 24, 1976 DOA: 09/08/2024  PCP: Delbert Clam, MD  Admit date: 09/08/2024  Discharge date: 09/11/2024  Admitted From: Home  Disposition:  Home Health Services  Recommendations for Outpatient Follow-up:  Follow up with PCP in 1-2 weeks. Please obtain BMP/CBC in one week. Advised to continue hemodialysis as per schedule. Medication compliance explained and emphasized in detail.  Home Health: Home PT/OT Equipment/Devices:None  Discharge Condition: Stable. CODE STATUS:DNR Diet recommendation: Renal Diet  Brief Summary / Hospital Course: This 49 y.o. female with medical history significant of ESRD on dialysis MWF, history of CVA with mild right-sided hemiparesis,, HTN, diabetes type 2, paroxysmal atrial fibrillation on Eliquis , anemia, depression, polysubstance abuse presented in the ED with new onset seizures. Patient was at hemodialysis today, and after receiving hemodialysis for an hour had a witnessed seizure event that lasted 10 minutes as per staff and is resolved with no postictal. Patient has no prior history of seizures. CT head was nonacute but she was found to have low calcium . Neurology has evaluated the patient recommended MRI brain and correcting serum calcium . MRI unremarkable. Nephrology consulted for hypocalcemia leading to seizures and for continuation of hemodialysis. Patient was recently discharged from nursing home 2 weeks ago. She lives with her mom who is  not able to take care of herself. Patient has not been taking her medications. Patient was admitted for further evaluation.  Neurologist has not recommended an antiseizure medications.  Calcium  has improved.  Patient was continued on Eliquis  and continued on hemodialysis.  She is now back on the schedule.  Echocardiogram shows moderate pericardial effusion.  Cardiology consulted, states slight pericardial effusion,  no intervention needed.  There is no  signs of tamponade. Patient feels better and  wants to be discharged Home.  Discharge Diagnoses:  Principal Problem:   New onset seizure (HCC) Active Problems:   DM2 (diabetes mellitus, type 2) (HCC)   Hyperlipidemia   Paroxysmal atrial fibrillation (HCC)   H/O: stroke   Anemia   ESRD (end stage renal disease) (HCC)   Major depressive disorder, recurrent episode, moderate (HCC)   Hypocalcemia   Tobacco abuse   Cardiac murmur  New onset seizure (HCC); Patient denies any prior history of seizures. Likely due to hypocalcemia. Neurology consulted recommended MRI and correcting calcium . MRI brain showed no acute intracranial abnormality found. Neurologist has not recommended antiseizure medications. Calcium  improved.   Paroxysmal atrial fibrillation (HCC) Monitor on telemetry. HR controlled. Continue Eliquis , Continue Coreg  6.25 mg p.o. twice daily.   DM2 (diabetes mellitus, type 2) (HCC) Diabetic diet.  Continue sliding scale. Continue Lantus  4 units subcu daily.   ESRD (end stage renal disease) [facility]) Appreciate nephrology consult. Continue hemodialysis as per nephrology. She is back on schedule now.   H/O: stroke: Continue Lipitor  when able to tolerate. Continue Eliquis .   Hyperlipidemia: Continue Lipitor  40 mg a day.   Major depressive disorder, recurrent episode, moderate (HCC): Continue Zoloft  75 mg daily. Continue trazodone  50 mg at bedtime.   Hypocalcemia: Serum calcium  replaced and improved.   Anemia: Hb dropped from 9.2 >7.1 > 7.9 .  There is no any obvious visible bleeding. Transfuse for Hg <7 , rapidly dropping or  if symptomatic.   Tobacco abuse Continue nicotine  patch Smoking cessation counseling completed.     Moderate pericardial Effusion: Echo shows moderate pericardial effusion.  No evidence of tamponade.   Cardiology is consulted.  No further intervention needed, its just trace pericardial effusion.  Discharge  Instructions  Discharge  Instructions     Call MD for:  difficulty breathing, headache or visual disturbances   Complete by: As directed    Call MD for:  persistant dizziness or light-headedness   Complete by: As directed    Call MD for:  persistant nausea and vomiting   Complete by: As directed    Diet renal with fluid restriction   Complete by: As directed    Discharge instructions   Complete by: As directed    Advised to follow up PCP in one week. Advised to continue current medications. Medication compliance emphasized and explained in detail. Continue hemodialysis as per schedule.   Increase activity slowly   Complete by: As directed    No wound care   Complete by: As directed       Allergies as of 09/11/2024       Reactions   Neurontin  [gabapentin ] Other (See Comments)   Weakness  Balance impairment         Medication List     STOP taking these medications    atorvastatin  40 MG tablet Commonly known as: LIPITOR    buPROPion  150 MG 24 hr tablet Commonly known as: Wellbutrin  XL   insulin  aspart 100 UNIT/ML FlexPen Commonly known as: NOVOLOG    pantoprazole  40 MG tablet Commonly known as: PROTONIX    sertraline  25 MG tablet Commonly known as: ZOLOFT    traZODone  50 MG tablet Commonly known as: DESYREL        TAKE these medications    amLODipine  10 MG tablet Commonly known as: NORVASC  Take 1 tablet (10 mg total) by mouth daily.   apixaban  2.5 MG Tabs tablet Commonly known as: ELIQUIS  Take 1 tablet (2.5 mg total) by mouth 2 (two) times daily.   carvedilol  6.25 MG tablet Commonly known as: COREG  Take 1 tablet (6.25 mg total) by mouth 2 (two) times daily with a meal.   hydrALAZINE  25 MG tablet Commonly known as: APRESOLINE  Take 1 tablet (25 mg total) by mouth 3 (three) times daily. What changed: when to take this   insulin  glargine 100 UNIT/ML injection Commonly known as: LANTUS  Inject 0.08 mLs (8 Units total) into the skin daily.   isosorbide  mononitrate 30 MG 24 hr  tablet Commonly known as: IMDUR  Take 1 tablet (30 mg total) by mouth daily.        Contact information for follow-up providers     [facility] medicaid transportation Follow up.   Why: Call 2-3 days in advance to schedule transportation for appointments Contact information: [phone]  or call [phone]        Delbert Clam, MD Follow up in 1 week(s).   Specialty: Family Medicine Contact information: [address] [place] [address] [place] [address] [phone]              Contact information for after-discharge care     Home Medical Care     [facility]) .   Service: Home Health Services Contact information: [address] [place]  [address] [phone]                    Allergies[1]  Consultations: Nephrology   Procedures/Studies: ECHOCARDIOGRAM COMPLETE Result Date: 09/09/2024    ECHOCARDIOGRAM REPORT   Patient Name:   Stacey Lucas Date of Exam: 09/09/2024 Medical Rec #:  [id number]      Height:       65.0 in Accession #:    [id number]  Weight:       136.5 lb Date of Birth:  10-24-1975     BSA:          1.682 m Patient Age:    48 years       BP:           163/87 mmHg Patient Gender: F              HR:           127 bpm. Exam Location:  Inpatient Procedure: 2D Echo, Cardiac Doppler and Color Doppler (Both Spectral and Color            Flow Doppler were utilized during procedure). Indications:    Murmur  History:        Patient has prior history of Echocardiogram examinations, most                 recent 07/07/2023. Stroke, Arrythmias:Atrial Fibrillation,                 Signs/Symptoms:Murmur, Shortness of Breath and Edema; Risk                 Factors:Diabetes, Dyslipidemia, Current Smoker and Hypertension.  Sonographer:    Juliene Rucks Referring Phys: [id number] ANASTASSIA DOUTOVA IMPRESSIONS  1. Left ventricular ejection fraction, by estimation, is 55%. The left ventricle has normal function. The  left ventricle has no regional wall motion abnormalities. There is severe asymmetric left ventricular hypertrophy of the basal-septal and infero-lateral segments. Indeterminate diastolic filling due to E-A fusion.  2. Right ventricular systolic function is normal. The right ventricular size is normal. Tricuspid regurgitation signal is inadequate for assessing PA pressure.  3. The interatrial septum buldges into the right atrium consistent with increased left atrial pressure. Left atrial size was severely dilated.  4. Moderate pericardial effusion. The pericardial effusion is circumferential. There is no evidence of cardiac tamponade.  5. The mitral valve is normal in structure. No evidence of mitral valve regurgitation. No evidence of mitral stenosis.  6. The aortic valve is tricuspid. Aortic valve regurgitation is not visualized. No aortic stenosis is present.  7. The inferior vena cava is normal in size with <50% respiratory variability, suggesting right atrial pressure of 8 mmHg. Comparison(s): Changes from prior study are noted. Small-moderate pericardial effusion, severe LVH now present. Conclusion(s)/Recommendation(s): No cause for murmur identified. FINDINGS  Left Ventricle: Left ventricular ejection fraction, by estimation, is 55%. The left ventricle has normal function. The left ventricle has no regional wall motion abnormalities. The left ventricular internal cavity size was normal in size. There is severe asymmetric left ventricular hypertrophy of the basal-septal and infero-lateral segments. Indeterminate diastolic filling due to E-A fusion. Right Ventricle: The right ventricular size is normal. No increase in right ventricular wall thickness. Right ventricular systolic function is normal. Tricuspid regurgitation signal is inadequate for assessing PA pressure. Left Atrium: The interatrial septum buldges into the right atrium consistent with increased left atrial pressure. Left atrial size was severely  dilated. Right Atrium: Right atrial size was normal in size. Pericardium: A moderately sized pericardial effusion is present. The pericardial effusion is circumferential. There is no evidence of cardiac tamponade. Mitral Valve: The mitral valve is normal in structure. No evidence of mitral valve regurgitation. No evidence of mitral valve stenosis. Tricuspid Valve: The tricuspid valve is normal in structure. Tricuspid valve regurgitation is trivial. No evidence of tricuspid stenosis. Aortic Valve: The aortic valve is tricuspid. Aortic valve regurgitation is not visualized. No aortic  stenosis is present. Pulmonic Valve: The pulmonic valve was normal in structure. Pulmonic valve regurgitation is not visualized. No evidence of pulmonic stenosis. Aorta: The aortic root and ascending aorta are structurally normal, with no evidence of dilitation. Venous: The inferior vena cava is normal in size with less than 50% respiratory variability, suggesting right atrial pressure of 8 mmHg. IAS/Shunts: No atrial level shunt detected by color flow Doppler.  LEFT VENTRICLE PLAX 2D LVIDd:         3.90 cm   Diastology LVIDs:         3.00 cm   LV e' medial: 5.70 cm/s LV PW:         1.80 cm LV IVS:        1.40 cm LVOT diam:     1.90 cm LV SV:         34 LV SV Index:   20 LVOT Area:     2.84 cm  RIGHT VENTRICLE RV Basal diam:  3.70 cm RV Mid diam:    3.00 cm LEFT ATRIUM           Index        RIGHT ATRIUM           Index LA diam:      4.70 cm 2.80 cm/m   RA Area:     15.50 cm LA Vol (A2C): 36.0 ml 21.41 ml/m  RA Volume:   36.90 ml  21.94 ml/m LA Vol (A4C): 99.9 ml 59.41 ml/m  AORTIC VALVE LVOT Vmax:   100.00 cm/s LVOT Vmean:  68.100 cm/s LVOT VTI:    0.119 m  AORTA Ao Root diam: 2.70 cm  SHUNTS Systemic VTI:  0.12 m Systemic Diam: 1.90 cm Georganna Archer Electronically signed by Georganna Archer Signature Date/Time: 09/09/2024/11:40:34 AM    Final    MR BRAIN WO CONTRAST Result Date: 09/08/2024 EXAM: MRI BRAIN WITHOUT CONTRAST  09/08/2024 07:51:22 PM TECHNIQUE: Multiplanar multisequence MRI of the head/brain was performed without the administration of intravenous contrast. COMPARISON: 11/14/2022 CLINICAL HISTORY: Seizure, new-onset, no history of trauma. FINDINGS: BRAIN AND VENTRICLES: There is an old infarct of the left pons, but no acute infarct. There are a few scattered chronic microhemorrhages. Early confluent hyperintense T2-weighted signal within the cerebral white matter, most commonly due to chronic small vessel disease. No mass. No midline shift. No hydrocephalus. The sella is unremarkable. Normal flow voids. ORBITS: No acute abnormality. SINUSES AND MASTOIDS: No acute abnormality. BONES AND SOFT TISSUES: Normal marrow signal. No acute soft tissue abnormality. IMPRESSION: 1. No acute intracranial abnormality. Electronically signed by: Franky Stanford MD 09/08/2024 08:08 PM EST RP Workstation: [id number]   DG Chest Port 1 View Result Date: 09/08/2024 EXAM: 1 VIEW(S) XRAY OF THE CHEST 09/08/2024 03:49:00 PM COMPARISON: 08/06/2024 CLINICAL HISTORY: ams FINDINGS: LUNGS AND PLEURA: Prominence of the central pulmonary vasculature with bilateral interstitial opacities concerning for mild pulmonary edema. No pleural effusion. No pneumothorax. HEART AND MEDIASTINUM: Similar appearance of mild cardiomegaly. No acute abnormality of the mediastinal silhouette. BONES AND SOFT TISSUES: No acute osseous abnormality. IMPRESSION: 1. Prominence of the central pulmonary vasculature with bilateral interstitial opacities, concerning for mild pulmonary edema. 2. Mild cardiomegaly. Electronically signed by: Donnice Mania MD 09/08/2024 04:39 PM EST RP Workstation: [id number]   CT Head Wo Contrast Result Date: 09/08/2024 EXAM: CT HEAD WITHOUT CONTRAST 09/08/2024 03:24:31 PM TECHNIQUE: CT of the head was performed without the administration of intravenous contrast. Automated exposure control, iterative reconstruction, and/or weight based adjustment of  the  mA/kV was utilized to reduce the radiation dose to as low as reasonably achievable. COMPARISON: None available. CLINICAL HISTORY: Mental status change, unknown cause FINDINGS: BRAIN AND VENTRICLES: No acute hemorrhage. No evidence of acute infarct. Moderate chronic small vessel ischemic disease. Remote bilateral basal ganglia infarcts, left greater than right. Intracranial arterial calcification. No hydrocephalus. No extra-axial collection. No mass effect or midline shift. ORBITS: No acute abnormality. SINUSES: Remote left lamina papyracea fracture. SOFT TISSUES AND SKULL: No acute soft tissue abnormality. No skull fracture. IMPRESSION: 1. No acute intracranial abnormality. Electronically signed by: Morgane Naveau MD 09/08/2024 03:39 PM EST RP Workstation: [id number]   Subjective: Patient was seen and examined at bedside.  Overnight events noted. Patient feels better and want to be discharged home.  Discharge Exam: Vitals:   09/11/24 1216 09/11/24 1225  BP: (!) 173/75 (!) 172/70  Pulse: 78 79  Resp: 17 17  Temp:    SpO2: 99% 99%   Vitals:   09/11/24 1133 09/11/24 1200 09/11/24 1216 09/11/24 1225  BP: (!) 179/75 (!) 184/71 (!) 173/75 (!) 172/70  Pulse: 78 79 78 79  Resp: 16 15 17 17   Temp:      TempSrc:      SpO2: 98% 100% 99% 99%  Weight:    65.7 kg  Height:        General: Pt is alert, awake, not in acute distress Cardiovascular: RRR, S1/S2 +, no rubs, no gallops Respiratory: CTA bilaterally, no wheezing, no rhonchi Abdominal: Soft, NT, ND, bowel sounds + Extremities: no edema, no cyanosis    The results of significant diagnostics from this hospitalization (including imaging, microbiology, ancillary and laboratory) are listed below for reference.     Microbiology: Recent Results (from the past 240 hours)  MRSA Next Gen by PCR, Nasal     Status: None   Collection Time: 09/10/24 11:30 AM   Specimen: Nasal Mucosa; Nasal Swab  Result Value Ref Range Status   MRSA by PCR  Next Gen NOT DETECTED NOT DETECTED Final    Comment: (NOTE) The GeneXpert MRSA Assay (FDA approved for NASAL specimens only), is one component of a comprehensive MRSA colonization surveillance program. It is not intended to diagnose MRSA infection nor to guide or monitor treatment for MRSA infections. Test performance is not FDA approved in patients less than 46 years old. Performed at [facility], [address], [place], [place] [address]      Labs: BNP (last 3 results) Recent Labs    09/17/23 1910 11/01/23 1915 03/24/24 0353  BNP 1,284.2* 2,761.9* 1,350.5*   Basic Metabolic Panel: Recent Labs  Lab 09/08/24 1504 09/08/24 2301 09/08/24 2316 09/09/24 1402 09/10/24 0317 09/11/24 1451  NA 138 139 138 138 138 137  K 4.4 4.0 3.8 4.9 4.0 4.2  CL 94* 95*  --  95* 97* 97*  CO2 25 26  --  20* 27 29  GLUCOSE 160* 113*  --  204* 137* 80  BUN 63* 66*  --  73* 49* 19  CREATININE 11.00* 11.70*  --  12.70* 9.41* 4.74*  CALCIUM  7.4* 7.6*  --  7.5* 7.0* 8.1*  MG  --  2.3  --   --  2.2  --   PHOS  --  5.7*  --  6.8* 4.2 2.0*   Liver Function Tests: Recent Labs  Lab 09/08/24 1504 09/09/24 1402 09/10/24 0317 09/11/24 1451  AST 40  --  21  --   ALT 15  --  9  --  ALKPHOS 92  --  83  --   BILITOT 0.6  --  0.3  --   PROT 7.7  --  5.9*  --   ALBUMIN  4.2 3.7 3.4* 3.9   No results for input(s): LIPASE, AMYLASE in the last 168 hours. No results for input(s): AMMONIA in the last 168 hours. CBC: Recent Labs  Lab 09/08/24 1504 09/08/24 2316 09/09/24 1402 09/10/24 0317 09/11/24 1451  WBC 11.7*  --  10.1 8.5  --   HGB 9.7* 9.9* 9.2* 7.1* 7.9*  HCT 28.6* 29.0* 27.4* 21.3* 23.8*  MCV 78.4*  --  79.4* 79.8*  --   PLT 178  --  186 157  --    Cardiac Enzymes: Recent Labs  Lab 09/08/24 2301  CKTOTAL 156   BNP: Invalid input(s): POCBNP CBG: Recent Labs  Lab 09/10/24 0637 09/10/24 1105 09/10/24 1649 09/10/24 2131 09/11/24 0627  GLUCAP 101* 168* 222*  190* 148*   D-Dimer No results for input(s): DDIMER in the last 72 hours. Hgb A1c Recent Labs    09/08/24 2301  HGBA1C 6.5*   Lipid Profile No results for input(s): CHOL, HDL, LDLCALC, TRIG, CHOLHDL, LDLDIRECT in the last 72 hours. Thyroid function studies No results for input(s): TSH, T4TOTAL, T3FREE, THYROIDAB in the last 72 hours.  Invalid input(s): FREET3 Anemia work up Recent Labs    09/08/24 2301 09/08/24 2304 09/09/24 0500  VITAMINB12  --   --  607  FOLATE  --   --  8.9  FERRITIN 1,324*  --   --   TIBC 214*  --   --   IRON  134  --   --   RETICCTPCT  --  0.9  --    Urinalysis    Component Value Date/Time   COLORURINE AMBER (A) 11/14/2022 1902   APPEARANCEUR CLOUDY (A) 11/14/2022 1902   LABSPEC 1.018 11/14/2022 1902   PHURINE 7.0 11/14/2022 1902   GLUCOSEU NEGATIVE 11/14/2022 1902   HGBUR NEGATIVE 11/14/2022 1902   BILIRUBINUR NEGATIVE 11/14/2022 1902   KETONESUR 5 (A) 11/14/2022 1902   PROTEINUR >=300 (A) 11/14/2022 1902   NITRITE NEGATIVE 11/14/2022 1902   LEUKOCYTESUR MODERATE (A) 11/14/2022 1902   Sepsis Labs Recent Labs  Lab 09/08/24 1504 09/09/24 1402 09/10/24 0317  WBC 11.7* 10.1 8.5   Microbiology Recent Results (from the past 240 hours)  MRSA Next Gen by PCR, Nasal     Status: None   Collection Time: 09/10/24 11:30 AM   Specimen: Nasal Mucosa; Nasal Swab  Result Value Ref Range Status   MRSA by PCR Next Gen NOT DETECTED NOT DETECTED Final    Comment: (NOTE) The GeneXpert MRSA Assay (FDA approved for NASAL specimens only), is one component of a comprehensive MRSA colonization surveillance program. It is not intended to diagnose MRSA infection nor to guide or monitor treatment for MRSA infections. Test performance is not FDA approved in patients less than 80 years old. Performed at [facility], [address], [place], [place] [address]      Time coordinating discharge: Over 30  minutes  SIGNED:   Darcel Dawley, MD  [facility] 09/11/2024, 4:07 PM Pager   If 7PM-7AM, please contact night-coverage     [1]  Allergies Allergen Reactions   Neurontin  [Gabapentin ] Other (See Comments)    Weakness  Balance impairment

## 2024-09-11 NOTE — Plan of Care (Signed)
" °  Problem: Education: Goal: Ability to describe self-care measures that may prevent or decrease complications (Diabetes Survival Skills Education) will improve Outcome: Progressing Goal: Individualized Educational Video(s) Outcome: Progressing   Problem: Coping: Goal: Ability to adjust to condition or change in health will improve Outcome: Progressing   Problem: Coping: Goal: Ability to adjust to condition or change in health will improve Outcome: Progressing   Problem: Fluid Volume: Goal: Ability to maintain a balanced intake and output will improve Outcome: Progressing   Problem: Health Behavior/Discharge Planning: Goal: Ability to identify and utilize available resources and services will improve Outcome: Progressing Goal: Ability to manage health-related needs will improve Outcome: Progressing   Problem: Nutritional: Goal: Maintenance of adequate nutrition will improve Outcome: Progressing Goal: Progress toward achieving an optimal weight will improve Outcome: Progressing   Problem: Nutritional: Goal: Progress toward achieving an optimal weight will improve Outcome: Progressing   Problem: Skin Integrity: Goal: Risk for impaired skin integrity will decrease Outcome: Progressing   Problem: Tissue Perfusion: Goal: Adequacy of tissue perfusion will improve Outcome: Progressing   Problem: Education: Goal: Knowledge of General Education information will improve Description: Including pain rating scale, medication(s)/side effects and non-pharmacologic comfort measures Outcome: Progressing   Problem: Health Behavior/Discharge Planning: Goal: Ability to manage health-related needs will improve Outcome: Progressing   Problem: Clinical Measurements: Goal: Ability to maintain clinical measurements within normal limits will improve Outcome: Progressing Goal: Will remain free from infection Outcome: Progressing Goal: Diagnostic test results will improve Outcome:  Progressing Goal: Respiratory complications will improve Outcome: Progressing Goal: Cardiovascular complication will be avoided Outcome: Progressing   Problem: Coping: Goal: Level of anxiety will decrease Outcome: Progressing   Problem: Elimination: Goal: Will not experience complications related to bowel motility Outcome: Progressing Goal: Will not experience complications related to urinary retention Outcome: Progressing   Problem: Pain Managment: Goal: General experience of comfort will improve and/or be controlled Outcome: Progressing   Problem: Safety: Goal: Ability to remain free from injury will improve Outcome: Progressing   Problem: Skin Integrity: Goal: Risk for impaired skin integrity will decrease Outcome: Progressing   "

## 2024-09-11 NOTE — Discharge Planning (Signed)
 [facility] Patient Discharge Orders - [facility] CLINIC: [facility]  Patient's name: Stacey Lucas Admit/DC Dates: 09/08/2024 - 09/11/2024  DISCHARGE DIAGNOSES: Generalized seizure, new onset - imaging negative, neuro felt possible due to hypocalcemia and meds not needed at this time   Hypocalcemia pericardial effusion, no tamponade  HD ORDER CHANGES: Heparin  change: no - stay off given pericardial effusion EDW Change: yes -  61kg Bath Change: yes - 2K/3Ca bath if have it  ANEMIA MANAGEMENT: Aranesp : Given: yes   Amount/Date of last dose: on 1/2 ESA dose for discharge: mircera 150 mcg IV q 2 weeks, to start on 09/17/24 IV Iron  dose at discharge: per protocol Transfusion: Given: no  BONE/MINERAL MEDICATIONS: Hectorol /Calcitriol change: no - continue Hectoral 3mcg IV q HD Sensipar/Parsabiv change: no  ACCESS INTERVENTION/CHANGE: no Details:   RECENT LABS: Recent Labs  Lab 09/11/24 1451  HGB 7.9*  NA 137  K 4.2  CALCIUM  8.1*  PHOS 2.0*  ALBUMIN  3.9    IV ANTIBIOTICS: no Details:  OTHER ANTICOAGULATION: yes Details: on Eliquis   OTHER/APPTS/LAB ORDERS: - Pls check Ca weekly x 4 times   D/C Meds to be reconciled by nurse after every discharge.  Completed By: Izetta Boehringer, PA-C [facility] Pager [phone]   Reviewed by: MD:______ RN_______

## 2024-09-11 NOTE — Progress Notes (Signed)
" °   09/11/24 1225  Vitals  Pulse Rate 79  Resp 17  BP (!) 172/70  SpO2 99 %  O2 Device Room Air  Weight 65.7 kg  Type of Weight Post-Dialysis  Post Treatment  Dialyzer Clearance Clear  Liters Processed 83.9  Fluid Removed (mL) 1500 mL  Tolerated HD Treatment Yes  AVG/AVF Arterial Site Held (minutes) 15 minutes  AVG/AVF Venous Site Held (minutes) 7 minutes   Received patient in bed to unit.  Alert and oriented.  Informed consent signed and in chart.   TX duration: 3.5 hours  Patient tolerated well.  Transported back to the room  Alert, without acute distress.  Hand-off given to patient's nurse.   Access used: AVF Access issues: Post treatment bleeding  Total UF removed: 1500 ml Medication(s) given: Hectorol  3 mcg. Compazine  not given to nausea and vomiting resolved by the time pharmacist verified order Post HD weight: 65.7 kg Post HD VS: see data above   Stacey Lucas [facility] "

## 2024-09-11 NOTE — Progress Notes (Signed)
 " [facility] Progress Note   Subjective:   Seen on HD - 1.5L UFG and tolerating. Denies CP, dyspnea, abd pain.  Objective Vitals:   09/10/24 1935 09/11/24 0500 09/11/24 0813 09/11/24 0838  BP: (!) 151/71  (!) 160/83 (!) 167/77  Pulse: 89  80 81  Resp:   20 15  Temp: 98.5 F (36.9 C)  98.2 F (36.8 C)   TempSrc: Oral     SpO2: 96%  96% 97%  Weight:  66.6 kg 66.1 kg   Height:       Physical Exam General: Well appearing, NAD. Room air Heart: RRR, no murmur Lungs: CTA anteriolaterally Abdomen: soft, non-tender Extremities: no LE edema Dialysis Access: LUE AVF +t/b  Additional Objective Labs: Basic Metabolic Panel: Recent Labs  Lab 09/08/24 2301 09/08/24 2316 09/09/24 1402 09/10/24 0317  NA 139 138 138 138  K 4.0 3.8 4.9 4.0  CL 95*  --  95* 97*  CO2 26  --  20* 27  GLUCOSE 113*  --  204* 137*  BUN 66*  --  73* 49*  CREATININE 11.70*  --  12.70* 9.41*  CALCIUM  7.6*  --  7.5* 7.0*  PHOS 5.7*  --  6.8* 4.2   Liver Function Tests: Recent Labs  Lab 09/08/24 1504 09/09/24 1402 09/10/24 0317  AST 40  --  21  ALT 15  --  9  ALKPHOS 92  --  83  BILITOT 0.6  --  0.3  PROT 7.7  --  5.9*  ALBUMIN  4.2 3.7 3.4*   CBC: Recent Labs  Lab 09/08/24 1504 09/08/24 2316 09/09/24 1402 09/10/24 0317  WBC 11.7*  --  10.1 8.5  HGB 9.7* 9.9* 9.2* 7.1*  HCT 28.6* 29.0* 27.4* 21.3*  MCV 78.4*  --  79.4* 79.8*  PLT 178  --  186 157   CBG: Recent Labs  Lab 09/10/24 0637 09/10/24 1105 09/10/24 1649 09/10/24 2131 09/11/24 0627  GLUCAP 101* 168* 222* 190* 148*   Iron  Studies:  Recent Labs    09/08/24 2301  IRON  134  TIBC 214*  FERRITIN 1,324*   Studies/Results: ECHOCARDIOGRAM COMPLETE Result Date: 09/09/2024    ECHOCARDIOGRAM REPORT   Patient Name:   Stacey Lucas Date of Exam: 09/09/2024 Medical Rec #:  [id number]      Height:       65.0 in Accession #:    [id number]     Weight:       136.5 lb Date of Birth:  [birth year]     BSA:          1.682 m  Patient Age:    48 years       BP:           163/87 mmHg Patient Gender: F              HR:           127 bpm. Exam Location:  Inpatient Procedure: 2D Echo, Cardiac Doppler and Color Doppler (Both Spectral and Color            Flow Doppler were utilized during procedure). Indications:    Murmur  History:        Patient has prior history of Echocardiogram examinations, most                 recent 07/07/2023. Stroke, Arrythmias:Atrial Fibrillation,                 Signs/Symptoms:Murmur, Shortness of Breath  and Edema; Risk                 Factors:Diabetes, Dyslipidemia, Current Smoker and Hypertension.  Sonographer:    Juliene Rucks Referring Phys: [id number] ANASTASSIA DOUTOVA IMPRESSIONS  1. Left ventricular ejection fraction, by estimation, is 55%. The left ventricle has normal function. The left ventricle has no regional wall motion abnormalities. There is severe asymmetric left ventricular hypertrophy of the basal-septal and infero-lateral segments. Indeterminate diastolic filling due to E-A fusion.  2. Right ventricular systolic function is normal. The right ventricular size is normal. Tricuspid regurgitation signal is inadequate for assessing PA pressure.  3. The interatrial septum buldges into the right atrium consistent with increased left atrial pressure. Left atrial size was severely dilated.  4. Moderate pericardial effusion. The pericardial effusion is circumferential. There is no evidence of cardiac tamponade.  5. The mitral valve is normal in structure. No evidence of mitral valve regurgitation. No evidence of mitral stenosis.  6. The aortic valve is tricuspid. Aortic valve regurgitation is not visualized. No aortic stenosis is present.  7. The inferior vena cava is normal in size with <50% respiratory variability, suggesting right atrial pressure of 8 mmHg. Comparison(s): Changes from prior study are noted. Small-moderate pericardial effusion, severe LVH now present. Conclusion(s)/Recommendation(s): No cause  for murmur identified. FINDINGS  Left Ventricle: Left ventricular ejection fraction, by estimation, is 55%. The left ventricle has normal function. The left ventricle has no regional wall motion abnormalities. The left ventricular internal cavity size was normal in size. There is severe asymmetric left ventricular hypertrophy of the basal-septal and infero-lateral segments. Indeterminate diastolic filling due to E-A fusion. Right Ventricle: The right ventricular size is normal. No increase in right ventricular wall thickness. Right ventricular systolic function is normal. Tricuspid regurgitation signal is inadequate for assessing PA pressure. Left Atrium: The interatrial septum buldges into the right atrium consistent with increased left atrial pressure. Left atrial size was severely dilated. Right Atrium: Right atrial size was normal in size. Pericardium: A moderately sized pericardial effusion is present. The pericardial effusion is circumferential. There is no evidence of cardiac tamponade. Mitral Valve: The mitral valve is normal in structure. No evidence of mitral valve regurgitation. No evidence of mitral valve stenosis. Tricuspid Valve: The tricuspid valve is normal in structure. Tricuspid valve regurgitation is trivial. No evidence of tricuspid stenosis. Aortic Valve: The aortic valve is tricuspid. Aortic valve regurgitation is not visualized. No aortic stenosis is present. Pulmonic Valve: The pulmonic valve was normal in structure. Pulmonic valve regurgitation is not visualized. No evidence of pulmonic stenosis. Aorta: The aortic root and ascending aorta are structurally normal, with no evidence of dilitation. Venous: The inferior vena cava is normal in size with less than 50% respiratory variability, suggesting right atrial pressure of 8 mmHg. IAS/Shunts: No atrial level shunt detected by color flow Doppler.  LEFT VENTRICLE PLAX 2D LVIDd:         3.90 cm   Diastology LVIDs:         3.00 cm   LV e' medial:  5.70 cm/s LV PW:         1.80 cm LV IVS:        1.40 cm LVOT diam:     1.90 cm LV SV:         34 LV SV Index:   20 LVOT Area:     2.84 cm  RIGHT VENTRICLE RV Basal diam:  3.70 cm RV Mid diam:    3.00 cm LEFT  ATRIUM           Index        RIGHT ATRIUM           Index LA diam:      4.70 cm 2.80 cm/m   RA Area:     15.50 cm LA Vol (A2C): 36.0 ml 21.41 ml/m  RA Volume:   36.90 ml  21.94 ml/m LA Vol (A4C): 99.9 ml 59.41 ml/m  AORTIC VALVE LVOT Vmax:   100.00 cm/s LVOT Vmean:  68.100 cm/s LVOT VTI:    0.119 m  AORTA Ao Root diam: 2.70 cm  SHUNTS Systemic VTI:  0.12 m Systemic Diam: 1.90 cm Georganna Archer Electronically signed by Georganna Archer Signature Date/Time: 09/09/2024/11:40:34 AM    Final    Medications:  anticoagulant sodium citrate       apixaban   5 mg Oral BID   atorvastatin   40 mg Oral Daily   carvedilol   6.25 mg Oral BID WC   Chlorhexidine  Gluconate Cloth  6 each Topical Q0600   Chlorhexidine  Gluconate Cloth  6 each Topical Q0600   darbepoetin (ARANESP ) injection - DIALYSIS  100 mcg Subcutaneous Q Fri-1800   doxercalciferol   2 mcg Intravenous Q T,Th,Sa-HD   insulin  aspart  0-9 Units Subcutaneous TID WC   insulin  glargine  4 Units Subcutaneous Daily   isosorbide  mononitrate  30 mg Oral Daily   nicotine   21 mg Transdermal Daily   pantoprazole   40 mg Oral Daily   sertraline   75 mg Oral Daily   sodium chloride  flush  3 mL Intravenous Q12H   traZODone   50 mg Oral QHS    Dialysis Orders TTS - [place] 4 hr, 2K/2.5Ca bath, EDW 60kg, LUE AVF Last OP HD today 12/31 short HD 2 hr due to witnessed seizure. Mircera 100 mcg 12/13 Hectoral 3 mcg three times per week (PTH 318)   Assessment/Plan: Generalized seizure, new onset: head CT/brain MRI negative. Neuro consulted, no meds recommended at this time. ESRD: Continue HD on TTS schedule - HD now. HTN/volume: BP up slightly, 1.5L UFG, continue home meds. Anemia of ESRD: Hgb down to 7.1 - no bleeding noted, continue Aranesp  q  Friday. Secondary HPTH: CorrCa low end but now, Phos to goal. Continue VDRA. Nutrition: Alb dropping, continue ^ protein diet. Pericardial effusion: On imaging, no tamponade, ?uremic.    Izetta Boehringer, PA-C 09/11/2024, 8:47 AM  [facility]    "

## 2024-09-13 ENCOUNTER — Telehealth: Payer: Self-pay | Admitting: *Deleted

## 2024-09-13 NOTE — Transitions of Care (Post Inpatient/ED Visit) (Signed)
" ° °  09/13/2024  Name: Stacey Lucas MRN: [id number] DOB: 1975/11/08  Today's TOC FU Call Status: Today's TOC FU Call Status:: Unsuccessful Call (1st Attempt) Unsuccessful Call (1st Attempt) Date: 09/13/24  Attempted to reach the patient regarding the most recent Inpatient/ED visit.  Follow Up Plan: Additional outreach attempts will be made to reach the patient to complete the Transitions of Care (Post Inpatient/ED visit) call.   Andrea Dimes RN, BSN [place]  [facility] RN Care Manager [phone]  "

## 2024-09-14 ENCOUNTER — Telehealth: Payer: Self-pay

## 2024-09-14 NOTE — Transitions of Care (Post Inpatient/ED Visit) (Signed)
" ° °  09/14/2024  Name: Stacey Lucas MRN: [id number] DOB: 01/04/1976  Today's TOC FU Call Status: Today's TOC FU Call Status:: Unsuccessful Call (2nd Attempt) Unsuccessful Call (2nd Attempt) Date: 09/14/24  Attempted to reach the patient regarding the most recent Inpatient/ED visit.  Unable to leave a voicemail message at the phone number listed as preferred.  Follow Up Plan: Additional outreach attempts will be made to reach the patient to complete the Transitions of Care (Post Inpatient/ED visit) call.   Richerd Fish, RN, BSN, CCM [facility], [facility] Management Coordinator Direct Dial: [phone]        "

## 2024-09-15 ENCOUNTER — Emergency Department (HOSPITAL_COMMUNITY)

## 2024-09-15 ENCOUNTER — Encounter (HOSPITAL_COMMUNITY): Payer: Self-pay

## 2024-09-15 ENCOUNTER — Other Ambulatory Visit: Payer: Self-pay

## 2024-09-15 ENCOUNTER — Emergency Department (HOSPITAL_COMMUNITY)
Admission: EM | Admit: 2024-09-15 | Discharge: 2024-09-15 | Disposition: A | Discharge status: Home / Self Care | Attending: Emergency Medicine | Admitting: Emergency Medicine

## 2024-09-15 DIAGNOSIS — Z79899 Other long term (current) drug therapy: Secondary | ICD-10-CM | POA: Insufficient documentation

## 2024-09-15 DIAGNOSIS — R0602 Shortness of breath: Secondary | ICD-10-CM | POA: Diagnosis present

## 2024-09-15 DIAGNOSIS — E1122 Type 2 diabetes mellitus with diabetic chronic kidney disease: Secondary | ICD-10-CM | POA: Diagnosis not present

## 2024-09-15 DIAGNOSIS — Z7901 Long term (current) use of anticoagulants: Secondary | ICD-10-CM | POA: Insufficient documentation

## 2024-09-15 DIAGNOSIS — Z992 Dependence on renal dialysis: Secondary | ICD-10-CM | POA: Insufficient documentation

## 2024-09-15 DIAGNOSIS — N186 End stage renal disease: Secondary | ICD-10-CM | POA: Diagnosis not present

## 2024-09-15 DIAGNOSIS — I120 Hypertensive chronic kidney disease with stage 5 chronic kidney disease or end stage renal disease: Secondary | ICD-10-CM | POA: Diagnosis not present

## 2024-09-15 DIAGNOSIS — I4891 Unspecified atrial fibrillation: Secondary | ICD-10-CM | POA: Insufficient documentation

## 2024-09-15 DIAGNOSIS — D631 Anemia in chronic kidney disease: Secondary | ICD-10-CM | POA: Insufficient documentation

## 2024-09-15 DIAGNOSIS — Z794 Long term (current) use of insulin: Secondary | ICD-10-CM | POA: Insufficient documentation

## 2024-09-15 LAB — BASIC METABOLIC PANEL WITH GFR
Anion gap: 16 — ABNORMAL HIGH (ref 5–15)
BUN: 55 mg/dL — ABNORMAL HIGH (ref 6–20)
CO2: 26 mmol/L (ref 22–32)
Calcium: 7.8 mg/dL — ABNORMAL LOW (ref 8.9–10.3)
Chloride: 98 mmol/L (ref 98–111)
Creatinine, Ser: 10.6 mg/dL — ABNORMAL HIGH (ref 0.44–1.00)
GFR, Estimated: 4 mL/min — ABNORMAL LOW
Glucose, Bld: 136 mg/dL — ABNORMAL HIGH (ref 70–99)
Potassium: 5.3 mmol/L — ABNORMAL HIGH (ref 3.5–5.1)
Sodium: 140 mmol/L (ref 135–145)

## 2024-09-15 LAB — PROTIME-INR
INR: 1.1 (ref 0.8–1.2)
Prothrombin Time: 14.5 s (ref 11.4–15.2)

## 2024-09-15 LAB — CBC
HCT: 21.5 % — ABNORMAL LOW (ref 36.0–46.0)
Hemoglobin: 7 g/dL — ABNORMAL LOW (ref 12.0–15.0)
MCH: 26.4 pg (ref 26.0–34.0)
MCHC: 32.6 g/dL (ref 30.0–36.0)
MCV: 81.1 fL (ref 80.0–100.0)
Platelets: 239 K/uL (ref 150–400)
RBC: 2.65 MIL/uL — ABNORMAL LOW (ref 3.87–5.11)
RDW: 17.2 % — ABNORMAL HIGH (ref 11.5–15.5)
WBC: 10.9 K/uL — ABNORMAL HIGH (ref 4.0–10.5)
nRBC: 0 % (ref 0.0–0.2)

## 2024-09-15 LAB — POC OCCULT BLOOD, ED: Fecal Occult Bld: NEGATIVE

## 2024-09-15 LAB — PRO BRAIN NATRIURETIC PEPTIDE: Pro Brain Natriuretic Peptide: >35000 pg/mL — ABNORMAL HIGH

## 2024-09-15 MED ORDER — ISOSORBIDE MONONITRATE ER 30 MG PO TB24
30.0000 mg | ORAL_TABLET | Freq: Once | ORAL | Status: AC
  Administered 2024-09-15: 30 mg via ORAL
  Filled 2024-09-15: qty 1

## 2024-09-15 MED ORDER — HYDRALAZINE HCL 25 MG PO TABS
25.0000 mg | ORAL_TABLET | Freq: Once | ORAL | Status: AC
  Administered 2024-09-15: 25 mg via ORAL
  Filled 2024-09-15: qty 1

## 2024-09-15 NOTE — ED Provider Notes (Signed)
 " [place] EMERGENCY DEPARTMENT AT [facility] Provider Note   CSN: [id number] Arrival date & time: 09/15/24  9696     Patient presents with: Shortness of Breath   Stacey Lucas is a 49 y.o. female.   Patient presents to the ED for shortness of breath after missing dialysis.  Patient states that last dialysis session was 09/14/2024, however she has not gone to dialysis since 09/11/2024.  Initial presentation 89% on room air, increased to 98% on 3 L.  During my encounter, patient was satting 100% on room air.  Stated the reason that she missed dialysis was because she could not get a ride.  Shortness of breath started happening while she was watching TV.  She stated that this has happened before when she is missed dialysis in the past.  Patient denies any sick contacts, cough, wheezing, history of blood clots, swelling in her legs, fever, chills, change in bowel habits, nausea, vomiting.  Patient stated that she does not make much urine at baseline, but she has not noticed any hematuria, frank blood in stool, or melena.  Patient stated that she has not taken her medications since last being in the hospital including her Eliquis .    The history is provided by the patient.  Shortness of Breath      Prior to Admission medications  Medication Sig Start Date End Date Taking? Authorizing Provider  amLODipine  (NORVASC ) 10 MG tablet Take 1 tablet (10 mg total) by mouth daily. 09/11/24 12/10/24  Leotis Bogus, MD  apixaban  (ELIQUIS ) 2.5 MG TABS tablet Take 1 tablet (2.5 mg total) by mouth 2 (two) times daily. Patient not taking: Reported on 08/06/2024 11/12/23   Newlin, Enobong, MD  carvedilol  (COREG ) 6.25 MG tablet Take 1 tablet (6.25 mg total) by mouth 2 (two) times daily with a meal. 09/11/24 10/11/24  Leotis Bogus, MD  hydrALAZINE  (APRESOLINE ) 25 MG tablet Take 1 tablet (25 mg total) by mouth 3 (three) times daily. 09/11/24 11/10/24  Leotis Bogus, MD  insulin  glargine (LANTUS ) 100 UNIT/ML  injection Inject 0.08 mLs (8 Units total) into the skin daily. Patient not taking: Reported on 09/08/2024 08/14/24   Singh, Prashant K, MD  isosorbide  mononitrate (IMDUR ) 30 MG 24 hr tablet Take 1 tablet (30 mg total) by mouth daily. Patient not taking: Reported on 09/08/2024 08/14/24   Dennise Lavada POUR, MD    Allergies: Neurontin  [gabapentin ]    Review of Systems  Respiratory:  Positive for shortness of breath.     Updated Vital Signs BP (!) 149/93 (BP Location: Right Arm)   Pulse 90   Temp 97.9 F (36.6 C)   Resp 20   Ht 5' 5 (1.651 m)   Wt 72.6 kg   LMP 10/11/2019   SpO2 100%   BMI 26.63 kg/m   Physical Exam Constitutional:      General: She is not in acute distress.    Appearance: She is not ill-appearing, toxic-appearing or diaphoretic.  Cardiovascular:     Rate and Rhythm: Normal rate and regular rhythm.     Heart sounds: Murmur (Continuous murmur present.) heard.     No friction rub. No gallop.     Comments: palpable fistula present on left upper arm with bruit Pulmonary:     Effort: Pulmonary effort is normal. No tachypnea, accessory muscle usage or respiratory distress.     Breath sounds: Normal breath sounds. No decreased breath sounds, wheezing, rhonchi or rales.  Chest:     Chest wall:  No tenderness.  Abdominal:     General: Bowel sounds are normal.     Palpations: Abdomen is soft.     Tenderness: There is no abdominal tenderness. There is no guarding.  Musculoskeletal:     Right lower leg: No edema.     Left lower leg: No edema.  Skin:    General: Skin is warm and dry.  Neurological:     Mental Status: She is alert.     (all labs ordered are listed, but only abnormal results are displayed) Labs Reviewed  BASIC METABOLIC PANEL WITH GFR - Abnormal; Notable for the following components:      Result Value   Potassium 5.3 (*)    Glucose, Bld 136 (*)    BUN 55 (*)    Creatinine, Ser 10.60 (*)    Calcium  7.8 (*)    GFR, Estimated 4 (*)    Anion gap  16 (*)    All other components within normal limits  CBC - Abnormal; Notable for the following components:   WBC 10.9 (*)    RBC 2.65 (*)    Hemoglobin 7.0 (*)    HCT 21.5 (*)    RDW 17.2 (*)    All other components within normal limits  PRO BRAIN NATRIURETIC PEPTIDE - Abnormal; Notable for the following components:   Pro Brain Natriuretic Peptide >35,000.0 (*)    All other components within normal limits  PROTIME-INR    EKG: None  Radiology: DG Chest 2 View Result Date: 09/15/2024 EXAM: 2 VIEW(S) XRAY OF THE CHEST 09/15/2024 03:40:00 AM COMPARISON: 09/08/2024 CLINICAL HISTORY: SOB FINDINGS: LUNGS AND PLEURA: Vascular congestion. No overt edema. No pleural effusion. No pneumothorax. HEART AND MEDIASTINUM: Cardiomegaly. BONES AND SOFT TISSUES: No acute osseous abnormality. IMPRESSION: 1. Cardiomegaly and vascular congestion without overt edema. Electronically signed by: Franky Crease MD 09/15/2024 03:51 AM EST RP Workstation: [id number]     Procedures   Medications Ordered in the ED - No data to display                                  Medical Decision Making This patient is a 49 y.o. female who presents to the ED for concern of shortness of breath, this involves an extensive number of treatment options, and is a complaint that carries with it a high risk of complications and morbidity. The emergent differential diagnosis prior to evaluation includes, but is not limited to, ACS, PE, uremia, heart failure, volume overload, pneumonia. This is not an exhaustive differential.   Past Medical History / Co-morbidities / Social History: ESRD, A-fib, Type 2 diabetes, essential hypertension, anemia 2/2 chronic kidney disease, history of CVAs, cardiac murmur  Additional history: Chart reviewed. Pertinent results include:  Recently discharged from the hospital 09/09/2024 due to new onset witnessed seizure during dialysis. Nephrology consulted for hypocalcemia leading to seizures and for  continuation of hemodialysis. Patient was recently discharged from nursing home 2 weeks ago. She lives with her mom who is  not able to take care of herself. Patient has not been taking her medications. Patient was admitted for further evaluation.  Neurologist has not recommended an antiseizure medications.  Calcium  has improved.  Patient was continued on Eliquis  and continued on hemodialysis.  Physical Exam: Physical exam performed. The pertinent findings include:  Patient is alert, in no acute distress, saturating 100% on room air.  Lungs clear to auscultation bilaterally.  Cardiac exam  significant for cardiac murmur.  Left upper arm palpable fistula with bruit present.  No lower extremity edema.  Lab Tests: I ordered, and personally interpreted labs. The pertinent results include:  - BMP: NA 140, K5.3, chloride 98, CO2 26, anion gap 16, BUN 55, creatinine 10.6, calcium  7.8 - CBC: Leukocytosis at 10.9, hemoglobin 7 (hemoglobin 09/09/2024 9.2-->09/10/2024 7.1-->10/08/2024 7.9) - Pro time INR: Prothrombin time 14.5, INR 1.1 - Pro BNP: >35,000  Imaging Studies: I ordered imaging studies including and I independently visualized and interpreted imaging - I agree with the radiologist interpretation. Below are the tests ordered and my interpretations: -Chest x-ray: Cardiomegaly with vascular congestion  EKG/Cardiac Monitoring:  My attending physician Dr. Bari viewed and interpreted the EKG which showed an underlying rhythm of: Sinus rhythm. I agree with this interpretation.  Medications: I ordered medication including -Imdur  30 mg for hypertension prior to discharge, nonadherent to home medications. -Hydralazine  25 mg once for hypertension prior to discharge, nonadherent to home medications.  Consultations Obtained: I requested consultation with the nephrology, and discussed lab and imaging findings as well as pertinent plan - they recommend: Outpatient dialysis  MDM: - Shortness of  breath: Likely 2/2 volume overload with missing dialysis sessions.  Stated that last dialysis session was last Saturday, 09/11/2024.  Patient stated that she has had similar presentation in the past when she is missed dialysis.  Initial examination, patient was satting 89% on room air and required 3 L nasal cannula to reach 98%.  During my examination, patient was 100% on room air.  On reexaminations patient stated that she was not having significant shortness of breath, shortness of breath that improved, and was still satting on room air.  Differential diagnosis included PE (no tachycardia, no lower extremity swelling, no chest pain, unlikely at this time), new onset heart failure (echo performed 09/09/2024 showed EF of 55%, there was severe left ventricular hypertrophy and increased left atrial pressure, likely contributing to elevated proBNP at >35,000). -Nonadherence to dialysis: BMP with potassium 5.3, BUN 55, creatinine 7.6, calcium  7.8.  EKG unremarkable.  Other than shortness of breath, grossly negative review of systems.  Consulted nephrology who stated that she can be seen for outpatient dialysis tomorrow.  Return precautions discussed. -Anemia: Hemoglobin 7.0 today.  Last hospitalization downtrended from 9-7 as well.  No transfusion given.  Likely 2/2 chronic kidney disease.  Patient denies any overt blood loss (no melena, blood in stool hematuria). fecal occult blood test negative.  Patient not adherent to Eliquis , last took medication during hospitalization.  Due to negative fecal occult blood test, encouraged adherence to Eliquis . -HTN: Patient's blood pressure had systolic >200.  Patient was asymptomatic for chest pain, headache, change in vision.  Nonadherent to home medications.  Hydralazine  and Imdur  given prior to discharge.  Likely 2/2 missing dialysis.  Encouraged adherence outpatient with dialysis including being dialyzed tomorrow.    Patient stated understanding of the above listed and had no  further questions.  I discussed this case with my attending physician Dr. Bari.  Who cosigned this note including patient's presenting symptoms, physical exam, and planned diagnostics and interventions. Attending physician stated agreement with plan or made changes to plan which were implemented.      Amount and/or Complexity of Data Reviewed Labs: ordered. Radiology: ordered.  Risk Prescription drug management.        Final diagnoses:  None    ED Discharge Orders     None  Stacey Lucas, Odin, Stacey Lucas 09/15/24 1849    Bari Roxie HERO, Stacey Lucas 09/15/24 2333  "

## 2024-09-15 NOTE — Discharge Instructions (Addendum)
 Please make sure you go to dialysis tomorrow.  Continue taking your medications as prescribed including your Eliquis . Please return to the emergency department if you have any increased shortness of breath, confusion, chest pain, palpitations, worsening of your symptoms.  Please follow-up your primary care.

## 2024-09-15 NOTE — ED Notes (Signed)
 Pt given 2 cranberry juices with ice.

## 2024-09-15 NOTE — ED Provider Triage Note (Signed)
 Emergency Medicine Provider Triage Evaluation Note  Stacey Lucas , a 49 y.o. female  was evaluated in triage.  Pt complains of shob. Missed dialysis today. Normally goes T, Th, sat  Review of Systems  Positive: See hpi Negative:   Physical Exam  BP (!) 177/76 (BP Location: Right Arm)   Pulse 99   Temp 99.3 F (37.4 C) (Oral)   Resp 18   Ht 5' 5 (1.651 m)   Wt 72.6 kg   LMP 10/11/2019   SpO2 100%   BMI 26.63 kg/m  Gen:   Awake, no distress   Resp:  Normal effort  MSK:   Moves extremities without difficulty  Other:  LUE fistula with palpable thrill. No pedal edema  Medical Decision Making  Medically screening exam initiated at 4:20 AM.  Appropriate orders placed.  Stacey Lucas was informed that the remainder of the evaluation will be completed by another provider, this initial triage assessment does not replace that evaluation, and the importance of remaining in the ED until their evaluation is complete.  Labs ordered CXR with vascular congestion  Noted to be 89% RA with CMS. 96%RA in triage with good pleth. Not currently requiring O2   Stacey Lucas, Stacey Lucas 09/15/24 [phone]

## 2024-09-15 NOTE — ED Notes (Signed)
 Pt states she needed to use the bathroom. Pt asked to be wheeled to the bathroom.

## 2024-09-15 NOTE — ED Notes (Addendum)
 Pt given 2 cranberry juices with ice, 2 packs of saltine crackers, 1 pack of graham crackers, and 1 packet of peanut butter.

## 2024-09-15 NOTE — ED Triage Notes (Signed)
 Patient BIB EMS from home with complaint of SOB & missing scheduled dialysis appointment on 09/14/24.   A & O x 4  HR 102 89% RA/98% 3 L  202/105

## 2024-09-15 NOTE — ED Notes (Signed)
 Per Rihner, Emilie, DO repeat BP. NT made aware

## 2024-10-01 ENCOUNTER — Encounter (HOSPITAL_COMMUNITY): Admission: RE | Payer: Self-pay | Source: Home / Self Care

## 2024-10-04 ENCOUNTER — Ambulatory Visit (HOSPITAL_COMMUNITY): Admission: RE | Admit: 2024-10-04 | Admitting: Vascular Surgery

## 2024-10-05 ENCOUNTER — Encounter (HOSPITAL_COMMUNITY): Payer: Self-pay

## 2024-10-08 ENCOUNTER — Ambulatory Visit: Payer: Self-pay | Admitting: *Deleted

## 2024-10-11 ENCOUNTER — Ambulatory Visit (HOSPITAL_COMMUNITY): Admission: RE | Admit: 2024-10-11 | Admitting: Vascular Surgery

## 2024-10-11 ENCOUNTER — Encounter (HOSPITAL_COMMUNITY): Admission: RE | Payer: Self-pay

## 2024-10-14 ENCOUNTER — Encounter (HOSPITAL_COMMUNITY): Payer: Self-pay | Admitting: Vascular Surgery

## 2024-10-15 ENCOUNTER — Other Ambulatory Visit: Payer: Self-pay

## 2024-10-15 ENCOUNTER — Encounter (HOSPITAL_COMMUNITY): Admission: RE | Disposition: A | Payer: Self-pay | Source: Home / Self Care | Attending: Surgery

## 2024-10-15 ENCOUNTER — Encounter (HOSPITAL_COMMUNITY): Payer: Self-pay | Admitting: Vascular Surgery

## 2024-10-15 ENCOUNTER — Ambulatory Visit (HOSPITAL_COMMUNITY)
Admission: RE | Admit: 2024-10-15 | Discharge: 2024-10-15 | Disposition: A | Source: Home / Self Care | Attending: Surgery | Admitting: Surgery

## 2024-10-15 LAB — GLUCOSE, CAPILLARY: Glucose-Capillary: 155 mg/dL — ABNORMAL HIGH (ref 70–99)

## 2024-10-15 MED ORDER — LIDOCAINE HCL (PF) 1 % IJ SOLN
INTRAMUSCULAR | Status: DC | PRN
  Administered 2024-10-15: 2 mL via INTRADERMAL

## 2024-10-15 MED ORDER — HEPARIN (PORCINE) IN NACL 1000-0.9 UT/500ML-% IV SOLN
INTRAVENOUS | Status: DC | PRN
  Administered 2024-10-15: 500 mL

## 2024-10-15 MED ORDER — IODIXANOL 320 MG/ML IV SOLN
INTRAVENOUS | Status: DC | PRN
  Administered 2024-10-15: 35 mL

## 2024-10-15 MED ORDER — LIDOCAINE HCL (PF) 1 % IJ SOLN
INTRAMUSCULAR | Status: AC
  Filled 2024-10-15: qty 30

## 2024-10-15 NOTE — Op Note (Signed)
" ° ° °  Patient name: Stacey Lucas MRN: [id number] DOB: 24-Oct-1975 Sex: female  10/15/2024 Pre-operative Diagnosis: Prolonged bleeding left arm AV fistula in setting of ESRD Post-operative diagnosis:  Same Surgeon:  Lonni DOROTHA Gaskins, MD Procedure Performed: 1.  Ultrasound-guided access left arm AV fistula 2.  Left upper extremity fistulogram including central venogram 3.  Balloon angioplasty left upper arm brachiocephalic AVF including the arch and mid upper arm fistula (7 mm, 9 mm, and 10 mm Mustang)  Indications: Patient is a 49 year old female with end-stage renal disease using a left brachiocephalic AV fistula.  She presents for left arm fistulogram due to prolonged bleeding after risk-benefits discussed.  Findings:   Ultrasound-guided access left brachiocephalic AV fistula.  There were two areas of stenosis one in the cephalic arch greater than 80% and another in the mid upper arm fistula with about a 60% stenosis.  These two segments were treated with a 7 mm, 9 mm, and 10 mm Mustang.  Better thrill.  No significant residual stenosis.  Patent fistula at completion.   Procedure:  The patient was identified in the holding area and taken to [facility] lab.  Placed on the table supine position.  Left arm was prepped draped standard sterile fashion.  Timeout performed.  Injected 1% lidocaine  in the skin without epinephrine .  Then accessed the fistula ultrasound-guidance with micro access needle placed a microwire and micro sheath.  Then got a left extremity fistulogram including central venogram..  Elected for intervention used a Bentson wire changed for a short 6 French sheath.  The lesion was crossed with a Bentson wire and the mid upper arm and cephalic arch stenosis was treated with a 7 mm x 80 mm Mustang to nominal pressure for 2 minutes in each segment and then I upsized to an 9 mm x 80 mm staying for 2 minutes in each segment.  We did get reflux shot that showed no other stenosis around the  arterial anastomosis.  Elected to treat the arch with a slightly larger balloon used a 10 mm x 80 mm Mustang to nominal pressure for 2 minutes.  Widely patent fistula completion with good thrill.  A pursetring was tired around the sheath with 4-0 Monocryl and this was removed.    Lonni DOROTHA Gaskins, MD [facility] [place] Office: [phone]   "

## 2024-10-15 NOTE — H&P (Signed)
 " H&P       History of Present Illness: This is a 49 y.o. female with ESRD that presents for left arm fistulogram due to prolonged bleeding.  Has a left brachiocephalic AVF placed 03/17/23 by Dr. Lanis.    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  12/29/2021   Anxiety and depression    Atrial fibrillation (HCC)    Cerebral thrombosis with cerebral infarction 05/24/2021   Cocaine abuse (HCC)    Depression with suicidal ideation    [facility] admission 04/2018   Diabetes mellitus without complication (HCC)    Type II   End stage kidney disease (HCC)    GERD (gastroesophageal reflux disease)    Hyperlipidemia    Hypertension    Impingement syndrome of right shoulder    Migraine headache    Osteoarthritis of right knee 05/24/2021   Pilonidal abscess 05/2013   Pyelonephritis    Right thyroid nodule    Sciatica     Past Surgical History:  Procedure Laterality Date   AV FISTULA PLACEMENT Left 03/17/2023   Procedure: LEFT ARM BRACHIOCEPHALIC ARTERIOVENOUS (AV) FISTULA CREATION;  Surgeon: Lanis Fonda BRAVO, MD;  Location: [facility] OR;  Service: Vascular;  Laterality: Left;   BUBBLE STUDY  01/01/2022   Procedure: BUBBLE STUDY;  Surgeon: Alvan Ronal BRAVO, MD;  Location: [facility] ENDOSCOPY;  Service: Cardiovascular;;   IR FLUORO GUIDE CV LINE RIGHT  03/14/2023   IR US  GUIDE VASC ACCESS RIGHT  03/14/2023   TEE WITHOUT CARDIOVERSION N/A 01/01/2022   Procedure: TRANSESOPHAGEAL ECHOCARDIOGRAM (TEE);  Surgeon: Alvan Ronal BRAVO, MD;  Location: [facility] ENDOSCOPY;  Service: Cardiovascular;  Laterality: N/A;   tubal     TUBAL LIGATION      Allergies[1]  Prior to Admission medications  Medication Sig Start Date End Date Taking? Authorizing Provider  amLODipine  (NORVASC ) 10 MG tablet Take 1 tablet (10 mg total) by mouth daily. 09/11/24 12/10/24  Leotis Bogus, MD  apixaban  (ELIQUIS ) 2.5 MG TABS tablet Take 1 tablet  (2.5 mg total) by mouth 2 (two) times daily. Patient not taking: Reported on 08/06/2024 11/12/23   Newlin, Enobong, MD  carvedilol  (COREG ) 6.25 MG tablet Take 1 tablet (6.25 mg total) by mouth 2 (two) times daily with a meal. 09/11/24 10/11/24  Leotis Bogus, MD  hydrALAZINE  (APRESOLINE ) 25 MG tablet Take 1 tablet (25 mg total) by mouth 3 (three) times daily. 09/11/24 11/10/24  Leotis Bogus, MD  insulin  glargine (LANTUS ) 100 UNIT/ML injection Inject 0.08 mLs (8 Units total) into the skin daily. Patient not taking: Reported on 09/08/2024 08/14/24   Singh, Prashant K, MD  isosorbide  mononitrate (IMDUR ) 30 MG 24 hr tablet Take 1 tablet (30 mg total) by mouth daily. Patient not taking: Reported on 09/08/2024 08/14/24   Dennise Lavada POUR, MD    Social History   Socioeconomic History   Marital status: Single    Spouse name: Not on file   Number of children: 3   Years of education: Not on file   Highest education level: Not on file  Occupational History   Not on file  Tobacco Use   Smoking status: Every Day    Current packs/day: 0.00    Average packs/day: 0.3 packs/day    Types: Cigarettes    Last attempt to quit: 01/16/2022    Years since quitting: 2.7    Passive exposure: Current   Smokeless tobacco: Never  Vaping Use  Vaping status: Never Used  Substance and Sexual Activity   Alcohol use: Yes    Comment: Socially   Drug use: No    Comment: Denies despite h/o cocaine use   Sexual activity: Yes    Birth control/protection: None  Other Topics Concern   Not on file  Social History Narrative   Patient reports she lives with her boyfriend. She currently is disabled and doesn't work.    Social Drivers of Health   Tobacco Use: High Risk (09/15/2024)   Patient History    Smoking Tobacco Use: Every Day    Smokeless Tobacco Use: Never    Passive Exposure: Current  Financial Resource Strain: Low Risk (06/23/2024)   Received from [facility]   Overall Financial Resource Strain (CARDIA)     How hard is it for you to pay for the very basics like food, housing, medical care, and heating?: Not very hard  Food Insecurity: Food Insecurity Present (09/10/2024)   Epic    Worried About Programme Researcher, Broadcasting/film/video in the Last Year: Often true    Ran Out of Food in the Last Year: Often true  Transportation Needs: Unmet Transportation Needs (09/10/2024)   Epic    Lack of Transportation (Medical): Yes    Lack of Transportation (Non-Medical): Yes  Physical Activity: Not on file  Stress: Stress Concern Present (03/20/2023)   [employer] of Occupational Health - Occupational Stress Questionnaire    Feeling of Stress : Rather much  Social Connections: Moderately Integrated (02/17/2024)   Social Connection and Isolation Panel    Frequency of Communication with Friends and Family: Once a week    Frequency of Social Gatherings with Friends and Family: Twice a week    Attends Religious Services: Never    Database Administrator or Organizations: Yes    Attends Banker Meetings: Never    Marital Status: Living with partner  Intimate Partner Violence: Not At Risk (09/10/2024)   Epic    Fear of Current or Ex-Partner: No    Emotionally Abused: No    Physically Abused: No    Sexually Abused: No  Depression (PHQ2-9): Low Risk (02/17/2024)   Depression (PHQ2-9)    PHQ-2 Score: 0  Alcohol Screen: Not on file  Housing: High Risk (09/10/2024)   Epic    Unable to Pay for Housing in the Last Year: Yes    Number of Times Moved in the Last Year: 1    Homeless in the Last Year: Yes  Utilities: At Risk (09/10/2024)   Epic    Threatened with loss of utilities: Yes  Health Literacy: Not on file     Family History  Problem Relation Age of Onset   Diabetes Mother    Hypertension Mother    Arthritis Mother    Diabetes Father    Hypertension Father     ROS: [x]  Positive   [ ]  Negative   [ ]  All sytems reviewed and are negative  Cardiovascular: []  chest pain/pressure []  palpitations []   SOB lying flat []  DOE []  pain in legs while walking []  pain in legs at rest []  pain in legs at night []  non-healing ulcers []  hx of DVT []  swelling in legs  Pulmonary: []  productive cough []  asthma/wheezing []  home O2  Neurologic: []  weakness in []  arms []  legs []  numbness in []  arms []  legs []  hx of CVA []  mini stroke [] difficulty speaking or slurred speech []  temporary loss of vision in one  eye []  dizziness  Hematologic: []  hx of cancer []  bleeding problems []  problems with blood clotting easily  Endocrine:   []  diabetes []  thyroid disease  GI []  vomiting blood []  blood in stool  GU: []  CKD/renal failure []  HD--[]  M/W/F or []  T/T/S []  burning with urination []  blood in urine  Psychiatric: []  anxiety []  depression  Musculoskeletal: []  arthritis []  joint pain  Integumentary: []  rashes []  ulcers  Constitutional: []  fever []  chills   Physical Examination  Vitals:   10/15/24 0836 10/15/24 0843  BP: (!) 157/102 (!) 157/102  Pulse: 78 77  Resp: 12 16  Temp: 97.8 F (36.6 C)   SpO2: 98% 100%   There is no height or weight on file to calculate BMI.  General:  WDWN in NAD Gait: Not observed HENT: WNL, normocephalic Pulmonary: normal non-labored breathing Cardiac: regular, without  Murmurs, rubs or gallops Abdomen:  soft, NT/ND Vascular Exam/Pulses: Left brachiocephalic fistula with thrill  CBC    Component Value Date/Time   WBC 10.9 (H) 09/15/2024 0332   RBC 2.65 (L) 09/15/2024 0332   HGB 7.0 (L) 09/15/2024 0332   HGB 10.9 (L) 03/05/2022 1045   HCT 21.5 (L) 09/15/2024 0332   HCT 32.6 (L) 03/05/2022 1045   PLT 239 09/15/2024 0332   PLT 287 03/05/2022 1045   MCV 81.1 09/15/2024 0332   MCV 77 (L) 03/05/2022 1045   MCH 26.4 09/15/2024 0332   MCHC 32.6 09/15/2024 0332   RDW 17.2 (H) 09/15/2024 0332   RDW 14.0 03/05/2022 1045   LYMPHSABS 2.4 04/16/2024 1408   LYMPHSABS 3.0 03/05/2022 1045   MONOABS 0.7 04/16/2024 1408   EOSABS 0.5  04/16/2024 1408   EOSABS 0.3 03/05/2022 1045   BASOSABS 0.1 04/16/2024 1408   BASOSABS 0.0 03/05/2022 1045    BMET    Component Value Date/Time   NA 140 09/15/2024 0332   NA 143 03/05/2022 1045   K 5.3 (H) 09/15/2024 0332   CL 98 09/15/2024 0332   CO2 26 09/15/2024 0332   GLUCOSE 136 (H) 09/15/2024 0332   BUN 55 (H) 09/15/2024 0332   BUN 21 03/05/2022 1045   CREATININE 10.60 (H) 09/15/2024 0332   CALCIUM  7.8 (L) 09/15/2024 0332   GFRNONAA 4 (L) 09/15/2024 0332   GFRAA >60 04/14/2020 1335    COAGS: Lab Results  Component Value Date   INR 1.1 09/15/2024   INR 1.0 07/07/2023   INR 1.0 03/13/2023     Non-Invasive Vascular Imaging:    N/a   ASSESSMENT/PLAN: This is a 49 y.o. female  with ESRD that presents for left arm fistulogram due to prolonged bleeding.  Has a left brachiocephalic AVF placed 03/17/23 by Dr. Lanis.    Discussed plan for left arm fistulogram with possible intervention including balloon angioplasty and stent.  All questions answered.  Lonni DOROTHA Gaskins, MD [facility] [place] Office: [phone]     [1]  Allergies Allergen Reactions   Neurontin  [Gabapentin ] Other (See Comments)    Weakness  Balance impairment    "
